# Patient Record
Sex: Female | Born: 1960 | Race: Black or African American | Hispanic: No | Marital: Single | Smoking: Former smoker
Health system: Southern US, Community
[De-identification: ages and names within clinical notes are randomized; demographics above are authoritative.]

## PROBLEM LIST (undated history)

## (undated) DIAGNOSIS — M549 Dorsalgia, unspecified: Secondary | ICD-10-CM

## (undated) DIAGNOSIS — J45909 Unspecified asthma, uncomplicated: Secondary | ICD-10-CM

## (undated) DIAGNOSIS — IMO0001 Reserved for inherently not codable concepts without codable children: Secondary | ICD-10-CM

## (undated) DIAGNOSIS — K219 Gastro-esophageal reflux disease without esophagitis: Secondary | ICD-10-CM

## (undated) DIAGNOSIS — T7840XA Allergy, unspecified, initial encounter: Secondary | ICD-10-CM

## (undated) DIAGNOSIS — I10 Essential (primary) hypertension: Secondary | ICD-10-CM

## (undated) DIAGNOSIS — E2749 Other adrenocortical insufficiency: Secondary | ICD-10-CM

## (undated) DIAGNOSIS — F419 Anxiety disorder, unspecified: Secondary | ICD-10-CM

## (undated) DIAGNOSIS — G43909 Migraine, unspecified, not intractable, without status migrainosus: Secondary | ICD-10-CM

## (undated) DIAGNOSIS — B1920 Unspecified viral hepatitis C without hepatic coma: Secondary | ICD-10-CM

## (undated) DIAGNOSIS — B20 Human immunodeficiency virus [HIV] disease: Secondary | ICD-10-CM

## (undated) DIAGNOSIS — J189 Pneumonia, unspecified organism: Secondary | ICD-10-CM

## (undated) DIAGNOSIS — J309 Allergic rhinitis, unspecified: Secondary | ICD-10-CM

## (undated) DIAGNOSIS — Z8619 Personal history of other infectious and parasitic diseases: Secondary | ICD-10-CM

## (undated) DIAGNOSIS — Z21 Asymptomatic human immunodeficiency virus [HIV] infection status: Secondary | ICD-10-CM

## (undated) DIAGNOSIS — D759 Disease of blood and blood-forming organs, unspecified: Secondary | ICD-10-CM

## (undated) DIAGNOSIS — E785 Hyperlipidemia, unspecified: Secondary | ICD-10-CM

## (undated) DIAGNOSIS — L509 Urticaria, unspecified: Secondary | ICD-10-CM

## (undated) DIAGNOSIS — E271 Primary adrenocortical insufficiency: Secondary | ICD-10-CM

## (undated) DIAGNOSIS — G8929 Other chronic pain: Secondary | ICD-10-CM

## (undated) DIAGNOSIS — M1990 Unspecified osteoarthritis, unspecified site: Secondary | ICD-10-CM

## (undated) DIAGNOSIS — R011 Cardiac murmur, unspecified: Secondary | ICD-10-CM

## (undated) DIAGNOSIS — Z531 Procedure and treatment not carried out because of patient's decision for reasons of belief and group pressure: Secondary | ICD-10-CM

## (undated) DIAGNOSIS — I509 Heart failure, unspecified: Secondary | ICD-10-CM

## (undated) DIAGNOSIS — D352 Benign neoplasm of pituitary gland: Secondary | ICD-10-CM

## (undated) DIAGNOSIS — F32A Depression, unspecified: Secondary | ICD-10-CM

## (undated) DIAGNOSIS — E119 Type 2 diabetes mellitus without complications: Secondary | ICD-10-CM

## (undated) DIAGNOSIS — R7303 Prediabetes: Secondary | ICD-10-CM

## (undated) HISTORY — DX: Cardiac murmur, unspecified: R01.1

## (undated) HISTORY — DX: Anxiety disorder, unspecified: F41.9

## (undated) HISTORY — PX: COLONOSCOPY: SHX174

## (undated) HISTORY — DX: Unspecified asthma, uncomplicated: J45.909

## (undated) HISTORY — DX: Hyperlipidemia, unspecified: E78.5

## (undated) HISTORY — DX: Gastro-esophageal reflux disease without esophagitis: K21.9

## (undated) HISTORY — DX: Asymptomatic human immunodeficiency virus (hiv) infection status: Z21

## (undated) HISTORY — DX: Depression, unspecified: F32.A

## (undated) HISTORY — DX: Essential (primary) hypertension: I10

## (undated) HISTORY — DX: Migraine, unspecified, not intractable, without status migrainosus: G43.909

## (undated) HISTORY — PX: COLECTOMY: SHX59

## (undated) HISTORY — PX: HAND SURGERY: SHX662

## (undated) HISTORY — DX: Other chronic pain: G89.29

## (undated) HISTORY — PX: COLOSTOMY TAKEDOWN: SHX5258

## (undated) HISTORY — DX: Prediabetes: R73.03

## (undated) HISTORY — DX: Type 2 diabetes mellitus without complications: E11.9

## (undated) HISTORY — DX: Personal history of other infectious and parasitic diseases: Z86.19

## (undated) HISTORY — DX: Human immunodeficiency virus (HIV) disease: B20

## (undated) HISTORY — DX: Allergy, unspecified, initial encounter: T78.40XA

## (undated) HISTORY — DX: Dorsalgia, unspecified: M54.9

## (undated) HISTORY — DX: Urticaria, unspecified: L50.9

## (undated) HISTORY — PX: TONSILLECTOMY: SUR1361

## (undated) HISTORY — PX: NASAL SINUS SURGERY: SHX719

## (undated) HISTORY — DX: Unspecified viral hepatitis C without hepatic coma: B19.20

## (undated) HISTORY — DX: Other adrenocortical insufficiency: E27.49

## (undated) HISTORY — PX: BUNIONECTOMY: SHX129

## (undated) HISTORY — DX: Benign neoplasm of pituitary gland: D35.2

## (undated) HISTORY — PX: GANGLION CYST EXCISION: SHX1691

## (undated) HISTORY — PX: SHOULDER SURGERY: SHX246

## (undated) HISTORY — DX: Allergic rhinitis, unspecified: J30.9

---

## 1992-04-25 ENCOUNTER — Encounter (INDEPENDENT_AMBULATORY_CARE_PROVIDER_SITE_OTHER): Payer: Self-pay | Admitting: *Deleted

## 1992-04-25 LAB — CONVERTED CEMR LAB: CD4 T Cell Abs: 200

## 1997-08-21 ENCOUNTER — Other Ambulatory Visit: Admission: RE | Admit: 1997-08-21 | Discharge: 1997-08-21 | Payer: Self-pay | Admitting: Obstetrics

## 1997-08-21 ENCOUNTER — Encounter: Admission: RE | Admit: 1997-08-21 | Discharge: 1997-08-21 | Payer: Self-pay | Admitting: Obstetrics

## 1997-08-27 ENCOUNTER — Encounter: Admission: RE | Admit: 1997-08-27 | Discharge: 1997-08-27 | Payer: Self-pay | Admitting: Hematology and Oncology

## 1997-09-17 ENCOUNTER — Encounter: Admission: RE | Admit: 1997-09-17 | Discharge: 1997-09-17 | Payer: Self-pay | Admitting: Hematology and Oncology

## 1997-09-30 ENCOUNTER — Encounter: Admission: RE | Admit: 1997-09-30 | Discharge: 1997-09-30 | Payer: Self-pay | Admitting: Hematology and Oncology

## 1997-10-07 ENCOUNTER — Encounter: Admission: RE | Admit: 1997-10-07 | Discharge: 1997-10-07 | Payer: Self-pay | Admitting: Hematology and Oncology

## 1997-10-08 ENCOUNTER — Encounter: Admission: RE | Admit: 1997-10-08 | Discharge: 1997-10-08 | Payer: Self-pay | Admitting: Internal Medicine

## 1997-10-15 ENCOUNTER — Encounter: Admission: RE | Admit: 1997-10-15 | Discharge: 1997-10-15 | Payer: Self-pay | Admitting: Hematology and Oncology

## 1997-11-13 ENCOUNTER — Emergency Department (HOSPITAL_COMMUNITY): Admission: EM | Admit: 1997-11-13 | Discharge: 1997-11-13 | Payer: Self-pay | Admitting: Emergency Medicine

## 1997-11-25 ENCOUNTER — Encounter: Admission: RE | Admit: 1997-11-25 | Discharge: 1997-11-25 | Payer: Self-pay | Admitting: Internal Medicine

## 1998-01-07 ENCOUNTER — Encounter: Admission: RE | Admit: 1998-01-07 | Discharge: 1998-01-07 | Payer: Self-pay | Admitting: Hematology and Oncology

## 1998-01-09 ENCOUNTER — Encounter: Admission: RE | Admit: 1998-01-09 | Discharge: 1998-01-09 | Payer: Self-pay | Admitting: Obstetrics & Gynecology

## 1998-04-01 ENCOUNTER — Encounter: Admission: RE | Admit: 1998-04-01 | Discharge: 1998-04-01 | Payer: Self-pay | Admitting: Internal Medicine

## 1998-06-19 ENCOUNTER — Encounter: Admission: RE | Admit: 1998-06-19 | Discharge: 1998-06-19 | Payer: Self-pay | Admitting: Internal Medicine

## 1998-11-10 ENCOUNTER — Encounter: Admission: RE | Admit: 1998-11-10 | Discharge: 1998-11-10 | Payer: Self-pay | Admitting: Obstetrics & Gynecology

## 1998-11-24 ENCOUNTER — Encounter: Admission: RE | Admit: 1998-11-24 | Discharge: 1998-11-24 | Payer: Self-pay | Admitting: Obstetrics & Gynecology

## 1999-03-15 ENCOUNTER — Encounter: Admission: RE | Admit: 1999-03-15 | Discharge: 1999-03-15 | Payer: Self-pay | Admitting: Hematology and Oncology

## 1999-03-15 ENCOUNTER — Ambulatory Visit (HOSPITAL_COMMUNITY): Admission: RE | Admit: 1999-03-15 | Discharge: 1999-03-15 | Payer: Self-pay | Admitting: Hematology and Oncology

## 1999-03-16 ENCOUNTER — Encounter: Admission: RE | Admit: 1999-03-16 | Discharge: 1999-03-16 | Payer: Self-pay | Admitting: Internal Medicine

## 1999-03-29 ENCOUNTER — Encounter: Admission: RE | Admit: 1999-03-29 | Discharge: 1999-03-29 | Payer: Self-pay | Admitting: Internal Medicine

## 1999-04-29 ENCOUNTER — Encounter: Admission: RE | Admit: 1999-04-29 | Discharge: 1999-04-29 | Payer: Self-pay | Admitting: Hematology and Oncology

## 1999-05-14 ENCOUNTER — Encounter: Admission: RE | Admit: 1999-05-14 | Discharge: 1999-05-14 | Payer: Self-pay | Admitting: Internal Medicine

## 1999-09-10 ENCOUNTER — Encounter: Admission: RE | Admit: 1999-09-10 | Discharge: 1999-09-10 | Payer: Self-pay | Admitting: Hematology and Oncology

## 1999-09-13 ENCOUNTER — Ambulatory Visit (HOSPITAL_COMMUNITY): Admission: RE | Admit: 1999-09-13 | Discharge: 1999-09-13 | Payer: Self-pay | Admitting: Internal Medicine

## 1999-09-13 ENCOUNTER — Encounter: Admission: RE | Admit: 1999-09-13 | Discharge: 1999-09-13 | Payer: Self-pay | Admitting: Internal Medicine

## 1999-09-28 ENCOUNTER — Encounter: Admission: RE | Admit: 1999-09-28 | Discharge: 1999-09-28 | Payer: Self-pay | Admitting: Hematology and Oncology

## 1999-11-07 ENCOUNTER — Emergency Department (HOSPITAL_COMMUNITY): Admission: EM | Admit: 1999-11-07 | Discharge: 1999-11-07 | Payer: Self-pay | Admitting: Emergency Medicine

## 1999-11-07 ENCOUNTER — Encounter: Payer: Self-pay | Admitting: Emergency Medicine

## 1999-12-31 ENCOUNTER — Encounter: Admission: RE | Admit: 1999-12-31 | Discharge: 1999-12-31 | Payer: Self-pay | Admitting: Internal Medicine

## 2000-02-24 ENCOUNTER — Ambulatory Visit (HOSPITAL_COMMUNITY): Admission: RE | Admit: 2000-02-24 | Discharge: 2000-02-24 | Payer: Self-pay | Admitting: Hematology and Oncology

## 2000-02-24 ENCOUNTER — Encounter: Admission: RE | Admit: 2000-02-24 | Discharge: 2000-02-24 | Payer: Self-pay | Admitting: Hematology and Oncology

## 2000-03-03 ENCOUNTER — Encounter: Admission: RE | Admit: 2000-03-03 | Discharge: 2000-03-03 | Payer: Self-pay

## 2000-03-28 ENCOUNTER — Ambulatory Visit (HOSPITAL_COMMUNITY): Admission: RE | Admit: 2000-03-28 | Discharge: 2000-03-28 | Payer: Self-pay | Admitting: Hematology and Oncology

## 2000-03-28 ENCOUNTER — Encounter: Admission: RE | Admit: 2000-03-28 | Discharge: 2000-03-28 | Payer: Self-pay | Admitting: Hematology and Oncology

## 2000-04-28 ENCOUNTER — Encounter: Payer: Self-pay | Admitting: Internal Medicine

## 2000-04-28 ENCOUNTER — Inpatient Hospital Stay (HOSPITAL_COMMUNITY): Admission: EM | Admit: 2000-04-28 | Discharge: 2000-05-06 | Payer: Self-pay | Admitting: Emergency Medicine

## 2000-05-01 ENCOUNTER — Encounter: Payer: Self-pay | Admitting: Internal Medicine

## 2000-05-10 ENCOUNTER — Ambulatory Visit (HOSPITAL_COMMUNITY): Admission: RE | Admit: 2000-05-10 | Discharge: 2000-05-10 | Payer: Self-pay | Admitting: Infectious Diseases

## 2000-05-10 ENCOUNTER — Encounter: Admission: RE | Admit: 2000-05-10 | Discharge: 2000-05-10 | Payer: Self-pay | Admitting: Infectious Diseases

## 2000-06-01 ENCOUNTER — Encounter: Admission: RE | Admit: 2000-06-01 | Discharge: 2000-06-01 | Payer: Self-pay | Admitting: Obstetrics

## 2000-06-07 ENCOUNTER — Ambulatory Visit (HOSPITAL_COMMUNITY): Admission: RE | Admit: 2000-06-07 | Discharge: 2000-06-07 | Payer: Self-pay | Admitting: Infectious Diseases

## 2000-06-07 ENCOUNTER — Encounter: Admission: RE | Admit: 2000-06-07 | Discharge: 2000-06-07 | Payer: Self-pay | Admitting: Infectious Diseases

## 2000-06-22 ENCOUNTER — Encounter: Admission: RE | Admit: 2000-06-22 | Discharge: 2000-06-22 | Payer: Self-pay | Admitting: Internal Medicine

## 2000-06-27 ENCOUNTER — Encounter: Admission: RE | Admit: 2000-06-27 | Discharge: 2000-06-27 | Payer: Self-pay | Admitting: Obstetrics & Gynecology

## 2000-07-05 ENCOUNTER — Encounter: Admission: RE | Admit: 2000-07-05 | Discharge: 2000-07-05 | Payer: Self-pay | Admitting: Infectious Diseases

## 2000-09-07 ENCOUNTER — Emergency Department (HOSPITAL_COMMUNITY): Admission: EM | Admit: 2000-09-07 | Discharge: 2000-09-07 | Payer: Self-pay | Admitting: Emergency Medicine

## 2000-09-28 ENCOUNTER — Encounter: Admission: RE | Admit: 2000-09-28 | Discharge: 2000-09-28 | Payer: Self-pay | Admitting: Obstetrics

## 2000-10-07 ENCOUNTER — Encounter: Payer: Self-pay | Admitting: Emergency Medicine

## 2000-10-07 ENCOUNTER — Emergency Department (HOSPITAL_COMMUNITY): Admission: EM | Admit: 2000-10-07 | Discharge: 2000-10-07 | Payer: Self-pay | Admitting: Emergency Medicine

## 2000-11-05 ENCOUNTER — Encounter: Payer: Self-pay | Admitting: Emergency Medicine

## 2000-11-05 ENCOUNTER — Emergency Department (HOSPITAL_COMMUNITY): Admission: EM | Admit: 2000-11-05 | Discharge: 2000-11-05 | Payer: Self-pay | Admitting: Emergency Medicine

## 2000-11-29 ENCOUNTER — Encounter: Admission: RE | Admit: 2000-11-29 | Discharge: 2000-11-29 | Payer: Self-pay | Admitting: Infectious Diseases

## 2000-11-29 ENCOUNTER — Ambulatory Visit (HOSPITAL_COMMUNITY): Admission: RE | Admit: 2000-11-29 | Discharge: 2000-11-29 | Payer: Self-pay | Admitting: Infectious Diseases

## 2000-12-29 ENCOUNTER — Encounter: Admission: RE | Admit: 2000-12-29 | Discharge: 2000-12-29 | Payer: Self-pay | Admitting: Obstetrics

## 2001-02-05 ENCOUNTER — Encounter: Payer: Self-pay | Admitting: Emergency Medicine

## 2001-02-05 ENCOUNTER — Inpatient Hospital Stay (HOSPITAL_COMMUNITY): Admission: EM | Admit: 2001-02-05 | Discharge: 2001-02-13 | Payer: Self-pay | Admitting: Emergency Medicine

## 2001-02-06 ENCOUNTER — Encounter: Payer: Self-pay | Admitting: Internal Medicine

## 2001-02-21 ENCOUNTER — Encounter: Payer: Self-pay | Admitting: Infectious Diseases

## 2001-02-21 ENCOUNTER — Encounter: Admission: RE | Admit: 2001-02-21 | Discharge: 2001-02-21 | Payer: Self-pay | Admitting: Infectious Diseases

## 2001-02-21 ENCOUNTER — Ambulatory Visit (HOSPITAL_COMMUNITY): Admission: RE | Admit: 2001-02-21 | Discharge: 2001-02-21 | Payer: Self-pay | Admitting: Infectious Diseases

## 2001-03-30 ENCOUNTER — Encounter: Admission: RE | Admit: 2001-03-30 | Discharge: 2001-03-30 | Payer: Self-pay | Admitting: Infectious Diseases

## 2001-04-25 HISTORY — PX: COLECTOMY: SHX59

## 2001-04-27 ENCOUNTER — Encounter: Admission: RE | Admit: 2001-04-27 | Discharge: 2001-04-27 | Payer: Self-pay | Admitting: Infectious Diseases

## 2001-06-14 ENCOUNTER — Inpatient Hospital Stay (HOSPITAL_COMMUNITY): Admission: RE | Admit: 2001-06-14 | Discharge: 2001-06-22 | Payer: Self-pay | Admitting: Surgery

## 2001-06-14 ENCOUNTER — Encounter (INDEPENDENT_AMBULATORY_CARE_PROVIDER_SITE_OTHER): Payer: Self-pay | Admitting: Specialist

## 2001-09-05 ENCOUNTER — Encounter: Admission: RE | Admit: 2001-09-05 | Discharge: 2001-09-05 | Payer: Self-pay | Admitting: Infectious Diseases

## 2001-09-05 ENCOUNTER — Ambulatory Visit (HOSPITAL_COMMUNITY): Admission: RE | Admit: 2001-09-05 | Discharge: 2001-09-05 | Payer: Self-pay | Admitting: Infectious Diseases

## 2001-12-12 ENCOUNTER — Encounter: Admission: RE | Admit: 2001-12-12 | Discharge: 2001-12-12 | Payer: Self-pay | Admitting: Infectious Diseases

## 2001-12-14 ENCOUNTER — Encounter: Admission: RE | Admit: 2001-12-14 | Discharge: 2001-12-14 | Payer: Self-pay | Admitting: *Deleted

## 2001-12-18 ENCOUNTER — Encounter: Admission: RE | Admit: 2001-12-18 | Discharge: 2001-12-18 | Payer: Self-pay | Admitting: Infectious Diseases

## 2001-12-18 ENCOUNTER — Ambulatory Visit (HOSPITAL_COMMUNITY): Admission: RE | Admit: 2001-12-18 | Discharge: 2001-12-18 | Payer: Self-pay | Admitting: Infectious Diseases

## 2001-12-20 ENCOUNTER — Ambulatory Visit (HOSPITAL_COMMUNITY): Admission: RE | Admit: 2001-12-20 | Discharge: 2001-12-20 | Payer: Self-pay | Admitting: *Deleted

## 2002-02-22 ENCOUNTER — Encounter: Admission: RE | Admit: 2002-02-22 | Discharge: 2002-02-22 | Payer: Self-pay | Admitting: Internal Medicine

## 2002-03-11 ENCOUNTER — Ambulatory Visit (HOSPITAL_COMMUNITY): Admission: RE | Admit: 2002-03-11 | Discharge: 2002-03-11 | Payer: Self-pay | Admitting: Infectious Diseases

## 2002-03-11 ENCOUNTER — Encounter: Payer: Self-pay | Admitting: Infectious Diseases

## 2002-03-11 ENCOUNTER — Encounter: Admission: RE | Admit: 2002-03-11 | Discharge: 2002-03-11 | Payer: Self-pay | Admitting: Infectious Diseases

## 2002-03-15 ENCOUNTER — Encounter: Admission: RE | Admit: 2002-03-15 | Discharge: 2002-03-15 | Payer: Self-pay | Admitting: *Deleted

## 2002-06-05 ENCOUNTER — Encounter: Admission: RE | Admit: 2002-06-05 | Discharge: 2002-06-05 | Payer: Self-pay | Admitting: Internal Medicine

## 2002-08-08 ENCOUNTER — Encounter: Admission: RE | Admit: 2002-08-08 | Discharge: 2002-08-08 | Payer: Self-pay | Admitting: Infectious Diseases

## 2002-08-08 ENCOUNTER — Encounter: Payer: Self-pay | Admitting: Infectious Diseases

## 2002-08-29 ENCOUNTER — Encounter: Admission: RE | Admit: 2002-08-29 | Discharge: 2002-08-29 | Payer: Self-pay | Admitting: Infectious Diseases

## 2002-08-29 ENCOUNTER — Encounter: Payer: Self-pay | Admitting: Infectious Diseases

## 2002-11-04 ENCOUNTER — Encounter: Payer: Self-pay | Admitting: Infectious Diseases

## 2002-11-04 ENCOUNTER — Encounter: Admission: RE | Admit: 2002-11-04 | Discharge: 2002-11-04 | Payer: Self-pay | Admitting: Infectious Diseases

## 2002-11-04 ENCOUNTER — Ambulatory Visit (HOSPITAL_COMMUNITY): Admission: RE | Admit: 2002-11-04 | Discharge: 2002-11-04 | Payer: Self-pay | Admitting: Infectious Diseases

## 2002-11-20 ENCOUNTER — Encounter: Admission: RE | Admit: 2002-11-20 | Discharge: 2002-11-20 | Payer: Self-pay | Admitting: Internal Medicine

## 2003-02-03 ENCOUNTER — Encounter: Admission: RE | Admit: 2003-02-03 | Discharge: 2003-02-03 | Payer: Self-pay | Admitting: Infectious Diseases

## 2003-04-07 ENCOUNTER — Encounter: Payer: Self-pay | Admitting: Infectious Diseases

## 2003-04-07 ENCOUNTER — Ambulatory Visit (HOSPITAL_COMMUNITY): Admission: RE | Admit: 2003-04-07 | Discharge: 2003-04-07 | Payer: Self-pay | Admitting: Infectious Diseases

## 2003-04-07 ENCOUNTER — Encounter: Admission: RE | Admit: 2003-04-07 | Discharge: 2003-04-07 | Payer: Self-pay | Admitting: Infectious Diseases

## 2003-05-05 ENCOUNTER — Encounter: Admission: RE | Admit: 2003-05-05 | Discharge: 2003-05-05 | Payer: Self-pay | Admitting: Infectious Diseases

## 2003-06-11 ENCOUNTER — Encounter: Admission: RE | Admit: 2003-06-11 | Discharge: 2003-06-11 | Payer: Self-pay | Admitting: Infectious Diseases

## 2003-06-11 ENCOUNTER — Ambulatory Visit (HOSPITAL_COMMUNITY): Admission: RE | Admit: 2003-06-11 | Discharge: 2003-06-11 | Payer: Self-pay | Admitting: Infectious Diseases

## 2003-07-07 ENCOUNTER — Encounter: Admission: RE | Admit: 2003-07-07 | Discharge: 2003-07-07 | Payer: Self-pay | Admitting: Infectious Diseases

## 2003-08-25 ENCOUNTER — Ambulatory Visit (HOSPITAL_COMMUNITY): Admission: RE | Admit: 2003-08-25 | Discharge: 2003-08-25 | Payer: Self-pay | Admitting: Infectious Diseases

## 2003-08-25 ENCOUNTER — Encounter: Admission: RE | Admit: 2003-08-25 | Discharge: 2003-08-25 | Payer: Self-pay | Admitting: Infectious Diseases

## 2003-10-10 ENCOUNTER — Encounter: Admission: RE | Admit: 2003-10-10 | Discharge: 2003-10-10 | Payer: Self-pay | Admitting: Infectious Diseases

## 2003-11-03 ENCOUNTER — Ambulatory Visit (HOSPITAL_COMMUNITY): Admission: RE | Admit: 2003-11-03 | Discharge: 2003-11-03 | Payer: Self-pay | Admitting: Infectious Diseases

## 2003-11-03 ENCOUNTER — Encounter: Admission: RE | Admit: 2003-11-03 | Discharge: 2003-11-03 | Payer: Self-pay | Admitting: Infectious Diseases

## 2003-11-05 ENCOUNTER — Ambulatory Visit (HOSPITAL_COMMUNITY): Admission: RE | Admit: 2003-11-05 | Discharge: 2003-11-05 | Payer: Self-pay | Admitting: Infectious Diseases

## 2003-12-11 ENCOUNTER — Encounter: Admission: RE | Admit: 2003-12-11 | Discharge: 2003-12-11 | Payer: Self-pay | Admitting: Infectious Diseases

## 2004-01-05 ENCOUNTER — Ambulatory Visit: Payer: Self-pay | Admitting: Infectious Diseases

## 2004-01-14 ENCOUNTER — Ambulatory Visit: Payer: Self-pay | Admitting: Infectious Diseases

## 2004-02-23 ENCOUNTER — Ambulatory Visit (HOSPITAL_COMMUNITY): Admission: RE | Admit: 2004-02-23 | Discharge: 2004-02-23 | Payer: Self-pay | Admitting: Infectious Diseases

## 2004-02-23 ENCOUNTER — Ambulatory Visit: Payer: Self-pay | Admitting: Infectious Diseases

## 2004-04-02 ENCOUNTER — Ambulatory Visit: Payer: Self-pay | Admitting: Internal Medicine

## 2004-04-05 ENCOUNTER — Ambulatory Visit: Payer: Self-pay | Admitting: Infectious Diseases

## 2004-05-03 ENCOUNTER — Ambulatory Visit: Payer: Self-pay | Admitting: Infectious Diseases

## 2004-05-03 ENCOUNTER — Ambulatory Visit (HOSPITAL_COMMUNITY): Admission: RE | Admit: 2004-05-03 | Discharge: 2004-05-03 | Payer: Self-pay | Admitting: Infectious Diseases

## 2004-06-11 ENCOUNTER — Emergency Department (HOSPITAL_COMMUNITY): Admission: EM | Admit: 2004-06-11 | Discharge: 2004-06-11 | Payer: Self-pay | Admitting: Family Medicine

## 2004-06-21 ENCOUNTER — Ambulatory Visit: Payer: Self-pay | Admitting: Infectious Diseases

## 2004-07-06 ENCOUNTER — Ambulatory Visit: Payer: Self-pay | Admitting: Internal Medicine

## 2004-07-19 ENCOUNTER — Ambulatory Visit (HOSPITAL_COMMUNITY): Admission: RE | Admit: 2004-07-19 | Discharge: 2004-07-19 | Payer: Self-pay | Admitting: Unknown Physician Specialty

## 2004-08-05 ENCOUNTER — Emergency Department (HOSPITAL_COMMUNITY): Admission: EM | Admit: 2004-08-05 | Discharge: 2004-08-05 | Payer: Self-pay | Admitting: Family Medicine

## 2004-08-09 ENCOUNTER — Ambulatory Visit: Payer: Self-pay | Admitting: Internal Medicine

## 2004-09-08 ENCOUNTER — Ambulatory Visit (HOSPITAL_COMMUNITY): Admission: RE | Admit: 2004-09-08 | Discharge: 2004-09-08 | Payer: Self-pay | Admitting: Infectious Diseases

## 2004-09-08 ENCOUNTER — Ambulatory Visit: Payer: Self-pay | Admitting: Infectious Diseases

## 2004-11-19 ENCOUNTER — Ambulatory Visit: Payer: Self-pay | Admitting: Infectious Diseases

## 2005-01-05 ENCOUNTER — Ambulatory Visit: Payer: Self-pay | Admitting: Infectious Diseases

## 2005-01-05 ENCOUNTER — Ambulatory Visit (HOSPITAL_COMMUNITY): Admission: RE | Admit: 2005-01-05 | Discharge: 2005-01-05 | Payer: Self-pay | Admitting: Infectious Diseases

## 2005-03-07 ENCOUNTER — Encounter: Payer: Self-pay | Admitting: Infectious Diseases

## 2005-03-07 ENCOUNTER — Other Ambulatory Visit: Admission: RE | Admit: 2005-03-07 | Discharge: 2005-03-07 | Payer: Self-pay | Admitting: Infectious Diseases

## 2005-03-07 ENCOUNTER — Ambulatory Visit: Payer: Self-pay | Admitting: Infectious Diseases

## 2005-04-28 ENCOUNTER — Emergency Department (HOSPITAL_COMMUNITY): Admission: EM | Admit: 2005-04-28 | Discharge: 2005-04-28 | Payer: Self-pay | Admitting: Emergency Medicine

## 2005-04-29 ENCOUNTER — Emergency Department (HOSPITAL_COMMUNITY): Admission: EM | Admit: 2005-04-29 | Discharge: 2005-04-29 | Payer: Self-pay | Admitting: Emergency Medicine

## 2005-05-02 ENCOUNTER — Ambulatory Visit: Payer: Self-pay | Admitting: Infectious Diseases

## 2005-05-10 ENCOUNTER — Encounter (INDEPENDENT_AMBULATORY_CARE_PROVIDER_SITE_OTHER): Payer: Self-pay | Admitting: *Deleted

## 2005-05-10 ENCOUNTER — Ambulatory Visit (HOSPITAL_COMMUNITY): Admission: RE | Admit: 2005-05-10 | Discharge: 2005-05-10 | Payer: Self-pay | Admitting: Infectious Diseases

## 2005-05-10 ENCOUNTER — Ambulatory Visit: Payer: Self-pay | Admitting: Infectious Diseases

## 2005-05-10 LAB — CONVERTED CEMR LAB
CD4 Count: 500 microliters
HIV 1 RNA Quant: 399 copies/mL

## 2005-07-27 ENCOUNTER — Encounter: Admission: RE | Admit: 2005-07-27 | Discharge: 2005-07-27 | Payer: Self-pay | Admitting: Infectious Diseases

## 2005-07-27 ENCOUNTER — Encounter (INDEPENDENT_AMBULATORY_CARE_PROVIDER_SITE_OTHER): Payer: Self-pay | Admitting: *Deleted

## 2005-07-27 ENCOUNTER — Ambulatory Visit: Payer: Self-pay | Admitting: Infectious Diseases

## 2005-08-03 ENCOUNTER — Ambulatory Visit (HOSPITAL_COMMUNITY): Admission: RE | Admit: 2005-08-03 | Discharge: 2005-08-03 | Payer: Self-pay | Admitting: Infectious Diseases

## 2005-08-05 ENCOUNTER — Ambulatory Visit: Payer: Self-pay

## 2005-08-31 ENCOUNTER — Ambulatory Visit: Payer: Self-pay | Admitting: Infectious Diseases

## 2005-09-12 ENCOUNTER — Ambulatory Visit: Payer: Self-pay | Admitting: Cardiovascular Disease

## 2005-10-03 ENCOUNTER — Ambulatory Visit: Payer: Self-pay | Admitting: Cardiology

## 2005-10-05 ENCOUNTER — Ambulatory Visit: Payer: Self-pay | Admitting: Infectious Diseases

## 2005-11-07 ENCOUNTER — Ambulatory Visit: Payer: Self-pay | Admitting: Gastroenterology

## 2005-11-30 ENCOUNTER — Encounter: Admission: RE | Admit: 2005-11-30 | Discharge: 2005-11-30 | Payer: Self-pay | Admitting: Infectious Diseases

## 2005-11-30 ENCOUNTER — Ambulatory Visit: Payer: Self-pay | Admitting: Infectious Diseases

## 2005-12-01 ENCOUNTER — Ambulatory Visit: Payer: Self-pay | Admitting: Gastroenterology

## 2005-12-01 ENCOUNTER — Encounter (INDEPENDENT_AMBULATORY_CARE_PROVIDER_SITE_OTHER): Payer: Self-pay | Admitting: Specialist

## 2005-12-21 ENCOUNTER — Ambulatory Visit: Payer: Self-pay | Admitting: Gastroenterology

## 2005-12-30 ENCOUNTER — Ambulatory Visit: Payer: Self-pay | Admitting: Internal Medicine

## 2006-02-09 ENCOUNTER — Ambulatory Visit: Payer: Self-pay | Admitting: Infectious Diseases

## 2006-03-15 ENCOUNTER — Encounter: Admission: RE | Admit: 2006-03-15 | Discharge: 2006-03-15 | Payer: Self-pay | Admitting: Infectious Diseases

## 2006-03-15 ENCOUNTER — Encounter (INDEPENDENT_AMBULATORY_CARE_PROVIDER_SITE_OTHER): Payer: Self-pay | Admitting: *Deleted

## 2006-03-15 ENCOUNTER — Ambulatory Visit (HOSPITAL_COMMUNITY): Admission: RE | Admit: 2006-03-15 | Discharge: 2006-03-15 | Payer: Self-pay | Admitting: Infectious Diseases

## 2006-03-15 ENCOUNTER — Ambulatory Visit: Payer: Self-pay | Admitting: Infectious Diseases

## 2006-03-24 ENCOUNTER — Ambulatory Visit (HOSPITAL_COMMUNITY): Admission: RE | Admit: 2006-03-24 | Discharge: 2006-03-24 | Payer: Self-pay | Admitting: Internal Medicine

## 2006-04-13 ENCOUNTER — Ambulatory Visit: Payer: Self-pay | Admitting: Cardiovascular Disease

## 2006-05-08 ENCOUNTER — Ambulatory Visit: Payer: Self-pay | Admitting: Infectious Diseases

## 2006-05-09 DIAGNOSIS — J309 Allergic rhinitis, unspecified: Secondary | ICD-10-CM

## 2006-05-09 DIAGNOSIS — F32A Depression, unspecified: Secondary | ICD-10-CM | POA: Insufficient documentation

## 2006-05-09 DIAGNOSIS — B1710 Acute hepatitis C without hepatic coma: Secondary | ICD-10-CM

## 2006-05-09 DIAGNOSIS — B20 Human immunodeficiency virus [HIV] disease: Secondary | ICD-10-CM

## 2006-05-09 DIAGNOSIS — K219 Gastro-esophageal reflux disease without esophagitis: Secondary | ICD-10-CM | POA: Insufficient documentation

## 2006-05-09 DIAGNOSIS — F329 Major depressive disorder, single episode, unspecified: Secondary | ICD-10-CM

## 2006-05-09 HISTORY — DX: Allergic rhinitis, unspecified: J30.9

## 2006-06-19 ENCOUNTER — Encounter (INDEPENDENT_AMBULATORY_CARE_PROVIDER_SITE_OTHER): Payer: Self-pay | Admitting: *Deleted

## 2006-06-19 LAB — CONVERTED CEMR LAB

## 2006-06-27 ENCOUNTER — Telehealth: Payer: Self-pay | Admitting: Infectious Diseases

## 2006-06-29 ENCOUNTER — Telehealth (INDEPENDENT_AMBULATORY_CARE_PROVIDER_SITE_OTHER): Payer: Self-pay | Admitting: *Deleted

## 2006-07-02 ENCOUNTER — Encounter (INDEPENDENT_AMBULATORY_CARE_PROVIDER_SITE_OTHER): Payer: Self-pay | Admitting: *Deleted

## 2006-08-07 ENCOUNTER — Encounter: Admission: RE | Admit: 2006-08-07 | Discharge: 2006-08-07 | Payer: Self-pay | Admitting: Infectious Diseases

## 2006-08-07 ENCOUNTER — Ambulatory Visit: Payer: Self-pay | Admitting: Infectious Diseases

## 2006-08-07 ENCOUNTER — Encounter: Payer: Self-pay | Admitting: Internal Medicine

## 2006-08-07 LAB — CONVERTED CEMR LAB
AST: 29 units/L (ref 0–37)
Albumin: 4.3 g/dL (ref 3.5–5.2)
Alkaline Phosphatase: 78 units/L (ref 39–117)
Beta hcg, urine, semiquantitative: NEGATIVE
CD4 Count: 410 microliters
Calcium: 9 mg/dL (ref 8.4–10.5)
Chloride: 103 meq/L (ref 96–112)
Glucose, Bld: 105 mg/dL — ABNORMAL HIGH (ref 70–99)
HIV-1 RNA Quant, Log: 2.45 — ABNORMAL HIGH (ref <1.70)
Hemoglobin: 13.3 g/dL (ref 12.0–15.0)
LDL Cholesterol: 147 mg/dL — ABNORMAL HIGH (ref 0–99)
MCHC: 30.9 g/dL (ref 30.0–36.0)
Potassium: 3.4 meq/L — ABNORMAL LOW (ref 3.5–5.3)
RBC: 5.26 M/uL — ABNORMAL HIGH (ref 3.87–5.11)
RDW: 16.4 % — ABNORMAL HIGH (ref 11.5–14.0)
Sodium: 140 meq/L (ref 135–145)
Total Protein: 7.9 g/dL (ref 6.0–8.3)

## 2006-09-29 ENCOUNTER — Telehealth: Payer: Self-pay | Admitting: Infectious Diseases

## 2006-10-23 ENCOUNTER — Telehealth: Payer: Self-pay | Admitting: Infectious Diseases

## 2006-10-25 ENCOUNTER — Telehealth: Payer: Self-pay | Admitting: Infectious Diseases

## 2006-12-28 ENCOUNTER — Telehealth: Payer: Self-pay | Admitting: Infectious Diseases

## 2007-01-03 ENCOUNTER — Telehealth: Payer: Self-pay | Admitting: Infectious Diseases

## 2007-01-05 ENCOUNTER — Encounter: Admission: RE | Admit: 2007-01-05 | Discharge: 2007-01-05 | Payer: Self-pay | Admitting: Internal Medicine

## 2007-01-05 ENCOUNTER — Ambulatory Visit: Payer: Self-pay | Admitting: Internal Medicine

## 2007-01-05 ENCOUNTER — Encounter: Payer: Self-pay | Admitting: Infectious Diseases

## 2007-01-05 LAB — CONVERTED CEMR LAB: HIV 1 RNA Quant: 66 copies/mL — ABNORMAL HIGH (ref <50)

## 2007-01-08 LAB — CONVERTED CEMR LAB
ALT: 41 units/L — ABNORMAL HIGH (ref 0–35)
Albumin: 3.9 g/dL (ref 3.5–5.2)
Basophils Absolute: 0 10*3/uL (ref 0.0–0.1)
CO2: 22 meq/L (ref 19–32)
Chloride: 106 meq/L (ref 96–112)
Eosinophils Relative: 1 % (ref 0–5)
Hemoglobin, Urine: NEGATIVE
Lymphocytes Relative: 37 % (ref 12–46)
Neutro Abs: 4.1 10*3/uL (ref 1.7–7.7)
Neutrophils Relative %: 55 % (ref 43–77)
Platelets: 188 10*3/uL (ref 150–400)
Potassium: 3.7 meq/L (ref 3.5–5.3)
Protein, ur: NEGATIVE mg/dL
RDW: 16.8 % — ABNORMAL HIGH (ref 11.5–14.0)
Sodium: 139 meq/L (ref 135–145)
Total Bilirubin: 0.3 mg/dL (ref 0.3–1.2)
Total Protein: 7.4 g/dL (ref 6.0–8.3)
Urine Glucose: NEGATIVE mg/dL
Urobilinogen, UA: 0.2 (ref 0.0–1.0)
WBC: 7.5 10*3/uL (ref 4.0–10.5)

## 2007-01-12 ENCOUNTER — Ambulatory Visit: Payer: Self-pay | Admitting: Gastroenterology

## 2007-01-25 ENCOUNTER — Ambulatory Visit: Payer: Self-pay | Admitting: Gastroenterology

## 2007-01-25 LAB — CONVERTED CEMR LAB
ALT: 44 units/L — ABNORMAL HIGH (ref 0–35)
AST: 46 units/L — ABNORMAL HIGH (ref 0–37)
Amylase: 136 units/L — ABNORMAL HIGH (ref 27–131)
Calcium: 8.2 mg/dL — ABNORMAL LOW (ref 8.4–10.5)
Chloride: 105 meq/L (ref 96–112)
Eosinophils Relative: 2.7 % (ref 0.0–5.0)
GFR calc Af Amer: 100 mL/min
HCT: 34 % — ABNORMAL LOW (ref 36.0–46.0)
Lipase: 39 units/L (ref 11.0–59.0)
MCV: 81.9 fL (ref 78.0–100.0)
Neutrophils Relative %: 48.4 % (ref 43.0–77.0)
Potassium: 3.1 meq/L — ABNORMAL LOW (ref 3.5–5.1)
RBC: 4.15 M/uL (ref 3.87–5.11)
RDW: 14.5 % (ref 11.5–14.6)
WBC: 7.2 10*3/uL (ref 4.5–10.5)

## 2007-01-26 ENCOUNTER — Encounter (INDEPENDENT_AMBULATORY_CARE_PROVIDER_SITE_OTHER): Payer: Self-pay | Admitting: *Deleted

## 2007-02-14 ENCOUNTER — Ambulatory Visit: Payer: Self-pay | Admitting: Infectious Diseases

## 2007-02-14 DIAGNOSIS — G4700 Insomnia, unspecified: Secondary | ICD-10-CM | POA: Insufficient documentation

## 2007-03-27 ENCOUNTER — Ambulatory Visit (HOSPITAL_COMMUNITY): Admission: RE | Admit: 2007-03-27 | Discharge: 2007-03-27 | Payer: Self-pay | Admitting: Infectious Diseases

## 2007-03-27 ENCOUNTER — Telehealth: Payer: Self-pay | Admitting: Infectious Diseases

## 2007-04-23 ENCOUNTER — Encounter (INDEPENDENT_AMBULATORY_CARE_PROVIDER_SITE_OTHER): Payer: Self-pay | Admitting: *Deleted

## 2007-04-24 ENCOUNTER — Encounter (INDEPENDENT_AMBULATORY_CARE_PROVIDER_SITE_OTHER): Payer: Self-pay | Admitting: *Deleted

## 2007-05-07 ENCOUNTER — Ambulatory Visit: Payer: Self-pay | Admitting: Infectious Diseases

## 2007-05-07 ENCOUNTER — Encounter: Admission: RE | Admit: 2007-05-07 | Discharge: 2007-05-07 | Payer: Self-pay | Admitting: Infectious Diseases

## 2007-05-07 LAB — CONVERTED CEMR LAB
ALT: 22 units/L (ref 0–35)
AST: 23 units/L (ref 0–37)
Calcium: 9.8 mg/dL (ref 8.4–10.5)
Chloride: 101 meq/L (ref 96–112)
Creatinine, Ser: 0.79 mg/dL (ref 0.40–1.20)
HCT: 40.6 % (ref 36.0–46.0)
HIV 1 RNA Quant: <50 copies/mL (ref <50)
LDL Cholesterol: 127 mg/dL — ABNORMAL HIGH (ref 0–99)
Platelets: 195 10*3/uL (ref 150–400)
Potassium: 3.5 meq/L (ref 3.5–5.3)
RDW: 16 % — ABNORMAL HIGH (ref 11.5–15.5)
Total CHOL/HDL Ratio: 4.6

## 2007-05-08 ENCOUNTER — Encounter: Payer: Self-pay | Admitting: Infectious Diseases

## 2007-08-16 ENCOUNTER — Telehealth: Payer: Self-pay | Admitting: Infectious Diseases

## 2007-08-20 ENCOUNTER — Encounter: Payer: Self-pay | Admitting: Infectious Diseases

## 2007-08-20 ENCOUNTER — Encounter: Admission: RE | Admit: 2007-08-20 | Discharge: 2007-08-20 | Payer: Self-pay | Admitting: Infectious Diseases

## 2007-08-20 ENCOUNTER — Ambulatory Visit: Payer: Self-pay | Admitting: Hospitalist

## 2007-08-20 ENCOUNTER — Telehealth: Payer: Self-pay | Admitting: *Deleted

## 2007-08-20 ENCOUNTER — Ambulatory Visit (HOSPITAL_COMMUNITY): Admission: RE | Admit: 2007-08-20 | Discharge: 2007-08-20 | Payer: Self-pay | Admitting: Hospitalist

## 2007-08-20 DIAGNOSIS — J069 Acute upper respiratory infection, unspecified: Secondary | ICD-10-CM | POA: Insufficient documentation

## 2007-08-20 LAB — CONVERTED CEMR LAB
ALT: 31 units/L (ref 0–35)
AST: 35 units/L (ref 0–37)
Alkaline Phosphatase: 66 units/L (ref 39–117)
Basophils Absolute: 0.1 10*3/uL (ref 0.0–0.1)
Basophils Relative: 1 % (ref 0–1)
Creatinine, Ser: 0.73 mg/dL (ref 0.40–1.20)
Eosinophils Absolute: 0.2 10*3/uL (ref 0.0–0.7)
Lymphs Abs: 2.2 10*3/uL (ref 0.7–4.0)
MCHC: 33.7 g/dL (ref 30.0–36.0)
MCV: 79.6 fL (ref 78.0–100.0)
Neutrophils Relative %: 52 % (ref 43–77)
Platelets: 157 10*3/uL (ref 150–400)
RDW: 15.6 % — ABNORMAL HIGH (ref 11.5–15.5)
Sodium: 137 meq/L (ref 135–145)
Total Bilirubin: 0.3 mg/dL (ref 0.3–1.2)
Total Protein: 8.4 g/dL — ABNORMAL HIGH (ref 6.0–8.3)
WBC: 6.4 10*3/uL (ref 4.0–10.5)

## 2007-08-22 ENCOUNTER — Telehealth (INDEPENDENT_AMBULATORY_CARE_PROVIDER_SITE_OTHER): Payer: Self-pay | Admitting: *Deleted

## 2007-08-23 ENCOUNTER — Encounter: Payer: Self-pay | Admitting: Infectious Diseases

## 2007-09-01 ENCOUNTER — Observation Stay (HOSPITAL_COMMUNITY): Admission: EM | Admit: 2007-09-01 | Discharge: 2007-09-02 | Payer: Self-pay | Admitting: Emergency Medicine

## 2007-09-01 ENCOUNTER — Ambulatory Visit: Payer: Self-pay | Admitting: *Deleted

## 2007-09-01 ENCOUNTER — Ambulatory Visit: Payer: Self-pay | Admitting: Internal Medicine

## 2007-09-01 ENCOUNTER — Encounter (INDEPENDENT_AMBULATORY_CARE_PROVIDER_SITE_OTHER): Payer: Self-pay | Admitting: Emergency Medicine

## 2007-09-03 ENCOUNTER — Telehealth: Payer: Self-pay | Admitting: Infectious Diseases

## 2007-09-05 ENCOUNTER — Ambulatory Visit: Payer: Self-pay | Admitting: Infectious Diseases

## 2007-10-10 ENCOUNTER — Ambulatory Visit: Payer: Self-pay | Admitting: Infectious Diseases

## 2007-10-10 ENCOUNTER — Telehealth: Payer: Self-pay | Admitting: Infectious Disease

## 2007-10-29 ENCOUNTER — Telehealth: Payer: Self-pay | Admitting: Infectious Diseases

## 2007-10-31 ENCOUNTER — Encounter: Payer: Self-pay | Admitting: Infectious Diseases

## 2007-11-08 ENCOUNTER — Ambulatory Visit: Payer: Self-pay | Admitting: *Deleted

## 2007-11-08 ENCOUNTER — Encounter (INDEPENDENT_AMBULATORY_CARE_PROVIDER_SITE_OTHER): Payer: Self-pay | Admitting: *Deleted

## 2007-11-08 LAB — CONVERTED CEMR LAB
Albumin: 4.3 g/dL (ref 3.5–5.2)
BUN: 8 mg/dL (ref 6–23)
CO2: 24 meq/L (ref 19–32)
Calcium: 8.8 mg/dL (ref 8.4–10.5)
Chloride: 103 meq/L (ref 96–112)
Creatinine, Ser: 0.73 mg/dL (ref 0.40–1.20)
Potassium: 3.5 meq/L (ref 3.5–5.3)

## 2007-11-14 ENCOUNTER — Ambulatory Visit: Payer: Self-pay | Admitting: Infectious Diseases

## 2007-11-14 ENCOUNTER — Encounter (INDEPENDENT_AMBULATORY_CARE_PROVIDER_SITE_OTHER): Payer: Self-pay | Admitting: *Deleted

## 2007-11-14 LAB — CONVERTED CEMR LAB
BUN: 9 mg/dL (ref 6–23)
CO2: 23 meq/L (ref 19–32)
Calcium: 8.9 mg/dL (ref 8.4–10.5)
Chloride: 101 meq/L (ref 96–112)
Creatinine, Ser: 0.62 mg/dL (ref 0.40–1.20)
Glucose, Bld: 98 mg/dL (ref 70–99)
Potassium: 3.4 meq/L — ABNORMAL LOW (ref 3.5–5.3)
Sodium: 137 meq/L (ref 135–145)

## 2007-11-28 ENCOUNTER — Ambulatory Visit: Payer: Self-pay | Admitting: Infectious Diseases

## 2007-11-28 ENCOUNTER — Encounter: Admission: RE | Admit: 2007-11-28 | Discharge: 2007-11-28 | Payer: Self-pay | Admitting: Infectious Diseases

## 2007-11-28 LAB — CONVERTED CEMR LAB
BUN: 10 mg/dL (ref 6–23)
Basophils Absolute: 0 10*3/uL (ref 0.0–0.1)
CO2: 25 meq/L (ref 19–32)
Calcium: 8.7 mg/dL (ref 8.4–10.5)
Chloride: 104 meq/L (ref 96–112)
Creatinine, Ser: 0.58 mg/dL (ref 0.40–1.20)
Eosinophils Relative: 3 % (ref 0–5)
Glucose, Bld: 100 mg/dL — ABNORMAL HIGH (ref 70–99)
HCT: 39.6 % (ref 36.0–46.0)
HIV 1 RNA Quant: <50 copies/mL (ref <50)
HIV-1 RNA Quant, Log: <1.7 (ref <1.70)
Hemoglobin: 12.7 g/dL (ref 12.0–15.0)
Lymphocytes Relative: 43 % (ref 12–46)
Monocytes Absolute: 0.6 10*3/uL (ref 0.1–1.0)
Monocytes Relative: 10 % (ref 3–12)
Neutro Abs: 2.5 10*3/uL (ref 1.7–7.7)
RBC: 4.93 M/uL (ref 3.87–5.11)
RDW: 16.4 % — ABNORMAL HIGH (ref 11.5–15.5)
Total Bilirubin: 0.4 mg/dL (ref 0.3–1.2)

## 2008-01-03 ENCOUNTER — Telehealth (INDEPENDENT_AMBULATORY_CARE_PROVIDER_SITE_OTHER): Payer: Self-pay | Admitting: *Deleted

## 2008-01-23 ENCOUNTER — Ambulatory Visit: Payer: Self-pay | Admitting: Infectious Diseases

## 2008-01-31 ENCOUNTER — Telehealth: Payer: Self-pay | Admitting: Infectious Diseases

## 2008-02-05 ENCOUNTER — Telehealth (INDEPENDENT_AMBULATORY_CARE_PROVIDER_SITE_OTHER): Payer: Self-pay | Admitting: *Deleted

## 2008-02-21 ENCOUNTER — Telehealth: Payer: Self-pay | Admitting: Infectious Diseases

## 2008-02-26 ENCOUNTER — Encounter (INDEPENDENT_AMBULATORY_CARE_PROVIDER_SITE_OTHER): Payer: Self-pay | Admitting: *Deleted

## 2008-03-12 ENCOUNTER — Encounter: Payer: Self-pay | Admitting: Infectious Diseases

## 2008-03-24 ENCOUNTER — Ambulatory Visit: Payer: Self-pay | Admitting: Infectious Diseases

## 2008-03-24 ENCOUNTER — Encounter (INDEPENDENT_AMBULATORY_CARE_PROVIDER_SITE_OTHER): Payer: Self-pay | Admitting: Licensed Clinical Social Worker

## 2008-03-24 LAB — CONVERTED CEMR LAB: HIV 1 RNA Quant: <48 copies/mL (ref <48)

## 2008-03-25 ENCOUNTER — Telehealth: Payer: Self-pay | Admitting: Infectious Diseases

## 2008-03-25 LAB — CONVERTED CEMR LAB
ALT: 22 units/L (ref 0–35)
Alkaline Phosphatase: 120 units/L — ABNORMAL HIGH (ref 39–117)
Basophils Absolute: 0 10*3/uL (ref 0.0–0.1)
Eosinophils Absolute: 0.1 10*3/uL (ref 0.0–0.7)
Eosinophils Relative: 2 % (ref 0–5)
Glucose, Bld: 101 mg/dL — ABNORMAL HIGH (ref 70–99)
Hemoglobin: 13.2 g/dL (ref 12.0–15.0)
Lymphocytes Relative: 19 % (ref 12–46)
MCHC: 33.1 g/dL (ref 30.0–36.0)
Neutro Abs: 4.2 10*3/uL (ref 1.7–7.7)
Neutrophils Relative %: 64 % (ref 43–77)
RBC: 5.03 M/uL (ref 3.87–5.11)
Sodium: 136 meq/L (ref 135–145)
Total Bilirubin: 0.5 mg/dL (ref 0.3–1.2)
Total Protein: 8.4 g/dL — ABNORMAL HIGH (ref 6.0–8.3)
WBC: 6.5 10*3/uL (ref 4.0–10.5)

## 2008-03-26 ENCOUNTER — Ambulatory Visit: Payer: Self-pay | Admitting: Internal Medicine

## 2008-03-26 ENCOUNTER — Inpatient Hospital Stay (HOSPITAL_COMMUNITY): Admission: EM | Admit: 2008-03-26 | Discharge: 2008-03-31 | Payer: Self-pay | Admitting: Emergency Medicine

## 2008-03-26 ENCOUNTER — Ambulatory Visit: Payer: Self-pay | Admitting: Infectious Diseases

## 2008-03-26 ENCOUNTER — Telehealth: Payer: Self-pay | Admitting: Infectious Diseases

## 2008-03-26 LAB — CONVERTED CEMR LAB
BUN: 8 mg/dL (ref 6–23)
CO2: 26 meq/L (ref 19–32)
Glucose, Bld: 114 mg/dL — ABNORMAL HIGH (ref 70–99)
Potassium: 2.4 meq/L — CL (ref 3.5–5.3)

## 2008-03-31 ENCOUNTER — Telehealth: Payer: Self-pay | Admitting: Infectious Diseases

## 2008-04-09 ENCOUNTER — Ambulatory Visit: Payer: Self-pay | Admitting: Infectious Diseases

## 2008-04-09 ENCOUNTER — Encounter: Admission: RE | Admit: 2008-04-09 | Discharge: 2008-04-09 | Payer: Self-pay | Admitting: Infectious Diseases

## 2008-04-09 LAB — CONVERTED CEMR LAB
BUN: 15 mg/dL (ref 6–23)
Chloride: 102 meq/L (ref 96–112)
Creatinine, Ser: 0.58 mg/dL (ref 0.40–1.20)
Glucose, Bld: 174 mg/dL — ABNORMAL HIGH (ref 70–99)

## 2008-04-10 ENCOUNTER — Telehealth: Payer: Self-pay | Admitting: Infectious Diseases

## 2008-04-10 ENCOUNTER — Telehealth: Payer: Self-pay

## 2008-04-11 ENCOUNTER — Encounter: Payer: Self-pay | Admitting: Infectious Diseases

## 2008-04-11 ENCOUNTER — Ambulatory Visit: Payer: Self-pay | Admitting: Infectious Diseases

## 2008-04-11 LAB — CONVERTED CEMR LAB
BUN: 12 mg/dL (ref 6–23)
Chloride: 105 meq/L (ref 96–112)
Glucose, Bld: 152 mg/dL — ABNORMAL HIGH (ref 70–99)
Potassium: 3.2 meq/L — ABNORMAL LOW (ref 3.5–5.3)

## 2008-04-15 ENCOUNTER — Encounter: Payer: Self-pay | Admitting: Infectious Diseases

## 2008-04-16 ENCOUNTER — Telehealth: Payer: Self-pay | Admitting: Infectious Diseases

## 2008-04-16 ENCOUNTER — Ambulatory Visit: Payer: Self-pay | Admitting: Infectious Diseases

## 2008-04-16 LAB — CONVERTED CEMR LAB
BUN: 11 mg/dL (ref 6–23)
Calcium: 8.3 mg/dL — ABNORMAL LOW (ref 8.4–10.5)
Creatinine, Ser: 0.67 mg/dL (ref 0.40–1.20)
Glucose, Bld: 85 mg/dL (ref 70–99)

## 2008-04-22 ENCOUNTER — Encounter: Payer: Self-pay | Admitting: Infectious Diseases

## 2008-04-22 ENCOUNTER — Ambulatory Visit: Payer: Self-pay | Admitting: Infectious Diseases

## 2008-04-22 ENCOUNTER — Telehealth (INDEPENDENT_AMBULATORY_CARE_PROVIDER_SITE_OTHER): Payer: Self-pay | Admitting: *Deleted

## 2008-04-22 LAB — CONVERTED CEMR LAB
Calcium: 8.9 mg/dL (ref 8.4–10.5)
Sodium: 141 meq/L (ref 135–145)

## 2008-04-23 ENCOUNTER — Encounter: Payer: Self-pay | Admitting: Infectious Diseases

## 2008-05-20 ENCOUNTER — Ambulatory Visit: Payer: Self-pay | Admitting: Infectious Disease

## 2008-05-20 ENCOUNTER — Encounter: Payer: Self-pay | Admitting: Internal Medicine

## 2008-05-20 ENCOUNTER — Encounter: Payer: Self-pay | Admitting: Infectious Diseases

## 2008-05-20 LAB — CONVERTED CEMR LAB
Albumin: 4.4 g/dL (ref 3.5–5.2)
CO2: 25 meq/L (ref 19–32)
Cholesterol: 238 mg/dL — ABNORMAL HIGH (ref 0–200)
Eosinophils Relative: 4 % (ref 0–5)
Glucose, Bld: 71 mg/dL (ref 70–99)
HCT: 40.2 % (ref 36.0–46.0)
HIV-1 RNA Quant, Log: 2.59 — ABNORMAL HIGH (ref <1.68)
Hemoglobin: 12.9 g/dL (ref 12.0–15.0)
Lymphocytes Relative: 39 % (ref 12–46)
Lymphs Abs: 2.7 10*3/uL (ref 0.7–4.0)
Platelets: 215 10*3/uL (ref 150–400)
RBC: 4.86 M/uL (ref 3.87–5.11)
Sodium: 140 meq/L (ref 135–145)
Total Bilirubin: 0.4 mg/dL (ref 0.3–1.2)
Total Protein: 7.7 g/dL (ref 6.0–8.3)
Triglycerides: 199 mg/dL — ABNORMAL HIGH (ref <150)
VLDL: 40 mg/dL (ref 0–40)
WBC: 7 10*3/uL (ref 4.0–10.5)

## 2008-05-21 ENCOUNTER — Telehealth: Payer: Self-pay | Admitting: Infectious Diseases

## 2008-05-21 ENCOUNTER — Telehealth: Payer: Self-pay | Admitting: Internal Medicine

## 2008-06-09 ENCOUNTER — Ambulatory Visit: Payer: Self-pay | Admitting: Infectious Diseases

## 2008-06-09 ENCOUNTER — Encounter: Payer: Self-pay | Admitting: Infectious Diseases

## 2008-06-13 ENCOUNTER — Telehealth (INDEPENDENT_AMBULATORY_CARE_PROVIDER_SITE_OTHER): Payer: Self-pay | Admitting: *Deleted

## 2008-06-16 ENCOUNTER — Encounter: Payer: Self-pay | Admitting: Internal Medicine

## 2008-06-16 ENCOUNTER — Ambulatory Visit: Payer: Self-pay | Admitting: Internal Medicine

## 2008-06-17 LAB — CONVERTED CEMR LAB
CO2: 27 meq/L (ref 19–32)
Chloride: 104 meq/L (ref 96–112)
Potassium: 3.2 meq/L — ABNORMAL LOW (ref 3.5–5.3)
Sodium: 140 meq/L (ref 135–145)

## 2008-07-15 ENCOUNTER — Ambulatory Visit: Payer: Self-pay | Admitting: Internal Medicine

## 2008-07-15 ENCOUNTER — Encounter (INDEPENDENT_AMBULATORY_CARE_PROVIDER_SITE_OTHER): Payer: Self-pay | Admitting: Internal Medicine

## 2008-07-16 LAB — CONVERTED CEMR LAB
BUN: 13 mg/dL (ref 6–23)
CO2: 23 meq/L (ref 19–32)
Chloride: 104 meq/L (ref 96–112)
Creatinine, Ser: 0.87 mg/dL (ref 0.40–1.20)
Glucose, Bld: 100 mg/dL — ABNORMAL HIGH (ref 70–99)

## 2008-08-04 ENCOUNTER — Ambulatory Visit: Payer: Self-pay | Admitting: Internal Medicine

## 2008-08-04 ENCOUNTER — Encounter: Payer: Self-pay | Admitting: Internal Medicine

## 2008-08-06 LAB — CONVERTED CEMR LAB
BUN: 13 mg/dL (ref 6–23)
Creatinine, Ser: 0.74 mg/dL (ref 0.40–1.20)
GFR calc Af Amer: >60 mL/min (ref >60)
GFR calc non Af Amer: >60 mL/min (ref >60)

## 2008-08-07 ENCOUNTER — Telehealth: Payer: Self-pay | Admitting: Internal Medicine

## 2008-08-11 ENCOUNTER — Telehealth: Payer: Self-pay | Admitting: *Deleted

## 2008-08-29 ENCOUNTER — Ambulatory Visit: Payer: Self-pay | Admitting: Internal Medicine

## 2008-09-02 ENCOUNTER — Ambulatory Visit: Payer: Self-pay | Admitting: *Deleted

## 2008-09-02 ENCOUNTER — Encounter: Payer: Self-pay | Admitting: Internal Medicine

## 2008-09-03 LAB — CONVERTED CEMR LAB
BUN: 10 mg/dL (ref 6–23)
Calcium: 8.9 mg/dL (ref 8.4–10.5)
Creatinine, Ser: 0.66 mg/dL (ref 0.40–1.20)
GFR calc non Af Amer: >60 mL/min (ref >60)

## 2008-09-04 ENCOUNTER — Encounter: Payer: Self-pay | Admitting: Infectious Diseases

## 2008-09-26 ENCOUNTER — Encounter: Payer: Self-pay | Admitting: Internal Medicine

## 2008-09-26 ENCOUNTER — Ambulatory Visit: Payer: Self-pay | Admitting: Internal Medicine

## 2008-09-26 LAB — CONVERTED CEMR LAB
Calcium: 9.3 mg/dL (ref 8.4–10.5)
Creatinine, Ser: 0.82 mg/dL (ref 0.40–1.20)
GFR calc Af Amer: >60 mL/min (ref >60)
Glucose, Bld: 96 mg/dL (ref 70–99)
Sodium: 140 meq/L (ref 135–145)
TSH: 0.8 microintl units/mL (ref 0.350–4.500)

## 2008-10-08 ENCOUNTER — Ambulatory Visit: Payer: Self-pay | Admitting: Infectious Diseases

## 2008-10-08 LAB — CONVERTED CEMR LAB
Alkaline Phosphatase: 96 units/L (ref 39–117)
Creatinine, Ser: 0.74 mg/dL (ref 0.40–1.20)
Eosinophils Relative: 3 % (ref 0–5)
GFR calc Af Amer: >60 mL/min (ref >60)
GFR calc non Af Amer: >60 mL/min (ref >60)
Glucose, Bld: 92 mg/dL (ref 70–99)
HCT: 41.4 % (ref 36.0–46.0)
HIV 1 RNA Quant: 95 copies/mL — ABNORMAL HIGH (ref <48)
HIV-1 RNA Quant, Log: 1.98 — ABNORMAL HIGH (ref <1.68)
Hemoglobin: 13 g/dL (ref 12.0–15.0)
Lymphocytes Relative: 35 % (ref 12–46)
Lymphs Abs: 2.1 10*3/uL (ref 0.7–4.0)
Monocytes Absolute: 0.6 10*3/uL (ref 0.1–1.0)
Monocytes Relative: 10 % (ref 3–12)
Neutro Abs: 3 10*3/uL (ref 1.7–7.7)
RBC: 4.73 M/uL (ref 3.87–5.11)
RDW: 15.6 % — ABNORMAL HIGH (ref 11.5–15.5)
Sodium: 139 meq/L (ref 135–145)
Total Bilirubin: 0.5 mg/dL (ref 0.3–1.2)
Total Protein: 7.7 g/dL (ref 6.0–8.3)
WBC: 5.8 10*3/uL (ref 4.0–10.5)

## 2008-10-21 ENCOUNTER — Ambulatory Visit: Payer: Self-pay | Admitting: Internal Medicine

## 2008-10-21 ENCOUNTER — Encounter: Payer: Self-pay | Admitting: Internal Medicine

## 2008-10-23 DIAGNOSIS — E1169 Type 2 diabetes mellitus with other specified complication: Secondary | ICD-10-CM

## 2008-10-23 DIAGNOSIS — E785 Hyperlipidemia, unspecified: Secondary | ICD-10-CM

## 2008-10-23 LAB — CONVERTED CEMR LAB
CO2: 25 meq/L (ref 19–32)
Chloride: 100 meq/L (ref 96–112)
Creatinine, Ser: 0.91 mg/dL (ref 0.40–1.20)
HDL: 59 mg/dL (ref >39)
LDL Cholesterol: 193 mg/dL — ABNORMAL HIGH (ref 0–99)
Potassium: 3.6 meq/L (ref 3.5–5.3)
Sodium: 137 meq/L (ref 135–145)
Total CHOL/HDL Ratio: 4.9
Triglycerides: 200 mg/dL — ABNORMAL HIGH (ref <150)

## 2008-11-06 ENCOUNTER — Telehealth: Payer: Self-pay | Admitting: Internal Medicine

## 2008-11-07 ENCOUNTER — Ambulatory Visit: Payer: Self-pay | Admitting: Internal Medicine

## 2008-11-10 ENCOUNTER — Telehealth (INDEPENDENT_AMBULATORY_CARE_PROVIDER_SITE_OTHER): Payer: Self-pay | Admitting: *Deleted

## 2008-11-10 LAB — CONVERTED CEMR LAB: Candida species: POSITIVE — AB

## 2008-11-26 ENCOUNTER — Telehealth (INDEPENDENT_AMBULATORY_CARE_PROVIDER_SITE_OTHER): Payer: Self-pay | Admitting: *Deleted

## 2009-01-05 ENCOUNTER — Telehealth: Payer: Self-pay

## 2009-01-27 ENCOUNTER — Ambulatory Visit: Payer: Self-pay | Admitting: Internal Medicine

## 2009-01-28 LAB — CONVERTED CEMR LAB
Alkaline Phosphatase: 92 units/L (ref 39–117)
Basophils Absolute: 0 10*3/uL (ref 0.0–0.1)
Basophils Relative: 0 % (ref 0–1)
CO2: 21 meq/L (ref 19–32)
Creatinine, Ser: 0.68 mg/dL (ref 0.40–1.20)
Eosinophils Absolute: 0.1 10*3/uL (ref 0.0–0.7)
Glucose, Bld: 86 mg/dL (ref 70–99)
MCHC: 32 g/dL (ref 30.0–36.0)
MCV: 85 fL (ref >=78.0)
Monocytes Absolute: 0.6 10*3/uL (ref 0.1–1.0)
Monocytes Relative: 9 % (ref 3–12)
Neutro Abs: 3.3 10*3/uL (ref 1.7–7.7)
Neutrophils Relative %: 48 % (ref 43–77)
RBC: 4.81 M/uL (ref 3.87–5.11)
RDW: 15.6 % — ABNORMAL HIGH (ref 11.5–15.5)
Sodium: 141 meq/L (ref 135–145)
TSH: 1.379 microintl units/mL (ref 0.350–4.5)
Total Bilirubin: 0.5 mg/dL (ref 0.3–1.2)

## 2009-02-11 ENCOUNTER — Ambulatory Visit: Payer: Self-pay | Admitting: Internal Medicine

## 2009-02-23 ENCOUNTER — Encounter: Payer: Self-pay | Admitting: Infectious Diseases

## 2009-02-23 ENCOUNTER — Ambulatory Visit: Payer: Self-pay | Admitting: Internal Medicine

## 2009-02-23 LAB — CONVERTED CEMR LAB
Eosinophils Absolute: 0.1 10*3/uL (ref 0.0–0.7)
Eosinophils Relative: 2 % (ref 0–5)
HCT: 41.3 % (ref 36.0–46.0)
HIV-1 RNA Quant, Log: <1.68 (ref <1.68)
Hemoglobin: 13.1 g/dL (ref 12.0–15.0)
Lymphocytes Relative: 32 % (ref 12–46)
Lymphs Abs: 2.1 10*3/uL (ref 0.7–4.0)
MCV: 84.8 fL (ref >=78.0)
Monocytes Absolute: 0.5 10*3/uL (ref 0.1–1.0)
Monocytes Relative: 8 % (ref 3–12)
RBC: 4.87 M/uL (ref 3.87–5.11)
WBC: 6.7 10*3/uL (ref 4.0–10.5)

## 2009-02-24 ENCOUNTER — Telehealth (INDEPENDENT_AMBULATORY_CARE_PROVIDER_SITE_OTHER): Payer: Self-pay | Admitting: *Deleted

## 2009-02-24 ENCOUNTER — Encounter (INDEPENDENT_AMBULATORY_CARE_PROVIDER_SITE_OTHER): Payer: Self-pay | Admitting: Internal Medicine

## 2009-02-24 LAB — CONVERTED CEMR LAB
BUN: 11 mg/dL (ref 6–23)
CO2: 23 meq/L (ref 19–32)
Chloride: 100 meq/L (ref 96–112)
Glucose, Bld: 102 mg/dL — ABNORMAL HIGH (ref 70–99)
LDL Cholesterol: 164 mg/dL — ABNORMAL HIGH (ref 0–99)
Magnesium: 1.9 mg/dL (ref 1.5–2.5)
Potassium: 3.5 meq/L (ref 3.5–5.3)
Triglycerides: 157 mg/dL — ABNORMAL HIGH (ref <150)
VLDL: 31 mg/dL (ref 0–40)

## 2009-03-03 ENCOUNTER — Telehealth: Payer: Self-pay | Admitting: Internal Medicine

## 2009-03-04 ENCOUNTER — Telehealth: Payer: Self-pay | Admitting: Internal Medicine

## 2009-03-05 ENCOUNTER — Telehealth: Payer: Self-pay | Admitting: Internal Medicine

## 2009-03-06 ENCOUNTER — Encounter: Payer: Self-pay | Admitting: Internal Medicine

## 2009-03-10 ENCOUNTER — Telehealth: Payer: Self-pay | Admitting: Internal Medicine

## 2009-03-11 ENCOUNTER — Ambulatory Visit: Payer: Self-pay | Admitting: Infectious Diseases

## 2009-03-13 ENCOUNTER — Telehealth: Payer: Self-pay

## 2009-03-13 ENCOUNTER — Emergency Department (HOSPITAL_COMMUNITY): Admission: EM | Admit: 2009-03-13 | Discharge: 2009-03-14 | Payer: Self-pay | Admitting: Emergency Medicine

## 2009-03-13 ENCOUNTER — Emergency Department (HOSPITAL_COMMUNITY): Admission: EM | Admit: 2009-03-13 | Discharge: 2009-03-13 | Payer: Self-pay | Admitting: Emergency Medicine

## 2009-03-17 ENCOUNTER — Telehealth: Payer: Self-pay | Admitting: Infectious Diseases

## 2009-03-23 ENCOUNTER — Telehealth: Payer: Self-pay | Admitting: Internal Medicine

## 2009-04-06 ENCOUNTER — Telehealth: Payer: Self-pay | Admitting: Internal Medicine

## 2009-04-13 ENCOUNTER — Telehealth (INDEPENDENT_AMBULATORY_CARE_PROVIDER_SITE_OTHER): Payer: Self-pay | Admitting: *Deleted

## 2009-05-05 ENCOUNTER — Telehealth: Payer: Self-pay | Admitting: Internal Medicine

## 2009-05-22 ENCOUNTER — Ambulatory Visit: Payer: Self-pay | Admitting: Internal Medicine

## 2009-05-22 DIAGNOSIS — M549 Dorsalgia, unspecified: Secondary | ICD-10-CM | POA: Insufficient documentation

## 2009-05-22 LAB — CONVERTED CEMR LAB
ALT: 26 units/L (ref 0–35)
Alkaline Phosphatase: 104 units/L (ref 39–117)
CO2: 27 meq/L (ref 19–32)
Cholesterol: 232 mg/dL — ABNORMAL HIGH (ref 0–200)
Creatinine, Ser: 0.61 mg/dL (ref 0.40–1.20)
Total Bilirubin: 0.2 mg/dL — ABNORMAL LOW (ref 0.3–1.2)
Total CHOL/HDL Ratio: 4.6
Triglycerides: 149 mg/dL (ref <150)
VLDL: 30 mg/dL (ref 0–40)

## 2009-05-26 ENCOUNTER — Telehealth: Payer: Self-pay | Admitting: Infectious Diseases

## 2009-05-29 ENCOUNTER — Telehealth: Payer: Self-pay | Admitting: Internal Medicine

## 2009-06-10 ENCOUNTER — Encounter: Admission: RE | Admit: 2009-06-10 | Discharge: 2009-06-10 | Payer: Self-pay | Admitting: Infectious Diseases

## 2009-06-15 ENCOUNTER — Telehealth: Payer: Self-pay | Admitting: Internal Medicine

## 2009-06-17 ENCOUNTER — Ambulatory Visit: Payer: Self-pay | Admitting: Internal Medicine

## 2009-07-02 ENCOUNTER — Telehealth: Payer: Self-pay | Admitting: Internal Medicine

## 2009-07-29 ENCOUNTER — Telehealth: Payer: Self-pay | Admitting: Internal Medicine

## 2009-08-03 ENCOUNTER — Telehealth: Payer: Self-pay | Admitting: *Deleted

## 2009-08-26 ENCOUNTER — Ambulatory Visit: Payer: Self-pay | Admitting: Infectious Diseases

## 2009-08-26 DIAGNOSIS — N951 Menopausal and female climacteric states: Secondary | ICD-10-CM

## 2009-08-26 LAB — CONVERTED CEMR LAB
ALT: 49 units/L — ABNORMAL HIGH (ref 0–35)
Alkaline Phosphatase: 103 units/L (ref 39–117)
Basophils Absolute: 0 10*3/uL (ref 0.0–0.1)
Basophils Relative: 0 % (ref 0–1)
HIV 1 RNA Quant: <48 copies/mL (ref <48)
HIV-1 RNA Quant, Log: <1.68 (ref <1.68)
LDL Cholesterol: 186 mg/dL — ABNORMAL HIGH (ref 0–99)
MCHC: 31 g/dL (ref 30.0–36.0)
Neutro Abs: 4 10*3/uL (ref 1.7–7.7)
Neutrophils Relative %: 55 % (ref 43–77)
RBC: 4.83 M/uL (ref 3.87–5.11)
RDW: 15.8 % — ABNORMAL HIGH (ref 11.5–15.5)
Sodium: 138 meq/L (ref 135–145)
Total Bilirubin: 0.4 mg/dL (ref 0.3–1.2)
Total Protein: 7.5 g/dL (ref 6.0–8.3)
Triglycerides: 105 mg/dL (ref <150)
VLDL: 21 mg/dL (ref 0–40)

## 2009-08-27 ENCOUNTER — Telehealth: Payer: Self-pay | Admitting: Internal Medicine

## 2009-09-14 ENCOUNTER — Encounter: Payer: Self-pay | Admitting: Internal Medicine

## 2009-09-17 ENCOUNTER — Telehealth: Payer: Self-pay | Admitting: Internal Medicine

## 2009-09-18 ENCOUNTER — Encounter: Payer: Self-pay | Admitting: Internal Medicine

## 2009-09-28 ENCOUNTER — Encounter: Payer: Self-pay | Admitting: Internal Medicine

## 2009-10-05 ENCOUNTER — Ambulatory Visit: Payer: Self-pay | Admitting: Internal Medicine

## 2009-10-05 ENCOUNTER — Encounter: Payer: Self-pay | Admitting: Internal Medicine

## 2009-10-05 LAB — CONVERTED CEMR LAB
ALT: 23 units/L (ref 0–35)
CO2: 28 meq/L (ref 19–32)
Creatinine, Ser: 0.68 mg/dL (ref 0.40–1.20)
Total Bilirubin: 0.3 mg/dL (ref 0.3–1.2)

## 2009-10-08 ENCOUNTER — Telehealth: Payer: Self-pay | Admitting: Internal Medicine

## 2009-10-13 ENCOUNTER — Encounter: Payer: Self-pay | Admitting: Infectious Diseases

## 2009-10-15 ENCOUNTER — Encounter: Payer: Self-pay | Admitting: Internal Medicine

## 2009-10-15 ENCOUNTER — Encounter: Admission: RE | Admit: 2009-10-15 | Discharge: 2010-01-13 | Payer: Self-pay | Admitting: Internal Medicine

## 2009-10-15 ENCOUNTER — Encounter: Payer: Self-pay | Admitting: Infectious Diseases

## 2009-10-23 ENCOUNTER — Encounter: Payer: Self-pay | Admitting: Internal Medicine

## 2009-10-23 ENCOUNTER — Encounter: Admission: RE | Admit: 2009-10-23 | Discharge: 2009-10-23 | Payer: Self-pay | Admitting: Pediatrics

## 2009-11-09 ENCOUNTER — Telehealth: Payer: Self-pay | Admitting: Internal Medicine

## 2009-11-11 ENCOUNTER — Encounter: Payer: Self-pay | Admitting: Infectious Diseases

## 2009-11-12 ENCOUNTER — Telehealth (INDEPENDENT_AMBULATORY_CARE_PROVIDER_SITE_OTHER): Payer: Self-pay | Admitting: *Deleted

## 2009-11-13 ENCOUNTER — Telehealth: Payer: Self-pay | Admitting: Internal Medicine

## 2009-11-17 ENCOUNTER — Telehealth: Payer: Self-pay | Admitting: *Deleted

## 2009-12-23 ENCOUNTER — Ambulatory Visit: Payer: Self-pay | Admitting: Internal Medicine

## 2009-12-23 ENCOUNTER — Ambulatory Visit (HOSPITAL_COMMUNITY): Admission: RE | Admit: 2009-12-23 | Discharge: 2009-12-23 | Payer: Self-pay | Admitting: Internal Medicine

## 2009-12-23 LAB — CONVERTED CEMR LAB
Basophils Absolute: 0 10*3/uL (ref 0.0–0.1)
Basophils Relative: 0 % (ref 0–1)
Chloride: 104 meq/L (ref 96–112)
MCHC: 33.8 g/dL (ref 30.0–36.0)
Monocytes Relative: 10 % (ref 3–12)
Neutro Abs: 5.2 10*3/uL (ref 1.7–7.7)
Neutrophils Relative %: 59 % (ref 43–77)
Potassium: 3.4 meq/L — ABNORMAL LOW (ref 3.5–5.3)
RBC: 4.8 M/uL (ref 3.87–5.11)
RDW: 14 % (ref 11.5–15.5)

## 2009-12-24 ENCOUNTER — Encounter: Payer: Self-pay | Admitting: *Deleted

## 2009-12-24 ENCOUNTER — Telehealth: Payer: Self-pay | Admitting: Internal Medicine

## 2010-01-04 ENCOUNTER — Ambulatory Visit: Payer: Self-pay | Admitting: Internal Medicine

## 2010-01-05 ENCOUNTER — Ambulatory Visit (HOSPITAL_BASED_OUTPATIENT_CLINIC_OR_DEPARTMENT_OTHER): Admission: RE | Admit: 2010-01-05 | Discharge: 2010-01-05 | Payer: Self-pay | Admitting: Otolaryngology

## 2010-01-07 ENCOUNTER — Telehealth: Payer: Self-pay | Admitting: Internal Medicine

## 2010-01-13 ENCOUNTER — Telehealth (INDEPENDENT_AMBULATORY_CARE_PROVIDER_SITE_OTHER): Payer: Self-pay | Admitting: Licensed Clinical Social Worker

## 2010-01-19 ENCOUNTER — Telehealth (INDEPENDENT_AMBULATORY_CARE_PROVIDER_SITE_OTHER): Payer: Self-pay | Admitting: *Deleted

## 2010-01-25 ENCOUNTER — Encounter: Payer: Self-pay | Admitting: Internal Medicine

## 2010-01-25 ENCOUNTER — Encounter: Payer: Self-pay | Admitting: Infectious Diseases

## 2010-01-28 ENCOUNTER — Encounter (INDEPENDENT_AMBULATORY_CARE_PROVIDER_SITE_OTHER): Payer: Self-pay | Admitting: *Deleted

## 2010-01-28 ENCOUNTER — Ambulatory Visit: Payer: Self-pay | Admitting: Obstetrics and Gynecology

## 2010-01-28 LAB — CONVERTED CEMR LAB

## 2010-01-29 ENCOUNTER — Encounter: Payer: Self-pay | Admitting: Internal Medicine

## 2010-02-02 ENCOUNTER — Telehealth: Payer: Self-pay | Admitting: Internal Medicine

## 2010-02-03 ENCOUNTER — Telehealth: Payer: Self-pay | Admitting: Internal Medicine

## 2010-02-09 ENCOUNTER — Ambulatory Visit: Payer: Self-pay | Admitting: Internal Medicine

## 2010-02-09 ENCOUNTER — Encounter: Payer: Self-pay | Admitting: Internal Medicine

## 2010-02-11 ENCOUNTER — Encounter: Payer: Self-pay | Admitting: Internal Medicine

## 2010-02-16 ENCOUNTER — Telehealth: Payer: Self-pay | Admitting: Internal Medicine

## 2010-02-17 ENCOUNTER — Telehealth: Payer: Self-pay | Admitting: *Deleted

## 2010-02-23 ENCOUNTER — Emergency Department (HOSPITAL_COMMUNITY): Admission: EM | Admit: 2010-02-23 | Discharge: 2010-02-23 | Payer: Self-pay | Admitting: Emergency Medicine

## 2010-03-02 ENCOUNTER — Emergency Department (HOSPITAL_COMMUNITY): Admission: EM | Admit: 2010-03-02 | Discharge: 2010-03-02 | Payer: Self-pay | Admitting: Emergency Medicine

## 2010-03-02 ENCOUNTER — Ambulatory Visit: Payer: Self-pay | Admitting: Infectious Diseases

## 2010-03-02 ENCOUNTER — Telehealth: Payer: Self-pay | Admitting: *Deleted

## 2010-03-03 ENCOUNTER — Telehealth (INDEPENDENT_AMBULATORY_CARE_PROVIDER_SITE_OTHER): Payer: Self-pay | Admitting: *Deleted

## 2010-03-05 ENCOUNTER — Telehealth: Payer: Self-pay | Admitting: Internal Medicine

## 2010-03-08 ENCOUNTER — Ambulatory Visit: Payer: Self-pay | Admitting: Internal Medicine

## 2010-03-08 DIAGNOSIS — R0789 Other chest pain: Secondary | ICD-10-CM | POA: Insufficient documentation

## 2010-03-09 ENCOUNTER — Encounter (INDEPENDENT_AMBULATORY_CARE_PROVIDER_SITE_OTHER): Payer: Self-pay | Admitting: *Deleted

## 2010-03-15 ENCOUNTER — Telehealth: Payer: Self-pay | Admitting: Internal Medicine

## 2010-03-16 ENCOUNTER — Ambulatory Visit: Payer: Self-pay | Admitting: Internal Medicine

## 2010-03-23 ENCOUNTER — Telehealth: Payer: Self-pay | Admitting: Internal Medicine

## 2010-03-29 ENCOUNTER — Ambulatory Visit: Payer: Self-pay | Admitting: Internal Medicine

## 2010-03-29 ENCOUNTER — Telehealth: Payer: Self-pay | Admitting: Internal Medicine

## 2010-03-29 LAB — CONVERTED CEMR LAB
AST: 28 units/L (ref 0–37)
Albumin: 4.5 g/dL (ref 3.5–5.2)
CO2: 26 meq/L (ref 19–32)
Chloride: 103 meq/L (ref 96–112)
HDL: 53 mg/dL (ref >39)
LDL Cholesterol: 128 mg/dL — ABNORMAL HIGH (ref 0–99)
Sodium: 140 meq/L (ref 135–145)
Total Bilirubin: 0.6 mg/dL (ref 0.3–1.2)
Total CHOL/HDL Ratio: 4

## 2010-03-30 ENCOUNTER — Telehealth: Payer: Self-pay | Admitting: Internal Medicine

## 2010-04-01 ENCOUNTER — Ambulatory Visit: Payer: Self-pay | Admitting: Obstetrics and Gynecology

## 2010-04-02 ENCOUNTER — Telehealth: Payer: Self-pay | Admitting: Internal Medicine

## 2010-04-05 ENCOUNTER — Telehealth: Payer: Self-pay | Admitting: Internal Medicine

## 2010-04-09 ENCOUNTER — Ambulatory Visit: Payer: Self-pay

## 2010-04-27 ENCOUNTER — Encounter (INDEPENDENT_AMBULATORY_CARE_PROVIDER_SITE_OTHER): Payer: Self-pay | Admitting: *Deleted

## 2010-04-27 ENCOUNTER — Ambulatory Visit
Admission: RE | Admit: 2010-04-27 | Discharge: 2010-04-27 | Payer: Self-pay | Source: Home / Self Care | Attending: Adult Health | Admitting: Adult Health

## 2010-05-07 ENCOUNTER — Ambulatory Visit (HOSPITAL_COMMUNITY)
Admission: RE | Admit: 2010-05-07 | Discharge: 2010-05-07 | Payer: Self-pay | Source: Home / Self Care | Attending: Internal Medicine | Admitting: Internal Medicine

## 2010-05-07 ENCOUNTER — Ambulatory Visit: Admission: RE | Admit: 2010-05-07 | Discharge: 2010-05-07 | Payer: Self-pay | Source: Home / Self Care

## 2010-05-07 LAB — CONVERTED CEMR LAB
Nitrite: NEGATIVE
Specific Gravity, Urine: >=1.03
Urobilinogen, UA: 0.2

## 2010-05-12 ENCOUNTER — Encounter: Payer: Self-pay | Admitting: Licensed Clinical Social Worker

## 2010-05-16 ENCOUNTER — Encounter: Payer: Self-pay | Admitting: Infectious Diseases

## 2010-05-20 ENCOUNTER — Encounter (INDEPENDENT_AMBULATORY_CARE_PROVIDER_SITE_OTHER): Payer: Self-pay | Admitting: *Deleted

## 2010-05-23 LAB — CONVERTED CEMR LAB
AST: 24 units/L (ref 0–37)
BUN: 12 mg/dL (ref 6–23)
CO2: 27 meq/L (ref 19–32)
Creatinine, Ser: 0.76 mg/dL (ref 0.40–1.20)
HCT: 41.6 % (ref 34.4–43.3)
HIV 1 RNA Quant: <50 copies/mL (ref <50)
HIV-1 RNA Quant, Log: <1.7 (ref <1.70)
Potassium: 3 meq/L — ABNORMAL LOW (ref 3.5–5.3)
RBC: 4.99 M/uL — ABNORMAL HIGH (ref 3.79–4.96)

## 2010-05-24 ENCOUNTER — Telehealth: Payer: Self-pay | Admitting: *Deleted

## 2010-05-25 ENCOUNTER — Encounter: Payer: Self-pay | Admitting: Adult Health

## 2010-05-25 ENCOUNTER — Encounter: Payer: Self-pay | Admitting: Infectious Diseases

## 2010-05-25 ENCOUNTER — Ambulatory Visit
Admission: RE | Admit: 2010-05-25 | Discharge: 2010-05-25 | Payer: Self-pay | Source: Home / Self Care | Attending: Infectious Diseases | Admitting: Infectious Diseases

## 2010-05-25 LAB — CONVERTED CEMR LAB
CO2: 27 meq/L (ref 19–32)
Creatinine, Ser: 0.76 mg/dL (ref 0.40–1.20)
Eosinophils Absolute: 0.1 10*3/uL (ref 0.0–0.7)
Eosinophils Relative: 1 % (ref 0–5)
Glucose, Bld: 110 mg/dL — ABNORMAL HIGH (ref 70–99)
HCT: 40.4 % (ref 36.0–46.0)
HIV-1 RNA Quant, Log: <1.3 (ref <1.30)
Lymphs Abs: 3 10*3/uL (ref 0.7–4.0)
MCV: 81.8 fL (ref 78.0–100.0)
Monocytes Relative: 7 % (ref 3–12)
Neutrophils Relative %: 59 % (ref 43–77)
RBC: 4.94 M/uL (ref 3.87–5.11)
Total Bilirubin: 0.4 mg/dL (ref 0.3–1.2)
WBC: 9.2 10*3/uL (ref 4.0–10.5)

## 2010-05-26 LAB — T-HELPER CELL (CD4) - (RCID CLINIC ONLY): CD4 T Cell Abs: 770 uL (ref 400–2700)

## 2010-05-27 NOTE — Miscellaneous (Signed)
Summary: referral to gyn

## 2010-05-27 NOTE — Progress Notes (Signed)
Summary: Refill/gh  Phone Note Refill Request Message from:  Fax from Pharmacy on November 13, 2009 11:10 AM  Refills Requested: Medication #1:  K-VESCENT 20 MEQ PACK Take 2  tablets by mouth daily. Pharmacy would like to reset this tho 6 months intervals.   Method Requested: Electronic Initial call taken by: Angelina Ok RN,  November 13, 2009 11:10 AM  Follow-up for Phone Call        Refill approved-nurse to complete    Prescriptions: K-VESCENT 20 MEQ PACK (POTASSIUM CHLORIDE) Take 2  tablets by mouth daily.  #62 x 6   Entered and Authorized by:   Vassie Loll MD   Signed by:   Vassie Loll MD on 11/13/2009   Method used:   Telephoned to ...       [facility] (mail-order)       [address]       [place], [place]  [address]       Ph: [phone]       Fax: [phone]   RxID:   [id number]

## 2010-05-27 NOTE — Assessment & Plan Note (Signed)
Summary: CHECKUP/SB,   Vital Signs:  Patient profile:   50 year old female Menstrual status:  postmenopausal Height:      61 inches (154.94 cm) Weight:      157.4 pounds (71.55 kg) BMI:     29.85 Temp:     98.2 degrees F (36.78 degrees C) oral Pulse rate:   94 / minute BP sitting:   126 / 86  (left arm) Cuff size:   regular  Vitals Entered By: Krystal Eaton Duncan Dull) (May 22, 2009 3:01 PM) CC: pt c/o swelling of ankles when on her feet,  dizziness, some shortness of breath, headaches- for about (thinks its the norvasc) Pain Assessment Patient in pain? no      Nutritional Status BMI of 25 - 29 = overweight  Have you ever been in a relationship where you felt threatened, hurt or afraid?No   Does patient need assistance? Functional Status Self care Ambulation Normal   Primary Care Provider:  Vassie Loll MD  CC:  pt c/o swelling of ankles when on her feet, dizziness, some shortness of breath, and headaches- for about (thinks its the norvasc).  History of Present Illness: 50 y/o woman with pmh as described on the EMR. Who comes to the clinic complaining of ankles swelling, dizziness, palpitation and feeling down especially after taking all her BP medications (she takes the three of them at the same time). Pt denies nausea, abdominal pain, diarrhea, syncope, or any other complaints.  Patient also reports having some back pain in her lumbar area for the last 2 weeks, after falling out of bed, she has used aleve inconsistently with partial relief of the pain. No numbnes, no weakness and no walking difficulties due to pain.  She recently has mild cold symptoms, but they went away with OTC medications. Her last  CD4 was 460, VL <48. She stop smoking and is feeling otherwise well.  Problems Prior to Update: 1)  Vaginal Discharge  (ICD-623.5) 2)  Hyperlipidemia  (ICD-272.4) 3)  Screening For Malignant Neoplasm of The Cervix  (ICD-V76.2) 4)  Candidiasis, Vaginal   (ICD-112.1) 5)  Hypokalemia  (ICD-276.8) 6)  Family History Diabetes 1st Degree Relative  (ICD-V18.0) 7)  Family History of Cad Female 1st Degree Relative <60  (ICD-V16.49) 8)  Upper Respiratory Infection  (ICD-465.9) 9)  Bronchitis  (ICD-490) 10)  Insomnia  (ICD-780.52) 11)  Tobacco Abuse  (ICD-305.1) 12)  Depression  (ICD-311) 13)  Allergic Rhinitis  (ICD-477.9) 14)  Hypertension  (ICD-401.9) 15)  Disorder, Depressive Nec  (ICD-311) 16)  Hepatitis C  (ICD-070.51) 17)  Rhinitis, Allergic Nos  (ICD-477.9) 18)  Gerd  (ICD-530.81) 19)  Hypertension, Essential Nos  (ICD-401.9) 20)  HIV Disease  (ICD-042)  Medications Prior to Update: 1)  Truvada 200-300 Mg Tabs (Emtricitabine-Tenofovir) .... One Tab By Mouth Once Daily 2)  Lexiva 700 Mg Tabs (Fosamprenavir Calcium) .... 2 Tablets By Mouth Once Daily 3)  Norvir 100 Mg Caps (Ritonavir) .... 2 Tablets By Mouth Once Daily 4)  Claritin 10 Mg  Tabs (Loratadine) .... Take 1 Tablet By Mouth Once A Day 5)  Protonix 40 Mg Tbec (Pantoprazole Sodium) .... Take 1 Tablet By Mouth Two Times A Day 6)  K-Vescent 20 Meq Pack (Potassium Chloride) .... Take 2  Tablets By Mouth Daily. 7)  Avapro 150 Mg Tabs (Irbesartan) .... Take 1 Tablet By Mouth Once A Day 8)  Hydrochlorothiazide 25 Mg Tabs (Hydrochlorothiazide) .Marland Kitchen.. 1 Tab By Mouth Daily 9)  Singulair 10 Mg  Tabs (Montelukast Sodium) .... Take 1 Tablet By Mouth Once A Day 10)  Asmanex 120 Metered Doses 220 Mcg/inh Aepb (Mometasone Furoate) .... Inhaled Daily. 11)  Norvasc 5 Mg Tabs (Amlodipine Besylate) .... Take 1 Tablet By Mouth Once A Day 12)  Hydromet 5-1.5 Mg/43ml Syrp (Hydrocodone-Homatropine) .... Take One Teaspoon Every 6 Hours As Needed For Cough  Current Medications (verified): 1)  Truvada 200-300 Mg Tabs (Emtricitabine-Tenofovir) .... One Tab By Mouth Once Daily 2)  Lexiva 700 Mg Tabs (Fosamprenavir Calcium) .... 2 Tablets By Mouth Once Daily 3)  Norvir 100 Mg Caps (Ritonavir) .... 2  Tablets By Mouth Once Daily 4)  Claritin 10 Mg  Tabs (Loratadine) .... Take 1 Tablet By Mouth Once A Day 5)  Protonix 40 Mg Tbec (Pantoprazole Sodium) .... Take 1 Tablet By Mouth Two Times A Day 6)  K-Vescent 20 Meq Pack (Potassium Chloride) .... Take 2  Tablets By Mouth Daily. 7)  Avapro 150 Mg Tabs (Irbesartan) .... Take 1 Tablet By Mouth Once A Day 8)  Hydrochlorothiazide 25 Mg Tabs (Hydrochlorothiazide) .Marland Kitchen.. 1 Tab By Mouth Daily 9)  Singulair 10 Mg Tabs (Montelukast Sodium) .... Take 1 Tablet By Mouth Once A Day 10)  Asmanex 120 Metered Doses 220 Mcg/inh Aepb (Mometasone Furoate) .... Inhaled Daily. 11)  Norvasc 5 Mg Tabs (Amlodipine Besylate) .... Take 1 Tablet By Mouth Once A Day 12)  Hydromet 5-1.5 Mg/50ml Syrp (Hydrocodone-Homatropine) .... Take One Teaspoon Every 6 Hours As Needed For Cough  Allergies: 1)  ! Tylenol 2)  ! Morphine 3)  ! * Diazide  Past History:  Past Medical History: Last updated: 05/09/2006 HIV disease (1994) Hepatitis C Hypertension GERD Allergic rhinitis Depression  Family History: Last updated: 09/05/2007 Family History of CAD Female 1st degree relative <60 Family History Diabetes 1st degree relative Family History of Stroke F 1st degree relative <60 Family History Hypertension  Social History: Last updated: 09/02/2008 Former Smoker. Alcohol use-no Drug use-no.  Risk Factors: Alcohol Use: 0 (03/11/2009) Caffeine Use: coffee occassionally (03/11/2009) Exercise: yes (03/11/2009)  Risk Factors: Smoking Status: quit > 6 months (03/11/2009) Packs/Day: 5 ciggs (03/11/2009) Passive Smoke Exposure: no (03/11/2009)  Review of Systems       As per HPI.  Physical Exam  General:  alert, well-developed, well-nourished, and well-hydrated.   Lungs:  normal breath sounds, no crackles, and no wheezes.   Heart:  normal rate, regular rhythm, no murmur, and no gallop.   Abdomen:  Bowel sounds positive,abdomen soft and non-tender without  masses, organomegaly or hernias noted. Msk:  normal ROM, no joint swelling, no joint warmth, no redness over joints, no joint deformities, and no joint instability.   Mild tenderness on her back with palpation on her lumbar area. Neurologic:  No cranial nerve deficits noted. Station and gait are normal. Sensory, motor and coordinative functions appear intact. Patient is alert & oriented X3.     Impression & Recommendations:  Problem # 1:  BACK PAIN (ICD-724.5) Pt with back pain after falling out of bed. She will use tramadol and flexeril for pain and symptoms control and if pain failed to improved will performed x-ray of her lumbar area. I have discussed use of moist heat or ice, modified activities, medications, and stretching/strengthening exercises. Back care instructions given. To be seen in 2 weeks if no improvement; sooner if worsening of symptoms.   Her updated medication list for this problem includes:    Tramadol Hcl 50 Mg Tabs (Tramadol hcl) .Marland Kitchen... Take one  tablet by mouth every 8 hours as needed for pain.    Flexeril 5 Mg Tabs (Cyclobenzaprine hcl) .Marland Kitchen... Take 1 tab by mouth at bedtime  Problem # 2:  HYPERLIPIDEMIA (ICD-272.4) Patient LDL is  now (coming down from 190's range). Will continue monitoring her lipid profile and will also advised her to followa low fat diet. if during next check she is still above goal will start low dose statins.  Orders: T-Lipid Profile [id number])  Labs Reviewed: SGOT: 26 (01/27/2009)   SGPT: 31 (01/27/2009)   HDL:56 (02/24/2009), 59 (10/21/2008)  LDL:164 (02/24/2009), 193 (10/21/2008)  Chol:251 (02/24/2009), 292 (10/21/2008)  Trig:157 (02/24/2009), 200 (10/21/2008)  Problem # 3:  HYPOKALEMIA (ICD-276.8) Will check BMET today in order to evaluate patient potassium level. She has been using supplemental potassium on daily basis, but is also using a diuretic medication to control her BP (most likely cause of hypokalemia). Depending results will  decide if she needs a higher potassium dose.  Problem # 4:  HYPERTENSION (ICD-401.9) Well controlled and better than her goal.Since she has been experiencing mild symptoms after taking her antihypertensive medications; will spread them out throughout the day and will also reduce her norvasc to just 1/2 tablet. Will follow on her symptoms and will check renal function and electrolytes today.  Her updated medication list for this problem includes:    Avapro 150 Mg Tabs (Irbesartan) .Marland Kitchen... Take 1 tablet by mouth once a day    Hydrochlorothiazide 25 Mg Tabs (Hydrochlorothiazide) .Marland Kitchen... 1 tab by mouth daily    Norvasc 5 Mg Tabs (Amlodipine besylate) .Marland Kitchen... Take 1/2 tablet by mouth once a day  Problem # 5:  GERD (ICD-530.81) Patient denies severe indigestion symptoms. She will continue using protonix as indicated and will also follow lifestyle modification changes as recommended.  Her updated medication list for this problem includes:    Protonix 40 Mg Tbec (Pantoprazole sodium) .Marland Kitchen... Take 1 tablet by mouth two times a day  Problem # 6:  HIV DISEASE (ICD-042) Wel lcontrolled and stable. Pt with excellent medication compliance. She will continue same medication regimen and also close followup with her ID doctor.   Complete Medication List: 1)  Truvada 200-300 Mg Tabs (Emtricitabine-tenofovir) .... One tab by mouth once daily 2)  Lexiva 700 Mg Tabs (Fosamprenavir calcium) .... 2 tablets by mouth once daily 3)  Norvir 100 Mg Caps (Ritonavir) .... 2 tablets by mouth once daily 4)  Claritin 10 Mg Tabs (Loratadine) .... Take 1 tablet by mouth once a day 5)  Protonix 40 Mg Tbec (Pantoprazole sodium) .... Take 1 tablet by mouth two times a day 6)  K-vescent 20 Meq Pack (Potassium chloride) .... Take 2  tablets by mouth daily. 7)  Avapro 150 Mg Tabs (Irbesartan) .... Take 1 tablet by mouth once a day 8)  Hydrochlorothiazide 25 Mg Tabs (Hydrochlorothiazide) .Marland Kitchen.. 1 tab by mouth daily 9)  Singulair 10 Mg Tabs  (Montelukast sodium) .... Take 1 tablet by mouth once a day 10)  Asmanex 120 Metered Doses 220 Mcg/inh Aepb (Mometasone furoate) .... Inhaled daily. 11)  Norvasc 5 Mg Tabs (Amlodipine besylate) .... Take 1/2 tablet by mouth once a day 12)  Hydromet 5-1.5 Mg/28ml Syrp (Hydrocodone-homatropine) .... Take one teaspoon every 6 hours as needed for cough 13)  Tramadol Hcl 50 Mg Tabs (Tramadol hcl) .... Take one tablet by mouth every 8 hours as needed for pain. 14)  Flexeril 5 Mg Tabs (Cyclobenzaprine hcl) .... Take 1 tab by mouth at bedtime 15)  Flector 1.3 % Ptch (Diclofenac epolamine) .... Apply on the affected area twice a day.  Other Orders: T-Comprehensive Metabolic Panel ([id number])  Patient Instructions: 1)  Please schedule a follow-up appointment in 3 months. 2)  Take medications as prescribed. 3)  You will be called with any abnormalities in the tests scheduled or performed today.  If you don't hear from [place] within a week from when the test was performed, you can assume that your test was normal. 4)  Most patients (90%) with low back pain will improve with time (2-6 weeks). Keep active but avoid activities that are painful. Apply moist heat and/or ice to lower back several times a day. 5)  Call the clinic for sooner appointment if symptoms failed to improved. 6)  Remember to take your HCTZ in the AM; your avapro around midday and then your Norvasc (now just half tablet) at bedtime. Prescriptions: FLECTOR 1.3 % PTCH (DICLOFENAC EPOLAMINE) Apply on the affected area twice a day.  #1 x 0   Entered and Authorized by:   Vassie Loll MD   Signed by:   Vassie Loll MD on 05/22/2009   Method used:   Print then Give to Patient   RxID:   [id number] FLEXERIL 5 MG TABS (CYCLOBENZAPRINE HCL) Take 1 tab by mouth at bedtime  #20 x 0   Entered and Authorized by:   Vassie Loll MD   Signed by:   Vassie Loll MD on 05/22/2009   Method used:   Print then Give to Patient   RxID:    [id number] TRAMADOL HCL 50 MG TABS (TRAMADOL HCL) Take one tablet by mouth every 8 hours as needed for pain.  #40 x 1   Entered and Authorized by:   Vassie Loll MD   Signed by:   Vassie Loll MD on 05/22/2009   Method used:   Print then Give to Patient   RxID:   [id number]  Process Orders Check Orders Results:     [facility]: ABN not required for this insurance Tests Sent for requisitioning (May 27, 2009 10:14 AM):     05/22/2009: [facility] -- T-Comprehensive Metabolic Panel [[id number]] (signed)     05/22/2009: [facility] -- T-Lipid Profile [phone] (signed)    Prevention & Chronic Care Immunizations   Influenza vaccine: Fluvax Non-MCR  (01/27/2009)   Influenza vaccine deferral: Deferred  (10/21/2008)   Influenza vaccine due: 12/24/2008    Tetanus booster: Not documented   Tetanus booster due: 10/22/2018    Pneumococcal vaccine: Historical  (04/05/2004)   Pneumococcal vaccine deferral: Deferred  (10/21/2008)   Pneumococcal vaccine due: 04/05/2009  Other Screening   Pap smear: NEGATIVE FOR INTRAEPITHELIAL LESIONS OR MALIGNANCY.  (06/09/2008)   Pap smear due: 06/09/2009    Mammogram: ASSESSMENT: Negative - BI-RADS 1^MM DIGITAL SCREENING  (04/09/2008)   Mammogram due: 04/09/2009   Smoking status: quit > 6 months  (03/11/2009)  Lipids   Total Cholesterol: 251  (02/24/2009)   Lipid panel action/deferral: Lipid Panel ordered   LDL: 164  (02/24/2009)   LDL Direct: Not documented   HDL: 56  (02/24/2009)   Triglycerides: 157  (02/24/2009)    SGOT (AST): 26  (01/27/2009)   SGPT (ALT): 31  (01/27/2009) CMP ordered    Alkaline phosphatase: 92  (01/27/2009)   Total bilirubin: 0.5  (01/27/2009)  Hypertension   Last Blood Pressure: 126 / 86  (05/22/2009)   Serum creatinine: 0.78  (02/24/2009)   BMP action: Ordered   Serum  potassium 3.5  (02/24/2009) CMP ordered   Self-Management Support :    Personal Goals (by the next clinic visit) :      Personal blood pressure goal: 140/90  (10/21/2008)     Personal LDL goal: 130  (02/11/2009)    Patient will work on the following items until the next clinic visit to reach self-care goals:     Medications and monitoring: take my medicines every day  (05/22/2009)     Eating: use fresh or frozen vegetables, eat foods that are low in salt  (05/22/2009)     Activity: take a 30 minute walk every day  (02/11/2009)    Hypertension self-management support: Written self-care plan  (02/11/2009)    Lipid self-management support: Written self-care plan  (02/11/2009)   Ca

## 2010-05-27 NOTE — Letter (Signed)
Summary: TRANSPORTATIN SERVICES   TRANSPORTATIN SERVICES   Imported By: Margie Billet 10/27/2009 14:16:08  _____________________________________________________________________  External Attachment:    Type:   Image     Comment:   External Document

## 2010-05-27 NOTE — Letter (Signed)
Summary: [facility]ICAL [facility]AL[facility] CM[facility]  MEDICAL MODALITIES CMN   Imported By: Shon Hough 01/29/2010 12:49:51  _____________________________________________________________________  External Attachment:    Type:   Image     Comment:   External Document

## 2010-05-27 NOTE — Letter (Signed)
Summary: [facility] ORDER TENS UNIT  [facility]ED[facility] ORDER TENS UNIT   Imported By: Shon Hough 02/17/2010 11:12:44  _____________________________________________________________________  External Attachment:    Type:   Image     Comment:   External Document

## 2010-05-27 NOTE — Progress Notes (Signed)
  Phone Note Call from Patient   Caller: Patient Call For: Dr. Johny Sax Summary of Call: Patient called stating that protonix is not covered under her medicaid and wants to know if there is something stronger than Nexium that may be covered. Currently she is taking Nexium and it doesn't work. Initial call taken by: Starleen Arms CMA,  January 13, 2010 9:29 AM  Follow-up for Phone Call        I sent a rx for protonix to [facility] to see if Medicaid will cover. If not we look for a prior authorization letter from the pharmacy to let them know she has tried several drugs and failed them Follow-up by: Starleen Arms CMA,  January 18, 2010 10:06 AM    New/Updated Medications: PROTONIX 40 MG TBEC (PANTOPRAZOLE SODIUM) take on daily Prescriptions: PROTONIX 40 MG TBEC (PANTOPRAZOLE SODIUM) take on daily  #30 x 5   Entered by:   Starleen Arms CMA   Authorized by:   Johny Sax MD   Signed by:   Starleen Arms CMA on 01/18/2010   Method used:   Faxed to ...       [facility] (mail-order)       [address]       [place], [place]  [address]       Ph: [phone]       Fax: [phone]   RxID:   [id number]

## 2010-05-27 NOTE — Progress Notes (Signed)
Summary: refill/gg  Phone Note Refill Request  on May 05, 2009 2:17 PM  refill for fluconazole 100 mg 1 tab by mouth every day  for 3 days.   Method Requested: Fax to Local Pharmacy Initial call taken by: Merrie Roof RN,  May 05, 2009 2:27 PM  Follow-up for Phone Call        Launa Grill, why are we refilling her fluconazole. Is the patient complaining of vaginal fungal infection, has she been exposed to antibiotics, or what is going on????? I have checked the E-chart system and I couldn't find any documentation regarding ER visits or recent prescribed antibiotics.     Appended Document: refill/gg Pt called and she does NOT need this med.  It was a pharmacy request.

## 2010-05-27 NOTE — Assessment & Plan Note (Signed)
Summary: BACK PAIN/SB.   Vital Signs:  Patient profile:   50 year old female Menstrual status:  postmenopausal Height:      61 inches (154.94 cm) Weight:      155.1 pounds (70.50 kg) BMI:     29.41 Temp:     97.2 degrees F (36.22 degrees C) oral Pulse rate:   89 / minute BP sitting:   119 / 82  (right arm)  Vitals Entered By: Stanton Kidney Ditzler RN (February 09, 2010 10:30 AM) CC: Depression Is Patient Diabetic? No Pain Assessment Patient in pain? no      Nutritional Status BMI of 25 - 29 = overweight Nutritional Status Detail appetite good  Have you ever been in a relationship where you felt threatened, hurt or afraid?denies   Does patient need assistance? Functional Status Self care Ambulation Normal Comments Nasal surgery 01/05/10 Dr Marvetta Gibbons . Larey Seat 04/2009 on deck - prolems with back off and on.   Primary Care Provider:  Vassie Loll MD  CC:  Depression.  History of Present Illness: 50 yr old woman with pmhx as described below comes to the clinic for follow up. Patient reports that at this time she does not have any back pain. There is episodes in the last couple of weeks as the weather is changing she feels more back pain. Vicodin helps with the pain but she doesn't like to take it because it makes her very drowsy. Denies any new weakness, numbness, tingling, saddle anesthesia, bowel or bladder incontinence. Reports that diclofenac helped with the pain.   Depression History:      The patient denies a depressed mood most of the day and a diminished interest in her usual daily activities.         Preventive Screening-Counseling & Management  Alcohol-Tobacco     Alcohol drinks/day: 0     Smoking Status: quit > 6 months     Smoking Cessation Counseling: yes     Packs/Day: 5 ciggs     Year Started: 1983     Year Quit: 2010     Passive Smoke Exposure: no  Caffeine-Diet-Exercise     Caffeine use/day: coffee occassionally     Does Patient Exercise: yes     Type of  exercise: walking     Exercise (avg: min/session): <30     Times/week: 4  Problems Prior to Update: 1)  Cellulitis, Foot, Right  (ICD-682.7) 2)  Pain in Joint, Ankle and Foot  (ICD-719.47) 3)  Symptomatic Menopausal/female Climacteric States  (ICD-627.2) 4)  Back Pain  (ICD-724.5) 5)  Vaginal Discharge  (ICD-623.5) 6)  Hyperlipidemia  (ICD-272.4) 7)  Screening For Malignant Neoplasm of The Cervix  (ICD-V76.2) 8)  Candidiasis, Vaginal  (ICD-112.1) 9)  Hypokalemia  (ICD-276.8) 10)  Family History Diabetes 1st Degree Relative  (ICD-V18.0) 11)  Family History of Cad Female 1st Degree Relative <60  (ICD-V16.49) 12)  Upper Respiratory Infection  (ICD-465.9) 13)  Bronchitis  (ICD-490) 14)  Insomnia  (ICD-780.52) 15)  Tobacco Abuse  (ICD-305.1) 16)  Depression  (ICD-311) 17)  Allergic Rhinitis  (ICD-477.9) 18)  Hypertension  (ICD-401.9) 19)  Disorder, Depressive Nec  (ICD-311) 20)  Hepatitis C  (ICD-070.51) 21)  Rhinitis, Allergic Nos  (ICD-477.9) 22)  Gerd  (ICD-530.81) 23)  Hypertension, Essential Nos  (ICD-401.9) 24)  HIV Disease  (ICD-042)  Medications Prior to Update: 1)  Truvada 200-300 Mg Tabs (Emtricitabine-Tenofovir) .... One Tab By Mouth Once Daily 2)  Lexiva 700 Mg Tabs (Fosamprenavir Calcium) .Marland KitchenMarland KitchenMarland Kitchen  2 Tablets By Mouth Once Daily 3)  Norvir 100 Mg Caps (Ritonavir) .... 2 Tablets By Mouth Once Daily 4)  Claritin 10 Mg  Tabs (Loratadine) .... Take 1 Tablet By Mouth Once A Day 5)  K-Vescent 20 Meq Pack (Potassium Chloride) .... Take 2  Tablets By Mouth Daily. 6)  Cozaar 50 Mg Tabs (Losartan Potassium) .... Take 1 Tablet By Mouth Once A Day 7)  Hydrochlorothiazide 25 Mg Tabs (Hydrochlorothiazide) .Marland Kitchen.. 1 Tab By Mouth Daily 8)  Singulair 10 Mg Tabs (Montelukast Sodium) .... Take 1 Tablet By Mouth Once A Day 9)  Asmanex 120 Metered Doses 220 Mcg/inh Aepb (Mometasone Furoate) .... Inhaled Daily. 10)  Norvasc 5 Mg Tabs (Amlodipine Besylate) .... Take 1/2 Tablet By Mouth Once A  Day 11)  Ventolin Hfa 108 (90 Base) Mcg/act Aers (Albuterol Sulfate) .... Inhale 2 Puff Every 6 Hours As Needed For Sob and Wheezing. 12)  Pravachol 40 Mg Tabs (Pravastatin Sodium) .... Take 1 Tablet By Mouth Once A Day 13)  Nexium 40 Mg Cpdr (Esomeprazole Magnesium) .... Take 1 Capsule By Mouth Once A Day 14)  Protonix 40 Mg Tbec (Pantoprazole Sodium) .... Take On Daily 15)  Vicodin 5-500 Mg Tabs (Hydrocodone-Acetaminophen) .... Take 1 Tab Every 8 Hours As Needed For Pain.  Current Medications (verified): 1)  Truvada 200-300 Mg Tabs (Emtricitabine-Tenofovir) .... One Tab By Mouth Once Daily 2)  Lexiva 700 Mg Tabs (Fosamprenavir Calcium) .... 2 Tablets By Mouth Once Daily 3)  Norvir 100 Mg Caps (Ritonavir) .... 2 Tablets By Mouth Once Daily 4)  Claritin 10 Mg  Tabs (Loratadine) .... Take 1 Tablet By Mouth Once A Day 5)  K-Vescent 20 Meq Pack (Potassium Chloride) .... Take 2  Tablets By Mouth Daily. 6)  Cozaar 50 Mg Tabs (Losartan Potassium) .... Take 1 Tablet By Mouth Once A Day 7)  Hydrochlorothiazide 25 Mg Tabs (Hydrochlorothiazide) .Marland Kitchen.. 1 Tab By Mouth Daily 8)  Singulair 10 Mg Tabs (Montelukast Sodium) .... Take 1 Tablet By Mouth Once A Day 9)  Asmanex 120 Metered Doses 220 Mcg/inh Aepb (Mometasone Furoate) .... Inhaled Daily. 10)  Norvasc 5 Mg Tabs (Amlodipine Besylate) .... Take 1/2 Tablet By Mouth Once A Day 11)  Ventolin Hfa 108 (90 Base) Mcg/act Aers (Albuterol Sulfate) .... Inhale 2 Puff Every 6 Hours As Needed For Sob and Wheezing. 12)  Pravachol 40 Mg Tabs (Pravastatin Sodium) .... Take 1 Tablet By Mouth Once A Day 13)  Protonix 20 Mg Tbec (Pantoprazole Sodium) .... Take 1 Tablet By Mouth Two Times A Day 14)  Protonix 40 Mg Tbec (Pantoprazole Sodium) .... Take On Daily 15)  Vicodin 5-500 Mg Tabs (Hydrocodone-Acetaminophen) .... Take 1 Tab Every 8 Hours As Needed For Pain. 16)  Naprosyn 500 Mg Tabs (Naproxen) .... Take 1 Tablet By Mouth Two Times A Day W/  Food As Needed For  Back Pain  Allergies: 1)  ! Tylenol 2)  ! Morphine 3)  ! * Diazide  Past History:  Past Medical History: Last updated: 05/09/2006 HIV disease (1994) Hepatitis C Hypertension GERD Allergic rhinitis Depression  Family History: Last updated: 08/26/2009 Family History of CAD Female 1st degree relative <60 Family History Diabetes 1st degree relative- 3 siblings Family History of Stroke F 1st degree relative <60 Family History Hypertension  Social History: Last updated: 09/02/2008 Former Smoker. Alcohol use-no Drug use-no.  Risk Factors: Alcohol Use: 0 (02/09/2010) Caffeine Use: coffee occassionally (02/09/2010) Exercise: yes (02/09/2010)  Risk Factors: Smoking Status: quit > 6  months (02/09/2010) Packs/Day: 5 ciggs (02/09/2010) Passive Smoke Exposure: no (02/09/2010)  Family History: Reviewed history from 08/26/2009 and no changes required. Family History of CAD Female 1st degree relative <60 Family History Diabetes 1st degree relative- 3 siblings Family History of Stroke F 1st degree relative <60 Family History Hypertension  Social History: Reviewed history from 09/02/2008 and no changes required. Former Smoker. Alcohol use-no Drug use-no.  Review of Systems  The patient denies fever, chest pain, dyspnea on exertion, peripheral edema, hemoptysis, abdominal pain, melena, hematochezia, hematuria, muscle weakness, difficulty walking, and unusual weight change.    Physical Exam  General:  NAD  Mouth:  MMM Neck:  supple.   Lungs:  normal breath sounds, no crackles, and no wheezes.   Heart:  normal rate, regular rhythm, no murmur, and no gallop.   Abdomen:  Bowel sounds positive,abdomen soft and non-tender without masses, organomegaly or hernias noted. Msk:  Negative straight leg raise, normal ROM, no joint swelling, no joint warmth, no redness over joints, and no joint instability.   Extremities:  no edema Neurologic:  alert & oriented X3, strength normal in  all extremities, sensation intact to light touch, and gait normal.     Impression & Recommendations:  Problem # 1:  BACK PAIN (ICD-724.5) Denies pain today. Patient does not have any new weakness, numbness, tingling, saddle anesthesia, bladder or bowel incontinence. Will have her take Naproxen for pain as needed with food. Will reevaluate back pain in one month. If not well controlled on current regimen or symptoms worsen will consider imaging on follow up.  Her updated medication list for this problem includes:    Vicodin 5-500 Mg Tabs (Hydrocodone-acetaminophen) .Marland Kitchen... Take 1 tab every 8 hours as needed for pain.    Naprosyn 500 Mg Tabs (Naproxen) .Marland Kitchen... Take 1 tablet by mouth two times a day w/  food as needed for back pain  Problem # 2:  HYPERTENSION (ICD-401.9) At goal. Continue current regimen.  Her updated medication list for this problem includes:    Cozaar 50 Mg Tabs (Losartan potassium) .Marland Kitchen... Take 1 tablet by mouth once a day    Hydrochlorothiazide 25 Mg Tabs (Hydrochlorothiazide) .Marland Kitchen... 1 tab by mouth daily    Norvasc 5 Mg Tabs (Amlodipine besylate) .Marland Kitchen... Take 1/2 tablet by mouth once a day  BP today: 119/82 Prior BP: 152/99 (01/04/2010)  Labs Reviewed: K+: 3.4 (12/23/2009) Creat: : 0.84 (12/23/2009)   Chol: 270 (08/26/2009)   HDL: 63 (08/26/2009)   LDL: 186 (08/26/2009)   TG: 105 (08/26/2009)  Problem # 3:  GERD (ICD-530.81) Changed nexium to protonix. Will follow up.  The following medications were removed from the medication list:    Protonix 40 Mg Tbec (Pantoprazole sodium) .Marland Kitchen... Take on daily Her updated medication list for this problem includes:    Protonix 20 Mg Tbec (Pantoprazole sodium) .Marland Kitchen... Take 1 tablet by mouth two times a day  Problem # 4:  HYPERLIPIDEMIA (ICD-272.4) Uncontrolled. Recheck FLP on follow up and consider increasing statin.  Her updated medication list for this problem includes:    Pravachol 40 Mg Tabs (Pravastatin sodium) .Marland Kitchen... Take 1 tablet  by mouth once a day  Labs Reviewed: SGOT: 18 (10/05/2009)   SGPT: 23 (10/05/2009)   HDL:63 (08/26/2009), 50 (05/22/2009)  LDL:186 (08/26/2009), 152 (05/22/2009)  Chol:270 (08/26/2009), 232 (05/22/2009)  Trig:105 (08/26/2009), 149 (05/22/2009)  Complete Medication List: 1)  Truvada 200-300 Mg Tabs (Emtricitabine-tenofovir) .... One tab by mouth once daily 2)  Lexiva 700 Mg Tabs (Fosamprenavir calcium) .Marland KitchenMarland KitchenMarland Kitchen  2 tablets by mouth once daily 3)  Norvir 100 Mg Caps (Ritonavir) .... 2 tablets by mouth once daily 4)  Claritin 10 Mg Tabs (Loratadine) .... Take 1 tablet by mouth once a day 5)  K-vescent 20 Meq Pack (Potassium chloride) .... Take 2  tablets by mouth daily. 6)  Cozaar 50 Mg Tabs (Losartan potassium) .... Take 1 tablet by mouth once a day 7)  Hydrochlorothiazide 25 Mg Tabs (Hydrochlorothiazide) .Marland Kitchen.. 1 tab by mouth daily 8)  Singulair 10 Mg Tabs (Montelukast sodium) .... Take 1 tablet by mouth once a day 9)  Asmanex 120 Metered Doses 220 Mcg/inh Aepb (Mometasone furoate) .... Inhaled daily. 10)  Norvasc 5 Mg Tabs (Amlodipine besylate) .... Take 1/2 tablet by mouth once a day 11)  Ventolin Hfa 108 (90 Base) Mcg/act Aers (Albuterol sulfate) .... Inhale 2 puff every 6 hours as needed for sob and wheezing. 12)  Pravachol 40 Mg Tabs (Pravastatin sodium) .... Take 1 tablet by mouth once a day 13)  Protonix 20 Mg Tbec (Pantoprazole sodium) .... Take 1 tablet by mouth two times a day 14)  Vicodin 5-500 Mg Tabs (Hydrocodone-acetaminophen) .... Take 1 tab every 8 hours as needed for pain. 15)  Naprosyn 500 Mg Tabs (Naproxen) .... Take 1 tablet by mouth two times a day w/  food as needed for back pain  Patient Instructions: 1)  Please schedule a follow-up appointment in 1 month. 2)  Start taking Naproxen as directed for back pain with food.  3)  Take all medicaiton as directed. Prescriptions: PROTONIX 20 MG TBEC (PANTOPRAZOLE SODIUM) Take 1 tablet by mouth two times a day  #60 x 2   Entered  and Authorized by:   Laren Everts MD   Signed by:   Laren Everts MD on 02/09/2010   Method used:   Print then Give to Patient   RxID:   [id number] NAPROSYN 500 MG TABS (NAPROXEN) Take 1 tablet by mouth two times a day w/  food as needed for back pain  #60 x 1   Entered and Authorized by:   Laren Everts MD   Signed by:   Laren Everts MD on 02/09/2010   Method used:   Print then Give to Patient   RxID:   [id number]    Orders Added: 1)  Est. Patient Level III [84696]    Prevention & Chronic Care Immunizations   Influenza vaccine: Fluvax Non-MCR  (01/27/2009)   Influenza vaccine deferral: Deferred  (10/21/2008)   Influenza vaccine due: 12/25/2010    Tetanus booster: Not documented   Td booster deferral: Deferred  (12/23/2009)   Tetanus booster due: 10/22/2018    Pneumococcal vaccine: Historical  (04/05/2004)   Pneumococcal vaccine deferral: Deferred  (12/23/2009)   Pneumococcal vaccine due: 04/05/2009  Other Screening   Pap smear: NEGATIVE FOR INTRAEPITHELIAL LESIONS OR MALIGNANCY.  (06/09/2008)   Pap smear action/deferral: Deferred  (12/23/2009)   Pap smear due: 06/09/2009    Mammogram: ASSESSMENT: Negative - BI-RADS 1^MM DIGITAL SCREENING  (06/10/2009)   Mammogram due: 06/10/2010   Smoking status: quit > 6 months  (02/09/2010)  Lipids   Total Cholesterol: 270  (08/26/2009)   Lipid panel action/deferral: Lipid Panel ordered   LDL: 186  (08/26/2009)   LDL Direct: Not documented   HDL: 63  (08/26/2009)   Triglycerides: 105  (08/26/2009)    SGOT (AST): 18  (10/05/2009)   SGPT (ALT): 23  (10/05/2009)   Alkaline phosphatase: 100  (10/05/2009)   Total bilirubin:  0.3  (10/05/2009)    Lipid flowsheet reviewed?: Yes   Progress toward LDL goal: Unchanged  Hypertension   Last Blood Pressure: 119 / 82  (02/09/2010)   Serum creatinine: 0.84  (12/23/2009)   BMP action: Ordered   Serum potassium 3.4  (12/23/2009)     Hypertension flowsheet reviewed?: Yes   Progress toward BP goal: At goal  Self-Management Support :   Personal Goals (by the next clinic visit) :      Personal blood pressure goal: 140/90  (10/21/2008)     Personal LDL goal: 130  (02/11/2009)    Patient will work on the following items until the next clinic visit to reach self-care goals:     Medications and monitoring: take my medicines every day, check my blood pressure, bring all of my medications to every visit, weigh myself weekly  (02/09/2010)     Eating: eat more vegetables, use fresh or frozen vegetables, eat baked foods instead of fried foods, eat fruit for snacks and desserts, limit or avoid alcohol  (02/09/2010)     Activity: take a 30 minute walk every day  (02/09/2010)    Hypertension self-management support: Written self-care plan, Education handout, Resources for patients handout  (02/09/2010)   Hypertension self-care plan printed.   Hypertension education handout printed    Lipid self-management support: Written self-care plan, Education handout, Resources for patients handout  (02/09/2010)   Lipid self-care plan printed.   Lipid education handout printed      Resource handout printed.

## 2010-05-27 NOTE — Progress Notes (Signed)
Summary: Request refill  Phone Note From Pharmacy   Caller: [facility] Reason for Call: Needs renewal Summary of Call: Received a prescription request for Ventolin HFA (Albuterol inhaler) Inhale 2 puffs by mouth every 4 to 6 hours as needed for cough or wheezing.  This was removed due to duplication of therapy by Dr.Ghergie.  Did you want this forwarded to [facility] or are you filling this for her? Not sure if you want patient to continue to follow up with [facility] on this medicatin. Initial call taken by: Paulo Fruit  BS,CPht II,MPH,  Sep 17, 2009 9:14 AM  Follow-up for Phone Call        Please go ahead and refill that medication for her. she has exporadic and seasonal wheezing and cough and will benefit of as needed use of albuterol.    New/Updated Medications: VENTOLIN HFA 108 (90 BASE) MCG/ACT AERS (ALBUTEROL SULFATE) Inhale 2 puff every 6 hours as needed for SOB and wheezing. Prescriptions: VENTOLIN HFA 108 (90 BASE) MCG/ACT AERS (ALBUTEROL SULFATE) Inhale 2 puff every 6 hours as needed for SOB and wheezing.  #1 x 4   Entered and Authorized by:   Vassie Loll MD   Signed by:   Vassie Loll MD on 09/17/2009   Method used:   Telephoned to ...       [facility] (mail-order)       [address]       [place], [place]  [address]       Ph: [phone]       Fax: [phone]   RxID:   [id number]   Appended Document: Request refill Ventolin Rx called to [facility]

## 2010-05-27 NOTE — Progress Notes (Signed)
Summary: Cough  Phone Note Call from Patient   Caller: Patient Call For: Vassie Loll MD Summary of Call: Call from pt says that she is feeling ok but still has the cough.  Said that the cough is non productive.  Has no fever with it.  Has been around her neice whom she thinks gave her a cold again.  Tried Robitussin that she says ran her B/P up.  Pt said that she would like to try the Hydromet cough syrup again if possible. Initial call taken by: Angelina Ok RN,  June 15, 2009 11:04 AM  Follow-up for Phone Call        [facility] to use the cough syrup.    Prescriptions: HYDROMET 5-1.5 MG/5ML SYRP (HYDROCODONE-HOMATROPINE) take one teaspoon every 6 hours as needed for cough  #60 cc x 1   Entered by:   Vassie Loll MD   Authorized by:   Johny Sax MD   Signed by:   Vassie Loll MD on 06/15/2009   Method used:   Telephoned to ...       [facility] (mail-order)       [address]       [place], [place]  [address]       Ph: [phone]       Fax: [phone]   RxID:   [id number]

## 2010-05-27 NOTE — Consult Note (Signed)
Summary: [facility]  OUTPATIENT REHAB   Imported By: Margie Billet 10/23/2009 11:11:56  _____________________________________________________________________  External Attachment:    Type:   Image     Comment:   External Document

## 2010-05-27 NOTE — Progress Notes (Signed)
Summary: [facility]  Phone Note Refill Request Message from:  Fax from Pharmacy on February 16, 2010 1:36 PM  Refills Requested: Medication #1:  PROTONIX 20 MG TBEC Take 1 tablet by mouth two times a day  Medicaid will not cover 60 tablets per month.  Or Prior Athorization needs to be completed.   Thanks   Method Requested: Fax to Local Pharmacy Initial call taken by: Chinita Pester RN,  February 16, 2010 1:36 PM  Follow-up for Phone Call        Do we know which medication would be paid for this patient and if they are unable to provide that info, let's get the documentation needed for authorization.

## 2010-05-27 NOTE — Progress Notes (Signed)
Summary: bus passes/ hla  Phone Note Call from Patient   Summary of Call: pt needs a letter sent to transportation so she can continue to get bus passes, fax # [phone] Initial call taken by: Marin Roberts RN,  March 29, 2010 3:27 PM  Follow-up for Phone Call        A letter saying what???? Please find out with patient what exactly medically she needs a letter for; if is regarding social help; referred request to Lupita Leash T.  Thanks!!!!!!

## 2010-05-27 NOTE — Letter (Signed)
Summary: [facility]   [facility]   Imported By: Florinda Marker 11/11/2009 16:13:45  _____________________________________________________________________  External Attachment:    Type:   Image     Comment:   External Document

## 2010-05-27 NOTE — Progress Notes (Signed)
Summary: Cough   Phone Note Call from Patient   Caller: Patient Summary of Call: increased tempt 100 degrees with dry hacking cough and chills X 2 days.  Headache present.   No shortness of breath or sinus congestion.  Please advise.  Initial call taken by: Tomasita Morrow RN,  May 26, 2009 2:00 PM  Follow-up for Phone Call        Per Dr Ninetta Lights, ok for Avelox take one daily X 8 days with Hyrodmet cough syrup.   Pt advised to call if symptoms worsen or do not improve within 3-4 days of Atbx..    Prescriptions: HYDROMET 5-1.5 MG/5ML SYRP (HYDROCODONE-HOMATROPINE) take one teaspoon every 6 hours as needed for cough  #60 cc x 0   Entered by:   Tomasita Morrow RN   Authorized by:   Johny Sax MD   Signed by:   Tomasita Morrow RN on 05/26/2009   Method used:   Telephoned to ...       [facility] (mail-order)       [address]       [place], [place]  [address]       Ph: [phone]       Fax: [phone]   RxID:   [id number]  Avelox take on tablet daily for 8 days . #8 with 0RF.  Phone to above pharmacy.  Doucment unsigned in the chart that would not allow me to add medicaiton .   Tomasita Morrow RN  May 26, 2009 2:27 PM

## 2010-05-27 NOTE — Assessment & Plan Note (Signed)
Summary: cough/TY   Primary Provider:  Vassie Loll MD  CC:  follow-up visit, pt complaining of dizziness, cough, and stuffy head.  History of Present Illness: Pt c/o mostly dry cough sometimes productive of yellow sputum. She has been using OTC cough medication which has increased her BP.  No chest pain or SOB.  Preventive Screening-Counseling & Management  Alcohol-Tobacco     Alcohol drinks/day: 0     Smoking Status: quit > 6 months     Smoking Cessation Counseling: yes     Packs/Day: 5 ciggs     Year Started: 1983     Year Quit: 2010     Passive Smoke Exposure: no  Caffeine-Diet-Exercise     Caffeine use/day: coffee occassionally     Does Patient Exercise: yes     Type of exercise: walking, gym membership     Exercise (avg: min/session): <30     Times/week: 4      Sexual History:  currently monogamous.        Drug Use:  current, marijuana, and up to three times a wk.     Updated Prior Medication List: TRUVADA 200-300 MG TABS (EMTRICITABINE-TENOFOVIR) one tab by mouth once daily LEXIVA 700 MG TABS (FOSAMPRENAVIR CALCIUM) 2 tablets by mouth once daily NORVIR 100 MG CAPS (RITONAVIR) 2 tablets by mouth once daily CLARITIN 10 MG  TABS (LORATADINE) Take 1 tablet by mouth once a day PROTONIX 40 MG TBEC (PANTOPRAZOLE SODIUM) Take 1 tablet by mouth two times a day K-VESCENT 20 MEQ PACK (POTASSIUM CHLORIDE) Take 2  tablets by mouth daily. AVAPRO 150 MG TABS (IRBESARTAN) Take 1 tablet by mouth once a day HYDROCHLOROTHIAZIDE 25 MG TABS (HYDROCHLOROTHIAZIDE) 1 tab by mouth daily SINGULAIR 10 MG TABS (MONTELUKAST SODIUM) Take 1 tablet by mouth once a day ASMANEX 120 METERED DOSES 220 MCG/INH AEPB (MOMETASONE FUROATE) inhaled daily. NORVASC 5 MG TABS (AMLODIPINE BESYLATE) Take 1/2 tablet by mouth once a day HYDROMET 5-1.5 MG/5ML SYRP (HYDROCODONE-HOMATROPINE) take one teaspoon every 6 hours as needed for cough TRAMADOL HCL 50 MG TABS (TRAMADOL HCL) Take one tablet by mouth  every 8 hours as needed for pain. FLEXERIL 5 MG TABS (CYCLOBENZAPRINE HCL) Take 1 tab by mouth at bedtime VOLTAREN 1 % GEL (DICLOFENAC SODIUM) Apply in your tender area twice a day ZITHROMAX Z-PAK 250 MG TABS (AZITHROMYCIN) take as directed  Current Allergies: ! TYLENOL ! MORPHINE ! * DIAZIDE Past History:  Past Medical History: Last updated: 05/09/2006 HIV disease (1994) Hepatitis C Hypertension GERD Allergic rhinitis Depression  Review of Systems  The patient denies fever, chest pain, dyspnea on exertion, and hemoptysis.    Vital Signs:  Patient profile:   50 year old female Menstrual status:  postmenopausal Height:      61 inches (154.94 cm) Weight:      154.1 pounds (70.05 kg) BMI:     29.22 Temp:     97.4 degrees F (36.33 degrees C) oral Pulse rate:   91 / minute BP sitting:   144 / 100  (left arm)  Vitals Entered By: Wendall Mola CMA Duncan Dull) (June 17, 2009 3:19 PM) CC: follow-up visit, pt complaining of dizziness, cough, stuffy head Is Patient Diabetic? No Pain Assessment Patient in pain? no      Nutritional Status BMI of 25 - 29 = overweight Nutritional Status Detail appetite "not too good"  Does patient need assistance? Functional Status Self care Ambulation Normal   Physical Exam  General:  alert, well-developed, well-nourished,  and well-hydrated.   Head:  normocephalic and atraumatic.   Mouth:  pharynx pink and moist.   Lungs:  diffuse expiratory wheezes   Impression & Recommendations:  Problem # 1:  BRONCHITIS (ICD-490) Will treat with a z-pack. Her updated medication list for this problem includes:    Singulair 10 Mg Tabs (Montelukast sodium) .Marland Kitchen... Take 1 tablet by mouth once a day    Asmanex 120 Metered Doses 220 Mcg/inh Aepb (Mometasone furoate) ..... Inhaled daily.    Hydromet 5-1.5 Mg/54ml Syrp (Hydrocodone-homatropine) .Marland Kitchen... Take one teaspoon every 6 hours as needed for cough    Zithromax Z-pak 250 Mg Tabs  (Azithromycin) .Marland Kitchen... Take as directed  Orders: Est. Patient Level III ([address])  Medications Added to Medication List This Visit: 1)  Zithromax Z-pak 250 Mg Tabs (Azithromycin) .... Take as directed Prescriptions: ZITHROMAX Z-PAK 250 MG TABS (AZITHROMYCIN) take as directed  #1 pack x 0   Entered and Authorized by:   Yisroel Ramming MD   Signed by:   Yisroel Ramming MD on 06/17/2009   Method used:   Print then Give to Patient   RxID:   [id number]

## 2010-05-27 NOTE — Progress Notes (Signed)
Summary: refill/gg  Phone Note Refill Request  on April 02, 2010 3:49 PM   Medication #2:  NAPROSYN 500 MG TABS Take 1 tablet by mouth two times a day w/  food as needed for back pain.   Last Refilled: 03/12/2010  Method Requested: Fax to Local Pharmacy Initial call taken by: Merrie Roof RN,  April 02, 2010 3:49 PM  Follow-up for Phone Call        Refill approved-nurse to complete  Additional Follow-up for Phone Call Additional follow up Details #1::        Rx faxed to pharmacy Additional Follow-up by: Merrie Roof RN,  April 02, 2010 3:56 PM    New/Updated Medications: VENTOLIN HFA 108 (90 BASE) MCG/ACT AERS (ALBUTEROL SULFATE) Inhale 1-2 puff every 6 hrs as needed as needed for shortness of breath. Prescriptions: VENTOLIN HFA 108 (90 BASE) MCG/ACT AERS (ALBUTEROL SULFATE) Inhale 1-2 puff every 6 hrs as needed as needed for shortness of breath.  #1 x 3   Entered and Authorized by:   Vassie Loll MD   Signed by:   Vassie Loll MD on 04/02/2010   Method used:   Telephoned to ...       [facility] (retail)       [address]       [place], [place]  [address]  [place]       Ph: [phone]       Fax: [phone]   RxID:   [id number] NAPROSYN 500 MG TABS (NAPROXEN) Take 1 tablet by mouth two times a day w/  food as needed for back pain  #60 x 1   Entered and Authorized by:   Vassie Loll MD   Signed by:   Vassie Loll MD on 04/02/2010   Method used:   Telephoned to ...       [facility] (retail)       [address]       [place], [place]  [address]  [place]       Ph: [phone]       Fax: [phone]   RxID:   [id number]

## 2010-05-27 NOTE — Progress Notes (Signed)
Summary: Medication change  Phone Note Refill Request   Refills Requested: Medication #1:  FLECTOR 1.3 % PTCH Apply on the affected area twice a day.. Medicaid will not pay for the patches.  Pt wants another topical pain rub that she can use.   Method Requested: Electronic Initial call taken by: Angelina Ok RN,  May 29, 2009 10:00 AM    New/Updated Medications: VOLTAREN 1 % GEL (DICLOFENAC SODIUM) Apply in your tender area twice a day Prescriptions: VOLTAREN 1 % GEL (DICLOFENAC SODIUM) Apply in your tender area twice a day  #1 x 1   Entered and Authorized by:   Vassie Loll MD   Signed by:   Vassie Loll MD on 05/29/2009   Method used:   Telephoned to ...       [facility] (mail-order)       [address]       [place], [place]  [address]       Ph: [phone]       Fax: [phone]   RxID:   [id number]

## 2010-05-27 NOTE — Medication Information (Signed)
Summary: [facility]: Prior Auth  [facility]: Prior Auth   Imported By: Florinda Marker 01/25/2010 15:50:59  _____________________________________________________________________  External Attachment:    Type:   Image     Comment:   External Document

## 2010-05-27 NOTE — Progress Notes (Signed)
Summary: Refill/gh  Phone Note Refill Request Message from:  Fax from Pharmacy on July 29, 2009 3:02 PM  Refills Requested: Medication #1:  HYDROMET 5-1.5 MG/5ML SYRP take one teaspoon every 6 hours as needed for cough  Medication #2:  K-VESCENT 20 MEQ PACK Take 2  tablets by mouth daily.  Method Requested: Electronic Initial call taken by: Angelina Ok RN,  July 29, 2009 3:04 PM  Follow-up for Phone Call        Refill for potassium given  Additional Follow-up for Phone Call Additional follow up Details #1::        If patient is still coughing she might need further evaluation, other than just more cough syrup.    Prescriptions: K-VESCENT 20 MEQ PACK (POTASSIUM CHLORIDE) Take 2  tablets by mouth daily.  #62 x 3   Entered and Authorized by:   Vassie Loll MD   Signed by:   Vassie Loll MD on 07/30/2009   Method used:   Telephoned to ...       [facility] (mail-order)       [address]       [place], [place]  [address]       Ph: [phone]       Fax: [phone]   RxID:   [id number]

## 2010-05-27 NOTE — Letter (Signed)
Summary: Generic Letter  [facility]  [address]   [place], [place] [address]   Phone: [phone]  Fax: [phone]    08/26/2009  Fay Records  The above patient was seen today in our office.         Sincerely,   Kathi Simpers [facility])

## 2010-05-27 NOTE — Progress Notes (Signed)
Summary: refill/gg  **  Phone Note Refill Request  on February 02, 2010 11:37 AM  Pt request vicodin  for low back pain.  Hx of same.  She has completed PT.  Rates pain 9/10  onset last Wednesday but getting worse.  She states you talked to her about getting MRI. Will you refill or do you want pt seen?   Method Requested: Telephone to Pharmacy Initial call taken by: Merrie Roof RN,  February 02, 2010 11:37 AM  Follow-up for Phone Call        If pain continues she definitely needs to be evaluated and if needed will required some images study to find out source/reason of her pain. I will give 30 tabs of vicodin to help with pain control while clinic arrange for acute office visit.    New/Updated Medications: VICODIN 5-500 MG TABS (HYDROCODONE-ACETAMINOPHEN) Take 1 tab every 8 hours as needed for pain. Prescriptions: VICODIN 5-500 MG TABS (HYDROCODONE-ACETAMINOPHEN) Take 1 tab every 8 hours as needed for pain.  #30 x 0   Entered and Authorized by:   Vassie Loll MD   Signed by:   Vassie Loll MD on 02/02/2010   Method used:   Telephoned to ...       [facility] (retail)       [address]       [place], [place]  [address]  [place]       Ph: [phone]       Fax: [phone]   RxID:   [id number]   Appended Document: refill/gg  ** Rx called in

## 2010-05-27 NOTE — Progress Notes (Signed)
Summary: Refill/gh  Phone Note Refill Request Message from:  Fax from Pharmacy on Aug 27, 2009 11:06 AM  Refills Requested: Medication #1:  SINGULAIR 10 MG TABS Take 1 tablet by mouth once a day  Medication #2:  HYDROCHLOROTHIAZIDE 25 MG TABS 1 tab by mouth daily  Medication #3:  NORVASC 5 MG TABS Take 1/2 tablet by mouth once a day  Method Requested: Electronic Initial call taken by: Angelina Ok RN,  Aug 27, 2009 11:07 AM  Follow-up for Phone Call        Refill approved-nurse to complete    Prescriptions: NORVASC 5 MG TABS (AMLODIPINE BESYLATE) Take 1/2 tablet by mouth once a day  #31 x 5   Entered and Authorized by:   Vassie Loll MD   Signed by:   Vassie Loll MD on 08/27/2009   Method used:   Telephoned to ...       [facility] (mail-order)       [address]       [place], [place]  [address]       Ph: [phone]       Fax: [phone]   RxID:   [id number] SINGULAIR 10 MG TABS (MONTELUKAST SODIUM) Take 1 tablet by mouth once a day  #31 x 5   Entered and Authorized by:   Vassie Loll MD   Signed by:   Vassie Loll MD on 08/27/2009   Method used:   Telephoned to ...       [facility] (mail-order)       [address]       [place], [place]  [address]       Ph: [phone]       Fax: [phone]   RxID:   [id number] HYDROCHLOROTHIAZIDE 25 MG TABS (HYDROCHLOROTHIAZIDE) 1 tab by mouth daily  #31 x 6   Entered and Authorized by:   Vassie Loll MD   Signed by:   Vassie Loll MD on 08/27/2009   Method used:   Telephoned to ...       [facility] (mail-order)       [address]       [place], [place]  [address]       Ph: [phone]       Fax: [phone]   RxID:   [id number]   Appended Document: Refill/gh Refills on HCTZ, Singulair and Norvasc call to [facility]. Angelina Ok, RN Aug 31, 2009 9:53 AM

## 2010-05-27 NOTE — Progress Notes (Signed)
Summary: prior authorization approved for protonix  Phone Note From Pharmacy   Caller: [facility] Request: Needs authorization from insurer Summary of Call: recieved a prior authorization for patient's Protonix.  After speaking with the nurse, I was told that the nexium does not work per patient..  Patient has also tried and failed omeprazole in the pastk, therefore the prior authorization should go through  with no problem because patient has met the criteria to qualify her to obtain protonix.  MD or representative is to call [phone] to intiate the claim for this medications.  Her Medicaid ID is [id number] S.  Please contact [facility] with the status of the claim so it can be processed and the medicaton can be taken to patient. Initial call taken by: Paulo Fruit  BS,CPht II,MPH,  January 19, 2010 11:14 AM  Follow-up for Phone Call        Protonix has been approved based on the criteria being met of patient trying and failing Omeprazole and Nexium.  Pharmacy has been notified. It is approved for 1 year (01/19/10 to 01/20/11) Follow-up by: Paulo Fruit  BS,CPht II,MPH,  January 19, 2010 11:36 AM

## 2010-05-27 NOTE — Progress Notes (Signed)
Summary: refill/gg  Phone Note Refill Request  on July 02, 2009 11:36 AM  Refills Requested: Medication #1:  TRAMADOL HCL 50 MG TABS Take one tablet by mouth every 8 hours as needed for pain.  Method Requested: Fax to Local Pharmacy Initial call taken by: Merrie Roof RN,  July 02, 2009 11:37 AM  Follow-up for Phone Call        Refill approved-nurse to complete    Prescriptions: TRAMADOL HCL 50 MG TABS (TRAMADOL HCL) Take one tablet by mouth every 8 hours as needed for pain.  #40 x 1   Entered and Authorized by:   Vassie Loll MD   Signed by:   Vassie Loll MD on 07/02/2009   Method used:   Telephoned to ...       [facility] (mail-order)       [address]       [place], [place]  [address]       Ph: [phone]       Fax: [phone]   RxID:   [id number]   Appended Document: refill/gg Rx called in

## 2010-05-27 NOTE — Progress Notes (Signed)
Summary: prior authorization-Pantoprazole/gp  Phone Note Other Incoming   Summary of Call: Medicaid was called for Prior Authorization on Pantoprazole 20mg  twice daiy.  It has been approved by 1 year starting today. [facility] made awared.   Initial call taken by: Chinita Pester RN,  February 17, 2010 11:36 AM

## 2010-05-27 NOTE — Assessment & Plan Note (Signed)
Summary: CHECKUP/NEW TO DR. Terrance Mass.   Vital Signs:  Patient profile:   50 year old female Menstrual status:  postmenopausal Height:      61 inches (154.94 cm) Weight:      153.9 pounds (69.95 kg) BMI:     29.18 Temp:     97.9 degrees F oral Pulse rate:   75 / minute BP sitting:   132 / 88  (right arm) Cuff size:   regular  Vitals Entered By: Chinita Pester RN (May 07, 2010 10:00 AM) CC: Check BP. Back pain. Is Patient Diabetic? No Pain Assessment Patient in pain? yes     Location: back Intensity: 6 Type: aching Onset of pain  Intermittent; took Aleve Nutritional Status BMI of 25 - 29 = overweight  Have you ever been in a relationship where you felt threatened, hurt or afraid?No   Does patient need assistance? Functional Status Self care Ambulation Normal   Primary Care Provider:  Vassie Loll MD  CC:  Check BP. Back pain.Marland Kitchen  History of Present Illness: Acute on chronic right lumbar-sacral pain with a hx of trauma in 2010.Denies consititutional symptoms. No radiculopathy, urinary or bowel incontinence, no weakness, disethesias or paresthesias. However, patient states that LBP awakes her at night; and states an increase in use of Vicodin and Naprosyn. Underwent phsyical therapy in 2010 with some relief of her Sx. Denies   Depression History:      The patient denies a depressed mood most of the day and a diminished interest in her usual daily activities.         Preventive Screening-Counseling & Management  Alcohol-Tobacco     Alcohol drinks/day: 0     Smoking Status: quit > 6 months     Smoking Cessation Counseling: yes     Year Started: 39     Year Quit: 2010     Passive Smoke Exposure: no  Caffeine-Diet-Exercise     Caffeine use/day: coffee occassionally     Does Patient Exercise: yes     Type of exercise: walking     Exercise (avg: min/session): <30     Times/week: 4  Current Problems (verified): 1)  Upper Respiratory Infection  (ICD-465.9) 2)  Chest  Pain, Atypical  (ICD-786.59) 3)  Symptomatic Menopausal/female Climacteric States  (ICD-627.2) 4)  Back Pain  (ICD-724.5) 5)  Hyperlipidemia  (ICD-272.4) 6)  Screening For Malignant Neoplasm of The Cervix  (ICD-V76.2) 7)  Hypokalemia  (ICD-276.8) 8)  Family History Diabetes 1st Degree Relative  (ICD-V18.0) 9)  Family History of Cad Female 1st Degree Relative <60  (ICD-V16.49) 10)  Insomnia  (ICD-780.52) 11)  Depression  (ICD-311) 12)  Allergic Rhinitis  (ICD-477.9) 13)  Hypertension  (ICD-401.9) 14)  Disorder, Depressive Nec  (ICD-311) 15)  Hepatitis C  (ICD-070.51) 16)  Rhinitis, Allergic Nos  (ICD-477.9) 17)  Gerd  (ICD-530.81) 18)  Hypertension, Essential Nos  (ICD-401.9) 19)  HIV Disease  (ICD-042)  Current Medications (verified): 1)  Truvada 200-300 Mg Tabs (Emtricitabine-Tenofovir) .... One Tab By Mouth Once Daily 2)  Lexiva 700 Mg Tabs (Fosamprenavir Calcium) .... 2 Tablets By Mouth Once Daily 3)  Norvir 100 Mg Caps (Ritonavir) .... 2 Tablets By Mouth Once Daily 4)  Claritin 10 Mg  Tabs (Loratadine) .... Take 1 Tablet By Mouth Once A Day 5)  K-Vescent 20 Meq Pack (Potassium Chloride) .... Take 2  Tablets By Mouth Daily. 6)  Hydrochlorothiazide 25 Mg Tabs (Hydrochlorothiazide) .Marland Kitchen.. 1 Tab By Mouth Daily 7)  Singulair 10 Mg  Tabs (Montelukast Sodium) .... Take 1 Tablet By Mouth Once A Day 8)  Norvasc 10 Mg Tabs (Amlodipine Besylate) .... Take 1 Tablet By Mouth Once A Day 9)  Pravachol 40 Mg Tabs (Pravastatin Sodium) .... Take 1 Tablet By Mouth Once A Day 10)  Protonix 20 Mg Tbec (Pantoprazole Sodium) .... Take 1 Tablet By Mouth Two Times A Day 11)  Vicodin 5-500 Mg Tabs (Hydrocodone-Acetaminophen) .... Take 1 Tab Every 8 Hours As Needed For Pain. 12)  Naprosyn 500 Mg Tabs (Naproxen) .... Take 1 Tablet By Mouth Two Times A Day W/  Food As Needed For Back Pain 13)  Ventolin Hfa 108 (90 Base) Mcg/act Aers (Albuterol Sulfate) .... Inhale 1-2 Puff Every 6 Hrs As Needed As Needed For  Shortness of Breath. 14)  Azithromycin 250 Mg Tabs (Azithromycin) .... 2 Tablets Now Then 1 Tablet By Mouth Once Daily For Remainder of 5 Days.  Allergies (verified): 1)  ! Tylenol 2)  ! Morphine 3)  ! * Diazide  Directives (verified): 1)  Full Code   Past History:  Past medical, surgical, family and social histories (including risk factors) reviewed, and no changes noted (except as noted below).  Past Medical History: Reviewed history from 05/09/2006 and no changes required. HIV disease (1994) Hepatitis C Hypertension GERD Allergic rhinitis Depression  Family History: Reviewed history from 08/26/2009 and no changes required. Family History of CAD Female 1st degree relative <60 Family History Diabetes 1st degree relative- 3 siblings Family History of Stroke F 1st degree relative <60 Family History Hypertension  Social History: Reviewed history from 03/29/2010 and no changes required. Former Smoker. Quit 2009, smoked 1/2 PPD Alcohol use-no Drug use-no. Disabled Single No children   Physical Exam  General:  alert and well-developed.   Head:  normocephalic and atraumatic.   Eyes:  vision grossly intact, pupils equal, pupils round, and pupils reactive to light.  EOMI.  sclera anicteric Ears:  no external deformities.   Mouth:  pharynx pink and moist.   Neck:  supple.   Lungs:  normal respiratory effort, normal breath sounds, no crackles, and no wheezes.   Heart:  normal rate, regular rhythm, no murmur, no gallop, and no JVD.   Abdomen:  soft, non-tender, normal bowel sounds, and no distention.   Msk:  Right L-S paraspinal TTP noted with tapping and palpation. SLR is negative. decreased ROm on estension to 45 degrees. normal ROM, no joint tenderness, no joint swelling, no joint warmth, and no redness over joints.   Pulses:  2+ Extremities:  No edema.  Neurologic:  alert & oriented X3 and gait normal.  strength normal in all extremities, sensation intact to light  touch, sensation intact to pinprick, gait normal, DTRs symmetrical and normal, heel-to-shin normal, and toes down bilaterally on Babinski.   Skin:  turgor normal, color normal, no rashes, and no suspicious lesions.   Psych:  normally interactive.  Oriented X3, good eye contact, not anxious appearing, and not depressed appearing.     Impression & Recommendations:  Problem # 1:  BACK PAIN (ICD-724.5) Assessment Deteriorated  Acute on chronic right lumbar-sacral pain with a hx of trauma in 2010. No neurological impairment, or consititutional symptoms. However, patient states that LBP awakes her at night; and states an increase in use of Vicodin and Naprosyn. Will check ESR, UA, XR of LS- spine. Her updated medication list for this problem includes:    Vicodin 5-500 Mg Tabs (Hydrocodone-acetaminophen) .Marland Kitchen... Take 1 tab every 8 hours as needed  for pain.    Naprosyn 500 Mg Tabs (Naproxen) .Marland Kitchen... Take 1 tablet by mouth two times a day w/  food as needed for back pain  Orders: T-Sed Rate (Automated) (98119-14782) Diagnostic X-Ray/Fluoroscopy (Diagnostic X-Ray/Flu) T-Urinalysis Dipstick only (95621HY) Physical Therapy Referral (PT)  Discussed use of moist heat or ice, modified activities, medications, and stretching/strengthening exercises. Back care instructions given. To be seen in 2 weeks if no improvement; sooner if worsening of symptoms.   Complete Medication List: 1)  Truvada 200-300 Mg Tabs (Emtricitabine-tenofovir) .... One tab by mouth once daily 2)  Lexiva 700 Mg Tabs (Fosamprenavir calcium) .... 2 tablets by mouth once daily 3)  Norvir 100 Mg Caps (Ritonavir) .... 2 tablets by mouth once daily 4)  Claritin 10 Mg Tabs (Loratadine) .... Take 1 tablet by mouth once a day 5)  K-vescent 20 Meq Pack (Potassium chloride) .... Take 2  tablets by mouth daily. 6)  Hydrochlorothiazide 25 Mg Tabs (Hydrochlorothiazide) .Marland Kitchen.. 1 tab by mouth daily 7)  Singulair 10 Mg Tabs (Montelukast sodium) ....  Take 1 tablet by mouth once a day 8)  Norvasc 10 Mg Tabs (Amlodipine besylate) .... Take 1 tablet by mouth once a day 9)  Pravachol 40 Mg Tabs (Pravastatin sodium) .... Take 1 tablet by mouth once a day 10)  Protonix 20 Mg Tbec (Pantoprazole sodium) .... Take 1 tablet by mouth two times a day 11)  Vicodin 5-500 Mg Tabs (Hydrocodone-acetaminophen) .... Take 1 tab every 8 hours as needed for pain. 12)  Naprosyn 500 Mg Tabs (Naproxen) .... Take 1 tablet by mouth two times a day w/  food as needed for back pain 13)  Ventolin Hfa 108 (90 Base) Mcg/act Aers (Albuterol sulfate) .... Inhale 1-2 puff every 6 hrs as needed as needed for shortness of breath. 14)  Azithromycin 250 Mg Tabs (Azithromycin) .... 2 tablets now then 1 tablet by mouth once daily for remainder of 5 days.  Other Orders: Tdap => 57yrs IM (86578) Admin 1st Vaccine (46962)  Patient Instructions: 1)  Please, follow up with an X-ray as instructed. 2)  Please, avoid heavy lfiting , pulling or pushing. 3)  Please, call with any questions and follow up in 2-4 weeks.   Orders Added: 1)  Est. Patient Level III [95284] 2)  T-Sed Rate (Automated) [[id number]] 3)  Diagnostic X-Ray/Fluoroscopy [Diagnostic X-Ray/Flu] 4)  T-Urinalysis Dipstick only [81003QW] 5)  Physical Therapy Referral [PT] 6)  Tdap => 29yrs IM [90715] 7)  Admin 1st Vaccine [25366]   Immunizations Administered:  Tetanus Vaccine:    Vaccine Type: Tdap    Site: left deltoid    Mfr: GlaxoSmithKline    Dose: 0.5 ml    Route: IM    Given by: Chinita Pester RN    Exp. Date: 02/12/2012    Lot #: [id number]    VIS given: 03/12/08 version given May 07, 2010.   Immunizations Administered:  Tetanus Vaccine:    Vaccine Type: Tdap    Site: left deltoid    Mfr: GlaxoSmithKline    Dose: 0.5 ml    Route: IM    Given by: Chinita Pester RN    Exp. Date: 02/12/2012    Lot #: [id number]    VIS given: 03/12/08 version given May 07, 2010.  Prevention & Chronic  Care Immunizations   Influenza vaccine: Fluvax MCR  (01/27/2010)   Influenza vaccine deferral: Deferred  (10/21/2008)   Influenza vaccine due: 12/25/2010    Tetanus booster: 05/07/2010: Tdap   Td  booster deferral: Deferred  (12/23/2009)   Tetanus booster due: 10/22/2018    Pneumococcal vaccine: Pneumovax  (03/02/2010)   Pneumococcal vaccine deferral: Deferred  (12/23/2009)   Pneumococcal vaccine due: 10/04/2026  Other Screening   Pap smear: No malignancy.  (01/28/2010)   Pap smear action/deferral: Deferred  (12/23/2009)   Pap smear due: 01/29/2012    Mammogram: ASSESSMENT: Negative - BI-RADS 1^MM DIGITAL SCREENING  (06/10/2009)   Mammogram due: 06/11/2011   Smoking status: quit > 6 months  (05/07/2010)  Lipids   Total Cholesterol: 214  (03/29/2010)   Lipid panel action/deferral: Lipid Panel ordered   LDL: 128  (03/29/2010)   LDL Direct: Not documented   HDL: 53  (03/29/2010)   Triglycerides: 165  (03/29/2010)   Lipid panel due: 03/30/2011    SGOT (AST): 28  (03/29/2010)   SGPT (ALT): 36  (03/29/2010)   Alkaline phosphatase: 97  (03/29/2010)   Total bilirubin: 0.6  (03/29/2010)   Liver panel due: 03/30/2011    Lipid flowsheet reviewed?: Yes   Progress toward LDL goal: Unchanged    Stage of readiness to change (lipid management): Maintenance  Hypertension   Last Blood Pressure: 132 / 88  (05/07/2010)   Serum creatinine: 0.67  (03/29/2010)   BMP action: Ordered   Serum potassium 3.6  (03/29/2010)   Basic metabolic panel due: 03/30/2011    Hypertension flowsheet reviewed?: Yes   Progress toward BP goal: Unchanged    Stage of readiness to change (hypertension management): Maintenance  Self-Management Support :   Personal Goals (by the next clinic visit) :      Personal blood pressure goal: 140/90  (10/21/2008)     Personal LDL goal: 130  (02/11/2009)    Patient will work on the following items until the next clinic visit to reach self-care goals:      Medications and monitoring: take my medicines every day, bring all of my medications to every visit  (05/07/2010)     Eating: use fresh or frozen vegetables, eat foods that are low in salt, eat baked foods instead of fried foods  (05/07/2010)     Activity: take a 30 minute walk every day  (05/07/2010)    Hypertension self-management support: Written self-care plan  (05/07/2010)   Hypertension self-care plan printed.    Lipid self-management support: Written self-care plan  (05/07/2010)   Lipid self-care plan printed.   Nursing Instructions: Give tetanus booster today   Process Orders Check Orders Results:     [facility]: ABN not required for this insurance Tests Sent for requisitioning (May 07, 2010 12:47 PM):     05/07/2010: [facility] -- T-Sed Rate (Automated) [phone] (signed)         Laboratory Results   Urine Tests  Date/Time Recieved: 05/07/10  1035Am Date/Time Reported: 04/1310  1035 AM  Routine Urinalysis   Color: yellow Glucose: negative   (Normal Range: Negative) Bilirubin: negative   (Normal Range: Negative) Ketone: negative   (Normal Range: Negative) Spec. Gravity: >=1.030   (Normal Range: 1.003-1.035) Blood: negative   (Normal Range: Negative) pH: 6.0   (Normal Range: 5.0-8.0) Protein: negative   (Normal Range: Negative) Urobilinogen: 0.2   (Normal Range: 0-1) Nitrite: negative   (Normal Range: Negative) Leukocyte Esterace: negative   (Normal Range: Negative)

## 2010-05-27 NOTE — Progress Notes (Signed)
Summary: confirm K+, PPA/ hla  Phone Note From Pharmacy   Summary of Call: called to confirm K+, DONE. Initial call taken by: Marin Roberts RN,  August 03, 2009 11:12 AM

## 2010-05-27 NOTE — Progress Notes (Signed)
Summary: Refill/gh  Phone Note Refill Request Message from:  Fax from Pharmacy on January 07, 2010 5:01 PM  Refills Requested: Medication #1:  AVAPRO 150 MG TABS Take 1 tablet by mouth once a day Avapro is notcovered by pt's insurance will you consider Cozaar or Diovan.  Last visit was 01/04/2010.  Last labs were 11/2009.   Method Requested: Electronic Initial call taken by: Angelina Ok RN,  January 07, 2010 5:02 PM  Follow-up for Phone Call        ok cozaar Follow-up by: Julaine Fusi  DO,  January 15, 2010 4:57 PM    New/Updated Medications: COZAAR 50 MG TABS (LOSARTAN POTASSIUM) Take 1 tablet by mouth once a day Prescriptions: COZAAR 50 MG TABS (LOSARTAN POTASSIUM) Take 1 tablet by mouth once a day  #30 x 3   Entered by:   Julaine Fusi  DO   Authorized by:   Vassie Loll MD   Signed by:   Julaine Fusi  DO on 01/15/2010   Method used:   Faxed to ...       [facility] (mail-order)       [address]       [place], [place]  [address]       Ph: [phone]       Fax: [phone]   RxID:   [id number]

## 2010-05-27 NOTE — Miscellaneous (Signed)
  Clinical Lists Changes  Observations: Added new observation of YEARAIDSPOS: 2005  (03/09/2010 16:07)

## 2010-05-27 NOTE — Miscellaneous (Signed)
Summary: 01/28/2010 PAP SMEAR - NEGATIVE  Clinical Lists Changes  Observations: Added new observation of PAP SMEAR: NEGATIVE (01/28/2010 15:21) Added new observation of LAST PAP DAT: 01/28/2010 (01/28/2010 15:21)

## 2010-05-27 NOTE — Letter (Signed)
Summary: Generic Letter  [facility]  [address]   [place], [place] [address]   Phone: [phone]  Fax: [phone]    12/24/2009  RE: [name]     [address]     [place], [place]  [address]  To Whom it may concern:  [name] [name] was seen in our office on 12/23/09 for medical reasons.  Per patient's request, this information is being forwarded to your office.  If you have any questions, please feel free to contact me directly at [phone].  Sincerely,    Cynda Familia (AAMA)

## 2010-05-27 NOTE — Progress Notes (Signed)
Summary: phone/gg  Phone Note Call from Patient   Caller: Patient Summary of Call: Pt called c/o vaginal yeast infection and stomach upset from the antibiotics she has taken. Will you order meds for both these problems?   She talked to you about this on last visit. [facility]  Initial call taken by: Merrie Roof RN,  October 08, 2009 2:16 PM  Follow-up for Phone Call        Stomach upset with antibiotics will go away when abx's therapy is over; she just need to take them with food and make sure she also take her PPI (protonix). For the yeast infection, we discussed she might develop one but was not active at that moment. Will treat with fluconazole 100mg  daily x 3 days.  Thanks!!!!!    New/Updated Medications: DIFLUCAN 100 MG TABS (FLUCONAZOLE) Take 1 tablet by mouth once a day Prescriptions: DIFLUCAN 100 MG TABS (FLUCONAZOLE) Take 1 tablet by mouth once a day  #3 x 0   Entered by:   Vassie Loll MD   Authorized by:   Merrie Roof RN   Signed by:   Vassie Loll MD on 10/09/2009   Method used:   Telephoned to ...       [facility] (mail-order)       [address]       [place], [place]  [address]       Ph: [phone]       Fax: [phone]   RxID:   [id number]   Appended Document: phone/gg Rx called in and pt informed

## 2010-05-27 NOTE — Progress Notes (Signed)
Summary: possible yeast infection  Phone Note Call from Patient   Caller: Patient Call For: Dr. Yisroel Ramming Summary of Call: Patient called stating that she has a yeast infection from the abx she took last week. Wants something [facility] in at [facility] Initial call taken by: Starleen Arms CMA,  March 23, 2010 3:35 PM  Follow-up for Phone Call        fluconazole 150mg  once daily for 2 days Follow-up by: Yisroel Ramming MD,  March 23, 2010 4:06 PM    New/Updated Medications: FLUCONAZOLE 150 MG TABS (FLUCONAZOLE) 1 tablet once daily for 2 days Prescriptions: FLUCONAZOLE 150 MG TABS (FLUCONAZOLE) 1 tablet once daily for 2 days  #2 x 0   Entered by:   Starleen Arms CMA   Authorized by:   Yisroel Ramming MD   Signed by:   Starleen Arms CMA on 03/24/2010   Method used:   Faxed to ...       [facility] (retail)       [address]       [place], [place]  [address]  [place]       Ph: [phone]       Fax: [phone]   RxID:   [id number]

## 2010-05-27 NOTE — Miscellaneous (Signed)
[facility]: [facility]   Imported By: Margie Billet 10/01/2009 10:48:58  _____________________________________________________________________  External Attachment:    Type:   Image     Comment:   External Document

## 2010-05-27 NOTE — Assessment & Plan Note (Signed)
Summary: MEDS PROBLEM   Primary Provider:  Vassie Loll MD  CC:  follow-up visit, dizziness x 1 week, twinging-type pains in chest sinceshe was started on new B/P rx by Dr. Alferd Patee, and Depression assessment completed.  History of Present Illness: 50 yo F with hx of Hep C and HIV. She had nasal septoplasty 01-05-10. Her last CD4 530 and VL <48 (08-26-09). Her breathing is much better since having septoplasty.  She has been having dizzyness to the point of feeling like she might pass out. Has been having chest pain but no SOB. Was given Rx for estrogen by [facility] for night sweats, she has not tried it yet.  Has occas R arm pain, no neck pain. Pain is sharp, feels like a flutter. Her dizzyness is greatest in the AM after she takes her BP medicine.   Depression History:      The patient denies a depressed mood most of the day and a diminished interest in her usual daily activities.         Preventive Screening-Counseling & Management  Alcohol-Tobacco     Alcohol drinks/day: 0     Smoking Status: quit > 6 months     Smoking Cessation Counseling: yes     Packs/Day: 5 ciggs     Year Started: 1983     Year Quit: 2010     Passive Smoke Exposure: no  Caffeine-Diet-Exercise     Caffeine use/day: coffee occassionally     Does Patient Exercise: yes     Type of exercise: walking     Exercise (avg: min/session): <30     Times/week: 4  Hep-HIV-STD-Contraception     HIV Risk: no risk noted     HIV Risk Counseling: not indicated-no HIV risk noted     Sun Exposure-Excessive: occasionally  Safety-Violence-Falls     Seat Belt Use: yes  Comments: given condoms      Sexual History:  currently monogamous.        Drug Use:  current and marijuana.     Updated Prior Medication List: TRUVADA 200-300 MG TABS (EMTRICITABINE-TENOFOVIR) one tab by mouth once daily LEXIVA 700 MG TABS (FOSAMPRENAVIR CALCIUM) 2 tablets by mouth once daily NORVIR 100 MG CAPS (RITONAVIR) 2 tablets by mouth once  daily CLARITIN 10 MG  TABS (LORATADINE) Take 1 tablet by mouth once a day K-VESCENT 20 MEQ PACK (POTASSIUM CHLORIDE) Take 2  tablets by mouth daily. COZAAR 50 MG TABS (LOSARTAN POTASSIUM) Take 1 tablet by mouth once a day HYDROCHLOROTHIAZIDE 25 MG TABS (HYDROCHLOROTHIAZIDE) 1 tab by mouth daily SINGULAIR 10 MG TABS (MONTELUKAST SODIUM) Take 1 tablet by mouth once a day ASMANEX 120 METERED DOSES 220 MCG/INH AEPB (MOMETASONE FUROATE) inhaled daily. NORVASC 5 MG TABS (AMLODIPINE BESYLATE) Take 1/2 tablet by mouth once a day VENTOLIN HFA 108 (90 BASE) MCG/ACT AERS (ALBUTEROL SULFATE) Inhale 2 puff every 6 hours as needed for SOB and wheezing. PRAVACHOL 40 MG TABS (PRAVASTATIN SODIUM) Take 1 tablet by mouth once a day PROTONIX 20 MG TBEC (PANTOPRAZOLE SODIUM) Take 1 tablet by mouth two times a day VICODIN 5-500 MG TABS (HYDROCODONE-ACETAMINOPHEN) Take 1 tab every 8 hours as needed for pain. NAPROSYN 500 MG TABS (NAPROXEN) Take 1 tablet by mouth two times a day w/  food as needed for back pain  Current Allergies (reviewed today): ! TYLENOL ! MORPHINE ! * DIAZIDE Past History:  Past medical, surgical, family and social histories (including risk factors) reviewed, and no changes noted (except as noted  below).  Past Medical History: Reviewed history from 05/09/2006 and no changes required. HIV disease (1994) Hepatitis C Hypertension GERD Allergic rhinitis Depression  Family History: Reviewed history from 08/26/2009 and no changes required. Family History of CAD Female 1st degree relative <60 Family History Diabetes 1st degree relative- 3 siblings Family History of Stroke F 1st degree relative <60 Family History Hypertension  Social History: Reviewed history from 09/02/2008 and no changes required. Former Smoker. Alcohol use-no Drug use-no. Drug Use:  current, marijuana  Review of Systems       wt steady, no headache   Vital Signs:  Patient profile:   50 year old  female Menstrual status:  postmenopausal Height:      61 inches (154.94 cm) Weight:      158. pounds (71.82 kg) BMI:     29.96 Temp:     98.3 degrees F (36.83 degrees C) oral Pulse rate:   96 / minute BP sitting:   128 / 83  (left arm) Cuff size:   regular  Vitals Entered By: Jennet Maduro RN (March 02, 2010 2:37 PM) CC: follow-up visit, dizziness x 1 week, twinging-type pains in chest sinceshe was started on new B/P rx by Dr. Alferd Patee, Depression assessment completed Is Patient Diabetic? No Pain Assessment Patient in pain? no      Nutritional Status BMI of 25 - 29 = overweight Nutritional Status Detail appetite "too much"  Have you ever been in a relationship where you felt threatened, hurt or afraid?No   Does patient need assistance? Functional Status Self care Ambulation Normal Comments no missed doses of rxes        Medication Adherence: 03/02/2010   Adherence to medications reviewed with patient. Counseling to provide adequate adherence provided   Prevention For Positives: 03/02/2010   Safe sex practices discussed with patient. Condoms offered.                             Physical Exam  General:  well-developed, well-nourished, well-hydrated, and overweight-appearing.   Eyes:  pupils equal, pupils round, and pupils reactive to light.   Mouth:  pharynx pink and moist and no exudates.   Neck:  no masses.  she has a "buffalo hump". no JVP Lungs:  normal respiratory effort and normal breath sounds.   Heart:  normal rate, regular rhythm, and no murmur.   Abdomen:  soft, non-tender, and normal bowel sounds.     Impression & Recommendations:  Problem # 1:  HIV DISEASE (ICD-042) she appears to be doing well. she is givne condoms. flu shot uptodate. will check her CD4 and VL today. return to clinic 2-3 months  Problem # 2:  HYPERTENSION, ESSENTIAL NOS (ICD-401.9) she is to be seen in the IM center today for this, probably to have her meds changed. a letter  is written for her to get a bus pass today.  Her updated medication list for this problem includes:    Cozaar 50 Mg Tabs (Losartan potassium) .Marland Kitchen... Take 1 tablet by mouth once a day    Hydrochlorothiazide 25 Mg Tabs (Hydrochlorothiazide) .Marland Kitchen... 1 tab by mouth daily    Norvasc 5 Mg Tabs (Amlodipine besylate) .Marland Kitchen... Take 1/2 tablet by mouth once a day  Other Orders: Pneumococcal Vaccine (60737) Admin 1st Vaccine (10626) Admin 1st Vaccine [facility]) [phone]) Est. Patient Level IV (27035)         Medication Adherence: 03/02/2010   Adherence to medications reviewed with  patient. Counseling to provide adequate adherence provided    Prevention For Positives: 03/02/2010   Safe sex practices discussed with patient. Condoms offered.                             Influenza Immunization History:    Influenza # 1:  Fluvax MCR (01/27/2010)  Pneumovax Vaccine    Vaccine Type: Pneumovax    Site: left deltoid    Mfr: Merck    Dose: 0.5 ml    Route: IM    Given by: Jennet Maduro RN    Exp. Date: 08/25/2011    Lot #: [id number]

## 2010-05-27 NOTE — Letter (Signed)
Summary: Generic Letter  [facility]  [address]   [place], [place] [address]   Phone: [phone]  Fax: [phone]    08/26/2009  [facility] [address] [place], [place]  [address]  Dear Ms. Bethel,           Sincerely,   Kathi Simpers [facility])

## 2010-05-27 NOTE — Progress Notes (Signed)
Summary: Refill/gh  Phone Note Refill Request Message from:  Fax from Pharmacy on November 09, 2009 9:18 AM  Refills Requested: Medication #1:  VICODIN 5-500 MG TABS Take 1 tab by mouth every 8 hrs as needed for pain  Method Requested: Electronic Initial call taken by: Angelina Ok RN,  November 09, 2009 9:19 AM  Follow-up for Phone Call        Refill approved-nurse to complete    Prescriptions: VICODIN 5-500 MG TABS (HYDROCODONE-ACETAMINOPHEN) Take 1 tab by mouth every 8 hrs as needed for pain  #60 x 0   Entered and Authorized by:   Vassie Loll MD   Signed by:   Vassie Loll MD on 11/09/2009   Method used:   Telephoned to ...       [facility] (mail-order)       [address]       [place], [place]  [address]       Ph: [phone]       Fax: [phone]   RxID:   [id number]

## 2010-05-27 NOTE — Letter (Signed)
Summary: [facility]  [facility]   Imported By: Shon Hough 11/13/2009 16:12:35  _____________________________________________________________________  External Attachment:    Type:   Image     Comment:   External Document

## 2010-05-27 NOTE — Assessment & Plan Note (Signed)
Summary: FU VISIT/DS   Vital Signs:  Patient profile:   50 year old female Menstrual status:  postmenopausal Weight:      154.5 pounds (70.23 kg) BMI:     29.30 Temp:     97.1 degrees F (36.17 degrees C) oral Pulse rate:   105 / minute BP sitting:   143 / 96  (left arm) Cuff size:   regular  Vitals Entered By: Cynda Familia Duncan Dull) (December 23, 2009 2:39 PM) CC: pt c/o pain and swelling in right foot s/p injury Is Patient Diabetic? No Pain Assessment Patient in pain? yes     Location: left foot Intensity: 10 Type: sharp Onset of pain  constant x 6 days s/p injury Nutritional Status BMI of 25 - 29 = overweight  Have you ever been in a relationship where you felt threatened, hurt or afraid?No   Does patient need assistance? Functional Status Self care Ambulation Wheelchair Comments pt in w/c, unable to apply weight to foot   Primary Care Provider:  Vassie Loll MD  CC:  pt c/o pain and swelling in right foot s/p injury.  History of Present Illness: 50 y/o female with pmh as described in the EMR; who comes to the clinic for followup of her chronic problems.  Patient experienced a traumatic injury on her right foot about a week ago; and now is still experiencing swelling, red up to her shin and really tender.  Patient BP and the rest of her chronic problems are well controlled and stable, she is compliant with all her meds and appointments.  Patient denies any fever, or any other complaints.  Problems Prior to Update: 1)  Symptomatic Menopausal/female Climacteric States  (ICD-627.2) 2)  Back Pain  (ICD-724.5) 3)  Vaginal Discharge  (ICD-623.5) 4)  Hyperlipidemia  (ICD-272.4) 5)  Screening For Malignant Neoplasm of The Cervix  (ICD-V76.2) 6)  Candidiasis, Vaginal  (ICD-112.1) 7)  Hypokalemia  (ICD-276.8) 8)  Family History Diabetes 1st Degree Relative  (ICD-V18.0) 9)  Family History of Cad Female 1st Degree Relative <60  (ICD-V16.49) 10)  Upper Respiratory  Infection  (ICD-465.9) 11)  Bronchitis  (ICD-490) 12)  Insomnia  (ICD-780.52) 13)  Tobacco Abuse  (ICD-305.1) 14)  Depression  (ICD-311) 15)  Allergic Rhinitis  (ICD-477.9) 16)  Hypertension  (ICD-401.9) 17)  Disorder, Depressive Nec  (ICD-311) 18)  Hepatitis C  (ICD-070.51) 19)  Rhinitis, Allergic Nos  (ICD-477.9) 20)  Gerd  (ICD-530.81) 21)  Hypertension, Essential Nos  (ICD-401.9) 22)  HIV Disease  (ICD-042)  Medications Prior to Update: 1)  Truvada 200-300 Mg Tabs (Emtricitabine-Tenofovir) .... One Tab By Mouth Once Daily 2)  Lexiva 700 Mg Tabs (Fosamprenavir Calcium) .... 2 Tablets By Mouth Once Daily 3)  Norvir 100 Mg Caps (Ritonavir) .... 2 Tablets By Mouth Once Daily 4)  Claritin 10 Mg  Tabs (Loratadine) .... Take 1 Tablet By Mouth Once A Day 5)  K-Vescent 20 Meq Pack (Potassium Chloride) .... Take 2  Tablets By Mouth Daily. 6)  Avapro 150 Mg Tabs (Irbesartan) .... Take 1 Tablet By Mouth Once A Day 7)  Hydrochlorothiazide 25 Mg Tabs (Hydrochlorothiazide) .Marland Kitchen.. 1 Tab By Mouth Daily 8)  Singulair 10 Mg Tabs (Montelukast Sodium) .... Take 1 Tablet By Mouth Once A Day 9)  Asmanex 120 Metered Doses 220 Mcg/inh Aepb (Mometasone Furoate) .... Inhaled Daily. 10)  Norvasc 5 Mg Tabs (Amlodipine Besylate) .... Take 1/2 Tablet By Mouth Once A Day 11)  Flexeril 5 Mg Tabs (Cyclobenzaprine Hcl) .Marland KitchenMarland KitchenMarland Kitchen  Take 1 Tab By Mouth At Bedtime 12)  Voltaren 1 % Gel (Diclofenac Sodium) .... Apply in Your Tender Area Twice A Day 13)  Ventolin Hfa 108 (90 Base) Mcg/act Aers (Albuterol Sulfate) .... Inhale 2 Puff Every 6 Hours As Needed For Sob and Wheezing. 14)  Vicodin 5-500 Mg Tabs (Hydrocodone-Acetaminophen) .... Take 1 Tab By Mouth Every 8 Hrs As Needed For Pain 15)  Pravachol 40 Mg Tabs (Pravastatin Sodium) .... Take 1 Tablet By Mouth Once A Day 16)  Nexium 40 Mg Cpdr (Esomeprazole Magnesium) .... Take 1 Capsule By Mouth Once A Day  Current Medications (verified): 1)  Truvada 200-300 Mg Tabs  (Emtricitabine-Tenofovir) .... One Tab By Mouth Once Daily 2)  Lexiva 700 Mg Tabs (Fosamprenavir Calcium) .... 2 Tablets By Mouth Once Daily 3)  Norvir 100 Mg Caps (Ritonavir) .... 2 Tablets By Mouth Once Daily 4)  Claritin 10 Mg  Tabs (Loratadine) .... Take 1 Tablet By Mouth Once A Day 5)  K-Vescent 20 Meq Pack (Potassium Chloride) .... Take 2  Tablets By Mouth Daily. 6)  Avapro 150 Mg Tabs (Irbesartan) .... Take 1 Tablet By Mouth Once A Day 7)  Hydrochlorothiazide 25 Mg Tabs (Hydrochlorothiazide) .Marland Kitchen.. 1 Tab By Mouth Daily 8)  Singulair 10 Mg Tabs (Montelukast Sodium) .... Take 1 Tablet By Mouth Once A Day 9)  Asmanex 120 Metered Doses 220 Mcg/inh Aepb (Mometasone Furoate) .... Inhaled Daily. 10)  Norvasc 5 Mg Tabs (Amlodipine Besylate) .... Take 1/2 Tablet By Mouth Once A Day 11)  Ventolin Hfa 108 (90 Base) Mcg/act Aers (Albuterol Sulfate) .... Inhale 2 Puff Every 6 Hours As Needed For Sob and Wheezing. 12)  Pravachol 40 Mg Tabs (Pravastatin Sodium) .... Take 1 Tablet By Mouth Once A Day 13)  Nexium 40 Mg Cpdr (Esomeprazole Magnesium) .... Take 1 Capsule By Mouth Once A Day  Allergies (verified): 1)  ! Tylenol 2)  ! Morphine 3)  ! * Diazide  Past History:  Past Medical History: Last updated: 05/09/2006 HIV disease (1994) Hepatitis C Hypertension GERD Allergic rhinitis Depression  Family History: Last updated: 08/26/2009 Family History of CAD Female 1st degree relative <60 Family History Diabetes 1st degree relative- 3 siblings Family History of Stroke F 1st degree relative <60 Family History Hypertension  Social History: Last updated: 09/02/2008 Former Smoker. Alcohol use-no Drug use-no.  Risk Factors: Alcohol Use: 0 (10/05/2009) Caffeine Use: coffee occassionally (10/05/2009) Exercise: yes (10/05/2009)  Risk Factors: Smoking Status: quit > 6 months (10/05/2009) Packs/Day: 5 ciggs (10/05/2009) Passive Smoke Exposure: no (10/05/2009)  Review of Systems        As per HPI.  Physical Exam  General:  alert, well-hydrated, cooperative to examination, overweight-appearing and complaining of pain on her right foot; unable to bear weight on it..   Lungs:  normal breath sounds, no crackles, and no wheezes.   Heart:  normal rate, regular rhythm, no murmur, and no gallop.   Abdomen:  Bowel sounds positive,abdomen soft and non-tender without masses, organomegaly or hernias noted. Extremities:  Right foot mildly swollen and with erythema up to her ankle. Decreased range of motion (especially lateral rotation). Wound on top of her foot is healing and currently not having any drainage or discharge. Neurologic:  No cranial nerve deficits noted. Using wheelchair today for ambulation. Patient is alert & oriented X3.     Impression & Recommendations:  Problem # 1:  PAIN IN JOINT, ANKLE AND FOOT (ICD-719.47) Secondary to trauma (after heavy object fell on top of  her foot while taking a shower). X-ray was checked today and demonstarted not fracture and just mild soft tissue swelling. Patient most likely having some muscle swelling after trauma. Will treat with 10 days of diclofenac (to treat pain and inflamation); patient was also advised to apply cold compresses in the area and received crutches for ambulation w/o bearing weight on her foot.  Orders: Diagnostic X-Ray/Fluoroscopy (Diagnostic X-Ray/Flu)  Problem # 2:  CELLULITIS, FOOT, RIGHT (ICD-682.7) Patient redeness and swelling after open wound for the last 5 days, rise concerns for cellulitis. Will check basic labs and will treat with doxycycline for 10 days. Due to hx of candidiasis after antibiotics will give prescription of fluconazole to be taken for 3 days after antibiotics tx is over.  Her updated medication list for this problem includes:    Doxycycline Hyclate 100 Mg Tabs (Doxycycline hyclate) .Marland Kitchen... Take 1 tablet by mouth two times a day  Orders: T-CBC w/Diff ([id number])  Problem # 3:  BACK  PAIN (ICD-724.5) Improving with PT; today was not having any pain. will continue PT and she will benefits of NSAID therapy for her foot to treat any associated pain in her back as well.  The following medications were removed from the medication list:    Flexeril 5 Mg Tabs (Cyclobenzaprine hcl) .Marland Kitchen... Take 1 tab by mouth at bedtime    Vicodin 5-500 Mg Tabs (Hydrocodone-acetaminophen) .Marland Kitchen... Take 1 tab by mouth every 8 hrs as needed for pain Her updated medication list for this problem includes:    Diclofenac Sodium 75 Mg Tbec (Diclofenac sodium) .Marland Kitchen... Take 1 tablet by mouth two times a day  Problem # 4:  HYPERLIPIDEMIA (ICD-272.4) LFT's within normal limits. Will continue pravachol 40mg  daily and will advise her to follow a low fat diet. LDL was 186 in May, at that time therapy with statins was started; will recheck lipid profile in October.  Her updated medication list for this problem includes:    Pravachol 40 Mg Tabs (Pravastatin sodium) .Marland Kitchen... Take 1 tablet by mouth once a day  Labs Reviewed: SGOT: 18 (10/05/2009)   SGPT: 23 (10/05/2009)   HDL:63 (08/26/2009), 50 (05/22/2009)  LDL:186 (08/26/2009), 152 (05/22/2009)  Chol:270 (08/26/2009), 232 (05/22/2009)  Trig:105 (08/26/2009), 149 (05/22/2009)  Problem # 5:  HYPOKALEMIA (ICD-276.8) Persistent secondary to the use of diuretics. Will continue maintenance dose. Potassium prescription will be refill. CMET demonstarted potassium level to be 3.4  Problem # 6:  HYPERTENSION (ICD-401.9) Well controlled; and milsdly elevated today due to pain. Will continue current therapy and will advised her to follow a low sodium diet. Renal function and electrolytes WNL.  Her updated medication list for this problem includes:    Avapro 150 Mg Tabs (Irbesartan) .Marland Kitchen... Take 1 tablet by mouth once a day    Hydrochlorothiazide 25 Mg Tabs (Hydrochlorothiazide) .Marland Kitchen... 1 tab by mouth daily    Norvasc 5 Mg Tabs (Amlodipine besylate) .Marland Kitchen... Take 1/2 tablet by mouth  once a day  Problem # 7:  HIV DISEASE (ICD-042) Stable and well controlled. Patient with excellent medication compliance. Last Cd 4 count above 500. She will continue close followup with her ID docotor.  Problem # 8:  RHINITIS, ALLERGIC NOS (ICD-477.9) Stable. But suffering multiple sinus infections. accordingly to patient, she will required sinuses surgery per ENT. Will continue claritin for now; will also recommend her to follow instructions and meds prescribed by ENT.  Her updated medication list for this problem includes:    Claritin 10 Mg Tabs (Loratadine) .Marland KitchenMarland KitchenMarland KitchenMarland Kitchen  Take 1 tablet by mouth once a day  Problem # 9:  GERD (ICD-530.81) Well controlled with nexium. Patient will continue same regimmen.  Her updated medication list for this problem includes:    Nexium 40 Mg Cpdr (Esomeprazole magnesium) .Marland Kitchen... Take 1 capsule by mouth once a day  Complete Medication List: 1)  Truvada 200-300 Mg Tabs (Emtricitabine-tenofovir) .... One tab by mouth once daily 2)  Lexiva 700 Mg Tabs (Fosamprenavir calcium) .... 2 tablets by mouth once daily 3)  Norvir 100 Mg Caps (Ritonavir) .... 2 tablets by mouth once daily 4)  Claritin 10 Mg Tabs (Loratadine) .... Take 1 tablet by mouth once a day 5)  K-vescent 20 Meq Pack (Potassium chloride) .... Take 2  tablets by mouth daily. 6)  Avapro 150 Mg Tabs (Irbesartan) .... Take 1 tablet by mouth once a day 7)  Hydrochlorothiazide 25 Mg Tabs (Hydrochlorothiazide) .Marland Kitchen.. 1 tab by mouth daily 8)  Singulair 10 Mg Tabs (Montelukast sodium) .... Take 1 tablet by mouth once a day 9)  Asmanex 120 Metered Doses 220 Mcg/inh Aepb (Mometasone furoate) .... Inhaled daily. 10)  Norvasc 5 Mg Tabs (Amlodipine besylate) .... Take 1/2 tablet by mouth once a day 11)  Ventolin Hfa 108 (90 Base) Mcg/act Aers (Albuterol sulfate) .... Inhale 2 puff every 6 hours as needed for sob and wheezing. 12)  Pravachol 40 Mg Tabs (Pravastatin sodium) .... Take 1 tablet by mouth once a day 13)   Nexium 40 Mg Cpdr (Esomeprazole magnesium) .... Take 1 capsule by mouth once a day 14)  Diclofenac Sodium 75 Mg Tbec (Diclofenac sodium) .... Take 1 tablet by mouth two times a day 15)  Fluconazole 200 Mg Tabs (Fluconazole) .... Take 1 tablet by mouth once a day 16)  Doxycycline Hyclate 100 Mg Tabs (Doxycycline hyclate) .... Take 1 tablet by mouth two times a day  Other Orders: T-Basic Metabolic Panel [id number])  Patient Instructions: 1)  Followup in 10 days. 2)  Take medications as prescribed. 3)  Remember to use your antifungal prescription once antibiotics are done, if you develop yeast infection. 4)  Take your antibiotic as prescribed until ALL of it is gone, but stop if you develop a rash or swelling and contact our office as soon as possible. 5)  Follow low sodium and low fat diet. 6)  You will be called with any abnormalities in the tests scheduled or performed today.  If you don't hear from [place] within a week from when the test was performed, you can assume that your test was normal. 7)  Keep your leg elevated as much as possible and apply cold compresses at least 2-3 times a day (leave it on for 10-15 minutes). Prescriptions: DOXYCYCLINE HYCLATE 100 MG TABS (DOXYCYCLINE HYCLATE) Take 1 tablet by mouth two times a day  #20 x 0   Entered and Authorized by:   Vassie Loll MD   Signed by:   Vassie Loll MD on 12/23/2009   Method used:   Faxed to ...       [facility] (mail-order)       [address]       [place], [place]  [address]       Ph: [phone]       Fax: [phone]   RxID:   [id number] FLUCONAZOLE 200 MG TABS (FLUCONAZOLE) Take 1 tablet by mouth once a day  #3 x 0   Entered and Authorized by:   Vassie Loll MD  Signed by:   Vassie Loll MD on 12/23/2009   Method used:   Faxed to ...       [facility] (mail-order)       [address]       [place], [place]  [address]       Ph: [phone]       Fax: [phone]   RxID:    [id number] DICLOFENAC SODIUM 75 MG TBEC (DICLOFENAC SODIUM) Take 1 tablet by mouth two times a day  #20 x 0   Entered and Authorized by:   Vassie Loll MD   Signed by:   Vassie Loll MD on 12/23/2009   Method used:   Faxed to ...       [facility] (mail-order)       [address]       [place], [place]  [address]       Ph: [phone]       Fax: [phone]   RxID:   [id number]  Process Orders Check Orders Results:     [facility]: ABN not required for this insurance Tests Sent for requisitioning (December 24, 2009 8:32 PM):     12/23/2009: [facility] -- T-Basic Metabolic Panel [id number] (signed)     12/23/2009: [facility] -- T-CBC w/Diff [[id number]] (signed)     Prevention & Chronic Care Immunizations   Influenza vaccine: Fluvax Non-MCR  (01/27/2009)   Influenza vaccine deferral: Deferred  (10/21/2008)   Influenza vaccine due: 12/25/2010    Tetanus booster: Not documented   Td booster deferral: Deferred  (12/23/2009)   Tetanus booster due: 10/22/2018    Pneumococcal vaccine: Historical  (04/05/2004)   Pneumococcal vaccine deferral: Deferred  (12/23/2009)   Pneumococcal vaccine due: 04/05/2009  Other Screening   Pap smear: NEGATIVE FOR INTRAEPITHELIAL LESIONS OR MALIGNANCY.  (06/09/2008)   Pap smear action/deferral: Deferred  (12/23/2009)   Pap smear due: 06/09/2009    Mammogram: ASSESSMENT: Negative - BI-RADS 1^MM DIGITAL SCREENING  (06/10/2009)   Mammogram due: 06/10/2010   Smoking status: quit > 6 months  (10/05/2009)    Screening comments: Patient will received Pap smear at [facility] on Sep 9th.  Lipids   Total Cholesterol: 270  (08/26/2009)   Lipid panel action/deferral: Lipid Panel ordered   LDL: 186  (08/26/2009)   LDL Direct: Not documented   HDL: 63  (08/26/2009)   Triglycerides: 105  (08/26/2009)    SGOT (AST): 18  (10/05/2009)   SGPT (ALT): 23   (10/05/2009)   Alkaline phosphatase: 100  (10/05/2009)   Total bilirubin: 0.3  (10/05/2009)    Lipid flowsheet reviewed?: Yes   Progress toward LDL goal: Unchanged  Hypertension   Last Blood Pressure: 143 / 96  (12/23/2009)   Serum creatinine: 0.68  (10/05/2009)   BMP action: Ordered   Serum potassium 4.1  (10/05/2009)    Hypertension flowsheet reviewed?: Yes   Progress toward BP goal: At goal  Self-Management Support :   Personal Goals (by the next clinic visit) :      Personal blood pressure goal: 140/90  (10/21/2008)     Personal LDL goal: 130  (02/11/2009)    Patient will work on the following items until the next clinic visit to reach self-care goals:     Medications and monitoring: take my medicines every day  (12/23/2009)     Eating: drink diet soda or water instead of juice or soda, eat more vegetables, use fresh or  frozen vegetables, eat baked foods instead of fried foods  (12/23/2009)     Activity: take a 30 minute walk every day, take the stairs instead of the elevator, park at the far end of the parking lot  (12/23/2009)    Hypertension self-management support: Written self-care plan  (12/23/2009)   Hypertension self-care plan printed.    Lipid self-management support: Written self-care plan  (12/23/2009)   Lipid self-care plan printed.   Nursing Instructions: Give Flu vaccine today

## 2010-05-27 NOTE — Letter (Signed)
Summary: [facility]  [address]   [place], [place] [address]   Phone: [phone]  Fax: [phone]    03/02/2010  [name] [name] [address] [place], [place]  [address]   TO whom it may concern,  [name] [name] is seen frequently at the [facility] and requires transportation assistance.  She has HIV as well as difficult to control blood pressure.   Thank you so much for your assistance,   Sincerely,   Johny Sax MD

## 2010-05-27 NOTE — Assessment & Plan Note (Signed)
Summary: est-ck/fu/meds/cfb   Vital Signs:  Patient profile:   50 year old female Menstrual status:  postmenopausal Height:      61 inches (154.94 cm) Weight:      158.7 pounds (72.14 kg) BMI:     30.09 Temp:     97.8 degrees F (36.56 degrees C) oral Pulse rate:   79 / minute BP sitting:   150 / 80  (left arm) Cuff size:   regular  Vitals Entered By: Theotis Barrio NT II (October 05, 2009 3:00 PM) CC: BACK PAIN #8 -ULTRAM IS NOT WORKING   Is Patient Diabetic? No Pain Assessment Patient in pain? yes     Location: back Intensity:        8 Onset of pain  FEB   2011 Nutritional Status BMI of > 30 = obese  Have you ever been in a relationship where you felt threatened, hurt or afraid?No   Does patient need assistance? Functional Status Self care Ambulation Normal Comments BACK PAIN # 8 -N ULTRAM IS NOT WORKING   Primary Care Provider:  Vassie Loll MD  CC:  BACK PAIN #8 -ULTRAM IS NOT WORKING  .  History of Present Illness: 50 y/o female with pmh as described on the EMr; who comes to the clinic for followup and also complaining of back pain that has not been responding to ultram.  Patient has also been having some problems with her allergy and follow with Dr. Ezzard Standing who found her to has a sinus infection and some paranasal inflamation. Patient finish steroids tapering and antibiotics regimen.  She is compliant with her medications and her low sodium diet. BP has been on the high side recently most likely due to steroids and pain.  Preventive Screening-Counseling & Management  Alcohol-Tobacco     Alcohol drinks/day: 0     Smoking Status: quit > 6 months     Smoking Cessation Counseling: yes     Packs/Day: 5 ciggs     Year Started: 1983     Year Quit: 2010     Passive Smoke Exposure: no  Caffeine-Diet-Exercise     Caffeine use/day: coffee occassionally     Does Patient Exercise: yes     Type of exercise: walking     Exercise (avg: min/session): <30     Times/week:  4  Problems Prior to Update: 1)  Symptomatic Menopausal/female Climacteric States  (ICD-627.2) 2)  Back Pain  (ICD-724.5) 3)  Vaginal Discharge  (ICD-623.5) 4)  Hyperlipidemia  (ICD-272.4) 5)  Screening For Malignant Neoplasm of The Cervix  (ICD-V76.2) 6)  Candidiasis, Vaginal  (ICD-112.1) 7)  Hypokalemia  (ICD-276.8) 8)  Family History Diabetes 1st Degree Relative  (ICD-V18.0) 9)  Family History of Cad Female 1st Degree Relative <60  (ICD-V16.49) 10)  Upper Respiratory Infection  (ICD-465.9) 11)  Bronchitis  (ICD-490) 12)  Insomnia  (ICD-780.52) 13)  Tobacco Abuse  (ICD-305.1) 14)  Depression  (ICD-311) 15)  Allergic Rhinitis  (ICD-477.9) 16)  Hypertension  (ICD-401.9) 17)  Disorder, Depressive Nec  (ICD-311) 18)  Hepatitis C  (ICD-070.51) 19)  Rhinitis, Allergic Nos  (ICD-477.9) 20)  Gerd  (ICD-530.81) 21)  Hypertension, Essential Nos  (ICD-401.9) 22)  HIV Disease  (ICD-042)  Current Problems (verified): 1)  Symptomatic Menopausal/female Climacteric States  (ICD-627.2) 2)  Back Pain  (ICD-724.5) 3)  Vaginal Discharge  (ICD-623.5) 4)  Hyperlipidemia  (ICD-272.4) 5)  Screening For Malignant Neoplasm of The Cervix  (ICD-V76.2) 6)  Candidiasis, Vaginal  (ICD-112.1) 7)  Hypokalemia  (ICD-276.8) 8)  Family History Diabetes 1st Degree Relative  (ICD-V18.0) 9)  Family History of Cad Female 1st Degree Relative <60  (ICD-V16.49) 10)  Upper Respiratory Infection  (ICD-465.9) 11)  Bronchitis  (ICD-490) 12)  Insomnia  (ICD-780.52) 13)  Tobacco Abuse  (ICD-305.1) 14)  Depression  (ICD-311) 15)  Allergic Rhinitis  (ICD-477.9) 16)  Hypertension  (ICD-401.9) 17)  Disorder, Depressive Nec  (ICD-311) 18)  Hepatitis C  (ICD-070.51) 19)  Rhinitis, Allergic Nos  (ICD-477.9) 20)  Gerd  (ICD-530.81) 21)  Hypertension, Essential Nos  (ICD-401.9) 22)  HIV Disease  (ICD-042)  Medications Prior to Update: 1)  Truvada 200-300 Mg Tabs (Emtricitabine-Tenofovir) .... One Tab By Mouth Once  Daily 2)  Lexiva 700 Mg Tabs (Fosamprenavir Calcium) .... 2 Tablets By Mouth Once Daily 3)  Norvir 100 Mg Caps (Ritonavir) .... 2 Tablets By Mouth Once Daily 4)  Claritin 10 Mg  Tabs (Loratadine) .... Take 1 Tablet By Mouth Once A Day 5)  Protonix 40 Mg Tbec (Pantoprazole Sodium) .... Take 1 Tablet By Mouth Two Times A Day 6)  K-Vescent 20 Meq Pack (Potassium Chloride) .... Take 2  Tablets By Mouth Daily. 7)  Avapro 150 Mg Tabs (Irbesartan) .... Take 1 Tablet By Mouth Once A Day 8)  Hydrochlorothiazide 25 Mg Tabs (Hydrochlorothiazide) .Marland Kitchen.. 1 Tab By Mouth Daily 9)  Singulair 10 Mg Tabs (Montelukast Sodium) .... Take 1 Tablet By Mouth Once A Day 10)  Asmanex 120 Metered Doses 220 Mcg/inh Aepb (Mometasone Furoate) .... Inhaled Daily. 11)  Norvasc 5 Mg Tabs (Amlodipine Besylate) .... Take 1/2 Tablet By Mouth Once A Day 12)  Flexeril 5 Mg Tabs (Cyclobenzaprine Hcl) .... Take 1 Tab By Mouth At Bedtime 13)  Voltaren 1 % Gel (Diclofenac Sodium) .... Apply in Your Tender Area Twice A Day 14)  Ventolin Hfa 108 (90 Base) Mcg/act Aers (Albuterol Sulfate) .... Inhale 2 Puff Every 6 Hours As Needed For Sob and Wheezing.  Current Medications (verified): 1)  Truvada 200-300 Mg Tabs (Emtricitabine-Tenofovir) .... One Tab By Mouth Once Daily 2)  Lexiva 700 Mg Tabs (Fosamprenavir Calcium) .... 2 Tablets By Mouth Once Daily 3)  Norvir 100 Mg Caps (Ritonavir) .... 2 Tablets By Mouth Once Daily 4)  Claritin 10 Mg  Tabs (Loratadine) .... Take 1 Tablet By Mouth Once A Day 5)  Protonix 40 Mg Tbec (Pantoprazole Sodium) .... Take 1 Tablet By Mouth Two Times A Day 6)  K-Vescent 20 Meq Pack (Potassium Chloride) .... Take 2  Tablets By Mouth Daily. 7)  Avapro 150 Mg Tabs (Irbesartan) .... Take 1 Tablet By Mouth Once A Day 8)  Hydrochlorothiazide 25 Mg Tabs (Hydrochlorothiazide) .Marland Kitchen.. 1 Tab By Mouth Daily 9)  Singulair 10 Mg Tabs (Montelukast Sodium) .... Take 1 Tablet By Mouth Once A Day 10)  Asmanex 120 Metered  Doses 220 Mcg/inh Aepb (Mometasone Furoate) .... Inhaled Daily. 11)  Norvasc 5 Mg Tabs (Amlodipine Besylate) .... Take 1/2 Tablet By Mouth Once A Day 12)  Flexeril 5 Mg Tabs (Cyclobenzaprine Hcl) .... Take 1 Tab By Mouth At Bedtime 13)  Voltaren 1 % Gel (Diclofenac Sodium) .... Apply in Your Tender Area Twice A Day 14)  Ventolin Hfa 108 (90 Base) Mcg/act Aers (Albuterol Sulfate) .... Inhale 2 Puff Every 6 Hours As Needed For Sob and Wheezing.  Allergies (verified): 1)  ! Tylenol 2)  ! Morphine 3)  ! * Diazide  Past History:  Past Medical History: Last updated: 05/09/2006 HIV disease (  1994) Hepatitis C Hypertension GERD Allergic rhinitis Depression  Family History: Last updated: 08/26/2009 Family History of CAD Female 1st degree relative <60 Family History Diabetes 1st degree relative- 3 siblings Family History of Stroke F 1st degree relative <60 Family History Hypertension  Social History: Last updated: 09/02/2008 Former Smoker. Alcohol use-no Drug use-no.  Risk Factors: Alcohol Use: 0 (10/05/2009) Caffeine Use: coffee occassionally (10/05/2009) Exercise: yes (10/05/2009)  Risk Factors: Smoking Status: quit > 6 months (10/05/2009) Packs/Day: 5 ciggs (10/05/2009) Passive Smoke Exposure: no (10/05/2009)  Review of Systems  The patient denies chest pain, dyspnea on exertion, hemoptysis, abdominal pain, melena, hematochezia, and muscle weakness.    Physical Exam  General:  alert, well-developed, well-nourished, and well-hydrated.   Lungs:  normal breath sounds, no crackles, and no wheezes.   Heart:  normal rate, regular rhythm, no murmur, and no gallop.   Abdomen:  Bowel sounds positive,abdomen soft and non-tender without masses, organomegaly or hernias noted. Msk:  pain on lower back with palpation and certain movements. No crepitation. Neurologic:  No cranial nerve deficits noted. Station and gait are normal. Sensory, motor and coordinative functions  appear intact. Patient is alert & oriented X3.     Impression & Recommendations:  Problem # 1:  BACK PAIN (ICD-724.5) Pain is becoming chronic after she fall out of bed. currently pain is not responding to the use of tramadol and flexeril and is limiting her daily function. Will provide a short course of vicodin to help with acute pain and will referred her to physical therapy for evaluation and treatment. Will follow her symptoms.  Her updated medication list for this problem includes:    Flexeril 5 Mg Tabs (Cyclobenzaprine hcl) .Marland Kitchen... Take 1 tab by mouth at bedtime    Vicodin 5-500 Mg Tabs (Hydrocodone-acetaminophen) .Marland Kitchen... Take 1 tab by mouth every 8 hrs as needed for pain  Orders: Physical Therapy Referral (PT)  Problem # 2:  HYPERLIPIDEMIA (ICD-272.4) Most recent LDL was 186. Will start pravachol 40mg  daily and will advise her to follow a low fta diet. We have checked her LFT's today and they are WNL.  Her updated medication list for this problem includes:    Pravachol 40 Mg Tabs (Pravastatin sodium) .Marland Kitchen... Take 1 tablet by mouth once a day  Problem # 3:  HYPOKALEMIA (ICD-276.8) Will check potassium level during labwork today and will continue daily potassium maintenance therapy.  Problem # 4:  HYPERTENSION (ICD-401.9) BP well controlled with this regimen; except during last couple of days when she has been taking prednisone for her allergy and sinus problem. Will recommend to follow a low sodium diet; will continue current regimen and will follow BP during next visit.   Her updated medication list for this problem includes:    Avapro 150 Mg Tabs (Irbesartan) .Marland Kitchen... Take 1 tablet by mouth once a day    Hydrochlorothiazide 25 Mg Tabs (Hydrochlorothiazide) .Marland Kitchen... 1 tab by mouth daily    Norvasc 5 Mg Tabs (Amlodipine besylate) .Marland Kitchen... Take 1/2 tablet by mouth once a day  Problem # 5:  GERD (ICD-530.81) Stable and well controlled. Will continue using protonix as previously directed.  Her  updated medication list for this problem includes:    Protonix 40 Mg Tbec (Pantoprazole sodium) .Marland Kitchen... Take 1 tablet by mouth two times a day  Problem # 6:  HIV DISEASE (ICD-042) CD4 count in the 530 range and viralload <48; she will continue current regimen and she will continue following with Dr. Ninetta Lights her ID doctor  for further evaluation and treatment.  Complete Medication List: 1)  Truvada 200-300 Mg Tabs (Emtricitabine-tenofovir) .... One tab by mouth once daily 2)  Lexiva 700 Mg Tabs (Fosamprenavir calcium) .... 2 tablets by mouth once daily 3)  Norvir 100 Mg Caps (Ritonavir) .... 2 tablets by mouth once daily 4)  Claritin 10 Mg Tabs (Loratadine) .... Take 1 tablet by mouth once a day 5)  Protonix 40 Mg Tbec (Pantoprazole sodium) .... Take 1 tablet by mouth two times a day 6)  K-vescent 20 Meq Pack (Potassium chloride) .... Take 2  tablets by mouth daily. 7)  Avapro 150 Mg Tabs (Irbesartan) .... Take 1 tablet by mouth once a day 8)  Hydrochlorothiazide 25 Mg Tabs (Hydrochlorothiazide) .Marland Kitchen.. 1 tab by mouth daily 9)  Singulair 10 Mg Tabs (Montelukast sodium) .... Take 1 tablet by mouth once a day 10)  Asmanex 120 Metered Doses 220 Mcg/inh Aepb (Mometasone furoate) .... Inhaled daily. 11)  Norvasc 5 Mg Tabs (Amlodipine besylate) .... Take 1/2 tablet by mouth once a day 12)  Flexeril 5 Mg Tabs (Cyclobenzaprine hcl) .... Take 1 tab by mouth at bedtime 13)  Voltaren 1 % Gel (Diclofenac sodium) .... Apply in your tender area twice a day 14)  Ventolin Hfa 108 (90 Base) Mcg/act Aers (Albuterol sulfate) .... Inhale 2 puff every 6 hours as needed for sob and wheezing. 15)  Vicodin 5-500 Mg Tabs (Hydrocodone-acetaminophen) .... Take 1 tab by mouth every 8 hrs as needed for pain 16)  Pravachol 40 Mg Tabs (Pravastatin sodium) .... Take 1 tablet by mouth once a day  Other Orders: T-Comprehensive Metabolic Panel ([id number])  Patient Instructions: 1)  Please schedule a follow-up appointment  in 3 months. 2)  Keep yourself active, but avoid painful activities. 3)  Follow a low sodium diet and a low fat diet. 4)  Follow physical therapy instructions and recommendation. 5)  You will be called with any abnormalities in the tests scheduled or performed today.  If you don't hear from [place] within a week from when the test was performed, you can assume that your test was normal. Prescriptions: PRAVACHOL 40 MG TABS (PRAVASTATIN SODIUM) Take 1 tablet by mouth once a day  #31 x 3   Entered and Authorized by:   Vassie Loll MD   Signed by:   Vassie Loll MD on 10/05/2009   Method used:   Print then Give to Patient   RxID:   [id number] VICODIN 5-500 MG TABS (HYDROCODONE-ACETAMINOPHEN) Take 1 tab by mouth every 8 hrs as needed for pain  #60 x 0   Entered and Authorized by:   Vassie Loll MD   Signed by:   Vassie Loll MD on 10/05/2009   Method used:   Print then Give to Patient   RxID:   [id number]  Process Orders Check Orders Results:     [facility]: ABN not required for this insurance Tests Sent for requisitioning (October 06, 2009 8:36 AM):     10/05/2009: [facility] -- T-Comprehensive Metabolic Panel [[id number]] (signed)    Prevention & Chronic Care Immunizations   Influenza vaccine: Fluvax Non-MCR  (01/27/2009)   Influenza vaccine deferral: Deferred  (10/21/2008)   Influenza vaccine due: 12/24/2008    Tetanus booster: Not documented   Tetanus booster due: 10/22/2018    Pneumococcal vaccine: Historical  (04/05/2004)   Pneumococcal vaccine deferral: Deferred  (10/21/2008)   Pneumococcal vaccine due: 04/05/2009  Other Screening   Pap smear: NEGATIVE FOR INTRAEPITHELIAL LESIONS  OR MALIGNANCY.  (06/09/2008)   Pap smear due: 06/09/2009    Mammogram: ASSESSMENT: Negative - BI-RADS 1^MM DIGITAL SCREENING  (06/10/2009)   Mammogram due: 04/09/2009   Smoking status: quit > 6 months  (10/05/2009)  Lipids   Total Cholesterol: 270   (08/26/2009)   Lipid panel action/deferral: Lipid Panel ordered   LDL: 186  (08/26/2009)   LDL Direct: Not documented   HDL: 63  (08/26/2009)   Triglycerides: 105  (08/26/2009)    SGOT (AST): 45  (08/26/2009)   SGPT (ALT): 49  (08/26/2009) CMP ordered    Alkaline phosphatase: 103  (08/26/2009)   Total bilirubin: 0.4  (08/26/2009)  Hypertension   Last Blood Pressure: 150 / 80  (10/05/2009)   Serum creatinine: 0.64  (08/26/2009)   BMP action: Ordered   Serum potassium 3.7  (08/26/2009) CMP ordered   Self-Management Support :   Personal Goals (by the next clinic visit) :      Personal blood pressure goal: 140/90  (10/21/2008)     Personal LDL goal: 130  (02/11/2009)    Patient will work on the following items until the next clinic visit to reach self-care goals:     Medications and monitoring: take my medicines every day, bring all of my medications to every visit  (10/05/2009)     Eating: drink diet soda or water instead of juice or soda, eat more vegetables, use fresh or frozen vegetables, eat foods that are low in salt, eat baked foods instead of fried foods, eat fruit for snacks and desserts, limit or avoid alcohol  (10/05/2009)     Activity: take a 30 minute walk every day  (10/05/2009)    Hypertension self-management support: Resources for patients handout  (10/05/2009)    Lipid self-management support: Resources for patients handout  (10/05/2009)        Resource handout printed.   Appended Document: est-ck/fu/meds/cfb Rx called into [facility] - they will get the hard copy for the Vicodin when they deliver meds.

## 2010-05-27 NOTE — Medication Information (Signed)
Summary: [facility]: RX  [facility]: RX   Imported By: Florinda Marker 11/17/2009 09:54:01  _____________________________________________________________________  External Attachment:    Type:   Image     Comment:   External Document

## 2010-05-27 NOTE — Assessment & Plan Note (Signed)
Summary: ACUTE/[facility]/10 F/U VISIT/CH   Vital Signs:  Patient profile:   50 year old female Menstrual status:  postmenopausal Height:      61 inches (154.94 cm) Weight:      155.06 pounds (70.48 kg) BMI:     29.40 Temp:     97.2 degrees F (36.22 degrees C) oral Pulse rate:   89 / minute BP sitting:   152 / 99  (right arm) Cuff size:   regular  Vitals Entered By: Angelina Ok RN (January 04, 2010 10:01 AM)  Is Patient Diabetic? No Pain Assessment Patient in pain? yes     Location: head Intensity: 6 Type: aching Onset of pain  Constant Nutritional Status BMI of 25 - 29 = overweight  Have you ever been in a relationship where you felt threatened, hurt or afraid?No   Does patient need assistance? Functional Status Self care Ambulation Normal Comments Surgery tommorow on Sinuses.  Recheck of foot.  Swollen last week.  Go over Xrays.   Primary Care Provider:  Vassie Loll MD   History of Present Illness: 50 y/o female with pmh as described in the EMR; who comes to the clinic for followup of her chronic problems.  Patient experienced a traumatic injury on her right foot about after which she developed erythema, swelling and tenderness of her right shin c/w cellulitis.  Pt feels symptoms are resolved after tx with NSAIDS and doxyclycline for cellulitis.   Pt denies fevers, chills, lower extremity pain, swelling, erythema, skin lesions, nausea, vomiting, diarrhea, abdominal pain, chest pain,sob, or other complaint.  Pt has sinus surgery scheduled tomorrow.    Patient BP and the rest of her chronic problems are well controlled and stable, she is compliant with all her meds and appointments.  Patient denies any fever, or any other complaints.  Depression History:      The patient denies a depressed mood most of the day and a diminished interest in her usual daily activities.         Preventive Screening-Counseling & Management  Alcohol-Tobacco     Alcohol drinks/day:  0     Smoking Status: quit > 6 months     Smoking Cessation Counseling: yes     Packs/Day: 5 ciggs     Year Started: 1983     Year Quit: 2010     Passive Smoke Exposure: no  Current Medications (verified): 1)  Truvada 200-300 Mg Tabs (Emtricitabine-Tenofovir) .... One Tab By Mouth Once Daily 2)  Lexiva 700 Mg Tabs (Fosamprenavir Calcium) .... 2 Tablets By Mouth Once Daily 3)  Norvir 100 Mg Caps (Ritonavir) .... 2 Tablets By Mouth Once Daily 4)  Claritin 10 Mg  Tabs (Loratadine) .... Take 1 Tablet By Mouth Once A Day 5)  K-Vescent 20 Meq Pack (Potassium Chloride) .... Take 2  Tablets By Mouth Daily. 6)  Avapro 150 Mg Tabs (Irbesartan) .... Take 1 Tablet By Mouth Once A Day 7)  Hydrochlorothiazide 25 Mg Tabs (Hydrochlorothiazide) .Marland Kitchen.. 1 Tab By Mouth Daily 8)  Singulair 10 Mg Tabs (Montelukast Sodium) .... Take 1 Tablet By Mouth Once A Day 9)  Asmanex 120 Metered Doses 220 Mcg/inh Aepb (Mometasone Furoate) .... Inhaled Daily. 10)  Norvasc 5 Mg Tabs (Amlodipine Besylate) .... Take 1/2 Tablet By Mouth Once A Day 11)  Ventolin Hfa 108 (90 Base) Mcg/act Aers (Albuterol Sulfate) .... Inhale 2 Puff Every 6 Hours As Needed For Sob and Wheezing. 12)  Pravachol 40 Mg Tabs (Pravastatin Sodium) .... Take  1 Tablet By Mouth Once A Day 13)  Nexium 40 Mg Cpdr (Esomeprazole Magnesium) .... Take 1 Capsule By Mouth Once A Day  Allergies (verified): 1)  ! Tylenol 2)  ! Morphine 3)  ! * Diazide  Past History:  Past medical, surgical, family and social histories (including risk factors) reviewed for relevance to current acute and chronic problems.  Past Medical History: Reviewed history from 05/09/2006 and no changes required. HIV disease (1994) Hepatitis C Hypertension GERD Allergic rhinitis Depression  Family History: Reviewed history from 08/26/2009 and no changes required. Family History of CAD Female 1st degree relative <60 Family History Diabetes 1st degree relative- 3  siblings Family History of Stroke F 1st degree relative <60 Family History Hypertension  Social History: Reviewed history from 09/02/2008 and no changes required. Former Smoker. Alcohol use-no Drug use-no.  Physical Exam  General:  alert, well-hydrated, cooperative to examination, well-developed, well-nourished, and well-hydrated.   Head:  normocephalic and atraumatic.   Eyes:  vision grossly intact, pupils equal, pupils round, and pupils reactive to light.  EOMI.  sclera anicteric Mouth:  pharynx pink and moist.   Lungs:  normal breath sounds, no crackles, and no wheezes.   Heart:  normal rate, regular rhythm, no murmur, and no gallop.   Msk:  normal ROM, no joint tenderness, no joint swelling, no joint warmth, no redness over joints, no joint deformities, no joint instability, and no crepitation.   Extremities:  No erythema, swelling,edema, warmth, or abnormal lesions present on exam. Neurologic:  Patient is alert & oriented X3.   Skin:  turgor normal, color normal, no rashes, and no suspicious lesions.   Psych:  normally interactive.  Oriented X3, good eye contact, not anxious appearing, and not depressed appearing.     Impression & Recommendations:  Problem # 1:  CELLULITIS, FOOT, RIGHT (ICD-682.7) Pts symptoms are fully resolved after a full course of doxycycline.  No indication for continued abx or further work up at this point.  Problem # 2:  ALLERGIC RHINITIS (ICD-477.9) Pt scheduled to have surgery with ENT tomorrow for tx of her chronic sinusitis.  Will obtain OR  reports and office visit records from ENT. Her updated medication list for this problem includes:    Claritin 10 Mg Tabs (Loratadine) .Marland Kitchen... Take 1 tablet by mouth once a day  Complete Medication List: 1)  Truvada 200-300 Mg Tabs (Emtricitabine-tenofovir) .... One tab by mouth once daily 2)  Lexiva 700 Mg Tabs (Fosamprenavir calcium) .... 2 tablets by mouth once daily 3)  Norvir 100 Mg Caps (Ritonavir) .... 2  tablets by mouth once daily 4)  Claritin 10 Mg Tabs (Loratadine) .... Take 1 tablet by mouth once a day 5)  K-vescent 20 Meq Pack (Potassium chloride) .... Take 2  tablets by mouth daily. 6)  Avapro 150 Mg Tabs (Irbesartan) .... Take 1 tablet by mouth once a day 7)  Hydrochlorothiazide 25 Mg Tabs (Hydrochlorothiazide) .Marland Kitchen.. 1 tab by mouth daily 8)  Singulair 10 Mg Tabs (Montelukast sodium) .... Take 1 tablet by mouth once a day 9)  Asmanex 120 Metered Doses 220 Mcg/inh Aepb (Mometasone furoate) .... Inhaled daily. 10)  Norvasc 5 Mg Tabs (Amlodipine besylate) .... Take 1/2 tablet by mouth once a day 11)  Ventolin Hfa 108 (90 Base) Mcg/act Aers (Albuterol sulfate) .... Inhale 2 puff every 6 hours as needed for sob and wheezing. 12)  Pravachol 40 Mg Tabs (Pravastatin sodium) .... Take 1 tablet by mouth once a day 13)  Nexium 40 Mg Cpdr (Esomeprazole magnesium) .... Take 1 capsule by mouth once a day  Patient Instructions: 1)  Please schedule a follow-up appointment in 1-2 months with Dr. Gwenlyn Perking. 2)  Good luck with your surgery tomorrow! 3)  [employer]; they have an online website at  [email or web address]  Prevention & Chronic Care Immunizations   Influenza vaccine: Fluvax Non-MCR  (01/27/2009)   Influenza vaccine deferral: Deferred  (10/21/2008)   Influenza vaccine due: 12/25/2010    Tetanus booster: Not documented   Td booster deferral: Deferred  (12/23/2009)   Tetanus booster due: 10/22/2018    Pneumococcal vaccine: Historical  (04/05/2004)   Pneumococcal vaccine deferral: Deferred  (12/23/2009)   Pneumococcal vaccine due: 04/05/2009  Other Screening   Pap smear: NEGATIVE FOR INTRAEPITHELIAL LESIONS OR MALIGNANCY.  (06/09/2008)   Pap smear action/deferral: Deferred  (12/23/2009)   Pap smear due: 06/09/2009    Mammogram: ASSESSMENT: Negative - BI-RADS 1^MM DIGITAL SCREENING  (06/10/2009)   Mammogram due: 06/10/2010   Smoking status: quit > 6 months   (01/04/2010)  Lipids   Total Cholesterol: 270  (08/26/2009)   Lipid panel action/deferral: Lipid Panel ordered   LDL: 186  (08/26/2009)   LDL Direct: Not documented   HDL: 63  (08/26/2009)   Triglycerides: 105  (08/26/2009)    SGOT (AST): 18  (10/05/2009)   SGPT (ALT): 23  (10/05/2009)   Alkaline phosphatase: 100  (10/05/2009)   Total bilirubin: 0.3  (10/05/2009)  Hypertension   Last Blood Pressure: 152 / 99  (01/04/2010)   Serum creatinine: 0.84  (12/23/2009)   BMP action: Ordered   Serum potassium 3.4  (12/23/2009)  Self-Management Support :   Personal Goals (by the next clinic visit) :      Personal blood pressure goal: 140/90  (10/21/2008)     Personal LDL goal: 130  (02/11/2009)    Patient will work on the following items until the next clinic visit to reach self-care goals:     Medications and monitoring: take my medicines every day, bring all of my medications to every visit  (01/04/2010)     Eating: drink diet soda or water instead of juice or soda, eat more vegetables, use fresh or frozen vegetables, eat foods that are low in salt, eat baked foods instead of fried foods, eat fruit for snacks and desserts, limit or avoid alcohol  (01/04/2010)     Activity: take a 30 minute walk every day, take the stairs instead of the elevator, park at the far end of the parking lot  (01/04/2010)    Hypertension self-management support: Written self-care plan, Education handout, Pre-printed educational material  (01/04/2010)   Hypertension self-care plan printed.   Hypertension education handout printed    Lipid self-management support: Written self-care plan, Education handout, Pre-printed educational material  (01/04/2010)   Lipid self-care plan printed.   Lipid education handout printed

## 2010-05-27 NOTE — Miscellaneous (Signed)
Summary: [facility]   Imported By: Margie Billet 10/06/2009 13:59:34  _____________________________________________________________________  External Attachment:    Type:   Image     Comment:   External Document

## 2010-05-27 NOTE — Progress Notes (Signed)
Summary: refill/gg  Phone Note Refill Request  on April 05, 2010 1:49 PM  Refills Requested: Medication #1:  PROTONIX 20 MG TBEC Take 1 tablet by mouth two times a day  Medication #2:  SINGULAIR 10 MG TABS Take 1 tablet by mouth once a day  Method Requested: Fax to Local Pharmacy Initial call taken by: Merrie Roof RN,  April 05, 2010 1:50 PM  Follow-up for Phone Call        Refill approved-nurse to complete    Prescriptions: PROTONIX 20 MG TBEC (PANTOPRAZOLE SODIUM) Take 1 tablet by mouth two times a day  #60 x 2   Entered and Authorized by:   Vassie Loll MD   Signed by:   Vassie Loll MD on 04/05/2010   Method used:   Telephoned to ...       [facility] (retail)       [address]       [place], [place]  [address]  [place]       Ph: [phone]       Fax: [phone]   RxID:   [id number] SINGULAIR 10 MG TABS (MONTELUKAST SODIUM) Take 1 tablet by mouth once a day  #31 x 5   Entered and Authorized by:   Vassie Loll MD   Signed by:   Vassie Loll MD on 04/05/2010   Method used:   Telephoned to ...       [facility] (retail)       [address]       [place], [place]  [address]  [place]       Ph: [phone]       Fax: [phone]   RxID:   [id number]

## 2010-05-27 NOTE — Progress Notes (Signed)
Summary: med refill/gp  Phone Note Refill Request Message from:  Fax from Pharmacy on February 03, 2010 3:31 PM  Refills Requested: Medication #1:  PRAVACHOL 40 MG TABS Take 1 tablet by mouth once a day Last appt. 9/12.   Lipids 5/4.   Method Requested: Telephone to Pharmacy Initial call taken by: Chinita Pester RN,  February 03, 2010 3:32 PM  Follow-up for Phone Call        Refill approved-nurse to complete    Prescriptions: PRAVACHOL 40 MG TABS (PRAVASTATIN SODIUM) Take 1 tablet by mouth once a day  #31 x 5   Entered and Authorized by:   Vassie Loll MD   Signed by:   Vassie Loll MD on 02/04/2010   Method used:   Telephoned to ...       [facility] (mail-order)       [address]       [place], [place]  [address]       Ph: [phone]       Fax: [phone]   RxID:   [id number]   Appended Document: med refill/gp Above Rx refill request form faxed to [facility]

## 2010-05-27 NOTE — Assessment & Plan Note (Signed)
Summary: fu resch from 05/02   Primary Provider:  Vassie Loll MD  CC:  follow-up visit.  History of Present Illness: 50 yo F with HIV+ CD4 460, VL <48 (02-23-09). Also hx of HTN,  smoking, and hyperlipidemia. feels great. Has been splitting up her anti-htn meds to try and make them more tolerable. still having high bp. has decreased her Na intake, has not lost any wt (trying). exercising 3-4 weeks/wk.  Cont back pain- no help from flexeril or tramadol. Cont hot flashes and night sweats. feels like she has a sinus infection- sinus d/c. no fevers, has headache all the time- typically on L.   Preventive Screening-Counseling & Management  Alcohol-Tobacco     Alcohol drinks/day: 0     Smoking Status: quit > 6 months     Smoking Cessation Counseling: yes     Packs/Day: 5 ciggs     Year Started: 1983     Year Quit: 2010     Passive Smoke Exposure: no  Caffeine-Diet-Exercise     Caffeine use/day: coffee occassionally     Does Patient Exercise: yes     Type of exercise: walking     Exercise (avg: min/session): <30     Times/week: 4  Safety-Violence-Falls     Seat Belt Use: yes   Updated Prior Medication List: TRUVADA 200-300 MG TABS (EMTRICITABINE-TENOFOVIR) one tab by mouth once daily LEXIVA 700 MG TABS (FOSAMPRENAVIR CALCIUM) 2 tablets by mouth once daily NORVIR 100 MG CAPS (RITONAVIR) 2 tablets by mouth once daily CLARITIN 10 MG  TABS (LORATADINE) Take 1 tablet by mouth once a day PROTONIX 40 MG TBEC (PANTOPRAZOLE SODIUM) Take 1 tablet by mouth two times a day K-VESCENT 20 MEQ PACK (POTASSIUM CHLORIDE) Take 2  tablets by mouth daily. AVAPRO 150 MG TABS (IRBESARTAN) Take 1 tablet by mouth once a day HYDROCHLOROTHIAZIDE 25 MG TABS (HYDROCHLOROTHIAZIDE) 1 tab by mouth daily SINGULAIR 10 MG TABS (MONTELUKAST SODIUM) Take 1 tablet by mouth once a day ASMANEX 120 METERED DOSES 220 MCG/INH AEPB (MOMETASONE FUROATE) inhaled daily. NORVASC 5 MG TABS (AMLODIPINE BESYLATE) Take 1/2  tablet by mouth once a day FLEXERIL 5 MG TABS (CYCLOBENZAPRINE HCL) Take 1 tab by mouth at bedtime VOLTAREN 1 % GEL (DICLOFENAC SODIUM) Apply in your tender area twice a day  Current Allergies (reviewed today): ! TYLENOL ! MORPHINE ! * DIAZIDE Past History:  Past medical, surgical, family and social histories (including risk factors) reviewed, and no changes noted (except as noted below).  Past Medical History: Reviewed history from 05/09/2006 and no changes required. HIV disease (1994) Hepatitis C Hypertension GERD Allergic rhinitis Depression  Family History: Reviewed history from 09/05/2007 and no changes required. Family History of CAD Female 1st degree relative <60 Family History Diabetes 1st degree relative- 3 siblings Family History of Stroke F 1st degree relative <60 Family History Hypertension  Social History: Reviewed history from 09/02/2008 and no changes required. Former Smoker. Alcohol use-no Drug use-no.  Review of Systems       apetite improved. thirsty  Vital Signs:  Patient profile:   50 year old female Menstrual status:  postmenopausal Height:      61 inches (154.94 cm) Weight:      157.0 pounds (71.36 kg) BMI:     29.77 Temp:     97.9 degrees F (36.61 degrees C) oral Pulse rate:   85 / minute BP sitting:   155 / 105  (right arm)  Vitals Entered By: Baxter Hire) (Aug 26, 2009 9:03 AM) CC: follow-up visit Pain Assessment Patient in pain? yes     Location: lower back Intensity: 6 Type: aching Onset of pain  Constant Nutritional Status BMI of 25 - 29 = overweight Nutritional Status Detail appetite is good per patient  Have you ever been in a relationship where you felt threatened, hurt or afraid?No   Does patient need assistance? Functional Status Self care Ambulation Normal   Physical Exam  General:  well-developed, well-nourished, well-hydrated, and overweight-appearing.   Eyes:  pupils equal, pupils round, and pupils  reactive to light.   Mouth:  pharynx pink and moist, no exudates, and no postnasal drip.   Neck:  no masses.   Lungs:  normal respiratory effort and normal breath sounds.   Heart:  normal rate, regular rhythm, and no murmur.   Abdomen:  soft, non-tender, and normal bowel sounds.   Extremities:  no edema        Medication Adherence: 08/26/2009   Adherence to medications reviewed with patient. Counseling to provide adequate adherence provided   Prevention For Positives: 08/26/2009   Safe sex practices discussed with patient. Condoms offered.                             Impression & Recommendations:  Problem # 1:  HIV DISEASE (ICD-042) she appears to be doing well. given condoms. will check her labs today. will have her eval by GYN for her hot flashes.   Problem # 2:  HYPERTENSION, ESSENTIAL NOS (ICD-401.9) she will f/u with Dr Gwenlyn Perking. appt made for her today.  Her updated medication list for this problem includes:    Avapro 150 Mg Tabs (Irbesartan) .Marland Kitchen... Take 1 tablet by mouth once a day    Hydrochlorothiazide 25 Mg Tabs (Hydrochlorothiazide) .Marland Kitchen... 1 tab by mouth daily    Norvasc 5 Mg Tabs (Amlodipine besylate) .Marland Kitchen... Take 1/2 tablet by mouth once a day  Problem # 3:  RHINITIS, ALLERGIC NOS (ICD-477.9) suspect she has allergies and not sinus infection. she has ENT f/u on monday (08-31-09) Her updated medication list for this problem includes:    Claritin 10 Mg Tabs (Loratadine) .Marland Kitchen... Take 1 tablet by mouth once a day  Other Orders: Est. Patient Level IV ([address]) T-CD4SP ([facility]) (CD4SP) T-HIV Viral Load [phone]) T-Comprehensive Metabolic Panel [phone]) T-Lipid Profile [id number]) T-CBC w/Diff [id number]) T-RPR (Syphilis) ([id number])  Process Orders Check Orders Results:     [facility]: ABN not required for this insurance Tests Sent for requisitioning (Aug 26, 2009 9:35 AM):     08/26/2009: [facility] -- T-HIV Viral  Load [phone] (signed)     08/26/2009: [facility] -- T-Comprehensive Metabolic Panel [[id number]] (signed)     08/26/2009: [facility] -- T-Lipid Profile [id number] (signed)     08/26/2009: [facility] -- T-CBC w/Diff [[id number]] (signed)     08/26/2009: [facility] -- T-RPR (Syphilis) [phone] (signed)

## 2010-05-27 NOTE — Assessment & Plan Note (Signed)
Summary: ACUTE/MADERA/3 WEEK CHECK PER Y[facility]   Vital Signs:  Patient profile:   50 year old female Menstrual status:  postmenopausal Height:      61 inches (154.94 cm) Weight:      154.6 pounds (70.27 kg) BMI:     29.32 Temp:     97.0 degrees F (36.11 degrees C) oral Pulse rate:   100 / minute BP sitting:   149 / 99  (right arm) Cuff size:   regular  Vitals Entered By: Theotis Barrio NT II (March 29, 2010 10:50 AM) CC: FOLLOW UP BP/ MEDICATION CHANGE /  MEDICATION REFILL /  Is Patient Diabetic? No Pain Assessment Patient in pain? no      Nutritional Status BMI of 25 - 29 = overweight  Have you ever been in a relationship where you felt threatened, hurt or afraid?No   Does patient need assistance? Functional Status Self care Ambulation Normal   Primary Care Provider:  Vassie Loll MD  CC:  FOLLOW UP BP/ MEDICATION CHANGE /  MEDICATION REFILL / .  History of Present Illness: [name] [name] is a 50 year old woman with pmh significant for HIV, OA, HLD, HTN, Depression, and insomnia, who presents to the clinic for follow up after her Cozaar was stopped. She went to the ED in early November for chest pain and dizziness. Chest pain was noncardiac in nature, attributed to anxiety and GERD. Patient was instructed to stop taking cozaar. She states that ever since she stopped taking Cozaar she has been feeling better. No chest pain, sob, dizziness, cough, nausea or vomiting. She also denies abdominal pain, changes in bowel or urinary habits.        Preventive Screening-Counseling & Management  Alcohol-Tobacco     Alcohol drinks/day: 0     Smoking Status: quit > 6 months     Smoking Cessation Counseling: yes     Packs/Day: 5 ciggs     Year Started: 1983     Year Quit: 2010     Passive Smoke Exposure: no  Caffeine-Diet-Exercise     Caffeine use/day: coffee occassionally     Does Patient Exercise: yes     Type of exercise: walking     Exercise (avg: min/session): <30   Times/week: 4  Current Medications (verified): 1)  Truvada 200-300 Mg Tabs (Emtricitabine-Tenofovir) .... One Tab By Mouth Once Daily 2)  Lexiva 700 Mg Tabs (Fosamprenavir Calcium) .... 2 Tablets By Mouth Once Daily 3)  Norvir 100 Mg Caps (Ritonavir) .... 2 Tablets By Mouth Once Daily 4)  Claritin 10 Mg  Tabs (Loratadine) .... Take 1 Tablet By Mouth Once A Day 5)  K-Vescent 20 Meq Pack (Potassium Chloride) .... Take 2  Tablets By Mouth Daily. 6)  Hydrochlorothiazide 25 Mg Tabs (Hydrochlorothiazide) .Marland Kitchen.. 1 Tab By Mouth Daily 7)  Singulair 10 Mg Tabs (Montelukast Sodium) .... Take 1 Tablet By Mouth Once A Day 8)  Norvasc 5 Mg Tabs (Amlodipine Besylate) .... Take 1 Tablet By Mouth Once A Day 9)  Pravachol 40 Mg Tabs (Pravastatin Sodium) .... Take 1 Tablet By Mouth Once A Day 10)  Protonix 20 Mg Tbec (Pantoprazole Sodium) .... Take 1 Tablet By Mouth Two Times A Day 11)  Vicodin 5-500 Mg Tabs (Hydrocodone-Acetaminophen) .... Take 1 Tab Every 8 Hours As Needed For Pain. 12)  Naprosyn 500 Mg Tabs (Naproxen) .... Take 1 Tablet By Mouth Two Times A Day W/  Food As Needed For Back Pain  Allergies (verified): 1)  !  Tylenol 2)  ! Morphine 3)  ! * Diazide  Past History:  Past Medical History: Last updated: 05/09/2006 HIV disease (1994) Hepatitis C Hypertension GERD Allergic rhinitis Depression  Family History: Last updated: 08/26/2009 Family History of CAD Female 1st degree relative <60 Family History Diabetes 1st degree relative- 3 siblings Family History of Stroke F 1st degree relative <60 Family History Hypertension  Social History: Last updated: 03/29/2010 Former Smoker. Quit 2009, smoked 1/2 PPD Alcohol use-no Drug use-no. Disabled Single No children   Risk Factors: Alcohol Use: 0 (03/29/2010) Caffeine Use: coffee occassionally (03/29/2010) Exercise: yes (03/29/2010)  Risk Factors: Smoking Status: quit > 6 months (03/29/2010) Packs/Day: 5 ciggs (03/29/2010) Passive  Smoke Exposure: no (03/29/2010)  Social History: Former Smoker. Quit 2009, smoked 1/2 PPD Alcohol use-no Drug use-no. Disabled Single No children   Review of Systems      See HPI  Physical Exam  General:  alert and well-developed.     Impression & Recommendations:  Problem # 1:  HYPERTENSION (ICD-401.9) Assessment Unchanged Cozaar was stopped in setting of dizziness and chest pain. Pt states she has been feeling better since cozaar has been stopped. BP still elevated. Plan to increase Norvasc and have pt follow up in 2 weeks.  Will check renal function and electrolytes today  Her updated medication list for this problem includes:    Hydrochlorothiazide 25 Mg Tabs (Hydrochlorothiazide) .Marland Kitchen... 1 tab by mouth daily    Norvasc 10 Mg Tabs (Amlodipine besylate) .Marland Kitchen... Take 1 tablet by mouth once a day  Problem # 2:  HYPERLIPIDEMIA (ICD-272.4) Patient was started on statin in May for elevated cholesterol. Will check lipid profile today if dose needs to be adjusted, along with LFTs.   Her updated medication list for this problem includes:    Pravachol 40 Mg Tabs (Pravastatin sodium) .Marland Kitchen... Take 1 tablet by mouth once a day  Orders: T-Lipid Profile [id number]) T-Hepatic Function [id number])  Labs Reviewed: SGOT: 18 (10/05/2009)   SGPT: 23 (10/05/2009)   HDL:63 (08/26/2009), 50 (05/22/2009)  LDL:186 (08/26/2009), 152 (05/22/2009)  Chol:270 (08/26/2009), 232 (05/22/2009)  Trig:105 (08/26/2009), 149 (05/22/2009)  Complete Medication List: 1)  Truvada 200-300 Mg Tabs (Emtricitabine-tenofovir) .... One tab by mouth once daily 2)  Lexiva 700 Mg Tabs (Fosamprenavir calcium) .... 2 tablets by mouth once daily 3)  Norvir 100 Mg Caps (Ritonavir) .... 2 tablets by mouth once daily 4)  Claritin 10 Mg Tabs (Loratadine) .... Take 1 tablet by mouth once a day 5)  K-vescent 20 Meq Pack (Potassium chloride) .... Take 2  tablets by mouth daily. 6)  Hydrochlorothiazide 25 Mg Tabs  (Hydrochlorothiazide) .Marland Kitchen.. 1 tab by mouth daily 7)  Singulair 10 Mg Tabs (Montelukast sodium) .... Take 1 tablet by mouth once a day 8)  Norvasc 10 Mg Tabs (Amlodipine besylate) .... Take 1 tablet by mouth once a day 9)  Pravachol 40 Mg Tabs (Pravastatin sodium) .... Take 1 tablet by mouth once a day 10)  Protonix 20 Mg Tbec (Pantoprazole sodium) .... Take 1 tablet by mouth two times a day 11)  Vicodin 5-500 Mg Tabs (Hydrocodone-acetaminophen) .... Take 1 tab every 8 hours as needed for pain. 12)  Naprosyn 500 Mg Tabs (Naproxen) .... Take 1 tablet by mouth two times a day w/  food as needed for back pain  Other Orders: T-Basic Metabolic Panel [id number])  Patient Instructions: 1)  Please start taking Amlodipine 10 mg by mouth once daily  2)  In the meantime, please take 2 of the  Norvasc 5 mg tablets once a day, along with the hydrochlorothiazide.  3)  Please follow up in 2 weeks for blood pressure recheck.  Prescriptions: NORVASC 10 MG TABS (AMLODIPINE BESYLATE) Take 1 tablet by mouth once a day  #30 x 0   Entered and Authorized by:   Melida Quitter MD   Signed by:   Melida Quitter MD on 03/29/2010   Method used:   Faxed to ...       [facility] (retail)       [address]       [place], [place]  [address]  [place]       Ph: [phone]       Fax: [phone]   RxID:   [id number]    Orders Added: 1)  T-Lipid Profile [id number] 2)  T-Basic Metabolic Panel [id number] 3)  T-Hepatic Function [[id number]] 4)  Est. Patient Level III [25956]   Process Orders Check Orders Results:     [facility]: ABN not required for this insurance Tests Sent for requisitioning (March 29, 2010 11:35 AM):     03/29/2010: [facility] -- T-Lipid Profile [phone] (signed)     03/29/2010: [facility] -- T-Basic Metabolic Panel [phone] (signed)     03/29/2010: [facility] -- T-Hepatic  Function [phone] (signed)     Prevention & Chronic Care Immunizations   Influenza vaccine: Fluvax MCR  (01/27/2010)   Influenza vaccine deferral: Deferred  (10/21/2008)   Influenza vaccine due: 12/25/2010    Tetanus booster: Not documented   Td booster deferral: Deferred  (12/23/2009)   Tetanus booster due: 10/22/2018    Pneumococcal vaccine: Pneumovax  (03/02/2010)   Pneumococcal vaccine deferral: Deferred  (12/23/2009)   Pneumococcal vaccine due: 04/05/2009  Other Screening   Pap smear: No malignancy.  (01/28/2010)   Pap smear action/deferral: Deferred  (12/23/2009)   Pap smear due: 06/09/2009    Mammogram: ASSESSMENT: Negative - BI-RADS 1^MM DIGITAL SCREENING  (06/10/2009)   Mammogram due: 06/10/2010   Smoking status: quit > 6 months  (03/29/2010)  Lipids   Total Cholesterol: 270  (08/26/2009)   Lipid panel action/deferral: Lipid Panel ordered   LDL: 186  (08/26/2009)   LDL Direct: Not documented   HDL: 63  (08/26/2009)   Triglycerides: 105  (08/26/2009)    SGOT (AST): 18  (10/05/2009)   SGPT (ALT): 23  (10/05/2009)   Alkaline phosphatase: 100  (10/05/2009)   Total bilirubin: 0.3  (10/05/2009)    Lipid flowsheet reviewed?: Yes   Progress toward LDL goal: Deteriorated  Hypertension   Last Blood Pressure: 149 / 99  (03/29/2010)   Serum creatinine: 0.84  (12/23/2009)   BMP action: Ordered   Serum potassium 3.4  (12/23/2009)    Hypertension flowsheet reviewed?: Yes   Progress toward BP goal: Unchanged  Self-Management Support :   Personal Goals (by the next clinic visit) :      Personal blood pressure goal: 140/90  (10/21/2008)     Personal LDL goal: 130  (02/11/2009)    Patient will work on the following items until the next clinic visit to reach self-care goals:     Medications and monitoring: take my medicines every day  (03/29/2010)     Eating: drink diet soda or water instead of juice or soda, eat more vegetables, use fresh or frozen  vegetables, eat foods that are low in salt, eat baked foods instead of fried foods, eat fruit for snacks and desserts, limit or  avoid alcohol  (03/29/2010)     Activity: take a 30 minute walk every day  (03/29/2010)    Hypertension self-management support: Resources for patients handout  (03/29/2010)    Lipid self-management support: Resources for patients handout  (03/29/2010)        Resource handout printed.   Appended Document: ACUTE/MADERA/3 WEEK CHECK PER YANG/[facility] Physical Exam  Gen: NAD, VS reviewed and BP elevated Lungs: Clear Heart: RRR, no m/g/r Abdomen: soft, NT, ND, BS+ Extremities: no edema  Neuro: nonfocal   Appended Document: ACUTE/MADERA/3 WEEK CHECK PER YANG/[facility]  Laboratory Results   Urine Tests  Date/Time Received: 12-05-011      10:00am Date/Time Reported: 12-05-011    10:06am  Routine Urinalysis   Color: yellow Appearance: Hazy Glucose: negative   (Normal Range: Negative) Bilirubin: negative   (Normal Range: Negative) Ketone: negative   (Normal Range: Negative) Spec. Gravity: 1.020   (Normal Range: 1.003-1.035) Blood: negative   (Normal Range: Negative) pH: 5.0   (Normal Range: 5.0-8.0) Protein: negative   (Normal Range: Negative) Urobilinogen: 0.2   (Normal Range: 0-1) Nitrite: negative   (Normal Range: Negative)

## 2010-05-27 NOTE — Assessment & Plan Note (Signed)
Summary: ER/FU/SB.   Vital Signs:  Patient profile:   50 year old female Menstrual status:  postmenopausal Height:      61 inches (154.94 cm) Weight:      158.02 pounds (71.83 kg) BMI:     29.97 Temp:     97.5 degrees F (36.39 degrees C) oral Pulse rate:   94 / minute BP sitting:   135 / 90  (right arm) Cuff size:   regular  Vitals Entered By: Angelina Ok RN (March 08, 2010 10:36 AM) CC: Depression Is Patient Diabetic? No Pain Assessment Patient in pain? yes     Location: chest Intensity: 7 Type: sharp/aching Onset of pain  Constant Nutritional Status BMI of 25 - 29 = overweight  Have you ever been in a relationship where you felt threatened, hurt or afraid?No  Comments ER follow up.  Got dizzy and having chest pain.  Work up and labs done.  All were negative.  Holding her Cozzaar until her appointment.  Dizziness has stopped. No shortness of breath.    Primary Care Provider:  Vassie Loll MD  CC:  Depression.  History of Present Illness: Pt is a 50 yo AAF with PMH of HIV, HTN and HLD who came here here for f/u her dizziness adn CP. This has been for one week and had worked up in ED which was [facility]. Her Cozaar was stopped then due to soft BP. Since then her symptoms has been much better, dizzines resolved and CP also better, dull, no radiation, no N/V or diaphoresis.She has chronic CP and worse with anxiety. She has no other c/o, including fever, cough, or melena. Quitted smoking about 4 years.   Depression History:      The patient denies a depressed mood most of the day and a diminished interest in her usual daily activities.         Preventive Screening-Counseling & Management  Alcohol-Tobacco     Alcohol drinks/day: 0     Smoking Status: quit > 6 months     Smoking Cessation Counseling: yes     Packs/Day: 5 ciggs     Year Started: 1983     Year Quit: 2010     Passive Smoke Exposure: no  Pap Smear  Procedure date:  01/28/2010  Findings:      No  malignancy.  Problems Prior to Update: 1)  Cellulitis, Foot, Right  (ICD-682.7) 2)  Pain in Joint, Ankle and Foot  (ICD-719.47) 3)  Symptomatic Menopausal/female Climacteric States  (ICD-627.2) 4)  Back Pain  (ICD-724.5) 5)  Vaginal Discharge  (ICD-623.5) 6)  Hyperlipidemia  (ICD-272.4) 7)  Screening For Malignant Neoplasm of The Cervix  (ICD-V76.2) 8)  Candidiasis, Vaginal  (ICD-112.1) 9)  Hypokalemia  (ICD-276.8) 10)  Family History Diabetes 1st Degree Relative  (ICD-V18.0) 11)  Family History of Cad Female 1st Degree Relative <60  (ICD-V16.49) 12)  Upper Respiratory Infection  (ICD-465.9) 13)  Bronchitis  (ICD-490) 14)  Insomnia  (ICD-780.52) 15)  Tobacco Abuse  (ICD-305.1) 16)  Depression  (ICD-311) 17)  Allergic Rhinitis  (ICD-477.9) 18)  Hypertension  (ICD-401.9) 19)  Disorder, Depressive Nec  (ICD-311) 20)  Hepatitis C  (ICD-070.51) 21)  Rhinitis, Allergic Nos  (ICD-477.9) 22)  Gerd  (ICD-530.81) 23)  Hypertension, Essential Nos  (ICD-401.9) 24)  HIV Disease  (ICD-042)  Medications Prior to Update: 1)  Truvada 200-300 Mg Tabs (Emtricitabine-Tenofovir) .... One Tab By Mouth Once Daily 2)  Lexiva 700 Mg Tabs (Fosamprenavir Calcium) .... 2  Tablets By Mouth Once Daily 3)  Norvir 100 Mg Caps (Ritonavir) .... 2 Tablets By Mouth Once Daily 4)  Claritin 10 Mg  Tabs (Loratadine) .... Take 1 Tablet By Mouth Once A Day 5)  K-Vescent 20 Meq Pack (Potassium Chloride) .... Take 2  Tablets By Mouth Daily. 6)  Cozaar 50 Mg Tabs (Losartan Potassium) .... Take 1 Tablet By Mouth Once A Day 7)  Hydrochlorothiazide 25 Mg Tabs (Hydrochlorothiazide) .Marland Kitchen.. 1 Tab By Mouth Daily 8)  Singulair 10 Mg Tabs (Montelukast Sodium) .... Take 1 Tablet By Mouth Once A Day 9)  Asmanex 120 Metered Doses 220 Mcg/inh Aepb (Mometasone Furoate) .... Inhaled Daily. 10)  Norvasc 5 Mg Tabs (Amlodipine Besylate) .... Take 1/2 Tablet By Mouth Once A Day 11)  Ventolin Hfa 108 (90 Base) Mcg/act Aers (Albuterol  Sulfate) .... Inhale 2 Puff Every 6 Hours As Needed For Sob and Wheezing. 12)  Pravachol 40 Mg Tabs (Pravastatin Sodium) .... Take 1 Tablet By Mouth Once A Day 13)  Protonix 20 Mg Tbec (Pantoprazole Sodium) .... Take 1 Tablet By Mouth Two Times A Day 14)  Vicodin 5-500 Mg Tabs (Hydrocodone-Acetaminophen) .... Take 1 Tab Every 8 Hours As Needed For Pain. 15)  Naprosyn 500 Mg Tabs (Naproxen) .... Take 1 Tablet By Mouth Two Times A Day W/  Food As Needed For Back Pain  Current Medications (verified): 1)  Truvada 200-300 Mg Tabs (Emtricitabine-Tenofovir) .... One Tab By Mouth Once Daily 2)  Lexiva 700 Mg Tabs (Fosamprenavir Calcium) .... 2 Tablets By Mouth Once Daily 3)  Norvir 100 Mg Caps (Ritonavir) .... 2 Tablets By Mouth Once Daily 4)  Claritin 10 Mg  Tabs (Loratadine) .... Take 1 Tablet By Mouth Once A Day 5)  K-Vescent 20 Meq Pack (Potassium Chloride) .... Take 2  Tablets By Mouth Daily. 6)  Hydrochlorothiazide 25 Mg Tabs (Hydrochlorothiazide) .Marland Kitchen.. 1 Tab By Mouth Daily 7)  Singulair 10 Mg Tabs (Montelukast Sodium) .... Take 1 Tablet By Mouth Once A Day 8)  Asmanex 120 Metered Doses 220 Mcg/inh Aepb (Mometasone Furoate) .... Inhaled Daily. 9)  Norvasc 5 Mg Tabs (Amlodipine Besylate) .... Take 1/2 Tablet By Mouth Once A Day 10)  Ventolin Hfa 108 (90 Base) Mcg/act Aers (Albuterol Sulfate) .... Inhale 2 Puff Every 6 Hours As Needed For Sob and Wheezing. 11)  Pravachol 40 Mg Tabs (Pravastatin Sodium) .... Take 1 Tablet By Mouth Once A Day 12)  Protonix 20 Mg Tbec (Pantoprazole Sodium) .... Take 1 Tablet By Mouth Two Times A Day 13)  Vicodin 5-500 Mg Tabs (Hydrocodone-Acetaminophen) .... Take 1 Tab Every 8 Hours As Needed For Pain. 14)  Naprosyn 500 Mg Tabs (Naproxen) .... Take 1 Tablet By Mouth Two Times A Day W/  Food As Needed For Back Pain  Allergies (verified): 1)  ! Tylenol 2)  ! Morphine 3)  ! * Diazide  Past History:  Past Medical History: Last updated: 05/09/2006 HIV  disease (1994) Hepatitis C Hypertension GERD Allergic rhinitis Depression  Family History: Last updated: 08/26/2009 Family History of CAD Female 1st degree relative <60 Family History Diabetes 1st degree relative- 3 siblings Family History of Stroke F 1st degree relative <60 Family History Hypertension  Risk Factors: Alcohol Use: 0 (03/08/2010) Caffeine Use: coffee occassionally (03/02/2010) Exercise: yes (03/02/2010)  Risk Factors: Smoking Status: quit > 6 months (03/08/2010) Packs/Day: 5 ciggs (03/08/2010) Passive Smoke Exposure: no (03/08/2010)  Family History: Reviewed history from 08/26/2009 and no changes required. Family History of CAD Female  1st degree relative <60 Family History Diabetes 1st degree relative- 3 siblings Family History of Stroke F 1st degree relative <60 Family History Hypertension  Social History: Reviewed history from 09/02/2008 and no changes required. Former Smoker. Alcohol use-no Drug use-no.  Review of Systems       The patient complains of chest pain.  The patient denies anorexia, fever, hoarseness, syncope, dyspnea on exertion, peripheral edema, prolonged cough, headaches, abdominal pain, and melena.    Physical Exam  General:  alert, well-developed, well-nourished, well-hydrated, and overweight-appearing.   Nose:  no nasal discharge.   Mouth:  pharynx pink and moist.   Neck:  supple.   Lungs:  normal respiratory effort, normal breath sounds, no crackles, and no wheezes.   Heart:  normal rate, regular rhythm, no murmur, no gallop, and no JVD.   Abdomen:  soft, non-tender, normal bowel sounds, and no distention.   Msk:  normal ROM, no joint tenderness, no joint swelling, no joint warmth, and no redness over joints.   Pulses:  2+ Extremities:  No edema.  Neurologic:  alert & oriented X3 and gait normal.     Impression & Recommendations:  Problem # 1:  CHEST PAIN, ATYPICAL (ICD-786.59) Assessment Improved Her symptom is mech  better after d/c cozzar. She has atypical CP before and related with anxiety recent unremakable work up in ED. So her CP is likely non-cardiac, may be due to anxiety, GERD or cozaar side effects. Will continue to monitor and hold her cozaar for now and recheck at next visit.   Problem # 2:  HYPERTENSION (ICD-401.9) Assessment: Deteriorated BP stable and will hold her cozaar for causing her dizziness and possible worsening CP. Will recheck BP at next visit and may increase norvasc if needed.  The following medications were removed from the medication list:    Cozaar 50 Mg Tabs (Losartan potassium) .Marland Kitchen... Take 1 tablet by mouth once a day Her updated medication list for this problem includes:    Hydrochlorothiazide 25 Mg Tabs (Hydrochlorothiazide) .Marland Kitchen... 1 tab by mouth daily    Norvasc 5 Mg Tabs (Amlodipine besylate) .Marland Kitchen... Take 1/2 tablet by mouth once a day  BP today: 135/90 Prior BP: 128/83 (03/02/2010)  Labs Reviewed: K+: 3.4 (12/23/2009) Creat: : 0.84 (12/23/2009)   Chol: 270 (08/26/2009)   HDL: 63 (08/26/2009)   LDL: 186 (08/26/2009)   TG: 105 (08/26/2009)  Problem # 3:  GERD (ICD-530.81) Assessment: Improved  Acid reflux is better and will continue PPI.  Her updated medication list for this problem includes:    Protonix 20 Mg Tbec (Pantoprazole sodium) .Marland Kitchen... Take 1 tablet by mouth two times a day  Labs Reviewed: Hgb: 13.7 (12/23/2009)   Hct: 40.5 (12/23/2009)  Problem # 4:  HIV DISEASE (ICD-042) Assessment: Unchanged Will continue her current ART regimen and f/u by Dr. Ninetta Lights.   Complete Medication List: 1)  Truvada 200-300 Mg Tabs (Emtricitabine-tenofovir) .... One tab by mouth once daily 2)  Lexiva 700 Mg Tabs (Fosamprenavir calcium) .... 2 tablets by mouth once daily 3)  Norvir 100 Mg Caps (Ritonavir) .... 2 tablets by mouth once daily 4)  Claritin 10 Mg Tabs (Loratadine) .... Take 1 tablet by mouth once a day 5)  K-vescent 20 Meq Pack (Potassium chloride) .... Take 2   tablets by mouth daily. 6)  Hydrochlorothiazide 25 Mg Tabs (Hydrochlorothiazide) .Marland Kitchen.. 1 tab by mouth daily 7)  Singulair 10 Mg Tabs (Montelukast sodium) .... Take 1 tablet by mouth once a day 8)  Asmanex  120 Metered Doses 220 Mcg/inh Aepb (Mometasone furoate) .... Inhaled daily. 9)  Norvasc 5 Mg Tabs (Amlodipine besylate) .... Take 1/2 tablet by mouth once a day 10)  Ventolin Hfa 108 (90 Base) Mcg/act Aers (Albuterol sulfate) .... Inhale 2 puff every 6 hours as needed for sob and wheezing. 11)  Pravachol 40 Mg Tabs (Pravastatin sodium) .... Take 1 tablet by mouth once a day 12)  Protonix 20 Mg Tbec (Pantoprazole sodium) .... Take 1 tablet by mouth two times a day 13)  Vicodin 5-500 Mg Tabs (Hydrocodone-acetaminophen) .... Take 1 tab every 8 hours as needed for pain. 14)  Naprosyn 500 Mg Tabs (Naproxen) .... Take 1 tablet by mouth two times a day w/  food as needed for back pain  Patient Instructions: 1)  Please schedule a follow-up appointment in 3-4 weeks. 2)  Please come to Clinic or ED if your symptoms of chest pain or dizziness get worse.  3)  It is important that you exercise regularly at least 20 minutes 5 times a week. If you develop chest pain, have severe difficulty breathing, or feel very tired , stop exercising immediately and seek medical attention. 4)  You need to lose weight. Consider a lower calorie diet and regular exercise.    Orders Added: 1)  Est. Patient Level IV [04540]    Prevention & Chronic Care Immunizations   Influenza vaccine: Fluvax MCR  (01/27/2010)   Influenza vaccine deferral: Deferred  (10/21/2008)   Influenza vaccine due: 12/25/2010    Tetanus booster: Not documented   Td booster deferral: Deferred  (12/23/2009)   Tetanus booster due: 10/22/2018    Pneumococcal vaccine: Pneumovax  (03/02/2010)   Pneumococcal vaccine deferral: Deferred  (12/23/2009)   Pneumococcal vaccine due: 04/05/2009  Other Screening   Pap smear: No malignancy.   (01/28/2010)   Pap smear action/deferral: Deferred  (12/23/2009)   Pap smear due: 06/09/2009    Mammogram: ASSESSMENT: Negative - BI-RADS 1^MM DIGITAL SCREENING  (06/10/2009)   Mammogram due: 06/10/2010   Smoking status: quit > 6 months  (03/08/2010)  Lipids   Total Cholesterol: 270  (08/26/2009)   Lipid panel action/deferral: Lipid Panel ordered   LDL: 186  (08/26/2009)   LDL Direct: Not documented   HDL: 63  (08/26/2009)   Triglycerides: 105  (08/26/2009)    SGOT (AST): 18  (10/05/2009)   SGPT (ALT): 23  (10/05/2009)   Alkaline phosphatase: 100  (10/05/2009)   Total bilirubin: 0.3  (10/05/2009)    Lipid flowsheet reviewed?: Yes   Progress toward LDL goal: Deteriorated  Hypertension   Last Blood Pressure: 135 / 90  (03/08/2010)   Serum creatinine: 0.84  (12/23/2009)   BMP action: Ordered   Serum potassium 3.4  (12/23/2009)    Hypertension flowsheet reviewed?: Yes   Progress toward BP goal: At goal  Self-Management Support :   Personal Goals (by the next clinic visit) :      Personal blood pressure goal: 140/90  (10/21/2008)     Personal LDL goal: 130  (02/11/2009)    Patient will work on the following items until the next clinic visit to reach self-care goals:     Medications and monitoring: take my medicines every day, bring all of my medications to every visit  (03/08/2010)     Eating: drink diet soda or water instead of juice or soda, eat more vegetables, use fresh or frozen vegetables, eat foods that are low in salt, eat baked foods instead of fried foods, eat  fruit for snacks and desserts, limit or avoid alcohol  (03/08/2010)     Activity: take a 30 minute walk every day  (03/08/2010)    Hypertension self-management support: Written self-care plan, Education handout, Pre-printed educational material, Resources for patients handout  (03/08/2010)   Hypertension self-care plan printed.   Hypertension education handout printed    Lipid self-management support:  Written self-care plan, Education handout, Pre-printed educational material, Resources for patients handout  (03/08/2010)   Lipid self-care plan printed.   Lipid education handout printed      Resource handout printed.

## 2010-05-27 NOTE — Progress Notes (Signed)
Summary: cough/congestion  Phone Note Call from Patient   Caller: Patient Summary of Call: Patient complains of non productive cough and congestion x 3days. No fever. Patient has tried otc meds without relief, wants a cough medicine called in to [facility] pharmacy. Initial call taken by: Starleen Arms CMA,  March 15, 2010 9:46 AM  Follow-up for Phone Call        pt. seen by Dr. Philipp Deputy for cough today and given RX Follow-up by: Wendall Mola CMA Duncan Dull),  March 16, 2010 4:23 PM  Additional Follow-up for Phone Call Additional follow up Details #1::        Thanks for the info; let's see how she feels with Dr. Danella Deis prescription.

## 2010-05-27 NOTE — Progress Notes (Signed)
Summary: Medication  Phone Note Refill Request Message from:  Fax from Pharmacy on March 30, 2010 11:12 AM  Refills Requested: Medication #1:  NORVASC 10 MG TABS Take 1 tablet by mouth once a day Call fom pt said that when she takes the Norvasc 10 mg along with her other HCTZ it makes her feel dixzzy.  Wants to know if she can split this  and only take 1/2 at a time.  Said that she feels better after sitting for a few minutes.    Method Requested: Electronic Initial call taken by: Angelina Ok RN,  March 30, 2010 11:15 AM  Follow-up for Phone Call        Yes she can do that, so take 1 and 1/2 tablets, as opposed to 2, and maybe she should split up the times, so take HCTZ in the morning and Norvasc at night. Follow-up by: Melida Quitter MD,  March 30, 2010 2:05 PM  Additional Follow-up for Phone Call Additional follow up Details #1::        RTC to pt given the option of taking 1 1/2 tablets of the Norvasc.  If that bothers her she will do the HCTZ in the am and the Norvasc in the pm.  Will call if problems continue. Additional Follow-up by: Angelina Ok RN,  April 01, 2010 3:57 PM    Additional Follow-up for Phone Call Additional follow up Details #2::    Thank you. Follow-up by: Melida Quitter MD,  April 01, 2010 3:59 PM

## 2010-05-27 NOTE — Progress Notes (Signed)
Summary: chest pain/ hla  Phone Note Call from Patient   Summary of Call: pt called from an exam room at dr hatcher's office c/o chest pain and shortness of breath, she states the nurse in dr hatcher's office told her to call int med and ask to be seen in clinic. pt is instructed that she needs to be seen in ED for chest pain, to tell dr hatcher when he comes in and then go to ED . pt appears here after lunch and is referred to ed, she is escorted to ED. Initial call taken by: Marin Roberts RN,  March 05, 2010 10:57 AM

## 2010-05-27 NOTE — Progress Notes (Signed)
Summary: phone/gg  Phone Note Call from Patient   Caller: Patient Summary of Call: Pt called and states she was seen in ED for dizziness and chest pain.  She states the ED states she was okay and stop the cozaar. She takes norvasc and hctz. Pt has a f/u with [place] on Monday. Feels better today. She was calling to be sure this was okay.   Pt told to keep appointment on Monday in clinic.  If any problems before  then to call us. Initial call taken by: Merrie Roof RN,  March 03, 2010 11:10 AM  Follow-up for Phone Call        ok will follow up monday Follow-up by: Julaine Fusi  DO,  March 03, 2010 1:00 PM  Additional Follow-up for Phone Call Additional follow up Details #1::        Pt informed Patient/caller verbalizes understanding of these instructions.  Additional Follow-up by: Merrie Roof RN,  March 03, 2010 4:44 PM

## 2010-05-27 NOTE — Consult Note (Signed)
Summary: [facility] [place]  [facility] [place]   Imported By: Florinda Marker 10/09/2009 10:25:28  _____________________________________________________________________  External Attachment:    Type:   Image     Comment:   External Document

## 2010-05-27 NOTE — Letter (Signed)
Summary: Generic Letter  [facility]  [address]   [place], [place] [address]   Phone: [phone]  Fax: [phone]       05/12/2010   RE:  Shritha Bresee    June 27, 2060   To Whom It May Concern:   [phone]  fax)  Ms. Rocchio is a patient of the [facility].  Her doctor, Dr. Denton Meek has recently ordered physical therapy which is a covered service under Medicaid.   As such, please assist Ms. Stonerock with bus passes so she can get to her physical therapy appointments at [facility].   If you have any questions, please do not hestitate to call me at [phone].   Sincerely,    Dorothe Pea, LCSW Clinical Social Worker

## 2010-05-27 NOTE — Letter (Signed)
Summary: [facility]  [address]   [place], [place] [address]   Phone: [phone]  Fax: [phone]           04/27/2010    To Whom it may Concern:   [name] [name] was seen in the clinic today by Traci Sermon, NP        Sincerely,   Wendall Mola CMA ( AAMA)

## 2010-05-27 NOTE — Progress Notes (Signed)
Summary: vicodin/ hla  Phone Note Call from Patient   Summary of Call: pt calls and ask for refill on vicodin, called [facility], last filled 10/07/2009, script refill from 7/18 given verb on ph. Initial call taken by: Marin Roberts RN,  November 17, 2009 2:30 PM

## 2010-05-27 NOTE — Assessment & Plan Note (Signed)
Summary: cough,congestion   Primary Provider:  Vassie Loll MD  CC:  pt. c/o dry cough, dizziness, headache x 1 week, and has sinus SX one month ago.  History of Present Illness: Pt complains of a dry cough for the last 4 to 5 days.  She denies fever or chills.  She does feel short of breath with exertion.  The cough is keeping her up at night.  Preventive Screening-Counseling & Management  Alcohol-Tobacco     Alcohol drinks/day: 0     Smoking Status: quit > 6 months     Smoking Cessation Counseling: yes     Packs/Day: 5 ciggs     Year Started: 1983     Year Quit: 2010     Passive Smoke Exposure: no  Caffeine-Diet-Exercise     Caffeine use/day: coffee occassionally     Does Patient Exercise: yes     Type of exercise: walking     Exercise (avg: min/session): <30     Times/week: 4  Hep-HIV-STD-Contraception     HIV Risk: no risk noted     HIV Risk Counseling: not indicated-no HIV risk noted     Sun Exposure-Excessive: occasionally  Safety-Violence-Falls     Seat Belt Use: yes      Sexual History:  currently monogamous.        Drug Use:  current and marijuana.     Updated Prior Medication List: TRUVADA 200-300 MG TABS (EMTRICITABINE-TENOFOVIR) one tab by mouth once daily LEXIVA 700 MG TABS (FOSAMPRENAVIR CALCIUM) 2 tablets by mouth once daily NORVIR 100 MG CAPS (RITONAVIR) 2 tablets by mouth once daily CLARITIN 10 MG  TABS (LORATADINE) Take 1 tablet by mouth once a day K-VESCENT 20 MEQ PACK (POTASSIUM CHLORIDE) Take 2  tablets by mouth daily. HYDROCHLOROTHIAZIDE 25 MG TABS (HYDROCHLOROTHIAZIDE) 1 tab by mouth daily SINGULAIR 10 MG TABS (MONTELUKAST SODIUM) Take 1 tablet by mouth once a day NORVASC 5 MG TABS (AMLODIPINE BESYLATE) Take 1/2 tablet by mouth once a day PRAVACHOL 40 MG TABS (PRAVASTATIN SODIUM) Take 1 tablet by mouth once a day PROTONIX 20 MG TBEC (PANTOPRAZOLE SODIUM) Take 1 tablet by mouth two times a day VICODIN 5-500 MG TABS (HYDROCODONE-ACETAMINOPHEN)  Take 1 tab every 8 hours as needed for pain. NAPROSYN 500 MG TABS (NAPROXEN) Take 1 tablet by mouth two times a day w/  food as needed for back pain ZITHROMAX Z-PAK 250 MG TABS (AZITHROMYCIN) take as directed TUSSIONEX PENNKINETIC ER 10-8 MG/5ML LQCR (HYDROCOD POLST-CHLORPHEN POLST) 5 ml by mouth two times a day  Current Allergies (reviewed today): ! TYLENOL ! MORPHINE ! * DIAZIDE Past History:  Past Medical History: Last updated: 05/09/2006 HIV disease (1994) Hepatitis C Hypertension GERD Allergic rhinitis Depression  Review of Systems  The patient denies anorexia, fever, weight loss, and hemoptysis.    Vital Signs:  Patient profile:   50 year old female Menstrual status:  postmenopausal Height:      61 inches (154.94 cm) Weight:      153.12 pounds (69.60 kg) BMI:     29.04 Temp:     98.3 degrees F (36.83 degrees C) oral Pulse rate:   120 / minute BP sitting:   156 / 99  (right arm)  Vitals Entered By: Wendall Mola CMA Duncan Dull) (March 16, 2010 2:04 PM) CC: pt. c/o dry cough, dizziness, headache x 1 week, has sinus SX one month ago Is Patient Diabetic? No Pain Assessment Patient in pain? yes     Location: headache Intensity:  10 Type: aching Onset of pain  Constant Nutritional Status BMI of 25 - 29 = overweight Nutritional Status Detail appetite "normal"  Have you ever been in a relationship where you felt threatened, hurt or afraid?No   Does patient need assistance? Functional Status Self care Ambulation Normal Comments no missed doses of HAART meds per pt.   Physical Exam  General:  alert, well-developed, well-nourished, and well-hydrated.   Head:  normocephalic and atraumatic.   Mouth:  pharynx pink and moist.   Lungs:  normal breath sounds.     Impression & Recommendations:  Problem # 1:  BRONCHITIS (ICD-490) Will treat with a z-pack and tussionex. Orders: Est. Patient Level III (78469)  Medications Added to Medication List This  Visit: 1)  Zithromax Z-pak 250 Mg Tabs (Azithromycin) .... Take as directed 2)  Tussionex Pennkinetic Er 10-8 Mg/79ml Lqcr (Hydrocod polst-chlorphen polst) .... 5 ml by mouth two times a day Prescriptions: TUSSIONEX PENNKINETIC ER 10-8 MG/5ML LQCR (HYDROCOD POLST-CHLORPHEN POLST) 5 ml by mouth two times a day  #8 oz x 0   Entered and Authorized by:   Yisroel Ramming MD   Signed by:   Yisroel Ramming MD on 03/16/2010   Method used:   Print then Mail to Patient   RxID:   [id number] ZITHROMAX Z-PAK 250 MG TABS (AZITHROMYCIN) take as directed  #1 pack x 0   Entered and Authorized by:   Yisroel Ramming MD   Signed by:   Yisroel Ramming MD on 03/16/2010   Method used:   Print then Mail to Patient   RxID:   [id number]   Appended Document: cough,congestion RX for Z-pak called to [facility]

## 2010-05-27 NOTE — Progress Notes (Signed)
Summary: refill/ hla  Phone Note Refill Request Message from:  Fax from Pharmacy on March 05, 2010 10:57 AM  Refills Requested: Medication #1:  HYDROCHLOROTHIAZIDE 25 MG TABS 1 tab by mouth daily   Dosage confirmed as above?Dosage Confirmed missed ED f/u this week for chest pain, last appt 10/18, last labs 10/7 and 8/31  Initial call taken by: Marin Roberts RN,  March 05, 2010 10:58 AM  Follow-up for Phone Call        Refill approved-nurse to complete    Prescriptions: HYDROCHLOROTHIAZIDE 25 MG TABS (HYDROCHLOROTHIAZIDE) 1 tab by mouth daily  #31 x 6   Entered and Authorized by:   Vassie Loll MD   Signed by:   Vassie Loll MD on 03/06/2010   Method used:   Telephoned to ...       [facility] (retail)       [address]       [place], [place]  [address]  [place]       Ph: [phone]       Fax: [phone]   RxID:   [id number]   Appended Document: refill/ hla This Rx was never called in and pt is out of meds. I called [facility] and called in the Rx with only 4 refills as 2 months have passed.

## 2010-05-27 NOTE — Progress Notes (Signed)
Summary: Mediation change to covered product on Medicaid  Phone Note From Pharmacy   Caller: [facility] Details for Reason: Please change to covered drug Summary of Call: Received a request from [facility] -a Medicaid Preferred Drug list Medication change for Protonix to Nexium, Omeprazole or OTC Prilosec.  Dr. Ninetta Lights changed medicaton to Nexium 40mg  once daily.  The [facility] has been verbally notified. Initial call taken by: Paulo Fruit  BS,CPht II,MPH,  November 12, 2009 10:14 AM    New/Updated Medications: NEXIUM 40 MG CPDR (ESOMEPRAZOLE MAGNESIUM) Take 1 capsule by mouth once a day Prescriptions: NEXIUM 40 MG CPDR (ESOMEPRAZOLE MAGNESIUM) Take 1 capsule by mouth once a day  #30 x 12   Entered by:   Paulo Fruit  BS,CPht II,MPH   Authorized by:   Johny Sax MD   Signed by:   Paulo Fruit  BS,CPht II,MPH on 11/12/2009   Method used:   Telephoned to ...       [facility] (mail-order)       [address]       [place], [place]  [address]       Ph: [phone]       Fax: [phone]   RxID:   [id number]  spoke to [place] who d/c Pantroprazole and started Nexium.  She read back the order. Patient notified of the change. Paulo Fruit  BS,CPht II,MPH  November 12, 2009 10:20 AM

## 2010-05-27 NOTE — Assessment & Plan Note (Signed)
Summary: chest congestion/cough /tkk   Vital Signs:  Patient profile:   50 year old female Menstrual status:  postmenopausal Height:      61 inches (154.94 cm) Weight:      154.0 pounds (70 kg) BMI:     29.20 Temp:     97.6 degrees F (36.44 degrees C) oral Pulse rate:   98 / minute BP sitting:   130 / 88  (right arm)  Vitals Entered By: Wendall Mola CMA ( AAMA) (April 27, 2010 3:13 PM) CC: pt. c/o productive cough, sneezing x 5 days, no fever Is Patient Diabetic? No Pain Assessment Patient in pain? no      Nutritional Status BMI of 25 - 29 = overweight Nutritional Status Detail appetite "not good"  Have you ever been in a relationship where you felt threatened, hurt or afraid?No   Does patient need assistance? Functional Status Self care Ambulation Normal Comments no missed doses of meds per pt.   Primary Provider:  Vassie Loll MD  CC:  pt. c/o productive cough, sneezing x 5 days, and no fever.  History of Present Illness: Cough, head congestion, sneezing for past 5 days with thick yelloish sputum.  No fever, chills or sweats. Treated in November 2011 for URI, and now with symptoms again, although "not as severe".  States has been around multiple children over the holidays, some of whom had "colds."  Preventive Screening-Counseling & Management  Alcohol-Tobacco     Alcohol drinks/day: 0     Smoking Status: quit > 6 months     Smoking Cessation Counseling: yes     Packs/Day: 5 ciggs     Year Started: 1983     Year Quit: 2010     Passive Smoke Exposure: no  Caffeine-Diet-Exercise     Caffeine use/day: coffee occassionally     Does Patient Exercise: yes     Type of exercise: walking     Exercise (avg: min/session): <30     Times/week: 4  Hep-HIV-STD-Contraception     HIV Risk: no risk noted     HIV Risk Counseling: not indicated-no HIV risk noted     Sun Exposure-Excessive: occasionally  Safety-Violence-Falls     Seat Belt Use: yes      Sexual  History:  currently monogamous.        Drug Use:  current and marijuana.    Allergies: 1)  ! Tylenol 2)  ! Morphine 3)  ! * Diazide  Physical Exam  General:  alert, well-developed, well-nourished, and well-hydrated.   Head:  normocephalic and atraumatic.   Eyes:  pupils equal, pupils round, and pupils reactive to light.   Ears:  L TM bulging but no air-fluid level noted. Nose:  mucosal edema, L frontal sinus tenderness, L maxillary sinus tenderness, R frontal sinus tenderness, and R maxillary sinus tenderness.   Mouth:  postnasal drip.   Neck:  No deformities, masses, or tenderness noted. Lungs:  Mild crackles in Right lower post base, otherwise lungs clear to auscultation. Heart:  Normal rate and regular rhythm. S1 and S2 normal without gallop, murmur, click, rub or other extra sounds. Abdomen:  soft, non-tender, and normal bowel sounds.   Extremities:  no edema Skin:  Intact without suspicious lesions or rashes Cervical Nodes:  Small soft A&P LAD.   Impression & Recommendations:  Problem # 1:  UPPER RESPIRATORY INFECTION (ICD-465.9) Azythroimycin 500mg  x 1 dose then 250mg  by mouth once daily for remainder of 5 days total therapy. Guaefennesin  DM (robitussin or mucinex) as directed as needed  Try cirtirizine 10 mg by mouth once daily for next 7-10 days then as needed thereafter. Hold Claritin while taking cirtirizine. Ibuprofen for pain and fever (hold naproxen while taking ibuprofen)  Her updated medication list for this problem includes:    Claritin 10 Mg Tabs (Loratadine) .Marland Kitchen... Take 1 tablet by mouth on ce a day    Naprosyn 500 Mg Tabs (Naproxen) .Marland Kitchen... Take 1 tablet by mouth two times a day w/  food as needed for back pain  Orders: Est. Patient Level III (04540)  Problem # 2:  HIV DISEASE (ICD-042) Obtain staging labs in 4 weeks with scheduled follow-up with Dr. Ninetta Lights in 6 weeks. Her updated medication list for this problem includes:    Azithromycin 250 Mg Tabs  (Azithromycin) .Marland Kitchen... 2 tablets now then 1 tablet by mouth once daily for remainder of 5 days.  Future Orders: T-CBC w/Diff [id number]) ... 05/25/2010 T-CD4SP ([facility]) (CD4SP) ... 05/25/2010 T-Comprehensive Metabolic Panel [phone]) ... 05/25/2010 T-HIV Viral Load [phone]) ... 05/25/2010  Medications Added to Medication List This Visit: 1)  Azithromycin 250 Mg Tabs (Azithromycin) .... 2 tablets now then 1 tablet by mouth once daily for remainder of 5 days.  Patient Instructions: 1)  Take antibiotic until gone as directed 2)  Use Robitussin DM or Mucinex DM (or their equivalents) as directed. 3)  Take Zyrtec 10 mg (or store brand equivalent) once daily for 5-7 days. 4)  Recommend increasing fluid intake for hydration for the next few days. 5)  Take 400-600mg  of Ibuprofen (Advil, Motrin) every 4-6 hours as needed for relief of pain or comfort of fever. 6)  If symptoms worsen, fever develops or shortness of breath increases, contact clinic or go to urgent care center. 7)  Return to clinic for repeat labs in 4 weeks, and schedule follow-up visit with Dr. Ninetta Lights in 6 weeks. Prescriptions: AZITHROMYCIN 250 MG TABS (AZITHROMYCIN) 2 tablets now then 1 tablet by mouth once daily for remainder of 5 days.  #6 x 0   Entered and Authorized by:   Talmadge Chad NP   Signed by:   Talmadge Chad NP on 04/27/2010   Method used:   Faxed to ...       [facility] (retail)       [address]       [place], [place]  [address]  [place]       Ph: [phone]       Fax: [phone]   RxID:   [id number]    Orders Added: 1)  Est. Patient Level III [99213] 2)  T-CBC w/Diff [[id number]] 3)  T-CD4SP [facility]) [CD4SP] 4)  T-Comprehensive Metabolic Panel [[id number]] 5)  T-HIV Viral Load [phone]

## 2010-05-27 NOTE — Progress Notes (Signed)
Summary: refill/gg  Phone Note Refill Request  on December 24, 2009 8:57 AM  Refills Requested: Medication #1:  K-VESCENT 20 MEQ PACK Take 2  tablets by mouth daily. Pt called and wants to know if she still needs to take potassium, if so needs refill   Method Requested: Electronic Initial call taken by: Merrie Roof RN,  December 24, 2009 8:57 AM  Follow-up for Phone Call        Yes she needs to continue taking her potassium as directed. Last electrolytes checked demonstarted potassium in the 3.4 range (taking potassium).    Prescriptions: K-VESCENT 20 MEQ PACK (POTASSIUM CHLORIDE) Take 2  tablets by mouth daily.  #62 x 6   Entered and Authorized by:   Vassie Loll MD   Signed by:   Vassie Loll MD on 12/24/2009   Method used:   Telephoned to ...       [facility] (mail-order)       [address]       [place], [place]  [address]       Ph: [phone]       Fax: [phone]   RxID:   [id number]   Appended Document: refill/gg Pt informed

## 2010-05-28 NOTE — Letter (Signed)
Summary: Letter  Letter   Imported By: Margie Billet 02/09/2010 14:46:12  _____________________________________________________________________  External Attachment:    Type:   Image     Comment:   External Document

## 2010-06-01 ENCOUNTER — Telehealth: Payer: Self-pay | Admitting: *Deleted

## 2010-06-01 NOTE — Telephone Encounter (Signed)
Pt calls and would like a referral to orth for her back pain. PLEASE SEND THIS TO YOUR NURSE FOR THE REFERRAL TO BE DONE  Thanks, h.

## 2010-06-02 NOTE — Progress Notes (Signed)
Summary: refill/ hla  Phone Note Refill Request Message from:  Patient on May 24, 2010 1:46 PM  Refills Requested: Medication #1:  VICODIN 5-500 MG TABS Take 1 tab every 8 hours as needed for pain.   Dosage confirmed as above?Dosage Confirmed   Last Refilled: 10/30 back pain, pt states "worse pain i have ever had" ask to go to urg care, states she doesn't know if she can get there but she will be seeing dr hatcher tomorrow, it is suggested that she see urg care tomorrow also.  Initial call taken by: Marin Roberts RN,  May 24, 2010 1:46 PM  Follow-up for Phone Call        Refill approved-nurse to complete Follow-up by: Ulyess Mort MD,  May 24, 2010 1:53 PM  Additional Follow-up for Phone Call Additional follow up Details #1::        Rx called to pharmacy Additional Follow-up by: Marin Roberts RN,  May 24, 2010 3:52 PM    Prescriptions: VICODIN 5-500 MG TABS (HYDROCODONE-ACETAMINOPHEN) Take 1 tab every 8 hours as needed for pain.  #30 x 0   Entered and Authorized by:   Ulyess Mort MD   Signed by:   Ulyess Mort MD on 05/24/2010   Method used:   Telephoned to ...       [facility] (retail)       [address]       [place], [place]  [address]  [place]       Ph: [phone]       Fax: [phone]   RxID:   [id number]

## 2010-06-04 ENCOUNTER — Other Ambulatory Visit: Payer: Self-pay | Admitting: *Deleted

## 2010-06-06 MED ORDER — AMLODIPINE BESYLATE 10 MG PO TABS: 10.0000 mg | ORAL_TABLET | Freq: Every day | ORAL | Status: DC

## 2010-06-06 MED ORDER — NAPROXEN 500 MG PO TABS: 500.0000 mg | ORAL_TABLET | Freq: Two times a day (BID) | ORAL | Status: DC | PRN

## 2010-06-07 ENCOUNTER — Encounter: Payer: Self-pay | Admitting: Infectious Diseases

## 2010-06-09 ENCOUNTER — Other Ambulatory Visit: Payer: Self-pay | Admitting: Infectious Diseases

## 2010-06-09 ENCOUNTER — Encounter: Payer: Self-pay | Admitting: Infectious Diseases

## 2010-06-09 ENCOUNTER — Ambulatory Visit (INDEPENDENT_AMBULATORY_CARE_PROVIDER_SITE_OTHER): Payer: Medicaid Other | Admitting: Infectious Diseases

## 2010-06-09 ENCOUNTER — Ambulatory Visit
Admission: RE | Admit: 2010-06-09 | Discharge: 2010-06-09 | Disposition: A | Discharge status: Home / Self Care | Payer: Medicaid Other | Source: Ambulatory Visit | Attending: Infectious Diseases | Admitting: Infectious Diseases

## 2010-06-09 ENCOUNTER — Telehealth (INDEPENDENT_AMBULATORY_CARE_PROVIDER_SITE_OTHER): Payer: Self-pay | Admitting: *Deleted

## 2010-06-09 DIAGNOSIS — I10 Essential (primary) hypertension: Secondary | ICD-10-CM

## 2010-06-09 DIAGNOSIS — Z1231 Encounter for screening mammogram for malignant neoplasm of breast: Secondary | ICD-10-CM

## 2010-06-09 DIAGNOSIS — B20 Human immunodeficiency virus [HIV] disease: Secondary | ICD-10-CM

## 2010-06-09 LAB — CONVERTED CEMR LAB
Cholesterol: 214 mg/dL — ABNORMAL HIGH (ref 0–200)
Pro B Natriuretic peptide (BNP): <30 pg/mL (ref 0.0–100.0)
Total CK: 141 units/L (ref 7–177)

## 2010-06-14 ENCOUNTER — Encounter (INDEPENDENT_AMBULATORY_CARE_PROVIDER_SITE_OTHER): Payer: Self-pay | Admitting: *Deleted

## 2010-06-16 ENCOUNTER — Ambulatory Visit (HOSPITAL_COMMUNITY)
Admission: RE | Admit: 2010-06-16 | Discharge: 2010-06-16 | Disposition: A | Discharge status: Home / Self Care | Payer: Medicaid Other | Source: Ambulatory Visit | Attending: Infectious Diseases | Admitting: Infectious Diseases

## 2010-06-16 DIAGNOSIS — E785 Hyperlipidemia, unspecified: Secondary | ICD-10-CM | POA: Insufficient documentation

## 2010-06-16 DIAGNOSIS — I10 Essential (primary) hypertension: Secondary | ICD-10-CM | POA: Insufficient documentation

## 2010-06-16 DIAGNOSIS — Z8249 Family history of ischemic heart disease and other diseases of the circulatory system: Secondary | ICD-10-CM | POA: Insufficient documentation

## 2010-06-16 DIAGNOSIS — I517 Cardiomegaly: Secondary | ICD-10-CM

## 2010-06-16 NOTE — Progress Notes (Signed)
Summary: IM appt. scheduled  Phone Note Outgoing Call   Call placed by: Annice Pih Summary of Call: Pt. scheduled appt. with Catheryn Bacon [facility] 07/01/10 at 2:15 pm pt. notified Initial call taken by: Wendall Mola CMA Duncan Dull),  June 09, 2010 4:37 PM

## 2010-06-16 NOTE — Assessment & Plan Note (Signed)
Summary: F/U NEW TO MD   Vital Signs:  Patient profile:   50 year old female Menstrual status:  postmenopausal Height:      61 inches (154.94 cm) Weight:      154.0 pounds (70 kg) BMI:     29.20 O2 Sat:      99 % on Room air Temp:     98.2 degrees F (36.78 degrees C) oral Pulse rate:   101 / minute BP sitting:   147 / 63  (right arm)  Vitals Entered By: Wendall Mola CMA ( AAMA) (June 09, 2010 2:02 PM)  O2 Flow:  Room air CC: follow-up visit, lab results, pt. c/o SHOB and left leg swollen yesterday evening, chest pain off and on Is Patient Diabetic? No Pain Assessment Patient in pain? yes     Location: chest Intensity: 6 Type: sharp Onset of pain  Intermittent Nutritional Status BMI of 25 - 29 = overweight Nutritional Status Detail appetite "lower"  Have you ever been in a relationship where you felt threatened, hurt or afraid?No   Does patient need assistance? Functional Status Self care Ambulation Normal Comments no missed doses of HAART meds per pt.   Primary Provider:  Vassie Loll MD  CC:  follow-up visit, lab results, pt. c/o SHOB and left leg swollen yesterday evening, and chest pain off and on.  History of Present Illness: 50 yo F with hx of Hep C and HIV. She had nasal septoplasty 01-05-10. Her last CD4 770 and VL <20 (05-25-10). Pap normal 05-20-10.Her breathing is much better since having septoplasty.  Today c/o retaining fluid in her L leg and increased DOE. Has increased norvasc dose which she takes at  night. Feels similar to her prev CHF exacerbation. Feels heavy in her chest and like fluid is building up. Occas cough. Going to ortho tomorrow for injections.   Preventive Screening-Counseling & Management  Alcohol-Tobacco     Alcohol drinks/day: 0     Smoking Status: quit > 6 months     Smoking Cessation Counseling: yes     Packs/Day: 5 ciggs     Year Started: 1983     Year Quit: 2010     Passive Smoke Exposure:  no  Caffeine-Diet-Exercise     Caffeine use/day: coffee occassionally     Does Patient Exercise: yes     Type of exercise: walking     Exercise (avg: min/session): <30     Times/week: 4  Hep-HIV-STD-Contraception     HIV Risk: no risk noted     HIV Risk Counseling: not indicated-no HIV risk noted     Sun Exposure-Excessive: occasionally  Safety-Violence-Falls     Seat Belt Use: yes      Sexual History:  currently monogamous.        Drug Use:  current and marijuana.    Comments:  pt. given condoms  Current Medications (verified): 1)  Truvada 200-300 Mg Tabs (Emtricitabine-Tenofovir) .... One Tab By Mouth Once Daily 2)  Lexiva 700 Mg Tabs (Fosamprenavir Calcium) .... 2 Tablets By Mouth Once Daily 3)  Norvir 100 Mg Caps (Ritonavir) .... 2 Tablets By Mouth Once Daily 4)  Claritin 10 Mg  Tabs (Loratadine) .... Take 1 Tablet By Mouth Once A Day 5)  K-Vescent 20 Meq Pack (Potassium Chloride) .... Take 2  Tablets By Mouth Daily. 6)  Hydrochlorothiazide 25 Mg Tabs (Hydrochlorothiazide) .Marland Kitchen.. 1 Tab By Mouth Daily 7)  Singulair 10 Mg Tabs (Montelukast Sodium) .... Take 1 Tablet By  Mouth Once A Day 8)  Norvasc 10 Mg Tabs (Amlodipine Besylate) .... Take 1 Tablet By Mouth Once A Day 9)  Pravachol 40 Mg Tabs (Pravastatin Sodium) .... Take 1 Tablet By Mouth Once A Day 10)  Protonix 20 Mg Tbec (Pantoprazole Sodium) .... Take 1 Tablet By Mouth Two Times A Day 11)  Vicodin 5-500 Mg Tabs (Hydrocodone-Acetaminophen) .... Take 1 Tab Every 8 Hours As Needed For Pain. 12)  Naprosyn 500 Mg Tabs (Naproxen) .... Take 1 Tablet By Mouth Two Times A Day W/  Food As Needed For Back Pain 13)  Ventolin Hfa 108 (90 Base) Mcg/act Aers (Albuterol Sulfate) .... Inhale 1-2 Puff Every 6 Hrs As Needed As Needed For Shortness of Breath.  Allergies: 1)  ! Tylenol 2)  ! Morphine 3)  ! * Diazide  Review of Systems       having twitches in her chest. very fast.  Physical Exam  General:  well-developed,  well-nourished, and overweight-appearing.   Eyes:  pupils equal, pupils round, and pupils reactive to light.   Mouth:  pharynx pink and moist, no exudates, and teeth missing.   Neck:  no masses.   Lungs:  normal respiratory effort, R base crackles, and L base crackles.   Heart:  normal rate, regular rhythm, and no murmur.   Abdomen:  soft, non-tender, and normal bowel sounds.     Impression & Recommendations:  Problem # 1:  HIV DISEASE (ICD-042)  she is doing well with her HIV. Will add RPR and lipids today. her numbers are very good. given condoms. return to clinic 3-4 months.  The following medications were removed from the medication list:    Azithromycin 250 Mg Tabs (Azithromycin) .Marland Kitchen... 2 tablets now then 1 tablet by mouth once daily for remainder of 5 days.  Orders: T-BNP  (B Natriuretic Peptide) [phone]) T-CK Total [id number]) T-CK MB Fract ([id number]) T-Troponin I ([id number]) CXR- 2view (CXR) 12 Lead EKG (12 Lead EKG) 2 D Echo (2 D Echo)  Problem # 2:  HYPERTENSION, ESSENTIAL NOS (ICD-401.9)  will check her BNP, CXR, TTE, CPK, ECG. will return to clinic as labs dictate. otherwise return to IM clinic in next 2 weeks.  Her updated medication list for this problem includes:    Hydrochlorothiazide 25 Mg Tabs (Hydrochlorothiazide) .Marland Kitchen... 1 tab by mouth daily    Norvasc 10 Mg Tabs (Amlodipine besylate) .Marland Kitchen... Take 1 tablet by mouth once a day  Orders: T-BNP  (B Natriuretic Peptide) [id number]) T-CK Total [id number]) T-CK MB Fract ([address]) T-Troponin I ([id number]) CXR- 2view (CXR) 12 Lead EKG (12 Lead EKG) 2 D Echo (2 D Echo)  Other Orders: T-Lipid Profile ([id number]) T-RPR (Syphilis) (28315-17616) Est. Patient Level IV ([address])    Orders Added: 1)  T-Lipid Profile [[id number]] 2)  T-RPR (Syphilis) [06269-48546] 3)  Est. Patient Level IV [[address]] 4)  T-BNP  (B Natriuretic Peptide) [83880-55185] 5)  T-CK Total [82550-23250] 6)  T-CK MB Fract  [[address]] 7)  T-Troponin I [[id number]] 8)  CXR- 2view [CXR] 9)  12 Lead EKG [12 Lead EKG] 10)  2 D Echo [2 D Echo]          Medication Adherence: 06/09/2010   Adherence to medications reviewed with patient. Counseling to provide adequate adherence provided   Prevention For Positives: 06/09/2010   Safe sex practices discussed with patient. Condoms offered.  Appended Document: Orders Update    Clinical Lists Changes  Orders: Added new Test order of Mammogram (Screening) (Mammo) - Signed

## 2010-06-17 ENCOUNTER — Emergency Department (HOSPITAL_COMMUNITY): Payer: Medicaid Other

## 2010-06-17 ENCOUNTER — Telehealth (INDEPENDENT_AMBULATORY_CARE_PROVIDER_SITE_OTHER): Payer: Self-pay | Admitting: Licensed Clinical Social Worker

## 2010-06-17 ENCOUNTER — Encounter: Payer: Self-pay | Admitting: Internal Medicine

## 2010-06-17 ENCOUNTER — Emergency Department (HOSPITAL_COMMUNITY)
Admission: EM | Admit: 2010-06-17 | Discharge: 2010-06-17 | Disposition: A | Discharge status: Home / Self Care | Payer: Medicaid Other | Attending: Emergency Medicine | Admitting: Emergency Medicine

## 2010-06-17 ENCOUNTER — Ambulatory Visit: Payer: Medicaid Other

## 2010-06-17 DIAGNOSIS — R0602 Shortness of breath: Secondary | ICD-10-CM | POA: Insufficient documentation

## 2010-06-17 DIAGNOSIS — Z21 Asymptomatic human immunodeficiency virus [HIV] infection status: Secondary | ICD-10-CM | POA: Insufficient documentation

## 2010-06-17 DIAGNOSIS — F329 Major depressive disorder, single episode, unspecified: Secondary | ICD-10-CM | POA: Insufficient documentation

## 2010-06-17 DIAGNOSIS — F3289 Other specified depressive episodes: Secondary | ICD-10-CM | POA: Insufficient documentation

## 2010-06-17 DIAGNOSIS — I10 Essential (primary) hypertension: Secondary | ICD-10-CM | POA: Insufficient documentation

## 2010-06-17 DIAGNOSIS — M79609 Pain in unspecified limb: Secondary | ICD-10-CM

## 2010-06-17 DIAGNOSIS — M7989 Other specified soft tissue disorders: Secondary | ICD-10-CM | POA: Insufficient documentation

## 2010-06-17 DIAGNOSIS — Z8619 Personal history of other infectious and parasitic diseases: Secondary | ICD-10-CM | POA: Insufficient documentation

## 2010-06-17 DIAGNOSIS — R0989 Other specified symptoms and signs involving the circulatory and respiratory systems: Secondary | ICD-10-CM | POA: Insufficient documentation

## 2010-06-17 DIAGNOSIS — R059 Cough, unspecified: Secondary | ICD-10-CM | POA: Insufficient documentation

## 2010-06-17 DIAGNOSIS — R05 Cough: Secondary | ICD-10-CM | POA: Insufficient documentation

## 2010-06-17 DIAGNOSIS — R079 Chest pain, unspecified: Secondary | ICD-10-CM | POA: Insufficient documentation

## 2010-06-17 DIAGNOSIS — R0609 Other forms of dyspnea: Secondary | ICD-10-CM | POA: Insufficient documentation

## 2010-06-17 DIAGNOSIS — R609 Edema, unspecified: Secondary | ICD-10-CM | POA: Insufficient documentation

## 2010-06-17 DIAGNOSIS — Z79899 Other long term (current) drug therapy: Secondary | ICD-10-CM | POA: Insufficient documentation

## 2010-06-17 LAB — CBC
Hemoglobin: 12.9 g/dL (ref 12.0–15.0)
MCHC: 33.7 g/dL (ref 30.0–36.0)
Platelets: 218 10*3/uL (ref 150–400)
RDW: 16.2 % — ABNORMAL HIGH (ref 11.5–15.5)

## 2010-06-17 LAB — POCT I-STAT, CHEM 8
BUN: 8 mg/dL (ref 6–23)
Calcium, Ion: 1.04 mmol/L — ABNORMAL LOW (ref 1.12–1.32)
Chloride: 104 mEq/L (ref 96–112)
Potassium: 3.1 mEq/L — ABNORMAL LOW (ref 3.5–5.1)

## 2010-06-17 LAB — POCT CARDIAC MARKERS
CKMB, poc: <1 ng/mL — ABNORMAL LOW (ref 1.0–8.0)
Troponin i, poc: <0.05 ng/mL (ref 0.00–0.09)

## 2010-06-17 LAB — D-DIMER, QUANTITATIVE: D-Dimer, Quant: 0.38 ug/mL-FEU (ref 0.00–0.48)

## 2010-06-22 NOTE — Miscellaneous (Signed)
  Clinical Lists Changes 

## 2010-06-22 NOTE — Progress Notes (Signed)
Summary: patient having leg pain/TY  Phone Note Call from Patient   Caller: Patient Call For: Dr. Ninetta Lights Summary of Call: Patient is complaining of bilateral leg pain and swelling she wanted to be seen today. I explained to her that we had one provider in the office, and her symtoms were urgent and should be followed at the ED or Urgent Care to rule out DVT. She agreed and she stated that she would go today. Initial call taken by: Starleen Arms CMA,  June 17, 2010 10:51 AM

## 2010-06-24 ENCOUNTER — Ambulatory Visit: Payer: Self-pay | Admitting: Infectious Diseases

## 2010-06-29 ENCOUNTER — Other Ambulatory Visit: Payer: Self-pay | Admitting: *Deleted

## 2010-06-30 MED ORDER — POTASSIUM CHLORIDE 20 MEQ PO PACK: 20.0000 meq | PACK | Freq: Two times a day (BID) | ORAL | Status: DC

## 2010-06-30 MED ORDER — PANTOPRAZOLE SODIUM 20 MG PO TBEC: 20.0000 mg | DELAYED_RELEASE_TABLET | Freq: Two times a day (BID) | ORAL | Status: DC

## 2010-07-01 ENCOUNTER — Ambulatory Visit (INDEPENDENT_AMBULATORY_CARE_PROVIDER_SITE_OTHER): Payer: Medicaid Other | Admitting: Internal Medicine

## 2010-07-01 ENCOUNTER — Encounter: Payer: Self-pay | Admitting: Internal Medicine

## 2010-07-01 VITALS — BP 136/89 | HR 100 | Temp 97.1°F | Wt 158.4 lb

## 2010-07-01 DIAGNOSIS — B20 Human immunodeficiency virus [HIV] disease: Secondary | ICD-10-CM

## 2010-07-01 DIAGNOSIS — B029 Zoster without complications: Secondary | ICD-10-CM

## 2010-07-01 DIAGNOSIS — B0229 Other postherpetic nervous system involvement: Secondary | ICD-10-CM | POA: Insufficient documentation

## 2010-07-01 DIAGNOSIS — I872 Venous insufficiency (chronic) (peripheral): Secondary | ICD-10-CM | POA: Insufficient documentation

## 2010-07-01 DIAGNOSIS — I10 Essential (primary) hypertension: Secondary | ICD-10-CM

## 2010-07-01 MED ORDER — VALACYCLOVIR HCL 1 G PO TABS: 1000.0000 mg | ORAL_TABLET | Freq: Three times a day (TID) | ORAL | Status: AC

## 2010-07-01 NOTE — Patient Instructions (Signed)
Please call the Clinic if your rash does not get better in about 2 weeks. Please try to avoid close contact with kids if possible and can cover the rash until it is crusted.  Please take medications as instructed.

## 2010-07-01 NOTE — Assessment & Plan Note (Signed)
Lab Results  Component Value Date   NA 141 06/17/2010   K 3.1* 06/17/2010   CL 104 06/17/2010   CO2 27 05/25/2010   BUN 8 06/17/2010   CREATININE 0.9 06/17/2010    BP Readings from Last 3 Encounters:  07/01/10 136/89  06/09/10 147/63  05/07/10 132/88    BP well controlled and will continue current meds and has been on KCl.

## 2010-07-01 NOTE — Assessment & Plan Note (Signed)
Recent CD4 770 and VL undetectable. Will continue her ART meds.

## 2010-07-01 NOTE — Progress Notes (Signed)
  Subjective:    [name], female    DOB: Sep 13, 1960, 50 y.o.   MRN: [id number]  HPI [name] [name] is a 50 year old woman with pmh significant for HIV, OA, HLD, HTN, Depression who presents to the clinic for the rash in her back and medial side of her left breast. This started one day ago when she took a shower, slightly pai and itching. She has no fever, chest pain, or sick contacts. 2 Days ago she had steroid injection in her back for OA. She also has felt left leg swelling.     Review of Systems  Constitutional: Negative for fever, chills and activity change.  HENT: Negative for ear pain, nosebleeds, sore throat, sneezing and neck pain.   Eyes: Negative for discharge and redness.  Respiratory: Negative for apnea, cough and wheezing.   Cardiovascular: Negative for chest pain, palpitations and leg swelling.  Gastrointestinal: Negative for nausea, vomiting, abdominal pain and constipation.  Genitourinary: Negative for dysuria and urgency.  Musculoskeletal: Positive for back pain.  Skin: Positive for rash. Negative for color change.       Objective:   Physical Exam  Constitutional: She is oriented to person, place, and time. She appears well-developed and well-nourished.  HENT:  Head: Normocephalic and atraumatic.  Eyes: Conjunctivae and EOM are normal. Pupils are equal, round, and reactive to light. Right eye exhibits no discharge. Left eye exhibits no discharge.  Neck: Normal range of motion. Neck supple. No JVD present. No thyromegaly present.  Cardiovascular: Normal rate, regular rhythm, normal heart sounds and intact distal pulses.   Pulmonary/Chest: Effort normal and breath sounds normal.  Abdominal: Soft. Bowel sounds are normal. She exhibits no distension. There is no tenderness.  Musculoskeletal: Normal range of motion. She exhibits no edema and no tenderness.  Lymphadenopathy:    She has no cervical adenopathy.  Neurological: She is alert and oriented to person,  place, and time. She has normal reflexes.  Skin: Rash noted.          There are erythematous papules in her mid back (about 6x5 cm), also scaterred similar rash in her medial side of her left breast and lower lateral side of her left breast. No crust or skin broken.           Assessment & Plan:

## 2010-07-01 NOTE — Assessment & Plan Note (Signed)
Her rash is likely shingles and will start valtrex for 7 days and will recheck in about 2 weeks if no improvement. Advised to avoid contact with kids if possible and can cover the rash until it is crusted.

## 2010-07-01 NOTE — Assessment & Plan Note (Signed)
Her left swelling happens when standing longer and recent Doppler of leg unremarkable, d-dimer wnl. This is likely due to venous insufficiency and advised to use elastic stockings and not stand too long.

## 2010-07-06 LAB — URINALYSIS, ROUTINE W REFLEX MICROSCOPIC
Bilirubin Urine: NEGATIVE
Nitrite: NEGATIVE
Specific Gravity, Urine: 1.014 (ref 1.005–1.030)
pH: 5 (ref 5.0–8.0)

## 2010-07-06 LAB — BRAIN NATRIURETIC PEPTIDE: Pro B Natriuretic peptide (BNP): <30 pg/mL (ref 0.0–100.0)

## 2010-07-06 LAB — BASIC METABOLIC PANEL
Calcium: 9.6 mg/dL (ref 8.4–10.5)
GFR calc Af Amer: >60 mL/min (ref >60)
GFR calc non Af Amer: >60 mL/min (ref >60)
Potassium: 3.6 mEq/L (ref 3.5–5.1)
Sodium: 137 mEq/L (ref 135–145)

## 2010-07-06 LAB — DIFFERENTIAL
Basophils Relative: 0 % (ref 0–1)
Lymphs Abs: 3 10*3/uL (ref 0.7–4.0)
Monocytes Relative: 6 % (ref 3–12)
Neutro Abs: 4.2 10*3/uL (ref 1.7–7.7)
Neutrophils Relative %: 54 % (ref 43–77)

## 2010-07-06 LAB — POCT CARDIAC MARKERS
CKMB, poc: <1 ng/mL — ABNORMAL LOW (ref 1.0–8.0)
Myoglobin, poc: 50.6 ng/mL (ref 12–200)
Myoglobin, poc: 51.3 ng/mL (ref 12–200)
Troponin i, poc: <0.05 ng/mL (ref 0.00–0.09)

## 2010-07-06 LAB — CBC
Hemoglobin: 13.1 g/dL (ref 12.0–15.0)
Platelets: 193 10*3/uL (ref 150–400)
RBC: 4.73 MIL/uL (ref 3.87–5.11)

## 2010-07-06 LAB — D-DIMER, QUANTITATIVE: D-Dimer, Quant: <0.22 ug/mL-FEU (ref 0.00–0.48)

## 2010-07-08 ENCOUNTER — Encounter: Payer: Self-pay | Admitting: Adult Health

## 2010-07-08 ENCOUNTER — Telehealth: Payer: Self-pay | Admitting: *Deleted

## 2010-07-08 LAB — BASIC METABOLIC PANEL
BUN: 6 mg/dL (ref 6–23)
Calcium: 9.3 mg/dL (ref 8.4–10.5)
Creatinine, Ser: 0.69 mg/dL (ref 0.4–1.2)
GFR calc non Af Amer: >60 mL/min (ref >60)
Glucose, Bld: 103 mg/dL — ABNORMAL HIGH (ref 70–99)

## 2010-07-08 MED ORDER — FLUCONAZOLE 150 MG PO TABS: 150.0000 mg | ORAL_TABLET | Freq: Once | ORAL | Status: AC

## 2010-07-08 NOTE — Telephone Encounter (Signed)
Pt informed and voices understanding 

## 2010-07-08 NOTE — Telephone Encounter (Signed)
Pt called with c/o irritation to vaginal area.  She feels it's a yeast infection and is asking for diflucan to be called into [facility]. She is having itching, denies d/c.  She is  on valtrex  at this time.

## 2010-07-08 NOTE — Telephone Encounter (Signed)
Will provide diflucan x 1 dose.  If symptoms do not improve, she will have to be seen next week.

## 2010-07-16 ENCOUNTER — Other Ambulatory Visit: Payer: Self-pay | Admitting: Internal Medicine

## 2010-07-16 MED ORDER — PRAVASTATIN SODIUM 40 MG PO TABS: 40.0000 mg | ORAL_TABLET | Freq: Every day | ORAL | Status: DC

## 2010-07-16 NOTE — Telephone Encounter (Signed)
I am not sure why she is requesting fluconazole.  Is she having symptoms?  If so, she needs an appt to be evaluated.  Thanks!

## 2010-07-16 NOTE — Telephone Encounter (Signed)
Call to ppt said that she got some a couple of weeks ago. The problem has gone away.  Does not need a refill.

## 2010-07-22 ENCOUNTER — Other Ambulatory Visit: Payer: Self-pay | Admitting: *Deleted

## 2010-07-22 DIAGNOSIS — B20 Human immunodeficiency virus [HIV] disease: Secondary | ICD-10-CM

## 2010-07-22 MED ORDER — EMTRICITABINE-TENOFOVIR DF 200-300 MG PO TABS: 1.0000 | ORAL_TABLET | Freq: Every day | ORAL | Status: DC

## 2010-07-27 ENCOUNTER — Other Ambulatory Visit: Payer: Self-pay | Admitting: *Deleted

## 2010-07-27 MED ORDER — PRAVASTATIN SODIUM 40 MG PO TABS: 40.0000 mg | ORAL_TABLET | Freq: Every day | ORAL | Status: DC

## 2010-07-27 NOTE — Telephone Encounter (Signed)
Pharmacy is requesting a 6 month supply if possible.

## 2010-07-30 ENCOUNTER — Ambulatory Visit: Payer: Medicaid Other

## 2010-08-02 LAB — T-HELPER CELL (CD4) - (RCID CLINIC ONLY)
CD4 % Helper T Cell: 23 % — ABNORMAL LOW (ref 33–55)
CD4 T Cell Abs: 490 uL (ref 400–2700)

## 2010-08-05 ENCOUNTER — Ambulatory Visit: Payer: Medicaid Other

## 2010-08-09 ENCOUNTER — Encounter: Payer: Self-pay | Admitting: Infectious Diseases

## 2010-08-09 ENCOUNTER — Ambulatory Visit (INDEPENDENT_AMBULATORY_CARE_PROVIDER_SITE_OTHER): Payer: Medicaid Other | Admitting: Infectious Diseases

## 2010-08-09 DIAGNOSIS — Z23 Encounter for immunization: Secondary | ICD-10-CM

## 2010-08-09 DIAGNOSIS — B1710 Acute hepatitis C without hepatic coma: Secondary | ICD-10-CM

## 2010-08-09 DIAGNOSIS — B20 Human immunodeficiency virus [HIV] disease: Secondary | ICD-10-CM

## 2010-08-09 DIAGNOSIS — B029 Zoster without complications: Secondary | ICD-10-CM

## 2010-08-09 LAB — T-HELPER CELL (CD4) - (RCID CLINIC ONLY): CD4 % Helper T Cell: 22 % — ABNORMAL LOW (ref 33–55)

## 2010-08-09 MED ORDER — HYDROCODONE-ACETAMINOPHEN 5-500 MG PO TABS: 1.0000 | ORAL_TABLET | Freq: Three times a day (TID) | ORAL | Status: AC | PRN

## 2010-08-09 NOTE — Progress Notes (Signed)
Addended by: Wendall Mola on: 08/09/2010 11:47 AM   Modules accepted: Orders

## 2010-08-09 NOTE — Assessment & Plan Note (Addendum)
Will give her trial of vicodin. She does not want to take "antidepressants or seizure medicines" . She is aware of addiction risk and is clear she does not want to take pain meds chronically. We discussed her risk of hepatitis flare. She states she is aware of si/sx of liver dysfunction.  She states she is unlikely to take more than 1/day.

## 2010-08-09 NOTE — Assessment & Plan Note (Signed)
Start Hep A series today

## 2010-08-09 NOTE — Progress Notes (Signed)
  Subjective:    [name], female    DOB: [birth year], 50 y.o.   MRN: [id number]  HPI 51 yo F with hx of Hep C and HIV. She had nasal septoplasty 01-05-10. Her last CD4 770 and VL <20 (05-25-10). Pap normal 05-20-10.Her breathing is much better since having septoplasty. Today complains of continued pain from her previous shingles. Has tried alleve, naproxen without improvement. She otherwise feels good, has lost some wt, BP "not that bad". Tries to walk.    Review of Systems     Objective:   Physical Exam  Constitutional: She appears well-developed and well-nourished.  Eyes: EOM are normal. Pupils are equal, round, and reactive to light.  Neck: Neck supple.  Cardiovascular: Normal rate, regular rhythm and normal heart sounds.   Pulmonary/Chest: Effort normal and breath sounds normal.  Abdominal: Soft. Bowel sounds are normal. There is no tenderness.  Skin:             Assessment & Plan:

## 2010-08-09 NOTE — Assessment & Plan Note (Signed)
She is doing very well. Offered condoms. Will cont her current meds. rtc 4-5 months

## 2010-08-17 ENCOUNTER — Ambulatory Visit
Admission: RE | Admit: 2010-08-17 | Discharge: 2010-08-17 | Disposition: A | Discharge status: Home / Self Care | Payer: Medicaid Other | Source: Ambulatory Visit | Attending: Infectious Diseases | Admitting: Infectious Diseases

## 2010-08-17 DIAGNOSIS — Z1231 Encounter for screening mammogram for malignant neoplasm of breast: Secondary | ICD-10-CM

## 2010-08-24 ENCOUNTER — Other Ambulatory Visit: Payer: Medicaid Other

## 2010-09-07 ENCOUNTER — Ambulatory Visit: Payer: Medicaid Other | Admitting: Infectious Diseases

## 2010-09-07 NOTE — Discharge Summary (Signed)
[name] [name], [name] [name]                 ACCOUNT NO.:  [id number]   MEDICAL RECORD NO.:  [id number]          PATIENT TYPE:  INP   LOCATION:  2014                         FACILITY:  [facility]   PHYSICIAN:  Alvester Morin, M.D.  DATE OF BIRTH:  [birth year]   DATE OF ADMISSION:  09/01/2007  DATE OF DISCHARGE:  09/02/2007                               DISCHARGE SUMMARY   PRIMARY CARE PHYSICIAN:  Dr. Tinnie Gens C. Hatcher.   DISCHARGE DIAGNOSES:  1. Chest pain secondary to cocaine abuse.  2. Right knee pain secondary to Baker cyst.  3. Polysubstance abuse.  4. Human immunodeficiency virus with CD4 count of 380.  5. History of hepatitis C virus.  6. Tobacco abuse.  7. Hypertension.  8. Hypokalemia.  9. History of depression.  10.History of insomnia.  11.History of diverticular disease.  12.Status post partial colonic resection for diverticular disease.   DISCHARGE MEDICATIONS:  1. Maxzide 25/37.5 one taken by mouth once daily.  2. Truvada 200-300 mg once daily.  3. Lexiva 700 mg once daily.  4. Truvada 200/300 mg once daily.  5. Norvir 100 mg once daily.  6. Albuterol MDI 2 puffs q.6 h. p.r.n.  7. Zegerid 40/1680 once daily.   Please note that the patient's atenolol and HCTZ have been discontinued  on account of cocaine abuse and hypokalemia.   DISPOSITION AND FOLLOWUP:  The patient is sent home in a stable  condition.  The patient will follow up with Dr. Ninetta Lights at the [facility].  She has got a followup  appointment set up for Sep 05, 2007.  At the followup visit, the patient  will be reviewed for resolution of her chest pain and a BMET examination  is advised to check her potassium, also her primary care needs in terms  of her HIV will be addressed at the followup visit.   STUDIES:  1. CT angio of chest Sep 01, 2007, normal examination.  2. X-ray right knee Sep 02, 2007, negative examination.  3. Doppler extremities Sep 01, 2007, examination  negative for DVT,      fluid-filled cyst in the region of right popliteal fossa.   CONSULTS:  No special consults were requested during this hospital  admission.   BRIEF ADMISSION HISTORY AND PHYSICAL:  [name] [name] is a 50 year old woman  with multiple medical problems including HIV, hepatitis C, hypertension,  tobacco abuse.  She presented to the [facility] ED with 2 complaints  namely chest pain and leg pain.   Her chest pain started 1 day prior to admission while she was watching  TV.  It was a sudden onset sharp chest pain, 10/10 in severity, located  on her right sternal border.  It radiated to her neck and jaws on the  same side.  It was associated with shortness of breath and nausea.  It  lasted about 2 minutes, after which it subsided on its own.  She has had  multiple such episodes since this episode.  She gave a history of  similar pains off and on for the past  2 years but of less severity.  There is no definite relationship of her pain to exertion or meals.   Her leg pain is completely new.  It is a constant pain that had been  going on for the past 4 days prior to admission.  It is located behind  her right knee and radiates to involve her entire knee.  She felt that  her pain is worse with each episode of chest pain, and she thought that  2 are related.  She was unable to bear weight and move her right leg  because of the pain.  She denied any fever, trauma, swelling, prolonged  immobility.   PAST MEDICAL HISTORY:  DVT, PE, cough, hemoptysis, or bowel or bladder  disturbance.   PHYSICAL EXAMINATION:  ADMISSION VITALS:  Temperature 98.5, blood  pressure 129/88, pulse 74, respiratory rate 18, O2 sat 97% on room air.  GENERAL:  NAD.  EYES:  EOMI, PERRL, no icterus, no pallor.  ENT:  Moist mucous membranes.  Oropharynx clear.  NECK:  Supple.  No JVD.  CHEST:  Good air entry bilaterally, bilaterally clear to auscultation.  CARDIOVASCULAR:  Regular rate and rhythm.  Normal  heart sounds.  No  murmur, rubs, or gallops.  ABDOMEN:  Soft, nontender, nondistended.  Positive bowel sounds.  EXTREMITIES:  No edema.  No swelling.  GENITOURINARY:  No CVA tenderness.  SKIN:  No rash.  Normal turgor.  LYMPH:  No lymphadenopathy.  MUSCULOSKELETAL:  No joint or spine tenderness or swelling.  Her knees  examinations were completely benign.  NEURO:  Alert and oriented x3.  Cranial nerves II-XII intact.  No focal  motor or sensory deficit.  PSYCH:  Appropriate.   ADMISSION LABORATORY DATA:  Sodium 138, potassium 3.1, chloride 102, BUN  8, creatinine 0.8, glucose 90.  INRs, calcium 1.08, bilirubin 0.7, alk  phos 56, ALT 28, AST 20, protein 7.9, albumin 3.1, calcium 8.3, D-dimer  0.51, hemoglobin 12.4, MCV 82.4, white cells 5.9, ANC 2.9, platelet 164.  Cardiac enzymes, troponin 0.01 and less than 0.05.  Urine drug screen,  positive for cocaine, opiate, and marijuana.  Urinalysis, specific  gravity 1.046, small leukocytes, wbc 3-6, bacteria few.   HOSPITAL COURSE:  1. Chest pain.  Given the risk factors of polysubstance abuse,      smoking, and hypertension as well as positive family history, the      patient was placed in for observation and further workup.  Her      cardiac enzymes were cycled and repeat EKGs were obtained, was      placed on aspirin, nitrate as well as p.r.n. pain meds, and      monitored her closely.  She also got a CT pulmonary angiogram as      part of initial workup in the ED as mentioned above, the results      all turned out to be negative, and she did not have any further      episodes of chest pain in the hospital.  As mentioned above, her      UDS was positive for cocaine so most likely it was related to      cocaine abuse.  2. Right knee pain.  This was most likely secondary to a Baker cyst as      seen on the Doppler ultrasound done in the ED.  The patient was      simply placed on p.r.n. meds.  She did not have any further  exacerbation of her knee pain in the hospital.  3. Hypokalemia.  The patient's potassium was repleted and her HCTZ was      changed to Maxzide.  The patient is known to have hypokalemia in      the past and is being worked up on an outpatient basis.  4. HIV.  We continued the patient's HIV medications.  Since her CD4      count was 380, she did not need any prophylaxis.  5. HCV infection.  Her recent liver function tests have been within      normal limits.  The possibility of chronic HCV infection can be      investigated on outpatient basis.  6. Tobacco abuse.  Smoking cessation counseling was placed, and the      patient was offered p.r.n. nicotine patch.  7. Hypertension.  As mentioned above, the patient's atenolol was      discontinued because of cocaine abuse and also HCTZ was changed to      Maxzide.  8. Polysubstance abuse.  The patient was counseled on polysubstance      abuse, and he is advised to stay off cocaine, marijuana as well as      opiates.   DISCHARGE LABORATORY DATA:  White cell 5.9, hemoglobin 11.4, MCV 81.8,  platelet count 177, sodium 136, potassium 3.1, chloride 102, bicarbonate  25, glucose 145, BUN 5, creatinine 0.64.   DISCHARGE VITALS:  Temperature 97.1, pulse 72, respiratory rate 18,  blood pressure 131/83, oxygen saturation 96% on room air.   The patient is sent home in stable condition.  At the time of discharge,  the patient was no longer complaining of any chest pain, and her right  knee pain had resolved.      Zara Council, MD  Electronically Signed      Alvester Morin, M.D.  Electronically Signed    AS/MEDQ  D:  09/04/2007  T:  09/05/2007  Job:  [id number]   cc:   Lacretia Leigh. Ninetta Lights, M.D.

## 2010-09-07 NOTE — Discharge Summary (Signed)
[name] [name], [name] [name]                 ACCOUNT NO.:  [id number]   MEDICAL RECORD NO.:  [id number]          PATIENT TYPE:  INP   LOCATION:  1914                         FACILITY:  [facility]   PHYSICIAN:  Alvester Morin, M.D.  DATE OF BIRTH:  1960-09-24   DATE OF ADMISSION:  03/26/2008  DATE OF DISCHARGE:  03/31/2008                               DISCHARGE SUMMARY   DISCHARGE DIAGNOSES   1. Community-acquired pneumonia, streptococcal, in the setting of HIV      with CD-4 count 190.  2. Diarrhea in the setting of HIV CD-4 count 190, likely infectious vs      HIV associated, resolved.  3. Hypokalemia with a potassium of 2.9 on admission, resolved.  4. Human immunodeficiency virus with CD-4 count of 190 (March 24, 2008).  5. Hypertension.  6. History of hepatitis C.  7. Gastroesophageal reflux disease.  8. Polysubstance abuse.  9. Depression.  10.Allergic rhinitis.  11.Tobacco abuse, ongoing.  12.History of diverticular disease, status post partial colonic      resection for diverticular disease.   DISCHARGE MEDICATIONS   1. Truvada 1 tablet once daily.  2. Ferrous sulfate 325 mg 1 pill 3 times daily.  3. Lexiva 700 mg 2 pills once daily.  4. Tussionex 5 mL 2 times daily, for cough.  5. Avelox 400 mg 1 pill once daily for 5 more days.  6. Norvir 100 mg 2 pills once daily.  7. Claritin 10 mg 1 pill once daily.  8. Albuterol 90 mcg 2 puffs every 4 hours as needed for shortness of      breath.  9. Lasix 20 mg 1 pill once daily.  10.Amlodipine 5 mg 1 pill once daily.  11.Protonix 1 pill once daily.  12.Atrovent HFA 17 mcg MDI 2 puffs every 6 hours.  13.Bactrim DS 1 pill 3 times a week.  14.Prednisone 60 mg for 1 day, 50 mg once daily for 3 days, 40 mg once      daily for 3 days, 30 mg for 3 days, 20 mg once daily for 3 days,      and 10 mg once daily for 3 more days.   The patient is advised to stop Lasix, whenever she has diarrhea.   DISPOSITION AND FOLLOWUP   The  patient is to follow up with her primary care physician Dr. Ninetta Lights  in the outpatient clinic at [facility] on April 09, 2008, at  10:30 a.m.  The patient is to get basic metabolic panel at the time of  hospital followup and her potassium needs to be monitored.  We started  her on Bactrim for diarrhea, Dr. Ninetta Lights is to assess the duration of  treatment with Bactrim.  As her CD-4 count dropped from May 2009 (380)  to November 2009 (190), Dr. Ninetta Lights will reassess her HIV medications.   PROCEDURES   1. Chest x-ray, March 26, 2008; impression, hypoventilation.  Mild      bilateral airspace disease which may be due to pneumonia.  2. Chest x-ray, March 27, 2008; impression, patchy  bilateral      airspace infiltrates consistent with pneumonia.  Interval worsening      in the superior segment of the left upper lobe, and the anterior      aspect of the right upper lobe.  3. Cardiac enzymes, point-of-care markers, CK-MB less than 1.0,      troponin I less than 0.05, myoglobin 89.6.  4. Basic metabolic panel:  Sodium 136, potassium 2.8, chloride 102,      carbon dioxide 22, glucose 91, BUN 7, creatinine 0.66, and calcium      8.4.  5. Urine pregnancy test negative.  Urinalysis is negative except for      trace leukocytes.  Urine drug screen is positive for      tetrahydrocannabinol.  LDH 173.  Urine streptococcal antigen      positive.  6. Fecal occult blood test negative.  Clostridium difficile toxin      negative.  Urine Legionella antigen negative.   CONSULTATIONS  None.   BRIEF ADMITTING HISTORY AND PHYSICAL EXAMINATION   [name] [name] is a 50 year old lady with past medical history of HIV with  CD4 count of 190 (March 24, 2008), history of hepatitis C,  hypertension, depression, gastroesophageal reflux disease, tobacco  abuse, who comes to the [facility] Emergency Department as she was  referred by Dr. Ninetta Lights as she was found to have a potassium of 2.4 in  the  clinic.  The patient has been complaining of cold, chills, mild  fevers of 99-100 degrees Fahrenheit; cough, occasionally productive with  mucoid sputum; body aches; nausea for the last 6-7 days.  She was seen  in the clinic on March 24, 2008, by Dr. Ninetta Lights and was started on  Tamiflu for 5 days.  Her labs on that day showed a potassium of 2.7 and  subsequently Dr. Ninetta Lights treated her with potassium and was asked to  repeat basic metabolic panel on March 26, 2008. On Dec 2nd, She was  found to have a potassium of 2.4 today and was referred to [facility]  ED.  Apparently, the patient's niece whom she lives with, has similar  complaints 2 days prior to her complaints.  She complains of diarrhea,  loose watery stools, not unusual for her as she gets diarrhea on and off  for nearly 5 years.  She denies any other complaints.   PHYSICAL EXAMINATION   VITAL SIGNS:  She has a temperature of 98.3, blood pressure 126/85,  pulse rate 95, respiratory rate 16, and oxygen saturation 98% on room  air.  GENERAL:  She was not in any acute distress.  EYES:  Pupils are equal, round, and reactive to light.  Extraocular  movements intact.  ENT:  She had a nasal voice, breathing through facial mask.  Moist  mucous membranes.  No thrush.  NECK:  Supple.  No lymphadenopathy.  No carotid bruits.  RESPIRATORY:  Good air movement bilaterally.  Occasional wheezes  bilaterally.  Faint rhonchi bilaterally.  No crackles.  CARDIOVASCULAR:  S1 and S2.  Regular in rate and rhythm.  No murmurs.  No rubs.  No gallops.  ABDOMEN:  Soft, nondistended, and nontender.  No guarding.  No rigidity.  Bowel sounds positive.  EXTREMITIES:  No pedal edema.  SKIN:  No lesions.  No rashes.  LYMPH:  No palpable lymphadenopathy.  MUSCULOSKELETAL:  Moves all 4 extremities.  NEUROLOGIC:  Nonfocal.  PSYCH:  Appropriate.   LABORATORY DATA  1. Basic metabolic panel:  Sodium 139, potassium 2.9,  chloride 104,      bicarb 24, BUN  7, creatinine 0.8, blood sugar 93, and magnesium      2.0.  2. Complete blood count:  White count 4.8, hemoglobin 12.7, hematocrit      38.8, and platelet count 130.   HOSPITAL COURSE BY PROBLEM   1. Community-acquired pneumonia.  Given her history of cold-like      illness, history of sick contacts, fevers, shortness of breath, and      cough, upper respiratory tract infection superimposed by community-      acquired pneumonia is more likely.  We started her on IV Avelox and      completed her full course of Tamiflu, which was started by Dr.      Ninetta Lights in the outpatient clinic on March 24, 2008.  Chest x-ray      was suggestive of pneumonia and urine streptococcal antigen is      positive.  Gradually, we changed her IV antibiotics to oral Avelox      and is to continue 10 days' course of antibiotics.  The patient      clinically got better on the second day, but had diffuse wheezing      throughout her lung fields.  Although she is not labeled a      diagnosis of COPD, we thought that she has some questionable      element of COPD and decided to start her on prednisone taper for      which she responded excellently.  The patient is to finish the      prednisone taper course as mentioned in discharge medication.   1. Diarrhea, in the setting of HIV with CD4 count 190.  As per the      patient, she has been having chronic on and off diarrhea for nearly      5 years. As her diarrhea was contributing to her hypokalemia, we      decided to treat her.  Our team discussed with Dr. Ninetta Lights and he      advised to start her on Bactrim 3 times a week.  Her diarrhea      responded with the first dose of Bactrim.  The patient is advised      to continue Bactrim 3 times a week and is to follow up with Dr.      Ninetta Lights.  Dr. Ninetta Lights is to assess the duration of treatment.   1. Hypokalemia, likely secondary to combination of diarrhea, Lasix,      albuterol inhalers.  She had a potassium of 2.9  at the time of      admission.  We held her Lasix, but continued her albuterol.  She      continued to have diarrhea and hypokalemia during the first and      second days of hospital admission and we repleted her potassium as      needed.  After starting on Bactrim for diarrhea, her diarrhea      resolved and her hypokalemia also resolved.  Her potassium on the      day of discharge is 4.6.  The patient is to continue all her      medications including Lasix and is to follow up with Dr. Ninetta Lights in      the outpatient clinic on April 09, 2008, and is to get basic      metabolic panel at the time of hospital followup.  We advised the  patient not to take Lasix whenever she gets these diarrhea      episodes.   1. Hypertension.  Her blood pressures during the entire hospital stay      were fairly well controlled and we continued all of her medications      except Lasix 20 mg.  2. HIV with CD4 count 190.  We continued all of her home medications.      The patient is to follow up with Dr. Ninetta Lights in the outpatient      clinic, who is going to reassess her HIV medications.   DISCHARGE VITALS  Temperature 98.0, blood pressure 131/88, pulse rate 88, respiratory rate  20, and oxygen saturation 97% on room air.   DISCHARGE LABS  Sodium 133, potassium 4.6, chloride 102, bicarb 25, BUN 12, creatinine  0.68, blood sugar 93, calcium 8.8.   On the day of discharge, the patient is stable, alert and oriented, and  is not in any acute distress.  The patient is to follow up with Dr.  Ninetta Lights in the outpatient clinic on April 09, 2008, at 10:30 a.m. and  is to get the BMP to monitor her potassium.      Blondell Reveal, MD  Electronically Signed      Alvester Morin, M.D.  Electronically Signed    VB/MEDQ  D:  04/01/2008  T:  04/02/2008  Job:  [id number]   cc:   [facility]  [place] C. Ninetta Lights, M.D.

## 2010-09-07 NOTE — Assessment & Plan Note (Signed)
[facility]                         GASTROENTEROLOGY OFFICE NOTE   NAME:[name] [name] FRUCHTER                        MRN:          [id number]  DATE:01/12/2007                            DOB:          1961/02/03    PROBLEM:  Abdominal pain.   REASON:  [name] [name] has returned for re-evaluation.  Over the last 2  months, she has been complaining of immediate post prandial, moderately  severe burning and sharp upper abdominal pain.  The pain may last up to  an hour or two.  It is without radiation.  She takes Protonix twice a  day.  She is on no gastric irritants including nonsteroidals.  She is  without dysphagia or odynophagia.  Endoscopy in August 2007, demonstrated inflammatory gastric polyps and a  hiatal hernia.  She is without nausea or pyrosis.  She is HIV positive.  Colonoscopy in August 2007, was unremarkable.  She is status post  partial colonic resection for diverticular disease.   MEDICATIONS:  Norvir, Lexiva, Zyrtec, Protonix b.i.d., Lomotil p.r.n.,  Truvada, Reglan, HCTZ, and atenolol.   ALLERGIES:  1. TYLENOL.  2. THIAZIDE.   PHYSICAL EXAMINATION:  VITAL SIGNS:  Pulse 64, blood pressure 116/76,  weight 138.  ABDOMEN:  She has mild mid epigastric tenderness to palpation without  guarding or rebound.  There were no abdominal masses or organomegaly.   IMPRESSION:  Persistent post prandial upper abdominal pain.  This could  be due to ulcer and non-ulcerative dyspepsia.  Medications could be  causing her symptoms.   RECOMMENDATIONS:  1. Discontinue Protonix.  2. Trial of Zegerid 40 mg daily.  3. NuLev 0.25 mg p.r.n. pain.  4. Upper endoscopy.     Barbette Hair. Arlyce Dice, MD,FACG  Electronically Signed    RDK/MedQ  DD: 01/12/2007  DT: 01/12/2007  Job #: [id number]   cc:   Noralyn Pick. Eden Emms, MD, [facility]

## 2010-09-08 ENCOUNTER — Ambulatory Visit: Payer: Medicaid Other | Admitting: Infectious Diseases

## 2010-09-10 NOTE — Discharge Summary (Signed)
[facility]  Patient:    [name] [name], [name] [name] Visit Number: [id number] MRN: [id number]          Service Type: SUR Location: 4W 0482 01 Attending Physician:  Shelly Rubenstein Dictated by:   Abigail Miyamoto, M.D. Admit Date:  06/14/2001 Discharge Date: 06/22/2001                             Discharge Summary  HISTORY OF PRESENT ILLNESS:  Ms. Cullins is a 50 year old female who was admitted for elective colostomy takedown and colon reanastomosis, and she is HIV and hepatitis-positive.  HOSPITAL COURSE:  The patient was admitted on June 14, 2001, and taken to the operating room, where she underwent a colostomy takedown and colon reanastomosis.  She was taken then in stable condition to a regular surgical floor.  Postoperatively she was followed the next several days with bowel rest.  She had an uneventful recovery from this point and was quickly started on sips, which were then advanced.  By postoperative day 5 she was mildly nauseated but was having bowel movements, so we kept her on clear liquids.  by postoperative day 7 she was tolerating a better died.  She did have a yeast infection and was given a dose of Diflucan for this.  On postoperative day 8 she was tolerating a regular diet, having normal bowel movements, and tolerating oral pain medications, and a decision was made to discharge the patient home.  DISCHARGE DIAGNOSIS: 1. Colostomy, status post colostomy takedown and colon reanastomosis. 2. Hepatitis. 3. Human immunodeficiency virus-positive.  DISCHARGE DIET:  Regular.  DISCHARGE ACTIVITY:  As tolerated.  She is to do no heavy lifting. She may shower.  DISCHARGE MEDICATIONS:  She will resume all medications.  DISCHARGE FOLLOW-UP:  She will follow up in my office in one week post discharge. Dictated by:   Abigail Miyamoto, M.D. Attending Physician:  Shelly Rubenstein DD:  07/06/01 TD:  07/09/01 Job: [id number]

## 2010-09-10 NOTE — H&P (Signed)
[facility]  Patient:    [name] [name], DESCOTEAUX Visit Number: [id number] MRN: [id number]          Service Type: SUR Location: 4W 0482 01 Attending Physician:  Shelly Rubenstein Dictated by:   Abigail Miyamoto, M.D. Admit Date:  06/14/2001 Discharge Date: 06/22/2001                           History and Physical  HISTORY:  Ms. Eble is a 49 year old female who is HIV and hepatitis positive, who presented in October of 2002 with perforated sigmoid diverticulitis and underwent an emergency operation and colostomy at that time.  She now presents for elective colon resection.  She has no complaints.  PAST MEDICAL HISTORY:  Significant for HIV for which she is on multiple medications.  PHYSICAL EXAMINATION:  GENERAL:  A thin female in no acute distress.  HEENT:  Sclerae anicteric.  Oropharynx is clear.  LUNGS:  Clear to auscultation bilaterally.  NECK:  Supple.  CV:  Regular rate and rhythm.  ABDOMEN:  Soft.  There is a well healed midline incision and a pink well perfused ostomy.  EXTREMITIES:  Warm and well perfused.  IMPRESSION:  Patient with a colostomy who now presents for colostomy take-down and colon reanastomosis.  Risks of surgery including bleeding, infection, anastomotic breakdown, etcetera were discussed.  At this point, she wishes to proceed and surgery is thus planned. Dictated by:   Abigail Miyamoto, M.D. Attending Physician:  Shelly Rubenstein DD:  07/06/01 TD:  07/07/01 Job: [id number]
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
## 2010-09-10 NOTE — Assessment & Plan Note (Signed)
[facility]                           GASTROENTEROLOGY OFFICE NOTE   NAME:Steffenhagen, QIANA LANDGREBE                        MRN:          [id number]  DATE:12/21/2005                            DOB:          03-13-1961    Meral came in just to ask me some questions about her procedures.  I told her  about the colonoscopy being very relatively normal, except for the  postoperative changes, that she had a 3-cm inguinal hernia, some mild  esophagitis and somewhat spastic pyloric channel. The polyp is really benign  by pathology.  CT scan that was done previously never did not really show  anything.  The colon biopsies were negative.  I told her to continue on her  medications that she is presently taking, which includes Norvir, Lexiva,  hydrochlorothiazide, Zofran, Zyrtec, atenolol, Protonix, __________  and  atropine and Reglan 5 mg q.8 h.  I gave her a bunch of samples of PPIs to  take and see which one she liked, told her to let us know which one she  preferred, and told us to call back if she has any further difficulty.   IMPRESSION:  1. Gastroesophageal reflux disease controlled with PPI.  2. History of HIV.  3. Status post large resection of her colon for diverticulitis.  4. Status post tonsillectomy and hand surgery.   RECOMMENDATIONS:  As above, and recommended to let us know if she has any  further difficulty.                                   Ulyess Mort, MD   SML/MedQ  DD:  12/21/2005  DT:  12/22/2005  Job #:  [id number]

## 2010-09-10 NOTE — Assessment & Plan Note (Signed)
[facility]                           GASTROENTEROLOGY OFFICE NOTE   NAME:[name] [name] ROCKFORD                        MRN:          [id number]  DATE:11/07/2005                            DOB:          [birth year]    The patient comes in, she takes Norvasc, Lexiva, hydrochlorothiazide,  Zofran, Zyrtec, Atenolol, Protonix, Lomotil, Reglan 10 mg q.8h.   Patient comes in referred by Dr. Eden [name] for chest pain and dizziness.  Says  she has been treated with omeprazole but it is not working.  Has heartburn,  indigestion, nausea, vomiting, chest pain for two months intermittently.  She indicates it is not cardiac.  She has been worked up for this.  Says is  on exertion however and this is what brings it on for the most part.  She  does have known HIV.  She is taking Protonix for that and has had success as  well as Prilosec and Nexium.  Says she has lots of reflux.  Had an EGD  previously, Dr. Magnus [name], she was operated on for diverticulitis five years  ago.  She has had a colostomy bag.  She does have some diarrhea, she says  due to her medication.   PHYSICAL EXAMINATION:  She is 5 feet 1 inch.  Weighs 148.  Blood pressure  126/71.  Pulse 76 and regular.  HEENT:  Oropharynx is negative.  NECK:  Negative.  HEART:  Revealed a regular rhythm.  There was a grade 2/6 systolic ejection  murmur.  CHEST:  Clear.  ABDOMEN:  Soft, no organomegaly.  Non-tender.  No bruits or rubs.   IMPRESSION:  1.  Chest pain on exertion, not cardiac.  Somewhat atypical.  2.  Gastroesophageal Reflux Disease.  3.  History of questionable polyps and diverticulitis.  4.  Human immunodeficiency virus.  5.  Mild obesity.   RECOMMENDATIONS:  1.  Put her on Protonix.  Gave her samples.  2.  Schedule her for an endo with colonoscopic examination sometime in the      near future.  3.  In the meantime she is to continue her other medications.                                   [name] Mort, MD   SML/MedQ  DD:  11/07/2005  DT:  11/08/2005  Job #:  [id number]

## 2010-09-10 NOTE — Consult Note (Signed)
[place]. [facility]  Patient:    [name] [name], [name] [name]                          MRN: [id number] Proc. Date: 04/28/00 Attending:  Florencia Reasons, M.D. CC:         Felton Clinton, M.D.   Consultation Report  REASON FOR CONSULTATION:  The medicine teaching service asked me to see this 50 year old African-American female because of marked elevation of liver chemistries.  HISTORY OF PRESENT ILLNESS:  The patient was admitted to the hospital earlier today because of nausea, vomiting, myalgias, dizziness, and right upper quadrant abdominal pain of approximately two days duration.  On admission, she was found to have marked elevation of liver chemistries compared to a month earlier with her AST having risen from 47 to 9000, her SGPT having risen from 37 to 4500, and her platelets falling from 151,000 to 92,000 during this period of time.  Her bilirubin has risen from 0.5 to 5.8 and her alkaline phosphatase from 59 to 185.  An ultrasound obtained today was normal.  The patient took eight Tylenol tablets over the course of a day about a week ago, but otherwise, there has been no evident Tylenol exposure.  The patient is on at least one potentially hepatotoxic medication as an outpatient (stavudine).  The patient carries a history of hepatitis B, hepatitis C, and HIV.  The patients CD4 count has been adequate when checked in the past, but her current hepatitis serologic status is unknown.  ALLERGIES:  DYAZIDE (pruritus).  OUTPATIENT MEDICATIONS:  Prilosec, Zerit (stavudine), Viracept, Epivir, albuterol, atenolol, p.r.n. Tylenol, Depo-Provera, K-Dur, and hydrochlorothiazide.  PAST MEDICAL HISTORY:  Medical illnesses:  HIV, hypertension, GERD, history of depression, and bronchitis.  SOCIAL HISTORY:  Habits:  The patient "seldom" uses alcohol.  Last used crack cocaine a couple of years ago and has a remote history of IV drug use.  The patient is single and lives  with her boyfriend.  FAMILY HISTORY:  Positive for diabetes and hypertension but negative for liver disease per the chart.  REVIEW OF SYSTEMS:  Not otherwise obtained.  The right upper quadrant pain has decreased since admission.  PHYSICAL EXAMINATION:  GENERAL:  Pertinent for the absence of any dramatic findings.  The patient really is not frankly icteric as of this time.  MENTAL STATUS:  She is not somnolent, but rather awake, alert, coherent, and appropriate.  She is able to give todays date with just minimal hesitation. She did, however, stumble on her serial 3s, making errors after the first couple of calculations.  There is no asterixis.  ABDOMEN:  Liver span by scratch test is probably slightly reduced, maybe 8 cm. There is some subjective right upper quadrant tenderness.  The liver and spleen are nonpalpable.  There is no definitive ascites with some flank tympany present to percussion.  LABORATORY DATA:  Please see above.  Note that the patients prothrombin time is markedly elevated at 50 seconds with an INR of 11.4.  This may be artifactual since her PTT was markedly elevated as well.  Nonetheless, the patients glucose was diminished on admission at 34, suggesting marked hepatocellular dysfunction.  IMPRESSION:  Severe hepatocellular dysfunction, most likely as a result of medication (stavudine being the most likely candidate), although an activation of delta hepatitis or co-infection with acute hepatitis A, superimposed on chronic hepatitis, would be other possibilities.  So far, there are not evident complications from her  liver disease, such as encephalopathy or bleeding.  The patient is not a candidate for liver transplantation.  I suspect that her abdominal pain, which is improving, is related to hepatocapsular stretching.  The patients prognosis is very guarded; the next 24 to 48 hours will probably show whether the patient is progressing to fulminant hepatic  failure from submassive or massive hepatic necrosis or is having spontaneous resolution of the hepatitis.  RECOMMENDATIONS: 1. I agree with the current management, specifically holding the stavudine,    observing the patient clinically, administering vitamin K, monitoring her    labs, and monitoring her mental status. 2. I agree with updating her hepatitis serologies, including checking for    delta hepatitis. 3. I am afraid that specific management for her liver dysfunction is not    available and that we are, in effect, reduced to simply watching and    waiting and hoping that the patient will do well.  She looks grossly quite    well at this time, so hopefully, things will settle down and she will not    progress to more severe liver dysfunction with the attendant complications    of frank liver failure, such as encephalopathy, coma, or perhaps GI    bleeding.  I appreciate the opportunity to have seen this patient in consultation with you. DD:  04/28/00 TD:  04/29/00 Job: [id number]

## 2010-09-10 NOTE — Assessment & Plan Note (Signed)
[facility]                            CARDIOLOGY OFFICE NOTE   NAME:[name] [name], [name] [name]                        MRN:          [id number]  DATE:04/13/2006                            DOB:          [birth year]    [name] returns today for followup.  She is 50 years old. She has  HIV.  She had pneumocystis more than 5 years ago, but otherwise has been  doing excellent.  I have seen her in the past for atypical chest pain  with a normal Myoview.  She has had hypertension treated with  hydrochlorothiazide and atenolol.  However, lately she has been having  some dizziness, particularly in the morning, and she attributes it to  her hydrochlorothiazide.  In looking over the patient's records, her  weight has been stable.  Her blood pressure has been very well  controlled and at least in the last 3 occasions here in the office her  systolics have only been 120 and she does not have significant lower  extremity edema.  She is on a multitude of antiretroviral therapies and  I think that this bothers her stomach from time to time.  She has not  had any recurrent chest pain.   REVIEW OF SYSTEMS:  Otherwise remarkable for improved abdominal symptoms  .  Earlier in the year she had an abdominal CT scan to rule out  diverticulitis, which she has had in the past.   MEDICATIONS:  Include:  1. Norvir 200 mg daily.  2. Lexiva 700 mg 2 tablets daily.  3. Hydrochlorothiazide 25 daily.  4. Zofran 4 daily.  5. Zyrtec.  6. Atenolol 100 daily.  7. Protonix 40 b.i.d.  8. Diphenoxyla/atropine p.r.n.  9. Truvada 200 to 300 mg daily.  10.Reglan.   EXAMINATION:  She looks well.  Blood pressure is 118/78 and drops to  about 115/78 on standing.  Pulse is 61 and regular.  Weight is 148,  which is stable.  There is no lymphadenopathy, no thyromegaly.  HEENT:  Is normal.  LUNGS:  Are clear.  There is an S1, S2.  Distant heart sounds.  ABDOMEN:  Benign.  LOWER EXTREMITIES:   Intact pulses, no edema.   IMPRESSION:  Hypertension.  For some reason, the patient does not seem  to require the current dosages of her medications.  I told her to cut  her hydrochlorothiazide in half and her atenolol as well.  At some point  I suspect we will be able to stop the hydrochlorothiazide totally.  I  will see her back in 4 weeks to reassess her blood pressure and symptoms  of dizziness.   She has done very well in regards to her diagnosis of HIV.  She has not  had a second AIDS-defining diagnosis in over 5 years, has not been in  the hospital and I suspect her CD4 counts are quite elevated.  She has  had atypical pain in the past and she does not need another Myoview  currently.   Overall, I think that her symptoms are related to over medication in  regards to her atenolol and hydrochlorothiazide and should improve.  I  will see her back in 4 weeks time.     Noralyn Pick. Eden Emms, MD, [facility]  Electronically Signed    PCN/MedQ  DD: 04/13/2006  DT: 04/13/2006  Job #: [id number]

## 2010-09-15 ENCOUNTER — Other Ambulatory Visit: Payer: Self-pay | Admitting: *Deleted

## 2010-09-15 MED ORDER — HYDROCHLOROTHIAZIDE 25 MG PO TABS: 25.0000 mg | ORAL_TABLET | Freq: Every day | ORAL | Status: DC

## 2010-09-16 ENCOUNTER — Other Ambulatory Visit: Payer: Self-pay | Admitting: *Deleted

## 2010-09-17 ENCOUNTER — Other Ambulatory Visit: Payer: Self-pay | Admitting: Infectious Diseases

## 2010-09-17 DIAGNOSIS — J302 Other seasonal allergic rhinitis: Secondary | ICD-10-CM

## 2010-09-17 DIAGNOSIS — B20 Human immunodeficiency virus [HIV] disease: Secondary | ICD-10-CM

## 2010-09-17 MED ORDER — AMLODIPINE BESYLATE 10 MG PO TABS: 10.0000 mg | ORAL_TABLET | Freq: Every day | ORAL | Status: DC

## 2010-09-21 ENCOUNTER — Other Ambulatory Visit: Payer: Self-pay | Admitting: *Deleted

## 2010-09-21 MED ORDER — POTASSIUM CHLORIDE 20 MEQ PO PACK: 20.0000 meq | PACK | Freq: Two times a day (BID) | ORAL | Status: DC

## 2010-09-21 MED ORDER — NAPROXEN 500 MG PO TABS: 500.0000 mg | ORAL_TABLET | Freq: Two times a day (BID) | ORAL | Status: DC | PRN

## 2010-09-21 MED ORDER — MONTELUKAST SODIUM 10 MG PO TABS: 10.0000 mg | ORAL_TABLET | Freq: Every day | ORAL | Status: DC

## 2010-09-27 ENCOUNTER — Ambulatory Visit (INDEPENDENT_AMBULATORY_CARE_PROVIDER_SITE_OTHER): Payer: Medicaid Other | Admitting: Adult Health

## 2010-09-27 ENCOUNTER — Encounter: Payer: Self-pay | Admitting: Adult Health

## 2010-09-27 DIAGNOSIS — J309 Allergic rhinitis, unspecified: Secondary | ICD-10-CM

## 2010-09-27 DIAGNOSIS — J329 Chronic sinusitis, unspecified: Secondary | ICD-10-CM

## 2010-09-27 MED ORDER — BECLOMETHASONE DIPROP MONOHYD 42 MCG/SPRAY NA SUSP: 2.0000 | Freq: Two times a day (BID) | NASAL | Status: DC

## 2010-09-27 MED ORDER — AZITHROMYCIN 250 MG PO TABS: 250.0000 mg | ORAL_TABLET | Freq: Every day | ORAL | Status: AC

## 2010-09-27 NOTE — Progress Notes (Signed)
  Subjective:    [name], female    DOB: [birth year], 50 y.o.   MRN: [id number]  HPI Tiari presents to clinic today with a chief complaint of a five-day history of sinus congestion, runny nose, and draining sinuses. She endorses a recent visit to an ENT specialist, who put a new tube in her left TM and 2 days following this procedure. The symptoms developed. She denies fever, chills, sweats, shortness of breath, but does relate, worse, congestion than she had previously.   Review of Systems  Constitutional: Negative for fever, chills, diaphoresis and fatigue.  HENT: Positive for congestion, rhinorrhea, sneezing, postnasal drip, sinus pressure and tinnitus. Negative for hearing loss, ear pain, nosebleeds, sore throat, facial swelling, drooling, mouth sores, neck pain, neck stiffness, dental problem and ear discharge.   Eyes: Negative.   Respiratory: Negative for cough, shortness of breath and wheezing.   Cardiovascular: Negative.   Gastrointestinal: Negative.   Genitourinary: Negative.   Musculoskeletal: Negative.   Skin: Negative.   Neurological: Negative.   Hematological: Negative.   Psychiatric/Behavioral: Negative.        Objective:   Physical Exam  Constitutional: She is oriented to person, place, and time. She appears well-developed and well-nourished.       Uncomfortable appearing  HENT:  Head: Normocephalic and atraumatic.  Right Ear: External ear normal.       Drainage tube noted in left TM. Clean, intact, well secured, patent, with no drainage noted  Copious postnasal drainage noted in the posterior pharynx.  Eyes: Conjunctivae and EOM are normal. Pupils are equal, round, and reactive to light. Right eye exhibits no discharge. Left eye exhibits no discharge.  Neck: Normal range of motion. Neck supple. No thyromegaly present.  Cardiovascular: Normal rate and regular rhythm.   Pulmonary/Chest: Effort normal and breath sounds normal.  Abdominal: Bowel sounds  are normal.  Musculoskeletal: Normal range of motion.  Neurological: She is alert and oriented to person, place, and time.  Skin: Skin is warm and dry.  Psychiatric: She has a normal mood and affect. Her behavior is normal. Judgment and thought content normal.          Assessment & Plan:  1. Sinusitis with Allergic Rhinitis. We recommended she continue her current antihistamine therapy. We'll start the Z-Pak today and add Beconase nasal spray 2 sprays each nostril twice a day. Nasal humidification with normal saline nasal spray and humidification in the home was also recommended. She should followup with her ENT specialist at the next available appointment to determine if there is any problems with her surgical implant. Otherwise, she should followup with Dr. Ninetta Lights at her next scheduled appointment or return when necessary.  She verbally acknowledged this and agreed with plan of care.

## 2010-10-05 ENCOUNTER — Ambulatory Visit (INDEPENDENT_AMBULATORY_CARE_PROVIDER_SITE_OTHER): Payer: Medicaid Other | Admitting: Internal Medicine

## 2010-10-05 ENCOUNTER — Encounter: Payer: Self-pay | Admitting: Internal Medicine

## 2010-10-05 DIAGNOSIS — E785 Hyperlipidemia, unspecified: Secondary | ICD-10-CM

## 2010-10-05 DIAGNOSIS — M545 Low back pain, unspecified: Secondary | ICD-10-CM

## 2010-10-05 DIAGNOSIS — B029 Zoster without complications: Secondary | ICD-10-CM

## 2010-10-05 DIAGNOSIS — I10 Essential (primary) hypertension: Secondary | ICD-10-CM

## 2010-10-05 DIAGNOSIS — E876 Hypokalemia: Secondary | ICD-10-CM

## 2010-10-05 DIAGNOSIS — F1120 Opioid dependence, uncomplicated: Secondary | ICD-10-CM | POA: Insufficient documentation

## 2010-10-05 MED ORDER — TRAMADOL HCL 50 MG PO TABS: ORAL_TABLET | ORAL | Status: DC

## 2010-10-05 NOTE — Patient Instructions (Addendum)
Schedule a followup appointment with Dr. Arvilla Market on a Friday afternoon during the last 2 weeks of September or sooner if needed. I will be away for most of the month in August. If you have any concerns, questions or need to leave a message for me call the clinic at [phone] and say that you need to reach me or leave a message for me.  I will call you back as soon as I can. If you need any help or have questions after 5 PM on weekdays or over the weekend call the hospital operator at [phone] and ask to speak to the internal medicine resident on call. There is a physician available 24 hours a day if he need assistance. Continue to take all of her other medications as directed. Call me if you have any problems with the tramadol.

## 2010-10-05 NOTE — Assessment & Plan Note (Signed)
And continues to experience low back pain that is unchanged from her baseline.She does not have any fevers, chills, or neurological deficits suggestive of an underlying infectious or neurological process.  Not interested in narcotic medications including Vicodin and Percocet however wishes to resume pain control is tramadol. I believe this is very reasonable and will prescribe her tramadol today. I will have her followup in 3 months to assess her pain.  He is advised to return to the clinic sooner should she develop any concerning symptoms including fevers, chills, significant pain, or neurologic deficits.

## 2010-10-05 NOTE — Assessment & Plan Note (Addendum)
Ms/ Welp's blood pressure is stable and within acceptable limits. Will continue to monitor this closely.  He is due for a fasting lipid panel. I will have her return in the morning for fasting labs to include a comprehensive metabolic panel.

## 2010-10-05 NOTE — Assessment & Plan Note (Signed)
This is resolved. Patient is without evidence of a current outbreak. She continues to experience neuropathic pain. We discussed the nature and etiology of shingles and that her pain may persist over weeks or months. She expressed understanding of this. She is advised to continue to monitor her for recurrent outbreaks.

## 2010-10-05 NOTE — Progress Notes (Signed)
  Subjective:    [name], female    DOB: 1960/05/05, 50 y.o.   MRN: [id number]  HPI  [name] [name] is a delightful 50 year old female who presents today for a routine followup visit.  He has been having some problems with allergic sinusitis and recently had an tympanostomy tube in place in her left ear. She states that she is still feeling congested.  She has a follow up appointment with ENT on Friday (Dr. Ezzard Standing).  She is feeling better after taking the Z-Pak perscribed at her last visit at the [facility]; last dose was on Saturday.  She is continuing to use abx drops in her left ear.  She denies fevers, chills, purulent drainage from her ears, productive cough, or other concerning complaint.  Reports a recent outbreak of shingles for which she was prescribed a short course of Valtrex. She states this is now resolved however she continues to experience intermittent pain at the site of her shingles outbreak.  She continues to experience back pain. She is not interested in chronic pain therapy with her current medication such as Vicodin. She had previously taken Tylenol and would like to try that again. She denies any fevers, chills, tingling numbness or weakness in her extremities. She denies bowel or bladder incontinence as well as bowel or bladder retention.  Review of Systems    she denies chest pain, shortness of breath, dyspnea on exertion, syncope, headache, fevers, chills, nausea, vomiting, or other complaints. Full 12 point review of systems is obtained. Pertinent items noted above and in the history of present illness. All other systems reviewed are negative. Objective:   Physical Exam VItal signs reviewed and stable. GEN: No apparent distress.  Alert and oriented x 3.  Pleasant, conversant, and cooperative to exam. HEENT: head is autraumatic and normocephalic.  Neck is supple without palpable masses or lymphadenopathy.  No JVD or carotid bruits.  Vision intact.  EOMI.  PERRLA.   Sclerae anicteric.  Conjunctivae without pallor or injection. Mucous membranes are moist.  Oropharynx is without erythema, exudates, or other abnormal lesions.  Mucoid drainage is present in the posterior oropharynx. The right ear canal is slightly erythematous but otherwise clear and normal. The right TM appears normal without erythema, bulging, perforation, or air-fluid levels.  The left ear canal is clear. A tympanostomy tube is noted and appears appropriately placed.  Hearing remains diminished in the left ear. RESP:  Lungs are clear to ascultation bilaterally with good air movement.  No wheezes, ronchi, or rubs. CARDIOVASCULAR: regular rate, normal rhythm.  Clear S1, S2, no murmurs, gallops, or rubs. ABDOMEN: soft, non-tender, non-distended.  Bowels sounds present in all quadrants and normoactive.  No palpable masses. EXT: warm and dry.  Peripheral pulses equal, intact, and +2 globally.  No clubbing or cyanosis.  No edema in bilateral lower extremities. SKIN: warm and dry with normal turgor.  No rashes or abnormal lesions observed. NEURO: CN II-XII grossly intact.  Muscle strength +5/5 in bilateral upper and lower extremities.  Sensation is grossly intact.  No focal deficit.        Assessment & Plan:

## 2010-10-05 NOTE — Progress Notes (Signed)
Addended by: Bufford Spikes on: 10/05/2010 04:52 PM   Modules accepted: Orders

## 2010-10-05 NOTE — Assessment & Plan Note (Signed)
Patient is without symptoms of hypokalemia. Will check a comprehensive medical panel as well as a magnesium level with her fasting lab draw. She may benefit from daily oral repletion.

## 2010-10-12 ENCOUNTER — Other Ambulatory Visit: Payer: Self-pay | Admitting: Adult Health

## 2010-10-12 ENCOUNTER — Other Ambulatory Visit: Payer: Self-pay | Admitting: Infectious Diseases

## 2010-10-12 DIAGNOSIS — J302 Other seasonal allergic rhinitis: Secondary | ICD-10-CM

## 2010-10-20 ENCOUNTER — Other Ambulatory Visit: Payer: Self-pay | Admitting: *Deleted

## 2010-10-20 MED ORDER — NAPROXEN 500 MG PO TABS: 500.0000 mg | ORAL_TABLET | Freq: Two times a day (BID) | ORAL | Status: DC | PRN

## 2010-11-19 ENCOUNTER — Other Ambulatory Visit: Payer: Self-pay | Admitting: *Deleted

## 2010-11-19 MED ORDER — POTASSIUM CHLORIDE CRYS ER 20 MEQ PO TBCR: 20.0000 meq | EXTENDED_RELEASE_TABLET | Freq: Two times a day (BID) | ORAL | Status: DC

## 2010-11-19 NOTE — Telephone Encounter (Signed)
Pt requests tablets instead of powdwer; needs new rx.  Thanks

## 2010-11-19 NOTE — Telephone Encounter (Signed)
[name] [name],   Please verify that this patient needs this KCl script.  I refilled it but I do not understand it.

## 2010-11-22 ENCOUNTER — Telehealth: Payer: Self-pay | Admitting: *Deleted

## 2010-11-22 ENCOUNTER — Other Ambulatory Visit: Payer: Self-pay | Admitting: Internal Medicine

## 2010-11-22 DIAGNOSIS — E876 Hypokalemia: Secondary | ICD-10-CM

## 2010-11-22 NOTE — Telephone Encounter (Signed)
Thank you :)

## 2010-11-22 NOTE — Telephone Encounter (Signed)
Pt was called; lab appt scheduled for 11/24/10 as requested per Dr. Arvilla Market.

## 2010-11-22 NOTE — Telephone Encounter (Signed)
Message copied by Hassan Buckler on Mon Nov 22, 2010  9:12 AM ------      Message from: Nelda Bucks T      Created: Mon Nov 22, 2010  7:45 AM       Hi!  Could you call Ms. Kukuk and have her come in for a BMET and Mag?  Her K was low in 05/2010 and she has not had any repeat testing.  I will put in future orders for these tests.   Thanks!  -Belenda Cruise

## 2010-11-22 NOTE — Telephone Encounter (Signed)
Pt was called to schedule a lab appt per Dr. Arvilla Market.

## 2010-11-22 NOTE — Telephone Encounter (Signed)
She has a hx of kypokalemia and was started on KCl by ID; her last BMET shows a K of 3.1.  I believe she was to return for a repeat BMET at her last visit; I will confirm this.   Thanks!

## 2010-11-24 ENCOUNTER — Other Ambulatory Visit: Payer: Medicaid Other

## 2010-11-24 DIAGNOSIS — E785 Hyperlipidemia, unspecified: Secondary | ICD-10-CM

## 2010-11-24 DIAGNOSIS — E876 Hypokalemia: Secondary | ICD-10-CM

## 2010-11-24 DIAGNOSIS — I10 Essential (primary) hypertension: Secondary | ICD-10-CM

## 2010-11-24 LAB — COMPREHENSIVE METABOLIC PANEL
ALT: 26 U/L (ref 0–35)
Albumin: 4.7 g/dL (ref 3.5–5.2)
CO2: 26 mEq/L (ref 19–32)
Calcium: 9.9 mg/dL (ref 8.4–10.5)
Chloride: 97 mEq/L (ref 96–112)
Sodium: 136 mEq/L (ref 135–145)
Total Protein: 8.2 g/dL (ref 6.0–8.3)

## 2010-11-24 LAB — MAGNESIUM: Magnesium: 1.7 mg/dL (ref 1.5–2.5)

## 2010-11-30 ENCOUNTER — Other Ambulatory Visit: Payer: Self-pay | Admitting: Internal Medicine

## 2010-11-30 ENCOUNTER — Other Ambulatory Visit (INDEPENDENT_AMBULATORY_CARE_PROVIDER_SITE_OTHER): Payer: Medicaid Other

## 2010-11-30 ENCOUNTER — Telehealth: Payer: Self-pay | Admitting: Infectious Diseases

## 2010-11-30 DIAGNOSIS — E876 Hypokalemia: Secondary | ICD-10-CM

## 2010-11-30 LAB — BASIC METABOLIC PANEL
CO2: 28 mEq/L (ref 19–32)
Chloride: 100 mEq/L (ref 96–112)
Glucose, Bld: 114 mg/dL — ABNORMAL HIGH (ref 70–99)
Potassium: 2.8 mEq/L — ABNORMAL LOW (ref 3.5–5.3)
Sodium: 138 mEq/L (ref 135–145)

## 2010-11-30 NOTE — Telephone Encounter (Signed)
i called pt to speak with her about her K+ 2.9.  She is going to come back tomorrow for repeat. Has been taking K+ supplementation.

## 2010-11-30 NOTE — Telephone Encounter (Signed)
[name] [name] was found to have a recent potassium of 2.9.  She was told to present to clinic for assessment.  She feels well and is w/o complaints.  She has normal renal function and takes KCl  20 mEq PO BID already.  I encouraged her to double the dose to 40 mEq PO BID for the rest of the week and we would see her in clinic on Friday afternoon to reassess and repeat the BMET.  She was comfortable with that plan.

## 2010-12-03 ENCOUNTER — Ambulatory Visit (INDEPENDENT_AMBULATORY_CARE_PROVIDER_SITE_OTHER): Payer: Medicaid Other | Admitting: Internal Medicine

## 2010-12-03 ENCOUNTER — Encounter: Payer: Self-pay | Admitting: Internal Medicine

## 2010-12-03 VITALS — BP 139/82 | HR 82 | Temp 98.4°F | Ht 61.0 in | Wt 147.6 lb

## 2010-12-03 DIAGNOSIS — E876 Hypokalemia: Secondary | ICD-10-CM

## 2010-12-03 DIAGNOSIS — I10 Essential (primary) hypertension: Secondary | ICD-10-CM

## 2010-12-03 DIAGNOSIS — R05 Cough: Secondary | ICD-10-CM

## 2010-12-03 LAB — BASIC METABOLIC PANEL WITH GFR
CO2: 25 mEq/L (ref 19–32)
Calcium: 9.8 mg/dL (ref 8.4–10.5)
Creat: 0.68 mg/dL (ref 0.50–1.10)
Glucose, Bld: 115 mg/dL — ABNORMAL HIGH (ref 70–99)
Sodium: 135 mEq/L (ref 135–145)

## 2010-12-03 MED ORDER — POTASSIUM CHLORIDE 20 MEQ PO PACK: 20.0000 meq | PACK | Freq: Three times a day (TID) | ORAL | Status: DC

## 2010-12-03 MED ORDER — CETIRIZINE HCL 10 MG PO TABS: 10.0000 mg | ORAL_TABLET | Freq: Every day | ORAL | Status: DC

## 2010-12-03 MED ORDER — BENZONATATE 100 MG PO CAPS: 100.0000 mg | ORAL_CAPSULE | Freq: Four times a day (QID) | ORAL | Status: DC | PRN

## 2010-12-03 MED ORDER — MAGNESIUM OXIDE 400 MG PO TABS: 400.0000 mg | ORAL_TABLET | Freq: Every day | ORAL | Status: DC

## 2010-12-03 NOTE — Patient Instructions (Addendum)
   Please follow-up at the clinic in 1 week for repeat of potassium level, then 1 month with your PCP, at which time we will reevaluate your potassium, blood pressure.  Decrease potassium to 3x/ day and come back in 1 week.  Please start the magnesium.  If you have been started on new medication(s), and you develop throat closing, tongue swelling, rash, please stop the medication and call the clinic at [phone] and go to the ER.  If symptoms worsen, or new symptoms arise, please call the clinic or go to the ER.  Please bring all of your medications in a bag to your next visit.

## 2010-12-03 NOTE — Progress Notes (Signed)
Subjective:    [name], female    DOB: April 11, 1961, 50 y.o.   MRN: [id number]  HPI Pt is a 50 y.o. female who  has a past medical history of HIV infection; Hepatitis C; Hypertension and presents to clinic today for the following:  1) Hypokalemia - pt is here for reevaluation of her hypokalemia was recently found to have a K of 2.8, she was asked to increase to 40 mEq BID. Is here for recheck. Confirms symptoms of calf cramping, especially at night and mild muscle weakness, denies chest pain, palpitations, tachycardia, hematuria.   2) Cough - dry cough  X 1 week with associated itchy, watery eyes. Notably has had more exposure to grass recently as her neighbor is engaging in significant gardening - she frequently has allergic response to grass. Denies fevers, chills, nasal congestion, ear pain, ear discharge. Does have a history of reflux. Confirms sick contact.  3) HTN - Patient does not check blood pressure regularly at home. Currently taking hydrochlorothiazide and amlodipine. Denies headaches, dizziness, lightheadedness, chest pain, shortness of breath.  Does not request refills today.    Review of Systems Per HPI.  Current Outpatient Medications Medication Sig  . albuterol (VENTOLIN HFA) 108 (90 BASE) MCG/ACT inhaler Inhale into the lungs every 6 (six) hours as needed. Take 1-2 puffs every 6 hrs as needed   . ALLERGY RELIEF 10 MG tablet TAKE 1 TABLET BY MOUTH DAILY  . amLODipine (NORVASC) 10 MG tablet Take 1 tablet (10 mg total) by mouth daily.  Marland Kitchen BECONASE AQ 42 MCG/SPRAY nasal spray USE 2 SPRAYS IN EACH NOSTRIL TWICE DAILY  . emtricitabine-tenofovir (TRUVADA) 200-300 MG per tablet Take 1 tablet by mouth daily.  . hydrochlorothiazide 25 MG tablet Take 1 tablet (25 mg total) by mouth daily.  Marland Kitchen LEXIVA 700 MG tablet TAKE 2 TABLETS BY MOUTH DAILY  . montelukast (SINGULAIR) 10 MG tablet Take 1 tablet (10 mg total) by mouth daily.  . naproxen (NAPROSYN) 500 MG tablet Take 1  tablet (500 mg total) by mouth 2 (two) times daily between meals as needed. For back pain  . NORVIR 100 MG TABS TAKE 2 TABLETS BY MOUTH DAILY  . pantoprazole (PROTONIX) 20 MG tablet Take 1 tablet (20 mg total) by mouth 2 (two) times daily.  . potassium chloride (KLOR-CON) 20 MEQ packet Take 20 mEq by mouth 4 (four) times daily.  . pravastatin (PRAVACHOL) 40 MG tablet Take 1 tablet (40 mg total) by mouth daily.  . traMADol (ULTRAM) 50 MG tablet Take 100mg  (2 tablets) by mouth every 4-6 hours as needed for pain.    Allergies Acetaminophen and Morphine   Past Medical History  Diagnosis Date  . HIV infection     1994  . Hepatitis C   . Hypertension   . GERD (gastroesophageal reflux disease)   . Allergy   . Depression     No past surgical history on file.     Objective:   Physical Exam   Filed Vitals:   12/03/10 1527  BP: 139/82  Pulse: 82  Temp: 98.4 F (36.9 C)      General: Vital signs reviewed and noted. Well-developed, well-nourished, in no acute distress; alert, appropriate and cooperative throughout examination.  Head: Normocephalic, atraumatic.  Neck: No deformities, masses, or tenderness noted.  Lungs:  Normal respiratory effort. Clear to auscultation BL without crackles or wheezes.  Heart: RRR. S1 and S2 normal without gallop, murmur, or rubs.  Abdomen:  BS  normoactive. Soft, Nondistended, non-tender.  No masses or organomegaly.  Extremities: No pretibial edema.          Assessment & Plan:  Case and plan of care discussed with Dr. Doneen Poisson.

## 2010-12-03 NOTE — Assessment & Plan Note (Signed)
BP Readings from Last 3 Encounters:  12/03/10 139/82  10/05/10 137/86  09/27/10 161/83    Basic Metabolic Panel:    Component Value Date/Time   NA 135 12/03/2010 1512   K 3.5 12/03/2010 1512   CL 98 12/03/2010 1512   CO2 25 12/03/2010 1512   BUN 15 12/03/2010 1512   CREATININE 0.68 12/03/2010 1512   CREATININE 0.9 06/17/2010 1541   GLUCOSE 115* 12/03/2010 1512   CALCIUM 9.8 12/03/2010 1512    Assessment: Hypertension control:   controlled  Progress toward goals:   at goal Barriers to meeting goals:  no barriers identified  Plan: Hypertension treatment:   - continue current medications

## 2010-12-03 NOTE — Assessment & Plan Note (Signed)
Assessment: Disease Control:  controlled Progress toward goals:  improved Barriers to meeting goals:  patient with low-normal magnesium levels, and likely needs to stay on her HCTZ as previously has been tried on multiple other antihypertensives without adequate blood pressure control.  Plan: Treatment:     - Decrease potassium to 60 mEq daily - Recheck K and Mg recheck in 1 week. - Add Magnesium oxide supplementation 400mg  daily

## 2010-12-03 NOTE — Assessment & Plan Note (Signed)
Likely allergic cough due to increased grass exposure, which is a known allergen for the patient.  - Change to Zyrtec - Avoid allergens as much as possible. - Continue nasal spray.

## 2010-12-09 ENCOUNTER — Ambulatory Visit (INDEPENDENT_AMBULATORY_CARE_PROVIDER_SITE_OTHER): Payer: Medicaid Other | Admitting: Internal Medicine

## 2010-12-09 DIAGNOSIS — E876 Hypokalemia: Secondary | ICD-10-CM

## 2010-12-09 LAB — POTASSIUM: Potassium: 3.4 mEq/L — ABNORMAL LOW (ref 3.5–5.3)

## 2010-12-09 LAB — MAGNESIUM: Magnesium: 2.6 mg/dL — ABNORMAL HIGH (ref 1.5–2.5)

## 2010-12-10 ENCOUNTER — Other Ambulatory Visit: Payer: Self-pay | Admitting: Internal Medicine

## 2010-12-10 DIAGNOSIS — E876 Hypokalemia: Secondary | ICD-10-CM

## 2010-12-10 NOTE — Progress Notes (Signed)
Called pt and informed.  Voices understanding of med changes. Scheduled for lab appointment next Thursday.

## 2010-12-13 ENCOUNTER — Other Ambulatory Visit: Payer: Self-pay | Admitting: Internal Medicine

## 2010-12-15 ENCOUNTER — Ambulatory Visit (INDEPENDENT_AMBULATORY_CARE_PROVIDER_SITE_OTHER): Payer: Medicaid Other | Admitting: Internal Medicine

## 2010-12-15 ENCOUNTER — Ambulatory Visit: Payer: Medicaid Other | Admitting: Internal Medicine

## 2010-12-15 ENCOUNTER — Encounter: Payer: Self-pay | Admitting: Internal Medicine

## 2010-12-15 VITALS — BP 150/93 | HR 74 | Temp 97.0°F | Resp 20 | Ht 61.0 in | Wt 145.9 lb

## 2010-12-15 DIAGNOSIS — R05 Cough: Secondary | ICD-10-CM

## 2010-12-15 DIAGNOSIS — E876 Hypokalemia: Secondary | ICD-10-CM

## 2010-12-15 DIAGNOSIS — I10 Essential (primary) hypertension: Secondary | ICD-10-CM

## 2010-12-15 LAB — BASIC METABOLIC PANEL
BUN: 12 mg/dL (ref 6–23)
Calcium: 9.7 mg/dL (ref 8.4–10.5)
Potassium: 3.6 mEq/L (ref 3.5–5.3)
Sodium: 140 mEq/L (ref 135–145)

## 2010-12-16 ENCOUNTER — Encounter: Payer: Self-pay | Admitting: Internal Medicine

## 2010-12-16 ENCOUNTER — Telehealth: Payer: Self-pay | Admitting: *Deleted

## 2010-12-16 ENCOUNTER — Other Ambulatory Visit: Payer: Self-pay | Admitting: Infectious Diseases

## 2010-12-16 NOTE — Assessment & Plan Note (Signed)
We will check electrolyte panel today and readjust medication regimen if indicated. She was advised to take potassium as recommended until test results come back.

## 2010-12-16 NOTE — Progress Notes (Signed)
  Subjective:    [name], female    DOB: November 21, 1960, 50 y.o.   MRN: [id number]  HPI  Patient is 50 year old female with past medical history outlined below who presents to clinic with main concern of persistent, productive cough of yellowish to greenish sputum. Cough started approximately 1-2 weeks ago and has not been improving. She has tried [employer] as recommended by prior physician. She reports no relief in terms of sputum amount and cough. In addition she reports persistent fevers and chills but she has not measured her temperature. She also tells me that due to her constant cough or chest hurts. She would like to discuss another medication regimen for relief. She denies sick contacts or exposures, no shortness of breath, no abdominal or urinary concerns, no headaches or sinus congestion and tenderness. She also denies rhinorrhea, postnasal drip, odynophagia or dysphasia.  Review of Systems Per HPI    Objective:   Physical Exam  Constitutional: Vital signs reviewed.  Patient is a well-developed and well-nourished in no acute distress and cooperative with exam. Alert and oriented x3.  Head: Normocephalic and atraumatic Ear: TM normal bilaterally Mouth: no erythema or exudates, MMM Eyes: PERRL, EOMI, conjunctivae normal, No scleral icterus.  Neck: Supple, Trachea midline normal ROM, No JVD, mass, thyromegaly, or carotid bruit present.  Cardiovascular: RRR, S1 normal, S2 normal, no MRG, pulses symmetric and intact bilaterally Pulmonary/Chest: CTAB, no wheezes, rales, or rhonchi, coughing throughout the examination but no sputum noted Abdominal: Soft. Non-tender, non-distended, bowel sounds are normal, no masses, organomegaly, or guarding present.  Hematology: no cervical adenopathy.  Skin: Warm, dry and intact. No rash, cyanosis, or clubbing.         Assessment & Plan:

## 2010-12-16 NOTE — Telephone Encounter (Addendum)
Message left from pt stated that she would like for her prescription for Tussinex to be sent to [facility].  Spoke with Dr. Darrick Grinder was given the prescription in hand and will need to bring the prescription back to the Clinics so that it can be sent to [facility].  Spoke with pt's daughter pt is unavailable.  Will have the patient call the Clinics when she returns home.  Pt later brought in prescription for the Tussinex.  Prescription was called to the [facility].  Pharmacist said that pt's insurance will not cover.  Will need to change prescription to Cheratussin AC.

## 2010-12-16 NOTE — Assessment & Plan Note (Signed)
Review of previous records indicate the patient has chronic cough which was determined to be allergic in etiology. Symptoms as she is describing of productive cough and subjective fevers and chills are certainly worrisome for viral pneumoniae or bacteria especially given her low CD4 count in January 2012. She tells me she has had CD4 count checked recently but I was not able to found the data. I will start patient on Zithromax for 7 days. I have advised her to come back to see me if her symptoms persist or get worse. I have advised her to come back in one to 2 weeks for reevaluation. On that visit it will be important to check CD4 count since I have not seen any recent data. It may be necessary to obtain chest x-ray if there is no improvement.

## 2010-12-16 NOTE — Assessment & Plan Note (Signed)
Blood pressure above the target goal. Patient tells me she checks her blood pressure regularly at home and her numbers are usually less than 140/90. We will make no changes to current medication regimen and I encouraged her to monitor her blood pressure regularly and call us back if her numbers are persistently elevated and higher than 140/90.

## 2010-12-17 ENCOUNTER — Other Ambulatory Visit: Payer: Medicaid Other

## 2011-01-11 ENCOUNTER — Encounter: Payer: Medicaid Other | Admitting: Internal Medicine

## 2011-01-12 LAB — T-HELPER CELL (CD4) - (RCID CLINIC ONLY): CD4 % Helper T Cell: 21 — ABNORMAL LOW

## 2011-01-13 ENCOUNTER — Other Ambulatory Visit: Payer: Self-pay | Admitting: Internal Medicine

## 2011-01-13 ENCOUNTER — Ambulatory Visit (INDEPENDENT_AMBULATORY_CARE_PROVIDER_SITE_OTHER): Payer: Medicaid Other | Admitting: Internal Medicine

## 2011-01-13 ENCOUNTER — Encounter: Payer: Self-pay | Admitting: Internal Medicine

## 2011-01-13 DIAGNOSIS — E876 Hypokalemia: Secondary | ICD-10-CM

## 2011-01-13 DIAGNOSIS — Z23 Encounter for immunization: Secondary | ICD-10-CM

## 2011-01-13 DIAGNOSIS — I10 Essential (primary) hypertension: Secondary | ICD-10-CM

## 2011-01-13 DIAGNOSIS — B379 Candidiasis, unspecified: Secondary | ICD-10-CM | POA: Insufficient documentation

## 2011-01-13 LAB — BASIC METABOLIC PANEL
CO2: 23 mEq/L (ref 19–32)
Calcium: 10.1 mg/dL (ref 8.4–10.5)
Sodium: 140 mEq/L (ref 135–145)

## 2011-01-13 MED ORDER — FLUCONAZOLE 150 MG PO TABS: 150.0000 mg | ORAL_TABLET | Freq: Once | ORAL | Status: AC

## 2011-01-13 NOTE — Patient Instructions (Signed)
Schedule follow up appointment with Dr. Arvilla Market in December. We'll call you if any of your lab work is abnormal.. You can call the clinic at [phone] with any concerns or questions. Ask to speak to  or leave a message for Dr. Arvilla Market. Diflucan prescription was sent to your pharmacy. If you do not receive this medication please call me and I will resubmit it. Keep up the good work!

## 2011-01-13 NOTE — Telephone Encounter (Signed)
rx given to pt at [facility] today

## 2011-01-13 NOTE — Assessment & Plan Note (Signed)
Pt is currently taking of KCl and is feeling better.  WIll check electrolytes today.  Mag wnl; will not repeat today.

## 2011-01-14 ENCOUNTER — Other Ambulatory Visit: Payer: Self-pay | Admitting: *Deleted

## 2011-01-17 MED ORDER — PRAVASTATIN SODIUM 40 MG PO TABS: 40.0000 mg | ORAL_TABLET | Freq: Every day | ORAL | Status: DC

## 2011-01-17 MED ORDER — TRAMADOL HCL 50 MG PO TABS: ORAL_TABLET | ORAL | Status: DC

## 2011-01-18 ENCOUNTER — Telehealth: Payer: Self-pay | Admitting: *Deleted

## 2011-01-18 ENCOUNTER — Telehealth: Payer: Self-pay | Admitting: Internal Medicine

## 2011-01-18 DIAGNOSIS — E876 Hypokalemia: Secondary | ICD-10-CM

## 2011-01-18 LAB — T-HELPER CELL (CD4) - (RCID CLINIC ONLY)
CD4 % Helper T Cell: 17 — ABNORMAL LOW
CD4 T Cell Abs: 380 — ABNORMAL LOW

## 2011-01-18 NOTE — Telephone Encounter (Signed)
Message copied by Hassan Buckler on Tue Jan 18, 2011 11:45 AM ------      Message from: Verdene Rio      Created: Tue Jan 18, 2011 10:03 AM       Hey!  Would you please contact Ms. Mierzwa to help her schedule a lab appt for repeat BMET this week or early next week?  Thanks!            -K

## 2011-01-18 NOTE — Telephone Encounter (Signed)
ERROR

## 2011-01-18 NOTE — Assessment & Plan Note (Signed)
Pt reports vaginal itching and sx of vaginal yeast infection.  She has been taking abx recently (prescribed by her dentist) for treatment of an infected tooth and has now developed a yeast infection.  She denies fever, chills, dysuria, vaginal pain, vaginal lesions, abnormal bleeding, or abnormal vaginal discharge.  Will provide rx for diflucan.  She will be on abx for the next 10 days until she is scheduled for dental work.  Will give her refills as her vaginal candidiasis may not entirely resolve and/or may recur with continued abx.  Advised pt to return if her sx do not resolve after cessation of abx and tx with diflucan or if she develops fever, chills, abnormal vaginal discharge/bleeding, or other concerning symptom.  Pt expresses understanding and agreement to plan.

## 2011-01-18 NOTE — Assessment & Plan Note (Signed)
BP stable.  Hypokalemia may be due to HCTZ.  If KCl still low, will need to consider d/c HCTZ as this could cause her low K.

## 2011-01-18 NOTE — Telephone Encounter (Signed)
Appt has been scheduled for Fri 01/21/11 @ 0930AM.

## 2011-01-18 NOTE — Telephone Encounter (Signed)
Called pt to discuss low K.  Advised her to resume 4 KCl tabs daily (total of KCl).  Will have her return for repeat BMET this week/early next week.  I anticipate she will need to continue with KCl supplementation.  She is experiencing some GI side effects 2/2 KCl tabs.  Will attempt to get liquid formulation for her to minimize adverse effects once ongoing dose established after next BMET.

## 2011-01-18 NOTE — Progress Notes (Signed)
  Subjective:    [name], female    DOB: 23-Mar-1961, 50 y.o.   MRN: [id number]  HPI Please see the A&P for the status of the pt's chronic medical problems and acute medical complaints.    Review of Systems  Constitutional: Negative for fever, chills, diaphoresis, activity change, appetite change, fatigue and unexpected weight change.  HENT: Negative for hearing loss, congestion and neck stiffness.   Eyes: Negative for photophobia, pain and visual disturbance.  Respiratory: Negative for cough, chest tightness, shortness of breath and wheezing.   Cardiovascular: Negative for chest pain and palpitations.  Gastrointestinal: Negative for nausea, vomiting, abdominal pain, diarrhea, blood in stool and anal bleeding.  Genitourinary: Negative for dysuria, urgency, frequency, hematuria, flank pain, decreased urine volume, vaginal bleeding, vaginal discharge, difficulty urinating, genital sores, vaginal pain and pelvic pain.  Musculoskeletal: Negative for joint swelling.  Neurological: Negative for dizziness, syncope, speech difficulty, weakness, numbness and headaches.   Pt admits to vaginal itching and some external redness and irritation.    Objective:   Physical Exam  VItal signs reviewed and stable. GEN: No apparent distress.  Alert and oriented x 3.  Pleasant, conversant, and cooperative to exam. HEENT: head is autraumatic and normocephalic.  Neck is supple without palpable masses or lymphadenopathy.  No JVD or carotid bruits.  Vision intact.  EOMI.  PERRLA.  Sclerae anicteric.  Conjunctivae without pallor or injection. Mucous membranes are moist.  Oropharynx is without erythema, exudates, or other abnormal lesions.  Dentition is fair.  No facial swelling or erythema. RESP:  Lungs are clear to ascultation bilaterally with good air movement.  No wheezes, ronchi, or rubs. CARDIOVASCULAR: regular rate, normal rhythm.  Clear S1, S2, no murmurs, gallops, or rubs. ABDOMEN: soft,  non-tender, non-distended.  Bowels sounds present in all quadrants and normoactive.  No palpable masses. EXT: warm and dry.  Peripheral pulses equal, intact, and +2 globally.  No clubbing or cyanosis. No edema in bilateral lower extremities. SKIN: warm and dry with normal turgor.  No rashes or abnormal lesions observed. NEURO: CN II-XII grossly intact.  Muscle strength +5/5 in bilateral upper and lower extremities.  Sensation is grossly intact.  No focal deficit.       Assessment & Plan:

## 2011-01-19 LAB — BASIC METABOLIC PANEL
CO2: 25
Calcium: 8.5
Creatinine, Ser: 0.64
GFR calc Af Amer: >60
GFR calc non Af Amer: >60
Glucose, Bld: 145 — ABNORMAL HIGH
Sodium: 136

## 2011-01-19 LAB — DIFFERENTIAL
Lymphocytes Relative: 37
Lymphs Abs: 2.2
Monocytes Absolute: 0.6
Monocytes Relative: 11
Neutro Abs: 2.9

## 2011-01-19 LAB — POCT I-STAT, CHEM 8
Calcium, Ion: 1.08 — ABNORMAL LOW
Glucose, Bld: 90
HCT: 41
Hemoglobin: 13.9
Potassium: 3.1 — ABNORMAL LOW
TCO2: 27

## 2011-01-19 LAB — LIPID PANEL
Cholesterol: 155
HDL: 24 — ABNORMAL LOW
Total CHOL/HDL Ratio: 6.5
VLDL: 29

## 2011-01-19 LAB — CBC
HCT: 37.8
Hemoglobin: 11.4 — ABNORMAL LOW
Hemoglobin: 12.4
MCHC: 33
MCV: 81.8
RBC: 4.22
RBC: 4.59
RDW: 15.2

## 2011-01-19 LAB — RAPID URINE DRUG SCREEN, HOSP PERFORMED
Amphetamines: NOT DETECTED
Barbiturates: NOT DETECTED
Benzodiazepines: NOT DETECTED
Cocaine: POSITIVE — AB

## 2011-01-19 LAB — URINALYSIS, ROUTINE W REFLEX MICROSCOPIC
Bilirubin Urine: NEGATIVE
Ketones, ur: NEGATIVE
Nitrite: NEGATIVE
Protein, ur: NEGATIVE

## 2011-01-19 LAB — COMPREHENSIVE METABOLIC PANEL
Alkaline Phosphatase: 56
BUN: 5 — ABNORMAL LOW
CO2: 26
Chloride: 100
Creatinine, Ser: 0.62
GFR calc non Af Amer: >60
Glucose, Bld: 125 — ABNORMAL HIGH
Total Bilirubin: 0.7

## 2011-01-19 LAB — LIPASE, BLOOD: Lipase: 40

## 2011-01-19 LAB — POCT CARDIAC MARKERS
CKMB, poc: 1.3
Myoglobin, poc: 47.8
Operator id: 151321

## 2011-01-19 LAB — APTT: aPTT: 32

## 2011-01-19 LAB — CARDIAC PANEL(CRET KIN+CKTOT+MB+TROPI): Total CK: 75

## 2011-01-19 LAB — CK TOTAL AND CKMB (NOT AT ARMC): CK, MB: 0.4

## 2011-01-19 LAB — TROPONIN I: Troponin I: 0.01

## 2011-01-21 ENCOUNTER — Other Ambulatory Visit: Payer: Medicaid Other

## 2011-01-21 LAB — T-HELPER CELL (CD4) - (RCID CLINIC ONLY)
CD4 % Helper T Cell: 16 — ABNORMAL LOW
CD4 T Cell Abs: 390 — ABNORMAL LOW

## 2011-01-27 LAB — CBC
HCT: 36.2 % (ref 36.0–46.0)
Hemoglobin: 10.9 g/dL — ABNORMAL LOW (ref 12.0–15.0)
Hemoglobin: 11.8 g/dL — ABNORMAL LOW (ref 12.0–15.0)
MCHC: 32.7 g/dL (ref 30.0–36.0)
MCHC: 32.7 g/dL (ref 30.0–36.0)
MCV: 82.4 fL (ref 78.0–100.0)
Platelets: 113 10*3/uL — ABNORMAL LOW (ref 150–400)
Platelets: 123 10*3/uL — ABNORMAL LOW (ref 150–400)
Platelets: 130 10*3/uL — ABNORMAL LOW (ref 150–400)
RBC: 4.01 MIL/uL (ref 3.87–5.11)
RBC: 4.71 MIL/uL (ref 3.87–5.11)
RDW: 15.8 % — ABNORMAL HIGH (ref 11.5–15.5)
RDW: 15.8 % — ABNORMAL HIGH (ref 11.5–15.5)
WBC: 4 10*3/uL (ref 4.0–10.5)
WBC: 4.4 10*3/uL (ref 4.0–10.5)

## 2011-01-27 LAB — BASIC METABOLIC PANEL
BUN: 3 mg/dL — ABNORMAL LOW (ref 6–23)
BUN: 4 mg/dL — ABNORMAL LOW (ref 6–23)
BUN: 7 mg/dL (ref 6–23)
BUN: 8 mg/dL (ref 6–23)
BUN: <1 mg/dL — ABNORMAL LOW (ref 6–23)
CO2: 22 mEq/L (ref 19–32)
CO2: 23 mEq/L (ref 19–32)
CO2: 25 mEq/L (ref 19–32)
Calcium: 7.5 mg/dL — ABNORMAL LOW (ref 8.4–10.5)
Calcium: 8 mg/dL — ABNORMAL LOW (ref 8.4–10.5)
Calcium: 8.2 mg/dL — ABNORMAL LOW (ref 8.4–10.5)
Calcium: 8.4 mg/dL (ref 8.4–10.5)
Calcium: 8.5 mg/dL (ref 8.4–10.5)
Chloride: 102 mEq/L (ref 96–112)
Chloride: 102 mEq/L (ref 96–112)
Chloride: 103 mEq/L (ref 96–112)
Chloride: 106 mEq/L (ref 96–112)
Creatinine, Ser: 0.58 mg/dL (ref 0.4–1.2)
Creatinine, Ser: 0.63 mg/dL (ref 0.4–1.2)
Creatinine, Ser: 0.64 mg/dL (ref 0.4–1.2)
Creatinine, Ser: 0.66 mg/dL (ref 0.4–1.2)
Creatinine, Ser: 0.68 mg/dL (ref 0.4–1.2)
Creatinine, Ser: 0.81 mg/dL (ref 0.4–1.2)
GFR calc Af Amer: >60 mL/min (ref >60)
GFR calc Af Amer: >60 mL/min (ref >60)
GFR calc Af Amer: >60 mL/min (ref >60)
GFR calc Af Amer: >60 mL/min (ref >60)
GFR calc non Af Amer: >60 mL/min (ref >60)
GFR calc non Af Amer: >60 mL/min (ref >60)
GFR calc non Af Amer: >60 mL/min (ref >60)
GFR calc non Af Amer: >60 mL/min (ref >60)
GFR calc non Af Amer: >60 mL/min (ref >60)
GFR calc non Af Amer: >60 mL/min (ref >60)
Glucose, Bld: 101 mg/dL — ABNORMAL HIGH (ref 70–99)
Glucose, Bld: 105 mg/dL — ABNORMAL HIGH (ref 70–99)
Glucose, Bld: 91 mg/dL (ref 70–99)
Potassium: 3.7 mEq/L (ref 3.5–5.1)
Potassium: 4 mEq/L (ref 3.5–5.1)
Potassium: 4.6 mEq/L (ref 3.5–5.1)
Sodium: 133 mEq/L — ABNORMAL LOW (ref 135–145)
Sodium: 134 mEq/L — ABNORMAL LOW (ref 135–145)
Sodium: 134 mEq/L — ABNORMAL LOW (ref 135–145)
Sodium: 136 mEq/L (ref 135–145)
Sodium: 136 mEq/L (ref 135–145)

## 2011-01-27 LAB — DIFFERENTIAL
Basophils Absolute: 0 10*3/uL (ref 0.0–0.1)
Basophils Relative: 0 % (ref 0–1)
Eosinophils Absolute: 0.1 10*3/uL (ref 0.0–0.7)
Monocytes Relative: 11 % (ref 3–12)
Neutro Abs: 2.2 10*3/uL (ref 1.7–7.7)
Neutrophils Relative %: 45 % (ref 43–77)

## 2011-01-27 LAB — URINE MICROSCOPIC-ADD ON

## 2011-01-27 LAB — POCT I-STAT, CHEM 8
Creatinine, Ser: 0.8 mg/dL (ref 0.4–1.2)
HCT: 40 % (ref 36.0–46.0)
Hemoglobin: 13.6 g/dL (ref 12.0–15.0)
Sodium: 139 mEq/L (ref 135–145)
TCO2: 24 mmol/L (ref 0–100)

## 2011-01-27 LAB — CLOSTRIDIUM DIFFICILE EIA: C difficile Toxins A+B, EIA: NEGATIVE

## 2011-01-27 LAB — URINALYSIS, ROUTINE W REFLEX MICROSCOPIC
Bilirubin Urine: NEGATIVE
Glucose, UA: NEGATIVE mg/dL
Hgb urine dipstick: NEGATIVE
Specific Gravity, Urine: 1.013 (ref 1.005–1.030)

## 2011-01-27 LAB — MAGNESIUM: Magnesium: 1.9 mg/dL (ref 1.5–2.5)

## 2011-01-27 LAB — POCT CARDIAC MARKERS: Myoglobin, poc: 89.6 ng/mL (ref 12–200)

## 2011-01-27 LAB — LEGIONELLA ANTIGEN, URINE: Legionella Antigen, Urine: NEGATIVE

## 2011-01-27 LAB — RAPID URINE DRUG SCREEN, HOSP PERFORMED
Amphetamines: NOT DETECTED
Benzodiazepines: NOT DETECTED
Cocaine: NOT DETECTED
Tetrahydrocannabinol: POSITIVE — AB

## 2011-02-04 LAB — T-HELPER CELL (CD4) - (RCID CLINIC ONLY)
CD4 % Helper T Cell: 18 — ABNORMAL LOW
CD4 T Cell Abs: 450

## 2011-02-08 ENCOUNTER — Other Ambulatory Visit: Payer: Self-pay | Admitting: *Deleted

## 2011-02-08 MED ORDER — CETIRIZINE HCL 10 MG PO TABS: 10.0000 mg | ORAL_TABLET | Freq: Every day | ORAL | Status: DC

## 2011-02-08 NOTE — Telephone Encounter (Signed)
Pharmacy would like to get additional refills.

## 2011-02-15 ENCOUNTER — Other Ambulatory Visit: Payer: Self-pay | Admitting: *Deleted

## 2011-02-15 MED ORDER — ALBUTEROL SULFATE HFA 108 (90 BASE) MCG/ACT IN AERS: 1.0000 | INHALATION_SPRAY | Freq: Four times a day (QID) | RESPIRATORY_TRACT | Status: DC | PRN

## 2011-02-17 ENCOUNTER — Other Ambulatory Visit: Payer: Self-pay | Admitting: *Deleted

## 2011-02-17 DIAGNOSIS — E876 Hypokalemia: Secondary | ICD-10-CM

## 2011-02-17 MED ORDER — POTASSIUM CHLORIDE 20 MEQ PO PACK: 20.0000 meq | PACK | Freq: Three times a day (TID) | ORAL | Status: DC

## 2011-02-17 MED ORDER — AMLODIPINE BESYLATE 10 MG PO TABS: 10.0000 mg | ORAL_TABLET | Freq: Every day | ORAL | Status: DC

## 2011-02-17 NOTE — Telephone Encounter (Signed)
Requesting tablets not packets.  Thanks

## 2011-02-18 MED ORDER — POTASSIUM CHLORIDE CRYS ER 20 MEQ PO TBCR: 20.0000 meq | EXTENDED_RELEASE_TABLET | Freq: Three times a day (TID) | ORAL | Status: DC

## 2011-02-18 NOTE — Telephone Encounter (Signed)
Dr Arvilla Market, what is the # of refills for the Potassium chloride?  Thanks

## 2011-02-18 NOTE — Telephone Encounter (Signed)
Changed rx to tablets with #3 refills. I also placed an order for a repeat BMET.  Would you please call her to schedule a lab appt in the next 1-2 weeks?  Thanks!

## 2011-02-23 NOTE — Telephone Encounter (Signed)
Lab appt has been scheduled  On 11/9.

## 2011-03-04 ENCOUNTER — Other Ambulatory Visit (INDEPENDENT_AMBULATORY_CARE_PROVIDER_SITE_OTHER): Payer: Medicaid Other

## 2011-03-04 DIAGNOSIS — E876 Hypokalemia: Secondary | ICD-10-CM

## 2011-03-04 DIAGNOSIS — I10 Essential (primary) hypertension: Secondary | ICD-10-CM

## 2011-03-04 LAB — BASIC METABOLIC PANEL
CO2: 27 mEq/L (ref 19–32)
Chloride: 101 mEq/L (ref 96–112)
Potassium: 3.3 mEq/L — ABNORMAL LOW (ref 3.5–5.3)
Sodium: 139 mEq/L (ref 135–145)

## 2011-03-04 NOTE — Telephone Encounter (Signed)
Addended by: Bufford Spikes on: 03/04/2011 03:11 PM   Modules accepted: Orders

## 2011-03-04 NOTE — Telephone Encounter (Signed)
Addended by: Bufford Spikes on: 03/04/2011 03:10 PM   Modules accepted: Orders

## 2011-03-22 ENCOUNTER — Emergency Department (HOSPITAL_COMMUNITY)
Admission: EM | Admit: 2011-03-22 | Discharge: 2011-03-22 | Disposition: A | Discharge status: Home / Self Care | Payer: Medicaid Other | Attending: Emergency Medicine | Admitting: Emergency Medicine

## 2011-03-22 ENCOUNTER — Emergency Department (HOSPITAL_COMMUNITY): Payer: Medicaid Other

## 2011-03-22 ENCOUNTER — Encounter (HOSPITAL_COMMUNITY): Payer: Self-pay | Admitting: Emergency Medicine

## 2011-03-22 DIAGNOSIS — F329 Major depressive disorder, single episode, unspecified: Secondary | ICD-10-CM | POA: Insufficient documentation

## 2011-03-22 DIAGNOSIS — Z8619 Personal history of other infectious and parasitic diseases: Secondary | ICD-10-CM | POA: Insufficient documentation

## 2011-03-22 DIAGNOSIS — R112 Nausea with vomiting, unspecified: Secondary | ICD-10-CM | POA: Insufficient documentation

## 2011-03-22 DIAGNOSIS — K219 Gastro-esophageal reflux disease without esophagitis: Secondary | ICD-10-CM | POA: Insufficient documentation

## 2011-03-22 DIAGNOSIS — F3289 Other specified depressive episodes: Secondary | ICD-10-CM | POA: Insufficient documentation

## 2011-03-22 DIAGNOSIS — Z21 Asymptomatic human immunodeficiency virus [HIV] infection status: Secondary | ICD-10-CM | POA: Insufficient documentation

## 2011-03-22 DIAGNOSIS — J069 Acute upper respiratory infection, unspecified: Secondary | ICD-10-CM | POA: Insufficient documentation

## 2011-03-22 DIAGNOSIS — Z79899 Other long term (current) drug therapy: Secondary | ICD-10-CM | POA: Insufficient documentation

## 2011-03-22 DIAGNOSIS — I10 Essential (primary) hypertension: Secondary | ICD-10-CM | POA: Insufficient documentation

## 2011-03-22 DIAGNOSIS — R079 Chest pain, unspecified: Secondary | ICD-10-CM | POA: Insufficient documentation

## 2011-03-22 DIAGNOSIS — R0602 Shortness of breath: Secondary | ICD-10-CM | POA: Insufficient documentation

## 2011-03-22 DIAGNOSIS — IMO0001 Reserved for inherently not codable concepts without codable children: Secondary | ICD-10-CM | POA: Insufficient documentation

## 2011-03-22 DIAGNOSIS — R509 Fever, unspecified: Secondary | ICD-10-CM | POA: Insufficient documentation

## 2011-03-22 HISTORY — DX: Unspecified osteoarthritis, unspecified site: M19.90

## 2011-03-22 HISTORY — DX: Heart failure, unspecified: I50.9

## 2011-03-22 LAB — DIFFERENTIAL
Basophils Absolute: 0 10*3/uL (ref 0.0–0.1)
Basophils Relative: 0 % (ref 0–1)
Lymphocytes Relative: 14 % (ref 12–46)
Monocytes Absolute: 1 10*3/uL (ref 0.1–1.0)
Neutro Abs: 5.1 10*3/uL (ref 1.7–7.7)

## 2011-03-22 LAB — CBC
HCT: 37.3 % (ref 36.0–46.0)
MCHC: 33.5 g/dL (ref 30.0–36.0)
Platelets: 155 10*3/uL (ref 150–400)
RDW: 15.4 % (ref 11.5–15.5)
WBC: 7.3 10*3/uL (ref 4.0–10.5)

## 2011-03-22 LAB — BASIC METABOLIC PANEL
BUN: 8 mg/dL (ref 6–23)
Calcium: 8.9 mg/dL (ref 8.4–10.5)
Creatinine, Ser: 0.63 mg/dL (ref 0.50–1.10)
GFR calc Af Amer: >90 mL/min (ref >90)
GFR calc non Af Amer: >90 mL/min (ref >90)

## 2011-03-22 LAB — PRO B NATRIURETIC PEPTIDE: Pro B Natriuretic peptide (BNP): 226.5 pg/mL — ABNORMAL HIGH (ref 0–125)

## 2011-03-22 LAB — POCT I-STAT TROPONIN I

## 2011-03-22 LAB — D-DIMER, QUANTITATIVE: D-Dimer, Quant: 0.73 ug/mL-FEU — ABNORMAL HIGH (ref 0.00–0.48)

## 2011-03-22 MED ORDER — ONDANSETRON HCL 4 MG PO TABS: 4.0000 mg | ORAL_TABLET | Freq: Four times a day (QID) | ORAL | Status: AC

## 2011-03-22 MED ORDER — ONDANSETRON HCL 4 MG/2ML IJ SOLN
4.0000 mg | Freq: Once | INTRAMUSCULAR | Status: AC
  Administered 2011-03-22: 4 mg via INTRAVENOUS
  Filled 2011-03-22: qty 2

## 2011-03-22 MED ORDER — IOHEXOL 300 MG/ML  SOLN: 80.0000 mL | Freq: Once | INTRAMUSCULAR | Status: DC | PRN

## 2011-03-22 MED ORDER — MORPHINE SULFATE 4 MG/ML IJ SOLN: 4.0000 mg | Freq: Once | INTRAMUSCULAR | Status: DC

## 2011-03-22 MED ORDER — HYDROMORPHONE HCL PF 1 MG/ML IJ SOLN
0.5000 mg | Freq: Once | INTRAMUSCULAR | Status: AC
  Administered 2011-03-22: 0.5 mg via INTRAVENOUS
  Filled 2011-03-22: qty 1

## 2011-03-22 MED ORDER — BENZONATATE 100 MG PO CAPS: 100.0000 mg | ORAL_CAPSULE | Freq: Three times a day (TID) | ORAL | Status: AC

## 2011-03-22 MED ORDER — AZITHROMYCIN 250 MG PO TABS: 250.0000 mg | ORAL_TABLET | Freq: Every day | ORAL | Status: AC

## 2011-03-22 NOTE — ED Provider Notes (Signed)
History     CSN: [id number] Arrival date & time: 03/22/2011 10:28 AM   First MD Initiated Contact with Patient 03/22/11 1029      Chief Complaint  Patient presents with  . Chest Pain    (Consider location/radiation/quality/duration/timing/severity/associated sxs/prior treatment) HPI  Pt developed an acute onset of shortness of breath this morning. She has also been experiencing body aches, and fevers with n/v (1 episode) last night. This morning when she woke up she was also having chest pain at the same time that does not radiate. It was originally 9/10. She took nitro and a baby aspirin and the pain is now 5/10. The patient has a cardiac history positive for CHF, no recent pneumonia, no history of a DVT or PE.  Past Medical History  Diagnosis Date  . HIV infection     1994  . Hepatitis C   . Hypertension   . GERD (gastroesophageal reflux disease)   . Allergy   . Depression     No past surgical history on file.  Family History  Problem Relation Age of Onset  . Heart disease Mother   . Diabetes Mother   . Stroke Mother   . Heart disease Father   . Stroke Father     History  Substance Use Topics  . Smoking status: Former Smoker -- 0.5 packs/day    Types: Cigarettes    Quit date: 04/26/2007  . Smokeless tobacco: Not on file  . Alcohol Use: No    OB History    Grav Para Term Preterm Abortions TAB SAB Ect Mult Living                  Review of Systems  Allergies  Acetaminophen; Dyazide; and Morphine  Home Medications   Current Outpatient Rx  Name Route Sig Dispense Refill  . ALBUTEROL SULFATE HFA 108 (90 BASE) MCG/ACT IN AERS Inhalation Inhale 1 puff into the lungs every 6 (six) hours as needed. Take 1-2 puffs every 6 hrs as needed 1 Inhaler 6  . ALLERGY RELIEF 10 MG PO TABS  TAKE 1 TABLET BY MOUTH DAILY 30 tablet PRN  . AMLODIPINE BESYLATE 10 MG PO TABS Oral Take 1 tablet (10 mg total) by mouth daily. 30 tablet 6  . BECONASE AQ 42 MCG/SPRAY NA SUSP   USE 2 SPRAYS IN EACH NOSTRIL TWICE DAILY 1 Inhaler 3  . CETIRIZINE HCL 10 MG PO TABS Oral Take 1 tablet (10 mg total) by mouth daily. 30 tablet 3  . GUAIFENESIN-CODEINE 100-10 MG/5ML PO SYRP Oral Take 5 mLs by mouth 3 (three) times daily as needed.      Marland Kitchen HYDROCHLOROTHIAZIDE 25 MG PO TABS Oral Take 1 tablet (25 mg total) by mouth daily. 90 tablet 2  . LEXIVA 700 MG PO TABS  TAKE 2 TABLETS BY MOUTH DAILY 60 tablet PRN  . MAGNESIUM OXIDE 400 MG PO TABS Oral Take 1 tablet (400 mg total) by mouth daily. 30 tablet 5  . MONTELUKAST SODIUM 10 MG PO TABS Oral Take 1 tablet (10 mg total) by mouth daily. 90 tablet 2  . NORVIR 100 MG PO TABS  TAKE 2 TABLETS BY MOUTH DAILY 60 tablet PRN  . PANTOPRAZOLE SODIUM 20 MG PO TBEC Oral Take 1 tablet (20 mg total) by mouth 2 (two) times daily. 180 tablet 4  . POTASSIUM CHLORIDE CRYS CR 20 MEQ PO TBCR Oral Take 1 tablet (20 mEq total) by mouth 3 (three) times daily. 90 tablet 3  .  PRAVASTATIN SODIUM 40 MG PO TABS Oral Take 1 tablet (40 mg total) by mouth daily. 180 tablet 0  . TRAMADOL HCL 50 MG PO TABS  Take 100mg  (2 tablets) by mouth every 4-6 hours as needed for pain. 90 tablet 3  . TRUVADA 200-300 MG PO TABS  TAKE 1 TABLET BY MOUTH EVERY DAY 30 tablet PRN    SpO2 95%  Physical Exam  ED Course  Procedures (including critical care time)  Labs Reviewed - No data to display No results found.   No diagnosis found.    MDM   Date: 03/22/2011  Rate: 98  Rhythm: normal sinus rhythm  QRS Axis: normal  Intervals: normal  ST/T Wave abnormalities: nonspecific T wave changes  Conduction Disutrbances:none  Narrative Interpretation:   Old EKG Reviewed: unchanged from Jun 17, 2010   Work-up negative. Pt is sating at 98% on room air, no more chest pain, only when she coughs. Pt primary care is dr. Ninetta Lights, she says she can be seen this week.        Dorthula Matas, PA 03/22/11 [phone]

## 2011-03-22 NOTE — ED Notes (Signed)
Pt here with c/o cough, chest pain, fever and generalized body aches and headache that started yesterday.  Pt reports chest pain is worse with cough and deep breath and pt reports cough is productive with yellow sputum.  Pt rates pain 8/10.

## 2011-03-22 NOTE — ED Provider Notes (Signed)
Medical screening examination/treatment/procedure(s) were performed by non-physician practitioner and as supervising physician I was immediately available for consultation/collaboration.   Orren Pietsch A Sadao Weyer, MD 03/22/11 1552 

## 2011-03-22 NOTE — ED Notes (Signed)
Received report from [place], [place]; introduced self; pt getting dressed to be discharged home

## 2011-03-22 NOTE — ED Notes (Signed)
Per EMS:  Pt here with c/o chest pain that started this am.  Pt also reports body aches, fever and 1 episode of n/v last night.  Pt awoke this am with left sided chest pain that pt describes as non radiating pressure and rates it 9/10 initially.  Pt recvd 1 sl nitro and 4 baby aspirin and pain is now 7/10.  Pt alert and oriented x4 and in NAD.  12 lead unremarkable and pt NSR on monitor.

## 2011-03-25 ENCOUNTER — Ambulatory Visit (INDEPENDENT_AMBULATORY_CARE_PROVIDER_SITE_OTHER): Payer: Medicaid Other | Admitting: Internal Medicine

## 2011-03-25 ENCOUNTER — Encounter: Payer: Self-pay | Admitting: Internal Medicine

## 2011-03-25 ENCOUNTER — Other Ambulatory Visit (HOSPITAL_COMMUNITY)
Admission: RE | Admit: 2011-03-25 | Discharge: 2011-03-25 | Disposition: A | Discharge status: Home / Self Care | Payer: Medicaid Other | Source: Ambulatory Visit | Attending: Internal Medicine | Admitting: Internal Medicine

## 2011-03-25 VITALS — BP 137/92 | HR 67 | Temp 97.3°F | Wt 143.0 lb

## 2011-03-25 DIAGNOSIS — Z299 Encounter for prophylactic measures, unspecified: Secondary | ICD-10-CM

## 2011-03-25 DIAGNOSIS — Z01419 Encounter for gynecological examination (general) (routine) without abnormal findings: Secondary | ICD-10-CM | POA: Insufficient documentation

## 2011-03-25 DIAGNOSIS — N898 Other specified noninflammatory disorders of vagina: Secondary | ICD-10-CM

## 2011-03-25 MED ORDER — METRONIDAZOLE 500 MG PO TABS: 500.0000 mg | ORAL_TABLET | Freq: Two times a day (BID) | ORAL | Status: AC

## 2011-03-25 NOTE — Patient Instructions (Signed)
Bacterial Vaginosis Bacterial vaginosis (BV) is a vaginal infection where the normal balance of bacteria in the vagina is disrupted. The normal balance is then replaced by an overgrowth of certain bacteria. There are several different kinds of bacteria that can cause BV. BV is the most common vaginal infection in women of childbearing age. CAUSES   The cause of BV is not fully understood. BV develops when there is an increase or imbalance of harmful bacteria.   Some activities or behaviors can upset the normal balance of bacteria in the vagina and put women at increased risk including:   Having a new sex partner or multiple sex partners.   Douching.   Using an intrauterine device (IUD) for contraception.   It is not clear what role sexual activity plays in the development of BV. However, women that have never had sexual intercourse are rarely infected with BV.  Women do not get BV from toilet seats, bedding, swimming pools or from touching objects around them.  SYMPTOMS   Grey vaginal discharge.   A fish-like odor with discharge, especially after sexual intercourse.   Itching or burning of the vagina and vulva.   Burning or pain with urination.   Some women have no signs or symptoms at all.  DIAGNOSIS  Your caregiver must examine the vagina for signs of BV. Your caregiver will perform lab tests and look at the sample of vaginal fluid through a microscope. They will look for bacteria and abnormal cells (clue cells), a pH test higher than 4.5, and a positive amine test all associated with BV.  RISKS AND COMPLICATIONS   Pelvic inflammatory disease (PID).   Infections following gynecology surgery.   Developing HIV.   Developing herpes virus.  TREATMENT  Sometimes BV will clear up without treatment. However, all women with symptoms of BV should be treated to avoid complications, especially if gynecology surgery is planned. Female partners generally do not need to be treated. However,  BV may spread between female sex partners so treatment is helpful in preventing a recurrence of BV.   BV may be treated with antibiotics. The antibiotics come in either pill or vaginal cream forms. Either can be used with nonpregnant or pregnant women, but the recommended dosages differ. These antibiotics are not harmful to the baby.   BV can recur after treatment. If this happens, a second round of antibiotics will often be prescribed.   Treatment is important for pregnant women. If not treated, BV can cause a premature delivery, especially for a pregnant woman who had a premature birth in the past. All pregnant women who have symptoms of BV should be checked and treated.   For chronic reoccurrence of BV, treatment with a type of prescribed gel vaginally twice a week is helpful.  HOME CARE INSTRUCTIONS   Finish all medication as directed by your caregiver.   Do not have sex until treatment is completed.   Tell your sexual partner that you have a vaginal infection. They should see their caregiver and be treated if they have problems, such as a mild rash or itching.   Practice safe sex. Use condoms. Only have 1 sex partner.  PREVENTION  Basic prevention steps can help reduce the risk of upsetting the natural balance of bacteria in the vagina and developing BV:  Do not have sexual intercourse (be abstinent).   Do not douche.   Use all of the medicine prescribed for treatment of BV, even if the signs and symptoms go away.     Tell your sex partner if you have BV. That way, they can be treated, if needed, to prevent reoccurrence.  SEEK MEDICAL CARE IF:   Your symptoms are not improving after 3 days of treatment.   You have increased discharge, pain, or fever.  MAKE SURE YOU:   Understand these instructions.   Will watch your condition.   Will get help right away if you are not doing well or get worse.  FOR MORE INFORMATION  Division of STD Prevention (DSTDP), Centers for Disease  Control and Prevention: www.cdc.gov/std American Social Health Association (ASHA): www.ashastd.org  Document Released: 04/11/2005 Document Revised: 12/22/2010 Document Reviewed: 10/02/2008 Exit[employer] Patient Information 2012 [employer]. 

## 2011-03-26 LAB — WET PREP BY MOLECULAR PROBE
Candida species: NEGATIVE
Trichomonas vaginosis: POSITIVE — AB

## 2011-03-31 DIAGNOSIS — N898 Other specified noninflammatory disorders of vagina: Secondary | ICD-10-CM | POA: Insufficient documentation

## 2011-03-31 NOTE — Assessment & Plan Note (Signed)
Given the typical history and exam suggestive of whitish discharge. Bacterial vaginosis is the most likely presumptive diagnosis. I will treat with flagyl for 7 days.

## 2011-03-31 NOTE — Progress Notes (Signed)
  Subjective:    [name], female    DOB: [birth year], 50 y.o.   MRN: [id number]  HPI [name] [name] is a 50 year old female with past medical history most significant for HIV, hepatitis C, depression disorder and multiple other medical problems as noted in the chart is here today for vaginal discharge. Patient's vaginal discharge started one week ago. Patient is still sexually active.  No complaints of dysuria or abdominal discomfort at this time.  Review of Systems  Constitutional: Negative for fever, activity change and appetite change.  HENT: Negative for sore throat.   Respiratory: Negative for cough and shortness of breath.   Cardiovascular: Negative for chest pain and leg swelling.  Gastrointestinal: Negative for nausea, abdominal pain, diarrhea, constipation and abdominal distention.  Genitourinary: Positive for vaginal discharge. Negative for frequency, hematuria and difficulty urinating.  Neurological: Negative for dizziness and headaches.  Psychiatric/Behavioral: Negative for suicidal ideas and behavioral problems.       Objective:   Physical Exam  Constitutional: She is oriented to person, place, and time. She appears well-developed and well-nourished.  HENT:  Head: Normocephalic and atraumatic.  Eyes: Conjunctivae and EOM are normal. Pupils are equal, round, and reactive to light. No scleral icterus.  Neck: Normal range of motion. Neck supple. No JVD present. No thyromegaly present.  Cardiovascular: Normal rate, regular rhythm, normal heart sounds and intact distal pulses.  Exam reveals no gallop and no friction rub.   No murmur heard. Pulmonary/Chest: Effort normal and breath sounds normal. No respiratory distress. She has no wheezes. She has no rales.  Abdominal: Soft. Bowel sounds are normal. She exhibits no distension and no mass. There is no tenderness. There is no rebound and no guarding.  Genitourinary:       A complete pelvic exam was done, a Pap  smear, wet prep and GC chlamydia was sent. There was thick white discharge noted with foul odor. No tenderness noted on bimanual exam.    Musculoskeletal: Normal range of motion. She exhibits no edema and no tenderness.  Lymphadenopathy:    She has no cervical adenopathy.  Neurological: She is alert and oriented to person, place, and time.  Psychiatric: She has a normal mood and affect. Her behavior is normal.          Assessment & Plan:

## 2011-04-15 ENCOUNTER — Ambulatory Visit (INDEPENDENT_AMBULATORY_CARE_PROVIDER_SITE_OTHER): Payer: Medicaid Other | Admitting: Internal Medicine

## 2011-04-15 ENCOUNTER — Encounter: Payer: Self-pay | Admitting: Internal Medicine

## 2011-04-15 VITALS — BP 134/88 | HR 95 | Temp 96.9°F | Ht 61.0 in | Wt 143.7 lb

## 2011-04-15 DIAGNOSIS — E876 Hypokalemia: Secondary | ICD-10-CM

## 2011-04-15 DIAGNOSIS — I10 Essential (primary) hypertension: Secondary | ICD-10-CM

## 2011-04-15 LAB — BASIC METABOLIC PANEL
Chloride: 101 mEq/L (ref 96–112)
Glucose, Bld: 112 mg/dL — ABNORMAL HIGH (ref 70–99)
Potassium: 3.1 mEq/L — ABNORMAL LOW (ref 3.5–5.3)
Sodium: 141 mEq/L (ref 135–145)

## 2011-04-15 NOTE — Assessment & Plan Note (Signed)
BP stable and well controlled.  Will stop HCTZ today as this may be the cause of her persistent hypokalemia.  I will have her return for f/u on Jan 4.  If her BP is elevated above goal, may need to add a second agent.

## 2011-04-15 NOTE — Assessment & Plan Note (Signed)
Will check BMET today.  Workup thus far has been unrevealing.  Hypokalemia may be the result of HCTZ.  Will d/c HCTZ today and have pt return next week for blood draw.  She is also advised to stop taking KCL supplements for now. I will call her to discuss the results of her blood draw. If she is still hypokalemic next week, will have her resume oral supplementation until her f/u OV on Apr 29 2011.

## 2011-04-15 NOTE — Patient Instructions (Signed)
Schedule a follow up visit with Dr. Arvilla Market on Friday Apr 29 2011. STOP taking HCTZ. STOP taking potassium. Please come back next week for a blood draw. I will call you to discuss the result of your blood work next week. Have a very Merry Christmas and Happy New Year! Have fun with the kids!!!

## 2011-04-15 NOTE — Progress Notes (Signed)
[name], female   DOB: [birth year], 50 y.o.   MRN: [id number]  Subjective:    HPI Please see the A&P for the status of the pt's chronic medical problems and acute medical complaints.   Review of Systems  Constitutional: Negative for fever, chills, diaphoresis, activity change, appetite change, fatigue and unexpected weight change.  HENT: Negative for hearing loss, congestion and neck stiffness.   Eyes: Negative for photophobia, pain and visual disturbance.  Respiratory: Negative for cough, chest tightness, shortness of breath and wheezing.   Cardiovascular: Negative for chest pain and palpitations.  Gastrointestinal: Negative for nausea, vomiting, abdominal pain, diarrhea, blood in stool and anal bleeding.  Genitourinary: Negative for dysuria, urgency, frequency, hematuria, flank pain, decreased urine volume, vaginal bleeding, vaginal discharge, difficulty urinating, genital sores, vaginal pain and pelvic pain.  Musculoskeletal: Negative for joint swelling.  Neurological: Negative for dizziness, syncope, speech difficulty, weakness, numbness and headaches.       Objective:   Physical Exam  VItal signs reviewed and stable. GEN: No apparent distress.  Alert and oriented x 3.  Pleasant, conversant, and cooperative to exam. HEENT: head is autraumatic and normocephalic.  Neck is supple without palpable masses or lymphadenopathy.  No JVD or carotid bruits.  Vision intact.  EOMI.  PERRLA.  Sclerae anicteric.  Conjunctivae without pallor or injection. Mucous membranes are moist.  Oropharynx is without erythema, exudates, or other abnormal lesions.  Dentition is fair.  No facial swelling or erythema. RESP:  Lungs are clear to ascultation bilaterally with good air movement.  No wheezes, ronchi, or rubs. CARDIOVASCULAR: regular rate, normal rhythm.  Clear S1, S2, no murmurs, gallops, or rubs. ABDOMEN: soft, non-tender, non-distended.  Bowels sounds present in all quadrants and  normoactive.  No palpable masses. EXT: warm and dry.  Peripheral pulses equal, intact, and +2 globally.  No clubbing or cyanosis. No edema in bilateral lower extremities. SKIN: warm and dry with normal turgor.  No rashes or abnormal lesions observed. NEURO: CN II-XII grossly intact.  Muscle strength +5/5 in bilateral upper and lower extremities.  Sensation is grossly intact.  No focal deficit.       Assessment & Plan:

## 2011-04-21 ENCOUNTER — Other Ambulatory Visit: Payer: Self-pay | Admitting: Infectious Diseases

## 2011-04-21 ENCOUNTER — Other Ambulatory Visit (INDEPENDENT_AMBULATORY_CARE_PROVIDER_SITE_OTHER): Payer: Medicaid Other

## 2011-04-21 ENCOUNTER — Other Ambulatory Visit: Payer: Self-pay | Admitting: Internal Medicine

## 2011-04-21 DIAGNOSIS — I10 Essential (primary) hypertension: Secondary | ICD-10-CM

## 2011-04-21 DIAGNOSIS — B20 Human immunodeficiency virus [HIV] disease: Secondary | ICD-10-CM

## 2011-04-21 LAB — BASIC METABOLIC PANEL
BUN: 13 mg/dL (ref 6–23)
CO2: 27 mEq/L (ref 19–32)
Glucose, Bld: 110 mg/dL — ABNORMAL HIGH (ref 70–99)
Potassium: 2.6 mEq/L — CL (ref 3.5–5.3)
Sodium: 141 mEq/L (ref 135–145)

## 2011-04-21 MED ORDER — POTASSIUM CHLORIDE CRYS ER 20 MEQ PO TBCR: EXTENDED_RELEASE_TABLET | ORAL | Status: DC

## 2011-04-21 NOTE — Progress Notes (Signed)
Called pt and she has kcl 20 meq tabs at home.   I had her talk with Dr Phillips Odor and pt voices understanding of instructions. She has been off HCTZ for 3 weeks.  Appointment scheduled for 1/4

## 2011-04-21 NOTE — Progress Notes (Signed)
Critical lab called 12/27 K is 2.6. Called in PO K to be taken today-needs F/U appointment next week-next available for repeat labs. Instruct patient to stop taking HCTZ. At next visit can discuss K sparing diuretic for BP control. BP not critically high or in need of urgent control.

## 2011-04-29 ENCOUNTER — Encounter: Payer: Self-pay | Admitting: Internal Medicine

## 2011-04-29 ENCOUNTER — Ambulatory Visit (INDEPENDENT_AMBULATORY_CARE_PROVIDER_SITE_OTHER): Payer: Medicaid Other | Admitting: Internal Medicine

## 2011-04-29 VITALS — BP 138/87 | HR 89 | Temp 97.3°F | Ht 61.0 in | Wt 143.4 lb

## 2011-04-29 DIAGNOSIS — R0789 Other chest pain: Secondary | ICD-10-CM

## 2011-04-29 DIAGNOSIS — E876 Hypokalemia: Secondary | ICD-10-CM

## 2011-04-29 LAB — BASIC METABOLIC PANEL
BUN: 13 mg/dL (ref 6–23)
CO2: 25 mEq/L (ref 19–32)
Calcium: 9.6 mg/dL (ref 8.4–10.5)
Creat: 0.78 mg/dL (ref 0.50–1.10)
Glucose, Bld: 95 mg/dL (ref 70–99)

## 2011-04-29 LAB — MAGNESIUM: Magnesium: 2.3 mg/dL (ref 1.5–2.5)

## 2011-04-29 NOTE — Assessment & Plan Note (Signed)
As outlined in history of present illness, patient was seen on 04/15/11 with a potassium of 3.1, with subsequent potassium of 2.6 on 04/20/11. She was instructed over the phone to take potassium 80 for one day, 40 the next day, and 20 per day thereafter. She has been on 20 per day up until now. We will recheck potassium and magnesium levels today in clinic.  4:00 PM Stat labs came back with a potassium of 3.4, and a normal magnesium level. Given this, however the patient take of potassium for 3 more days, and then take nothing until our followup appointment in one week's time when we will recheck again. The patient should not have potassium supplements indefinitely without a diuretic, unless we can determine the cause of her hypokalemia aside from the now discontinued HCTZ.

## 2011-04-29 NOTE — Assessment & Plan Note (Signed)
Patient complaining of a shock-like substernal chest discomfort that lasts for a few seconds at a time, that seems to be exertional in nature, and has been coming on over the past few days. The patient says it feels like she is I nerve stimulator up to her chest, and that it is turned on for just a few seconds at a time. She does have a history of atypical chest pain, and a negative cardiac workup back in 2007. A lot of her symptoms of leg fatigue, these feelings could represent manifestations of her hypokalemia, but they are very nonspecific. Given her potassium came back at 3.4 today, advised patient to pay attention to any exacerbating or alleviating factors, and we will reassess next week on Friday when we followup her potassium values.

## 2011-04-29 NOTE — Progress Notes (Signed)
Subjective:     [name], female   DOB: [birth year], 51 y.o.   MRN: [id number]  HPI Patient is a very pleasant 51 year old female with HIV, hepatitis C, hypertension, and recurrent hypokalemia which has been managed over the past couple weeks. Patient was seen on 04/15/11 and had a low potassium in the setting of HCTZ use. She was instructed to stop taking HCTZ and to stop taking potassium, and to recheck the potassium. 5 days later, on 04/20/11, her potassium was noted to be 2.6. Patient was called at that time and told to take 80 mEq, then 40, then 20 daily until her followup appointment today.  Patient has been taking potassium 20 meq daily since, and complains of some fatigue and weakness in her legs. Also complains of intermittent episodes of chest discomfort that she describes as a tingling, "shocklike" sensation that occurred near the midline for a few seconds at a time and then disappear. She was seen in the emergency room in November for chest pressure and shortness of breath in the setting of viral illness, and she says this discomfort feels different from that.  She had a reported negative treadmill stress test a few years ago, though I do not see it in the chart (I see a note from [facility] in 2007 saying she had a negative myoview previously to that date). Her symptoms are provoked by walking.  She denies palpitations or SOB.  Review of Systems As per history of present illness    Objective:   Physical Exam Filed Vitals:   04/29/11 1400  BP: 138/87  Pulse: 89  Temp: 97.3 F (36.3 C)   GEN: NAD.  Alert and oriented x 3.  Pleasant, conversant, and cooperative to exam. RESP:  CTAB, no w/r/r CARDIOVASCULAR: RRR, S1, S2, 3/6 HSM across precordium, no r/g ABDOMEN: soft, NT/ND, NABS EXT: warm and dry. No edema in b/l LE     Assessment:         Plan:

## 2011-04-29 NOTE — Assessment & Plan Note (Deleted)
As outlined in history of present illness, patient was seen on 04/15/11 with a potassium of 3.1, with subsequent potassium of 2.6 on 04/20/11. She was instructed over the phone to take potassium 80 for one day, 40 the next day, and 20 per day thereafter. She has been on 20 per day up until now. We will recheck potassium and magnesium levels today in clinic.  4:00 PM Stat labs came back with a potassium of 3.4, and a normal magnesium level. Given this, however the patient take 40meq of potassium for 3 more days, and then take nothing until our followup appointment in one week's time when we will recheck again. The patient should not have potassium supplements indefinitely without a diuretic, unless we can determine the cause of her hypokalemia aside from the now discontinued HCTZ.  

## 2011-05-02 ENCOUNTER — Other Ambulatory Visit (INDEPENDENT_AMBULATORY_CARE_PROVIDER_SITE_OTHER): Payer: Medicaid Other

## 2011-05-02 ENCOUNTER — Other Ambulatory Visit: Payer: Self-pay | Admitting: Infectious Diseases

## 2011-05-02 DIAGNOSIS — B20 Human immunodeficiency virus [HIV] disease: Secondary | ICD-10-CM

## 2011-05-03 LAB — T-HELPER CELL (CD4) - (RCID CLINIC ONLY)
CD4 % Helper T Cell: 24 % — ABNORMAL LOW (ref 33–55)
CD4 T Cell Abs: 810 uL (ref 400–2700)

## 2011-05-03 LAB — COMPREHENSIVE METABOLIC PANEL
ALT: 21 U/L (ref 0–35)
AST: 17 U/L (ref 0–37)
Alkaline Phosphatase: 105 U/L (ref 39–117)
BUN: 10 mg/dL (ref 6–23)
Calcium: 9.5 mg/dL (ref 8.4–10.5)
Creat: 0.63 mg/dL (ref 0.50–1.10)
Total Bilirubin: 0.5 mg/dL (ref 0.3–1.2)

## 2011-05-03 LAB — CBC WITH DIFFERENTIAL/PLATELET
Basophils Relative: 0 % (ref 0–1)
Eosinophils Relative: 1 % (ref 0–5)
HCT: 39.9 % (ref 36.0–46.0)
Hemoglobin: 12.9 g/dL (ref 12.0–15.0)
MCH: 26.9 pg (ref 26.0–34.0)
MCHC: 32.3 g/dL (ref 30.0–36.0)
MCV: 83.1 fL (ref 78.0–100.0)
Monocytes Absolute: 0.6 10*3/uL (ref 0.1–1.0)
Monocytes Relative: 6 % (ref 3–12)
Neutro Abs: 6.8 10*3/uL (ref 1.7–7.7)

## 2011-05-04 LAB — HIV-1 RNA QUANT-NO REFLEX-BLD: HIV-1 RNA Quant, Log: <1.3 {Log} (ref <1.30)

## 2011-05-06 ENCOUNTER — Encounter: Payer: Self-pay | Admitting: Internal Medicine

## 2011-05-06 ENCOUNTER — Ambulatory Visit (INDEPENDENT_AMBULATORY_CARE_PROVIDER_SITE_OTHER): Payer: Medicaid Other | Admitting: Internal Medicine

## 2011-05-06 VITALS — BP 138/90 | HR 81 | Temp 97.5°F | Ht 61.0 in | Wt 143.6 lb

## 2011-05-06 DIAGNOSIS — E876 Hypokalemia: Secondary | ICD-10-CM

## 2011-05-06 LAB — BASIC METABOLIC PANEL WITH GFR
BUN: 11 mg/dL (ref 6–23)
CO2: 26 mEq/L (ref 19–32)
Calcium: 9.1 mg/dL (ref 8.4–10.5)
Creat: 0.79 mg/dL (ref 0.50–1.10)

## 2011-05-06 NOTE — Assessment & Plan Note (Addendum)
As we had planned, the patient took potassium on Friday, Saturday, Sunday, and Monday. She had her labs checked for Dr. Ninetta Lights on Monday, with a K. of 3.5. She has not taken any potassium since then, as instructed. We will recheck her potassium today. Depending on the results, we will decide how to proceed.  3:51 PM Potassium has returned at 2.9. The patient has dropped from 3.5 to 2.9 over four days while not on potassium. In a patient with hypertension and hypokalemia, common causes include hyperaldosteronism, renovascular disease, and diuretic use. She reassured me she is not taking any HCTZ, and I'm concerned that there may be an underlying pathology behind her hypokalemia. First up would be to start working possible hyperaldosteronism.  I called the patient to discuss these results with her. I told her to take 40 of K. today, 40 tomorrow, and 20 daily thereafter. She will come back to [facility] on Monday morning for an aldosterone and renin activity blood draw. She has a followup appointment with her PMD Dr. Arvilla Market on February 1, which should be good timing given that these results will take some time to return. If the results indicate excess aldosterone, a confirmatory test may be indicated. On the other hand, if they are normal, a 24 hour urine collection for potassium should be considered. Up-to-date has section entitled "hypertension and hypokalemia" that can be referenced. We will also check a BMP on Monday to make sure her potassium has normalized. - Aldosterone and renin levels Monday morning - BMP Monday morning - Followup on 2/1 with Dr. Arvilla Market or sooner pending lab results - Consider further labs prior to followup appointment if indicated to expedite workup - I sent a message to Dr. Ninetta Lights, asking him if any of her HIV meds could contribute to hypokalemia

## 2011-05-06 NOTE — Progress Notes (Signed)
Subjective:     [name], female   DOB: 25-Oct-1960, 51 y.o.   MRN: [id number]  HPI Is a very pleasant 51 year old woman who is here for followup of hypokalemia. She was seen here one week ago and found to have a potassium of 3.4 after having a potassium of 2.6 two weeks prior. She presents today for potassium recheck, and reports she is feeling quite well. Last week she had some nonspecific and atypical chest pains, which have resolved. She was also having some leg weakness with "noodle feeling", which is also resolved. She has no complaints today. She got labs drawn for Dr. Ninetta Lights yesterday.  Review of Systems Denies vomiting or diarrhea    Objective:   Physical Exam Gen: NAD, cooperative HEENT: NCAT, EOMI, anicteric Neuro: ambulating w/o difficulty, communicating freely    Assessment:         Plan:

## 2011-05-09 ENCOUNTER — Other Ambulatory Visit (INDEPENDENT_AMBULATORY_CARE_PROVIDER_SITE_OTHER): Payer: Medicaid Other

## 2011-05-09 DIAGNOSIS — E876 Hypokalemia: Secondary | ICD-10-CM

## 2011-05-09 LAB — BASIC METABOLIC PANEL WITH GFR
BUN: 9 mg/dL (ref 6–23)
Calcium: 9.2 mg/dL (ref 8.4–10.5)
GFR, Est African American: >89 mL/min
Glucose, Bld: 99 mg/dL (ref 70–99)
Sodium: 139 mEq/L (ref 135–145)

## 2011-05-11 ENCOUNTER — Other Ambulatory Visit: Payer: Self-pay | Admitting: *Deleted

## 2011-05-11 NOTE — Telephone Encounter (Signed)
Pharmacy is requesting a refill on HCTZ 25 mg 1 tablet daily.  Dispense # 90.  Angelina Ok, RN 05/11/2011 2:14 PM

## 2011-05-12 MED ORDER — CETIRIZINE HCL 10 MG PO TABS: 10.0000 mg | ORAL_TABLET | Freq: Every day | ORAL | Status: DC | PRN

## 2011-05-12 MED ORDER — TRAMADOL HCL 50 MG PO TABS: ORAL_TABLET | ORAL | Status: DC

## 2011-05-13 LAB — ALDOSTERONE + RENIN ACTIVITY W/ RATIO: ALDO / PRA Ratio: 18.3 Ratio (ref 0.9–28.9)

## 2011-05-16 ENCOUNTER — Ambulatory Visit (INDEPENDENT_AMBULATORY_CARE_PROVIDER_SITE_OTHER): Payer: Medicaid Other | Admitting: Infectious Diseases

## 2011-05-16 ENCOUNTER — Ambulatory Visit: Payer: Medicaid Other | Admitting: Infectious Diseases

## 2011-05-16 ENCOUNTER — Encounter: Payer: Self-pay | Admitting: Infectious Diseases

## 2011-05-16 DIAGNOSIS — E876 Hypokalemia: Secondary | ICD-10-CM

## 2011-05-16 DIAGNOSIS — B20 Human immunodeficiency virus [HIV] disease: Secondary | ICD-10-CM

## 2011-05-16 DIAGNOSIS — E785 Hyperlipidemia, unspecified: Secondary | ICD-10-CM

## 2011-05-16 DIAGNOSIS — Z113 Encounter for screening for infections with a predominantly sexual mode of transmission: Secondary | ICD-10-CM

## 2011-05-16 DIAGNOSIS — Z79899 Other long term (current) drug therapy: Secondary | ICD-10-CM

## 2011-05-16 NOTE — Assessment & Plan Note (Signed)
Will get her IM f/u ASAP. My great appreciation to them for partnering with [place].

## 2011-05-16 NOTE — Assessment & Plan Note (Signed)
Have her seen by the lipid study coordinator.

## 2011-05-16 NOTE — Progress Notes (Signed)
  Subjective:    [name], female    DOB: 05-Apr-1961, 51 y.o.   MRN: [id number]  HPI 51 yo F with hx of Hep C and HIV+. She had nasal septoplasty 01-05-10. Has bulging disc but is getting conservative rx for this.  Has been having difficulty with hypoKalemia. Has been seen by Dr Berlinda Last and Dr Arvilla Market. Has affected her energy level, her ability to exercise, dizzy spells, has had CP with exertion (vacuuming). Muscle cramps. "My blood pressure is excellent".  CP reolves with rest, lasts only seconds. Feels like a shock.  No prob with ART.    Her last CD4 810 and VL <20 (05-09-11). Pap normal 05-20-10.   Review of Systems  Constitutional: Negative for appetite change and unexpected weight change.  Respiratory: Negative for cough and shortness of breath.   Cardiovascular: Positive for chest pain and leg swelling.       Occas LE swelling.   Gastrointestinal: Negative for diarrhea and constipation.  Genitourinary: Negative for dysuria.       Objective:   Physical Exam  Constitutional: She appears well-developed and well-nourished.  Eyes: EOM are normal. Pupils are equal, round, and reactive to light.  Neck: Neck supple.  Cardiovascular: Normal rate, regular rhythm and normal heart sounds.   Pulmonary/Chest: Effort normal and breath sounds normal.  Abdominal: Soft. Bowel sounds are normal. There is no tenderness.  Lymphadenopathy:    She has no cervical adenopathy.          Assessment & Plan:

## 2011-05-16 NOTE — Assessment & Plan Note (Signed)
She is doing well. Will cont her current rx. Wonder if TFV is contributing to K+ wasting (not typical). She is given condoms today. vax are up to date. See her back in 5-6 months.

## 2011-05-17 ENCOUNTER — Telehealth: Payer: Self-pay | Admitting: *Deleted

## 2011-05-17 NOTE — Telephone Encounter (Signed)
Called patient and notified of appointment at Internal Medicine for 05/18/11 at 2:15 pm Wendall Mola CMA

## 2011-05-18 ENCOUNTER — Ambulatory Visit (INDEPENDENT_AMBULATORY_CARE_PROVIDER_SITE_OTHER): Payer: Medicaid Other | Admitting: Internal Medicine

## 2011-05-18 ENCOUNTER — Encounter: Payer: Self-pay | Admitting: Internal Medicine

## 2011-05-18 ENCOUNTER — Ambulatory Visit (HOSPITAL_COMMUNITY)
Admission: RE | Admit: 2011-05-18 | Discharge: 2011-05-18 | Disposition: A | Discharge status: Home / Self Care | Payer: Medicaid Other | Source: Ambulatory Visit | Attending: Internal Medicine | Admitting: Internal Medicine

## 2011-05-18 ENCOUNTER — Other Ambulatory Visit: Payer: Self-pay | Admitting: Internal Medicine

## 2011-05-18 VITALS — BP 130/86 | HR 97 | Temp 97.1°F | Ht 61.0 in | Wt 144.6 lb

## 2011-05-18 DIAGNOSIS — R5383 Other fatigue: Secondary | ICD-10-CM

## 2011-05-18 DIAGNOSIS — E876 Hypokalemia: Secondary | ICD-10-CM

## 2011-05-18 DIAGNOSIS — R079 Chest pain, unspecified: Secondary | ICD-10-CM | POA: Insufficient documentation

## 2011-05-18 DIAGNOSIS — I10 Essential (primary) hypertension: Secondary | ICD-10-CM

## 2011-05-18 DIAGNOSIS — R0789 Other chest pain: Secondary | ICD-10-CM

## 2011-05-18 LAB — TSH: TSH: 1.284 u[IU]/mL (ref 0.350–4.500)

## 2011-05-18 LAB — BASIC METABOLIC PANEL
CO2: 28 mEq/L (ref 19–32)
Calcium: 8.9 mg/dL (ref 8.4–10.5)
Creat: 0.62 mg/dL (ref 0.50–1.10)
Glucose, Bld: 89 mg/dL (ref 70–99)
Sodium: 139 mEq/L (ref 135–145)

## 2011-05-18 MED ORDER — LOSARTAN POTASSIUM 50 MG PO TABS: 50.0000 mg | ORAL_TABLET | Freq: Every day | ORAL | Status: DC

## 2011-05-18 MED ORDER — LISINOPRIL 20 MG PO TABS: 20.0000 mg | ORAL_TABLET | Freq: Every day | ORAL | Status: DC

## 2011-05-18 NOTE — Progress Notes (Signed)
Subjective:   [name] female   DOB: 10-10-1960 51 y.o.   MRN: [id number]  HPI: [name] [name] is a 51 y.o.  Female with PMH significant as outlined below who presented to the clinic for a follow up of Hypokalemia. During the last office visit patient was found to have K of 2.9 and was prescribed KCL 40 meq daily for 2 days and then 20 meq then after but patient took her last dose yesterday and did not continue. She further noted that she has been very tired and having low energy. She denies any weight changes, fevers or chill, recent changes in medication, depressed mood.     Past Medical History  Diagnosis Date  . HIV infection     1994  . Hepatitis C   . Hypertension   . GERD (gastroesophageal reflux disease)   . Allergy   . Depression   . CHF (congestive heart failure)   . Diverticulitis   . Arthritis    Current Outpatient Prescriptions  Medication Sig Dispense Refill  . albuterol (VENTOLIN HFA) 108 (90 BASE) MCG/ACT inhaler Inhale 1 puff into the lungs every 6 (six) hours as needed. Take 1-2 puffs every 6 hrs as needed  1 Inhaler  6  . cetirizine (ZYRTEC) 10 MG tablet Take 1 tablet (10 mg total) by mouth daily as needed. For allergies  90 tablet  3  . emtricitabine-tenofovir (TRUVADA) 200-300 MG per tablet Take 1 tablet by mouth daily.        Marland Kitchen HYDROcodone-acetaminophen (LORTAB) 10-500 MG per tablet Take 1 tablet by mouth every 6 (six) hours as needed. For pain       . LEXIVA 700 MG tablet TAKE 2 TABLETS BY MOUTH DAILY  60 tablet  PRN  . losartan (COZAAR) 50 MG tablet Take 1 tablet (50 mg total) by mouth daily.  30 tablet  1  . montelukast (SINGULAIR) 10 MG tablet Take 1 tablet (10 mg total) by mouth daily.  90 tablet  2  . NORVIR 100 MG TABS TAKE 2 TABLETS BY MOUTH DAILY  60 tablet  PRN  . Olopatadine HCl (PATADAY) 0.2 % SOLN Apply 1 drop to eye daily as needed. For allergies       . pantoprazole (PROTONIX) 20 MG tablet Take 1 tablet (20 mg total) by mouth 2  (two) times daily.  180 tablet  4  . pravastatin (PRAVACHOL) 40 MG tablet Take 1 tablet (40 mg total) by mouth daily.  180 tablet  0  . traMADol (ULTRAM) 50 MG tablet Take 100mg  (2 tablets) by mouth every 4-6 hours as needed for pain.  90 tablet  3   Family History  Problem Relation Age of Onset  . Heart disease Mother   . Diabetes Mother   . Stroke Mother   . Heart disease Father   . Stroke Father    History   Social History  . Marital Status: Single    Spouse Name: N/A    Number of Children: 0  . Years of Education: N/A   Social History Main Topics  . Smoking status: Former Smoker    Types: Cigarettes    Quit date: 04/26/2007  . Smokeless tobacco: None   Comment: QUIT 2009  . Alcohol Use: No  . Drug Use: No  . Sexually Active: Yes -- Female partner(s)     pt. given condoms   Other Topics Concern  . None   Social History Narrative   Alternate #  [phone]   Review of Systems: Constitutional: Denies fever, chills, diaphoresis, appetite change but noted  fatigue.  Respiratory: Denies SOB, DOE, cough, chest tightness,  and wheezing.   Cardiovascular: Denies chest pain, palpitations and leg swelling.  Gastrointestinal: Denies nausea, vomiting, abdominal pain, diarrhea, constipation, blood in stool and abdominal distention.  Genitourinary: Noted significant frequency but denies dysuria, urgency,  hematuria, flank pain and difficulty urinating.  Musculoskeletal: Noted myalgias,  Skin: Denies pallor, rash and wound.  Neurological: Denies dizziness, seizures, syncope, weakness, light-headedness, numbness and headaches.  Hematological: Denies adenopathy. Easy bruising. Psychiatric/Behavioral: Denies suicidal ideation, mood changes, confusion, nervousness,  Objective:  Physical Exam: Filed Vitals:   05/18/11 1429  BP: 130/86  Pulse: 97  Temp: 97.1 F (36.2 C)  TempSrc: Oral  Height: 5\' 1"  (1.549 m)  Weight: 144 lb 9.6 oz (65.59 kg)   Constitutional: Vital signs  reviewed.  Patient is a well-developed and well-nourished  in no acute distress and cooperative with exam. Alert and oriented x3.  Mouth: no erythema or exudates, MMM Neck: Supple, No mass, thyromegaly. Cardiovascular: RRR, S1 normal, S2 normal,pulses symmetric and intact bilaterally Pulmonary/Chest: CTAB, no wheezes, rales, or rhonchi Abdominal: Soft. Non-tender, non-distended, bowel sounds are normal GU: no CVA tenderness Musculoskeletal: No joint deformities, erythema, or stiffness, ROM full and no nontender Hematology: no cervical, Neurological: A&O x3, Strenght is normal and symmetric bilaterally, cranial nerve II-XII are grossly intact, no focal motor deficit, sensory intact to light touch bilaterally.  Skin: Warm, dry and intact. No rash, cyanosis, or clubbing.  Psychiatric: Normal mood and affect. speech and behavior is normal.

## 2011-05-19 MED ORDER — POTASSIUM CHLORIDE CRYS ER 20 MEQ PO TBCR: 20.0000 meq | EXTENDED_RELEASE_TABLET | Freq: Two times a day (BID) | ORAL | Status: DC

## 2011-05-19 NOTE — Assessment & Plan Note (Signed)
K of 3.0 . Will restart KCl 20 meq daily and reevaluate during next office visit . Patient is currently on Cozaar which will help to increase her potassium too Urine k is 37

## 2011-05-19 NOTE — ED Provider Notes (Signed)
Order(s) created erroneously. Erroneous order ID: [id number] Order moved by: Durwin Nora Order move date/time: 05/19/2011  2:21 PM Source Patient:    [id number] Source Contact: 05/18/2011 Destination Patient:   [id number] Destination Contact: 02/28/2011

## 2011-05-21 NOTE — Assessment & Plan Note (Signed)
Blood pressure controlled. I will d/c Norvasc and start patient on an Cozaar since ARB can elevate potassium considering patient history of Hypokalemia . Patient experienced a cough with ACEi.   Repeat Bmet recommended since patient on KCL 20 meq to monitor potassium level.

## 2011-05-21 NOTE — Assessment & Plan Note (Addendum)
Unclear etiology.DD include thyroid disease, Medication side effect, Mood disorder although patient denies depressed state at this point and Hepatitis C ( patient is currently not any therapy). HIV is well controlled with CD 4 of 810 and Viral load <20 on 05/02/2011.  I will obtain TSH and manage accordingly.   Update: TSH within normal limits. Patient's HIV medication can cause fatigue but I am wondering if patient's history of Hepatitis C is contributing her symptoms. Most chronic carrier are asymptomatic but if they are symptomatic they complain about unspecific symptoms including fatigue, anorexia, myalgia, arthralgia, weakness. Please reevaluate patient during next office visit. LFT is within normal limits.

## 2011-05-21 NOTE — Assessment & Plan Note (Addendum)
A work up was initiated for possible Hyperaldoseronism but levels were within normal limits. Further DD include urinary wasting ( polyuria due to psychogenic or central diabetes insipidus although highly unlikely since Hypokalemia present since 2007).  Furthermore renal tubular acidosis again unlikely since no acid base changes noted. Liddle's syndrome could be considered. Bartter and Gitelman Syndrome can cause Hypokalemia but usually it also causes metabolic alkalosis. We will obtain today a random urine potassium level which should be between 10-43meq/l  with normal urine output . If elevated this would show that the patient is experiencing urinary wasting.  Furthermore I will d/c Norvasc and start patient on an ARB for BP control which can elevate  Potassium . Patient experienced a cough with ACEi.   Update: K is 3.0 . Recommended patient to take KCL 20 meq daily. Need repeat Bmet next office visit to monitor potassium. Urinary potassium level 32 which is elevated.

## 2011-05-23 ENCOUNTER — Encounter: Payer: Self-pay | Admitting: Internal Medicine

## 2011-05-24 ENCOUNTER — Telehealth: Payer: Self-pay | Admitting: *Deleted

## 2011-05-24 NOTE — Telephone Encounter (Signed)
Called [phone] - approved Losartan potassium 50 mg tab till 05/23/12. Physician pharmacy aware. Stanton Kidney Steadman Prosperi RN 05/24/11 3:15PM

## 2011-05-27 ENCOUNTER — Ambulatory Visit (INDEPENDENT_AMBULATORY_CARE_PROVIDER_SITE_OTHER): Payer: Medicaid Other | Admitting: Internal Medicine

## 2011-05-27 ENCOUNTER — Encounter: Payer: Self-pay | Admitting: Internal Medicine

## 2011-05-27 VITALS — BP 160/89 | HR 98 | Temp 97.6°F | Ht 61.0 in | Wt 144.9 lb

## 2011-05-27 DIAGNOSIS — E785 Hyperlipidemia, unspecified: Secondary | ICD-10-CM

## 2011-05-27 DIAGNOSIS — E876 Hypokalemia: Secondary | ICD-10-CM

## 2011-05-27 LAB — BASIC METABOLIC PANEL
CO2: 24 mEq/L (ref 19–32)
Calcium: 9.3 mg/dL (ref 8.4–10.5)
Chloride: 103 mEq/L (ref 96–112)
Creat: 0.66 mg/dL (ref 0.50–1.10)
Potassium: 3.7 mEq/L (ref 3.5–5.3)

## 2011-05-27 MED ORDER — ROSUVASTATIN CALCIUM 10 MG PO TABS: 10.0000 mg | ORAL_TABLET | Freq: Every day | ORAL | Status: DC

## 2011-05-27 MED ORDER — AMLODIPINE BESYLATE 10 MG PO TABS: 10.0000 mg | ORAL_TABLET | Freq: Every day | ORAL | Status: DC

## 2011-05-27 MED ORDER — FLUCONAZOLE 150 MG PO TABS: 150.0000 mg | ORAL_TABLET | Freq: Once | ORAL | Status: AC

## 2011-05-27 NOTE — Patient Instructions (Signed)
Schedule a follow up appt with Dr. Arvilla Market in March. Don't take the Cozaar. Continue to take all of your other medicine (including potassium) as directed. I will call you to discuss the results of your lab work. Please call the office at [phone] with any concerns or questions. Keep up the good work!!!

## 2011-05-28 LAB — CREATININE, URINE, RANDOM: Creatinine, Urine: 214.3 mg/dL

## 2011-05-31 ENCOUNTER — Ambulatory Visit (INDEPENDENT_AMBULATORY_CARE_PROVIDER_SITE_OTHER): Payer: Medicaid Other | Admitting: Cardiovascular Disease

## 2011-05-31 ENCOUNTER — Encounter: Payer: Self-pay | Admitting: Cardiovascular Disease

## 2011-05-31 DIAGNOSIS — B20 Human immunodeficiency virus [HIV] disease: Secondary | ICD-10-CM

## 2011-05-31 DIAGNOSIS — R079 Chest pain, unspecified: Secondary | ICD-10-CM | POA: Insufficient documentation

## 2011-05-31 DIAGNOSIS — R072 Precordial pain: Secondary | ICD-10-CM | POA: Insufficient documentation

## 2011-05-31 DIAGNOSIS — I10 Essential (primary) hypertension: Secondary | ICD-10-CM

## 2011-05-31 DIAGNOSIS — R0789 Other chest pain: Secondary | ICD-10-CM

## 2011-05-31 DIAGNOSIS — E785 Hyperlipidemia, unspecified: Secondary | ICD-10-CM

## 2011-05-31 MED ORDER — POTASSIUM CHLORIDE CRYS ER 20 MEQ PO TBCR: EXTENDED_RELEASE_TABLET | ORAL | Status: DC

## 2011-05-31 NOTE — Assessment & Plan Note (Signed)
Abnormal ECG and CRF;s  F/U stress echo

## 2011-05-31 NOTE — Assessment & Plan Note (Signed)
Stable continue current antiretrovirals  F/U Ninetta Lights

## 2011-05-31 NOTE — Patient Instructions (Signed)
Your physician recommends that you schedule a follow-up appointment in: AS NEEDED  Your physician recommends that you continue on your current medications as directed. Please refer to the Current Medication list given to you today.  Your physician has requested that you have a stress echocardiogram. For further information please visit [email or web address]. Please follow instruction sheet as given. DX CHEST PAIN  

## 2011-05-31 NOTE — Assessment & Plan Note (Signed)
Well controlled.  Continue current medications and low sodium Dash type diet.    

## 2011-05-31 NOTE — Assessment & Plan Note (Signed)
Cholesterol is at goal.  Continue current dose of statin and diet Rx.  No myalgias or side effects.  F/U  LFT's in 6 months. Lab Results  Component Value Date   LDLCALC 155* 11/24/2010

## 2011-05-31 NOTE — Progress Notes (Signed)
Patient ID: ABBIEGAIL [name], female   DOB: 04/07/1961, 51 y.o.   MRN: [id number] 51 yo HTN family hisotry and previous smoker quit n 2009.  Last seen in 2007.  Having atypical chest pain.  Shocking sensations lasting seconds.  They are usually exertional.  If she keeps going they subside.  Going on over last few months.  No associated dsypnse, palpitations or syncope.  Pain persistant but not worsening.  HIV stable with CD 4 count over 800.  Sees [facility].  No atypical pneumonias.  Compliant with meds.    ROS: Denies fever, malais, weight loss, blurry vision, decreased visual acuity, cough, sputum, SOB, hemoptysis, pleuritic pain, palpitaitons, heartburn, abdominal pain, melena, lower extremity edema, claudication, or rash.  All other systems reviewed and negative   General: Affect appropriate Healthy:  appears stated age HEENT: normal Neck supple with no adenopathy JVP normal no bruits no thyromegaly Lungs clear with no wheezing and good diaphragmatic motion Heart:  S1/S2 no murmur,rub, gallop or click PMI normal Abdomen: benighn, BS positve, no tenderness, no AAA no bruit.  No HSM or HJR Distal pulses intact with no bruits No edema Neuro non-focal Skin warm and dry No muscular weakness  Medications Current Outpatient Prescriptions  Medication Sig Dispense Refill  . albuterol (VENTOLIN HFA) 108 (90 BASE) MCG/ACT inhaler Inhale 1 puff into the lungs every 6 (six) hours as needed. Take 1-2 puffs every 6 hrs as needed  1 Inhaler  6  . amLODipine (NORVASC) 10 MG tablet Take 1 tablet (10 mg total) by mouth daily.  30 tablet  6  . benzonatate (TESSALON) 100 MG capsule Take 100 mg by mouth 3 (three) times daily as needed.      . cetirizine (ZYRTEC) 10 MG tablet Take 1 tablet (10 mg total) by mouth daily as needed. For allergies  90 tablet  3  . emtricitabine-tenofovir (TRUVADA) 200-300 MG per tablet Take 1 tablet by mouth daily.        . fluticasone (FLONASE) 50 MCG/ACT nasal spray Place 2 sprays  into the nose as needed.      . hydrochlorothiazide (HYDRODIURIL) 25 MG tablet Take 25 mg by mouth daily.      Marland Kitchen HYDROcodone-acetaminophen (LORTAB) 10-500 MG per tablet Take 1 tablet by mouth every 6 (six) hours as needed. For pain       . LEXIVA 700 MG tablet TAKE 2 TABLETS BY MOUTH DAILY  60 tablet  PRN  . montelukast (SINGULAIR) 10 MG tablet Take 1 tablet (10 mg total) by mouth daily.  90 tablet  2  . NORVIR 100 MG TABS TAKE 2 TABLETS BY MOUTH DAILY  60 tablet  PRN  . Olopatadine HCl (PATADAY) 0.2 % SOLN Apply 1 drop to eye daily as needed. For allergies       . pantoprazole (PROTONIX) 20 MG tablet Take 1 tablet (20 mg total) by mouth 2 (two) times daily.  180 tablet  4  . pravastatin (PRAVACHOL) 40 MG tablet Take 40 mg by mouth daily.      . traMADol (ULTRAM) 50 MG tablet Take 100mg  (2 tablets) by mouth every 4-6 hours as needed for pain.  90 tablet  3  . potassium chloride SA (K-DUR,KLOR-CON) 20 MEQ tablet 3 TABS DAILY        Allergies Acetaminophen; Dyazide; Lisinopril; and Morphine  Family History: Family History  Problem Relation Age of Onset  . Heart disease Mother   . Diabetes Mother   . Stroke Mother   .  Heart disease Father   . Stroke Father     Social History: History   Social History  . Marital Status: Single    Spouse Name: N/A    Number of Children: 0  . Years of Education: N/A   Occupational History  . Not on file.   Social History Main Topics  . Smoking status: Former Smoker    Types: Cigarettes    Quit date: 04/26/2007  . Smokeless tobacco: Not on file   Comment: QUIT 2009  . Alcohol Use: No  . Drug Use: No  . Sexually Active: Yes -- Female partner(s)     pt. given condoms   Other Topics Concern  . Not on file   Social History Narrative   Alternate # [phone]    Electrocardiogram:  05/27/11  SR rate 98  Nonspecific ST/T wave changes borderline LVH  Assessment and Plan

## 2011-06-09 ENCOUNTER — Other Ambulatory Visit: Payer: Self-pay | Admitting: *Deleted

## 2011-06-09 MED ORDER — PANTOPRAZOLE SODIUM 20 MG PO TBEC: 20.0000 mg | DELAYED_RELEASE_TABLET | Freq: Two times a day (BID) | ORAL | Status: DC

## 2011-06-09 MED ORDER — MONTELUKAST SODIUM 10 MG PO TABS: 10.0000 mg | ORAL_TABLET | Freq: Every day | ORAL | Status: DC

## 2011-06-10 ENCOUNTER — Ambulatory Visit (HOSPITAL_BASED_OUTPATIENT_CLINIC_OR_DEPARTMENT_OTHER): Payer: Medicaid Other | Admitting: Radiology

## 2011-06-10 ENCOUNTER — Ambulatory Visit (HOSPITAL_COMMUNITY): Payer: Medicaid Other | Attending: Cardiovascular Disease | Admitting: Radiology

## 2011-06-10 DIAGNOSIS — B1920 Unspecified viral hepatitis C without hepatic coma: Secondary | ICD-10-CM | POA: Insufficient documentation

## 2011-06-10 DIAGNOSIS — R0989 Other specified symptoms and signs involving the circulatory and respiratory systems: Secondary | ICD-10-CM

## 2011-06-10 DIAGNOSIS — R5383 Other fatigue: Secondary | ICD-10-CM | POA: Insufficient documentation

## 2011-06-10 DIAGNOSIS — F329 Major depressive disorder, single episode, unspecified: Secondary | ICD-10-CM | POA: Insufficient documentation

## 2011-06-10 DIAGNOSIS — R079 Chest pain, unspecified: Secondary | ICD-10-CM | POA: Insufficient documentation

## 2011-06-10 DIAGNOSIS — Z21 Asymptomatic human immunodeficiency virus [HIV] infection status: Secondary | ICD-10-CM | POA: Insufficient documentation

## 2011-06-10 DIAGNOSIS — I10 Essential (primary) hypertension: Secondary | ICD-10-CM | POA: Insufficient documentation

## 2011-06-10 DIAGNOSIS — Z87891 Personal history of nicotine dependence: Secondary | ICD-10-CM | POA: Insufficient documentation

## 2011-06-10 DIAGNOSIS — F3289 Other specified depressive episodes: Secondary | ICD-10-CM | POA: Insufficient documentation

## 2011-06-10 DIAGNOSIS — R5381 Other malaise: Secondary | ICD-10-CM | POA: Insufficient documentation

## 2011-06-10 DIAGNOSIS — R072 Precordial pain: Secondary | ICD-10-CM

## 2011-06-10 DIAGNOSIS — E785 Hyperlipidemia, unspecified: Secondary | ICD-10-CM | POA: Insufficient documentation

## 2011-07-08 ENCOUNTER — Encounter: Payer: Self-pay | Admitting: Internal Medicine

## 2011-07-08 ENCOUNTER — Ambulatory Visit (INDEPENDENT_AMBULATORY_CARE_PROVIDER_SITE_OTHER): Payer: Medicaid Other | Admitting: Internal Medicine

## 2011-07-08 ENCOUNTER — Telehealth: Payer: Self-pay | Admitting: Internal Medicine

## 2011-07-08 VITALS — BP 128/85 | HR 92 | Temp 97.1°F | Ht 61.0 in | Wt 143.5 lb

## 2011-07-08 DIAGNOSIS — E876 Hypokalemia: Secondary | ICD-10-CM

## 2011-07-08 DIAGNOSIS — I10 Essential (primary) hypertension: Secondary | ICD-10-CM

## 2011-07-08 DIAGNOSIS — R252 Cramp and spasm: Secondary | ICD-10-CM | POA: Insufficient documentation

## 2011-07-08 DIAGNOSIS — B0229 Other postherpetic nervous system involvement: Secondary | ICD-10-CM

## 2011-07-08 DIAGNOSIS — J309 Allergic rhinitis, unspecified: Secondary | ICD-10-CM

## 2011-07-08 LAB — BASIC METABOLIC PANEL WITH GFR
Chloride: 104 mEq/L (ref 96–112)
GFR, Est African American: 83 mL/min
GFR, Est Non African American: 72 mL/min
Potassium: 3.8 mEq/L (ref 3.5–5.3)
Sodium: 140 mEq/L (ref 135–145)

## 2011-07-08 MED ORDER — CYCLOBENZAPRINE HCL 5 MG PO TABS: 5.0000 mg | ORAL_TABLET | Freq: Two times a day (BID) | ORAL | Status: AC | PRN

## 2011-07-08 MED ORDER — AMLODIPINE BESYLATE 10 MG PO TABS: 10.0000 mg | ORAL_TABLET | Freq: Every day | ORAL | Status: DC

## 2011-07-08 MED ORDER — GABAPENTIN 300 MG PO CAPS: 300.0000 mg | ORAL_CAPSULE | Freq: Three times a day (TID) | ORAL | Status: DC

## 2011-07-08 NOTE — Assessment & Plan Note (Signed)
Will check basic metabolic panel to assess electrolyte status; though I doubt her mild hypokalemia would contribute to significant muscle cramping at night. Magnesium and calcium have been within normal limits when checked in the past.  She denies any recent increase in physical activity.  Will prescribe a trial of cyclobenzaprine and see if this alleviates her symptoms.

## 2011-07-08 NOTE — Assessment & Plan Note (Signed)
Patient continues to experience pain along the distribution of her shingles outbreak consistent with postherpetic neuralgia.  Discussed use of gabapentin for treatment of neuropathic pain; will proceed with a trial of this medication for symptomatic relief. We'll follow up on her with this at her next office visit.

## 2011-07-08 NOTE — Assessment & Plan Note (Signed)
Potassium still slightly low on last bmet.  Patient reports compliance with KCl supplementation.  Her work up to establish the cause of her hypokalemia has been unrevealing so far. I still believe it may be the result of her HCTZ, but will proceed with further testing. Will check urine potassium and urine creatinine today.  Will repeat the bmet and continue all repletion of potassium for now. Will not resume HCTZ.

## 2011-07-08 NOTE — Assessment & Plan Note (Signed)
BMET obtained at her last OV revealed a K that was within normal limits.  Will repeat BMET again today.  Will have patient stop KCL supplementation and return within 1 week for repeat BMET.  If her K remains within normal limits at that time, will assume that her hypokalemia was the result of HCTZ.

## 2011-07-08 NOTE — Assessment & Plan Note (Addendum)
BP is excellent today on Norvasc alone.  We will continue with 10 mg of Norvasc as this seems to be controlling her blood pressure well.

## 2011-07-08 NOTE — Patient Instructions (Addendum)
Schedule a followup appointment with Dr. Arvilla Market in May, sooner if needed. Stop taking the potassium pills. Keep taking all of your other medicines as directed. Please come back next week for a repeat blood test to check your potassium. Cyclobenzaprine is a medicine to help relax your muscles.  Start by taking one pill before bed at night.  You can increase to 2 pills if needed for additional relief. Gabapentin is a medicine to help with the pain from your shingles.  Take 3 times a day. Try taking over-the-counter Mucinex or other form of guaifenesin for additional relief of your congestion. Have a good time tonight!!!

## 2011-07-08 NOTE — Assessment & Plan Note (Signed)
Patient is currently taking Zyrtec, Flonase, and Singulair for management of her allergic rhinitis.  She continues to have some problems with congestion.  Denies fevers, chills, hematemesis, productive cough, or other new symptom. Advised her that she could try taking over-the-counter guaifenesin; this may provide some additional relief.  Will follow up on this with her at her next office visit.

## 2011-07-08 NOTE — Progress Notes (Signed)
[name], female   DOB: 06/10/1960, 51 y.o.   MRN: [id number]  Subjective:    HPI:  Patient is here today for routine follow up.  She states she is doing well but continues to experience muscle cramping and spasm at night.  The pain occasionally wakes her from sleep. She has not noticed any other associated symptoms.  She also notes some intermittent discomfort along the distribution where she previously had shingles; she denies any new blister, vesicle, or rash formation.  She is currently taking 40 mEq of potassium daily and reports 100% adherence to all of her other medications.   Review of Systems  Constitutional: Negative for fever, chills, diaphoresis, activity change, appetite change, fatigue and unexpected weight change.  HENT: Negative for hearing loss, congestion and neck stiffness.   Eyes: Negative for photophobia, pain and visual disturbance.  Respiratory: Negative for cough, chest tightness, shortness of breath and wheezing.   Cardiovascular: Negative for chest pain and palpitations.  Gastrointestinal: Negative for nausea, vomiting, abdominal pain, diarrhea, blood in stool and anal bleeding.  Genitourinary: Negative for dysuria, urgency, frequency, hematuria, flank pain, decreased urine volume, vaginal bleeding, vaginal discharge, difficulty urinating, genital sores, vaginal pain and pelvic pain.  Musculoskeletal: Negative for joint swelling.  Neurological: Negative for dizziness, syncope, speech difficulty, weakness, numbness and headaches.       Objective:   Physical Exam  Vital signs reviewed GEN: No apparent distress.  Alert and oriented x 3.  Pleasant, conversant, and cooperative to exam. HEENT: head is autraumatic and normocephalic.  Neck is supple without palpable masses or lymphadenopathy.  No JVD or carotid bruits.  Vision intact.  EOMI.  PERRLA.  Sclerae anicteric.  Conjunctivae without pallor or injection. Mucous membranes are moist.  Oropharynx is  without erythema, exudates, or other abnormal lesions.  Dentition is good.  No facial swelling or erythema. RESP:  Lungs are clear to ascultation bilaterally with good air movement.  No wheezes, ronchi, or rubs. CARDIOVASCULAR: regular rate, normal rhythm.  Clear S1, S2, no murmurs, gallops, or rubs. ABDOMEN: soft, non-tender, non-distended.  Bowels sounds present in all quadrants and normoactive.  No palpable masses. EXT: warm and dry.  Peripheral pulses equal, intact, and +2 globally.  No clubbing or cyanosis. No edema in bilateral lower extremities. SKIN: warm and dry with normal turgor.  No rashes or abnormal lesions observed. NEURO: CN II-XII grossly intact.  Muscle strength +5/5 in bilateral upper and lower extremities.  Sensation is grossly intact.  No focal deficit.     Assessment & Plan:

## 2011-07-08 NOTE — Progress Notes (Signed)
[name], female   DOB: 03-06-1961, 51 y.o.   MRN: [id number]  Subjective:    HPI Please see the A&P for the status of the pt's chronic medical problems.   Review of Systems  Constitutional: Negative for fever, chills, diaphoresis, activity change, appetite change, fatigue and unexpected weight change.  HENT: Negative for hearing loss, congestion and neck stiffness.   Eyes: Negative for photophobia, pain and visual disturbance.  Respiratory: Negative for cough, chest tightness, shortness of breath and wheezing.   Cardiovascular: Negative for chest pain and palpitations.  Gastrointestinal: Negative for nausea, vomiting, abdominal pain, diarrhea, blood in stool and anal bleeding.  Genitourinary: Negative for dysuria, urgency, frequency, hematuria, flank pain, decreased urine volume, vaginal bleeding, vaginal discharge, difficulty urinating, genital sores, vaginal pain and pelvic pain.  Musculoskeletal: Negative for joint swelling.  Neurological: Negative for dizziness, syncope, speech difficulty, weakness, numbness and headaches.       Objective:   Physical Exam  Vital signs reviewed GEN: No apparent distress.  Alert and oriented x 3.  Pleasant, conversant, and cooperative to exam. HEENT: head is autraumatic and normocephalic.  Neck is supple without palpable masses or lymphadenopathy.  No JVD or carotid bruits.  Vision intact.  EOMI.  PERRLA.  Sclerae anicteric.  Conjunctivae without pallor or injection. Mucous membranes are moist.  Oropharynx is without erythema, exudates, or other abnormal lesions.  Dentition is fair.  No facial swelling or erythema. RESP:  Lungs are clear to ascultation bilaterally with good air movement.  No wheezes, ronchi, or rubs. CARDIOVASCULAR: regular rate, normal rhythm.  Clear S1, S2, no murmurs, gallops, or rubs. ABDOMEN: soft, non-tender, non-distended.  Bowels sounds present in all quadrants and normoactive.  No palpable masses. EXT: warm  and dry.  Peripheral pulses equal, intact, and +2 globally.  No clubbing or cyanosis. No edema in bilateral lower extremities. SKIN: warm and dry with normal turgor.  No rashes or abnormal lesions observed. NEURO: CN II-XII grossly intact.  Muscle strength +5/5 in bilateral upper and lower extremities.  Sensation is grossly intact.  No focal deficit.     Assessment & Plan:

## 2011-07-08 NOTE — Telephone Encounter (Signed)
Called patient to inform her of blood results; particular her potassium was within normal limits.  I spoke with her niece who informed me that the patient had not yet returned home.  I left a message for pt to return my call to discuss results of blood work.

## 2011-07-08 NOTE — Assessment & Plan Note (Signed)
Patient is tolerating pravastatin well. Will check lipid profile today.

## 2011-07-15 ENCOUNTER — Telehealth: Payer: Self-pay | Admitting: Internal Medicine

## 2011-07-15 ENCOUNTER — Other Ambulatory Visit (INDEPENDENT_AMBULATORY_CARE_PROVIDER_SITE_OTHER): Payer: Medicaid Other

## 2011-07-15 DIAGNOSIS — E876 Hypokalemia: Secondary | ICD-10-CM

## 2011-07-15 DIAGNOSIS — I10 Essential (primary) hypertension: Secondary | ICD-10-CM

## 2011-07-15 LAB — BASIC METABOLIC PANEL WITH GFR
BUN: 12 mg/dL (ref 6–23)
CO2: 28 mEq/L (ref 19–32)
Calcium: 9.1 mg/dL (ref 8.4–10.5)
Chloride: 101 mEq/L (ref 96–112)
Creat: 0.84 mg/dL (ref 0.50–1.10)

## 2011-07-15 NOTE — Telephone Encounter (Signed)
Called pt to discuss her low K of 2.8.  She states she still has plenty of KCl at home.  Advised pt now and again at 10pm.  Advised her to take KCl tomorrow(Sat) morning and then again in the evening.  Advised her to resume her daily supplementation of starting on Sunday.  Will have her return next week for repeat BMET and renin/aldo.  Pt states she understands and agrees with this plan.

## 2011-07-20 ENCOUNTER — Ambulatory Visit (INDEPENDENT_AMBULATORY_CARE_PROVIDER_SITE_OTHER): Payer: Medicaid Other | Admitting: Internal Medicine

## 2011-07-20 ENCOUNTER — Encounter: Payer: Self-pay | Admitting: Internal Medicine

## 2011-07-20 ENCOUNTER — Other Ambulatory Visit: Payer: Self-pay | Admitting: Internal Medicine

## 2011-07-20 VITALS — BP 121/85 | HR 83 | Temp 97.5°F | Ht 61.0 in | Wt 141.8 lb

## 2011-07-20 DIAGNOSIS — IMO0001 Reserved for inherently not codable concepts without codable children: Secondary | ICD-10-CM

## 2011-07-20 DIAGNOSIS — E785 Hyperlipidemia, unspecified: Secondary | ICD-10-CM

## 2011-07-20 DIAGNOSIS — M791 Myalgia, unspecified site: Secondary | ICD-10-CM | POA: Insufficient documentation

## 2011-07-20 DIAGNOSIS — R109 Unspecified abdominal pain: Secondary | ICD-10-CM | POA: Insufficient documentation

## 2011-07-20 DIAGNOSIS — E876 Hypokalemia: Secondary | ICD-10-CM

## 2011-07-20 DIAGNOSIS — R110 Nausea: Secondary | ICD-10-CM

## 2011-07-20 LAB — URINALYSIS, ROUTINE W REFLEX MICROSCOPIC
Nitrite: NEGATIVE
Specific Gravity, Urine: 1.021 (ref 1.005–1.030)
Urobilinogen, UA: 1 mg/dL (ref 0.0–1.0)

## 2011-07-20 LAB — URINALYSIS, MICROSCOPIC ONLY: Casts: NONE SEEN

## 2011-07-20 LAB — LIPID PANEL
Cholesterol: 231 mg/dL — ABNORMAL HIGH (ref 0–200)
HDL: 63 mg/dL (ref >39)
Total CHOL/HDL Ratio: 3.7 Ratio
Triglycerides: 99 mg/dL (ref <150)

## 2011-07-20 LAB — BASIC METABOLIC PANEL
BUN: 11 mg/dL (ref 6–23)
Potassium: 3.4 mEq/L — ABNORMAL LOW (ref 3.5–5.3)
Sodium: 138 mEq/L (ref 135–145)

## 2011-07-20 LAB — CK: Total CK: 110 U/L (ref 7–177)

## 2011-07-20 MED ORDER — SPIRONOLACTONE 25 MG PO TABS: 25.0000 mg | ORAL_TABLET | Freq: Every day | ORAL | Status: DC

## 2011-07-20 MED ORDER — ONDANSETRON HCL 4 MG PO TABS: 4.0000 mg | ORAL_TABLET | Freq: Three times a day (TID) | ORAL | Status: AC | PRN

## 2011-07-20 NOTE — Progress Notes (Signed)
[name], female   DOB: Oct 22, 1960, 51 y.o.   MRN: [id number]  Subjective:    HPI:  Pt is here today for f/u of her potassium.  She reports feeling well today. She reports fatigue that began 4 days prior to her office visit.  She reports increased sleep, generalized weakness, and nausea.  She notes the nasuea began 3 days PTA with decreased appetite.  She has had some dry heaves but no emesis.  She admits to abdominal cramping but no diarrhea..  She admits to intermittent chills but no fever.  She admits to muscle aches.  Patient states she is not feeling depressed.   Review of Systems  Constitutional: Negative for fever, , diaphoresis, activity change,  and unexpected weight change.  HENT: Negative for hearing loss, congestion and neck stiffness.   Eyes: Negative for photophobia, pain and visual disturbance.  Respiratory: Negative for cough, chest tightness, shortness of breath and wheezing.   Cardiovascular: Negative for chest pain and palpitations.  Gastrointestinal: Negative for nausea, vomiting, abdominal pain, diarrhea, blood in stool and anal bleeding.  Genitourinary: Negative for dysuria, urgency, frequency, hematuria, flank pain, decreased urine volume, vaginal bleeding, vaginal discharge, difficulty urinating, genital sores, vaginal pain and pelvic pain.  Musculoskeletal: Negative for joint swelling.  Neurological: Negative for dizziness, syncope, speech difficulty, weakness, numbness and headaches.       Objective:   Physical Exam  Vital signs reviewed GEN: No apparent distress.  Alert and oriented x 3.  Pleasant, conversant, and cooperative to exam. HEENT: head is autraumatic and normocephalic.  Neck is supple without palpable masses or lymphadenopathy.  No JVD or carotid bruits.  Vision intact.  EOMI.  PERRLA.  Sclerae anicteric.  Conjunctivae without pallor or injection. Mucous membranes are moist.  Oropharynx is without erythema, exudates, or other abnormal  lesions.  Dentition is good.  RESP:  Lungs are clear to ascultation bilaterally with good air movement.  No wheezes, ronchi, or rubs. CARDIOVASCULAR: regular rate, normal rhythm.  Clear S1, S2, no murmurs, gallops, or rubs. ABDOMEN: soft, non-tender, non-distended.  Bowels sounds present in all quadrants and normoactive.  No palpable masses. EXT: warm and dry.  Peripheral pulses equal, intact, and +2 globally.  No clubbing or cyanosis. No edema in bilateral lower extremities. SKIN: warm and dry with normal turgor.  No rashes or abnormal lesions observed. NEURO: CN II-XII grossly intact.  Muscle strength +5/5 in bilateral upper and lower extremities.  Sensation is grossly intact.  No focal deficit.     Assessment & Plan:

## 2011-07-20 NOTE — Assessment & Plan Note (Signed)
LDL not at goal.  Will repeat lipid profile today and CK level as outlined below.  If her CK level is within normal limits and LDL is not at goal we'll escalate her statin therapy; likely transition from pravastatin to simvastatin

## 2011-07-20 NOTE — Patient Instructions (Signed)
Schedule a follow up lab visit for next week Spironolactone is a new medicine for your blood pressure and low potassium Stop taking the norvasc and start spironolactone I will call you to discuss the results of your blood work Keep taking one potassium pill daily; I will call you if you need to take more Zofran is a medicine to help with nausea.  You can use this with phenergan

## 2011-07-20 NOTE — Assessment & Plan Note (Signed)
Her hypokalemia is of unclear etiology.  Will repeat renin/aldo, urine cr, urine K, and u/a today.  Prior renin/aldo results may be confounded by use of arb. She is not having any diarrhea or emesis that would result in GI loss.  She is now off of all diuretics.  RTA (including tenofovir related fanconi's) seems unlikely with normal bicarb; will repeat bmet today as well as urine analysis to look for glucosuria/other evidence of RTA.  Will also start spironolactone today.  Pt is advised to continue with KCL supplementation.  Will have her return in 1 week for repeat bmet.

## 2011-07-20 NOTE — Assessment & Plan Note (Signed)
May be related to acute viral illness also causing GI symptoms.   She is currently taking pravastatin for management of her hyperlipidemia; will check CK level today

## 2011-07-20 NOTE — Assessment & Plan Note (Signed)
Maybe related to acute viral illness in addition to gastric irritation from potassium. Will prescribe Zofran for additional symptomatic relief.

## 2011-07-21 ENCOUNTER — Telehealth: Payer: Self-pay | Admitting: Internal Medicine

## 2011-07-25 LAB — ALDOSTERONE + RENIN ACTIVITY W/ RATIO
ALDO / PRA Ratio: 6 Ratio (ref 0.9–28.9)
Aldosterone: 20 ng/dL

## 2011-07-27 ENCOUNTER — Encounter: Payer: Self-pay | Admitting: *Deleted

## 2011-07-27 ENCOUNTER — Other Ambulatory Visit (INDEPENDENT_AMBULATORY_CARE_PROVIDER_SITE_OTHER): Payer: Medicaid Other

## 2011-07-27 DIAGNOSIS — E876 Hypokalemia: Secondary | ICD-10-CM

## 2011-07-27 LAB — BASIC METABOLIC PANEL
BUN: 9 mg/dL (ref 6–23)
Calcium: 9.9 mg/dL (ref 8.4–10.5)
Creat: 0.5 mg/dL (ref 0.50–1.10)
Glucose, Bld: 87 mg/dL (ref 70–99)

## 2011-07-27 MED ORDER — POTASSIUM CHLORIDE CRYS ER 20 MEQ PO TBCR: 40.0000 meq | EXTENDED_RELEASE_TABLET | Freq: Two times a day (BID) | ORAL | Status: DC

## 2011-07-27 NOTE — Progress Notes (Signed)
[name] [name] stated that since taking the spironolactone she has felt terrible, noting dizziness.  Orthostatics were negative.  It appears that she simply did not tolerate the spironolactone despite trying to stick it out for a week.  Na 138, K 3.5, Cl 100, HCO3 28, Glucose 87, BUN 9, Creatinine 0.5, Ca 9.9.  We will therefore stop the spironolactone, increase her KCl to 40 mEq by mouth twice daily, and restart the amlodipine back at 10 mg daily (she had stopped this when starting the spironolactone).  She is scheduled to follow-up with Dr. Arvilla Market on May 1st at 2:15 PM to assess response to these changes and recheck her potassium.

## 2011-07-27 NOTE — Progress Notes (Signed)
Please have [name] [name] get her BMP as already ordered and wait for the results.  Please obtain a lying and standing blood pressure and pulse (no sitting vitals).  Further recommendations will depend upon the BMP results.

## 2011-07-27 NOTE — Progress Notes (Signed)
Pt presents for lab appt and c/o dizziness and "fluttering of heart" since starting the spiralactone after her 3/27 visit w/ dr mills. She states she takes the med first thing in the morning and takes all her meds only as directed. Please advise

## 2011-07-27 NOTE — Progress Notes (Signed)
Lying BP: 145/89 hr 81 Standing: 132/91 hr 85

## 2011-07-29 ENCOUNTER — Other Ambulatory Visit: Payer: Self-pay | Admitting: *Deleted

## 2011-07-29 DIAGNOSIS — Z113 Encounter for screening for infections with a predominantly sexual mode of transmission: Secondary | ICD-10-CM

## 2011-08-05 ENCOUNTER — Other Ambulatory Visit: Payer: Self-pay | Admitting: *Deleted

## 2011-08-05 DIAGNOSIS — I10 Essential (primary) hypertension: Secondary | ICD-10-CM

## 2011-08-05 MED ORDER — AMLODIPINE BESYLATE 10 MG PO TABS: 10.0000 mg | ORAL_TABLET | Freq: Every day | ORAL | Status: DC

## 2011-08-05 MED ORDER — PRAVASTATIN SODIUM 40 MG PO TABS: 40.0000 mg | ORAL_TABLET | Freq: Every day | ORAL | Status: DC

## 2011-08-05 MED ORDER — NAPROXEN 500 MG PO TABS: 500.0000 mg | ORAL_TABLET | Freq: Two times a day (BID) | ORAL | Status: DC | PRN

## 2011-08-17 ENCOUNTER — Encounter: Payer: Self-pay | Admitting: Internal Medicine

## 2011-08-17 ENCOUNTER — Ambulatory Visit (INDEPENDENT_AMBULATORY_CARE_PROVIDER_SITE_OTHER): Payer: Medicaid Other | Admitting: Internal Medicine

## 2011-08-17 VITALS — BP 117/70 | HR 105 | Temp 98.0°F

## 2011-08-17 DIAGNOSIS — R197 Diarrhea, unspecified: Secondary | ICD-10-CM

## 2011-08-17 DIAGNOSIS — B20 Human immunodeficiency virus [HIV] disease: Secondary | ICD-10-CM

## 2011-08-17 DIAGNOSIS — R252 Cramp and spasm: Secondary | ICD-10-CM

## 2011-08-17 LAB — COMPREHENSIVE METABOLIC PANEL
ALT: 47 U/L — ABNORMAL HIGH (ref 0–35)
AST: 38 U/L — ABNORMAL HIGH (ref 0–37)
Alkaline Phosphatase: 104 U/L (ref 39–117)
Sodium: 135 mEq/L (ref 135–145)
Total Bilirubin: 0.3 mg/dL (ref 0.3–1.2)
Total Protein: 7.5 g/dL (ref 6.0–8.3)

## 2011-08-17 LAB — CBC
MCH: 26.7 pg (ref 26.0–34.0)
MCHC: 33.4 g/dL (ref 30.0–36.0)
MCV: 79.9 fL (ref 78.0–100.0)
Platelets: 190 10*3/uL (ref 150–400)
RBC: 5.17 MIL/uL — ABNORMAL HIGH (ref 3.87–5.11)

## 2011-08-17 MED ORDER — POTASSIUM CHLORIDE CRYS ER 20 MEQ PO TBCR: EXTENDED_RELEASE_TABLET | ORAL | Status: DC

## 2011-08-17 MED ORDER — KETOROLAC TROMETHAMINE 10 MG PO TABS: 10.0000 mg | ORAL_TABLET | Freq: Four times a day (QID) | ORAL | Status: AC | PRN

## 2011-08-17 MED ORDER — KETOROLAC TROMETHAMINE 30 MG/ML IJ SOLN
30.0000 mg | Freq: Once | INTRAMUSCULAR | Status: AC
  Administered 2011-08-17: 30 mg via INTRAMUSCULAR

## 2011-08-17 MED ORDER — MAGNESIUM OXIDE 400 MG PO TABS: 400.0000 mg | ORAL_TABLET | Freq: Every day | ORAL | Status: DC

## 2011-08-17 NOTE — Patient Instructions (Signed)
Please, increase your potassium dose to 2 tablets by mouth three times daily. Please, start taking Magnesium tablets as instructed. Please, take Pain medication ONLY as instructed. Please, follow up with [place] in 2-3 days if no improvement.

## 2011-08-17 NOTE — Progress Notes (Signed)
[name], female   DOB: Sep 03, 1960, 51 y.o.   MRN: [id number] HPI:    1. Severe leg cramps for 2 days since developing severe diarrhea. Denies any fever, rash, chills, blood or mucus in the stools (7 stools overnight reported). Patient is crying during the interview. Hx of hypokalemia with a negative work up in the past (plese, refer to Dr. Arvilla Market' notes). Patient reports compliance with all her medications including HAART and KCL. Review of Systems: Negative except per history of present illness  Physical Exam:  Nursing notes and vitals reviewed General:  alert, well-developed, and cooperative to examination.   Lungs:  normal respiratory effort, no accessory muscle use, normal breath sounds, no crackles, and no wheezes. Heart:  normal rate, regular rhythm, no murmurs, no gallop, and no rub.   Abdomen:  soft, non-tender, normal bowel sounds, no distention, no guarding, no rebound tenderness, no hepatomegaly, and no splenomegaly.   Extremities:  No cyanosis, clubbing, edema Neurologic:  alert & oriented X3, nonfocal exam  Meds:  (Not in a hospital admission)  Allergies: Acetaminophen; Dyazide; Lisinopril; and Morphine Past Medical History  Diagnosis Date  . HIV infection     1994  . Hepatitis C   . Hypertension   . GERD (gastroesophageal reflux disease)   . Allergy   . Depression   . CHF (congestive heart failure)   . Diverticulitis   . Arthritis    No past surgical history on file. Family History  Problem Relation Age of Onset  . Heart disease Mother   . Diabetes Mother   . Stroke Mother   . Heart disease Father   . Stroke Father    History   Social History  . Marital Status: Single    Spouse Name: N/A    Number of Children: 0  . Years of Education: N/A   Occupational History  . Not on file.   Social History Main Topics  . Smoking status: Former Smoker    Types: Cigarettes    Quit date: 04/26/2007  . Smokeless tobacco: Not on file   Comment:  QUIT 2009  . Alcohol Use: No  . Drug Use: No  . Sexually Active: Yes -- Female partner(s)     pt. given condoms   Other Topics Concern  . Not on file   Social History Narrative   Alternate # [phone]    A/P: 1. Diarrhea ->infecrtious in a setting of HIV-related immunosuppression vs iatrogenic ( on Lexiva) -CD4 and VL per Dr. Ninetta Lights -Stool lactoferrin -C.diff PCR -CBC, C-met, Mg ++ -hydration  2. Severe bilateral leg cramps -Hx of long-standing hypokalemia -> Tenofovir - induced Fanconi? -Consider to D/CTenofovir  and switch to NNRTI's Gerhard Perches M, et al.  Marena Chancy dis 2008 Jan1;197(1):102-8) -C-met, Mg level -last TSH WNL -Toradol 30 mg IM x1 now -increase KCL to 40 mg PO tid -add Mg Ox 400 mg PO daily -f/u in 2-3 days  3. HIV -currently on HAART - last CD4 count is <50 and VL <20 as of 05/02/2011 - Opportunistic infections PPX With a VL<20 (?)->will defer to Dr. Ninetta Lights (ID) -patient is strongly advised to follow with Dr. Ninetta Lights as instructed on Aug 29, 2011.

## 2011-08-24 ENCOUNTER — Encounter: Payer: Medicaid Other | Admitting: Internal Medicine

## 2011-08-29 ENCOUNTER — Encounter: Payer: Medicaid Other | Admitting: Internal Medicine

## 2011-08-29 NOTE — Telephone Encounter (Signed)
Called patient to discuss results of recent basic metabolic panel revealing hypokalemia. Instructed patient to resume potassium supplementation and have asked her to return for a repeat bmet in one week.  She expresses understanding and agreement with this plan.

## 2011-08-30 ENCOUNTER — Encounter: Payer: Self-pay | Admitting: Internal Medicine

## 2011-08-30 ENCOUNTER — Ambulatory Visit (INDEPENDENT_AMBULATORY_CARE_PROVIDER_SITE_OTHER): Payer: Medicaid Other | Admitting: Internal Medicine

## 2011-08-30 VITALS — BP 143/94 | HR 91 | Temp 98.4°F | Wt 143.5 lb

## 2011-08-30 DIAGNOSIS — N2589 Other disorders resulting from impaired renal tubular function: Secondary | ICD-10-CM

## 2011-08-30 DIAGNOSIS — E876 Hypokalemia: Secondary | ICD-10-CM

## 2011-08-30 DIAGNOSIS — R252 Cramp and spasm: Secondary | ICD-10-CM

## 2011-08-30 DIAGNOSIS — I10 Essential (primary) hypertension: Secondary | ICD-10-CM

## 2011-08-30 LAB — BASIC METABOLIC PANEL
Calcium: 9.7 mg/dL (ref 8.4–10.5)
Creat: 1.01 mg/dL (ref 0.50–1.10)
Sodium: 139 mEq/L (ref 135–145)

## 2011-08-30 MED ORDER — AMLODIPINE BESYLATE 10 MG PO TABS: 10.0000 mg | ORAL_TABLET | Freq: Every day | ORAL | Status: DC

## 2011-08-30 MED ORDER — DIAZEPAM 5 MG PO TABS: 5.0000 mg | ORAL_TABLET | Freq: Three times a day (TID) | ORAL | Status: DC | PRN

## 2011-08-30 NOTE — Progress Notes (Signed)
[name], female   DOB: Apr 15, 1961, 51 y.o.   MRN: [id number]  Subjective:    HPI:  Pt is here today for routine follow up.  Please see the A&P for the status of the pt's chronic medical problems.  Review of Systems  Constitutional: Negative for fever, , diaphoresis, activity change,  and unexpected weight change.  HENT: Negative for hearing loss, congestion and neck stiffness.   Eyes: Negative for photophobia, pain and visual disturbance.  Respiratory: Negative for cough, chest tightness, shortness of breath and wheezing.   Cardiovascular: Negative for chest pain and palpitations.  Gastrointestinal: Negative for nausea, vomiting, abdominal pain, diarrhea, blood in stool and anal bleeding.  Genitourinary: Negative for dysuria, urgency, frequency, hematuria, flank pain, decreased urine volume, vaginal bleeding, vaginal discharge, difficulty urinating, genital sores, vaginal pain and pelvic pain.  Musculoskeletal: Negative for joint swelling.  Neurological: Negative for dizziness, syncope, speech difficulty, weakness, numbness and headaches.       Objective:   Physical Exam  Vital signs reviewed GEN: No apparent distress.  Alert and oriented x 3.  Pleasant, conversant, and cooperative to exam. HEENT: head is autraumatic and normocephalic.  Neck is supple without palpable masses or lymphadenopathy.  No JVD or carotid bruits.  Vision intact.  EOMI.  PERRLA.  Sclerae anicteric.  Conjunctivae without pallor or injection. Mucous membranes are moist.  Oropharynx is without erythema, exudates, or other abnormal lesions.  Dentition is good.  RESP:  Lungs are clear to ascultation bilaterally with good air movement.  No wheezes, ronchi, or rubs. CARDIOVASCULAR: regular rate, normal rhythm.  Clear S1, S2, no murmurs, gallops, or rubs. ABDOMEN: soft, non-tender, non-distended.  Bowels sounds present in all quadrants and normoactive.  No palpable masses. EXT: warm and dry.  Peripheral  pulses equal, intact, and +2 globally.  No clubbing or cyanosis. No edema in bilateral lower extremities. SKIN: warm and dry with normal turgor.  No rashes or abnormal lesions observed. NEURO: CN II-XII grossly intact.  Muscle strength +5/5 in bilateral upper and lower extremities.  Sensation is grossly intact.  No focal deficit.     Assessment & Plan:

## 2011-08-30 NOTE — Patient Instructions (Signed)
Schedule a follow up appointment with Dr. Arvilla Market in 2-3 weeks. Try taking an over the counter B vitamin complex with B6 every day to help with muscle cramps. Valium is a new medicine to help with muscle cramps and spasm I will call you if your blood work is abnormal Keep taking your medications as directed. Call the clinic at [phone] with any concerns or questions.

## 2011-08-31 ENCOUNTER — Other Ambulatory Visit: Payer: Self-pay | Admitting: Infectious Disease

## 2011-08-31 LAB — CYCLIC CITRUL PEPTIDE ANTIBODY, IGG: Cyclic Citrullin Peptide Ab: <2 U/mL (ref 0.0–5.0)

## 2011-09-01 ENCOUNTER — Other Ambulatory Visit: Payer: Self-pay | Admitting: *Deleted

## 2011-09-01 MED ORDER — TRAMADOL HCL 50 MG PO TABS: ORAL_TABLET | ORAL | Status: DC

## 2011-09-01 MED ORDER — FLUTICASONE PROPIONATE 50 MCG/ACT NA SUSP: 2.0000 | NASAL | Status: DC | PRN

## 2011-09-01 MED ORDER — DIAZEPAM 5 MG PO TABS: 5.0000 mg | ORAL_TABLET | Freq: Three times a day (TID) | ORAL | Status: DC | PRN

## 2011-09-01 NOTE — Assessment & Plan Note (Signed)
Patient continues to experience intermittent muscle cramping. Her CK level when checked was within normal limits.  She notes her symptoms are improved when her potassium is within normal limits. Literature review suggests use of B6 vitamin complex will help to reduce symptoms.  Patient is interested in this and plans to pick up supplements today.  Discussed use of muscle relaxants and benzodiazepines; will start trial of valium 5-10mg  PO QHS.  WIll have her f/u in 2 weeks and will assess her response to therapy.

## 2011-09-01 NOTE — Telephone Encounter (Signed)
Please confirm need for valium rx.  I thought I sent pt home with physical rx yesterday; if this rx was not filled/picked up, then please call in new rx.  If already filled/picked up, then do not call in new rx.

## 2011-09-01 NOTE — Assessment & Plan Note (Signed)
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  Her workup has been unrevealing and has included urinary electrolyte studies, renin:aldo, cortisol, etc.  Her urinary pH is consistently above 5.5 c/w impaired urinary acidification.  Patient's with incomplete distal RTA are able to maintain normal serum bicarbonate levels as net acid excretion is maintained at a rate equal to that of generation.  Per UpToDate, " incomplete distal RTA refers to a disorder in which impaired urinary acidification and an inability to reduce the urine pH to 5.3 or below with an acid load exists, but net acid excretion is maintained at a rate equal to acid generation. This is achieved by an increase in ammonium excretion that offsets the reduction in titratable acid excretion caused by the high urine pH. Thus, patients with this disorder are able to maintain a normal serum bicarbonate concentration." There does appear to be a family history and she reports her mother had problems with low potassium is also has chronic hypokalemia.  Will check CCP titer to assess for underlying rheumatoid as rheumatoid and other rheumatological conditions may first manifest as distal RTA.  Will pursue further rheumatological workup if she develops other symptoms to suggest rheumatologic disease.  It is also possible that Tenofovir may be continuing to your condition but would not alter her antiretroviral therapy as it is very easy to supplement potassium.  Continue with oral daily potassium supplementation.  Will obtain a basic metabolic panel today and adjust KCL dose if indicated.  Anticipate that her incomplete distal RTA may progress to complete distal RTA at which time she will likely need to begin therapy with oral bicarbonate. Should this occur, will refer her to renal for additional evaluation and management.  Discussed above at length with patient today.  Will attempt to locate liquid formulation of KCL to  reduce GI side effects.

## 2011-09-01 NOTE — Telephone Encounter (Signed)
Talked with [name] [name] at [facility] - Rx on generic Valium given - may hold  - pt may have written Rx.

## 2011-09-09 ENCOUNTER — Encounter: Payer: Medicaid Other | Admitting: Internal Medicine

## 2011-09-12 ENCOUNTER — Other Ambulatory Visit: Payer: Medicaid Other

## 2011-09-12 DIAGNOSIS — Z79899 Other long term (current) drug therapy: Secondary | ICD-10-CM

## 2011-09-12 DIAGNOSIS — B20 Human immunodeficiency virus [HIV] disease: Secondary | ICD-10-CM

## 2011-09-12 DIAGNOSIS — Z113 Encounter for screening for infections with a predominantly sexual mode of transmission: Secondary | ICD-10-CM

## 2011-09-13 ENCOUNTER — Other Ambulatory Visit: Payer: Self-pay | Admitting: Infectious Diseases

## 2011-09-13 DIAGNOSIS — Z1231 Encounter for screening mammogram for malignant neoplasm of breast: Secondary | ICD-10-CM

## 2011-09-13 LAB — COMPREHENSIVE METABOLIC PANEL
AST: 26 U/L (ref 0–37)
Albumin: 4.3 g/dL (ref 3.5–5.2)
Alkaline Phosphatase: 114 U/L (ref 39–117)
BUN: 14 mg/dL (ref 6–23)
Calcium: 9.3 mg/dL (ref 8.4–10.5)
Chloride: 104 mEq/L (ref 96–112)
Glucose, Bld: 83 mg/dL (ref 70–99)
Potassium: 4.2 mEq/L (ref 3.5–5.3)
Sodium: 138 mEq/L (ref 135–145)
Total Protein: 7.3 g/dL (ref 6.0–8.3)

## 2011-09-13 LAB — CBC
HCT: 39.4 % (ref 36.0–46.0)
Hemoglobin: 12.8 g/dL (ref 12.0–15.0)
MCHC: 32.5 g/dL (ref 30.0–36.0)
WBC: 8.4 10*3/uL (ref 4.0–10.5)

## 2011-09-13 LAB — RPR

## 2011-09-13 LAB — HIV-1 RNA QUANT-NO REFLEX-BLD
HIV 1 RNA Quant: 96 copies/mL — ABNORMAL HIGH (ref <20)
HIV-1 RNA Quant, Log: 1.98 {Log} — ABNORMAL HIGH (ref <1.30)

## 2011-09-13 LAB — LIPID PANEL: LDL Cholesterol: 137 mg/dL — ABNORMAL HIGH (ref 0–99)

## 2011-09-14 LAB — T-HELPER CELL (CD4) - (RCID CLINIC ONLY)
CD4 % Helper T Cell: 25 % — ABNORMAL LOW (ref 33–55)
CD4 T Cell Abs: 630 uL (ref 400–2700)

## 2011-09-21 ENCOUNTER — Encounter: Payer: Self-pay | Admitting: Internal Medicine

## 2011-09-21 ENCOUNTER — Ambulatory Visit (INDEPENDENT_AMBULATORY_CARE_PROVIDER_SITE_OTHER): Payer: Medicaid Other | Admitting: Internal Medicine

## 2011-09-21 VITALS — BP 145/91 | HR 92 | Temp 97.2°F | Ht 61.0 in | Wt 148.7 lb

## 2011-09-21 DIAGNOSIS — G4700 Insomnia, unspecified: Secondary | ICD-10-CM

## 2011-09-21 DIAGNOSIS — N2589 Other disorders resulting from impaired renal tubular function: Secondary | ICD-10-CM

## 2011-09-21 DIAGNOSIS — R252 Cramp and spasm: Secondary | ICD-10-CM

## 2011-09-21 DIAGNOSIS — I10 Essential (primary) hypertension: Secondary | ICD-10-CM

## 2011-09-21 MED ORDER — DIAZEPAM 5 MG PO TABS: 5.0000 mg | ORAL_TABLET | Freq: Every evening | ORAL | Status: DC | PRN

## 2011-09-21 NOTE — Assessment & Plan Note (Signed)
Blood pressure is slightly above goal today.  Will not make any changes to her regimen at this time and continue to monitor for now.  Patient is planning to make some changes to her diet and lose weight; this may help to lower her blood pressure.

## 2011-09-21 NOTE — Assessment & Plan Note (Signed)
Symptoms significantly improved with 5mg  valium before bed.  Will continue this for relief of insomnia as well as for nocturnal muscle cramps.  As outlined above, patient educated about dangers of abrupt cessation of benzodiazepines including: withdrawal, acute worsening of insomnia/anxiety, seizure, etc.  Patient expresses understanding of this as well as need to taper medication under the guidance of a physician should she wish to discontinue use of Valium.

## 2011-09-21 NOTE — Assessment & Plan Note (Signed)
Patient notes this is completely resolved with 5 mg of Valium at night. She notes that time is also significantly improved her insomnia. Will continue with 5 mg Valium before bedtime. Patient is advised not to discontinue this medication abruptly and that she will need to taper should she decide she no longer wishes to take valium.  Will prescribe a refill today and adjust prescription to reflect her use of one 5mg  tab before bed.

## 2011-09-21 NOTE — Patient Instructions (Addendum)
Please schedule a follow up appointment in 3 months. Keep taking your medicine as directed. Call the clinic at [phone] with any concerns/questions. I will call you if any of your blood work is abnormal. Keep up the good work!!! Hope you have a great birthday!!!!!

## 2011-09-21 NOTE — Progress Notes (Signed)
[name], female   DOB: 10/26/1960, 50 y.o.   MRN: [id number]  Subjective:    HPI:  Pt is here today for routine follow up.  Please see the A&P for the status of the pt's chronic medical problems.  Review of Systems  Constitutional: Negative for fever, , diaphoresis, activity change,  and unexpected weight change.  HENT: Negative for hearing loss, congestion and neck stiffness.   Eyes: Negative for photophobia, pain and visual disturbance.  Respiratory: Negative for cough, chest tightness, shortness of breath and wheezing.   Cardiovascular: Negative for chest pain and palpitations.  Gastrointestinal: Negative for nausea, vomiting, abdominal pain, diarrhea, blood in stool and anal bleeding.  Genitourinary: Negative for dysuria, urgency, frequency, hematuria, flank pain, decreased urine volume, vaginal bleeding, vaginal discharge, difficulty urinating, genital sores, vaginal pain and pelvic pain.  Musculoskeletal: Negative for joint swelling.  Neurological: Negative for dizziness, syncope, speech difficulty, weakness, numbness and headaches.       Objective:   Physical Exam  Vital signs reviewed GEN: No apparent distress.  Alert and oriented x 3.  Pleasant, conversant, and cooperative to exam. HEENT: head is autraumatic and normocephalic.  Neck is supple without palpable masses or lymphadenopathy.  No JVD or carotid bruits.  Vision intact.  EOMI.  PERRLA.  Sclerae anicteric.  Conjunctivae without pallor or injection. Mucous membranes are moist.  Oropharynx is without erythema, exudates, or other abnormal lesions.  Dentition is good.  RESP:  Lungs are clear to ascultation bilaterally with good air movement.  No wheezes, ronchi, or rubs. CARDIOVASCULAR: regular rate, normal rhythm.  Clear S1, S2, no murmurs, gallops, or rubs. ABDOMEN: soft, non-tender, non-distended.  Bowels sounds present in all quadrants and normoactive.  No palpable masses. EXT: warm and dry.  Peripheral  pulses equal, intact, and +2 globally.  No clubbing or cyanosis. No edema in bilateral lower extremities. SKIN: warm and dry with normal turgor.  No rashes or abnormal lesions observed. NEURO: CN II-XII grossly intact.  Muscle strength +5/5 in bilateral upper and lower extremities.  Sensation is grossly intact.  No focal deficit.     Assessment & Plan:    

## 2011-09-21 NOTE — Assessment & Plan Note (Signed)
Patient is doing well with KCL supplementation.  The liquid formulation is not causing the adverse GI effects she experienced with the KCl tablets and power.  Her K has been at goal (~4.)_ with KCl supplementation daily; will continue with this regimen.  Will check BMET today.

## 2011-09-22 LAB — BASIC METABOLIC PANEL
BUN: 14 mg/dL (ref 6–23)
Creat: 0.69 mg/dL (ref 0.50–1.10)
Potassium: 3.9 mEq/L (ref 3.5–5.3)

## 2011-09-26 ENCOUNTER — Ambulatory Visit: Payer: Medicaid Other | Admitting: Infectious Diseases

## 2011-09-28 ENCOUNTER — Ambulatory Visit
Admission: RE | Admit: 2011-09-28 | Discharge: 2011-09-28 | Disposition: A | Discharge status: Home / Self Care | Payer: Medicaid Other | Source: Ambulatory Visit | Attending: Infectious Diseases | Admitting: Infectious Diseases

## 2011-09-28 DIAGNOSIS — Z1231 Encounter for screening mammogram for malignant neoplasm of breast: Secondary | ICD-10-CM

## 2011-09-28 NOTE — Progress Notes (Signed)
Addended by: Remus Blake on: 09/28/2011 10:55 AM   Modules accepted: Orders

## 2011-09-30 ENCOUNTER — Ambulatory Visit: Payer: Medicaid Other | Admitting: Infectious Diseases

## 2011-09-30 ENCOUNTER — Other Ambulatory Visit: Payer: Self-pay | Admitting: Infectious Disease

## 2011-10-24 ENCOUNTER — Other Ambulatory Visit: Payer: Self-pay | Admitting: *Deleted

## 2011-10-24 MED ORDER — PRAVASTATIN SODIUM 40 MG PO TABS: 40.0000 mg | ORAL_TABLET | Freq: Every day | ORAL | Status: DC

## 2011-10-24 MED ORDER — NAPROXEN 500 MG PO TABS: 500.0000 mg | ORAL_TABLET | Freq: Two times a day (BID) | ORAL | Status: DC | PRN

## 2011-10-24 NOTE — Telephone Encounter (Signed)
Pt needs appt next few months.

## 2011-10-26 NOTE — Addendum Note (Signed)
Addended by: Remus Blake on: 10/26/2011 03:21 PM   Modules accepted: Orders

## 2011-11-03 ENCOUNTER — Other Ambulatory Visit: Payer: Self-pay | Admitting: *Deleted

## 2011-11-03 DIAGNOSIS — R252 Cramp and spasm: Secondary | ICD-10-CM

## 2011-11-03 MED ORDER — POTASSIUM CHLORIDE 40 MEQ/15ML (20%) PO LIQD: 40.0000 meq | Freq: Every day | ORAL | Status: DC

## 2011-11-03 NOTE — Telephone Encounter (Signed)
Ordered the liquid and D/C the pills off the med list.

## 2011-11-03 NOTE — Telephone Encounter (Signed)
Refill request is for Kcl 20% (53meq/15ml) take 15 ml daily.  This was written in last note from Dr Arvilla Market 5/29.  Was not changed in EMR

## 2011-11-04 ENCOUNTER — Ambulatory Visit: Payer: Medicaid Other | Admitting: Infectious Diseases

## 2011-11-18 ENCOUNTER — Encounter: Payer: Self-pay | Admitting: Infectious Diseases

## 2011-11-18 ENCOUNTER — Ambulatory Visit (INDEPENDENT_AMBULATORY_CARE_PROVIDER_SITE_OTHER): Payer: Medicaid Other | Admitting: Infectious Diseases

## 2011-11-18 VITALS — BP 149/99 | HR 83 | Temp 97.7°F | Wt 148.0 lb

## 2011-11-18 DIAGNOSIS — B20 Human immunodeficiency virus [HIV] disease: Secondary | ICD-10-CM

## 2011-11-18 DIAGNOSIS — N2589 Other disorders resulting from impaired renal tubular function: Secondary | ICD-10-CM

## 2011-11-18 LAB — COMPREHENSIVE METABOLIC PANEL
AST: 29 U/L (ref 0–37)
Albumin: 4.5 g/dL (ref 3.5–5.2)
Alkaline Phosphatase: 125 U/L — ABNORMAL HIGH (ref 39–117)
BUN: 14 mg/dL (ref 6–23)
Creat: 0.77 mg/dL (ref 0.50–1.10)
Potassium: 3.5 mEq/L (ref 3.5–5.3)
Total Bilirubin: 0.3 mg/dL (ref 0.3–1.2)

## 2011-11-18 LAB — CBC WITH DIFFERENTIAL/PLATELET
Basophils Relative: 0 % (ref 0–1)
Eosinophils Absolute: 0.1 10*3/uL (ref 0.0–0.7)
Eosinophils Relative: 1 % (ref 0–5)
HCT: 41.2 % (ref 36.0–46.0)
Hemoglobin: 13.8 g/dL (ref 12.0–15.0)
Lymphs Abs: 2.8 10*3/uL (ref 0.7–4.0)
MCH: 27.3 pg (ref 26.0–34.0)
MCHC: 33.5 g/dL (ref 30.0–36.0)
MCV: 81.4 fL (ref 78.0–100.0)
Monocytes Absolute: 0.6 10*3/uL (ref 0.1–1.0)
Monocytes Relative: 7 % (ref 3–12)

## 2011-11-18 LAB — T-HELPER CELL (CD4) - (RCID CLINIC ONLY): CD4 % Helper T Cell: 23 % — ABNORMAL LOW (ref 33–55)

## 2011-11-18 NOTE — Progress Notes (Signed)
  Subjective:    [name], female    DOB: [birth year], 51 y.o.   MRN: [id number]  HPI 51 yo F with hx of Hep C and HIV+ (LXVr/TRV). She had nasal septoplasty 01-05-10. Has bulging disc but is getting conservative rx for this.  Has been having difficulty with hypoKalemia. Has been seen by Dr Berlinda Last and Dr Arvilla Market. Still having  Muscle cramps, taking valium, K+ supplements. "My blood pressure may be a litlte high, i have not taken my medication yet this AM".  No prob with ART. Does not want to change medications (due to low K, prob related to TFV).  C/o problems with torticolis.   HIV 1 RNA Quant (copies/mL)  Date Value  09/12/2011 96*  05/02/2011 <20   05/25/2010 <20 copies/mL      CD4 T Cell Abs (cmm)  Date Value  09/12/2011 630   05/02/2011 810   05/25/2010 770     .  Review of Systems     Objective:   Physical Exam  Constitutional: She appears well-developed and well-nourished.  HENT:  Mouth/Throat: No oropharyngeal exudate.  Eyes: EOM are normal. Pupils are equal, round, and reactive to light.  Neck: Neck supple.  Cardiovascular: Normal rate, regular rhythm and normal heart sounds.   Pulmonary/Chest: Effort normal and breath sounds normal.  Abdominal: Soft. Bowel sounds are normal. She exhibits no distension. There is no tenderness.  Musculoskeletal: She exhibits no edema.  Lymphadenopathy:    She has no cervical adenopathy.          Assessment & Plan:

## 2011-11-18 NOTE — Assessment & Plan Note (Signed)
Will recheck her K+ today. She will make new appt at IM clinic, she misses Dr Arvilla Market

## 2011-11-18 NOTE — Assessment & Plan Note (Signed)
She is doing very, small blip. Will recheck her labs today. Given condoms. Her vax are up to date. Will see her back in 6 months.

## 2011-11-28 ENCOUNTER — Other Ambulatory Visit: Payer: Self-pay | Admitting: *Deleted

## 2011-11-28 DIAGNOSIS — B20 Human immunodeficiency virus [HIV] disease: Secondary | ICD-10-CM

## 2011-11-28 MED ORDER — RITONAVIR 100 MG PO TABS: 200.0000 mg | ORAL_TABLET | Freq: Every day | ORAL | Status: DC

## 2011-11-28 MED ORDER — RITONAVIR 100 MG PO TABS: 100.0000 mg | ORAL_TABLET | Freq: Every day | ORAL | Status: DC

## 2011-11-28 MED ORDER — EMTRICITABINE-TENOFOVIR DF 200-300 MG PO TABS: 1.0000 | ORAL_TABLET | Freq: Every day | ORAL | Status: DC

## 2011-12-15 ENCOUNTER — Encounter: Payer: Self-pay | Admitting: Internal Medicine

## 2011-12-15 ENCOUNTER — Ambulatory Visit (INDEPENDENT_AMBULATORY_CARE_PROVIDER_SITE_OTHER): Payer: Medicaid Other | Admitting: Internal Medicine

## 2011-12-15 VITALS — BP 130/84 | HR 92 | Temp 98.5°F | Ht 61.0 in | Wt 149.1 lb

## 2011-12-15 DIAGNOSIS — B1920 Unspecified viral hepatitis C without hepatic coma: Secondary | ICD-10-CM

## 2011-12-15 DIAGNOSIS — B1710 Acute hepatitis C without hepatic coma: Secondary | ICD-10-CM

## 2011-12-15 DIAGNOSIS — K219 Gastro-esophageal reflux disease without esophagitis: Secondary | ICD-10-CM

## 2011-12-15 DIAGNOSIS — I10 Essential (primary) hypertension: Secondary | ICD-10-CM

## 2011-12-15 DIAGNOSIS — F329 Major depressive disorder, single episode, unspecified: Secondary | ICD-10-CM

## 2011-12-15 DIAGNOSIS — B0229 Other postherpetic nervous system involvement: Secondary | ICD-10-CM | POA: Insufficient documentation

## 2011-12-15 DIAGNOSIS — Z889 Allergy status to unspecified drugs, medicaments and biological substances status: Secondary | ICD-10-CM

## 2011-12-15 DIAGNOSIS — M1990 Unspecified osteoarthritis, unspecified site: Secondary | ICD-10-CM

## 2011-12-15 DIAGNOSIS — N2589 Other disorders resulting from impaired renal tubular function: Secondary | ICD-10-CM

## 2011-12-15 DIAGNOSIS — G4700 Insomnia, unspecified: Secondary | ICD-10-CM

## 2011-12-15 DIAGNOSIS — E785 Hyperlipidemia, unspecified: Secondary | ICD-10-CM

## 2011-12-15 DIAGNOSIS — J309 Allergic rhinitis, unspecified: Secondary | ICD-10-CM

## 2011-12-15 DIAGNOSIS — Z8619 Personal history of other infectious and parasitic diseases: Secondary | ICD-10-CM

## 2011-12-15 DIAGNOSIS — B20 Human immunodeficiency virus [HIV] disease: Secondary | ICD-10-CM

## 2011-12-15 DIAGNOSIS — R252 Cramp and spasm: Secondary | ICD-10-CM

## 2011-12-15 DIAGNOSIS — G8929 Other chronic pain: Secondary | ICD-10-CM

## 2011-12-15 DIAGNOSIS — M549 Dorsalgia, unspecified: Secondary | ICD-10-CM

## 2011-12-15 DIAGNOSIS — F3289 Other specified depressive episodes: Secondary | ICD-10-CM

## 2011-12-15 DIAGNOSIS — E876 Hypokalemia: Secondary | ICD-10-CM

## 2011-12-15 DIAGNOSIS — Z0000 Encounter for general adult medical examination without abnormal findings: Secondary | ICD-10-CM

## 2011-12-15 LAB — COMPLETE METABOLIC PANEL WITH GFR
ALT: 27 U/L (ref 0–35)
Albumin: 4.3 g/dL (ref 3.5–5.2)
CO2: 26 mEq/L (ref 19–32)
GFR, Est African American: 83 mL/min
GFR, Est Non African American: 72 mL/min
Glucose, Bld: 113 mg/dL — ABNORMAL HIGH (ref 70–99)
Potassium: 3.6 mEq/L (ref 3.5–5.3)
Sodium: 138 mEq/L (ref 135–145)
Total Protein: 7.7 g/dL (ref 6.0–8.3)

## 2011-12-15 MED ORDER — AMITRIPTYLINE HCL 25 MG PO TABS: 25.0000 mg | ORAL_TABLET | Freq: Every day | ORAL | Status: DC

## 2011-12-15 NOTE — Assessment & Plan Note (Signed)
Amitriptyline 25 qhs added for postherpatic neuralgia may help insomnia too

## 2011-12-15 NOTE — Assessment & Plan Note (Signed)
Lipid Panel     Component Value Date/Time   CHOL 237* 09/12/2011 1409   TRIG 156* 09/12/2011 1409   HDL 69 09/12/2011 1409   CHOLHDL 3.4 09/12/2011 1409   VLDL 31 09/12/2011 1409   LDLCALC 137* 09/12/2011 1409   HIV meds may increase lipid panel  Continue to monitor Continue pravastatin

## 2011-12-15 NOTE — Patient Instructions (Addendum)
Postherpetic Neuralgia Shingles is a painful disease. It is caused by the herpes zoster virus. This is the same virus which also causes chickenpox. It can affect the torso, limbs, or the face. For most people, shingles is a condition of rather sudden onset. Pain usually lasts about 1 month. In older patients, or patients with poor immune systems, a painful, long-standing (chronic) condition called postherpetic neuralgia can develop. This condition rarely happens before age 63. But at least 50% of people over 50 become affected following an attack of shingles. There is a natural tendency for this condition to improve over time with no treatment. Less than 5% of patients have pain that lasts for more than 1 year. DIAGNOSIS  Herpes is usually easily diagnosed on physical exam. Pain sometimes follows when the skin sores (lesions) have disappeared. It is called postherpetic neuralgia. That name simply means the pain that follows herpes. TREATMENT   Treating this condition may be difficult. Usually one of the tricyclic antidepressants, often amitriptyline, is the first line of treatment. There is evidence that the sooner these medications are given, the more likely they are to reduce pain.   Conventional analgesics, regional nerve blocks, and anticonvulsants have little benefit in most cases when used alone. Other tricyclic anti-depressants are used as a second option if the first antidepressant is unsuccessful.   Anticonvulsants, including carbamazepine, have been found to provide some added benefit when used with a tricyclic anti-depressant. This is especially for the stabbing type of pain similar to that of trigeminal neuralgia.   Chronic opioid therapy. This is a strong narcotic pain medication. It is used to treat pain that is resistant to other measures. The issues of dependency and tolerance can be reduced with closely managed care.   Some cream treatments are applied locally to the affected area.  They can help when used with other treatments. Their use may be difficult in the case of postherpetic trigeminal neuralgia. This is involved with the face. So the substances can irritate the eye and the skin around the eye. Examples of creams used include Capsaicin and lidocaine creams.   For shingles, antiviral therapies along with analgesics are recommended. Studies of the effect of anti-viral agents such as acyclovir on shingles have been done. They show improved rates of healing and decreased severity of sudden (acute) pain. Some observations suggest that nerve blocks during shingles infection will:   Reduce pain.   Shorten the acute episode.   Prevent the emergence of postherpetic neuralgia.  Viral medications used include Acyclovir (Zovirax), Valacyclovir, Famciclovir and a lysine diet. Document Released: 07/02/2002 Document Revised: 03/31/2011 Document Reviewed: 04/11/2005 [facility] Patient Information 2012 [place], [place].  Hepatitis C-We are checking your status again Hepatitis C is a liver infection. It is caused by a germ called hepatitis C virus (HCV). This germ enters the body through blood and other bodily fluids. HOME CARE  Do not drink alcohol.   Only take medicines as told by your doctor.   Keep all blood test visits as told by your doctor.   Do not have sex until your doctor says it is okay.   Avoid activities that could put other people in contact with your blood. Do not share a toothbrush, nail clippers, razors, or needles.   Follow your doctor's advice to avoid spreading the infection to others.  GET HELP RIGHT AWAY IF:  You feel very tired or weak.   You have a temperature by mouth above 102 F (38.9 C), not controlled by medicine.  You do not feel like eating (loss of appetite).   You feel sick to your stomach (nauseous) or throw up (vomit).   Your skin or the whites of your eyes turn yellow (jaundice).   You bruise or bleed easily.   You have any  severe problems after treatment.  MAKE SURE YOU:  Understand these instructions.   Will watch your condition.   Will get help right away if you are not doing well or get worse.  Document Released: 03/24/2008 Document Revised: 03/31/2011 Document Reviewed: 08/12/2010 [facility] Patient Information 2012 [place], [place].  Amitriptyline tablets What is this medicine? AMITRIPTYLINE (a mee TRIP ti leen) is used to treat depression. This medicine may be used for other purposes; ask your health care provider or pharmacist if you have questions. What should I tell my health care provider before I take this medicine? They need to know if you have any of these conditions: -an alcohol problem -asthma, difficulty breathing -bipolar disorder or schizophrenia -difficulty passing urine, prostate trouble -glaucoma -heart disease or previous heart attack -liver disease -over active thyroid -seizures -thoughts or plans of suicide, a previous suicide attempt, or family history of suicide attempt -an unusual or allergic reaction to amitriptyline, other medicines, foods, dyes, or preservatives -pregnant or trying to get pregnant -breast-feeding How should I use this medicine? Take this medicine by mouth with a drink of water. Follow the directions on the prescription label. You can take the tablets with or without food. Take your medicine at regular intervals. Do not take it more often than directed. If you have been taking this medicine regularly for some time, do not suddenly stop taking it. You must gradually reduce the dose or you may get severe side effects. Ask your doctor or health care professional for advice. Even after you stop taking this medicine it can still affect your body for several days. A special MedGuide will be given to you by the pharmacist with each prescription and refill. Be sure to read this information carefully each time. Talk to your pediatrician regarding the use of this  medicine in children. Special care may be needed. Overdosage: If you think you have taken too much of this medicine contact a poison control center or emergency room at once. NOTE: This medicine is only for you. Do not share this medicine with others. What if I miss a dose? If you miss a dose, take it as soon as you can. If it is almost time for your next dose, take only that dose. Do not take double or extra doses. What may interact with this medicine? Do not take this medicine with any of the following medications: -arsenic trioxide -certain medicines used to regulate abnormal heartbeat or to treat other heart conditions -cisapride -droperidol -halofantrine -linezolid -MAOIs like Carbex, Eldepryl, Marplan, Nardil, and Parnate -methylene blue -other medicines for mental depression -phenothiazines like perphenazine, thioridazine and chlorpromazine -pimozide -probucol -procarbazine -sparfloxacin -St. John's Wort -ziprasidone This medicine may also interact with the following medications: -atropine and related drugs like hyoscyamine, scopolamine, tolterodine and others -barbiturate medicines for inducing sleep or treating seizures, like phenobarbital -cimetidine -disulfiram -ethchlorvynol -thyroid hormones such as levothyroxine This list may not describe all possible interactions. Give your health care provider a list of all the medicines, herbs, non-prescription drugs, or dietary supplements you use. Also tell them if you smoke, drink alcohol, or use illegal drugs. Some items may interact with your medicine. What should I watch for while using this medicine? Visit your doctor  or health care professional for regular checks on your progress. It can take several days before you feel the full effect of this medicine. Patients and their families should watch out for worsening depression or thoughts of suicide. Also watch out for sudden or severe changes in feelings such as feeling anxious,  agitated, panicky, irritable, hostile, aggressive, impulsive, severely restless, overly excited and hyperactive, or not being able to sleep. If this happens, especially at the beginning of antidepressant treatment or after a change in dose, call your health care professional. Bonita Quin may get drowsy or dizzy. Do not drive, use machinery, or do anything that needs mental alertness until you know how this medicine affects you. Do not stand or sit up quickly, especially if you are an older patient. This reduces the risk of dizzy or fainting spells. Alcohol may increase dizziness and drowsiness. Avoid alcoholic drinks. Do not treat yourself for coughs, colds, or allergies without asking your doctor or health care professional for advice. Some ingredients can increase possible side effects. Your mouth may get dry. Chewing sugarless gum or sucking hard candy, and drinking plenty of water will help. This medicine may cause dry eyes and blurred vision. If you wear contact lenses you may feel some discomfort. Lubricating drops may help. See your eye doctor if the problem does not go away or is severe. This medicine can make you more sensitive to the sun. Keep out of the sun. If you cannot avoid being in the sun, wear protective clothing and use sunscreen. Do not use sun lamps or tanning beds/booths. If you are diabetic, check your blood sugar more often than usual, especially during the first few weeks of treatment with this medicine. This medicine can affect blood sugar levels. Call your doctor or health care professional for advice if you notice a change in the results of blood or urine glucose tests. What side effects may I notice from receiving this medicine? Side effects that you should report to your doctor or health care professional as soon as possible: -allergic reactions like skin rash, itching or hives, swelling of the face, lips, or tongue -abnormal production of milk in females -breast enlargement in both  males and females -breathing problems -confusion, hallucinations -fast, irregular heartbeat -fever with increased sweating -muscle stiffness, or spasms -pain or difficulty passing urine, loss of bladder control -seizures -suicidal thoughts or other mood changes -swelling of the testicles -tingling, pain, or numbness in the feet or hands -yellowing of the eyes or skin Side effects that usually do not require medical attention (report to your doctor or health care professional if they continue or are bothersome): -change in sex drive or performance -constipation or diarrhea -nausea, vomiting -weight gain or loss This list may not describe all possible side effects. Call your doctor for medical advice about side effects. You may report side effects to FDA at 1-800-FDA-1088. Where should I keep my medicine? Keep out of the reach of children. Store at room temperature between 20 and 25 degrees C (68 and 77 degrees F). Throw away any unused medicine after the expiration date. NOTE: This sheet is a summary. It may not cover all possible information. If you have questions about this medicine, talk to your doctor, pharmacist, or health care provider.  2012, Elsevier/Gold Standard. (11/19/2009 3:38:28 PM)

## 2011-12-15 NOTE — Assessment & Plan Note (Signed)
Cont protonix

## 2011-12-15 NOTE — Progress Notes (Signed)
Subjective:    [name], female    DOB: 09-05-1960, 51 y.o.   MRN: [id number]  HPI Comments: 51 y.o PMH allergies, arthritis, ? CHF, HCV, HIV, HTN, GERD, depression, divericulitis, HLD, chronic back pain, RTA type 1, muscle cramps.  Pt presents for follow up and wants her K checked.  She reports having shingles on left back to left inframammary region.  She did not pick up the Rx for Valtrex because she did not know she had a Rx available.  She reports she is allergic to grass, dust, animal hair/fur.  She has had a septoplasty for allergies which has helped and she takes Albuterol, Zyrtec, Flonase and Singular as needed.  She has arthritis in b/l hands and chronic back pain (2/2 bulging disc).  She see's Dr. August Saucer who gives her back shots and she takes Naproxen and Ultram prn.  She has a h/o (per chart of CHF). Last stress echo 2/20113 showed LV systolic function wnl. Post stress test was hyperdynamic nl EF at rest and stress was wnl.  She has HCV with quant >1 millin in 05/2006.  She has had elevated AP 125 and ALT 36 in 10/2011.  She has a history of HIV follows with Dr. Ninetta Lights. Last cd4 ct 630 with quant <20K in 10/2011.  She was diagnosed in 86 with possible early contact in 22s with boyfriend.  She report h/o GERD and takes Protonix.  When asked about h/o depression she denies active depression.  She has a h/o diverticulitis and had a colectomy with coloscopy bag (reversed now) in 2003.  She has a history of HLD 08/2011 lipid panel TC 237, TG 156, HDL 69, LDL 137.  She saids ID provider told her HIV meds can increase lipids.  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  She reports muscle cramps 2/2 low K and takes Valium 5 mg qhs prn which helps.    Overall she feels good.   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 Negative for fever.       Wt gain 8-9 lbs 2/2 eating sweet foods   HENT: Positive for postnasal drip.        Allergies   Cardiovascular: Negative for chest pain.  Gastrointestinal:       Denies ab pain  Genitourinary:       Denies vag odor or discharge   Musculoskeletal: Positive for back pain.       +muscles cramps at night       Objective:   Physical Exam  Nursing note and vitals reviewed. Constitutional: She is oriented to person, place, and time. Vital signs are normal. She appears well-developed and well-nourished. She is cooperative. No distress.       Very pleasant  HENT:  Head: Normocephalic and atraumatic.  Mouth/Throat: Oropharynx is clear and moist.  Eyes: Conjunctivae are normal. No scleral icterus.  Cardiovascular: Normal rate, regular rhythm, S1 normal, S2 normal and normal heart sounds.   No murmur heard. Pulmonary/Chest: Effort normal and breath sounds normal. She has no wheezes.      Abdominal: Soft. Normal appearance and bowel sounds are normal. There is no rebound.       Obese abdomen  Neurological:  She is alert and oriented to person, place, and time.  Skin: Skin is warm and dry. No rash noted. She is not diaphoretic.  Psychiatric: She has a normal mood and affect. Her behavior is normal.          Assessment & Plan:  F/u 6-8 weeks

## 2011-12-15 NOTE — Assessment & Plan Note (Signed)
Not active problem.  

## 2011-12-15 NOTE — Assessment & Plan Note (Addendum)
Pt was not aware of previous results Will repeat hep panel today -if not hep sAb + will need vx Refer to GI for further w/u and tx

## 2011-12-15 NOTE — Assessment & Plan Note (Signed)
BP controlled with Norvasc 10 mg qd 130/84 today

## 2011-12-15 NOTE — Assessment & Plan Note (Signed)
Cont zyrtec, flonase, singular

## 2011-12-15 NOTE — Assessment & Plan Note (Signed)
Ultram, naproxen prn

## 2011-12-15 NOTE — Assessment & Plan Note (Signed)
S/p shingles Amitryptiline 25 mg qhs  F/u 6-8 weeks

## 2011-12-15 NOTE — Assessment & Plan Note (Signed)
HIV meds can cause  Check cmp today  Continue K qd

## 2011-12-15 NOTE — Assessment & Plan Note (Signed)
cd4 ct 630 quant <20 noted 10/2011 Follows with Dr. Ninetta Lights Cont HAART

## 2011-12-15 NOTE — Assessment & Plan Note (Signed)
Referred for colonoscopy Dr. Arlyce Dice Pap 01/2010 wnl needs another pap in 2014 Mammogram 09/2011 neg f/u 1 year  Will need Tdap in the future if not up to date Pending hepatitis studies

## 2011-12-15 NOTE — Assessment & Plan Note (Signed)
Check K today Continue Valium 5 mg prn

## 2011-12-15 NOTE — Progress Notes (Signed)
INTERNAL MEDICINE TEACHING ATTENDING ADDENDUM Lonzo Cloud, MD: I reviewed with the resident Dr.McLean, the patients   medical history, physical examination, diagnosis and results of tests and treatment and I agree with the patient's care as documented.

## 2011-12-16 LAB — HEPATITIS PANEL, ACUTE
Hep B C IgM: NEGATIVE
Hepatitis B Surface Ag: NEGATIVE

## 2011-12-17 ENCOUNTER — Encounter: Payer: Self-pay | Admitting: Internal Medicine

## 2011-12-21 NOTE — Addendum Note (Signed)
Addended by: Hassan Buckler on: 12/21/2011 11:52 AM   Modules accepted: Orders

## 2011-12-22 ENCOUNTER — Telehealth: Payer: Self-pay | Admitting: *Deleted

## 2011-12-22 NOTE — Telephone Encounter (Signed)
Pt wanted to check on appt colonoscopy - note states [facility] states next colonoscopy 2017. Stanton Kidney Farin Buhman RN 12/22/11 4PM

## 2011-12-27 ENCOUNTER — Other Ambulatory Visit: Payer: Self-pay | Admitting: Internal Medicine

## 2012-01-19 ENCOUNTER — Ambulatory Visit (INDEPENDENT_AMBULATORY_CARE_PROVIDER_SITE_OTHER): Payer: Medicaid Other | Admitting: Internal Medicine

## 2012-01-19 ENCOUNTER — Encounter: Payer: Self-pay | Admitting: Internal Medicine

## 2012-01-19 VITALS — BP 126/76 | HR 80 | Temp 97.0°F | Ht 61.0 in | Wt 147.9 lb

## 2012-01-19 DIAGNOSIS — K219 Gastro-esophageal reflux disease without esophagitis: Secondary | ICD-10-CM

## 2012-01-19 DIAGNOSIS — J019 Acute sinusitis, unspecified: Secondary | ICD-10-CM | POA: Insufficient documentation

## 2012-01-19 DIAGNOSIS — B1710 Acute hepatitis C without hepatic coma: Secondary | ICD-10-CM

## 2012-01-19 DIAGNOSIS — N2589 Other disorders resulting from impaired renal tubular function: Secondary | ICD-10-CM

## 2012-01-19 DIAGNOSIS — B1920 Unspecified viral hepatitis C without hepatic coma: Secondary | ICD-10-CM

## 2012-01-19 DIAGNOSIS — Z23 Encounter for immunization: Secondary | ICD-10-CM

## 2012-01-19 DIAGNOSIS — R252 Cramp and spasm: Secondary | ICD-10-CM

## 2012-01-19 DIAGNOSIS — Z0000 Encounter for general adult medical examination without abnormal findings: Secondary | ICD-10-CM

## 2012-01-19 DIAGNOSIS — B20 Human immunodeficiency virus [HIV] disease: Secondary | ICD-10-CM

## 2012-01-19 DIAGNOSIS — J309 Allergic rhinitis, unspecified: Secondary | ICD-10-CM

## 2012-01-19 DIAGNOSIS — E785 Hyperlipidemia, unspecified: Secondary | ICD-10-CM

## 2012-01-19 DIAGNOSIS — I10 Essential (primary) hypertension: Secondary | ICD-10-CM

## 2012-01-19 DIAGNOSIS — B0229 Other postherpetic nervous system involvement: Secondary | ICD-10-CM

## 2012-01-19 MED ORDER — MUCINEX DM 30-600 MG PO TB12: 1.0000 | ORAL_TABLET | Freq: Two times a day (BID) | ORAL | Status: DC | PRN

## 2012-01-19 MED ORDER — AMOXICILLIN-POT CLAVULANATE 875-125 MG PO TABS: 1.0000 | ORAL_TABLET | Freq: Two times a day (BID) | ORAL | Status: AC

## 2012-01-19 NOTE — Assessment & Plan Note (Signed)
Lipid Panel     Component Value Date/Time   CHOL 237* 09/12/2011 1409   TRIG 156* 09/12/2011 1409   HDL 69 09/12/2011 1409   CHOLHDL 3.4 09/12/2011 1409   VLDL 31 09/12/2011 1409   LDLCALC 137* 09/12/2011 1409

## 2012-01-19 NOTE — Assessment & Plan Note (Signed)
Cont elavil

## 2012-01-19 NOTE — Assessment & Plan Note (Addendum)
Given flu vaccine today Disc Tdap in the future, pt only received Td in the past

## 2012-01-19 NOTE — Progress Notes (Signed)
Subjective:    [name], female    DOB: 1960-12-23, 51 y.o.   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  She presents after sx's have been present for 1.5 weeks.  She complains of nasal congestion, sneezing, blowing her nose (clear drainage), coughing clear sputum, "raspy" voice, postnasal drip, and frontal, ethmoid, and maxillary sinus pressure, h/a (2/2 coughing), chest pain (2/2 coughing) .  She reports previous allergy testing via Dr. Beaulah Dinning showed she was allergic to dust, grass, weeds, ragweed, pollen. She is using Flonase, Zyrtec, singular, Pataday eye drops, saline cleanses to nose and Neti Pot with distilled water.  She reports not much relief with the flonase currently.  She had a sick contact with her 83 y.o niece with similar sx's.    She reports nocturnal leg cramps right leg>left leg.  She takes Valium and potassium pills (for Type 1 RTA) which help somewhat.    Lab results relating to Hepatitis C in 2008 to 2013  06/19/2006 Hepatitis B Surface Ag: NO Hep B S Ab: NO HCV Quantitative: 1352000 (?) HCV Ab: YES  12/15/2011 15:32 Hepatitis B Surface Ag: NEGATIVE Hep B S Ab: NEG Hep B C IgM: NEG HCV Quantitative: [id number] (H) HCV Quantitative Log: 6.70 (H) HCV Ab: REACTIVE (A)   SH: pt reports she quit smoking 3-4 years ago.  She does not drive   Cough This is a new problem. The current episode started in the past 7 days. The cough is productive of sputum (clear). Associated symptoms include headaches, nasal congestion, postnasal drip and rhinorrhea. Pertinent negatives include no fever, sore throat or shortness of breath. The symptoms are aggravated by dust and pollens.      Review of Systems  Constitutional: Negative for fever.    HENT: Positive for congestion, rhinorrhea, sneezing and postnasal drip. Negative for sore throat.        Denies sore throat  Dry mouth 2/2 elavil  Respiratory: Positive for cough. Negative for shortness of breath.   Cardiovascular: Negative for leg swelling.  Neurological: Positive for headaches.       Objective:   Physical Exam  Nursing note and vitals reviewed. Constitutional: She is oriented to person, place, and time. Vital signs are normal. She appears well-developed and well-nourished. She is cooperative. No distress.       Pleasant   HENT:  Head: Normocephalic and atraumatic.  Right Ear: Tympanic membrane normal. No drainage.  Left Ear: Tympanic membrane normal. No drainage.  Ears:  Nose: No rhinorrhea. Right sinus exhibits maxillary sinus tenderness and frontal sinus tenderness. Left sinus exhibits maxillary sinus tenderness and frontal sinus tenderness.  Mouth/Throat: Oropharynx is clear and moist and mucous membranes are normal. No oropharyngeal exudate.       Frontal, ethmoid, maxillary sinus ttp and pressure Nasal voice  Swollen nasal turbinates with min. Erythema   Eyes: Conjunctivae normal are normal. Pupils are equal, round, and reactive to light. Right eye exhibits no discharge. Left eye exhibits no discharge. No scleral icterus.  Cardiovascular: Normal rate, regular rhythm, S1 normal, S2 normal, normal heart sounds and intact distal pulses.   No murmur heard. Pulmonary/Chest: Effort normal and breath sounds normal. No respiratory distress. She has no wheezes.  Abdominal: Soft. Normal appearance and bowel sounds are normal. She  exhibits no distension. There is no tenderness.  Neurological: She is alert and oriented to person, place, and time.  Skin: Skin is warm, dry and intact. No rash noted.  Psychiatric: She has a normal mood and affect. Her speech is normal and behavior is normal.          Assessment & Plan:  F/u 3 months

## 2012-01-19 NOTE — Assessment & Plan Note (Signed)
Cont Zyrtec, Saline in nares qd, Flonase qd, Singular qd, Pataday eye drops  Cont. Flonase, Considered Dymista but may interfere with protease inhibitors for HIV so will avoid

## 2012-01-19 NOTE — Assessment & Plan Note (Signed)
Cont Protonix.  

## 2012-01-19 NOTE — Assessment & Plan Note (Signed)
Most recent K 3.6  Cont K supplementation daily

## 2012-01-19 NOTE — Patient Instructions (Signed)
Start taking your two new medications, call if you are not better w/in 1 week  Follow up in 3 months, sooner if needed   Allergic Rhinitis Allergic rhinitis is when the mucous membranes in the nose respond to allergens. Allergens are particles in the air that cause your body to have an allergic reaction. This causes you to release allergic antibodies. Through a chain of events, these eventually cause you to release histamine into the blood stream (hence the use of antihistamines). Although meant to be protective to the body, it is this release that causes your discomfort, such as frequent sneezing, congestion and an itchy runny nose.  CAUSES  The pollen allergens may come from grasses, trees, and weeds. This is seasonal allergic rhinitis, or "hay fever." Other allergens cause year-round allergic rhinitis (perennial allergic rhinitis) such as house dust mite allergen, pet dander and mold spores.  SYMPTOMS   Nasal stuffiness (congestion).   Runny, itchy nose with sneezing and tearing of the eyes.   There is often an itching of the mouth, eyes and ears.  It cannot be cured, but it can be controlled with medications. DIAGNOSIS  If you are unable to determine the offending allergen, skin or blood testing may find it. TREATMENT   Avoid the allergen.   Medications and allergy shots (immunotherapy) can help.   Hay fever may often be treated with antihistamines in pill or nasal spray forms. Antihistamines block the effects of histamine. There are over-the-counter medicines that may help with nasal congestion and swelling around the eyes. Check with your caregiver before taking or giving this medicine.  If the treatment above does not work, there are many new medications your caregiver can prescribe. Stronger medications may be used if initial measures are ineffective. Desensitizing injections can be used if medications and avoidance fails. Desensitization is when a patient is given ongoing shots until  the body becomes less sensitive to the allergen. Make sure you follow up with your caregiver if problems continue. SEEK MEDICAL CARE IF:   You develop fever (more than 100.5 F (38.1 C).   You develop a cough that does not stop easily (persistent).   You have shortness of breath.   You start wheezing.   Symptoms interfere with normal daily activities.  Document Released: 01/04/2001 Document Revised: 03/31/2011 Document Reviewed: 07/16/2008 [facility] Patient Information 2012 [place], [place].Sinusitis Sinusitis an infection of the air pockets (sinuses) in your face. This can cause puffiness (swelling). It can also cause drainage from your sinuses.  HOME CARE   Only take medicine as told by your doctor.   Drink enough fluids to keep your pee (urine) clear or pale yellow.   Apply moist heat or ice packs for pain relief.   Use salt (saline) nose sprays. The spray will wet the thick fluid in the nose. This can help the sinuses drain.  GET HELP RIGHT AWAY IF:   You have a fever.   Your baby is older than 3 months with a rectal temperature of 102 F (38.9 C) or higher.   Your baby is 65 months old or younger with a rectal temperature of 100.4 F (38 C) or higher.   The pain gets worse.   You get a very bad headache.   You keep throwing up (vomiting).   Your face gets puffy.  MAKE SURE YOU:   Understand these instructions.   Will watch your condition.   Will get help right away if you are not doing well or get  worse.  Document Released: 09/28/2007 Document Revised: 03/31/2011 Document Reviewed: 09/28/2007 [facility] Patient Information 2012 [place], [place].Leg Cramps Leg cramps that occur during exercise can be caused by poor circulation or dehydration. However, muscle cramps that occur at rest or during the night are usually not due to any serious medical problem. Heat cramps may cause muscle spasms during hot weather.  CAUSES There is no clear cause for muscle cramps.  However, dehydration may be a factor for those who do not drink enough fluids and those who exercise in the heat. Imbalances in the level of sodium, potassium, calcium or magnesium in the muscle tissue may also be a factor. Some medications, such as water pills (diuretics), may cause loss of chemicals that the body needs (like sodium and potassium) and cause muscle cramps. TREATMENT   Make sure your diet has enough fluids and essential minerals for the muscle to work normally.   Avoid strenuous exercise for several days if you have been having frequent leg cramps.   Stretch and massage the cramped muscle for several minutes.   Some medicines may be helpful in some patients with night cramps. Only take over-the-counter or prescription medicines as directed by your caregiver.  SEEK IMMEDIATE MEDICAL CARE IF:   Your leg cramps become worse.   Your foot becomes cold, numb, or blue.  Document Released: 05/19/2004 Document Revised: 03/31/2011 Document Reviewed: 05/06/2008 [facility] Patient Information 2012 [place], [place].

## 2012-01-19 NOTE — Assessment & Plan Note (Signed)
Ab and Quant in 2008 noted  Repeat Ab and Quant Count + hepatitis C virus Will refer to [facility] for further w/u and management

## 2012-01-19 NOTE — Assessment & Plan Note (Signed)
Given info about nocturnal cramps Will not tx further

## 2012-01-19 NOTE — Assessment & Plan Note (Addendum)
Controlled cd4 ct 630 quant <20 Follows with ID Cont. Truvada, Norvir, Lexiva

## 2012-01-19 NOTE — Assessment & Plan Note (Addendum)
BP 147/89, repeat BP 126/76 Cont Norvasc

## 2012-01-19 NOTE — Assessment & Plan Note (Signed)
On going x 10 days Rx Augementin 875-125 mg bid x 5 days  Rx Mucinex DM  Use Naproxen for sinus pressure  Cont Allergic rhinitis tx  RV if not improving

## 2012-01-23 NOTE — Addendum Note (Signed)
Addended by: Annett Gula on: 01/23/2012 08:44 AM   Modules accepted: Orders

## 2012-01-24 NOTE — Progress Notes (Signed)
INTERNAL MEDICINE TEACHING ATTENDING ADDENDUM [name] [name] , MD: I personally saw and evaluated  [name] [name] in this clinic visit in conjunction with the resident, [name] [name] . I have discussed the patient's plan of care with [name] [name] during this visit. I have confirmed the physical exam findings and have read and agree with the clinic note including the plan.

## 2012-01-25 ENCOUNTER — Other Ambulatory Visit: Payer: Self-pay | Admitting: *Deleted

## 2012-01-25 DIAGNOSIS — N2589 Other disorders resulting from impaired renal tubular function: Secondary | ICD-10-CM

## 2012-01-25 MED ORDER — POTASSIUM CHLORIDE 40 MEQ/15ML (20%) PO LIQD: 40.0000 meq | Freq: Every morning | ORAL | Status: DC

## 2012-01-26 ENCOUNTER — Other Ambulatory Visit: Payer: Self-pay | Admitting: *Deleted

## 2012-01-26 MED ORDER — ALBUTEROL SULFATE HFA 108 (90 BASE) MCG/ACT IN AERS: 2.0000 | INHALATION_SPRAY | Freq: Four times a day (QID) | RESPIRATORY_TRACT | Status: DC | PRN

## 2012-02-02 NOTE — Addendum Note (Signed)
Addended by: Neomia Dear on: 02/02/2012 06:13 PM   Modules accepted: Orders

## 2012-02-17 ENCOUNTER — Ambulatory Visit: Payer: Medicaid Other | Admitting: Gastroenterology

## 2012-02-21 ENCOUNTER — Other Ambulatory Visit: Payer: Self-pay | Admitting: *Deleted

## 2012-02-21 DIAGNOSIS — I10 Essential (primary) hypertension: Secondary | ICD-10-CM

## 2012-02-21 MED ORDER — AMLODIPINE BESYLATE 10 MG PO TABS: 10.0000 mg | ORAL_TABLET | Freq: Every day | ORAL | Status: DC

## 2012-02-21 MED ORDER — TRAMADOL HCL 50 MG PO TABS: ORAL_TABLET | ORAL | Status: DC

## 2012-03-16 DIAGNOSIS — B20 Human immunodeficiency virus [HIV] disease: Secondary | ICD-10-CM | POA: Insufficient documentation

## 2012-03-16 DIAGNOSIS — B182 Chronic viral hepatitis C: Secondary | ICD-10-CM | POA: Insufficient documentation

## 2012-03-16 DIAGNOSIS — Z8619 Personal history of other infectious and parasitic diseases: Secondary | ICD-10-CM | POA: Insufficient documentation

## 2012-03-21 ENCOUNTER — Telehealth: Payer: Self-pay | Admitting: Internal Medicine

## 2012-03-21 ENCOUNTER — Other Ambulatory Visit: Payer: Self-pay | Admitting: *Deleted

## 2012-03-21 MED ORDER — DIAZEPAM 5 MG PO TABS: 5.0000 mg | ORAL_TABLET | Freq: Every evening | ORAL | Status: DC | PRN

## 2012-03-21 NOTE — Telephone Encounter (Signed)
Called pharmacy [phone] Rx refill Valium 5 mg qhs prn muscle spasm #30 RF x 5  Shirlee Latch MD

## 2012-03-26 NOTE — Telephone Encounter (Signed)
Rx faxed in.

## 2012-04-02 ENCOUNTER — Ambulatory Visit: Payer: Medicaid Other

## 2012-04-02 ENCOUNTER — Other Ambulatory Visit: Payer: Medicaid Other

## 2012-04-09 ENCOUNTER — Other Ambulatory Visit (HOSPITAL_COMMUNITY)
Admission: RE | Admit: 2012-04-09 | Discharge: 2012-04-09 | Disposition: A | Discharge status: Home / Self Care | Payer: Medicaid Other | Source: Ambulatory Visit | Attending: Infectious Diseases | Admitting: Infectious Diseases

## 2012-04-09 ENCOUNTER — Ambulatory Visit (INDEPENDENT_AMBULATORY_CARE_PROVIDER_SITE_OTHER): Payer: Medicaid Other | Admitting: *Deleted

## 2012-04-09 DIAGNOSIS — Z124 Encounter for screening for malignant neoplasm of cervix: Secondary | ICD-10-CM

## 2012-04-09 DIAGNOSIS — Z01419 Encounter for gynecological examination (general) (routine) without abnormal findings: Secondary | ICD-10-CM | POA: Insufficient documentation

## 2012-04-09 NOTE — Progress Notes (Signed)
  Subjective:     [name] [name] is a 51 y.o. woman who comes in today for a  pap smear only. . Previous abnormal Pap smears: no. Contraception: abstinent.  Being seen at [facility] for Hepatitis C.  Pelvic Exam:  Pap smear obtained.   Assessment:    Screening pap smear.   Plan:    Follow up in one year, or as indicated by Pap results.  Pt given educational materials re: HIV and women, patients over 83 years old, PAP smear, ACA, BSE, Hepatitis C and HIV and self-esteem.  Pt declined condoms.   Not due for Mammogram until 08/2012.

## 2012-04-09 NOTE — Patient Instructions (Addendum)
Your results will be ready in about a week.  You may look up your results in MyChart.  Thank you for coming to the Center for your care.  Happy Alene Mires, [place]

## 2012-04-10 ENCOUNTER — Other Ambulatory Visit: Payer: Self-pay | Admitting: Internal Medicine

## 2012-04-11 ENCOUNTER — Other Ambulatory Visit: Payer: Self-pay | Admitting: *Deleted

## 2012-04-11 MED ORDER — MONTELUKAST SODIUM 10 MG PO TABS: 10.0000 mg | ORAL_TABLET | Freq: Every day | ORAL | Status: DC

## 2012-04-11 MED ORDER — CETIRIZINE HCL 10 MG PO TABS: 10.0000 mg | ORAL_TABLET | Freq: Every day | ORAL | Status: DC | PRN

## 2012-04-11 MED ORDER — PANTOPRAZOLE SODIUM 20 MG PO TBEC: 20.0000 mg | DELAYED_RELEASE_TABLET | Freq: Two times a day (BID) | ORAL | Status: DC

## 2012-04-11 NOTE — Telephone Encounter (Signed)
Also needs refill on Fluconazole 150mg  #2.

## 2012-04-12 ENCOUNTER — Encounter: Payer: Self-pay | Admitting: Internal Medicine

## 2012-04-12 ENCOUNTER — Ambulatory Visit (INDEPENDENT_AMBULATORY_CARE_PROVIDER_SITE_OTHER): Payer: Medicaid Other | Admitting: Internal Medicine

## 2012-04-12 VITALS — BP 147/95 | HR 86 | Temp 97.4°F | Ht 61.0 in | Wt 147.4 lb

## 2012-04-12 DIAGNOSIS — R252 Cramp and spasm: Secondary | ICD-10-CM

## 2012-04-12 DIAGNOSIS — I10 Essential (primary) hypertension: Secondary | ICD-10-CM

## 2012-04-12 DIAGNOSIS — B1710 Acute hepatitis C without hepatic coma: Secondary | ICD-10-CM

## 2012-04-12 DIAGNOSIS — N2589 Other disorders resulting from impaired renal tubular function: Secondary | ICD-10-CM

## 2012-04-12 DIAGNOSIS — R5383 Other fatigue: Secondary | ICD-10-CM

## 2012-04-12 DIAGNOSIS — Z0000 Encounter for general adult medical examination without abnormal findings: Secondary | ICD-10-CM

## 2012-04-12 DIAGNOSIS — B1920 Unspecified viral hepatitis C without hepatic coma: Secondary | ICD-10-CM

## 2012-04-12 NOTE — Assessment & Plan Note (Signed)
Continue K supplementation K normal 4.2 on 03/16/12 at [facility]

## 2012-04-12 NOTE — Progress Notes (Signed)
  Subjective:    [name], female    DOB: 1961-01-23, 51 y.o.   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 Wyvonnia Lora).  Reviewed the records which she has had 2 visits.  US abdomen 12/9 negative for cirrhosis but showed a right renal cyst.  Hepatitis C viral count was >1.7 million.  She also had H/H which was 14.9/46.5 (normal) and on 03/16/12 she had a normal potassium of 4.2.  Alkaline Phosphatase was elevated at 130 and ALT elevated at 52  She complains of fatigue (tsh normal 04/2011), decreased sleep (having trouble falling and staying asleep sleeping 5 hours daily).  She complains of muscle cramps qhs which Valium is helping.  She has to move her legs continuously at night.  She mentions she recently saw Dr. Lonzo Cloud (?ENT) for pressure like sensation in her ears because she has tubes in her ears and she was wondering if they were still in intact and they are indeed.  She would like to speak with Rudell Cobb today about a copay of $3 and she did not think she had a copay with the [employer].       Review of Systems  Constitutional: Positive for fatigue.  HENT:       Ear pressure Sinusitis improved   Respiratory: Negative for shortness of breath.   Cardiovascular: Negative for chest pain.  Gastrointestinal:       Fullness in her abdomen on the right  Musculoskeletal:       Nocturnal leg cramps  Psychiatric/Behavioral: Positive for sleep disturbance.       Objective:   Physical Exam  Nursing note and vitals reviewed. Constitutional: She is oriented to person, place, and time. She appears well-developed and well-nourished. She is cooperative. No distress.       Pleasant   HENT:  Head: Normocephalic and atraumatic.  Mouth/Throat: Oropharynx is clear and moist and mucous membranes are normal. No  oropharyngeal exudate.  Eyes: Conjunctivae normal are normal. Pupils are equal, round, and reactive to light. Right eye exhibits no discharge. Left eye exhibits no discharge. No scleral icterus.  Cardiovascular: Normal rate, regular rhythm, S1 normal, S2 normal and normal heart sounds.   No murmur heard. Pulmonary/Chest: Effort normal and breath sounds normal. No respiratory distress. She has no wheezes.  Abdominal: Soft. Normal appearance and bowel sounds are normal. She exhibits no distension. There is no tenderness.  Neurological: She is alert and oriented to person, place, and time. No cranial nerve deficit.  Skin: Skin is warm, dry and intact. She is not diaphoretic.  Psychiatric: She has a normal mood and affect. Her speech is normal and behavior is normal. Judgment and thought content normal. Cognition and memory are normal.          Assessment & Plan:  F/u 4-6 months

## 2012-04-12 NOTE — Assessment & Plan Note (Signed)
Mammogram normal She follows with Dr. Arlyce Dice and colonoscopy not due until 2016

## 2012-04-12 NOTE — Patient Instructions (Addendum)
I reviewed the notes from Dr. Wyvonnia Lora.  The Ultrasound of your abdomen 12/9 was negative for cirrhosis or liver scarring but showed a right renal cyst.  Hepatitis C viral count was >1.7 million.  Some of your liver enzymes were elevated like Alkaline Phosphatase was elevated at 130 (mildly) and ALT elevated at 52 (mildly).  Continue to follow up with Dr. Wyvonnia Lora like you are doing.  Take care.  Happy Holidays   Hepatitis C Hepatitis C is a liver infection. It is caused by a germ called hepatitis C virus (HCV). This germ enters the body through blood and other bodily fluids. HOME CARE  Do not drink alcohol.  Only take medicines as told by your doctor.  Keep all blood test visits as told by your doctor.  Do not have sex until your doctor says it is okay.  Avoid activities that could put other people in contact with your blood. Do not share a toothbrush, nail clippers, razors, or needles.  Follow your doctor's advice to avoid spreading the infection to others. GET HELP RIGHT AWAY IF:  You feel very tired or weak.  You have a temperature by mouth above 102 F (38.9 C), not controlled by medicine.  You do not feel like eating (loss of appetite).  You feel sick to your stomach (nauseous) or throw up (vomit).  Your skin or the whites of your eyes turn yellow (jaundice).  You bruise or bleed easily.  You have any severe problems after treatment. MAKE SURE YOU:  Understand these instructions.  Will watch your condition.  Will get help right away if you are not doing well or get worse. Document Released: 03/24/2008 Document Revised: 07/04/2011 Document Reviewed: 08/12/2010 [facility] Patient Information 2013 [place], [place]. Muscle Cramps Muscle cramps are when muscles tighten by themselves. Muscle cramps usually improve or go away within minutes. HOME CARE  Massage the muscle.  Stretch the muscle.  Relax the muscle.  Only take medicine as told by your doctor.  Drink enough  fluids to keep your pee (urine) clear or pale yellow. GET HELP RIGHT AWAY IF:  Cramps are frequent and do not get better with medicine. MAKE SURE YOU:  Understand these intructions.  Will watch your condition.  Will get help right away if your are not doing well or get worse. Document Released: 03/24/2008 Document Revised: 07/04/2011 Document Reviewed: 04/02/2008 [facility] Patient Information 2013 [place], [place].   Hypertension Hypertension is another name for high blood pressure. High blood pressure may mean that your heart needs to work harder to pump blood. Blood pressure consists of two numbers, which includes a higher number over a lower number (example: 110/72). HOME CARE   Make lifestyle changes as told by your doctor. This may include weight loss and exercise.  Take your blood pressure medicine every day.  Limit how much salt you use.  Stop smoking if you smoke.  Do not use drugs.  Talk to your doctor if you are using decongestants or birth control pills. These medicines might make blood pressure higher.  Females should not drink more than 1 alcoholic drink per day. Males should not drink more than 2 alcoholic drinks per day.  See your doctor as told. GET HELP RIGHT AWAY IF:   You have a blood pressure reading with a top number of 180 or higher.  You get a very bad headache.  You get blurred or changing vision.  You feel confused.  You feel weak, numb, or faint.  You get  chest or belly (abdominal) pain.  You throw up (vomit).  You cannot breathe very well. MAKE SURE YOU:   Understand these instructions.  Will watch your condition.  Will get help right away if you are not doing well or get worse. Document Released: 09/28/2007 Document Revised: 07/04/2011 Document Reviewed: 09/28/2007 [facility] Patient Information 2013 [place], [place].    General Instructions: Please bring your medications to each visit     Treatment Goals:  Goals (1 Years of  Data) as of 04/12/2012    None  <140/90    Progress Toward Treatment Goals:  Treatment Goal 04/12/2012  Blood pressure at goal  Other  at goal    Self Care Goals & Plans:  Self Care Goal 04/12/2012  Manage my medications take my medicines as prescribed; bring my medications to every visit; refill my medications on time  Monitor my health keep track of my blood pressure  Eat healthy foods eat more vegetables; eat foods that are low in salt; drink diet soda or water instead of juice or soda  Be physically active find workout friends       Care Management & Community Referrals:  Referral 04/12/2012  Referrals made for care management support social [facility]

## 2012-04-12 NOTE — Assessment & Plan Note (Signed)
Slightly elevated today 142/92 with repeat 145/97 Will monitor and continue Norvasc 5 mg daily for now.

## 2012-04-12 NOTE — Assessment & Plan Note (Signed)
Continue with Valium prn.  It is helping Due to having restless legs at night I was considering an anemia panel but H/H was norma 04/02/12 at [facility] 14.9/46.5

## 2012-04-12 NOTE — Assessment & Plan Note (Signed)
TSH in 04/2011 was normal.  This could be secondary to sedentary lifestyle though she does exercise a little with friends

## 2012-04-12 NOTE — Assessment & Plan Note (Addendum)
Following with Dr. Wyvonnia Lora at [facility] will continue to follow no treatment as of yet  Liver biopsy planned at the end of 03/2012  Reviewed records:  She has had 2 visits.  US abdomen 12/9 negative for cirrhosis but showed a right renal cyst.  Hepatitis C viral count was >1.7 million.  She also had H/H which was 14.9/46.5 (normal) and on 03/16/12 she had a normal potassium of 4.2.  Alkaline Phosphatase was elevated at 130 and ALT elevated at 52

## 2012-04-16 ENCOUNTER — Encounter: Payer: Self-pay | Admitting: *Deleted

## 2012-04-17 ENCOUNTER — Other Ambulatory Visit: Payer: Self-pay | Admitting: Internal Medicine

## 2012-05-02 ENCOUNTER — Other Ambulatory Visit (INDEPENDENT_AMBULATORY_CARE_PROVIDER_SITE_OTHER): Payer: Medicaid Other

## 2012-05-02 DIAGNOSIS — B20 Human immunodeficiency virus [HIV] disease: Secondary | ICD-10-CM

## 2012-05-02 LAB — CBC WITH DIFFERENTIAL/PLATELET
Basophils Absolute: 0 10*3/uL (ref 0.0–0.1)
Basophils Relative: 0 % (ref 0–1)
Eosinophils Relative: 1 % (ref 0–5)
Lymphocytes Relative: 28 % (ref 12–46)
MCHC: 33.3 g/dL (ref 30.0–36.0)
Monocytes Absolute: 1 10*3/uL (ref 0.1–1.0)
Neutro Abs: 6.4 10*3/uL (ref 1.7–7.7)
Platelets: 251 10*3/uL (ref 150–400)
RDW: 16.5 % — ABNORMAL HIGH (ref 11.5–15.5)
WBC: 10.5 10*3/uL (ref 4.0–10.5)

## 2012-05-02 LAB — COMPREHENSIVE METABOLIC PANEL
ALT: 21 U/L (ref 0–35)
AST: 20 U/L (ref 0–37)
Alkaline Phosphatase: 105 U/L (ref 39–117)
BUN: 9 mg/dL (ref 6–23)
Calcium: 9.6 mg/dL (ref 8.4–10.5)
Chloride: 103 mEq/L (ref 96–112)
Creat: 0.64 mg/dL (ref 0.50–1.10)

## 2012-05-03 LAB — HIV-1 RNA QUANT-NO REFLEX-BLD: HIV-1 RNA Quant, Log: 1.32 {Log} — ABNORMAL HIGH (ref <1.30)

## 2012-05-10 ENCOUNTER — Other Ambulatory Visit: Payer: Self-pay | Admitting: Internal Medicine

## 2012-05-16 ENCOUNTER — Encounter: Payer: Self-pay | Admitting: Infectious Diseases

## 2012-05-16 ENCOUNTER — Ambulatory Visit (INDEPENDENT_AMBULATORY_CARE_PROVIDER_SITE_OTHER): Payer: Medicaid Other | Admitting: Infectious Diseases

## 2012-05-16 VITALS — BP 164/100 | HR 105 | Temp 97.9°F | Ht 61.0 in | Wt 146.0 lb

## 2012-05-16 DIAGNOSIS — B20 Human immunodeficiency virus [HIV] disease: Secondary | ICD-10-CM

## 2012-05-16 DIAGNOSIS — I10 Essential (primary) hypertension: Secondary | ICD-10-CM

## 2012-05-16 DIAGNOSIS — Z21 Asymptomatic human immunodeficiency virus [HIV] infection status: Secondary | ICD-10-CM

## 2012-05-16 DIAGNOSIS — Z79899 Other long term (current) drug therapy: Secondary | ICD-10-CM

## 2012-05-16 DIAGNOSIS — B1710 Acute hepatitis C without hepatic coma: Secondary | ICD-10-CM

## 2012-05-16 DIAGNOSIS — Z113 Encounter for screening for infections with a predominantly sexual mode of transmission: Secondary | ICD-10-CM

## 2012-05-16 MED ORDER — RITONAVIR 100 MG PO TABS: 100.0000 mg | ORAL_TABLET | Freq: Every day | ORAL | Status: DC

## 2012-05-16 MED ORDER — DARUNAVIR ETHANOLATE 800 MG PO TABS: 800.0000 mg | ORAL_TABLET | Freq: Every day | ORAL | Status: DC

## 2012-05-16 NOTE — Progress Notes (Signed)
  Subjective:    [name], female    DOB: 1960/09/21, 52 y.o.   MRN: [id number]  HPI 52 yo F with hx of Hep C and HIV+ (LXVr/TRV). She had nasal septoplasty 01-05-10. Has bulging disc but is getting conservative rx for this. Has been feeling fatigued, fell off back porch due to frost, and got multiple bruises.  Has had f/u at [facility] for Hep C. 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 She is set to start sofosbuvir and ___.  She is starting to have difficulty with "buffalo neck soreness'. BP has been well controlled, "today is as high as it's ever been".    Review of Systems  Constitutional: Positive for fatigue. Negative for appetite change and unexpected weight change.  Cardiovascular: Negative for chest pain.  Gastrointestinal: Negative for nausea and diarrhea.  Genitourinary: Negative for difficulty urinating.  Neurological: Negative for headaches.       Objective:   Physical Exam  Constitutional: She appears well-developed and well-nourished.  HENT:  Mouth/Throat: No oropharyngeal exudate.  Eyes: EOM are normal. Pupils are equal, round, and reactive to light.  Neck: Neck supple.  Cardiovascular: Normal rate, regular rhythm and normal heart sounds.   Pulmonary/Chest: Effort normal and breath sounds normal.  Abdominal: Soft. Bowel sounds are normal. There is no tenderness. There is no rebound.  Musculoskeletal: She exhibits no edema.  Lymphadenopathy:    She has no cervical adenopathy.  Skin:       Bruising on L forearm and R ankle.           Assessment & Plan:

## 2012-05-16 NOTE — Assessment & Plan Note (Signed)
She will f/u with IM. Greatly appreciate their partnering with [place]. She attributes her headaches and BP to her brother having moved in with her.

## 2012-05-16 NOTE — Assessment & Plan Note (Signed)
Greatly appreciate eval by [facility]. Will change her art to be consistent with her Hep C rx.  She has received 4 doses of the Hep A vaccine without response, will not rechallenge.

## 2012-05-16 NOTE — Assessment & Plan Note (Signed)
She is very well controlled. She had PAP in December. Her vax are up to date.  With regards to her ART, she has been positive for many years and been on LXVr for many years as well. 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 I suspect it is from being positive for many years. As well, i explained to her that this is unlikely to improve with changing her medications given its long standing nature. Will change her to DRVr due to upcoming Hep C rx. Some concern that her hx of allergic reaction to diuretic will predisopose her to allergic reaction to DRV (sulfa?).  Will see her back in 3-4 months to assess her tolerance of her new ART and its efficacy.

## 2012-06-11 ENCOUNTER — Other Ambulatory Visit: Payer: Self-pay | Admitting: Licensed Clinical Social Worker

## 2012-06-11 DIAGNOSIS — B20 Human immunodeficiency virus [HIV] disease: Secondary | ICD-10-CM

## 2012-06-11 MED ORDER — EMTRICITABINE-TENOFOVIR DF 200-300 MG PO TABS: 1.0000 | ORAL_TABLET | Freq: Every day | ORAL | Status: DC

## 2012-06-19 ENCOUNTER — Encounter: Payer: Self-pay | Admitting: Physical Medicine & Rehabilitation

## 2012-07-09 ENCOUNTER — Encounter: Payer: Self-pay | Admitting: Physical Medicine & Rehabilitation

## 2012-07-09 ENCOUNTER — Ambulatory Visit (HOSPITAL_BASED_OUTPATIENT_CLINIC_OR_DEPARTMENT_OTHER): Payer: Medicaid Other | Admitting: Physical Medicine & Rehabilitation

## 2012-07-09 ENCOUNTER — Encounter: Payer: Medicaid Other | Attending: Physical Medicine & Rehabilitation

## 2012-07-09 VITALS — Resp 14 | Ht 61.0 in | Wt 148.0 lb

## 2012-07-09 DIAGNOSIS — M6281 Muscle weakness (generalized): Secondary | ICD-10-CM | POA: Insufficient documentation

## 2012-07-09 DIAGNOSIS — M545 Low back pain, unspecified: Secondary | ICD-10-CM | POA: Insufficient documentation

## 2012-07-09 DIAGNOSIS — Z5181 Encounter for therapeutic drug level monitoring: Secondary | ICD-10-CM

## 2012-07-09 DIAGNOSIS — G8929 Other chronic pain: Secondary | ICD-10-CM | POA: Insufficient documentation

## 2012-07-09 LAB — SEDIMENTATION RATE: Sed Rate: 27 mm/hr — ABNORMAL HIGH (ref 0–22)

## 2012-07-09 LAB — CK: Total CK: 96 U/L (ref 7–177)

## 2012-07-09 NOTE — Progress Notes (Signed)
Subjective:    [name], female    DOB: 04-15-1961, 52 y.o.   MRN: [id number]  HPI Muscle aches since started Prezista Jan 2014 Has  Chronic low back pain Tried therapy for back pain last year which was helpful She is interested in trying physical therapy for her back problems as well as weakness in the shoulders and hip area Is on multiple medications for HIV and hepatitis C would like to avoid medication for back pain. Pain Inventory Average Pain 7 Pain Right Now 8 My pain is aching  In the last 24 hours, has pain interfered with the following? General activity 7 Relation with others 7 Enjoyment of life 8 What TIME of day is your pain at its worst? day and night Sleep (in general) Poor  Pain is worse with: walking, bending and some activites Pain improves with: injections Relief from Meds: 8  Mobility how many minutes can you walk? 15 ability to climb steps?  yes do you drive?  no transfers alone  Function disabled: date disabled 20 I need assistance with the following:  household duties and shopping Do you have any goals in this area?  yes  Neuro/Psych weakness trouble walking spasms  Prior Studies Any changes since last visit?  no Clinical Data: Back pain. Previous injury.  LUMBAR SPINE - COMPLETE 4+ VIEW  Comparison: None  Findings: Five lumbar-type vertebral bodies show normal alignment.  Disc space heights are normal. There is mild facet degeneration  and L4-5 and L5-S1. No pars defect or slippage. No specific post-  traumatic finding. Sacroiliac joints appear normal.  IMPRESSION:  Lower lumbar facet degeneration. Otherwise normal. The findings  could certainly relate to back pain.  Physicians involved in your care Desma Maxim, Naaman Plummer.   Family History  Problem Relation Age of Onset  . Heart disease Mother   . Diabetes Mother   . Stroke Mother   . Heart disease Father   . Stroke Father   . Diabetes Father   . Hepatitis  Sister     hcv  . Stroke Other    History   Social History  . Marital Status: Single    Spouse Name: N/A    Number of Children: 0  . Years of Education: N/A   Social History Main Topics  . Smoking status: Former Smoker    Types: Cigarettes    Quit date: 04/26/2007  . Smokeless tobacco: Never Used     Comment: QUIT 2009  . Alcohol Use: No  . Drug Use: No  . Sexually Active: Yes -- Female partner(s)     Comment: pt. given condoms   Other Topics Concern  . None   Social History Narrative   Alternate # [phone]   Past Surgical History  Procedure Laterality Date  . Colectomy      2003 for diverticulitis  . Hand surgery    . Bunionectomy      b/l  . Shoulder surgery      left  . Tonsillectomy    . Nasal sinus surgery     Past Medical History  Diagnosis Date  . HIV infection     1994  . Hepatitis C   . Hypertension   . GERD (gastroesophageal reflux disease)   . Allergy   . Depression   . CHF (congestive heart failure)   . Diverticulitis   . Arthritis   . Chronic back pain   . Hyperlipidemia   . Muscle cramps at  night   . History of shingles    Resp 14  Ht 5\' 1"  (1.549 m)  Wt 148 lb (67.132 kg)  BMI 27.98 kg/m2     Review of Systems  Constitutional: Positive for diaphoresis and appetite change.  Cardiovascular: Positive for leg swelling.  Gastrointestinal: Positive for nausea.  Musculoskeletal: Positive for back pain and gait problem.  Neurological: Positive for weakness.  All other systems reviewed and are negative.       Objective:   Physical Exam  Nursing note and vitals reviewed. Constitutional: She is oriented to person, place, and time. She appears well-developed and well-nourished.  HENT:  Head: Normocephalic and atraumatic.  Eyes: Conjunctivae and EOM are normal. Pupils are equal, round, and reactive to light.  Neck: Neck supple.  Musculoskeletal: Normal range of motion. She exhibits no edema.  Neurological: She is alert and  oriented to person, place, and time. She has normal reflexes.  Skin: Skin is warm and dry.  Psychiatric: She has a normal mood and affect. Her behavior is normal. Judgment and thought content normal.   Motor strength is 4/5 in bilateral deltoid biceps triceps 5/5 bilateral grip 4/5 bilateral hip flexors and 5/5 in bilateral knee extensors ankle dorsiflexors and plantar flexors Sensory exam is normal to pinprick and light touch in both upper and lower extremities. There is no evidence of intrinsic muscle atrophy in the hands or in the feet. Inhalation is without evidence of toe drag or knee instability General no acute distress There is no evidence of muscle fasciculation       Assessment & Plan:  1. Chronic low back pain. Exam is unremarkable. X-ray evidence of spondylosis. She would benefit from medial branch blocks to further evaluate the etiology of low back pain.Physical therapy with lumbar flexion exercises would be helpful in this situation. 2. Muscle weakness proximal with minimal physical findings. No pains in proximal muscles to indicate an inflammatory myositis. 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 Will check CK and sed rate,Will ask PT to do reconditioning program no more than moderate resistance Return to clinic 2 weeks for medial branch blocks L3 L4-L5

## 2012-07-09 NOTE — Patient Instructions (Signed)
Physical therapy for back pain as well as strengthening in the shoulders as well as hip area Injections for lumbar spine arthritis

## 2012-07-11 ENCOUNTER — Telehealth: Payer: Self-pay

## 2012-07-11 NOTE — Telephone Encounter (Signed)
Patient informed of normal bloodwork.

## 2012-07-11 NOTE — Telephone Encounter (Signed)
Message copied by Judd Gaudier on Wed Jul 11, 2012  9:03 AM ------      Message from: Su Monks      Created: Tue Jul 10, 2012  4:13 PM       Inform patient that bloodwork was normal ------

## 2012-07-13 ENCOUNTER — Telehealth: Payer: Self-pay

## 2012-07-13 NOTE — Telephone Encounter (Signed)
Message copied by Judd Gaudier on Fri Jul 13, 2012  9:19 AM ------      Message from: Su Monks      Created: Thu Jul 12, 2012  3:40 PM       This is a new patient, she should be non narcotic, or ask Dr. Doroteo Bradford whether he wants to d/c her, when he comes back. ------

## 2012-07-16 NOTE — Telephone Encounter (Signed)
I'm sending the patient to physical therapy and may require medial branch blocks when I next evaluate her

## 2012-07-25 ENCOUNTER — Ambulatory Visit: Payer: Medicaid Other | Attending: Physical Medicine & Rehabilitation | Admitting: Physical Therapy

## 2012-07-25 DIAGNOSIS — G8929 Other chronic pain: Secondary | ICD-10-CM | POA: Insufficient documentation

## 2012-07-25 DIAGNOSIS — M545 Low back pain, unspecified: Secondary | ICD-10-CM | POA: Insufficient documentation

## 2012-07-25 DIAGNOSIS — M6281 Muscle weakness (generalized): Secondary | ICD-10-CM | POA: Insufficient documentation

## 2012-07-25 DIAGNOSIS — IMO0001 Reserved for inherently not codable concepts without codable children: Secondary | ICD-10-CM | POA: Insufficient documentation

## 2012-07-26 ENCOUNTER — Encounter: Payer: Self-pay | Admitting: Physical Medicine & Rehabilitation

## 2012-07-26 ENCOUNTER — Ambulatory Visit (HOSPITAL_BASED_OUTPATIENT_CLINIC_OR_DEPARTMENT_OTHER): Payer: Medicaid Other | Admitting: Physical Medicine & Rehabilitation

## 2012-07-26 ENCOUNTER — Encounter: Payer: Medicaid Other | Attending: Physical Medicine & Rehabilitation

## 2012-07-26 VITALS — BP 153/84 | HR 92 | Resp 14 | Ht 61.0 in | Wt 144.8 lb

## 2012-07-26 DIAGNOSIS — M545 Low back pain, unspecified: Secondary | ICD-10-CM | POA: Insufficient documentation

## 2012-07-26 DIAGNOSIS — M47817 Spondylosis without myelopathy or radiculopathy, lumbosacral region: Secondary | ICD-10-CM

## 2012-07-26 NOTE — Progress Notes (Signed)
Bilateral Lumbar L3, L4  medial branch blocks and L 5 dorsal ramus injection under fluoroscopic guidance  Indication: Lumbar pain which is not relieved by medication management or other conservative care and interfering with self-care and mobility.  Informed consent was obtained after describing risks and benefits of the procedure with the patient, this includes bleeding, infection, paralysis and medication side effects.  The patient wishes to proceed and has given written consent. The patient has well-controlled HIV. Reviewed most recent lab work. The patient was placed in prone position.  The lumbar area was marked and prepped with Betadine.  One mL of 1% lidocaine was injected into each of 6 areas into the skin and subcutaneous tissue.  Then a 22-gauge 3.5 spinal needle was inserted targeting the junction of the left S1 superior articular process and sacral ala junction. Needle was advanced under fluoroscopic guidance.  Bone contact was made.  Omnipaque 180 was injected x 0.5 mL demonstrating no intravascular uptake.  Then a solution containing one mL of 4 mg per mL dexamethasone and 3 mL of 2% MPF lidocaine was injected x 0.5 mL.  Then the left L5 superior articular process in transverse process junction was targeted.  Bone contact was made.  Omnipaque 180 was injected x 0.5 mL demonstrating no intravascular uptake. Then a solution containing one mL of 4 mg per mL dexamethasone and 3 mL of 2% MPF lidocaine was injected x 0.5 mL.  Then the left L4 superior articular process in transverse process junction was targeted.  Bone contact was made.  Omnipaque 180 was injected x 0.5 mL demonstrating no intravascular uptake.  Then a solution containing one mL of 4 mg per mL dexamethasone and 3 mL if 2% MPF lidocaine was injected x 0.5 mL.  This same procedure was performed on the right side using the same needle, technique and injectate.  Patient tolerated procedure well.  Post procedure instructions were given.

## 2012-07-26 NOTE — Progress Notes (Signed)
  PROCEDURE RECORD [facility]   Name: [name] [name] DOB:05-23-1960 MRN: [id number]  Date:07/26/2012  Physician: Claudette Laws, MD    Nurse/CMA: Adron Geisel RN  Allergies:  Allergies  Allergen Reactions  . Acetaminophen Other (See Comments)    Inflamed liver, hospitalized REACTION: Had liver problems, was hospitalized  . Dyazide (Hydrochlorothiazide W-Triamterene) Hives  . Lisinopril     Cough   . Triamterene   . Morphine Rash    REACTION: rash    Consent Signed: yes  Is patient diabetic? no  CBG today?   Pregnant: no LMP: No LMP recorded. Patient is postmenopausal. (age 29-55)  Anticoagulants: no Anti-inflammatory: no Antibiotics: no  Procedure: bilateral medial branch block L 3-4-5 Position: Prone Start Time: 2:06  End Time: 2:15 Fluoro Time: 29 seconds  RN/CMA Designer, multimedia    Time 13:52 2:20    BP 153/84 144/83    Pulse 97 95    Respirations 14 14    O2 Sat 99 96    S/S 6 6    Pain Level 8 8     D/C home with Jamesetta So, [name] & O X 3, D/C instructions reviewed, and sits independently.

## 2012-07-26 NOTE — Patient Instructions (Signed)
Keep track of your pain. We will evaluate whether this injection was helpful for you and for how long. Please use orange score sheet

## 2012-08-03 ENCOUNTER — Other Ambulatory Visit: Payer: Self-pay | Admitting: *Deleted

## 2012-08-03 MED ORDER — FLUTICASONE PROPIONATE 50 MCG/ACT NA SUSP: 2.0000 | NASAL | Status: DC | PRN

## 2012-08-14 ENCOUNTER — Other Ambulatory Visit (INDEPENDENT_AMBULATORY_CARE_PROVIDER_SITE_OTHER): Payer: Medicaid Other

## 2012-08-14 ENCOUNTER — Other Ambulatory Visit: Payer: Self-pay | Admitting: Infectious Diseases

## 2012-08-14 ENCOUNTER — Encounter: Payer: Self-pay | Admitting: Internal Medicine

## 2012-08-14 DIAGNOSIS — Z113 Encounter for screening for infections with a predominantly sexual mode of transmission: Secondary | ICD-10-CM

## 2012-08-14 DIAGNOSIS — Z79899 Other long term (current) drug therapy: Secondary | ICD-10-CM

## 2012-08-14 DIAGNOSIS — B20 Human immunodeficiency virus [HIV] disease: Secondary | ICD-10-CM

## 2012-08-14 LAB — LIPID PANEL
Cholesterol: 192 mg/dL (ref 0–200)
HDL: 68 mg/dL (ref >39)
LDL Cholesterol: 112 mg/dL — ABNORMAL HIGH (ref 0–99)
Total CHOL/HDL Ratio: 2.8 Ratio
Triglycerides: 61 mg/dL (ref <150)
VLDL: 12 mg/dL (ref 0–40)

## 2012-08-14 LAB — CBC
MCV: 79.4 fL (ref 78.0–100.0)
Platelets: 253 10*3/uL (ref 150–400)
RDW: 17 % — ABNORMAL HIGH (ref 11.5–15.5)
WBC: 9.5 10*3/uL (ref 4.0–10.5)

## 2012-08-14 LAB — COMPLETE METABOLIC PANEL WITH GFR
ALT: 25 U/L (ref 0–35)
AST: 16 U/L (ref 0–37)
Alkaline Phosphatase: 123 U/L — ABNORMAL HIGH (ref 39–117)
BUN: 12 mg/dL (ref 6–23)
Creat: 0.69 mg/dL (ref 0.50–1.10)
Total Bilirubin: 0.4 mg/dL (ref 0.3–1.2)

## 2012-08-15 LAB — T-HELPER CELL (CD4) - (RCID CLINIC ONLY): CD4 % Helper T Cell: 20 % — ABNORMAL LOW (ref 33–55)

## 2012-08-17 ENCOUNTER — Ambulatory Visit: Payer: Medicaid Other | Admitting: Physical Medicine & Rehabilitation

## 2012-08-27 ENCOUNTER — Encounter: Payer: Medicaid Other | Attending: Physical Medicine & Rehabilitation

## 2012-08-27 ENCOUNTER — Encounter: Payer: Self-pay | Admitting: Physical Medicine & Rehabilitation

## 2012-08-27 ENCOUNTER — Ambulatory Visit (HOSPITAL_BASED_OUTPATIENT_CLINIC_OR_DEPARTMENT_OTHER): Payer: Medicaid Other | Admitting: Physical Medicine & Rehabilitation

## 2012-08-27 VITALS — BP 138/83 | HR 84 | Resp 14 | Ht 61.0 in | Wt 152.2 lb

## 2012-08-27 DIAGNOSIS — M545 Low back pain, unspecified: Secondary | ICD-10-CM | POA: Insufficient documentation

## 2012-08-27 DIAGNOSIS — M47817 Spondylosis without myelopathy or radiculopathy, lumbosacral region: Secondary | ICD-10-CM

## 2012-08-27 DIAGNOSIS — G8929 Other chronic pain: Secondary | ICD-10-CM | POA: Insufficient documentation

## 2012-08-27 DIAGNOSIS — M6281 Muscle weakness (generalized): Secondary | ICD-10-CM | POA: Insufficient documentation

## 2012-08-27 NOTE — Patient Instructions (Addendum)
L3-4-5 Medial Branch blocks These injections are helpful for lumbar arthritis

## 2012-08-27 NOTE — Progress Notes (Signed)
Subjective:    [name], female    DOB: 14-Nov-1960, 52 y.o.   MRN: [id number]  HPI Excellent relief after lumbar medial branch blocks On 07/26/2012 Improved activity Pain scores noted below Preinjection pain levels around 7/10  Pain Inventory Average Pain 2 Pain Right Now 0 My pain is "not bad"  In the last 24 hours, has pain interfered with the following? General activity 1 Relation with others 1 Enjoyment of life 1 What TIME of day is your pain at its worst? morning Sleep (in general) Good  Pain is worse with: inactivity Pain improves with: rest and medication Relief from Meds: 8  Mobility how many minutes can you walk? 30+ ability to climb steps?  yes do you drive?  no  Function disabled: date disabled .  Neuro/Psych weakness  Prior Studies Any changes since last visit?  no  Physicians involved in your care Any changes since last visit?  no   Family History  Problem Relation Age of Onset  . Heart disease Mother   . Diabetes Mother   . Stroke Mother   . Heart disease Father   . Stroke Father   . Diabetes Father   . Hepatitis Sister     hcv  . Stroke Other    History   Social History  . Marital Status: Single    Spouse Name: N/A    Number of Children: 0  . Years of Education: N/A   Social History Main Topics  . Smoking status: Former Smoker    Types: Cigarettes    Quit date: 04/26/2007  . Smokeless tobacco: Never Used     Comment: QUIT 2009  . Alcohol Use: No  . Drug Use: No  . Sexually Active: Yes -- Female partner(s)     Comment: pt. given condoms   Other Topics Concern  . None   Social History Narrative   Alternate # [phone]   Past Surgical History  Procedure Laterality Date  . Colectomy      2003 for diverticulitis  . Hand surgery    . Bunionectomy      b/l  . Shoulder surgery      left  . Tonsillectomy    . Nasal sinus surgery     Past Medical History  Diagnosis Date  . HIV infection     1994  .  Hepatitis C     1b; 02/2012 viral quant [id number]  . Hypertension   . GERD (gastroesophageal reflux disease)   . Allergy   . Depression   . CHF (congestive heart failure)   . Diverticulitis   . Arthritis   . Chronic back pain   . Hyperlipidemia   . Muscle cramps at night   . History of shingles    BP 138/83  Pulse 84  Resp 14  Ht 5\' 1"  (1.549 m)  Wt 152 lb 3.2 oz (69.037 kg)  BMI 28.77 kg/m2  SpO2 100%     Review of Systems  Constitutional: Positive for diaphoresis.  All other systems reviewed and are negative.       Objective:   Physical Exam  No tenderness to palpation lumbar paraspinal muscles Normal range of motion without pain      Assessment & Plan:  1. Lumbar spondylosis, pain improved after bilateral L3-L4-L5 lumbar medial branch blocks. As I discussed with the patient I cannot predict how long these injections will help her. It has been one month thus far. If her pain starts  to recur we can reinject her. If we get less than 3 months relief from this injection she may be a good candidate for radiofrequency ablation.

## 2012-08-29 ENCOUNTER — Encounter: Payer: Self-pay | Admitting: Infectious Diseases

## 2012-08-29 ENCOUNTER — Ambulatory Visit (INDEPENDENT_AMBULATORY_CARE_PROVIDER_SITE_OTHER): Payer: Medicaid Other | Admitting: Infectious Diseases

## 2012-08-29 VITALS — BP 152/79 | HR 96 | Temp 97.9°F | Ht 61.0 in | Wt 151.0 lb

## 2012-08-29 DIAGNOSIS — B1710 Acute hepatitis C without hepatic coma: Secondary | ICD-10-CM

## 2012-08-29 DIAGNOSIS — B20 Human immunodeficiency virus [HIV] disease: Secondary | ICD-10-CM

## 2012-08-29 DIAGNOSIS — Z21 Asymptomatic human immunodeficiency virus [HIV] infection status: Secondary | ICD-10-CM

## 2012-08-29 DIAGNOSIS — I10 Essential (primary) hypertension: Secondary | ICD-10-CM

## 2012-08-29 MED ORDER — RITONAVIR 100 MG PO TABS: 100.0000 mg | ORAL_TABLET | Freq: Every day | ORAL | Status: DC

## 2012-08-29 MED ORDER — EMTRICITABINE-TENOFOVIR DF 200-300 MG PO TABS: 1.0000 | ORAL_TABLET | Freq: Every day | ORAL | Status: DC

## 2012-08-29 MED ORDER — DARUNAVIR ETHANOLATE 800 MG PO TABS: 800.0000 mg | ORAL_TABLET | Freq: Every day | ORAL | Status: DC

## 2012-08-29 NOTE — Assessment & Plan Note (Signed)
Will get her back in with IM. She has LE edema, BP un-controlled.

## 2012-08-29 NOTE — Assessment & Plan Note (Signed)
She has f/u with [facility]. Greatly appreciate their partnering with [place]. Hopefully when she returns to see [place] she will be "cured"

## 2012-08-29 NOTE — Assessment & Plan Note (Signed)
She is doing well after some initial adjustment to DRV. Will see her back in 4-5 months to see how she is doing. vax up to date. She is given condoms.

## 2012-08-29 NOTE — Progress Notes (Signed)
  Subjective:    [name], female    DOB: December 08, 1960, 52 y.o.   MRN: [id number]  HPI 52 yo F with hx of Hep C and HIV+ (LXVr/TRV). She had nasal septoplasty 01-05-10. Has bulging disc but is getting conservative rx for this.  Has been feeling fatigued, fell off back porch due to frost, and got multiple bruises.  Has had f/u at [facility] for Hep C. 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 She is set to start sofosbuvir and ? June 2014.  At her previous visit was changed from LVXr--> DRVr. Felt like DRV gave her muscle/joint aches. She was seen by PMR and has been feeling better. States she has "crud" that is "probably allergy related".   HIV 1 RNA Quant (copies/mL)  Date Value  08/14/2012 <20   05/02/2012 21   11/18/2011 <20      CD4 T Cell Abs (cmm)  Date Value  08/14/2012 510   05/02/2012 710   11/18/2011 630       Review of Systems  Constitutional: Positive for fatigue. Negative for fever and chills.  Cardiovascular: Positive for leg swelling.  Gastrointestinal: Negative for diarrhea and constipation.  Genitourinary: Negative for difficulty urinating.       Objective:   Physical Exam  Constitutional: She appears well-developed and well-nourished.  HENT:  Mouth/Throat: No oropharyngeal exudate.  Eyes: EOM are normal. Pupils are equal, round, and reactive to light.  Neck: Neck supple.  Cardiovascular: Normal rate, regular rhythm and normal heart sounds.   Pulmonary/Chest: Effort normal and breath sounds normal.  Abdominal: Soft. Bowel sounds are normal. There is no tenderness. There is no rebound.  Musculoskeletal: She exhibits edema.  Lymphadenopathy:    She has no cervical adenopathy.          Assessment & Plan:

## 2012-09-03 ENCOUNTER — Encounter: Payer: Self-pay | Admitting: Internal Medicine

## 2012-09-03 ENCOUNTER — Ambulatory Visit (INDEPENDENT_AMBULATORY_CARE_PROVIDER_SITE_OTHER): Payer: Medicaid Other | Admitting: Internal Medicine

## 2012-09-03 VITALS — BP 131/82 | HR 88 | Temp 98.4°F | Ht 61.0 in | Wt 148.0 lb

## 2012-09-03 DIAGNOSIS — R600 Localized edema: Secondary | ICD-10-CM

## 2012-09-03 DIAGNOSIS — R609 Edema, unspecified: Secondary | ICD-10-CM

## 2012-09-03 DIAGNOSIS — B1710 Acute hepatitis C without hepatic coma: Secondary | ICD-10-CM

## 2012-09-03 DIAGNOSIS — I10 Essential (primary) hypertension: Secondary | ICD-10-CM

## 2012-09-03 DIAGNOSIS — B20 Human immunodeficiency virus [HIV] disease: Secondary | ICD-10-CM

## 2012-09-03 NOTE — Patient Instructions (Addendum)
General Instructions: Follow up with Shirlee Latch in 3 months  Try elevation of legs    Treatment Goals:  Goals (1 Years of Data) as of 09/03/12   None      Progress Toward Treatment Goals:  Treatment Goal 04/12/2012  Blood pressure at goal    Self Care Goals & Plans:  Self Care Goal 09/03/2012  Manage my medications take my medicines as prescribed; bring my medications to every visit; refill my medications on time  Monitor my health keep track of my blood pressure  Eat healthy foods eat more vegetables; drink diet soda or water instead of juice or soda  Be physically active take a walk every day; find an activity I enjoy  Meeting treatment goals maintain the current self-care plan       Care Management & Community Referrals:  Referral 09/03/2012  Referrals made for care management support none needed  Referrals made to community resources none        Peripheral Edema You have swelling in your legs (peripheral edema). This swelling is due to excess accumulation of salt and water in your body. Edema may be a sign of heart, kidney or liver disease, or a side effect of a medication. It may also be due to problems in the leg veins. Elevating your legs and using special support stockings may be very helpful, if the cause of the swelling is due to poor venous circulation. Avoid long periods of standing, whatever the cause. Treatment of edema depends on identifying the cause. Chips, pretzels, pickles and other salty foods should be avoided. Restricting salt in your diet is almost always needed. Water pills (diuretics) are often used to remove the excess salt and water from your body via urine. These medicines prevent the kidney from reabsorbing sodium. This increases urine flow. Diuretic treatment may also result in lowering of potassium levels in your body. Potassium supplements may be needed if you have to use diuretics daily. Daily weights can help you keep track of your progress in  clearing your edema. You should call your caregiver for follow up care as recommended. SEEK IMMEDIATE MEDICAL CARE IF:   You have increased swelling, pain, redness, or heat in your legs.  You develop shortness of breath, especially when lying down.  You develop chest or abdominal pain, weakness, or fainting.  You have a fever. Document Released: 05/19/2004 Document Revised: 07/04/2011 Document Reviewed: 04/29/2009 [facility] Patient Information 2013 [place], [place]. Hypertension As your heart beats, it forces blood through your arteries. This force is your blood pressure. If the pressure is too high, it is called hypertension (HTN) or high blood pressure. HTN is dangerous because you may have it and not know it. High blood pressure may mean that your heart has to work harder to pump blood. Your arteries may be narrow or stiff. The extra work puts you at risk for heart disease, stroke, and other problems.  Blood pressure consists of two numbers, a higher number over a lower, 110/72, for example. It is stated as "110 over 72." The ideal is below 120 for the top number (systolic) and under 80 for the bottom (diastolic). Write down your blood pressure today. You should pay close attention to your blood pressure if you have certain conditions such as:  Heart failure.  Prior heart attack.  Diabetes  Chronic kidney disease.  Prior stroke.  Multiple risk factors for heart disease. To see if you have HTN, your blood pressure should be measured while you are  seated with your arm held at the level of the heart. It should be measured at least twice. A one-time elevated blood pressure reading (especially in the Emergency Department) does not mean that you need treatment. There may be conditions in which the blood pressure is different between your right and left arms. It is important to see your caregiver soon for a recheck. Most people have essential hypertension which means that there is not a  specific cause. This type of high blood pressure may be lowered by changing lifestyle factors such as:  Stress.  Smoking.  Lack of exercise.  Excessive weight.  Drug/tobacco/alcohol use.  Eating less salt. Most people do not have symptoms from high blood pressure until it has caused damage to the body. Effective treatment can often prevent, delay or reduce that damage. TREATMENT  When a cause has been identified, treatment for high blood pressure is directed at the cause. There are a large number of medications to treat HTN. These fall into several categories, and your caregiver will help you select the medicines that are best for you. Medications may have side effects. You should review side effects with your caregiver. If your blood pressure stays high after you have made lifestyle changes or started on medicines,   Your medication(s) may need to be changed.  Other problems may need to be addressed.  Be certain you understand your prescriptions, and know how and when to take your medicine.  Be sure to follow up with your caregiver within the time frame advised (usually within two weeks) to have your blood pressure rechecked and to review your medications.  If you are taking more than one medicine to lower your blood pressure, make sure you know how and at what times they should be taken. Taking two medicines at the same time can result in blood pressure that is too low. SEEK IMMEDIATE MEDICAL CARE IF:  You develop a severe headache, blurred or changing vision, or confusion.  You have unusual weakness or numbness, or a faint feeling.  You have severe chest or abdominal pain, vomiting, or breathing problems. MAKE SURE YOU:   Understand these instructions.  Will watch your condition.  Will get help right away if you are not doing well or get worse. Document Released: 04/11/2005 Document Revised: 07/04/2011 Document Reviewed: 11/30/2007 [facility] Patient Information 2013  [place], [place].

## 2012-09-05 ENCOUNTER — Encounter: Payer: Self-pay | Admitting: Internal Medicine

## 2012-09-05 DIAGNOSIS — R600 Localized edema: Secondary | ICD-10-CM | POA: Insufficient documentation

## 2012-09-05 MED ORDER — CETIRIZINE HCL 10 MG PO TABS: 10.0000 mg | ORAL_TABLET | Freq: Every day | ORAL | Status: DC | PRN

## 2012-09-05 MED ORDER — MONTELUKAST SODIUM 10 MG PO TABS: 10.0000 mg | ORAL_TABLET | Freq: Every day | ORAL | Status: DC

## 2012-09-05 NOTE — Assessment & Plan Note (Signed)
Following at [facility] Will start treatment in 09/2012

## 2012-09-05 NOTE — Assessment & Plan Note (Signed)
1+ on exam, improved  Will try elevation and stockings

## 2012-09-05 NOTE — Progress Notes (Signed)
  Subjective:    [name], female    DOB: 17-Jan-1961, 52 y.o.   MRN: [id number]  HPI Comments: 52 y.o PMH HIV, HTN (131/82), Hepatitis C (following at [facility] stage 2 liver disease).  Pt seen and evaluated 5/12.  She was sent by Dr. Ninetta Lights due to lower extremity edema which she says was worse prior to coming to this appointment.  She tried leg elevation and has some compression stockings at home.   Her HIV medications were changed to Prezista which initially caused her to have muscle weakness, leg cramps and trouble walking but she is tolerating the medication better now.  She saw Dr. Alvester Morin for a bulging disc in her back at [facility] pain clinic and he gave her 6 lumbar shots and her back pain is improved.       Review of Systems  Respiratory: Positive for shortness of breath.        Sob with exertion (patient unsure if new or old)  Cardiovascular: Positive for leg swelling. Negative for chest pain.  Musculoskeletal: Negative for back pain.  Allergic/Immunologic: Positive for environmental allergies.       Objective:   Physical Exam  Nursing note and vitals reviewed. Constitutional: She is oriented to person, place, and time. Vital signs are normal. She appears well-developed and well-nourished. She is cooperative. No distress.  HENT:  Head: Normocephalic and atraumatic.  Mouth/Throat: Oropharynx is clear and moist and mucous membranes are normal. No oropharyngeal exudate.  Eyes: Conjunctivae are normal. Pupils are equal, round, and reactive to light. Right eye exhibits no discharge. Left eye exhibits no discharge. No scleral icterus.  Cardiovascular: Normal rate, regular rhythm, S1 normal, S2 normal and normal heart sounds.   No murmur heard. 1+ lower extremity edema   Pulmonary/Chest: Effort normal and breath sounds normal. No respiratory distress. She has no wheezes.  Abdominal: Soft. Bowel sounds are normal. There is no tenderness.  Neurological: She is alert and  oriented to person, place, and time. Gait normal.  Skin: Skin is warm, dry and intact. No rash noted. She is not diaphoretic.  Psychiatric: She has a normal mood and affect. Her speech is normal and behavior is normal. Judgment and thought content normal. Cognition and memory are normal.          Assessment & Plan:  F/u in 3 months

## 2012-09-05 NOTE — Assessment & Plan Note (Signed)
BP Readings from Last 3 Encounters:  09/03/12 131/82  08/29/12 152/79  08/27/12 138/83    Lab Results  Component Value Date   NA 139 08/14/2012   K 4.1 08/14/2012   CREATININE 0.69 08/14/2012    Assessment: Blood pressure control: controlled Progress toward BP goal:    Comments: none   Plan: Medications:  continue current medications Educational resources provided: handout Self management tools provided: home blood pressure logbook Other plans: none

## 2012-09-05 NOTE — Assessment & Plan Note (Signed)
Now on Prezista, Norvir, and Truvada tolerating CD4 510 with undetectable viral load

## 2012-09-05 NOTE — Progress Notes (Signed)
Case discussed with Dr. Shirlee Latch immediately after the resident saw the patient.  We reviewed the resident's history and exam and pertinent patient test results.  I agree with the assessment, diagnosis and plan of care documented in the resident's note.

## 2012-10-15 ENCOUNTER — Other Ambulatory Visit: Payer: Self-pay

## 2012-10-15 DIAGNOSIS — Z1231 Encounter for screening mammogram for malignant neoplasm of breast: Secondary | ICD-10-CM

## 2012-10-24 ENCOUNTER — Other Ambulatory Visit: Payer: Self-pay | Admitting: Internal Medicine

## 2012-10-25 NOTE — Telephone Encounter (Signed)
i attempted to call this into pharm but was told that gladys called it in today, i see no note to that effect

## 2012-10-29 ENCOUNTER — Ambulatory Visit
Admission: RE | Admit: 2012-10-29 | Discharge: 2012-10-29 | Disposition: A | Discharge status: Home / Self Care | Payer: Medicaid Other | Source: Ambulatory Visit

## 2012-10-29 ENCOUNTER — Ambulatory Visit: Payer: Medicaid Other

## 2012-10-29 DIAGNOSIS — Z1231 Encounter for screening mammogram for malignant neoplasm of breast: Secondary | ICD-10-CM

## 2012-10-30 ENCOUNTER — Other Ambulatory Visit: Payer: Self-pay | Admitting: Internal Medicine

## 2012-10-30 ENCOUNTER — Telehealth: Payer: Self-pay | Admitting: *Deleted

## 2012-10-30 MED ORDER — ONDANSETRON 4 MG PO TBDP: 4.0000 mg | ORAL_TABLET | Freq: Three times a day (TID) | ORAL | Status: DC | PRN

## 2012-10-30 NOTE — Telephone Encounter (Signed)
Pt called - changed HIV meds recently - having nausea past 3 weeks. Would like something for nausea by mouth. Still on Protonix. [facility] Kidney Cherice Glennie RN 10/30/12 11AM

## 2012-11-01 ENCOUNTER — Other Ambulatory Visit: Payer: Self-pay | Admitting: Internal Medicine

## 2012-11-29 ENCOUNTER — Other Ambulatory Visit: Payer: Self-pay | Admitting: Internal Medicine

## 2012-11-30 NOTE — Telephone Encounter (Signed)
Rx called in 

## 2012-12-06 ENCOUNTER — Ambulatory Visit (INDEPENDENT_AMBULATORY_CARE_PROVIDER_SITE_OTHER): Payer: Medicaid Other | Admitting: Internal Medicine

## 2012-12-06 ENCOUNTER — Encounter: Payer: Self-pay | Admitting: Internal Medicine

## 2012-12-06 VITALS — BP 140/91 | HR 87 | Temp 97.2°F | Ht 61.0 in | Wt 149.8 lb

## 2012-12-06 DIAGNOSIS — Z0000 Encounter for general adult medical examination without abnormal findings: Secondary | ICD-10-CM

## 2012-12-06 DIAGNOSIS — I10 Essential (primary) hypertension: Secondary | ICD-10-CM

## 2012-12-06 DIAGNOSIS — R609 Edema, unspecified: Secondary | ICD-10-CM

## 2012-12-06 DIAGNOSIS — B1710 Acute hepatitis C without hepatic coma: Secondary | ICD-10-CM

## 2012-12-06 DIAGNOSIS — B20 Human immunodeficiency virus [HIV] disease: Secondary | ICD-10-CM

## 2012-12-06 DIAGNOSIS — R600 Localized edema: Secondary | ICD-10-CM

## 2012-12-06 NOTE — Assessment & Plan Note (Signed)
BP Readings from Last 3 Encounters:  12/06/12 140/91  09/03/12 131/82  08/29/12 152/79    Lab Results  Component Value Date   NA 139 08/14/2012   K 4.1 08/14/2012   CREATININE 0.69 08/14/2012    Assessment: Blood pressure control: controlled Progress toward BP goal:  at goal Comments: none  Plan: Medications:  continue current medications Educational resources provided: brochure Self management tools provided: home blood pressure logbook Other plans: none f/u in 3 months

## 2012-12-06 NOTE — Assessment & Plan Note (Signed)
Follows at [facility] Dr. Wyvonnia Lora who saw the patint 12/03/12 and will tx for Hep C with Simeprevir and Sofosbuvir when HIV medications changed.

## 2012-12-06 NOTE — Assessment & Plan Note (Signed)
Called Dr. Nita Sells office she is not due to colonoscopy until 2017

## 2012-12-06 NOTE — Addendum Note (Signed)
Addended by: Annett Gula on: 12/06/2012 05:28 PM   Modules accepted: Orders

## 2012-12-06 NOTE — Assessment & Plan Note (Addendum)
Spoke with Dr. Ninetta Lights about medication change in HIV meds per hepatitis specialist at [facility] Dr. Wyvonnia Lora rec switching from Ritonavir booster Darunavir to Raltegravir and keep Truvada Dr. Ninetta Lights aware and will call the patient regarding and change medications w/in the next week  She has f/u 12/2012 with ID clinic Patient was getting intermittently nauseated from HIV meds but not nauseated today prn Zofran ODT given

## 2012-12-06 NOTE — Assessment & Plan Note (Signed)
Improved with TED hose use

## 2012-12-06 NOTE — Progress Notes (Signed)
  Subjective:    [name], female    DOB: July 14, 1960, 52 y.o.   MRN: [id number]  HPI Comments: 52 y.o with well controlled HIV (followed by Dr. 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  She presents for follow up.  She saw [facility] ID 12/03/12 for hepatitis C follow up.  They are interested in treating her hepatitis C with Simprevir and Sofosbuvir but first she needs a change in her HIV medications.  She has a note written here from Dr. Wyvonnia Lora at [facility] that says switch Ritonavir booster Darunavir to Raltegravir and keep Truvada.  Once her HIV medications are changed she can pursue HCV treatment.  She was recently given Twinrix shot as well.    She is not nauseated due to HIV meds today.  When she is ODT Zofran helps  Her lower extremity edema is better today and she is wearing compression stockings   Health maintenance: She had colonoscopy with Dr. Arlyce Dice and is not due for colonoscopy currently until 2017  ROS per HPI     Review of Systems  Respiratory: Negative for shortness of breath.   Cardiovascular: Positive for leg swelling. Negative for chest pain.       Improved lower extremity edema  Gastrointestinal: Negative for nausea.       Objective:   Physical Exam  Nursing note and vitals reviewed. Constitutional: She is oriented to person, place, and time. Vital signs are normal. She appears well-developed and well-nourished. She is cooperative. No distress.  HENT:  Head: Normocephalic and atraumatic.  Mouth/Throat: Oropharynx is clear and moist and mucous membranes are normal. No oropharyngeal exudate.  Eyes: Conjunctivae are normal. Right eye exhibits no discharge. Left eye exhibits no discharge. No scleral icterus.  Cardiovascular: Normal rate, regular  rhythm, S1 normal, S2 normal and normal heart sounds.   No murmur heard. 1+ lower extremity edema   Pulmonary/Chest: Effort normal and breath sounds normal. No respiratory distress. She has no wheezes.  Abdominal: Soft. Bowel sounds are normal. There is no tenderness.  Obese ab  Neurological: She is alert and oriented to person, place, and time.  Grossly neurologically intact  Skin: Skin is warm, dry and intact. No rash noted. She is not diaphoretic.  Psychiatric: She has a normal mood and affect. Her speech is normal and behavior is normal. Judgment and thought content normal. Cognition and memory are normal.          Assessment & Plan:  Follow up in 3-4 months, sooner if needed

## 2012-12-06 NOTE — Patient Instructions (Addendum)
Follow up in 3 months, sooner if needed  I will call Dr. Ninetta Lights about your medications   Nausea, Adult Nausea is the feeling that you have an upset stomach or have to vomit. Nausea by itself is not likely a serious concern, but it may be an early sign of more serious medical problems. As nausea gets worse, it can lead to vomiting. If vomiting develops, there is the risk of dehydration.  CAUSES   Viral infections.  Food poisoning.  Medicines.  Pregnancy.  Motion sickness.  Migraine headaches.  Emotional distress.  Severe pain from any source.  Alcohol intoxication. HOME CARE INSTRUCTIONS  Get plenty of rest.  Ask your caregiver about specific rehydration instructions.  Eat small amounts of food and sip liquids more often.  Take all medicines as told by your caregiver. SEEK MEDICAL CARE IF:  You have not improved after 2 days, or you get worse.  You have a headache. SEEK IMMEDIATE MEDICAL CARE IF:   You have a fever.  You faint.  You keep vomiting or have blood in your vomit.  You are extremely weak or dehydrated.  You have dark or bloody stools.  You have severe chest or abdominal pain. MAKE SURE YOU:  Understand these instructions.  Will watch your condition.  Will get help right away if you are not doing well or get worse. Document Released: 05/19/2004 Document Revised: 01/04/2012 Document Reviewed: 12/22/2010 [facility] Patient Information 2014 [place], [place].

## 2012-12-07 NOTE — Progress Notes (Signed)
Case discussed with Dr. McLean at the time of the visit.  We reviewed the resident's history and exam and pertinent patient test results.  I agree with the assessment, diagnosis, and plan of care documented in the resident's note.     

## 2012-12-10 ENCOUNTER — Other Ambulatory Visit: Payer: Self-pay | Admitting: Licensed Clinical Social Worker

## 2012-12-10 ENCOUNTER — Other Ambulatory Visit: Payer: Self-pay | Admitting: Infectious Diseases

## 2012-12-10 DIAGNOSIS — B20 Human immunodeficiency virus [HIV] disease: Secondary | ICD-10-CM

## 2012-12-10 MED ORDER — DOLUTEGRAVIR SODIUM 50 MG PO TABS: 50.0000 mg | ORAL_TABLET | Freq: Every day | ORAL | Status: DC

## 2012-12-17 ENCOUNTER — Telehealth: Payer: Self-pay

## 2012-12-17 NOTE — Telephone Encounter (Signed)
Patient called complaining of increased back pain.  She thinks its from a new HIV medication but she is not sure.  Last note shows she would benefit from further injections.  Follow up appointment made to see [name] [name].

## 2012-12-19 ENCOUNTER — Encounter: Payer: Medicaid Other | Admitting: Physical Medicine and Rehabilitation

## 2012-12-20 ENCOUNTER — Encounter: Payer: Medicaid Other | Attending: Physical Medicine & Rehabilitation

## 2012-12-20 ENCOUNTER — Encounter: Payer: Self-pay | Admitting: Physical Medicine & Rehabilitation

## 2012-12-20 ENCOUNTER — Ambulatory Visit (HOSPITAL_BASED_OUTPATIENT_CLINIC_OR_DEPARTMENT_OTHER): Payer: Medicaid Other | Admitting: Physical Medicine & Rehabilitation

## 2012-12-20 VITALS — BP 147/85 | HR 93 | Resp 16 | Ht 61.0 in | Wt 146.0 lb

## 2012-12-20 DIAGNOSIS — M545 Low back pain, unspecified: Secondary | ICD-10-CM | POA: Insufficient documentation

## 2012-12-20 DIAGNOSIS — M47817 Spondylosis without myelopathy or radiculopathy, lumbosacral region: Secondary | ICD-10-CM

## 2012-12-20 NOTE — Patient Instructions (Signed)

## 2012-12-20 NOTE — Progress Notes (Signed)
  PROCEDURE RECORD [facility]   Name: [name] [name] DOB:15-Jan-1961 MRN: [id number]  Date:12/20/2012  Physician: Claudette Laws, MD    Nurse/CMA: Destenee Guerry,CMA/Shumaker, RN  Allergies:  Allergies  Allergen Reactions  . Acetaminophen Other (See Comments)    Inflamed liver, hospitalized REACTION: Had liver problems, was hospitalized  . Dyazide [Hydrochlorothiazide W-Triamterene] Hives  . Lisinopril     Cough   . Triamterene   . Morphine Rash    REACTION: rash    Consent Signed: yes  Is patient diabetic? no    Pregnant: no LMP: No LMP recorded. Patient is postmenopausal. (age 48-55)  Anticoagulants: no Anti-inflammatory: no Antibiotics: no  Procedure:bilateral L 3-4-5  Medial Branch Block  Position: Prone Start Time: 12:24  End Time: 12:33  Fluoro Time: 32 seconds  RN/CMA Kyanne Rials,CMA Shumaker,RN    Time 1150 12:37    BP 147/85 154/81    Pulse 93 89    Respirations 16 16    O2 Sat 98 98    S/S 6 6    Pain Level 9/10 2/10     D/C home with sister in law-[name], [name] & O X 3, D/C instructions reviewed, and sits independently.

## 2012-12-20 NOTE — Progress Notes (Signed)
Bilateral Lumbar L3, L4  medial branch blocks and L 5 dorsal ramus injection under fluoroscopic guidance  Indication: Lumbar pain which is not relieved by medication management or other conservative care and interfering with self-care and mobility. Prior procedure performed in April 2014 lasted 3-4 months. Pain has now occurred.  Last CD 4 count 510  Informed consent was obtained after describing risks and benefits of the procedure with the patient, this includes bleeding, infection, paralysis and medication side effects.  The patient wishes to proceed and has given written consent. The patient has well-controlled HIV. Reviewed most recent lab work. The patient was placed in prone position.  The lumbar area was marked and prepped with Betadine.  One mL of 1% lidocaine was injected into each of 6 areas into the skin and subcutaneous tissue.  Then a 22-gauge 3.5 spinal needle was inserted targeting the junction of the left S1 superior articular process and sacral ala junction. Needle was advanced under fluoroscopic guidance.  Bone contact was made.  Omnipaque 180 was injected x 0.5 mL demonstrating no intravascular uptake.  Then a solution containing one mL of 4 mg per mL dexamethasone and 3 mL of 2% MPF lidocaine was injected x 0.5 mL.  Then the left L5 superior articular process in transverse process junction was targeted.  Bone contact was made.  Omnipaque 180 was injected x 0.5 mL demonstrating no intravascular uptake. Then a solution containing one mL of 4 mg per mL dexamethasone and 3 mL of 2% MPF lidocaine was injected x 0.5 mL.  Then the left L4 superior articular process in transverse process junction was targeted.  Bone contact was made.  Omnipaque 180 was injected x 0.5 mL demonstrating no intravascular uptake.  Then a solution containing one mL of 4 mg per mL dexamethasone and 3 mL if 2% MPF lidocaine was injected x 0.5 mL.  This same procedure was performed on the right side using the same needle,  technique and injectate.  Patient tolerated procedure well.  Post procedure instructions were given.

## 2012-12-26 ENCOUNTER — Other Ambulatory Visit: Payer: Medicaid Other

## 2012-12-26 DIAGNOSIS — B20 Human immunodeficiency virus [HIV] disease: Secondary | ICD-10-CM

## 2012-12-26 LAB — COMPLETE METABOLIC PANEL WITH GFR
ALT: 26 U/L (ref 0–35)
AST: 24 U/L (ref 0–37)
Albumin: 4.3 g/dL (ref 3.5–5.2)
Alkaline Phosphatase: 101 U/L (ref 39–117)
BUN: 12 mg/dL (ref 6–23)
Calcium: 9.3 mg/dL (ref 8.4–10.5)
Chloride: 104 mEq/L (ref 96–112)
Potassium: 4.1 mEq/L (ref 3.5–5.3)
Sodium: 139 mEq/L (ref 135–145)
Total Protein: 7.2 g/dL (ref 6.0–8.3)

## 2012-12-26 LAB — CBC
HCT: 42 % (ref 36.0–46.0)
MCHC: 33.1 g/dL (ref 30.0–36.0)
RDW: 17.4 % — ABNORMAL HIGH (ref 11.5–15.5)
WBC: 6.2 10*3/uL (ref 4.0–10.5)

## 2012-12-27 ENCOUNTER — Other Ambulatory Visit: Payer: Self-pay | Admitting: Internal Medicine

## 2012-12-28 LAB — HIV-1 RNA QUANT-NO REFLEX-BLD
HIV 1 RNA Quant: <20 copies/mL (ref <20)
HIV-1 RNA Quant, Log: <1.3 {Log} (ref <1.30)

## 2013-01-07 ENCOUNTER — Encounter: Payer: Self-pay | Admitting: Internal Medicine

## 2013-01-07 ENCOUNTER — Telehealth: Payer: Self-pay

## 2013-01-07 ENCOUNTER — Ambulatory Visit (INDEPENDENT_AMBULATORY_CARE_PROVIDER_SITE_OTHER): Payer: Medicaid Other | Admitting: Internal Medicine

## 2013-01-07 VITALS — BP 160/92 | HR 97 | Temp 98.0°F | Ht 61.0 in | Wt 151.0 lb

## 2013-01-07 DIAGNOSIS — Z23 Encounter for immunization: Secondary | ICD-10-CM

## 2013-01-07 NOTE — Progress Notes (Addendum)
[facility] CLIN[facility] NOTE  RFV: rash  Subjective:    [name], female    DOB: 06/18/1960, 52 y.o.   MRN: [id number]  HPI 52yo F with HIV-HCV co-infection, CD 4 count of 730/VL<20 (sep 2014), on tivicay/truvada and anticipated to start sofosbuvir/simeprevir to HCV treatment tomorrow. The patient started noticing having itching and rash, which is new for her. Rash is localized to chest , lower abdominal wall and buttocks. She feels her skin feels like sandpaper. She started taking tivicay end of august adn is concerned that her rash is due to new medication. She does report that she remembers the pruritis preceeding the rash. She started to having "itching" symptoms prior to tivicay, that she did receive hydroxyzine which improved her symptoms somewhat, since she stopped taking the medication her symptoms have become severe with rash now for roughly 5-7 days.  She reports that she was itching prior to the onset of rash initially given atarax 10mg   which helped tremendously for 10 days but now off for the past 2 wks.  Sees dr. Wyvonnia Lora at [facility] for HCV management [phone],   She states that she had lumbar injections 2-3 wks ago, that improved her back pain.   Current Outpatient Prescriptions on File Prior to Visit  Medication Sig Dispense Refill  . amitriptyline (ELAVIL) 25 MG tablet Take 1 tablet (25 mg total) by mouth at bedtime.  30 tablet  1  . amLODipine (NORVASC) 10 MG tablet Take 1 tablet (10 mg total) by mouth daily.  30 tablet  11  . cetirizine (ZYRTEC) 10 MG tablet Take 1 tablet (10 mg total) by mouth daily as needed. For allergies  90 tablet  3  . diazepam (VALIUM) 5 MG tablet TAKE 1 TABLET BY MOUTH AT BEDTIME AS NEEDED  30 tablet  1  . dolutegravir (TIVICAY) 50 MG tablet Take 1 tablet (50 mg total) by mouth daily.  90 tablet  3  . emtricitabine-tenofovir (TRUVADA) 200-300 MG per tablet Take 1 tablet by mouth daily.  30 tablet  6  . fluticasone (FLONASE) 50 MCG/ACT nasal  spray INSERT 2 SPRAYS IN EACH NOSTRIL DAILY AS NEEDED  16 g  2  . montelukast (SINGULAIR) 10 MG tablet Take 1 tablet (10 mg total) by mouth daily.  90 tablet  3  . naproxen (NAPROSYN) 500 MG tablet TAKE 1 TABLET BY MOUTH TWICE DAILY BETWEEN MEALS AS NEEDED FOR BACK PAIN  60 tablet  PRN  . Olopatadine HCl (PATADAY) 0.2 % SOLN Apply 1 drop to eye daily as needed. For allergies       . ondansetron (ZOFRAN-ODT) 4 MG disintegrating tablet TAKE 1 TABLET BY MOUTH EVERY 8 HOURS AS NEEDED FOR NAUSEA.  30 tablet  1  . pantoprazole (PROTONIX) 20 MG tablet Take 1 tablet (20 mg total) by mouth 2 (two) times daily.  180 tablet  3  . potassium chloride 40 MEQ/15ML (20%) LIQD TAKE 15 ML BY MOUTH EVERY MORNING  473 mL  2  . pravastatin (PRAVACHOL) 40 MG tablet TAKE 1 TABLET BY MOUTH EVERY DAY  30 tablet  PRN  . PROAIR HFA 108 (90 BASE) MCG/ACT inhaler INHALE 1-2 PUFFS BY MOUTH INTO THE LUNGS EVERY 6 HOURS AS NEEDED  1 Inhaler  2  . traMADol (ULTRAM) 50 MG tablet TAKE 2 TABLETS BY MOUTH EVERY 4-6 HOURS AS NEEDED FOR PAIN  90 tablet  0  . Simeprevir Sodium 150 MG CAPS 150 mg. Take 150 mg by mouth  daily for 28 days.      . Sofosbuvir 400 MG TABS 400 mg. Take 400 mg by mouth daily for 28 days.      . [DISCONTINUED] hydrochlorothiazide (HYDRODIURIL) 25 MG tablet Take 25 mg by mouth daily.       No current facility-administered medications on file prior to visit.   Active Ambulatory Problems    Diagnosis Date Noted  . HIV DISEASE 05/09/2006  . HEPATITIS C 05/09/2006  . HYPERLIPIDEMIA 10/23/2008  . Depressive disorder, not elsewhere classified 05/09/2006  . Allergic rhinitis, cause unspecified 05/09/2006  . GERD 05/09/2006  . SYMPTOMATIC MENOPAUSAL/FEMALE CLIMACTERIC STATES 08/26/2009  . BACK PAIN 05/22/2009  . INSOMNIA 02/14/2007  . CHEST PAIN, ATYPICAL 03/08/2010  . Venous insufficiency 07/01/2010  . LBP (low back pain) 10/05/2010  . HTN (hypertension) 10/05/2010  . Fatigue 05/18/2011  . Muscle cramp,  nocturnal 07/08/2011  . 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 Postherpetic neuralgia 12/15/2011  . Health care maintenance 12/15/2011  . Lower extremity edema 09/05/2012  . Lumbosacral spondylosis without myelopathy 12/20/2012   Resolved Ambulatory Problems    Diagnosis Date Noted  . UPPER RESPIRATORY INFECTION 08/20/2007  . Post herpetic neuralgia 07/01/2010  . Hypokalemia 01/13/2011  . Candidiasis 01/13/2011  . Vaginal Discharge 03/31/2011  . Chest pain 05/31/2011  . Nausea 07/20/2011  . Myalgia 07/20/2011  . Sinusitis acute 01/19/2012   Past Medical History  Diagnosis Date  . HIV infection   . Hepatitis C   . Hypertension   . GERD (gastroesophageal reflux disease)   . Allergy   . Depression   . CHF (congestive heart failure)   . Diverticulitis   . Arthritis   . Chronic back pain   . Hyperlipidemia   . Muscle cramps at night   . History of shingles       Review of Systems In adition to what is mentioned in hpi with rash and pruritis, she states she has low back pain    Objective:   Physical Exam BP 160/92  Pulse 97  Temp(Src) 98 F (36.7 C) (Oral)  Ht 5\' 1"  (1.549 m)  Wt 151 lb (68.493 kg)  BMI 28.55 kg/m2 Physical Exam  Constitutional:  oriented to person, place, and time. appears well-developed and well-nourished. No distress.  HENT:  Mouth/Throat: Oropharynx is clear and moist. No oropharyngeal exudate.  Pulmonary/Chest: Effort normal and breath sounds normal. No respiratory distress. He has no wheezes.  Abdominal: Soft. Bowel sounds are normal. He exhibits no distension. There is no tenderness.  Midline surgical scar from ostomy bag, mild erythema to bilateral lower quadrants, blanching Lymphadenopathy:  no cervical adenopathy.  Skin: Skin is warm and dry. Scattered raised papules between her breast and a few lesions on abdomen. sandpaper feel to abdomen and buttocks.        Assessment & Plan:  Pruritic rash = unclear if partly  exacerbated from excoriation. Will re-prescribe atarax, patient given atarax 25mg  take 1 tab at bedtime but can do Q12hr max. . Hydrocortisone cream 1% apply twice a day. I am not completely convinced that the rash is due to tivicay since it is not involving all areas of body thus far, only scattered regions but this could be the early part of drug reaction. Pruritic component had also started prior to the initiation of tivicay  hcv = will call dr. Wyvonnia Lora to see if can hold treatment until rash resolves. I worry that the rash will progress while  initiating new medication and that maybe considered as SE of medication.  Health maintenance = will give flu shot today   rtc on Wednesday with [facility]cher to see if intervention has helped  -------  Spoke with Dr. Wyvonnia Lora, she is in agreement to postpone hcv treatment until rash resolves, to ensure this is not a drug rash. Patient can start back on meds when we feel she has resolution of rash or can discuss initiation at next visit with Dr. Wyvonnia Lora on Oct 13th.

## 2013-01-07 NOTE — Telephone Encounter (Signed)
Pt has c/o itching. She is not sure if it is related to new medication given by Dr Ninetta Lights since she was itching prior. She thinks this new medication has magnified the itching. There is no rash present but her skin feels leathery  She does not have any shortness of breath or throat closing.   Pt is due to start Hepatitis  C medications tomorrow.   Pt advised to come for work in viist at 1:30 pm today.   She needs evaluation by physician.   Laurell Josephs, RN

## 2013-01-09 ENCOUNTER — Encounter: Payer: Self-pay | Admitting: Infectious Diseases

## 2013-01-09 ENCOUNTER — Ambulatory Visit (INDEPENDENT_AMBULATORY_CARE_PROVIDER_SITE_OTHER): Payer: Medicaid Other | Admitting: Infectious Diseases

## 2013-01-09 VITALS — BP 152/100 | HR 111 | Temp 98.0°F | Ht 61.0 in | Wt 151.0 lb

## 2013-01-09 DIAGNOSIS — B20 Human immunodeficiency virus [HIV] disease: Secondary | ICD-10-CM

## 2013-01-09 DIAGNOSIS — B1710 Acute hepatitis C without hepatic coma: Secondary | ICD-10-CM

## 2013-01-09 MED ORDER — RALTEGRAVIR POTASSIUM 400 MG PO TABS: 400.0000 mg | ORAL_TABLET | Freq: Two times a day (BID) | ORAL | Status: DC

## 2013-01-09 NOTE — Assessment & Plan Note (Signed)
Will f/u at [facility] on 02-04-13.  Will not start therapy until she has f/u.

## 2013-01-09 NOTE — Assessment & Plan Note (Addendum)
She is doing well except for ADR. Will try changing her to ISN although I am not sure this will make a difference. She will call back in 2 weeks and if still rashing will give her steroid dose pack. rtc 1 month. Offered/provided condoms. Has gotten flu shot.

## 2013-01-09 NOTE — Progress Notes (Signed)
  Subjective:    [name], female    DOB: 1960/10/21, 52 y.o.   MRN: [id number]  HPI 52 yo F with hx of HIV+ and Hep C. She has also had prev rhinoplasty.  Since starting DTGV she has had rashes. Has been started on atarax but has had minimal help. Topical steroid has given minimal relief. Has been feeling weak and sore since on DTGV.  HIV 1 RNA Quant (copies/mL)  Date Value  12/26/2012 <20   08/14/2012 <20   05/02/2012 21      CD4 T Cell Abs (/uL)  Date Value  12/26/2012 730   08/14/2012 510   05/02/2012 710     Review of Systems  Constitutional: Negative for fever, chills, appetite change and unexpected weight change.  HENT: Negative for sore throat.   Gastrointestinal: Negative for diarrhea and constipation.  Genitourinary: Negative for difficulty urinating.  Skin: Positive for rash.       Objective:   Physical Exam  Constitutional: She appears well-developed and well-nourished.  HENT:  Mouth/Throat: No oropharyngeal exudate.  Eyes: EOM are normal. Pupils are equal, round, and reactive to light.  Cardiovascular: Normal rate, regular rhythm and normal heart sounds.   Pulmonary/Chest: Effort normal and breath sounds normal.  Abdominal: Soft. Bowel sounds are normal. There is no tenderness.  Musculoskeletal: She exhibits no edema.  Lymphadenopathy:    She has no cervical adenopathy.          Assessment & Plan:

## 2013-01-24 ENCOUNTER — Other Ambulatory Visit: Payer: Self-pay | Admitting: Internal Medicine

## 2013-01-25 ENCOUNTER — Other Ambulatory Visit: Payer: Medicaid Other

## 2013-01-28 NOTE — Telephone Encounter (Signed)
Called to pharm 

## 2013-02-13 ENCOUNTER — Encounter: Payer: Self-pay | Admitting: Infectious Diseases

## 2013-02-13 ENCOUNTER — Ambulatory Visit (INDEPENDENT_AMBULATORY_CARE_PROVIDER_SITE_OTHER): Payer: Medicaid Other | Admitting: Infectious Diseases

## 2013-02-13 VITALS — Temp 97.9°F

## 2013-02-13 DIAGNOSIS — B1710 Acute hepatitis C without hepatic coma: Secondary | ICD-10-CM

## 2013-02-13 DIAGNOSIS — B20 Human immunodeficiency virus [HIV] disease: Secondary | ICD-10-CM

## 2013-02-13 DIAGNOSIS — Z79899 Other long term (current) drug therapy: Secondary | ICD-10-CM

## 2013-02-13 DIAGNOSIS — Z113 Encounter for screening for infections with a predominantly sexual mode of transmission: Secondary | ICD-10-CM

## 2013-02-13 MED ORDER — HYDROXYZINE HCL 25 MG PO TABS: 25.0000 mg | ORAL_TABLET | Freq: Three times a day (TID) | ORAL | Status: DC | PRN

## 2013-02-13 NOTE — Assessment & Plan Note (Signed)
My great appreciation to [facility].

## 2013-02-13 NOTE — Progress Notes (Signed)
  Subjective:    [name], female    DOB: 12/10/1960, 52 y.o.   MRN: [id number]  HPI 52yo F with hx of Hep C and HIV+ (LXVr/TRV). She had nasal septoplasty 01-05-10. She was previously changed to DRVr in 2013. She was then changed to TRV/DTGV in anticipation of starting Hep C treatment. She developed a rash on this and was then changed to ISN/TRV.  She has been on Hep C therapy for 9 days now and having muscle aches, fatigue, headaches, and pruritis (worse at night). Has been taking hydroxyzine which has worked. 25mg  was effective, 10mg  was not. She has had no rashes. She associates the itching with the ISN. Is planned for 12 weeks of therapy.   HIV 1 RNA Quant (copies/mL)  Date Value  12/26/2012 <20   08/14/2012 <20   05/02/2012 21      CD4 T Cell Abs (/uL)  Date Value  12/26/2012 730   08/14/2012 510   05/02/2012 710     Review of Systems  Constitutional: Positive for fatigue.  Gastrointestinal: Positive for constipation and abdominal distention. Negative for diarrhea.  Genitourinary: Negative for difficulty urinating.       Objective:   Physical Exam  Constitutional: She appears well-developed and well-nourished.  HENT:  Mouth/Throat: No oropharyngeal exudate.  Eyes: EOM are normal. Pupils are equal, round, and reactive to light.  Neck: Neck supple.  Cardiovascular: Normal rate, regular rhythm and normal heart sounds.   Pulmonary/Chest: Effort normal and breath sounds normal.  Abdominal: Soft. Bowel sounds are normal. She exhibits distension. There is no tenderness. There is no rebound.  Lymphadenopathy:    She has no cervical adenopathy.          Assessment & Plan:

## 2013-02-13 NOTE — Assessment & Plan Note (Signed)
She is doing very well. Hopefully she can tolerate integrase inhibitor for the next 75 days to complete her Hep C therapy. She has gotten flu shot. She is given condoms. Will see her back in 3-4 months.

## 2013-02-14 ENCOUNTER — Other Ambulatory Visit: Payer: Self-pay | Admitting: *Deleted

## 2013-02-14 DIAGNOSIS — B20 Human immunodeficiency virus [HIV] disease: Secondary | ICD-10-CM

## 2013-02-14 DIAGNOSIS — B1710 Acute hepatitis C without hepatic coma: Secondary | ICD-10-CM

## 2013-02-14 MED ORDER — HYDROXYZINE HCL 25 MG PO TABS: 25.0000 mg | ORAL_TABLET | Freq: Three times a day (TID) | ORAL | Status: DC | PRN

## 2013-02-20 ENCOUNTER — Other Ambulatory Visit: Payer: Self-pay | Admitting: *Deleted

## 2013-02-20 DIAGNOSIS — B20 Human immunodeficiency virus [HIV] disease: Secondary | ICD-10-CM

## 2013-02-20 MED ORDER — EMTRICITABINE-TENOFOVIR DF 200-300 MG PO TABS: 1.0000 | ORAL_TABLET | Freq: Every day | ORAL | Status: DC

## 2013-02-20 MED ORDER — RALTEGRAVIR POTASSIUM 400 MG PO TABS: 400.0000 mg | ORAL_TABLET | Freq: Two times a day (BID) | ORAL | Status: DC

## 2013-02-21 ENCOUNTER — Other Ambulatory Visit: Payer: Self-pay | Admitting: Internal Medicine

## 2013-03-11 ENCOUNTER — Ambulatory Visit: Payer: Medicaid Other | Admitting: Physical Medicine & Rehabilitation

## 2013-03-12 ENCOUNTER — Ambulatory Visit (HOSPITAL_BASED_OUTPATIENT_CLINIC_OR_DEPARTMENT_OTHER): Payer: Medicaid Other | Admitting: Physical Medicine & Rehabilitation

## 2013-03-12 ENCOUNTER — Encounter: Payer: Self-pay | Admitting: Physical Medicine & Rehabilitation

## 2013-03-12 ENCOUNTER — Encounter: Payer: Medicaid Other | Attending: Physical Medicine & Rehabilitation

## 2013-03-12 VITALS — BP 130/77 | HR 108 | Resp 14 | Ht 61.0 in | Wt 151.8 lb

## 2013-03-12 DIAGNOSIS — M47817 Spondylosis without myelopathy or radiculopathy, lumbosacral region: Secondary | ICD-10-CM

## 2013-03-12 DIAGNOSIS — M545 Low back pain, unspecified: Secondary | ICD-10-CM | POA: Insufficient documentation

## 2013-03-12 NOTE — Progress Notes (Signed)
Bilateral Lumbar L3, L4  medial branch blocks and L 5 dorsal ramus injection under fluoroscopic guidance  Indication: Lumbar pain which is not relieved by medication management or other conservative care and interfering with self-care and mobility. Prior procedure performed in April 2014 lasted 3-4 months. Pain has now occurred.  Last CD 4 count Sept 2014 was 730 with 28% T helper cells, improved vs prior result  Informed consent was obtained after describing risks and benefits of the procedure with the patient, this includes bleeding, infection, paralysis and medication side effects.  The patient wishes to proceed and has given written consent. The patient has well-controlled HIV. Reviewed most recent lab work. The patient was placed in prone position.  The lumbar area was marked and prepped with Betadine.  One mL of 1% lidocaine was injected into each of 6 areas into the skin and subcutaneous tissue.  Then a 22-gauge 3.5 spinal needle was inserted targeting the junction of the left S1 superior articular process and sacral ala junction. Needle was advanced under fluoroscopic guidance.  Bone contact was made.  Omnipaque 180 was injected x 0.5 mL demonstrating no intravascular uptake.  Then a solution containing one mL of 4 mg per mL dexamethasone and 3 mL of 2% MPF lidocaine was injected x 0.5 mL.  Then the left L5 superior articular process in transverse process junction was targeted.  Bone contact was made.  Omnipaque 180 was injected x 0.5 mL demonstrating no intravascular uptake. Then a solution containing one mL of 4 mg per mL dexamethasone and 3 mL of 2% MPF lidocaine was injected x 0.5 mL.  Then the left L4 superior articular process in transverse process junction was targeted.  Bone contact was made.  Omnipaque 180 was injected x 0.5 mL demonstrating no intravascular uptake.  Then a solution containing one mL of 4 mg per mL dexamethasone and 3 mL if 2% MPF lidocaine was injected x 0.5 mL.  This same  procedure was performed on the right side using the same needle, technique and injectate.  Patient tolerated procedure well.  Post procedure instructions were given.  Repeat in 3months, this has been average duration of benefit for this pt 

## 2013-03-12 NOTE — Patient Instructions (Signed)

## 2013-03-12 NOTE — Progress Notes (Signed)
  PROCEDURE RECORD [facility]   Name: [name] [name] DOB:14-Sep-1960 MRN: [id number]  Date:03/12/2013  Physician: Claudette Laws, MD    Nurse/CMA: Advik Weatherspoon CMA  Allergies:  Allergies  Allergen Reactions  . Acetaminophen Other (See Comments)    Inflamed liver, hospitalized REACTION: Had liver problems, was hospitalized  . Dyazide [Hydrochlorothiazide W-Triamterene] Hives  . Lisinopril     Cough   . Triamterene   . Morphine Rash    REACTION: rash    Consent Signed: yes  Is patient diabetic? no  CBG today? no  140Pregnant: no LMP: No LMP recorded. Patient is postmenopausal. (age 57-55)  Anticoagulants: no Anti-inflammatory: no Antibiotics: no  Procedure: Bilateral Medial Branch Block Position: Prone Start Time:12:34  End Time: 12:43 Fluoro Time: 32 seconds  RN/CMA Cherl Gorney CMA Nizar Cutler CMA    Time 12:18 12:48    BP 130/77 140/81    Pulse 108 88    Respirations 14 14    O2 Sat 97 87    S/S 6 6    Pain Level 6/10 5/10     D/C home with Geronimo Boot, [name] & O X 3, D/C instructions reviewed, and sits independently.

## 2013-03-14 ENCOUNTER — Encounter: Payer: Medicaid Other | Admitting: Internal Medicine

## 2013-03-20 ENCOUNTER — Other Ambulatory Visit: Payer: Self-pay | Admitting: Internal Medicine

## 2013-04-11 ENCOUNTER — Other Ambulatory Visit: Payer: Self-pay | Admitting: Internal Medicine

## 2013-04-12 NOTE — Telephone Encounter (Signed)
rx called in

## 2013-04-17 ENCOUNTER — Other Ambulatory Visit: Payer: Self-pay | Admitting: Internal Medicine

## 2013-05-02 ENCOUNTER — Encounter: Payer: Self-pay | Admitting: Internal Medicine

## 2013-05-02 ENCOUNTER — Ambulatory Visit (INDEPENDENT_AMBULATORY_CARE_PROVIDER_SITE_OTHER): Payer: Medicaid Other | Admitting: Internal Medicine

## 2013-05-02 VITALS — BP 137/88 | HR 102 | Temp 96.6°F | Ht 61.0 in | Wt 150.6 lb

## 2013-05-02 DIAGNOSIS — B20 Human immunodeficiency virus [HIV] disease: Secondary | ICD-10-CM

## 2013-05-02 DIAGNOSIS — I10 Essential (primary) hypertension: Secondary | ICD-10-CM

## 2013-05-02 DIAGNOSIS — M549 Dorsalgia, unspecified: Secondary | ICD-10-CM

## 2013-05-02 DIAGNOSIS — B1710 Acute hepatitis C without hepatic coma: Secondary | ICD-10-CM

## 2013-05-02 NOTE — Assessment & Plan Note (Signed)
S/p lumbar block feeling better Continue Ultram prn

## 2013-05-02 NOTE — Addendum Note (Signed)
Addended by: Cresenciano Genre on: 05/02/2013 05:52 PM   Modules accepted: Level of Service

## 2013-05-02 NOTE — Progress Notes (Signed)
Case discussed with Dr. McLean soon after the resident saw the patient.  We reviewed the resident's history and exam and pertinent patient test results.  I agree with the assessment, diagnosis, and plan of care documented in the resident's note. 

## 2013-05-02 NOTE — Progress Notes (Signed)
   Subjective:    [name], female    DOB: [birth year], 53 y.o.   MRN: [id number]  HPI Comments: 53 y.o with well controlled HIV cd4 730 VL <20 (followed by Dr. 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    She presents for follow up.  She saw [facility] 03/2013 for hepatitis C follow up.  They are treating her hepatitis C with Simprevir and Sofosbuvir x 12 weeks and she is receiving Twinrix vaccines.  1/5 she stopped her tx for hepatitis C is is feeling much better.  When she was taking the medication she felt like her Stamina was decreased and felt tired, nauseated, muscle weakness though she feels better after completing the medication.  She has an appt 1/15 for labs w/ ID and 1/27 with Dr. Johnnye Sima.  She will f/u with [facility] at 06/2013.  She likes the change in her HIV meds for now and feels like the [place] hump is improved   She recently was itching but it is improved after completing HCV tx.   She was stopped on Pravachol due to itching.  This may have caused DDI with other HCV/HIV meds.    Health maintenance: She had colonoscopy with Dr. Deatra Ina and is not due for colonoscopy currently until 2017, received flu shot for this year.       Review of Systems  Constitutional:       Fatigue though improving after d/cing HCV meds  Respiratory: Negative for shortness of breath.   Cardiovascular: Negative for chest pain.  Gastrointestinal: Negative for nausea and  constipation.  Musculoskeletal: Negative for back pain.  Skin:       Denies itching        Objective:   Physical Exam  Nursing note and vitals reviewed. Constitutional: She is oriented to person, place, and time. Vital signs are normal. She appears well-developed and well-nourished. She is cooperative. No distress.  HENT:  Head: Normocephalic and atraumatic.  Mouth/Throat: Oropharynx is clear and moist and mucous membranes are normal. No oropharyngeal exudate.  Eyes: Conjunctivae are normal. Right eye exhibits no discharge. Left eye exhibits no discharge. No scleral icterus.  Cardiovascular: Normal rate, regular rhythm, S1 normal, S2 normal and normal heart sounds.   No murmur heard. Trace lower ext edema   Pulmonary/Chest: Effort normal and breath sounds normal. No respiratory distress. She has no wheezes.  Abdominal: Soft. Bowel sounds are normal. There is no tenderness.  Obese ab  Neurological: She is alert and oriented to person, place, and time. Gait normal.  Skin: Skin is warm, dry and intact. No rash noted. She is not diaphoretic.  Psychiatric: She has a normal mood and affect. Her speech is normal and behavior is normal. Judgment and thought content normal. Cognition and memory are normal.          Assessment & Plan:  F/u in 4-6 months

## 2013-05-02 NOTE — Assessment & Plan Note (Addendum)
Controlled f/u 05/21/2013 with Dr. Johnnye Sima and for labs 05/09/13

## 2013-05-02 NOTE — Assessment & Plan Note (Signed)
Follows at [facility] completed 12 week tx with undetectable quant.

## 2013-05-02 NOTE — Addendum Note (Signed)
Addended by: Cresenciano Genre on: 05/02/2013 05:51 PM   Modules accepted: Level of Service

## 2013-05-02 NOTE — Patient Instructions (Addendum)
General Instructions: Follow up in 4-6 months  Take care  Read the information below  Nice seeing you today   Treatment Goals:  Goals (1 Years of Data) as of 05/02/13         As of Today 03/12/13 01/09/13 01/07/13 12/20/12     Blood Pressure    . Blood Pressure < 140/90  137/88 130/77 152/100 160/92 147/85      Progress Toward Treatment Goals:  Treatment Goal 05/02/2013  Blood pressure at goal    Self Care Goals & Plans:  Self Care Goal 05/02/2013  Manage my medications take my medicines as prescribed; bring my medications to every visit; refill my medications on time  Monitor my health keep track of my blood pressure; keep track of my weight  Eat healthy foods drink diet soda or water instead of juice or soda; eat more vegetables; eat foods that are low in salt; eat baked foods instead of fried foods; eat fruit for snacks and desserts; eat smaller portions  Be physically active find an activity I enjoy  Meeting treatment goals maintain the current self-care plan    No flowsheet data found.   Care Management & Community Referrals:  Referral 05/02/2013  Referrals made for care management support none needed  Referrals made to community resources none      Hypertension As your heart beats, it forces blood through your arteries. This force is your blood pressure. If the pressure is too high, it is called hypertension (HTN) or high blood pressure. HTN is dangerous because you may have it and not know it. High blood pressure may mean that your heart has to work harder to pump blood. Your arteries may be narrow or stiff. The extra work puts you at risk for heart disease, stroke, and other problems.  Blood pressure consists of two numbers, a higher number over a lower, 110/72, for example. It is stated as "110 over 72." The ideal is below 120 for the top number (systolic) and under 80 for the bottom (diastolic). Write down your blood pressure today. You should pay close attention to your  blood pressure if you have certain conditions such as:  Heart failure.  Prior heart attack.  Diabetes  Chronic kidney disease.  Prior stroke.  Multiple risk factors for heart disease. To see if you have HTN, your blood pressure should be measured while you are seated with your arm held at the level of the heart. It should be measured at least twice. A one-time elevated blood pressure reading (especially in the Emergency Department) does not mean that you need treatment. There may be conditions in which the blood pressure is different between your right and left arms. It is important to see your caregiver soon for a recheck. Most people have essential hypertension which means that there is not a specific cause. This type of high blood pressure may be lowered by changing lifestyle factors such as:  Stress.  Smoking.  Lack of exercise.  Excessive weight.  Drug/tobacco/alcohol use.  Eating less salt. Most people do not have symptoms from high blood pressure until it has caused damage to the body. Effective treatment can often prevent, delay or reduce that damage. TREATMENT  When a cause has been identified, treatment for high blood pressure is directed at the cause. There are a large number of medications to treat HTN. These fall into several categories, and your caregiver will help you select the medicines that are best for you. Medications may have  side effects. You should review side effects with your caregiver. If your blood pressure stays high after you have made lifestyle changes or started on medicines,   Your medication(s) may need to be changed.  Other problems may need to be addressed.  Be certain you understand your prescriptions, and know how and when to take your medicine.  Be sure to follow up with your caregiver within the time frame advised (usually within two weeks) to have your blood pressure rechecked and to review your medications.  If you are taking more than  one medicine to lower your blood pressure, make sure you know how and at what times they should be taken. Taking two medicines at the same time can result in blood pressure that is too low. SEEK IMMEDIATE MEDICAL CARE IF:  You develop a severe headache, blurred or changing vision, or confusion.  You have unusual weakness or numbness, or a faint feeling.  You have severe chest or abdominal pain, vomiting, or breathing problems. MAKE SURE YOU:   Understand these instructions.  Will watch your condition.  Will get help right away if you are not doing well or get worse. Document Released: 04/11/2005 Document Revised: 07/04/2011 Document Reviewed: 11/30/2007 [facility] Patient Information 2014 [place].   Back Pain, Adult Low back pain is very common. About 1 in 5 people have back pain.The cause of low back pain is rarely dangerous. The pain often gets better over time.About half of people with a sudden onset of back pain feel better in just 2 weeks. About 8 in 10 people feel better by 6 weeks.  CAUSES Some common causes of back pain include:  Strain of the muscles or ligaments supporting the spine.  Wear and tear (degeneration) of the spinal discs.  Arthritis.  Direct injury to the back. DIAGNOSIS Most of the time, the direct cause of low back pain is not known.However, back pain can be treated effectively even when the exact cause of the pain is unknown.Answering your caregiver's questions about your overall health and symptoms is one of the most accurate ways to make sure the cause of your pain is not dangerous. If your caregiver needs more information, he or she may order lab work or imaging tests (X-rays or MRIs).However, even if imaging tests show changes in your back, this usually does not require surgery. HOME CARE INSTRUCTIONS For many people, back pain returns.Since low back pain is rarely dangerous, it is often a condition that people can learn to [facility] their  own.   Remain active. It is stressful on the back to sit or stand in one place. Do not sit, drive, or stand in one place for more than 30 minutes at a time. Take short walks on level surfaces as soon as pain allows.Try to increase the length of time you walk each day.  Do not stay in bed.Resting more than 1 or 2 days can delay your recovery.  Do not avoid exercise or work.Your body is made to move.It is not dangerous to be active, even though your back may hurt.Your back will likely heal faster if you return to being active before your pain is gone.  Pay attention to your body when you bend and lift. Many people have less discomfortwhen lifting if they bend their knees, keep the load close to their bodies,and avoid twisting. Often, the most comfortable positions are those that put less stress on your recovering back.  Find a comfortable position to sleep. Use a firm mattress  and lie on your side with your knees slightly bent. If you lie on your back, put a pillow under your knees.  Only take over-the-counter or prescription medicines as directed by your caregiver. Over-the-counter medicines to reduce pain and inflammation are often the most helpful.Your caregiver may prescribe muscle relaxant drugs.These medicines help dull your pain so you can more quickly return to your normal activities and healthy exercise.  Put ice on the injured area.  Put ice in a plastic bag.  Place a towel between your skin and the bag.  Leave the ice on for 15-20 minutes, 03-04 times a day for the first 2 to 3 days. After that, ice and heat may be alternated to reduce pain and spasms.  Ask your caregiver about trying back exercises and gentle massage. This may be of some benefit.  Avoid feeling anxious or stressed.Stress increases muscle tension and can worsen back pain.It is important to recognize when you are anxious or stressed and learn ways to manage it.Exercise is a great option. SEEK MEDICAL  CARE IF:  You have pain that is not relieved with rest or medicine.  You have pain that does not improve in 1 week.  You have new symptoms.  You are generally not feeling well. SEEK IMMEDIATE MEDICAL CARE IF:   You have pain that radiates from your back into your legs.  You develop new bowel or bladder control problems.  You have unusual weakness or numbness in your arms or legs.  You develop nausea or vomiting.  You develop abdominal pain.  You feel faint. Document Released: 04/11/2005 Document Revised: 10/11/2011 Document Reviewed: 08/30/2010 [employer] Patient Information 2014 [place], [place].   Hepatitis C Hepatitis C is a viral infection of the liver. Infection may go undetected for months or years because symptoms may be absent or very mild. Chronic liver disease is the main danger of hepatitis C. This may lead to scarring of the liver (cirrhosis), liver failure, and liver cancer. CAUSES  Hepatitis C is caused by the hepatitis C virus (HCV). Formerly, hepatitis C infections were most commonly transmitted through blood transfusions. In the early 1990s, routine testing of donated blood for hepatitis C and exclusion of blood that tests positive for HCV began. Now, HCV is most commonly transmitted from person to person through injection drug use, sharing needles, or sex with an infected person. A caregiver may also get the infection from exposure to the blood of an infected patient by way of a cut or needle stick.  SYMPTOMS  Acute Phase Many cases of acute HCV infection are mild and cause few problems.Some people may not even realize they are sick.Symptoms in others may last a few weeks to several months and include:  Feeling very tired.  Loss of appetite.  Nausea.  Vomiting.  Abdominal pain.  Dark yellow urine.  Yellow skin and eyes (jaundice).  Itching of the skin. Chronic Phase  Between 50% to 85% of people who get HCV infection become "chronic carriers." They  often have no symptoms, but the virus stays in their body.They may spread the virus to others and can get long-term liver disease.  Many people with chronic HCV infection remain healthy for many years. However, up to 1 in 5 chronically infected people may develop severe liver diseases including scarring of the liver (cirrhosis), liver failure, or liver cancer. DIAGNOSIS  Diagnosis of hepatitis C infection is made by testing blood for the presence of hepatitis C viral particles called RNA. Other tests may  also be done to measure the status of current liver function, exclude other liver problems, or assess liver damage. TREATMENT  Treatment with many antiviral drugs is available and recommended for some patients with chronic HCV infection. Drug treatment is generally considered appropriate for patients who:  Are 7 years of age or older.  Have a positive test for HCV particles in the blood.  Have a liver tissue sample (biopsy) that shows chronic hepatitis and significant scarring (fibrosis).  Do not have signs of liver failure.  Have acceptable blood test results that confirm the wellness of other body organs.  Are willing to be treated and conform to treatment requirements.  Have no other circumstances that would prevent treatment from being recommended (contraindications). All people who are offered and choose to receive drug treatment must understand that careful medical follow up for many months and even years is crucial in order to make successful care possible. The goal of drug treatment is to eliminate any evidence of HCV in the blood on a long-term basis. This is called a "sustained virologic response" or SVR. Achieving a SVR is associated with a decrease in the chance of life-threatening liver problems, need for a liver transplant, liver cancer rates, and liver-related complications. Successful treatment currently requires taking treatment drugs for at least 24 weeks and up to 72 weeks.  An injected drug (interferon) given weekly and an oral antiviral medicine taken daily are usually prescribed. Side effects from these drugs are common and some may be very serious. Your response to treatment must be carefully monitored by both you and your caregiver throughout the entire treatment period. PREVENTION There is no vaccine for hepatitis C. The only way to prevent the disease is to reduce the risk of exposure to the virus.   Avoid sharing drug needles or personal items like toothbrushes, razors, and nail clippers with an infected person.  Healthcare workers need to avoid injuries and wear appropriate protective equipment such as gloves, gowns, and face masks when performing invasive medical or nursing procedures. HOME CARE INSTRUCTIONS  To avoid making your liver disease worse:  Strictly avoid drinking alcohol.  Carefully review all new prescriptions of medicines with your caregiver. Ask your caregiver which drugs you should avoid. The following drugs are toxic to the liver, and your caregiver may tell you to avoid them:  Isoniazid.  Methyldopa.  Acetaminophen.  Anabolic steroids (muscle-building drugs).  Erythromycin.  Oral contraceptives (birth control pills).  Check with your caregiver to make sure medicine you are currently taking will not be harmful.  Periodic blood tests may be required. Follow your caregiver's advice about when you should have blood tests.  Avoid a sexual relationship until advised otherwise by your caregiver.  Avoid activities that could expose other people to your blood. Examples include sharing a toothbrush, nail clippers, razors, and needles.  Bed rest is not necessary, but it may make you feel better. Recovery time is not related to the amount of rest you receive.  This infection is contagious. Follow your caregiver's instructions in order to avoid spread of the infection. SEEK IMMEDIATE MEDICAL CARE IF:  You have increasing fatigue or  weakness.  You have an oral temperature above 102 F (38.9 C), not controlled by medicine.  You develop loss of appetite, nausea, or vomiting.  You develop jaundice.  You develop easy bruising or bleeding.  You develop any severe problems as a result of your treatment. MAKE SURE YOU:   Understand these instructions.  Will watch  your condition.  Will get help right away if you are not doing well or get worse. Document Released: 04/08/2000 Document Revised: 07/04/2011 Document Reviewed: 08/11/2010 [facility] Patient Information 2014 [place], [place].  Simeprevir oral capsule What is this medicine? SIMEPREVIR (sim E pre vir) is an antiviral medicine. It is used with peginterferon alfa and ribavirin to treat hepatitis C. It will not work for colds, flu, or other viral infections. This medicine may be used for other purposes; ask your health care provider or pharmacist if you have questions. COMMON BRAND NAME(S): Olysio What should I tell my health care provider before I take this medicine? They need to know if you have any of these conditions: -[place] descent -man with a partner who is pregnant or trying to get pregnant -organ transplant -other liver disease -prolonged exposure to sunlight -an unusual or allergic reaction to simeprevir, ribavirin, interferons, other medicines, foods, dyes, or preservatives -pregnant or trying to get pregnant -breast-feeding How should I use this medicine? Take this medicine by mouth with a full glass of water. Follow the directions on the prescription label. Do not cut, crush or chew this medicine. Take this medicine with food. Take your medicine at regular intervals. Do not take your medicine more often than directed. Take all of your medicine as directed even if you think you are better. Do not skip doses or stop your medicine early. Talk to your pediatrician regarding the use of this medicine in children. Special care may be  needed. Overdosage: If you think you've taken too much of this medicine contact a poison control center or emergency room at once. Overdosage: If you think you have taken too much of this medicine contact a poison control center or emergency room at once. NOTE: This medicine is only for you. Do not share this medicine with others. What if I miss a dose? If you miss a dose, take it as soon as you can. If your next dose is to be taken in less than 12 hours, then do not take the missed dose. Take the next dose at your regular time. Do not take double or extra doses. What may interact with this medicine? -certain antibiotics like clarithromycin, erythromycin, telithromycin, rifabutin, rifampin, rifapentine -certain antiviral medicines for HIV or AIDS like atazanavir, cobicistat; elvitegravir; emtricitabine; tenofovir, darunavir, delavirdine, efavirenz, etravirine, fosamprenavir, indinavir, lopinavir; ritonavir, nelfinavir, nevirapine, ritonavir, saquinavir, tipranavir -certain medicines for blood pressure like amlodipine, diltiazem, felodipine, nicardipine, nifedipine, nisoldipine, verapamil -certain medicines for cholesterol like atorvastatin, lovastatin, pitavastatin, pravastatin, rosuvastatin, simvastatin -certain medicines for erectile dysfunction like sildenafil, tadalafil, vardenafil -certain medicines for fungal infections like fluconazole, itraconazole, ketoconazole, posaconazole, voriconazole -certain medicines for irregular heart beat like amiodarone, disopyramide, flecainide, mexiletine, propafenone, quinidine -certain medicines for seizures like carbamazepine, oxcarbazepine, phenobarbital, phenytoin -cispride -cyclosporine -dexamethasone -digoxin -midazolam -milk thistle -sirolimus -St. John's Wort -tacrolimus -triazolam -warfarin This list may not describe all possible interactions. Give your health care provider a list of all the medicines, herbs, non-prescription drugs, or  dietary supplements you use. Also tell them if you smoke, drink alcohol, or use illegal drugs. Some items may interact with your medicine. What should I watch for while using this medicine? See your doctor or health care professional for a follow-up visit as directed. You may need blood work while you are taking this medicine. Tell your doctor if your symptoms do not improve or if they get worse. This medicine must be given with ribavirin. Ribavirin may cause birth defects or death to an unborn  child. Women taking this medicine must avoid pregnancy while taking this medicine and for 6 months after stopping this medicine. Men who are taking this medicine must avoid getting a woman pregnant while taking this medicine and for 6 months after stopping this medicine. Use 2 forms of birth control. Women who can still have children must have a negative pregnancy test before starting treatment. Take monthly pregnancy tests while you are taking this medicine and for 6 months after this medicine is stopped. Talk to your doctor if you think that you or your partner are pregnant or are trying to become pregnant. This medicine can make you more sensitive to the sun. Keep out of the sun. If you cannot avoid being in the sun, wear protective clothing and use sunscreen. Do not use sun lamps or tanning beds/booths. What side effects may I notice from receiving this medicine? Side effects that you should report to your doctor or health care professional as soon as possible: -allergic reactions like skin rash, itching or hives, swelling of the face, lips, or tongue -sensitivity to light Side effects that usually do not require medical attention (Report these to your doctor or health care professional if they continue or are bothersome.): -breathing problems -itching -muscle pain -nausea -rash This list may not describe all possible side effects. Call your doctor for medical advice about side effects. You may report side  effects to FDA at 1-800-FDA-1088. Where should I keep my medicine? Keep out of the reach of children. Store at room temperature below 30 degrees C (86 degrees F). Throw away any unused medicine after the expiration date. NOTE: This sheet is a summary. It may not cover all possible information. If you have questions about this medicine, talk to your doctor, pharmacist, or health care provider.  2014, [employer] Standard. (2012-04-05 13:47:31)   Sofosbuvir oral tablet What is this medicine? SOFOSBUVIR (soe fos' bue vir) is an antiviral medicine. It is used with other medicines to treat hepatitis C. It will not work for colds, flu, or other viral infections. This medicine may be used for other purposes; ask your health care provider or pharmacist if you have questions. Sovaldi What should I tell my health care provider before I take this medicine? They need to know if you have any of these conditions: -HIV or AIDS -kidney disease -man with a partner who is pregnant or trying to get pregnant -organ transplant -other liver disease -an unusual or allergic reaction to sofosbuvir, ribavirin, interferons, other medicines, foods, dyes, or preservatives -pregnant or trying to get pregnant -breast-feeding How should I use this medicine? Take this medicine by mouth with a full glass of water. Follow the directions on the prescription label. You can take it with or without food. If it upsets your stomach, take it with food. Take your medicine at regular intervals. Do not take your medicine more often than directed. Take all of your medicine as directed even if you think you are better. Do not skip doses or stop your medicine early. Talk to your pediatrician regarding the use of this medicine in children. Special care may be needed. Overdosage: If you think you've taken too much of this medicine contact a poison control center or emergency room at once. Overdosage: If you think you have taken too  much of this medicine contact a poison control center or emergency room at once. NOTE: This medicine is only for you. Do not share this medicine with others. What if I miss a dose?  If you miss a dose, take it as soon as you can. If it is almost time for your next dose, take only that dose. Do not take double or extra doses. What may interact with this medicine? -certain antibiotics like rifabutin, rifampin, rifapentine -certain medicines for seizures like carbamazepine, oxcarbazepine, phenobarbital, phenytoin -St. John's Wort -tipranavir This list may not describe all possible interactions. Give your health care provider a list of all the medicines, herbs, non-prescription drugs, or dietary supplements you use. Also tell them if you smoke, drink alcohol, or use illegal drugs. Some items may interact with your medicine. What should I watch for while using this medicine? See your doctor or health care professional for a follow-up visit as directed. You may need blood work while you are taking this medicine. Tell your doctor if your symptoms do not improve or if they get worse. This medicine must be given with ribavirin. Ribavirin may cause birth defects or death to an unborn child. Women taking this medicine must avoid pregnancy while taking this medicine and for 6 months after stopping this medicine. Men who are taking this medicine must avoid getting a woman pregnant while taking this medicine and for 6 months after stopping this medicine. Use 2 forms of effective birth control. Women who can still have children must have a negative pregnancy test before starting treatment. Take monthly pregnancy tests while you are taking this medicine and for 6 months after this medicine is stopped. Talk to your doctor if you think that you or your partner are pregnant or are trying to become pregnant. What side effects may I notice from receiving this medicine? Side effects that you should report to your doctor or  health care professional as soon as possible: -allergic reactions like skin rash, itching or hives, swelling of the face, lips, or tongue -depressed mood -fever or chills, sore throat -unusually weak or tired Side effects that usually do not require medical attention (Report these to your doctor or health care professional if they continue or are bothersome.): -headache -nausea -tiredness -trouble sleeping This list may not describe all possible side effects. Call your doctor for medical advice about side effects. You may report side effects to FDA at 1-800-FDA-1088. Where should I keep my medicine? Keep out of the reach of children. Store at room temperature below 30 degrees C (86 degrees F). Throw away any unused medicine after the expiration date. NOTE: This sheet is a summary. It may not cover all possible information. If you have questions about this medicine, talk to your doctor, pharmacist, or health care provider.  2014, Elsevier/Gold Standard. (2012-04-05 13:33:04)

## 2013-05-02 NOTE — Assessment & Plan Note (Signed)
Recently dc'ed Pravastatin due to itching which could have been DDI with HIV/HCV meds  Will reassess for need in the future   Lipid Panel     Component Value Date/Time   CHOL 192 08/14/2012 0918   TRIG 61 08/14/2012 0918   HDL 68 08/14/2012 0918   CHOLHDL 2.8 08/14/2012 0918   VLDL 12 08/14/2012 0918   LDLCALC 112* 08/14/2012 4128

## 2013-05-02 NOTE — Assessment & Plan Note (Signed)
BP Readings from Last 3 Encounters:  05/02/13 137/88  03/12/13 130/77  01/09/13 152/100    Lab Results  Component Value Date   NA 139 12/26/2012   K 4.1 12/26/2012   CREATININE 0.77 12/26/2012    Assessment: Blood pressure control: controlled Progress toward BP goal:  at goal Comments: none  Plan: Medications:  continue current medications Educational resources provided: brochure;handout Self management tools provided: home blood pressure logbook;other (see comments) Other plans: none f/u in 4-6 mos

## 2013-05-07 NOTE — Progress Notes (Signed)
Case discussed with Dr. McLean soon after the resident saw the patient.  We reviewed the resident's history and exam and pertinent patient test results.  I agree with the assessment, diagnosis, and plan of care documented in the resident's note. 

## 2013-05-09 ENCOUNTER — Other Ambulatory Visit (INDEPENDENT_AMBULATORY_CARE_PROVIDER_SITE_OTHER): Payer: Medicaid Other

## 2013-05-09 DIAGNOSIS — B20 Human immunodeficiency virus [HIV] disease: Secondary | ICD-10-CM

## 2013-05-09 DIAGNOSIS — Z113 Encounter for screening for infections with a predominantly sexual mode of transmission: Secondary | ICD-10-CM

## 2013-05-09 DIAGNOSIS — Z79899 Other long term (current) drug therapy: Secondary | ICD-10-CM

## 2013-05-09 LAB — COMPLETE METABOLIC PANEL WITH GFR
ALK PHOS: 155 U/L — AB (ref 39–117)
ALT: 17 U/L (ref 0–35)
AST: 21 U/L (ref 0–37)
Albumin: 4.6 g/dL (ref 3.5–5.2)
BUN: 9 mg/dL (ref 6–23)
CO2: 24 mEq/L (ref 19–32)
Calcium: 9.6 mg/dL (ref 8.4–10.5)
Chloride: 105 mEq/L (ref 96–112)
Creat: 0.63 mg/dL (ref 0.50–1.10)
GFR, Est African American: >89 mL/min
GLUCOSE: 86 mg/dL (ref 70–99)
Potassium: 3.9 mEq/L (ref 3.5–5.3)
SODIUM: 139 meq/L (ref 135–145)
TOTAL PROTEIN: 7.9 g/dL (ref 6.0–8.3)
Total Bilirubin: 0.4 mg/dL (ref 0.3–1.2)

## 2013-05-09 LAB — LIPID PANEL
CHOLESTEROL: 168 mg/dL (ref 0–200)
HDL: 32 mg/dL — ABNORMAL LOW (ref >39)
LDL Cholesterol: 109 mg/dL — ABNORMAL HIGH (ref 0–99)
Total CHOL/HDL Ratio: 5.3 Ratio
Triglycerides: 133 mg/dL (ref <150)
VLDL: 27 mg/dL (ref 0–40)

## 2013-05-09 LAB — CBC WITH DIFFERENTIAL/PLATELET
BASOS PCT: 0 % (ref 0–1)
Basophils Absolute: 0 10*3/uL (ref 0.0–0.1)
EOS ABS: 0.2 10*3/uL (ref 0.0–0.7)
Eosinophils Relative: 2 % (ref 0–5)
HCT: 39.6 % (ref 36.0–46.0)
HEMOGLOBIN: 13.6 g/dL (ref 12.0–15.0)
Lymphocytes Relative: 36 % (ref 12–46)
Lymphs Abs: 3.4 10*3/uL (ref 0.7–4.0)
MCH: 26.2 pg (ref 26.0–34.0)
MCHC: 34.3 g/dL (ref 30.0–36.0)
MCV: 76.2 fL — ABNORMAL LOW (ref 78.0–100.0)
MONOS PCT: 6 % (ref 3–12)
Monocytes Absolute: 0.5 10*3/uL (ref 0.1–1.0)
NEUTROS PCT: 56 % (ref 43–77)
Neutro Abs: 5.3 10*3/uL (ref 1.7–7.7)
Platelets: 276 10*3/uL (ref 150–400)
RBC: 5.2 MIL/uL — ABNORMAL HIGH (ref 3.87–5.11)
RDW: 16.7 % — ABNORMAL HIGH (ref 11.5–15.5)
WBC: 9.4 10*3/uL (ref 4.0–10.5)

## 2013-05-09 LAB — RPR

## 2013-05-12 LAB — HIV-1 RNA QUANT-NO REFLEX-BLD

## 2013-05-13 LAB — T-HELPER CELL (CD4) - (RCID CLINIC ONLY)
CD4 T CELL HELPER: 26 % — AB (ref 33–55)
CD4 T Cell Abs: 800 /uL (ref 400–2700)

## 2013-05-15 ENCOUNTER — Other Ambulatory Visit: Payer: Self-pay | Admitting: Internal Medicine

## 2013-05-21 ENCOUNTER — Ambulatory Visit (INDEPENDENT_AMBULATORY_CARE_PROVIDER_SITE_OTHER): Payer: Medicaid Other | Admitting: Infectious Diseases

## 2013-05-21 ENCOUNTER — Encounter: Payer: Self-pay | Admitting: Infectious Diseases

## 2013-05-21 VITALS — BP 162/90 | HR 97 | Temp 97.0°F | Wt 149.0 lb

## 2013-05-21 DIAGNOSIS — B1710 Acute hepatitis C without hepatic coma: Secondary | ICD-10-CM

## 2013-05-21 DIAGNOSIS — Z113 Encounter for screening for infections with a predominantly sexual mode of transmission: Secondary | ICD-10-CM

## 2013-05-21 DIAGNOSIS — I10 Essential (primary) hypertension: Secondary | ICD-10-CM

## 2013-05-21 DIAGNOSIS — Z23 Encounter for immunization: Secondary | ICD-10-CM

## 2013-05-21 DIAGNOSIS — Z79899 Other long term (current) drug therapy: Secondary | ICD-10-CM

## 2013-05-21 DIAGNOSIS — B20 Human immunodeficiency virus [HIV] disease: Secondary | ICD-10-CM

## 2013-05-21 MED ORDER — HYDROXYZINE HCL 25 MG PO TABS: 25.0000 mg | ORAL_TABLET | Freq: Three times a day (TID) | ORAL | Status: DC | PRN

## 2013-05-21 NOTE — Assessment & Plan Note (Signed)
She is doing very well. Had problems with PI and with previous qday INI. Given that will leave her on bid INI (ISN). Will see her back in 6 months. Will refill her hydroxizine. She is given condoms. Restart Hep B series.

## 2013-05-21 NOTE — Assessment & Plan Note (Signed)
has f/u at [facility]. Will see if she has "cured".

## 2013-05-21 NOTE — Addendum Note (Signed)
Addended by: Reggy Eye on: 05/21/2013 03:01 PM   Modules accepted: Orders

## 2013-05-21 NOTE — Progress Notes (Signed)
   Subjective:    [name], female    DOB: 1961/02/22, 53 y.o.   MRN: [id number]  HPI 53 yo F with hx of Hep C and HIV+ (LXVr/TRV). She had nasal septoplasty 01-05-10. She was previously changed to [place] in 2013. She was then changed to TRV/DTGV in anticipation of starting Hep C treatment. She developed a rash on this and was then changed to ISN/TRV.  She has been on Hep C therapy which she completed January 2015 (at [facility]).  C/o continued pruritis. NO periods.  Having fatigue, back pain, starting to improve since off Hep C rx.   HIV 1 RNA Quant (copies/mL)  Date Value  05/09/2013 <20   12/26/2012 <20   08/14/2012 <20      CD4 T Cell Abs (/uL)  Date Value  05/09/2013 800   12/26/2012 730   08/14/2012 510     Knows she needs pap, having back pain today.  Review of Systems  Constitutional: Positive for fatigue. Negative for appetite change and unexpected weight change.  Gastrointestinal: Negative for diarrhea and constipation.  Genitourinary: Negative for difficulty urinating and menstrual problem.  Musculoskeletal: Positive for back pain.      Objective:   Physical Exam  Constitutional: She appears well-developed and well-nourished.  HENT:  Mouth/Throat: No oropharyngeal exudate.  Eyes: EOM are normal.  Neck: Normal range of motion. Neck supple.  Cardiovascular: Normal rate, regular rhythm and normal heart sounds.   Pulmonary/Chest: Effort normal and breath sounds normal.  Abdominal: Soft. Bowel sounds are normal. She exhibits no distension. There is no tenderness.  Lymphadenopathy:    She has no cervical adenopathy.  Skin: Skin is warm. No rash noted.       Assessment & Plan:

## 2013-05-21 NOTE — Assessment & Plan Note (Signed)
Greatly appreciate Dr Claris Gladden partnering with [place].

## 2013-05-23 ENCOUNTER — Ambulatory Visit: Payer: Medicaid Other | Admitting: Infectious Diseases

## 2013-05-29 ENCOUNTER — Telehealth: Payer: Self-pay

## 2013-05-29 NOTE — Telephone Encounter (Signed)
Patient called to see if she could get an earlier appointment.

## 2013-06-07 ENCOUNTER — Other Ambulatory Visit: Payer: Self-pay | Admitting: Internal Medicine

## 2013-06-07 NOTE — Telephone Encounter (Signed)
Rx called in to pharmacy. 

## 2013-06-11 ENCOUNTER — Ambulatory Visit: Payer: Medicaid Other | Admitting: Physical Medicine & Rehabilitation

## 2013-06-13 ENCOUNTER — Other Ambulatory Visit: Payer: Self-pay | Admitting: Internal Medicine

## 2013-06-25 ENCOUNTER — Ambulatory Visit: Payer: Medicaid Other | Admitting: Physical Medicine & Rehabilitation

## 2013-07-12 ENCOUNTER — Other Ambulatory Visit: Payer: Self-pay | Admitting: Internal Medicine

## 2013-07-12 NOTE — Telephone Encounter (Signed)
Called to pharm 

## 2013-07-18 ENCOUNTER — Encounter: Payer: Medicaid Other | Attending: Physical Medicine & Rehabilitation

## 2013-07-18 ENCOUNTER — Ambulatory Visit (HOSPITAL_BASED_OUTPATIENT_CLINIC_OR_DEPARTMENT_OTHER): Payer: Medicaid Other | Admitting: Physical Medicine & Rehabilitation

## 2013-07-18 ENCOUNTER — Encounter: Payer: Self-pay | Admitting: Physical Medicine & Rehabilitation

## 2013-07-18 VITALS — BP 143/72 | HR 90 | Resp 14 | Ht 61.0 in | Wt 150.0 lb

## 2013-07-18 DIAGNOSIS — M545 Low back pain, unspecified: Secondary | ICD-10-CM | POA: Insufficient documentation

## 2013-07-18 DIAGNOSIS — M47817 Spondylosis without myelopathy or radiculopathy, lumbosacral region: Secondary | ICD-10-CM

## 2013-07-18 NOTE — Progress Notes (Signed)
Bilateral Lumbar L3, L4  medial branch blocks and L 5 dorsal ramus injection under fluoroscopic guidance  Indication: Lumbar pain which is not relieved by medication management or other conservative care and interfering with self-care and mobility. Prior procedure performed in April 2014 lasted 3-4 months. Pain has now occurred.  Last CD 4 count Sept 2014 was 730 with 28% T helper cells, improved vs prior result  Informed consent was obtained after describing risks and benefits of the procedure with the patient, this includes bleeding, infection, paralysis and medication side effects.  The patient wishes to proceed and has given written consent. The patient has well-controlled HIV. Reviewed most recent lab work. The patient was placed in prone position.  The lumbar area was marked and prepped with Betadine.  One mL of 1% lidocaine was injected into each of 6 areas into the skin and subcutaneous tissue.  Then a 22-gauge 3.5 spinal needle was inserted targeting the junction of the left S1 superior articular process and sacral ala junction. Needle was advanced under fluoroscopic guidance.  Bone contact was made.  Omnipaque 180 was injected x 0.5 mL demonstrating no intravascular uptake.  Then a solution containing one mL of 4 mg per mL dexamethasone and 3 mL of 2% MPF lidocaine was injected x 0.5 mL.  Then the left L5 superior articular process in transverse process junction was targeted.  Bone contact was made.  Omnipaque 180 was injected x 0.5 mL demonstrating no intravascular uptake. Then a solution containing one mL of 4 mg per mL dexamethasone and 3 mL of 2% MPF lidocaine was injected x 0.5 mL.  Then the left L4 superior articular process in transverse process junction was targeted.  Bone contact was made.  Omnipaque 180 was injected x 0.5 mL demonstrating no intravascular uptake.  Then a solution containing one mL of 4 mg per mL dexamethasone and 3 mL if 2% MPF lidocaine was injected x 0.5 mL.  This same  procedure was performed on the right side using the same needle, technique and injectate.  Patient tolerated procedure well.  Post procedure instructions were given.  Repeat in 70months, this has been average duration of benefit for this pt

## 2013-07-18 NOTE — Patient Instructions (Signed)
Back Exercises These exercises may help you when beginning to rehabilitate your injury. Your symptoms may resolve with or without further involvement from your physician, physical therapist or athletic trainer. While completing these exercises, remember:   Restoring tissue flexibility helps normal motion to return to the joints. This allows healthier, less painful movement and activity.  An effective stretch should be held for at least 30 seconds.  A stretch should never be painful. You should only feel a gentle lengthening or release in the stretched tissue. STRETCH  Extension, Prone on Elbows   Lie on your stomach on the floor, a bed will be too soft. Place your palms about shoulder width apart and at the height of your head.  Place your elbows under your shoulders. If this is too painful, stack pillows under your chest.  Allow your body to relax so that your hips drop lower and make contact more completely with the floor.  Hold this position for __________ seconds.  Slowly return to lying flat on the floor. Repeat __________ times. Complete this exercise __________ times per day.  RANGE OF MOTION  Extension, Prone Press Ups   Lie on your stomach on the floor, a bed will be too soft. Place your palms about shoulder width apart and at the height of your head.  Keeping your back as relaxed as possible, slowly straighten your elbows while keeping your hips on the floor. You may adjust the placement of your hands to maximize your comfort. As you gain motion, your hands will come more underneath your shoulders.  Hold this position __________ seconds.  Slowly return to lying flat on the floor. Repeat __________ times. Complete this exercise __________ times per day.  RANGE OF MOTION- Quadruped, Neutral Spine   Assume a hands and knees position on a firm surface. Keep your hands under your shoulders and your knees under your hips. You may place padding under your knees for comfort.  Drop  your head and point your tail bone toward the ground below you. This will round out your low back like an angry cat. Hold this position for __________ seconds.  Slowly lift your head and release your tail bone so that your back sags into a large arch, like an old horse.  Hold this position for __________ seconds.  Repeat this until you feel limber in your low back.  Now, find your "sweet spot." This will be the most comfortable position somewhere between the two previous positions. This is your neutral spine. Once you have found this position, tense your stomach muscles to support your low back.  Hold this position for __________ seconds. Repeat __________ times. Complete this exercise __________ times per day.  STRETCH  Flexion, Single Knee to Chest   Lie on a firm bed or floor with both legs extended in front of you.  Keeping one leg in contact with the floor, bring your opposite knee to your chest. Hold your leg in place by either grabbing behind your thigh or at your knee.  Pull until you feel a gentle stretch in your low back. Hold __________ seconds.  Slowly release your grasp and repeat the exercise with the opposite side. Repeat __________ times. Complete this exercise __________ times per day.  STRETCH - Hamstrings, Standing  Stand or sit and extend your right / left leg, placing your foot on a chair or foot stool  Keeping a slight arch in your low back and your hips straight forward.  Lead with your chest and   lean forward at the waist until you feel a gentle stretch in the back of your right / left knee or thigh. (When done correctly, this exercise requires leaning only a small distance.)  Hold this position for __________ seconds. Repeat __________ times. Complete this stretch __________ times per day. STRENGTHENING  Deep Abdominals, Pelvic Tilt   Lie on a firm bed or floor. Keeping your legs in front of you, bend your knees so they are both pointed toward the ceiling and  your feet are flat on the floor.  Tense your lower abdominal muscles to press your low back into the floor. This motion will rotate your pelvis so that your tail bone is scooping upwards rather than pointing at your feet or into the floor.  With a gentle tension and even breathing, hold this position for __________ seconds. Repeat __________ times. Complete this exercise __________ times per day.  STRENGTHENING  Abdominals, Crunches   Lie on a firm bed or floor. Keeping your legs in front of you, bend your knees so they are both pointed toward the ceiling and your feet are flat on the floor. Cross your arms over your chest.  Slightly tip your chin down without bending your neck.  Tense your abdominals and slowly lift your trunk high enough to just clear your shoulder blades. Lifting higher can put excessive stress on the low back and does not further strengthen your abdominal muscles.  Control your return to the starting position. Repeat __________ times. Complete this exercise __________ times per day.  STRENGTHENING  Quadruped, Opposite UE/LE Lift   Assume a hands and knees position on a firm surface. Keep your hands under your shoulders and your knees under your hips. You may place padding under your knees for comfort.  Find your neutral spine and gently tense your abdominal muscles so that you can maintain this position. Your shoulders and hips should form a rectangle that is parallel with the floor and is not twisted.  Keeping your trunk steady, lift your right hand no higher than your shoulder and then your left leg no higher than your hip. Make sure you are not holding your breath. Hold this position __________ seconds.  Continuing to keep your abdominal muscles tense and your back steady, slowly return to your starting position. Repeat with the opposite arm and leg. Repeat __________ times. Complete this exercise __________ times per day. Document Released: 04/29/2005 Document  Revised: 07/04/2011 Document Reviewed: 07/24/2008 ExitCare Patient Information 2014 [employer].  

## 2013-07-18 NOTE — Progress Notes (Signed)
  PROCEDURE RECORD [facility]   Name: [name] [name] DOB:July 14, 1960 MRN: [id number]  Date:07/18/2013  Physician: Alysia Penna, MD    Nurse/CMA: Vonna Drafts, CMA  Allergies:  Allergies  Allergen Reactions  . Acetaminophen Other (See Comments)    Inflamed liver, hospitalized REACTION: Had liver problems, was hospitalized  . Dyazide [Hydrochlorothiazide W-Triamterene] Hives  . Lisinopril     Cough   . Triamterene   . Morphine Rash    REACTION: rash    Consent Signed: yes  Is patient diabetic? no    Pregnant: no LMP: No LMP recorded. Patient is postmenopausal. (age 41-55)  Anticoagulants: no Anti-inflammatory: no Antibiotics: no  Procedure: Bilateral Medial Branch Block  Position: Prone Start Time: 1235  End Time: 1249  Fluoro Time: 31  RN/CMA Levens,CMA Levens,CMA    Time 1228 1251    BP 143/73 157/88    Pulse 90 96    Respirations 14 14    O2 Sat 99 99    S/S 6 6    Pain Level 10/10      D/C home with Kendal Hymen Sister in law, [name] & O X 3, D/C instructions reviewed, and sits independently.

## 2013-07-25 ENCOUNTER — Encounter: Payer: Self-pay | Admitting: Internal Medicine

## 2013-08-02 ENCOUNTER — Other Ambulatory Visit (HOSPITAL_COMMUNITY)
Admission: RE | Admit: 2013-08-02 | Discharge: 2013-08-02 | Disposition: A | Discharge status: Home / Self Care | Payer: Medicaid Other | Source: Ambulatory Visit | Attending: Infectious Diseases | Admitting: Infectious Diseases

## 2013-08-02 ENCOUNTER — Ambulatory Visit (INDEPENDENT_AMBULATORY_CARE_PROVIDER_SITE_OTHER): Payer: Medicaid Other | Admitting: *Deleted

## 2013-08-02 DIAGNOSIS — Z1231 Encounter for screening mammogram for malignant neoplasm of breast: Secondary | ICD-10-CM

## 2013-08-02 DIAGNOSIS — Z124 Encounter for screening for malignant neoplasm of cervix: Secondary | ICD-10-CM

## 2013-08-02 DIAGNOSIS — Z01419 Encounter for gynecological examination (general) (routine) without abnormal findings: Secondary | ICD-10-CM | POA: Insufficient documentation

## 2013-08-02 NOTE — Progress Notes (Signed)
  Subjective:     [name] [name] is a 53 y.o. woman who comes in today for a  pap smear only.  Previous abnormal Pap smears: no. Contraception: condoms.  Completed Hepatits C treatment in January at [facility].  Objective:    There were no vitals taken for this visit. Pelvic Exam:  Pap smear obtained.   Assessment:    Screening pap smear.   Plan:    Follow up in one year, or as indicated by Pap results.  Pt given educational materials re: HIV and women, self-esteem, BSE, nutrition and diet management, PAP smears and partner safety. Pt given condoms.

## 2013-08-02 NOTE — Patient Instructions (Signed)
Your results will be ready in about a week.  I will mail them to you.  Thank you for coming to the Center for your care.  Arsh Feutz,  RN 

## 2013-08-07 ENCOUNTER — Encounter: Payer: Self-pay | Admitting: Internal Medicine

## 2013-08-07 ENCOUNTER — Encounter: Payer: Self-pay | Admitting: *Deleted

## 2013-08-08 ENCOUNTER — Other Ambulatory Visit: Payer: Self-pay | Admitting: Internal Medicine

## 2013-08-09 NOTE — Telephone Encounter (Signed)
Rx called in 

## 2013-08-23 ENCOUNTER — Telehealth: Payer: Self-pay | Admitting: *Deleted

## 2013-08-23 ENCOUNTER — Ambulatory Visit (INDEPENDENT_AMBULATORY_CARE_PROVIDER_SITE_OTHER): Payer: Medicaid Other | Admitting: Internal Medicine

## 2013-08-23 ENCOUNTER — Encounter: Payer: Self-pay | Admitting: Internal Medicine

## 2013-08-23 DIAGNOSIS — K130 Diseases of lips: Secondary | ICD-10-CM

## 2013-08-23 MED ORDER — DOXYCYCLINE HYCLATE 50 MG PO CAPS: 50.0000 mg | ORAL_CAPSULE | Freq: Two times a day (BID) | ORAL | Status: DC

## 2013-08-23 NOTE — Telephone Encounter (Signed)
Pt called with c/o upper lip on rt side swelling since yesterday.  No other swelling noted, Denies SOB Able to eat and swallow.  It feels like a knot is present.  Will see at 1:15 today Instructed to go to ED if any changes or increase swelling.

## 2013-08-23 NOTE — Patient Instructions (Signed)
Take the antibiotic as recommended. Take Naproxen twice daily as recommended for a week. Apply warm compresses 3-4 times daily. Do anti-bacterial mouth washes twice daily. Follow up with your dentist as scheduled. If your symptoms worsen, please give [place] a call or seek medical help.

## 2013-08-23 NOTE — Assessment & Plan Note (Signed)
Clinical examination consistent with right upper lip abscess. Serosanguinous fluid was collected upon applying even pressure over the lesion. Discussed with the attending Dr. Ellwood Dense regarding further management and plan.  Plans: Discharge sent for cultures. Start Doxycycline 100 mg BID for 7 days. Start Naproxen  Apply warm compresses 3-4 times daily. Tramadol as needed for pain Follow up as needed or if symptoms worsen.

## 2013-08-23 NOTE — Progress Notes (Signed)
Subjective:   [name] female   DOB: 26-May-1960 53 y.o.   MRN: [id number]  HPI: [name] is a 53 y.o. woman with PMH significant for HIV disease, Hepatitis C, HTN, HLD comes to the office with CC of right half of upper lip swelling since yesterday.  Patient reports that she woke up yesterday morning with swelling of right half of upper lip. She reports associated throbbing pain but denies any itching, fever, runny nose, sore throat, SOB, cough. She applied icing over the right lip couple of times yesterday and continued to take her zyrtec. She states that her swelling got slightly worse this morning even though the pain has subsided slightly. She states that she could feel a knot like structure underneath her right upper lip.  She denies any trauma to the lip, starting any new medications, any insect bites. She denies any other complaints during this office visit.   Past Medical History  Diagnosis Date  . HIV infection     1994  . Hepatitis C     1b; 02/2012 viral quant [id number]  . Hypertension   . GERD (gastroesophageal reflux disease)   . Allergy   . Depression   . CHF (congestive heart failure)   . Diverticulitis   . Arthritis   . Chronic back pain   . Hyperlipidemia   . Muscle cramps at night   . History of shingles    Current Outpatient Prescriptions  Medication Sig Dispense Refill  . amitriptyline (ELAVIL) 25 MG tablet Take 1 tablet (25 mg total) by mouth at bedtime.  30 tablet  1  . amLODipine (NORVASC) 10 MG tablet TAKE 1 TABLET BY MOUTH EVERY DAY  90 tablet  1  . cetirizine (ZYRTEC) 10 MG tablet TAKE 1 TABLET BY MOUTH ONCE DAILY AS NEEDED FOR ALLERGIES  90 tablet  1  . diazepam (VALIUM) 5 MG tablet TAKE 1 TABLET BY MOUTH EVERY NIGHT AT BEDTIME AS NEEDED  30 tablet  1        . emtricitabine-tenofovir (TRUVADA) 200-300 MG per tablet Take 1 tablet by mouth daily.  30 tablet  6  . fluticasone (FLONASE) 50 MCG/ACT nasal spray INSTILL 2 SPRAYS IN  EACH NOSTRIL DAILY AS NEEDED  16 g  2  . hydrOXYzine (ATARAX/VISTARIL) 25 MG tablet Take 1 tablet (25 mg total) by mouth every 8 (eight) hours as needed for itching.  60 tablet  3  . montelukast (SINGULAIR) 10 MG tablet TAKE 1 TABLET BY MOUTH EVERY DAY  90 tablet  1  . Multiple Vitamin (MULTIVITAMIN) capsule Take 1 capsule by mouth.      . naproxen (NAPROSYN) 500 MG tablet TAKE 1 TABLET BY MOUTH TWICE DAILY BETWEEN MEALS AS NEEDED FOR BACK PAIN  60 tablet  1  . Olopatadine HCl (PATADAY) 0.2 % SOLN Apply 1 drop to eye daily as needed. For allergies       . ondansetron (ZOFRAN-ODT) 4 MG disintegrating tablet TAKE 1 TABLET BY MOUTH EVERY 8 HOURS AS NEEDED FOR NAUSEA.  90 tablet  0  . pantoprazole (PROTONIX) 20 MG tablet TAKE 1 TABLET BY MOUTH 2 TIMES DAILY.  180 tablet  0  . potassium chloride 40 MEQ/15ML (20%) LIQD TAKE 15ML BY MOUTH EVERY MORNING  473 mL  1  . potassium chloride 40 MEQ/15ML (20%) LIQD TAKE 15ML BY MOUTH EVERY MORNING  473 mL  1  . pravastatin (PRAVACHOL) 20 MG tablet Take 20 mg by mouth.      Marland Kitchen  PROAIR HFA 108 (90 BASE) MCG/ACT inhaler INHALE 1-2 PUFFS BY MOUTH INTO THE LUNGS EVERY 6 HOURS AS NEEDED  1 Inhaler  2  . raltegravir (ISENTRESS) 400 MG tablet Take 1 tablet (400 mg total) by mouth 2 (two) times daily.  60 tablet  6  . traMADol (ULTRAM) 50 MG tablet TAKE 2 TABLETS BY MOUTH EVERY 4-6 HOURS AS NEEDED FOR PAIN  90 tablet  1  . [DISCONTINUED] hydrochlorothiazide (HYDRODIURIL) 25 MG tablet Take 25 mg by mouth daily.       No current facility-administered medications for this visit.   Family History  Problem Relation Age of Onset  . Heart disease Mother   . Diabetes Mother   . Stroke Mother   . Heart disease Father   . Stroke Father   . Diabetes Father   . Hepatitis Sister     hcv  . Stroke Other   . Colon polyps Brother    History   Social History  . Marital Status: Single    Spouse Name: N/A    Number of Children: 0  . Years of Education: N/A   Social  History Main Topics  . Smoking status: Former Smoker    Types: Cigarettes    Quit date: 04/26/2007  . Smokeless tobacco: Never Used     Comment: QUIT 2009  . Alcohol Use: No  . Drug Use: No  . Sexual Activity: Yes    Partners: Male     Comment: pt. given condoms   Other Topics Concern  . Not on file   Social History Narrative   Alternate # [phone]   Review of Systems: Pertinent items are noted in HPI. Objective:  Physical Exam: There were no vitals filed for this visit. BP 152/84 Temp: 98.3 Pulse: 87/min O2: 98% on RA  Constitutional: Vital signs reviewed.   Patient is a well-developed and well-nourished and is in no acute distress and cooperative with exam. Alert and oriented x3.  Head: Normocephalic and atraumatic Mouth: The right half of the upper lip is swollen, slightly erythematous. The right upper lips is tender to palpation. There is a knot like structure within the right upper lip. The inside examination of the right upper lip revealed a small punctum and when even pressure is applied on the knot like structure, serosanguinous fluid came out. The fluid is collected and sent for culture. Povidone iodine solution is applied topically over the punctum. No other signs of infection noted in the oral cavity.  Cardiovascular: RRR, S1 normal, S2 normal. Pulmonary/Chest: normal respiratory effort, CTAB. Skin: Warm, dry and intact.  Psychiatric: Normal mood and affect.    Assessment & Plan:

## 2013-08-23 NOTE — Progress Notes (Signed)
Case discussed with Dr. Boggala at the time of the visit.  We reviewed the resident's history and exam and pertinent patient test results.  I agree with the assessment, diagnosis, and plan of care documented in the resident's note. 

## 2013-08-23 NOTE — Telephone Encounter (Signed)
Agree, thanks

## 2013-08-26 LAB — WOUND CULTURE

## 2013-08-29 ENCOUNTER — Ambulatory Visit (INDEPENDENT_AMBULATORY_CARE_PROVIDER_SITE_OTHER): Payer: Medicaid Other | Admitting: Internal Medicine

## 2013-08-29 ENCOUNTER — Encounter: Payer: Self-pay | Admitting: Internal Medicine

## 2013-08-29 VITALS — BP 143/94 | HR 96 | Temp 97.7°F | Ht 61.0 in | Wt 153.4 lb

## 2013-08-29 DIAGNOSIS — B20 Human immunodeficiency virus [HIV] disease: Secondary | ICD-10-CM

## 2013-08-29 DIAGNOSIS — K130 Diseases of lips: Secondary | ICD-10-CM

## 2013-08-29 DIAGNOSIS — E785 Hyperlipidemia, unspecified: Secondary | ICD-10-CM

## 2013-08-29 DIAGNOSIS — I10 Essential (primary) hypertension: Secondary | ICD-10-CM

## 2013-08-29 DIAGNOSIS — M545 Low back pain, unspecified: Secondary | ICD-10-CM

## 2013-08-29 DIAGNOSIS — B1920 Unspecified viral hepatitis C without hepatic coma: Secondary | ICD-10-CM

## 2013-08-29 NOTE — Progress Notes (Signed)
   Subjective:    [name], female    DOB: [birth year], 53 y.o.   MRN: [id number]  HPI Comments: 53 y.o with well controlled HIV cd4 800 VL <20 (followed by Dr. Johnnye Sima f/u due in 10/2013), hepatitis C 1b (stage 2 via bipsy 03/2012 no evidence of cirrhosis, 03/2013 HCV quant was not detected Alkaline phosphase elevated to 151 according to labs-following at [facility] started on Simeprevir and Sofosbuvir 01/2013 x 12 weeks after tx HCV undetected 06/2013), HTN (BP 143/94), dyslipidemia (04/2013 109), allergic rhinitis, GERD, insomnia, chronic low back pain s/p lumbar branch block for arthritis and bulging disk (Ultram also helps), postherpetic neuralgia, type 1 RTA, chronic lower extremity edema, venous insufficiency.    She presents for  1) She had a lip abscess recently and was placed on Doxycycline x 7 days but only took 3-4 days and has been using Naproxen, warm compresses, ice and her lip has decreased in size. She has f/u with the dentist soon.  She denies being bitten by a bug or what could have caused abscess to right upper lip 2)She does not need refills on medications  3) Chronic back pain (lower) due to bulging disk she is s/p epidural 06/2013. She has f/u with Dr. Letta Pate 6/25 but is on the cancellation list for earlier appt. Back pain is daily.  She takes Tramadol with some but not total relief and states she does not want stronger narcotics.     Health maintenance: She had colonoscopy with Dr. Deatra Ina and is not due for colonoscopy currently until 2017. 07/2013 pap smear negative      Review of Systems  HENT:       +lip abscess with decreased swelling   Respiratory: Negative for shortness of breath.   Cardiovascular: Negative for chest pain and leg swelling.  Gastrointestinal: Negative for abdominal pain and constipation.       Objective:   Physical Exam  Nursing note and vitals reviewed. Constitutional: She is oriented to person, place, and time. She appears  well-developed and well-nourished. She is cooperative. No distress.  HENT:  Head: Normocephalic and atraumatic.  Mouth/Throat: Oropharynx is clear and moist. No oropharyngeal exudate.    Eyes: Conjunctivae are normal. Pupils are equal, round, and reactive to light. Right eye exhibits no discharge. Left eye exhibits no discharge. No scleral icterus.  Cardiovascular: Normal rate, regular rhythm, S1 normal, S2 normal and normal heart sounds.   No murmur heard. No lower ext edema   Pulmonary/Chest: Effort normal and breath sounds normal. No respiratory distress. She has no wheezes.  Abdominal: Soft. Bowel sounds are normal. There is no tenderness.  Musculoskeletal:  Mild to mod ttp lower back   Neurological: She is alert and oriented to person, place, and time. Gait normal.  Skin: Skin is warm, dry and intact. No rash noted. She is not diaphoretic.  Psychiatric: She has a normal mood and affect. Her speech is normal and behavior is normal. Judgment and thought content normal. Cognition and memory are normal.          Assessment & Plan:  F/u in 3-6 months

## 2013-08-29 NOTE — Assessment & Plan Note (Signed)
Repeat lipid 10/2013

## 2013-08-29 NOTE — Assessment & Plan Note (Signed)
Will follow with Dr. Letta Pate 09/2013  No refills of Ultram needed

## 2013-08-29 NOTE — Assessment & Plan Note (Signed)
BP Readings from Last 3 Encounters:  08/29/13 143/94  07/18/13 143/72  05/21/13 162/90    Lab Results  Component Value Date   NA 139 05/09/2013   K 3.9 05/09/2013   CREATININE 0.63 05/09/2013    Assessment: Blood pressure control: mildly elevated Progress toward BP goal:   (slightly above goal ) Comments: none  Plan: Medications:  continue current medications (Norvasc 10) Educational resources provided: brochure;other (see comments) Self management tools provided: home blood pressure logbook;other (see comments) Other plans: f/u in 3-6 months

## 2013-08-29 NOTE — Assessment & Plan Note (Signed)
As of 06/2013 neg viral load

## 2013-08-29 NOTE — Assessment & Plan Note (Signed)
Improving but still present  Complete Abx (Doxy) until course complete  Continue warm compresses  RTC if not resolving  She will also follow with dentist as well

## 2013-08-29 NOTE — Progress Notes (Signed)
Attending physician note: Presenting complaints, physical findings, and medications reviewed with resident physician Dr. [name] and I concur with her management. Murriel Hopper, M.D., [place]

## 2013-08-29 NOTE — Assessment & Plan Note (Signed)
Controlled follows with ID [id number]

## 2013-08-29 NOTE — Patient Instructions (Addendum)
General Instructions: Please follow up in 3-6 months, sooner if needed  Read the information bellow Keep using a warm compress to your lip as many times a day as possible and finish the antibiotic.   Take care  Treatment Goals:  Goals (1 Years of Data) as of 08/29/13         As of Today 07/18/13 05/21/13 05/02/13 03/12/13     Blood Pressure    . Blood Pressure < 140/90  143/94 143/72 162/90 137/88 130/77      Progress Toward Treatment Goals:  Treatment Goal 08/29/2013  Blood pressure (No Data)    Self Care Goals & Plans:  Self Care Goal 08/29/2013  Manage my medications take my medicines as prescribed; bring my medications to every visit; refill my medications on time  Monitor my health keep track of my blood pressure; keep track of my weight  Eat healthy foods drink diet soda or water instead of juice or soda; eat more vegetables; eat foods that are low in salt; eat baked foods instead of fried foods; eat fruit for snacks and desserts; eat smaller portions  Be physically active find an activity I enjoy  Meeting treatment goals maintain the current self-care plan    No flowsheet data found.   Care Management & Community Referrals:  Referral 08/29/2013  Referrals made for care management support none needed  Referrals made to community resources none       Exercise to Lose Weight Exercise and a healthy diet may help you lose weight. Your doctor may suggest specific exercises. EXERCISE IDEAS AND TIPS  Choose low-cost things you enjoy doing, such as walking, bicycling, or exercising to workout videos.  Take stairs instead of the elevator.  Walk during your lunch break.  Park your car further away from work or school.  Go to a gym or an exercise class.  Start with 5 to 10 minutes of exercise each day. Build up to 30 minutes of exercise 4 to 6 days a week.  Wear shoes with good support and comfortable clothes.  Stretch before and after working out.  Work out until you  breathe harder and your heart beats faster.  Drink extra water when you exercise.  Do not do so much that you hurt yourself, feel dizzy, or get very short of breath. Exercises that burn about 150 calories:  Running 1  miles in 15 minutes.  Playing volleyball for 45 to 60 minutes.  Washing and waxing a car for 45 to 60 minutes.  Playing touch football for 45 minutes.  Walking 1  miles in 35 minutes.  Pushing a stroller 1  miles in 30 minutes.  Playing basketball for 30 minutes.  Raking leaves for 30 minutes.  Bicycling 5 miles in 30 minutes.  Walking 2 miles in 30 minutes.  Dancing for 30 minutes.  Shoveling snow for 15 minutes.  Swimming laps for 20 minutes.  Walking up stairs for 15 minutes.  Bicycling 4 miles in 15 minutes.  Gardening for 30 to 45 minutes.  Jumping rope for 15 minutes.  Washing windows or floors for 45 to 60 minutes. Document Released: 05/14/2010 Document Revised: 07/04/2011 Document Reviewed: 05/14/2010 [facility] Patient Information 2014 [place], [place].  DASH Diet The DASH diet stands for "Dietary Approaches to Stop Hypertension." It is a healthy eating plan that has been shown to reduce high blood pressure (hypertension) in as little as 14 days, while also possibly providing other significant health benefits. These other health benefits include  reducing the risk of breast cancer after menopause and reducing the risk of type 2 diabetes, heart disease, colon cancer, and stroke. Health benefits also include weight loss and slowing kidney failure in patients with chronic kidney disease.  DIET GUIDELINES  Limit salt (sodium). Your diet should contain less than 1500 mg of sodium daily.  Limit refined or processed carbohydrates. Your diet should include mostly whole grains. Desserts and added sugars should be used sparingly.  Include small amounts of heart-healthy fats. These types of fats include nuts, oils, and tub margarine. Limit saturated and  trans fats. These fats have been shown to be harmful in the body. CHOOSING FOODS  The following food groups are based on a 2000 calorie diet. See your Registered Dietitian for individual calorie needs. Grains and Grain Products (6 to 8 servings daily)  Eat More Often: Whole-wheat bread, brown rice, whole-grain or wheat pasta, quinoa, popcorn without added fat or salt (air popped).  Eat Less Often: White bread, white pasta, white rice, cornbread. Vegetables (4 to 5 servings daily)  Eat More Often: Fresh, frozen, and canned vegetables. Vegetables may be raw, steamed, roasted, or grilled with a minimal amount of fat.  Eat Less Often/Avoid: Creamed or fried vegetables. Vegetables in a cheese sauce. Fruit (4 to 5 servings daily)  Eat More Often: All fresh, canned (in natural juice), or frozen fruits. Dried fruits without added sugar. One hundred percent fruit juice ( cup [237 mL] daily).  Eat Less Often: Dried fruits with added sugar. Canned fruit in light or heavy syrup. [employer], Fish, and Poultry (2 servings or less daily. One serving is 3 to 4 oz [85-114 g]).  Eat More Often: Ninety percent or leaner ground beef, tenderloin, sirloin. Round cuts of beef, chicken breast, [place] breast. All fish. Grill, bake, or broil your meat. Nothing should be fried.  Eat Less Often/Avoid: Fatty cuts of meat, [place], or chicken leg, thigh, or wing. Fried cuts of meat or fish. Dairy (2 to 3 servings)  Eat More Often: Low-fat or fat-free milk, low-fat plain or light yogurt, reduced-fat or part-skim cheese.  Eat Less Often/Avoid: Milk (whole, 2%).Whole milk yogurt. Full-fat cheeses. Nuts, Seeds, and Legumes (4 to 5 servings per week)  Eat More Often: All without added salt.  Eat Less Often/Avoid: Salted nuts and seeds, canned beans with added salt. Fats and Sweets (limited)  Eat More Often: Vegetable oils, tub margarines without trans fats, sugar-free gelatin. Mayonnaise and salad dressings.  Eat  Less Often/Avoid: Coconut oils, palm oils, butter, stick margarine, cream, half and half, cookies, candy, pie. FOR MORE INFORMATION The Dash Diet Eating Plan: www.dashdiet.org Document Released: 03/31/2011 Document Revised: 07/04/2011 Document Reviewed: 03/31/2011 [facility] Patient Information 2014 [place], [place].  Hypertension As your heart beats, it forces blood through your arteries. This force is your blood pressure. If the pressure is too high, it is called hypertension (HTN) or high blood pressure. HTN is dangerous because you may have it and not know it. High blood pressure may mean that your heart has to work harder to pump blood. Your arteries may be narrow or stiff. The extra work puts you at risk for heart disease, stroke, and other problems.  Blood pressure consists of two numbers, a higher number over a lower, 110/72, for example. It is stated as "110 over 72." The ideal is below 120 for the top number (systolic) and under 80 for the bottom (diastolic). Write down your blood pressure today. You should pay close attention to your  blood pressure if you have certain conditions such as:  Heart failure.  Prior heart attack.  Diabetes  Chronic kidney disease.  Prior stroke.  Multiple risk factors for heart disease. To see if you have HTN, your blood pressure should be measured while you are seated with your arm held at the level of the heart. It should be measured at least twice. A one-time elevated blood pressure reading (especially in the Emergency Department) does not mean that you need treatment. There may be conditions in which the blood pressure is different between your right and left arms. It is important to see your caregiver soon for a recheck. Most people have essential hypertension which means that there is not a specific cause. This type of high blood pressure may be lowered by changing lifestyle factors such as:  Stress.  Smoking.  Lack of exercise.  Excessive  weight.  Drug/tobacco/alcohol use.  Eating less salt. Most people do not have symptoms from high blood pressure until it has caused damage to the body. Effective treatment can often prevent, delay or reduce that damage. TREATMENT  When a cause has been identified, treatment for high blood pressure is directed at the cause. There are a large number of medications to treat HTN. These fall into several categories, and your caregiver will help you select the medicines that are best for you. Medications may have side effects. You should review side effects with your caregiver. If your blood pressure stays high after you have made lifestyle changes or started on medicines,   Your medication(s) may need to be changed.  Other problems may need to be addressed.  Be certain you understand your prescriptions, and know how and when to take your medicine.  Be sure to follow up with your caregiver within the time frame advised (usually within two weeks) to have your blood pressure rechecked and to review your medications.  If you are taking more than one medicine to lower your blood pressure, make sure you know how and at what times they should be taken. Taking two medicines at the same time can result in blood pressure that is too low. SEEK IMMEDIATE MEDICAL CARE IF:  You develop a severe headache, blurred or changing vision, or confusion.  You have unusual weakness or numbness, or a faint feeling.  You have severe chest or abdominal pain, vomiting, or breathing problems. MAKE SURE YOU:   Understand these instructions.  Will watch your condition.  Will get help right away if you are not doing well or get worse. Document Released: 04/11/2005 Document Revised: 07/04/2011 Document Reviewed: 11/30/2007 [facility] Patient Information 2014 [place]. Abscess An abscess is an infected area that contains a collection of pus and debris.It can occur in almost any part of the body. An abscess is  also known as a furuncle or boil. CAUSES  An abscess occurs when tissue gets infected. This can occur from blockage of oil or sweat glands, infection of hair follicles, or a minor injury to the skin. As the body tries to fight the infection, pus collects in the area and creates pressure under the skin. This pressure causes pain. People with weakened immune systems have difficulty fighting infections and get certain abscesses more often.  SYMPTOMS Usually an abscess develops on the skin and becomes a painful mass that is red, warm, and tender. If the abscess forms under the skin, you may feel a moveable soft area under the skin. Some abscesses break open (rupture) on their own, but  most will continue to get worse without care. The infection can spread deeper into the body and eventually into the bloodstream, causing you to feel ill.  DIAGNOSIS  Your caregiver will take your medical history and perform a physical exam. A sample of fluid may also be taken from the abscess to determine what is causing your infection. TREATMENT  Your caregiver may prescribe antibiotic medicines to fight the infection. However, taking antibiotics alone usually does not cure an abscess. Your caregiver may need to make a small cut (incision) in the abscess to drain the pus. In some cases, gauze is packed into the abscess to reduce pain and to continue draining the area. HOME CARE INSTRUCTIONS   Only take over-the-counter or prescription medicines for pain, discomfort, or fever as directed by your caregiver.  If you were prescribed antibiotics, take them as directed. Finish them even if you start to feel better.  If gauze is used, follow your caregiver's directions for changing the gauze.  To avoid spreading the infection:  Keep your draining abscess covered with a bandage.  Wash your hands well.  Do not share personal care items, towels, or whirlpools with others.  Avoid skin contact with others.  Keep your skin  and clothes clean around the abscess.  Keep all follow-up appointments as directed by your caregiver. SEEK MEDICAL CARE IF:   You have increased pain, swelling, redness, fluid drainage, or bleeding.  You have muscle aches, chills, or a general ill feeling.  You have a fever. MAKE SURE YOU:   Understand these instructions.  Will watch your condition.  Will get help right away if you are not doing well or get worse. Document Released: 01/19/2005 Document Revised: 10/11/2011 Document Reviewed: 06/24/2011 [facility] Patient Information 2014 [place].

## 2013-09-04 ENCOUNTER — Other Ambulatory Visit: Payer: Self-pay | Admitting: Internal Medicine

## 2013-10-03 ENCOUNTER — Other Ambulatory Visit: Payer: Self-pay | Admitting: Internal Medicine

## 2013-10-03 ENCOUNTER — Other Ambulatory Visit: Payer: Self-pay | Admitting: Infectious Diseases

## 2013-10-17 ENCOUNTER — Encounter: Payer: Medicaid Other | Attending: Physical Medicine & Rehabilitation

## 2013-10-17 ENCOUNTER — Other Ambulatory Visit: Payer: Self-pay

## 2013-10-17 ENCOUNTER — Ambulatory Visit (HOSPITAL_BASED_OUTPATIENT_CLINIC_OR_DEPARTMENT_OTHER): Payer: Medicaid Other | Admitting: Physical Medicine & Rehabilitation

## 2013-10-17 ENCOUNTER — Encounter: Payer: Self-pay | Admitting: Physical Medicine & Rehabilitation

## 2013-10-17 VITALS — BP 148/91 | HR 74 | Resp 14 | Ht 61.0 in | Wt 150.0 lb

## 2013-10-17 DIAGNOSIS — M47817 Spondylosis without myelopathy or radiculopathy, lumbosacral region: Secondary | ICD-10-CM

## 2013-10-17 DIAGNOSIS — M545 Low back pain, unspecified: Secondary | ICD-10-CM | POA: Insufficient documentation

## 2013-10-17 NOTE — Patient Instructions (Signed)

## 2013-10-17 NOTE — Progress Notes (Signed)
Bilateral Lumbar L3, L4  medial branch blocks and L 5 dorsal ramus injection under fluoroscopic guidance  Indication: Lumbar pain which is not relieved by medication management or other conservative care and interfering with self-care and mobility. Prior procedure performed in March 2015 lasted 1 months. Pain has now occurred.  Last CD 4 count Jan 2015 was 800 with 26% T helper cells, improved vs prior result  Informed consent was obtained after describing risks and benefits of the procedure with the patient, this includes bleeding, infection, paralysis and medication side effects.  The patient wishes to proceed and has given written consent. The patient has well-controlled HIV. Reviewed most recent lab work. The patient was placed in prone position.  The lumbar area was marked and prepped with Betadine.  One mL of 1% lidocaine was injected into each of 6 areas into the skin and subcutaneous tissue.  Then a 22-gauge 3.5 spinal needle was inserted targeting the junction of the left S1 superior articular process and sacral ala junction. Needle was advanced under fluoroscopic guidance.  Bone contact was made.  Omnipaque 180 was injected x 0.5 mL demonstrating no intravascular uptake.  Then a solution containing one mL of 4 mg per mL dexamethasone and 3 mL of 2% MPF lidocaine was injected x 0.5 mL.  Then the left L5 superior articular process in transverse process junction was targeted.  Bone contact was made.  Omnipaque 180 was injected x 0.5 mL demonstrating no intravascular uptake. Then a solution containing one mL of 4 mg per mL dexamethasone and 3 mL of 2% MPF lidocaine was injected x 0.5 mL.  Then the left L4 superior articular process in transverse process junction was targeted.  Bone contact was made.  Omnipaque 180 was injected x 0.5 mL demonstrating no intravascular uptake.  Then a solution containing one mL of 4 mg per mL dexamethasone and 3 mL if 2% MPF lidocaine was injected x 0.5 mL.  This same  procedure was performed on the right side using the same needle, technique and injectate.  Patient tolerated procedure well.  Post procedure instructions were given.  RTC

## 2013-10-17 NOTE — Progress Notes (Signed)
  [facility]   Name: [name] [name] DOB:04/12/1961 MRN: [id number]  Date:10/17/2013  Physician: Alysia Penna, MD    Nurse/CMA: Wafa Martes CMA  Allergies:  Allergies  Allergen Reactions  . Acetaminophen Other (See Comments)    Inflamed liver, hospitalized REACTION: Had liver problems, was hospitalized  . Dyazide [Hydrochlorothiazide W-Triamterene] Hives  . Lisinopril     Cough   . Triamterene Hives  . Morphine Rash and Hives    REACTION: rash    Consent Signed: yes  Is patient diabetic? no  CBG today?   Pregnant: no LMP: No LMP recorded. Patient is postmenopausal. (age 76-55)  Anticoagulants: no Anti-inflammatory: no Antibiotics: no  Procedure: Bilateral L3-4-5 Medial Branch Block Position: Prone Start Time: 1103 End Time: 1115 Fluoro Time: 41 seconds  RN/CMA Mahlet Jergens CMA Benicia Bergevin CMA    Time 1050 1117    BP 148/91 152/86    Pulse 74 91    Respirations 14 14    O2 Sat 97 98    S/S 6 6    Pain Level 10/10 6/10     D/C home with [name], [name] & O X 3, D/C instructions reviewed, and sits independently.

## 2013-10-28 ENCOUNTER — Telehealth: Payer: Self-pay

## 2013-10-28 NOTE — Telephone Encounter (Signed)
Patient states the back injection she was given on 6/25 did not help with the pain. Patient is wanting to know about other options at this point.

## 2013-11-04 ENCOUNTER — Other Ambulatory Visit: Payer: Medicaid Other

## 2013-11-04 NOTE — Telephone Encounter (Signed)
Patient aware of RF recommendation.  Appointment made.

## 2013-11-04 NOTE — Telephone Encounter (Signed)
Had short term relief on several occasions, Schedule for L3,4,5 RF, Right

## 2013-11-05 ENCOUNTER — Other Ambulatory Visit: Payer: Medicaid Other

## 2013-11-05 DIAGNOSIS — Z79899 Other long term (current) drug therapy: Secondary | ICD-10-CM

## 2013-11-05 DIAGNOSIS — B20 Human immunodeficiency virus [HIV] disease: Secondary | ICD-10-CM

## 2013-11-05 DIAGNOSIS — Z113 Encounter for screening for infections with a predominantly sexual mode of transmission: Secondary | ICD-10-CM

## 2013-11-05 LAB — CBC WITH DIFFERENTIAL/PLATELET
Basophils Absolute: 0 10*3/uL (ref 0.0–0.1)
Basophils Relative: 0 % (ref 0–1)
EOS ABS: 0.3 10*3/uL (ref 0.0–0.7)
Eosinophils Relative: 3 % (ref 0–5)
HEMATOCRIT: 40.8 % (ref 36.0–46.0)
HEMOGLOBIN: 14 g/dL (ref 12.0–15.0)
LYMPHS ABS: 3.9 10*3/uL (ref 0.7–4.0)
LYMPHS PCT: 40 % (ref 12–46)
MCH: 26.7 pg (ref 26.0–34.0)
MCHC: 34.3 g/dL (ref 30.0–36.0)
MCV: 77.9 fL — ABNORMAL LOW (ref 78.0–100.0)
MONO ABS: 0.6 10*3/uL (ref 0.1–1.0)
MONOS PCT: 6 % (ref 3–12)
Neutro Abs: 4.9 10*3/uL (ref 1.7–7.7)
Neutrophils Relative %: 51 % (ref 43–77)
Platelets: 266 10*3/uL (ref 150–400)
RBC: 5.24 MIL/uL — AB (ref 3.87–5.11)
RDW: 16.6 % — ABNORMAL HIGH (ref 11.5–15.5)
WBC: 9.7 10*3/uL (ref 4.0–10.5)

## 2013-11-06 LAB — T-HELPER CELL (CD4) - (RCID CLINIC ONLY)
CD4 % Helper T Cell: 22 % — ABNORMAL LOW (ref 33–55)
CD4 T CELL ABS: 900 /uL (ref 400–2700)

## 2013-11-06 LAB — COMPLETE METABOLIC PANEL WITH GFR
ALT: 16 U/L (ref 0–35)
AST: 16 U/L (ref 0–37)
Albumin: 4.6 g/dL (ref 3.5–5.2)
Alkaline Phosphatase: 147 U/L — ABNORMAL HIGH (ref 39–117)
BILIRUBIN TOTAL: 0.4 mg/dL (ref 0.2–1.2)
BUN: 11 mg/dL (ref 6–23)
CALCIUM: 9.4 mg/dL (ref 8.4–10.5)
CHLORIDE: 105 meq/L (ref 96–112)
CO2: 23 meq/L (ref 19–32)
CREATININE: 0.69 mg/dL (ref 0.50–1.10)
GLUCOSE: 85 mg/dL (ref 70–99)
Potassium: 4 mEq/L (ref 3.5–5.3)
Sodium: 139 mEq/L (ref 135–145)
Total Protein: 7.7 g/dL (ref 6.0–8.3)

## 2013-11-06 LAB — LIPID PANEL
CHOL/HDL RATIO: 4.9 ratio
Cholesterol: 204 mg/dL — ABNORMAL HIGH (ref 0–200)
HDL: 42 mg/dL (ref >39)
LDL Cholesterol: 127 mg/dL — ABNORMAL HIGH (ref 0–99)
Triglycerides: 175 mg/dL — ABNORMAL HIGH (ref <150)
VLDL: 35 mg/dL (ref 0–40)

## 2013-11-06 LAB — RPR

## 2013-11-07 LAB — HIV-1 RNA QUANT-NO REFLEX-BLD
HIV 1 RNA Quant: <20 copies/mL (ref <20)
HIV-1 RNA Quant, Log: <1.3 {Log} (ref <1.30)

## 2013-11-14 ENCOUNTER — Ambulatory Visit: Payer: Medicaid Other | Admitting: Physical Medicine & Rehabilitation

## 2013-11-18 ENCOUNTER — Encounter: Payer: Self-pay | Admitting: Infectious Diseases

## 2013-11-18 ENCOUNTER — Ambulatory Visit (INDEPENDENT_AMBULATORY_CARE_PROVIDER_SITE_OTHER): Payer: Medicaid Other | Admitting: Infectious Diseases

## 2013-11-18 ENCOUNTER — Other Ambulatory Visit: Payer: Medicaid Other

## 2013-11-18 VITALS — BP 135/90 | HR 97 | Temp 98.3°F | Wt 151.0 lb

## 2013-11-18 DIAGNOSIS — B1710 Acute hepatitis C without hepatic coma: Secondary | ICD-10-CM

## 2013-11-18 DIAGNOSIS — Z531 Procedure and treatment not carried out because of patient's decision for reasons of belief and group pressure: Secondary | ICD-10-CM | POA: Insufficient documentation

## 2013-11-18 DIAGNOSIS — B20 Human immunodeficiency virus [HIV] disease: Secondary | ICD-10-CM

## 2013-11-18 DIAGNOSIS — E785 Hyperlipidemia, unspecified: Secondary | ICD-10-CM

## 2013-11-18 DIAGNOSIS — Z23 Encounter for immunization: Secondary | ICD-10-CM

## 2013-11-18 MED ORDER — ATAZANAVIR-COBICISTAT 300-150 MG PO TABS: 1.0000 | ORAL_TABLET | Freq: Every day | ORAL | Status: DC

## 2013-11-18 NOTE — Assessment & Plan Note (Signed)
Appreciate Dr Joycelyn Schmid help.

## 2013-11-18 NOTE — Assessment & Plan Note (Signed)
Will change her to ATVc/TRV. Will see her back in 3-4 months. Will restart hep B series. Given condoms. Had PAP in April.

## 2013-11-18 NOTE — Assessment & Plan Note (Signed)
She is off statin. Encouraged her to watch diet, exercise.

## 2013-11-18 NOTE — Addendum Note (Signed)
Addended by: Landis Gandy on: 11/18/2013 12:24 PM   Modules accepted: Orders, Medications

## 2013-11-18 NOTE — Progress Notes (Signed)
   Subjective:    [name], female    DOB: November 12, 1960, 53 y.o.   MRN: [id number]  HPI 53 yo F with hx of Hep C (tx at [facility] completed 04-2013, repeat Hep C RNA -) and HIV+ (LXVr/TRV). She had nasal septoplasty 01-05-10. She was previously changed to D'Lo in 2013. She was then changed to TRV/DTGV in anticipation of starting Hep C treatment. She developed a rash on this and was then changed to ISN/TRV (which she takes with hydroxizine due to pruritis, rash).  Has been seeing PMR and has gotten lumbar injections in her back. Not improving. Now going to have nerves "deaden with heat". Is going to start water aerobics due to wt gain.   HIV 1 RNA Quant (copies/mL)  Date Value  11/05/2013 <20   05/09/2013 <20   12/26/2012 <20      CD4 T Cell Abs (/uL)  Date Value  11/05/2013 900   05/09/2013 800   12/26/2012 730    PT IS JEHOVAH'S WITNESS  Review of Systems  Constitutional: Negative for appetite change and unexpected weight change.  Gastrointestinal: Negative for diarrhea and constipation.  Genitourinary: Negative for difficulty urinating.      Objective:   Physical Exam  Constitutional: She appears well-developed and well-nourished.  HENT:  Mouth/Throat: No oropharyngeal exudate.  Eyes: EOM are normal. Pupils are equal, round, and reactive to light.  Neck: Neck supple.  Cardiovascular: Normal rate, regular rhythm and normal heart sounds.   Lymphadenopathy:    She has no cervical adenopathy.  Skin: Rash noted.  Faint rash on her L neck. Papules. Erythema.           Assessment & Plan:

## 2013-11-20 ENCOUNTER — Telehealth: Payer: Self-pay | Admitting: *Deleted

## 2013-11-20 ENCOUNTER — Other Ambulatory Visit: Payer: Self-pay | Admitting: *Deleted

## 2013-11-20 MED ORDER — PANTOPRAZOLE SODIUM 20 MG PO TBEC: 20.0000 mg | DELAYED_RELEASE_TABLET | Freq: Every day | ORAL | Status: DC

## 2013-11-20 NOTE — Telephone Encounter (Signed)
Please space by 12hours thanks

## 2013-11-20 NOTE — Telephone Encounter (Signed)
Call from patient's pharmacy, [facility] questioning an interaction between the pantoprazole 20 mg twice daily (which is prescribed by another provider) and the [place]. Please advise [name] [name]

## 2013-11-20 NOTE — Telephone Encounter (Signed)
Per Dr. Johnnye Sima pantoprazole should be changed to once daily and spaced 12 hours apart from the [facility]. Patient and pharmacist notified.

## 2013-11-22 ENCOUNTER — Ambulatory Visit: Payer: Medicaid Other

## 2013-11-26 ENCOUNTER — Other Ambulatory Visit: Payer: Self-pay | Admitting: Licensed Clinical Social Worker

## 2013-11-26 DIAGNOSIS — Z21 Asymptomatic human immunodeficiency virus [HIV] infection status: Secondary | ICD-10-CM

## 2013-11-26 DIAGNOSIS — B20 Human immunodeficiency virus [HIV] disease: Secondary | ICD-10-CM

## 2013-11-26 MED ORDER — EMTRICITABINE-TENOFOVIR DF 200-300 MG PO TABS: 1.0000 | ORAL_TABLET | Freq: Every day | ORAL | Status: DC

## 2013-11-26 MED ORDER — ATAZANAVIR-COBICISTAT 300-150 MG PO TABS: 1.0000 | ORAL_TABLET | Freq: Every day | ORAL | Status: DC

## 2013-11-28 ENCOUNTER — Encounter: Payer: Self-pay | Admitting: Physical Medicine & Rehabilitation

## 2013-11-30 ENCOUNTER — Other Ambulatory Visit: Payer: Self-pay | Admitting: Internal Medicine

## 2013-12-05 ENCOUNTER — Ambulatory Visit: Payer: Medicaid Other | Admitting: Physical Medicine & Rehabilitation

## 2013-12-12 ENCOUNTER — Encounter: Payer: Medicaid Other | Attending: Physical Medicine & Rehabilitation

## 2013-12-12 ENCOUNTER — Ambulatory Visit (HOSPITAL_BASED_OUTPATIENT_CLINIC_OR_DEPARTMENT_OTHER): Payer: Medicaid Other | Admitting: Physical Medicine & Rehabilitation

## 2013-12-12 ENCOUNTER — Encounter: Payer: Self-pay | Admitting: Physical Medicine & Rehabilitation

## 2013-12-12 ENCOUNTER — Ambulatory Visit: Payer: Medicaid Other | Admitting: Infectious Diseases

## 2013-12-12 VITALS — BP 153/83 | HR 96 | Resp 14 | Wt 153.2 lb

## 2013-12-12 DIAGNOSIS — M47817 Spondylosis without myelopathy or radiculopathy, lumbosacral region: Secondary | ICD-10-CM | POA: Insufficient documentation

## 2013-12-12 DIAGNOSIS — M545 Low back pain, unspecified: Secondary | ICD-10-CM | POA: Diagnosis present

## 2013-12-12 MED ORDER — GABAPENTIN 600 MG PO TABS: 600.0000 mg | ORAL_TABLET | Freq: Three times a day (TID) | ORAL | Status: DC

## 2013-12-12 NOTE — Progress Notes (Signed)
Left L5 dorsal ramus., left L4 and left L3 medial branch radio frequency neuropathy under fluoroscopic guidance  Indication: Low back pain due to lumbar spondylosis which has been relieved on 2 occasions by greater than 50% by lumbar medial branch blocks at corresponding levels.  Informed consent was obtained after describing risks and benefits of the procedure with the patient, this includes bleeding, bruising, infection, paralysis and medication side effects. The patient wishes to proceed and has given written consent. The patient was placed in a prone position. The lumbar and sacral area was marked and prepped with Betadine. A 25-gauge 1-1/2 inch needle was inserted into the skin and subcutaneous tissue at 3 sites in one ML of 1% lidocaine was injected into each site. Then a 20-gauge 15cm cm radio frequency needle with a 1 cm curved active tip was inserted targeting the left S1 SAP/sacral ala junction. Bone contact was made and confirmed with lateral imaging. Sensory stimulation at 50 Hz followed by motor stimulation at 2 Hz confirm proper needle location followed by injection of one ML of the solution containing one ML of 4 mg per mL dexamethasone and 3 mL of 1% MPF lidocaine. Then the left L5 SAP/transverse process junction was targeted. Bone contact was made and confirmed with lateral imaging. Sensory stimulation at 50 Hz followed by motor stimulation at 2 Hz confirm proper needle location followed by injection of one ML of the solution containing one ML of 4 mg per mL dexamethasone and 3 mL of 1% MPF lidocaine. Then the left L4 SAP/transverse process junction was targeted. Bone contact was made and confirmed with lateral imaging. Sensory stimulation at 50 Hz followed by motor stimulation at 2 Hz confirm proper needle location followed by injection of one ML of the solution containing one ML of 4 mg per mL dexamethasone and 3 mL of 1% MPF lidocaine. Radio frequency lesion being at [facility] for 90 seconds was  performed. Needles were removed. Post procedure instructions and vital signs were performed. Patient tolerated procedure well. Followup appointment was given.

## 2013-12-12 NOTE — Patient Instructions (Signed)
You had a radio frequency procedure today This was done to alleviate joint pain in your lumbar area We injected a combination of dexamethasone which is a steroid as well as lidocaine which is a local anesthetic. Dexamethasone made increased blood sugars you are diabetic You may experience soreness at the injection sites. You may also experienced some irritation of the nerves that were heated I'm recommending ice for 30 minutes every 2 hours as needed for the next 24-48 hours In addition he will be taking gabapentin 600 mg 3 times a day today only if this is not one of your usual medicines  

## 2013-12-12 NOTE — Progress Notes (Signed)
  PROCEDURE RECORD [facility]   Name: [name] [name] DOB:[birth year] MRN: [id number]  Date:12/12/2013  Physician: Alysia Penna, MD    Nurse/CMA: Katharina Jehle RN  Allergies:  Allergies  Allergen Reactions  . Acetaminophen Other (See Comments)    Inflamed liver, hospitalized REACTION: Had liver problems, was hospitalized  . Dyazide [Hydrochlorothiazide W-Triamterene] Hives  . Lisinopril     Cough   . Triamterene Hives  . Morphine Rash and Hives    REACTION: rash    Consent Signed: Yes.    Is patient diabetic? No.  CBG today?  Pregnant: No. LMP: No LMP recorded. Patient is postmenopausal. (age 63-55)  Anticoagulants: no Anti-inflammatory: no Antibiotics: no  Procedure: Left L 3-4-5 Radiofrequency Neurotomy Position: Prone Start Time:9:27 End Time: 945 Fluoro Time: 25 seconds  RN/CMA Health and safety inspector RN    Time 8:55 947    BP 153/83 146/80    Pulse 96 100    Respirations 14 14    O2 Sat 100 100    S/S 6 6    Pain Level 9/10 Unable to tell     D/C home with Kendal Hymen, [name] & O X 3, D/C instructions reviewed, and sits independently.

## 2013-12-13 ENCOUNTER — Ambulatory Visit: Payer: Medicaid Other

## 2013-12-13 ENCOUNTER — Encounter: Payer: Self-pay | Admitting: Physical Medicine & Rehabilitation

## 2013-12-19 ENCOUNTER — Ambulatory Visit (INDEPENDENT_AMBULATORY_CARE_PROVIDER_SITE_OTHER): Payer: Medicaid Other | Admitting: Licensed Clinical Social Worker

## 2013-12-19 DIAGNOSIS — Z23 Encounter for immunization: Secondary | ICD-10-CM

## 2013-12-19 DIAGNOSIS — B182 Chronic viral hepatitis C: Secondary | ICD-10-CM

## 2013-12-29 ENCOUNTER — Other Ambulatory Visit: Payer: Self-pay | Admitting: Internal Medicine

## 2014-01-01 NOTE — Telephone Encounter (Signed)
Called to pharm 

## 2014-01-02 ENCOUNTER — Telehealth: Payer: Self-pay | Admitting: *Deleted

## 2014-01-02 NOTE — Telephone Encounter (Signed)
Prescription for Tramadol 50 mg tablets #90 called to [facility].  Directions take 2 tablets by mouth every 4-6 hours as needed for pain: Must last for 30 days with 2 refills per order of Dr. Aundra Dubin.   Sander Nephew, RN 01/02/2014 9:12 AM.

## 2014-01-14 ENCOUNTER — Telehealth: Payer: Self-pay | Admitting: *Deleted

## 2014-01-14 NOTE — Telephone Encounter (Signed)
Pt started Evotaz and now has itchy rash on back of her neck similar to rash that she had when taking Isentress.  Requesting appt.  Dr. Algis Downs first available is 02/03/14.  Pt made this appt.  MD please notify if pt should stop the Evotaz and Truvada until she can be seen on 02/03/14.

## 2014-01-15 ENCOUNTER — Other Ambulatory Visit: Payer: Self-pay | Admitting: Internal Medicine

## 2014-01-15 DIAGNOSIS — H9390 Unspecified disorder of ear, unspecified ear: Secondary | ICD-10-CM

## 2014-01-15 NOTE — Telephone Encounter (Signed)
Patient notified

## 2014-01-15 NOTE — Telephone Encounter (Signed)
Please hold ART til visit 10-12

## 2014-01-21 ENCOUNTER — Encounter: Payer: Self-pay | Admitting: Physical Medicine & Rehabilitation

## 2014-01-21 ENCOUNTER — Other Ambulatory Visit: Payer: Self-pay | Admitting: Internal Medicine

## 2014-01-21 ENCOUNTER — Other Ambulatory Visit: Payer: Self-pay | Admitting: Infectious Diseases

## 2014-01-21 ENCOUNTER — Encounter: Payer: Medicaid Other | Attending: Physical Medicine & Rehabilitation

## 2014-01-21 ENCOUNTER — Other Ambulatory Visit: Payer: Self-pay | Admitting: Licensed Clinical Social Worker

## 2014-01-21 ENCOUNTER — Ambulatory Visit (HOSPITAL_BASED_OUTPATIENT_CLINIC_OR_DEPARTMENT_OTHER): Payer: Medicaid Other | Admitting: Physical Medicine & Rehabilitation

## 2014-01-21 VITALS — BP 133/85 | HR 90 | Resp 14 | Ht 61.0 in | Wt 154.0 lb

## 2014-01-21 DIAGNOSIS — M545 Low back pain, unspecified: Secondary | ICD-10-CM | POA: Diagnosis not present

## 2014-01-21 DIAGNOSIS — B20 Human immunodeficiency virus [HIV] disease: Secondary | ICD-10-CM

## 2014-01-21 DIAGNOSIS — M47817 Spondylosis without myelopathy or radiculopathy, lumbosacral region: Secondary | ICD-10-CM

## 2014-01-21 MED ORDER — ATAZANAVIR-COBICISTAT 300-150 MG PO TABS: 1.0000 | ORAL_TABLET | Freq: Every day | ORAL | Status: DC

## 2014-01-21 MED ORDER — EMTRICITABINE-TENOFOVIR DF 200-300 MG PO TABS: 1.0000 | ORAL_TABLET | Freq: Every day | ORAL | Status: DC

## 2014-01-21 NOTE — Progress Notes (Signed)
   Subjective:    [name], female    DOB: Oct 08, 1960, 53 y.o.   MRN: [id number]  HPI Pain Inventory Average Pain 4 Pain Right Now 8 My pain is intermittent and aching  In the last 24 hours, has pain interfered with the following? General activity 6 Relation with others 6 Enjoyment of life 6 What TIME of day is your pain at its worst? morning, evening, night Sleep (in general) Poor  Pain is worse with: walking, standing and some activites Pain improves with: injections Relief from Meds: 5  Mobility walk without assistance  Function disabled: date disabled .  Neuro/Psych No problems in this area  Prior Studies Any changes since last visit?  no  Physicians involved in your care Any changes since last visit?  no   Family History  Problem Relation Age of Onset  . Heart disease Mother   . Diabetes Mother   . Stroke Mother   . Heart disease Father   . Stroke Father   . Diabetes Father   . Hepatitis Sister     hcv  . Stroke Other   . Colon polyps Brother   . Cancer Sister     lung   History   Social History  . Marital Status: Single    Spouse Name: N/A    Number of Children: 0  . Years of Education: N/A   Social History Main Topics  . Smoking status: Former Smoker    Types: Cigarettes    Quit date: 04/26/2007  . Smokeless tobacco: Never Used     Comment: QUIT 2009  . Alcohol Use: No  . Drug Use: No  . Sexual Activity: Yes    Partners: Male     Comment: pt. given condoms   Other Topics Concern  . None   Social History Narrative   Alternate # [phone]   Past Surgical History  Procedure Laterality Date  . Colectomy      2003 for diverticulitis  . Hand surgery    . Bunionectomy      b/l  . Shoulder surgery      left  . Tonsillectomy    . Nasal sinus surgery     Past Medical History  Diagnosis Date  . HIV infection     1994  . Hepatitis C     1b; 02/2012 viral quant [id number]  . Hypertension   . GERD  (gastroesophageal reflux disease)   . Allergy   . Depression   . CHF (congestive heart failure)   . Diverticulitis   . Arthritis   . Chronic back pain   . Hyperlipidemia   . Muscle cramps at night   . History of shingles    BP 133/85  Pulse 90  Resp 14  Ht 5\' 1"  (1.549 m)  Wt 154 lb (69.854 kg)  BMI 29.11 kg/m2  SpO2 98%  Opioid Risk Score:   Fall Risk Score: Low Fall Risk (0-5 points)   Review of Systems     Objective:   Physical Exam        Assessment & Plan:

## 2014-01-21 NOTE — Progress Notes (Signed)
  PROCEDURE RECORD [facility]   Name: [name] [name] DOB:1960/07/25 MRN: [id number]  Date:01/21/2014  Physician: Alysia Penna, MD    Nurse/CMA: Shumaker RN  Allergies:  Allergies  Allergen Reactions  . Acetaminophen Other (See Comments)    Inflamed liver, hospitalized REACTION: Had liver problems, was hospitalized  . Dyazide [Hydrochlorothiazide W-Triamterene] Hives  . Lisinopril     Cough   . Triamterene Hives  . Morphine Rash and Hives    REACTION: rash    Consent Signed: Yes.    Is patient diabetic? No.  CBG today?   Pregnant: No. LMP: No LMP recorded. Patient is postmenopausal. (age 53-55)  Anticoagulants: no Anti-inflammatory: no Antibiotics: no  Procedure: right radiofrequency neurotomy Position: Prone Start Time: 2:05 End Time:2:21  Fluoro Time:24 seconds  RN/CMA Ginkle CMA Shumaker RN    Time 13:37 2:26    BP 133/85 137/79    Pulse 90 105    Respirations 14 14    O2 Sat 98 99    S/S 6 6    Pain Level 8/10 0/10     D/C home with [name], [name] & O X 3, D/C instructions reviewed, and sits independently.

## 2014-01-21 NOTE — Progress Notes (Signed)
RightL5 dorsal ramus., Right L4 and Right L3 medial branch radio frequency neuropathy under fluoroscopic guidance   Indication: Low back pain due to lumbar spondylosis which has been relieved on 2 occasions by greater than 50% by lumbar medial branch blocks at corresponding levels.  Informed consent was obtained after describing risks and benefits of the procedure with the patient, this includes bleeding, bruising, infection, paralysis and medication side effects. The patient wishes to proceed and has given written consent. The patient was placed in a prone position. The lumbar and sacral area was marked and prepped with Betadine. A 25-gauge 1-1/2 inch needle was inserted into the skin and subcutaneous tissue at 3 sites in one ML of 1% lidocaine was injected into each site. Then a 20-gauge 10 cm radio frequency needle with a 1 cm curved active tip was inserted targeting the Right S1 SAP/sacral ala junction. Bone contact was made and confirmed with lateral imaging. Sensory stimulation at 50 Hz followed by motor stimulation at 2 Hz confirm proper needle location followed by injection of one ML of the solution containing one ML of 4 mg per mL dexamethasone and 3 mL of 1% MPF lidocaine. Then the Right L5 SAP/transverse process junction was targeted. Bone contact was made and confirmed with lateral imaging. Sensory stimulation at 50 Hz followed by motor stimulation at 2 Hz confirm proper needle location followed by injection of one ML of the solution containing one ML of 4 mg per mL dexamethasone and 3 mL of 1% MPF lidocaine. Then the Right L4 SAP/transverse process junction was targeted. Bone contact was made and confirmed with lateral imaging. Sensory stimulation at 50 Hz followed by motor stimulation at 2 Hz confirm proper needle location followed by injection of one ML of the solution containing one ML of 4 mg per mL dexamethasone and 3 mL of 1% MPF lidocaine. Radio frequency lesion being at 80C for 90 seconds  was performed. Needles were removed. Post procedure instructions and vital signs were performed. Patient tolerated procedure well. Followup appointment was given. 

## 2014-01-21 NOTE — Patient Instructions (Signed)
You had a radio frequency procedure today This was done to alleviate joint pain in your lumbar area We injected a combination of dexamethasone which is a steroid as well as lidocaine which is a local anesthetic. Dexamethasone made increased blood sugars you are diabetic You may experience soreness at the injection sites. You may also experienced some irritation of the nerves that were heated I'm recommending ice for 30 minutes every 2 hours as needed for the next 24-48 hours In addition he will be taking gabapentin 600 mg 3 times a day today only if this is not one of your usual medicines  

## 2014-01-24 ENCOUNTER — Encounter: Payer: Self-pay | Admitting: Physical Medicine & Rehabilitation

## 2014-02-03 ENCOUNTER — Encounter: Payer: Self-pay | Admitting: Infectious Diseases

## 2014-02-03 ENCOUNTER — Ambulatory Visit (INDEPENDENT_AMBULATORY_CARE_PROVIDER_SITE_OTHER): Payer: Medicaid Other | Admitting: Infectious Diseases

## 2014-02-03 VITALS — BP 144/93 | HR 99 | Temp 98.4°F | Wt 154.0 lb

## 2014-02-03 DIAGNOSIS — T50995A Adverse effect of other drugs, medicaments and biological substances, initial encounter: Secondary | ICD-10-CM

## 2014-02-03 DIAGNOSIS — L270 Generalized skin eruption due to drugs and medicaments taken internally: Secondary | ICD-10-CM | POA: Insufficient documentation

## 2014-02-03 DIAGNOSIS — B20 Human immunodeficiency virus [HIV] disease: Secondary | ICD-10-CM

## 2014-02-03 MED ORDER — PREDNISONE (PAK) 10 MG PO TABS: ORAL_TABLET | Freq: Every day | ORAL | Status: DC

## 2014-02-03 NOTE — Assessment & Plan Note (Signed)
HIV 1 RNA Quant (copies/mL)  Date Value  11/05/2013 <20   05/09/2013 <20   12/26/2012 <20      CD4 T Cell Abs (/uL)  Date Value  11/05/2013 900   05/09/2013 800   12/26/2012 730     Will give her steroid pack to see if we stop this allergic reaction. She needs to change ART, she is taking protonix spaced 12 h from previous evotaz. Her GERD worsened with this. Will check her HLA today to see if perhaps this would be good option. I do not see genotypes in her hx (back to 2012).  LXV/TRV -->ISN/TRV- mild itching --> ATVc/TRV worsened rash.

## 2014-02-03 NOTE — Progress Notes (Signed)
   Subjective:    [name], female    DOB: [birth year], 53 y.o.   MRN: [id number]  Rash   53 yo F with hx of Hep C (tx at [facility] completed 04-2013, repeat Hep C RNA -) and HIV+ (prev LXVr/TRV). She had nasal septoplasty 01-05-10. She was previously changed to [place] in 2013. She was then changed to TRV/DTGV in anticipation of starting Hep C treatment.  She developed a rash on this and was then changed to ISN/TRV (which she takes with hydroxizine due to pruritis, rash).  She was changed to ATVc/TRV which she has been on for 6 weeks. She has since developed a rash and pruritis. Has been taking a lot of hydroxyzine without relief. She is off art for last 2 weeks. She is still taking her anti-htn rx.   Unimproved with benadryl.   "My chest feels like leather".   Has been getting lumbar injections at [facility] with some relief.   Review of Systems  Skin: Positive for rash.   Eating well, no dysphagia. Has decreased appetite. Does not feel that this is food allergy. Feels like rash is worse at night, has to take valium to get to sleep. No cough, no sob. Thought she had URI last week.     Objective:   Physical Exam  Constitutional: She appears well-developed and well-nourished.  HENT:  Mouth/Throat: No oropharyngeal exudate.  Eyes: EOM are normal. Pupils are equal, round, and reactive to light.  Neck: Neck supple.  Cardiovascular: Normal rate, regular rhythm and normal heart sounds.   Pulmonary/Chest: Effort normal and breath sounds normal.  Abdominal: Soft. Bowel sounds are normal. There is no tenderness.  Lymphadenopathy:    She has no cervical adenopathy.  Skin: Rash noted. Rash is papular.             Assessment & Plan:

## 2014-02-03 NOTE — Assessment & Plan Note (Signed)
Please see HIV note

## 2014-02-04 ENCOUNTER — Other Ambulatory Visit: Payer: Medicaid Other

## 2014-02-04 ENCOUNTER — Other Ambulatory Visit: Payer: Self-pay | Admitting: Licensed Clinical Social Worker

## 2014-02-04 DIAGNOSIS — B20 Human immunodeficiency virus [HIV] disease: Secondary | ICD-10-CM

## 2014-02-04 DIAGNOSIS — L270 Generalized skin eruption due to drugs and medicaments taken internally: Secondary | ICD-10-CM

## 2014-02-04 MED ORDER — PREDNISONE (PAK) 10 MG PO TABS: ORAL_TABLET | Freq: Every day | ORAL | Status: DC

## 2014-02-07 ENCOUNTER — Encounter: Payer: Self-pay | Admitting: Internal Medicine

## 2014-02-07 DIAGNOSIS — Z9622 Myringotomy tube(s) status: Secondary | ICD-10-CM | POA: Insufficient documentation

## 2014-02-07 LAB — HLA B*5701: HLA-B 5701 W/RFLX HLA-B HIGH: NEGATIVE

## 2014-02-18 ENCOUNTER — Ambulatory Visit (INDEPENDENT_AMBULATORY_CARE_PROVIDER_SITE_OTHER): Payer: Medicaid Other | Admitting: Infectious Diseases

## 2014-02-18 ENCOUNTER — Encounter: Payer: Self-pay | Admitting: Infectious Diseases

## 2014-02-18 ENCOUNTER — Telehealth: Payer: Self-pay | Admitting: *Deleted

## 2014-02-18 VITALS — Temp 98.9°F | Ht 61.0 in | Wt 157.0 lb

## 2014-02-18 DIAGNOSIS — L270 Generalized skin eruption due to drugs and medicaments taken internally: Secondary | ICD-10-CM

## 2014-02-18 DIAGNOSIS — T50995A Adverse effect of other drugs, medicaments and biological substances, initial encounter: Secondary | ICD-10-CM

## 2014-02-18 DIAGNOSIS — B20 Human immunodeficiency virus [HIV] disease: Secondary | ICD-10-CM

## 2014-02-18 NOTE — Progress Notes (Signed)
   Subjective:    [name], female    DOB: 10/14/1960, 53 y.o.   MRN: [id number]  HPI 53 yo F with hx of Hep C (tx at [facility] completed 04-2013, repeat Hep C RNA -) and HIV+ (prev LXVr/TRV). She had nasal septoplasty 01-05-10. She was previously changed to [place] in 2013. She was then changed to TRV/DTGV in anticipation of starting Hep C treatment.  She developed a rash on this and was then changed to ISN/TRV (which she takes with hydroxizine due to pruritis, rash).  She was changed to ATVc/TRV which she developed a rash and pruritis.  She was given a course of prednisone (gained 3#). Did not help. Atarax helps if she takes "5"! Feels nauseated all the time.  Her brother in-law passed away recently, she has felt stressed. Can't sleep without valium. Has switched all soap/detergent to fragrance free. Has changed her bed linens.   Only taking protonix, zyrtec, singulair, atarax, flonase, norvasc, valium, zofran, KCl, tramadol, prn lumbar injections.    HIV 1 RNA Quant (copies/mL)  Date Value  11/05/2013 <20   05/09/2013 <20   12/26/2012 <20      CD4 T Cell Abs (/uL)  Date Value  11/05/2013 900   05/09/2013 800   12/26/2012 730       Review of Systems     Objective:   Physical Exam  Constitutional: She appears well-developed and well-nourished.  HENT:  Mouth/Throat: No oropharyngeal exudate.  Eyes: EOM are normal. Pupils are equal, round, and reactive to light.  Neck: Neck supple.  Cardiovascular: Normal rate, regular rhythm and normal heart sounds.   Pulmonary/Chest: Effort normal and breath sounds normal.  Abdominal: Soft. Bowel sounds are normal. She exhibits no distension. There is no tenderness.  Musculoskeletal: She exhibits no edema.  Lymphadenopathy:    She has no cervical adenopathy.          Assessment & Plan:

## 2014-02-18 NOTE — Assessment & Plan Note (Signed)
Not sure what is causing this. Will continue to hold her ART (her last CD4 was 900). Will have her seen by allergy, she has seen Dr Shaune Leeks before.

## 2014-02-18 NOTE — Telephone Encounter (Signed)
Patient called and advised she tried to call the allergy doctor she used to see and was advised she will need a referral as she has not been seen in 3 years. Advised she wants to see Dr Neldon Mc at [facility] and wants Dr Johnnye Sima to send them a referral. Advised her sure it will not be a problem but have to ask the doctor and give her a call back once it is done.

## 2014-02-18 NOTE — Assessment & Plan Note (Signed)
Will continue to hold her ART. She has gotten flu shot. Will see her back in 1 month.

## 2014-02-19 ENCOUNTER — Other Ambulatory Visit: Payer: Self-pay | Admitting: Infectious Diseases

## 2014-02-19 DIAGNOSIS — J309 Allergic rhinitis, unspecified: Secondary | ICD-10-CM

## 2014-02-19 NOTE — Telephone Encounter (Signed)
Please, thanks.

## 2014-02-19 NOTE — Telephone Encounter (Addendum)
Patient called asking if she could increase to 2 tablets atarax q 8 hours - ok per Dr. Johnnye Sima. Patient has an appointment 11/17 1:30 with Dr. Neldon Mc. Patient is advised to use oatmeal baths, use gentle pat/blotting to dry her skin - no scrubbing, use cerve/eucerin salve to maintain skin integrity (pt states aveeno did not work). Pt verbalized agreement. Faxed last office note and reason for referral to Dr. Bruna Potter office at front desk's request.

## 2014-02-24 ENCOUNTER — Telehealth: Payer: Self-pay | Admitting: *Deleted

## 2014-02-24 NOTE — Telephone Encounter (Signed)
Patient called c/o intense itching and worsening rash. Appointment with Dr. Carmelina Peal is not until 03/11/14. Called allergist and requested a sooner appointment. They are able to work her in tomorrow at 2:00 pm. Patient notified

## 2014-03-05 ENCOUNTER — Encounter: Payer: Self-pay | Admitting: Internal Medicine

## 2014-03-05 ENCOUNTER — Ambulatory Visit (INDEPENDENT_AMBULATORY_CARE_PROVIDER_SITE_OTHER): Payer: Medicaid Other | Admitting: Internal Medicine

## 2014-03-05 VITALS — BP 150/86 | HR 108 | Temp 98.0°F | Ht 61.0 in | Wt 152.5 lb

## 2014-03-05 DIAGNOSIS — T50905D Adverse effect of unspecified drugs, medicaments and biological substances, subsequent encounter: Secondary | ICD-10-CM

## 2014-03-05 DIAGNOSIS — R358 Other polyuria: Secondary | ICD-10-CM

## 2014-03-05 DIAGNOSIS — R631 Polydipsia: Secondary | ICD-10-CM

## 2014-03-05 DIAGNOSIS — R3589 Other polyuria: Secondary | ICD-10-CM

## 2014-03-05 DIAGNOSIS — L270 Generalized skin eruption due to drugs and medicaments taken internally: Secondary | ICD-10-CM

## 2014-03-05 MED ORDER — DIPHENHYDRAMINE HCL 25 MG PO TABS: 25.0000 mg | ORAL_TABLET | Freq: Every evening | ORAL | Status: DC | PRN

## 2014-03-05 NOTE — Assessment & Plan Note (Signed)
Rash seems to be improving though pt still itching. Still unclear etiology  Will continue Atarax 50 mg tid prn. Add Benadryl 25 to 50 mg qhs for itching at night to see if helps. If no relief of itching at night consider Doxepin 10 mg qhs prn but check for DDI Continue Mometasone oint to rash and moisturizing.   RTC in 2-3 months Will f/u with Dr. Johnnye Sima end of this month

## 2014-03-05 NOTE — Patient Instructions (Addendum)
General Instructions: Please follow up with Dr. Johnnye Sima as scheduled  I will try to get the records from allergy and immunology office  You can take Atarax 2 pills three times a day and 25 to 50 mg of Benadryl at night  Keep using the steroid cream to your skin as needed  Take care and follow up in 2-3 months, sooner if needed  We will check your labs today to work up the cause of increased thirst and urination but try to decrease the amount of fluids your are drinking    Treatment Goals:  Goals (1 Years of Data) as of 03/05/14          As of Today 02/03/14 01/21/14 12/12/13 11/18/13     Blood Pressure   . Blood Pressure < 140/90  150/86 144/93 133/85 153/83 135/90      Progress Toward Treatment Goals:  Treatment Goal 03/05/2014  Blood pressure deteriorated    Self Care Goals & Plans:  Self Care Goal 03/05/2014  Manage my medications take my medicines as prescribed; bring my medications to every visit; refill my medications on time; follow the sick day instructions if I am sick  Monitor my health keep track of my blood pressure  Eat healthy foods drink diet soda or water instead of juice or soda; eat more vegetables; eat foods that are low in salt; eat baked foods instead of fried foods; eat fruit for snacks and desserts; eat smaller portions  Be physically active find an activity I enjoy  Meeting treatment goals maintain the current self-care plan    No flowsheet data found.   Care Management & Community Referrals:  Referral 03/05/2014  Referrals made for care management support none needed  Referrals made to community resources none     Rash A rash is a change in the color or texture of your skin. There are many different types of rashes. You may have other problems that accompany your rash. CAUSES   Infections.  Allergic reactions. This can include allergies to pets or foods.  Certain medicines.  Exposure to certain chemicals, soaps, or  cosmetics.  Heat.  Exposure to poisonous plants.  Tumors, both cancerous and noncancerous. SYMPTOMS   Redness.  Scaly skin.  Itchy skin.  Dry or cracked skin.  Bumps.  Blisters.  Pain. DIAGNOSIS  Your caregiver may do a physical exam to determine what type of rash you have. A skin sample (biopsy) may be taken and examined under a microscope. TREATMENT  Treatment depends on the type of rash you have. Your caregiver may prescribe certain medicines. For serious conditions, you may need to see a skin doctor (dermatologist). HOME CARE INSTRUCTIONS   Avoid the substance that caused your rash.  Do not scratch your rash. This can cause infection.  You may take cool baths to help stop itching.  Only take over-the-counter or prescription medicines as directed by your caregiver.  Keep all follow-up appointments as directed by your caregiver. SEEK IMMEDIATE MEDICAL CARE IF:  You have increasing pain, swelling, or redness.  You have a fever.  You have new or severe symptoms.  You have body aches, diarrhea, or vomiting.  Your rash is not better after 3 days. MAKE SURE YOU:  Understand these instructions.  Will watch your condition.  Will get help right away if you are not doing well or get worse. Document Released: 04/01/2002 Document Revised: 07/04/2011 Document Reviewed: 01/24/2011 [facility] Patient Information 2015 [place], [place]. This information is not intended to  replace advice given to you by your health care provider. Make sure you discuss any questions you have with your health care provider.

## 2014-03-05 NOTE — Assessment & Plan Note (Addendum)
Pt c/o increased thirst.  Patient could be drinking too much water asked to cut back, other ddx include DI.  She is not on a diuretic currently Will check urine and plasma os, urine Na to further w/u for other etiology  Per up to date a low serum Na, low urine os <1/2 plasma os indicates primary polydipsia and a nl Na if urine os >600 excludes DI Will reassess at f/u to see if improved after reducing water intake.  Could consider 24 hour urine as well

## 2014-03-05 NOTE — Progress Notes (Signed)
Subjective:    [name], female    DOB: [birth year], 53 y.o.   MRN: [id number]  HPI Comments: 53 y.o with well controlled HIV cd4 900 VL <20 as of 10/2013(followed by Dr. Johnnye Sima), hepatitis C 1b (stage 2 via bipsy 03/2012 no evidence of cirrhosis,treated at [facility] with Simeprevir and Sofosbuvir 01/2013 x 12 weeks after tx HCV undetected 06/2013), HTN (BP 150/86), dyslipidemia (04/2013 109), allergic rhinitis, GERD, insomnia, chronic low back pain s/p lumbar branch blocks 6/15, 8/15, 9/15) x multiple times for arthritis and bulging disk (Dr. Letta Pate), postherpetic neuralgia, type 1 RTA, chronic lower extremity edema, venous insufficiency.   She presents for f/u for  1. Rash-rash started 12/2013 when her HIV meds where changed to [place] during the time of tx for HCV.  Rash started on her neck and moved down to lower extremities (ankles), stomach, breasts, buttocks, back.  She states her skin felt like leather at the time of the rash, her skin was itching as well and broken out at the time.  Her skin looks better now.  Dr. Johnnye Sima dc'ed Isentress and put her on Evotaz but the rash was worse so Evotaz and Truvada were stopped.  She tried Cerave w/o much relief.  She was referred to Allergy and Immunology who she saw 02/25/14 and they states her labs were abnormal so she would like to review her labs today.  They also gave her mometasone ointment to use to her skin which has helped clear the rash but she is still itching esp at night.  Per Dr. Johnnye Sima okay she has increased Atarax to 50 mg tid which has not relieved much and she states she has increased anxiety and itching at night preventing her from sleeping.  She does take a valium at night to help relieve sx's.    She has also tried and Prednisone dose pack but is now complete.  Per Dr. Johnnye Sima now she is off HAART tx (at least since 01/2014) and will f/u with him later on this month.  She wants to know if there is something else she  can take for itching esp. At night.    2. Increased thirst and urinating a lot (day and night)-She states this has been going on for a while.  She will drink a 24 pack of water in 3 days.  Denies dysuria.   3. Reviewed [facility] labs from 02/25/14 when pt went to see [place] maintenance: She had colonoscopy with Dr. Deatra Ina and is not due for colonoscopy currently until 2017     Review of Systems  Eyes:       Always problems with blurry vision but no peripheral vision changes   Respiratory: Negative for shortness of breath.   Cardiovascular: Negative for chest pain and leg swelling.       Objective:   Physical Exam  Constitutional: She is oriented to person, place, and time. She appears well-developed and well-nourished. She is cooperative. No distress.  HENT:  Head: Normocephalic and atraumatic.  Mouth/Throat: Oropharynx is clear and moist and mucous membranes are normal. No oropharyngeal exudate.  Eyes: Conjunctivae are normal. Right eye exhibits no discharge. Left eye exhibits no discharge. No scleral icterus.  Cardiovascular: Normal rate, regular rhythm, S1 normal, S2 normal and normal heart sounds.   No murmur heard. No lower ext edema   Pulmonary/Chest: Effort normal and breath sounds normal. No respiratory distress. She has no wheezes.  Abdominal: Soft. Bowel sounds are  normal. There is no tenderness.  Neurological: She is alert and oriented to person, place, and time. She has normal strength. Gait normal.  Skin: Skin is warm, dry and intact. No rash noted. She is not diaphoretic.  Rash improved though healing hyperpigmented lesions to forearms and slight erythema to right breast, no rash on neck/legs  Psychiatric: She has a normal mood and affect. Her speech is normal and behavior is normal. Judgment and thought content normal. Cognition and memory are normal.  Nursing note and vitals reviewed.         Assessment & Plan:  F/u in 2-3 months, sooner if needed

## 2014-03-06 LAB — OSMOLALITY: OSMOLALITY: 291 mosm/kg (ref 275–300)

## 2014-03-06 LAB — OSMOLALITY, URINE: OSMOLALITY UR: 485 mosm/kg (ref 390–1090)

## 2014-03-06 LAB — SODIUM, URINE, RANDOM: SODIUM UR: 37 meq/L

## 2014-03-11 NOTE — Addendum Note (Signed)
Addended by: Cresenciano Genre on: 03/11/2014 02:19 AM   Modules accepted: Level of Service

## 2014-03-11 NOTE — Progress Notes (Signed)
INTERNAL MEDICINE TEACHING ATTENDING ADDENDUM - Rockney Grenz, MD: I reviewed and discussed at the time of visit with the resident Dr. McLean, the patient's medical history, physical examination, diagnosis and results of pertinent tests and treatment and I agree with the patient's care as documented.  

## 2014-03-17 ENCOUNTER — Encounter: Payer: Self-pay | Admitting: Infectious Diseases

## 2014-03-17 ENCOUNTER — Ambulatory Visit (INDEPENDENT_AMBULATORY_CARE_PROVIDER_SITE_OTHER): Payer: Medicaid Other | Admitting: Infectious Diseases

## 2014-03-17 VITALS — BP 146/91 | HR 97 | Temp 98.1°F | Wt 150.0 lb

## 2014-03-17 DIAGNOSIS — B20 Human immunodeficiency virus [HIV] disease: Secondary | ICD-10-CM

## 2014-03-17 DIAGNOSIS — T50995A Adverse effect of other drugs, medicaments and biological substances, initial encounter: Secondary | ICD-10-CM

## 2014-03-17 DIAGNOSIS — L270 Generalized skin eruption due to drugs and medicaments taken internally: Secondary | ICD-10-CM

## 2014-03-17 NOTE — Progress Notes (Signed)
   Subjective:    [name], female    DOB: [birth year], 53 y.o.   MRN: [id number]  HPI 53 yo F with hx of Hep C (tx at [facility] completed 04-2013, repeat Hep C RNA -) and HIV+ (prev LXVr/TRV). She had nasal septoplasty 01-05-10. She was previously changed to [place] in 2013. She was then changed to TRV/DTGV in anticipation of starting Hep C treatment.  She developed a rash on this and was then changed to ISN/TRV (which she takes with hydroxizine due to pruritis, rash).  She was changed to ATVc/TRV which her rash persisted on, then was taken off ART.  She has seen allergy- she got IM steroid (11-19), her rash has improved with steroid cream. Continues to have diffuse pruritis. Is worse at night. Still taking valium to help her sleep.  Also taking- benadryl 50mg  qhs, atarax 50mg  qam.  Has changed her soap, detergents.   Is exhausted. Has cut her nails shorter. Even wearing clothes is irritating her skin. Has return visit to allergy on 12-4.  Concerned she needs to restart her ART.   HIV 1 RNA QUANT (copies/mL)  Date Value  11/05/2013 <20  05/09/2013 <20  12/26/2012 <20   CD4 T CELL ABS (/uL)  Date Value  11/05/2013 900  05/09/2013 800  12/26/2012 730   Review of Systems  Constitutional: Positive for unexpected weight change. Negative for appetite change.  Gastrointestinal: Negative for diarrhea and constipation.  Genitourinary: Negative for difficulty urinating.  states that the "tube blew out in my left ear". Can't sit still long enough to have it replaced.      Objective:   Physical Exam  Constitutional: She appears well-developed and well-nourished. No distress.  HENT:  Right Ear: Tympanic membrane is scarred, perforated and retracted.  Left Ear: Tympanic membrane is scarred, perforated and retracted.  Mouth/Throat: No oropharyngeal exudate.  Eyes: EOM are normal. Pupils are equal, round, and reactive to light.  Neck: Neck supple.  Cardiovascular: Normal rate,  regular rhythm and normal heart sounds.   Pulmonary/Chest: Effort normal and breath sounds normal.  Abdominal: Soft. Bowel sounds are normal. There is no tenderness. There is no rebound.  Lymphadenopathy:    She has no cervical adenopathy.  Skin:  Her rash is much better, there is decreased erythema of her skin. There scattered excoriated areas.           Assessment & Plan:

## 2014-03-17 NOTE — Assessment & Plan Note (Signed)
She appears to be doing well off meds. Will recheck her labs today to see where her immune status is, see her back in 6 weeks.

## 2014-03-17 NOTE — Assessment & Plan Note (Signed)
She has made some progress. Will see if she can be eval in Allergy this week to improve her pruritis.

## 2014-03-18 ENCOUNTER — Other Ambulatory Visit: Payer: Self-pay | Admitting: Internal Medicine

## 2014-03-18 LAB — HIV-1 RNA QUANT-NO REFLEX-BLD
HIV 1 RNA Quant: 26300 copies/mL — ABNORMAL HIGH (ref <20)
HIV-1 RNA Quant, Log: 4.42 {Log} — ABNORMAL HIGH (ref <1.30)

## 2014-03-18 LAB — T-HELPER CELL (CD4) - (RCID CLINIC ONLY)
CD4 % Helper T Cell: 18 % — ABNORMAL LOW (ref 33–55)
CD4 T Cell Abs: 460 /uL (ref 400–2700)

## 2014-03-21 ENCOUNTER — Other Ambulatory Visit: Payer: Self-pay | Admitting: Infectious Diseases

## 2014-04-14 ENCOUNTER — Encounter: Payer: Self-pay | Admitting: Infectious Diseases

## 2014-04-14 ENCOUNTER — Ambulatory Visit (INDEPENDENT_AMBULATORY_CARE_PROVIDER_SITE_OTHER): Payer: Medicaid Other | Admitting: Infectious Diseases

## 2014-04-14 VITALS — BP 151/92 | HR 93 | Temp 97.9°F | Wt 149.0 lb

## 2014-04-14 DIAGNOSIS — H73893 Other specified disorders of tympanic membrane, bilateral: Secondary | ICD-10-CM

## 2014-04-14 DIAGNOSIS — H73823 Atrophic nonflaccid tympanic membrane, bilateral: Secondary | ICD-10-CM

## 2014-04-14 DIAGNOSIS — F411 Generalized anxiety disorder: Secondary | ICD-10-CM

## 2014-04-14 DIAGNOSIS — B20 Human immunodeficiency virus [HIV] disease: Secondary | ICD-10-CM

## 2014-04-14 DIAGNOSIS — H73899 Other specified disorders of tympanic membrane, unspecified ear: Secondary | ICD-10-CM | POA: Insufficient documentation

## 2014-04-14 DIAGNOSIS — R21 Rash and other nonspecific skin eruption: Secondary | ICD-10-CM

## 2014-04-14 NOTE — Assessment & Plan Note (Signed)
She will f/u with allergy tomorrow (I greatly appreciate their f/u). She will get her derm referral then.

## 2014-04-14 NOTE — Progress Notes (Signed)
   Subjective:    [name], female    DOB: [birth year], 53 y.o.   MRN: [id number]  HPI 53 yo F with hx of Hep C (tx at [facility] completed 04-2013, repeat Hep C RNA -) and HIV+ (prev LXVr/TRV). She had nasal septoplasty 01-05-10. She was previously changed to [place] in 2013. She was then changed to TRV/DTGV in anticipation of starting Hep C treatment.  She developed a rash on this and was then changed to ISN/TRV (which she takes with hydroxizine due to pruritis, rash).  She was changed to ATVc/TRV which her rash persisted on, then was taken off ART.  She has seen allergy- she got IM steroid (11-19), her rash has improved with steroid cream. Continues to have diffuse pruritis. Is worse at night. Still taking valium to help her sleep.  Also taking- benadryl 50mg  qhs, atarax 50mg  qam.  Has changed her soap, detergents.   Has been seen by ENT due to hearing loss. She is waiting for a procedure for this.. Her itching has improved, post steroids and multiple anti-histamines. Still taking atarax. Has pending derm appt. (Meidcaid) Still has occas rash on her back and stomach.   She also was treated for scabies.  Having a lot of anxiety.   HIV 1 RNA QUANT (copies/mL)  Date Value  03/17/2014 26300*  11/05/2013 <20  05/09/2013 <20   CD4 T CELL ABS (/uL)  Date Value  03/17/2014 460  11/05/2013 900  05/09/2013 800   Review of Systems  HENT: Positive for congestion and hearing loss.   Skin: Positive for rash.      Objective:   Physical Exam  Constitutional: She appears well-developed and well-nourished.  HENT:  Right Ear: Tympanic membrane is perforated and retracted.  Left Ear: Tympanic membrane is perforated and retracted.  Mouth/Throat: No oropharyngeal exudate.  Eyes: EOM are normal. Pupils are equal, round, and reactive to light.  Neck: Neck supple.  Cardiovascular: Normal rate, regular rhythm and normal heart sounds.   Pulmonary/Chest: Effort normal and breath sounds  normal.  Abdominal: Soft. Bowel sounds are normal. She exhibits no distension. There is no tenderness.  Lymphadenopathy:    She has no cervical adenopathy.          Assessment & Plan:

## 2014-04-14 NOTE — Assessment & Plan Note (Signed)
Spoke with ENT, they will call her tomorrow to set up her appt

## 2014-04-14 NOTE — Assessment & Plan Note (Signed)
Her CD4 count has dropped, will wait til she has cleared her rash, had derm eval, to restart her meds.  Will see her back in 6 weeks.

## 2014-04-15 ENCOUNTER — Telehealth: Payer: Self-pay | Admitting: *Deleted

## 2014-04-15 MED ORDER — DIAZEPAM 5 MG PO TABS: 5.0000 mg | ORAL_TABLET | Freq: Every evening | ORAL | Status: DC | PRN

## 2014-04-15 NOTE — Addendum Note (Signed)
Addended by: Zerick Prevette C on: 04/15/2014 11:19 AM   Modules accepted: Orders

## 2014-04-15 NOTE — Telephone Encounter (Signed)
Patient called, concerned that she had been released from Dr. Bruna Potter care.  RN spoke with Dr. Neldon Mc on her behalf.  Per Dr. Neldon Mc, patient's rash is 90% better, should be 100% resolved in 2-4 weeks.  He recommends that the patient should stay off Isentress and Evotaz, and will be available if she develops a reaction to a different protease inhibitor.  She is to return PRN, but has nothing scheduled at this time.  He does recommend that she go ahead and resume her medications per Dr. Algis Downs recommendation. Patient would like to restart medication now, would like to know if Dr. Johnnye Sima would like to see her before February.  Please advise on 1) ARV and 2) follow up. Landis Gandy, RN

## 2014-04-16 NOTE — Telephone Encounter (Signed)
She should start darunavir 800mg  daily novir 100mg  daily truvada 1 tab daily Start in January thanks

## 2014-04-17 ENCOUNTER — Other Ambulatory Visit: Payer: Self-pay | Admitting: Internal Medicine

## 2014-04-17 ENCOUNTER — Other Ambulatory Visit: Payer: Self-pay | Admitting: Infectious Diseases

## 2014-04-21 ENCOUNTER — Other Ambulatory Visit: Payer: Self-pay | Admitting: *Deleted

## 2014-04-21 DIAGNOSIS — B20 Human immunodeficiency virus [HIV] disease: Secondary | ICD-10-CM

## 2014-04-21 MED ORDER — RITONAVIR 100 MG PO TABS: 100.0000 mg | ORAL_TABLET | Freq: Every day | ORAL | Status: DC

## 2014-04-21 MED ORDER — EMTRICITABINE-TENOFOVIR DF 200-300 MG PO TABS
1.0000 | ORAL_TABLET | Freq: Every day | ORAL | Status: DC
Start: 2014-04-21 — End: 2014-08-12

## 2014-04-21 MED ORDER — DARUNAVIR ETHANOLATE 800 MG PO TABS: 800.0000 mg | ORAL_TABLET | Freq: Every day | ORAL | Status: DC

## 2014-04-25 DIAGNOSIS — E119 Type 2 diabetes mellitus without complications: Secondary | ICD-10-CM

## 2014-04-25 HISTORY — DX: Type 2 diabetes mellitus without complications: E11.9

## 2014-05-02 ENCOUNTER — Encounter: Payer: Self-pay | Admitting: *Deleted

## 2014-05-15 ENCOUNTER — Encounter: Payer: Medicaid Other | Admitting: Internal Medicine

## 2014-05-19 ENCOUNTER — Other Ambulatory Visit: Payer: Self-pay | Admitting: Infectious Diseases

## 2014-05-19 ENCOUNTER — Other Ambulatory Visit: Payer: Self-pay | Admitting: Internal Medicine

## 2014-05-21 ENCOUNTER — Ambulatory Visit: Payer: Medicaid Other

## 2014-05-23 ENCOUNTER — Ambulatory Visit (HOSPITAL_BASED_OUTPATIENT_CLINIC_OR_DEPARTMENT_OTHER): Payer: Medicaid Other | Admitting: Physical Medicine & Rehabilitation

## 2014-05-23 ENCOUNTER — Encounter: Payer: Self-pay | Admitting: Physical Medicine & Rehabilitation

## 2014-05-23 ENCOUNTER — Encounter: Payer: Medicaid Other | Attending: Physical Medicine & Rehabilitation

## 2014-05-23 VITALS — BP 160/98 | HR 102 | Resp 16

## 2014-05-23 DIAGNOSIS — M47817 Spondylosis without myelopathy or radiculopathy, lumbosacral region: Secondary | ICD-10-CM

## 2014-05-23 DIAGNOSIS — R252 Cramp and spasm: Secondary | ICD-10-CM | POA: Insufficient documentation

## 2014-05-23 MED ORDER — METHOCARBAMOL 500 MG PO TABS: 500.0000 mg | ORAL_TABLET | Freq: Three times a day (TID) | ORAL | Status: DC | PRN

## 2014-05-23 NOTE — Progress Notes (Signed)
Subjective:    [name], female    DOB: 08/26/1960, 54 y.o.   MRN: [id number]  HPI Patient reports a 50% relief of her low back pain Left-sided L3 L4 L5 radiofrequency ablation performed in August 2015 Right-sided L3 L4 L5 radiofrequency ablation performed September 2015  Had a drop in CD4 count had some problems with her HIV medications which have now been adjusted.  Patient reports being more active she is taking the tramadol about 3 times per day. Leg cramps at night. Plans to see her infectious disease doctor for this. Would like to try some muscle relaxer medications for this Pain Inventory Average Pain 9 Pain Right Now 7 My pain is intermittent, sharp, burning and aching  In the last 24 hours, has pain interfered with the following? General activity 5 Relation with others 7 Enjoyment of life 5 What TIME of day is your pain at its worst? evening and night Sleep (in general) Poor  Pain is worse with: walking, bending and some activites Pain improves with: rest and medication Relief from Meds: 0  Mobility walk without assistance ability to climb steps?  yes do you drive?  yes  Function disabled: date disabled .  Neuro/Psych weakness spasms  Prior Studies Any changes since last visit?  no  Physicians involved in your care Any changes since last visit?  no   Family History  Problem Relation Age of Onset  . Heart disease Mother   . Diabetes Mother   . Stroke Mother   . Heart disease Father   . Stroke Father   . Diabetes Father   . Hepatitis Sister     hcv  . Stroke Other   . Colon polyps Brother   . Cancer Sister     lung   History   Social History  . Marital Status: Single    Spouse Name: N/A    Number of Children: 0  . Years of Education: N/A   Social History Main Topics  . Smoking status: Former Smoker    Types: Cigarettes    Quit date: 04/26/2007  . Smokeless tobacco: Never Used     Comment: QUIT 2009  . Alcohol Use:  No  . Drug Use: No  . Sexual Activity:    Partners: Male     Comment: pt. given condoms   Other Topics Concern  . None   Social History Narrative   Alternate # [phone]   Past Surgical History  Procedure Laterality Date  . Colectomy      2003 for diverticulitis  . Hand surgery    . Bunionectomy      b/l  . Shoulder surgery      left  . Tonsillectomy    . Nasal sinus surgery     Past Medical History  Diagnosis Date  . HIV infection     1994  . Hepatitis C     1b; 02/2012 viral quant [id number]  . Hypertension   . GERD (gastroesophageal reflux disease)   . Allergy   . Depression   . CHF (congestive heart failure)   . Diverticulitis   . Arthritis   . Chronic back pain   . Hyperlipidemia   . Muscle cramps at night   . History of shingles    BP 160/98 mmHg  Pulse 102  Resp 16  SpO2 98%  Opioid Risk Score:   Fall Risk Score: Low Fall Risk (0-5 points) Review of Systems  Constitutional:  Night sweats  HENT: Negative.   Eyes: Negative.   Respiratory: Negative.   Cardiovascular: Negative.   Gastrointestinal: Positive for nausea.  Endocrine: Negative.   Genitourinary: Negative.   Musculoskeletal: Negative.   Skin: Negative.   Allergic/Immunologic: Negative.   Neurological: Positive for weakness.       Spasms  Hematological: Negative.   Psychiatric/Behavioral: Negative.        Objective:   Physical Exam  Calves are nontender to palpation no swelling noted. No numbness in the lower extremities Motor strength is 5/5 bilateral hip flexor and knee extensor ankle dorsal flexor.  Low back is limited range of motion with extension greater than with flexion. She has more pain with extension than with flexion.      Assessment & Plan:   1. Lumbar spondylosis overall doing better after radiofrequency ablation she is 5 months post left-sided ablation and 4 months post right-sided ablation We discussed that it is difficult to predict exactly how long  this will last but generally 6-12 months is the average. We can repeat it once we get to 6 months post if needed.  2. Muscle cramps do not think this is back related, we'll give her some muscle relaxers to see if this helps until she can get in with her infectious disease doctor

## 2014-05-23 NOTE — Patient Instructions (Signed)
Trial of methocarbamol which is a muscle relaxer for your Pains. I do not think your Problem is related to her back problem. We can repeat radiofrequency ablation as soon as one more month on the left side and 2 more months on the right side if needed. Hopefully this procedure will last year or year

## 2014-05-26 ENCOUNTER — Ambulatory Visit (INDEPENDENT_AMBULATORY_CARE_PROVIDER_SITE_OTHER): Payer: Medicaid Other | Admitting: Infectious Diseases

## 2014-05-26 ENCOUNTER — Other Ambulatory Visit: Payer: Self-pay | Admitting: Licensed Clinical Social Worker

## 2014-05-26 ENCOUNTER — Other Ambulatory Visit: Payer: Self-pay | Admitting: *Deleted

## 2014-05-26 ENCOUNTER — Encounter: Payer: Self-pay | Admitting: Infectious Diseases

## 2014-05-26 VITALS — BP 130/84 | HR 106 | Temp 98.0°F | Wt 154.0 lb

## 2014-05-26 DIAGNOSIS — Z113 Encounter for screening for infections with a predominantly sexual mode of transmission: Secondary | ICD-10-CM

## 2014-05-26 DIAGNOSIS — J3089 Other allergic rhinitis: Secondary | ICD-10-CM

## 2014-05-26 DIAGNOSIS — R252 Cramp and spasm: Secondary | ICD-10-CM

## 2014-05-26 DIAGNOSIS — B20 Human immunodeficiency virus [HIV] disease: Secondary | ICD-10-CM

## 2014-05-26 DIAGNOSIS — Z79899 Other long term (current) drug therapy: Secondary | ICD-10-CM

## 2014-05-26 LAB — CBC
HCT: 42.7 % (ref 36.0–46.0)
Hemoglobin: 14.2 g/dL (ref 12.0–15.0)
MCH: 25.4 pg — AB (ref 26.0–34.0)
MCHC: 33.3 g/dL (ref 30.0–36.0)
MCV: 76.4 fL — ABNORMAL LOW (ref 78.0–100.0)
MPV: 9.8 fL (ref 8.6–12.4)
Platelets: 250 10*3/uL (ref 150–400)
RBC: 5.59 MIL/uL — AB (ref 3.87–5.11)
RDW: 18.2 % — ABNORMAL HIGH (ref 11.5–15.5)
WBC: 9.2 10*3/uL (ref 4.0–10.5)

## 2014-05-26 LAB — COMPREHENSIVE METABOLIC PANEL
ALT: 27 U/L (ref 0–35)
AST: 19 U/L (ref 0–37)
Albumin: 4.1 g/dL (ref 3.5–5.2)
Alkaline Phosphatase: 119 U/L — ABNORMAL HIGH (ref 39–117)
BUN: 12 mg/dL (ref 6–23)
CHLORIDE: 105 meq/L (ref 96–112)
CO2: 24 meq/L (ref 19–32)
CREATININE: 0.64 mg/dL (ref 0.50–1.10)
Calcium: 8.8 mg/dL (ref 8.4–10.5)
Glucose, Bld: 96 mg/dL (ref 70–99)
Potassium: 4.3 mEq/L (ref 3.5–5.3)
Sodium: 137 mEq/L (ref 135–145)
Total Bilirubin: 0.4 mg/dL (ref 0.2–1.2)
Total Protein: 8 g/dL (ref 6.0–8.3)

## 2014-05-26 LAB — LIPID PANEL
CHOLESTEROL: 208 mg/dL — AB (ref 0–200)
HDL: 58 mg/dL (ref >39)
LDL Cholesterol: 119 mg/dL — ABNORMAL HIGH (ref 0–99)
Total CHOL/HDL Ratio: 3.6 Ratio
Triglycerides: 154 mg/dL — ABNORMAL HIGH (ref <150)
VLDL: 31 mg/dL (ref 0–40)

## 2014-05-26 LAB — CK: Total CK: 183 U/L — ABNORMAL HIGH (ref 7–177)

## 2014-05-26 MED ORDER — DIAZEPAM 5 MG PO TABS: 5.0000 mg | ORAL_TABLET | Freq: Every evening | ORAL | Status: DC | PRN

## 2014-05-26 MED ORDER — PANTOPRAZOLE SODIUM 20 MG PO TBEC: 20.0000 mg | DELAYED_RELEASE_TABLET | Freq: Two times a day (BID) | ORAL | Status: DC

## 2014-05-26 MED ORDER — PROMETHAZINE HCL 50 MG PO TABS: 25.0000 mg | ORAL_TABLET | Freq: Four times a day (QID) | ORAL | Status: DC | PRN

## 2014-05-26 NOTE — Progress Notes (Signed)
   Subjective:    [name], female    DOB: 05/05/1960, 54 y.o.   MRN: [id number]  HPI 54 yo F with hx of Hep C (tx at [facility] completed 04-2013, repeat Hep C RNA -) and HIV+ (prev LXVr/TRV). She had nasal septoplasty 01-05-10. She was previously changed to [employer] in 2013. She was then changed to TRV/DTGV in anticipation of starting Hep C treatment.  She developed a rash on this and was then changed to ISN/TRV (which she takes with hydroxizine due to pruritis, rash).  She was changed to ATVc/TRV which her rash persisted on, then was taken off ART. Was seen December 2015 and restarted ART due to low CD4. Now on DRVr/TRV. Having muscle aches and cramps. Pain in L calf, radiates into her foot. Having abd cramps, takes her breath away. having 5-6x/day. Was taking valium previously (helps her rest but does not alter pain), worse at night.   Has been taking zofran with her ART to help with nausea. Has been taking zofran for years.   More congested, taking multiple anti-histamines.  Was seen by PMR given rx for robaxin but has not gotten yet.   HIV 1 RNA QUANT (copies/mL)  Date Value  03/17/2014 26300*  11/05/2013 <20  05/09/2013 <20   CD4 T CELL ABS (/uL)  Date Value  03/17/2014 460  11/05/2013 900  05/09/2013 800   Review of Systems  Constitutional: Negative for fever and chills.  Musculoskeletal: Positive for myalgias.      Objective:   Physical Exam  Constitutional: She appears well-developed and well-nourished.  HENT:  Mouth/Throat: No oropharyngeal exudate.  Eyes: EOM are normal. Pupils are equal, round, and reactive to light.  Neck: Neck supple.  Cardiovascular: Normal rate, regular rhythm and normal heart sounds.   Pulmonary/Chest: Effort normal and breath sounds normal.  Abdominal: Soft. Bowel sounds are normal. She exhibits distension. There is no tenderness.  Musculoskeletal:       Legs: Lymphadenopathy:    She has no cervical adenopathy.            Assessment & Plan:

## 2014-05-26 NOTE — Assessment & Plan Note (Addendum)
Will refill her valium Check her CK Await start of robaxin.

## 2014-05-26 NOTE — Assessment & Plan Note (Signed)
Will change her ART to HS.  Will try phenergan (instead of zofran) to see if that helps.  Will see her back in 1 month.

## 2014-05-26 NOTE — Assessment & Plan Note (Signed)
Appreciate Allergy f/u.

## 2014-05-27 ENCOUNTER — Ambulatory Visit (HOSPITAL_COMMUNITY)
Admission: RE | Admit: 2014-05-27 | Discharge: 2014-05-27 | Disposition: A | Discharge status: Home / Self Care | Payer: Medicaid Other | Source: Ambulatory Visit | Attending: Infectious Diseases | Admitting: Infectious Diseases

## 2014-05-27 ENCOUNTER — Encounter (HOSPITAL_COMMUNITY): Payer: Medicaid Other

## 2014-05-27 ENCOUNTER — Telehealth: Payer: Self-pay | Admitting: *Deleted

## 2014-05-27 DIAGNOSIS — R252 Cramp and spasm: Secondary | ICD-10-CM | POA: Insufficient documentation

## 2014-05-27 LAB — RPR

## 2014-05-27 LAB — T-HELPER CELL (CD4) - (RCID CLINIC ONLY)
CD4 % Helper T Cell: 16 % — ABNORMAL LOW (ref 33–55)
CD4 T CELL ABS: 400 /uL (ref 400–2700)

## 2014-05-27 NOTE — Telephone Encounter (Signed)
Lower Extremity venous duplex, Left approved by insurance.  Authorization ID [id number], valid 05/26/14 - 06/25/14. Landis Gandy, RN

## 2014-05-27 NOTE — Telephone Encounter (Signed)
Scheduler notified and given authorization number.

## 2014-05-27 NOTE — Progress Notes (Signed)
Left lower extremity venous duplex completed.  Left:  No evidence of DVT, superficial thrombosis, or Baker's cyst.  Right:  Negative for DVT in the common femoral vein.  

## 2014-05-28 LAB — HIV-1 RNA ULTRAQUANT REFLEX TO GENTYP+
HIV 1 RNA QUANT: 110 {copies}/mL — AB (ref <20)
HIV-1 RNA QUANT, LOG: 2.04 {Log} — AB (ref <1.30)

## 2014-06-02 ENCOUNTER — Telehealth: Payer: Self-pay | Admitting: *Deleted

## 2014-06-02 DIAGNOSIS — K219 Gastro-esophageal reflux disease without esophagitis: Secondary | ICD-10-CM

## 2014-06-02 MED ORDER — PANTOPRAZOLE SODIUM 20 MG PO TBEC: 20.0000 mg | DELAYED_RELEASE_TABLET | Freq: Two times a day (BID) | ORAL | Status: DC

## 2014-06-02 NOTE — Telephone Encounter (Signed)
Checking on monthly quantity of Protonix rx.  #30 was recently sent in, pt on bid dosing of Protonix and needing #60 tablets.  Also checked current HIV rxes with [place].  PPA thought that the pt was on Evotaz rx.  Corrected pt's HIV profile with PPA as Prezista, Truvada and Norvir.  Corrected number of tablets of Protonix to # 60.

## 2014-06-09 ENCOUNTER — Other Ambulatory Visit: Payer: Self-pay | Admitting: Physical Medicine & Rehabilitation

## 2014-06-09 ENCOUNTER — Other Ambulatory Visit: Payer: Self-pay | Admitting: Internal Medicine

## 2014-06-13 ENCOUNTER — Other Ambulatory Visit: Payer: Self-pay | Admitting: Internal Medicine

## 2014-06-13 ENCOUNTER — Other Ambulatory Visit: Payer: Self-pay | Admitting: Physical Medicine & Rehabilitation

## 2014-06-16 NOTE — Telephone Encounter (Signed)
Called to PPA 

## 2014-06-23 ENCOUNTER — Ambulatory Visit (INDEPENDENT_AMBULATORY_CARE_PROVIDER_SITE_OTHER): Payer: Medicaid Other | Admitting: Infectious Diseases

## 2014-06-23 ENCOUNTER — Encounter: Payer: Self-pay | Admitting: Infectious Diseases

## 2014-06-23 DIAGNOSIS — R252 Cramp and spasm: Secondary | ICD-10-CM

## 2014-06-23 NOTE — Assessment & Plan Note (Signed)
The etiology of this is unclear. Her pain in her legs followed by bruising is unusual.  Will check ABI and her cryoglobulins.  Have her f/u in IM clinic.

## 2014-06-23 NOTE — Progress Notes (Signed)
   Subjective:    [name], female    DOB: 05-Aug-1960, 54 y.o.   MRN: [id number]  HPI 54 yo F with hx of Hep C (tx at [facility] completed 04-2013, repeat Hep C RNA -) and HIV+ (prev LXVr/TRV). She had nasal septoplasty 01-05-10. She was previously changed to [place] in 2013. She was then changed to TRV/DTGV in anticipation of starting Hep C treatment.  She developed a rash on this and was then changed to ISN/TRV (which she takes with hydroxizine due to pruritis, rash).  She was changed to ATVc/TRV which her rash persisted on, then was taken off ART. Was seen December 2015 and restarted ART due to low CD4. Now on DRVr/TRV.   Still having camping in her LLE. She also has these in her chest and abdomen. Has been getting bruises over sites where she had cramping. Took robaxin from her PCP and this knocked her out. She has also taken gabapentin which did not help and also knocked her out.  Also c/o nausea, difficulty with eating. Has been eating crackers, granola bars. If she takes phenergan she can eat and hold her food down. Does not want to take this every day.  Was seen 2-1 and had LE doppler (-).   She attributes all her sx to having completed Hep C treatment.    HIV 1 RNA QUANT (copies/mL)  Date Value  05/26/2014 110*  03/17/2014 26300*  11/05/2013 <20   CD4 T CELL ABS (/uL)  Date Value  05/26/2014 400  03/17/2014 460  11/05/2013 900   Review of Systems See above    Objective:   Physical Exam  Constitutional: She appears well-developed and well-nourished.  HENT:  Mouth/Throat: No oropharyngeal exudate.  Eyes: EOM are normal. Pupils are equal, round, and reactive to light.  Neck: Neck supple.  Cardiovascular: Normal rate, regular rhythm and normal heart sounds.   Pulmonary/Chest: Effort normal and breath sounds normal.  Abdominal: Soft. Bowel sounds are normal. She exhibits distension. There is no tenderness.  Musculoskeletal:  No L calf tenderness or cordis.    Lymphadenopathy:    She has no cervical adenopathy.          Assessment & Plan:

## 2014-06-24 LAB — ANCA SCREEN W REFLEX TITER
Atypical p-ANCA Screen: NEGATIVE
c-ANCA Screen: NEGATIVE
p-ANCA Screen: NEGATIVE

## 2014-06-24 LAB — ANA: Anti Nuclear Antibody(ANA): NEGATIVE

## 2014-06-26 ENCOUNTER — Ambulatory Visit (HOSPITAL_COMMUNITY)
Admission: RE | Admit: 2014-06-26 | Discharge: 2014-06-26 | Disposition: A | Discharge status: Home / Self Care | Payer: Medicaid Other | Source: Ambulatory Visit | Attending: Internal Medicine | Admitting: Internal Medicine

## 2014-06-26 ENCOUNTER — Ambulatory Visit (INDEPENDENT_AMBULATORY_CARE_PROVIDER_SITE_OTHER): Payer: Medicaid Other | Admitting: Internal Medicine

## 2014-06-26 ENCOUNTER — Encounter: Payer: Self-pay | Admitting: Internal Medicine

## 2014-06-26 VITALS — BP 140/80 | HR 88 | Temp 98.2°F | Ht 61.0 in | Wt 156.0 lb

## 2014-06-26 DIAGNOSIS — E2740 Unspecified adrenocortical insufficiency: Secondary | ICD-10-CM

## 2014-06-26 DIAGNOSIS — R252 Cramp and spasm: Secondary | ICD-10-CM | POA: Insufficient documentation

## 2014-06-26 DIAGNOSIS — E559 Vitamin D deficiency, unspecified: Secondary | ICD-10-CM

## 2014-06-26 LAB — TSH: TSH: 0.695 u[IU]/mL (ref 0.350–4.500)

## 2014-06-26 LAB — BASIC METABOLIC PANEL WITH GFR
BUN: 10 mg/dL (ref 6–23)
CO2: 24 mEq/L (ref 19–32)
CREATININE: 0.59 mg/dL (ref 0.50–1.10)
Calcium: 9 mg/dL (ref 8.4–10.5)
Chloride: 104 mEq/L (ref 96–112)
GFR, Est African American: >89 mL/min
GFR, Est Non African American: >89 mL/min
Glucose, Bld: 109 mg/dL — ABNORMAL HIGH (ref 70–99)
Potassium: 3.9 mEq/L (ref 3.5–5.3)
Sodium: 139 mEq/L (ref 135–145)

## 2014-06-26 LAB — CK TOTAL AND CKMB (NOT AT ARMC)
CK, MB: 1.1 ng/mL (ref 0.3–4.0)
Relative Index: 1 (ref 0.0–2.5)
Total CK: 115 U/L (ref 7–177)

## 2014-06-26 LAB — C-REACTIVE PROTEIN: CRP: <0.5 mg/dL (ref <0.60)

## 2014-06-26 NOTE — Patient Instructions (Addendum)
General Instructions: Please continue to take Robaxin or Benadryl at night for relief  Follow up in 2-4 weeks, sooner if needed  Please come in the morning around 8:30 to get the cortisol level  Take care    Treatment Goals:  Goals (1 Years of Data) as of 06/26/14          As of Today 06/23/14 05/26/14 05/23/14 04/14/14     Blood Pressure   . Blood Pressure < 140/90  151/88 147/90 130/84 160/98 151/92      Progress Toward Treatment Goals:  Treatment Goal 06/26/2014  Blood pressure deteriorated    Self Care Goals & Plans:  Self Care Goal 06/26/2014  Manage my medications take my medicines as prescribed; refill my medications on time; bring my medications to every visit; follow the sick day instructions if I am sick  Monitor my health keep track of my blood pressure  Eat healthy foods -  Be physically active -  Meeting treatment goals maintain the current self-care plan    No flowsheet data found.   Care Management & Community Referrals:  Referral 06/26/2014  Referrals made for care management support none needed  Referrals made to community resources none        Muscle Cramps and Spasms Muscle cramps and spasms occur when a muscle or muscles tighten and you have no control over this tightening (involuntary muscle contraction). They are a common problem and can develop in any muscle. The most common place is in the calf muscles of the leg. Both muscle cramps and muscle spasms are involuntary muscle contractions, but they also have differences:   Muscle cramps are sporadic and painful. They may last a few seconds to a quarter of an hour. Muscle cramps are often more forceful and last longer than muscle spasms.  Muscle spasms may or may not be painful. They may also last just a few seconds or much longer. CAUSES  It is uncommon for cramps or spasms to be due to a serious underlying problem. In many cases, the cause of cramps or spasms is unknown. Some common causes are:    Overexertion.   Overuse from repetitive motions (doing the same thing over and over).   Remaining in a certain position for a long period of time.   Improper preparation, form, or technique while performing a sport or activity.   Dehydration.   Injury.   Side effects of some medicines.   Abnormally low levels of the salts and ions in your blood (electrolytes), especially potassium and calcium. This could happen if you are taking water pills (diuretics) or you are pregnant.  Some underlying medical problems can make it more likely to develop cramps or spasms. These include, but are not limited to:   Diabetes.   Parkinson disease.   Hormone disorders, such as thyroid problems.   Alcohol abuse.   Diseases specific to muscles, joints, and bones.   Blood vessel disease where not enough blood is getting to the muscles.  HOME CARE INSTRUCTIONS   Stay well hydrated. Drink enough water and fluids to keep your urine clear or pale yellow.  It may be helpful to massage, stretch, and relax the affected muscle.  For tight or tense muscles, use a warm towel, heating pad, or hot shower water directed to the affected area.  If you are sore or have pain after a cramp or spasm, applying ice to the affected area may relieve discomfort.  Put ice in a plastic bag.  Place a towel between your skin and the bag.  Leave the ice on for 15-20 minutes, 03-04 times a day.  Medicines used to treat a known cause of cramps or spasms may help reduce their frequency or severity. Only take over-the-counter or prescription medicines as directed by your caregiver. SEEK MEDICAL CARE IF:  Your cramps or spasms get more severe, more frequent, or do not improve over time.  MAKE SURE YOU:   Understand these instructions.  Will watch your condition.  Will get help right away if you are not doing well or get worse. Document Released: 10/01/2001 Document Revised: 08/06/2012 Document  Reviewed: 03/28/2012 [facility] Patient Information 2015 [place], [place]. This information is not intended to replace advice given to you by your health care provider. Make sure you discuss any questions you have with your health care provider.

## 2014-06-27 ENCOUNTER — Other Ambulatory Visit (INDEPENDENT_AMBULATORY_CARE_PROVIDER_SITE_OTHER): Payer: Medicaid Other

## 2014-06-27 ENCOUNTER — Other Ambulatory Visit: Payer: Self-pay | Admitting: Internal Medicine

## 2014-06-27 DIAGNOSIS — R252 Cramp and spasm: Secondary | ICD-10-CM

## 2014-06-27 LAB — VITAMIN D 25 HYDROXY (VIT D DEFICIENCY, FRACTURES): VIT D 25 HYDROXY: 9 ng/mL — AB (ref 30–100)

## 2014-06-27 LAB — SEDIMENTATION RATE: Sed Rate: 27 mm/hr (ref 0–30)

## 2014-06-27 MED ORDER — VITAMIN D3 1.25 MG (50000 UT) PO TABS: 1.0000 | ORAL_TABLET | ORAL | Status: DC

## 2014-06-28 ENCOUNTER — Encounter: Payer: Self-pay | Admitting: Internal Medicine

## 2014-06-28 LAB — CORTISOL-AM, BLOOD: CORTISOL - AM: 1 ug/dL — AB (ref 4.3–22.4)

## 2014-06-28 LAB — CRYOGLOBULIN

## 2014-06-28 NOTE — Progress Notes (Signed)
Subjective:    [name], female    DOB: 12-Feb-1961, 54 y.o.   MRN: [id number]  HPI Comments: 54 y.o with well controlled HIV cd4 400 VL <110 as of 05/2014(followed by [name] [name] she was well controlled until ID took her off her HAART tx due to allergic reaction in 2015), hepatitis C 1b (stage 2 via bipsy 03/2012 no evidence of cirrhosis,treated at [facility] with Simeprevir and Sofosbuvir 01/2013 x 12 weeks after tx HCV undetected 06/2013), HTN , dyslipidemia (05/2014 119), allergic rhinitis, GERD, insomnia, chronic low back pain=lumbar spondylosis w/o myelopathy s/p lumbar branch blocks 6/15, 8/15, 9/15 and s/p radiofrequency ablation left and right side) x multiple times for arthritis and bulging disk (Dr. Letta Pate), postherpetic neuralgia, type 1 RTA, chronic lower extremity edema, venous insufficiency.   She presents today for f/u last seen 02/2014  1. Main complaint is significant nocturnal leg cramps starting in her legs. Worse x 1 month.  Cramps radiate from her feet to her calves to her thighs, stomach and chest.   she even noticed calf bruising and swelling. Last Sunday the patient reports left foot swelling which is not resolved and she shows a photo of this difference.  She is not overall feeling herself due to not getting sleep at night due to the cramps she didn't go to church the past 2 Sundays.  Last muscle cramp was last night.  At times she states the cramps 'take her breath away" and she is not sure if due to anxiety from pain.  She also states she has experienced chest pain (of note cardiac w/u 05/2011 negative previously saw Dr. Johnsie Cancel) from cramps in her chest which last a few min..  She tries leg elevation, compression stockings.  She cant tell if Robaxin helps b/c it puts her to sleep.  She also is trying Valium for cramps as well which she takes intermittently for anxiety and does not notice it helps with her cramps.  She has also tried iced which helps a little but  muscles still remain sore but heat no relief. She states the cramps make her body sore upon waking.  Walking makes the cramps worse.  Cramps hurt so bad they bring tears to her eyes.  [name] [name] saw her not too long ago for this complaint and he ordered lower ext dopplers 05/27/14 which were negative except lymph node in left inguinal crease, ABI still pending, ANA, ANCA, pANA, atypical ANCA all negative, and cryoglobulins still pending.  Recent bloodwork also shows RPR negative. CPK also check by [name] [name] was sl elevated 183 with upper limit normal 177.  Other labs 2/1 CMET wnl (including K), and CBC wnl.    ROS: she denies rigors and states pain due to muscle cramps is bothering the most. Denies tingling in extremities, +myalgias, +fatigue, +muscle weakness/pain in upper arms and thighs, polyuria noted last visit resolved with reduced water intake close to bedtime.  Denies rash currently but skin is itchy intermittently due to dry skin.  +decreased sleep, +slightly depressed mood due to muscle cramps and no energy to do activities.    2. HIV numbers are improving as pt has resumed Prezista, Norvir and Truvada x approx 2 months.  She is tolerating except for intermittent nausea/vomiting which she takes phenerghan.    3. H/o HTN BP 151/88 then 140/80 on Norvasc 10     Review of Systems  Constitutional: Positive for fatigue. Negative for fever and unexpected weight change.  H/o hot flashes  Gastrointestinal: Positive for nausea and vomiting. Negative for diarrhea and constipation.  Musculoskeletal: Positive for back pain. Negative for joint swelling and arthralgias.  Neurological: Negative for numbness.       Objective:   Physical Exam  Constitutional: She is oriented to person, place, and time. Vital signs are normal. She appears well-developed and well-nourished. She is cooperative. No distress.  HENT:  Head: Normocephalic and atraumatic.  Mouth/Throat: Oropharynx is clear and moist  and mucous membranes are normal. No oropharyngeal exudate.  Eyes: Conjunctivae are normal. Pupils are equal, round, and reactive to light. Right eye exhibits no discharge. Left eye exhibits no discharge. No scleral icterus.  Neck:    Cardiovascular: Normal rate, regular rhythm, S1 normal, S2 normal and normal heart sounds.   No murmur heard. No lower ext edema   Pulmonary/Chest: Effort normal and breath sounds normal. No respiratory distress. She has no wheezes.  Abdominal: Soft. Bowel sounds are normal. There is no tenderness.  Musculoskeletal:       Right shoulder: She exhibits normal strength.       Left shoulder: She exhibits normal strength.       Legs: Neurological: She is alert and oriented to person, place, and time. Gait normal.  4/5 weakness in LLE and RLE   Skin: Skin is warm, dry and intact. No rash noted. She is not diaphoretic.  Psychiatric: Her speech is normal and behavior is normal. Judgment and thought content normal. Cognition and memory are normal.  Mood sl down from prior visits   Nursing note and vitals reviewed.         Assessment & Plan:  F/u in 2-4 weeks

## 2014-06-28 NOTE — Assessment & Plan Note (Signed)
Pap smear due 07/2014  Colonoscopy 2017 Dr. Deatra Ina

## 2014-06-29 DIAGNOSIS — E2749 Other adrenocortical insufficiency: Secondary | ICD-10-CM

## 2014-06-29 DIAGNOSIS — E559 Vitamin D deficiency, unspecified: Secondary | ICD-10-CM | POA: Insufficient documentation

## 2014-06-29 HISTORY — DX: Other adrenocortical insufficiency: E27.49

## 2014-06-29 MED ORDER — DEXAMETHASONE 0.5 MG PO TABS: 0.5000 mg | ORAL_TABLET | Freq: Every day | ORAL | Status: DC

## 2014-06-29 NOTE — Assessment & Plan Note (Signed)
Per Dr. Johnnye Sima on Prezista, Norvir, Truvada and since resumed meds cd4 ct improving and VL decreasing.  Pt is compliant with meds

## 2014-06-29 NOTE — Assessment & Plan Note (Signed)
Replaced with 50K IU x 2 months  Will repeat vit d in 2 months

## 2014-06-29 NOTE — Addendum Note (Signed)
Addended by: Cresenciano Genre on: 06/29/2014 10:01 AM   Modules accepted: Level of Service

## 2014-06-29 NOTE — Assessment & Plan Note (Signed)
BP Readings from Last 3 Encounters:  06/26/14 140/80  06/23/14 147/90  05/26/14 130/84    Lab Results  Component Value Date   NA 139 06/26/2014   K 3.9 06/26/2014   CREATININE 0.59 06/26/2014    Assessment: Blood pressure control: mildly elevated Progress toward BP goal:  deteriorated Comments: none at goal initially 151/88 then repeat 140/80  Plan: Medications:  continue current medications (Norvasc 10mg ) Other plans: f/u in 2 weeks

## 2014-06-29 NOTE — Assessment & Plan Note (Addendum)
Unclear if primary or secondary or tertiary. Updated the patient  Will need further w/u if I cant not discern from further w/u will refer to endocrinology Will tx with Dexamethasone 0.5 mg qd until further w/u obtained as it does not effect further w/u testing.  [facility] to [facility] so pt can get immed. And 27 tabs to mail order pharmacy In further w/u for AI (repeat cortisol with ACTH testing or add on ACTH testing to previous blood draw if possible or ACTH stim testing; if pt cortisol low and ACTH high then primary AI and need to check Aldosterone and renin levels; if both cortisol and ACTH low secondary AI (pituitary etiology) or tertiary AI (hypothalamic)) Other considerations to w/u include ab CT to look at adrenals, antiphospholipid antibodies can be helpful when hemmorhage is present in a patient not receiving anticoagulants, checking antibodies against 21 hydoxylase (to w/u for autoimmune AI) Pt will f/u in 2 weeks

## 2014-06-29 NOTE — Assessment & Plan Note (Addendum)
Per up to date etiology of myalgias, w/u for muscle cramps includes the following w/u.  Patient is not on statin, will check vitamin D (low 9), repeat CPK (wnl trended down from few weeks ago), check TSH (wnl), ESR and CRP (normal), liver enzymes normal on 05/26/14, also etiology could be adrenal insufficiency (will check am cortisol=1 severely low am cortisol levels should be higher than any time of day and early morning ranges from 10-20 mcg/dL), will review meds to see if meds could be causing nocturnal cramps, fatigue and muscle weakness  Also will check aldolase to r/o myositis   Vitamin D level came back at 9-replaced with 50K IU weekly 9:39 am cortisol was 1=adrenal insufficiency unclear if primary or secondary. Per up to date sxs of fatigue, generalized weakness, myalgias and muscle contractures. etc can happen due to adrenal insufficiency. It may be the patient is having muscle contractures and not muscle cramps since she experiences significant pain with the "muscle cramps".  Will replace with Dexamethasone 0.5 mg qd until further w/u and see pt back in 2 weeks.   Consider endocrinology referral in further if w/u unclear if primary of secondary adrenal insufficiency

## 2014-06-30 LAB — ALDOLASE: Aldolase: 6 U/L (ref <=8.1)

## 2014-06-30 NOTE — Progress Notes (Signed)
Internal Medicine Clinic Attending  Case discussed with Dr. Aundra Dubin at the time of the visit.  We reviewed the resident's history and exam and pertinent patient test results.  I agree with the assessment, diagnosis, and plan of care documented in the resident's note.  Patient will likely need a repeat AM cortisol to confirm low as well.

## 2014-07-04 ENCOUNTER — Telehealth: Payer: Self-pay | Admitting: *Deleted

## 2014-07-04 NOTE — Telephone Encounter (Signed)
Pt called left leg swollen. Pt is keeping leg up as much as possible. Left leg ? Sl red last PM - denies now. Pt is unable to come to [facility] today - appt made 07/07/14 9:45AM Dr Ethelene Hal. If any change suggest to go to ER. Hilda Blades Rage Beever RN 07/04/14 11:15AM

## 2014-07-07 ENCOUNTER — Encounter: Payer: Self-pay | Admitting: Internal Medicine

## 2014-07-07 ENCOUNTER — Ambulatory Visit (INDEPENDENT_AMBULATORY_CARE_PROVIDER_SITE_OTHER): Payer: Medicaid Other | Admitting: Internal Medicine

## 2014-07-07 ENCOUNTER — Telehealth: Payer: Self-pay | Admitting: *Deleted

## 2014-07-07 VITALS — BP 155/87 | HR 101 | Temp 98.0°F | Ht 61.0 in | Wt 160.3 lb

## 2014-07-07 DIAGNOSIS — E2740 Unspecified adrenocortical insufficiency: Secondary | ICD-10-CM

## 2014-07-07 MED ORDER — VITAMIN D3 1.25 MG (50000 UT) PO TABS: 1.0000 | ORAL_TABLET | ORAL | Status: DC

## 2014-07-07 NOTE — Progress Notes (Signed)
   Subjective:    [name], female    DOB: [birth year], 54 y.o.   MRN: [id number]  HPI  Ms Buitron is a 54 year old woman with HIV, HTN, RTA type 1, venous insufficiency who presents with L leg swelling. She has been seen in the past for nocturnal leg cramps (last seen 06/26/14). She had LLE Doppler without DVT but noted enlarged lymph node in the L inguinal crease. ABI was ordered as well but not yet performed. Her cortisol level was low and she was started on dexamethasone 0.5 mg daily on 3/6.   Since she was seen on 3/3, she thinks that it has improved some but she still has some cramps. She says she has less cramps than usual. She now has swelling in her legs though that was not there before. The LE swelling is worse in the L than the R. She says every day it slowly swells throughout the day. It goes down when she elevates it with sleep. She has compression stockings at home but says she has not tried them.  Review of Systems  Constitutional: Negative for fever, chills and diaphoresis.  Respiratory: Negative for shortness of breath.   Cardiovascular: Positive for leg swelling. Negative for chest pain.  Gastrointestinal: Negative for nausea, vomiting, abdominal pain, diarrhea and constipation.  Musculoskeletal: Positive for myalgias.  Neurological: Negative for dizziness, weakness, numbness and headaches.       Objective:   Physical Exam  Constitutional: She is oriented to person, place, and time. She appears well-developed and well-nourished. No distress.  HENT:  Head: Normocephalic and atraumatic.  Mouth/Throat: Oropharynx is clear and moist.  Eyes: EOM are normal. Pupils are equal, round, and reactive to light.  Cardiovascular: Normal rate, regular rhythm, normal heart sounds and intact distal pulses.  Exam reveals no gallop and no friction rub.   No murmur heard. Pulmonary/Chest: Effort normal and breath sounds normal. No respiratory distress. She has no wheezes.    Abdominal: Soft. Bowel sounds are normal. She exhibits no distension. There is no tenderness.  Musculoskeletal: She exhibits no edema.  No edema in either leg, 2+ pulses, strength and sensation intact.   Neurological: She is alert and oriented to person, place, and time.  Skin: She is not diaphoretic.  Vitals reviewed.         Assessment & Plan:

## 2014-07-07 NOTE — Telephone Encounter (Signed)
Call from [name] with Dr. Claris Gladden office; patient was suppose to be scheduled for an ankle brachial test at last office visit with Dr. Johnnye Sima. Order sent to [facility], for prior auth. Once it is approved by patient's insurance; the test will be scheduled and patient will be notified. [name] [name]

## 2014-07-07 NOTE — Patient Instructions (Addendum)
It was a pleasure to see you today. We have given you a referral to endocrinology for your adrenal insufficiency. Continue the dexamethasone daily and the vitamin D weekly in the meantime. Please return to clinic or seek medical attention if you have any new or worsening leg swelling, leg pain, or other worrisome medical condition. We look forward to seeing you again as needed.  Lottie Mussel, MD  General Instructions:   Please try to bring all your medicines next time. This will help [place] keep you safe from mistakes.   Progress Toward Treatment Goals:  Treatment Goal 07/07/2014  Blood pressure deteriorated    Self Care Goals & Plans:  Self Care Goal 07/07/2014  Manage my medications take my medicines as prescribed; bring my medications to every visit; refill my medications on time  Monitor my health keep track of my blood pressure  Eat healthy foods -  Be physically active -  Meeting treatment goals -    No flowsheet data found.   Care Management & Community Referrals:  Referral 06/26/2014  Referrals made for care management support none needed  Referrals made to community resources none

## 2014-07-07 NOTE — Assessment & Plan Note (Addendum)
[name] [name]'s LE swelling is likely secondary to her new exogenous steroid. She had a recent work-up for her leg cramping which revealed low am cortisol of 1 suggesting adrenal insufficiency. She was started on dexamethasone 0.5 mg daily on 3/6 and shortly after developed the leg swelling. She also had low vitamin d and started on weekly supplementation. She says that her cramping has improved but the swelling is troublesome. While her legs looked normal on exam, she showed a picture on her phone which demonstrated impressive swelling of her foot and ankle. She has compression stockings at home but has not tried them yet. Previous LLE Doppler negative for DVT and ABIs already ordered. While her lab suggests adrenal insufficiency, she does have hypertension. -referral to endocrinology re adrenal insufficiency -continue dexamethasone 0.5 mg daily pending endo visit -compression stockings as needed -ABI as previously ordered

## 2014-07-07 NOTE — Progress Notes (Signed)
Case discussed with Dr. Rothman at the time of the visit. We reviewed the resident's history and exam and pertinent patient test results. I agree with the assessment, diagnosis, and plan of care documented in the resident's note. 

## 2014-07-09 ENCOUNTER — Other Ambulatory Visit: Payer: Self-pay | Admitting: Internal Medicine

## 2014-07-11 ENCOUNTER — Other Ambulatory Visit: Payer: Self-pay | Admitting: Infectious Diseases

## 2014-07-11 ENCOUNTER — Other Ambulatory Visit: Payer: Self-pay | Admitting: Internal Medicine

## 2014-07-11 DIAGNOSIS — M545 Low back pain: Principal | ICD-10-CM

## 2014-07-11 DIAGNOSIS — G8929 Other chronic pain: Secondary | ICD-10-CM

## 2014-07-15 ENCOUNTER — Telehealth: Payer: Self-pay | Admitting: *Deleted

## 2014-07-15 NOTE — Telephone Encounter (Signed)
Called patient and notified her of appt at [facility] for 07/25/14 at 9:45 AM. This has been prior authorized through her insurance; [id number]. She is having an ABI (limited bilateral noninvasive physiologic study of bilateral lower arteries. Myrtis Hopping

## 2014-07-23 ENCOUNTER — Encounter: Payer: Medicaid Other | Admitting: Internal Medicine

## 2014-07-25 ENCOUNTER — Ambulatory Visit (HOSPITAL_COMMUNITY)
Admission: RE | Admit: 2014-07-25 | Discharge: 2014-07-25 | Disposition: A | Discharge status: Home / Self Care | Payer: Medicaid Other | Source: Ambulatory Visit | Attending: Infectious Diseases | Admitting: Infectious Diseases

## 2014-07-25 DIAGNOSIS — M79606 Pain in leg, unspecified: Secondary | ICD-10-CM | POA: Diagnosis present

## 2014-07-25 DIAGNOSIS — R252 Cramp and spasm: Secondary | ICD-10-CM | POA: Diagnosis not present

## 2014-07-25 NOTE — Progress Notes (Signed)
VASCULAR LAB PRELIMINARY  ARTERIAL  ABI completed:    RIGHT    LEFT    PRESSURE WAVEFORM  PRESSURE WAVEFORM  BRACHIAL 162 triphasic BRACHIAL 148 triphasic  DP 163 triphasic DP 156 triphasic  AT 168 triphasic AT 155 triphasic  PT 158 triphasic PT 171 triphasic     PER    GREAT TOE 165 monophasic GREAT TOE 135 monophasic    RIGHT LEFT  ABI 1.01 1.06     August Albino, RVT 07/25/2014, 10:36 AM

## 2014-07-28 ENCOUNTER — Encounter: Payer: Self-pay | Admitting: Internal Medicine

## 2014-07-28 ENCOUNTER — Ambulatory Visit (INDEPENDENT_AMBULATORY_CARE_PROVIDER_SITE_OTHER): Payer: Medicaid Other | Admitting: Internal Medicine

## 2014-07-28 VITALS — BP 153/87 | HR 98 | Temp 98.2°F | Ht 61.0 in | Wt 164.4 lb

## 2014-07-28 DIAGNOSIS — E559 Vitamin D deficiency, unspecified: Secondary | ICD-10-CM

## 2014-07-28 DIAGNOSIS — E2740 Unspecified adrenocortical insufficiency: Secondary | ICD-10-CM

## 2014-07-28 DIAGNOSIS — R252 Cramp and spasm: Secondary | ICD-10-CM

## 2014-07-28 MED ORDER — HYDROCORTISONE 5 MG PO TABS: 5.0000 mg | ORAL_TABLET | Freq: Two times a day (BID) | ORAL | Status: DC

## 2014-07-28 NOTE — Assessment & Plan Note (Signed)
She has been presumptively diagnosed with adrenal insufficiency which was found as part of a workup for muscle cramping based on an low AM cortisol level.  She does have a posterior fat pad but does not seem to be hypoglycemic or hypotensive.  She is actually hypertensive.  Furthermore this does not seem to be effective in treating her muscle cramping. -Will change dexamethasone to hydrocortisone, will reduce dose to 5mg  BID and have her continue this for 2 weeks with plan for follow up at that time.  I have instructed her to reduce that further to 5mg  QAM at that time.

## 2014-07-28 NOTE — Progress Notes (Signed)
[facility] Subjective:   Patient ID: KALEISHA BHARGAVA female   DOB: 02-16-1961 54 y.o.   MRN: [id number]  HPI: Ms.Jennetta L Gumbs is a 54 y.o. female with a PMH detailed below who presents for follow up for her recent issues of nocturnal leg cramping and swelling of her legs. She recently was seen for nocturnal cramping of her legs, a workup for this apparently revealed possible adrenal insufficiency (9:40am cortisol of 1), she was started on dexamethasone 0.5mg  daily and has since continued to have muscle cramping as well as a new symptom of some leg swelling.   Today she reports that she has continued to have some leg swelling although it has improved from her previous visit.  She was told this was due to the steroids and has improved some with leg elevation.    Past Medical History  Diagnosis Date  . HIV infection     1994  . Hepatitis C     1b; 02/2012 viral quant [id number]  . Hypertension   . GERD (gastroesophageal reflux disease)   . Allergy   . Depression   . CHF (congestive heart failure)   . Diverticulitis   . Arthritis   . Chronic back pain   . Hyperlipidemia   . Muscle cramps at night   . History of shingles    Current Outpatient Prescriptions  Medication Sig Dispense Refill  . amLODipine (NORVASC) 10 MG tablet TAKE 1 TABLET BY MOUTH EVERY DAY 90 tablet 2  . cetirizine (ZYRTEC) 10 MG tablet TAKE 1 TABLET BY MOUTH ONCE DAILY AS NEEDED FOR ALLERGIES (Patient not taking: Reported on 06/28/2014) 90 tablet 1  . Cholecalciferol (VITAMIN D3) 50000 UNITS TABS Take 1 tablet by mouth once a week. 9 tablet 0  . diazepam (VALIUM) 5 MG tablet Take 1 tablet (5 mg total) by mouth at bedtime as needed. 30 tablet 2  . DIPHENHIST 25 MG tablet TAKE 1-2 TABLETS BY MOUTH EVERY NIGHT AT BEDTIME AS NEEDED FOR ITCHING (Patient not taking: Reported on 05/26/2014) 20 tablet 0  . emtricitabine-tenofovir (TRUVADA) 200-300 MG per tablet Take 1 tablet by mouth daily. 30 tablet 3  .  fluticasone (FLONASE) 50 MCG/ACT nasal spray INSTILL 2 SPRAYS IN EACH NOSTRIL DAILY AS NEEDED 16 g 2  . gabapentin (NEURONTIN) 600 MG tablet Take 1 tablet (600 mg total) by mouth 3 (three) times daily. (Patient not taking: Reported on 06/23/2014) 3 tablet 1  . hydrocortisone (CORTEF) 5 MG tablet Take 1 tablet (5 mg total) by mouth 2 (two) times daily. 45 tablet 0  . hydrOXYzine (ATARAX/VISTARIL) 25 MG tablet TAKE 1 TABLET BY MOUTH EVERY 8 HOURS AS NEEDED FOR ITCHING (Patient not taking: Reported on 05/26/2014) 60 tablet 2  . methocarbamol (ROBAXIN) 500 MG tablet TAKE 1 TABLET BY MOUTH EVERY 8 HOURS AS NEEDED FOR MUSCLE SPASMS 90 tablet 3  . mometasone (ELOCON) 0.1 % ointment Apply topically daily.    . montelukast (SINGULAIR) 10 MG tablet TAKE 1 TABLET BY MOUTH EVERY DAY 90 tablet 1  . naproxen (NAPROSYN) 500 MG tablet Take 1 tablet (500 mg total) by mouth 2 (two) times daily as needed for moderate pain (Take with food). 60 tablet 11  . NORVIR 100 MG TABS tablet TAKE 1 TABLET BY MOUTH EVERY DAY WITH BREAKFAST 30 tablet 2  . Olopatadine HCl (PATADAY) 0.2 % SOLN Apply 1 drop to eye daily as needed. For allergies     . pantoprazole (PROTONIX) 20 MG tablet  Take 1 tablet (20 mg total) by mouth 2 (two) times daily. 60 tablet 5  . Potassium Chloride 40 MEQ/15ML (20%) SOLN TAKE 15 ML BY MOUTH EVERY MORNING 473 mL 1  . PREZISTA 800 MG tablet TAKE 1 TABLET BY MOUTH EVERY DAY WITH BREAKFAST 30 tablet 2  . PROAIR HFA 108 (90 BASE) MCG/ACT inhaler INHALE 1-2 PUFFS BY MOUTH INTO THE LUNGS EVERY 6 HOURS AS NEEDED 8.5 g 2  . promethazine (PHENERGAN) 50 MG tablet TAKE 1/2 TABLET BY MOUTH EVERY 6 HOURS AS NEEDED FOR NAUSEA OR VOMITING 30 tablet 2  . traMADol (ULTRAM) 50 MG tablet TAKE 2 TABLETS BY MOUTH EVERY 4-6 HOURS AS NEEDED FOR PAIN ; MUST LAST 30 DAYS 90 tablet 1  . [DISCONTINUED] hydrochlorothiazide (HYDRODIURIL) 25 MG tablet Take 25 mg by mouth daily.     No current facility-administered medications for this  visit.   Family History  Problem Relation Age of Onset  . Heart disease Mother   . Diabetes Mother   . Stroke Mother   . Heart disease Father   . Stroke Father   . Diabetes Father   . Hepatitis Sister     hcv  . Stroke Other   . Colon polyps Brother   . Cancer Sister     lung   History   Social History  . Marital Status: Single    Spouse Name: N/A  . Number of Children: 0  . Years of Education: N/A   Social History Main Topics  . Smoking status: Former Smoker    Types: Cigarettes    Quit date: 04/26/2007  . Smokeless tobacco: Never Used     Comment: QUIT 2009  . Alcohol Use: No  . Drug Use: No  . Sexual Activity:    Partners: Male     Comment: pt. given condoms   Other Topics Concern  . None   Social History Narrative   Alternate # [phone]   Review of Systems: Review of Systems  Constitutional: Negative for fever, chills and malaise/fatigue.  Eyes: Negative for blurred vision.  Respiratory: Negative for shortness of breath.   Cardiovascular: Positive for leg swelling. Negative for chest pain.  Gastrointestinal: Negative for heartburn and abdominal pain.  Neurological: Negative for tingling and headaches.  Psychiatric/Behavioral: Negative for depression.    Objective:  Physical Exam: Filed Vitals:   07/28/14 1407  BP: 153/87  Pulse: 98  Temp: 98.2 F (36.8 C)  TempSrc: Oral  Height: 5\' 1"  (1.549 m)  Weight: 164 lb 6.4 oz (74.571 kg)  SpO2: 100%  Physical Exam  Constitutional: She is oriented to person, place, and time and well-developed, well-nourished, and in no distress.  HENT:  Head: Normocephalic and atraumatic.  Cardiovascular: Normal rate and regular rhythm.   Pulmonary/Chest: Effort normal and breath sounds normal.  Abdominal: Soft. Bowel sounds are normal.  Musculoskeletal: She exhibits edema (trace bilateral).  Neurological: She is alert and oriented to person, place, and time. She displays normal reflexes.  Psychiatric: Affect  normal.  Nursing note and vitals reviewed.   Assessment & Plan:  Case discussed with Dr. Eppie Gibson  Adrenal insufficiency She has been presumptively diagnosed with adrenal insufficiency which was found as part of a workup for muscle cramping based on an low AM cortisol level.  She does have a posterior fat pad but does not seem to be hypoglycemic or hypotensive.  She is actually hypertensive.  Furthermore this does not seem to be effective in treating her muscle cramping. -  Will change dexamethasone to hydrocortisone, will reduce dose to 5mg  BID and have her continue this for 2 weeks with plan for follow up at that time.  I have instructed her to reduce that further to 5mg  QAM at that time.    Muscle cramp, nocturnal - She does have vitamin D deficiency which is currently being repleated. Other workup so far has been negative other than for possible adrenal insufficiency. - Will check a magnesium level.     Medications Ordered Meds ordered this encounter  Medications  . hydrocortisone (CORTEF) 5 MG tablet    Sig: Take 1 tablet (5 mg total) by mouth 2 (two) times daily.    Dispense:  45 tablet    Refill:  0    Twice a day for two weeks then once a day   Other Orders Orders Placed This Encounter  Procedures  . Magnesium

## 2014-07-28 NOTE — Patient Instructions (Signed)
General Instructions: I want you to take Hydrocortisone 5mg  twice a day for 2 weeks then you can drop to once a day but you will need to be seen in clinic first.   Please bring your medicines with you each time you come to clinic.  Medicines may include prescription medications, over-the-counter medications, herbal remedies, eye drops, vitamins, or other pills.   Progress Toward Treatment Goals:  Treatment Goal 07/07/2014  Blood pressure deteriorated    Self Care Goals & Plans:  Self Care Goal 07/28/2014  Manage my medications bring my medications to every visit; refill my medications on time; take my medicines as prescribed  Monitor my health keep track of my blood pressure  Eat healthy foods -  Be physically active -  Meeting treatment goals -    No flowsheet data found.   Care Management & Community Referrals:  Referral 06/26/2014  Referrals made for care management support none needed  Referrals made to community resources none

## 2014-07-28 NOTE — Assessment & Plan Note (Signed)
-   She does have vitamin D deficiency which is currently being repleated. Other workup so far has been negative other than for possible adrenal insufficiency. - Will check a magnesium level.

## 2014-07-29 ENCOUNTER — Encounter: Payer: Self-pay | Admitting: Internal Medicine

## 2014-07-29 LAB — MAGNESIUM: Magnesium: 2 mg/dL (ref 1.5–2.5)

## 2014-07-29 NOTE — Progress Notes (Signed)
Case discussed with Dr. Hoffman at time of visit. We reviewed the resident's history and exam and pertinent patient test results. I agree with the assessment, diagnosis, and plan of care documented in the resident's note. 

## 2014-08-04 ENCOUNTER — Inpatient Hospital Stay (HOSPITAL_COMMUNITY)
Admission: RE | Admit: 2014-08-04 | Discharge: 2014-08-11 | DRG: 643 | Disposition: A | Discharge status: Home / Self Care | Payer: Medicaid Other | Source: Ambulatory Visit | Attending: Oncology | Admitting: Oncology

## 2014-08-04 ENCOUNTER — Encounter (HOSPITAL_COMMUNITY): Payer: Self-pay

## 2014-08-04 ENCOUNTER — Encounter: Payer: Self-pay | Admitting: Internal Medicine

## 2014-08-04 ENCOUNTER — Ambulatory Visit (INDEPENDENT_AMBULATORY_CARE_PROVIDER_SITE_OTHER): Payer: Medicaid Other | Admitting: Internal Medicine

## 2014-08-04 ENCOUNTER — Telehealth: Payer: Self-pay | Admitting: *Deleted

## 2014-08-04 VITALS — BP 148/82 | HR 105 | Temp 98.2°F | Ht 61.0 in | Wt 160.7 lb

## 2014-08-04 DIAGNOSIS — Z87891 Personal history of nicotine dependence: Secondary | ICD-10-CM

## 2014-08-04 DIAGNOSIS — R109 Unspecified abdominal pain: Secondary | ICD-10-CM | POA: Diagnosis present

## 2014-08-04 DIAGNOSIS — E559 Vitamin D deficiency, unspecified: Secondary | ICD-10-CM | POA: Diagnosis present

## 2014-08-04 DIAGNOSIS — F419 Anxiety disorder, unspecified: Secondary | ICD-10-CM | POA: Diagnosis present

## 2014-08-04 DIAGNOSIS — R110 Nausea: Secondary | ICD-10-CM | POA: Diagnosis not present

## 2014-08-04 DIAGNOSIS — Z0000 Encounter for general adult medical examination without abnormal findings: Secondary | ICD-10-CM

## 2014-08-04 DIAGNOSIS — G8929 Other chronic pain: Secondary | ICD-10-CM | POA: Diagnosis present

## 2014-08-04 DIAGNOSIS — I10 Essential (primary) hypertension: Secondary | ICD-10-CM | POA: Diagnosis present

## 2014-08-04 DIAGNOSIS — Z809 Family history of malignant neoplasm, unspecified: Secondary | ICD-10-CM

## 2014-08-04 DIAGNOSIS — E2740 Unspecified adrenocortical insufficiency: Secondary | ICD-10-CM

## 2014-08-04 DIAGNOSIS — J309 Allergic rhinitis, unspecified: Secondary | ICD-10-CM | POA: Diagnosis present

## 2014-08-04 DIAGNOSIS — R252 Cramp and spasm: Secondary | ICD-10-CM | POA: Diagnosis not present

## 2014-08-04 DIAGNOSIS — R1031 Right lower quadrant pain: Secondary | ICD-10-CM | POA: Diagnosis not present

## 2014-08-04 DIAGNOSIS — K5730 Diverticulosis of large intestine without perforation or abscess without bleeding: Secondary | ICD-10-CM | POA: Diagnosis present

## 2014-08-04 DIAGNOSIS — N2589 Other disorders resulting from impaired renal tubular function: Secondary | ICD-10-CM | POA: Diagnosis present

## 2014-08-04 DIAGNOSIS — M545 Low back pain: Secondary | ICD-10-CM | POA: Diagnosis present

## 2014-08-04 DIAGNOSIS — I509 Heart failure, unspecified: Secondary | ICD-10-CM | POA: Diagnosis present

## 2014-08-04 DIAGNOSIS — Z885 Allergy status to narcotic agent status: Secondary | ICD-10-CM

## 2014-08-04 DIAGNOSIS — R739 Hyperglycemia, unspecified: Secondary | ICD-10-CM | POA: Diagnosis present

## 2014-08-04 DIAGNOSIS — E2749 Other adrenocortical insufficiency: Principal | ICD-10-CM | POA: Diagnosis present

## 2014-08-04 DIAGNOSIS — R1011 Right upper quadrant pain: Secondary | ICD-10-CM

## 2014-08-04 DIAGNOSIS — Z888 Allergy status to other drugs, medicaments and biological substances status: Secondary | ICD-10-CM

## 2014-08-04 DIAGNOSIS — Z8249 Family history of ischemic heart disease and other diseases of the circulatory system: Secondary | ICD-10-CM

## 2014-08-04 DIAGNOSIS — D352 Benign neoplasm of pituitary gland: Secondary | ICD-10-CM | POA: Diagnosis present

## 2014-08-04 DIAGNOSIS — F1120 Opioid dependence, uncomplicated: Secondary | ICD-10-CM | POA: Diagnosis present

## 2014-08-04 DIAGNOSIS — D497 Neoplasm of unspecified behavior of endocrine glands and other parts of nervous system: Secondary | ICD-10-CM | POA: Diagnosis present

## 2014-08-04 DIAGNOSIS — E876 Hypokalemia: Secondary | ICD-10-CM | POA: Diagnosis not present

## 2014-08-04 DIAGNOSIS — M62838 Other muscle spasm: Secondary | ICD-10-CM | POA: Diagnosis present

## 2014-08-04 DIAGNOSIS — E099 Drug or chemical induced diabetes mellitus without complications: Secondary | ICD-10-CM | POA: Diagnosis present

## 2014-08-04 DIAGNOSIS — R5383 Other fatigue: Secondary | ICD-10-CM | POA: Diagnosis present

## 2014-08-04 DIAGNOSIS — B20 Human immunodeficiency virus [HIV] disease: Secondary | ICD-10-CM | POA: Diagnosis present

## 2014-08-04 DIAGNOSIS — B370 Candidal stomatitis: Secondary | ICD-10-CM | POA: Diagnosis present

## 2014-08-04 DIAGNOSIS — K219 Gastro-esophageal reflux disease without esophagitis: Secondary | ICD-10-CM | POA: Diagnosis present

## 2014-08-04 DIAGNOSIS — B1920 Unspecified viral hepatitis C without hepatic coma: Secondary | ICD-10-CM | POA: Diagnosis present

## 2014-08-04 DIAGNOSIS — T380X5A Adverse effect of glucocorticoids and synthetic analogues, initial encounter: Secondary | ICD-10-CM | POA: Diagnosis present

## 2014-08-04 DIAGNOSIS — M47817 Spondylosis without myelopathy or radiculopathy, lumbosacral region: Secondary | ICD-10-CM | POA: Diagnosis present

## 2014-08-04 DIAGNOSIS — Z79899 Other long term (current) drug therapy: Secondary | ICD-10-CM

## 2014-08-04 DIAGNOSIS — Z886 Allergy status to analgesic agent status: Secondary | ICD-10-CM

## 2014-08-04 HISTORY — DX: Reserved for inherently not codable concepts without codable children: IMO0001

## 2014-08-04 HISTORY — DX: Procedure and treatment not carried out because of patient's decision for reasons of belief and group pressure: Z53.1

## 2014-08-04 LAB — POCT URINALYSIS DIPSTICK
Blood, UA: NEGATIVE
Glucose, UA: NEGATIVE
LEUKOCYTES UA: NEGATIVE
Nitrite, UA: NEGATIVE
PROTEIN UA: 100
Spec Grav, UA: 1.025
UROBILINOGEN UA: 1
pH, UA: 6

## 2014-08-04 LAB — URINALYSIS, ROUTINE W REFLEX MICROSCOPIC
BILIRUBIN URINE: NEGATIVE
Glucose, UA: NEGATIVE mg/dL
Hgb urine dipstick: NEGATIVE
Ketones, ur: NEGATIVE mg/dL
Leukocytes, UA: NEGATIVE
NITRITE: NEGATIVE
PH: 6.5 (ref 5.0–8.0)
Protein, ur: NEGATIVE mg/dL
Specific Gravity, Urine: <1.005 — ABNORMAL LOW (ref 1.005–1.030)
UROBILINOGEN UA: 0.2 mg/dL (ref 0.0–1.0)

## 2014-08-04 LAB — CBC WITH DIFFERENTIAL/PLATELET
BASOS ABS: 0 10*3/uL (ref 0.0–0.1)
Basophils Relative: 0 % (ref 0–1)
EOS ABS: 0.1 10*3/uL (ref 0.0–0.7)
Eosinophils Relative: 1 % (ref 0–5)
HCT: 43.1 % (ref 36.0–46.0)
Hemoglobin: 14.1 g/dL (ref 12.0–15.0)
Lymphocytes Relative: 22 % (ref 12–46)
Lymphs Abs: 2.2 10*3/uL (ref 0.7–4.0)
MCH: 26.2 pg (ref 26.0–34.0)
MCHC: 32.7 g/dL (ref 30.0–36.0)
MCV: 80.1 fL (ref 78.0–100.0)
Monocytes Absolute: 0.7 10*3/uL (ref 0.1–1.0)
Monocytes Relative: 7 % (ref 3–12)
NEUTROS PCT: 70 % (ref 43–77)
Neutro Abs: 7.2 10*3/uL (ref 1.7–7.7)
PLATELETS: 223 10*3/uL (ref 150–400)
RBC: 5.38 MIL/uL — AB (ref 3.87–5.11)
RDW: 17.4 % — AB (ref 11.5–15.5)
WBC: 10.1 10*3/uL (ref 4.0–10.5)

## 2014-08-04 LAB — RAPID URINE DRUG SCREEN, HOSP PERFORMED
AMPHETAMINES: NOT DETECTED
Barbiturates: NOT DETECTED
Benzodiazepines: NOT DETECTED
Cocaine: NOT DETECTED
Opiates: NOT DETECTED
TETRAHYDROCANNABINOL: POSITIVE — AB

## 2014-08-04 LAB — COMPREHENSIVE METABOLIC PANEL
ALBUMIN: 3.5 g/dL (ref 3.5–5.2)
ALT: 35 U/L (ref 0–35)
AST: 19 U/L (ref 0–37)
Alkaline Phosphatase: 96 U/L (ref 39–117)
Anion gap: 11 (ref 5–15)
BUN: 12 mg/dL (ref 6–23)
CALCIUM: 8.8 mg/dL (ref 8.4–10.5)
CO2: 27 mmol/L (ref 19–32)
CREATININE: 0.8 mg/dL (ref 0.50–1.10)
Chloride: 98 mmol/L (ref 96–112)
GFR calc Af Amer: >90 mL/min (ref >90)
GFR calc non Af Amer: 83 mL/min — ABNORMAL LOW (ref >90)
Glucose, Bld: 110 mg/dL — ABNORMAL HIGH (ref 70–99)
Potassium: 3.2 mmol/L — ABNORMAL LOW (ref 3.5–5.1)
Sodium: 136 mmol/L (ref 135–145)
Total Bilirubin: 0.6 mg/dL (ref 0.3–1.2)
Total Protein: 7.7 g/dL (ref 6.0–8.3)

## 2014-08-04 LAB — LACTIC ACID, PLASMA: LACTIC ACID, VENOUS: 0.8 mmol/L (ref 0.5–2.0)

## 2014-08-04 LAB — TROPONIN I

## 2014-08-04 LAB — LIPASE, BLOOD: Lipase: 74 U/L — ABNORMAL HIGH (ref 11–59)

## 2014-08-04 MED ORDER — PANTOPRAZOLE SODIUM 40 MG IV SOLR: 40.0000 mg | INTRAVENOUS | Status: DC

## 2014-08-04 MED ORDER — TRAMADOL HCL 50 MG PO TABS
50.0000 mg | ORAL_TABLET | Freq: Four times a day (QID) | ORAL | Status: DC | PRN
Start: 2014-08-04 — End: 2014-08-11
  Administered 2014-08-05 – 2014-08-11 (×12): 50 mg via ORAL
  Filled 2014-08-04 (×13): qty 1

## 2014-08-04 MED ORDER — FLUTICASONE PROPIONATE 50 MCG/ACT NA SUSP
2.0000 | Freq: Every day | NASAL | Status: DC
  Filled 2014-08-04: qty 16

## 2014-08-04 MED ORDER — HYDROCORTISONE 5 MG PO TABS
5.0000 mg | ORAL_TABLET | Freq: Two times a day (BID) | ORAL | Status: DC
  Filled 2014-08-04: qty 1

## 2014-08-04 MED ORDER — EMTRICITABINE-TENOFOVIR DF 200-300 MG PO TABS: 1.0000 | ORAL_TABLET | Freq: Every day | ORAL | Status: DC

## 2014-08-04 MED ORDER — ALBUTEROL SULFATE HFA 108 (90 BASE) MCG/ACT IN AERS: 1.0000 | INHALATION_SPRAY | Freq: Four times a day (QID) | RESPIRATORY_TRACT | Status: DC | PRN

## 2014-08-04 MED ORDER — HYDROCORTISONE 5 MG PO TABS
5.0000 mg | ORAL_TABLET | Freq: Two times a day (BID) | ORAL | Status: DC
  Administered 2014-08-05: 5 mg via ORAL
  Filled 2014-08-04 (×2): qty 1

## 2014-08-04 MED ORDER — NAPROXEN 250 MG PO TABS
500.0000 mg | ORAL_TABLET | Freq: Two times a day (BID) | ORAL | Status: DC | PRN
  Administered 2014-08-05 – 2014-08-07 (×2): 500 mg via ORAL
  Filled 2014-08-04 (×3): qty 2

## 2014-08-04 MED ORDER — DARUNAVIR ETHANOLATE 800 MG PO TABS
800.0000 mg | ORAL_TABLET | Freq: Every day | ORAL | Status: DC
  Administered 2014-08-05 – 2014-08-10 (×6): 800 mg via ORAL
  Filled 2014-08-04 (×7): qty 1

## 2014-08-04 MED ORDER — POTASSIUM CHLORIDE 40 MEQ/15ML (20%) PO SOLN: 15.0000 mL | Freq: Every morning | ORAL | Status: DC

## 2014-08-04 MED ORDER — POTASSIUM CHLORIDE 20 MEQ/15ML (10%) PO SOLN
40.0000 meq | Freq: Once | ORAL | Status: AC
  Administered 2014-08-04: 40 meq via ORAL
  Filled 2014-08-04: qty 30

## 2014-08-04 MED ORDER — POTASSIUM CHLORIDE 20 MEQ/15ML (10%) PO SOLN
40.0000 meq | Freq: Every day | ORAL | Status: DC
  Administered 2014-08-05 – 2014-08-11 (×7): 40 meq via ORAL
  Filled 2014-08-04 (×8): qty 30

## 2014-08-04 MED ORDER — IOHEXOL 300 MG/ML  SOLN
100.0000 mL | Freq: Once | INTRAMUSCULAR | Status: AC | PRN
  Administered 2014-08-04: 100 mL via INTRAVENOUS

## 2014-08-04 MED ORDER — ENOXAPARIN SODIUM 40 MG/0.4ML ~~LOC~~ SOLN
40.0000 mg | SUBCUTANEOUS | Status: DC
  Administered 2014-08-04 – 2014-08-10 (×7): 40 mg via SUBCUTANEOUS
  Filled 2014-08-04 (×7): qty 0.4

## 2014-08-04 MED ORDER — SODIUM CHLORIDE 0.9 % IJ SOLN
3.0000 mL | Freq: Two times a day (BID) | INTRAMUSCULAR | Status: DC
  Administered 2014-08-05 – 2014-08-08 (×7): 3 mL via INTRAVENOUS

## 2014-08-04 MED ORDER — PROMETHAZINE HCL 25 MG/ML IJ SOLN
12.5000 mg | INTRAMUSCULAR | Status: DC | PRN
  Administered 2014-08-05: 25 mg via INTRAVENOUS
  Administered 2014-08-05: 12.5 mg via INTRAVENOUS
  Administered 2014-08-05 – 2014-08-11 (×24): 25 mg via INTRAVENOUS
  Filled 2014-08-04 (×25): qty 1

## 2014-08-04 MED ORDER — MONTELUKAST SODIUM 10 MG PO TABS
10.0000 mg | ORAL_TABLET | Freq: Every day | ORAL | Status: DC
  Administered 2014-08-05 – 2014-08-10 (×6): 10 mg via ORAL
  Filled 2014-08-04 (×6): qty 1

## 2014-08-04 MED ORDER — SODIUM CHLORIDE 0.9 % IV SOLN
INTRAVENOUS | Status: DC
  Administered 2014-08-04: 21:00:00 via INTRAVENOUS

## 2014-08-04 MED ORDER — AMLODIPINE BESYLATE 10 MG PO TABS
10.0000 mg | ORAL_TABLET | Freq: Every day | ORAL | Status: DC
  Administered 2014-08-05 – 2014-08-09 (×5): 10 mg via ORAL
  Filled 2014-08-04 (×5): qty 1

## 2014-08-04 MED ORDER — RITONAVIR 100 MG PO TABS: 100.0000 mg | ORAL_TABLET | Freq: Every day | ORAL | Status: DC

## 2014-08-04 MED ORDER — POTASSIUM CHLORIDE 10 MEQ/100ML IV SOLN
10.0000 meq | INTRAVENOUS | Status: DC
  Administered 2014-08-04: 10 meq via INTRAVENOUS
  Filled 2014-08-04: qty 100

## 2014-08-04 MED ORDER — PANTOPRAZOLE SODIUM 20 MG PO TBEC
20.0000 mg | DELAYED_RELEASE_TABLET | Freq: Two times a day (BID) | ORAL | Status: DC
Start: 2014-08-04 — End: 2014-08-11
  Administered 2014-08-05 – 2014-08-11 (×11): 20 mg via ORAL
  Filled 2014-08-04 (×17): qty 1

## 2014-08-04 MED ORDER — FLUTICASONE PROPIONATE 50 MCG/ACT NA SUSP
2.0000 | Freq: Every day | NASAL | Status: DC
  Administered 2014-08-04 – 2014-08-05 (×2): 2 via NASAL
  Filled 2014-08-04: qty 16

## 2014-08-04 MED ORDER — DIAZEPAM 5 MG PO TABS
5.0000 mg | ORAL_TABLET | Freq: Every evening | ORAL | Status: DC | PRN
  Administered 2014-08-04: 5 mg via ORAL
  Filled 2014-08-04 (×2): qty 1

## 2014-08-04 MED ORDER — LORAZEPAM 2 MG/ML IJ SOLN: 1.0000 mg | Freq: Three times a day (TID) | INTRAMUSCULAR | Status: DC | PRN

## 2014-08-04 MED ORDER — ALBUTEROL SULFATE (2.5 MG/3ML) 0.083% IN NEBU: 2.5000 mg | INHALATION_SOLUTION | Freq: Four times a day (QID) | RESPIRATORY_TRACT | Status: DC | PRN

## 2014-08-04 NOTE — ED Notes (Signed)
Called [facility].  They stated pt did have a bed as a direct admit from [facility], but pt was brought to ED by CT and lost her bed.  Internal Medicine paged at [phone].

## 2014-08-04 NOTE — Assessment & Plan Note (Addendum)
Pt reports having worsening nausea without vomiting since last Friday.  She denies any vomiting, diarrhea, or weight loss.  She reports having her last BM this AM which was normal, non-bloody.  She also complains of abdominal cramping that has also been worsening over the same time period.  She has decreased po intake.  Endorses chills but no fevers.  She has a history of diverticulitis and pancreatitis.  Denies any sick contacts.  Denies any urinary symptoms.  States that the pain comes and goes and "takes her breath away."  On exam, she has decreased BS, diffuse tenderness concentrated in the RU>RL quadrant without guarding or rebound.  She is mildly distended but abdomen is soft.  She is slightly tachycardic at 105, but otherwise VSS.   -will check stat lipase, cbc/diff, CMP, UA, CT abdomen/pelvis  -will admit for observation for pain control and IVF

## 2014-08-04 NOTE — Progress Notes (Signed)
[name], female   DOB: [birth year], 54 y.o.   MRN: [id number]     Subjective:   [name] female    DOB: 10-29-1960 54 y.o.    MRN: [id number] Health Maintenance Due: Health Maintenance Due  Topic Date Due  . PAP SMEAR  08/03/2014    _________________________________________________  HPI: [name] [name] is a 54 y.o. female here for an acute visit.  Pt has a PMH outlined below.  Please see problem-based charting assessment and plan note for further details of medical issues addressed at today's visit.  PMH: Past Medical History  Diagnosis Date  . HIV infection     1994  . Hepatitis C     1b; 02/2012 viral quant [id number]  . Hypertension   . GERD (gastroesophageal reflux disease)   . Allergy   . Depression   . CHF (congestive heart failure)   . Diverticulitis   . Arthritis   . Chronic back pain   . Hyperlipidemia   . Muscle cramps at night   . History of shingles     Medications: Current Outpatient Prescriptions on File Prior to Visit  Medication Sig Dispense Refill  . amLODipine (NORVASC) 10 MG tablet TAKE 1 TABLET BY MOUTH EVERY DAY 90 tablet 2  . cetirizine (ZYRTEC) 10 MG tablet TAKE 1 TABLET BY MOUTH ONCE DAILY AS NEEDED FOR ALLERGIES (Patient not taking: Reported on 06/28/2014) 90 tablet 1  . Cholecalciferol (VITAMIN D3) 50000 UNITS TABS Take 1 tablet by mouth once a week. 9 tablet 0  . diazepam (VALIUM) 5 MG tablet Take 1 tablet (5 mg total) by mouth at bedtime as needed. 30 tablet 2  . DIPHENHIST 25 MG tablet TAKE 1-2 TABLETS BY MOUTH EVERY NIGHT AT BEDTIME AS NEEDED FOR ITCHING (Patient not taking: Reported on 05/26/2014) 20 tablet 0  . emtricitabine-tenofovir (TRUVADA) 200-300 MG per tablet Take 1 tablet by mouth daily. 30 tablet 3  . fluticasone (FLONASE) 50 MCG/ACT nasal spray INSTILL 2 SPRAYS IN EACH NOSTRIL DAILY AS NEEDED 16 g 2  . gabapentin (NEURONTIN) 600 MG tablet Take 1 tablet (600 mg total) by mouth 3 (three) times daily.  (Patient not taking: Reported on 06/23/2014) 3 tablet 1  . hydrocortisone (CORTEF) 5 MG tablet Take 1 tablet (5 mg total) by mouth 2 (two) times daily. 45 tablet 0  . hydrOXYzine (ATARAX/VISTARIL) 25 MG tablet TAKE 1 TABLET BY MOUTH EVERY 8 HOURS AS NEEDED FOR ITCHING (Patient not taking: Reported on 05/26/2014) 60 tablet 2  . methocarbamol (ROBAXIN) 500 MG tablet TAKE 1 TABLET BY MOUTH EVERY 8 HOURS AS NEEDED FOR MUSCLE SPASMS 90 tablet 3  . mometasone (ELOCON) 0.1 % ointment Apply topically daily.    . montelukast (SINGULAIR) 10 MG tablet TAKE 1 TABLET BY MOUTH EVERY DAY 90 tablet 1  . naproxen (NAPROSYN) 500 MG tablet Take 1 tablet (500 mg total) by mouth 2 (two) times daily as needed for moderate pain (Take with food). 60 tablet 11  . NORVIR 100 MG TABS tablet TAKE 1 TABLET BY MOUTH EVERY DAY WITH BREAKFAST 30 tablet 2  . Olopatadine HCl (PATADAY) 0.2 % SOLN Apply 1 drop to eye daily as needed. For allergies     . pantoprazole (PROTONIX) 20 MG tablet Take 1 tablet (20 mg total) by mouth 2 (two) times daily. 60 tablet 5  . Potassium Chloride 40 MEQ/15ML (20%) SOLN TAKE 15 ML BY MOUTH EVERY MORNING 473 mL 1  . PREZISTA  800 MG tablet TAKE 1 TABLET BY MOUTH EVERY DAY WITH BREAKFAST 30 tablet 2  . PROAIR HFA 108 (90 BASE) MCG/ACT inhaler INHALE 1-2 PUFFS BY MOUTH INTO THE LUNGS EVERY 6 HOURS AS NEEDED 8.5 g 2  . promethazine (PHENERGAN) 50 MG tablet TAKE 1/2 TABLET BY MOUTH EVERY 6 HOURS AS NEEDED FOR NAUSEA OR VOMITING 30 tablet 2  . traMADol (ULTRAM) 50 MG tablet TAKE 2 TABLETS BY MOUTH EVERY 4-6 HOURS AS NEEDED FOR PAIN ; MUST LAST 30 DAYS 90 tablet 1  . [DISCONTINUED] hydrochlorothiazide (HYDRODIURIL) 25 MG tablet Take 25 mg by mouth daily.     No current facility-administered medications on file prior to visit.    Allergies: Allergies  Allergen Reactions  . Acetaminophen Other (See Comments)    Inflamed liver, hospitalized REACTION: Had liver problems, was hospitalized  . Dyazide  [Hydrochlorothiazide W-Triamterene] Hives  . Lisinopril     Cough   . Triamterene Hives  . Morphine Rash and Hives    REACTION: rash    FH: Family History  Problem Relation Age of Onset  . Heart disease Mother   . Diabetes Mother   . Stroke Mother   . Heart disease Father   . Stroke Father   . Diabetes Father   . Hepatitis Sister     hcv  . Stroke Other   . Colon polyps Brother   . Cancer Sister     lung    SH: History   Social History  . Marital Status: Single    Spouse Name: N/A  . Number of Children: 0  . Years of Education: N/A   Social History Main Topics  . Smoking status: Former Smoker    Types: Cigarettes    Quit date: 04/26/2007  . Smokeless tobacco: Never Used     Comment: QUIT 2009  . Alcohol Use: No  . Drug Use: No  . Sexual Activity:    Partners: Male     Comment: pt. given condoms   Other Topics Concern  . None   Social History Narrative   Alternate # [phone]    Review of Systems: Constitutional: Negative for fever, +chills and weight loss.  Eyes: Negative for blurred vision.  Respiratory: Negative for cough and +shortness of breath.  Cardiovascular: Negative for chest pain, palpitations and leg swelling.  Gastrointestinal: +nausea, -vomiting, +abdominal pain, -diarrhea, -constipation and -blood in stool.  Genitourinary: Negative for dysuria, urgency and frequency.  Musculoskeletal: +myalgias and back pain.  Neurological: Negative for dizziness, weakness and headaches.     Objective:   Vital Signs: Filed Vitals:   08/04/14 1415  BP: 148/82  Pulse: 105  Temp: 98.2 F (36.8 C)  TempSrc: Oral  Height: 5\' 1"  (1.549 m)  Weight: 160 lb 11.2 oz (72.893 kg)  SpO2: 100%     BP Readings from Last 3 Encounters:  08/04/14 148/82  07/28/14 153/87  07/07/14 155/87    Physical Exam: Constitutional: Vital signs reviewed.  Patient is doubled over in pain at times and appears in moderate distress.   Head: Normocephalic and  atraumatic. Eyes: EOMI, conjunctivae nl Cardiovascular: tachycardic, regular rhythm, no MRG. Pulmonary/Chest: normal effort, CTAB, no wheezes, rales, or rhonchi. Abdominal: Soft, diminished BS, diffuse tenderness in RUQ>RLQ, without rebound or guarding.  Neg murphy's sign.  No CVA tenderness.   Neurological: A&O x3, cranial nerves II-XII are grossly intact, moving all extremities. Extremities: 2+DP b/l; no pitting edema. Skin: Warm, dry and intact.    Assessment & Plan:  Assessment and plan was discussed and formulated with my attending.  Will admit for observation for abdominal pain, nausea, and decreased intake.

## 2014-08-04 NOTE — Assessment & Plan Note (Addendum)
Unclear etiology. -checking CMP, cbc/diff  -may also try a teaspoon of mustard

## 2014-08-04 NOTE — Telephone Encounter (Signed)
Pt called since over weekend having upper abd cramps ( under breast area ), nausea and leg cramps. Has phenergan at home that helps.  Denies vomiting or diarrhea. At time of call no open appt for the week. Suggest for pt to go to ER or Urgent Care. Name was placed on waiting list - 15 minutes from hospital. Judi Jaffe RN 08/04/14 10:40AM

## 2014-08-04 NOTE — Assessment & Plan Note (Signed)
BP today 148/82.  HR 105.   -cont current meds

## 2014-08-04 NOTE — H&P (Signed)
Date: 08/04/2014               Patient Name:  [name] [name] MRN: [id number]  DOB: 1960-08-10 Age / Sex: 54 y.o., female   PCP: Cresenciano Genre, MD         Medical Service: Internal Medicine Teaching Service         Attending Physician: Dr. Bertha Stakes, MD    First Contact: Dr. Albin Felling Pager: [phone]  Second Contact: Dr. Juluis Mire Pager: [phone]       After Hours (After 5p/  First Contact Pager: [phone]  weekends / holidays): Second Contact Pager: [phone]   Chief Complaint: lower extremity and abdominal cramping for the past 3 days  History of Present Illness: [name] [name] is a 54 yo woman with a history of adrenal insufficiency, HIV, hepatitis C and hypertension who presented from clinic with lower extremity and epigastric cramping. The lower extremity cramping has been ongoing for about two months. However, over the past 3 days, she also had epigastric cramping that worsened in intensity and became more frequent. The pain is worst at night and when she is at rest. She has tried robaxin, tramadol, naproxen, ice and valium; nothing really helps the pain, but the valium allows her to fall asleep. Her symptoms are associated with some nausea but no vomiting. She has had no changes in her bowel movements. Her appetite is decreased, but she does admit to eating cookies and peanut butter crackers today.  Of note, she was found to have adrenal insufficiency on a recent clinic visit with a low am cortisol (1). A determination of whether her adrenal insufficiency is primary or secondary is ongoing, and the patient has an endocrine appointment in early May. Her dexamethasone was recently switched to hydrocortisone due to lower extremity edema; she last took hydrocortisone this morning.  Meds: Prescriptions prior to admission  Medication Sig Dispense Refill Last Dose  . amLODipine (NORVASC) 10 MG tablet TAKE 1 TABLET BY MOUTH EVERY DAY 90 tablet 2 Taking  . cetirizine (ZYRTEC) 10 MG  tablet TAKE 1 TABLET BY MOUTH ONCE DAILY AS NEEDED FOR ALLERGIES (Patient not taking: Reported on 06/28/2014) 90 tablet 1 Not Taking  . Cholecalciferol (VITAMIN D3) 50000 UNITS TABS Take 1 tablet by mouth once a week. 9 tablet 0   . diazepam (VALIUM) 5 MG tablet Take 1 tablet (5 mg total) by mouth at bedtime as needed. 30 tablet 2 Taking  . DIPHENHIST 25 MG tablet TAKE 1-2 TABLETS BY MOUTH EVERY NIGHT AT BEDTIME AS NEEDED FOR ITCHING (Patient not taking: Reported on 05/26/2014) 20 tablet 0 Not Taking  . emtricitabine-tenofovir (TRUVADA) 200-300 MG per tablet Take 1 tablet by mouth daily. 30 tablet 3 Taking  . fluticasone (FLONASE) 50 MCG/ACT nasal spray INSTILL 2 SPRAYS IN EACH NOSTRIL DAILY AS NEEDED 16 g 2 Taking  . gabapentin (NEURONTIN) 600 MG tablet Take 1 tablet (600 mg total) by mouth 3 (three) times daily. (Patient not taking: Reported on 06/23/2014) 3 tablet 1 Not Taking  . hydrocortisone (CORTEF) 5 MG tablet Take 1 tablet (5 mg total) by mouth 2 (two) times daily. 45 tablet 0   . hydrOXYzine (ATARAX/VISTARIL) 25 MG tablet TAKE 1 TABLET BY MOUTH EVERY 8 HOURS AS NEEDED FOR ITCHING (Patient not taking: Reported on 05/26/2014) 60 tablet 2 Not Taking  . methocarbamol (ROBAXIN) 500 MG tablet TAKE 1 TABLET BY MOUTH EVERY 8 HOURS AS NEEDED FOR MUSCLE SPASMS 90 tablet 3 Taking  .  mometasone (ELOCON) 0.1 % ointment Apply topically daily.   Not Taking  . montelukast (SINGULAIR) 10 MG tablet TAKE 1 TABLET BY MOUTH EVERY DAY 90 tablet 1 Taking  . naproxen (NAPROSYN) 500 MG tablet Take 1 tablet (500 mg total) by mouth 2 (two) times daily as needed for moderate pain (Take with food). 60 tablet 11   . NORVIR 100 MG TABS tablet TAKE 1 TABLET BY MOUTH EVERY DAY WITH BREAKFAST 30 tablet 2   . Olopatadine HCl (PATADAY) 0.2 % SOLN Apply 1 drop to eye daily as needed. For allergies    Taking  . pantoprazole (PROTONIX) 20 MG tablet Take 1 tablet (20 mg total) by mouth 2 (two) times daily. 60 tablet 5 Taking  .  Potassium Chloride 40 MEQ/15ML (20%) SOLN TAKE 15 ML BY MOUTH EVERY MORNING 473 mL 1 Taking  . PREZISTA 800 MG tablet TAKE 1 TABLET BY MOUTH EVERY DAY WITH BREAKFAST 30 tablet 2   . PROAIR HFA 108 (90 BASE) MCG/ACT inhaler INHALE 1-2 PUFFS BY MOUTH INTO THE LUNGS EVERY 6 HOURS AS NEEDED 8.5 g 2 Taking  . promethazine (PHENERGAN) 50 MG tablet TAKE 1/2 TABLET BY MOUTH EVERY 6 HOURS AS NEEDED FOR NAUSEA OR VOMITING 30 tablet 2 Taking  . traMADol (ULTRAM) 50 MG tablet TAKE 2 TABLETS BY MOUTH EVERY 4-6 HOURS AS NEEDED FOR PAIN ; MUST LAST 30 DAYS 90 tablet 1 Taking     Allergies: Allergies as of 08/04/2014 - Review Complete 08/04/2014  Allergen Reaction Noted  . Acetaminophen Other (See Comments)   . Dyazide [hydrochlorothiazide w-triamterene] Hives 12/15/2010  . Lisinopril  05/18/2011  . Triamterene Hives 05/16/2012  . Morphine Rash and Hives 09/05/2007   Past Medical History  Diagnosis Date  . HIV infection     1994  . Hepatitis C     1b; 02/2012 viral quant [id number]  . Hypertension   . GERD (gastroesophageal reflux disease)   . Allergy   . Depression   . CHF (congestive heart failure)   . Diverticulitis   . Arthritis   . Chronic back pain   . Hyperlipidemia   . Muscle cramps at night   . History of shingles    Past Surgical History  Procedure Laterality Date  . Colectomy      2003 for diverticulitis  . Hand surgery    . Bunionectomy      b/l  . Shoulder surgery      left  . Tonsillectomy    . Nasal sinus surgery     Family History  Problem Relation Age of Onset  . Heart disease Mother   . Diabetes Mother   . Stroke Mother   . Heart disease Father   . Stroke Father   . Diabetes Father   . Hepatitis Sister     hcv  . Stroke Other   . Colon polyps Brother   . Cancer Sister     lung   History   Social History  . Marital Status: Single    Spouse Name: N/A  . Number of Children: 0  . Years of Education: N/A   Occupational History  . Not on file.    Social History Main Topics  . Smoking status: Former Smoker    Types: Cigarettes    Quit date: 04/26/2007  . Smokeless tobacco: Never Used     Comment: QUIT 2009  . Alcohol Use: No  . Drug Use: No  . Sexual Activity:  Partners: Male     Comment: pt. given condoms   Other Topics Concern  . Not on file   Social History Narrative   Alternate # [phone]    Review of Systems: See HPI  Physical Exam: Pulse 109, temperature 98 F (36.7 C), temperature source Oral, resp. rate 17, height 5\' 1"  (1.549 m), weight 160 lb (72.576 kg), SpO2 99 %. Appearance: in NAD, watching TV in dark room HEENT: AT/, PERRL, EOMi Heart: tachycardic, regular rhythm, no murmurs Lungs: CTAB, no wheezes, normal work of breathing Abdomen: bowel sounds present, soft, tender to palpation diffusely, no Murphy's sign, no rebound tenderness, two well-healed scars - one periumbilical, one LLQ Musculoskeletal: normal range of motion Extremities: trace BLE Neurologic: A&Ox3, grossly intact Skin: small hematomas on lower extremities   Lab results: Basic Metabolic Panel:  Recent Labs  08/04/14 1450  NA 136  K 3.2*  CL 98  CO2 27  GLUCOSE 110*  BUN 12  CREATININE 0.80  CALCIUM 8.8   Liver Function Tests:  Recent Labs  08/04/14 1450  AST 19  ALT 35  ALKPHOS 96  BILITOT 0.6  PROT 7.7  ALBUMIN 3.5    Recent Labs  08/04/14 1450  LIPASE 74*   CBC:  Recent Labs  08/04/14 1450  WBC 10.1  NEUTROABS 7.2  HGB 14.1  HCT 43.1  MCV 80.1  PLT 223   Urine Drug Screen: Drugs of Abuse     Component Value Date/Time   LABOPIA NONE DETECTED 03/26/2008 1619   COCAINSCRNUR NONE DETECTED 03/26/2008 1619   LABBENZ NONE DETECTED 03/26/2008 1619   AMPHETMU NONE DETECTED 03/26/2008 1619   THCU POSITIVE* 03/26/2008 1619   LABBARB  03/26/2008 1619    NONE DETECTED        DRUG SCREEN FOR MEDICAL PURPOSES ONLY.  IF CONFIRMATION IS NEEDED FOR ANY PURPOSE, NOTIFY LAB WITHIN 5 DAYS.     Urinalysis:  Recent Labs  08/04/14 1510 08/04/14 2054  COLORURINE  --  YELLOW  LABSPEC  --  <1.005*  PHURINE  --  6.5  GLUCOSEU  --  NEGATIVE  HGBUR  --  NEGATIVE  BILIRUBINUR Small NEGATIVE  KETONESUR  --  NEGATIVE  PROTEINUR 100 NEGATIVE  UROBILINOGEN 1.0 0.2  NITRITE Negative NEGATIVE  LEUKOCYTESUR Negative NEGATIVE    Imaging results:  Ct Abdomen Pelvis W Contrast  08/04/2014   CLINICAL DATA:  Right upper quadrant and right lower quadrant cramping abdominal pain. Symptoms over the last few days, intensifying. Nausea. History of diverticulitis in pancreatitis.  EXAM: CT ABDOMEN AND PELVIS WITH CONTRAST  TECHNIQUE: Multidetector CT imaging of the abdomen and pelvis was performed using the standard protocol following bolus administration of intravenous contrast.  CONTRAST:  146mL OMNIPAQUE IOHEXOL 300 MG/ML  SOLN  COMPARISON:  Remote CT 04/28/2005  FINDINGS: The included lung bases are clear.  There is a 9 mm cyst in the left lobe of the liver. No enhancing hepatic lesion. Gallbladder is physiologically distended, no calcified gallstones. No biliary dilatation. The spleen, adrenal glands, and pancreas are normal. No pancreatic ductal dilatation or surrounding inflammatory change. The previous small hypodense lesion in the spleen is no longer present. There is an 8 mm hypodensity in the anterior right kidney, unchanged from prior exam, too small to characterize. Tiny cortical hypodensity in the mid left kidney, unchanged. There is symmetric renal enhancement and excretion.  The stomach is physiologically distended. There are no dilated or thickened bowel loops. The appendix is normal. Small volume of  stool throughout the colon, no colonic wall thickening. There is a prominent sigmoid colonic diverticulum without associated inflammatory change to suggest diverticulitis, this is unchanged from prior exam. No free air, free fluid, or intra-abdominal fluid collection.  Abdominal aorta is normal in  caliber with mild moderate atherosclerosis. No retroperitoneal adenopathy.  Within the pelvis the urinary bladder is physiologically distended. The uterus is normal in size. There is no adnexal mass. No pelvic free fluid. No pelvic adenopathy.  There are no acute or suspicious osseous abnormalities.  IMPRESSION: 1. No acute abnormality in the abdomen/pelvis. 2. Colonic diverticula without diverticulitis. 3. Unchanged small subcentimeter hypodensities in both kidneys, too small to characterize but likely cysts. Small hepatic cyst.   Electronically Signed   By: Jeb Levering M.D.   On: 08/04/2014 18:54   Assessment & Plan by Problem: Principal Problem:   Abdominal pain Active Problems:   HIV disease   Allergic rhinitis   GERD   Chronic low back pain   HTN (hypertension)   Fatigue   Muscle cramp, nocturnal   Right sided abdominal pain associated with nausea    Type I renal tubular acidosis   Lumbosacral spondylosis without myelopathy   Vitamin D deficiency   Adrenal insufficiency   Nausea  Abdominal Cramping: Also cramping in legs. Patient is currently being worked up for her adrenal insufficiency (based on a low AM cortisol level, 1); still determining whether it is primary or secondary. Her dexamethasone was recently switched to hydrocortisone; she last took hydrocortisone this morning at home dose 5 mg BID PO. She also has a history of GERD. Her abdominal pain could be related to adrenal insufficiency (can cause nausea, vomiting, dyspepsia, gastroenteritis), pancreatitis (visualized on CT 2002, but not on CT today) with a slightly elevated lipase, or isolated gastroenteritis. UA negative, troponin negative x1.  - Has appointment with Dr. Buddy Duty, outpatient endocrinology, on 5/13; consider calling him to discuss tomorrow - Amylase pending - CK total and CKMB tomorrow am - ACTH, plasma renin/aldosterone, cortisol, ACTH stimulation test tomorrow am given that she will have been off of her  hydrocortisone for 24 hours at that point - Pain control with home tramadol 50 mg q6 PRN, naproxen 500 mg BID PRN - Valium 5 mg at bedtime PRN for anxiety and cramping - Phenergan PRN - Protonix 40 mg daily - Solucortef dose 5 mg BID PO; can escalate to 25-50 mg IV if symptoms worsen (to then be tapered to home dose over 3 days) - UDS pending  - IVF NS 75 mL/hr  Hypokalemia: 3.2 on admission. Initially was written for IV and received part of a bag; requested switch to oral. - Potassium chloride 40 meq oral daily  Tachycardia: Likely secondary to pain.  - Continue to follow  HIV: Had been well-controlled on HAART, then she was taken off of HAART due to an allergic reaction (rash) in 2015 while she was also getting hepatitis C therapy. Resumed HARRT therapy in 04/2014 (Prezista, Norvir, Truvada), now with CD4 400, VL <110 on 05/2014 (followed by Dr. Johnnye Sima). Has intermittent nausea/vomiting on her HAART regimen. - Continue phenergan PRN for nausea/vomiting - Continue home Prezista, Norvir and Truvada  Hepatitis C 1b: Had a biopsy 03/2012, with no evidence of cirrhosis. Treated at [facility] with Simeprevir and Sofosbuvir 01/2013, then undetectable 06/2013.  Hypertension: On norvasc 10 mg daily at home. Currently 142/82. - Continue norvasc  Chronic Low Back Pain: Has lumbar spondylosis without myelopathy s/p lumbar branch blocks and s/p  bilateral radiofrequency ablation for arthritis and bulging disk.  - Pain regimen as above  Allergic Rhinitis: On home albuterol and flonase. - Continue home medications - Singulair 10 mg daily at bedtime  Diet: HH  DVT Ppx: Lovenox [place] and scds  Dispo: Disposition is deferred at this time, awaiting improvement of current medical problems. Anticipated discharge in approximately 1-2 day(s).   The patient does have a current PCP Cresenciano Genre, MD) and does need an [facility] hospital follow-up appointment after discharge.  The patient does not have  transportation limitations that hinder transportation to clinic appointments.  Signed: Karlene Einstein, MD 08/04/2014, 8:29 PM

## 2014-08-04 NOTE — Assessment & Plan Note (Signed)
UTD

## 2014-08-04 NOTE — Progress Notes (Signed)
54 year old HIV positive lady, on cortef for adrenal insufficiency, complaining of cramping RUQ + RLQ abdominal pain, which intensified over the weekend, accompanied with nausea, no vomiting. Normal BM this morning. Appetite ok but not able to eat well because of nausea.. Right now patient says its 5-6/10. Abdominal Exam - normal overlying skin, mildly tender to palpation RUQ area, no murphy's sign. Stat CMP, CBC, UA, Lipase, and CT Abd Pelvis. Reassess after results.   Update - No CT results so far. Patients reports worsening pain. Given immunocompromised host, mild lipase elevation, inability to eat, I think we can obs admit for work up and pain control. D/W Dr Gordy Levan.

## 2014-08-05 ENCOUNTER — Encounter (HOSPITAL_COMMUNITY): Payer: Self-pay

## 2014-08-05 DIAGNOSIS — R110 Nausea: Secondary | ICD-10-CM | POA: Diagnosis present

## 2014-08-05 DIAGNOSIS — E2740 Unspecified adrenocortical insufficiency: Secondary | ICD-10-CM

## 2014-08-05 DIAGNOSIS — E876 Hypokalemia: Secondary | ICD-10-CM | POA: Diagnosis present

## 2014-08-05 DIAGNOSIS — E2749 Other adrenocortical insufficiency: Secondary | ICD-10-CM | POA: Diagnosis present

## 2014-08-05 DIAGNOSIS — Z886 Allergy status to analgesic agent status: Secondary | ICD-10-CM | POA: Diagnosis not present

## 2014-08-05 DIAGNOSIS — D497 Neoplasm of unspecified behavior of endocrine glands and other parts of nervous system: Secondary | ICD-10-CM | POA: Diagnosis present

## 2014-08-05 DIAGNOSIS — E559 Vitamin D deficiency, unspecified: Secondary | ICD-10-CM | POA: Diagnosis present

## 2014-08-05 DIAGNOSIS — M62838 Other muscle spasm: Secondary | ICD-10-CM | POA: Diagnosis present

## 2014-08-05 DIAGNOSIS — R000 Tachycardia, unspecified: Secondary | ICD-10-CM

## 2014-08-05 DIAGNOSIS — B370 Candidal stomatitis: Secondary | ICD-10-CM | POA: Diagnosis present

## 2014-08-05 DIAGNOSIS — B1920 Unspecified viral hepatitis C without hepatic coma: Secondary | ICD-10-CM | POA: Diagnosis present

## 2014-08-05 DIAGNOSIS — Z21 Asymptomatic human immunodeficiency virus [HIV] infection status: Secondary | ICD-10-CM | POA: Diagnosis not present

## 2014-08-05 DIAGNOSIS — T380X5A Adverse effect of glucocorticoids and synthetic analogues, initial encounter: Secondary | ICD-10-CM

## 2014-08-05 DIAGNOSIS — J309 Allergic rhinitis, unspecified: Secondary | ICD-10-CM

## 2014-08-05 DIAGNOSIS — G8929 Other chronic pain: Secondary | ICD-10-CM | POA: Diagnosis present

## 2014-08-05 DIAGNOSIS — R109 Unspecified abdominal pain: Secondary | ICD-10-CM | POA: Diagnosis not present

## 2014-08-05 DIAGNOSIS — M47817 Spondylosis without myelopathy or radiculopathy, lumbosacral region: Secondary | ICD-10-CM | POA: Diagnosis present

## 2014-08-05 DIAGNOSIS — N2589 Other disorders resulting from impaired renal tubular function: Secondary | ICD-10-CM | POA: Diagnosis present

## 2014-08-05 DIAGNOSIS — K219 Gastro-esophageal reflux disease without esophagitis: Secondary | ICD-10-CM | POA: Diagnosis present

## 2014-08-05 DIAGNOSIS — I10 Essential (primary) hypertension: Secondary | ICD-10-CM

## 2014-08-05 DIAGNOSIS — Z885 Allergy status to narcotic agent status: Secondary | ICD-10-CM | POA: Diagnosis not present

## 2014-08-05 DIAGNOSIS — K5730 Diverticulosis of large intestine without perforation or abscess without bleeding: Secondary | ICD-10-CM | POA: Diagnosis present

## 2014-08-05 DIAGNOSIS — Z8249 Family history of ischemic heart disease and other diseases of the circulatory system: Secondary | ICD-10-CM | POA: Diagnosis not present

## 2014-08-05 DIAGNOSIS — N182 Chronic kidney disease, stage 2 (mild): Secondary | ICD-10-CM

## 2014-08-05 DIAGNOSIS — E273 Drug-induced adrenocortical insufficiency: Secondary | ICD-10-CM | POA: Diagnosis not present

## 2014-08-05 DIAGNOSIS — M47896 Other spondylosis, lumbar region: Secondary | ICD-10-CM

## 2014-08-05 DIAGNOSIS — E099 Drug or chemical induced diabetes mellitus without complications: Secondary | ICD-10-CM | POA: Diagnosis present

## 2014-08-05 DIAGNOSIS — Z809 Family history of malignant neoplasm, unspecified: Secondary | ICD-10-CM | POA: Diagnosis not present

## 2014-08-05 DIAGNOSIS — I509 Heart failure, unspecified: Secondary | ICD-10-CM | POA: Diagnosis present

## 2014-08-05 DIAGNOSIS — R739 Hyperglycemia, unspecified: Secondary | ICD-10-CM | POA: Diagnosis present

## 2014-08-05 DIAGNOSIS — Z79899 Other long term (current) drug therapy: Secondary | ICD-10-CM | POA: Diagnosis not present

## 2014-08-05 DIAGNOSIS — M545 Low back pain: Secondary | ICD-10-CM | POA: Diagnosis present

## 2014-08-05 DIAGNOSIS — R252 Cramp and spasm: Secondary | ICD-10-CM

## 2014-08-05 DIAGNOSIS — Z888 Allergy status to other drugs, medicaments and biological substances status: Secondary | ICD-10-CM | POA: Diagnosis not present

## 2014-08-05 DIAGNOSIS — B20 Human immunodeficiency virus [HIV] disease: Secondary | ICD-10-CM | POA: Diagnosis present

## 2014-08-05 DIAGNOSIS — B182 Chronic viral hepatitis C: Secondary | ICD-10-CM | POA: Diagnosis not present

## 2014-08-05 DIAGNOSIS — F419 Anxiety disorder, unspecified: Secondary | ICD-10-CM | POA: Diagnosis present

## 2014-08-05 DIAGNOSIS — Z87891 Personal history of nicotine dependence: Secondary | ICD-10-CM | POA: Diagnosis not present

## 2014-08-05 LAB — MAGNESIUM: Magnesium: 2 mg/dL (ref 1.5–2.5)

## 2014-08-05 LAB — BASIC METABOLIC PANEL
Anion gap: 5 (ref 5–15)
BUN: 10 mg/dL (ref 6–23)
CO2: 28 mmol/L (ref 19–32)
Calcium: 8.7 mg/dL (ref 8.4–10.5)
Chloride: 102 mmol/L (ref 96–112)
Creatinine, Ser: 0.7 mg/dL (ref 0.50–1.10)
GFR calc Af Amer: >90 mL/min (ref >90)
GFR calc non Af Amer: >90 mL/min (ref >90)
GLUCOSE: 172 mg/dL — AB (ref 70–99)
POTASSIUM: 3.9 mmol/L (ref 3.5–5.1)
SODIUM: 135 mmol/L (ref 135–145)

## 2014-08-05 LAB — CBC WITH DIFFERENTIAL/PLATELET
BASOS ABS: 0 10*3/uL (ref 0.0–0.1)
Basophils Relative: 0 % (ref 0–1)
Eosinophils Absolute: 0.1 10*3/uL (ref 0.0–0.7)
Eosinophils Relative: 1 % (ref 0–5)
HCT: 38.7 % (ref 36.0–46.0)
Hemoglobin: 12.5 g/dL (ref 12.0–15.0)
LYMPHS ABS: 2.1 10*3/uL (ref 0.7–4.0)
Lymphocytes Relative: 23 % (ref 12–46)
MCH: 25.7 pg — AB (ref 26.0–34.0)
MCHC: 32.3 g/dL (ref 30.0–36.0)
MCV: 79.5 fL (ref 78.0–100.0)
Monocytes Absolute: 0.7 10*3/uL (ref 0.1–1.0)
Monocytes Relative: 8 % (ref 3–12)
NEUTROS PCT: 68 % (ref 43–77)
Neutro Abs: 6.4 10*3/uL (ref 1.7–7.7)
PLATELETS: 216 10*3/uL (ref 150–400)
RBC: 4.87 MIL/uL (ref 3.87–5.11)
RDW: 17.1 % — AB (ref 11.5–15.5)
WBC: 9.2 10*3/uL (ref 4.0–10.5)

## 2014-08-05 LAB — ACTH

## 2014-08-05 LAB — URINALYSIS, MICROSCOPIC ONLY
Bacteria, UA: NONE SEEN
Casts: NONE SEEN
Crystals: NONE SEEN

## 2014-08-05 LAB — CORTISOL: Cortisol, Plasma: 2.2 ug/dL

## 2014-08-05 LAB — CK TOTAL AND CKMB (NOT AT ARMC)
CK, MB: 1.6 ng/mL (ref 0.3–4.0)
RELATIVE INDEX: INVALID (ref 0.0–2.5)
Total CK: 75 U/L (ref 7–177)

## 2014-08-05 LAB — URINALYSIS, ROUTINE W REFLEX MICROSCOPIC
Glucose, UA: NEGATIVE mg/dL
HGB URINE DIPSTICK: NEGATIVE
Leukocytes, UA: NEGATIVE
NITRITE: NEGATIVE
PROTEIN: 30 mg/dL — AB
Specific Gravity, Urine: 1.015 (ref 1.005–1.030)
Urobilinogen, UA: 1 mg/dL (ref 0.0–1.0)
pH: 6 (ref 5.0–8.0)

## 2014-08-05 LAB — AMYLASE: Amylase: 145 U/L — ABNORMAL HIGH (ref 0–105)

## 2014-08-05 LAB — PHOSPHORUS: PHOSPHORUS: 3.6 mg/dL (ref 2.3–4.6)

## 2014-08-05 MED ORDER — HYDROMORPHONE HCL 1 MG/ML IJ SOLN
0.5000 mg | Freq: Once | INTRAMUSCULAR | Status: AC
  Administered 2014-08-05: 0.5 mg via INTRAVENOUS
  Filled 2014-08-05: qty 1

## 2014-08-05 MED ORDER — DIAZEPAM 5 MG/ML IJ SOLN
INTRAMUSCULAR | Status: AC
  Filled 2014-08-05: qty 2

## 2014-08-05 MED ORDER — DIAZEPAM 5 MG PO TABS: 5.0000 mg | ORAL_TABLET | Freq: Once | ORAL | Status: DC

## 2014-08-05 MED ORDER — RITONAVIR 100 MG PO TABS
100.0000 mg | ORAL_TABLET | Freq: Every day | ORAL | Status: DC
  Administered 2014-08-05 – 2014-08-10 (×6): 100 mg via ORAL
  Filled 2014-08-05 (×6): qty 1

## 2014-08-05 MED ORDER — DIAZEPAM 5 MG/ML IJ SOLN
5.0000 mg | Freq: Three times a day (TID) | INTRAMUSCULAR | Status: DC | PRN
  Administered 2014-08-06 – 2014-08-08 (×4): 5 mg via INTRAVENOUS
  Filled 2014-08-05 (×4): qty 2

## 2014-08-05 MED ORDER — LORATADINE 10 MG PO TABS
10.0000 mg | ORAL_TABLET | Freq: Every day | ORAL | Status: DC
  Administered 2014-08-05 – 2014-08-11 (×7): 10 mg via ORAL
  Filled 2014-08-05 (×7): qty 1

## 2014-08-05 MED ORDER — SALINE SPRAY 0.65 % NA SOLN
1.0000 | NASAL | Status: DC | PRN
  Administered 2014-08-11: 1 via NASAL
  Filled 2014-08-05: qty 44

## 2014-08-05 MED ORDER — COSYNTROPIN 0.25 MG IJ SOLR
0.2500 mg | Freq: Once | INTRAMUSCULAR | Status: AC
  Administered 2014-08-06: 0.25 mg via INTRAVENOUS
  Filled 2014-08-05: qty 0.25

## 2014-08-05 MED ORDER — RITONAVIR 100 MG PO TABS
100.0000 mg | ORAL_TABLET | Freq: Every day | ORAL | Status: DC
  Filled 2014-08-05: qty 1

## 2014-08-05 MED ORDER — EMTRICITABINE-TENOFOVIR DF 200-300 MG PO TABS
1.0000 | ORAL_TABLET | Freq: Every day | ORAL | Status: DC
  Administered 2014-08-05 – 2014-08-10 (×6): 1 via ORAL
  Filled 2014-08-05 (×6): qty 1

## 2014-08-05 MED ORDER — DIAZEPAM 5 MG/ML IJ SOLN
5.0000 mg | Freq: Once | INTRAMUSCULAR | Status: AC
  Administered 2014-08-05: 5 mg via INTRAVENOUS

## 2014-08-05 NOTE — Progress Notes (Signed)
Pt. Reports that she takes her HIV medications at night due to the medications where causing her to be have increased nausea.  I have sent pharmacy a message and requested that HIV medications be given at night per pt. Request.

## 2014-08-05 NOTE — Progress Notes (Signed)
0245  Pt. Called nurse to the room.  She informs me that she is having a unbearable cramp in her right upper quadrant abdominal area.  Pt. Is hollering out, crying and balled up in a fetal position.  Dr. Aundra Dubin requested to be called when pt. Has cramps/spasms.  I have called and informed Dr. Aundra Dubin.  She has come to bedside to see pt.    I have given pt. IV Valium and heating packs placed on pt's abdomen.  I have also ordered K-pad to help with pain.  Pt. Was given Ultram 50 mg and Naproxen 500 mg for pain.  Pt. Reports no nausea at this time.    Pt. Reported that when her cramping/spasms begin she reported that she was having chest pain and could not breath. 02 at 3-4 per [place] applied.  Pt. Instructed on how to slow breathing down. MD at bedside and new orders given.    Will continue to follow progress of pt.

## 2014-08-05 NOTE — Progress Notes (Signed)
UR completed 

## 2014-08-05 NOTE — Progress Notes (Signed)
Subjective: Patient continues to report severe cramping in her abdomen. She states her abdomen is larger and more tense than normal. She states her leg cramping is not as severe as her abdominal cramping. She reports associated nausea but no vomiting.   Objective: Vital signs in last 24 hours: Filed Vitals:   08/04/14 1959 08/05/14 0508  BP:  152/76  Pulse: 109 102  Temp: 98 F (36.7 C) 97.7 F (36.5 C)  TempSrc: Oral Oral  Resp: 17 17  Height: _0  (1.549 m)   Weight: 160 lb (72.576 kg) 159 lb 9.7 oz (72.396 kg)  SpO2: 99% 100%   Weight change:  No intake or output data in the 24 hours ending 08/05/14 1115  Physical Exam General: sitting up in bed, appears anxious  HEENT: [place]/AT, EOMI, mucus membranes moist CV: tachycardic in 100s, no m/g/r Pulm: CTA bilaterally, breaths non-labored  Abd: BS+, diffusely tender to palpation, distended primarily in upper abdomen Ext: warm, no edema Neuro: alert and oriented x 3, no focal deficits   Lab Results: Basic Metabolic Panel:  Recent Labs Lab 08/04/14 1450 08/05/14 0117  NA 136 135  K 3.2* 3.9  CL 98 102  CO2 27 28  GLUCOSE 110* 172*  BUN 12 10  CREATININE 0.80 0.70  CALCIUM 8.8 8.7  MG  --  2.0  PHOS  --  3.6   Liver Function Tests:  Recent Labs Lab 08/04/14 1450  AST 19  ALT 35  ALKPHOS 96  BILITOT 0.6  PROT 7.7  ALBUMIN 3.5    Recent Labs Lab 08/04/14 1450 08/05/14 0117  LIPASE 74*  --   AMYLASE  --  145*   CBC:  Recent Labs Lab 08/04/14 1450 08/05/14 0117  WBC 10.1 9.2  NEUTROABS 7.2 6.4  HGB 14.1 12.5  HCT 43.1 38.7  MCV 80.1 79.5  PLT 223 216   Cardiac Enzymes:  Recent Labs Lab 08/04/14 2056 08/05/14 0117  CKTOTAL  --  75  CKMB  --  1.6  TROPONINI <0.03  --    Urinalysis:  Recent Labs Lab 08/04/14 1510 08/04/14 2054  COLORURINE YELLOW YELLOW  LABSPEC 1.015 <1.005*  PHURINE 6.0 6.5  GLUCOSEU NEG NEGATIVE  HGBUR NEG NEGATIVE  BILIRUBINUR Small  SMALL* NEGATIVE    KETONESUR TRACE* NEGATIVE  PROTEINUR 100  30* NEGATIVE  UROBILINOGEN 1  1.0 0.2  NITRITE Negative  NEG NEGATIVE  LEUKOCYTESUR NEG  Negative NEGATIVE   Studies/Results: Ct Abdomen Pelvis W Contrast  08/04/2014   CLINICAL DATA:  Right upper quadrant and right lower quadrant cramping abdominal pain. Symptoms over the last few days, intensifying. Nausea. History of diverticulitis in pancreatitis.  EXAM: CT ABDOMEN AND PELVIS WITH CONTRAST  TECHNIQUE: Multidetector CT imaging of the abdomen and pelvis was performed using the standard protocol following bolus administration of intravenous contrast.  CONTRAST:  172m OMNIPAQUE IOHEXOL 300 MG/ML  SOLN  COMPARISON:  Remote CT 04/28/2005  FINDINGS: The included lung bases are clear.  There is a 9 mm cyst in the left lobe of the liver. No enhancing hepatic lesion. Gallbladder is physiologically distended, no calcified gallstones. No biliary dilatation. The spleen, adrenal glands, and pancreas are normal. No pancreatic ductal dilatation or surrounding inflammatory change. The previous small hypodense lesion in the spleen is no longer present. There is an 8 mm hypodensity in the anterior right kidney, unchanged from prior exam, too small to characterize. Tiny cortical hypodensity in the mid left kidney, unchanged. There is symmetric renal  enhancement and excretion.  The stomach is physiologically distended. There are no dilated or thickened bowel loops. The appendix is normal. Small volume of stool throughout the colon, no colonic wall thickening. There is a prominent sigmoid colonic diverticulum without associated inflammatory change to suggest diverticulitis, this is unchanged from prior exam. No free air, free fluid, or intra-abdominal fluid collection.  Abdominal aorta is normal in caliber with mild moderate atherosclerosis. No retroperitoneal adenopathy.  Within the pelvis the urinary bladder is physiologically distended. The uterus is normal in size. There is  no adnexal mass. No pelvic free fluid. No pelvic adenopathy.  There are no acute or suspicious osseous abnormalities.  IMPRESSION: 1. No acute abnormality in the abdomen/pelvis. 2. Colonic diverticula without diverticulitis. 3. Unchanged small subcentimeter hypodensities in both kidneys, too small to characterize but likely cysts. Small hepatic cyst.   Electronically Signed   By: Jeb Levering M.D.   On: 08/04/2014 18:54   Medications: I have reviewed the patient's current medications.  Assessment/Plan: Principal Problem:   Abdominal pain Active Problems:   HIV disease   Allergic rhinitis   GERD   Chronic low back pain   HTN (hypertension)   Fatigue   Muscle cramp, nocturnal   Right sided abdominal pain associated with nausea    Type I renal tubular acidosis   Lumbosacral spondylosis without myelopathy   Vitamin D deficiency   Adrenal insufficiency   Nausea   Steroid-induced hyperglycemia   Muscle spasm  Abdominal Cramping: Patient continues to have severe abdominal cramping. She states she had 4 separate episodes yesterday. She reports the K pads and valium help the cramps. She does appear to be distended on exam and has diffuse tenderness. However, CT Abd yesterday (4/11) did not show any acute process. She has had extensive work up as an outpatient with her PCP, including checking vitamin D (low 9), CPK (normal), TSH (normal), ESR and CRP (normal), liver enzymes (normal), aldolase (normal), and AM cortisol (low at 1). However this level was measured at 9:30 AM which could affect results. She is getting further work up for AI, see below. AI could explain her muscle cramps. Other etiologies to consider are rheumatoid or autoimmune diseases, including RA, SLE, polymyalgia rheumatica, vasculitis. However, ANA and ANCA tests on 06/23/14 were negative. Also ESR and CRP noted to be negative as above.  - Continue K pads - Continue Valium 5 mg IV Q8H PRN - Work up for AI as below   Adrenal  Insufficiency: Patient has an AM cortisol of 1 on 06/27/14 which is suggestive of adrenal insufficiency. I spoke with Dr. Buddy Duty from endocrinology and we will need to perform cosyntropin stimulation test to confirm diagnosis of adrenal insufficiency and determine if it is primary or secondary.  - Endocrinology consulted, appreciate recommendations and help with this case - NPO at 5 PM - Hold afternoon hydrocortisone dose - Tomorrow at 5 AM check ACTH, cortisol, and renin-aldosterone levels. Give cosyntropin 0.25 mg IV. Check cortisol 60 mins after cosyntropin administration.  - Will give next dose of hydrocortisone after labs are drawn for work up. Will follow up recommended dose by endocrine.   Hypokalemia: K 3.9 this AM.  - Continue to monitor   Tachycardia: Likely secondary to pain from muscle spasms. HR has remained in 100s.  - Continue to monitor   HIV: Had been well-controlled on HAART, then she was taken off of HAART due to an allergic reaction (rash) in 2015 while she was also getting hepatitis  C therapy. Resumed HARRT therapy in 04/2014 (Prezista, Norvir, Truvada), now with CD4 400, VL <110 on 05/2014. She is followed by Dr. Johnnye Sima. 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 Treated at [facility] with Simeprevir and Sofosbuvir 01/2013, then undetectable 06/2013.  HTN: BP currently 152/76. Likely related to pain due to muscle cramps.  - Continue home Norvasc 10 mg daily   Chronic Low Back Pain: Has lumbar spondylosis without myelopathy s/p lumbar branch blocks and s/p bilateral radiofrequency ablation for arthritis and bulging disk.  - Continue Tramadol 50 mg Q6H PRN - Continue Naproxen 500 mg BID PRN  Allergic Rhinitis: On albuterol and flonase at home.  - Continue home medications - Continue Singulair 10 mg daily at bedtime    Diet: Heart healthy, NPO at 5 PM VTE  PPx: Lovenox SQ Dispo: Disposition is deferred at this time, awaiting improvement of current medical problems.  Anticipated discharge in approximately 2-3 day(s).   The patient does have a current PCP Cresenciano Genre, MD) and does need an [facility] hospital follow-up appointment after discharge.  The patient does not have transportation limitations that hinder transportation to clinic appointments.  .Services Needed at time of discharge: Y = Yes, Blank = No PT:   OT:   RN:   Equipment:   Other:       Juliet Rude, MD 08/05/2014, 11:15 AM

## 2014-08-05 NOTE — Consult Note (Signed)
Reason for Consult: evaluate adrenal insufficiency  Referring Physician: Dr. Albin Felling, Internal Medicine Teaching Service  [name] [name] is an 54 y.o. female.   History of present illness: [name] [name] has suffered from leg cramps for the past 7 or 8 months.  Cramps progress from her feet to her calves. The cramps are worse at night, but also occur during the day. She also has cramps in her abdomen and chest wall. Many of her episodes last 5-10 minutes. The problems were particularly bothersome on Saturday and Sunday before her admission to the hospital. Her cramps initially began during a medication holiday from her HIV medications.  She resumed her HIV medicines as instructed, about 5 months ago--after the onset of the cramps.  Valium was prescribed for the cramps.   Her primary care physician found that her cortisol was "really low".  After the low cortisol level, dexamethasone was started.  Dexamethasone therapy led to edema, weight gain to 164 pounds (from baseline of 142-152 pounds), and bruising. Her dexamethasone was discontinued and hydrocortisone started which improved the edema.  Neither dexamethasone nor hydrocortisone improved the cramps.   [name] [name] recalls only two short courses of corticosteroids for pneumonia "a long time ago". No recent corticosteroid therapy until starting dexamethasone then hydrocortisone.  Past Medical History  Diagnosis Date  . HIV infection     1994  . Hepatitis C     1b; 02/2012 viral quant [id number]  . Hypertension   . GERD (gastroesophageal reflux disease)   . Allergy   . Depression   . CHF (congestive heart failure)   . Diverticulitis   . Arthritis   . Chronic back pain   . Hyperlipidemia   . Muscle cramps at night   . History of shingles   . Pancreatitis     mild to mod pancreatitis ct 2002   . Bulging lumbar disc   . Refusal of blood transfusions as patient is Jehovah\'s Witness     Past Surgical History  Procedure Laterality Date  .  Colectomy      20 03 for diverticulitis  . Hand surgery    . Bunionectomy      b/l  . Shoulder surgery      left  . Tonsillectomy    . Nasal sinus surgery      Family History  Problem Relation Age of Onset  . Heart disease Mother   . Diabetes Mother   . Stroke Mother   . Heart disease Father   . Stroke Father   . Diabetes Father   . Hepatitis Sister     hcv  . Stroke Other   . Colon polyps Brother   . Cancer Sister     lung    Social History:  reports that she quit smoking about 7 years ago. Her smoking use included Cigarettes. She has never used smokeless tobacco. She reports that she does not drink alcohol or use illicit drugs.  Allergies:  Allergies  Allergen Reactions  . Acetaminophen Other (See Comments)    Inflamed liver, hospitalized REACTION: Had liver problems, was hospitalized  . Dyazide [Hydrochlorothiazide W-Triamterene] Hives  . Lisinopril     Cough   . Triamterene Hives  . Morphine Rash and Hives    REACTION: rash    Medications: I have reviewed the patient's current medications.  ROS  General: No weight loss. Endocrine:  No cold intolerance, no heat intolerance. Gastrointestinal:  Nausea persists (with or without dexamethasone or hydrocortisone), no vomiting, no  diarrhea. Skin: No rash, no hyperpigmentation. Cardiovascular:  Hypertension, "never" experienced hypotension. Hematologic/immunologic:  Chills, but uncertain if any fever.  Blood pressure 150/72, pulse 111, temperature 97.8 F (36.6 C), temperature source Oral, resp. rate 17, height 5\' 1"  (1.549 m), weight 72.396 kg (159 lb 9.7 oz), SpO2 99 %. Physical Exam General:  apparent mild distress from leg cramps and abdominal discomfort. Eyes: Extraocular eye movements intact, anicteric. Neck: Supple, trachea midline. Thyroid: No appreciable enlargement, but partially substernal which limits the exam. Cardiovascular: Regular rhythm and rate, normal radial pulses. Respiratory: Normal  respiratory effort, clear to auscultation. Gastrointestinal: Normal pitch active bowel sounds, tender abdomen (mild diffuse tenderness but moderate tenderness in right upper quadrant over liver edge). Neurologic: Cranial nerves normal as tested, patellar deep tendon reflexes 2+ and brisk. Musculoskeletal: Normal muscle tone, no appreciable muscle atrophy. Skin: Appropriate warmth, no pathologic hyperpigmentation. Mental status: Alert, conversant, speech clear, thought logical, mildly resected affect congruent with mood, no hallucinations or delusions evident. Hematologic/lymphatic: No cervical adenopathy, no visible ecchymoses or petechia.  Lab Results  Component Value Date   LABCORT 1.0* 06/27/2014   TSH 0.695 06/26/2014   WBC 9.2 08/05/2014   HGB 12.5 08/05/2014   EOSABS 0.1 08/05/2014    Ct Abdomen Pelvis W Contrast  08/04/2014   CLINICAL DATA:  Right upper quadrant and right lower quadrant cramping abdominal pain. Symptoms over the last few days, intensifying. Nausea. History of diverticulitis in pancreatitis.  EXAM: CT ABDOMEN AND PELVIS WITH CONTRAST  TECHNIQUE: Multidetector CT imaging of the abdomen and pelvis was performed using the standard protocol following bolus administration of intravenous contrast.  CONTRAST:  126mL OMNIPAQUE IOHEXOL 300 MG/ML  SOLN  COMPARISON:  Remote CT 04/28/2005  FINDINGS: The included lung bases are clear.  There is a 9 mm cyst in the left lobe of the liver. No enhancing hepatic lesion. Gallbladder is physiologically distended, no calcified gallstones. No biliary dilatation. The spleen, adrenal glands, and pancreas are normal. No pancreatic ductal dilatation or surrounding inflammatory change. The previous small hypodense lesion in the spleen is no longer present. There is an 8 mm hypodensity in the anterior right kidney, unchanged from prior exam, too small to characterize. Tiny cortical hypodensity in the mid left kidney, unchanged. There is symmetric renal  enhancement and excretion.  The stomach is physiologically distended. There are no dilated or thickened bowel loops. The appendix is normal. Small volume of stool throughout the colon, no colonic wall thickening. There is a prominent sigmoid colonic diverticulum without associated inflammatory change to suggest diverticulitis, this is unchanged from prior exam. No free air, free fluid, or intra-abdominal fluid collection.  Abdominal aorta is normal in caliber with mild moderate atherosclerosis. No retroperitoneal adenopathy.  Within the pelvis the urinary bladder is physiologically distended. The uterus is normal in size. There is no adnexal mass. No pelvic free fluid. No pelvic adenopathy.  There are no acute or suspicious osseous abnormalities.  IMPRESSION: 1. No acute abnormality in the abdomen/pelvis. 2. Colonic diverticula without diverticulitis. 3. Unchanged small subcentimeter hypodensities in both kidneys, too small to characterize but likely cysts. Small hepatic cyst.   Electronically Signed   By: Jeb Levering M.D.   On: 08/04/2014 18:54   Assessment/Plan: 1.  Low blood level of cortisol.   2.  Muscle cramps, particularly in legs and worse at night.    [name] [name] had a low, unstimulated blood level of cortisol.  She did not have weight loss, hyperpigmentation, eosinophilia, hyponatremia, hypoglycemia, hypotension, anemia,  diarrhea, or some of the other manifestations possible with adrenal insufficiency.  Her symptoms have not appreciably improved despite dexamethasone or hydrocortisone.    Recommendations: Hold hydrocortisone this evening and postpone dose tomorrow morning for the following:  ACTH level before Cosyntropin.  Early morning renin and aldosterone levels.  Cosyntropin stimulation test (250 micrograms of intervenous or intramuscular Cosyntropin   followed by cortisol level 60 minutes later).  In most healthy people, cortisol level rises above 20 within sixty minutes after  Cosyntropin stimulation.    If her laboratory tests are consistent with adrenal insufficiency, she will likely require at least 15 mg of hydrocortisone per day as adrenal replacement therapy.  Consider addition of Florinef if lab tests confirm primary adrenal insufficiency.    If her laboratory tests are not consistent with adrenal insufficiency, then consider stopping hydrocortisone and consider other causes for her muscle cramps.  Luanne Krzyzanowski 08/05/2014, 7:19 PM

## 2014-08-06 DIAGNOSIS — B182 Chronic viral hepatitis C: Secondary | ICD-10-CM

## 2014-08-06 LAB — CORTISOL-AM, BLOOD: Cortisol - AM: 1.1 ug/dL — ABNORMAL LOW (ref 4.3–22.4)

## 2014-08-06 MED ORDER — CYCLOBENZAPRINE HCL 5 MG PO TABS
5.0000 mg | ORAL_TABLET | Freq: Three times a day (TID) | ORAL | Status: DC
Start: 2014-08-06 — End: 2014-08-07
  Administered 2014-08-06 – 2014-08-07 (×4): 5 mg via ORAL
  Filled 2014-08-06 (×4): qty 1

## 2014-08-06 NOTE — Progress Notes (Signed)
Subjective: Patient reports she is not feeling well today. She reports significant pain in her abdomen due to muscle cramping. She notes associated nausea. Her tachycardia has resolved.   Objective: Vital signs in last 24 hours: Filed Vitals:   08/05/14 1400 08/05/14 2208 08/06/14 0539 08/06/14 1416  BP: 150/72 136/81 134/84 133/81  Pulse: 111 96 95 71  Temp: 97.8 F (36.6 C) 98.4 F (36.9 C) 98.6 F (37 C) 98.6 F (37 C)  TempSrc: Oral Oral  Oral  Resp: '17 16 16 16  ' Height:      Weight:      SpO2: 99% 93% 92% 100%   Weight change:   Intake/Output Summary (Last 24 hours) at 08/06/14 1438 Last data filed at 08/06/14 0906  Gross per 24 hour  Intake      0 ml  Output    800 ml  Net   -800 ml   Physical Exam General: sitting up in bed, appears uncomfortable  HEENT: [place]/AT, EOMI, mucus membranes moist CV: RRR, no m/g/r Pulm: CTA bilaterally, breaths non-labored  Abd: BS+, diffusely tender to palpation, distended primarily in upper abdomen Ext: warm, no edema Neuro: alert and oriented x 3, no focal deficits   Lab Results: Basic Metabolic Panel:  Recent Labs Lab 08/04/14 1450 08/05/14 0117  NA 136 135  K 3.2* 3.9  CL 98 102  CO2 27 28  GLUCOSE 110* 172*  BUN 12 10  CREATININE 0.80 0.70  CALCIUM 8.8 8.7  MG  --  2.0  PHOS  --  3.6   Liver Function Tests:  Recent Labs Lab 08/04/14 1450  AST 19  ALT 35  ALKPHOS 96  BILITOT 0.6  PROT 7.7  ALBUMIN 3.5    Recent Labs Lab 08/04/14 1450 08/05/14 0117  LIPASE 74*  --   AMYLASE  --  145*   CBC:  Recent Labs Lab 08/04/14 1450 08/05/14 0117  WBC 10.1 9.2  NEUTROABS 7.2 6.4  HGB 14.1 12.5  HCT 43.1 38.7  MCV 80.1 79.5  PLT 223 216   Cardiac Enzymes:  Recent Labs Lab 08/04/14 2056 08/05/14 0117  CKTOTAL  --  75  CKMB  --  1.6  TROPONINI <0.03  --    Urinalysis:  Recent Labs Lab 08/04/14 1510 08/04/14 2054  COLORURINE YELLOW YELLOW  LABSPEC 1.015 <1.005*  PHURINE 6.0 6.5    GLUCOSEU NEG NEGATIVE  HGBUR NEG NEGATIVE  BILIRUBINUR Small  SMALL* NEGATIVE  KETONESUR TRACE* NEGATIVE  PROTEINUR 100  30* NEGATIVE  UROBILINOGEN 1  1.0 0.2  NITRITE Negative  NEG NEGATIVE  LEUKOCYTESUR NEG  Negative NEGATIVE    Ct Abdomen Pelvis W Contrast  08/04/2014   CLINICAL DATA:  Right upper quadrant and right lower quadrant cramping abdominal pain. Symptoms over the last few days, intensifying. Nausea. History of diverticulitis in pancreatitis.  EXAM: CT ABDOMEN AND PELVIS WITH CONTRAST  TECHNIQUE: Multidetector CT imaging of the abdomen and pelvis was performed using the standard protocol following bolus administration of intravenous contrast.  CONTRAST:  1105m OMNIPAQUE IOHEXOL 300 MG/ML  SOLN  COMPARISON:  Remote CT 04/28/2005  FINDINGS: The included lung bases are clear.  There is a 9 mm cyst in the left lobe of the liver. No enhancing hepatic lesion. Gallbladder is physiologically distended, no calcified gallstones. No biliary dilatation. The spleen, adrenal glands, and pancreas are normal. No pancreatic ductal dilatation or surrounding inflammatory change. The previous small hypodense lesion in the spleen is no longer present. There  is an 8 mm hypodensity in the anterior right kidney, unchanged from prior exam, too small to characterize. Tiny cortical hypodensity in the mid left kidney, unchanged. There is symmetric renal enhancement and excretion.  The stomach is physiologically distended. There are no dilated or thickened bowel loops. The appendix is normal. Small volume of stool throughout the colon, no colonic wall thickening. There is a prominent sigmoid colonic diverticulum without associated inflammatory change to suggest diverticulitis, this is unchanged from prior exam. No free air, free fluid, or intra-abdominal fluid collection.  Abdominal aorta is normal in caliber with mild moderate atherosclerosis. No retroperitoneal adenopathy.  Within the pelvis the urinary bladder  is physiologically distended. The uterus is normal in size. There is no adnexal mass. No pelvic free fluid. No pelvic adenopathy.  There are no acute or suspicious osseous abnormalities.  IMPRESSION: 1. No acute abnormality in the abdomen/pelvis. 2. Colonic diverticula without diverticulitis. 3. Unchanged small subcentimeter hypodensities in both kidneys, too small to characterize but likely cysts. Small hepatic cyst.   Electronically Signed   By: Jeb Levering M.D.   On: 08/04/2014 18:54   Medications: I have reviewed the patient's current medications.  Assessment/Plan:  Abdominal Cramping: Patient continues to have severe abdominal cramping. She reports the K pads and valium help the cramps. She does appear to be distended on exam and has diffuse tenderness, but CT Abd on 4/11 did not show any acute process. She has tried Robaxin in the past with minimal relief. She is getting further work up for AI, see below. AI could explain her muscle cramps. Other etiologies to consider are rheumatoid or autoimmune diseases, including RA, SLE, polymyalgia rheumatica, vasculitis. However, ANA and ANCA tests on 06/23/14 were negative. Also ESR and CRP noted to be negative.  - Start Flexeril 5 mg TID. Can increase as tolerated.  - Continue K pads - Continue Valium 5 mg IV Q8H PRN - Work up for AI as below   Adrenal Insufficiency: Patient has an AM cortisol of 1 on 06/27/14 which is suggestive of adrenal insufficiency. Patient had labs drawn this AM for cosyntropin test. However, labs were not drawn until 830 AM. This may affect the results of the test. Will await lab results before restarting hydrocortisone to confirm diagnosis of adrenal insufficiency and hopefully determine if primary vs secondary.  - Endocrinology consulted, appreciate recommendations and help with this case - f/u ACTH, cortisol, and renin aldosterone levels  - Will start hydrocortisone once levels back and confirm diagnosis   Tachycardia-  Resolved: HR now in 70-80s. Likely was related to her pain from her muscle spasms. - Continue to monitor   HIV: Had been well-controlled on HAART, then she was taken off of HAART due to an allergic reaction (rash) in 2015 while she was also getting hepatitis C therapy. Resumed HARRT therapy in 04/2014 (Prezista, Norvir, Truvada), now with CD4 400, VL <110 on 05/2014. She is followed by Dr. Johnnye Sima. 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 Treated at [facility] with Simeprevir and Sofosbuvir 01/2013, then undetectable 06/2013.  HTN: BP currently 133/81.  - Continue home Norvasc 10 mg daily   Chronic Low Back Pain: Has lumbar spondylosis without myelopathy s/p lumbar branch blocks and s/p bilateral radiofrequency ablation for arthritis and bulging disk.  - Continue Tramadol 50 mg Q6H PRN - Continue Naproxen 500 mg BID PRN  Allergic Rhinitis:  On albuterol and flonase at home.  - Continue home medications - Continue Singulair 10 mg daily at bedtime   Diet: Heart healthy VTE PPx: Lovenox SQ Dispo: Disposition is deferred at this time, awaiting improvement of current medical problems.  Anticipated discharge in approximately 2-3 day(s).   The patient does have a current PCP Cresenciano Genre, MD) and does need an [facility] hospital follow-up appointment after discharge.  The patient does not have transportation limitations that hinder transportation to clinic appointments.  .Services Needed at time of discharge: Y = Yes, Blank = No PT:   OT:   RN:   Equipment:   Other:     LOS: 1 day   Juliet Rude, MD 08/06/2014, 2:38 PM

## 2014-08-07 LAB — BASIC METABOLIC PANEL
Anion gap: 11 (ref 5–15)
BUN: 10 mg/dL (ref 6–23)
CALCIUM: 8.7 mg/dL (ref 8.4–10.5)
CHLORIDE: 102 mmol/L (ref 96–112)
CO2: 25 mmol/L (ref 19–32)
Creatinine, Ser: 0.58 mg/dL (ref 0.50–1.10)
GFR calc Af Amer: >90 mL/min (ref >90)
GFR calc non Af Amer: >90 mL/min (ref >90)
GLUCOSE: 120 mg/dL — AB (ref 70–99)
Potassium: 3.5 mmol/L (ref 3.5–5.1)
Sodium: 138 mmol/L (ref 135–145)

## 2014-08-07 LAB — CORTISOL: Cortisol, Plasma: 0.9 ug/dL

## 2014-08-07 LAB — ALDOSTERONE + RENIN ACTIVITY W/ RATIO
ALDO / PRA Ratio: 3 (ref 0.0–30.0)
Aldosterone: 7.2 ng/dL (ref 0.0–30.0)
PRA LC/MS/MS: 2.42 ng/mL/hr

## 2014-08-07 LAB — ACTH: C206 ACTH: <1.1 pg/mL — ABNORMAL LOW (ref 7.2–63.3)

## 2014-08-07 LAB — ACTH STIMULATION, 3 TIME POINTS
Cortisol, 30 Min: 8.3 ug/dL — ABNORMAL LOW (ref >20.0)
Cortisol, 60 Min: 12 ug/dL — ABNORMAL LOW (ref >20)
Cortisol, Base: 1 ug/dL

## 2014-08-07 MED ORDER — HYDROCORTISONE 20 MG PO TABS
30.0000 mg | ORAL_TABLET | Freq: Every day | ORAL | Status: AC
  Administered 2014-08-07 – 2014-08-09 (×3): 30 mg via ORAL
  Filled 2014-08-07 (×4): qty 1

## 2014-08-07 MED ORDER — BACLOFEN 10 MG PO TABS
10.0000 mg | ORAL_TABLET | Freq: Three times a day (TID) | ORAL | Status: DC
  Administered 2014-08-07 – 2014-08-11 (×13): 10 mg via ORAL
  Filled 2014-08-07 (×13): qty 1

## 2014-08-07 MED ORDER — HYDROCORTISONE 10 MG PO TABS
10.0000 mg | ORAL_TABLET | Freq: Every day | ORAL | Status: AC
  Administered 2014-08-07 – 2014-08-09 (×3): 10 mg via ORAL
  Filled 2014-08-07 (×3): qty 1

## 2014-08-07 MED ORDER — PANTOPRAZOLE SODIUM 40 MG PO TBEC
DELAYED_RELEASE_TABLET | ORAL | Status: AC
  Filled 2014-08-07: qty 1

## 2014-08-07 NOTE — Progress Notes (Signed)
Subjective: Patient reports she is still having significant muscle cramps. Flexeril did not seem to help. Her cosyntropin results are consistent with adrenal insufficiency. I discussed the remaining work up with the patient and her diagnosis of adrenal insufficiency. Hydrocortisone restarted today.   Objective: Vital signs in last 24 hours: Filed Vitals:   08/06/14 1416 08/06/14 2117 08/07/14 0500 08/07/14 1002  BP: 133/81 131/78 121/77 137/83  Pulse: 71 100 77 96  Temp: 98.6 F (37 C) 98.6 F (37 C) 97.5 F (36.4 C) 98.2 F (36.8 C)  TempSrc: Oral Oral Oral Oral  Resp: 16 19 18 18   Height:      Weight:   159 lb 7 oz (72.32 kg)   SpO2: 100% 95% 98% 96%   Weight change:   Intake/Output Summary (Last 24 hours) at 08/07/14 1144 Last data filed at 08/06/14 1849  Gross per 24 hour  Intake    600 ml  Output      0 ml  Net    600 ml   Physical Exam General: sitting up in bed, pleasant, NAD HEENT: [place]/AT, EOMI, mucus membranes moist CV: RRR, no m/g/r Pulm: CTA bilaterally, breaths non-labored Abd: BS+, distended, moderate diffuse tenderness Ext: warm, no edema Neuro: alert and oriented x 3, no focal deficits  Lab Results: Basic Metabolic Panel:  Recent Labs Lab 08/05/14 0117 08/07/14 0448  NA 135 138  K 3.9 3.5  CL 102 102  CO2 28 25  GLUCOSE 172* 120*  BUN 10 10  CREATININE 0.70 0.58  CALCIUM 8.7 8.7  MG 2.0  --   PHOS 3.6  --    Liver Function Tests:  Recent Labs Lab 08/04/14 1450  AST 19  ALT 35  ALKPHOS 96  BILITOT 0.6  PROT 7.7  ALBUMIN 3.5    Recent Labs Lab 08/04/14 1450 08/05/14 0117  LIPASE 74*  --   AMYLASE  --  145*   CBC:  Recent Labs Lab 08/04/14 1450 08/05/14 0117  WBC 10.1 9.2  NEUTROABS 7.2 6.4  HGB 14.1 12.5  HCT 43.1 38.7  MCV 80.1 79.5  PLT 223 216   Cardiac Enzymes:  Recent Labs Lab 08/04/14 2056 08/05/14 0117  CKTOTAL  --  75  CKMB  --  1.6  TROPONINI <0.03  --    Urinalysis:  Recent Labs Lab  08/04/14 1510 08/04/14 2054  COLORURINE YELLOW YELLOW  LABSPEC 1.015 <1.005*  PHURINE 6.0 6.5  GLUCOSEU NEG NEGATIVE  HGBUR NEG NEGATIVE  BILIRUBINUR Small  SMALL* NEGATIVE  KETONESUR TRACE* NEGATIVE  PROTEINUR 100  30* NEGATIVE  UROBILINOGEN 1  1.0 0.2  NITRITE Negative  NEG NEGATIVE  LEUKOCYTESUR NEG  Negative NEGATIVE   Medications: I have reviewed the patient's current medications.  Assessment/Plan:  Abdominal Cramping: Patient continues to have severe abdominal cramping. She reports the K pads and valium help the cramps. She does appear to be distended on exam and has diffuse tenderness, but CT Abd on 4/11 did not show any acute process. She has tried Robaxin in the past with minimal relief. Flexeril did not help either. Her muscle cramps are most likely related to her AI (see below). She was started on treatment for AI (hydrocortisone) so hopefully she will start to feel some relief. In the meantime we will try another muscle relaxer. - Discontinue Flexeril 5 mg TID - Start Baclofen 10 mg TID - Continue K pads - Continue Valium 5 mg IV Q8H PRN - Work up for AI as  below   Adrenal Insufficiency: Cosyntropin test consistent with diagnosis of AI. Her AM cortisol was low at 1.0 and then after cosyntropin challenge her cortisol remained below 20 at 8.3 after 30 mins and 12.0 after 60 mins. Awaiting ACTH level to determine if AI is primary or secondary. As per Dr. Cindra Eves recommendations, if ACTH low or normal will get MRI Head with and without contrast to assess pituitary gland. We will start hydrocortisone with 2-3 x the maintenance dose for 3 days and then transition to a maintenance dose. She will have follow up with Dr. Buddy Duty on 09/05/14 to adjust her hydrocortisone as necessary.  - Endocrinology consulted, appreciate recommendations and help with this case - f/u ACTH level - Hydrocortisone 30 mg at 8 AM and 10 mg at 4 PM for next 3 days - Transition to Hydrocortisone 10 mg in AM  and 5 mg in PM (start on 08/10/14)  HIV: Had been well-controlled on HAART, then she was taken off of HAART due to an allergic reaction (rash) in 2015 while she was also getting hepatitis C therapy. Resumed HARRT therapy in 04/2014 (Prezista, Norvir, Truvada), now with CD4 400, VL <110 on 05/2014. She is followed by Dr. Johnnye Sima. 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 Treated at [facility] with Simeprevir and Sofosbuvir 01/2013, then undetectable 06/2013.  HTN: Currently normotensive.  - Continue home Norvasc 10 mg daily   Chronic Low Back Pain: Has lumbar spondylosis without myelopathy s/p lumbar branch blocks and s/p bilateral radiofrequency ablation for arthritis and bulging disk.  - Continue Tramadol 50 mg Q6H PRN - Continue Naproxen 500 mg BID PRN  Allergic Rhinitis: On albuterol and flonase at home.  - Continue home medications - Continue Singulair 10 mg daily at bedtime   Diet: Regular  VTE PPx: Lovenox SQ Dispo: Disposition is deferred at this time, awaiting improvement of current medical problems.  Anticipated discharge in approximately 1-2 day(s).   The patient does have a current PCP Cresenciano Genre, MD) and does need an [facility] hospital follow-up appointment after discharge.  The patient does not have transportation limitations that hinder transportation to clinic appointments.  .Services Needed at time of discharge: Y = Yes, Blank = No PT:   OT:   RN:   Equipment:   Other:     LOS: 2 days   Juliet Rude, MD 08/07/2014, 11:44 AM

## 2014-08-07 NOTE — Progress Notes (Signed)
Subjective: [name] had abdominal cramps again within the past 24 hours, but this has resolved.  No appreciable improvement or worsening since the interruption in her hydrocortisone.  Review of systems: "a lot of nausea", no diarrhea, no hypotension, no vomiting.  Objective: Vital signs in last 24 hours: Temp:  [97.5 F (36.4 C)-98.6 F (37 C)] 97.5 F (36.4 C) (04/14 0500) Pulse Rate:  [71-100] 77 (04/14 0500) Resp:  [16-19] 18 (04/14 0500) BP: (121-133)/(77-81) 121/77 mmHg (04/14 0500) SpO2:  [95 %-100 %] 98 % (04/14 0500) Weight:  [72.32 kg (159 lb 7 oz)] 72.32 kg (159 lb 7 oz) (04/14 0500) Weight change:  Last BM Date: 08/06/14  Intake/Output from previous day: 04/13 0701 - 04/14 0700 In: 600 [P.O.:600] Out: -  Intake/Output this shift:   General: No apparent distress. Eyes: Extraocular eye movements intact, lateral visual fields intact on confrontation testing. Neck: Supple, trachea midline. Thyroid: No appreciable enlargement, but thyroid gland is partially substernal which limits the exam; mobile without fixation, no tenderness. Cardiovascular: Regular rhythm and rate, no edema, normal radial pulses. Respiratory: Normal respiratory effort, clear to auscultation. Gastrointestinal: Normal pitch active bowel sounds, abdomen without appreciable hepatomegaly. Neurologic: Cranial nerves normal as tested. Musculoskeletal: Normal muscle tone, no muscle atrophy. Skin: Appropriate warmth, no pathologic hyperpigmentation. Mental status: Alert, conversant, speech clear, thought logical, mildly restricted affect, no hallucinations or delusions evident. Hematologic/lymphatic: No cervical adenopathy, no visible ecchymoses or petechia.   Lab Results:  Recent Labs  08/04/14 1450 08/05/14 0117  WBC 10.1 9.2  HGB 14.1 12.5  HCT 43.1 38.7  PLT 223 216   BMET  Recent Labs  08/05/14 0117 08/07/14 0448  NA 135 138  K 3.9 3.5  CL 102 102  CO2 28 25  GLUCOSE 172* 120*   BUN 10 10  CREATININE 0.70 0.58  CALCIUM 8.7 8.7   Cortisol (08/06/2014): Baseline: 1 60 minutes after Cosyntropin: 12  Medications: I have reviewed the patient's current medications.  Assessment/Plan: 1.  Adrenal insufficiency.  The patient agreed to obtain medical alert jewelry stating "adrenal insufficiency".  She also agreed to keep her scheduled clinic visit with me on 09/05/2014 arriving at noon.  Education given including a patient handout from the [place] on adrenal insufficiency.  For replacement therapy when not ill or injured, she will likely require 15-20 mg of hydrocortisone divided into 2 or 3 doses per day.  For at least the next 3 days, I recommend two or three times the anticipated maintenance dose.  After those first 3 days, if clinically stable, then may reduce to 10 mg by mouth each morning around 6AM to 8AM and 5 mg by mouth each afternoon around 2PM to 4PM.  As an outpatient, we can adjust the doses and the timing of the afternoon dose--as clinically indicated.    If her ACTH level is high, then this suggests primary adrenal insufficiency.  If her ACTH level is low or "normal" (within the reference range), then this suggests secondary adrenal insufficiency.  Secondary adrenal insufficiency is common after repeated or prolonged exposure to exogenous corticosteroid therapy.  If secondary adrenal insufficiency, then I recommend an MRI of head without and with contrast with attention to pituitary gland.    I discussed the lab findings, diagnosis, and recommendations in detail this morning with Dr. Albin Felling and the patient's nurse.  Dr. Arcelia Jew agreed to order the hydrocortisone.    If there are any questions or concerns, please do not hesitate to  call me.     LOS: 2 days   Carlyann Placide 08/07/2014, 7:51 AM

## 2014-08-08 ENCOUNTER — Inpatient Hospital Stay (HOSPITAL_COMMUNITY): Payer: Medicaid Other

## 2014-08-08 DIAGNOSIS — Z7951 Long term (current) use of inhaled steroids: Secondary | ICD-10-CM

## 2014-08-08 DIAGNOSIS — D352 Benign neoplasm of pituitary gland: Secondary | ICD-10-CM | POA: Diagnosis present

## 2014-08-08 DIAGNOSIS — Z79899 Other long term (current) drug therapy: Secondary | ICD-10-CM

## 2014-08-08 DIAGNOSIS — E876 Hypokalemia: Secondary | ICD-10-CM | POA: Diagnosis present

## 2014-08-08 DIAGNOSIS — E2749 Other adrenocortical insufficiency: Secondary | ICD-10-CM

## 2014-08-08 HISTORY — DX: Benign neoplasm of pituitary gland: D35.2

## 2014-08-08 LAB — CBC
HEMATOCRIT: 38.9 % (ref 36.0–46.0)
HEMOGLOBIN: 12.6 g/dL (ref 12.0–15.0)
MCH: 26.4 pg (ref 26.0–34.0)
MCHC: 32.4 g/dL (ref 30.0–36.0)
MCV: 81.4 fL (ref 78.0–100.0)
Platelets: 232 10*3/uL (ref 150–400)
RBC: 4.78 MIL/uL (ref 3.87–5.11)
RDW: 17.5 % — AB (ref 11.5–15.5)
WBC: 10.9 10*3/uL — ABNORMAL HIGH (ref 4.0–10.5)

## 2014-08-08 LAB — BASIC METABOLIC PANEL
Anion gap: 12 (ref 5–15)
BUN: 6 mg/dL (ref 6–23)
CALCIUM: 8.6 mg/dL (ref 8.4–10.5)
CO2: 24 mmol/L (ref 19–32)
Chloride: 100 mmol/L (ref 96–112)
Creatinine, Ser: 0.6 mg/dL (ref 0.50–1.10)
GFR calc Af Amer: >90 mL/min (ref >90)
GFR calc non Af Amer: >90 mL/min (ref >90)
Glucose, Bld: 145 mg/dL — ABNORMAL HIGH (ref 70–99)
POTASSIUM: 3.2 mmol/L — AB (ref 3.5–5.1)
SODIUM: 136 mmol/L (ref 135–145)

## 2014-08-08 LAB — ALDOSTERONE + RENIN ACTIVITY W/ RATIO
ALDO / PRA Ratio: 2.7 (ref 0.0–30.0)
Aldosterone: 9.5 ng/dL (ref 0.0–30.0)
PRA LC/MS/MS: 3.55 ng/mL/h

## 2014-08-08 MED ORDER — MOMETASONE FUROATE 50 MCG/ACT NA SUSP
2.0000 | Freq: Every day | NASAL | Status: DC
  Administered 2014-08-09 – 2014-08-10 (×2): 2 via NASAL
  Filled 2014-08-08: qty 17

## 2014-08-08 MED ORDER — DIAZEPAM 5 MG PO TABS: 5.0000 mg | ORAL_TABLET | Freq: Three times a day (TID) | ORAL | Status: DC | PRN

## 2014-08-08 MED ORDER — POTASSIUM CHLORIDE CRYS ER 20 MEQ PO TBCR
40.0000 meq | EXTENDED_RELEASE_TABLET | Freq: Once | ORAL | Status: DC
  Filled 2014-08-08: qty 2

## 2014-08-08 MED ORDER — BECLOMETHASONE DIPROP MONOHYD 42 MCG/SPRAY NA SUSP
1.0000 | Freq: Two times a day (BID) | NASAL | Status: DC | PRN
  Filled 2014-08-08: qty 25

## 2014-08-08 MED ORDER — FLUTICASONE PROPIONATE 50 MCG/ACT NA SUSP
1.0000 | Freq: Two times a day (BID) | NASAL | Status: DC
  Filled 2014-08-08: qty 16

## 2014-08-08 MED ORDER — GADOBENATE DIMEGLUMINE 529 MG/ML IV SOLN
10.0000 mL | Freq: Once | INTRAVENOUS | Status: AC
  Administered 2014-08-08: 10 mL via INTRAVENOUS

## 2014-08-08 MED ORDER — MOMETASONE FUROATE 50 MCG/ACT NA SUSP
1.0000 | Freq: Every day | NASAL | Status: DC
Start: 2014-08-09 — End: 2014-08-08
  Filled 2014-08-08: qty 17

## 2014-08-08 MED ORDER — SODIUM CHLORIDE 0.9 % IV SOLN
INTRAVENOUS | Status: DC
  Administered 2014-08-08 – 2014-08-10 (×4): via INTRAVENOUS

## 2014-08-08 MED ORDER — MOMETASONE FUROATE 50 MCG/ACT NA SUSP
2.0000 | Freq: Every day | NASAL | Status: DC
  Filled 2014-08-08: qty 17

## 2014-08-08 MED ORDER — HYDROCORTISONE NA SUCCINATE PF 100 MG IJ SOLR
50.0000 mg | Freq: Once | INTRAMUSCULAR | Status: AC
  Administered 2014-08-08: 50 mg via INTRAVENOUS
  Filled 2014-08-08: qty 2

## 2014-08-08 MED ORDER — POTASSIUM CHLORIDE 20 MEQ/15ML (10%) PO SOLN
40.0000 meq | Freq: Once | ORAL | Status: AC
  Administered 2014-08-08: 40 meq via ORAL
  Filled 2014-08-08: qty 30

## 2014-08-08 MED ORDER — MOMETASONE FUROATE 50 MCG/ACT NA SUSP
2.0000 | Freq: Once | NASAL | Status: AC
  Administered 2014-08-08: 2 via NASAL
  Filled 2014-08-08: qty 17

## 2014-08-08 MED ORDER — BECLOMETHASONE DIPROP MONOHYD 42 MCG/SPRAY NA SUSP
1.0000 | Freq: Two times a day (BID) | NASAL | Status: DC
  Filled 2014-08-08: qty 25

## 2014-08-08 NOTE — Plan of Care (Signed)
Problem: Phase I Progression Outcomes Goal: OOB as tolerated unless otherwise ordered Outcome: Progressing Up to [facility]

## 2014-08-08 NOTE — Progress Notes (Signed)
Subjective: [name] [name] has less nausea and muscle cramps now, but still some nocturnal leg cramps.  "I just don't feel as bad as I used to."  "Not as dizzy as I was"    Review of systems: Recent increase in shoe size, her ring seems tight rather than loose.  No galactorrhea or breast tenderness.  Objective: Vital signs in last 24 hours: Temp:  [97.6 F (36.4 C)-98.2 F (36.8 C)] 98.2 F (36.8 C) (04/15 1300) Pulse Rate:  [77-115] 110 (04/15 1300) Resp:  [17-19] 17 (04/15 1300) BP: (136-150)/(71-95) 140/95 mmHg (04/15 1300) SpO2:  [98 %-99 %] 99 % (04/15 1300) Weight:  [75.978 kg (167 lb 8 oz)] 75.978 kg (167 lb 8 oz) (04/15 0521) Weight change: 3.657 kg (8 lb 1 oz) Last BM Date: 08/07/14  Intake/Output from previous day: 04/14 0701 - 04/15 0700 In: 920 [P.O.:920] Out: -  Intake/Output this shift:   General: No apparent distress. Eyes: Extraocular eye movements intact, lateral visual fields intact on confrontation testing. Neck: Supple, trachea midline. Thyroid: No appreciable enlargement, but thyroid gland is partially substernal which limits the exam; mobile without fixation, no tenderness. Cardiovascular: Regular rhythm and rate, normal radial pulses. Respiratory: Normal respiratory effort, clear to auscultation. Gastrointestinal: Normal pitch active bowel sounds, abdomen without appreciable hepatomegaly, but mild tenderness near liver edge. Neurologic: Cranial nerves normal as tested. Musculoskeletal: Normal muscle tone, no muscle atrophy. Skin: Appropriate warmth, no pathologic hyperpigmentation. Mental status: Alert, conversant, speech clear, thought logical, mildly restricted affect, no hallucinations or delusions evident. Hematologic/lymphatic: No cervical adenopathy, no visible ecchymoses or petechia.   Lab Results:  Recent Labs  08/08/14 1120  WBC 10.9*  HGB 12.6  HCT 38.9  PLT 232   BMET  Recent Labs  08/07/14 0448 08/08/14 0504  NA 138 136  K 3.5  3.2*  CL 102 100  CO2 25 24  GLUCOSE 120* 145*  BUN 10 6  CREATININE 0.58 0.60  CALCIUM 8.7 8.6   Medications: I have reviewed the patient's current medications.  Her ACTH level was inappropriately low, but renal and aldosterone were normal.  Assessment/Plan: 1.  Central (secondary or tertiary) adrenal insufficiency. 2.  Pituitary microadenoma.  She again agreed to keep her scheduled clinic visit with me on 09/05/2014 arriving at noon.  Education given including a patient handout from the [place] on pituitary tumors.  Today I reviewed laboratory reports, MRI report, and MRI images.  I gave [name] [name] a printed copy of her MRI radiology report and two key pituitary MRI images.    A pituitary tumor greater than 1 cm may increase pressure on the normal pituitary tissue and suppress normal pituitary function.  However, this would not be expected with a 4 mm pituitary tumor.    [name] [name] primary care physician, Dr. Aundra Dubin, discovered that the patient's physical medicine rehabilitation physician provided multiple corticosteroid doses within the past year.  Available records show administration of 4 mg of dexamethasone on 10/17/2013, 12/12/2013, and 01/21/2014.  Note that 4 mg of dexamethasone is comparable to 107 mg of hydrocortisone.  Likely the patient's central adrenal insufficiency is from corticosteroid therapy.    She seems to be improving with oral stress dose hydrocortisone.  If clinically stable, may taper down to 10 mg of hydrocortisone each morning and 5 mg of hydrocortisone each afternoon after another day or two of oral stress dose hydrocortisone.  At the discretion of the internal medicine team, may consider 50 mg or 100 mg of  intravenous hydrocortisone as a single dose now--in case this might expedite her recovery.  However, will also need to supplement potassium since her hypokalemia may worsen with hydrocortisone therapy.  I spoke with Dr. Randell Patient about the  findings, interpretation, and care plan in detail.  I will leave the hydrocortisone orders, potassium replacement, and discharge planning to the discretion of the Internal Medicine Teaching Service.   If there are any questions or concerns, please do not hesitate to call me.     LOS: 3 days   Hendrik Donath 08/08/2014, 1:17 PM

## 2014-08-08 NOTE — Progress Notes (Signed)
Subjective: She reports she has nausea which is chronic. Reports her pain is improved but the cramping is still there. Last BM yesterday AM. Denies leg cramps, dysuria, dizziness. Tolerating PO intake.  Objective: Vital signs in last 24 hours: Filed Vitals:   08/07/14 1002 08/07/14 1400 08/07/14 2100 08/08/14 0521  BP: 137/83 140/71 150/92 136/84  Pulse: 96 77 115 111  Temp: 98.2 F (36.8 C) 98.1 F (36.7 C) 98.1 F (36.7 C) 97.6 F (36.4 C)  TempSrc: Oral Oral Oral Oral  Resp: 18 18 18 19   Height:      Weight:    167 lb 8 oz (75.978 kg)  SpO2: 96% 98% 98% 99%   Weight change: 8 lb 1 oz (3.657 kg)  Intake/Output Summary (Last 24 hours) at 08/08/14 0728 Last data filed at 08/08/14 0523  Gross per 24 hour  Intake    920 ml  Output      0 ml  Net    920 ml   Physical Exam General: sitting up in bed, pleasant, NAD HEENT: [place]/AT, EOMI, mucus membranes moist CV: RRR, no m/g/r Pulm: CTA bilaterally, breaths non-labored Abd: BS+, distended, mild diffuse tenderness Ext: warm, no edema Neuro: alert and oriented x 3, no focal deficits  Lab Results: Basic Metabolic Panel:  Recent Labs Lab 08/05/14 0117 08/07/14 0448 08/08/14 0504  NA 135 138 136  K 3.9 3.5 3.2*  CL 102 102 100  CO2 28 25 24   GLUCOSE 172* 120* 145*  BUN 10 10 6   CREATININE 0.70 0.58 0.60  CALCIUM 8.7 8.7 8.6  MG 2.0  --   --   PHOS 3.6  --   --    Liver Function Tests:  Recent Labs Lab 08/04/14 1450  AST 19  ALT 35  ALKPHOS 96  BILITOT 0.6  PROT 7.7  ALBUMIN 3.5    Recent Labs Lab 08/04/14 1450 08/05/14 0117  LIPASE 74*  --   AMYLASE  --  145*   CBC:  Recent Labs Lab 08/04/14 1450 08/05/14 0117  WBC 10.1 9.2  NEUTROABS 7.2 6.4  HGB 14.1 12.5  HCT 43.1 38.7  MCV 80.1 79.5  PLT 223 216   Cardiac Enzymes:  Recent Labs Lab 08/04/14 2056 08/05/14 0117  CKTOTAL  --  75  CKMB  --  1.6  TROPONINI <0.03  --    Urinalysis:  Recent Labs Lab 08/04/14 1510  08/04/14 2054  COLORURINE YELLOW YELLOW  LABSPEC 1.015 <1.005*  PHURINE 6.0 6.5  GLUCOSEU NEG NEGATIVE  HGBUR NEG NEGATIVE  BILIRUBINUR Small  SMALL* NEGATIVE  KETONESUR TRACE* NEGATIVE  PROTEINUR 100  30* NEGATIVE  UROBILINOGEN 1  1.0 0.2  NITRITE Negative  NEG NEGATIVE  LEUKOCYTESUR NEG  Negative NEGATIVE   Medications: I have reviewed the patient's current medications.  Assessment/Plan:  Tachycardia: HR 110s. BP wnl. -Recheck Hbg -Check orthostatics  Abdominal Cramping: She reports the K pads and valium help the cramps. CT Abd on 4/11 did not show any acute process. Her muscle cramps are most likely related to her AI (see below). She was started on treatment for AI (hydrocortisone). Improving. 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 Her AM cortisol was low at 1.0 and then after cosyntropin challenge her cortisol remained below 20 at 8.3 after 30 mins and 12.0 after 60  mins. ACTH low. 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 MRI Head with and without contrast with 3.70mm delayed enhancement concerning for microadenoma, but unlikely to be cause per Dr. Buddy Duty. She will have follow up with Dr. Buddy Duty on 09/05/14 to adjust her hydrocortisone as necessary.  - Endocrinology consulted, appreciate recommendations - Hydrocortisone 30 mg at 8 AM and 10 mg at 4 PM for next 2 days - Transition to Hydrocortisone 10 mg in AM and 5 mg in PM (start on 08/10/14)  HIV: Had been well-controlled on HAART, then she was taken off of HAART due to an allergic reaction (rash) in 2015 while she was also getting hepatitis C therapy. Resumed HARRT therapy in 04/2014 (Prezista, Norvir, Truvada), now with CD4 400, VL <110 on 05/2014. She is followed by Dr. Johnnye Sima. - Continue home Prezista, Norvir, and Truvada - Continue phenergan PRN nausea/vomiting   Hepatitis C 1b: Had a biopsy  03/2012, with no evidence of cirrhosis. Treated at [facility] with Simeprevir and Sofosbuvir 01/2013, then undetectable 06/2013.  HTN: Currently normotensive.  - Continue home Norvasc 10 mg daily   Chronic Low Back Pain: Has lumbar spondylosis without myelopathy s/p lumbar branch blocks and s/p bilateral radiofrequency ablation for arthritis and bulging disk.  - Continue Tramadol 50 mg Q6H PRN - Continue Naproxen 500 mg BID PRN  Allergic Rhinitis: On albuterol and flonase at home. Stopped flonase. - Continue home medications - Continue Singulair 10 mg daily at bedtime  - Start beclomethasone 1 spray BID  Diet: Regular  VTE PPx: Lovenox SQ Dispo: Disposition is deferred at this time, awaiting improvement of current medical problems.  Anticipated discharge in approximately 1-2 day(s).   The patient does have a current PCP Cresenciano Genre, MD) and does need an [facility] hospital follow-up appointment after discharge.  The patient does not have transportation limitations that hinder transportation to clinic appointments.  .Services Needed at time of discharge: Y = Yes, Blank = No PT:   OT:   RN:   Equipment:   Other:     LOS: 3 days   Milagros Loll, MD 08/08/2014, 7:28 AM

## 2014-08-09 LAB — CBC
HEMATOCRIT: 38.7 % (ref 36.0–46.0)
Hemoglobin: 12.4 g/dL (ref 12.0–15.0)
MCH: 25.9 pg — ABNORMAL LOW (ref 26.0–34.0)
MCHC: 32 g/dL (ref 30.0–36.0)
MCV: 80.8 fL (ref 78.0–100.0)
PLATELETS: 234 10*3/uL (ref 150–400)
RBC: 4.79 MIL/uL (ref 3.87–5.11)
RDW: 17.3 % — ABNORMAL HIGH (ref 11.5–15.5)
WBC: 9.9 10*3/uL (ref 4.0–10.5)

## 2014-08-09 LAB — BASIC METABOLIC PANEL
Anion gap: 15 (ref 5–15)
BUN: 7 mg/dL (ref 6–23)
CO2: 19 mmol/L (ref 19–32)
CREATININE: 0.59 mg/dL (ref 0.50–1.10)
Calcium: 8.5 mg/dL (ref 8.4–10.5)
Chloride: 103 mmol/L (ref 96–112)
GFR calc non Af Amer: >90 mL/min (ref >90)
Glucose, Bld: 123 mg/dL — ABNORMAL HIGH (ref 70–99)
POTASSIUM: 3.4 mmol/L — AB (ref 3.5–5.1)
Sodium: 137 mmol/L (ref 135–145)

## 2014-08-09 MED ORDER — METOPROLOL TARTRATE 12.5 MG HALF TABLET
12.5000 mg | ORAL_TABLET | Freq: Two times a day (BID) | ORAL | Status: DC
  Administered 2014-08-09 – 2014-08-11 (×5): 12.5 mg via ORAL
  Filled 2014-08-09 (×5): qty 1

## 2014-08-09 MED ORDER — METOPROLOL TARTRATE 25 MG PO TABS: 12.5000 mg | ORAL_TABLET | Freq: Two times a day (BID) | ORAL | Status: DC

## 2014-08-09 MED ORDER — BACLOFEN 10 MG PO TABS: 10.0000 mg | ORAL_TABLET | Freq: Three times a day (TID) | ORAL | Status: DC

## 2014-08-09 MED ORDER — AMLODIPINE BESYLATE 5 MG PO TABS: 5.0000 mg | ORAL_TABLET | Freq: Every day | ORAL | Status: DC

## 2014-08-09 MED ORDER — BECLOMETHASONE DIPROP MONOHYD 42 MCG/SPRAY NA SUSP
2.0000 | Freq: Two times a day (BID) | NASAL | Status: DC
Start: 2014-08-09 — End: 2014-08-11

## 2014-08-09 MED ORDER — AMLODIPINE BESYLATE 5 MG PO TABS
5.0000 mg | ORAL_TABLET | Freq: Every day | ORAL | Status: DC
  Administered 2014-08-10 – 2014-08-11 (×2): 5 mg via ORAL
  Filled 2014-08-09 (×2): qty 1

## 2014-08-09 MED ORDER — HYDROCORTISONE 10 MG PO TABS: ORAL_TABLET | ORAL | Status: DC

## 2014-08-09 MED ORDER — HYDROCORTISONE 5 MG PO TABS: ORAL_TABLET | ORAL | Status: DC

## 2014-08-09 NOTE — Discharge Instructions (Signed)
Addison disease is a glandular (endocrine) or hormonal disorder. It is also called adrenal insufficiency. It affects about 1 in 100,000 people. It can affect men and women in all age groups. This disease occurs when the adrenal glands do not make enough of the hormone cortisol. In some cases, the adrenal glands also do not make the hormonealdosterone. Without the right levels of these hormones, your body cannot maintain critical life functions.  The adrenal glands are located just above the kidneys. Cortisol is a steroid hormone. Its most important job is to help the body respond to stress.  Cortisol also helps the body to:  Maintain blood pressure and heart (cardiovascular) function.  Slow the immune system's response to inflammation.  Control the use of proteins, carbohydrates (sugars), and fats.  Maintain a sense of well-being. CAUSES  A lack of cortisol can happen for different reasons.   Secondary adrenal insufficiency. The pituitary gland may not make enough of a hormone called ACTH (adrenocorticotropin). This hormone causes the adrenal glands to produce cortisol. Usually, there is enough aldosterone.  Secondary Adrenal Insufficiency This form is much more common than the primary form. It can be traced to a lack of ACTH. Without ACTH, the adrenal glands cannot make cortisol. Causes include:  Diseases of the pituitary gland. This is a small gland in the brain that controls many important body functions. The pituitary gland produces ACTH, among other hormones. Pituitary gland tumors, injury, or surgery can cause inadequate ACTH production, which causes inadequate cortisol production.  Medications:  Megestrol (used for cancer treatment and to stimulate appetite) and some pain medications can impair production of cortisol.  Use of cortisol medication (steroids) causes your adrenal glands to not produce cortisol. When you stop this medication, it can take time for the adrenal glands to  start producing cortisol again. This can be a dangerous situation, requiring slowly reducing your cortisol medicine. SYMPTOMS  Symptoms of adrenal insufficiency normally begin slowly. Problems seen with the disease are:  Severe tiredness (fatigue).  Muscle weakness.  Loss of appetite.  Weight loss.   About 50% of the time, the following symptoms occur:   Nausea, vomiting and diarrhea.  Drops in blood pressure.  Dizziness or fainting.  Darkening of the skin (with primary disease only).  Being easily angered (irritable).  Depression.  Salt craving.  Low blood sugar (hypoglycemia).  Irregular or no menstrual periods.   The symptoms slowly get worse. They are often ignored until a stressful event like an illness or an accident occurs. This is called an Addisonian crisis, or acute adrenal insufficiency. Without treatment, an Addisonian crisis can cause death.  Symptoms of an Addisonian crisis include:  Sudden, severe pain in the lower back, abdomen, or legs.  Severe vomiting and diarrhea.  Dehydration.  Low blood pressure.  Loss of consciousness.  TREATMENT  With proper treatment, you can live a normal life with Addison disease or adrenal insufficiency.  Missing hormones need to be replaced in order to treat Addison disease. Cortisol is replaced with hydrocortisone tablets taken by mouth. Other forms of cortisol may also be used. Since cortisone levels normally are higher in the morning and lower in the evening, you may need different doses at different times of the day. Be sure to follow your instructions carefully.  If your body cannot maintain the right levels of salt (sodium) and fluids because of too little aldosterone, you will also be given a medication. This drug replaces aldosterone. Important points about treatment:  Any sudden (  acute) illness can increase your body's need for cortisol. Surgery, or other stress on the body, can do the same. If you have  Addison disease and you are ill or having surgery, you will need an increase in hormone medication to prevent an Addisonian crisis. Untreated, an Addisonian crisis can cause death.  If you are too ill to take your medication or you cannot keep it down, you must take medicine through a shot (injection). You or someone who lives with you will need to learn how to give you this injection. The shot will take the place of hydrocortisone. If you find it necessary to give yourself injectable medication, call your caregiver right away, or go to the nearest hospital emergency room. HOME CARE INSTRUCTIONS   Always carry an identification card stating your condition in case of an emergency. The card should:  Alert emergency personnel about the need to inject 100 mg of cortisol if you are severely hurt or cannot respond.  Include your caregiver's name and phone number.  Include the name and number of your closest relative to contact.  When traveling, carry a needle, syringe, and an injectable form of cortisol for emergencies.  Know how to increase medication during periods of stress or mild colds.  Wear a warning bracelet or neck chain to alert emergency personnel. Many companies sell medical ID products.  Take your medications as prescribed. Do not stop your medications without medical supervision.  Learn about your condition. Ask your caregiver for further resources. This is a potentially dangerous, but easily managed illness. SEEK MEDICAL CARE IF:   You have Addison disease and you are in need of surgery.  You have Addison disease and you have an acute illness.  You have weakness, weight loss, or other unexplained symptoms. SEEK IMMEDIATE MEDICAL CARE IF:   You have symptoms of crisis:  Sudden, severe pain in the lower back, abdomen, or legs.  Severe vomiting and diarrhea.  Dehydration.  Low blood pressure.  Loss of consciousness.  You are experiencing severe:  Infections or other  illness.  Vomiting.  Diarrhea. Document Released: 04/11/2005 Document Revised: 08/26/2013 Document Reviewed: 09/02/2008 [facility] Patient Information 2015 [place], [place]. This information is not intended to replace advice given to you by your health care provider. Make sure you discuss any questions you have with your health care provider.

## 2014-08-09 NOTE — Plan of Care (Signed)
Problem: Phase I Progression Outcomes Goal: Pain controlled with appropriate interventions Outcome: Progressing Ultram seem to work well for patient's pain complaints

## 2014-08-09 NOTE — Progress Notes (Signed)
Subjective: She reports she is doing well. Her cramping is less frequent. She is tolerating PO. Denies CP or SOB.  Objective: Vital signs in last 24 hours: Filed Vitals:   08/08/14 0521 08/08/14 1300 08/08/14 2140 08/09/14 0557  BP: 136/84 140/95 150/84 156/86  Pulse: 111 110 118 124  Temp: 97.6 F (36.4 C) 98.2 F (36.8 C) 97.7 F (36.5 C) 97.8 F (36.6 C)  TempSrc: Oral Oral Oral Oral  Resp: 19 17 18 20   Height:      Weight: 167 lb 8 oz (75.978 kg)   169 lb 9.6 oz (76.93 kg)  SpO2: 99% 99% 96% 98%   Weight change: 2 lb 1.6 oz (0.953 kg)  Intake/Output Summary (Last 24 hours) at 08/09/14 1112 Last data filed at 08/09/14 0939  Gross per 24 hour  Intake   1048 ml  Output      0 ml  Net   1048 ml   Physical Exam General: sitting up in bed, pleasant, NAD HEENT: [place]/AT, EOMI, mucus membranes moist CV: Tachycardic, no m/g/r Pulm: CTA bilaterally, breaths non-labored Abd: BS+, distended, mild diffuse tenderness Ext: warm, no edema Neuro: alert and oriented x 3, no focal deficits  Lab Results: Basic Metabolic Panel:  Recent Labs Lab 08/05/14 0117  08/08/14 0504 08/09/14 0444  NA 135  < > 136 137  K 3.9  < > 3.2* 3.4*  CL 102  < > 100 103  CO2 28  < > 24 19  GLUCOSE 172*  < > 145* 123*  BUN 10  < > 6 7  CREATININE 0.70  < > 0.60 0.59  CALCIUM 8.7  < > 8.6 8.5  MG 2.0  --   --   --   PHOS 3.6  --   --   --   < > = values in this interval not displayed. Liver Function Tests:  Recent Labs Lab 08/04/14 1450  AST 19  ALT 35  ALKPHOS 96  BILITOT 0.6  PROT 7.7  ALBUMIN 3.5    Recent Labs Lab 08/04/14 1450 08/05/14 0117  LIPASE 74*  --   AMYLASE  --  145*   CBC:  Recent Labs Lab 08/04/14 1450 08/05/14 0117 08/08/14 1120 08/09/14 0444  WBC 10.1 9.2 10.9* 9.9  NEUTROABS 7.2 6.4  --   --   HGB 14.1 12.5 12.6 12.4  HCT 43.1 38.7 38.9 38.7  MCV 80.1 79.5 81.4 80.8  PLT 223 216 232 234   Cardiac Enzymes:  Recent Labs Lab 08/04/14 2056  08/05/14 0117  CKTOTAL  --  75  CKMB  --  1.6  TROPONINI <0.03  --    Urinalysis:  Recent Labs Lab 08/04/14 1510 08/04/14 2054  COLORURINE YELLOW YELLOW  LABSPEC 1.015 <1.005*  PHURINE 6.0 6.5  GLUCOSEU NEG NEGATIVE  HGBUR NEG NEGATIVE  BILIRUBINUR Small  SMALL* NEGATIVE  KETONESUR TRACE* NEGATIVE  PROTEINUR 100  30* NEGATIVE  UROBILINOGEN 1  1.0 0.2  NITRITE Negative  NEG NEGATIVE  LEUKOCYTESUR NEG  Negative NEGATIVE   Medications: I have reviewed the patient's current medications.  Assessment/Plan:  Tachycardia: EKG with sinus tachycardia. She has had tachycardia at past clinic visits. Orthostatics negative. Hgb stable. - Decrease amlodipine from 10mg  to 5mg  daily as amlodipine may cause tachycardia - Add metoprolol 12.5mg  BID - Telemetry monitoring  Abdominal Cramping: She reports the K pads and valium help the cramps. CT Abd on 4/11 did not show any acute process. Her muscle cramps are  most likely related to her AI (see below). She was started on treatment for AI (hydrocortisone). Improving. 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 Her AM cortisol was low at 1.0 and then after cosyntropin challenge her cortisol remained below 20 at 8.3 after 30 mins and 12.0 after 60 mins. ACTH low. 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 MRI Head with and without contrast with 3.20mm delayed enhancement concerning for microadenoma, but unlikely to be cause per Dr. Buddy Duty. She will have follow up with Dr. Buddy Duty on 09/05/14 to adjust her hydrocortisone as necessary.  - Endocrinology consulted, appreciate recommendations - Hydrocortisone 30 mg at 8 AM and 10 mg at 4 PM  - Transition to Hydrocortisone 10 mg in AM and 5 mg in PM (start on 08/10/14)  HIV: Had been well-controlled on HAART, then she was taken off of HAART due to an allergic reaction (rash) in 2015 while  she was also getting hepatitis C therapy. Resumed HARRT therapy in 04/2014 (Prezista, Norvir, Truvada), now with CD4 400, VL <110 on 05/2014. She is followed by Dr. Johnnye Sima. 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 Treated at [facility] with Simeprevir and Sofosbuvir 01/2013, then undetectable 06/2013.  Chronic Low Back Pain: Has lumbar spondylosis without myelopathy s/p lumbar branch blocks and s/p bilateral radiofrequency ablation for arthritis and bulging disk.  - Continue Tramadol 50 mg Q6H PRN - Continue Naproxen 500 mg BID PRN  Allergic Rhinitis: On albuterol and flonase at home. Stopped flonase. - Continue home medications - Continue Singulair 10 mg daily at bedtime  - Discharge with beclomethasone 1 spray BID  Diet: Regular  VTE PPx: Lovenox SQ  Dispo: Disposition is deferred at this time, awaiting improvement of current medical problems.  Anticipated discharge in approximately 0-1 day(s).   The patient does have a current PCP Cresenciano Genre, MD) and does need an [facility] hospital follow-up appointment after discharge.  The patient does not have transportation limitations that hinder transportation to clinic appointments.  .Services Needed at time of discharge: Y = Yes, Blank = No PT:   OT:   RN:   Equipment:   Other:     LOS: 4 days   Milagros Loll, MD 08/09/2014, 11:12 AM

## 2014-08-10 DIAGNOSIS — E273 Drug-induced adrenocortical insufficiency: Secondary | ICD-10-CM

## 2014-08-10 DIAGNOSIS — T380X5A Adverse effect of glucocorticoids and synthetic analogues, initial encounter: Secondary | ICD-10-CM

## 2014-08-10 LAB — BASIC METABOLIC PANEL
ANION GAP: 14 (ref 5–15)
BUN: 7 mg/dL (ref 6–23)
CALCIUM: 8.5 mg/dL (ref 8.4–10.5)
CHLORIDE: 104 mmol/L (ref 96–112)
CO2: 21 mmol/L (ref 19–32)
Creatinine, Ser: 0.58 mg/dL (ref 0.50–1.10)
GFR calc non Af Amer: >90 mL/min (ref >90)
Glucose, Bld: 106 mg/dL — ABNORMAL HIGH (ref 70–99)
Potassium: 3.9 mmol/L (ref 3.5–5.1)
Sodium: 139 mmol/L (ref 135–145)

## 2014-08-10 MED ORDER — HYDROCORTISONE 5 MG PO TABS
5.0000 mg | ORAL_TABLET | Freq: Every evening | ORAL | Status: DC
  Administered 2014-08-10: 5 mg via ORAL
  Filled 2014-08-10 (×2): qty 1

## 2014-08-10 MED ORDER — HYDROCORTISONE 10 MG PO TABS
10.0000 mg | ORAL_TABLET | Freq: Every day | ORAL | Status: DC
  Administered 2014-08-11: 10 mg via ORAL
  Filled 2014-08-10: qty 1

## 2014-08-10 MED ORDER — HYDROCORTISONE 10 MG PO TABS
10.0000 mg | ORAL_TABLET | Freq: Once | ORAL | Status: AC
  Administered 2014-08-10: 10 mg via ORAL
  Filled 2014-08-10: qty 1

## 2014-08-10 NOTE — Progress Notes (Signed)
Subjective:  Pt seen and examined in AM. No acute events overnight. She reports her abdominal cramping has improved. She has occasional lightheadedness with standing. She denies chest pain or palpitations. Her tachycardia has resolved with lopressor. She reports that brother has her house keys and is out of town until tomorrow morning.     Objective: Vital signs in last 24 hours: Filed Vitals:   08/09/14 0557 08/09/14 1342 08/09/14 2109 08/10/14 0522  BP: 156/86 148/82 136/81 130/83  Pulse: 124 114 95 98  Temp: 97.8 F (36.6 C) 98.1 F (36.7 C) 98 F (36.7 C) 97.7 F (36.5 C)  TempSrc: Oral Oral Oral Oral  Resp: 20 18 19 20   Height:      Weight: 169 lb 9.6 oz (76.93 kg)   174 lb (78.926 kg)  SpO2: 98% 97% 95% 97%   Weight change: 4 lb 6.4 oz (1.996 kg) No intake or output data in the 24 hours ending 08/10/14 1350 Physical Exam General: Sitting up in bed, pleasant, NAD HEENT: [place]/AT, EOMI, mucus membranes moist CV: Normal rate and rhythm, no m/g/r Pulm: CTA bilaterally, breaths non-labored Abd: BS+, distended, mild diffuse tenderness Ext: Warm, no edema Neuro: Alert and oriented x 3, no focal deficits  Lab Results: Basic Metabolic Panel:  Recent Labs Lab 08/05/14 0117  08/09/14 0444 08/10/14 0353  NA 135  < > 137 139  K 3.9  < > 3.4* 3.9  CL 102  < > 103 104  CO2 28  < > 19 21  GLUCOSE 172*  < > 123* 106*  BUN 10  < > 7 7  CREATININE 0.70  < > 0.59 0.58  CALCIUM 8.7  < > 8.5 8.5  MG 2.0  --   --   --   PHOS 3.6  --   --   --   < > = values in this interval not displayed. Liver Function Tests:  Recent Labs Lab 08/04/14 1450  AST 19  ALT 35  ALKPHOS 96  BILITOT 0.6  PROT 7.7  ALBUMIN 3.5    Recent Labs Lab 08/04/14 1450 08/05/14 0117  LIPASE 74*  --   AMYLASE  --  145*   CBC:  Recent Labs Lab 08/04/14 1450 08/05/14 0117 08/08/14 1120 08/09/14 0444  WBC 10.1 9.2 10.9* 9.9  NEUTROABS 7.2 6.4  --   --   HGB 14.1 12.5 12.6 12.4  HCT 43.1  38.7 38.9 38.7  MCV 80.1 79.5 81.4 80.8  PLT 223 216 232 234   Cardiac Enzymes:  Recent Labs Lab 08/04/14 2056 08/05/14 0117  CKTOTAL  --  75  CKMB  --  1.6  TROPONINI <0.03  --    Urinalysis:  Recent Labs Lab 08/04/14 1510 08/04/14 2054  COLORURINE YELLOW YELLOW  LABSPEC 1.015 <1.005*  PHURINE 6.0 6.5  GLUCOSEU NEG NEGATIVE  HGBUR NEG NEGATIVE  BILIRUBINUR Small  SMALL* NEGATIVE  KETONESUR TRACE* NEGATIVE  PROTEINUR 100  30* NEGATIVE  UROBILINOGEN 1  1.0 0.2  NITRITE Negative  NEG NEGATIVE  LEUKOCYTESUR NEG  Negative NEGATIVE   Medications: I have reviewed the patient's current medications.  Assessment/Plan:  Sinus Tachycardia:  Resolved. HR 98. She has had history of tachycardia at past clinic visits.  - Continue metoprolol 12.5mg  BID - Telemetry monitoring  Hypertension - Currently normotensive -Continue amlodipine 5 mg daily and metoprolol 12.5 mg BID  Abdominal Cramping:  Improved. Etiology most likely due to secondary AI (see below). 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 Her AM cortisol was low at 1.0 and then after cosyntropin challenge her cortisol remained below 20 at 8.3 after 30 mins and 12.0 after 60 mins. ACTH low. Likely secondary AI due to exogenous corticosteroid use. MRI Head with and without contrast with 3.53mm delayed enhancement concerning for microadenoma, but unlikely to be cause per Dr. Buddy Duty. She will have follow up with Dr. Buddy Duty on 09/05/14 to adjust her hydrocortisone as necessary.  -Endocrinology consulted, appreciate recommendations -Transition today to hydrocortisone 10 mg in AM and 5 mg in PM  -Pt has follow-up with Dr. Buddy Duty on 09/05/14  HIV: Had been well-controlled on HAART, then she was taken off of HAART due to an allergic reaction  (rash) in 2015 while she was also getting hepatitis C therapy. Resumed HARRT therapy in 04/2014 (Prezista, Norvir, Truvada), now with CD4 400, VL <110 on 05/2014. She is followed by Dr. Johnnye Sima. 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 Treated at [facility] with Simeprevir and Sofosbuvir 01/2013, then undetectable 06/2013.  Chronic Low Back Pain: Has lumbar spondylosis without myelopathy s/p lumbar branch blocks and s/p bilateral radiofrequency ablation for arthritis and bulging disk.  - Continue Tramadol 50 mg Q6H PRN - Continue Naproxen 500 mg BID PRN  Allergic Rhinitis: On albuterol and flonase at home. Stopped flonase. - Continue home medications - Continue Singulair 10 mg daily at bedtime  - Discharge with beclomethasone 1 spray BID  Diet: Regular  VTE PPx: Lovenox SQ  Dispo:  1 day (pt does not have house keys until tomm AM)  The patient does have a current PCP Cresenciano Genre, MD) and does need an [facility] hospital follow-up appointment after discharge.  The patient does not have transportation limitations that hinder transportation to clinic appointments.  .Services Needed at time of discharge: Y = Yes, Blank = No PT:   OT:   RN:   Equipment:   Other:     LOS: 5 days   Juluis Mire, MD 08/10/2014, 1:50 PM

## 2014-08-10 NOTE — Plan of Care (Signed)
Problem: Phase I Progression Outcomes Goal: Pain controlled with appropriate interventions Outcome: Completed/Met Date Met:  08/10/14 Does not have frequent pain.

## 2014-08-10 NOTE — Plan of Care (Signed)
Problem: Phase I Progression Outcomes Goal: OOB as tolerated unless otherwise ordered Outcome: Progressing Sat in recliner for about 1 1/2 hours today.

## 2014-08-11 ENCOUNTER — Telehealth: Payer: Self-pay | Admitting: Internal Medicine

## 2014-08-11 DIAGNOSIS — B370 Candidal stomatitis: Secondary | ICD-10-CM | POA: Diagnosis present

## 2014-08-11 DIAGNOSIS — E2749 Other adrenocortical insufficiency: Principal | ICD-10-CM

## 2014-08-11 MED ORDER — FLUCONAZOLE 100 MG PO TABS: 100.0000 mg | ORAL_TABLET | Freq: Every day | ORAL | Status: DC

## 2014-08-11 MED ORDER — FLUCONAZOLE 100 MG PO TABS: 100.0000 mg | ORAL_TABLET | Freq: Every day | ORAL | Status: AC

## 2014-08-11 MED ORDER — FLUCONAZOLE 100 MG PO TABS
200.0000 mg | ORAL_TABLET | Freq: Once | ORAL | Status: AC
  Administered 2014-08-11: 200 mg via ORAL
  Filled 2014-08-11: qty 2

## 2014-08-11 MED ORDER — HYDROCORTISONE 5 MG PO TABS: ORAL_TABLET | ORAL | Status: DC

## 2014-08-11 NOTE — Telephone Encounter (Signed)
Call to patient to confirm appointment for 08/12/14 at 2:15. [email or web address]

## 2014-08-11 NOTE — Progress Notes (Signed)
Subjective: No acute events overnight. She feels better overall. Her abdominal cramping continues to improve She denies chest pain.      Objective: Vital signs in last 24 hours: Filed Vitals:   08/10/14 0522 08/10/14 1439 08/10/14 2128 08/11/14 0524  BP: 130/83 131/75 147/90 142/89  Pulse: 98 96 97 97  Temp: 97.7 F (36.5 C) 97.6 F (36.4 C) 98.4 F (36.9 C) 98 F (36.7 C)  TempSrc: Oral Oral Oral Oral  Resp: 20 18 18 18   Height:      Weight: 174 lb (78.926 kg)   176 lb 1.6 oz (79.878 kg)  SpO2: 97% 95% 97% 95%   Weight change: 2 lb 1.6 oz (0.953 kg)  Intake/Output Summary (Last 24 hours) at 08/11/14 0920 Last data filed at 08/11/14 0600  Gross per 24 hour  Intake   1106 ml  Output      0 ml  Net   1106 ml   Physical Exam General: Sitting up in bed, pleasant, NAD HEENT: [place]/AT, EOMI, mucus membranes moist. Some white spots and erythema in throat. CV: Normal rate and rhythm, no m/g/r Pulm: CTA bilaterally, breaths non-labored Abd: BS+, mildly distended, nontender Ext: Warm, no edema Neuro: Alert and oriented x 3, no focal deficits  Lab Results: Basic Metabolic Panel:  Recent Labs Lab 08/05/14 0117  08/09/14 0444 08/10/14 0353  NA 135  < > 137 139  K 3.9  < > 3.4* 3.9  CL 102  < > 103 104  CO2 28  < > 19 21  GLUCOSE 172*  < > 123* 106*  BUN 10  < > 7 7  CREATININE 0.70  < > 0.59 0.58  CALCIUM 8.7  < > 8.5 8.5  MG 2.0  --   --   --   PHOS 3.6  --   --   --   < > = values in this interval not displayed. Liver Function Tests:  Recent Labs Lab 08/04/14 1450  AST 19  ALT 35  ALKPHOS 96  BILITOT 0.6  PROT 7.7  ALBUMIN 3.5    Recent Labs Lab 08/04/14 1450 08/05/14 0117  LIPASE 74*  --   AMYLASE  --  145*   CBC:  Recent Labs Lab 08/04/14 1450 08/05/14 0117 08/08/14 1120 08/09/14 0444  WBC 10.1 9.2 10.9* 9.9  NEUTROABS 7.2 6.4  --   --   HGB 14.1 12.5 12.6 12.4  HCT 43.1 38.7 38.9 38.7  MCV 80.1 79.5 81.4 80.8  PLT 223 216 232 234    Cardiac Enzymes:  Recent Labs Lab 08/04/14 2056 08/05/14 0117  CKTOTAL  --  75  CKMB  --  1.6  TROPONINI <0.03  --    Urinalysis:  Recent Labs Lab 08/04/14 1510 08/04/14 2054  COLORURINE YELLOW YELLOW  LABSPEC 1.015 <1.005*  PHURINE 6.0 6.5  GLUCOSEU NEG NEGATIVE  HGBUR NEG NEGATIVE  BILIRUBINUR Small  SMALL* NEGATIVE  KETONESUR TRACE* NEGATIVE  PROTEINUR 100  30* NEGATIVE  UROBILINOGEN 1  1.0 0.2  NITRITE Negative  NEG NEGATIVE  LEUKOCYTESUR NEG  Negative NEGATIVE   Medications: I have reviewed the patient's current medications.  Assessment/Plan:  Sinus Tachycardia:  Resolved. She has had history of tachycardia at past clinic visits.  - Continue metoprolol 12.5mg  BID - Telemetry monitoring  Hypertension: Currently normotensive -Continue amlodipine 5 mg daily and metoprolol 12.5 mg BID  Abdominal Cramping:  Improved. Etiology most likely due to secondary AI (see below). 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 Her AM cortisol was low at 1.0 and then after cosyntropin challenge her cortisol remained below 20 at 8.3 after 30 mins and 12.0 after 60 mins. ACTH low. Likely secondary AI due to exogenous corticosteroid use. MRI Head with and without contrast with 3.72mm delayed enhancement concerning for microadenoma, but unlikely to be cause per Dr. Buddy Duty. She will have follow up with Dr. Buddy Duty on 09/05/14 to adjust her hydrocortisone as necessary.  -Endocrinology consulted, appreciate recommendations -Continue hydrocortisone 10 mg in AM and 5 mg in PM  -Pt has follow-up with Dr. Buddy Duty on 09/05/14 -Fluconazole 200mg  x1 today for oropharyngeal candidiasis. Will treat with 100mg  daily for 14 day course.  HIV: Had been well-controlled on HAART, then she was taken off of HAART due  to an allergic reaction (rash) in 2015 while she was also getting hepatitis C therapy. Resumed HARRT therapy in 04/2014 (Prezista, Norvir, Truvada), now with CD4 400, VL <110 on 05/2014. She is followed by Dr. Johnnye Sima. 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 Treated at [facility] with Simeprevir and Sofosbuvir 01/2013, then undetectable 06/2013.  Chronic Low Back Pain: Has lumbar spondylosis without myelopathy s/p lumbar branch blocks and s/p bilateral radiofrequency ablation for arthritis and bulging disk.  - Continue Tramadol 50 mg Q6H PRN - Continue Naproxen 500 mg BID PRN  Allergic Rhinitis: On albuterol and flonase at home. Stopped flonase. - Continue home medications - Continue Singulair 10 mg daily at bedtime   Diet: Regular  VTE PPx: Lovenox SQ  Dispo:  Likely today.  The patient does have a current PCP Cresenciano Genre, MD) and does need an [facility] hospital follow-up appointment after discharge.  The patient does not have transportation limitations that hinder transportation to clinic appointments.  .Services Needed at time of discharge: Y = Yes, Blank = No PT:   OT:   RN:   Equipment:   Other:     LOS: 6 days   Milagros Loll, MD 08/11/2014, 9:20 AM

## 2014-08-12 ENCOUNTER — Ambulatory Visit: Payer: Medicaid Other | Admitting: Internal Medicine

## 2014-08-12 ENCOUNTER — Other Ambulatory Visit: Payer: Self-pay | Admitting: Infectious Diseases

## 2014-08-12 ENCOUNTER — Other Ambulatory Visit: Payer: Self-pay | Admitting: Internal Medicine

## 2014-08-12 ENCOUNTER — Other Ambulatory Visit: Payer: Self-pay | Admitting: Pulmonary Disease

## 2014-08-12 NOTE — Progress Notes (Signed)
[name], female   DOB: Jan 12, 1961, 54 y.o.   MRN: [id number] Medicine attending discharge note: I personally interviewed and examined this patient on the day of discharge and I attest to the accuracy of the evaluation and plan as recorded by resident physician Dr. Audria Nine.  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 On initial exam by Dr. Drucilla Schmidt, patient was in no distress. Vital signs were normal. Blood pressure if anything was on the high side at 153/87. She was tachycardic with pulse rate 98-109. Afebrile 98.2. Respirations 17 with oxygen saturation 100% on room air.. Regular rhythm. No murmurs. Clear lungs. Abdomen was soft, diffusely tender to palpation but no rebound tenderness, no Murphy sign, bowel sounds present. Musculoskeletal exam normal.  She was mildly hypokalemic with potassium 3.2. Serum lipase mildly elevated at 74. CK enzyme mildly elevated at 183 back in February was normal at time of this admission at 26.  Hospital course: Her symptoms and physical findings were not specific. She was treated with conservative treatment with hydration. A CT scan of the abdomen and pelvis showed no acute abnormalities. Colonic diverticulosis without diverticulitis. One might postulate that her GI complaints were related to mild pancreatitis which in turn might be related to her HIV medication. However symptoms resolved spontaneously. She does have a history of GERD.  With respect to the leg cramping, arterial Doppler studies were done and showed normal ABI ratios with no evidence for claudication. As noted above, CK level was normal.  Her voice sounded hoarse and she  admitted this was an acute finding for her. Exam on April 18 remarkable for a Candida type exudate in the posterior pharynx. Oral Diflucan was initiated.  Her presenting symptoms resolved spontaneously. She was discharged in stable condition. There were no complications.  I find her diagnosis of secondary adrenal insufficiency intriguing. She was taking chronic nasal steroids for her allergy. She had a number of doses of injectable long-acting steroids given last year for arthritis problems. One of her HIV medications can potentiate steroid levels in the blood but in my opinion, the only hypothesis I could generate to explain why she would develop secondary adrenal insufficiency would be that the HIV drug artificially elevated her steroid level and then she stopped the nasal steroids abruptly. I would be very interested in her endocrinologist's opinion.

## 2014-08-12 NOTE — Discharge Summary (Signed)
Name: [name] [name] MRN: [id number] DOB: May 02, 1960 54 y.o. PCP: Cresenciano Genre, MD  Date of Admission: 08/04/2014  7:33 PM Date of Discharge: 08/11/2014 Attending Physician: Murriel Hopper, MD  Discharge Diagnosis: Principal Problem:   Secondary adrenal insufficiency Active Problems:   Abdominal pain   Sinus Tachycardia   Thrush   HTN (hypertension)   HIV disease   Chronic low back pain   Allergic rhinitis  Discharge Medications:   Medication List    TAKE these medications        amLODipine 5 MG tablet  Commonly known as:  NORVASC  Take 1 tablet (5 mg total) by mouth daily.     baclofen 10 MG tablet  Commonly known as:  LIORESAL  Take 1 tablet (10 mg total) by mouth 3 (three) times daily.     cetirizine 10 MG tablet  Commonly known as:  ZYRTEC  TAKE 1 TABLET BY MOUTH ONCE DAILY AS NEEDED FOR ALLERGIES     diazepam 5 MG tablet  Commonly known as:  VALIUM  Take 1 tablet (5 mg total) by mouth at bedtime as needed.     DIPHENHIST 25 MG tablet  Generic drug:  diphenhydrAMINE  TAKE 1-2 TABLETS BY MOUTH EVERY NIGHT AT BEDTIME AS NEEDED FOR ITCHING     emtricitabine-tenofovir 200-300 MG per tablet  Commonly known as:  TRUVADA  Take 1 tablet by mouth daily.     fluconazole 100 MG tablet  Commonly known as:  DIFLUCAN  Take 1 tablet (100 mg total) by mouth daily. Start on 08/12/2014     gabapentin 600 MG tablet  Commonly known as:  NEURONTIN  Take 1 tablet (600 mg total) by mouth 3 (three) times daily.     hydrocortisone 10 MG tablet  Commonly known as:  CORTEF  Take 1 tablet every morning at 8AM     hydrocortisone 5 MG tablet  Commonly known as:  CORTEF  Take 1 tablet every afternoon at 4PM.     hydrOXYzine 25 MG tablet  Commonly known as:  ATARAX/VISTARIL  TAKE 1 TABLET BY MOUTH EVERY 8 HOURS AS NEEDED FOR ITCHING     methocarbamol 500 MG tablet  Commonly known as:  ROBAXIN  TAKE 1 TABLET BY MOUTH EVERY 8 HOURS AS NEEDED FOR MUSCLE SPASMS     metoprolol tartrate 25 MG tablet  Commonly known as:  LOPRESSOR  Take 0.5 tablets (12.5 mg total) by mouth 2 (two) times daily.     montelukast 10 MG tablet  Commonly known as:  SINGULAIR  TAKE 1 TABLET BY MOUTH EVERY DAY     naproxen 500 MG tablet  Commonly known as:  NAPROSYN  Take 1 tablet (500 mg total) by mouth 2 (two) times daily as needed for moderate pain (Take with food).     NORVIR 100 MG Tabs tablet  Generic drug:  ritonavir  TAKE 1 TABLET BY MOUTH EVERY DAY WITH BREAKFAST     pantoprazole 20 MG tablet  Commonly known as:  PROTONIX  Take 1 tablet (20 mg total) by mouth 2 (two) times daily.     PATADAY 0.2 % Soln  Generic drug:  Olopatadine HCl  Apply 1 drop to eye daily as needed. For allergies     Potassium Chloride 40 MEQ/15ML (20%) Soln  TAKE 15 ML BY MOUTH EVERY MORNING     PREZISTA 800 MG tablet  Generic drug:  Darunavir Ethanolate  TAKE 1 TABLET BY MOUTH EVERY DAY WITH BREAKFAST  PROAIR HFA 108 (90 BASE) MCG/ACT inhaler  Generic drug:  albuterol  INHALE 1-2 PUFFS BY MOUTH INTO THE LUNGS EVERY 6 HOURS AS NEEDED     promethazine 50 MG tablet  Commonly known as:  PHENERGAN  TAKE 1/2 TABLET BY MOUTH EVERY 6 HOURS AS NEEDED FOR NAUSEA OR VOMITING     traMADol 50 MG tablet  Commonly known as:  ULTRAM  TAKE 2 TABLETS BY MOUTH EVERY 4-6 HOURS AS NEEDED FOR PAIN ; MUST LAST 30 DAYS     Vitamin D3 50000 UNITS Tabs  Take 1 tablet by mouth once a week.        Disposition and follow-up:   [name] was discharged from [facility] in Stable condition.  At the hospital follow up visit please address:  1. Secondary adrenal insufficiency: She is on hydrocortisone 10 mg in AM and 5 mg in PM (started on 08/10/14). She will follow up with Dr. Buddy Duty on 09/05/2014. She will need her potassium rechecked. Sinus Tachycardia and Hypertension: She was decreased on her Norvasc to 19m daily and started on metoprolol 12.579mBID. May need to titrate her  metoprolol for better BP control. Thrush: She is to complete a 14 day course with fluconazole 10024maily. Allergic rhinitis: Her Flonase was discontinued as it can lead to secondary adrenal insufficiency in the setting of protease inhibitor use.  2.  Labs / imaging needed at time of follow-up: BMP  3.  Pending labs/ test needing follow-up: None  Follow-up Appointments:     Follow-up Information    Follow up with McLCresenciano GenreD On 08/18/2014.   Specialty:  Internal Medicine   Why:  2:45PM for follow up   Contact information:   [address] [phone]    Follow up with KERR,JEFFREY, MD On 09/05/2014.   Specialty:  Endocrinology   Why:  12:00PM for follow up   Contact information:   [address] [place] [phone]    Discharge Instructions: Discharge Instructions    Call MD for:  difficulty breathing, headache or visual disturbances    Complete by:  As directed      Call MD for:  persistant dizziness or light-headedness    Complete by:  As directed      Call MD for:  persistant nausea and vomiting    Complete by:  As directed      Call MD for:  severe uncontrolled pain    Complete by:  As directed      Call MD for:  temperature >100.4    Complete by:  As directed      Diet - low sodium heart healthy    Complete by:  As directed      Increase activity slowly    Complete by:  As directed            Consultations: Treatment Team:  JefDelrae RendD  Procedures Performed:  Mr BraJeri Cos Contrast  08/08/2014   CLINICAL DATA:  Adrenal insufficiency.  EXAM: MRI HEAD WITHOUT AND WITH CONTRAST  TECHNIQUE: Multiplanar, multiecho pulse sequences of the brain and surrounding structures were obtained without and with intravenous contrast.  CONTRAST:  25m6mLTIHANCE GADOBENATE DIMEGLUMINE 529 MG/ML IV SOLN  COMPARISON:  None available.  FINDINGS: Moderate periventricular and scattered subcortical T2  hyperintensities are evident bilaterally. Ventricles are of normal size.  No acute infarct, hemorrhage,  or mass lesion is present.  The ventricles are of normal size.  No significant extraaxial fluid collection is present.  Flow is present in the major intracranial arteries. The globes and orbits are intact. Moderate mucosal thickening is present in the left maxillary sinus. A T2 hyperintense lesion within the right-sided nasal cavity likely represents a polyp. There is a polyp or mucous retention cyst in the right maxillary sinus is well. Scattered mucosal thickening is present in the anterior ethmoid air cells and inferior frontal sinuses. The sphenoid sinuses are clear. Mastoid air cells are clear. Prominent adenoid tissue is evident.  Dedicated imaging of the pituitary gland demonstrates a grossly normal overall size morphology of the gland.  The pituitary stalk is midline an area of hypoattenuation is seen posteriorly and on the left measuring 3.5 x 3.5 mm. There is no invasion of the cavernous sinus. The optic chiasm is within normal limits.  Postcontrast imaging through the remainder the brain is unremarkable.  IMPRESSION: 1. 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 2. Age advanced periventricular and subcortical T2 hyperintensities bilaterally. The finding is nonspecific but can be seen in the setting of chronic microvascular ischemia, a demyelinating process such as multiple sclerosis, vasculitis, complicated migraine headaches, or as the sequelae of a prior infectious or inflammatory process. 3. No acute intracranial abnormality.   Electronically Signed   By: San Morelle M.D.   On: 08/08/2014 07:25   Ct Abdomen Pelvis W Contrast  08/04/2014   CLINICAL DATA:  Right upper quadrant and right lower quadrant cramping abdominal pain. Symptoms over the last few days, intensifying. Nausea. History of diverticulitis  in pancreatitis.  EXAM: CT ABDOMEN AND PELVIS WITH CONTRAST  TECHNIQUE: Multidetector CT imaging of the abdomen and pelvis was performed using the standard protocol following bolus administration of intravenous contrast.  CONTRAST:  156m OMNIPAQUE IOHEXOL 300 MG/ML  SOLN  COMPARISON:  Remote CT 04/28/2005  FINDINGS: The included lung bases are clear.  There is a 9 mm cyst in the left lobe of the liver. No enhancing hepatic lesion. Gallbladder is physiologically distended, no calcified gallstones. No biliary dilatation. The spleen, adrenal glands, and pancreas are normal. No pancreatic ductal dilatation or surrounding inflammatory change. The previous small hypodense lesion in the spleen is no longer present. There is an 8 mm hypodensity in the anterior right kidney, unchanged from prior exam, too small to characterize. Tiny cortical hypodensity in the mid left kidney, unchanged. There is symmetric renal enhancement and excretion.  The stomach is physiologically distended. There are no dilated or thickened bowel loops. The appendix is normal. Small volume of stool throughout the colon, no colonic wall thickening. There is a prominent sigmoid colonic diverticulum without associated inflammatory change to suggest diverticulitis, this is unchanged from prior exam. No free air, free fluid, or intra-abdominal fluid collection.  Abdominal aorta is normal in caliber with mild moderate atherosclerosis. No retroperitoneal adenopathy.  Within the pelvis the urinary bladder is physiologically distended. The uterus is normal in size. There is no adnexal mass. No pelvic free fluid. No pelvic adenopathy.  There are no acute or suspicious osseous abnormalities.  IMPRESSION: 1. No acute abnormality in the abdomen/pelvis. 2. Colonic diverticula without diverticulitis. 3. Unchanged small subcentimeter hypodensities in both kidneys, too small to characterize but likely cysts. Small hepatic cyst.   Electronically Signed   By: MJeb LeveringM.D.   On: 08/04/2014 18:54  Admission HPI: Ms. Saloma Cadena is a 54 yo woman with a history of adrenal insufficiency, HIV, hepatitis C and hypertension who presented from clinic with lower extremity and epigastric cramping. The lower extremity cramping has been ongoing for about two months. However, over the past 3 days, she also had epigastric cramping that worsened in intensity and became more frequent. The pain is worst at night and when she is at rest. She has tried robaxin, tramadol, naproxen, ice and valium; nothing really helps the pain, but the valium allows her to fall asleep. Her symptoms are associated with some nausea but no vomiting. She has had no changes in her bowel movements. Her appetite is decreased, but she does admit to eating cookies and peanut butter crackers today.  Of note, she was found to have adrenal insufficiency on a recent clinic visit with a low am cortisol (1). A determination of whether her adrenal insufficiency is primary or secondary is ongoing, and the patient has an endocrine appointment in early May. Her dexamethasone was recently switched to hydrocortisone due to lower extremity edema; she last took hydrocortisone this morning.  Hospital Course by problem list: 1. Secondary adrenal insufficiency: She did not have weight loss, hyperpigmentation, eosinophilia, hyponatremia, hypoglycemia, hypotension, anemia, diarrhea, or some of the other manifestations possible with adrenal insufficiency. She was admitted for further workup. She was seen in consultation by endocrinology, Dr. Buddy Duty. On 4/13 at 5 AM, ACTH, cortisol, and renin-aldosterone levels were checked. She was given cosyntropin 0.25 mg IV. And her cortisol 60 mins after cosyntropin administration was checked. 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 Her AM cortisol was low at 1.0 and then after cosyntropin challenge her cortisol remained below 20 at 8.3 after 30 mins and 12.0 after 60 mins. Her  ACTH level was low making secondary adrenal insufficiency likely. MRI Head with and without contrast with 3.32m delayed enhancement concerning for microadenoma, but unlikely to be cause per Dr. KBuddy Duty She was placed on Hydrocortisone 30 mg at 8 AM and 10 mg at 4 PM for 3 days and transitioned to Hydrocortisone 10 mg in AM and 5 mg in PM (started on 08/10/14). She was also given a dose of 59mIV hydrocortisone as a single dose on 08/08/2014. Per Dr. KeBuddy Duty"Ms. Fetterman's primary care physician, Dr. McAundra Dubindiscovered that the patient's physical medicine rehabilitation physician provided multiple corticosteroid doses within the past year. Available records show administration of 4 mg of dexamethasone on 10/17/2013, 12/12/2013, and 01/21/2014. Note that 4 mg of dexamethasone is comparable to 107 mg of hydrocortisone. Likely the patient's central adrenal insufficiency is from corticosteroid therapy." She will follow up with Dr. KeBuddy Dutyn 09/05/2014.  2. Abdominal pain: It was determined that her abdominal pain is likely related to adrenal insufficiency which can cause nausea, vomiting, dyspepsia, gastroenteritis. Her UA was negative and troponin was negative. She reported that K pads and valium help the cramps. CT Abdomen 4/11 did not show any acute abnormalities. She has had extensive work up as an outpatient with her PCP, including checking vitamin D (low 9), CPK (normal), TSH (normal), ESR and CRP (normal), liver enzymes (normal), aldolase (normal), and AM cortisol (low at 1). Her adrenal insufficiency was worked up as described in previous problem. She was started on Flexeril 13m100mID on 4/13. This was ineffective so she was switched to Baclofen 5m22mD. She was placed on Hydrocortisone 30 mg at 8 AM and 10 mg at 4 PM for 3 days and transitioned to Hydrocortisone  10 mg in AM and 5 mg in PM (started on 08/10/14) for her adrenal insufficiency. She reported that her symptoms improved with this regimen.  3. Sinus  Tachycardia: her heart rate was in the 100s to 120s during the hospitalization. Her heart rate increased with standing so she was started on normal saline. This failed to improve her sinus tachycardia. Upon review of EMR, she was found to be tachycardic in past encounters. Her EKG demonstrated sinus tachycardia. She was decreased on her Norvasc to 42m daily and started on metoprolol 12.515mBID.  4. Thrush: She was found to have white lesions in her throat following initiation of her hydrocortisone regimen. She also noted she was hoarse. She was given Fluconazole 20050m1 08/11/2014 for oropharyngeal candidiasis. And she will complete a 14 day course with fluconazole 100m21mily.  5. HTN (hypertension): On Norvasc 10 mg daily at home. On admission she had a BP of 142/82. She was continued on her home Norvasc 10mg45mly. Her medication was adjusted as per problem 3.  6. HIV disease: She had been well-controlled on HAART, then she was taken off of HAART due to an allergic reaction (rash) in 2015 while she was also getting hepatitis C therapy. Resumed HARRT therapy in 04/2014 (Prezista, Norvir, Truvada), now with CD4 400, VL <110 on 05/2014 (followed by Dr. HatchJohnnye Simas intermittent nausea/vomiting on her HAART regimen. She was continued on phenergan prn nausea and continued on her home regimen of Prezista, Norvir, Truvada.  7. 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 She was continued on Tramadol 50 mg Q6H PRN and Naproxen 500 mg BID PRN  8. Allergic rhinitis: She is on albuterol and Flonase at home. She was continued on her home albuterol. Her Flonase was discontinued as it can lead to secondary adrenal insufficiency in the setting of protease inhibitor use. She was also continued on Singulair 10mg 1my at bedtime. May consider beclomethasone 1 spray BID at outpatient follow up per  pharmacy.  9. Hypokalemia: Potassium was 3.2 on admission. She was started on potassium chloride 40meq 60maily. Her potassium on discharge was 3.9.    Discharge Vitals:   BP 142/89 mmHg  Pulse 97  Temp(Src) 98 F (36.7 C) (Oral)  Resp 18  Ht _0  (1.549 m)  Wt 176 lb 1.6 oz (79.878 kg)  BMI 33.29 kg/m2  SpO2 95%  Discharge Labs:  No results found for this or any previous visit (from the past 24 hour(s)).  Signed: JennifeMilagros Loll19/2016, 1:23 PM    Services Ordered on Discharge: None Equipment Ordered on Discharge: None

## 2014-08-16 LAB — HM DIABETES EYE EXAM

## 2014-08-18 ENCOUNTER — Encounter: Payer: Self-pay | Admitting: Internal Medicine

## 2014-08-18 ENCOUNTER — Ambulatory Visit (INDEPENDENT_AMBULATORY_CARE_PROVIDER_SITE_OTHER): Payer: Medicaid Other | Admitting: Internal Medicine

## 2014-08-18 ENCOUNTER — Ambulatory Visit (HOSPITAL_COMMUNITY)
Admission: RE | Admit: 2014-08-18 | Discharge: 2014-08-18 | Disposition: A | Discharge status: Home / Self Care | Payer: Medicaid Other | Source: Ambulatory Visit | Attending: Internal Medicine | Admitting: Internal Medicine

## 2014-08-18 VITALS — BP 140/90 | HR 97 | Temp 98.3°F | Ht 61.0 in | Wt 166.0 lb

## 2014-08-18 DIAGNOSIS — D352 Benign neoplasm of pituitary gland: Secondary | ICD-10-CM

## 2014-08-18 DIAGNOSIS — H538 Other visual disturbances: Secondary | ICD-10-CM

## 2014-08-18 DIAGNOSIS — E2749 Other adrenocortical insufficiency: Secondary | ICD-10-CM

## 2014-08-18 DIAGNOSIS — R0789 Other chest pain: Secondary | ICD-10-CM

## 2014-08-18 DIAGNOSIS — I10 Essential (primary) hypertension: Secondary | ICD-10-CM | POA: Insufficient documentation

## 2014-08-18 DIAGNOSIS — Z87891 Personal history of nicotine dependence: Secondary | ICD-10-CM | POA: Insufficient documentation

## 2014-08-18 DIAGNOSIS — E559 Vitamin D deficiency, unspecified: Secondary | ICD-10-CM

## 2014-08-18 DIAGNOSIS — B379 Candidiasis, unspecified: Secondary | ICD-10-CM

## 2014-08-18 DIAGNOSIS — R252 Cramp and spasm: Secondary | ICD-10-CM

## 2014-08-18 LAB — BASIC METABOLIC PANEL
ANION GAP: 10 (ref 5–15)
BUN: 11 mg/dL (ref 6–23)
CHLORIDE: 105 mmol/L (ref 96–112)
CO2: 25 mmol/L (ref 19–32)
Calcium: 9.5 mg/dL (ref 8.4–10.5)
Creatinine, Ser: 0.76 mg/dL (ref 0.50–1.10)
GFR calc non Af Amer: >90 mL/min (ref >90)
Glucose, Bld: 132 mg/dL — ABNORMAL HIGH (ref 70–99)
Potassium: 3.8 mmol/L (ref 3.5–5.1)
Sodium: 140 mmol/L (ref 135–145)

## 2014-08-18 LAB — TROPONIN I: Troponin I: <0.03 ng/mL (ref <0.031)

## 2014-08-18 NOTE — Progress Notes (Signed)
Subjective:    [name], female    DOB: 10/29/1960, 54 y.o.   MRN: [id number]  HPI Comments: 54 y.o with well controlled HIV cd4 400 VL <110 as of 05/2014(followed by Dr. 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 Letta Pate), postherpetic neuralgia, type 1 RTA, chronic lower extremity edema, venous insufficiency.  She presents for HFU 1. She was admitted for secondary (2/2 steroids in back injections from PM&R) adrenal insufficiency which she was having severe muscle cramps from lower legs to abdomen.  She was getting cramps every 15-20 minutes. Since she has been on HC 10 mg qam and 5 mg qpm she is doing better and only getting cramps once every 2 days and not as severe. She is tolerating HC better than dexamethasone with less leg swelling.  When she does get cramps they happen at night at rest. She did not get much relief with Valium but did get relief with Baclofen. 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 Levada Dy on 09/05/14.    2.  She c/o CP with exertion since she is walking more. CP is mid chest feels like a twitch or "arrhythmia" then stops. She denies assoc sob, nausea/vomiting.  CP is 6/10 at onset worse with exertion and better with rest   3. She c/o blurred vision since starting Metoprolol 12.5 mg bid that lasts a good part of the day.  She uses her progressive glasses and readers w/o relief.  She has seen Dr. Herbert Deaner in the past and  stated they may have seen something on her eyes. She denies any eye pain currently   4. HTN-BP 148/91 will repeat  5. Vit D def noted 06/26/14 to be 9 still taking 50K units qweek        Review of Systems  Eyes: Positive for visual disturbance.  Respiratory: Negative for shortness of breath.   Cardiovascular: Positive for chest pain and palpitations. Negative for leg swelling.  Gastrointestinal: Negative for nausea and vomiting.       Objective:   Physical Exam  Constitutional: She is oriented to person, place, and time. She appears well-developed and well-nourished. She is cooperative. No distress.  HENT:  Head: Normocephalic and atraumatic.  Eyes: Conjunctivae are normal. Pupils are equal, round, and reactive to light. Right eye exhibits no discharge. Left eye exhibits no discharge. No scleral icterus.  Cardiovascular: Normal rate, regular rhythm, S1 normal, S2 normal and normal heart sounds.   No murmur heard. 1+ to 2+ leg edema b/l  Pulmonary/Chest: Effort normal and breath sounds normal. No respiratory distress. She has no wheezes.  Abdominal: Soft. Bowel sounds are normal. There is no tenderness.  Neurological: She is alert and oriented to person, place, and time. Gait normal.  Skin: Skin is warm, dry and intact. No rash noted. She is not diaphoretic.  Psychiatric: She has a normal mood and affect. Her speech is normal and behavior is normal. Judgment and thought content normal. Cognition  and memory are normal.  Nursing note and vitals reviewed.         Assessment & Plan:  F/u in 3 months, check vit D 09/17/14

## 2014-08-18 NOTE — Assessment & Plan Note (Addendum)
Will repeat level 09/17/14 to decide dose  Continue 50 K units qweek for now

## 2014-08-18 NOTE — Assessment & Plan Note (Signed)
Resolved

## 2014-08-18 NOTE — Assessment & Plan Note (Signed)
BP Readings from Last 3 Encounters:  08/18/14 140/90  08/11/14 142/89  08/04/14 148/82    Lab Results  Component Value Date   NA 139 08/10/2014   K 3.9 08/10/2014   CREATININE 0.58 08/10/2014    Assessment: Blood pressure control: mildly elevated Progress toward BP goal:  deteriorated Comments: repeat BP controlled   Plan: Medications:  continue current medications Norvasc 5, Lopressor 12.5 mg qd Other plans: check BMET today, f/u in 3 months

## 2014-08-18 NOTE — Assessment & Plan Note (Signed)
Maybe side effect of Lopressor but will refer back to Dr. Herbert Deaner for further w/u.  Also pt with underlying pit. Microadenoma Referred to eye MD  If neg. Consider d/c Lopressor if unable to tolerate as one side effect is blurred vision

## 2014-08-18 NOTE — Assessment & Plan Note (Addendum)
Typical vs atypical CP.  Sounds typical b/c happens with exertion and is not reproducible on exam  EKG today (normal sinus, TWI AVR), trop stat Will refer to Dr. Johnsie Cancel for f/u  Also Dr. Johnsie Cancel please comment on need for 30 day holter monitor as patient maybe having palpitations.  Consider stress test

## 2014-08-18 NOTE — Assessment & Plan Note (Signed)
Muscle spasms/cramps were 2/2 AI now improving

## 2014-08-18 NOTE — Assessment & Plan Note (Signed)
Due to steroids from back inj. For chronic back pain  Sx's improved with HC 10 qam and 5 qpm  F/u with Dr. 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 Buddy Duty will address. Pt now with blurry vision

## 2014-08-18 NOTE — Patient Instructions (Addendum)
General Instructions: Please follow up in 3 months but come in 1 month to repeat a vitamin D level on 09/17/14 Take care  Its been a pleasure taking care of you We will send you to Dr. Johnsie Cancel and Dr. Herbert Deaner  If Dr. Herbert Deaner does not see anything wrong with your eyes we may have to stop the Metoprolol    Treatment Goals:  Goals (1 Years of Data) as of 08/18/14          As of Today 08/11/14 08/10/14 08/10/14 08/10/14     Blood Pressure   . Blood Pressure < 140/90  148/91 142/89 147/90 131/75 130/83      Progress Toward Treatment Goals:  Treatment Goal 08/18/2014  Blood pressure deteriorated    Self Care Goals & Plans:  Self Care Goal 08/18/2014  Manage my medications take my medicines as prescribed; bring my medications to every visit; refill my medications on time; follow the sick day instructions if I am sick  Monitor my health -  Eat healthy foods drink diet soda or water instead of juice or soda; eat more vegetables; eat foods that are low in salt; eat baked foods instead of fried foods; eat fruit for snacks and desserts; eat smaller portions  Be physically active find an activity I enjoy  Meeting treatment goals maintain the current self-care plan    No flowsheet data found.   Care Management & Community Referrals:  Referral 08/18/2014  Referrals made for care management support none needed  Referrals made to community resources none       Addison Disease Addison disease is a glandular (endocrine) or hormonal disorder. It is also called adrenal insufficiency. It affects about 1 in 100,000 people. It can affect men and women in all age groups. This disease occurs when the adrenal glands do not make enough of the hormone cortisol. In some cases, the adrenal glands also do not make the hormonealdosterone. Without the right levels of these hormones, your body cannot maintain critical life functions. The adrenal glands are located just above the kidneys. Cortisol is a steroid  hormone. Its most important job is to help the body respond to stress. Cortisol also helps the body to:  Maintain blood pressure and heart (cardiovascular) function.  Slow the immune system's response to inflammation.  Control the use of proteins, carbohydrates (sugars), and fats.  Maintain a sense of well-being. CAUSES  A lack of cortisol can happen for different reasons.   Primary adrenal insufficiency is Addison disease. The adrenal glands do not produce enough, or any, cortisol. Usually, aldosterone is also not produced.  Secondary adrenal insufficiency. The pituitary gland may not make enough of a hormone called ACTH (adrenocorticotropin). This hormone causes the adrenal glands to produce cortisol. Usually, there is enough aldosterone. Primary Adrenal Insufficiency can be caused by:  Autoimmune disease. Your body can produce antibodies that attack its own organs (in this case, your adrenal glands). The reasons why this happens are not well understood at this time. Sometimes, other organs are also affected (the polyglandular autoimmune syndromes).  An infection of the adrenal glands. Possible causes include tuberculosis, viruses (including HIV), and fungal infections. Secondary Adrenal Insufficiency This form is much more common than the primary form. It can be traced to a lack of ACTH. Without ACTH, the adrenal glands cannot make cortisol. Causes include:  Diseases of the pituitary gland. This is a small gland in the brain that controls many important body functions. The pituitary gland produces ACTH, among  other hormones. Pituitary gland tumors, injury, or surgery can cause inadequate ACTH production, which causes inadequate cortisol production.  Medications:  Megestrol (used for cancer treatment and to stimulate appetite) and some pain medications can impair production of cortisol.  Use of cortisol medication (steroids) causes your adrenal glands to not produce cortisol. When you  stop this medication, it can take time for the adrenal glands to start producing cortisol again. This can be a dangerous situation, requiring slowly reducing your cortisol medicine. SYMPTOMS  Symptoms of adrenal insufficiency normally begin slowly. Problems seen with the disease are:  Severe tiredness (fatigue).  Muscle weakness.  Loss of appetite.  Weight loss. About 50% of the time, the following symptoms occur:   Nausea, vomiting and diarrhea.  Drops in blood pressure.  Dizziness or fainting.  Darkening of the skin (with primary disease only).  Being easily angered (irritable).  Depression.  Salt craving.  Low blood sugar (hypoglycemia).  Irregular or no menstrual periods. The symptoms slowly get worse. They are often ignored until a stressful event like an illness or an accident occurs. This is called an Addisonian crisis, or acute adrenal insufficiency. Without treatment, an Addisonian crisis can cause death. Symptoms of an Addisonian crisis include:  Sudden, severe pain in the lower back, abdomen, or legs.  Severe vomiting and diarrhea.  Dehydration.  Low blood pressure.  Loss of consciousness. DIAGNOSIS  In its early stages, adrenal insufficiency can be hard to diagnose. Your caregiver will need to:  Review your medical history.  Review your symptoms, such as dark tanning of the skin.  Perform lab tests. Results will show if levels of cortisol are too low, and will help identify the cause. CT scan or MRI scan of the adrenal and pituitary glands may also be necessary. TREATMENT  With proper treatment, you can live a normal life with Addison disease or adrenal insufficiency.  Missing hormones need to be replaced in order to treat Addison disease. Cortisol is replaced with hydrocortisone tablets taken by mouth. Other forms of cortisol may also be used. Since cortisone levels normally are higher in the morning and lower in the evening, you may need different  doses at different times of the day. Be sure to follow your instructions carefully.  If your body cannot maintain the right levels of salt (sodium) and fluids because of too little aldosterone, you will also be given a medication. This drug replaces aldosterone. Important points about treatment:  Any sudden (acute) illness can increase your body's need for cortisol. Surgery, or other stress on the body, can do the same. If you have Addison disease and you are ill or having surgery, you will need an increase in hormone medication to prevent an Addisonian crisis. Untreated, an Addisonian crisis can cause death.  If you are too ill to take your medication or you cannot keep it down, you must take medicine through a shot (injection). You or someone who lives with you will need to learn how to give you this injection. The shot will take the place of hydrocortisone. If you find it necessary to give yourself injectable medication, call your caregiver right away, or go to the nearest hospital emergency room. HOME CARE INSTRUCTIONS   Always carry an identification card stating your condition in case of an emergency. The card should:  Alert emergency personnel about the need to inject 100 mg of cortisol if you are severely hurt or cannot respond.  Include your caregiver's name and phone number.  Include the  name and number of your closest relative to contact.  When traveling, carry a needle, syringe, and an injectable form of cortisol for emergencies.  Know how to increase medication during periods of stress or mild colds.  Wear a warning bracelet or neck chain to alert emergency personnel. Many companies sell medical ID products.  Take your medications as prescribed. Do not stop your medications without medical supervision.  Learn about your condition. Ask your caregiver for further resources. This is a potentially dangerous, but easily managed illness. SEEK MEDICAL CARE IF:   You have Addison  disease and you are in need of surgery.  You have Addison disease and you have an acute illness.  You have weakness, weight loss, or other unexplained symptoms. SEEK IMMEDIATE MEDICAL CARE IF:   You have symptoms of crisis:  Sudden, severe pain in the lower back, abdomen, or legs.  Severe vomiting and diarrhea.  Dehydration.  Low blood pressure.  Loss of consciousness.  You are experiencing severe:  Infections or other illness.  Vomiting.  Diarrhea. Document Released: 04/11/2005 Document Revised: 08/26/2013 Document Reviewed: 09/02/2008 [facility] Patient Information 2015 [place], [place]. This information is not intended to replace advice given to you by your health care provider. Make sure you discuss any questions you have with your health care provider.  Chest Pain (Nonspecific) It is often hard to give a specific diagnosis for the cause of chest pain. There is always a chance that your pain could be related to something serious, such as a heart attack or a blood clot in the lungs. You need to follow up with your health care provider for further evaluation. CAUSES   Heartburn.  Pneumonia or bronchitis.  Anxiety or stress.  Inflammation around your heart (pericarditis) or lung (pleuritis or pleurisy).  A blood clot in the lung.  A collapsed lung (pneumothorax). It can develop suddenly on its own (spontaneous pneumothorax) or from trauma to the chest.  Shingles infection (herpes zoster virus). The chest wall is composed of bones, muscles, and cartilage. Any of these can be the source of the pain.  The bones can be bruised by injury.  The muscles or cartilage can be strained by coughing or overwork.  The cartilage can be affected by inflammation and become sore (costochondritis). DIAGNOSIS  Lab tests or other studies may be needed to find the cause of your pain. Your health care provider may have you take a test called an ambulatory electrocardiogram (ECG). An ECG  records your heartbeat patterns over a 24-hour period. You may also have other tests, such as:  Transthoracic echocardiogram (TTE). During echocardiography, sound waves are used to evaluate how blood flows through your heart.  Transesophageal echocardiogram (TEE).  Cardiac monitoring. This allows your health care provider to monitor your heart rate and rhythm in real time.  Holter monitor. This is a portable device that records your heartbeat and can help diagnose heart arrhythmias. It allows your health care provider to track your heart activity for several days, if needed.  Stress tests by exercise or by giving medicine that makes the heart beat faster. TREATMENT   Treatment depends on what may be causing your chest pain. Treatment may include:  Acid blockers for heartburn.  Anti-inflammatory medicine.  Pain medicine for inflammatory conditions.  Antibiotics if an infection is present.  You may be advised to change lifestyle habits. This includes stopping smoking and avoiding alcohol, caffeine, and chocolate.  You may be advised to keep your head raised (elevated) when sleeping.  This reduces the chance of acid going backward from your stomach into your esophagus. Most of the time, nonspecific chest pain will improve within 2-3 days with rest and mild pain medicine.  HOME CARE INSTRUCTIONS   If antibiotics were prescribed, take them as directed. Finish them even if you start to feel better.  For the next few days, avoid physical activities that bring on chest pain. Continue physical activities as directed.  Do not use any tobacco products, including cigarettes, chewing tobacco, or electronic cigarettes.  Avoid drinking alcohol.  Only take medicine as directed by your health care provider.  Follow your health care provider's suggestions for further testing if your chest pain does not go away.  Keep any follow-up appointments you made. If you do not go to an appointment, you  could develop lasting (chronic) problems with pain. If there is any problem keeping an appointment, call to reschedule. SEEK MEDICAL CARE IF:   Your chest pain does not go away, even after treatment.  You have a rash with blisters on your chest.  You have a fever. SEEK IMMEDIATE MEDICAL CARE IF:   You have increased chest pain or pain that spreads to your arm, neck, jaw, back, or abdomen.  You have shortness of breath.  You have an increasing cough, or you cough up blood.  You have severe back or abdominal pain.  You feel nauseous or vomit.  You have severe weakness.  You faint.  You have chills. This is an emergency. Do not wait to see if the pain will go away. Get medical help at once. Call your local emergency services (911 in [place] Do not drive yourself to the hospital. MAKE SURE YOU:   Understand these instructions.  Will watch your condition.  Will get help right away if you are not doing well or get worse. Document Released: 01/19/2005 Document Revised: 04/16/2013 Document Reviewed: 11/15/2007 [facility] Patient Information 2015 [place], [place]. This information is not intended to replace advice given to you by your health care provider. Make sure you discuss any questions you have with your health care provider.

## 2014-08-18 NOTE — Assessment & Plan Note (Addendum)
Will f/u with Dr. Buddy Duty 5/13

## 2014-08-19 ENCOUNTER — Encounter: Payer: Self-pay | Admitting: Internal Medicine

## 2014-08-19 NOTE — Progress Notes (Signed)
INTERNAL MEDICINE TEACHING ATTENDING ADDENDUM - Jayanna Kroeger, MD: I reviewed and discussed at the time of visit with the resident Dr. McLean, the patient's medical history, physical examination, diagnosis and results of pertinent tests and treatment and I agree with the patient's care as documented.  

## 2014-09-03 ENCOUNTER — Other Ambulatory Visit: Payer: Self-pay | Admitting: Internal Medicine

## 2014-09-09 ENCOUNTER — Other Ambulatory Visit: Payer: Self-pay | Admitting: Internal Medicine

## 2014-09-09 ENCOUNTER — Other Ambulatory Visit: Payer: Self-pay | Admitting: Pulmonary Disease

## 2014-09-10 ENCOUNTER — Encounter: Payer: Self-pay | Admitting: *Deleted

## 2014-09-17 ENCOUNTER — Other Ambulatory Visit: Payer: Medicaid Other

## 2014-09-17 ENCOUNTER — Encounter: Payer: Self-pay | Admitting: Internal Medicine

## 2014-09-18 ENCOUNTER — Other Ambulatory Visit (INDEPENDENT_AMBULATORY_CARE_PROVIDER_SITE_OTHER): Payer: Medicaid Other

## 2014-09-18 DIAGNOSIS — E559 Vitamin D deficiency, unspecified: Secondary | ICD-10-CM | POA: Diagnosis present

## 2014-09-18 LAB — HEMOGLOBIN A1C: Hemoglobin A1C: 5.8

## 2014-09-19 ENCOUNTER — Other Ambulatory Visit: Payer: Self-pay | Admitting: Internal Medicine

## 2014-09-19 LAB — VITAMIN D 25 HYDROXY (VIT D DEFICIENCY, FRACTURES): Vit D, 25-Hydroxy: 34 ng/mL (ref 30–100)

## 2014-09-19 MED ORDER — VITAMIN D (CHOLECALCIFEROL) 25 MCG (1000 UT) PO CAPS: 1000.0000 mg | ORAL_CAPSULE | Freq: Every day | ORAL | Status: DC

## 2014-09-21 NOTE — Progress Notes (Signed)
Cardiology Office Note   Date:  09/24/2014   ID:  [name] [name], DOB 1960/08/22, MRN [id number]  PCP:  Cresenciano Genre, MD  Cardiologist:   Jenkins Rouge, MD   No chief complaint on file.     History of Present Illness: [name] [name] is a 54 y.o. female who presents for evaluation of atypical chest pain.  History of adrenal insufficiency, HIV, Hepatitis C , pituitary microadenoma and pancreatitis.  She is a Acupuncturist witness and will not use blood products Pain since diagnosed with addisons.  Exertional usually.  Not positional sometimes pleuritic  No rest pain  Pain in chest started with all the cramping pain she had during diagnosis of Addison's that was worse in abdomen and legs.  Noted some LE edema since started on steroids.  On norvasc for years   CRF: HTN and elevated lipids  Studies Reviewed  Had normal stress echo in 2013  Normal ABI's done for leg cramps 07/25/14   Normal LLE Venous Duplex 05/27/14  Past Medical History  Diagnosis Date  . HIV infection     1994  . Hepatitis C     1b; 02/2012 viral quant [id number]  . Hypertension   . GERD (gastroesophageal reflux disease)   . Allergy   . Depression   . CHF (congestive heart failure)   . Diverticulitis   . Arthritis   . Chronic back pain   . Hyperlipidemia   . Muscle cramps at night   . History of shingles   . Pancreatitis     mild to mod pancreatitis ct 2002   . Bulging lumbar disc   . Refusal of blood transfusions as patient is Jehovah's Witness   . Prediabetes     HA1C 5.8 09/05/14 Dr. Buddy Duty results     Past Surgical History  Procedure Laterality Date  . Colectomy      2003 for diverticulitis  . Hand surgery    . Bunionectomy      b/l  . Shoulder surgery      left  . Tonsillectomy    . Nasal sinus surgery       Current Outpatient Prescriptions  Medication Sig Dispense Refill  . amLODipine (NORVASC) 5 MG tablet TAKE 1 TABLET BY MOUTH ONCE DAILY 90 tablet 1  . baclofen (LIORESAL) 10 MG tablet TAKE 1  TABLET BY MOUTH 3 TIMES A DAY 90 tablet 1  . cetirizine (ZYRTEC) 10 MG tablet TAKE 1 TABLET BY MOUTH ONCE DAILY AS NEEDED FOR ALLERGIES 90 tablet 0  . diazepam (VALIUM) 5 MG tablet Take 1 tablet (5 mg total) by mouth at bedtime as needed. 30 tablet 2  . diphenhydrAMINE (BENADRYL) 25 MG tablet TAKE 1-2 TABLETS BY MOUTH EVERY NIGHT AT BEDTIME AS NEEDED FOR ITCHING 20 tablet 1  . hydrocortisone (CORTEF) 10 MG tablet TAKE 1 TABLET BY MOUTH EVERY MORNING AT 8AM 45 tablet 1  . hydrOXYzine (ATARAX/VISTARIL) 25 MG tablet TAKE 1 TABLET BY MOUTH EVERY 8 HOURS AS NEEDED FOR ITCHING 60 tablet 1  . methocarbamol (ROBAXIN) 500 MG tablet TAKE 1 TABLET BY MOUTH EVERY 8 HOURS AS NEEDED FOR MUSCLE SPASMS 90 tablet 3  . metoprolol tartrate (LOPRESSOR) 25 MG tablet TAKE 1/2 TABLET BY MOUTH TWICE A DAY 30 tablet 1  . montelukast (SINGULAIR) 10 MG tablet TAKE 1 TABLET BY MOUTH EVERY DAY 90 tablet 1  . naproxen (NAPROSYN) 500 MG tablet Take 1 tablet (500 mg total) by mouth 2 (two) times  daily as needed for moderate pain (Take with food). 60 tablet 11  . NORVIR 100 MG TABS tablet TAKE 1 TABLET BY MOUTH EVERY DAY WITH BREAKFAST 30 tablet 2  . Olopatadine HCl (PATADAY) 0.2 % SOLN Apply 1 drop to eye daily as needed. For allergies     . pantoprazole (PROTONIX) 20 MG tablet Take 1 tablet (20 mg total) by mouth 2 (two) times daily. 60 tablet 5  . Potassium Chloride 40 MEQ/15ML (20%) SOLN TAKE 15ML BY MOUTH EVERY MORNING 473 mL 1  . PROAIR HFA 108 (90 BASE) MCG/ACT inhaler INHALE 1-2 PUFFS BY MOUTH INTO THE LUNGS EVERY 6 HOURS AS NEEDED 8.5 g 1  . promethazine (PHENERGAN) 50 MG tablet TAKE 1/2 TABLET BY MOUTH EVERY 6 HOURS AS NEEDED FOR NAUSEA OR VOMITING 1 tablet 1  . traMADol (ULTRAM) 50 MG tablet TAKE 2 TABLETS BY MOUTH EVERY 4-6 HOURS AS NEEDED FOR PAIN ; MUST LAST 30 DAYS 90 tablet 1  . TRUVADA 200-300 MG per tablet TAKE 1 TABLET BY MOUTH EVERY DAY 30 tablet 10  . Vitamin D, Cholecalciferol, 1000 UNITS CAPS Take 1,000 mg  by mouth daily. 90 capsule 1  . PREZISTA 800 MG tablet TAKE 1 TABLET BY MOUTH EVERY DAY WITH BREAKFAST 30 tablet 2  . [DISCONTINUED] hydrochlorothiazide (HYDRODIURIL) 25 MG tablet Take 25 mg by mouth daily.     No current facility-administered medications for this visit.    Allergies:   Acetaminophen; Dyazide; Lisinopril; Triamterene; and Morphine    Social History:  The patient  reports that she quit smoking about 7 years ago. Her smoking use included Cigarettes. She has never used smokeless tobacco. She reports that she does not drink alcohol or use illicit drugs.   Family History:  The patient's family history includes Cancer in her sister; Colon polyps in her brother; Diabetes in her father and mother; Heart disease in her father and mother; Hepatitis in her sister; Stroke in her father, mother, and other.    ROS:  Please see the history of present illness.   Otherwise, review of systems are positive for none.   All other systems are reviewed and negative.    PHYSICAL EXAM: VS:  BP 112/68 mmHg  Pulse 82  Ht 5\' 1"  (1.549 m)  Wt 75.805 kg (167 lb 1.9 oz)  BMI 31.59 kg/m2  SpO2 98% , BMI Body mass index is 31.59 kg/(m^2). Affect appropriate Overweight black female  HEENT: normal Neck supple with no adenopathy JVP normal no bruits no thyromegaly Lungs clear with no wheezing and good diaphragmatic motion Heart:  S1/S2 no murmur, no rub, gallop or click PMI normal Abdomen: benighn, BS positve, no tenderness, no AAA no bruit.  No HSM or HJR Distal pulses intact with no bruits Trace LLE edema Neuro non-focal Skin warm and dry No muscular weakness    EKG:  08/18/14  SR rate 95  Normal ECG    Recent Labs: 06/26/2014: TSH 0.695 08/04/2014: ALT 35 08/05/2014: Magnesium 2.0 08/09/2014: Hemoglobin 12.4; Platelets 234 08/18/2014: BUN 11; Creatinine 0.76; Potassium 3.8; Sodium 140    Lipid Panel    Component Value Date/Time   CHOL 208* 05/26/2014 1542   TRIG 154* 05/26/2014  1542   HDL 58 05/26/2014 1542   CHOLHDL 3.6 05/26/2014 1542   VLDL 31 05/26/2014 1542   LDLCALC 119* 05/26/2014 1542      Wt Readings from Last 3 Encounters:  09/24/14 75.805 kg (167 lb 1.9 oz)  08/18/14 75.297 kg (166  lb)  08/11/14 79.878 kg (176 lb 1.6 oz)      Other studies Reviewed: Additional studies/ records that were reviewed today include: Hospital records in [place] .    ASSESSMENT AND PLAN:  1.  Chest Pain:  Somewhat concerning given exertional nature  Will call nitro in continue asa and beta blocker f/u stress echo 2. Addisons  Discussed stress does steroids  F/u endocrine 3. HTN;  Well controlled.  Continue current medications and low sodium Dash type diet.      Current medicines are reviewed at length with the patient today.  The patient does not have concerns regarding medicines.  The following changes have been made:  SL nitro called in   Labs/ tests ordered today include:  Stress echo   No orders of the defined types were placed in this encounter.     Disposition:   FU with 6 months if stress echo ok     Signed, Jenkins Rouge, MD  09/24/2014 10:21 AM    [facility] [address], [place], [place]  [address] Phone: [phone]; Fax: [phone]

## 2014-09-24 ENCOUNTER — Ambulatory Visit (INDEPENDENT_AMBULATORY_CARE_PROVIDER_SITE_OTHER): Payer: Medicaid Other | Admitting: Cardiovascular Disease

## 2014-09-24 ENCOUNTER — Encounter: Payer: Self-pay | Admitting: Cardiovascular Disease

## 2014-09-24 VITALS — BP 112/68 | HR 82 | Ht 61.0 in | Wt 167.1 lb

## 2014-09-24 DIAGNOSIS — R079 Chest pain, unspecified: Secondary | ICD-10-CM | POA: Diagnosis not present

## 2014-09-24 MED ORDER — NITROGLYCERIN 0.4 MG SL SUBL: 0.4000 mg | SUBLINGUAL_TABLET | SUBLINGUAL | Status: DC | PRN

## 2014-09-24 NOTE — Patient Instructions (Signed)
Medication Instructions:  NO CHANGES  Labwork: NONE  Testing/Procedures: Your physician has requested that you have a stress echocardiogram. For further information please visit [email or web address]. Please follow instruction sheet as given.   Follow-Up: Your physician wants you to follow-up in: [place] will receive a reminder letter in the mail two months in advance. If you don't receive a letter, please call our office to schedule the follow-up appointment.  Any Other Special Instructions Will Be Listed Below (If Applicable).  Nitroglycerin sublingual tablets What is this medicine? NITROGLYCERIN (nye troe GLI ser in) is a type of vasodilator. It relaxes blood vessels, increasing the blood and oxygen supply to your heart. This medicine is used to relieve chest pain caused by angina. It is also used to prevent chest pain before activities like climbing stairs, going outdoors in cold weather, or sexual activity. This medicine may be used for other purposes; ask your health care provider or pharmacist if you have questions. COMMON BRAND NAME(S): Nitroquick, Nitrostat, Nitrotab What should I tell my health care provider before I take this medicine? They need to know if you have any of these conditions: -anemia -head injury, recent stroke, or bleeding in the brain -liver disease -previous heart attack -an unusual or allergic reaction to nitroglycerin, other medicines, foods, dyes, or preservatives -pregnant or trying to get pregnant -breast-feeding How should I use this medicine? Take this medicine by mouth as needed. At the first sign of an angina attack (chest pain or tightness) place one tablet under your tongue. You can also take this medicine 5 to 10 minutes before an event likely to produce chest pain. Follow the directions on the prescription label. Let the tablet dissolve under the tongue. Do not swallow whole. Replace the dose if you accidentally swallow it.  It will help if your mouth is not dry. Saliva around the tablet will help it to dissolve more quickly. Do not eat or drink, smoke or chew tobacco while a tablet is dissolving. If you are not better within 5 minutes after taking ONE dose of nitroglycerin, call 9-1-1 immediately to seek emergency medical care. Do not take more than 3 nitroglycerin tablets over 15 minutes. If you take this medicine often to relieve symptoms of angina, your doctor or health care professional may provide you with different instructions to manage your symptoms. If symptoms do not go away after following these instructions, it is important to call 9-1-1 immediately. Do not take more than 3 nitroglycerin tablets over 15 minutes. Talk to your pediatrician regarding the use of this medicine in children. Special care may be needed. Overdosage: If you think you have taken too much of this medicine contact a poison control center or emergency room at once. NOTE: This medicine is only for you. Do not share this medicine with others. What if I miss a dose? This does not apply. This medicine is only used as needed. What may interact with this medicine? Do not take this medicine with any of the following medications: -certain migraine medicines like ergotamine and dihydroergotamine (DHE) -medicines used to treat erectile dysfunction like sildenafil, tadalafil, and vardenafil -riociguat This medicine may also interact with the following medications: -alteplase -aspirin -heparin -medicines for high blood pressure -medicines for mental depression -other medicines used to treat angina -phenothiazines like chlorpromazine, mesoridazine, prochlorperazine, thioridazine This list may not describe all possible interactions. Give your health care provider a list of all the medicines, herbs, non-prescription drugs, or dietary supplements  you use. Also tell them if you smoke, drink alcohol, or use illegal drugs. Some items may interact with  your medicine. What should I watch for while using this medicine? Tell your doctor or health care professional if you feel your medicine is no longer working. Keep this medicine with you at all times. Sit or lie down when you take your medicine to prevent falling if you feel dizzy or faint after using it. Try to remain calm. This will help you to feel better faster. If you feel dizzy, take several deep breaths and lie down with your feet propped up, or bend forward with your head resting between your knees. You may get drowsy or dizzy. Do not drive, use machinery, or do anything that needs mental alertness until you know how this drug affects you. Do not stand or sit up quickly, especially if you are an older patient. This reduces the risk of dizzy or fainting spells. Alcohol can make you more drowsy and dizzy. Avoid alcoholic drinks. Do not treat yourself for coughs, colds, or pain while you are taking this medicine without asking your doctor or health care professional for advice. Some ingredients may increase your blood pressure. What side effects may I notice from receiving this medicine? Side effects that you should report to your doctor or health care professional as soon as possible: -blurred vision -dry mouth -skin rash -sweating -the feeling of extreme pressure in the head -unusually weak or tired Side effects that usually do not require medical attention (report to your doctor or health care professional if they continue or are bothersome): -flushing of the face or neck -headache -irregular heartbeat, palpitations -nausea, vomiting This list may not describe all possible side effects. Call your doctor for medical advice about side effects. You may report side effects to FDA at 1-800-FDA-1088. Where should I keep my medicine? Keep out of the reach of children. Store at room temperature between 20 and 25 degrees C (68 and 77 degrees F). Store in [facility]. Protect from light  and moisture. Keep tightly closed. Throw away any unused medicine after the expiration date. NOTE: This sheet is a summary. It may not cover all possible information. If you have questions about this medicine, talk to your doctor, pharmacist, or health care provider.  2015, Elsevier/Gold Standard. (2013-02-07 17:57:36)

## 2014-09-25 ENCOUNTER — Telehealth: Payer: Self-pay | Admitting: *Deleted

## 2014-09-25 NOTE — Telephone Encounter (Signed)
Pt states she never got a call about her vit d level and whether to continue taking her vit d, please advise

## 2014-10-01 ENCOUNTER — Encounter: Payer: Self-pay | Admitting: Cardiology

## 2014-10-01 ENCOUNTER — Encounter: Payer: Self-pay | Admitting: Cardiovascular Disease

## 2014-10-07 ENCOUNTER — Other Ambulatory Visit: Payer: Self-pay | Admitting: Internal Medicine

## 2014-10-07 ENCOUNTER — Other Ambulatory Visit: Payer: Self-pay | Admitting: Infectious Diseases

## 2014-10-07 DIAGNOSIS — K219 Gastro-esophageal reflux disease without esophagitis: Secondary | ICD-10-CM

## 2014-10-07 DIAGNOSIS — B20 Human immunodeficiency virus [HIV] disease: Secondary | ICD-10-CM

## 2014-10-08 NOTE — Telephone Encounter (Signed)
Called to ppa

## 2014-10-10 ENCOUNTER — Other Ambulatory Visit (HOSPITAL_COMMUNITY): Payer: Medicaid Other

## 2014-10-16 ENCOUNTER — Telehealth (HOSPITAL_COMMUNITY): Payer: Self-pay | Admitting: *Deleted

## 2014-10-16 LAB — HM DIABETES EYE EXAM

## 2014-10-16 NOTE — Telephone Encounter (Signed)
Patient given detailed instructions per Stress Echo information sheet for test on 10/21/14 at 7:30. Patient Notified to arrive 15 minutes early, and that it is imperative to arrive on time for appointment to keep from having the test rescheduled. Patient verbalized understanding. Hubbard Robinson, RN

## 2014-10-21 ENCOUNTER — Ambulatory Visit (HOSPITAL_COMMUNITY): Payer: Medicaid Other | Attending: Internal Medicine

## 2014-10-21 DIAGNOSIS — I10 Essential (primary) hypertension: Secondary | ICD-10-CM | POA: Diagnosis not present

## 2014-10-21 DIAGNOSIS — E2740 Unspecified adrenocortical insufficiency: Secondary | ICD-10-CM | POA: Diagnosis not present

## 2014-10-21 DIAGNOSIS — I509 Heart failure, unspecified: Secondary | ICD-10-CM | POA: Diagnosis not present

## 2014-10-21 DIAGNOSIS — E785 Hyperlipidemia, unspecified: Secondary | ICD-10-CM | POA: Insufficient documentation

## 2014-10-21 DIAGNOSIS — B20 Human immunodeficiency virus [HIV] disease: Secondary | ICD-10-CM | POA: Insufficient documentation

## 2014-10-21 DIAGNOSIS — B1920 Unspecified viral hepatitis C without hepatic coma: Secondary | ICD-10-CM | POA: Diagnosis not present

## 2014-10-21 DIAGNOSIS — R079 Chest pain, unspecified: Secondary | ICD-10-CM | POA: Insufficient documentation

## 2014-10-22 ENCOUNTER — Encounter: Payer: Self-pay | Admitting: *Deleted

## 2014-10-23 ENCOUNTER — Encounter: Payer: Self-pay | Admitting: Infectious Diseases

## 2014-10-23 ENCOUNTER — Ambulatory Visit (INDEPENDENT_AMBULATORY_CARE_PROVIDER_SITE_OTHER): Payer: Medicaid Other | Admitting: Infectious Diseases

## 2014-10-23 VITALS — BP 153/92 | HR 82 | Temp 98.4°F | Wt 166.0 lb

## 2014-10-23 DIAGNOSIS — B20 Human immunodeficiency virus [HIV] disease: Secondary | ICD-10-CM | POA: Diagnosis not present

## 2014-10-23 DIAGNOSIS — E271 Primary adrenocortical insufficiency: Secondary | ICD-10-CM

## 2014-10-23 MED ORDER — DARUNAVIR-COBICISTAT 800-150 MG PO TABS: 1.0000 | ORAL_TABLET | Freq: Every day | ORAL | Status: DC

## 2014-10-23 NOTE — Progress Notes (Signed)
   Subjective:    [name], female    DOB: [birth year], 54 y.o.   MRN: [id number]  HPI 54 yo F with hx of Hep C (tx at [facility] completed 04-2013, repeat Hep C RNA -) and HIV+ (prev LXVr/TRV). She had nasal septoplasty 01-05-10. She was previously changed to [place] in 2013. She was then changed to TRV/DTGV in anticipation of starting Hep C treatment.  She developed a rash on this and was then changed to ISN/TRV (which she takes with hydroxizine due to pruritis, rash).  She was changed to ATVc/TRV which her rash persisted on, then was taken off ART. Was seen December 2015 and restarted ART due to low CD4. Now on DRVr/TRV.  Was hospitalized in April and found to have pituitary adenoma (being watched), addison's disease, borderline DM. Had cardiac stress echo 10-21-14 normal.  Has been taking NTG, having CP. This has helped, has "never had to take 2".  Her LE cramps have improved.  Today complains of fatigue, wants handicap sticker.  On prednisone, lopressor. Has endo f/u in August.   HIV 1 RNA QUANT (copies/mL)  Date Value  05/26/2014 110*  03/17/2014 26300*  11/05/2013 <20   CD4 T CELL ABS (/uL)  Date Value  05/26/2014 400  03/17/2014 460  11/05/2013 900   Review of Systems See above.      Objective:   Physical Exam  Constitutional: She appears well-developed and well-nourished.  HENT:  Mouth/Throat: No oropharyngeal exudate.  Eyes: EOM are normal. Pupils are equal, round, and reactive to light.  Neck: Neck supple.  Cardiovascular: Normal rate, regular rhythm and normal heart sounds.   Pulmonary/Chest: Effort normal and breath sounds normal.  Abdominal: Soft. Bowel sounds are normal. She exhibits no distension. There is no tenderness.  Lymphadenopathy:    She has no cervical adenopathy.       Assessment & Plan:

## 2014-10-23 NOTE — Assessment & Plan Note (Signed)
Hopefully decreasing her prednisone will decrease her wt gain. Wt gain will hard to improve as she cannot exercise

## 2014-10-23 NOTE — Addendum Note (Signed)
Addended by: Romana Juniper D on: 10/23/2014 02:46 PM   Modules accepted: Orders

## 2014-10-23 NOTE — Assessment & Plan Note (Signed)
She would like her labs rechecked today.  Will change her DRVr to [facility] to see if it helps her addisons and if it can decrease her pill # Will see her back in 3 months.

## 2014-10-24 LAB — T-HELPER CELL (CD4) - (RCID CLINIC ONLY)
CD4 % Helper T Cell: 20 % — ABNORMAL LOW (ref 33–55)
CD4 T Cell Abs: 520 /uL (ref 400–2700)

## 2014-10-24 LAB — HIV-1 RNA QUANT-NO REFLEX-BLD
HIV 1 RNA Quant: 21 copies/mL (ref <20)
HIV-1 RNA Quant, Log: 1.32 {Log} — ABNORMAL HIGH (ref <1.30)

## 2014-11-04 ENCOUNTER — Other Ambulatory Visit: Payer: Self-pay | Admitting: Infectious Diseases

## 2014-11-04 ENCOUNTER — Other Ambulatory Visit: Payer: Self-pay | Admitting: Internal Medicine

## 2014-11-05 NOTE — Telephone Encounter (Signed)
Do you prescribe valium for patient?

## 2014-11-28 NOTE — Telephone Encounter (Signed)
Has appt sch with new PCP  Has seen Dr Buddy Duty since [facility] med rec. Would you pls request his notes so I can ensure proper dosing of the solu-cortef?

## 2014-12-02 ENCOUNTER — Other Ambulatory Visit: Payer: Self-pay | Admitting: Infectious Diseases

## 2014-12-03 ENCOUNTER — Other Ambulatory Visit: Payer: Self-pay | Admitting: *Deleted

## 2014-12-04 ENCOUNTER — Encounter: Payer: Self-pay | Admitting: Pulmonary Disease

## 2014-12-04 ENCOUNTER — Ambulatory Visit (INDEPENDENT_AMBULATORY_CARE_PROVIDER_SITE_OTHER): Payer: Medicaid Other | Admitting: Pulmonary Disease

## 2014-12-04 VITALS — BP 160/80 | HR 82 | Temp 98.0°F | Wt 166.1 lb

## 2014-12-04 DIAGNOSIS — R7309 Other abnormal glucose: Secondary | ICD-10-CM | POA: Diagnosis not present

## 2014-12-04 DIAGNOSIS — K7400 Hepatic fibrosis, unspecified: Secondary | ICD-10-CM | POA: Insufficient documentation

## 2014-12-04 DIAGNOSIS — F32A Depression, unspecified: Secondary | ICD-10-CM

## 2014-12-04 DIAGNOSIS — M545 Low back pain: Secondary | ICD-10-CM

## 2014-12-04 DIAGNOSIS — Z1239 Encounter for other screening for malignant neoplasm of breast: Secondary | ICD-10-CM | POA: Diagnosis not present

## 2014-12-04 DIAGNOSIS — E2749 Other adrenocortical insufficiency: Secondary | ICD-10-CM | POA: Diagnosis not present

## 2014-12-04 DIAGNOSIS — R7303 Prediabetes: Secondary | ICD-10-CM

## 2014-12-04 DIAGNOSIS — F329 Major depressive disorder, single episode, unspecified: Secondary | ICD-10-CM

## 2014-12-04 DIAGNOSIS — N2589 Other disorders resulting from impaired renal tubular function: Secondary | ICD-10-CM | POA: Diagnosis not present

## 2014-12-04 DIAGNOSIS — I10 Essential (primary) hypertension: Secondary | ICD-10-CM

## 2014-12-04 DIAGNOSIS — G8929 Other chronic pain: Secondary | ICD-10-CM

## 2014-12-04 MED ORDER — VITAMIN D (CHOLECALCIFEROL) 25 MCG (1000 UT) PO CAPS: 1000.0000 mg | ORAL_CAPSULE | Freq: Every day | ORAL | Status: DC

## 2014-12-04 MED ORDER — METOPROLOL TARTRATE 25 MG PO TABS: 25.0000 mg | ORAL_TABLET | Freq: Two times a day (BID) | ORAL | Status: DC

## 2014-12-04 MED ORDER — CITALOPRAM HYDROBROMIDE 20 MG PO TABS: 20.0000 mg | ORAL_TABLET | Freq: Every day | ORAL | Status: DC

## 2014-12-04 MED ORDER — TRAMADOL HCL 50 MG PO TABS: ORAL_TABLET | ORAL | Status: DC

## 2014-12-04 MED ORDER — DIAZEPAM 5 MG PO TABS: 5.0000 mg | ORAL_TABLET | Freq: Every evening | ORAL | Status: DC | PRN

## 2014-12-04 MED ORDER — HYDROCORTISONE 10 MG PO TABS: ORAL_TABLET | ORAL | Status: DC

## 2014-12-04 NOTE — Assessment & Plan Note (Signed)
Per note 07/22/2013 by Dr. Lolita Lenz: Patient had chronic hepatitis C, genotype 1b, stage 2 of fibrosis in liver biopsy performed in December 2013. Underwent 12 week course of simeprevir and sofosbuvir between October 2014 and January 2015. Now resolved after achieving sustained virological response with HCV Rx.  Regarding liver follow-up, given that she had stage 2 of fibrosis in liver biopsy and there was one year gap between the biopsy and treatment initiation, recommend liver monitoring with annual liver ultrasound, to be done by her primary providers.  Plan: -Will discuss liver ultrasound at follow up visit.

## 2014-12-04 NOTE — Patient Instructions (Addendum)
Monitor use of new medication citalopram. If you have a seizure or any new medical problems, please call the clinic or go to the ED.  General Instructions:   Thank you for bringing your medicines today. This helps [place] keep you safe from mistakes.   Progress Toward Treatment Goals:  Treatment Goal 12/04/2014  Blood pressure deteriorated    Self Care Goals & Plans:  Self Care Goal 08/18/2014  Manage my medications take my medicines as prescribed; bring my medications to every visit; refill my medications on time; follow the sick day instructions if I am sick  Monitor my health -  Eat healthy foods drink diet soda or water instead of juice or soda; eat more vegetables; eat foods that are low in salt; eat baked foods instead of fried foods; eat fruit for snacks and desserts; eat smaller portions  Be physically active find an activity I enjoy  Meeting treatment goals maintain the current self-care plan

## 2014-12-04 NOTE — Assessment & Plan Note (Addendum)
Likely steroid induced. Hgb A1c checked by endocrinology. 5.8% on 09/05/2014.  Plan: Continue to monitor

## 2014-12-04 NOTE — Assessment & Plan Note (Signed)
Refilled tramadol. She takes 100mg  once to twice daily as needed. Encouraged her to follow up with PM&R.

## 2014-12-04 NOTE — Assessment & Plan Note (Signed)
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  Plan: -Hold potassium supplementation -Recheck BMP in 1 month

## 2014-12-04 NOTE — Assessment & Plan Note (Addendum)
Noted visit from Dr. Cindra Eves office in May. IGF-1 and prolactin normal. Now taking hydrocortisone 10mg  at 8AM, 5mg  at noon, and 5mg  at 4PM.   Plan: -Encouraged patient to continue to follow up with endocrinology

## 2014-12-04 NOTE — Progress Notes (Signed)
Subjective:   [name], female    DOB: [birth year], 54 y.o.   MRN: [id number]  HPI [name] [name] is a 54 year old woman with history of HIV, hepatitis C, HTN, pre-diabetes, adrenal insufficiency, pituitary microadenoma, GERD, allergic rhinitis, arthritis, depression presenting for follow up. She reports fatigue and insomnia.  Symptoms started with high dose of steroids before coming into hospital. Relying on tramadol and naproxen for back pain. Has not been back at pain clinic since discharge from hospital. Valium helps her sleep. Exercise limited by back pain. Denies feeling down, hopeless or depressed. Has been having little interest or pleasure in doing things. She reports her thyroid function was normal as checked by endocrine.  Last saw Dr. Buddy Duty, endocrine, May 13. Was told to continue steroids. Was having pre-syncope last week, so her steroids were increased to 10, 5, and 5. Dizzy spells are somewhat persistent. Has appointment 12/18/2014. Has poor appetite.  HIV: Seen by Dr. Johnnye Sima, ID on 10/23/2014. CD4 520, HIV VL 21. Changed HIV regimen from Prezista and Norvir to Prezcobix. Instructed to follow up in 3 months.  pituitary adenoma (being watched), addison's disease, borderline DM - follow up with endo?  Chest pain: Seen by Dr. Johnsie Cancel, cardiology on 09/21/2014. Rx for nitro and continue ASA and beta blocker. Stress echo 10/21/2014 normal. Instructed to follow up in 6 months.  Last seen in clinic 08/18/2014 Blurred vision: Seen by Dr. Herbert Deaner, opthalmology on 10/16/2014. 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 Recommend consideration cataract eval with Dr. Herbert Deaner and return for visual field testing.  Muscle cramps: 2/2 adrenal insufficiency. Have improved. Some occasional cramps still.   Vitamin D deficiency: Repeat level 09/18/2014 34.  Review of Systems Constitutional: no fevers, +occasional chills Eyes: +chronic blurred vision Ears, nose, mouth,  throat, and face: +chronic cough/sinus problems Respiratory: +exertional shortness of breath Cardiovascular: +occasional chest pain Gastrointestinal: +nausea, no vomiting, no abdominal pain, no constipation, +diarrhea x 2 days - improving Genitourinary: no dysuria, no hematuria Integument: no rash Hematologic/lymphatic: no bleeding/bruising, no edema Musculoskeletal: +chronic arthralgias, +intermittent myalgias Neurological: no paresthesias, +generalized weakness  Past Medical History  Diagnosis Date  . HIV infection     1994  . Hepatitis C     1b; 02/2012 viral quant [id number]  . Hypertension   . GERD (gastroesophageal reflux disease)   . Allergy   . Depression   . CHF (congestive heart failure)   . Diverticulitis   . Arthritis   . Chronic back pain   . Hyperlipidemia   . Muscle cramps at night   . History of shingles   . Pancreatitis     mild to mod pancreatitis ct 2002   . Bulging lumbar disc   . Refusal of blood transfusions as patient is Jehovah's Witness   . Prediabetes     HA1C 5.8 09/05/14 Dr. Buddy Duty results     Current Outpatient Prescriptions on File Prior to Visit  Medication Sig Dispense Refill  . amLODipine (NORVASC) 5 MG tablet TAKE 1 TABLET BY MOUTH ONCE DAILY 90 tablet 1  . baclofen (LIORESAL) 10 MG tablet TAKE 1 TABLET BY MOUTH 3 TIMES A DAY 90 tablet 1  . cetirizine (ZYRTEC) 10 MG tablet TAKE 1 TABLET BY MOUTH ONCE DAILY AS NEEDED FOR ALLERGIES 90 tablet 3  . darunavir-cobicistat (PREZCOBIX) 800-150 MG per tablet Take 1 tablet by mouth daily. Swallow whole. Do NOT crush, break or chew tablets. Take with food. 90 tablet  3  . diazepam (VALIUM) 5 MG tablet TAKE 1 TABLET BY MOUTH EVERY NIGHT AT BEDTIME AS NEEDED 30 tablet 1  . diphenhydrAMINE (BENADRYL) 25 MG tablet TAKE 1-2 TABLETS BY MOUTH EVERY NIGHT AT BEDTIME AS NEEDED FOR ITCHING 20 tablet 1  . hydrocortisone (CORTEF) 10 MG tablet TAKE 1 TABLET BY MOUTH EVERY MORNING AT 8AM 45 tablet 1  . hydrOXYzine  (ATARAX/VISTARIL) 25 MG tablet TAKE 1 TABLET BY MOUTH EVERY 8 HOURS AS NEEDED FOR ITCHING 60 tablet 1  . methocarbamol (ROBAXIN) 500 MG tablet TAKE 1 TABLET BY MOUTH EVERY 8 HOURS AS NEEDED FOR MUSCLE SPASMS 90 tablet 3  . metoprolol tartrate (LOPRESSOR) 25 MG tablet TAKE 1/2 TABLET BY MOUTH TWICE A DAY 30 tablet 3  . montelukast (SINGULAIR) 10 MG tablet TAKE 1 TABLET BY MOUTH EVERY DAY 90 tablet 1  . naproxen (NAPROSYN) 500 MG tablet Take 1 tablet (500 mg total) by mouth 2 (two) times daily as needed for moderate pain (Take with food). 60 tablet 11  . nitroGLYCERIN (NITROSTAT) 0.4 MG SL tablet Place 1 tablet (0.4 mg total) under the tongue every 5 (five) minutes as needed for chest pain. 25 tablet 3  . Olopatadine HCl (PATADAY) 0.2 % SOLN Apply 1 drop to eye daily as needed. For allergies     . pantoprazole (PROTONIX) 20 MG tablet TAKE 1 TABLET BY MOUTH TWICE DAILY 60 tablet 1  . Potassium Chloride 40 MEQ/15ML (20%) SOLN TAKE 15ML BY MOUTH EVERY MORNING 473 mL 2  . PROAIR HFA 108 (90 BASE) MCG/ACT inhaler INHALE 1-2 PUFFS BY MOUTH INTO THE LUNGS EVERY 6 HOURS AS NEEDED 8.5 g 1  . promethazine (PHENERGAN) 50 MG tablet TAKE 1/2 TABLET BY MOUTH EVERY 6 HOURS AS NEEDED FOR NAUSEA OR VOMITING 1 tablet 1  . traMADol (ULTRAM) 50 MG tablet TAKE 2 TABLETS BY MOUTH EVERY 4-6 HOURS AS NEEDED FOR PAIN ; MUST LAST 30 DAYS 90 tablet 0  . TRUVADA 200-300 MG per tablet TAKE 1 TABLET BY MOUTH EVERY DAY 30 tablet 10  . Vitamin D, Cholecalciferol, 1000 UNITS CAPS Take 1,000 mg by mouth daily. 90 capsule 1  . [DISCONTINUED] hydrochlorothiazide (HYDRODIURIL) 25 MG tablet Take 25 mg by mouth daily.     No current facility-administered medications on file prior to visit.    Today's Vitals   12/04/14 1454 12/04/14 1455  BP: 155/80   Pulse: 90   Temp: 98 F (36.7 C)   TempSrc: Oral   Weight: 166 lb 1.6 oz (75.342 kg)   SpO2: 100%   PainSc:  10-Worst pain ever    Objective:  Physical Exam  Constitutional:  She is oriented to person, place, and time. She appears well-developed and well-nourished. No distress.  HENT:  Head: Normocephalic and atraumatic.  Eyes: EOM are normal.  Cardiovascular: Normal rate, regular rhythm, normal heart sounds and intact distal pulses.   Pulmonary/Chest: Effort normal and breath sounds normal. No respiratory distress. She has no wheezes.  Abdominal: Soft. Bowel sounds are normal. She exhibits no distension. There is no tenderness.  Musculoskeletal: She exhibits no edema.  Neurological: She is alert and oriented to person, place, and time.    Assessment & Plan:  Please refer to problem based charting.

## 2014-12-04 NOTE — Assessment & Plan Note (Addendum)
Fatigue and insomnia may be manifestations of depression. PHQ9 performed.   Little interest or pleasure in doing things, feeling down, trouble concentrating several days Trouble with sleep, feeling tired, poor appetite nearly every day Moving or speaking slowly or fidgety/restless more than half the days  Total score 14. These problems are somewhat difficult.  Plan:  -Start citalopram 20mg  daily -Social work consult to talk about behavioral health options for psychotherapy -Continue Valium 5mg  QHS prn for now and will try to wean -Follow up in 1 month

## 2014-12-04 NOTE — Assessment & Plan Note (Signed)
BP Readings from Last 3 Encounters:  12/04/14 160/80  10/23/14 153/92  09/24/14 112/68    Lab Results  Component Value Date   NA 140 08/18/2014   K 3.8 08/18/2014   CREATININE 0.76 08/18/2014    Assessment: Blood pressure control: mildly elevated Progress toward BP goal:  deteriorated  Plan: Medications:  Increase metoprolol to 25mg  BID. Continue amlodipine 5mg  daily Other plans: Follow up in 1 month for BP recheck.

## 2014-12-05 ENCOUNTER — Telehealth: Payer: Self-pay | Admitting: Pulmonary Disease

## 2014-12-05 NOTE — Telephone Encounter (Signed)
Will from [place] called requesting the nurse to call back to clarify Rx. Please call Will back @ [phone].

## 2014-12-05 NOTE — Telephone Encounter (Signed)
Pharmacy called to verify script sent during appt 8/11 with dr Randell Patient, metoprolol 1 twice daily but script was for #30, verbal given to change to #60, please change the med list if you approve, thanks

## 2014-12-05 NOTE — Progress Notes (Signed)
Internal Medicine Clinic Attending  Case discussed with Dr. Krall soon after the resident saw the patient.  We reviewed the resident's history and exam and pertinent patient test results.  I agree with the assessment, diagnosis, and plan of care documented in the resident's note. 

## 2014-12-05 NOTE — Telephone Encounter (Signed)
Med has been refilled.

## 2014-12-08 ENCOUNTER — Telehealth: Payer: Self-pay | Admitting: Licensed Clinical Social Worker

## 2014-12-08 NOTE — Telephone Encounter (Signed)
[name] [name] was referred to CSW for behavioral health resources for depression.  CSW placed call to [name] [name].  Pt states "What?!  I know I have medical problems but I didn't know I was depressed."  "I know what her note said."  CSW discussed options available through pt's insurance, stating behavioral health is a covered service and their are multiple providers available.  [name] [name] thanked [place] as she states she was unaware of services available.  CSW will mail pt information on [place].

## 2014-12-09 MED ORDER — METOPROLOL TARTRATE 25 MG PO TABS: 25.0000 mg | ORAL_TABLET | Freq: Two times a day (BID) | ORAL | Status: DC

## 2014-12-09 NOTE — Telephone Encounter (Signed)
Updated order. Thanks! Jacques Earthly, MD

## 2014-12-10 ENCOUNTER — Encounter (HOSPITAL_COMMUNITY): Payer: Self-pay | Admitting: *Deleted

## 2014-12-10 ENCOUNTER — Emergency Department (HOSPITAL_COMMUNITY)
Admission: EM | Admit: 2014-12-10 | Discharge: 2014-12-10 | Disposition: A | Discharge status: Home / Self Care | Payer: Medicaid Other | Attending: Emergency Medicine | Admitting: Emergency Medicine

## 2014-12-10 DIAGNOSIS — Z87891 Personal history of nicotine dependence: Secondary | ICD-10-CM | POA: Insufficient documentation

## 2014-12-10 DIAGNOSIS — Z8719 Personal history of other diseases of the digestive system: Secondary | ICD-10-CM | POA: Diagnosis not present

## 2014-12-10 DIAGNOSIS — Z8619 Personal history of other infectious and parasitic diseases: Secondary | ICD-10-CM | POA: Diagnosis not present

## 2014-12-10 DIAGNOSIS — G8929 Other chronic pain: Secondary | ICD-10-CM | POA: Insufficient documentation

## 2014-12-10 DIAGNOSIS — Z79899 Other long term (current) drug therapy: Secondary | ICD-10-CM | POA: Diagnosis not present

## 2014-12-10 DIAGNOSIS — Z86018 Personal history of other benign neoplasm: Secondary | ICD-10-CM | POA: Insufficient documentation

## 2014-12-10 DIAGNOSIS — R51 Headache: Secondary | ICD-10-CM | POA: Diagnosis not present

## 2014-12-10 DIAGNOSIS — Z21 Asymptomatic human immunodeficiency virus [HIV] infection status: Secondary | ICD-10-CM | POA: Insufficient documentation

## 2014-12-10 DIAGNOSIS — Z8639 Personal history of other endocrine, nutritional and metabolic disease: Secondary | ICD-10-CM | POA: Insufficient documentation

## 2014-12-10 DIAGNOSIS — F329 Major depressive disorder, single episode, unspecified: Secondary | ICD-10-CM | POA: Diagnosis not present

## 2014-12-10 DIAGNOSIS — I10 Essential (primary) hypertension: Secondary | ICD-10-CM | POA: Insufficient documentation

## 2014-12-10 DIAGNOSIS — R519 Headache, unspecified: Secondary | ICD-10-CM

## 2014-12-10 LAB — CBC WITH DIFFERENTIAL/PLATELET
BASOS ABS: 0 10*3/uL (ref 0.0–0.1)
Basophils Relative: 0 % (ref 0–1)
EOS PCT: 1 % (ref 0–5)
Eosinophils Absolute: 0.1 10*3/uL (ref 0.0–0.7)
HEMATOCRIT: 40.9 % (ref 36.0–46.0)
Hemoglobin: 13.3 g/dL (ref 12.0–15.0)
LYMPHS ABS: 2.6 10*3/uL (ref 0.7–4.0)
LYMPHS PCT: 26 % (ref 12–46)
MCH: 26.3 pg (ref 26.0–34.0)
MCHC: 32.5 g/dL (ref 30.0–36.0)
MCV: 80.8 fL (ref 78.0–100.0)
MONO ABS: 0.6 10*3/uL (ref 0.1–1.0)
MONOS PCT: 6 % (ref 3–12)
NEUTROS ABS: 6.8 10*3/uL (ref 1.7–7.7)
Neutrophils Relative %: 67 % (ref 43–77)
PLATELETS: 283 10*3/uL (ref 150–400)
RBC: 5.06 MIL/uL (ref 3.87–5.11)
RDW: 15.6 % — AB (ref 11.5–15.5)
WBC: 10.1 10*3/uL (ref 4.0–10.5)

## 2014-12-10 LAB — COMPREHENSIVE METABOLIC PANEL
ALBUMIN: 4.3 g/dL (ref 3.5–5.0)
ALT: 17 U/L (ref 14–54)
ANION GAP: 10 (ref 5–15)
AST: 19 U/L (ref 15–41)
Alkaline Phosphatase: 85 U/L (ref 38–126)
BILIRUBIN TOTAL: 0.6 mg/dL (ref 0.3–1.2)
BUN: 10 mg/dL (ref 6–20)
CO2: 25 mmol/L (ref 22–32)
Calcium: 9.4 mg/dL (ref 8.9–10.3)
Chloride: 104 mmol/L (ref 101–111)
Creatinine, Ser: 0.71 mg/dL (ref 0.44–1.00)
GFR calc Af Amer: >60 mL/min (ref >60)
GFR calc non Af Amer: >60 mL/min (ref >60)
GLUCOSE: 102 mg/dL — AB (ref 65–99)
POTASSIUM: 3.5 mmol/L (ref 3.5–5.1)
SODIUM: 139 mmol/L (ref 135–145)
TOTAL PROTEIN: 8.7 g/dL — AB (ref 6.5–8.1)

## 2014-12-10 MED ORDER — DIPHENHYDRAMINE HCL 50 MG/ML IJ SOLN
25.0000 mg | Freq: Once | INTRAMUSCULAR | Status: AC
  Administered 2014-12-10: 25 mg via INTRAVENOUS
  Filled 2014-12-10: qty 1

## 2014-12-10 MED ORDER — SODIUM CHLORIDE 0.9 % IV BOLUS (SEPSIS)
1000.0000 mL | Freq: Once | INTRAVENOUS | Status: AC
  Administered 2014-12-10: 1000 mL via INTRAVENOUS

## 2014-12-10 MED ORDER — HYDROCORTISONE 5 MG PO TABS
5.0000 mg | ORAL_TABLET | Freq: Every day | ORAL | Status: DC
  Administered 2014-12-10: 5 mg via ORAL
  Filled 2014-12-10: qty 1

## 2014-12-10 MED ORDER — METOCLOPRAMIDE HCL 5 MG/ML IJ SOLN
10.0000 mg | Freq: Once | INTRAMUSCULAR | Status: AC
  Administered 2014-12-10: 10 mg via INTRAVENOUS
  Filled 2014-12-10: qty 2

## 2014-12-10 NOTE — ED Notes (Signed)
Patient no longer in the ED

## 2014-12-10 NOTE — ED Provider Notes (Signed)
CSN: [id number]     Arrival date & time 12/10/14  1337 History   First MD Initiated Contact with Patient 12/10/14 1554     Chief Complaint  Patient presents with  . Headache  . Medication Reaction     (Consider location/radiation/quality/duration/timing/severity/associated sxs/prior Treatment) HPI Comments: 54yo F w/ PMH including HIV, Hep C, HTN, HLD, Addison's disease who presents with headache  and anxiety. The patient states that 2 days ago, her PCP started her on Celexa for her difficulty sleeping. She reports that approximately 1-2 hours after she took the first dose, she had a gradual onset of bitemporal headache that eventually became severe. She had associated lightheadedness, jitteriness/anxiety, a feeling of heart racing sensation, and a feeling of fall or "medicine head." She thought that the symptoms would improve as she began to tolerate the medication and so she took a second dose yesterday. The same symptoms reappeared after she took the medication again and the symptoms have persisted throughout today despite not taking the medication today. She states she feels generally fatigued and complains of a severe headache. No sudden onset of headache or thunderclap headache. No neck stiffness. No associated fevers, cough/cold symptoms, vomiting, or diarrhea. She does endorse some nausea and lightheadedness with standing. No extremity numbness/weakness.  Patient is a 54 y.o. female presenting with headaches. The history is provided by the patient.  Headache   Past Medical History  Diagnosis Date  . HIV infection     1994  . Hepatitis C     genotype 1b, 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 s/p 12 week course of simeprevir and sofosbuvir between October 2014 and January 2015 with resolution.  . Hypertension   . GERD (gastroesophageal reflux disease)   . Depression   . Chronic back pain   . Hyperlipidemia   . History of shingles   . Refusal of blood transfusions as  patient is Jehovah's Witness   . Prediabetes   . Allergic rhinitis 05/09/2006    Centricity Description: RHINITIS, ALLERGIC NOS Qualifier: Diagnosis of  By: Johnnye Sima MD, Dellis Filbert   Centricity Description: ALLERGIC RHINITIS Qualifier: Diagnosis of  By: Johnnye Sima MD, Dellis Filbert    . Pituitary microadenoma 08/08/2014  . Secondary adrenal insufficiency 06/29/2014   Past Surgical History  Procedure Laterality Date  . Colectomy      2003 for diverticulitis  . Hand surgery    . Bunionectomy      b/l  . Shoulder surgery      left  . Tonsillectomy    . Nasal sinus surgery     Family History  Problem Relation Age of Onset  . Heart disease Mother   . Diabetes Mother   . Stroke Mother   . Heart disease Father   . Stroke Father   . Diabetes Father   . Hepatitis Sister     hcv  . Stroke Other   . Colon polyps Brother   . Cancer Sister     lung   Social History  Substance Use Topics  . Smoking status: Former Smoker    Types: Cigarettes    Quit date: 04/26/2007  . Smokeless tobacco: Never Used     Comment: QUIT 2009  . Alcohol Use: No   OB History    No data available     Review of Systems  Neurological: Positive for headaches.    10 Systems reviewed and are negative for acute change except as noted in the HPI.   Allergies  Acetaminophen;  Dyazide; Lisinopril; Triamterene; and Morphine  Home Medications   Prior to Admission medications   Medication Sig Start Date End Date Taking? Authorizing Provider  amLODipine (NORVASC) 5 MG tablet TAKE 1 TABLET BY MOUTH ONCE DAILY Patient taking differently: TAKE 5 MG BY MOUTH DAILY. 09/03/14  Yes Nino Glow McLean-Scocozza, MD  cetirizine (ZYRTEC) 10 MG tablet TAKE 1 TABLET BY MOUTH ONCE DAILY AS NEEDED FOR ALLERGIES Patient taking differently: TAKE 10 MG BY MOUTH DAILY AS NEEDED FOR ALLERGIES 11/28/14  Yes Bartholomew Crews, MD  darunavir-cobicistat (PREZCOBIX) 800-150 MG per tablet Take 1 tablet by mouth daily. Swallow whole. Do NOT crush,  break or chew tablets. Take with food. 10/23/14  Yes Campbell Riches, MD  diazepam (VALIUM) 5 MG tablet Take 1 tablet (5 mg total) by mouth at bedtime as needed. 12/04/14  Yes Milagros Loll, MD  hydrocortisone (CORTEF) 10 MG tablet Take 10mg  at 8AM, 5mg  at 12PM. May take 5mg  at [facility] as needed Patient taking differently: Take 10 mg by mouth 3 (three) times daily. Take 10mg  at 8AM, 5mg  at 12PM. May take 5mg  at [place] as needed 12/04/14  Yes Milagros Loll, MD  metoprolol tartrate (LOPRESSOR) 25 MG tablet Take 1 tablet (25 mg total) by mouth 2 (two) times daily. 12/09/14  Yes Milagros Loll, MD  montelukast (SINGULAIR) 10 MG tablet TAKE 1 TABLET BY MOUTH EVERY DAY Patient taking differently: TAKE 10 MG BY MOUTH DAILY 01/21/14  Yes Nino Glow McLean-Scocozza, MD  naproxen (NAPROSYN) 500 MG tablet Take 1 tablet (500 mg total) by mouth 2 (two) times daily as needed for moderate pain (Take with food). 07/14/14  Yes Oval Linsey, MD  nitroGLYCERIN (NITROSTAT) 0.4 MG SL tablet Place 1 tablet (0.4 mg total) under the tongue every 5 (five) minutes as needed for chest pain. 09/24/14  Yes Josue Hector, MD  Olopatadine HCl (PATADAY) 0.2 % SOLN Apply 1 drop to eye daily as needed. For allergies    Yes Historical Provider, MD  pantoprazole (PROTONIX) 20 MG tablet TAKE 1 TABLET BY MOUTH TWICE DAILY Patient taking differently: TAKE 20 MG BY MOUTH TWICE DAILY 10/08/14  Yes Campbell Riches, MD  PROAIR HFA 108 (90 BASE) MCG/ACT inhaler INHALE 1-2 PUFFS BY MOUTH INTO THE LUNGS EVERY 6 HOURS AS NEEDED Patient taking differently: INHALE 1-2 PUFFS BY MOUTH INTO THE LUNGS EVERY 6 HOURS AS NEEDED FOR SHORTNESS OF BREATH 09/03/14  Yes Nino Glow McLean-Scocozza, MD  promethazine (PHENERGAN) 50 MG tablet TAKE 1/2 TABLET BY MOUTH EVERY 6 HOURS AS NEEDED FOR NAUSEA OR VOMITING Patient taking differently: TAKE 25 MG BY MOUTH EVERY 6 HOURS AS NEEDED FOR NAUSEA OR VOMITING. 08/13/14  Yes Campbell Riches, MD  traMADol (ULTRAM) 50 MG  tablet TAKE 2 TABLETS BY MOUTH EVERY 12 HOURS AS NEEDED FOR PAIN ; MUST LAST 30 DAYS Patient taking differently: Take 100 mg by mouth every 12 (twelve) hours as needed for moderate pain or severe pain.  12/04/14  Yes Milagros Loll, MD  TRUVADA 200-300 MG per tablet TAKE 1 TABLET BY MOUTH EVERY DAY 08/13/14  Yes Campbell Riches, MD  Vitamin D, Cholecalciferol, 1000 UNITS CAPS Take 1,000 mg by mouth daily. 12/04/14  Yes Milagros Loll, MD  citalopram (CELEXA) 20 MG tablet Take 1 tablet (20 mg total) by mouth daily. Patient not taking: Reported on 12/10/2014 12/04/14   Milagros Loll, MD  diphenhydrAMINE (BENADRYL) 25 MG tablet TAKE 1-2 TABLETS BY MOUTH EVERY NIGHT  AT BEDTIME AS NEEDED FOR ITCHING Patient not taking: Reported on 12/10/2014 09/10/14   Nino Glow McLean-Scocozza, MD  methocarbamol (ROBAXIN) 500 MG tablet TAKE 1 TABLET BY MOUTH EVERY 8 HOURS AS NEEDED FOR MUSCLE SPASMS Patient not taking: Reported on 12/10/2014 06/16/14   Charlett Blake, MD   BP 140/81 mmHg  Pulse 70  Temp(Src) 98 F (36.7 C) (Oral)  Resp 18  SpO2 99% Physical Exam  Constitutional: She is oriented to person, place, and time. She appears well-developed and well-nourished. No distress.  HENT:  Head: Normocephalic and atraumatic.  Moist mucous membranes  Eyes: Conjunctivae and EOM are normal. Pupils are equal, round, and reactive to light.  Neck: Neck supple.  Cardiovascular: Normal rate, regular rhythm and normal heart sounds.   No murmur heard. Pulmonary/Chest: Effort normal and breath sounds normal.  Abdominal: Soft. Bowel sounds are normal. She exhibits no distension. There is no tenderness.  Musculoskeletal: She exhibits no edema.  Neurological: She is alert and oriented to person, place, and time. She has normal reflexes. She displays normal reflexes. No cranial nerve deficit.  Fluent speech, no clonus  Skin: Skin is warm and dry.  Psychiatric: Judgment normal.  Anxious, tearful during conversation   Nursing note and vitals reviewed.   ED Course  Procedures (including critical care time) Labs Review Labs Reviewed  COMPREHENSIVE METABOLIC PANEL - Abnormal; Notable for the following:    Glucose, Bld 102 (*)    Total Protein 8.7 (*)    All other components within normal limits  CBC WITH DIFFERENTIAL/PLATELET - Abnormal; Notable for the following:    RDW 15.6 (*)    All other components within normal limits  URINALYSIS, ROUTINE W REFLEX MICROSCOPIC (NOT AT [facility])    Imaging Review No results found. I have personally reviewed and evaluated these lab results as part of my medical decision-making.   EKG Interpretation None      Medications  hydrocortisone (CORTEF) tablet 5 mg (5 mg Oral Given 12/10/14 1701)  sodium chloride 0.9 % bolus 1,000 mL (1,000 mLs Intravenous New Bag/Given 12/10/14 1652)  diphenhydrAMINE (BENADRYL) injection 25 mg (25 mg Intravenous Given 12/10/14 1652)  metoCLOPramide (REGLAN) injection 10 mg (10 mg Intravenous Given 12/10/14 1652)    MDM   Final diagnoses:  Nonintractable headache, unspecified chronicity pattern, unspecified headache type   54 year old female with past medical history above who presents with feelings of anxiety, fatigue, jitteriness, heart racing, and headache that have persisted for the last 2 days since she started Celexa for the first time. At presentation, the patient was awake, alert, anxious and mildly tearful but in no acute distress. Vital signs notable only for mild hypertension. No neurologic deficits on exam. Send blood lab work to evaluate for electrolyte derangement given the patient's underlying Addison's disease and nonspecific symptoms. Gave the patient an IV fluid bolus, Benadryl, and Reglan for her headache. Also gave her her home dose of hydrocortisone that she had missed while in ED.  Labs unremarkable.  On reexamination after receiving medications, the patient voiced that she felt much improved. The patient denies  any neurologic symptoms such as diplopia/vision loss, focal numbness/weakness, balance problems, confusion, or speech difficulty to suggest a life-threatening intracranial process such as intracranial hemorrhage or mass. No sudden onset of headache, thunderclap HA, or association with neck stiffness to suggest SAH. The patient has no clotting risk factors thus venous sinus thrombosis is unlikely. CD4 is >500 and no fevers or other infectious symptoms to suggest meningitis/encephalitis. I  feel that the patient is safe for discharge home without any head imaging at this time. I have reviewed return precautions including development of neurologic symptoms, confusion, lethargy, or difficulty speaking and patient has voiced understanding. Instructed patient follow up with PCP later this week. Patient discharged in satisfactory condition.    Sharlett Iles, MD 12/10/14 [phone]

## 2014-12-10 NOTE — ED Notes (Signed)
Per EMS, pt complains of severe headache for 2 days since she started taking Celexa. Pt states the headache is making her feel nauseas and dizzy. Pt was diagnosed with Addison's in April.

## 2014-12-31 ENCOUNTER — Other Ambulatory Visit: Payer: Self-pay | Admitting: Pulmonary Disease

## 2014-12-31 ENCOUNTER — Other Ambulatory Visit: Payer: Self-pay | Admitting: Infectious Diseases

## 2015-01-05 NOTE — Telephone Encounter (Signed)
Rx called in 

## 2015-01-08 ENCOUNTER — Encounter: Payer: Self-pay | Admitting: Pulmonary Disease

## 2015-01-08 ENCOUNTER — Ambulatory Visit (INDEPENDENT_AMBULATORY_CARE_PROVIDER_SITE_OTHER): Payer: Medicaid Other | Admitting: Pulmonary Disease

## 2015-01-08 VITALS — BP 123/74 | HR 80 | Temp 97.8°F | Ht 61.0 in | Wt 166.1 lb

## 2015-01-08 DIAGNOSIS — B182 Chronic viral hepatitis C: Secondary | ICD-10-CM

## 2015-01-08 DIAGNOSIS — F329 Major depressive disorder, single episode, unspecified: Secondary | ICD-10-CM | POA: Diagnosis not present

## 2015-01-08 DIAGNOSIS — Z23 Encounter for immunization: Secondary | ICD-10-CM

## 2015-01-08 DIAGNOSIS — N2589 Other disorders resulting from impaired renal tubular function: Secondary | ICD-10-CM | POA: Diagnosis not present

## 2015-01-08 DIAGNOSIS — N179 Acute kidney failure, unspecified: Secondary | ICD-10-CM

## 2015-01-08 DIAGNOSIS — K7400 Hepatic fibrosis, unspecified: Secondary | ICD-10-CM

## 2015-01-08 DIAGNOSIS — I10 Essential (primary) hypertension: Secondary | ICD-10-CM | POA: Diagnosis present

## 2015-01-08 DIAGNOSIS — F32A Depression, unspecified: Secondary | ICD-10-CM

## 2015-01-08 NOTE — Progress Notes (Signed)
Internal Medicine Clinic Attending  Case discussed with Dr. Krall soon after the resident saw the patient.  We reviewed the resident's history and exam and pertinent patient test results.  I agree with the assessment, diagnosis, and plan of care documented in the resident's note. 

## 2015-01-08 NOTE — Patient Instructions (Signed)
Things you can do for your fatigue:  Treatment may include:  Improving sleep with a regular bedtime routine.  Avoiding caffeine, alcohol, and tobacco.  Doing light exercise and stretching during the day.  Learning and practicing relaxation techniques.  Using memory aids or doing brain teasers to improve memory and concentration.  Seeing a mental health professional to evaluate and treat depression, if necessary.  Trying massage therapy, acupuncture, and movement exercises, like yoga or tai chi.

## 2015-01-08 NOTE — Progress Notes (Signed)
Subjective:   [name], female    DOB: [birth year], 54 y.o.   MRN: [id number]  HPI [name] is a 54 year old woman with history of HIV, treated hepatitis C, liver fibrosis, hypertension, GERD, adrenal insufficiency, prediabetes, chronic back pain presenting for follow-up.  She was last seen in clinic 12/04/2014. Her blood pressure was elevated at the time and her metoprolol was increased to 25 mg twice a day. She reports not tolerating citalopram that was started for her depression and has since discontinued this medication.  Review of Systems Constitutional: no fevers/chills Eyes: no vision changes Ears, nose, mouth, throat, and face: no cough Respiratory: no shortness of breath Cardiovascular: no chest pain Gastrointestinal: no nausea/vomiting, no abdominal pain, no constipation, no diarrhea Genitourinary: no dysuria, no hematuria Integument: no rash Hematologic/lymphatic: no bleeding/bruising, no edema Musculoskeletal: +chronic back pain Neurological: no paresthesias, no weakness  Past Medical History  Diagnosis Date  . HIV infection     1994  . Hepatitis C     genotype 1b, 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 s/p 12 week course of simeprevir and sofosbuvir between October 2014 and January 2015 with resolution.  . Hypertension   . GERD (gastroesophageal reflux disease)   . Depression   . Chronic back pain   . Hyperlipidemia   . History of shingles   . Refusal of blood transfusions as patient is Jehovah's Witness   . Prediabetes   . Allergic rhinitis 05/09/2006    Centricity Description: RHINITIS, ALLERGIC NOS Qualifier: Diagnosis of  By: Johnnye Sima MD, Dellis Filbert   Centricity Description: ALLERGIC RHINITIS Qualifier: Diagnosis of  By: Johnnye Sima MD, Dellis Filbert    . Pituitary microadenoma 08/08/2014  . Secondary adrenal insufficiency 06/29/2014    Current Outpatient Prescriptions on File Prior to Visit  Medication Sig Dispense Refill  . amLODipine  (NORVASC) 5 MG tablet TAKE 1 TABLET BY MOUTH ONCE DAILY (Patient taking differently: TAKE 5 MG BY MOUTH DAILY.) 90 tablet 1  . cetirizine (ZYRTEC) 10 MG tablet TAKE 1 TABLET BY MOUTH ONCE DAILY AS NEEDED FOR ALLERGIES (Patient taking differently: TAKE 10 MG BY MOUTH DAILY AS NEEDED FOR ALLERGIES) 90 tablet 3  . citalopram (CELEXA) 20 MG tablet Take 1 tablet (20 mg total) by mouth daily. (Patient not taking: Reported on 12/10/2014) 30 tablet 0  . darunavir-cobicistat (PREZCOBIX) 800-150 MG per tablet Take 1 tablet by mouth daily. Swallow whole. Do NOT crush, break or chew tablets. Take with food. 90 tablet 3  . diazepam (VALIUM) 5 MG tablet TAKE 1 TABLET BY MOUTH AT BEDTIME AS NEEDED 20 tablet 0  . diphenhydrAMINE (BENADRYL) 25 MG tablet TAKE 1-2 TABLETS BY MOUTH EVERY NIGHT AT BEDTIME AS NEEDED FOR ITCHING (Patient not taking: Reported on 12/10/2014) 20 tablet 1  . hydrocortisone (CORTEF) 10 MG tablet Take 10mg  at 8AM, 5mg  at 12PM. May take 5mg  at 4PM as needed (Patient taking differently: Take 10 mg by mouth 3 (three) times daily. Take 10mg  at 8AM, 5mg  at 12PM. May take 5mg  at [facility] as needed) 45 tablet 1  . methocarbamol (ROBAXIN) 500 MG tablet TAKE 1 TABLET BY MOUTH EVERY 8 HOURS AS NEEDED FOR MUSCLE SPASMS (Patient not taking: Reported on 12/10/2014) 90 tablet 3  . metoprolol tartrate (LOPRESSOR) 25 MG tablet Take 1 tablet (25 mg total) by mouth 2 (two) times daily. 60 tablet 3  . montelukast (SINGULAIR) 10 MG tablet TAKE 1 TABLET BY MOUTH EVERY DAY (Patient taking differently: TAKE 10  MG BY MOUTH DAILY) 90 tablet 1  . naproxen (NAPROSYN) 500 MG tablet Take 1 tablet (500 mg total) by mouth 2 (two) times daily as needed for moderate pain (Take with food). 60 tablet 11  . nitroGLYCERIN (NITROSTAT) 0.4 MG SL tablet Place 1 tablet (0.4 mg total) under the tongue every 5 (five) minutes as needed for chest pain. 25 tablet 3  . Olopatadine HCl (PATADAY) 0.2 % SOLN Apply 1 drop to eye daily as needed. For  allergies     . pantoprazole (PROTONIX) 20 MG tablet TAKE 1 TABLET BY MOUTH TWICE DAILY 60 tablet 0  . PROAIR HFA 108 (90 BASE) MCG/ACT inhaler INHALE 1-2 PUFFS BY MOUTH INTO THE LUNGS EVERY 6 HOURS AS NEEDED (Patient taking differently: INHALE 1-2 PUFFS BY MOUTH INTO THE LUNGS EVERY 6 HOURS AS NEEDED FOR SHORTNESS OF BREATH) 8.5 g 1  . promethazine (PHENERGAN) 50 MG tablet TAKE 1/2 TABLET BY MOUTH EVERY 6 HOURS AS NEEDED FOR NAUSEA OR VOMITING (Patient taking differently: TAKE 25 MG BY MOUTH EVERY 6 HOURS AS NEEDED FOR NAUSEA OR VOMITING.) 1 tablet 1  . traMADol (ULTRAM) 50 MG tablet TAKE 2 TABLETS BY MOUTH EVERY 12 HOURS AS NEEDED FOR PAIN ; MUST LAST 30 DAYS (Patient taking differently: Take 100 mg by mouth every 12 (twelve) hours as needed for moderate pain or severe pain. ) 60 tablet 0  . TRUVADA 200-300 MG per tablet TAKE 1 TABLET BY MOUTH EVERY DAY 30 tablet 10  . Vitamin D, Cholecalciferol, 1000 UNITS CAPS Take 1,000 mg by mouth daily. 90 capsule 1  . [DISCONTINUED] hydrochlorothiazide (HYDRODIURIL) 25 MG tablet Take 25 mg by mouth daily.     No current facility-administered medications on file prior to visit.   Today's Vitals   01/08/15 1507  BP: 123/74  Pulse: 80  Temp: 97.8 F (36.6 C)  TempSrc: Oral  Height: 5\' 1"  (1.549 m)  Weight: 166 lb 1.6 oz (75.342 kg)  SpO2: 99%  PainSc: 8   PainLoc: Shoulder    Objective:  Physical Exam  Constitutional: She is oriented to person, place, and time. She appears well-developed and well-nourished. No distress.  HENT:  Head: Normocephalic and atraumatic.  Eyes: EOM are normal.  Cardiovascular: Normal rate, regular rhythm, normal heart sounds and intact distal pulses.   Pulmonary/Chest: Effort normal and breath sounds normal. No respiratory distress. She has no wheezes.  Abdominal: Soft. Bowel sounds are normal. She exhibits no distension. There is no tenderness.  Musculoskeletal: She exhibits no edema.  Neurological: She is alert and  oriented to person, place, and time.   Assessment & Plan:  Please refer to problem based charting.

## 2015-01-09 ENCOUNTER — Telehealth: Payer: Self-pay | Admitting: Pulmonary Disease

## 2015-01-09 DIAGNOSIS — N179 Acute kidney failure, unspecified: Secondary | ICD-10-CM | POA: Insufficient documentation

## 2015-01-09 LAB — BMP8+ANION GAP
ANION GAP: 18 mmol/L (ref 10.0–18.0)
BUN / CREAT RATIO: 10 (ref 9–23)
BUN: 11 mg/dL (ref 6–24)
CO2: 24 mmol/L (ref 18–29)
CREATININE: 1.14 mg/dL — AB (ref 0.57–1.00)
Calcium: 9.5 mg/dL (ref 8.7–10.2)
Chloride: 99 mmol/L (ref 97–108)
GFR, EST AFRICAN AMERICAN: 63 mL/min/{1.73_m2} (ref >59)
GFR, EST NON AFRICAN AMERICAN: 55 mL/min/{1.73_m2} — AB (ref >59)
Glucose: 135 mg/dL — ABNORMAL HIGH (ref 65–99)
POTASSIUM: 3.6 mmol/L (ref 3.5–5.2)
SODIUM: 141 mmol/L (ref 134–144)

## 2015-01-09 NOTE — Assessment & Plan Note (Signed)
BMP Latest Ref Rng 01/08/2015 12/10/2014 08/18/2014  Glucose 65 - 99 mg/dL 135(H) 102(H) 132(H)  BUN 6 - 24 mg/dL 11 10 11   Creatinine 0.57 - 1.00 mg/dL 1.14(H) 0.71 0.76  BUN/Creat Ratio 9 - 23 10 - -  Sodium 134 - 144 mmol/L 141 139 140  Potassium 3.5 - 5.2 mmol/L 3.6 3.5 3.8  Chloride 97 - 108 mmol/L 99 104 105  CO2 18 - 29 mmol/L 24 25 25   Calcium 8.7 - 10.2 mg/dL 9.5 9.4 9.5   No metabolic acidosis. Potassium remains within normal limits off of supplementation.

## 2015-01-09 NOTE — Telephone Encounter (Signed)
Noted creatinine increased from baseline on labs drawn 01/08/2015.  BMP Latest Ref Rng 01/08/2015 12/10/2014 08/18/2014  Glucose 65 - 99 mg/dL 135(H) 102(H) 132(H)  BUN 6 - 24 mg/dL 11 10 11   Creatinine 0.57 - 1.00 mg/dL 1.14(H) 0.71 0.76  BUN/Creat Ratio 9 - 23 10 - -  Sodium 134 - 144 mmol/L 141 139 140  Potassium 3.5 - 5.2 mmol/L 3.6 3.5 3.8  Chloride 97 - 108 mmol/L 99 104 105  CO2 18 - 29 mmol/L 24 25 25   Calcium 8.7 - 10.2 mg/dL 9.5 9.4 9.5   Spoke to patient on phone who says she has been eating/drinking less because she was worried about gaining weight on the steroids.   Acute renal failure likely from volume depletion from decreased PO intake.  Plan: -I asked her to increase her fluid intake and hold naproxen. -Will have her be seen in follow up early next week to have her labs rechecked.

## 2015-01-09 NOTE — Assessment & Plan Note (Signed)
Did not tolerate citalopram as it made her anxious. She was told that this level of energy may be her new baseline given her chronic medical comorbidities and was accepting of this. She is still not interested in behavioral health as she does not think that her fatigue and insomnia are related to depression.  Plan:  -Discontinue citalopram. -Encouraged patient to increase physical activity.

## 2015-01-09 NOTE — Assessment & Plan Note (Signed)
Chronic hepatitis C, genotype 1b, stage 2 of fibrosis in liver biopsy performed in December 2013 s/p 12 week course of simeprevir and sofosbuvir between October 2014 and January 2015 with resolution of hepatitis C. Recommended by Dr. Lolita Lenz for liver monitoring with annual liver ultrasound.  Plan: -Liver U/S ordered.

## 2015-01-09 NOTE — Assessment & Plan Note (Signed)
See telephone note. Creatinine increased to 1.14 from baseline 0.7. Likely prerenal from volume depletion in setting of decreased PO intake.   BMET    Component Value Date/Time   NA 141 01/08/2015 1547   NA 139 12/10/2014 1640   K 3.6 01/08/2015 1547   CL 99 01/08/2015 1547   CO2 24 01/08/2015 1547   GLUCOSE 135* 01/08/2015 1547   GLUCOSE 102* 12/10/2014 1640   BUN 11 01/08/2015 1547   BUN 10 12/10/2014 1640   CREATININE 1.14* 01/08/2015 1547   CREATININE 0.59 06/26/2014 1615   CALCIUM 9.5 01/08/2015 1547   GFRNONAA 55* 01/08/2015 1547   GFRNONAA >89 06/26/2014 1615   GFRAA 63 01/08/2015 1547   GFRAA >89 06/26/2014 1615   Plan: -Increase PO fluids -Hold naproxen -Recheck BMP early next week.

## 2015-01-09 NOTE — Assessment & Plan Note (Signed)
BP Readings from Last 3 Encounters:  01/08/15 123/74  12/10/14 159/91  12/04/14 160/80    Lab Results  Component Value Date   NA 141 01/08/2015   K 3.6 01/08/2015   CREATININE 1.14* 01/08/2015    Assessment: Blood pressure control: controlled Progress toward BP goal:  at goal  Plan: Medications:  Continue metoprolol 25 mg twice a day and amlodipine 5 mg daily.

## 2015-01-12 ENCOUNTER — Other Ambulatory Visit: Payer: Medicaid Other

## 2015-01-12 DIAGNOSIS — B20 Human immunodeficiency virus [HIV] disease: Secondary | ICD-10-CM

## 2015-01-13 LAB — T-HELPER CELL (CD4) - (RCID CLINIC ONLY)
CD4 T CELL HELPER: 23 % — AB (ref 33–55)
CD4 T Cell Abs: 770 /uL (ref 400–2700)

## 2015-01-13 LAB — HIV-1 RNA QUANT-NO REFLEX-BLD
HIV 1 RNA Quant: <20 copies/mL (ref <20)
HIV-1 RNA Quant, Log: <1.3 {Log} (ref <1.30)

## 2015-01-14 ENCOUNTER — Ambulatory Visit: Payer: Medicaid Other | Admitting: Internal Medicine

## 2015-01-15 ENCOUNTER — Encounter: Payer: Self-pay | Admitting: Internal Medicine

## 2015-01-15 ENCOUNTER — Ambulatory Visit (INDEPENDENT_AMBULATORY_CARE_PROVIDER_SITE_OTHER): Payer: Medicaid Other | Admitting: Internal Medicine

## 2015-01-15 VITALS — BP 144/87 | HR 79 | Temp 98.0°F | Ht 61.0 in | Wt 167.3 lb

## 2015-01-15 DIAGNOSIS — N179 Acute kidney failure, unspecified: Secondary | ICD-10-CM

## 2015-01-15 NOTE — Assessment & Plan Note (Addendum)
BMET last week revealed acute renal failure with creatinine increase of 0.7 to 1.14. This was attributed to pre-renal causes due to decreased po intake as patient was watching her food and fluid intake given concern of weight gain while on steroids for secondary adrenal insufficiency. Plans were made to have patient follow up and have BMET rechecked. Patient states she has increased her intake of fluids, mostly water.  Plan: -BMET -FeNa

## 2015-01-15 NOTE — Patient Instructions (Signed)
Continue to stay active and remain well hydrated.

## 2015-01-15 NOTE — Progress Notes (Signed)
Subjective:    [name], female    DOB: 04/13/1961, 54 y.o.   MRN: [id number]  HPI [name] [name] is a 55 y.o. female with PMHx of HIV, Hepatitis C, HTN, GERD, and secondary adrenal insufficiency who presents to the clinic for follow up for ARF. Please see A&P for the status of the patient's chronic medical problems.   Past Medical History  Diagnosis Date  . HIV infection     1994  . Hepatitis C     genotype 1b, 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 s/p 12 week course of simeprevir and sofosbuvir between October 2014 and January 2015 with resolution.  . Hypertension   . GERD (gastroesophageal reflux disease)   . Depression   . Chronic back pain   . Hyperlipidemia   . History of shingles   . Refusal of blood transfusions as patient is Jehovah's Witness   . Prediabetes   . Allergic rhinitis 05/09/2006    Centricity Description: RHINITIS, ALLERGIC NOS Qualifier: Diagnosis of  By: Johnnye Sima MD, Dellis Filbert   Centricity Description: ALLERGIC RHINITIS Qualifier: Diagnosis of  By: Johnnye Sima MD, Dellis Filbert    . Pituitary microadenoma 08/08/2014  . Secondary adrenal insufficiency 06/29/2014    Outpatient Encounter Prescriptions as of 01/15/2015  Medication Sig Note  . amLODipine (NORVASC) 5 MG tablet TAKE 1 TABLET BY MOUTH ONCE DAILY (Patient taking differently: TAKE 5 MG BY MOUTH DAILY.)   . cetirizine (ZYRTEC) 10 MG tablet TAKE 1 TABLET BY MOUTH ONCE DAILY AS NEEDED FOR ALLERGIES (Patient taking differently: TAKE 10 MG BY MOUTH DAILY AS NEEDED FOR ALLERGIES)   . darunavir-cobicistat (PREZCOBIX) 800-150 MG per tablet Take 1 tablet by mouth daily. Swallow whole. Do NOT crush, break or chew tablets. Take with food.   . diazepam (VALIUM) 5 MG tablet TAKE 1 TABLET BY MOUTH AT BEDTIME AS NEEDED   . diphenhydrAMINE (BENADRYL) 25 MG tablet TAKE 1-2 TABLETS BY MOUTH EVERY NIGHT AT BEDTIME AS NEEDED FOR ITCHING   . hydrocortisone (CORTEF) 10 MG tablet Take 10mg  at 8AM, 5mg  at 12PM. May  take 5mg  at [facility] as needed (Patient taking differently: Take 10 mg by mouth 3 (three) times daily. Take 10mg  at 8AM, 5mg  at 12PM. May take 5mg  at [facility] as needed)   . metoprolol tartrate (LOPRESSOR) 25 MG tablet Take 1 tablet (25 mg total) by mouth 2 (two) times daily.   . montelukast (SINGULAIR) 10 MG tablet TAKE 1 TABLET BY MOUTH EVERY DAY (Patient taking differently: TAKE 10 MG BY MOUTH DAILY)   . naproxen (NAPROSYN) 500 MG tablet Take 1 tablet (500 mg total) by mouth 2 (two) times daily as needed for moderate pain (Take with food).   . nitroGLYCERIN (NITROSTAT) 0.4 MG SL tablet Place 1 tablet (0.4 mg total) under the tongue every 5 (five) minutes as needed for chest pain. 12/10/2014: Pt has not had to use recently   . Olopatadine HCl (PATADAY) 0.2 % SOLN Apply 1 drop to eye daily as needed. For allergies    . pantoprazole (PROTONIX) 20 MG tablet TAKE 1 TABLET BY MOUTH TWICE DAILY   . PROAIR HFA 108 (90 BASE) MCG/ACT inhaler INHALE 1-2 PUFFS BY MOUTH INTO THE LUNGS EVERY 6 HOURS AS NEEDED (Patient taking differently: INHALE 1-2 PUFFS BY MOUTH INTO THE LUNGS EVERY 6 HOURS AS NEEDED FOR SHORTNESS OF BREATH)   . promethazine (PHENERGAN) 50 MG tablet TAKE 1/2 TABLET BY MOUTH EVERY 6 HOURS AS NEEDED FOR NAUSEA  OR VOMITING (Patient taking differently: TAKE 25 MG BY MOUTH EVERY 6 HOURS AS NEEDED FOR NAUSEA OR VOMITING.)   . traMADol (ULTRAM) 50 MG tablet TAKE 2 TABLETS BY MOUTH EVERY 12 HOURS AS NEEDED FOR PAIN ; MUST LAST 30 DAYS (Patient taking differently: Take 100 mg by mouth every 12 (twelve) hours as needed for moderate pain or severe pain. )   . TRUVADA 200-300 MG per tablet TAKE 1 TABLET BY MOUTH EVERY DAY   . Vitamin D, Cholecalciferol, 1000 UNITS CAPS Take 1,000 mg by mouth daily.    No facility-administered encounter medications on file as of 01/15/2015.    Family History  Problem Relation Age of Onset  . Heart disease Mother   . Diabetes Mother   . Stroke Mother   . Heart disease Father   .  Stroke Father   . Diabetes Father   . Hepatitis Sister     hcv  . Stroke Other   . Colon polyps Brother   . Cancer Sister     lung    Social History   Social History  . Marital Status: Single    Spouse Name: N/A  . Number of Children: 0  . Years of Education: N/A   Occupational History  . Not on file.   Social History Main Topics  . Smoking status: Former Smoker    Types: Cigarettes    Quit date: 04/26/2007  . Smokeless tobacco: Never Used     Comment: QUIT 2009  . Alcohol Use: No  . Drug Use: No  . Sexual Activity:    Partners: Male     Comment: pt. given condoms   Other Topics Concern  . Not on file   Social History Narrative   Alternate # [phone]    Review of Systems General: Admits to fatigue and weight gain. Denies fever, chills, and diaphoresis.  Respiratory: Denies SOB, cough Cardiovascular: Denies chest pain and palpitations.  Gastrointestinal: Denies nausea, vomiting, abdominal pain Skin: Denies pallor, rash and wounds.  Neurological: Denies dizziness, headaches, weakness, lightheadedness     Objective:   Physical Exam Filed Vitals:   01/15/15 1324  BP: 144/87  Pulse: 79  Temp: 98 F (36.7 C)  TempSrc: Oral  Height: 5\' 1"  (1.549 m)  Weight: 167 lb 4.8 oz (75.887 kg)  SpO2: 99%   General: Vital signs reviewed.  Patient is well-developed and well-nourished, in no acute distress and cooperative with exam.  Neck: +buffalo hump. Supple, trachea midline, normal ROM, no JVD, masses, thyromegaly, or carotid bruit present.  Cardiovascular: RRR, S1 normal, S2 normal, no murmurs, gallops, or rubs. Pulmonary/Chest: Clear to auscultation bilaterally, no wheezes, rales, or rhonchi. Abdominal: Soft, non-tender, non-distended, BS + Extremities: No lower extremity edema bilaterally, pulses symmetric and intact bilaterally. Skin: Warm, dry and intact. No rashes or erythema. Psychiatric: Normal mood and affect. speech and behavior is normal. Cognition  and memory are normal.      Assessment & Plan:   Please see problem oriented assessment and plan for more information.

## 2015-01-16 ENCOUNTER — Telehealth: Payer: Self-pay | Admitting: Internal Medicine

## 2015-01-16 LAB — BMP8+ANION GAP
Anion Gap: 19 mmol/L — ABNORMAL HIGH (ref 10.0–18.0)
BUN / CREAT RATIO: 15 (ref 9–23)
BUN: 11 mg/dL (ref 6–24)
CHLORIDE: 99 mmol/L (ref 97–108)
CO2: 22 mmol/L (ref 18–29)
Calcium: 9.8 mg/dL (ref 8.7–10.2)
Creatinine, Ser: 0.72 mg/dL (ref 0.57–1.00)
GFR calc Af Amer: 111 mL/min/{1.73_m2} (ref >59)
GFR calc non Af Amer: 96 mL/min/{1.73_m2} (ref >59)
GLUCOSE: 90 mg/dL (ref 65–99)
Potassium: 4.1 mmol/L (ref 3.5–5.2)
SODIUM: 140 mmol/L (ref 134–144)

## 2015-01-16 LAB — CREATININE, URINE, RANDOM: Creatinine, Urine: 91.6 mg/dL

## 2015-01-16 LAB — SODIUM, URINE, RANDOM: SODIUM UR: 77 mmol/L

## 2015-01-16 NOTE — Telephone Encounter (Signed)
Left message with report of normal lab results. Patient can call back if she would like to.

## 2015-01-16 NOTE — Progress Notes (Signed)
Internal Medicine Clinic Attending  Case discussed with Dr. Richardson at the time of the visit.  We reviewed the resident's history and exam and pertinent patient test results.  I agree with the assessment, diagnosis, and plan of care documented in the resident's note. 

## 2015-01-19 ENCOUNTER — Ambulatory Visit (HOSPITAL_COMMUNITY)
Admission: RE | Admit: 2015-01-19 | Discharge: 2015-01-19 | Disposition: A | Discharge status: Home / Self Care | Payer: Medicaid Other | Source: Ambulatory Visit | Attending: Student in an Organized Health Care Education/Training Program | Admitting: Student in an Organized Health Care Education/Training Program

## 2015-01-19 DIAGNOSIS — K7400 Hepatic fibrosis, unspecified: Secondary | ICD-10-CM

## 2015-01-19 DIAGNOSIS — B20 Human immunodeficiency virus [HIV] disease: Secondary | ICD-10-CM | POA: Insufficient documentation

## 2015-01-19 DIAGNOSIS — K7689 Other specified diseases of liver: Secondary | ICD-10-CM | POA: Diagnosis not present

## 2015-01-19 DIAGNOSIS — B1920 Unspecified viral hepatitis C without hepatic coma: Secondary | ICD-10-CM | POA: Diagnosis not present

## 2015-01-21 ENCOUNTER — Other Ambulatory Visit: Payer: Self-pay | Admitting: Infectious Diseases

## 2015-01-26 ENCOUNTER — Encounter: Payer: Self-pay | Admitting: Infectious Diseases

## 2015-01-26 ENCOUNTER — Ambulatory Visit (INDEPENDENT_AMBULATORY_CARE_PROVIDER_SITE_OTHER): Payer: Medicaid Other | Admitting: Infectious Diseases

## 2015-01-26 VITALS — BP 133/84 | HR 84 | Temp 97.9°F | Wt 169.0 lb

## 2015-01-26 DIAGNOSIS — M545 Low back pain: Secondary | ICD-10-CM

## 2015-01-26 DIAGNOSIS — M958 Other specified acquired deformities of musculoskeletal system: Secondary | ICD-10-CM | POA: Diagnosis not present

## 2015-01-26 DIAGNOSIS — B20 Human immunodeficiency virus [HIV] disease: Secondary | ICD-10-CM | POA: Diagnosis not present

## 2015-01-26 DIAGNOSIS — G8929 Other chronic pain: Secondary | ICD-10-CM

## 2015-01-26 DIAGNOSIS — R5382 Chronic fatigue, unspecified: Secondary | ICD-10-CM

## 2015-01-26 DIAGNOSIS — IMO0002 Reserved for concepts with insufficient information to code with codable children: Secondary | ICD-10-CM

## 2015-01-26 NOTE — Addendum Note (Signed)
Addended by: Asaiah Hunnicutt C on: 01/26/2015 02:43 PM   Modules accepted: Orders

## 2015-01-26 NOTE — Assessment & Plan Note (Signed)
Appreciate PMR f/u.

## 2015-01-26 NOTE — Assessment & Plan Note (Signed)
She's doing very well.  Offered/refused condoms.  Will continue her current meds, hopeful that cobi is not causing her addison's (in an online search it does not) Has gotten flu shot Will see her back in 6 months.

## 2015-01-26 NOTE — Progress Notes (Signed)
   Subjective:    [name], female    DOB: 1960-05-24, 54 y.o.   MRN: [id number]  HPI 54 yo F with hx of Hep C (tx at [facility] completed 04-2013, repeat Hep C RNA -) and HIV+ (prev LXVr/TRV). She had nasal septoplasty 01-05-10. She was previously changed to [place] in 2013. She was then changed to TRV/DTGV in anticipation of starting Hep C treatment.  She developed a rash on this and was then changed to ISN/TRV (which she takes with hydroxizine due to pruritis, rash).  She was changed to ATVc/TRV which her rash persisted on, then was taken off ART. Was seen December 2015 and restarted ART due to low CD4. Now on DRVc-TRV.  Was hospitalized in April and found to have pituitary adenoma (being watched), addison's disease, borderline DM.  Feels fatigued all the time. Expects that this is from DM, HIV, addison's dx.  Is going to PMR for back pain re-eval to exclude steroids.  Has been changed to whole pill of lopressor (which can also make her fatigued). Is able to do ADLs.  Has gained wt- ~19 #.  Has been walking some. Exacerbates her fatigue.  She had repeat liver u/s that was unchanged.  Knows that she needs to get mammo/pap.   HIV 1 RNA QUANT (copies/mL)  Date Value  01/12/2015 <20  10/23/2014 21  05/26/2014 110*   CD4 T CELL ABS (/uL)  Date Value  01/12/2015 770  10/23/2014 520  05/26/2014 400    Review of Systems  Constitutional: Positive for fatigue and unexpected weight change. Negative for appetite change.  Respiratory: Negative for shortness of breath.   Cardiovascular: Positive for chest pain.  Gastrointestinal: Negative for diarrhea and constipation.  Genitourinary: Positive for frequency. Negative for dysuria and hematuria.  exertional CP, has not taken NGT recently.      Objective:   Physical Exam  Constitutional: She appears well-developed and well-nourished.  HENT:  Mouth/Throat: No oropharyngeal exudate.  Eyes: EOM are normal. Pupils are equal, round,  and reactive to light.  Neck: Neck supple.  Cardiovascular: Normal rate, regular rhythm and normal heart sounds.   Pulmonary/Chest: Effort normal and breath sounds normal.  Abdominal: Soft. Bowel sounds are normal. There is no tenderness. There is no rebound.    Lymphadenopathy:    She has no cervical adenopathy.      Assessment & Plan:

## 2015-01-26 NOTE — Assessment & Plan Note (Signed)
She has multiple possible causes of this- beta-blocker, steroids, elevated Glc, HIV.  appreciate PCP f/u.

## 2015-01-27 ENCOUNTER — Telehealth: Payer: Self-pay | Admitting: *Deleted

## 2015-01-27 ENCOUNTER — Other Ambulatory Visit: Payer: Self-pay | Admitting: Pulmonary Disease

## 2015-01-27 ENCOUNTER — Inpatient Hospital Stay (HOSPITAL_COMMUNITY)
Admission: AD | Admit: 2015-01-27 | Discharge: 2015-01-29 | DRG: 152 | Disposition: A | Discharge status: Home / Self Care | Payer: Medicaid Other | Source: Ambulatory Visit | Attending: Internal Medicine | Admitting: Internal Medicine

## 2015-01-27 ENCOUNTER — Encounter: Payer: Self-pay | Admitting: Internal Medicine

## 2015-01-27 ENCOUNTER — Ambulatory Visit (INDEPENDENT_AMBULATORY_CARE_PROVIDER_SITE_OTHER): Payer: Medicaid Other | Admitting: Internal Medicine

## 2015-01-27 ENCOUNTER — Observation Stay (HOSPITAL_COMMUNITY): Payer: Medicaid Other

## 2015-01-27 VITALS — Temp 98.8°F | Ht 61.0 in | Wt 160.5 lb

## 2015-01-27 DIAGNOSIS — J069 Acute upper respiratory infection, unspecified: Secondary | ICD-10-CM

## 2015-01-27 DIAGNOSIS — M549 Dorsalgia, unspecified: Secondary | ICD-10-CM | POA: Diagnosis present

## 2015-01-27 DIAGNOSIS — J029 Acute pharyngitis, unspecified: Principal | ICD-10-CM | POA: Diagnosis present

## 2015-01-27 DIAGNOSIS — Z7952 Long term (current) use of systemic steroids: Secondary | ICD-10-CM

## 2015-01-27 DIAGNOSIS — R42 Dizziness and giddiness: Secondary | ICD-10-CM

## 2015-01-27 DIAGNOSIS — E2749 Other adrenocortical insufficiency: Secondary | ICD-10-CM | POA: Diagnosis present

## 2015-01-27 DIAGNOSIS — I951 Orthostatic hypotension: Secondary | ICD-10-CM | POA: Diagnosis present

## 2015-01-27 DIAGNOSIS — F329 Major depressive disorder, single episode, unspecified: Secondary | ICD-10-CM | POA: Diagnosis present

## 2015-01-27 DIAGNOSIS — K219 Gastro-esophageal reflux disease without esophagitis: Secondary | ICD-10-CM | POA: Diagnosis present

## 2015-01-27 DIAGNOSIS — B1920 Unspecified viral hepatitis C without hepatic coma: Secondary | ICD-10-CM | POA: Diagnosis present

## 2015-01-27 DIAGNOSIS — R058 Other specified cough: Secondary | ICD-10-CM | POA: Insufficient documentation

## 2015-01-27 DIAGNOSIS — R05 Cough: Secondary | ICD-10-CM | POA: Insufficient documentation

## 2015-01-27 DIAGNOSIS — B20 Human immunodeficiency virus [HIV] disease: Secondary | ICD-10-CM | POA: Diagnosis present

## 2015-01-27 DIAGNOSIS — E872 Acidosis: Secondary | ICD-10-CM | POA: Diagnosis present

## 2015-01-27 DIAGNOSIS — Z87891 Personal history of nicotine dependence: Secondary | ICD-10-CM

## 2015-01-27 DIAGNOSIS — I10 Essential (primary) hypertension: Secondary | ICD-10-CM | POA: Diagnosis not present

## 2015-01-27 DIAGNOSIS — Z79899 Other long term (current) drug therapy: Secondary | ICD-10-CM

## 2015-01-27 DIAGNOSIS — E876 Hypokalemia: Secondary | ICD-10-CM | POA: Diagnosis not present

## 2015-01-27 DIAGNOSIS — E785 Hyperlipidemia, unspecified: Secondary | ICD-10-CM | POA: Diagnosis present

## 2015-01-27 LAB — COMPREHENSIVE METABOLIC PANEL
ALBUMIN: 3.5 g/dL (ref 3.5–5.0)
ALT: 12 U/L — AB (ref 14–54)
AST: 17 U/L (ref 15–41)
Alkaline Phosphatase: 92 U/L (ref 38–126)
Anion gap: 10 (ref 5–15)
BILIRUBIN TOTAL: 0.7 mg/dL (ref 0.3–1.2)
BUN: 7 mg/dL (ref 6–20)
CHLORIDE: 100 mmol/L — AB (ref 101–111)
CO2: 25 mmol/L (ref 22–32)
CREATININE: 0.82 mg/dL (ref 0.44–1.00)
Calcium: 9 mg/dL (ref 8.9–10.3)
GFR calc Af Amer: >60 mL/min (ref >60)
GLUCOSE: 108 mg/dL — AB (ref 65–99)
Potassium: 3.2 mmol/L — ABNORMAL LOW (ref 3.5–5.1)
Sodium: 135 mmol/L (ref 135–145)
TOTAL PROTEIN: 7.8 g/dL (ref 6.5–8.1)

## 2015-01-27 LAB — CBC WITH DIFFERENTIAL/PLATELET
BASOS ABS: 0 10*3/uL (ref 0.0–0.1)
Basophils Relative: 0 %
Eosinophils Absolute: 0.1 10*3/uL (ref 0.0–0.7)
Eosinophils Relative: 0 %
HEMATOCRIT: 38.4 % (ref 36.0–46.0)
HEMOGLOBIN: 12.7 g/dL (ref 12.0–15.0)
LYMPHS PCT: 18 %
Lymphs Abs: 2.6 10*3/uL (ref 0.7–4.0)
MCH: 26.3 pg (ref 26.0–34.0)
MCHC: 33.1 g/dL (ref 30.0–36.0)
MCV: 79.7 fL (ref 78.0–100.0)
Monocytes Absolute: 0.9 10*3/uL (ref 0.1–1.0)
Monocytes Relative: 6 %
NEUTROS ABS: 11 10*3/uL — AB (ref 1.7–7.7)
NEUTROS PCT: 76 %
Platelets: 236 10*3/uL (ref 150–400)
RBC: 4.82 MIL/uL (ref 3.87–5.11)
RDW: 15.7 % — ABNORMAL HIGH (ref 11.5–15.5)
WBC: 14.5 10*3/uL — AB (ref 4.0–10.5)

## 2015-01-27 LAB — LACTIC ACID, PLASMA
LACTIC ACID, VENOUS: 2.6 mmol/L — AB (ref 0.5–2.0)
LACTIC ACID, VENOUS: 2.3 mmol/L — AB (ref 0.5–2.0)

## 2015-01-27 MED ORDER — PANTOPRAZOLE SODIUM 20 MG PO TBEC
20.0000 mg | DELAYED_RELEASE_TABLET | Freq: Two times a day (BID) | ORAL | Status: DC
  Administered 2015-01-27 – 2015-01-29 (×4): 20 mg via ORAL
  Filled 2015-01-27 (×4): qty 1

## 2015-01-27 MED ORDER — NAPROXEN 250 MG PO TABS: 500.0000 mg | ORAL_TABLET | Freq: Two times a day (BID) | ORAL | Status: DC | PRN

## 2015-01-27 MED ORDER — HYDROCORTISONE 10 MG PO TABS
10.0000 mg | ORAL_TABLET | Freq: Two times a day (BID) | ORAL | Status: DC
  Administered 2015-01-27 – 2015-01-29 (×4): 10 mg via ORAL
  Filled 2015-01-27 (×5): qty 1

## 2015-01-27 MED ORDER — GUAIFENESIN-DM 100-10 MG/5ML PO SYRP
5.0000 mL | ORAL_SOLUTION | ORAL | Status: DC | PRN
  Administered 2015-01-28 – 2015-01-29 (×2): 5 mL via ORAL
  Filled 2015-01-27 (×2): qty 5

## 2015-01-27 MED ORDER — TRAMADOL HCL 50 MG PO TABS
50.0000 mg | ORAL_TABLET | Freq: Four times a day (QID) | ORAL | Status: DC | PRN
  Administered 2015-01-27 – 2015-01-29 (×5): 50 mg via ORAL
  Filled 2015-01-27 (×5): qty 1

## 2015-01-27 MED ORDER — POTASSIUM CHLORIDE IN NACL 40-0.9 MEQ/L-% IV SOLN
INTRAVENOUS | Status: DC
  Administered 2015-01-27: 125 mL/h via INTRAVENOUS
  Administered 2015-01-28 (×2): 150 mL/h via INTRAVENOUS
  Filled 2015-01-27 (×7): qty 1000

## 2015-01-27 MED ORDER — NITROGLYCERIN 0.4 MG SL SUBL: 0.4000 mg | SUBLINGUAL_TABLET | SUBLINGUAL | Status: DC | PRN

## 2015-01-27 MED ORDER — ENOXAPARIN SODIUM 40 MG/0.4ML ~~LOC~~ SOLN
40.0000 mg | SUBCUTANEOUS | Status: DC
  Administered 2015-01-27 – 2015-01-28 (×2): 40 mg via SUBCUTANEOUS
  Filled 2015-01-27 (×2): qty 0.4

## 2015-01-27 MED ORDER — OSELTAMIVIR PHOSPHATE 75 MG PO CAPS
75.0000 mg | ORAL_CAPSULE | Freq: Two times a day (BID) | ORAL | Status: DC
Start: 2015-01-27 — End: 2015-01-28
  Administered 2015-01-27: 75 mg via ORAL
  Filled 2015-01-27 (×3): qty 1

## 2015-01-27 MED ORDER — EMTRICITABINE-TENOFOVIR AF 200-25 MG PO TABS
1.0000 | ORAL_TABLET | Freq: Every day | ORAL | Status: DC
  Administered 2015-01-27 – 2015-01-28 (×2): 1 via ORAL
  Filled 2015-01-27 (×4): qty 1

## 2015-01-27 MED ORDER — LORATADINE 10 MG PO TABS
10.0000 mg | ORAL_TABLET | Freq: Every day | ORAL | Status: DC
  Administered 2015-01-28 – 2015-01-29 (×2): 10 mg via ORAL
  Filled 2015-01-27 (×2): qty 1

## 2015-01-27 MED ORDER — HYDROCORTISONE 20 MG PO TABS
20.0000 mg | ORAL_TABLET | Freq: Every morning | ORAL | Status: DC
  Administered 2015-01-28 – 2015-01-29 (×2): 20 mg via ORAL
  Filled 2015-01-27 (×3): qty 1

## 2015-01-27 MED ORDER — DIAZEPAM 5 MG PO TABS
5.0000 mg | ORAL_TABLET | Freq: Every evening | ORAL | Status: DC | PRN
  Administered 2015-01-29: 5 mg via ORAL
  Filled 2015-01-27: qty 1

## 2015-01-27 MED ORDER — PROMETHAZINE HCL 25 MG PO TABS
25.0000 mg | ORAL_TABLET | Freq: Four times a day (QID) | ORAL | Status: DC | PRN
  Administered 2015-01-27 – 2015-01-29 (×4): 25 mg via ORAL
  Filled 2015-01-27 (×6): qty 1

## 2015-01-27 MED ORDER — DARUNAVIR-COBICISTAT 800-150 MG PO TABS
1.0000 | ORAL_TABLET | Freq: Every day | ORAL | Status: DC
  Administered 2015-01-27 – 2015-01-28 (×3): 1 via ORAL
  Filled 2015-01-27 (×5): qty 1

## 2015-01-27 NOTE — Progress Notes (Signed)
[name], female   DOB: 09-02-1960, 54 y.o.   MRN: [id number]   Subjective:   [name] female   DOB: 09/30/1960 54 y.o.   MRN: [id number]  HPI: [name] is a 54 y.o. with PMH listed below. Presented today with complaints of feeling ill- this started after her ID clinic visit yesterday, at about 4pm yesterday. She says she started with feeling dizzy and nausea, did not fall. At about 10pm dizziness got worse, with cold and chills, and achiness, with blurred vision- yesterday evening, she checked her temp yesterday - 100.2, she took advil last night for the headache, she is allergic to tylenol.. She also says he has been having a headache since this started. She says her whole body is sore and achy, she has a sore throat. No sick contact. She had the flu shot about 2 weeks. She is also having runny nose, and is SOB, with productive cough last night with slightly bloody sputum,also extremely fatigued and tired.  She feels she gets short winded when she tries to do some activity. No wheezing. She threw up last night, once, was yellow, but she thinks it had a little blood. No loose stools, has nausea. LAst meal was last night- some soup, but all day today she has been eating just crackers. She has poor appetite. And tummy pain all over. Last bowel movement was normal yesterday.  She called her endocrinologist - was told to double up on her steriods- when she is feeling ill, so she took 10 this afternoon.   Past Medical History  Diagnosis Date  . HIV infection ([place])     1994  . Hepatitis C     genotype 1b, 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 s/p 12 week course of simeprevir and sofosbuvir between October 2014 and January 2015 with resolution.  . Hypertension   . GERD (gastroesophageal reflux disease)   . Depression   . Chronic back pain   . Hyperlipidemia   . History of shingles   . Refusal of blood transfusions as patient is Jehovah's Witness   .  Prediabetes   . Allergic rhinitis 05/09/2006    Centricity Description: RHINITIS, ALLERGIC NOS Qualifier: Diagnosis of  By: Johnnye Sima MD, Dellis Filbert   Centricity Description: ALLERGIC RHINITIS Qualifier: Diagnosis of  By: Johnnye Sima MD, Dellis Filbert    . Pituitary microadenoma ([place]) 08/08/2014  . Secondary adrenal insufficiency ([place]) 06/29/2014   Current Outpatient Prescriptions  Medication Sig Dispense Refill  . amLODipine (NORVASC) 5 MG tablet TAKE 1 TABLET BY MOUTH ONCE DAILY (Patient taking differently: TAKE 5 MG BY MOUTH DAILY.) 90 tablet 1  . cetirizine (ZYRTEC) 10 MG tablet TAKE 1 TABLET BY MOUTH ONCE DAILY AS NEEDED FOR ALLERGIES (Patient taking differently: TAKE 10 MG BY MOUTH DAILY AS NEEDED FOR ALLERGIES) 90 tablet 3  . darunavir-cobicistat (PREZCOBIX) 800-150 MG per tablet Take 1 tablet by mouth daily. Swallow whole. Do NOT crush, break or chew tablets. Take with food. 90 tablet 3  . diazepam (VALIUM) 5 MG tablet TAKE 1 TABLET BY MOUTH AT BEDTIME AS NEEDED 20 tablet 0  . diphenhydrAMINE (BENADRYL) 25 MG tablet TAKE 1-2 TABLETS BY MOUTH EVERY NIGHT AT BEDTIME AS NEEDED FOR ITCHING 20 tablet 1  . hydrocortisone (CORTEF) 10 MG tablet Take 10mg  at 8AM, 5mg  at 12PM. May take 5mg  at 4PM as needed (Patient taking differently: Take 10 mg by mouth 3 (three) times daily. Take 10mg  at 8AM, 5mg   at 12PM. May take 5mg  at [facility] as needed) 45 tablet 1  . metoprolol tartrate (LOPRESSOR) 25 MG tablet Take 1 tablet (25 mg total) by mouth 2 (two) times daily. 60 tablet 3  . montelukast (SINGULAIR) 10 MG tablet TAKE 1 TABLET BY MOUTH EVERY DAY (Patient taking differently: TAKE 10 MG BY MOUTH DAILY) 90 tablet 1  . naproxen (NAPROSYN) 500 MG tablet Take 1 tablet (500 mg total) by mouth 2 (two) times daily as needed for moderate pain (Take with food). 60 tablet 11  . nitroGLYCERIN (NITROSTAT) 0.4 MG SL tablet Place 1 tablet (0.4 mg total) under the tongue every 5 (five) minutes as needed for chest pain. 25 tablet 3  .  Olopatadine HCl (PATADAY) 0.2 % SOLN Apply 1 drop to eye daily as needed. For allergies     . pantoprazole (PROTONIX) 20 MG tablet TAKE 1 TABLET BY MOUTH TWICE DAILY. 60 tablet 3  . PROAIR HFA 108 (90 BASE) MCG/ACT inhaler INHALE 1-2 PUFFS BY MOUTH INTO THE LUNGS EVERY 6 HOURS AS NEEDED (Patient taking differently: INHALE 1-2 PUFFS BY MOUTH INTO THE LUNGS EVERY 6 HOURS AS NEEDED FOR SHORTNESS OF BREATH) 8.5 g 1  . promethazine (PHENERGAN) 50 MG tablet TAKE 1/2 TABLET BY MOUTH EVERY 6 HOURS AS NEEDED FOR NAUSEA OR VOMITING (Patient taking differently: TAKE 25 MG BY MOUTH EVERY 6 HOURS AS NEEDED FOR NAUSEA OR VOMITING.) 1 tablet 1  . traMADol (ULTRAM) 50 MG tablet TAKE 2 TABLETS BY MOUTH EVERY 12 HOURS AS NEEDED FOR PAIN ; MUST LAST 30 DAYS (Patient taking differently: Take 100 mg by mouth every 12 (twelve) hours as needed for moderate pain or severe pain. ) 60 tablet 0  . TRUVADA 200-300 MG per tablet TAKE 1 TABLET BY MOUTH EVERY DAY 30 tablet 10  . Vitamin D, Cholecalciferol, 1000 UNITS CAPS Take 1,000 mg by mouth daily. 90 capsule 1  . [DISCONTINUED] hydrochlorothiazide (HYDRODIURIL) 25 MG tablet Take 25 mg by mouth daily.     No current facility-administered medications for this visit.   Family History  Problem Relation Age of Onset  . Heart disease Mother   . Diabetes Mother   . Stroke Mother   . Heart disease Father   . Stroke Father   . Diabetes Father   . Hepatitis Sister     hcv  . Stroke Other   . Colon polyps Brother   . Cancer Sister     lung   Social History   Social History  . Marital Status: Single    Spouse Name: N/A  . Number of Children: 0  . Years of Education: N/A   Social History Main Topics  . Smoking status: Former Smoker    Types: Cigarettes    Quit date: 04/26/2007  . Smokeless tobacco: Never Used     Comment: QUIT 2009  . Alcohol Use: No  . Drug Use: No  . Sexual Activity:    Partners: Male     Comment: pt. given condoms   Other Topics Concern  .  None   Social History Narrative   Alternate # [phone]   Review of Systems: CONSTITUTIONAL- No Fever, or change in appetite. SKIN- No Rash, colour changes or itching. HEAD- No Headache or dizziness. Mouth/throat- No Sorethroat, or bleeding gums. RESPIRATORY- No Cough or SOB. CARDIAC- No Palpitations, or chest pain. URINARY- No Frequency, or dysuria. NEUROLOGIC- No Numbness, syncope, seizures or burning. [facility]- Denies depression or anxiety.  Objective:  Physical Exam: Danley Danker  Vitals:   01/27/15 1503 01/27/15 1504  Temp:  98.8 F (37.1 C)  TempSrc:  Oral  Height: 5\' 1"  (1.549 m)   Weight: 160 lb 8 oz (72.802 kg)    GENERAL- alert, co-operative, appears as stated age, not in any distress, feels warm to touch. HEENT- Atraumatic, normocephalic, PERRL,  oral mucosa appears slightly dry, wearing dentures, no cervical LN enlargement, thyroid does not appear enlarged, neck supple. CARDIAC- RRR, no murmurs, rubs or gallops. RESP- Moving equal volumes of air, and clear to auscultation bilaterally, no wheezes or crackles. ABDOMEN- Soft, nontender, no guarding or rebound, no palpable masses or organomegaly, bowel sounds present, but still complains of generalized achiness. BACK- Normal curvature of the spine, no CVA tenderness, but has generalized body achiness. NEURO- No obvious Cr N abnormality, weak to stand. EXTREMITIES- pulse 2+, symmetric, no pedal edema. SKIN- Warm, dry, No rash or lesion. PSYCH- Normal mood and affect, appropriate thought content and speech, tearful.  Assessment & Plan:   The patient's case and plan of care was discussed with attending physician, Dr. Evette Doffing.  Dizziness- Pt dizzy, weak, with poor appetite, with cough, chills, sorethroat, SOB, and generalized body pains and headaches also with orthostatic hypotension. Likely viral etiology.  - Orthostatic vitals- Lying 156/ 85, standing- 135/84.  - Stat CBC- With elevated WBC - Stat  CMET- Low K  Plan- Check  CBC differential, lymphocytic will point more toward viral - Consider getting Chest xray-as she had fever, chills and SOB - Influenza PCR - Admit to IMTS- Observation.

## 2015-01-27 NOTE — Progress Notes (Signed)
CRITICAL VALUE ALERT  Critical value received:  Lactic acid 2.6  Date of notification:  01/27/15  Time of notification:  2119  Critical value read back:Yes.    Nurse who received alert:  Amaryllis Dyke  MD notified (1st page):  Internal med  Time of first page:  2120  MD notified (2nd page):  Time of second page:  Responding MD:  Melburn Hake, MD  Time MD responded:  2122

## 2015-01-27 NOTE — Telephone Encounter (Signed)
Pt called - since 01/26/15 afternoon - c/o of nausea, chills and ? Temp. Unable to check temperature. Appt made 01/27/15 2:15PM Dr Denton Brick. If any change before appt - needs to go to ER. [name] RN 01/27/15 9:45AM

## 2015-01-27 NOTE — H&P (Signed)
Date: 01/27/2015               Patient Name:  [name] [name] MRN: [id number]  DOB: December 25, 1960 Age / Sex: 54 y.o., female   PCP: Milagros Loll, MD         Medical Service: Internal Medicine Teaching Service         Attending Physician: Dr. Sid Falcon, MD    First Contact: Dr. Charlynn Grimes Pager: [phone]  Second Contact: Dr. Marvel Plan Pager: [phone]       After Hours (After 5p/  First Contact Pager: [phone]  weekends / holidays): Second Contact Pager: [phone]   Chief Complaint: Feeling ill  History of Present Illness: [name] [name] is a 54 year old female with a PMH of well controlled HIV (CD4 count on 9/19 770 and VL <20), Hepatitis C s/p treatment, HTN, secondary adrenal insufficiency presented [facility] this afternoon with complaints of feeling ill. She reports that she felt fine yesterday afternoon and went to her ID clinic appointment for routine follow up.  After leaving she began to feel dizzy and nauseated. Continued to get worse over the evening and developed chills, generalized body aches, blurred vision and headaches. She checked her temperature and was 100.2. She reports that she is SOB with pleuritic chest pain and productive cough with yellow blood streaked sputum. She had one episode of emesis with yellow and streaks of blood. Denies any diarrhea or runny nose. Decreased appetite. Reports sore throat.   She called her endocrinologist this morning who told her to double her dose of steroids for 3 days. She takes 10 mg in the morning, 5 mg afternoon and evening. She took her normal 10 mg dose this morning increased to 10 mg afternoon dose after speaking with her endocrinologist.  Orthostatics in [facility] positive for orthostatic hypotension.   Meds: Current Facility-Administered Medications  Medication Dose Route Frequency Provider Last Rate Last Dose  . 0.9 % NaCl with KCl 40 mEq / L  infusion   Intravenous Continuous Alexa Sherral Hammers, MD      .  darunavir-cobicistat (PREZCOBIX) 800-150 MG per tablet 1 tablet  1 tablet Oral Daily Alexa Sherral Hammers, MD      . diazepam (VALIUM) tablet 5 mg  5 mg Oral QHS PRN Alexa Sherral Hammers, MD      . emtricitabine-tenofovir AF (DESCOVY) 200-25 MG per tablet 1 tablet  1 tablet Oral Daily Alexa Sherral Hammers, MD      . enoxaparin (LOVENOX) injection 40 mg  40 mg Subcutaneous Q24H Alexa Sherral Hammers, MD      . guaiFENesin-dextromethorphan (ROBITUSSIN DM) 100-10 MG/5ML syrup 5 mL  5 mL Oral Q4H PRN Alexa Sherral Hammers, MD      . hydrocortisone (CORTEF) tablet 10 mg  10 mg Oral BID Alexa Sherral Hammers, MD      . Derrill Memo ON 01/28/2015] hydrocortisone (CORTEF) tablet 20 mg  20 mg Oral q morning - 10a Alexa Sherral Hammers, MD      . Derrill Memo ON 01/28/2015] loratadine (CLARITIN) tablet 10 mg  10 mg Oral Daily Alexa Sherral Hammers, MD      . naproxen (NAPROSYN) tablet 500 mg  500 mg Oral BID PRN Alexa Sherral Hammers, MD      . nitroGLYCERIN (NITROSTAT) SL tablet 0.4 mg  0.4 mg Sublingual Q5 min PRN Alexa Sherral Hammers, MD      . oseltamivir (TAMIFLU) capsule 75 mg  75 mg Oral BID Alexa Sherral Hammers, MD      .  pantoprazole (PROTONIX) EC tablet 20 mg  20 mg Oral BID Alexa Sherral Hammers, MD      . promethazine (PHENERGAN) tablet 25 mg  25 mg Oral Q6H PRN Alexa Sherral Hammers, MD      . traMADol Veatrice Bourbon) tablet 50 mg  50 mg Oral Q6H PRN Alexa Sherral Hammers, MD        Allergies: Allergies as of 01/27/2015 - Review Complete 01/27/2015  Allergen Reaction Noted  . Acetaminophen Other (See Comments)   . Dyazide [hydrochlorothiazide w-triamterene] Hives 12/15/2010  . Lisinopril  05/18/2011  . Triamterene Hives 05/16/2012  . Morphine Rash and Hives 09/05/2007   Past Medical History  Diagnosis Date  . HIV infection ([place])     1994  . Hepatitis C     genotype 1b, 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 s/p 12 week course of simeprevir and sofosbuvir between October 2014 and January 2015 with resolution.  . Hypertension   .  GERD (gastroesophageal reflux disease)   . Depression   . Chronic back pain   . Hyperlipidemia   . History of shingles   . Refusal of blood transfusions as patient is Jehovah's Witness   . Prediabetes   . Allergic rhinitis 05/09/2006    Centricity Description: RHINITIS, ALLERGIC NOS Qualifier: Diagnosis of  By: Johnnye Sima MD, Dellis Filbert   Centricity Description: ALLERGIC RHINITIS Qualifier: Diagnosis of  By: Johnnye Sima MD, Dellis Filbert    . 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 Secondary adrenal insufficiency ([place]) 06/29/2014   Past Surgical History  Procedure Laterality Date  . Colectomy      2003 for diverticulitis  . Hand surgery    . Bunionectomy      b/l  . Shoulder surgery      left  . Tonsillectomy    . Nasal sinus surgery     Family History  Problem Relation Age of Onset  . Heart disease Mother   . Diabetes Mother   . Stroke Mother   . Heart disease Father   . Stroke Father   . Diabetes Father   . Hepatitis Sister     hcv  . Stroke Other   . Colon polyps Brother   . Cancer Sister     lung   Social History   Social History  . Marital Status: Single    Spouse Name: N/A  . Number of Children: 0  . Years of Education: N/A   Occupational History  . Not on file.   Social History Main Topics  . Smoking status: Former Smoker    Types: Cigarettes    Quit date: 04/26/2007  . Smokeless tobacco: Never Used     Comment: QUIT 2009  . Alcohol Use: No  . Drug Use: No  . Sexual Activity:    Partners: Male     Comment: pt. given condoms   Other Topics Concern  . Not on file   Social History Narrative   Alternate # [phone]    Review of Systems: Review of Systems  Constitutional: Positive for fever, chills and malaise/fatigue.  Eyes: Negative for blurred vision and double vision.  Respiratory: Positive for cough, sputum production and shortness of breath. Negative for wheezing.   Cardiovascular: Positive for chest pain.  Gastrointestinal: Positive for nausea,  vomiting and abdominal pain. Negative for diarrhea, constipation, blood in stool and melena.  Genitourinary: Negative for dysuria.  Musculoskeletal: Positive for myalgias.  Skin: Negative for rash.  Neurological: Positive for dizziness and headaches.   Physical  Exam: Blood pressure 140/77, pulse 104, temperature 100.5 F (38.1 C), temperature source Oral, resp. rate 16, SpO2 100 %.  GENERAL- alert, co-operative, not in any distress. HEENT- Atraumatic, normocephalic, PERRL, EOMI, oral mucosa appears dry, no cervical LN enlargement,, neck supple. CARDIAC- RRR, no murmurs, rubs or gallops. RESP- Moving equal volumes of air, and clear to auscultation bilaterally, no wheezes or crackles. ABDOMEN- Soft, nontender, no guarding or rebound, bowel sounds present. BACK- no CVA tenderness NEURO- AAOx3, no obvious Cr N abnormality, unable to stand due to weakness.  EXTREMITIES- pulse 2+, symmetric, no pedal edema. SKIN- Warm, dry, No rash or lesion. PSYCH- Normal mood and affect, appropriate thought content and speech.  Lab results: Basic Metabolic Panel:  Recent Labs  01/27/15 1555  NA 135  K 3.2*  CL 100*  CO2 25  GLUCOSE 108*  BUN 7  CREATININE 0.82  CALCIUM 9.0   Liver Function Tests:  Recent Labs  01/27/15 1555  AST 17  ALT 12*  ALKPHOS 92  BILITOT 0.7  PROT 7.8  ALBUMIN 3.5   CBC:  Recent Labs  01/27/15 1555  WBC 14.5*  NEUTROABS 11.0*  HGB 12.7  HCT 38.4  MCV 79.7  PLT 236   Urine Drug Screen: Drugs of Abuse     Component Value Date/Time   LABOPIA NONE DETECTED 08/04/2014 2054   COCAINSCRNUR NONE DETECTED 08/04/2014 2054   LABBENZ NONE DETECTED 08/04/2014 2054   AMPHETMU NONE DETECTED 08/04/2014 2054   THCU POSITIVE* 08/04/2014 2054   LABBARB NONE DETECTED 08/04/2014 2054    Imaging results:  No results found.  Assessment & Plan by Problem:  Ms. Tye Maryland is a 54 year old woman with well controlled HIV and adrenal insufficieny presenting with acute  onset fever, chills, generalized body aches likely 2/2 viral URI vs PNA.     SIRS with immune suppression likely 2/2 viral URI: Patient with 1 day history of fevers, chills, dizziness, weakness, poor appetite, N/V, SOB with productive cough and generalized body aches. Febrile 100.5, tachycardic, WBC 14.5 with predominantly neutrophils. BP stable. Satting 100% on RA. Lungs are clear to auscultation on exam. She does not appear toxic. Symptoms and onset more consistent with viral URI though productive cough and pleuritic chest pain cannot r/o PNA. Mildly orthostatic in clinic, likely 2/2 volume depletion.  - Influenza PCR - CBC and BMET in am - Blood cultures - Lactate - CXR - Maintain O2 sat >92 - Tamiflu 75 mg bid  - hydrocortisone 20 mg qam, 10 mg bid - NS with KCl at 125 mL/hr - Phenergan 25 mg PO q6hr prn - Robitussin q4hr prn - Droplet precautions  Hypokalemia: K 3.2 on admission. Replace.   HIV disease ([place]): CD4 count on 9/19 770 and VL <20. On prezcobix 800-150 mg daily and Descovy 200-25 mg daily.  - Continue home medications.   HTN (hypertension): BP 140/77. Takes amlodipine 5 mg at home.  - Hold BP medications  Secondary adrenal insufficiency ([place]): She takes chronic prednisone 10 mg qam and 5 mg bid.  - Will increase her steroids to 20 mg qam and 10 mg bid.   FEN: Clear liquid   DVT PPx: Lovneox 40 mg SQ daily  CODE: DNR/DNI  Dispo: Disposition is deferred at this time, awaiting improvement of current medical problems. Anticipated discharge in approximately 1-2 day(s).   The patient does have a current PCP Milagros Loll, MD) and does need an [facility] hospital follow-up appointment after discharge.  The patient does  not have transportation limitations that hinder transportation to clinic appointments.  Signed: Maryellen Pile, MD 01/27/2015, 7:14 PM

## 2015-01-27 NOTE — Progress Notes (Signed)
CRITICAL VALUE ALERT  Critical value received:  Lactic acid 2.3  Date of notification:  01/27/15  Time of notification:  2355  Critical value read back:Yes.    Nurse who received alert:  Amaryllis Dyke   MD notified (1st page):  Internal MED  Time of first page:  2356  MD notified (2nd page):  Time of second page:  Responding MD:  Melburn Hake, MD  Time MD responded:  [phone]

## 2015-01-27 NOTE — Telephone Encounter (Addendum)
Due to patient's insurance complete ultrasound needs prior approval. All notes and ultrasound results from 01/19/15 given to [facility] for PA. Pending insurance decision before scheduling this exam. Also left patient's mammogram appt information on her voice mail for 02/04/15 at 1:00 pm. Left the # [phone] [facility] if she needs to reschedule. Myrtis Hopping

## 2015-01-28 ENCOUNTER — Encounter (HOSPITAL_COMMUNITY): Payer: Self-pay

## 2015-01-28 DIAGNOSIS — J069 Acute upper respiratory infection, unspecified: Secondary | ICD-10-CM | POA: Diagnosis not present

## 2015-01-28 DIAGNOSIS — K219 Gastro-esophageal reflux disease without esophagitis: Secondary | ICD-10-CM | POA: Diagnosis present

## 2015-01-28 DIAGNOSIS — I951 Orthostatic hypotension: Secondary | ICD-10-CM | POA: Diagnosis present

## 2015-01-28 DIAGNOSIS — Z7952 Long term (current) use of systemic steroids: Secondary | ICD-10-CM | POA: Diagnosis not present

## 2015-01-28 DIAGNOSIS — I10 Essential (primary) hypertension: Secondary | ICD-10-CM | POA: Diagnosis present

## 2015-01-28 DIAGNOSIS — Z87891 Personal history of nicotine dependence: Secondary | ICD-10-CM | POA: Diagnosis not present

## 2015-01-28 DIAGNOSIS — M549 Dorsalgia, unspecified: Secondary | ICD-10-CM | POA: Diagnosis present

## 2015-01-28 DIAGNOSIS — E2749 Other adrenocortical insufficiency: Secondary | ICD-10-CM | POA: Diagnosis present

## 2015-01-28 DIAGNOSIS — E872 Acidosis: Secondary | ICD-10-CM | POA: Diagnosis present

## 2015-01-28 DIAGNOSIS — B1920 Unspecified viral hepatitis C without hepatic coma: Secondary | ICD-10-CM | POA: Diagnosis present

## 2015-01-28 DIAGNOSIS — B20 Human immunodeficiency virus [HIV] disease: Secondary | ICD-10-CM | POA: Diagnosis present

## 2015-01-28 DIAGNOSIS — Z79899 Other long term (current) drug therapy: Secondary | ICD-10-CM | POA: Diagnosis not present

## 2015-01-28 DIAGNOSIS — E785 Hyperlipidemia, unspecified: Secondary | ICD-10-CM | POA: Diagnosis present

## 2015-01-28 DIAGNOSIS — J029 Acute pharyngitis, unspecified: Secondary | ICD-10-CM | POA: Diagnosis present

## 2015-01-28 DIAGNOSIS — F329 Major depressive disorder, single episode, unspecified: Secondary | ICD-10-CM | POA: Diagnosis present

## 2015-01-28 DIAGNOSIS — E876 Hypokalemia: Secondary | ICD-10-CM | POA: Diagnosis present

## 2015-01-28 LAB — INFLUENZA PANEL BY PCR (TYPE A & B)
H1N1 flu by pcr: NOT DETECTED
INFLBPCR: NEGATIVE
Influenza A By PCR: NEGATIVE

## 2015-01-28 LAB — CBC
HEMATOCRIT: 37.8 % (ref 36.0–46.0)
Hemoglobin: 12.2 g/dL (ref 12.0–15.0)
MCH: 26 pg (ref 26.0–34.0)
MCHC: 32.3 g/dL (ref 30.0–36.0)
MCV: 80.4 fL (ref 78.0–100.0)
Platelets: 212 10*3/uL (ref 150–400)
RBC: 4.7 MIL/uL (ref 3.87–5.11)
RDW: 15.9 % — AB (ref 11.5–15.5)
WBC: 16 10*3/uL — ABNORMAL HIGH (ref 4.0–10.5)

## 2015-01-28 LAB — BASIC METABOLIC PANEL
Anion gap: 9 (ref 5–15)
CO2: 24 mmol/L (ref 22–32)
CREATININE: 0.63 mg/dL (ref 0.44–1.00)
Calcium: 8.4 mg/dL — ABNORMAL LOW (ref 8.9–10.3)
Chloride: 101 mmol/L (ref 101–111)
GFR calc Af Amer: >60 mL/min (ref >60)
GLUCOSE: 142 mg/dL — AB (ref 65–99)
POTASSIUM: 3.7 mmol/L (ref 3.5–5.1)
Sodium: 134 mmol/L — ABNORMAL LOW (ref 135–145)

## 2015-01-28 LAB — LACTIC ACID, PLASMA
LACTIC ACID, VENOUS: 1.9 mmol/L (ref 0.5–2.0)
Lactic Acid, Venous: 0.9 mmol/L (ref 0.5–2.0)
Lactic Acid, Venous: 3.5 mmol/L (ref 0.5–2.0)
Lactic Acid, Venous: 2.2 mmol/L (ref 0.5–2.0)

## 2015-01-28 MED ORDER — KETOROLAC TROMETHAMINE 15 MG/ML IJ SOLN
15.0000 mg | Freq: Once | INTRAMUSCULAR | Status: AC
  Administered 2015-01-28: 15 mg via INTRAVENOUS
  Filled 2015-01-28: qty 1

## 2015-01-28 MED ORDER — SODIUM CHLORIDE 0.9 % IV SOLN
INTRAVENOUS | Status: DC
  Administered 2015-01-28 – 2015-01-29 (×3): via INTRAVENOUS

## 2015-01-28 NOTE — Progress Notes (Signed)
Subjective: Patient seen and examined this morning. She reports some improvement from yesterday but still reports body aches, headache and some SOB though improved. She is complaining of throat pain today that is 10/10 and notes she thought she had some thrush overnight but oropharynx was clear yesterday and today.   Objective: Vital signs in last 24 hours: Filed Vitals:   01/28/15 0538 01/28/15 1056 01/28/15 1057 01/28/15 1058  BP: 127/69 137/81 135/79 139/83  Pulse: 106 107 111 112  Temp: 98.9 F (37.2 C) 97.8 F (36.6 C)    TempSrc: Oral Oral    Resp: 20 20 20 22   SpO2: 96% 98% 98% 97%   Weight change:   Intake/Output Summary (Last 24 hours) at 01/28/15 1153 Last data filed at 01/28/15 0939  Gross per 24 hour  Intake    222 ml  Output    300 ml  Net    -78 ml    PHYSICAL EXAM GENERAL- alert, co-operative, not in any distress. HEENT- Atraumatic, normocephalic, oral mucosa appears mosit, no cervical LN enlargement, neck supple. CARDIAC- RRR, no murmurs, rubs or gallops. RESP- Moving equal volumes of air, diffuse wheezes, no crackles. ABDOMEN- Soft, nontender, no guarding or rebound, bowel sounds present. BACK- no CVA tenderness NEURO- AAOx3  EXTREMITIES- pulse 2+, symmetric, no pedal edema. SKIN- Warm, dry, No rash or lesion. PSYCH- Normal mood and affect, appropriate thought content and speech.  Medications: I have reviewed the patient's current medications. Scheduled Meds: . darunavir-cobicistat  1 tablet Oral Daily  . emtricitabine-tenofovir AF  1 tablet Oral Daily  . enoxaparin (LOVENOX) injection  40 mg Subcutaneous Q24H  . hydrocortisone  10 mg Oral BID  . hydrocortisone  20 mg Oral q morning - 10a  . ketorolac  15 mg Intravenous Once  . loratadine  10 mg Oral Daily  . pantoprazole  20 mg Oral BID   Continuous Infusions: . 0.9 % NaCl with KCl 40 mEq / L 150 mL/hr (01/28/15 0430)   PRN Meds:.diazepam, guaiFENesin-dextromethorphan, nitroGLYCERIN,  promethazine, traMADol Assessment/Plan: Active Problems:   HIV disease ([place])   HTN (hypertension)   Secondary adrenal insufficiency ([facility])   Viral URI   Hypokalemia   Productive cough  Viral URI: Patient reports minimal improvement in symptoms today. Patient has been afebrile overnight. WBC increased 14.5 to 16. Remains tachycardic. She is satting well on room air. CXR clear. PCR negative for influenza. Blood cultures NGTD. Lactic acid was elevated overnight, resolved but was elevated again on repeat. Unclear why it is elevated again after resolving initially with fluids. Will continue to trend. Believe that all of her symptoms are 2/2 viral infection and will continue supportive care. Will give Toradol injection today for pain. - CBC and BMET in am - Continue to follow up blood cultures, NGTD - Trend Lactate - Toradol injection - Maintain O2 sat >92 - D/C Tamiflu 75 mg bid  - hydrocortisone 20 mg qam, 10 mg bid - NS with KCl at 125 mL/hr - Phenergan 25 mg PO q6hr prn - Robitussin q4hr prn  Hypokalemia: Resolved   HIV disease ([place]): CD4 count on 9/19 770 and VL <20. On prezcobix 800-150 mg daily and Descovy 200-25 mg daily.  - Continue home medications.   HTN (hypertension): BP stable. Takes amlodipine 5 mg at home.  - Hold BP medications  Secondary adrenal insufficiency ([place]): She takes chronic prednisone 10 mg qam and 5 mg bid.  - Will increase her steroids to 20 mg qam and 10  mg bid.   FEN: Clear liquid   DVT PPx: Lovneox 40 mg SQ daily  Dispo: Possible discharge later today or tomorrow  The patient does have a current PCP Milagros Loll, MD) and does need an [facility] hospital follow-up appointment after discharge.  The patient does not have transportation limitations that hinder transportation to clinic appointments.  .Services Needed at time of discharge: Y = Yes, Blank = No PT:   OT:   RN:   Equipment:   Other:       Maryellen Pile, MD [facility]  PGY-1 [phone] 01/28/2015, 11:53 AM

## 2015-01-28 NOTE — Care Management Note (Addendum)
Case Management Note  Patient Details  Name: [name] [name] MRN: [id number] Date of Birth: 15-Jul-1960  Subjective/Objective:             Date-01-28-15 Initial Assessment Spoke with patient at the bedside.  Introduced self as Tourist information centre manager and explained role in discharge planning and how to be reached.  Verified patient lives at [place] [phone].  Verified patient anticipates to go home with 1 brother at time of discharge and will have part-time supervision by family at this time to best of their knowledge.  Patient has no DME. Expressed potential need for no other DME.  Patient denied needing help with their medication.  Patient is driven by family or friends to MD appointments.  Verified patient has PCP Dr Johnnye Sima at [facility]. Patient states they currently receive [facility] services through no one.    Plan: CM will continue to follow for discharge planning and [facility] resources.   Carles Collet RN BSN CM [phone]        Action/Plan:  01-29-15 DC to home, independent, No CM needs at this time.   Expected Discharge Date:                  Expected Discharge Plan:  Home/Self Care  In-House Referral:     Discharge planning Services  CM Consult  Post Acute Care Choice:    Choice offered to:     DME Arranged:    DME Agency:     HH Arranged:    HH Agency:     Status of Service:  In process, will continue to follow  Medicare Important Message Given:    Date Medicare IM Given:    Medicare IM give by:    Date Additional Medicare IM Given:    Additional Medicare Important Message give by:     If discussed at [place] of Stay Meetings, dates discussed:    Additional Comments:  Carles Collet, RN 01/28/2015, 11:30 AM

## 2015-01-28 NOTE — Progress Notes (Signed)
Internal Medicine Clinic Attending  I saw and evaluated the patient.  I personally confirmed the key portions of the history and exam documented by Dr. Denton Brick and I reviewed pertinent patient test results.  The assessment, diagnosis, and plan were formulated together and I agree with the documentation in the resident's note.  Patient with excellent control of HIV presented with signs of severe viral illness causing clinical dehydration. Orthostatic changes noted in clinic. Patient reports inability to take oral fluids to rehydrate, so we will initiate IV hydration and observe on the inpatient service.

## 2015-01-29 LAB — BASIC METABOLIC PANEL
Anion gap: 8 (ref 5–15)
BUN: <5 mg/dL — ABNORMAL LOW (ref 6–20)
CALCIUM: 8.3 mg/dL — AB (ref 8.9–10.3)
CO2: 25 mmol/L (ref 22–32)
CREATININE: 0.6 mg/dL (ref 0.44–1.00)
Chloride: 104 mmol/L (ref 101–111)
GFR calc Af Amer: >60 mL/min (ref >60)
GFR calc non Af Amer: >60 mL/min (ref >60)
GLUCOSE: 131 mg/dL — AB (ref 65–99)
Potassium: 3.3 mmol/L — ABNORMAL LOW (ref 3.5–5.1)
Sodium: 137 mmol/L (ref 135–145)

## 2015-01-29 LAB — CBC
HEMATOCRIT: 36.8 % (ref 36.0–46.0)
Hemoglobin: 11.7 g/dL — ABNORMAL LOW (ref 12.0–15.0)
MCH: 25.7 pg — ABNORMAL LOW (ref 26.0–34.0)
MCHC: 31.8 g/dL (ref 30.0–36.0)
MCV: 80.7 fL (ref 78.0–100.0)
Platelets: 227 10*3/uL (ref 150–400)
RBC: 4.56 MIL/uL (ref 3.87–5.11)
RDW: 15.8 % — AB (ref 11.5–15.5)
WBC: 12.5 10*3/uL — ABNORMAL HIGH (ref 4.0–10.5)

## 2015-01-29 MED ORDER — LIDOCAINE VISCOUS 2 % MT SOLN: 15.0000 mL | OROMUCOSAL | Status: DC | PRN

## 2015-01-29 MED ORDER — ALBUTEROL SULFATE (2.5 MG/3ML) 0.083% IN NEBU: 2.5000 mg | INHALATION_SOLUTION | RESPIRATORY_TRACT | Status: DC | PRN

## 2015-01-29 MED ORDER — POTASSIUM CHLORIDE CRYS ER 20 MEQ PO TBCR
40.0000 meq | EXTENDED_RELEASE_TABLET | Freq: Once | ORAL | Status: AC
  Administered 2015-01-29: 40 meq via ORAL
  Filled 2015-01-29: qty 2

## 2015-01-29 MED ORDER — IBUPROFEN 600 MG PO TABS: 600.0000 mg | ORAL_TABLET | Freq: Four times a day (QID) | ORAL | Status: DC | PRN

## 2015-01-29 MED ORDER — LIDOCAINE VISCOUS 2 % MT SOLN
15.0000 mL | OROMUCOSAL | Status: DC | PRN
  Administered 2015-01-29: 15 mL via OROMUCOSAL
  Filled 2015-01-29 (×4): qty 15

## 2015-01-29 MED ORDER — METOPROLOL TARTRATE 25 MG PO TABS
25.0000 mg | ORAL_TABLET | Freq: Two times a day (BID) | ORAL | Status: DC
  Administered 2015-01-29: 25 mg via ORAL
  Filled 2015-01-29: qty 1

## 2015-01-29 MED ORDER — AMLODIPINE BESYLATE 5 MG PO TABS
5.0000 mg | ORAL_TABLET | Freq: Every day | ORAL | Status: DC
  Administered 2015-01-29: 5 mg via ORAL
  Filled 2015-01-29: qty 1

## 2015-01-29 MED ORDER — KETOROLAC TROMETHAMINE 15 MG/ML IJ SOLN
15.0000 mg | Freq: Once | INTRAMUSCULAR | Status: AC
  Administered 2015-01-29: 15 mg via INTRAVENOUS
  Filled 2015-01-29: qty 1

## 2015-01-29 MED ORDER — GUAIFENESIN-DM 100-10 MG/5ML PO SYRP: 5.0000 mL | ORAL_SOLUTION | ORAL | Status: DC | PRN

## 2015-01-29 NOTE — Discharge Instructions (Signed)
[name], you have a viral upper respiratory infection. It will take some time to completely get over the infection. We will give you some medications to help manage the symptoms. Take Ibuprofen 600 mg every 6 hours as needed for headache and pain, Lidocaine mouthwash for sore throat, Robitussin for cough, phenergan for nausea. Try to take in as much fluids as you can to stay hydrated.   You have a follow up appointment with Dr. Juleen China on Thursday October 13th at 8:30 am.   Viral Infections A viral infection can be caused by different types of viruses.Most viral infections are not serious and resolve on their own. However, some infections may cause severe symptoms and may lead to further complications. SYMPTOMS Viruses can frequently cause:  Minor sore throat.  Aches and pains.  Headaches.  Runny nose.  Different types of rashes.  Watery eyes.  Tiredness.  Cough.  Loss of appetite.  Gastrointestinal infections, resulting in nausea, vomiting, and diarrhea. These symptoms do not respond to antibiotics because the infection is not caused by bacteria. However, you might catch a bacterial infection following the viral infection. This is sometimes called a "superinfection." Symptoms of such a bacterial infection may include:  Worsening sore throat with pus and difficulty swallowing.  Swollen neck glands.  Chills and a high or persistent fever.  Severe headache.  Tenderness over the sinuses.  Persistent overall ill feeling (malaise), muscle aches, and tiredness (fatigue).  Persistent cough.  Yellow, green, or brown mucus production with coughing. HOME CARE INSTRUCTIONS   Only take over-the-counter or prescription medicines for pain, discomfort, diarrhea, or fever as directed by your caregiver.  Drink enough water and fluids to keep your urine clear or pale yellow. Sports drinks can provide valuable electrolytes, sugars, and hydration.  Get plenty of rest and maintain  proper nutrition. Soups and broths with crackers or rice are fine. SEEK IMMEDIATE MEDICAL CARE IF:   You have severe headaches, shortness of breath, chest pain, neck pain, or an unusual rash.  You have uncontrolled vomiting, diarrhea, or you are unable to keep down fluids.  You or your child has an oral temperature above 102 F (38.9 C), not controlled by medicine.  Your baby is older than 3 months with a rectal temperature of 102 F (38.9 C) or higher.  Your baby is 69 months old or younger with a rectal temperature of 100.4 F (38 C) or higher. MAKE SURE YOU:   Understand these instructions.  Will watch your condition.  Will get help right away if you are not doing well or get worse.   This information is not intended to replace advice given to you by your health care provider. Make sure you discuss any questions you have with your health care provider.   Document Released: 01/19/2005 Document Revised: 07/04/2011 Document Reviewed: 09/17/2014 Elsevier Interactive Patient Education [employer].

## 2015-01-29 NOTE — Discharge Summary (Signed)
Name: [name] [name] MRN: [id number] DOB: 1960-06-03 54 y.o. PCP: Milagros Loll, MD  Date of Admission: 01/27/2015  6:28 PM Date of Discharge: 01/29/2015 Attending Physician: Sid Falcon, MD  Discharge Diagnosis: Active Problems:   HIV disease ([place])   HTN (hypertension)   Secondary adrenal insufficiency ([place])   Viral URI   Productive cough  Discharge Medications:   Medication List    STOP taking these medications        naproxen 500 MG tablet  Commonly known as:  NAPROSYN      TAKE these medications        amLODipine 5 MG tablet  Commonly known as:  NORVASC  TAKE 1 TABLET BY MOUTH ONCE DAILY     cetirizine 10 MG tablet  Commonly known as:  ZYRTEC  TAKE 1 TABLET BY MOUTH ONCE DAILY AS NEEDED FOR ALLERGIES     darunavir-cobicistat 800-150 MG tablet  Commonly known as:  PREZCOBIX  Take 1 tablet by mouth daily. Swallow whole. Do NOT crush, break or chew tablets. Take with food.     diazepam 5 MG tablet  Commonly known as:  VALIUM  TAKE 1 TABLET BY MOUTH AT BEDTIME AS NEEDED     diphenhydrAMINE 25 MG tablet  Commonly known as:  BENADRYL  TAKE 1-2 TABLETS BY MOUTH EVERY NIGHT AT BEDTIME AS NEEDED FOR ITCHING     guaiFENesin-dextromethorphan 100-10 MG/5ML syrup  Commonly known as:  ROBITUSSIN DM  Take 5 mLs by mouth every 4 (four) hours as needed for cough.     hydrocortisone 10 MG tablet  Commonly known as:  CORTEF  Take 10mg  at 8AM, 5mg  at 12PM. May take 5mg  at [facility] as needed     ibuprofen 600 MG tablet  Commonly known as:  ADVIL,MOTRIN  Take 1 tablet (600 mg total) by mouth every 6 (six) hours as needed for headache or mild pain.     lidocaine 2 % solution  Commonly known as:  XYLOCAINE  Use as directed 15 mLs in the mouth or throat every 4 (four) hours as needed for mouth pain.     metoprolol tartrate 25 MG tablet  Commonly known as:  LOPRESSOR  Take 1 tablet (25 mg total) by mouth 2 (two) times daily.     montelukast 10 MG tablet  Commonly known  as:  SINGULAIR  TAKE 1 TABLET BY MOUTH EVERY DAY     nitroGLYCERIN 0.4 MG SL tablet  Commonly known as:  NITROSTAT  Place 1 tablet (0.4 mg total) under the tongue every 5 (five) minutes as needed for chest pain.     pantoprazole 20 MG tablet  Commonly known as:  PROTONIX  TAKE 1 TABLET BY MOUTH TWICE DAILY.     PATADAY 0.2 % Soln  Generic drug:  Olopatadine HCl  Apply 1 drop to eye daily as needed. For allergies     PROAIR HFA 108 (90 BASE) MCG/ACT inhaler  Generic drug:  albuterol  INHALE 1-2 PUFFS BY MOUTH INTO THE LUNGS EVERY 6 HOURS AS NEEDED     promethazine 50 MG tablet  Commonly known as:  PHENERGAN  TAKE 1/2 TABLET BY MOUTH EVERY 6 HOURS AS NEEDED FOR NAUSEA OR VOMITING     traMADol 50 MG tablet  Commonly known as:  ULTRAM  TAKE 2 TABLETS BY MOUTH EVERY 12 HOURS AS NEEDED FOR PAIN ; MUST LAST 30 DAYS     TRUVADA 200-300 MG tablet  Generic drug:  emtricitabine-tenofovir  TAKE 1 TABLET BY MOUTH EVERY DAY     Vitamin D (Cholecalciferol) 1000 UNITS Caps  Take 1,000 mg by mouth daily.        Disposition and follow-up:   [name] [name] was discharged from [facility] in Stable condition.  At the hospital follow up visit please address:  1. [name] [name] was admitted for viral URI. Flu negative. Please assess for resolution of symptoms (main complaints were sore throat, headaches, cough, body aches). She has addison's disease and is on chronic steroids. We increased her steroid dose, she was to follow up with her endocrinologist about her dosing after discharge.  2.  Labs / imaging needed at time of follow-up: None  3.  Pending labs/ test needing follow-up: Blood cultures (NG at 2 days at discharge)  Follow-up Appointments: Follow-up Information    Follow up with Jule Ser, DO On 02/05/2015.   Specialty:  Internal Medicine   Why:  at 8:30 AM   Contact information:   [place] [id number] [phone]       Discharge  Instructions: Discharge Instructions    Call MD for:  difficulty breathing, headache or visual disturbances    Complete by:  As directed      Call MD for:  persistant nausea and vomiting    Complete by:  As directed      Call MD for:  severe uncontrolled pain    Complete by:  As directed      Call MD for:  temperature >100.4    Complete by:  As directed      Diet - low sodium heart healthy    Complete by:  As directed      Increase activity slowly    Complete by:  As directed            Consultations:    Procedures Performed:  X-ray Chest Pa And Lateral  01/27/2015   CLINICAL DATA:  Productive cough.  EXAM: CHEST  2 VIEW  COMPARISON:  March 22, 2011.  FINDINGS: The heart size and mediastinal contours are within normal limits. Both lungs are clear. No pneumothorax or pleural effusion is noted. The visualized skeletal structures are unremarkable.  IMPRESSION: No active cardiopulmonary disease.   Electronically Signed   By: Marijo Conception, M.D.   On: 01/27/2015 21:53   US Abdomen Limited Ruq  01/19/2015   CLINICAL DATA:  History of hepatitis-C, hepatic fibrosis, HIV  EXAM: US ABDOMEN LIMITED - RIGHT UPPER QUADRANT  COMPARISON:  Abdominal pelvic CT scan of August 04, 2014  FINDINGS: Gallbladder:  No gallstones or wall thickening visualized. No sonographic Murphy sign noted.  Common bile duct:  Diameter: 4.9 mm  Liver:  The hepatic echotexture is normal. In the left hepatic lobe a cyst is noted measuring up to 9 mm in diameter. This was visible on the previous study.  IMPRESSION: No acute abnormality of the liver, common bile duct, or gallbladder. Stable sub cm left hepatic lobe cyst.   Electronically Signed   By: David  Martinique M.D.   On: 01/19/2015 10:06   Admission HPI: [name] [name] is a 54 year old female with a PMH of well controlled HIV (CD4 count on 9/19 770 and VL <20), Hepatitis C s/p treatment, HTN, secondary adrenal insufficiency presented [facility] this afternoon with  complaints of feeling ill. She reports that she felt fine yesterday afternoon and went to her ID clinic appointment for routine follow up.  After leaving she began to feel dizzy and nauseated. Continued to get worse over the evening and developed chills, generalized body aches, blurred vision and headaches. She checked her temperature and was 100.2. She reports that she is SOB with pleuritic chest pain and productive cough with yellow blood streaked sputum. She had one episode of emesis with yellow and streaks of blood. Denies any diarrhea or runny nose. Decreased appetite. Reports sore throat.   She called her endocrinologist this morning who told her to double her dose of steroids for 3 days. She takes 10 mg in the morning, 5 mg afternoon and evening. She took her normal 10 mg dose this morning increased to 10 mg afternoon dose after speaking with her endocrinologist.  Orthostatics in [facility] positive for orthostatic hypotension.   Hospital Course by problem list:   Viral URI: [name] [name] presented with 1 day history of fevers, chills, dizziness, weakness, poor appetite, N/V, SOB with productive cough and generalized body aches. On admission, she was febrile to 100.5, tachycardic, WBC 14.5 with predominantly neutrophils, normotensive and satting 100% on RA. Lungs were clear to auscultation on exam. Lactic acidosis resolved with fluids. Suspected viral URI/influenza based on symptoms. CXR was clear, blood cultures NGTD. Initially started Tamiflu but Influenza PCR was negative. We gave her stress dose steroids and supportive care. Symptoms improved with Toradol injection and vicous lidocaine mouth solution. Discussed with her the natural course of a viral URI and what to expect. Discharged with symptomatic control. Instructed patient to discuss her steroid dose with her endocrinologist at discharge.   Hypokalemia: K 3.2 on admission. Resolved with KCl.   HIV disease ([place]): CD4 count on 9/19 770 and VL  <20. On prezcobix 800-150 mg daily and Descovy 200-25 mg daily. Continued home medications.   HTN (hypertension): Normotensive during hospitalization. Takes amlodipine 5 mg and metoprolol 25 mg bid. Held during hospital course and restarted at discharge.   Secondary adrenal insufficiency ([place]): She takes chronic prednisone 10 mg qam and 5 mg bid. Increase her steroids to 20 mg qam and 10 mg bid. Follow up with endocrinologist for steroid dosing.   Discharge Vitals:   BP 139/74 mmHg  Pulse 103  Temp(Src) 98.7 F (37.1 C) (Oral)  Resp 16  Ht 5\' 1"  (1.549 m)  Wt 159 lb 9.8 oz (72.4 kg)  BMI 30.17 kg/m2  SpO2 93%  Discharge Labs:  Results for orders placed or performed during the hospital encounter of 01/27/15 (from the past 24 hour(s))  Lactic acid, plasma     Status: Abnormal   Collection Time: 01/28/15  2:31 PM  Result Value Ref Range   Lactic Acid, Venous 2.2 (HH) 0.5 - 2.0 mmol/L  Lactic acid, plasma     Status: None   Collection Time: 01/28/15  5:27 PM  Result Value Ref Range   Lactic Acid, Venous 1.9 0.5 - 2.0 mmol/L  CBC     Status: Abnormal   Collection Time: 01/29/15  6:22 AM  Result Value Ref Range   WBC 12.5 (H) 4.0 - 10.5 K/uL   RBC 4.56 3.87 - 5.11 MIL/uL   Hemoglobin 11.7 (L) 12.0 - 15.0 g/dL   HCT 36.8 36.0 - 46.0 %   MCV 80.7 78.0 - 100.0 fL   MCH 25.7 (L) 26.0 - 34.0 pg   MCHC 31.8 30.0 - 36.0 g/dL   RDW 15.8 (H) 11.5 - 15.5 %   Platelets 227 150 - 400 K/uL  Basic metabolic panel  Status: Abnormal   Collection Time: 01/29/15  6:22 AM  Result Value Ref Range   Sodium 137 135 - 145 mmol/L   Potassium 3.3 (L) 3.5 - 5.1 mmol/L   Chloride 104 101 - 111 mmol/L   CO2 25 22 - 32 mmol/L   Glucose, Bld 131 (H) 65 - 99 mg/dL   BUN <5 (L) 6 - 20 mg/dL   Creatinine, Ser 0.60 0.44 - 1.00 mg/dL   Calcium 8.3 (L) 8.9 - 10.3 mg/dL   GFR calc non Af Amer >60 >60 mL/min   GFR calc Af Amer >60 >60 mL/min   Anion gap 8 5 - 15    Signed: Maryellen Pile,  MD 01/29/2015, 2:11 PM

## 2015-01-29 NOTE — Progress Notes (Signed)
Subjective: Patient seen and examined this morning. She reports she felt better yesterday but started feeling worse overnight and is much worse this morning. Mainly complaining of headache and sore throat. Still having some dyspnea and productive cough.   Objective: Vital signs in last 24 hours: Filed Vitals:   01/28/15 1058 01/28/15 2042 01/28/15 2335 01/29/15 0440  BP: 139/83 160/91  138/77  Pulse: 112 103  112  Temp:  98 F (36.7 C)  98.7 F (37.1 C)  TempSrc:  Oral  Oral  Resp: 22 18  16   Height:   5\' 1"  (1.549 m)   Weight:   159 lb 9.8 oz (72.4 kg)   SpO2: 97% 98%  93%   Weight change:   Intake/Output Summary (Last 24 hours) at 01/29/15 0732 Last data filed at 01/29/15 0659  Gross per 24 hour  Intake 2379.5 ml  Output   1000 ml  Net 1379.5 ml    PHYSICAL EXAM GENERAL- alert, co-operative, not in any distress. HEENT- Atraumatic, normocephalic, oral mucosa appears mosit, no cervical LN enlargement, neck supple. Oropharynx clear.  CARDIAC- RRR, no murmurs, rubs or gallops. RESP- Moving equal volumes of air, diffuse wheezes, no crackles. ABDOMEN- Soft, nontender, no guarding or rebound, bowel sounds present. NEURO- AAOx3  EXTREMITIES- pulse 2+, symmetric, no pedal edema. SKIN- Warm, dry, No rash or lesion.  Medications: I have reviewed the patient's current medications. Scheduled Meds: . darunavir-cobicistat  1 tablet Oral Daily  . emtricitabine-tenofovir AF  1 tablet Oral Daily  . enoxaparin (LOVENOX) injection  40 mg Subcutaneous Q24H  . hydrocortisone  10 mg Oral BID  . hydrocortisone  20 mg Oral q morning - 10a  . loratadine  10 mg Oral Daily  . pantoprazole  20 mg Oral BID   Continuous Infusions: . sodium chloride 150 mL/hr at 01/29/15 0621   PRN Meds:.albuterol, diazepam, guaiFENesin-dextromethorphan, nitroGLYCERIN, promethazine, traMADol Assessment/Plan: Active Problems:   HIV disease ([place])   HTN (hypertension)   Secondary adrenal insufficiency  ([facility])   Viral URI   Productive cough  Viral URI: Patient has been afebrile overnight. WBC improtved from 16 to 12.5, on stress dose steroids. Remains tachycardic (off her BB). Will restart home metoprolol. She is satting well on room air. Blood cultures NGTD. Lactic acidosis resolved. Believe that all of her symptoms are 2/2 viral infection and will continue supportive care. Good PO intake. Discharge  today.  - Continue to follow up blood cultures, NGTD - Toradol injection this morning - Ibuprofen 600 mg q6prn  - Vicous lidocaine mouth solution - Maintain O2 sat >92 - hydrocortisone 20 mg qam, 10 mg bid - Phenergan 25 mg PO q6hr prn - Robitussin q4hr prn - D/C IVF  Hypokalemia: K 3.3 this morning. KCl 40 mEq.   HIV disease ([place]): CD4 count on 9/19 770 and VL <20. On prezcobix 800-150 mg daily and Descovy 200-25 mg daily.  - Continue home medications.   HTN (hypertension): BP stable. Takes amlodipine 5 mg and metoprolol 25 mg bid at home.  - Restart home meds  Secondary adrenal insufficiency [facility]): She takes chronic prednisone 10 mg qam and 5 mg bid.  - Will increase her steroids to 20 mg qam and 10 mg bid.   FEN: As tolerated   DVT PPx: Lovneox 40 mg SQ daily  Dispo: Discharge today  The patient does have a current PCP Milagros Loll, MD) and does need an [facility] hospital follow-up appointment after discharge.  The patient does not  have transportation limitations that hinder transportation to clinic appointments.  .Services Needed at time of discharge: Y = Yes, Blank = No PT:   OT:   RN:   Equipment:   Other:     LOS: 1 day   Maryellen Pile, MD [facility] PGY-1 [phone] 01/29/2015, 7:32 AM

## 2015-01-29 NOTE — Progress Notes (Signed)
[name] [name] discharged Home per MD order.  Discharge instructions reviewed and discussed with the patient, all questions and concerns answered. Copy of instructions and care note for new medications & diagnosis given to patient.    Medication List    STOP taking these medications        naproxen 500 MG tablet  Commonly known as:  NAPROSYN      TAKE these medications        amLODipine 5 MG tablet  Commonly known as:  NORVASC  TAKE 1 TABLET BY MOUTH ONCE DAILY     cetirizine 10 MG tablet  Commonly known as:  ZYRTEC  TAKE 1 TABLET BY MOUTH ONCE DAILY AS NEEDED FOR ALLERGIES     darunavir-cobicistat 800-150 MG tablet  Commonly known as:  PREZCOBIX  Take 1 tablet by mouth daily. Swallow whole. Do NOT crush, break or chew tablets. Take with food.     diazepam 5 MG tablet  Commonly known as:  VALIUM  TAKE 1 TABLET BY MOUTH AT BEDTIME AS NEEDED     diphenhydrAMINE 25 MG tablet  Commonly known as:  BENADRYL  TAKE 1-2 TABLETS BY MOUTH EVERY NIGHT AT BEDTIME AS NEEDED FOR ITCHING     guaiFENesin-dextromethorphan 100-10 MG/5ML syrup  Commonly known as:  ROBITUSSIN DM  Take 5 mLs by mouth every 4 (four) hours as needed for cough.     hydrocortisone 10 MG tablet  Commonly known as:  CORTEF  Take 10mg  at 8AM, 5mg  at 12PM. May take 5mg  at 4PM as needed     ibuprofen 600 MG tablet  Commonly known as:  ADVIL,MOTRIN  Take 1 tablet (600 mg total) by mouth every 6 (six) hours as needed for headache or mild pain.     lidocaine 2 % solution  Commonly known as:  XYLOCAINE  Use as directed 15 mLs in the mouth or throat every 4 (four) hours as needed for mouth pain.     metoprolol tartrate 25 MG tablet  Commonly known as:  LOPRESSOR  Take 1 tablet (25 mg total) by mouth 2 (two) times daily.     montelukast 10 MG tablet  Commonly known as:  SINGULAIR  TAKE 1 TABLET BY MOUTH EVERY DAY     nitroGLYCERIN 0.4 MG SL tablet  Commonly known as:  NITROSTAT  Place 1 tablet (0.4 mg total)  under the tongue every 5 (five) minutes as needed for chest pain.     pantoprazole 20 MG tablet  Commonly known as:  PROTONIX  TAKE 1 TABLET BY MOUTH TWICE DAILY.     PATADAY 0.2 % Soln  Generic drug:  Olopatadine HCl  Apply 1 drop to eye daily as needed. For allergies     PROAIR HFA 108 (90 BASE) MCG/ACT inhaler  Generic drug:  albuterol  INHALE 1-2 PUFFS BY MOUTH INTO THE LUNGS EVERY 6 HOURS AS NEEDED     promethazine 50 MG tablet  Commonly known as:  PHENERGAN  TAKE 1/2 TABLET BY MOUTH EVERY 6 HOURS AS NEEDED FOR NAUSEA OR VOMITING     traMADol 50 MG tablet  Commonly known as:  ULTRAM  TAKE 2 TABLETS BY MOUTH EVERY 12 HOURS AS NEEDED FOR PAIN ; MUST LAST 30 DAYS     TRUVADA 200-300 MG tablet  Generic drug:  emtricitabine-tenofovir  TAKE 1 TABLET BY MOUTH EVERY DAY     Vitamin D (Cholecalciferol) 1000 UNITS Caps  Take 1,000 mg by mouth daily.  Patients skin is clean, dry and intact, no evidence of skin break down. IV site discontinued and catheter remains intact. Site without signs and symptoms of complications. Dressing and pressure applied.  Patient escorted to car by NT in a wheelchair,  no distress noted upon discharge.  Jaxyn Rout C 01/29/2015 2:32 PM

## 2015-02-01 LAB — CULTURE, BLOOD (ROUTINE X 2)
CULTURE: NO GROWTH
CULTURE: NO GROWTH

## 2015-02-04 ENCOUNTER — Ambulatory Visit: Payer: Medicaid Other

## 2015-02-04 NOTE — Telephone Encounter (Signed)
Called to pharm 

## 2015-02-05 ENCOUNTER — Encounter: Payer: Self-pay | Admitting: Internal Medicine

## 2015-02-05 ENCOUNTER — Ambulatory Visit (INDEPENDENT_AMBULATORY_CARE_PROVIDER_SITE_OTHER): Payer: Medicaid Other | Admitting: Internal Medicine

## 2015-02-05 ENCOUNTER — Telehealth: Payer: Self-pay | Admitting: *Deleted

## 2015-02-05 VITALS — BP 138/81 | HR 88 | Temp 98.5°F | Wt 164.6 lb

## 2015-02-05 DIAGNOSIS — E2749 Other adrenocortical insufficiency: Secondary | ICD-10-CM | POA: Diagnosis not present

## 2015-02-05 DIAGNOSIS — J069 Acute upper respiratory infection, unspecified: Secondary | ICD-10-CM | POA: Diagnosis present

## 2015-02-05 MED ORDER — HYDROCODONE-HOMATROPINE 5-1.5 MG/5ML PO SYRP: 5.0000 mL | ORAL_SOLUTION | Freq: Four times a day (QID) | ORAL | Status: DC | PRN

## 2015-02-05 MED ORDER — HYDROCOD POLST-CPM POLST ER 10-8 MG/5ML PO SUER: 5.0000 mL | Freq: Two times a day (BID) | ORAL | Status: DC

## 2015-02-05 NOTE — Assessment & Plan Note (Addendum)
Assessment: Patient reports feeling improved since hospital discharge and is without fever or chills.  Still complaining of generalized muscle aches that she is symptomatically treating with ibuprofen as needed.  She is still coughing a lot and reports cough is mildly productive of a light, yellow mucus.  She has been trying to suppress her cough with Robitussin DM but has gotten only mild relief.  States her sore throat is well controlled with Lidocaine 2% solution.  No nausea or vomiting.  She states she is not wheezing like she had been prior to hospitalization.  Denies any SOB, CP.  Physical exam reveals normal respiratory effort, normal breath sounds.  Throat without signs of postnasal drip, tonsillar exudate or erythema.  Afebrile.  Final blood culture report from admission NG.  Plan: -Stop Robitussin DM -Start Hycodan 29mL q6h prn --> patient informed [facility] that insurance will not cover this medication.  Will try Tussionex BID instead --> patient called back and stated she spoke to case manager re: Hycodan and they will help cover this medication.  Will continue with original plan to tx with Hycodan -Continue Lidocaine 2% prn for throat irritation -Encourage supportive care - plenty of fluids, salt water gargle -RTC in 1 week if cough is not improved to check repeat CXR, adjust plan of care accordingly

## 2015-02-05 NOTE — Addendum Note (Signed)
Addended by: Mignon Pine on: 02/05/2015 03:32 PM   Modules accepted: Orders, Medications

## 2015-02-05 NOTE — Assessment & Plan Note (Signed)
Assessment: Patient has been on high dose steroids since admission and discharge per her endocrinologist.  She has appointment scheduled today for follow up.  Plan: -Encouraged patient to keep appointment today with endocrinology

## 2015-02-05 NOTE — Addendum Note (Signed)
Addended by: Mignon Pine on: 02/05/2015 11:27 AM   Modules accepted: Orders, Medications

## 2015-02-05 NOTE — Progress Notes (Signed)
[name], female   DOB: 08/02/1960, 54 y.o.   MRN: [id number]   Subjective:   [name] female   DOB: 1961-01-09 54 y.o.   MRN: [id number]  HPI: [name] is a 54 y.o. female with past medical hx of well controlled HIV (CD4 770, VL <20 in September 2016), hepatitis C treated, HTN, adrenal insufficiency who presents today for hospital follow up of a viral URI.  Patient reports feeling improved since hospital discharge and is without fever or chills.  Still complaining of generalized muscle aches that she is symptomatically treating with ibuprofen as needed.  She is still coughing a lot and reports cough is mildly productive of a light, yellow mucus.  She has been trying to suppress her cough with Robitussin DM but has gotten only mild relief.  States her sore throat is well controlled with Lidocaine 2% solution.  No nausea or vomiting.  She states she is not wheezing like she had been prior to hospitalization.  Denies any SOB, CP.    Patient has been on high dose steroids since admission and discharge per her endocrinologist.  She has appointment scheduled today for follow up.     Past Medical History  Diagnosis Date  . HIV infection ([place])     1994  . Hepatitis C     genotype 1b, 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 s/p 12 week course of simeprevir and sofosbuvir between October 2014 and January 2015 with resolution.  . Hypertension   . GERD (gastroesophageal reflux disease)   . Depression   . Chronic back pain   . Hyperlipidemia   . History of shingles   . Refusal of blood transfusions as patient is Jehovah's Witness   . Prediabetes   . Allergic rhinitis 05/09/2006    Centricity Description: RHINITIS, ALLERGIC NOS Qualifier: Diagnosis of  By: Johnnye Sima MD, Dellis Filbert   Centricity Description: ALLERGIC RHINITIS Qualifier: Diagnosis of  By: Johnnye Sima MD, Dellis Filbert    . 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 Secondary adrenal insufficiency ([place])  06/29/2014   Current Outpatient Prescriptions  Medication Sig Dispense Refill  . amLODipine (NORVASC) 5 MG tablet TAKE 1 TABLET BY MOUTH ONCE DAILY (Patient taking differently: TAKE 5 MG BY MOUTH DAILY.) 90 tablet 1  . cetirizine (ZYRTEC) 10 MG tablet TAKE 1 TABLET BY MOUTH ONCE DAILY AS NEEDED FOR ALLERGIES (Patient taking differently: TAKE 10 MG BY MOUTH DAILY AS NEEDED FOR ALLERGIES) 90 tablet 3  . darunavir-cobicistat (PREZCOBIX) 800-150 MG per tablet Take 1 tablet by mouth daily. Swallow whole. Do NOT crush, break or chew tablets. Take with food. 90 tablet 3  . diazepam (VALIUM) 5 MG tablet TAKE 1 TABLET BY MOUTH EVERY NIGHT AT BEDTIME AS NEEDED 20 tablet 0  . diphenhydrAMINE (BENADRYL) 25 MG tablet TAKE 1-2 TABLETS BY MOUTH EVERY NIGHT AT BEDTIME AS NEEDED FOR ITCHING 20 tablet 1  . HYDROcodone-homatropine (HYCODAN) 5-1.5 MG/5ML syrup Take 5 mLs by mouth every 6 (six) hours as needed for cough. 120 mL 0  . hydrocortisone (CORTEF) 10 MG tablet Take 10mg  at 8AM, 5mg  at 12PM. May take 5mg  at 4PM as needed (Patient taking differently: Take 10 mg by mouth 3 (three) times daily. Take 10mg  at 8AM, 5mg  at 12PM. May take 5mg  at [facility] as needed) 45 tablet 1  . ibuprofen (ADVIL,MOTRIN) 600 MG tablet Take 1 tablet (600 mg total) by mouth every 6 (six) hours as needed for headache or mild  pain. 30 tablet 0  . lidocaine (XYLOCAINE) 2 % solution Use as directed 15 mLs in the mouth or throat every 4 (four) hours as needed for mouth pain. 100 mL 0  . metoprolol tartrate (LOPRESSOR) 25 MG tablet Take 1 tablet (25 mg total) by mouth 2 (two) times daily. 60 tablet 3  . montelukast (SINGULAIR) 10 MG tablet TAKE 1 TABLET BY MOUTH EVERY DAY (Patient taking differently: TAKE 10 MG BY MOUTH DAILY) 90 tablet 1  . nitroGLYCERIN (NITROSTAT) 0.4 MG SL tablet Place 1 tablet (0.4 mg total) under the tongue every 5 (five) minutes as needed for chest pain. 25 tablet 3  . Olopatadine HCl (PATADAY) 0.2 % SOLN Apply 1 drop to eye  daily as needed. For allergies     . pantoprazole (PROTONIX) 20 MG tablet TAKE 1 TABLET BY MOUTH TWICE DAILY. 60 tablet 3  . PROAIR HFA 108 (90 BASE) MCG/ACT inhaler INHALE 1-2 PUFFS BY MOUTH INTO THE LUNGS EVERY 6 HOURS AS NEEDED (Patient taking differently: INHALE 1-2 PUFFS BY MOUTH INTO THE LUNGS EVERY 6 HOURS AS NEEDED FOR SHORTNESS OF BREATH) 8.5 g 1  . promethazine (PHENERGAN) 50 MG tablet TAKE 1/2 TABLET BY MOUTH EVERY 6 HOURS AS NEEDED FOR NAUSEA OR VOMITING (Patient taking differently: TAKE 25 MG BY MOUTH EVERY 6 HOURS AS NEEDED FOR NAUSEA OR VOMITING.) 1 tablet 1  . traMADol (ULTRAM) 50 MG tablet TAKE 2 TABLETS BY MOUTH EVERY 12 HOURS AS NEEDED FOR PAIN ; MUST LAST 30 DAYS (Patient taking differently: Take 100 mg by mouth every 12 (twelve) hours as needed for moderate pain or severe pain. ) 60 tablet 0  . TRUVADA 200-300 MG per tablet TAKE 1 TABLET BY MOUTH EVERY DAY 30 tablet 10  . Vitamin D, Cholecalciferol, 1000 UNITS CAPS Take 1,000 mg by mouth daily. 90 capsule 1  . [DISCONTINUED] hydrochlorothiazide (HYDRODIURIL) 25 MG tablet Take 25 mg by mouth daily.     No current facility-administered medications for this visit.   Family History  Problem Relation Age of Onset  . Heart disease Mother   . Diabetes Mother   . Stroke Mother   . Heart disease Father   . Stroke Father   . Diabetes Father   . Hepatitis Sister     hcv  . Stroke Other   . Colon polyps Brother   . Cancer Sister     lung   Social History   Social History  . Marital Status: Single    Spouse Name: N/A  . Number of Children: 0  . Years of Education: N/A   Social History Main Topics  . Smoking status: Former Smoker    Types: Cigarettes    Quit date: 04/26/2007  . Smokeless tobacco: Never Used     Comment: QUIT 2009  . Alcohol Use: No  . Drug Use: No  . Sexual Activity: Not Asked     Comment: pt. given condoms   Other Topics Concern  . None   Social History Narrative   Alternate # [phone]    Review of Systems: Review of Systems  Constitutional: Negative for fever and chills.  HENT: Positive for congestion and sore throat. Negative for postnasal drip, rhinorrhea and sinus pressure.   Eyes: Negative for pain.  Respiratory: Positive for cough. Negative for shortness of breath and wheezing.   Cardiovascular: Negative for chest pain.  Gastrointestinal: Negative for nausea, vomiting and abdominal pain.  Genitourinary: Negative for difficulty urinating.  Musculoskeletal: Negative for back  pain and neck pain.  Skin: Negative for color change.  Neurological: Negative for dizziness and light-headedness.  Psychiatric/Behavioral: Negative for confusion.   Objective:  Physical Exam: Filed Vitals:   02/05/15 0836  BP: 138/81  Pulse: 88  Temp: 98.5 F (36.9 C)  TempSrc: Oral  Weight: 164 lb 9.6 oz (74.662 kg)  SpO2: 99%   Physical Exam  Constitutional: She is oriented to person, place, and time. She appears well-developed and well-nourished. She is cooperative.  Non-toxic appearance.  HENT:  Head: Normocephalic and atraumatic.  Right Ear: External ear normal.  Left Ear: External ear normal.  Nose: Nose normal.  Mouth/Throat: Uvula is midline, oropharynx is clear and moist and mucous membranes are normal. No oropharyngeal exudate or posterior oropharyngeal erythema.  Eyes: Conjunctivae and EOM are normal.  Neck: Normal range of motion.  Cardiovascular: Normal rate, regular rhythm, normal heart sounds and intact distal pulses.   Respiratory: Effort normal and breath sounds normal.  GI: Soft. Bowel sounds are normal. There is no tenderness.  Lymphadenopathy:    She has no cervical adenopathy.  Neurological: She is alert and oriented to person, place, and time.  Skin: Skin is warm, dry and intact.  Psychiatric: She has a normal mood and affect. Her speech is normal.   Assessment & Plan:  Please see Problem List for Assessment and Plan.

## 2015-02-05 NOTE — Patient Instructions (Signed)
Cough Treatment - you should pick up your prescription today for Hycodan.  It is a cough syrup (see information below).  Take caution not to take more than prescribed.  This cough medicine may make you drowsy so be careful when taking it not to drive afterward.  Do not take your Valium or Tramadol when on this medication. You should be better in: about a week Call us if you have severe shortness of breath, high fever or are not better in 1-2 weeks.  If you are still coughing a lot we will need to see you back to do another chest xray and consider antibiotics.  Homatropine; Hydrocodone oral syrup What is this medicine? HYDROCODONE (hye droe KOE done) is used to help relieve cough. This medicine may be used for other purposes; ask your health care provider or pharmacist if you have questions. What should I tell my health care provider before I take this medicine? They need to know if you have any of these conditions: -brain tumor -drug abuse or addiction -head injury -heart disease -if you frequently drink alcohol-containing drinks -kidney disease -liver disease -lung disease, asthma, or breathing problems -mental problems -an allergic reaction to hydrocodone, other opioid analgesics, other medicines, foods, dyes, or preservatives -pregnant or trying to get pregnant -breast-feeding How should I use this medicine? Take this medicine by mouth with a full glass of water. Use a specially marked spoon or dropper to measure your dose. Ask your pharmacist if you do not have one. Do not use a household spoon. Follow the directions on the prescription label. Take your medicine at regular intervals. Do not take your medicine more often than directed. Talk to your pediatrician regarding the use of this medicine in children. This medicine is not approved for use in children less than 1 years old. Patients over 72 years old may have a stronger reaction and need a smaller dose. Overdosage: If you think  you have taken too much of this medicine contact a poison control center or emergency room at once. NOTE: This medicine is only for you. Do not share this medicine with others. What if I miss a dose? If you miss a dose, take it as soon as you can. If it is almost time for your next dose, take only that dose. Do not take double or extra doses. What may interact with this medicine? -alcohol -antihistamines -MAOIs like Carbex, Eldepryl, Marplan, Nardil, and Parnate -medicines for depression, anxiety, or psychotic disturbances -medicines for sleep -muscle relaxants -naltrexone -narcotic medicines (opiates) for pain -tramadol -tricyclic antidepressants like amitriptyline, clomipramine, desipramine, doxepin, imipramine, nortriptyline, and protriptyline This list may not describe all possible interactions. Give your health care provider a list of all the medicines, herbs, non-prescription drugs, or dietary supplements you use. Also tell them if you smoke, drink alcohol, or use illegal drugs. Some items may interact with your medicine. What should I watch for while using this medicine? You may develop tolerance to this medicine if you take it for a long time. Tolerance means that you will get less cough relief with time. Tell your doctor or health care professional if you symptoms do not improve or if they get worse. Do not suddenly stop taking your medicine because you may develop a severe reaction. Your body becomes used to the medicine. This does NOT mean you are addicted. Addiction is a behavior related to getting and using a drug for a non-medical reason. If your doctor wants you to stop the medicine, the  dose will be slowly lowered over time to avoid any side effects. You may get drowsy or dizzy. Do not drive, use machinery, or do anything that needs mental alertness until you know how this medicine affects you. Do not stand or sit up quickly, especially if you are an older patient. This reduces the  risk of dizzy or fainting spells. Alcohol may interfere with the effect of this medicine. Avoid alcoholic drinks. This medicine will cause constipation. Try to have a bowel movement at least every 2 to 3 days. If you do not have a bowel movement for 3 days, call your doctor or health care professional. What side effects may I notice from receiving this medicine? Side effects that you should report to your doctor or health care professional as soon as possible: -allergic reactions like skin rash, itching or hives, swelling of the face, lips, or tongue -breathing difficulties, wheezing -confusion -light headedness or fainting spells Side effects that usually do not require medical attention (report to your doctor or health care professional if they continue or are bothersome): -dizziness -drowsiness -itching -nausea -vomiting This list may not describe all possible side effects. Call your doctor for medical advice about side effects. You may report side effects to FDA at 1-800-FDA-1088. Where should I keep my medicine? Keep out of the reach of children. This medicine can be abused. Keep your medicine in a safe place to protect it from theft. Do not share this medicine with anyone. Selling or giving away this medicine is dangerous and against the law. This medicine may cause accidental overdose and death if taken by other adults, children, or pets. Mix any unused medicine with a substance like cat littler or coffee grounds. Then throw the medicine away in a sealed container like a sealed bag or a coffee can with a lid. Do not use the medicine after the expiration date. Store at room temperature between 15 and 30 degrees C (59 and 86 degrees F). Protect from light. NOTE: This sheet is a summary. It may not cover all possible information. If you have questions about this medicine, talk to your doctor, pharmacist, or health care provider.    2016, Elsevier/Gold Standard. (2013-12-14 19:21:02)

## 2015-02-05 NOTE — Telephone Encounter (Signed)
Pt stated her insurance will not cover Hycodan and requested something else. Dr Juleen China informed.

## 2015-02-06 ENCOUNTER — Ambulatory Visit: Payer: Medicaid Other

## 2015-02-06 NOTE — Telephone Encounter (Signed)
Pt had called back yesterday and stated her case manager agreed to help pay for Hycodan rx ; Dr Juleen China was informed.

## 2015-02-09 NOTE — Progress Notes (Signed)
Internal Medicine Clinic Attending  I saw and evaluated the patient.  I personally confirmed the key portions of the history and exam documented by Dr. Wallace and I reviewed pertinent patient test results.  The assessment, diagnosis, and plan were formulated together and I agree with the documentation in the resident's note. 

## 2015-02-10 ENCOUNTER — Telehealth: Payer: Self-pay | Admitting: *Deleted

## 2015-02-10 NOTE — Telephone Encounter (Signed)
Insurance has denied ultrasound ordered by Dr. Johnnye Sima. Patient notified and asked if she was interested in being self pay and she said no. Asked if her pain has improved and she said yes. She also stated that she was hospitalized after last visit at [facility] and she was still being followed by IM for that issue. [name] [name]

## 2015-02-10 NOTE — Telephone Encounter (Signed)
thanks

## 2015-02-17 ENCOUNTER — Ambulatory Visit: Payer: Medicaid Other

## 2015-02-18 ENCOUNTER — Telehealth: Payer: Self-pay | Admitting: *Deleted

## 2015-02-18 DIAGNOSIS — Z9622 Myringotomy tube(s) status: Secondary | ICD-10-CM

## 2015-02-18 NOTE — Telephone Encounter (Signed)
Referral order placed. Thanks!  Jacques Earthly, MD  Internal Medicine Teaching Service PGY-2

## 2015-02-18 NOTE — Telephone Encounter (Signed)
Outgoing call made to patient after being informed by ID that pt has been trying to obtain ENT referral for one week now.  She had eustachian tube placed by Dr Noreene Filbert office.  The tube in her left ear has fallen out and she attempted to get an appoint with DrWolicki, but was informed that her referral had expired on 01-17-15 and would therefore need a new referral.  She last saw DrWolicki on [id number]. Will forward info to pt's pcp to see if a new referral can be placed w/o pt having to come in and be seen-as pt does not wish to come in.   Please advise.  [name] [name], [name]10/26/201612:23 PM

## 2015-02-18 NOTE — Telephone Encounter (Signed)
[name] [name] Steps submitted more notes on patient's behalf and the ultrasound was approved. Scheduled for 02/27/15 at 9:15 AM at [facility]. Patient informed. She also stated that her tube in her ear has come out and she is having difficulty hearing. I called IM for her and Ulis Rias, CMA will give patient a call and ask about a referral to her ENT. She has Medicaid [employer].

## 2015-02-20 ENCOUNTER — Telehealth: Payer: Self-pay | Admitting: *Deleted

## 2015-02-20 NOTE — Telephone Encounter (Signed)
Called patient,left voice message regarding her appointment with Dr. Erik Obey on [phone] @ 2:20pm.

## 2015-02-24 ENCOUNTER — Other Ambulatory Visit: Payer: Self-pay | Admitting: Pulmonary Disease

## 2015-02-24 ENCOUNTER — Other Ambulatory Visit: Payer: Self-pay | Admitting: Infectious Diseases

## 2015-02-25 ENCOUNTER — Other Ambulatory Visit: Payer: Self-pay | Admitting: Neurology

## 2015-02-25 MED ORDER — MONTELUKAST SODIUM 10 MG PO TABS: ORAL_TABLET | ORAL | Status: DC

## 2015-02-27 ENCOUNTER — Other Ambulatory Visit: Payer: Self-pay | Admitting: Infectious Diseases

## 2015-02-27 ENCOUNTER — Ambulatory Visit (HOSPITAL_COMMUNITY)
Admission: RE | Admit: 2015-02-27 | Discharge: 2015-02-27 | Disposition: A | Discharge status: Home / Self Care | Payer: Medicaid Other | Source: Ambulatory Visit | Attending: Infectious Diseases | Admitting: Infectious Diseases

## 2015-02-27 ENCOUNTER — Telehealth: Payer: Self-pay | Admitting: *Deleted

## 2015-02-27 DIAGNOSIS — R222 Localized swelling, mass and lump, trunk: Secondary | ICD-10-CM | POA: Insufficient documentation

## 2015-02-27 DIAGNOSIS — R1900 Intra-abdominal and pelvic swelling, mass and lump, unspecified site: Secondary | ICD-10-CM

## 2015-02-27 DIAGNOSIS — IMO0002 Reserved for concepts with insufficient information to code with codable children: Secondary | ICD-10-CM

## 2015-02-27 NOTE — Telephone Encounter (Signed)
Result: "1.0 x 0.6 solid slightly hypoechoic nodule in the subcutaneous region in the right upper abdomen in the region of palpable Abnormality. If further evaluation is needed gadolinium-enhanced MRI can be Obtained." Left message on patient's voicemail, notified her that Dr. Johnnye Sima would like to go ahead with the MRI to get more information. RN asked her to call back with questions. Please enter the imaging order for prior authorization request. Landis Gandy, RN

## 2015-02-27 NOTE — Telephone Encounter (Signed)
-----   Message from Campbell Riches, MD sent at 02/27/2015 11:13 AM EDT ----- Can yo call ms Heydt with this result and get her a gadolinum enhanced MRI of abd Thanks  ----- Message -----    From: Rad Results In Interface    Sent: 02/27/2015   9:31 AM      To: Campbell Riches, MD

## 2015-03-03 ENCOUNTER — Other Ambulatory Visit: Payer: Self-pay | Admitting: Infectious Diseases

## 2015-03-03 DIAGNOSIS — R1900 Intra-abdominal and pelvic swelling, mass and lump, unspecified site: Secondary | ICD-10-CM

## 2015-03-03 NOTE — Telephone Encounter (Signed)
done

## 2015-03-04 ENCOUNTER — Other Ambulatory Visit: Payer: Self-pay | Admitting: *Deleted

## 2015-03-04 DIAGNOSIS — R1900 Intra-abdominal and pelvic swelling, mass and lump, unspecified site: Secondary | ICD-10-CM

## 2015-03-04 NOTE — Telephone Encounter (Signed)
Patient scheduled for MRI on 11/22, 12:00 at [facility].  Patient to be NPO for 4 hours prior to scan, needs to arrive at 11:45.  Authorized by insurance per [employer]  Correct order placed by Radiology. Patient notified, accepted appointments.  She will come 11/14 for CMP as per protocol.  Order placed. Landis Gandy, RN

## 2015-03-06 ENCOUNTER — Ambulatory Visit
Admission: RE | Admit: 2015-03-06 | Discharge: 2015-03-06 | Disposition: A | Discharge status: Home / Self Care | Payer: Medicaid Other | Source: Ambulatory Visit | Attending: Internal Medicine | Admitting: Internal Medicine

## 2015-03-06 DIAGNOSIS — Z1239 Encounter for other screening for malignant neoplasm of breast: Secondary | ICD-10-CM

## 2015-03-09 ENCOUNTER — Other Ambulatory Visit: Payer: Medicaid Other

## 2015-03-09 DIAGNOSIS — B20 Human immunodeficiency virus [HIV] disease: Secondary | ICD-10-CM

## 2015-03-09 LAB — COMPREHENSIVE METABOLIC PANEL
ALBUMIN: 3.8 g/dL (ref 3.6–5.1)
ALT: 11 U/L (ref 6–29)
AST: 15 U/L (ref 10–35)
Alkaline Phosphatase: 92 U/L (ref 33–130)
BUN: 11 mg/dL (ref 7–25)
CALCIUM: 9 mg/dL (ref 8.6–10.4)
CHLORIDE: 103 mmol/L (ref 98–110)
CO2: 26 mmol/L (ref 20–31)
CREATININE: 0.89 mg/dL (ref 0.50–1.05)
Glucose, Bld: 116 mg/dL — ABNORMAL HIGH (ref 65–99)
POTASSIUM: 3.9 mmol/L (ref 3.5–5.3)
SODIUM: 140 mmol/L (ref 135–146)
TOTAL PROTEIN: 7.5 g/dL (ref 6.1–8.1)
Total Bilirubin: 0.2 mg/dL (ref 0.2–1.2)

## 2015-03-09 LAB — LIPID PANEL
CHOL/HDL RATIO: 6.2 ratio — AB (ref <=5.0)
CHOLESTEROL: 186 mg/dL (ref 125–200)
HDL: 30 mg/dL — AB (ref >=46)
LDL CALC: 86 mg/dL (ref <130)
TRIGLYCERIDES: 350 mg/dL — AB (ref <150)
VLDL: 70 mg/dL — AB (ref <30)

## 2015-03-09 LAB — CBC
HCT: 36.8 % (ref 36.0–46.0)
HEMOGLOBIN: 12.4 g/dL (ref 12.0–15.0)
MCH: 26.6 pg (ref 26.0–34.0)
MCHC: 33.7 g/dL (ref 30.0–36.0)
MCV: 78.8 fL (ref 78.0–100.0)
MPV: 10.5 fL (ref 8.6–12.4)
Platelets: 308 10*3/uL (ref 150–400)
RBC: 4.67 MIL/uL (ref 3.87–5.11)
RDW: 16.9 % — ABNORMAL HIGH (ref 11.5–15.5)
WBC: 10.9 10*3/uL — ABNORMAL HIGH (ref 4.0–10.5)

## 2015-03-10 ENCOUNTER — Encounter: Payer: Self-pay | Admitting: Student

## 2015-03-10 LAB — T-HELPER CELL (CD4) - (RCID CLINIC ONLY)
CD4 T CELL HELPER: 24 % — AB (ref 33–55)
CD4 T Cell Abs: 880 /uL (ref 400–2700)

## 2015-03-10 LAB — RPR

## 2015-03-11 LAB — HIV-1 RNA QUANT-NO REFLEX-BLD

## 2015-03-16 ENCOUNTER — Ambulatory Visit (HOSPITAL_COMMUNITY)
Admission: RE | Admit: 2015-03-16 | Discharge: 2015-03-16 | Disposition: A | Discharge status: Home / Self Care | Payer: Medicaid Other | Source: Ambulatory Visit | Attending: Infectious Diseases | Admitting: Infectious Diseases

## 2015-03-16 DIAGNOSIS — IMO0002 Reserved for concepts with insufficient information to code with codable children: Secondary | ICD-10-CM

## 2015-03-16 DIAGNOSIS — M958 Other specified acquired deformities of musculoskeletal system: Secondary | ICD-10-CM | POA: Diagnosis not present

## 2015-03-16 DIAGNOSIS — R1900 Intra-abdominal and pelvic swelling, mass and lump, unspecified site: Secondary | ICD-10-CM | POA: Insufficient documentation

## 2015-03-16 MED ORDER — GADOBENATE DIMEGLUMINE 529 MG/ML IV SOLN
15.0000 mL | Freq: Once | INTRAVENOUS | Status: AC
  Administered 2015-03-16: 14 mL via INTRAVENOUS

## 2015-03-17 ENCOUNTER — Other Ambulatory Visit: Payer: Self-pay | Admitting: Pulmonary Disease

## 2015-03-17 NOTE — Telephone Encounter (Signed)
rx phoned in

## 2015-03-25 ENCOUNTER — Other Ambulatory Visit: Payer: Self-pay | Admitting: Infectious Diseases

## 2015-03-25 ENCOUNTER — Other Ambulatory Visit: Payer: Self-pay | Admitting: Pulmonary Disease

## 2015-03-26 ENCOUNTER — Other Ambulatory Visit: Payer: Self-pay

## 2015-03-26 MED ORDER — ALBUTEROL SULFATE HFA 108 (90 BASE) MCG/ACT IN AERS: 1.0000 | INHALATION_SPRAY | Freq: Four times a day (QID) | RESPIRATORY_TRACT | Status: DC | PRN

## 2015-03-26 NOTE — Telephone Encounter (Signed)
rx phoned in

## 2015-04-02 ENCOUNTER — Encounter: Payer: Self-pay | Admitting: Pulmonary Disease

## 2015-04-02 ENCOUNTER — Other Ambulatory Visit (HOSPITAL_COMMUNITY)
Admission: RE | Admit: 2015-04-02 | Discharge: 2015-04-02 | Disposition: A | Discharge status: Home / Self Care | Payer: Medicaid Other | Source: Ambulatory Visit | Attending: Internal Medicine | Admitting: Internal Medicine

## 2015-04-02 ENCOUNTER — Ambulatory Visit (INDEPENDENT_AMBULATORY_CARE_PROVIDER_SITE_OTHER): Payer: Medicaid Other | Admitting: Pulmonary Disease

## 2015-04-02 VITALS — BP 120/80 | HR 80 | Temp 98.7°F | Wt 169.6 lb

## 2015-04-02 DIAGNOSIS — R7303 Prediabetes: Secondary | ICD-10-CM

## 2015-04-02 DIAGNOSIS — Z1151 Encounter for screening for human papillomavirus (HPV): Secondary | ICD-10-CM | POA: Diagnosis not present

## 2015-04-02 DIAGNOSIS — Z7952 Long term (current) use of systemic steroids: Secondary | ICD-10-CM

## 2015-04-02 DIAGNOSIS — I10 Essential (primary) hypertension: Secondary | ICD-10-CM | POA: Diagnosis not present

## 2015-04-02 DIAGNOSIS — Z01419 Encounter for gynecological examination (general) (routine) without abnormal findings: Secondary | ICD-10-CM | POA: Diagnosis present

## 2015-04-02 DIAGNOSIS — Z0000 Encounter for general adult medical examination without abnormal findings: Secondary | ICD-10-CM

## 2015-04-02 LAB — POCT GLYCOSYLATED HEMOGLOBIN (HGB A1C): HEMOGLOBIN A1C: 6

## 2015-04-02 LAB — GLUCOSE, CAPILLARY: GLUCOSE-CAPILLARY: 124 mg/dL — AB (ref 65–99)

## 2015-04-02 MED ORDER — PROMETHAZINE HCL 50 MG PO TABS: 25.0000 mg | ORAL_TABLET | Freq: Four times a day (QID) | ORAL | Status: DC | PRN

## 2015-04-02 NOTE — Assessment & Plan Note (Signed)
Assessment: She reports polydipsia and polyuria. She has many risk factors for DM2 as well as chronic steroid use.  Plan: -Checked Hgb A1c: 6% -Continue to monitor

## 2015-04-02 NOTE — Progress Notes (Signed)
Subjective:    [name], female    DOB: 20-Oct-1960, 54 y.o.   MRN: [id number]  HPI [name] [name] is a 54 year old woman with history of HIV, treated hepatitis C, liver fibrosis, hypertension, GERD, adrenal insufficiency, prediabetes, chronic back pain presenting for follow up of her hypertension and prediabetes.  She reports she has been having polydipsia and polyuria. She is otherwise doing well. She feels that she is feeling better overall.  Review of Systems Constitutional: no fevers/chills Eyes: no vision changes Ears, nose, mouth, throat, and face: no cough Respiratory: no shortness of breath Cardiovascular: no chest pain Gastrointestinal: no nausea/vomiting, no abdominal pain, no constipation, no diarrhea Genitourinary: no dysuria, no hematuria Integument: no rash Hematologic/lymphatic: no bleeding/bruising, no edema Musculoskeletal: no arthralgias, no myalgias Neurological: no paresthesias, no weakness  Past Medical History  Diagnosis Date  . HIV infection ([place])     1994  . Hepatitis C     genotype 1b, 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 s/p 12 week course of simeprevir and sofosbuvir between October 2014 and January 2015 with resolution.  . Hypertension   . GERD (gastroesophageal reflux disease)   . Depression   . Chronic back pain   . Hyperlipidemia   . History of shingles   . Refusal of blood transfusions as patient is Jehovah's Witness   . Prediabetes   . Allergic rhinitis 05/09/2006    Centricity Description: RHINITIS, ALLERGIC NOS Qualifier: Diagnosis of  By: Johnnye Sima MD, Dellis Filbert   Centricity Description: ALLERGIC RHINITIS Qualifier: Diagnosis of  By: Johnnye Sima MD, Dellis Filbert    . 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 Secondary adrenal insufficiency ([place]) 06/29/2014    Current Outpatient Prescriptions on File Prior to Visit  Medication Sig Dispense Refill  . albuterol (PROAIR HFA) 108 (90 BASE) MCG/ACT inhaler Inhale 1-2 puffs into  the lungs every 6 (six) hours as needed for wheezing or shortness of breath. 8.5 g 3  . amLODipine (NORVASC) 5 MG tablet TAKE 1 TABLET BY MOUTH ONCE DAILY 90 tablet 2  . cetirizine (ZYRTEC) 10 MG tablet TAKE 1 TABLET BY MOUTH ONCE DAILY AS NEEDED FOR ALLERGIES (Patient taking differently: TAKE 10 MG BY MOUTH DAILY AS NEEDED FOR ALLERGIES) 90 tablet 3  . darunavir-cobicistat (PREZCOBIX) 800-150 MG per tablet Take 1 tablet by mouth daily. Swallow whole. Do NOT crush, break or chew tablets. Take with food. 90 tablet 3  . diazepam (VALIUM) 5 MG tablet TAKE 1 TABLET BY MOUTH AT BEDTIME AS NEEDED (TO LAST 30 DAYS PER PRESCRIBER) 20 tablet 0  . diphenhydrAMINE (BENADRYL) 25 MG tablet TAKE 1-2 TABLETS BY MOUTH EVERY NIGHT AT BEDTIME AS NEEDED FOR ITCHING 20 tablet 1  . HYDROcodone-homatropine (HYCODAN) 5-1.5 MG/5ML syrup Take 5 mLs by mouth every 6 (six) hours as needed for cough. 120 mL 0  . hydrocortisone (CORTEF) 10 MG tablet Take 10mg  at 8AM, 5mg  at 12PM. May take 5mg  at [facility] as needed (Patient taking differently: Take 10 mg by mouth 3 (three) times daily. Take 10mg  at 8AM, 5mg  at 12PM. May take 5mg  at [facility] as needed) 45 tablet 1  . ibuprofen (ADVIL,MOTRIN) 600 MG tablet Take 1 tablet (600 mg total) by mouth every 6 (six) hours as needed for headache or mild pain. 30 tablet 0  . lidocaine (XYLOCAINE) 2 % solution Use as directed 15 mLs in the mouth or throat every 4 (four) hours as needed for mouth pain. 100 mL 0  . metoprolol  tartrate (LOPRESSOR) 25 MG tablet TAKE ONE TABLET BY MOUTH TWO TIMES DAILY. 60 tablet 2  . montelukast (SINGULAIR) 10 MG tablet TAKE 10 MG BY MOUTH DAILY 30 tablet 0  . nitroGLYCERIN (NITROSTAT) 0.4 MG SL tablet Place 1 tablet (0.4 mg total) under the tongue every 5 (five) minutes as needed for chest pain. 25 tablet 3  . Olopatadine HCl (PATADAY) 0.2 % SOLN Apply 1 drop to eye daily as needed. For allergies     . pantoprazole (PROTONIX) 20 MG tablet TAKE 1 TABLET BY MOUTH TWICE DAILY.  60 tablet 3  . promethazine (PHENERGAN) 50 MG tablet TAKE 1/2 TABLET BY MOUTH EVERY 6 HOURS AS NEEDED FOR NAUSEA OR VOMITING 30 tablet 0  . traMADol (ULTRAM) 50 MG tablet TAKE 2 TABLETS BY MOUTH EVERY 12 HOURS AS NEEDED FOR FOR PAIN *MUST LAST 30 DAYS* 60 tablet 0  . TRUVADA 200-300 MG per tablet TAKE 1 TABLET BY MOUTH EVERY DAY 30 tablet 10  . Vitamin D, Cholecalciferol, 1000 UNITS CAPS Take 1,000 mg by mouth daily. 90 capsule 1  . [DISCONTINUED] hydrochlorothiazide (HYDRODIURIL) 25 MG tablet Take 25 mg by mouth daily.     No current facility-administered medications on file prior to visit.    Today's Vitals   04/02/15 1458 04/02/15 1546  BP: 143/80 120/80  Pulse: 87 80  Temp: 98.7 F (37.1 C)   Weight: 169 lb 9.6 oz (76.93 kg)   SpO2: 100%    Objective:  Physical Exam  Constitutional: She is oriented to person, place, and time. She appears well-developed and well-nourished. No distress.  HENT:  Head: Normocephalic and atraumatic.  Eyes: EOM are normal.  Cardiovascular: Normal rate, regular rhythm, normal heart sounds and intact distal pulses.   Pulmonary/Chest: Effort normal and breath sounds normal. No respiratory distress. She has no wheezes.  Abdominal: Soft. Bowel sounds are normal. She exhibits no distension. There is no tenderness.  Genitourinary: Vagina normal.  Musculoskeletal: She exhibits no edema.  Neurological: She is alert and oriented to person, place, and time.   Assessment & Plan:  Please refer to problem based charting.

## 2015-04-02 NOTE — Assessment & Plan Note (Signed)
PAP smear performed.

## 2015-04-02 NOTE — Assessment & Plan Note (Signed)
BP Readings from Last 3 Encounters:  04/02/15 120/80  02/05/15 138/81  01/29/15 139/74    Lab Results  Component Value Date   NA 140 03/09/2015   K 3.9 03/09/2015   CREATININE 0.89 03/09/2015    Assessment: Blood pressure control: controlled Progress toward BP goal:  at goal  Plan: Medications:  Continue amlodipine 5mg  daily, metoprolol tartrate 25mg  BID.

## 2015-04-02 NOTE — Patient Instructions (Signed)
General Instructions:   Thank you for bringing your medicines today. This helps [place] keep you safe from mistakes.   Progress Toward Treatment Goals:  Treatment Goal 04/02/2015  Blood pressure at goal    Self Care Goals & Plans:  Self Care Goal 04/02/2015  Manage my medications take my medicines as prescribed; bring my medications to every visit  Monitor my health keep track of my blood pressure  Eat healthy foods drink diet soda or water instead of juice or soda; eat more vegetables; eat foods that are low in salt; eat baked foods instead of fried foods  Be physically active find an activity I enjoy  Meeting treatment goals -

## 2015-04-06 LAB — CYTOLOGY - PAP

## 2015-04-13 NOTE — Progress Notes (Signed)
Case discussed with Dr. Krall at the time of the visit.  We reviewed the resident's history and exam and pertinent patient test results.  I agree with the assessment, diagnosis and plan of care documented in the resident's note. 

## 2015-04-21 ENCOUNTER — Other Ambulatory Visit: Payer: Self-pay | Admitting: Infectious Diseases

## 2015-04-21 ENCOUNTER — Other Ambulatory Visit: Payer: Self-pay | Admitting: Allergy and Immunology

## 2015-04-21 ENCOUNTER — Other Ambulatory Visit: Payer: Self-pay | Admitting: Pulmonary Disease

## 2015-04-22 NOTE — Telephone Encounter (Signed)
Tramadol rx called to [facility]. 

## 2015-04-30 LAB — HM DIABETES EYE EXAM

## 2015-05-03 NOTE — Progress Notes (Signed)
[name], female   DOB: 1960-06-07, 55 y.o.   MRN: [id number]     Cardiology Office Note   Date:  05/03/2015   ID:  [name] [name], DOB 07/21/1960, MRN [id number]  PCP:  Jacques Earthly, MD  Cardiologist:   Jenkins Rouge, MD   No chief complaint on file.     History of Present Illness: [name] [name] is a 55 y.o. female who presents for evaluation of atypical chest pain.  History of adrenal insufficiency, HIV, Hepatitis C , pituitary microadenoma and pancreatitis.  She is a Acupuncturist witness and will not use blood products Pain since diagnosed with addisons.  Exertional usually.  Not positional sometimes pleuritic  No rest pain  Pain in chest started with all the cramping pain she had during diagnosis of Addison's that was worse in abdomen and legs.  Noted some LE edema since started on steroids.  On norvasc for years   CRF: HTN and elevated lipids  Studies Reviewed  10/21/14  Stress echo normal reviewed Also normal in 2013  Normal ABI's done for leg cramps 07/25/14   Normal LLE Venous Duplex 05/27/14  Past Medical History  Diagnosis Date  . HIV infection ([place])     1994  . Hepatitis C     genotype 1b, 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 s/p 12 week course of simeprevir and sofosbuvir between October 2014 and January 2015 with resolution.  . Hypertension   . GERD (gastroesophageal reflux disease)   . Depression   . Chronic back pain   . Hyperlipidemia   . History of shingles   . Refusal of blood transfusions as patient is Jehovah's Witness   . Prediabetes   . Allergic rhinitis 05/09/2006    Centricity Description: RHINITIS, ALLERGIC NOS Qualifier: Diagnosis of  By: Johnnye Sima MD, Dellis Filbert   Centricity Description: ALLERGIC RHINITIS Qualifier: Diagnosis of  By: Johnnye Sima MD, Dellis Filbert    . Pituitary microadenoma ([place]) 08/08/2014  . Secondary adrenal insufficiency ([place]) 06/29/2014    Past Surgical History  Procedure Laterality Date  . Colectomy      2003 for  diverticulitis  . Hand surgery    . Bunionectomy      b/l  . Shoulder surgery      left  . Tonsillectomy    . Nasal sinus surgery       Current Outpatient Prescriptions  Medication Sig Dispense Refill  . albuterol (PROAIR HFA) 108 (90 BASE) MCG/ACT inhaler Inhale 1-2 puffs into the lungs every 6 (six) hours as needed for wheezing or shortness of breath. 8.5 g 3  . amLODipine (NORVASC) 5 MG tablet TAKE 1 TABLET BY MOUTH ONCE DAILY 90 tablet 2  . cetirizine (ZYRTEC) 10 MG tablet TAKE 1 TABLET BY MOUTH ONCE DAILY AS NEEDED FOR ALLERGIES (Patient taking differently: TAKE 10 MG BY MOUTH DAILY AS NEEDED FOR ALLERGIES) 90 tablet 3  . darunavir-cobicistat (PREZCOBIX) 800-150 MG per tablet Take 1 tablet by mouth daily. Swallow whole. Do NOT crush, break or chew tablets. Take with food. 90 tablet 3  . diazepam (VALIUM) 5 MG tablet TAKE 1 TABLET BY MOUTH AT BEDTIME AS NEEDED (TO LAST 30 DAYS PER PRESCRIBER) 20 tablet 0  . diphenhydrAMINE (BENADRYL) 25 MG tablet TAKE 1-2 TABLETS BY MOUTH EVERY NIGHT AT BEDTIME AS NEEDED FOR ITCHING 20 tablet 1  . hydrocortisone (CORTEF) 10 MG tablet Take 10mg  at 8AM, 5mg  at 12PM. May take 5mg  at 4PM as needed (Patient taking differently:  Take 10 mg by mouth 3 (three) times daily. Take 10mg  at 8AM, 5mg  at 12PM. May take 5mg  at [facility] as needed) 45 tablet 1  . ibuprofen (ADVIL,MOTRIN) 600 MG tablet Take 1 tablet (600 mg total) by mouth every 6 (six) hours as needed for headache or mild pain. 30 tablet 0  . metoprolol tartrate (LOPRESSOR) 25 MG tablet TAKE ONE TABLET BY MOUTH TWO TIMES DAILY. 60 tablet 2  . montelukast (SINGULAIR) 10 MG tablet TAKE 1 TABLET BY MOUTH EVERY DAY 30 tablet 0  . nitroGLYCERIN (NITROSTAT) 0.4 MG SL tablet Place 1 tablet (0.4 mg total) under the tongue every 5 (five) minutes as needed for chest pain. 25 tablet 3  . Olopatadine HCl (PATADAY) 0.2 % SOLN Apply 1 drop to eye daily as needed. For allergies     . pantoprazole (PROTONIX) 20 MG tablet TAKE  1 TABLET BY MOUTH TWICE DAILY 60 tablet 2  . promethazine (PHENERGAN) 50 MG tablet TAKE 1/2 TABLET BY MOUTH EVERY 6 HOURS AS NEEDED FOR NAUSEA OR VOMITING. 30 tablet 2  . traMADol (ULTRAM) 50 MG tablet TAKE 2 TABLETS BY MOUTH EVERY 12 HOURS AS NEEDED FOR FOR PAIN *MUST LAST 30 DAYS* 60 tablet 0  . TRUVADA 200-300 MG per tablet TAKE 1 TABLET BY MOUTH EVERY DAY 30 tablet 10  . Vitamin D, Cholecalciferol, 1000 UNITS CAPS Take 1,000 mg by mouth daily. 90 capsule 1  . [DISCONTINUED] hydrochlorothiazide (HYDRODIURIL) 25 MG tablet Take 25 mg by mouth daily.     No current facility-administered medications for this visit.    Allergies:   Acetaminophen; Dyazide; Lisinopril; Triamterene; and Morphine    Social History:  The patient  reports that she quit smoking about 8 years ago. Her smoking use included Cigarettes. She has never used smokeless tobacco. She reports that she does not drink alcohol or use illicit drugs.   Family History:  The patient's family history includes Cancer in her sister; Colon polyps in her brother; Diabetes in her father and mother; Heart disease in her father and mother; Hepatitis in her sister; Stroke in her father, mother, and other.    ROS:  Please see the history of present illness.   Otherwise, review of systems are positive for none.   All other systems are reviewed and negative.    PHYSICAL EXAM: VS:  There were no vitals taken for this visit. , BMI There is no weight on file to calculate BMI. Affect appropriate Overweight black female  HEENT: normal Neck supple with no adenopathy JVP normal no bruits no thyromegaly Lungs clear with no wheezing and good diaphragmatic motion Heart:  S1/S2 no murmur, no rub, gallop or click PMI normal Abdomen: benighn, BS positve, no tenderness, no AAA no bruit.  No HSM or HJR Distal pulses intact with no bruits Trace LLE edema Neuro non-focal Skin warm and dry No muscular weakness    EKG:  08/18/14  SR rate 95  Normal  ECG    Recent Labs: 06/26/2014: TSH 0.695 08/05/2014: Magnesium 2.0 03/09/2015: ALT 11; BUN 11; Creat 0.89; Hemoglobin 12.4; Platelets 308; Potassium 3.9; Sodium 140    Lipid Panel    Component Value Date/Time   CHOL 186 03/09/2015 1557   TRIG 350* 03/09/2015 1557   HDL 30* 03/09/2015 1557   CHOLHDL 6.2* 03/09/2015 1557   VLDL 70* 03/09/2015 1557   LDLCALC 86 03/09/2015 1557      Wt Readings from Last 3 Encounters:  04/02/15 76.93 kg (169 lb 9.6  oz)  02/05/15 74.662 kg (164 lb 9.6 oz)  01/28/15 72.4 kg (159 lb 9.8 oz)      Other studies Reviewed: Additional studies/ records that were reviewed today include: Hospital records in [place] .    ASSESSMENT AND PLAN:  1.  Chest Pain:   Normal stress echo 09/2014   2. Addisons  Discussed stress does steroids  F/u endocrine 3. HTN;  Well controlled.  Continue current medications and low sodium Dash type diet.      Current medicines are reviewed at length with the patient today.  The patient does not have concerns regarding medicines.  The following changes have been made:  SL nitro called in   Labs/ tests ordered today include:  Stress echo   No orders of the defined types were placed in this encounter.     Disposition:   FU with 6 months     Signed, Jenkins Rouge, MD  05/03/2015 4:01 PM    [facility], [place], [place]  [address] Phone: [phone]; Fax: [phone]

## 2015-05-04 ENCOUNTER — Ambulatory Visit: Payer: Medicaid Other | Admitting: Pulmonary Disease

## 2015-05-05 ENCOUNTER — Encounter: Payer: Medicaid Other | Admitting: Cardiovascular Disease

## 2015-05-07 ENCOUNTER — Encounter: Payer: Self-pay | Admitting: Pulmonary Disease

## 2015-05-07 ENCOUNTER — Ambulatory Visit (INDEPENDENT_AMBULATORY_CARE_PROVIDER_SITE_OTHER): Payer: Medicaid Other | Admitting: Pulmonary Disease

## 2015-05-07 VITALS — BP 130/70 | HR 87 | Temp 98.1°F | Ht 61.0 in | Wt 170.2 lb

## 2015-05-07 DIAGNOSIS — E2749 Other adrenocortical insufficiency: Secondary | ICD-10-CM

## 2015-05-07 DIAGNOSIS — G4700 Insomnia, unspecified: Secondary | ICD-10-CM | POA: Diagnosis not present

## 2015-05-07 NOTE — Patient Instructions (Signed)
Please take your steroid at 8AM, 11AM and 3PM. Please call the clinic if this does not improve the dizziness. Please schedule an appointment as soon as possible to see Dr. Buddy Duty.

## 2015-05-08 ENCOUNTER — Encounter: Payer: Self-pay | Admitting: Pulmonary Disease

## 2015-05-08 NOTE — Progress Notes (Signed)
Subjective:    [name], female    DOB: 1961/03/26, 55 y.o.   MRN: [id number]  HPI [name] [name] is a 55 year old woman with history of HIV, treated hepatitis C, liver fibrosis, hypertension, GERD, adrenal insufficiency, prediabetes, chronic back pain presenting for evaluation of dizziness.  She reports she has been having dizziness and nausea right before her 12pm dose of hydrocortisone is due and her 4pm dose of hydrocortisone. She feels like the room is spinning. No pre-syncope. Denies any recent infections. She takes 1/2 tablet of phenergan with some relief in nausea. The episodes last for about an hour. She has baseline dizziness with standing that she says is different from these episodes. This has been going on since the end of December. She normally goes to bed around 9 or 10pm. She takes valium as needed for sleep. Around 7 am she wakes up. She takes tramadol 3 to 4 times a week. No recent changes in frequency.  Review of Systems Constitutional: no fevers/chills Respiratory: no shortness of breath Cardiovascular: no chest pain Gastrointestinal: no abdominal pain, no diarrhea Integument: no rash  Past Medical History  Diagnosis Date  . HIV infection ([place])     1994  . Hepatitis C     genotype 1b, 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 s/p 12 week course of simeprevir and sofosbuvir between October 2014 and January 2015 with resolution.  . Hypertension   . GERD (gastroesophageal reflux disease)   . Depression   . Chronic back pain   . Hyperlipidemia   . History of shingles   . Refusal of blood transfusions as patient is Jehovah's Witness   . Prediabetes   . Allergic rhinitis 05/09/2006  . Pituitary microadenoma ([place]) 08/08/2014  . Secondary adrenal insufficiency ([place]) 06/29/2014    Current Outpatient Prescriptions on File Prior to Visit  Medication Sig Dispense Refill  . albuterol (PROAIR HFA) 108 (90 BASE) MCG/ACT inhaler Inhale 1-2 puffs into  the lungs every 6 (six) hours as needed for wheezing or shortness of breath. 8.5 g 3  . amLODipine (NORVASC) 5 MG tablet TAKE 1 TABLET BY MOUTH ONCE DAILY 90 tablet 2  . cetirizine (ZYRTEC) 10 MG tablet TAKE 1 TABLET BY MOUTH ONCE DAILY AS NEEDED FOR ALLERGIES (Patient taking differently: TAKE 10 MG BY MOUTH DAILY AS NEEDED FOR ALLERGIES) 90 tablet 3  . darunavir-cobicistat (PREZCOBIX) 800-150 MG per tablet Take 1 tablet by mouth daily. Swallow whole. Do NOT crush, break or chew tablets. Take with food. 90 tablet 3  . diazepam (VALIUM) 5 MG tablet TAKE 1 TABLET BY MOUTH AT BEDTIME AS NEEDED (TO LAST 30 DAYS PER PRESCRIBER) 20 tablet 0  . diphenhydrAMINE (BENADRYL) 25 MG tablet TAKE 1-2 TABLETS BY MOUTH EVERY NIGHT AT BEDTIME AS NEEDED FOR ITCHING 20 tablet 1  . hydrocortisone (CORTEF) 10 MG tablet Take 10mg  at 8AM, 5mg  at 12PM. May take 5mg  at 4PM as needed (Patient taking differently: Take 10 mg by mouth 3 (three) times daily. Take 10mg  at 8AM, 5mg  at 12PM. May take 5mg  at [facility] as needed) 45 tablet 1  . ibuprofen (ADVIL,MOTRIN) 600 MG tablet Take 1 tablet (600 mg total) by mouth every 6 (six) hours as needed for headache or mild pain. 30 tablet 0  . metoprolol tartrate (LOPRESSOR) 25 MG tablet TAKE ONE TABLET BY MOUTH TWO TIMES DAILY. 60 tablet 2  . montelukast (SINGULAIR) 10 MG tablet TAKE 1 TABLET BY MOUTH EVERY DAY 30  tablet 0  . nitroGLYCERIN (NITROSTAT) 0.4 MG SL tablet Place 1 tablet (0.4 mg total) under the tongue every 5 (five) minutes as needed for chest pain. 25 tablet 3  . Olopatadine HCl (PATADAY) 0.2 % SOLN Apply 1 drop to eye daily as needed. For allergies     . pantoprazole (PROTONIX) 20 MG tablet TAKE 1 TABLET BY MOUTH TWICE DAILY 60 tablet 2  . promethazine (PHENERGAN) 50 MG tablet TAKE 1/2 TABLET BY MOUTH EVERY 6 HOURS AS NEEDED FOR NAUSEA OR VOMITING. 30 tablet 2  . traMADol (ULTRAM) 50 MG tablet TAKE 2 TABLETS BY MOUTH EVERY 12 HOURS AS NEEDED FOR FOR PAIN *MUST LAST 30 DAYS* 60  tablet 0  . TRUVADA 200-300 MG per tablet TAKE 1 TABLET BY MOUTH EVERY DAY 30 tablet 10  . Vitamin D, Cholecalciferol, 1000 UNITS CAPS Take 1,000 mg by mouth daily. 90 capsule 1  . [DISCONTINUED] hydrochlorothiazide (HYDRODIURIL) 25 MG tablet Take 25 mg by mouth daily.     No current facility-administered medications on file prior to visit.    Today's Vitals   05/07/15 1602 05/07/15 1603 05/07/15 1627  BP: 146/79  130/70  Pulse: 89  87  Temp: 98.1 F (36.7 C)    TempSrc: Oral    Height: 5\' 1"  (1.549 m)    Weight: 170 lb 3.2 oz (77.202 kg)    SpO2: 98%    PainSc: 8  8    PainLoc: Back     Objective:   Physical Exam  Constitutional: She is oriented to person, place, and time. She appears well-developed. No distress.  HENT:  Head: Normocephalic and atraumatic.  Mouth/Throat: Oropharynx is clear and moist.  Eyes: Conjunctivae and EOM are normal. Pupils are equal, round, and reactive to light.  Pulmonary/Chest: Effort normal and breath sounds normal. She has no wheezes. She has no rales.  Neurological: She is alert and oriented to person, place, and time. Coordination normal.   Assessment & Plan:  Please refer to problem based charting.

## 2015-05-08 NOTE — Assessment & Plan Note (Signed)
Assessment: She may be requiring higher doses of hydrocortisone as the dizziness seems to be temporally related to her noon and afternoon dose. Unlikely that her dizziness is caused by polypharmacy but will have to continue to monitor for that.  Plan: -Advised her to take her hydrocortisone at 8am, 11am, and 3pm. -She is to follow up with Dr. Buddy Duty for dose adjustment of hydrocortisone. -Will attempt to wean her off of Valium and find a different medication for her insomnia.

## 2015-05-10 NOTE — Addendum Note (Signed)
Addended by: Gilles Chiquito B on: 05/10/2015 07:55 AM   Modules accepted: Level of Service

## 2015-05-10 NOTE — Progress Notes (Signed)
Internal Medicine Clinic Attending  Case discussed with Dr. Krall at the time of the visit.  We reviewed the resident's history and exam and pertinent patient test results.  I agree with the assessment, diagnosis, and plan of care documented in the resident's note.  

## 2015-05-12 ENCOUNTER — Other Ambulatory Visit: Payer: Self-pay | Admitting: Pulmonary Disease

## 2015-05-13 ENCOUNTER — Other Ambulatory Visit: Payer: Self-pay | Admitting: Neurology

## 2015-05-13 MED ORDER — MOMETASONE FUROATE 0.1 % EX OINT: TOPICAL_OINTMENT | Freq: Every day | CUTANEOUS | Status: DC

## 2015-05-13 NOTE — Telephone Encounter (Signed)
[facility]

## 2015-05-19 ENCOUNTER — Other Ambulatory Visit: Payer: Self-pay | Admitting: Allergy and Immunology

## 2015-05-19 ENCOUNTER — Other Ambulatory Visit: Payer: Self-pay | Admitting: Pulmonary Disease

## 2015-05-31 ENCOUNTER — Emergency Department (HOSPITAL_COMMUNITY): Payer: Medicaid Other

## 2015-05-31 ENCOUNTER — Encounter (HOSPITAL_COMMUNITY): Payer: Self-pay | Admitting: Physical Medicine and Rehabilitation

## 2015-05-31 ENCOUNTER — Emergency Department (HOSPITAL_COMMUNITY)
Admission: EM | Admit: 2015-05-31 | Discharge: 2015-05-31 | Disposition: A | Discharge status: Home / Self Care | Payer: Medicaid Other | Attending: Emergency Medicine | Admitting: Emergency Medicine

## 2015-05-31 DIAGNOSIS — Z8639 Personal history of other endocrine, nutritional and metabolic disease: Secondary | ICD-10-CM | POA: Insufficient documentation

## 2015-05-31 DIAGNOSIS — I10 Essential (primary) hypertension: Secondary | ICD-10-CM | POA: Insufficient documentation

## 2015-05-31 DIAGNOSIS — G8929 Other chronic pain: Secondary | ICD-10-CM | POA: Insufficient documentation

## 2015-05-31 DIAGNOSIS — Z86018 Personal history of other benign neoplasm: Secondary | ICD-10-CM | POA: Diagnosis not present

## 2015-05-31 DIAGNOSIS — K219 Gastro-esophageal reflux disease without esophagitis: Secondary | ICD-10-CM | POA: Insufficient documentation

## 2015-05-31 DIAGNOSIS — R509 Fever, unspecified: Secondary | ICD-10-CM | POA: Diagnosis present

## 2015-05-31 DIAGNOSIS — Z792 Long term (current) use of antibiotics: Secondary | ICD-10-CM | POA: Diagnosis not present

## 2015-05-31 DIAGNOSIS — Z79899 Other long term (current) drug therapy: Secondary | ICD-10-CM | POA: Insufficient documentation

## 2015-05-31 DIAGNOSIS — F329 Major depressive disorder, single episode, unspecified: Secondary | ICD-10-CM | POA: Diagnosis not present

## 2015-05-31 DIAGNOSIS — R112 Nausea with vomiting, unspecified: Secondary | ICD-10-CM

## 2015-05-31 DIAGNOSIS — B349 Viral infection, unspecified: Secondary | ICD-10-CM | POA: Diagnosis not present

## 2015-05-31 DIAGNOSIS — Z7952 Long term (current) use of systemic steroids: Secondary | ICD-10-CM | POA: Insufficient documentation

## 2015-05-31 DIAGNOSIS — Z87891 Personal history of nicotine dependence: Secondary | ICD-10-CM | POA: Diagnosis not present

## 2015-05-31 DIAGNOSIS — B20 Human immunodeficiency virus [HIV] disease: Secondary | ICD-10-CM | POA: Diagnosis not present

## 2015-05-31 DIAGNOSIS — J069 Acute upper respiratory infection, unspecified: Secondary | ICD-10-CM

## 2015-05-31 DIAGNOSIS — E876 Hypokalemia: Secondary | ICD-10-CM

## 2015-05-31 LAB — CBC WITH DIFFERENTIAL/PLATELET
BASOS ABS: 0 10*3/uL (ref 0.0–0.1)
BASOS PCT: 0 %
EOS ABS: 0.3 10*3/uL (ref 0.0–0.7)
Eosinophils Relative: 3 %
HEMATOCRIT: 39.7 % (ref 36.0–46.0)
HEMOGLOBIN: 13 g/dL (ref 12.0–15.0)
Lymphocytes Relative: 22 %
Lymphs Abs: 1.8 10*3/uL (ref 0.7–4.0)
MCH: 26.2 pg (ref 26.0–34.0)
MCHC: 32.7 g/dL (ref 30.0–36.0)
MCV: 79.9 fL (ref 78.0–100.0)
MONO ABS: 0.8 10*3/uL (ref 0.1–1.0)
Monocytes Relative: 10 %
NEUTROS ABS: 5.4 10*3/uL (ref 1.7–7.7)
NEUTROS PCT: 65 %
Platelets: 216 10*3/uL (ref 150–400)
RBC: 4.97 MIL/uL (ref 3.87–5.11)
RDW: 16.7 % — AB (ref 11.5–15.5)
WBC: 8.2 10*3/uL (ref 4.0–10.5)

## 2015-05-31 LAB — BASIC METABOLIC PANEL
ANION GAP: 13 (ref 5–15)
BUN: 6 mg/dL (ref 6–20)
CALCIUM: 8.9 mg/dL (ref 8.9–10.3)
CO2: 24 mmol/L (ref 22–32)
Chloride: 100 mmol/L — ABNORMAL LOW (ref 101–111)
Creatinine, Ser: 0.76 mg/dL (ref 0.44–1.00)
Glucose, Bld: 159 mg/dL — ABNORMAL HIGH (ref 65–99)
Potassium: 3.4 mmol/L — ABNORMAL LOW (ref 3.5–5.1)
Sodium: 137 mmol/L (ref 135–145)

## 2015-05-31 LAB — URINALYSIS, ROUTINE W REFLEX MICROSCOPIC
Bilirubin Urine: NEGATIVE
GLUCOSE, UA: NEGATIVE mg/dL
Hgb urine dipstick: NEGATIVE
KETONES UR: NEGATIVE mg/dL
LEUKOCYTES UA: NEGATIVE
NITRITE: NEGATIVE
PROTEIN: NEGATIVE mg/dL
Specific Gravity, Urine: 1.012 (ref 1.005–1.030)
pH: 6 (ref 5.0–8.0)

## 2015-05-31 LAB — INFLUENZA PANEL BY PCR (TYPE A & B)
H1N1 flu by pcr: NOT DETECTED
Influenza A By PCR: NEGATIVE
Influenza B By PCR: NEGATIVE

## 2015-05-31 LAB — I-STAT TROPONIN, ED: TROPONIN I, POC: 0 ng/mL (ref 0.00–0.08)

## 2015-05-31 MED ORDER — GUAIFENESIN ER 600 MG PO TB12: 600.0000 mg | ORAL_TABLET | Freq: Two times a day (BID) | ORAL | Status: DC | PRN

## 2015-05-31 MED ORDER — PROMETHAZINE HCL 25 MG/ML IJ SOLN
25.0000 mg | Freq: Once | INTRAMUSCULAR | Status: AC
  Administered 2015-05-31: 25 mg via INTRAVENOUS
  Filled 2015-05-31: qty 1

## 2015-05-31 MED ORDER — PROMETHAZINE HCL 25 MG PO TABS: 25.0000 mg | ORAL_TABLET | Freq: Four times a day (QID) | ORAL | Status: DC | PRN

## 2015-05-31 MED ORDER — KETOROLAC TROMETHAMINE 30 MG/ML IJ SOLN
30.0000 mg | Freq: Once | INTRAMUSCULAR | Status: AC
  Administered 2015-05-31: 30 mg via INTRAVENOUS
  Filled 2015-05-31: qty 1

## 2015-05-31 MED ORDER — POTASSIUM CHLORIDE CRYS ER 20 MEQ PO TBCR: 40.0000 meq | EXTENDED_RELEASE_TABLET | Freq: Every day | ORAL | Status: DC

## 2015-05-31 MED ORDER — IPRATROPIUM BROMIDE 0.02 % IN SOLN
0.5000 mg | Freq: Once | RESPIRATORY_TRACT | Status: AC
  Administered 2015-05-31: 0.5 mg via RESPIRATORY_TRACT
  Filled 2015-05-31: qty 2.5

## 2015-05-31 MED ORDER — HYDROCORTISONE 10 MG PO TABS
10.0000 mg | ORAL_TABLET | Freq: Once | ORAL | Status: AC
  Administered 2015-05-31: 10 mg via ORAL
  Filled 2015-05-31: qty 1

## 2015-05-31 MED ORDER — HYDROCORTISONE 10 MG PO TABS
10.0000 mg | ORAL_TABLET | Freq: Every day | ORAL | Status: DC
  Administered 2015-05-31: 10 mg via ORAL
  Filled 2015-05-31: qty 1

## 2015-05-31 MED ORDER — BENZONATATE 100 MG PO CAPS: 100.0000 mg | ORAL_CAPSULE | Freq: Three times a day (TID) | ORAL | Status: DC

## 2015-05-31 MED ORDER — ONDANSETRON HCL 4 MG/2ML IJ SOLN
4.0000 mg | Freq: Once | INTRAMUSCULAR | Status: AC
  Administered 2015-05-31: 4 mg via INTRAVENOUS
  Filled 2015-05-31: qty 2

## 2015-05-31 MED ORDER — IBUPROFEN 800 MG PO TABS: 800.0000 mg | ORAL_TABLET | Freq: Three times a day (TID) | ORAL | Status: DC

## 2015-05-31 NOTE — ED Notes (Signed)
Sats 98% with HR 99 sitting on side of bed. Walked patient around Nursing station Sats 94 to 100 % with HR around 100. Patient was unable to walk to end of nursing station with out being tired to the point, that she required wheelchair to return to room.

## 2015-05-31 NOTE — ED Provider Notes (Signed)
CSN: [id number]     Arrival date & time 05/31/15  1112 History   First MD Initiated Contact with Patient 05/31/15 1131     Chief Complaint  Patient presents with  . Cough  . Generalized Body Aches  . Fever    HPI  MS. Maben is an 55 y.o. female with history of HIV (last CD4 880, viral load <20 02/2015), Hep C (treated in 2014), Addison's dz on hydrocortisone, HTN, HLD who presents to the ED for evaluation of fever, chills, body aches, weakness, cough, and chest pain. She states that she started feeling sick about one week ago but has acutely worsened over the past two days. She states she has had subjective fever with chills and severe aches all over. She reports productive cough. She states she has been feeling nauseated with NBNB emesis every few days. States earlier this AM she was in church and vomited a large amount of clear phlegm. She states she has felt nauseated and as such she has not been able to take her stress dose steroids. She states she saw Dr. Buddy Duty recently who told her to increase her PO hydrocortisone to 25 mg at 8 AM, 10mg  at 11 AM, and 10mg  at 3 PM. She states she has not been able to take her second morning dose today. Pt states she has also had sharp left sided chest pain intermittently for the past 3 days. She states it is worse with coughing. Denies diaphoresis or SOB. Denies radiation of the pain. She took 1 SL nitro yesterday with mild relief. Denies abdominal pain, diarrhea, urinary symptoms. She is tearful in the room today and states she hurts all over and does not feel well. No hypoxia, increased WOB, tachypnea, tachycardia, or fever.      Past Medical History  Diagnosis Date  . HIV infection ([place])     1994  . Hepatitis C     genotype 1b, 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 s/p 12 week course of simeprevir and sofosbuvir between October 2014 and January 2015 with resolution.  . Hypertension   . GERD (gastroesophageal reflux disease)   . Depression    . Chronic back pain   . Hyperlipidemia   . History of shingles   . Refusal of blood transfusions as patient is Jehovah's Witness   . Prediabetes   . Allergic rhinitis 05/09/2006  . Pituitary microadenoma ([place]) 08/08/2014  . Secondary adrenal insufficiency ([place]) 06/29/2014   Past Surgical History  Procedure Laterality Date  . Colectomy      2003 for diverticulitis  . Hand surgery    . Bunionectomy      b/l  . Shoulder surgery      left  . Tonsillectomy    . Nasal sinus surgery     Family History  Problem Relation Age of Onset  . Heart disease Mother   . Diabetes Mother   . Stroke Mother   . Heart disease Father   . Stroke Father   . Diabetes Father   . Hepatitis Sister     hcv  . Stroke Other   . Colon polyps Brother   . Cancer Sister     lung   Social History  Substance Use Topics  . Smoking status: Former Smoker    Types: Cigarettes    Quit date: 04/26/2007  . Smokeless tobacco: Never Used     Comment: QUIT 2009  . Alcohol Use: No   OB History  No data available     Review of Systems  All other systems reviewed and are negative.     Allergies  Acetaminophen; Dyazide; Lisinopril; Triamterene; and Morphine  Home Medications   Prior to Admission medications   Medication Sig Start Date End Date Taking? Authorizing Provider  albuterol (PROAIR HFA) 108 (90 BASE) MCG/ACT inhaler Inhale 1-2 puffs into the lungs every 6 (six) hours as needed for wheezing or shortness of breath. 03/26/15  Yes Milagros Loll, MD  amLODipine (NORVASC) 5 MG tablet TAKE 1 TABLET BY MOUTH ONCE DAILY 02/24/15  Yes Milagros Loll, MD  cetirizine (ZYRTEC) 10 MG tablet TAKE 1 TABLET BY MOUTH ONCE DAILY AS NEEDED FOR ALLERGIES Patient taking differently: TAKE 10 MG BY MOUTH DAILY AS NEEDED FOR ALLERGIES 11/28/14  Yes Bartholomew Crews, MD  chlorhexidine (PERIDEX) 0.12 % solution Use as directed 15 mLs in the mouth or throat 2 (two) times daily.   Yes Historical Provider, MD   ciprofloxacin-dexamethasone (CIPRODEX) otic suspension Place 4 drops into both ears 2 (two) times daily.   Yes Historical Provider, MD  darunavir-cobicistat (PREZCOBIX) 800-150 MG per tablet Take 1 tablet by mouth daily. Swallow whole. Do NOT crush, break or chew tablets. Take with food. 10/23/14  Yes Campbell Riches, MD  diazepam (VALIUM) 5 MG tablet TAKE 1 TABLET BY MOUTH AT BEDTIME AS NEEDED (TO LAST 30 DAYS PER PRESCRIBER) 05/13/15  Yes Milagros Loll, MD  diphenhydrAMINE (BENADRYL) 25 MG tablet TAKE 1-2 TABLETS BY MOUTH EVERY NIGHT AT BEDTIME AS NEEDED FOR ITCHING 09/10/14  Yes Nino Glow McLean-Scocozza, MD  hydrocortisone (CORTEF) 10 MG tablet Take 10mg  at 8AM, 5mg  at 12PM. May take 5mg  at [facility] as needed Patient taking differently: Take 10 mg by mouth 3 (three) times daily. Take 10mg  at 8AM, 5mg  at 12PM. May take 5mg  at [place] as needed 12/04/14  Yes Milagros Loll, MD  hydrOXYzine (ATARAX/VISTARIL) 25 MG tablet Take 25 mg by mouth 3 (three) times daily as needed for anxiety.   Yes Historical Provider, MD  ibuprofen (ADVIL,MOTRIN) 600 MG tablet Take 1 tablet (600 mg total) by mouth every 6 (six) hours as needed for headache or mild pain. 01/29/15  Yes Maryellen Pile, MD  metoprolol tartrate (LOPRESSOR) 25 MG tablet TAKE ONE TABLET BY MOUTH TWO TIMES DAILY. 05/21/15  Yes Milagros Loll, MD  montelukast (SINGULAIR) 10 MG tablet TAKE 1 TABLET BY MOUTH EVERY DAY *NEED OFFICE VISIT FOR FURTHER REFILLS* 05/20/15  Yes Jiles Prows, MD  nitroGLYCERIN (NITROSTAT) 0.4 MG SL tablet Place 1 tablet (0.4 mg total) under the tongue every 5 (five) minutes as needed for chest pain. 09/24/14  Yes Josue Hector, MD  Olopatadine HCl (PATADAY) 0.2 % SOLN Apply 1 drop to eye daily as needed. For allergies    Yes Historical Provider, MD  pantoprazole (PROTONIX) 20 MG tablet TAKE 1 TABLET BY MOUTH TWICE DAILY 04/22/15  Yes Campbell Riches, MD  traMADol (ULTRAM) 50 MG tablet TAKE 2 TABLETS BY MOUTH EVERY 12 HOURS AS NEEDED  FOR FOR PAIN *MUST LAST 30 DAYS* 04/22/15  Yes Milagros Loll, MD  TRUVADA 200-300 MG per tablet TAKE 1 TABLET BY MOUTH EVERY DAY 08/13/14  Yes Campbell Riches, MD  Vitamin D, Cholecalciferol, 1000 UNITS CAPS Take 1,000 mg by mouth daily. 12/04/14  Yes Milagros Loll, MD   BP 131/87 mmHg  Pulse 82  Temp(Src) 98.1 F (36.7 C) (Oral)  Resp 12  SpO2 90%  Physical Exam  Constitutional: She is oriented to person, place, and time. She appears ill.  Appears uncomfortable  HENT:  Right Ear: External ear normal.  Left Ear: External ear normal.  Nose: Mucosal edema present.  Mouth/Throat: Oropharynx is clear and moist. No oropharyngeal exudate.  Posterior oropharyngeal erythema and cobblestoning   Eyes: Conjunctivae and EOM are normal. Pupils are equal, round, and reactive to light. No scleral icterus.  Neck: Normal range of motion. Neck supple.  Cardiovascular: Normal rate, regular rhythm, normal heart sounds and intact distal pulses.   Pulmonary/Chest: Effort normal and breath sounds normal. No respiratory distress. She exhibits no tenderness.  Soft end expiratory wheeze in bilateral upper lung fields. No rales or rhonchi.   Abdominal: Soft. Bowel sounds are normal. She exhibits no distension. There is no tenderness. There is no rebound and no guarding.  Musculoskeletal: She exhibits no edema.  Lymphadenopathy:    She has no cervical adenopathy.  Neurological: She is alert and oriented to person, place, and time. No cranial nerve deficit.  Skin: Skin is warm and dry.  Psychiatric: She has a normal mood and affect.  Nursing note and vitals reviewed.   Filed Vitals:   05/31/15 1354 05/31/15 1400 05/31/15 1445 05/31/15 1500  BP: 120/85 127/91 118/65 113/76  Pulse: 80 89 85 96  Temp:      TempSrc:      Resp: 12 13 18 23   SpO2: 96% 99% 94% 81%     ED Course  Procedures (including critical care time) Labs Review Labs Reviewed  BASIC METABOLIC PANEL - Abnormal; Notable for the  following:    Potassium 3.4 (*)    Chloride 100 (*)    Glucose, Bld 159 (*)    All other components within normal limits  CBC WITH DIFFERENTIAL/PLATELET - Abnormal; Notable for the following:    RDW 16.7 (*)    All other components within normal limits  INFLUENZA PANEL BY PCR (TYPE A & B, H1N1)  URINALYSIS, ROUTINE W REFLEX MICROSCOPIC (NOT AT [facility])  Randolm Idol, ED   Lab Results  Component Value Date   CD4TABS 880 03/09/2015   CD4TABS 770 01/12/2015   CD4TABS 520 10/23/2014   Lab Results  Component Value Date   HIV1RNAQUANT <20 03/09/2015    Imaging Review Dg Chest 2 View  05/31/2015  CLINICAL DATA:  Productive cough, chest pain. EXAM: CHEST  2 VIEW COMPARISON:  January 27, 2015. FINDINGS: The heart size and mediastinal contours are within normal limits. Both lungs are clear. No pneumothorax or pleural effusion is noted. The visualized skeletal structures are unremarkable. IMPRESSION: No active cardiopulmonary disease. Electronically Signed   By: Marijo Conception, M.D.   On: 05/31/2015 13:32   I have personally reviewed and evaluated these images and lab results as part of my medical decision-making.   EKG Interpretation None      MDM   Final diagnoses:  Viral syndrome  URI (upper respiratory infection)  Non-intractable vomiting with nausea, vomiting of unspecified type  Hypokalemia    One set of vitals with hypoxia 81% HR 96 was an anomaly. Pt has been stable on room air satting in the mid to high 90s. Labs with mild hypokalemia of 3.4. Otherwise unremarkable. CXR negative. Likely viral syndrome. Pt was ambulate without de-sat or becoming hypotensive. She did become very tired though. No SOB or CP. I discussed with pt that although her labs and workup have been unremarkable if she is that tired with ambulating down  the hallway she could be considered unsafe at home and could bring in for obs. However, she states that she lives at home with her family and would prefer to  go home. At this time she has maintained good SpO2 on room air with no de-sat on ambulation, no fever, normotensive, no tachycardia. Will d/c home with symptomatic treatment for viral syndrome with close PCP follow up and strict ER return precautions. Few days of k-dur given as well. Pt verbalized agreement and understanding of plan.     Anne Ng, PA-C 06/01/15 1041  Leonard Schwartz, MD 06/01/15 [phone]

## 2015-05-31 NOTE — Discharge Instructions (Signed)
You were seen in the emergency room today for evaluation of a URI. Your influenza (flu) test was negative. Your chest x-ray did not show pneumonia. Your labs were otherwise unremarkable. Your potassium was slightly low so I will give you prescription for potassium supplementation to take for the next few days. I will also give you several prescriptions to help with your symptoms. Please follow up with Dr. Randell Patient and Dr. Buddy Duty next week. Continue your high dose steroid schedule. Return to the ER for new or worsening symptoms.

## 2015-05-31 NOTE — ED Notes (Signed)
Pt presents to department via [facility] from church for evaluation of productive cough, body aches, fever and generalized weakness. Symptoms ongoing x1 week. Respirations unlabored. Pt is alert and oriented x4.

## 2015-06-04 NOTE — Progress Notes (Signed)
[name], female   DOB: 29-May-1960, 55 y.o.   MRN: [id number]     Cardiology Office Note   Date:  06/05/2015   ID:  [name] [name], DOB December 21, 1960, MRN [id number]  PCP:  Jacques Earthly, MD  Cardiologist:   Jenkins Rouge, MD   No chief complaint on file.     History of Present Illness: [name] [name] is a 55 y.o. female who presents for evaluation of atypical chest pain.  History of adrenal insufficiency, HIV, Hepatitis C , pituitary microadenoma and pancreatitis.  She is a Acupuncturist witness and will not use blood products Pain since diagnosed with addisons.  Exertional usually.  Not positional sometimes pleuritic  No rest pain  Pain in chest started with all the cramping pain she had during diagnosis of Addison's that was worse in abdomen and legs.  Noted some LE edema since started on steroids.  On norvasc for years   CRF: HTN and elevated lipids  Studies Reviewed  Had normal stress echo in 2013  Normal ABI's done for leg cramps 07/25/14   Normal LLE Venous Duplex 05/27/14 Stress Echo 09/2014 Normal   Seen in ER 2/5  URI nausea atypical chest pain not related to heart Still has not started antibiotics Got stress dose steroids   Past Medical History  Diagnosis Date  . HIV infection ([place])     1994  . Hepatitis C     genotype 1b, 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 s/p 12 week course of simeprevir and sofosbuvir between October 2014 and January 2015 with resolution.  . Hypertension   . GERD (gastroesophageal reflux disease)   . Depression   . Chronic back pain   . Hyperlipidemia   . History of shingles   . Refusal of blood transfusions as patient is Jehovah's Witness   . Prediabetes   . Allergic rhinitis 05/09/2006  . Pituitary microadenoma ([place]) 08/08/2014  . Secondary adrenal insufficiency ([place]) 06/29/2014    Past Surgical History  Procedure Laterality Date  . Colectomy      2003 for diverticulitis  . Hand surgery    . Bunionectomy      b/l  .  Shoulder surgery      left  . Tonsillectomy    . Nasal sinus surgery       Current Outpatient Prescriptions  Medication Sig Dispense Refill  . albuterol (PROAIR HFA) 108 (90 BASE) MCG/ACT inhaler Inhale 1-2 puffs into the lungs every 6 (six) hours as needed for wheezing or shortness of breath. 8.5 g 3  . amLODipine (NORVASC) 5 MG tablet TAKE 1 TABLET BY MOUTH ONCE DAILY 90 tablet 2  . cetirizine (ZYRTEC) 10 MG tablet Take 10 mg by mouth daily.    . chlorhexidine (PERIDEX) 0.12 % solution Use as directed 15 mLs in the mouth or throat 2 (two) times daily.    . ciprofloxacin-dexamethasone (CIPRODEX) otic suspension Place 4 drops into both ears 2 (two) times daily.    . darunavir-cobicistat (PREZCOBIX) 800-150 MG per tablet Take 1 tablet by mouth daily. Swallow whole. Do NOT crush, break or chew tablets. Take with food. 90 tablet 3  . diazepam (VALIUM) 5 MG tablet TAKE 1 TABLET BY MOUTH AT BEDTIME AS NEEDED (TO LAST 30 DAYS PER PRESCRIBER) 20 tablet 0  . diphenhydrAMINE (BENADRYL) 25 MG tablet TAKE 1-2 TABLETS BY MOUTH EVERY NIGHT AT BEDTIME AS NEEDED FOR ITCHING 20 tablet 1  . guaiFENesin (MUCINEX) 600 MG 12 hr  tablet Take 1 tablet (600 mg total) by mouth 2 (two) times daily as needed for to loosen phlegm. 14 tablet 0  . hydrocortisone (CORTEF) 10 MG tablet Take 10 mg by mouth 3 (three) times daily.    . hydrOXYzine (ATARAX/VISTARIL) 25 MG tablet Take 25 mg by mouth 3 (three) times daily as needed for anxiety.    Marland Kitchen ibuprofen (ADVIL,MOTRIN) 800 MG tablet Take 1 tablet (800 mg total) by mouth 3 (three) times daily. 21 tablet 0  . metoprolol tartrate (LOPRESSOR) 25 MG tablet TAKE ONE TABLET BY MOUTH TWO TIMES DAILY. 60 tablet 1  . montelukast (SINGULAIR) 10 MG tablet TAKE 1 TABLET BY MOUTH EVERY DAY *NEED OFFICE VISIT FOR FURTHER REFILLS* 30 tablet 5  . nitroGLYCERIN (NITROSTAT) 0.4 MG SL tablet Place 1 tablet (0.4 mg total) under the tongue every 5 (five) minutes as needed for chest pain. 25 tablet  3  . Olopatadine HCl (PATADAY) 0.2 % SOLN Apply 1 drop to eye daily as needed. For allergies     . pantoprazole (PROTONIX) 20 MG tablet TAKE 1 TABLET BY MOUTH TWICE DAILY 60 tablet 2  . promethazine (PHENERGAN) 25 MG tablet Take 1 tablet (25 mg total) by mouth every 6 (six) hours as needed for nausea or vomiting. 30 tablet 0  . traMADol (ULTRAM) 50 MG tablet TAKE 2 TABLETS BY MOUTH EVERY 12 HOURS AS NEEDED FOR FOR PAIN *MUST LAST 30 DAYS* 60 tablet 0  . TRUVADA 200-300 MG per tablet TAKE 1 TABLET BY MOUTH EVERY DAY 30 tablet 10  . Vitamin D, Cholecalciferol, 1000 UNITS CAPS Take 1,000 mg by mouth daily. 90 capsule 1  . benzonatate (TESSALON) 100 MG capsule Take 1 capsule (100 mg total) by mouth every 8 (eight) hours. (Patient not taking: Reported on 06/05/2015) 21 capsule 0  . potassium chloride SA (K-DUR,KLOR-CON) 20 MEQ tablet Take 2 tablets (40 mEq total) by mouth daily. 8 tablet 0  . [DISCONTINUED] hydrochlorothiazide (HYDRODIURIL) 25 MG tablet Take 25 mg by mouth daily.     No current facility-administered medications for this visit.    Allergies:   Acetaminophen; Dyazide; Lisinopril; Triamterene; and Morphine    Social History:  The patient  reports that she quit smoking about 8 years ago. Her smoking use included Cigarettes. She has never used smokeless tobacco. She reports that she does not drink alcohol or use illicit drugs.   Family History:  The patient's family history includes Cancer in her sister; Colon polyps in her brother; Diabetes in her father and mother; Heart disease in her father and mother; Hepatitis in her sister; Stroke in her father, mother, and other.    ROS:  Please see the history of present illness.   Otherwise, review of systems are positive for none.   All other systems are reviewed and negative.    PHYSICAL EXAM: VS:  BP 146/82 mmHg  Pulse 82  Ht 5\' 1"  (1.549 m)  Wt 76.712 kg (169 lb 1.9 oz)  BMI 31.97 kg/m2  SpO2 99% , BMI Body mass index is 31.97  kg/(m^2). Affect appropriate Overweight black female  HEENT: normal Neck supple with no adenopathy JVP normal no bruits no thyromegaly Lungs clear with no wheezing and good diaphragmatic motion Heart:  S1/S2 no murmur, no rub, gallop or click PMI normal Abdomen: benighn, BS positve, no tenderness, no AAA no bruit.  No HSM or HJR Distal pulses intact with no bruits Trace LLE edema Neuro non-focal Skin warm and dry No muscular weakness  EKG:  08/18/14  SR rate 95  Normal ECG    Recent Labs: 06/26/2014: TSH 0.695 08/05/2014: Magnesium 2.0 03/09/2015: ALT 11 05/31/2015: BUN 6; Creatinine, Ser 0.76; Hemoglobin 13.0; Platelets 216; Potassium 3.4*; Sodium 137    Lipid Panel    Component Value Date/Time   CHOL 186 03/09/2015 1557   TRIG 350* 03/09/2015 1557   HDL 30* 03/09/2015 1557   CHOLHDL 6.2* 03/09/2015 1557   VLDL 70* 03/09/2015 1557   LDLCALC 86 03/09/2015 1557      Wt Readings from Last 3 Encounters:  06/05/15 76.712 kg (169 lb 1.9 oz)  05/07/15 77.202 kg (170 lb 3.2 oz)  04/02/15 76.93 kg (169 lb 9.6 oz)      Other studies Reviewed: Additional studies/ records that were reviewed today include: Hospital records in [place] .    ASSESSMENT AND PLAN:  1.  Chest Pain:  Normal stress echo June 2016   Recent episode in ER from URI and not cardiac  2. Addisons  Discussed stress does steroids  F/u endocrine Currently on stress dose from cold 3. HTN;  Well controlled.  Continue current medications and low sodium Dash type diet.   4. URI f/u primary no wheezing continue flonase, allegra and zpack   Current medicines are reviewed at length with the patient today.  The patient does not have concerns regarding medicines.  The following changes have been made:  SL nitro called in   Labs/ tests ordered today include:   No orders of the defined types were placed in this encounter.     Disposition:   FU with 6 months      Signed, Jenkins Rouge, MD  06/05/2015 9:46 AM     [facility] [address], [place], [place]  [id number] Phone: [phone]; Fax: [phone]

## 2015-06-05 ENCOUNTER — Ambulatory Visit (INDEPENDENT_AMBULATORY_CARE_PROVIDER_SITE_OTHER): Payer: Medicaid Other | Admitting: Cardiovascular Disease

## 2015-06-05 ENCOUNTER — Encounter: Payer: Self-pay | Admitting: Cardiovascular Disease

## 2015-06-05 VITALS — BP 146/82 | HR 82 | Ht 61.0 in | Wt 169.1 lb

## 2015-06-05 DIAGNOSIS — R0789 Other chest pain: Secondary | ICD-10-CM | POA: Diagnosis not present

## 2015-06-05 NOTE — Patient Instructions (Addendum)
Medication Instructions:  Your physician recommends that you continue on your current medications as directed. Please refer to the Current Medication list given to you today.  Labwork: NONE  Testing/Procedures: NONE  Follow-Up: Your physician wants you to follow-up in: 3 months with Dr. Nishan.   If you need a refill on your cardiac medications before your next appointment, please call your pharmacy.    

## 2015-06-08 ENCOUNTER — Encounter: Payer: Self-pay | Admitting: Internal Medicine

## 2015-06-08 ENCOUNTER — Ambulatory Visit (INDEPENDENT_AMBULATORY_CARE_PROVIDER_SITE_OTHER): Payer: Medicaid Other | Admitting: Internal Medicine

## 2015-06-08 VITALS — BP 136/88 | HR 86 | Temp 98.2°F | Ht 61.0 in | Wt 170.9 lb

## 2015-06-08 DIAGNOSIS — J069 Acute upper respiratory infection, unspecified: Secondary | ICD-10-CM | POA: Diagnosis not present

## 2015-06-08 MED ORDER — GUAIFENESIN-CODEINE 100-10 MG/5ML PO SOLN: 10.0000 mL | Freq: Four times a day (QID) | ORAL | Status: DC | PRN

## 2015-06-08 NOTE — Assessment & Plan Note (Signed)
Patient seems to have very classic viral infection. Has dry cough, congestion, malaise, fatigue, myalgias, nausea, vomiting. CXR performed in the ED on 05/31/15 was negative. Flu negative. No fever. Lungs clear on exam, do not suspect pneumonia. Is higher risk for bacterial infection given her chronic steroid use, however, given her clinical appearance, do not suspect that at this time.  -Continue Phenergan prn for nausea -Guaifenesin codeine cough syrup prn -Hydration, rest -RTC in 1 week for follow up. If not improved, repeat labs, CXR to rule out pneumonia/infiltrate

## 2015-06-08 NOTE — Progress Notes (Signed)
Subjective:   [name] female   DOB: 11/02/1960 55 y.o.   MRN: [id number]  HPI: [name] is a 55 y.o. female w/ PMHx of HIV, Hep C s/p treatment/resolution, GERD, depression, chronic back pain, HLD, presents to the clinic today for a follow-up visit after she was recently seen in the ED on 05/31/15 for viral syndrome. Today, she states she is not feeling much better. Still with cough, worse at night, mostly non-productive, congestion, malaise, fatigue, nausea, and vomiting. Flu negative in the ED. No fevers. Was given an Rx for cough medication and phenergan, but has not received this as they sent it to a mail in pharmacy. On exam, does not look terribly ill. Does not have any SOB or chest pain. Is taking steroids for adrenal insufficiency, also has HIV, most recent CD4 was 880.   Past Medical History  Diagnosis Date  . HIV infection ([name] [name])     1994  . Hepatitis C     genotype 1b, 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 s/p 12 week course of simeprevir and sofosbuvir between October 2014 and January 2015 with resolution.  . Hypertension   . GERD (gastroesophageal reflux disease)   . Depression   . Chronic back pain   . Hyperlipidemia   . History of shingles   . Refusal of blood transfusions as patient is Jehovah's Witness   . Prediabetes   . Allergic rhinitis 05/09/2006  . Pituitary microadenoma ([place]) 08/08/2014  . Secondary adrenal insufficiency ([place]) 06/29/2014   Current Outpatient Prescriptions  Medication Sig Dispense Refill  . albuterol (PROAIR HFA) 108 (90 BASE) MCG/ACT inhaler Inhale 1-2 puffs into the lungs every 6 (six) hours as needed for wheezing or shortness of breath. 8.5 g 3  . amLODipine (NORVASC) 5 MG tablet TAKE 1 TABLET BY MOUTH ONCE DAILY 90 tablet 2  . benzonatate (TESSALON) 100 MG capsule Take 1 capsule (100 mg total) by mouth every 8 (eight) hours. (Patient not taking: Reported on 06/05/2015) 21 capsule 0  . cetirizine (ZYRTEC) 10 MG  tablet Take 10 mg by mouth daily.    . chlorhexidine (PERIDEX) 0.12 % solution Use as directed 15 mLs in the mouth or throat 2 (two) times daily.    . ciprofloxacin-dexamethasone (CIPRODEX) otic suspension Place 4 drops into both ears 2 (two) times daily.    . darunavir-cobicistat (PREZCOBIX) 800-150 MG per tablet Take 1 tablet by mouth daily. Swallow whole. Do NOT crush, break or chew tablets. Take with food. 90 tablet 3  . diazepam (VALIUM) 5 MG tablet TAKE 1 TABLET BY MOUTH AT BEDTIME AS NEEDED (TO LAST 30 DAYS PER PRESCRIBER) 20 tablet 0  . diphenhydrAMINE (BENADRYL) 25 MG tablet TAKE 1-2 TABLETS BY MOUTH EVERY NIGHT AT BEDTIME AS NEEDED FOR ITCHING 20 tablet 1  . guaiFENesin (MUCINEX) 600 MG 12 hr tablet Take 1 tablet (600 mg total) by mouth 2 (two) times daily as needed for to loosen phlegm. 14 tablet 0  . hydrocortisone (CORTEF) 10 MG tablet Take 10 mg by mouth 3 (three) times daily.    . hydrOXYzine (ATARAX/VISTARIL) 25 MG tablet Take 25 mg by mouth 3 (three) times daily as needed for anxiety.    Marland Kitchen ibuprofen (ADVIL,MOTRIN) 800 MG tablet Take 1 tablet (800 mg total) by mouth 3 (three) times daily. 21 tablet 0  . metoprolol tartrate (LOPRESSOR) 25 MG tablet TAKE ONE TABLET BY MOUTH TWO TIMES DAILY. 60 tablet 1  . montelukast (  SINGULAIR) 10 MG tablet TAKE 1 TABLET BY MOUTH EVERY DAY *NEED OFFICE VISIT FOR FURTHER REFILLS* 30 tablet 5  . nitroGLYCERIN (NITROSTAT) 0.4 MG SL tablet Place 1 tablet (0.4 mg total) under the tongue every 5 (five) minutes as needed for chest pain. 25 tablet 3  . Olopatadine HCl (PATADAY) 0.2 % SOLN Apply 1 drop to eye daily as needed. For allergies     . pantoprazole (PROTONIX) 20 MG tablet TAKE 1 TABLET BY MOUTH TWICE DAILY 60 tablet 2  . potassium chloride SA (K-DUR,KLOR-CON) 20 MEQ tablet Take 2 tablets (40 mEq total) by mouth daily. 8 tablet 0  . promethazine (PHENERGAN) 25 MG tablet Take 1 tablet (25 mg total) by mouth every 6 (six) hours as needed for nausea or  vomiting. 30 tablet 0  . traMADol (ULTRAM) 50 MG tablet TAKE 2 TABLETS BY MOUTH EVERY 12 HOURS AS NEEDED FOR FOR PAIN *MUST LAST 30 DAYS* 60 tablet 0  . TRUVADA 200-300 MG per tablet TAKE 1 TABLET BY MOUTH EVERY DAY 30 tablet 10  . Vitamin D, Cholecalciferol, 1000 UNITS CAPS Take 1,000 mg by mouth daily. 90 capsule 1  . [DISCONTINUED] hydrochlorothiazide (HYDRODIURIL) 25 MG tablet Take 25 mg by mouth daily.     No current facility-administered medications for this visit.    Review of Systems: General: Positive for fatigue and decreased appetite. Denies fever, chills, diaphoresis.  Respiratory: Positive for cough. Denies SOB, DOE, and wheezing.   Cardiovascular: Denies chest pain and palpitations.  Gastrointestinal: Positive for nausea and vomiting. Denies abdominal pain, and diarrhea.  Genitourinary: Denies dysuria, increased frequency, and flank pain. Endocrine: Denies hot or cold intolerance, polyuria, and polydipsia. Musculoskeletal: Positive for myalgias. Denies back pain, joint swelling, arthralgias and gait problem.  Skin: Denies pallor, rash and wounds.  Neurological: Denies dizziness, seizures, syncope, weakness, lightheadedness, numbness and headaches.  Psychiatric/Behavioral: Denies mood changes, and sleep disturbances.  Objective:   Physical Exam: Filed Vitals:   06/08/15 1001  BP: 136/88  Pulse: 86  Temp: 98.2 F (36.8 C)  TempSrc: Oral  Height: 5\' 1"  (1.549 m)  Weight: 170 lb 14.4 oz (77.52 kg)  SpO2: 100%    General: AA female, alert, cooperative, NAD. HEENT: PERRL, EOMI. Moist mucus membranes Neck: Full range of motion without pain, supple, no lymphadenopathy or carotid bruits Lungs: Clear to ascultation bilaterally, normal work of respiration, no wheezes, rales, rhonchi Heart: RRR, no murmurs, gallops, or rubs Abdomen: Soft, non-tender, non-distended, BS + Extremities: No cyanosis, clubbing, or edema Neurologic: Alert & oriented x3, cranial nerves II-XII  intact, strength grossly intact, sensation intact to light touch   Assessment & Plan:   Please see problem based assessment and plan.

## 2015-06-08 NOTE — Patient Instructions (Signed)
1. Please make a follow up for 1 week.   2. Please take all medications as previously prescribed with the following changes:  Use Robitussin AC every 6 hours and at night AS NEEDED for cough. This may make you feel slightly drowsy, best to take at night.   Take Phenergan as directed for nausea.   Stay hydrated. Lots of soups, water, tea. REST.   3. If you have worsening of your symptoms or new symptoms arise, please call the clinic [phone] or go to the ER immediately if symptoms are severe.  Upper Respiratory Infection, Adult Most upper respiratory infections (URIs) are a viral infection of the air passages leading to the lungs. A URI affects the nose, throat, and upper air passages. The most common type of URI is nasopharyngitis and is typically referred to as "the common cold." URIs run their course and usually go away on their own. Most of the time, a URI does not require medical attention, but sometimes a bacterial infection in the upper airways can follow a viral infection. This is called a secondary infection. Sinus and middle ear infections are common types of secondary upper respiratory infections. Bacterial pneumonia can also complicate a URI. A URI can worsen asthma and chronic obstructive pulmonary disease (COPD). Sometimes, these complications can require emergency medical care and may be life threatening.  CAUSES Almost all URIs are caused by viruses. A virus is a type of germ and can spread from one person to another.  RISKS FACTORS You may be at risk for a URI if:   You smoke.   You have chronic heart or lung disease.  You have a weakened defense (immune) system.   You are very young or very old.   You have nasal allergies or asthma.  You work in crowded or poorly ventilated areas.  You work in health care facilities or schools. SIGNS AND SYMPTOMS  Symptoms typically develop 2-3 days after you come in contact with a cold virus. Most viral URIs last 7-10 days.  However, viral URIs from the influenza virus (flu virus) can last 14-18 days and are typically more severe. Symptoms may include:   Runny or stuffy (congested) nose.   Sneezing.   Cough.   Sore throat.   Headache.   Fatigue.   Fever.   Loss of appetite.   Pain in your forehead, behind your eyes, and over your cheekbones (sinus pain).  Muscle aches.  DIAGNOSIS  Your health care provider may diagnose a URI by:  Physical exam.  Tests to check that your symptoms are not due to another condition such as:  Strep throat.  Sinusitis.  Pneumonia.  Asthma. TREATMENT  A URI goes away on its own with time. It cannot be cured with medicines, but medicines may be prescribed or recommended to relieve symptoms. Medicines may help:  Reduce your fever.  Reduce your cough.  Relieve nasal congestion. HOME CARE INSTRUCTIONS   Take medicines only as directed by your health care provider.   Gargle warm saltwater or take cough drops to comfort your throat as directed by your health care provider.  Use a warm mist humidifier or inhale steam from a shower to increase air moisture. This may make it easier to breathe.  Drink enough fluid to keep your urine clear or pale yellow.   Eat soups and other clear broths and maintain good nutrition.   Rest as needed.   Return to work when your temperature has returned to normal or as  your health care provider advises. You may need to stay home longer to avoid infecting others. You can also use a face mask and careful hand washing to prevent spread of the virus.  Increase the usage of your inhaler if you have asthma.   Do not use any tobacco products, including cigarettes, chewing tobacco, or electronic cigarettes. If you need help quitting, ask your health care provider. PREVENTION  The best way to protect yourself from getting a cold is to practice good hygiene.   Avoid oral or hand contact with people with cold symptoms.    Wash your hands often if contact occurs.  There is no clear evidence that vitamin C, vitamin E, echinacea, or exercise reduces the chance of developing a cold. However, it is always recommended to get plenty of rest, exercise, and practice good nutrition.  SEEK MEDICAL CARE IF:   You are getting worse rather than better.   Your symptoms are not controlled by medicine.   You have chills.  You have worsening shortness of breath.  You have brown or red mucus.  You have yellow or brown nasal discharge.  You have pain in your face, especially when you bend forward.  You have a fever.  You have swollen neck glands.  You have pain while swallowing.  You have white areas in the back of your throat. SEEK IMMEDIATE MEDICAL CARE IF:   You have severe or persistent:  Headache.  Ear pain.  Sinus pain.  Chest pain.  You have chronic lung disease and any of the following:  Wheezing.  Prolonged cough.  Coughing up blood.  A change in your usual mucus.  You have a stiff neck.  You have changes in your:  Vision.  Hearing.  Thinking.  Mood. MAKE SURE YOU:   Understand these instructions.  Will watch your condition.  Will get help right away if you are not doing well or get worse.   This information is not intended to replace advice given to you by your health care provider. Make sure you discuss any questions you have with your health care provider.   Document Released: 10/05/2000 Document Revised: 08/26/2014 Document Reviewed: 07/17/2013 Elsevier Interactive Patient Education [employer].

## 2015-06-09 ENCOUNTER — Encounter: Payer: Self-pay | Admitting: *Deleted

## 2015-06-13 NOTE — Progress Notes (Signed)
Internal Medicine Clinic Attending  Case discussed with Dr. Jones soon after the resident saw the patient.  We reviewed the resident's history and exam and pertinent patient test results.  I agree with the assessment, diagnosis, and plan of care documented in the resident's note. 

## 2015-06-15 ENCOUNTER — Ambulatory Visit: Payer: Medicaid Other | Admitting: Internal Medicine

## 2015-06-16 ENCOUNTER — Other Ambulatory Visit: Payer: Self-pay | Admitting: Pulmonary Disease

## 2015-06-24 NOTE — Telephone Encounter (Signed)
Phoned in rx. 

## 2015-07-09 ENCOUNTER — Encounter: Payer: Self-pay | Admitting: Pulmonary Disease

## 2015-07-09 ENCOUNTER — Ambulatory Visit (INDEPENDENT_AMBULATORY_CARE_PROVIDER_SITE_OTHER): Payer: Medicaid Other | Admitting: Pulmonary Disease

## 2015-07-09 VITALS — BP 124/71 | HR 78 | Temp 98.0°F | Ht 61.0 in | Wt 175.5 lb

## 2015-07-09 DIAGNOSIS — I10 Essential (primary) hypertension: Secondary | ICD-10-CM

## 2015-07-09 DIAGNOSIS — J069 Acute upper respiratory infection, unspecified: Secondary | ICD-10-CM

## 2015-07-09 DIAGNOSIS — Z8709 Personal history of other diseases of the respiratory system: Secondary | ICD-10-CM | POA: Diagnosis not present

## 2015-07-09 NOTE — Patient Instructions (Signed)
Please continue to take your medications as prescribed Follow up with Dr. Buddy Duty If you have any questions or concerns, please call our clinic

## 2015-07-09 NOTE — Progress Notes (Signed)
Subjective:    [name], female    DOB: 1961/04/21, 55 y.o.   MRN: [id number]  HPI [name] [name] is a 55 year old woman with history of HIV, treated hepatitis C, liver fibrosis, hypertension, GERD, adrenal insufficiency, prediabetes, chronic back pain presenting follow up of hypertension.  She was seen in clinic on 06/08/2015 for URI. She reports resolution of her symptoms.   No new complaints today. Doing well on her medications. Has appointment with Dr. Buddy Duty on April 11.  Review of Systems Constitutional: no fevers/chills Respiratory: no shortness of breath Cardiovascular: no chest pain Gastrointestinal: chronic nausea, no vomiting  Past Medical History  Diagnosis Date  . HIV infection ([place])     1994  . Hepatitis C     genotype 1b, 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 s/p 12 week course of simeprevir and sofosbuvir between October 2014 and January 2015 with resolution.  . Hypertension   . GERD (gastroesophageal reflux disease)   . Depression   . Chronic back pain   . Hyperlipidemia   . History of shingles   . Refusal of blood transfusions as patient is Jehovah's Witness   . Prediabetes   . Allergic rhinitis 05/09/2006  . Pituitary microadenoma ([place]) 08/08/2014  . Secondary adrenal insufficiency ([place]) 06/29/2014    Current Outpatient Prescriptions on File Prior to Visit  Medication Sig Dispense Refill  . albuterol (PROAIR HFA) 108 (90 BASE) MCG/ACT inhaler Inhale 1-2 puffs into the lungs every 6 (six) hours as needed for wheezing or shortness of breath. 8.5 g 3  . amLODipine (NORVASC) 5 MG tablet TAKE 1 TABLET BY MOUTH ONCE DAILY 90 tablet 2  . benzonatate (TESSALON) 100 MG capsule Take 1 capsule (100 mg total) by mouth every 8 (eight) hours. (Patient not taking: Reported on 06/05/2015) 21 capsule 0  . cetirizine (ZYRTEC) 10 MG tablet Take 10 mg by mouth daily.    . chlorhexidine (PERIDEX) 0.12 % solution Use as directed 15 mLs in the mouth or  throat 2 (two) times daily.    . ciprofloxacin-dexamethasone (CIPRODEX) otic suspension Place 4 drops into both ears 2 (two) times daily.    . darunavir-cobicistat (PREZCOBIX) 800-150 MG per tablet Take 1 tablet by mouth daily. Swallow whole. Do NOT crush, break or chew tablets. Take with food. 90 tablet 3  . diazepam (VALIUM) 5 MG tablet TAKE 1 TABLET BY MOUTH EVERY NIGHT AT BEDTIME AS NEEDED *MUST LAST 30 DAYS* 20 tablet 0  . diphenhydrAMINE (BENADRYL) 25 MG tablet TAKE 1-2 TABLETS BY MOUTH EVERY NIGHT AT BEDTIME AS NEEDED FOR ITCHING 20 tablet 1  . guaiFENesin (MUCINEX) 600 MG 12 hr tablet Take 1 tablet (600 mg total) by mouth 2 (two) times daily as needed for to loosen phlegm. 14 tablet 0  . guaiFENesin-codeine 100-10 MG/5ML syrup Take 10 mLs by mouth every 6 (six) hours as needed for cough. 120 mL 0  . hydrocortisone (CORTEF) 10 MG tablet Take 10 mg by mouth 3 (three) times daily.    . hydrOXYzine (ATARAX/VISTARIL) 25 MG tablet Take 25 mg by mouth 3 (three) times daily as needed for anxiety.    Marland Kitchen ibuprofen (ADVIL,MOTRIN) 800 MG tablet Take 1 tablet (800 mg total) by mouth 3 (three) times daily. 21 tablet 0  . metoprolol tartrate (LOPRESSOR) 25 MG tablet TAKE ONE TABLET BY MOUTH TWO TIMES DAILY. 60 tablet 1  . montelukast (SINGULAIR) 10 MG tablet TAKE 1 TABLET BY MOUTH EVERY DAY *NEED  OFFICE VISIT FOR FURTHER REFILLS* 30 tablet 5  . nitroGLYCERIN (NITROSTAT) 0.4 MG SL tablet Place 1 tablet (0.4 mg total) under the tongue every 5 (five) minutes as needed for chest pain. 25 tablet 3  . Olopatadine HCl (PATADAY) 0.2 % SOLN Apply 1 drop to eye daily as needed. For allergies     . pantoprazole (PROTONIX) 20 MG tablet TAKE 1 TABLET BY MOUTH TWICE DAILY 60 tablet 2  . potassium chloride SA (K-DUR,KLOR-CON) 20 MEQ tablet Take 2 tablets (40 mEq total) by mouth daily. 8 tablet 0  . promethazine (PHENERGAN) 25 MG tablet Take 1 tablet (25 mg total) by mouth every 6 (six) hours as needed for nausea or  vomiting. 30 tablet 0  . traMADol (ULTRAM) 50 MG tablet TAKE 2 TABLETS BY MOUTH EVERY 12 HOURS AS NEEDED FOR FOR PAIN *MUST LAST 30 DAYS* 60 tablet 0  . TRUVADA 200-300 MG per tablet TAKE 1 TABLET BY MOUTH EVERY DAY 30 tablet 10  . Vitamin D, Cholecalciferol, 1000 UNITS CAPS Take 1,000 mg by mouth daily. 90 capsule 1  . [DISCONTINUED] hydrochlorothiazide (HYDRODIURIL) 25 MG tablet Take 25 mg by mouth daily.     No current facility-administered medications on file prior to visit.   Objective:   Physical Exam Blood pressure 124/71, pulse 78, temperature 98 F (36.7 C), temperature source Oral, height 5\' 1"  (1.549 m), weight 175 lb 8 oz (79.606 kg), SpO2 100 %. General Apperance: NAD HEENT: Normocephalic, atraumatic, anicteric sclera Neck: Supple, trachea midline Lungs: Clear to auscultation bilaterally. No wheezes, rhonchi or rales. Breathing comfortably Heart: Regular rate and rhythm, no murmur/rub/gallop Abdomen: Soft, nontender, nondistended, no rebound/guarding Extremities: Warm and well perfused, no edema Skin: No rashes or lesions Neurologic: Alert and interactive. No gross deficits.    Assessment & Plan:  HTN (hypertension) BP Readings from Last 3 Encounters:  07/09/15 124/71  06/08/15 136/88  06/05/15 146/82    Lab Results  Component Value Date   NA 137 05/31/2015   K 3.4* 05/31/2015   CREATININE 0.76 05/31/2015    Assessment: Blood pressure control: Well controlled Progress toward BP goal:  At goal  Plan: Medications:  Continue amlodipine 5mg  daily and metoprolol tartrate 25mg  BID  Acute upper respiratory infection Acute viral URI resolved.   Jacques Earthly, MD  Internal Medicine Teaching Service PGY-2 07/11/2015 5:13 PM

## 2015-07-11 NOTE — Assessment & Plan Note (Signed)
BP Readings from Last 3 Encounters:  07/09/15 124/71  06/08/15 136/88  06/05/15 146/82    Lab Results  Component Value Date   NA 137 05/31/2015   K 3.4* 05/31/2015   CREATININE 0.76 05/31/2015    Assessment: Blood pressure control: Well controlled Progress toward BP goal:  At goal  Plan: Medications:  Continue amlodipine 5mg  daily and metoprolol tartrate 25mg  BID

## 2015-07-11 NOTE — Assessment & Plan Note (Signed)
Acute viral URI resolved.

## 2015-07-13 NOTE — Progress Notes (Signed)
Internal Medicine Clinic Attending  Case discussed with Dr. Krall soon after the resident saw the patient.  We reviewed the resident's history and exam and pertinent patient test results.  I agree with the assessment, diagnosis, and plan of care documented in the resident's note. 

## 2015-07-14 ENCOUNTER — Other Ambulatory Visit: Payer: Self-pay | Admitting: Infectious Diseases

## 2015-07-14 ENCOUNTER — Other Ambulatory Visit: Payer: Self-pay | Admitting: Pulmonary Disease

## 2015-07-14 DIAGNOSIS — K219 Gastro-esophageal reflux disease without esophagitis: Secondary | ICD-10-CM

## 2015-07-14 DIAGNOSIS — B20 Human immunodeficiency virus [HIV] disease: Secondary | ICD-10-CM

## 2015-07-15 MED ORDER — DARUNAVIR-COBICISTAT 800-150 MG PO TABS: 1.0000 | ORAL_TABLET | Freq: Every day | ORAL | Status: DC

## 2015-07-15 NOTE — Telephone Encounter (Signed)
Last seen 3/16.  No f/u appt scheduled.

## 2015-07-16 NOTE — Telephone Encounter (Signed)
Diazepam rx called to [facility].

## 2015-07-21 ENCOUNTER — Ambulatory Visit (INDEPENDENT_AMBULATORY_CARE_PROVIDER_SITE_OTHER): Payer: Medicaid Other | Admitting: Internal Medicine

## 2015-07-21 ENCOUNTER — Ambulatory Visit (HOSPITAL_COMMUNITY)
Admission: RE | Admit: 2015-07-21 | Discharge: 2015-07-21 | Disposition: A | Discharge status: Home / Self Care | Payer: Medicaid Other | Source: Ambulatory Visit | Attending: Internal Medicine | Admitting: Internal Medicine

## 2015-07-21 ENCOUNTER — Encounter: Payer: Self-pay | Admitting: Internal Medicine

## 2015-07-21 VITALS — BP 143/76 | HR 89 | Temp 98.4°F | Ht 61.0 in | Wt 174.5 lb

## 2015-07-21 DIAGNOSIS — R0602 Shortness of breath: Secondary | ICD-10-CM | POA: Insufficient documentation

## 2015-07-21 DIAGNOSIS — R062 Wheezing: Secondary | ICD-10-CM | POA: Insufficient documentation

## 2015-07-21 DIAGNOSIS — Z21 Asymptomatic human immunodeficiency virus [HIV] infection status: Secondary | ICD-10-CM | POA: Diagnosis not present

## 2015-07-21 DIAGNOSIS — Z7952 Long term (current) use of systemic steroids: Secondary | ICD-10-CM | POA: Diagnosis not present

## 2015-07-21 DIAGNOSIS — J988 Other specified respiratory disorders: Secondary | ICD-10-CM

## 2015-07-21 LAB — BASIC METABOLIC PANEL
Anion gap: 9 (ref 5–15)
BUN: 8 mg/dL (ref 6–20)
CALCIUM: 9.3 mg/dL (ref 8.9–10.3)
CO2: 27 mmol/L (ref 22–32)
CREATININE: 0.92 mg/dL (ref 0.44–1.00)
Chloride: 102 mmol/L (ref 101–111)
GFR calc Af Amer: >60 mL/min (ref >60)
Glucose, Bld: 114 mg/dL — ABNORMAL HIGH (ref 65–99)
POTASSIUM: 3.7 mmol/L (ref 3.5–5.1)
SODIUM: 138 mmol/L (ref 135–145)

## 2015-07-21 LAB — CBC
HEMATOCRIT: 40.3 % (ref 36.0–46.0)
Hemoglobin: 12.7 g/dL (ref 12.0–15.0)
MCH: 25.2 pg — ABNORMAL LOW (ref 26.0–34.0)
MCHC: 31.5 g/dL (ref 30.0–36.0)
MCV: 80 fL (ref 78.0–100.0)
Platelets: 279 10*3/uL (ref 150–400)
RBC: 5.04 MIL/uL (ref 3.87–5.11)
RDW: 16.3 % — AB (ref 11.5–15.5)
WBC: 10.4 10*3/uL (ref 4.0–10.5)

## 2015-07-21 MED ORDER — GUAIFENESIN-CODEINE 100-10 MG/5ML PO SOLN
10.0000 mL | Freq: Four times a day (QID) | ORAL | Status: DC | PRN
Start: 2015-07-21 — End: 2015-10-02

## 2015-07-21 MED ORDER — AZITHROMYCIN 250 MG PO TABS
ORAL_TABLET | ORAL | Status: AC
Start: 2015-07-21 — End: 2015-07-26

## 2015-07-21 NOTE — Patient Instructions (Signed)
1. Please return for a follow up appointment on Friday.   2. Please take all medications as previously prescribed with the following changes:  Take Azithromycin 500 mg once today, then 250 mg daily until gone.   Take double dose of steroids for the next 2-3 days.   IF you get worse, please call us and come back, it may be necessary to admit you to the hospital.   3. If you have worsening of your symptoms or new symptoms arise, please call the clinic [phone] or go to the ER immediately if symptoms are severe.

## 2015-07-22 ENCOUNTER — Ambulatory Visit: Payer: Medicaid Other | Admitting: Internal Medicine

## 2015-07-22 NOTE — Assessment & Plan Note (Signed)
Patient with recurrent upper respiratory symptoms. Says her symptoms resolved from her previous infection in 05/2015. Over the past 2-3 days has had productive cough, congestion, hoarseness, chest discomfort, mild wheezes, and intermittent SOB. Some concern for pneumonia given chronic steroids and HIV (although well controlled). CXR performed, only showed bronchitic changes, no obvious infiltrate. No fever on exam, vital stable. No leukocytosis.  -Give Rx for Azithro; 500 mg once today, then 250 mg daily for 4 more days.  -Stress dosed steroids for 2 days (starting tomorrow) given chronic steroid use -Robitussin AC for cough -RTC in 3-4 days for re-evaluation

## 2015-07-22 NOTE — Progress Notes (Signed)
Subjective:   [name] female   DOB: Jul 11, 1960 54 y.o.   MRN: [id number]  HPI: [name] is a 55 y.o. female w/ PMHx of HIV, Hep C s/p treatment/resolution, GERD, depression, chronic back pain, HLD, presents to the clinic today for an acute visit for recurrent upper respiratory tract infection symptoms. Had this back in February but her symptoms completely resolved at that time. She states taht over the past 2 days, she has felt a "tickle" in her throat, nasal congestion, cough productive of a greenish sputum, hoarseness, and mild intermittent SOB, mostly associated with exertion. Of note, patient does take chronic steroids for chronic adrenal insufficiency, and also has well controlled HIV (most recent CD4 880). Patient denies myalgias, arthralgias, nausea, diarrhea, or abdominal discomfort.   Past Medical History  Diagnosis Date  . HIV infection ([place])     1994  . Hepatitis C     genotype 1b, 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 s/p 12 week course of simeprevir and sofosbuvir between October 2014 and January 2015 with resolution.  . Hypertension   . GERD (gastroesophageal reflux disease)   . Depression   . Chronic back pain   . Hyperlipidemia   . History of shingles   . Refusal of blood transfusions as patient is Jehovah's Witness   . Prediabetes   . Allergic rhinitis 05/09/2006  . Pituitary microadenoma ([place]) 08/08/2014  . Secondary adrenal insufficiency ([place]) 06/29/2014   Current Outpatient Prescriptions  Medication Sig Dispense Refill  . albuterol (PROAIR HFA) 108 (90 BASE) MCG/ACT inhaler Inhale 1-2 puffs into the lungs every 6 (six) hours as needed for wheezing or shortness of breath. 8.5 g 3  . amLODipine (NORVASC) 5 MG tablet TAKE 1 TABLET BY MOUTH ONCE DAILY 90 tablet 2  . azithromycin (ZITHROMAX Z-PAK) 250 MG tablet Take 2 tablets (500 mg) on  Day 1,  followed by 1 tablet (250 mg) once daily on Days 2 through 5. 6 each 0  . cetirizine  (ZYRTEC) 10 MG tablet Take 10 mg by mouth daily.    . chlorhexidine (PERIDEX) 0.12 % solution Use as directed 15 mLs in the mouth or throat 2 (two) times daily.    . ciprofloxacin-dexamethasone (CIPRODEX) otic suspension Place 4 drops into both ears 2 (two) times daily.    . darunavir-cobicistat (PREZCOBIX) 800-150 MG tablet Take 1 tablet by mouth daily. Swallow whole. Do NOT crush, break or chew tablets. Take with food. 90 tablet 3  . diazepam (VALIUM) 5 MG tablet TAKE 1 TABLET BY MOUTH EVERY NIGHT AT BEDTIME AS NEEDED *MUST LAST 30 DAYS* May fill after July 21, 2015 20 tablet 0  . diphenhydrAMINE (BENADRYL) 25 MG tablet TAKE 1-2 TABLETS BY MOUTH EVERY NIGHT AT BEDTIME AS NEEDED FOR ITCHING 20 tablet 1  . guaiFENesin-codeine 100-10 MG/5ML syrup Take 10 mLs by mouth every 6 (six) hours as needed for cough. 120 mL 0  . hydrocortisone (CORTEF) 10 MG tablet Take 10 mg by mouth. 1 tablet in the morning, 0.5 tablet at 11AM and 0.5 tablet at 3PM    . hydrOXYzine (ATARAX/VISTARIL) 25 MG tablet Take 25 mg by mouth 3 (three) times daily as needed for anxiety.    Marland Kitchen ibuprofen (ADVIL,MOTRIN) 800 MG tablet Take 1 tablet (800 mg total) by mouth 3 (three) times daily. 21 tablet 0  . metoprolol tartrate (LOPRESSOR) 25 MG tablet TAKE ONE TABLET BY MOUTH TWO TIMES DAILY. 60 tablet 1  .  montelukast (SINGULAIR) 10 MG tablet TAKE 1 TABLET BY MOUTH EVERY DAY *NEED OFFICE VISIT FOR FURTHER REFILLS* 30 tablet 5  . nitroGLYCERIN (NITROSTAT) 0.4 MG SL tablet Place 1 tablet (0.4 mg total) under the tongue every 5 (five) minutes as needed for chest pain. 25 tablet 3  . Olopatadine HCl (PATADAY) 0.2 % SOLN Apply 1 drop to eye daily as needed. For allergies     . pantoprazole (PROTONIX) 20 MG tablet TAKE 1 TABLET BY MOUTH TWICE DAILY 60 tablet 2  . potassium chloride SA (K-DUR,KLOR-CON) 20 MEQ tablet Take 2 tablets (40 mEq total) by mouth daily. 8 tablet 0  . promethazine (PHENERGAN) 25 MG tablet Take 1 tablet (25 mg total) by  mouth every 6 (six) hours as needed for nausea or vomiting. 30 tablet 0  . traMADol (ULTRAM) 50 MG tablet TAKE 2 TABLETS BY MOUTH EVERY 12 HOURS AS NEEDED FOR FOR PAIN *MUST LAST 30 DAYS* 60 tablet 0  . TRUVADA 200-300 MG tablet TAKE 1 TABLET BY MOUTH EVERY DAY 90 tablet 3  . Vitamin D, Cholecalciferol, 1000 UNITS CAPS Take 1,000 mg by mouth daily. 90 capsule 1  . [DISCONTINUED] hydrochlorothiazide (HYDRODIURIL) 25 MG tablet Take 25 mg by mouth daily.     No current facility-administered medications for this visit.    Review of Systems: General: Positive for fatigue and decreased appetite. Denies fever, chills, diaphoresis.  Respiratory: Positive for cough, congestion, SOB. Denies wheezing.   Cardiovascular: Denies chest pain and palpitations.  Gastrointestinal: Denies abdominal pain, nausea, vomiting, and diarrhea.  Genitourinary: Denies dysuria, increased frequency, and flank pain. Endocrine: Denies hot or cold intolerance, polyuria, and polydipsia. Musculoskeletal: Denies back pain, joint swelling, arthralgias and gait problem.  Skin: Denies pallor, rash and wounds.  Neurological: Denies dizziness, seizures, syncope, weakness, lightheadedness, numbness and headaches.  Psychiatric/Behavioral: Denies mood changes, and sleep disturbances.  Objective:   Physical Exam: Filed Vitals:   07/21/15 1428  BP: 143/76  Pulse: 89  Temp: 98.4 F (36.9 C)  TempSrc: Oral  Height: 5\' 1"  (1.549 m)  Weight: 174 lb 8 oz (79.153 kg)  SpO2: 100%    General: AA female, alert, cooperative, NAD. HEENT: PERRL, EOMI. Moist mucus membranes Neck: Full range of motion without pain, supple, no lymphadenopathy or carotid bruits Lungs: Air entry equal bilaterally. Scattered rhonchi and wheezes. No rales. No decreased breath sounds. No fremitus. No dullness to percussion.  Heart: RRR, no murmurs, gallops, or rubs Abdomen: Soft, non-tender, non-distended, BS + Extremities: No cyanosis, clubbing, or  edema Neurologic: Alert & oriented x3, cranial nerves II-XII intact, strength grossly intact, sensation intact to light touch   Assessment & Plan:   Please see problem based assessment and plan.

## 2015-07-24 ENCOUNTER — Ambulatory Visit (INDEPENDENT_AMBULATORY_CARE_PROVIDER_SITE_OTHER): Payer: Medicaid Other | Admitting: Internal Medicine

## 2015-07-24 ENCOUNTER — Encounter: Payer: Self-pay | Admitting: Internal Medicine

## 2015-07-24 VITALS — BP 137/79 | HR 109 | Temp 98.1°F | Ht 61.0 in | Wt 173.1 lb

## 2015-07-24 DIAGNOSIS — J988 Other specified respiratory disorders: Secondary | ICD-10-CM

## 2015-07-24 MED ORDER — AMOXICILLIN-POT CLAVULANATE 875-125 MG PO TABS: 1.0000 | ORAL_TABLET | Freq: Two times a day (BID) | ORAL | Status: DC

## 2015-07-24 MED ORDER — ALBUTEROL SULFATE (2.5 MG/3ML) 0.083% IN NEBU: 2.5000 mg | INHALATION_SOLUTION | Freq: Once | RESPIRATORY_TRACT | Status: DC

## 2015-07-24 MED ORDER — FLUTICASONE PROPIONATE 50 MCG/ACT NA SUSP: 2.0000 | Freq: Every day | NASAL | Status: DC

## 2015-07-24 NOTE — Patient Instructions (Signed)
1. Please make a follow up appointment for 1 week.   2. Please take all medications as previously prescribed with the following changes:  Start using Flonase daily JUST [place].   I have given you a prescription for Augmentin. Start this antibiotic ONLY IF your symptoms do not improve in 4-5 days.   Use inhaler as needed at home.   Continue use of Guaifenesin-Codeine for cough and congestion.   Use Neti pot only if comfortable to do so.   3. If you have worsening of your symptoms or new symptoms arise, please call the clinic [phone] or go to the ER immediately if symptoms are severe.  You have done a great job in taking all your medications. Please continue to do this.

## 2015-07-24 NOTE — Progress Notes (Signed)
Subjective:   [name] female   DOB: 12/01/1960 56 y.o.   MRN: [id number]  HPI: [name] [name] is a 55 y.o. female w/ PMHx of HIV, Hep C s/p treatment/resolution, GERD, depression, chronic back pain, HLD, presents to the clinic today for a follow up visit for recurrent upper respiratory tract infection symptoms. Patient was seen last week with continued cough and congestion, now mostly sinus congestion. Given Rx for azithro at her last visit for upper respiratory symptoms given her HIV and chronic steroid use, also instructed to take stress dose of his steroids. Today, she feels like she hasn't improved much, today it seems that most of her symptoms are involving her sinuses. Says she is having a difficult time breathing through her nose, has abundant rhinorrhea, sinus headache, tooth pain. Says she has always had sinus troubles in the past and also ear trouble, currently has bilateral tympanostomy tubes. She claims she tried to use a neti pot a day or so ago and had water come out of her tympanostomy tube. Still having chills, intermittent SOB, and continued cough productive of a greenish sputum.   Past Medical History  Diagnosis Date  . HIV infection ([place])     1994  . Hepatitis C     genotype 1b, 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 s/p 12 week course of simeprevir and sofosbuvir between October 2014 and January 2015 with resolution.  . Hypertension   . GERD (gastroesophageal reflux disease)   . Depression   . Chronic back pain   . Hyperlipidemia   . History of shingles   . Refusal of blood transfusions as patient is Jehovah's Witness   . Prediabetes   . Allergic rhinitis 05/09/2006  . Pituitary microadenoma ([place]) 08/08/2014  . Secondary adrenal insufficiency ([place]) 06/29/2014   Current Outpatient Prescriptions  Medication Sig Dispense Refill  . albuterol (PROAIR HFA) 108 (90 BASE) MCG/ACT inhaler Inhale 1-2 puffs into the lungs every 6 (six) hours as needed  for wheezing or shortness of breath. 8.5 g 3  . amLODipine (NORVASC) 5 MG tablet TAKE 1 TABLET BY MOUTH ONCE DAILY 90 tablet 2  . amoxicillin-clavulanate (AUGMENTIN) 875-125 MG tablet Take 1 tablet by mouth 2 (two) times daily. 10 tablet 0  . cetirizine (ZYRTEC) 10 MG tablet Take 10 mg by mouth daily.    . chlorhexidine (PERIDEX) 0.12 % solution Use as directed 15 mLs in the mouth or throat 2 (two) times daily.    . ciprofloxacin-dexamethasone (CIPRODEX) otic suspension Place 4 drops into both ears 2 (two) times daily.    . darunavir-cobicistat (PREZCOBIX) 800-150 MG tablet Take 1 tablet by mouth daily. Swallow whole. Do NOT crush, break or chew tablets. Take with food. 90 tablet 3  . diazepam (VALIUM) 5 MG tablet TAKE 1 TABLET BY MOUTH EVERY NIGHT AT BEDTIME AS NEEDED *MUST LAST 30 DAYS* May fill after July 21, 2015 20 tablet 0  . diphenhydrAMINE (BENADRYL) 25 MG tablet TAKE 1-2 TABLETS BY MOUTH EVERY NIGHT AT BEDTIME AS NEEDED FOR ITCHING 20 tablet 1  . guaiFENesin-codeine 100-10 MG/5ML syrup Take 10 mLs by mouth every 6 (six) hours as needed for cough. 120 mL 0  . hydrocortisone (CORTEF) 10 MG tablet Take 10 mg by mouth. 1 tablet in the morning, 0.5 tablet at 11AM and 0.5 tablet at 3PM    . hydrOXYzine (ATARAX/VISTARIL) 25 MG tablet Take 25 mg by mouth 3 (three) times daily as needed for anxiety.    Marland Kitchen  ibuprofen (ADVIL,MOTRIN) 800 MG tablet Take 1 tablet (800 mg total) by mouth 3 (three) times daily. 21 tablet 0  . metoprolol tartrate (LOPRESSOR) 25 MG tablet TAKE ONE TABLET BY MOUTH TWO TIMES DAILY. 60 tablet 1  . montelukast (SINGULAIR) 10 MG tablet TAKE 1 TABLET BY MOUTH EVERY DAY *NEED OFFICE VISIT FOR FURTHER REFILLS* 30 tablet 5  . nitroGLYCERIN (NITROSTAT) 0.4 MG SL tablet Place 1 tablet (0.4 mg total) under the tongue every 5 (five) minutes as needed for chest pain. 25 tablet 3  . Olopatadine HCl (PATADAY) 0.2 % SOLN Apply 1 drop to eye daily as needed. For allergies     . pantoprazole  (PROTONIX) 20 MG tablet TAKE 1 TABLET BY MOUTH TWICE DAILY 60 tablet 2  . potassium chloride SA (K-DUR,KLOR-CON) 20 MEQ tablet Take 2 tablets (40 mEq total) by mouth daily. 8 tablet 0  . promethazine (PHENERGAN) 25 MG tablet Take 1 tablet (25 mg total) by mouth every 6 (six) hours as needed for nausea or vomiting. 30 tablet 0  . traMADol (ULTRAM) 50 MG tablet TAKE 2 TABLETS BY MOUTH EVERY 12 HOURS AS NEEDED FOR FOR PAIN *MUST LAST 30 DAYS* 60 tablet 0  . TRUVADA 200-300 MG tablet TAKE 1 TABLET BY MOUTH EVERY DAY 90 tablet 3  . Vitamin D, Cholecalciferol, 1000 UNITS CAPS Take 1,000 mg by mouth daily. 90 capsule 1  . [DISCONTINUED] hydrochlorothiazide (HYDRODIURIL) 25 MG tablet Take 25 mg by mouth daily.     Current Facility-Administered Medications  Medication Dose Route Frequency Provider Last Rate Last Dose  . albuterol (PROVENTIL) (2.5 MG/3ML) 0.083% nebulizer solution 2.5 mg  2.5 mg Nebulization Once Corky Sox, MD      . fluticasone [facility]) 50 MCG/ACT nasal spray 2 spray  2 spray Each Nare Daily Corky Sox, MD        Review of Systems: General: Positive for fatigue and decreased appetite. Denies fever, chills, diaphoresis.  Respiratory: Positive for cough, sinus congestion, SOB. Denies wheezing.   Cardiovascular: Denies chest pain and palpitations.  Gastrointestinal: Denies abdominal pain, nausea, vomiting, and diarrhea.  Genitourinary: Denies dysuria, increased frequency, and flank pain. Endocrine: Denies hot or cold intolerance, polyuria, and polydipsia. Musculoskeletal: Denies back pain, joint swelling, arthralgias and gait problem.  Skin: Denies pallor, rash and wounds.  Neurological: Denies dizziness, seizures, syncope, weakness, lightheadedness, numbness and headaches.  Psychiatric/Behavioral: Denies mood changes, and sleep disturbances.  Objective:   Physical Exam: Filed Vitals:   07/24/15 1353  BP: 137/79  Pulse: 109  Temp: 98.1 F (36.7 C)  TempSrc: Oral    Height: 5\' 1"  (1.549 m)  Weight: 173 lb 1.6 oz (78.518 kg)  SpO2: 100%    General: AA female, alert, cooperative, NAD. Wearing mask. Very congested.  HEENT: PERRL, EOMI. Moist mucus membranes. Pale nasal mucosa with abundant discharge. Very mild pharyngeal erythema. No exudates. Right ear with tympanostomy tube, left not visualized.  Neck: Full range of motion without pain, supple, no lymphadenopathy or carotid bruits Lungs: Air entry equal bilaterally. Scattered rhonchi and wheezes. No rales. No decreased breath sounds. No fremitus. No dullness to percussion.  Heart: RRR, no murmurs, gallops, or rubs Abdomen: Soft, non-tender, non-distended, BS + Extremities: No cyanosis, clubbing, or edema Neurologic: Alert & oriented x3, cranial nerves II-XII intact, strength grossly intact, sensation intact to light touch   Assessment & Plan:   Please see problem based assessment and plan.

## 2015-07-27 ENCOUNTER — Ambulatory Visit: Payer: Medicaid Other | Admitting: Internal Medicine

## 2015-07-27 NOTE — Assessment & Plan Note (Addendum)
Has been going on for some time now, started with productive cough, congestion, and SOB. On exam last week, lungs with scattered rhonchi and wheezes. XR at that time only showed bronchitic changes. No obvious infiltrate. However, given her ongoing symptoms and chronic steroid use, given Z-pak and had her take stress dose of her steroids. Today, she says she is not feeling much better. Now mostly with sinus congestion. Patient has scattered rhonchi and wheezes still on exam, says she still has some SOB. Chills. Given albuterol neb in the clinic, says it helped her breathing. Still suspect this is a viral infection and will take time to resolve, now especially given lack of improvement with azithro.  -Continue conservative measures for now; decongestant, neti pot if able, rest, hydration -Will finish course of azithro -Given spacer for inhaler to use if wheezing and SOB -Also given Rx for Augmentin  For sinusitis ONLY to use if her symptoms do not resolve after 5 more days. Still think this is viral, however, given her immunosuppression, will give her this option if she does not improve. She understands this.  -Given Rx for Flonase ONLY to use temporarily while her sinus symptoms resolve. Should not use chronically given her use of Prescobix and chronic steroids.  -RTC in 1 week

## 2015-07-27 NOTE — Progress Notes (Signed)
Internal Medicine Clinic Attending  Case discussed with Dr. Jones at the time of the visit.  We reviewed the resident's history and exam and pertinent patient test results.  I agree with the assessment, diagnosis, and plan of care documented in the resident's note.  

## 2015-07-28 NOTE — Progress Notes (Signed)
Internal Medicine Clinic Attending  Case discussed with Dr. Jones at the time of the visit.  We reviewed the resident's history and exam and pertinent patient test results.  I agree with the assessment, diagnosis, and plan of care documented in the resident's note.  

## 2015-07-31 ENCOUNTER — Other Ambulatory Visit: Payer: Self-pay | Admitting: Pulmonary Disease

## 2015-07-31 ENCOUNTER — Encounter: Payer: Self-pay | Admitting: Internal Medicine

## 2015-07-31 ENCOUNTER — Ambulatory Visit (INDEPENDENT_AMBULATORY_CARE_PROVIDER_SITE_OTHER): Payer: Medicaid Other | Admitting: Internal Medicine

## 2015-07-31 VITALS — BP 136/77 | HR 76 | Temp 97.8°F | Ht 61.0 in | Wt 171.8 lb

## 2015-07-31 DIAGNOSIS — E876 Hypokalemia: Secondary | ICD-10-CM | POA: Diagnosis not present

## 2015-07-31 DIAGNOSIS — J988 Other specified respiratory disorders: Secondary | ICD-10-CM | POA: Diagnosis present

## 2015-07-31 NOTE — Assessment & Plan Note (Addendum)
Pt presents for f/u of URI symptoms completing azithromycin and then augmentin course.  She is on her last day of augmentin.  Today, she is feeling much better but still with lingering cough.  VS normal.  She still endorses a lingering cough.   -cont conservative measures (neti-pot etc.) -return to see Dr. Randell Patient in May or sooner if symptoms worsen

## 2015-07-31 NOTE — Progress Notes (Signed)
[name], female   DOB: 05-12-1960, 55 y.o.   MRN: [id number]     Subjective:   [name] female    DOB: 1960/11/05 55 y.o.    MRN: [id number] Health Maintenance Due: There are no preventive care reminders to display for this patient.  _________________________________________________  HPI: [name] is a 55 y.o. female here for a 1 week f/u visit for URI symptoms.  Pt has a PMH outlined below.  Please see problem-based charting assessment and plan for further status of patient's chronic medical problems addressed at today's visit.  PMH: Past Medical History  Diagnosis Date  . HIV infection ([place])     1994  . Hepatitis C     genotype 1b, 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 s/p 12 week course of simeprevir and sofosbuvir between October 2014 and January 2015 with resolution.  . Hypertension   . GERD (gastroesophageal reflux disease)   . Depression   . Chronic back pain   . Hyperlipidemia   . History of shingles   . Refusal of blood transfusions as patient is Jehovah's Witness   . Prediabetes   . Allergic rhinitis 05/09/2006  . Pituitary microadenoma ([place]) 08/08/2014  . Secondary adrenal insufficiency ([place]) 06/29/2014    Medications: Current Outpatient Prescriptions on File Prior to Visit  Medication Sig Dispense Refill  . albuterol (PROAIR HFA) 108 (90 BASE) MCG/ACT inhaler Inhale 1-2 puffs into the lungs every 6 (six) hours as needed for wheezing or shortness of breath. 8.5 g 3  . amLODipine (NORVASC) 5 MG tablet TAKE 1 TABLET BY MOUTH ONCE DAILY 90 tablet 2  . amoxicillin-clavulanate (AUGMENTIN) 875-125 MG tablet Take 1 tablet by mouth 2 (two) times daily. 10 tablet 0  . cetirizine (ZYRTEC) 10 MG tablet Take 10 mg by mouth daily.    . chlorhexidine (PERIDEX) 0.12 % solution Use as directed 15 mLs in the mouth or throat 2 (two) times daily.    . ciprofloxacin-dexamethasone (CIPRODEX) otic suspension Place 4 drops into both ears  2 (two) times daily.    . darunavir-cobicistat (PREZCOBIX) 800-150 MG tablet Take 1 tablet by mouth daily. Swallow whole. Do NOT crush, break or chew tablets. Take with food. 90 tablet 3  . diazepam (VALIUM) 5 MG tablet TAKE 1 TABLET BY MOUTH EVERY NIGHT AT BEDTIME AS NEEDED *MUST LAST 30 DAYS* May fill after July 21, 2015 20 tablet 0  . diphenhydrAMINE (BENADRYL) 25 MG tablet TAKE 1-2 TABLETS BY MOUTH EVERY NIGHT AT BEDTIME AS NEEDED FOR ITCHING 20 tablet 1  . guaiFENesin-codeine 100-10 MG/5ML syrup Take 10 mLs by mouth every 6 (six) hours as needed for cough. 120 mL 0  . hydrocortisone (CORTEF) 10 MG tablet Take 10 mg by mouth. 1 tablet in the morning, 0.5 tablet at 11AM and 0.5 tablet at 3PM    . hydrOXYzine (ATARAX/VISTARIL) 25 MG tablet Take 25 mg by mouth 3 (three) times daily as needed for anxiety.    Marland Kitchen ibuprofen (ADVIL,MOTRIN) 800 MG tablet Take 1 tablet (800 mg total) by mouth 3 (three) times daily. 21 tablet 0  . metoprolol tartrate (LOPRESSOR) 25 MG tablet TAKE ONE TABLET BY MOUTH TWO TIMES DAILY. 60 tablet 1  . montelukast (SINGULAIR) 10 MG tablet TAKE 1 TABLET BY MOUTH EVERY DAY *NEED OFFICE VISIT FOR FURTHER REFILLS* 30 tablet 5  . nitroGLYCERIN (NITROSTAT) 0.4 MG SL tablet Place 1 tablet (0.4 mg total) under the tongue every 5 (five)  minutes as needed for chest pain. 25 tablet 3  . Olopatadine HCl (PATADAY) 0.2 % SOLN Apply 1 drop to eye daily as needed. For allergies     . pantoprazole (PROTONIX) 20 MG tablet TAKE 1 TABLET BY MOUTH TWICE DAILY 60 tablet 2  . potassium chloride SA (K-DUR,KLOR-CON) 20 MEQ tablet Take 2 tablets (40 mEq total) by mouth daily. 8 tablet 0  . promethazine (PHENERGAN) 25 MG tablet Take 1 tablet (25 mg total) by mouth every 6 (six) hours as needed for nausea or vomiting. 30 tablet 0  . traMADol (ULTRAM) 50 MG tablet TAKE 2 TABLETS BY MOUTH EVERY 12 HOURS AS NEEDED FOR FOR PAIN *MUST LAST 30 DAYS* 60 tablet 0  . TRUVADA 200-300 MG tablet TAKE 1 TABLET BY MOUTH  EVERY DAY 90 tablet 3  . Vitamin D, Cholecalciferol, 1000 UNITS CAPS Take 1,000 mg by mouth daily. 90 capsule 1  . [DISCONTINUED] hydrochlorothiazide (HYDRODIURIL) 25 MG tablet Take 25 mg by mouth daily.     No current facility-administered medications on file prior to visit.    Allergies: Allergies  Allergen Reactions  . Acetaminophen Other (See Comments)    Inflamed liver, hospitalized REACTION: Had liver problems, was hospitalized  . Dyazide [Hydrochlorothiazide W-Triamterene] Hives  . Lisinopril     Cough   . Triamterene Hives  . Morphine Rash and Hives    REACTION: rash    FH: Family History  Problem Relation Age of Onset  . Heart disease Mother   . Diabetes Mother   . Stroke Mother   . Heart disease Father   . Stroke Father   . Diabetes Father   . Hepatitis Sister     hcv  . Stroke Other   . Colon polyps Brother   . Cancer Sister     lung    SH: Social History   Social History  . Marital Status: Single    Spouse Name: N/A  . Number of Children: 0  . Years of Education: N/A   Social History Main Topics  . Smoking status: Former Smoker    Types: Cigarettes    Quit date: 04/26/2007  . Smokeless tobacco: Never Used     Comment: QUIT 2009  . Alcohol Use: No  . Drug Use: No  . Sexual Activity: Not Asked     Comment: pt. given condoms   Other Topics Concern  . None   Social History Narrative   Alternate # [phone]   Review of Systems: Constitutional: Negative for fever, chills.  Respiratory: +cough and neg shortness of breath.  Cardiovascular: Negative for chest pain.   Objective:   Vital Signs: Filed Vitals:   07/31/15 1328  BP: 136/77  Pulse: 76  Temp: 97.8 F (36.6 C)  TempSrc: Oral  Height: 5\' 1"  (1.549 m)  Weight: 171 lb 12.8 oz (77.928 kg)  SpO2: 97%      BP Readings from Last 3 Encounters:  07/31/15 136/77  07/24/15 137/79  07/21/15 143/76    Physical Exam: Constitutional: Vital signs reviewed.  Patient is in NAD  and cooperative with exam.  Head: Normocephalic and atraumatic. Eyes: EOMI, conjunctivae nl, no scleral icterus.  Cardiovascular: RRR, no MRG. Pulmonary/Chest: normal effort, CTAB, no wheezes, rales, or rhonchi. Neurological: A&O x3, cranial nerves II-XII are grossly intact, moving all extremities. Skin: Warm, dry and intact.    Assessment & Plan:   Assessment and plan was discussed and formulated with my attending.

## 2015-07-31 NOTE — Assessment & Plan Note (Signed)
Pt is taking 73meq of potassium daily and is questioning whether she should continue.    BMP Latest Ref Rng 07/21/2015 05/31/2015 03/09/2015  Glucose 65 - 99 mg/dL 114(H) 159(H) 116(H)  BUN 6 - 20 mg/dL 8 6 11   Creatinine 0.44 - 1.00 mg/dL 0.92 0.76 0.89  Sodium 135 - 145 mmol/L 138 137 140  Potassium 3.5 - 5.1 mmol/L 3.7 3.4(L) 3.9  Chloride 101 - 111 mmol/L 102 100(L) 103  CO2 22 - 32 mmol/L 27 24 26   Calcium 8.9 - 10.3 mg/dL 9.3 8.9 9.0   -advised to cont K+ for now and discuss with Dr. Randell Patient

## 2015-07-31 NOTE — Patient Instructions (Signed)
Thank you for your visit today.   Please return to the internal medicine clinic in about 1 month or sooner if needed to see Dr. Randell Patient.    I have made the following additions/changes to your medications:  Continue using the neti-pot. Continue potassium supplementation for now and discuss with Dr. Randell Patient.   Please be sure to bring all of your medications with you to every visit; this includes herbal supplements, vitamins, eye drops, and any over-the-counter medications.   Should you have any questions regarding your medications and/or any new or worsening symptoms, please be sure to call the clinic at [phone].   If you believe that you are suffering from a life threatening condition or one that may result in the loss of limb or function, then you should call 911 and proceed to the nearest Emergency Department.   A healthy lifestyle and preventative care can promote health and wellness.   Maintain regular health, dental, and eye exams.  Eat a healthy diet. Foods like vegetables, fruits, whole grains, low-fat dairy products, and lean protein foods contain the nutrients you need without too many calories. Decrease your intake of foods high in solid fats, added sugars, and salt. Get information about a proper diet from your caregiver, if necessary.  Regular physical exercise is one of the most important things you can do for your health. Most adults should get at least 150 minutes of moderate-intensity exercise (any activity that increases your heart rate and causes you to sweat) each week. In addition, most adults need muscle-strengthening exercises on 2 or more days a week.   Maintain a healthy weight. The body mass index (BMI) is a screening tool to identify possible weight problems. It provides an estimate of body fat based on height and weight. Your caregiver can help determine your BMI, and can help you achieve or maintain a healthy weight. For adults 20 years and older:  A BMI below  18.5 is considered underweight.  A BMI of 18.5 to 24.9 is normal.  A BMI of 25 to 29.9 is considered overweight.  A BMI of 30 and above is considered obese.

## 2015-08-04 NOTE — Progress Notes (Signed)
Case discussed with Dr. Gill soon after the resident saw the patient.  We reviewed the resident's history and exam and pertinent patient test results.  I agree with the assessment, diagnosis, and plan of care documented in the resident's note. 

## 2015-08-12 ENCOUNTER — Other Ambulatory Visit: Payer: Self-pay | Admitting: Pulmonary Disease

## 2015-08-13 ENCOUNTER — Other Ambulatory Visit: Payer: Self-pay

## 2015-08-14 MED ORDER — DIPHENHYDRAMINE HCL 25 MG PO TABS: ORAL_TABLET | ORAL | Status: DC

## 2015-08-27 ENCOUNTER — Ambulatory Visit: Payer: Medicaid Other | Admitting: Cardiovascular Disease

## 2015-09-01 NOTE — Progress Notes (Signed)
[name], female   DOB: 1960-12-31, 55 y.o.   MRN: [id number]     Cardiology Office Note   Date:  09/02/2015   ID:  [name] [name], DOB 12/30/1960, MRN [id number]  PCP:  Jacques Earthly, MD  Cardiologist:   Jenkins Rouge, MD   Chief Complaint  Patient presents with  . Hypertension    SOB      History of Present Illness: [name] [name] is a 54 y.o. female who presents for evaluation of atypical chest pain.  History of adrenal insufficiency, HIV, Hepatitis C , pituitary microadenoma and pancreatitis.  She is a Acupuncturist witness and will not use blood products Pain since diagnosed with addisons.  Exertional usually.  Not positional sometimes pleuritic  No rest pain  Pain in chest started with all the cramping pain she had during diagnosis of Addison's that was worse in abdomen and legs.  Noted some LE edema since started on steroids.  On norvasc for years   CRF: HTN and elevated lipids  Studies Reviewed  Had normal stress echo in 2013  Normal ABI's done for leg cramps 07/25/14   Normal LLE Venous Duplex 05/27/14 Stress Echo 09/2014 Normal   Seen in ER 2/5  URI nausea atypical chest pain not related to heart URI again March seen by primary and had course of zithromycin and augmentin   Past Medical History  Diagnosis Date  . HIV infection ([place])     1994  . Hepatitis C     genotype 1b, 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 s/p 12 week course of simeprevir and sofosbuvir between October 2014 and January 2015 with resolution.  . Hypertension   . GERD (gastroesophageal reflux disease)   . Depression   . Chronic back pain   . Hyperlipidemia   . History of shingles   . Refusal of blood transfusions as patient is Jehovah's Witness   . Prediabetes   . Allergic rhinitis 05/09/2006  . Pituitary microadenoma ([place]) 08/08/2014  . Secondary adrenal insufficiency ([place]) 06/29/2014    Past Surgical History  Procedure Laterality Date  . Colectomy      2003 for  diverticulitis  . Hand surgery    . Bunionectomy      b/l  . Shoulder surgery      left  . Tonsillectomy    . Nasal sinus surgery       Current Outpatient Prescriptions  Medication Sig Dispense Refill  . albuterol (PROAIR HFA) 108 (90 BASE) MCG/ACT inhaler Inhale 1-2 puffs into the lungs every 6 (six) hours as needed for wheezing or shortness of breath. 8.5 g 3  . amLODipine (NORVASC) 5 MG tablet TAKE 1 TABLET BY MOUTH ONCE DAILY 90 tablet 2  . cetirizine (ZYRTEC) 10 MG tablet Take 10 mg by mouth daily.    . chlorhexidine (PERIDEX) 0.12 % solution Use as directed 15 mLs in the mouth or throat 2 (two) times daily.    . ciprofloxacin-dexamethasone (CIPRODEX) otic suspension Place 4 drops into both ears 2 (two) times daily.    . darunavir-cobicistat (PREZCOBIX) 800-150 MG tablet Take 1 tablet by mouth daily. Swallow whole. Do NOT crush, break or chew tablets. Take with food. 90 tablet 3  . diazepam (VALIUM) 5 MG tablet Take 1 tablet (5 mg total) by mouth at bedtime as needed for anxiety. 20 tablet 0  . diphenhydrAMINE (BENADRYL) 25 MG tablet TAKE 1-2 TABLETS BY MOUTH EVERY NIGHT AT BEDTIME AS NEEDED FOR ITCHING  20 tablet 1  . guaiFENesin-codeine 100-10 MG/5ML syrup Take 10 mLs by mouth every 6 (six) hours as needed for cough. 120 mL 0  . hydrocortisone (CORTEF) 10 MG tablet TAKE 1 TABLET BY MOUTH IN THE AM, 1/2 TAB BY MOUTH @ 11 AM & 1/2 TAB BY MOUTH @ 3 PM    . hydrOXYzine (ATARAX/VISTARIL) 25 MG tablet Take 25 mg by mouth 3 (three) times daily as needed for anxiety.    Marland Kitchen ibuprofen (ADVIL,MOTRIN) 800 MG tablet Take 1 tablet (800 mg total) by mouth 3 (three) times daily. 21 tablet 0  . metoprolol tartrate (LOPRESSOR) 25 MG tablet TAKE ONE TABLET BY MOUTH TWO TIMES DAILY. 60 tablet 1  . montelukast (SINGULAIR) 10 MG tablet TAKE 1 TABLET BY MOUTH EVERY DAY *NEED OFFICE VISIT FOR FURTHER REFILLS* 30 tablet 5  . nitroGLYCERIN (NITROSTAT) 0.4 MG SL tablet Place 1 tablet (0.4 mg total) under the  tongue every 5 (five) minutes as needed for chest pain. 25 tablet 3  . Olopatadine HCl (PATADAY) 0.2 % SOLN Apply 1 drop to eye daily as needed. For allergies     . pantoprazole (PROTONIX) 20 MG tablet TAKE 1 TABLET BY MOUTH TWICE DAILY 60 tablet 3  . potassium chloride SA (K-DUR,KLOR-CON) 20 MEQ tablet Take 2 tablets (40 mEq total) by mouth daily. 8 tablet 0  . promethazine (PHENERGAN) 25 MG tablet Take 1 tablet (25 mg total) by mouth every 6 (six) hours as needed for nausea or vomiting. 30 tablet 0  . traMADol (ULTRAM) 50 MG tablet TAKE 2 TABLETS BY MOUTH EVERY 12 HOURS AS NEEDED FOR FOR PAIN *MUST LAST 30 DAYS* 60 tablet 0  . TRUVADA 200-300 MG tablet TAKE 1 TABLET BY MOUTH EVERY DAY 90 tablet 3  . Vitamin D, Cholecalciferol, 1000 UNITS CAPS Take 1,000 mg by mouth daily. 90 capsule 1  . [DISCONTINUED] hydrochlorothiazide (HYDRODIURIL) 25 MG tablet Take 25 mg by mouth daily.     No current facility-administered medications for this visit.    Allergies:   Acetaminophen; Dyazide; Lisinopril; Triamterene; and Morphine    Social History:  The patient  reports that she quit smoking about 8 years ago. Her smoking use included Cigarettes. She has never used smokeless tobacco. She reports that she does not drink alcohol or use illicit drugs.   Family History:  The patient's family history includes Cancer in her sister; Colon polyps in her brother; Diabetes in her father and mother; Heart disease in her father and mother; Hepatitis in her sister; Stroke in her father, mother, and other.    ROS:  Please see the history of present illness.   Otherwise, review of systems are positive for none.   All other systems are reviewed and negative.    PHYSICAL EXAM: VS:  BP 122/80 mmHg  Pulse 81  Ht 5\' 1"  (1.549 m)  Wt 79.561 kg (175 lb 6.4 oz)  BMI 33.16 kg/m2  SpO2 98% , BMI Body mass index is 33.16 kg/(m^2). Affect appropriate Overweight black female  HEENT: normal Neck supple with no  adenopathy JVP normal no bruits no thyromegaly Lungs clear with no wheezing and good diaphragmatic motion Heart:  S1/S2 no murmur, no rub, gallop or click PMI normal Abdomen: benighn, BS positve, no tenderness, no AAA no bruit.  No HSM or HJR Distal pulses intact with no bruits Trace LLE edema Neuro non-focal Skin warm and dry No muscular weakness    EKG:  08/18/14  SR rate 95  Normal  ECG  05/31/15  SR rate 93 nonspecific ST changes    Recent Labs: 03/09/2015: ALT 11 07/21/2015: BUN 8; Creatinine, Ser 0.92; Hemoglobin 12.7; Platelets 279; Potassium 3.7; Sodium 138    Lipid Panel    Component Value Date/Time   CHOL 186 03/09/2015 1557   TRIG 350* 03/09/2015 1557   HDL 30* 03/09/2015 1557   CHOLHDL 6.2* 03/09/2015 1557   VLDL 70* 03/09/2015 1557   LDLCALC 86 03/09/2015 1557      Wt Readings from Last 3 Encounters:  09/02/15 79.561 kg (175 lb 6.4 oz)  07/31/15 77.928 kg (171 lb 12.8 oz)  07/24/15 78.518 kg (173 lb 1.6 oz)      Other studies Reviewed: Additional studies/ records that were reviewed today include: Hospital records in [place] .    ASSESSMENT AND PLAN:  1.  Chest Pain:  Normal stress echo June 2016   Recent episode in ER from URI and not cardiac  2. Addisons  Discussed stress does steroids  F/u endocrine Currently on stress dose from cold 3. HTN;  Well controlled.  Continue current medications and low sodium Dash type diet.   4. URI f/u primary no wheezing continue flonase, allegra and zpack   Current medicines are reviewed at length with the patient today.  The patient does not have concerns regarding medicines.  The following changes have been made:  SL nitro called in   Labs/ tests ordered today include:   No orders of the defined types were placed in this encounter.     Disposition:   FU with me in a year     Signed, Jenkins Rouge, MD  09/02/2015 11:02 AM    [address], [place], [place]  [address] Phone:  [phone]; Fax: [phone]

## 2015-09-02 ENCOUNTER — Ambulatory Visit (INDEPENDENT_AMBULATORY_CARE_PROVIDER_SITE_OTHER): Payer: Medicaid Other | Admitting: Cardiovascular Disease

## 2015-09-02 ENCOUNTER — Encounter: Payer: Self-pay | Admitting: Cardiovascular Disease

## 2015-09-02 VITALS — BP 122/80 | HR 81 | Ht 61.0 in | Wt 175.4 lb

## 2015-09-02 DIAGNOSIS — R0789 Other chest pain: Secondary | ICD-10-CM

## 2015-09-02 NOTE — Patient Instructions (Addendum)
Medication Instructions:  Your physician recommends that you continue on your current medications as directed. Please refer to the Current Medication list given to you today.   Labwork: None  Testing/Procedures: None  Follow-Up: Your physician wants you to follow-up in: 12 months with Dr. Blima Singer will receive a reminder letter in the mail two months in advance. If you don't receive a letter, please call our office to schedule the follow-up appointment.   Any Other Special Instructions Will Be Listed Below (If Applicable).  NONE   If you need a refill on your cardiac medications before your next appointment, please call your pharmacy.

## 2015-09-03 ENCOUNTER — Ambulatory Visit: Payer: Medicaid Other | Admitting: Cardiovascular Disease

## 2015-09-04 ENCOUNTER — Other Ambulatory Visit: Payer: Self-pay | Admitting: Pulmonary Disease

## 2015-09-07 NOTE — Telephone Encounter (Signed)
Called into [facility]

## 2015-09-09 ENCOUNTER — Encounter (HOSPITAL_COMMUNITY): Payer: Self-pay

## 2015-09-09 ENCOUNTER — Emergency Department (HOSPITAL_COMMUNITY): Payer: Medicaid Other

## 2015-09-09 ENCOUNTER — Emergency Department (HOSPITAL_COMMUNITY)
Admission: EM | Admit: 2015-09-09 | Discharge: 2015-09-09 | Disposition: A | Discharge status: Home / Self Care | Payer: Medicaid Other | Attending: Emergency Medicine | Admitting: Emergency Medicine

## 2015-09-09 DIAGNOSIS — Z87891 Personal history of nicotine dependence: Secondary | ICD-10-CM | POA: Diagnosis not present

## 2015-09-09 DIAGNOSIS — R110 Nausea: Secondary | ICD-10-CM | POA: Insufficient documentation

## 2015-09-09 DIAGNOSIS — B20 Human immunodeficiency virus [HIV] disease: Secondary | ICD-10-CM | POA: Insufficient documentation

## 2015-09-09 DIAGNOSIS — R0981 Nasal congestion: Secondary | ICD-10-CM | POA: Insufficient documentation

## 2015-09-09 DIAGNOSIS — Z8639 Personal history of other endocrine, nutritional and metabolic disease: Secondary | ICD-10-CM | POA: Diagnosis not present

## 2015-09-09 DIAGNOSIS — R05 Cough: Secondary | ICD-10-CM | POA: Insufficient documentation

## 2015-09-09 DIAGNOSIS — Z86018 Personal history of other benign neoplasm: Secondary | ICD-10-CM | POA: Diagnosis not present

## 2015-09-09 DIAGNOSIS — R5383 Other fatigue: Secondary | ICD-10-CM | POA: Insufficient documentation

## 2015-09-09 DIAGNOSIS — I10 Essential (primary) hypertension: Secondary | ICD-10-CM | POA: Diagnosis not present

## 2015-09-09 DIAGNOSIS — Z79899 Other long term (current) drug therapy: Secondary | ICD-10-CM | POA: Insufficient documentation

## 2015-09-09 DIAGNOSIS — G8929 Other chronic pain: Secondary | ICD-10-CM | POA: Insufficient documentation

## 2015-09-09 DIAGNOSIS — R531 Weakness: Secondary | ICD-10-CM | POA: Diagnosis not present

## 2015-09-09 DIAGNOSIS — R42 Dizziness and giddiness: Secondary | ICD-10-CM | POA: Insufficient documentation

## 2015-09-09 DIAGNOSIS — K219 Gastro-esophageal reflux disease without esophagitis: Secondary | ICD-10-CM | POA: Diagnosis not present

## 2015-09-09 LAB — URINALYSIS, ROUTINE W REFLEX MICROSCOPIC
BILIRUBIN URINE: NEGATIVE
Glucose, UA: 250 mg/dL — AB
HGB URINE DIPSTICK: NEGATIVE
KETONES UR: NEGATIVE mg/dL
Leukocytes, UA: NEGATIVE
NITRITE: NEGATIVE
PH: 7 (ref 5.0–8.0)
Protein, ur: NEGATIVE mg/dL
SPECIFIC GRAVITY, URINE: 1.012 (ref 1.005–1.030)

## 2015-09-09 LAB — CBC WITH DIFFERENTIAL/PLATELET
BASOS ABS: 0 10*3/uL (ref 0.0–0.1)
BASOS PCT: 0 %
EOS PCT: 2 %
Eosinophils Absolute: 0.1 10*3/uL (ref 0.0–0.7)
HEMATOCRIT: 42.2 % (ref 36.0–46.0)
Hemoglobin: 13.6 g/dL (ref 12.0–15.0)
LYMPHS PCT: 32 %
Lymphs Abs: 2.6 10*3/uL (ref 0.7–4.0)
MCH: 26.1 pg (ref 26.0–34.0)
MCHC: 32.2 g/dL (ref 30.0–36.0)
MCV: 81 fL (ref 78.0–100.0)
MONO ABS: 0.5 10*3/uL (ref 0.1–1.0)
MONOS PCT: 6 %
NEUTROS ABS: 4.9 10*3/uL (ref 1.7–7.7)
Neutrophils Relative %: 60 %
PLATELETS: 269 10*3/uL (ref 150–400)
RBC: 5.21 MIL/uL — ABNORMAL HIGH (ref 3.87–5.11)
RDW: 17.2 % — AB (ref 11.5–15.5)
WBC: 8.2 10*3/uL (ref 4.0–10.5)

## 2015-09-09 LAB — COMPREHENSIVE METABOLIC PANEL
ALBUMIN: 3.7 g/dL (ref 3.5–5.0)
ALT: 17 U/L (ref 14–54)
ANION GAP: 16 — AB (ref 5–15)
AST: 38 U/L (ref 15–41)
Alkaline Phosphatase: 96 U/L (ref 38–126)
BUN: 8 mg/dL (ref 6–20)
CO2: 21 mmol/L — AB (ref 22–32)
Calcium: 9.1 mg/dL (ref 8.9–10.3)
Chloride: 100 mmol/L — ABNORMAL LOW (ref 101–111)
Creatinine, Ser: 0.8 mg/dL (ref 0.44–1.00)
GFR calc Af Amer: >60 mL/min (ref >60)
GFR calc non Af Amer: >60 mL/min (ref >60)
GLUCOSE: 236 mg/dL — AB (ref 65–99)
POTASSIUM: 4 mmol/L (ref 3.5–5.1)
SODIUM: 137 mmol/L (ref 135–145)
TOTAL PROTEIN: 7.3 g/dL (ref 6.5–8.1)
Total Bilirubin: 1 mg/dL (ref 0.3–1.2)

## 2015-09-09 LAB — LIPASE, BLOOD: Lipase: 65 U/L — ABNORMAL HIGH (ref 11–51)

## 2015-09-09 LAB — I-STAT TROPONIN, ED: TROPONIN I, POC: 0 ng/mL (ref 0.00–0.08)

## 2015-09-09 MED ORDER — SODIUM CHLORIDE 0.9 % IV BOLUS (SEPSIS)
1000.0000 mL | Freq: Once | INTRAVENOUS | Status: AC
  Administered 2015-09-09: 1000 mL via INTRAVENOUS

## 2015-09-09 MED ORDER — ONDANSETRON HCL 4 MG/2ML IJ SOLN
4.0000 mg | Freq: Once | INTRAMUSCULAR | Status: AC
  Administered 2015-09-09: 4 mg via INTRAVENOUS
  Filled 2015-09-09: qty 2

## 2015-09-09 NOTE — ED Notes (Signed)
Per EMS - hx addison's and HIV. Pt reports generalized weakness x last few days. Normally has some blurred vision but increased over last few days. Pt states she does not feel like herself. Reports feeling this way in past when steroids aren't right.

## 2015-09-09 NOTE — Discharge Instructions (Signed)
Fatigue  Fatigue is feeling tired all of the time, a lack of energy, or a lack of motivation. Occasional or mild fatigue is often a normal response to activity or life in general. However, long-lasting (chronic) or extreme fatigue may indicate an underlying medical condition.  HOME CARE INSTRUCTIONS   Watch your fatigue for any changes. The following actions may help to lessen any discomfort you are feeling:  · Talk to your health care provider about how much sleep you need each night. Try to get the required amount every night.  · Take medicines only as directed by your health care provider.  · Eat a healthy and nutritious diet. Ask your health care provider if you need help changing your diet.  · Drink enough fluid to keep your urine clear or pale yellow.  · Practice ways of relaxing, such as yoga, meditation, massage therapy, or acupuncture.  · Exercise regularly.    · Change situations that cause you stress. Try to keep your work and personal routine reasonable.  · Do not abuse illegal drugs.  · Limit alcohol intake to no more than 1 drink per day for nonpregnant women and 2 drinks per day for men. One drink equals 12 ounces of beer, 5 ounces of wine, or 1½ ounces of hard liquor.  · Take a multivitamin, if directed by your health care provider.  SEEK MEDICAL CARE IF:   · Your fatigue does not get better.  · You have a fever.    · You have unintentional weight loss or gain.  · You have headaches.    · You have difficulty:      Falling asleep.    Sleeping throughout the night.  · You feel angry, guilty, anxious, or sad.     · You are unable to have a bowel movement (constipation).    · You skin is dry.     · Your legs or another part of your body is swollen.    SEEK IMMEDIATE MEDICAL CARE IF:   · You feel confused.    · Your vision is blurry.  · You feel faint or pass out.    · You have a severe headache.    · You have severe abdominal, pelvic, or back pain.    · You have chest pain, shortness of breath, or an  irregular or fast heartbeat.    · You are unable to urinate or you urinate less than normal.    · You develop abnormal bleeding, such as bleeding from the rectum, vagina, nose, lungs, or nipples.  · You vomit blood.     · You have thoughts about harming yourself or committing suicide.    · You are worried that you might harm someone else.       This information is not intended to replace advice given to you by your health care provider. Make sure you discuss any questions you have with your health care provider.     Document Released: 02/06/2007 Document Revised: 05/02/2014 Document Reviewed: 08/13/2013  Elsevier Interactive Patient Education ©2016 [employer]

## 2015-09-09 NOTE — ED Notes (Signed)
Pt given sprite for fluid challenge °

## 2015-09-09 NOTE — ED Provider Notes (Signed)
CSN: [id number]     Arrival date & time 09/09/15  [id number] History   First MD Initiated Contact with Patient 09/09/15 [phone]     Chief Complaint  Patient presents with  . Fatigue  . HIV Positive/AIDS  . Dizziness     (Consider location/radiation/quality/duration/timing/severity/associated sxs/prior Treatment) Patient is a 55 y.o. female presenting with HIV/AIDS, dizziness, and general illness. The history is provided by the patient.  HIV Positive/AIDS Pertinent negatives include no chest pain, no headaches and no shortness of breath.  Dizziness Associated symptoms: weakness   Associated symptoms: no chest pain, no headaches, no nausea, no palpitations, no shortness of breath and no vomiting   Illness Severity:  Moderate Onset quality:  Gradual Duration:  2 days Timing:  Constant Progression:  Worsening Chronicity:  Recurrent Associated symptoms: congestion, cough and fatigue   Associated symptoms: no chest pain, no fever, no headaches, no myalgias, no nausea, no rhinorrhea, no shortness of breath, no vomiting and no wheezing    55 yo F With a chief complaints of syncopal event. Patient has been feeling poor over the past couple days. Feels like she's been out in and drink normally. Having some nausea that is slightly worse than her chronic nausea. Denies chest pain or shortness of breath. Has had some cough and congestion that she attributes to allergies. Denies sick contacts. Last CD4 count was unmeasurable low. Has been taking all of her medications. With her syncopal event today she was sitting in a chair and felt that she was worsengly dizzy she called her relative and then she woke up in the relative was in her home. She feels that she likely passed out.  Past Medical History  Diagnosis Date  . HIV infection ([place])     1994  . Hepatitis C     genotype 1b, 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 s/p 12 week course of simeprevir and sofosbuvir between October 2014 and January  2015 with resolution.  . Hypertension   . GERD (gastroesophageal reflux disease)   . Depression   . Chronic back pain   . Hyperlipidemia   . History of shingles   . Refusal of blood transfusions as patient is Jehovah's Witness   . Prediabetes   . Allergic rhinitis 05/09/2006  . Pituitary microadenoma ([place]) 08/08/2014  . Secondary adrenal insufficiency ([place]) 06/29/2014   Past Surgical History  Procedure Laterality Date  . Colectomy      2003 for diverticulitis  . Hand surgery    . Bunionectomy      b/l  . Shoulder surgery      left  . Tonsillectomy    . Nasal sinus surgery     Family History  Problem Relation Age of Onset  . Heart disease Mother   . Diabetes Mother   . Stroke Mother   . Heart disease Father   . Stroke Father   . Diabetes Father   . Hepatitis Sister     hcv  . Stroke Other   . Colon polyps Brother   . Cancer Sister     lung   Social History  Substance Use Topics  . Smoking status: Former Smoker    Types: Cigarettes    Quit date: 04/26/2007  . Smokeless tobacco: Never Used     Comment: QUIT 2009  . Alcohol Use: No   OB History    No data available     Review of Systems  Constitutional: Positive for fatigue. Negative for fever and  chills.  HENT: Positive for congestion. Negative for rhinorrhea.   Eyes: Negative for redness and visual disturbance.  Respiratory: Positive for cough. Negative for shortness of breath and wheezing.   Cardiovascular: Negative for chest pain and palpitations.  Gastrointestinal: Negative for nausea and vomiting.  Genitourinary: Negative for dysuria and urgency.  Musculoskeletal: Negative for myalgias and arthralgias.  Skin: Negative for pallor and wound.  Neurological: Positive for weakness. Negative for dizziness and headaches.      Allergies  Acetaminophen; Dyazide; Lisinopril; Triamterene; and Morphine  Home Medications   Prior to Admission medications   Medication Sig Start Date End Date Taking?  Authorizing Provider  albuterol (PROAIR HFA) 108 (90 BASE) MCG/ACT inhaler Inhale 1-2 puffs into the lungs every 6 (six) hours as needed for wheezing or shortness of breath. 03/26/15  Yes Milagros Loll, MD  amLODipine (NORVASC) 5 MG tablet TAKE 1 TABLET BY MOUTH ONCE DAILY 02/24/15  Yes Milagros Loll, MD  cetirizine (ZYRTEC) 10 MG tablet Take 10 mg by mouth daily.   Yes Historical Provider, MD  chlorhexidine (PERIDEX) 0.12 % solution Use as directed 15 mLs in the mouth or throat 2 (two) times daily.   Yes Historical Provider, MD  darunavir-cobicistat (PREZCOBIX) 800-150 MG tablet Take 1 tablet by mouth daily. Swallow whole. Do NOT crush, break or chew tablets. Take with food. 07/15/15  Yes Campbell Riches, MD  diazepam (VALIUM) 5 MG tablet Take 1 tablet (5 mg total) by mouth at bedtime as needed for anxiety. May fill after 09/15/2015 09/04/15  Yes Milagros Loll, MD  diphenhydrAMINE (BENADRYL) 25 MG tablet TAKE 1-2 TABLETS BY MOUTH EVERY NIGHT AT BEDTIME AS NEEDED FOR ITCHING 08/14/15  Yes Milagros Loll, MD  hydrocortisone (CORTEF) 10 MG tablet TAKE 1 TABLET BY MOUTH IN THE AM, 1/2 TAB BY MOUTH @ 11 AM & 1/2 TAB BY MOUTH @ 3 PM   Yes Historical Provider, MD  hydrOXYzine (ATARAX/VISTARIL) 25 MG tablet Take 25 mg by mouth 3 (three) times daily as needed for anxiety.   Yes Historical Provider, MD  metoprolol tartrate (LOPRESSOR) 25 MG tablet TAKE ONE TABLET BY MOUTH TWO TIMES DAILY. 08/13/15  Yes Milagros Loll, MD  montelukast (SINGULAIR) 10 MG tablet TAKE 1 TABLET BY MOUTH EVERY DAY *NEED OFFICE VISIT FOR FURTHER REFILLS* 05/20/15  Yes Jiles Prows, MD  nitroGLYCERIN (NITROSTAT) 0.4 MG SL tablet Place 1 tablet (0.4 mg total) under the tongue every 5 (five) minutes as needed for chest pain. 09/24/14  Yes Josue Hector, MD  Olopatadine HCl (PATADAY) 0.2 % SOLN Apply 1 drop to eye daily as needed. For allergies    Yes Historical Provider, MD  pantoprazole (PROTONIX) 20 MG tablet TAKE 1 TABLET BY  MOUTH TWICE DAILY 08/03/15  Yes Milagros Loll, MD  promethazine (PHENERGAN) 25 MG tablet Take 1 tablet (25 mg total) by mouth every 6 (six) hours as needed for nausea or vomiting. 05/31/15  Yes Olivia Canter Sam, PA-C  traMADol (ULTRAM) 50 MG tablet TAKE 2 TABLETS BY MOUTH EVERY 12 HOURS AS NEEDED FOR FOR PAIN *MUST LAST 30 DAYS* 04/22/15  Yes Milagros Loll, MD  TRUVADA 200-300 MG tablet TAKE 1 TABLET BY MOUTH EVERY DAY 07/15/15  Yes Campbell Riches, MD  Vitamin D, Cholecalciferol, 1000 UNITS CAPS Take 1,000 mg by mouth daily. 12/04/14  Yes Milagros Loll, MD  guaiFENesin-codeine 100-10 MG/5ML syrup Take 10 mLs by mouth every 6 (six) hours as needed for cough.  Patient not taking: Reported on 09/09/2015 07/21/15   Corky Sox, MD  ibuprofen (ADVIL,MOTRIN) 800 MG tablet Take 1 tablet (800 mg total) by mouth 3 (three) times daily. Patient not taking: Reported on 09/09/2015 05/31/15   Olivia Canter Sam, PA-C  potassium chloride SA (K-DUR,KLOR-CON) 20 MEQ tablet Take 2 tablets (40 mEq total) by mouth daily. Patient not taking: Reported on 09/09/2015 05/31/15   Olivia Canter Sam, PA-C   BP 121/69 mmHg  Pulse 73  Temp(Src) 98.6 F (37 C)  Resp 17  SpO2 100% Physical Exam  Constitutional: She is oriented to person, place, and time. She appears well-developed and well-nourished. No distress.  HENT:  Head: Normocephalic and atraumatic.  Swollen turbinates, rhinorrhea  Eyes: EOM are normal. Pupils are equal, round, and reactive to light.  Neck: Normal range of motion. Neck supple.  Cardiovascular: Normal rate and regular rhythm.  Exam reveals no gallop and no friction rub.   No murmur heard. Pulmonary/Chest: Effort normal. She has no wheezes. She has no rales.  Abdominal: Soft. She exhibits distension (chronic per patient). There is no tenderness. There is no rebound and no guarding.  Musculoskeletal: She exhibits no edema or tenderness.  Neurological: She is alert and oriented to person, place, and time.  Skin:  Skin is warm and dry. She is not diaphoretic.  Psychiatric: She has a normal mood and affect. Her behavior is normal.  Nursing note and vitals reviewed.   ED Course  Procedures (including critical care time) Labs Review Labs Reviewed  CBC WITH DIFFERENTIAL/PLATELET - Abnormal; Notable for the following:    RBC 5.21 (*)    RDW 17.2 (*)    All other components within normal limits  COMPREHENSIVE METABOLIC PANEL - Abnormal; Notable for the following:    Chloride 100 (*)    CO2 21 (*)    Glucose, Bld 236 (*)    Anion gap 16 (*)    All other components within normal limits  LIPASE, BLOOD - Abnormal; Notable for the following:    Lipase 65 (*)    All other components within normal limits  URINALYSIS, ROUTINE W REFLEX MICROSCOPIC (NOT AT [facility] - Abnormal; Notable for the following:    Glucose, UA 250 (*)    All other components within normal limits  CULTURE, BLOOD (ROUTINE X 2)  CULTURE, BLOOD (ROUTINE X 2)  I-STAT TROPOININ, ED    Imaging Review Dg Chest 2 View  09/09/2015  CLINICAL DATA:  Weakness for 1 week.  Episodes of dizziness. EXAM: CHEST  2 VIEW COMPARISON:  07/21/2015 FINDINGS: The cardiomediastinal silhouette is within normal limits. The lungs are well inflated without evidence of airspace consolidation, edema, pleural effusion, or pneumothorax. No acute osseous abnormality is identified. IMPRESSION: No active cardiopulmonary disease. Electronically Signed   By: Logan Bores M.D.   On: 09/09/2015 10:26   I have personally reviewed and evaluated these images and lab results as part of my medical decision-making.   EKG Interpretation   Date/Time:  Wednesday Sep 09 2015 09:52:13 EDT Ventricular Rate:  81 PR Interval:  182 QRS Duration: 99 QT Interval:  428 QTC Calculation: 497 R Axis:   65 Text Interpretation:  Sinus rhythm Borderline prolonged QT interval  Baseline wander in lead(s) V2 No significant change since last tracing  Confirmed by Lavell Supple MD, DANIEL [id number]) on  09/09/2015 10:22:34 AM      MDM   Final diagnoses:  Other fatigue    55 yo F with a chief complaint  of a syncopal event. Will obtain labs give IV fluids reassess.  Patient feeling mildly better. Laboratory evaluation reassuring. UA without infection. X-ray negative. Blood cultures pending. PCP follow-up in the next day or 2.  3:05 PM:  I have discussed the diagnosis/risks/treatment options with the patient and family and believe the pt to be eligible for discharge home to follow-up with PCP. We also discussed returning to the ED immediately if new or worsening sx occur. We discussed the sx which are most concerning (e.g., fever, sob, chest pain, abdominal pain, uncontrolled vomiting) that necessitate immediate return. Medications administered to the patient during their visit and any new prescriptions provided to the patient are listed below.  Medications given during this visit Medications  sodium chloride 0.9 % bolus 1,000 mL (0 mLs Intravenous Stopped 09/09/15 1200)  ondansetron (ZOFRAN) injection 4 mg (4 mg Intravenous Given 09/09/15 1154)    New Prescriptions   No medications on file    The patient appears reasonably screen and/or stabilized for discharge and I doubt any other medical condition or other [facility] requiring further screening, evaluation, or treatment in the ED at this time prior to discharge.      Deno Etienne, DO 09/09/15 1505

## 2015-09-09 NOTE — ED Notes (Signed)
Pt verbalized understanding of d/c instructionsand follow-up care. No further questions/concerns, VSS, assisted to lobby in wheelchair. 

## 2015-09-10 ENCOUNTER — Other Ambulatory Visit: Payer: Self-pay | Admitting: Pulmonary Disease

## 2015-09-11 ENCOUNTER — Other Ambulatory Visit: Payer: Self-pay | Admitting: Cardiovascular Disease

## 2015-09-11 MED ORDER — NITROGLYCERIN 0.4 MG SL SUBL: 0.4000 mg | SUBLINGUAL_TABLET | SUBLINGUAL | Status: DC | PRN

## 2015-09-11 NOTE — Telephone Encounter (Signed)
Last appt 4/7 with Dr Gordy Levan; no f/u appt was scheduled.

## 2015-09-14 LAB — CULTURE, BLOOD (ROUTINE X 2)
CULTURE: NO GROWTH
Culture: NO GROWTH

## 2015-09-14 NOTE — Telephone Encounter (Signed)
Tramadol rx called to [facility] on Friday 5/19.

## 2015-10-01 ENCOUNTER — Ambulatory Visit (INDEPENDENT_AMBULATORY_CARE_PROVIDER_SITE_OTHER): Payer: Medicaid Other | Admitting: Pulmonary Disease

## 2015-10-01 ENCOUNTER — Encounter: Payer: Self-pay | Admitting: Pulmonary Disease

## 2015-10-01 VITALS — BP 141/71 | HR 82 | Temp 98.1°F | Resp 18 | Ht 61.0 in | Wt 177.0 lb

## 2015-10-01 DIAGNOSIS — R7303 Prediabetes: Secondary | ICD-10-CM

## 2015-10-01 DIAGNOSIS — H8110 Benign paroxysmal vertigo, unspecified ear: Secondary | ICD-10-CM

## 2015-10-01 DIAGNOSIS — R739 Hyperglycemia, unspecified: Secondary | ICD-10-CM

## 2015-10-01 LAB — GLUCOSE, CAPILLARY: GLUCOSE-CAPILLARY: 159 mg/dL — AB (ref 65–99)

## 2015-10-01 LAB — POCT GLYCOSYLATED HEMOGLOBIN (HGB A1C): HEMOGLOBIN A1C: 6.2

## 2015-10-01 NOTE — Progress Notes (Signed)
Subjective:    [name], female    DOB: [birth year], 55 y.o.   MRN: [id number]  Dizziness   [name] [name] is a 54 year old woman with history of HIV, treated hepatitis C, liver fibrosis, hypertension, GERD, adrenal insufficiency, prediabetes, chronic back pain presenting follow up of dizziness.  She was seen in the ED 09/09/2015 for syncope. She received IV fluids which improved her symptoms. Saw Dr. Buddy Duty shortly after. He felt that it was maybe it was the high dose of steroids. Takes 10mg  in the morning and 5mg  at 11AM.   Has dizzy spells. No association with activity. Can happen at rest. Gets them intermittently about 2 times a day. Takes 5 or 10 minutes to pass. Feels like she is spinning sometimes. No syncope since May. Does not feel better with the dose change. Feels that maybe she was feeling better with the three times a day dosing. Having polyuria and polydipsia.   Is going to see Dr. Buddy Duty again in July.  Is going to try holding Valium for a day or two. Last took Benadryl a long time ago. Discontinued hydroxyzine. Takes phenergan 1/2 tablet with her steroid. Last took tramadol a week.   Review of Systems  Neurological: Positive for dizziness.  Constitutional: no fevers/chills Respiratory: no shortness of breath Cardiovascular: no chest pain Gastrointestinal: chronic nausea, no vomiting  Past Medical History  Diagnosis Date  . HIV infection ([place])     1994  . Hepatitis C     genotype 1b, 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 s/p 12 week course of simeprevir and sofosbuvir between October 2014 and January 2015 with resolution.  . Hypertension   . GERD (gastroesophageal reflux disease)   . Depression   . Chronic back pain   . Hyperlipidemia   . History of shingles   . Refusal of blood transfusions as patient is Jehovah's Witness   . Prediabetes   . Allergic rhinitis 05/09/2006  . Pituitary microadenoma ([place]) 08/08/2014  . Secondary adrenal  insufficiency ([place]) 06/29/2014    Current Outpatient Prescriptions on File Prior to Visit  Medication Sig Dispense Refill  . amLODipine (NORVASC) 5 MG tablet TAKE 1 TABLET BY MOUTH ONCE DAILY 90 tablet 2  . cetirizine (ZYRTEC) 10 MG tablet Take 10 mg by mouth daily.    . chlorhexidine (PERIDEX) 0.12 % solution Use as directed 15 mLs in the mouth or throat 2 (two) times daily.    . darunavir-cobicistat (PREZCOBIX) 800-150 MG tablet Take 1 tablet by mouth daily. Swallow whole. Do NOT crush, break or chew tablets. Take with food. 90 tablet 3  . diazepam (VALIUM) 5 MG tablet Take 1 tablet (5 mg total) by mouth at bedtime as needed for anxiety. May fill after 09/15/2015 20 tablet 0  . diphenhydrAMINE (BENADRYL) 25 MG tablet TAKE 1-2 TABLETS BY MOUTH EVERY NIGHT AT BEDTIME AS NEEDED FOR ITCHING 20 tablet 1  . guaiFENesin-codeine 100-10 MG/5ML syrup Take 10 mLs by mouth every 6 (six) hours as needed for cough. (Patient not taking: Reported on 09/09/2015) 120 mL 0  . hydrocortisone (CORTEF) 10 MG tablet TAKE 1 TABLET BY MOUTH IN THE AM, 1/2 TAB BY MOUTH @ 11 AM & 1/2 TAB BY MOUTH @ 3 PM    . hydrOXYzine (ATARAX/VISTARIL) 25 MG tablet Take 25 mg by mouth 3 (three) times daily as needed for anxiety.    Marland Kitchen ibuprofen (ADVIL,MOTRIN) 800 MG tablet Take 1 tablet (800 mg total) by  mouth 3 (three) times daily. (Patient not taking: Reported on 09/09/2015) 21 tablet 0  . metoprolol tartrate (LOPRESSOR) 25 MG tablet TAKE ONE TABLET BY MOUTH TWO TIMES DAILY. 60 tablet 1  . montelukast (SINGULAIR) 10 MG tablet TAKE 1 TABLET BY MOUTH EVERY DAY *NEED OFFICE VISIT FOR FURTHER REFILLS* 30 tablet 5  . nitroGLYCERIN (NITROSTAT) 0.4 MG SL tablet Place 1 tablet (0.4 mg total) under the tongue every 5 (five) minutes as needed for chest pain. 25 tablet 3  . Olopatadine HCl (PATADAY) 0.2 % SOLN Apply 1 drop to eye daily as needed. For allergies     . pantoprazole (PROTONIX) 20 MG tablet TAKE 1 TABLET BY MOUTH TWICE DAILY 60 tablet 3    . potassium chloride SA (K-DUR,KLOR-CON) 20 MEQ tablet Take 2 tablets (40 mEq total) by mouth daily. (Patient not taking: Reported on 09/09/2015) 8 tablet 0  . PROAIR HFA 108 (90 Base) MCG/ACT inhaler INHALE 1-2 PUFFS BY MOUTH INTO THE LUNGS EVERY 6 HOURS AS NEEDED FOR WHEEEZING AND SHORTNESS OF BREATH 8.5 each 2  . promethazine (PHENERGAN) 25 MG tablet Take 1 tablet (25 mg total) by mouth every 6 (six) hours as needed for nausea or vomiting. 30 tablet 0  . traMADol (ULTRAM) 50 MG tablet TAKE 2 TABLETS BY MOUTH EVERY 12 HOURS AS NEEDED FOR FOR PAIN *MUST LAST 30 DAYS* 60 tablet 0  . TRUVADA 200-300 MG tablet TAKE 1 TABLET BY MOUTH EVERY DAY 90 tablet 3  . Vitamin D, Cholecalciferol, 1000 UNITS CAPS Take 1,000 mg by mouth daily. 90 capsule 1  . [DISCONTINUED] hydrochlorothiazide (HYDRODIURIL) 25 MG tablet Take 25 mg by mouth daily.     No current facility-administered medications on file prior to visit.   Objective:   Physical Exam Blood pressure 141/71, pulse 82, temperature 98.1 F (36.7 C), temperature source Oral, resp. rate 18, height 5\' 1"  (1.549 m), weight 177 lb (80.287 kg), SpO2 98 %. General Apperance: NAD HEENT: Normocephalic, atraumatic, anicteric sclera Neck: Supple, trachea midline Lungs: Clear to auscultation bilaterally. No wheezes, rhonchi or rales. Breathing comfortably Heart: Regular rate and rhythm, no murmur/rub/gallop Abdomen: Soft, nontender, nondistended, no rebound/guarding Extremities: Warm and well perfused, no edema Skin: No rashes or lesions Neurologic: Alert and interactive. No gross deficits.    Assessment & Plan:  Please refer to problem based charting.

## 2015-10-01 NOTE — Patient Instructions (Signed)
Epley Maneuver Self-Care  WHAT IS THE EPLEY MANEUVER?  The Epley maneuver is an exercise you can do to relieve symptoms of benign paroxysmal positional vertigo (BPPV). This condition is often just referred to as vertigo. BPPV is caused by the movement of tiny crystals (canaliths) inside your inner ear. The accumulation and movement of canaliths in your inner ear causes a sudden spinning sensation (vertigo) when you move your head to certain positions. Vertigo usually lasts about 30 seconds. BPPV usually occurs in just one ear. If you get vertigo when you lie on your left side, you probably have BPPV in your left ear. Your health care provider can tell you which ear is involved.   BPPV may be caused by a head injury. Many people older than 50 get BPPV for unknown reasons. If you have been diagnosed with BPPV, your health care provider may teach you how to do this maneuver. BPPV is not life threatening (benign) and usually goes away in time.   WHEN SHOULD I PERFORM THE EPLEY MANEUVER?  You can do this maneuver at home whenever you have symptoms of vertigo. You may do the Epley maneuver up to 3 times a day until your symptoms of vertigo go away.  HOW SHOULD I DO THE EPLEY MANEUVER?  1. Sit on the edge of a bed or table with your back straight. Your legs should be extended or hanging over the edge of the bed or table.    2. Turn your head halfway toward the affected ear.    3. Lie backward quickly with your head turned until you are lying flat on your back. You may want to position a pillow under your shoulders.    4. Hold this position for 30 seconds. You may experience an attack of vertigo. This is normal. Hold this position until the vertigo stops.  5. Then turn your head to the opposite direction until your unaffected ear is facing the floor.    6. Hold this position for 30 seconds. You may experience an attack of vertigo. This is normal. Hold this position until the vertigo stops.  7. Now turn your whole body to  the same side as your head. Hold for another 30 seconds.    8. You can then sit back up.  ARE THERE RISKS TO THIS MANEUVER?  In some cases, you may have other symptoms (such as changes in your vision, weakness, or numbness). If you have these symptoms, stop doing the maneuver and call your health care provider. Even if doing these maneuvers relieves your vertigo, you may still have dizziness. Dizziness is the sensation of light-headedness but without the sensation of movement. Even though the Epley maneuver may relieve your vertigo, it is possible that your symptoms will return within 5 years.  WHAT SHOULD I DO AFTER THIS MANEUVER?  After doing the Epley maneuver, you can return to your normal activities. Ask your doctor if there is anything you should do at home to prevent vertigo. This may include:  · Sleeping with two or more pillows to keep your head elevated.  · Not sleeping on the side of your affected ear.  · Getting up slowly from bed.  · Avoiding sudden movements during the day.  · Avoiding extreme head movement, like looking up or bending over.  · Wearing a cervical collar to prevent sudden head movements.  WHAT SHOULD I DO IF MY SYMPTOMS GET WORSE?  Call your health care provider if your vertigo gets worse. Call your provider right way if   you have other symptoms, including:   · Nausea.  · Vomiting.  · Headache.  · Weakness.  · Numbness.  · Vision changes.     This information is not intended to replace advice given to you by your health care provider. Make sure you discuss any questions you have with your health care provider.     Document Released: 04/16/2013 Document Reviewed: 04/16/2013  Elsevier Interactive Patient Education ©2016 [employer]

## 2015-10-02 DIAGNOSIS — H8110 Benign paroxysmal vertigo, unspecified ear: Secondary | ICD-10-CM | POA: Insufficient documentation

## 2015-10-02 NOTE — Assessment & Plan Note (Signed)
Assessment: Vertigo episodes. Vertigo elicited on Dix Hallpike maneuver. Will also try holding Valium for a few days as well to see if this might help with her symptoms.  Plan: Educated patient on Epley maneuver.

## 2015-10-02 NOTE — Progress Notes (Signed)
Internal Medicine Clinic Attending  I saw and evaluated the patient.  I personally confirmed the key portions of the history and exam documented by Dr. Krall and I reviewed pertinent patient test results.  The assessment, diagnosis, and plan were formulated together and I agree with the documentation in the resident's note.  

## 2015-10-02 NOTE — Assessment & Plan Note (Signed)
A1c rechecked and she is 6.2%.

## 2015-10-08 ENCOUNTER — Encounter: Payer: Self-pay | Admitting: Physical Medicine & Rehabilitation

## 2015-10-08 ENCOUNTER — Ambulatory Visit (HOSPITAL_BASED_OUTPATIENT_CLINIC_OR_DEPARTMENT_OTHER): Payer: Medicaid Other | Admitting: Physical Medicine & Rehabilitation

## 2015-10-08 ENCOUNTER — Encounter: Payer: Medicaid Other | Attending: Physical Medicine & Rehabilitation

## 2015-10-08 ENCOUNTER — Encounter: Payer: Medicaid Other | Admitting: Registered Nurse

## 2015-10-08 VITALS — BP 138/91 | HR 75 | Resp 14

## 2015-10-08 DIAGNOSIS — E785 Hyperlipidemia, unspecified: Secondary | ICD-10-CM | POA: Diagnosis not present

## 2015-10-08 DIAGNOSIS — M47816 Spondylosis without myelopathy or radiculopathy, lumbar region: Secondary | ICD-10-CM | POA: Diagnosis not present

## 2015-10-08 DIAGNOSIS — I10 Essential (primary) hypertension: Secondary | ICD-10-CM | POA: Insufficient documentation

## 2015-10-08 DIAGNOSIS — B20 Human immunodeficiency virus [HIV] disease: Secondary | ICD-10-CM | POA: Diagnosis not present

## 2015-10-08 DIAGNOSIS — G8929 Other chronic pain: Secondary | ICD-10-CM | POA: Diagnosis not present

## 2015-10-08 DIAGNOSIS — Z87891 Personal history of nicotine dependence: Secondary | ICD-10-CM | POA: Diagnosis not present

## 2015-10-08 DIAGNOSIS — B1920 Unspecified viral hepatitis C without hepatic coma: Secondary | ICD-10-CM | POA: Diagnosis not present

## 2015-10-08 DIAGNOSIS — D352 Benign neoplasm of pituitary gland: Secondary | ICD-10-CM | POA: Diagnosis not present

## 2015-10-08 DIAGNOSIS — K219 Gastro-esophageal reflux disease without esophagitis: Secondary | ICD-10-CM | POA: Insufficient documentation

## 2015-10-08 DIAGNOSIS — M545 Low back pain: Secondary | ICD-10-CM | POA: Insufficient documentation

## 2015-10-08 NOTE — Progress Notes (Signed)
Subjective:    [name], female    DOB: 03-05-1961, 55 y.o.   MRN: [id number]  HPI  55 year old female with history of HIV and chronic low back pain. She has had good results with L3 L4 L5 radiofrequency neurotomy last performed in 2015. She started having increasing low back pain Diagnosed with Addison's disease, Reviewed internal medicine residents notes. [name] [name] indicated multiple lumbar injections with steroid as cause for Addison's. Patient had following injections  07/26/2012 medial branch blocks 15 mg Depo-Medrol equivalent 12/20/2012 medial branch blocks 15 mg Depo-Medrol equivalent 07/18/2013 medial branch blocks 15 mg Depo-Medrol equivalent 10/17/2013 medial branch blocks 15 mg Depo-Medrol equivalent 12/12/2013 radiofrequency ablation 15 mg Depo-Medrol equivalent 01/21/2014 radiofrequency ablation 15 mg Depo-Medrol equivalent  A total of 90 mg Depo-Medrol equivalent over 18 month timeframe.This is quite low given the typical epidural injection uses 80 mg of Depo-Medrol in a single injection  Last CD4 count reported as 720 Pain Inventory Average Pain 5 Pain Right Now 10 My pain is sharp, dull and aching  In the last 24 hours, has pain interfered with the following? General activity 2 Relation with others 5 Enjoyment of life 2 What TIME of day is your pain at its worst? ALL Sleep (in general) NA  Pain is worse with: walking, bending, sitting and standing Pain improves with: injections Relief from Meds: NA  Mobility walk with assistance ability to climb steps?  no do you drive?  no  Function disabled: date disabled NA I need assistance with the following:  shopping  Neuro/Psych weakness trouble walking dizziness anxiety  Prior Studies Any changes since last visit?  yes  Physicians involved in your care Primary care [place] History  Problem Relation Age of Onset  . Heart disease Mother   . Diabetes Mother   . Stroke  Mother   . Heart disease Father   . Stroke Father   . Diabetes Father   . Hepatitis Sister     hcv  . Stroke Other   . Colon polyps Brother   . Cancer Sister     lung   Social History   Social History  . Marital Status: Single    Spouse Name: N/A  . Number of Children: 0  . Years of Education: N/A   Social History Main Topics  . Smoking status: Former Smoker    Types: Cigarettes    Quit date: 04/26/2007  . Smokeless tobacco: Never Used     Comment: QUIT 2009  . Alcohol Use: No  . Drug Use: No  . Sexual Activity: Not Asked     Comment: pt. given condoms   Other Topics Concern  . None   Social History Narrative   Alternate # [phone]   Past Surgical History  Procedure Laterality Date  . Colectomy      2003 for diverticulitis  . Hand surgery    . Bunionectomy      b/l  . Shoulder surgery      left  . Tonsillectomy    . Nasal sinus surgery     Past Medical History  Diagnosis Date  . HIV infection ([place])     1994  . Hepatitis C     genotype 1b, 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 s/p 12 week course of simeprevir and sofosbuvir between October 2014 and January 2015 with resolution.  . Hypertension   . GERD (gastroesophageal reflux disease)   . Depression   .  Chronic back pain   . Hyperlipidemia   . History of shingles   . Refusal of blood transfusions as patient is Jehovah's Witness   . Prediabetes   . Allergic rhinitis 05/09/2006  . Pituitary microadenoma ([place]) 08/08/2014  . Secondary adrenal insufficiency ([facility]) 06/29/2014   BP 138/91 mmHg  Pulse 75  Resp 14  SpO2 97%  Opioid Risk Score:   Fall Risk Score:  `1  Depression screen PHQ 2/9  Depression screen [facility] 2/9 10/08/2015 10/01/2015 07/31/2015 07/21/2015 07/09/2015 06/08/2015 05/07/2015  Decreased Interest 3 0 0 0 0 0 0  Down, Depressed, Hopeless 0 1 0 0 0 0 0  PHQ - 2 Score 3 1 0 0 0 0 0  Altered sleeping 3 - - - - - -  Tired, decreased energy 3 - - - - - -  Change in appetite 2 - - -  - - -  Feeling bad or failure about yourself  0 - - - - - -  Trouble concentrating 0 - - - - - -  Moving slowly or fidgety/restless 0 - - - - - -  Suicidal thoughts 0 - - - - - -  PHQ-9 Score 11 - - - - - -    5    Review of Systems  Constitutional: Positive for diaphoresis and unexpected weight change.  Respiratory: Positive for shortness of breath.        Respiratory infection   Cardiovascular:       Limb swelling   Gastrointestinal: Positive for nausea.  All other systems reviewed and are negative.      Objective:   Physical Exam  Constitutional: She is oriented to person, place, and time. She appears well-developed and well-nourished.  Truncal obesity Moon facies  HENT:  Head: Normocephalic and atraumatic.  Eyes: Conjunctivae and EOM are normal. Pupils are equal, round, and reactive to light.  Musculoskeletal:       Lumbar back: She exhibits decreased range of motion and tenderness. She exhibits no deformity.  Pain with lumbar extension greater than with flexion Lumbar flexion is 50% extension is 25% lateral bending is 25%. There is no evidence of scoliosis. Her pain is at the lumbosacral junction.  Negative straight leg raising  Neurological: She is alert and oriented to person, place, and time. She has normal strength. No sensory deficit.  Motor strength is 5/5 bilateral hip flexor knee extensor and ankle dorsal flexor Sensation intact to pinprick bilateral L2-L3 L4 L5 S1 dermatomal distribution  Psychiatric: She has a normal mood and affect.  Nursing note and vitals reviewed.         Assessment & Plan:  1. Lumbar spondylosis without myelopathy. She's had good and persistent relief with lumbar medial branch radiofrequency ablation. She has an interval medical history positive for Addison's disease. I reviewed injection notes as well as dosages. As above it is low dose steroid exposure intermittent. Total of 90 mg over 18 months. This would be less than a  typical Medrol Dosepak and approximately equivalent to 1 epidural injection.  Follow-up imaging MRI showed pituitary microadenoma. Currently seeing endocrinology. Notes not available  May repeat radiofrequency ablation using only local anesthetic. Would start with right L3 L4 L5 and one month later perform left side.

## 2015-10-08 NOTE — Patient Instructions (Signed)
I believe your increased low back pain is a combination of weight gain plus the radiofrequency ablation wearing off.

## 2015-10-14 ENCOUNTER — Other Ambulatory Visit: Payer: Self-pay | Admitting: Internal Medicine

## 2015-10-14 ENCOUNTER — Other Ambulatory Visit: Payer: Self-pay | Admitting: Nurse Practitioner

## 2015-10-22 LAB — HM DIABETES EYE EXAM

## 2015-11-05 ENCOUNTER — Other Ambulatory Visit: Payer: Self-pay | Admitting: Nurse Practitioner

## 2015-11-06 ENCOUNTER — Encounter: Payer: Self-pay | Admitting: Physical Medicine & Rehabilitation

## 2015-11-06 ENCOUNTER — Encounter: Payer: Medicaid Other | Attending: Physical Medicine & Rehabilitation

## 2015-11-06 ENCOUNTER — Ambulatory Visit (HOSPITAL_BASED_OUTPATIENT_CLINIC_OR_DEPARTMENT_OTHER): Payer: Medicaid Other | Admitting: Physical Medicine & Rehabilitation

## 2015-11-06 VITALS — BP 123/84 | HR 76 | Resp 14

## 2015-11-06 DIAGNOSIS — K219 Gastro-esophageal reflux disease without esophagitis: Secondary | ICD-10-CM | POA: Insufficient documentation

## 2015-11-06 DIAGNOSIS — M545 Low back pain: Secondary | ICD-10-CM | POA: Insufficient documentation

## 2015-11-06 DIAGNOSIS — B20 Human immunodeficiency virus [HIV] disease: Secondary | ICD-10-CM | POA: Diagnosis not present

## 2015-11-06 DIAGNOSIS — E785 Hyperlipidemia, unspecified: Secondary | ICD-10-CM | POA: Insufficient documentation

## 2015-11-06 DIAGNOSIS — Z87891 Personal history of nicotine dependence: Secondary | ICD-10-CM | POA: Insufficient documentation

## 2015-11-06 DIAGNOSIS — B1920 Unspecified viral hepatitis C without hepatic coma: Secondary | ICD-10-CM | POA: Insufficient documentation

## 2015-11-06 DIAGNOSIS — I10 Essential (primary) hypertension: Secondary | ICD-10-CM | POA: Insufficient documentation

## 2015-11-06 DIAGNOSIS — M47816 Spondylosis without myelopathy or radiculopathy, lumbar region: Secondary | ICD-10-CM | POA: Insufficient documentation

## 2015-11-06 DIAGNOSIS — D352 Benign neoplasm of pituitary gland: Secondary | ICD-10-CM | POA: Diagnosis not present

## 2015-11-06 DIAGNOSIS — G8929 Other chronic pain: Secondary | ICD-10-CM | POA: Diagnosis not present

## 2015-11-06 MED ORDER — GABAPENTIN 600 MG PO TABS: 600.0000 mg | ORAL_TABLET | Freq: Three times a day (TID) | ORAL | Status: DC

## 2015-11-06 NOTE — Progress Notes (Signed)
  [facility]   Name: [name] [name] DOB:07/24/1960 MRN: [id number]  Date:11/06/2015  Physician: Alysia Penna, MD    Nurse/CMA: Masoud Nyce, CMA  Allergies:  Allergies  Allergen Reactions  . Acetaminophen Other (See Comments)    Inflamed liver, hospitalized REACTION: Had liver problems, was hospitalized  . Dyazide [Hydrochlorothiazide W-Triamterene] Hives  . Lisinopril     Cough   . Triamterene Hives  . Morphine Hives and Rash    REACTION: rash    Consent Signed: Yes.    Is patient diabetic? Yes.    CBG today? Pre-diabetic  Pregnant: No. LMP: No LMP recorded. Patient is postmenopausal. (age 69-55)  Anticoagulants: no Anti-inflammatory: no Antibiotics: no  Procedure: right L3,4,5 radiofrequency neurotomy  Position: Prone Start Time: 10:39am  End Time: 10:53am  Fluoro Time: 28  RN/CMA Ran Tullis, CMA Destynee Stringfellow, CMA    Time 10:15am 11:00am    BP 123/84 130/84    Pulse 76 79    Respirations 14 14    O2 Sat 94 97    S/S 6 6    Pain Level 9/10 9/10     D/C home with Sister in law [name] [name], [name] & O X 3, D/C instructions reviewed, and sits independently.

## 2015-11-06 NOTE — Patient Instructions (Signed)
You had a radio frequency procedure today This was done to alleviate joint pain in your lumbar area We injected a combination of dexamethasone which is a steroid as well as lidocaine which is a local anesthetic. Dexamethasone made increased blood sugars you are diabetic You may experience soreness at the injection sites. You may also experienced some irritation of the nerves that were heated I'm recommending ice for 30 minutes every 2 hours as needed for the next 24-48 hours In addition he will be taking gabapentin 600 mg 3 times a day today only if this is not one of your usual medicines  

## 2015-11-06 NOTE — Progress Notes (Signed)
RightL5 dorsal ramus., Right L4 and Right L3 medial branch radio frequency neurotomy under fluoroscopic guidance   Indication: Low back pain due to lumbar spondylosis which has been relieved on 2 occasions by greater than 50% by lumbar medial branch blocks at corresponding levels.  Informed consent was obtained after describing risks and benefits of the procedure with the patient, this includes bleeding, bruising, infection, paralysis and medication side effects. The patient wishes to proceed and has given written consent. The patient was placed in a prone position. The lumbar and sacral area was marked and prepped with Betadine. A 25-gauge 1-1/2 inch needle was inserted into the skin and subcutaneous tissue at 3 sites in one ML of 1% lidocaine was injected into each site. Then a 20-gauge 10 cm radio frequency needle with a 1 cm curved active tip was inserted targeting the Right S1 SAP/sacral ala junction. Bone contact was made and confirmed with lateral imaging. Sensory stimulation at 50 Hz followed by motor stimulation at 2 Hz confirm proper needle location followed by injection of one ML of the solution containing one ML of 4 mg per mL dexamethasone and 3 mL of 1% MPF lidocaine. Then the Right L5 SAP/transverse process junction was targeted. Bone contact was made and confirmed with lateral imaging. Sensory stimulation at 50 Hz followed by motor stimulation at 2 Hz confirm proper needle location followed by injection of one ML of the solution containing one ML of 4 mg per mL dexamethasone and 3 mL of 1% MPF lidocaine. Then the Right L4 SAP/transverse process junction was targeted. Bone contact was made and confirmed with lateral imaging. Sensory stimulation at 50 Hz followed by motor stimulation at 2 Hz confirm proper needle location followed by injection of one ML of the solution containing one ML of 4 mg per mL dexamethasone and 3 mL of 1% MPF lidocaine. Radio frequency lesion being at [facility] for 90 seconds  was performed. Needles were removed. Post procedure instructions and vital signs were performed. Patient tolerated procedure well. Followup appointment was given.

## 2015-11-11 ENCOUNTER — Other Ambulatory Visit: Payer: Self-pay | Admitting: Nurse Practitioner

## 2015-11-11 ENCOUNTER — Encounter: Payer: Self-pay | Admitting: Gastroenterology

## 2015-11-11 ENCOUNTER — Encounter: Payer: Self-pay | Admitting: *Deleted

## 2015-11-12 ENCOUNTER — Other Ambulatory Visit: Payer: Self-pay | Admitting: Pulmonary Disease

## 2015-11-13 ENCOUNTER — Other Ambulatory Visit: Payer: Self-pay | Admitting: *Deleted

## 2015-11-13 ENCOUNTER — Other Ambulatory Visit: Payer: Self-pay | Admitting: Nurse Practitioner

## 2015-11-13 ENCOUNTER — Telehealth: Payer: Self-pay

## 2015-11-13 MED ORDER — DIAZEPAM 5 MG PO TABS
5.0000 mg | ORAL_TABLET | Freq: Every evening | ORAL | Status: DC | PRN
Start: 2015-11-13 — End: 2016-03-02

## 2015-11-13 MED ORDER — TRAMADOL HCL 50 MG PO TABS: 100.0000 mg | ORAL_TABLET | Freq: Two times a day (BID) | ORAL | Status: DC | PRN

## 2015-11-13 NOTE — Telephone Encounter (Signed)
Requesting amlodipine to be filled @ [facility].

## 2015-11-13 NOTE — Telephone Encounter (Signed)
Have sent request already this am

## 2015-11-16 ENCOUNTER — Other Ambulatory Visit: Payer: Self-pay | Admitting: *Deleted

## 2015-11-16 MED ORDER — AMLODIPINE BESYLATE 5 MG PO TABS: 5.0000 mg | ORAL_TABLET | Freq: Every day | ORAL | 3 refills | Status: DC

## 2015-11-17 ENCOUNTER — Encounter: Payer: Self-pay | Admitting: Gastroenterology

## 2015-11-17 ENCOUNTER — Telehealth: Payer: Self-pay | Admitting: Physical Medicine & Rehabilitation

## 2015-11-17 NOTE — Telephone Encounter (Signed)
Called to pharm 

## 2015-11-17 NOTE — Telephone Encounter (Signed)
[facility]oke with [facility] They wanted clarification on the one day supply of the Gabapentin. I informed them that it was correct, it was only to be taken the day of her procedure.

## 2015-11-17 NOTE — Telephone Encounter (Signed)
[name] with [facility] needs a call back about patients medication Gabapentin, qty given was 3.  This would only be a one day supply.  Her number is [phone].

## 2015-11-19 ENCOUNTER — Encounter: Payer: Medicaid Other | Admitting: Pulmonary Disease

## 2015-11-20 ENCOUNTER — Ambulatory Visit (INDEPENDENT_AMBULATORY_CARE_PROVIDER_SITE_OTHER): Payer: Medicaid Other | Admitting: Internal Medicine

## 2015-11-20 ENCOUNTER — Encounter: Payer: Self-pay | Admitting: Internal Medicine

## 2015-11-20 DIAGNOSIS — Z21 Asymptomatic human immunodeficiency virus [HIV] infection status: Secondary | ICD-10-CM | POA: Diagnosis not present

## 2015-11-20 DIAGNOSIS — R21 Rash and other nonspecific skin eruption: Secondary | ICD-10-CM

## 2015-11-20 MED ORDER — CLOTRIMAZOLE 1 % EX CREA: 1.0000 "application " | TOPICAL_CREAM | Freq: Two times a day (BID) | CUTANEOUS | 0 refills | Status: DC

## 2015-11-20 NOTE — Assessment & Plan Note (Signed)
Pt is here for rash on her face for about 2 weeks. She says she does not recall what started it. She says it is present bilaterally on both cheeks, but mainly around the marionette lines. She says if she is outside, it can get very red with some pruritus. She denies any new lotion, makeup, or insect bites. No travel. No one else is sick in house. She has tried using benadryl.  She says she has had history of lot of allergic reactions in the past.  She has Addisons, and sees Dr Buddy Duty and his hydrocortisone regimen was adjusted last week to 10 mg in AM, 5 mg at 11 AM, and 5 mg at 3 PM. However, the rash on her face preceded the change in med. She denies any joint pain or myalgias.  Her HIV is well controlled and last Cd4 count was > 500 with low VL.  On exam, there is just some scaling of skin present at the marionette lines. No erythema or facial flushing to suggest lupus. Oropharynx is clear.  She says it could be ringworm as she had it in past, but not on the face. She is willing to try clotrimazole for 10 days to see if it improves.  -Plan -clotrimazole 1% cream BID for 10 days at the affected area -follow up as needed -also advised to follow up with Dr Johnnye Sima

## 2015-11-20 NOTE — Patient Instructions (Signed)
Thank you for your visit today Please use the clotrimazole cream for 10 days. Please let us know if your symptoms do not improve  Please also use unscented lotion- like Aveeno eczema for your dry skin, which has colloidal oatmeal to soothe the skin

## 2015-11-20 NOTE — Progress Notes (Signed)
Internal Medicine Clinic Attending  I saw and evaluated the patient.  I personally confirmed the key portions of the history and exam documented by Dr. Saraiya and I reviewed pertinent patient test results.  The assessment, diagnosis, and plan were formulated together and I agree with the documentation in the resident's note.  

## 2015-11-20 NOTE — Progress Notes (Signed)
    CC: rash HPI: [name] [name] is a 55 y.o. woman with PMH noted below here for rash of 2 weeks on her face.  Please see Problem List/A&P for the status of the patient's chronic medical problems   Past Medical History:  Diagnosis Date  . Allergic rhinitis 05/09/2006  . Chronic back pain   . Depression   . GERD (gastroesophageal reflux disease)   . 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 s/p 12 week course of simeprevir and sofosbuvir between October 2014 and January 2015 with resolution.  Marland Kitchen History of shingles   . HIV infection ([place])    1994  . Hyperlipidemia   . Hypertension   . Pituitary microadenoma ([place]) 08/08/2014  . Prediabetes   . Refusal of blood transfusions as patient is Jehovah's Witness   . Secondary adrenal insufficiency ([place]) 06/29/2014    Review of Systems:  Constitutional: Negative for fever, chills,  HEENT: No headaches,  Rash on the face. No other rash on body Gastrointestinal:  Has chronic nausea due to steroid use Musculoskeletal: Negative for myalgias, joint pain, Skin: face rash  Physical Exam: Vitals:   11/20/15 1425  BP: (!) 144/77  Pulse: 78  Temp: 98.3 F (36.8 C)  TempSrc: Oral  SpO2: 100%  Weight: 174 lb 4.8 oz (79.1 kg)    General: A&O, in NAD CV: RRR, normal s1, s2, no m/r/g Resp: equal and symmetric breath sounds, no wheezing or crackles  Abdomen: soft, nontender, nondistended, +BS Skin:  Has some scaling around the marionette lines b/l- but no malar erythema or flushing. Some dry skin   Assessment & Plan:   See encounters tab for problem based medical decision making. Patient seen with Dr. Lynnae January

## 2015-11-23 ENCOUNTER — Other Ambulatory Visit: Payer: Self-pay | Admitting: *Deleted

## 2015-11-23 DIAGNOSIS — B20 Human immunodeficiency virus [HIV] disease: Secondary | ICD-10-CM

## 2015-11-24 ENCOUNTER — Other Ambulatory Visit: Payer: Medicaid Other

## 2015-11-24 DIAGNOSIS — Z79899 Other long term (current) drug therapy: Secondary | ICD-10-CM

## 2015-11-24 DIAGNOSIS — B20 Human immunodeficiency virus [HIV] disease: Secondary | ICD-10-CM

## 2015-11-24 LAB — CBC WITH DIFFERENTIAL/PLATELET
BASOS PCT: 0 %
Basophils Absolute: 0 cells/uL (ref 0–200)
EOS ABS: 168 {cells}/uL (ref 15–500)
Eosinophils Relative: 2 %
HEMATOCRIT: 40.4 % (ref 35.0–45.0)
HEMOGLOBIN: 12.9 g/dL (ref 11.7–15.5)
LYMPHS ABS: 2520 {cells}/uL (ref 850–3900)
Lymphocytes Relative: 30 %
MCH: 25.8 pg — ABNORMAL LOW (ref 27.0–33.0)
MCHC: 31.9 g/dL — ABNORMAL LOW (ref 32.0–36.0)
MCV: 80.8 fL (ref 80.0–100.0)
MONO ABS: 588 {cells}/uL (ref 200–950)
MPV: 11 fL (ref 7.5–12.5)
Monocytes Relative: 7 %
Neutro Abs: 5124 cells/uL (ref 1500–7800)
Neutrophils Relative %: 61 %
Platelets: 280 10*3/uL (ref 140–400)
RBC: 5 MIL/uL (ref 3.80–5.10)
RDW: 16.9 % — ABNORMAL HIGH (ref 11.0–15.0)
WBC: 8.4 10*3/uL (ref 3.8–10.8)

## 2015-11-25 LAB — COMPLETE METABOLIC PANEL WITH GFR
ALT: 11 U/L (ref 6–29)
AST: 15 U/L (ref 10–35)
Albumin: 4.1 g/dL (ref 3.6–5.1)
Alkaline Phosphatase: 97 U/L (ref 33–130)
BUN: 10 mg/dL (ref 7–25)
CALCIUM: 9.4 mg/dL (ref 8.6–10.4)
CHLORIDE: 105 mmol/L (ref 98–110)
CO2: 26 mmol/L (ref 20–31)
Creat: 0.76 mg/dL (ref 0.50–1.05)
GFR, Est Non African American: 89 mL/min (ref >=60)
Glucose, Bld: 107 mg/dL — ABNORMAL HIGH (ref 65–99)
POTASSIUM: 3.9 mmol/L (ref 3.5–5.3)
Sodium: 141 mmol/L (ref 135–146)
Total Bilirubin: 0.3 mg/dL (ref 0.2–1.2)
Total Protein: 7.6 g/dL (ref 6.1–8.1)

## 2015-11-25 LAB — LIPID PANEL
CHOL/HDL RATIO: 4.7 ratio (ref <=5.0)
CHOLESTEROL: 173 mg/dL (ref 125–200)
HDL: 37 mg/dL — AB (ref >=46)
LDL Cholesterol: 88 mg/dL (ref <130)
TRIGLYCERIDES: 239 mg/dL — AB (ref <150)
VLDL: 48 mg/dL — ABNORMAL HIGH (ref <30)

## 2015-11-25 LAB — T-HELPER CELL (CD4) - (RCID CLINIC ONLY)
CD4 T CELL ABS: 570 /uL (ref 400–2700)
CD4 T CELL HELPER: 22 % — AB (ref 33–55)

## 2015-11-26 LAB — HIV-1 RNA QUANT-NO REFLEX-BLD: HIV 1 RNA Quant: <20 copies/mL (ref <20)

## 2015-11-30 ENCOUNTER — Ambulatory Visit (AMBULATORY_SURGERY_CENTER): Payer: Self-pay | Admitting: *Deleted

## 2015-11-30 VITALS — Ht 61.0 in | Wt 174.8 lb

## 2015-11-30 DIAGNOSIS — Z1211 Encounter for screening for malignant neoplasm of colon: Secondary | ICD-10-CM

## 2015-11-30 MED ORDER — NA SULFATE-K SULFATE-MG SULF 17.5-3.13-1.6 GM/177ML PO SOLN: ORAL | 0 refills | Status: DC

## 2015-11-30 NOTE — Progress Notes (Signed)
No egg or soy allergy  No home oxygen  No diet medications  2 day prep instructions to pt  No anesthesia or intubation problems

## 2015-12-02 ENCOUNTER — Other Ambulatory Visit: Payer: Self-pay | Admitting: *Deleted

## 2015-12-02 ENCOUNTER — Encounter: Payer: Self-pay | Admitting: Infectious Diseases

## 2015-12-02 ENCOUNTER — Ambulatory Visit (INDEPENDENT_AMBULATORY_CARE_PROVIDER_SITE_OTHER): Payer: Medicaid Other | Admitting: Infectious Diseases

## 2015-12-02 ENCOUNTER — Telehealth: Payer: Self-pay | Admitting: *Deleted

## 2015-12-02 VITALS — BP 131/90 | HR 80 | Temp 98.1°F | Ht 61.0 in | Wt 175.5 lb

## 2015-12-02 DIAGNOSIS — M545 Low back pain, unspecified: Secondary | ICD-10-CM

## 2015-12-02 DIAGNOSIS — R21 Rash and other nonspecific skin eruption: Secondary | ICD-10-CM

## 2015-12-02 DIAGNOSIS — B20 Human immunodeficiency virus [HIV] disease: Secondary | ICD-10-CM

## 2015-12-02 DIAGNOSIS — Z113 Encounter for screening for infections with a predominantly sexual mode of transmission: Secondary | ICD-10-CM

## 2015-12-02 DIAGNOSIS — R7303 Prediabetes: Secondary | ICD-10-CM | POA: Diagnosis not present

## 2015-12-02 DIAGNOSIS — G8929 Other chronic pain: Secondary | ICD-10-CM

## 2015-12-02 DIAGNOSIS — Z79899 Other long term (current) drug therapy: Secondary | ICD-10-CM

## 2015-12-02 MED ORDER — EMTRICITABINE-TENOFOVIR AF 200-25 MG PO TABS: 1.0000 | ORAL_TABLET | Freq: Every day | ORAL | 3 refills | Status: DC

## 2015-12-02 NOTE — Assessment & Plan Note (Signed)
She is doing very well.  Will change her truvada to descovy.  Will get pap via PCP Flu shots available next week.  rtc in 6 months.

## 2015-12-02 NOTE — Assessment & Plan Note (Signed)
Appreciate PMR f/u

## 2015-12-02 NOTE — Addendum Note (Signed)
Addended by: HATCHER, JEFFREY C on: 12/02/2015 11:07 AM   Modules accepted: Orders

## 2015-12-02 NOTE — Telephone Encounter (Signed)
Informed the patient that [facility] PCP will be setting up Dermatology appointment.  Also, let the pt know that the appointment will be at [facility].  She was OK with this arrangement.

## 2015-12-02 NOTE — Progress Notes (Signed)
   Subjective:    [name], female    DOB: 1960-04-30, 55 y.o.   MRN: [id number]  HPI  55 yo F with hx of Hep C (tx at [facility] completed 04-2013, repeat Hep C RNA -) and HIV+ (prev LXVr/TRV). She had nasal septoplasty 01-05-10. She was previously changed to [place] in 2013. She was then changed to TRV/DTGV in anticipation of starting Hep C treatment.  She developed a rash on this and was then changed to ISN/TRV (which she takes with hydroxizine due to pruritis, rash).  She was changed to ATVc/TRV which her rash persisted on, then was taken off ART. Was seen December 2015 and restarted ART due to low CD4. Now on DRVc-TRV (prezcobix/truvada).  Was hospitalized in April 2016 and found to have pituitary adenoma (being watched), addison's disease, borderline DM. She has recently gained wt due to steroid burst (July). 40#. Now "prediabetic" A1C 6.2%.  Ha f/u with Dr Buddy Duty on 8-25.  States she fainted recently, was told her steroids were too low.  Seeing PMR for back pain injections. 8-22 Had facial rash, was given topical clotrimazole. Dry, itchy, burning. Unchanged, perhaps worse.  Colonoscopy 8-16   HIV 1 RNA Quant (copies/mL)  Date Value  11/24/2015 <20  03/09/2015 <20  01/12/2015 <20   CD4 T Cell Abs (/uL)  Date Value  11/24/2015 570  03/09/2015 880  01/12/2015 770    Review of Systems  Constitutional: Positive for unexpected weight change. Negative for appetite change.  Gastrointestinal: Negative for constipation and diarrhea.  Genitourinary: Negative for difficulty urinating.       Objective:   Physical Exam  Constitutional: She appears well-developed.  HENT:  Mouth/Throat: No oropharyngeal exudate.  Eyes: EOM are normal. Pupils are equal, round, and reactive to light.  Neck: Neck supple.  Cardiovascular: Normal rate, regular rhythm and normal heart sounds.   Pulmonary/Chest: Effort normal and breath sounds normal.  Abdominal: Soft. Bowel sounds are normal.  There is no tenderness. There is no rebound.  Musculoskeletal: She exhibits no edema.  Lymphadenopathy:    She has no cervical adenopathy.      Assessment & Plan:

## 2015-12-02 NOTE — Assessment & Plan Note (Signed)
Encouraged her to lose weight, watch diet.  She is unable to exercise.

## 2015-12-02 NOTE — Assessment & Plan Note (Signed)
Due to this rash being on her face, not responsive to oral steroids, will have her seen by derm.  Appreciate IM eval.

## 2015-12-03 ENCOUNTER — Other Ambulatory Visit: Payer: Self-pay | Admitting: Pulmonary Disease

## 2015-12-03 NOTE — Addendum Note (Signed)
Addended by: Burgess Estelle A on: 12/03/2015 10:01 AM   Modules accepted: Orders

## 2015-12-07 ENCOUNTER — Ambulatory Visit: Payer: Medicaid Other | Admitting: Physical Medicine & Rehabilitation

## 2015-12-09 ENCOUNTER — Ambulatory Visit (AMBULATORY_SURGERY_CENTER): Payer: Medicaid Other | Admitting: Gastroenterology

## 2015-12-09 ENCOUNTER — Other Ambulatory Visit: Payer: Self-pay | Admitting: Allergy and Immunology

## 2015-12-09 ENCOUNTER — Other Ambulatory Visit: Payer: Self-pay | Admitting: Pulmonary Disease

## 2015-12-09 ENCOUNTER — Other Ambulatory Visit: Payer: Self-pay | Admitting: Internal Medicine

## 2015-12-09 ENCOUNTER — Encounter: Payer: Self-pay | Admitting: Gastroenterology

## 2015-12-09 VITALS — BP 147/90 | HR 73 | Temp 97.5°F | Resp 19 | Ht 61.0 in | Wt 174.0 lb

## 2015-12-09 DIAGNOSIS — Z1211 Encounter for screening for malignant neoplasm of colon: Secondary | ICD-10-CM | POA: Diagnosis not present

## 2015-12-09 MED ORDER — SODIUM CHLORIDE 0.9 % IV SOLN: 500.0000 mL | INTRAVENOUS | Status: DC

## 2015-12-09 NOTE — Progress Notes (Signed)
Transferred to recovery awake alert VSS report to RN

## 2015-12-09 NOTE — Op Note (Signed)
[name]: [name] [name] Procedure Date: 12/09/2015 9:23 AM MRN: [id number] Endoscopist: Mauri Pole , MD Age: 55 Referring MD:  Date of Birth: 16-Nov-1960 Gender: Female Account #: [id number] Procedure:                Colonoscopy Indications:              Screening for colorectal malignant neoplasm, Last                            colonoscopy 10 years ago Medicines:                Monitored Anesthesia Care Procedure:                Pre-Anesthesia Assessment:                           - Prior to the procedure, a History and Physical                            was performed, and patient medications and                            allergies were reviewed. The patient's tolerance of                            previous anesthesia was also reviewed. The risks                            and benefits of the procedure and the sedation                            options and risks were discussed with the patient.                            All questions were answered, and informed consent                            was obtained. Prior Anticoagulants: The patient has                            taken no previous anticoagulant or antiplatelet                            agents. ASA Grade Assessment: II - A patient with                            mild systemic disease. After reviewing the risks                            and benefits, the patient was deemed in                            satisfactory condition to undergo the procedure.  After obtaining informed consent, the colonoscope                            was passed under direct vision. Throughout the                            procedure, the patient's blood pressure, pulse, and                            oxygen saturations were monitored continuously. The                            Model CF-HQ190L [id number]) scope was introduced                            through the anus and advanced to  the the terminal                            ileum, with identification of the appendiceal                            orifice and IC valve. The colonoscopy was performed                            without difficulty. The patient tolerated the                            procedure well. The quality of the bowel                            preparation was good. The terminal ileum, ileocecal                            valve, appendiceal orifice, and rectum were                            photographed. Scope In: 9:37:43 AM Scope Out: 9:47:24 AM Scope Withdrawal Time: 0 hours 6 minutes 53 seconds  Total Procedure Duration: 0 hours 9 minutes 41 seconds  Findings:                 The perianal and digital rectal examinations were                            normal.                           Scattered small-mouthed diverticula were found in                            the sigmoid colon.                           Non-bleeding internal hemorrhoids were found during  retroflexion. The hemorrhoids were small.                           The exam was otherwise without abnormality. Complications:            No immediate complications. Estimated Blood Loss:     Estimated blood loss: none. Impression:               - Diverticulosis in the sigmoid colon.                           - Non-bleeding internal hemorrhoids.                           - The examination was otherwise normal.                           - No specimens collected. Recommendation:           - Patient has a contact number available for                            emergencies. The signs and symptoms of potential                            delayed complications were discussed with the                            patient. Return to normal activities tomorrow.                            Written discharge instructions were provided to the                            patient.                           - Resume previous diet.                            - Continue present medications.                           - Repeat colonoscopy in 10 years for screening                            purposes.                           - Return to GI clinic PRN. Mauri Pole, MD 12/09/2015 9:54:14 AM This report has been signed electronically.

## 2015-12-09 NOTE — Patient Instructions (Signed)
YOU HAD AN ENDOSCOPIC PROCEDURE TODAY AT [facility]:   Refer to the procedure report that was given to you for any specific questions about what was found during the examination.  If the procedure report does not answer your questions, please call your gastroenterologist to clarify.  If you requested that your care partner not be given the details of your procedure findings, then the procedure report has been included in a sealed envelope for you to review at your convenience later.  YOU SHOULD EXPECT: Some feelings of bloating in the abdomen. Passage of more gas than usual.  Walking can help get rid of the air that was put into your GI tract during the procedure and reduce the bloating. If you had a lower endoscopy (such as a colonoscopy or flexible sigmoidoscopy) you may notice spotting of blood in your stool or on the toilet paper. If you underwent a bowel prep for your procedure, you may not have a normal bowel movement for a few days.  Please Note:  You might notice some irritation and congestion in your nose or some drainage.  This is from the oxygen used during your procedure.  There is no need for concern and it should clear up in a day or so.  SYMPTOMS TO REPORT IMMEDIATELY:   Following lower endoscopy (colonoscopy or flexible sigmoidoscopy):  Excessive amounts of blood in the stool  Significant tenderness or worsening of abdominal pains  Swelling of the abdomen that is new, acute  Fever of 100F or higher   Following upper endoscopy (EGD)  Vomiting of blood or coffee ground material  New chest pain or pain under the shoulder blades  Painful or persistently difficult swallowing  New shortness of breath  Fever of 100F or higher  Black, tarry-looking stools  For urgent or emergent issues, a gastroenterologist can be reached at any hour by calling [phone].   DIET:  We do recommend a small meal at first, but then you may proceed to your regular diet.  Drink  plenty of fluids but you should avoid alcoholic beverages for 24 hours.  ACTIVITY:  You should plan to take it easy for the rest of today and you should NOT DRIVE or use heavy machinery until tomorrow (because of the sedation medicines used during the test).    FOLLOW UP: Our staff will call the number listed on your records the next business day following your procedure to check on you and address any questions or concerns that you may have regarding the information given to you following your procedure. If we do not reach you, we will leave a message.  However, if you are feeling well and you are not experiencing any problems, there is no need to return our call.  We will assume that you have returned to your regular daily activities without incident.  If any biopsies were taken you will be contacted by phone or by letter within the next 1-3 weeks.  Please call us at [phone] if you have not heard about the biopsies in 3 weeks.    SIGNATURES/CONFIDENTIALITY: You and/or your care partner have signed paperwork which will be entered into your electronic medical record.  These signatures attest to the fact that that the information above on your After Visit Summary has been reviewed and is understood.  Full responsibility of the confidentiality of this discharge information lies with you and/or your care-partner.    Handouts were given to your care partner on hemorrhoids,  diverticulosis, and a high fiber diet with liberal fluid intake. You may resume your current medications today. Repeat screening colonoscopy in 10 years. Please call if any questions or concerns.

## 2015-12-09 NOTE — Progress Notes (Signed)
No problems noted in the recovery room. maw 

## 2015-12-10 ENCOUNTER — Telehealth: Payer: Self-pay | Admitting: *Deleted

## 2015-12-10 NOTE — Telephone Encounter (Signed)
  Follow up Call-  Call back number 12/09/2015  Post procedure Call Back phone  # [phone]  Permission to leave phone message Yes  Some recent data might be hidden     Patient questions:  Do you have a fever, pain , or abdominal swelling? No. Pain Score  0 *  Have you tolerated food without any problems? Yes.    Have you been able to return to your normal activities? Yes.    Do you have any questions about your discharge instructions: Diet   No. Medications  No. Follow up visit  No.  Do you have questions or concerns about your Care? No.  Actions: * If pain score is 4 or above: No action needed, pain <4.pt did admit she was having nasal congestion ,almost like a head cold but no fever .

## 2015-12-11 ENCOUNTER — Ambulatory Visit: Payer: Medicaid Other

## 2015-12-14 ENCOUNTER — Encounter: Payer: Medicaid Other | Admitting: Gastroenterology

## 2015-12-15 ENCOUNTER — Encounter: Payer: Self-pay | Admitting: Physical Medicine & Rehabilitation

## 2015-12-15 ENCOUNTER — Ambulatory Visit (HOSPITAL_BASED_OUTPATIENT_CLINIC_OR_DEPARTMENT_OTHER): Payer: Medicaid Other | Admitting: Physical Medicine & Rehabilitation

## 2015-12-15 ENCOUNTER — Encounter: Payer: Medicaid Other | Attending: Physical Medicine & Rehabilitation

## 2015-12-15 VITALS — BP 141/88 | HR 76 | Resp 17

## 2015-12-15 DIAGNOSIS — I10 Essential (primary) hypertension: Secondary | ICD-10-CM | POA: Diagnosis not present

## 2015-12-15 DIAGNOSIS — K219 Gastro-esophageal reflux disease without esophagitis: Secondary | ICD-10-CM | POA: Insufficient documentation

## 2015-12-15 DIAGNOSIS — M545 Low back pain, unspecified: Secondary | ICD-10-CM

## 2015-12-15 DIAGNOSIS — D352 Benign neoplasm of pituitary gland: Secondary | ICD-10-CM | POA: Insufficient documentation

## 2015-12-15 DIAGNOSIS — B1920 Unspecified viral hepatitis C without hepatic coma: Secondary | ICD-10-CM | POA: Insufficient documentation

## 2015-12-15 DIAGNOSIS — B20 Human immunodeficiency virus [HIV] disease: Secondary | ICD-10-CM | POA: Insufficient documentation

## 2015-12-15 DIAGNOSIS — Z87891 Personal history of nicotine dependence: Secondary | ICD-10-CM | POA: Diagnosis not present

## 2015-12-15 DIAGNOSIS — M47816 Spondylosis without myelopathy or radiculopathy, lumbar region: Secondary | ICD-10-CM | POA: Insufficient documentation

## 2015-12-15 DIAGNOSIS — G8929 Other chronic pain: Secondary | ICD-10-CM | POA: Insufficient documentation

## 2015-12-15 DIAGNOSIS — E785 Hyperlipidemia, unspecified: Secondary | ICD-10-CM | POA: Diagnosis not present

## 2015-12-15 NOTE — Patient Instructions (Signed)
We will do blocks of the same nerves that we burned to see if you get at least some temporary relief. That would tell me that we are still dealing with arthritis. Weight loss would be helpful. Usually 1 pound a week is the best at preventing going backwards on that.

## 2015-12-15 NOTE — Progress Notes (Signed)
Subjective:    [name], female    DOB: 1961-04-05, 55 y.o.   MRN: [id number]  HPI  On 11/06/2015 patient underwent right L3 and right L 4 medial branch radiofrequency neurotomy as well as right L5 ramus radiofrequency neurotomy. Patient does not note any significant improvement in right-sided low back pain. Her prior radiofrequency neurotomies were in 2015 and resulted in a good, 50% reduction in back pain. Patient feels like she has some pain that radiates down the posterior thighs bilaterally. This is started over the last 6 months. This is not a daily occurrence. This usually happens when her back pain becomes more intense  Other interval medical history she's had a facial rash and has a follow ointment with dermatology. She is also seeing her endocrinologist and will be seen her infectious disease specialist, has been started on a new HIV medication.  Still takes Tramadol twice a day as needed Does not tolerate gabapentin, makes her feel funny  Patient notes significant weight gain Pain Inventory Average Pain 6 Pain Right Now 9 My pain is sharp, dull and aching  In the last 24 hours, has pain interfered with the following? General activity 8 Relation with others 5 Enjoyment of life 10 What TIME of day is your pain at its worst? morning, daytime, evening, night Sleep (in general) NA  Pain is worse with: walking, bending, sitting, inactivity and standing Pain improves with: rest, heat/ice and injections Relief from Meds: NA  Mobility walk without assistance how many minutes can you walk? 10 ability to climb steps?  yes do you drive?  no needs help with transfers Do you have any goals in this area?  yes  Function disabled: date disabled NA I need assistance with the following:  household duties and shopping Do you have any goals in this area?  yes  Neuro/Psych weakness trouble walking dizziness anxiety  Prior Studies Any changes since last visit?   yes  Physicians involved in your care Any changes since last visit?  yes Primary care .   Family History  Problem Relation Age of Onset  . Heart disease Mother   . Diabetes Mother   . Stroke Mother   . Heart disease Father   . Stroke Father   . Diabetes Father   . Hepatitis Sister     hcv  . Stroke Other   . Colon polyps Brother   . Cancer Sister     lung  . Colon cancer Neg Hx   . Esophageal cancer Neg Hx   . Stomach cancer Neg Hx   . Rectal cancer Neg Hx    Social History   Social History  . Marital status: Single    Spouse name: N/A  . Number of children: 0  . Years of education: N/A   Social History Main Topics  . Smoking status: Former Smoker    Years: 20.00    Types: Cigarettes    Quit date: 04/26/2007  . Smokeless tobacco: Never Used     Comment: QUIT 2009  . Alcohol use No  . Drug use: No  . Sexual activity: Not Asked     Comment: pt. given condoms   Other Topics Concern  . None   Social History Narrative   Alternate # [phone]   Past Surgical History:  Procedure Laterality Date  . BUNIONECTOMY     b/l  . COLECTOMY     2003 for diverticulitis, had colostomy bag and then reversed  .  COLONOSCOPY    . HAND SURGERY    . NASAL SINUS SURGERY    . SHOULDER SURGERY     left  . TONSILLECTOMY     Past Medical History:  Diagnosis Date  . Allergic rhinitis 05/09/2006  . Allergy   . Anxiety   . Arthritis   . Asthma   . CHF (congestive heart failure) ([place])   . Chronic back pain   . GERD (gastroesophageal reflux disease)   . Heart murmur    as a child  . 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 s/p 12 week course of simeprevir and sofosbuvir between October 2014 and January 2015 with resolution.  Marland Kitchen History of shingles   . HIV infection ([place])    1994  . Hyperlipidemia    no meds taken now  . Hypertension   . Pituitary microadenoma ([place]) 08/08/2014  . Prediabetes   . Refusal of blood transfusions as  patient is Jehovah's Witness   . Secondary adrenal insufficiency ([place]) 06/29/2014   There were no vitals taken for this visit.  Opioid Risk Score:   Fall Risk Score:  `1  Depression screen PHQ 2/9  Depression screen [facility] 2/9 12/02/2015 11/20/2015 10/08/2015 10/01/2015 07/31/2015 07/21/2015 07/09/2015  Decreased Interest 0 0 3 0 0 0 0  Down, Depressed, Hopeless 0 0 0 1 0 0 0  PHQ - 2 Score 0 0 3 1 0 0 0  Altered sleeping - - 3 - - - -  Tired, decreased energy - - 3 - - - -  Change in appetite - - 2 - - - -  Feeling bad or failure about yourself  - - 0 - - - -  Trouble concentrating - - 0 - - - -  Moving slowly or fidgety/restless - - 0 - - - -  Suicidal thoughts - - 0 - - - -  PHQ-9 Score - - 11 - - - -  Some recent data might be hidden    Review of Systems  Constitutional: Positive for appetite change, diaphoresis and unexpected weight change.  Gastrointestinal: Positive for diarrhea and nausea.  Genitourinary: Positive for difficulty urinating.  Musculoskeletal: Positive for gait problem.  Skin: Positive for rash.  Neurological: Positive for dizziness and weakness.  Psychiatric/Behavioral: The patient is nervous/anxious.   All other systems reviewed and are negative.      Objective:   Physical Exam  Constitutional: She is oriented to person, place, and time. She appears well-developed and well-nourished.  HENT:  Head: Normocephalic and atraumatic.  Eyes: Conjunctivae and EOM are normal. Pupils are equal, round, and reactive to light.  Neck: Normal range of motion.  Neurological: She is alert and oriented to person, place, and time.  Psychiatric: She has a normal mood and affect.  Nursing note and vitals reviewed. Tenderness to palpation in the lumbar area, right side greater than the left side. She has pain with lumbar flexion, extension, lateral bending and rotation. Lumbar range of motion is approximately 50%. She has no tenderness over the trochanteric area. No pain with hip  range of motion.       Assessment & Plan:  1. Chronic lumbar pain. She has had good results with L3, L4 medial branch RFA as well as L5 dorsal ramus RFA. The procedures in 2015, gave her prolonged relief, greater than 1 year. Repeat procedure on the right side performed last month, gave her only short-term relief, 2 weeks.  We discussed at length the possibilities. We discussed how radiofrequency neurotomy requires placement of the needle in very close proximity to the nerve There is a medial branch block performed at the same time as the radiofrequency ablation, which may have given her the short-term relief. We discussed to resolve the issue. We would repeat medial branch blocks on the right side, if she has positive short-term response, I would indicate that the radiofrequency ablation did not adequately ablate the medial branches. If she gets no relief with repeat medial branch blocks. She may have other pain generators involved at the current time, such as discs and would car additional imaging studies. Over half of the 25 min visit was spent counseling and coordinating care.

## 2015-12-22 ENCOUNTER — Encounter: Payer: Self-pay | Admitting: *Deleted

## 2015-12-31 ENCOUNTER — Encounter: Payer: Medicaid Other | Admitting: Pulmonary Disease

## 2016-01-01 ENCOUNTER — Other Ambulatory Visit: Payer: Self-pay | Admitting: Allergy and Immunology

## 2016-01-05 ENCOUNTER — Encounter: Payer: Medicaid Other | Attending: Physical Medicine & Rehabilitation

## 2016-01-05 ENCOUNTER — Encounter: Payer: Self-pay | Admitting: Physical Medicine & Rehabilitation

## 2016-01-05 ENCOUNTER — Ambulatory Visit (HOSPITAL_BASED_OUTPATIENT_CLINIC_OR_DEPARTMENT_OTHER): Payer: Medicaid Other | Admitting: Physical Medicine & Rehabilitation

## 2016-01-05 ENCOUNTER — Other Ambulatory Visit: Payer: Self-pay | Admitting: Pulmonary Disease

## 2016-01-05 ENCOUNTER — Other Ambulatory Visit: Payer: Self-pay | Admitting: Internal Medicine

## 2016-01-05 VITALS — BP 133/84 | HR 79 | Resp 17

## 2016-01-05 DIAGNOSIS — E785 Hyperlipidemia, unspecified: Secondary | ICD-10-CM | POA: Insufficient documentation

## 2016-01-05 DIAGNOSIS — Z87891 Personal history of nicotine dependence: Secondary | ICD-10-CM | POA: Insufficient documentation

## 2016-01-05 DIAGNOSIS — M545 Low back pain: Secondary | ICD-10-CM | POA: Insufficient documentation

## 2016-01-05 DIAGNOSIS — K219 Gastro-esophageal reflux disease without esophagitis: Secondary | ICD-10-CM | POA: Diagnosis not present

## 2016-01-05 DIAGNOSIS — M47816 Spondylosis without myelopathy or radiculopathy, lumbar region: Secondary | ICD-10-CM | POA: Insufficient documentation

## 2016-01-05 DIAGNOSIS — G8929 Other chronic pain: Secondary | ICD-10-CM | POA: Insufficient documentation

## 2016-01-05 DIAGNOSIS — I10 Essential (primary) hypertension: Secondary | ICD-10-CM | POA: Diagnosis not present

## 2016-01-05 DIAGNOSIS — B20 Human immunodeficiency virus [HIV] disease: Secondary | ICD-10-CM | POA: Diagnosis not present

## 2016-01-05 DIAGNOSIS — D352 Benign neoplasm of pituitary gland: Secondary | ICD-10-CM | POA: Diagnosis not present

## 2016-01-05 DIAGNOSIS — B1920 Unspecified viral hepatitis C without hepatic coma: Secondary | ICD-10-CM | POA: Insufficient documentation

## 2016-01-05 NOTE — Progress Notes (Signed)
  PROCEDURE RECORD [facility]   Name: [name] [name] DOB:[birth year] MRN: [id number]  Date:01/05/2016  Physician: Alysia Penna, MD    Nurse/CMA: Gara Kroner, CMA  Allergies:  Allergies  Allergen Reactions  . Acetaminophen Other (See Comments)    Inflamed liver, hospitalized REACTION: Had liver problems, was hospitalized  . Dyazide [Hydrochlorothiazide W-Triamterene] Hives  . Lisinopril     Cough   . Triamterene Hives  . Morphine Hives and Rash    REACTION: rash    Consent Signed: Yes.    Is patient diabetic? No.  CBG today? NA  Pregnant: No. LMP: No LMP recorded. Patient is postmenopausal. (age 39-55)  Anticoagulants: no Anti-inflammatory: no Antibiotics: no  Procedure: Medial Branch Block  Position: Prone Start Time: 1429 End Time: 1436 Fluoro Time: 22  RN/CMA Wood, CMA Wood, CMA    Time 1419 1441    BP 133/84 123/83    Pulse 85 77    Respirations 17 17    O2 Sat 95 95    S/S 6 6    Pain Level 10/10 8/10     D/C home with sister, [name] & O X 3, D/C instructions reviewed, and sits independently.

## 2016-01-05 NOTE — Patient Instructions (Signed)
Lumbar medial branch blocks were performed. This is to help diagnose the cause of the low back pain. It is important that you keep track of your pain for the first day or 2 after injection. This injection can give you temporary relief that lasts for hours or up to several months. There is no way to predict duration of pain relief.  Please try to compare your pain after injection to for the injection.   

## 2016-01-07 ENCOUNTER — Encounter: Payer: Self-pay | Admitting: Pulmonary Disease

## 2016-01-07 ENCOUNTER — Ambulatory Visit (INDEPENDENT_AMBULATORY_CARE_PROVIDER_SITE_OTHER): Payer: Medicaid Other | Admitting: Pulmonary Disease

## 2016-01-07 VITALS — BP 137/74 | HR 75 | Temp 98.7°F | Resp 20 | Wt 179.4 lb

## 2016-01-07 DIAGNOSIS — R21 Rash and other nonspecific skin eruption: Secondary | ICD-10-CM

## 2016-01-07 DIAGNOSIS — Z0000 Encounter for general adult medical examination without abnormal findings: Secondary | ICD-10-CM

## 2016-01-07 DIAGNOSIS — G8929 Other chronic pain: Secondary | ICD-10-CM | POA: Diagnosis not present

## 2016-01-07 DIAGNOSIS — M545 Low back pain, unspecified: Secondary | ICD-10-CM

## 2016-01-07 DIAGNOSIS — Z9622 Myringotomy tube(s) status: Secondary | ICD-10-CM | POA: Diagnosis not present

## 2016-01-07 DIAGNOSIS — Z21 Asymptomatic human immunodeficiency virus [HIV] infection status: Secondary | ICD-10-CM

## 2016-01-07 DIAGNOSIS — Z23 Encounter for immunization: Secondary | ICD-10-CM | POA: Diagnosis not present

## 2016-01-07 NOTE — Progress Notes (Signed)
   CC: rash follow up  HPI:  [name] is a 55 year old woman with history of HIV, treated hepatitis C, liver fibrosis, hypertension, GERD, adrenal insufficiency, prediabetes, chronic back pain presenting follow up of rash.  Her rash improved greatly when her HIV medications were changed. She reports a few dry patches still.   She did not have much improvement after injection recently. She is working with Dr. Letta Pate.   Vertigo has improved. She still has some vertigo.   She has a lot of popping sensation in her ear. She has not seen Dr. Erik Obey in a while. She plans on making an appointment to see him.   She uses her albuterol inhaler about once a day. She has an Horticulturist, commercial.  Past Medical History:  Diagnosis Date  . Allergic rhinitis 05/09/2006  . Allergy   . Anxiety   . Arthritis   . Asthma   . CHF (congestive heart failure) ([place])   . Chronic back pain   . GERD (gastroesophageal reflux disease)   . Heart murmur    as a child  . 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 s/p 12 week course of simeprevir and sofosbuvir between October 2014 and January 2015 with resolution.  Marland Kitchen History of shingles   . HIV infection ([place])    1994  . Hyperlipidemia    no meds taken now  . Hypertension   . Pituitary microadenoma ([place]) 08/08/2014  . Prediabetes   . Refusal of blood transfusions as patient is Jehovah's Witness   . Secondary adrenal insufficiency ([facility]) 06/29/2014    Review of Systems:   Constitutional: no fevers/chills Respiratory: no shortness of breath Gastrointestinal: no vomiting  Physical Exam:  Vitals:   01/07/16 1402  BP: 137/74  Pulse: 75  Resp: 20  Temp: 98.7 F (37.1 C)  TempSrc: Oral  SpO2: 98%  Weight: 81.4 kg (179 lb 6.4 oz)   General Apperance: NAD HEENT: Normocephalic, atraumatic, anicteric sclera Neck: Supple, trachea midline Lungs: Clear to auscultation bilaterally. No wheezes, rhonchi or rales. Breathing  comfortably Heart: Regular rate and rhythm, no murmur/rub/gallop Abdomen: Soft, nontender, nondistended, no rebound/guarding Extremities: Warm and well perfused, no edema Skin: No rashes or lesions Neurologic: Alert and interactive. No gross deficits.   Assessment & Plan:   See Encounters Tab for problem based charting.  Patient discussed with Dr. Dareen Piano

## 2016-01-07 NOTE — Patient Instructions (Signed)
Please keep taking your medications as prescribed. Follow up with your ENT doctor. Follow up here in 4-6 months, or sooner as needed.

## 2016-01-09 NOTE — Assessment & Plan Note (Signed)
Encouraged patient to follow up with Dr. Erik Obey.

## 2016-01-09 NOTE — Assessment & Plan Note (Signed)
Flu vaccine administered

## 2016-01-09 NOTE — Assessment & Plan Note (Addendum)
Assessment: Followed by PMR. She takes 1-2 tablets of tramadol a day. At most she takes 3 tablets. Her goal is to be able to exercise more. Her average pain is 6 and presently is at a 6. Her pain does not interfere with her general activity, relation with others. She has mild impairment of enjoyment of life as she does not go out as much as she would like. Her pain is worst in the morning. She has difficulty with sleep. She does not use any devices for mobility. She requires assistance from others for shopping, driving/going to appointments. She had injections recently and notes little to no improvement in her symptoms.   Plan: Continue follow up with PMR as scheduled. Continue tramadol 1-2 tablets q12hr prn

## 2016-01-09 NOTE — Assessment & Plan Note (Signed)
Resolving after her HIV med was changed. Continue to monitor for full resolution.

## 2016-01-11 NOTE — Progress Notes (Signed)
Internal Medicine Clinic Attending  Case discussed with Dr. Krall at the time of the visit.  We reviewed the resident's history and exam and pertinent patient test results.  I agree with the assessment, diagnosis, and plan of care documented in the resident's note.  

## 2016-01-18 NOTE — Addendum Note (Signed)
Addended by: Jacques Earthly T on: 01/18/2016 01:15 PM   Modules accepted: Orders

## 2016-02-02 ENCOUNTER — Encounter: Payer: Self-pay | Admitting: Physical Medicine & Rehabilitation

## 2016-02-02 ENCOUNTER — Encounter: Payer: Medicaid Other | Attending: Physical Medicine & Rehabilitation

## 2016-02-02 ENCOUNTER — Ambulatory Visit (HOSPITAL_BASED_OUTPATIENT_CLINIC_OR_DEPARTMENT_OTHER): Payer: Medicaid Other | Admitting: Physical Medicine & Rehabilitation

## 2016-02-02 VITALS — BP 125/82 | HR 82 | Resp 14

## 2016-02-02 DIAGNOSIS — B20 Human immunodeficiency virus [HIV] disease: Secondary | ICD-10-CM | POA: Insufficient documentation

## 2016-02-02 DIAGNOSIS — Z87891 Personal history of nicotine dependence: Secondary | ICD-10-CM | POA: Insufficient documentation

## 2016-02-02 DIAGNOSIS — B1920 Unspecified viral hepatitis C without hepatic coma: Secondary | ICD-10-CM | POA: Insufficient documentation

## 2016-02-02 DIAGNOSIS — D352 Benign neoplasm of pituitary gland: Secondary | ICD-10-CM | POA: Diagnosis not present

## 2016-02-02 DIAGNOSIS — K219 Gastro-esophageal reflux disease without esophagitis: Secondary | ICD-10-CM | POA: Insufficient documentation

## 2016-02-02 DIAGNOSIS — G8929 Other chronic pain: Secondary | ICD-10-CM | POA: Insufficient documentation

## 2016-02-02 DIAGNOSIS — M545 Low back pain: Secondary | ICD-10-CM | POA: Insufficient documentation

## 2016-02-02 DIAGNOSIS — M47816 Spondylosis without myelopathy or radiculopathy, lumbar region: Secondary | ICD-10-CM | POA: Insufficient documentation

## 2016-02-02 DIAGNOSIS — E785 Hyperlipidemia, unspecified: Secondary | ICD-10-CM | POA: Diagnosis not present

## 2016-02-02 DIAGNOSIS — I10 Essential (primary) hypertension: Secondary | ICD-10-CM | POA: Diagnosis not present

## 2016-02-02 MED ORDER — TRAMADOL HCL 50 MG PO TABS: 100.0000 mg | ORAL_TABLET | Freq: Two times a day (BID) | ORAL | 2 refills | Status: DC | PRN

## 2016-02-02 NOTE — Patient Instructions (Signed)
Cont weight loss efforts Repeat RFA in January

## 2016-02-02 NOTE — Progress Notes (Signed)
Subjective:    [name], female    DOB: 1960-04-27, 55 y.o.   MRN: [id number]  HPI 55 year old female with history of HIV with nondetectable viral loads and chronic low back pain. She had bilateral L3, L4, L5 radiofrequency neurotomy performed in 2015. She had approximately 18 month improvement after the last set performed on 01/21/2014. She had repeat procedure performed 11/06/2015, which unfortunately did not provide much relief. A L3, L4 medial branch, L5 dorsal ramus injection performed 01/05/2016 resulted in greater than 50% improvement. 4. 2 weeks. Pain Inventory Average Pain 6 Pain Right Now 8 My pain is sharp, dull, stabbing and aching  In the last 24 hours, has pain interfered with the following? General activity 8 Relation with others 5 Enjoyment of life 7 What TIME of day is your pain at its worst? Morning, night Sleep (in general) Poor  Pain is worse with: walking, bending, sitting and standing Pain improves with: heat/ice and injections Relief from Meds: 7  Mobility walk without assistance how many minutes can you walk? 15-20 minutes ability to climb steps?  no do you drive?  no Do you have any goals in this area?  no  Function disabled: date disabled 49 I need assistance with the following:  household duties  Neuro/Psych No problems in this area weakness numbness trouble walking spasms dizziness anxiety  Prior Studies Any changes since last visit?  no  Physicians involved in your care Any changes since last visit?  no   Family History  Problem Relation Age of Onset  . Heart disease Mother   . Diabetes Mother   . Stroke Mother   . Heart disease Father   . Stroke Father   . Diabetes Father   . Hepatitis Sister     hcv  . Stroke Other   . Colon polyps Brother   . Cancer Sister     lung  . Colon cancer Neg Hx   . Esophageal cancer Neg Hx   . Stomach cancer Neg Hx   . Rectal cancer Neg Hx    Social History   Social  History  . Marital status: Single    Spouse name: N/A  . Number of children: 0  . Years of education: N/A   Social History Main Topics  . Smoking status: Former Smoker    Years: 20.00    Types: Cigarettes    Quit date: 04/26/2007  . Smokeless tobacco: Never Used     Comment: QUIT 2009  . Alcohol use No  . Drug use: No  . Sexual activity: Not Asked     Comment: pt. given condoms   Other Topics Concern  . None   Social History Narrative   Alternate # [id number]   Past Surgical History:  Procedure Laterality Date  . BUNIONECTOMY     b/l  . COLECTOMY     2003 for diverticulitis, had colostomy bag and then reversed  . COLONOSCOPY    . HAND SURGERY    . NASAL SINUS SURGERY    . SHOULDER SURGERY     left  . TONSILLECTOMY     Past Medical History:  Diagnosis Date  . Allergic rhinitis 05/09/2006  . Allergy   . Anxiety   . Arthritis   . Asthma   . CHF (congestive heart failure) ([place])   . Chronic back pain   . GERD (gastroesophageal reflux disease)   . Heart murmur    as a child  . Hepatitis  C    genotype 1b, 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 s/p 12 week course of simeprevir and sofosbuvir between October 2014 and January 2015 with resolution.  Marland Kitchen History of shingles   . HIV infection ([place])    1994  . Hyperlipidemia    no meds taken now  . Hypertension   . Pituitary microadenoma ([place]) 08/08/2014  . Prediabetes   . Refusal of blood transfusions as patient is Jehovah's Witness   . Secondary adrenal insufficiency ([facility]) 06/29/2014   BP 125/82   Pulse 82   Resp 14   SpO2 97%   Opioid Risk Score:   Fall Risk Score:  `1  Depression screen PHQ 2/9  Depression screen [facility] 2/9 01/07/2016 12/02/2015 11/20/2015 10/08/2015 10/01/2015 07/31/2015 07/21/2015  Decreased Interest 0 0 0 3 0 0 0  Down, Depressed, Hopeless 0 0 0 0 1 0 0  PHQ - 2 Score 0 0 0 3 1 0 0  Altered sleeping - - - 3 - - -  Tired, decreased energy - - - 3 - - -  Change in appetite - - - 2 - - -    Feeling bad or failure about yourself  - - - 0 - - -  Trouble concentrating - - - 0 - - -  Moving slowly or fidgety/restless - - - 0 - - -  Suicidal thoughts - - - 0 - - -  PHQ-9 Score - - - 11 - - -  Some recent data might be hidden    Review of Systems  Constitutional: Positive for chills and unexpected weight change.       Night sweats  Respiratory: Positive for shortness of breath.   Gastrointestinal: Positive for nausea.  Musculoskeletal: Positive for joint swelling.       Limb swelling       Objective:   Physical Exam  Constitutional: She is oriented to person, place, and time. She appears well-developed and well-nourished.  HENT:  Head: Normocephalic and atraumatic.  Eyes: Conjunctivae and EOM are normal. Pupils are equal, round, and reactive to light.  Neck: Normal range of motion.  Neurological: She is alert and oriented to person, place, and time.  Skin: Skin is warm and dry.  Psychiatric: She has a normal mood and affect.  Nursing note and vitals reviewed.  Patient has full lumbar flexion. Her percent, lumbar extension 50%, accompanied by end range pain. She has some pain with right lateral bending greater than left lateral bending. She has mild tenderness to palpation around the elbow for L5 paraspinal areas on the right side only. Negative straight leg raising       Assessment & Plan:  1. Lumbar spondylosis without myelopathy. Has had good results with lumbar medial branch blocks, but last set of review frequency neurotomy was not helpful. We discussed that still appears to be facet mediated pain and neck step would be to repeat radiofrequency neurotomy in January, which would be 6 months after the last procedure. Would recommend using an 18-gauge rather than 20-gauge needle to allow a larger lesion at each level. Discussed with patient agrees with plan. We'll continue tramadol until that time

## 2016-02-04 ENCOUNTER — Other Ambulatory Visit: Payer: Self-pay | Admitting: Allergy and Immunology

## 2016-02-05 ENCOUNTER — Other Ambulatory Visit: Payer: Self-pay | Admitting: *Deleted

## 2016-02-06 MED ORDER — VITAMIN D (CHOLECALCIFEROL) 25 MCG (1000 UT) PO CAPS: 1000.0000 mg | ORAL_CAPSULE | Freq: Every day | ORAL | 1 refills | Status: DC

## 2016-02-25 ENCOUNTER — Encounter: Payer: Self-pay | Admitting: Internal Medicine

## 2016-02-25 ENCOUNTER — Ambulatory Visit (INDEPENDENT_AMBULATORY_CARE_PROVIDER_SITE_OTHER): Payer: Medicaid Other | Admitting: Internal Medicine

## 2016-02-25 DIAGNOSIS — Z87891 Personal history of nicotine dependence: Secondary | ICD-10-CM | POA: Diagnosis not present

## 2016-02-25 DIAGNOSIS — J069 Acute upper respiratory infection, unspecified: Secondary | ICD-10-CM | POA: Diagnosis not present

## 2016-02-25 MED ORDER — SALINE SPRAY 0.65 % NA SOLN: 1.0000 | NASAL | 0 refills | Status: DC | PRN

## 2016-02-25 MED ORDER — PSEUDOEPHEDRINE HCL 30 MG PO TABS: 30.0000 mg | ORAL_TABLET | Freq: Four times a day (QID) | ORAL | 0 refills | Status: DC | PRN

## 2016-02-25 MED ORDER — GUAIFENESIN-CODEINE 100-10 MG/5ML PO SOLN: 10.0000 mL | Freq: Three times a day (TID) | ORAL | 0 refills | Status: DC | PRN

## 2016-02-25 NOTE — Patient Instructions (Addendum)
General Instructions: - You most likely have a viral infection so we will work on treating your symptoms - Try using a saline nasal spray as needed for congestion - Also use Sudafed 30 mg every 6 hours as needed for congestion - Use Robitussin up to 3 times daily to help with your cough - Use ibuprofen as needed for body aches - Take hot showers and get plenty of sleep - Make sure to stay hydrated and eat soup/bland foods that you can tolerate (crackers, bananas, toast) - If you do not feel better in 1 week please return to clinic  Please bring your medicines with you each time you come to clinic.  Medicines may include prescription medications, over-the-counter medications, herbal remedies, eye drops, vitamins, or other pills.   Progress Toward Treatment Goals:  Treatment Goal 04/02/2015  Blood pressure at goal    Self Care Goals & Plans:  Self Care Goal 01/07/2016  Manage my medications take my medicines as prescribed; bring my medications to every visit; refill my medications on time  Monitor my health keep track of my blood pressure; keep track of my weight  Eat healthy foods eat foods that are low in salt; eat baked foods instead of fried foods; drink diet soda or water instead of juice or soda  Be physically active find an activity I enjoy; take a walk every day  Meeting treatment goals -    No flowsheet data found.   Care Management & Community Referrals:  Referral 08/18/2014  Referrals made for care management support none needed  Referrals made to community resources none

## 2016-02-25 NOTE — Assessment & Plan Note (Signed)
Patient presents with a 3-4 day hx of productive cough, nasal congestion, chills, and body aches most consistent with a viral upper respiratory infection. She has not spiked a fever and is afebrile here. Will treat her symptoms conservatively with Sudafed, saline nasal spray, robitussin, and ibuprofen as needed. She has already discussed her steroid needs with her endocrinologist, Dr. Buddy Duty, and he does not believe she needs stress steroids for her adrenal insufficiency at this time. I also recommended for her to take hot showers, get plenty of rest, and focus on staying hydrated. I advised for her to follow up in 1 week if her symptoms persist or worsen. She is agreeable to this plan.

## 2016-02-25 NOTE — Progress Notes (Signed)
Internal Medicine Clinic Attending  Case discussed with Dr. Rivet at the time of the visit.  We reviewed the resident's history and exam and pertinent patient test results.  I agree with the assessment, diagnosis, and plan of care documented in the resident's note.  

## 2016-02-25 NOTE — Progress Notes (Signed)
   CC: "Not feeling well"  HPI:  [name] [name] is a 55 y.o. woman with PMHx as noted below who presents today for evaluation of cold symptoms.  She reports 3-4 days ago she started to have chills, sweats, body aches, and a cough. She describes feeling that she has a fever but the highest recorded temperature she had at home was 99 degrees. She reports having a dry hacking cough but occasionally will have yellow sputum. She feels her "head is stuffed up." She has tried OTC Advil cold/sinus and Mucinex with minimal relief. She also notes a sore throat and loose stools, about 3-4 times per day for the last few days. She denies any abdominal pain or vomiting. She reports nausea but states this is not unusual with her since she is on steroids for her adrenal insufficiency. She states she called Dr. Buddy Duty when she was not feeling well and was told she did not need to double her steroid dose. She has been able to tolerate liquids and soup.   Past Medical History:  Diagnosis Date  . Allergic rhinitis 05/09/2006  . Allergy   . Anxiety   . Arthritis   . Asthma   . CHF (congestive heart failure) ([place])   . Chronic back pain   . GERD (gastroesophageal reflux disease)   . Heart murmur    as a child  . 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 s/p 12 week course of simeprevir and sofosbuvir between October 2014 and January 2015 with resolution.  Marland Kitchen History of shingles   . HIV infection ([place])    1994  . Hyperlipidemia    no meds taken now  . Hypertension   . Pituitary microadenoma ([place]) 08/08/2014  . Prediabetes   . Refusal of blood transfusions as patient is Jehovah's Witness   . Secondary adrenal insufficiency ([place]) 06/29/2014    Review of Systems:  All negative except per HPI  Physical Exam:  Vitals:   02/25/16 1032  BP: (!) 151/101  Pulse: 81  Temp: 97.9 F (36.6 C)  TempSrc: Oral  SpO2: 100%  Weight: 175 lb 14.4 oz (79.8 kg)   General:  well-nourished woman who appears ill but in NAD, pleasant HEENT: [place]/AT, EOMI, pharynx non-erythematous, mild tenderness to palpation of maxillary sinuses, mucus membranes moist CV: RRR, no m/g/r Pulm: CTA bilaterally, breaths non-labored Abd: BS+, soft, non-tender, non-distended Ext: warm, no peripheral edema Neuro: alert and oriented x 3  Assessment & Plan:   See Encounters Tab for problem based charting.  Patient discussed with Dr. Dareen Piano

## 2016-02-26 ENCOUNTER — Other Ambulatory Visit: Payer: Self-pay | Admitting: *Deleted

## 2016-02-29 MED ORDER — PANTOPRAZOLE SODIUM 20 MG PO TBEC: 20.0000 mg | DELAYED_RELEASE_TABLET | Freq: Two times a day (BID) | ORAL | 2 refills | Status: DC

## 2016-03-02 ENCOUNTER — Other Ambulatory Visit: Payer: Self-pay | Admitting: Allergy and Immunology

## 2016-03-02 ENCOUNTER — Other Ambulatory Visit: Payer: Self-pay | Admitting: Pulmonary Disease

## 2016-03-08 NOTE — Telephone Encounter (Signed)
rx phoned I.[place], Darlene Cassady11/14/201710:42 AM

## 2016-03-09 DIAGNOSIS — H6992 Unspecified Eustachian tube disorder, left ear: Secondary | ICD-10-CM | POA: Insufficient documentation

## 2016-03-14 ENCOUNTER — Telehealth: Payer: Self-pay

## 2016-03-14 NOTE — Telephone Encounter (Signed)
Returned patient's call she reports she has been having chest pain off and on for the past 3 days nitro helps pain but she stop taking it because it gives her headache. Encouraged patient to go to ER immediately she refused but scheduled appointment to be seen here 03/21/2016 10:15 am. Disccused S/Sh of heart attack encouraged to go to ER if pain returns or she has other symptoms of heart attack. She stated that she just was not going to go the ER because she didn't think it was that serious.

## 2016-03-15 ENCOUNTER — Telehealth: Payer: Self-pay

## 2016-03-15 NOTE — Telephone Encounter (Signed)
Returned call to patient to check on her and encourage her to go to the ER. She reports she is still having chest pain but she refuses to go ER

## 2016-03-15 NOTE — Telephone Encounter (Signed)
Agree that if symptoms persist, she should go to the ED.  If she calls back, would strongly encourage ED evaluation.

## 2016-03-15 NOTE — Telephone Encounter (Signed)
Agree with plan.  Hopefully she will go to the ED if things worsen over the weekend.

## 2016-03-21 ENCOUNTER — Ambulatory Visit (HOSPITAL_COMMUNITY)
Admission: RE | Admit: 2016-03-21 | Discharge: 2016-03-21 | Disposition: A | Discharge status: Home / Self Care | Payer: Medicaid Other | Source: Ambulatory Visit | Attending: Family Medicine | Admitting: Family Medicine

## 2016-03-21 ENCOUNTER — Ambulatory Visit (INDEPENDENT_AMBULATORY_CARE_PROVIDER_SITE_OTHER): Payer: Medicaid Other | Admitting: Internal Medicine

## 2016-03-21 VITALS — BP 137/79 | HR 83 | Temp 98.0°F | Ht 61.0 in | Wt 178.0 lb

## 2016-03-21 DIAGNOSIS — R0789 Other chest pain: Secondary | ICD-10-CM

## 2016-03-21 DIAGNOSIS — R079 Chest pain, unspecified: Secondary | ICD-10-CM | POA: Diagnosis present

## 2016-03-21 MED ORDER — PANTOPRAZOLE SODIUM 40 MG PO TBEC: 40.0000 mg | DELAYED_RELEASE_TABLET | Freq: Two times a day (BID) | ORAL | 2 refills | Status: DC

## 2016-03-21 NOTE — Progress Notes (Signed)
   CC: Chest pain  HPI:  [name] is a 55 y.o. woman with PMHx as noted below who presents today for evaluation of chest pain.  She states the pain started about 1.5 weeks ago. Describes pain as sharp, stabbing, intermittent, lasts only a few seconds, and occasionally will have an "achy, heartburn sensation." Pain has been occurring 3-4 times daily. Pain worse when vacuuming or doing household work. She reports she does not exercise and does not do much activity. Pain gets better with nitroglycerin and on its own. She does not like taking the nitroglycerin due to associated headache. She notes associated shortness of breath, nausea, dizziness, and diaphoresis. However, she is on chronic steroids for her adrenal insufficiency and states nausea and dizziness are not unusual for her. She also notes congestion with an associated cough for the past few days. She notes worsening pain when she coughs. She is currently taking Protonix 20 mg BID for her acid reflux. She denies any pressure sensation.    Past Medical History:  Diagnosis Date  . Allergic rhinitis 05/09/2006  . Allergy   . Anxiety   . Arthritis   . Asthma   . CHF (congestive heart failure) ([place])   . Chronic back pain   . GERD (gastroesophageal reflux disease)   . Heart murmur    as a child  . 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 s/p 12 week course of simeprevir and sofosbuvir between October 2014 and January 2015 with resolution.  Marland Kitchen History of shingles   . HIV infection ([place])    1994  . Hyperlipidemia    no meds taken now  . Hypertension   . Pituitary microadenoma ([place]) 08/08/2014  . Prediabetes   . Refusal of blood transfusions as patient is Jehovah's Witness   . Secondary adrenal insufficiency ([place]) 06/29/2014    Review of Systems:  All negative except per HPI  Physical Exam:  Vitals:   03/21/16 1025  BP: 137/79  Pulse: 83  Temp: 98 F (36.7 C)  TempSrc: Oral  SpO2: 99%    Weight: 178 lb (80.7 kg)  Height: 5\' 1"  (1.549 m)   General: alert, cooperative, NAD HEENT: /AT, EOMI, sclera anicteric, pharynx non-erythematous, maxillary sinuses with mild tenderness CV: RRR, no m/g/r Pulm: CTA bilaterally, breaths non-labored Abd: BS+, soft, obese, non-tender Ext: warm, no peripheral edema Neuro: alert and oriented x 3  Assessment & Plan:   See Encounters Tab for problem based charting.  Patient discussed with Dr. Angelia Mould

## 2016-03-21 NOTE — Patient Instructions (Addendum)
General Instructions: - Try taking Sudafed for your congestion - Avoid foods that can cause indigestion such as chocolate, peppermint, citrus, coffee, alcohol - Can increase Protonix to 40 mg twice daily to see if this improves your symptoms - Will have you follow up in 1 month - If chest pain worsens or becomes prolonged please return to clinic or go to the emergency room     Progress Toward Treatment Goals:  Treatment Goal 04/02/2015  Blood pressure at goal    Self Care Goals & Plans:  Self Care Goal 01/07/2016  Manage my medications take my medicines as prescribed; bring my medications to every visit; refill my medications on time  Monitor my health keep track of my blood pressure; keep track of my weight  Eat healthy foods eat foods that are low in salt; eat baked foods instead of fried foods; drink diet soda or water instead of juice or soda  Be physically active find an activity I enjoy; take a walk every day  Meeting treatment goals -    No flowsheet data found.   Care Management & Community Referrals:  Referral 08/18/2014  Referrals made for care management support none needed  Referrals made to community resources none

## 2016-03-21 NOTE — Assessment & Plan Note (Signed)
Her chest pain sounds very atypical given its quality and duration. It is concerning that the pain gets better with nitroglycerin and worse when she is doing household work. She does have risk factors for CAD including HTN, hyperlipidemia, and HIV. She was evaluated by Cardiology (Dr. Johnsie Cancel) in May this year for atypical chest pain which was attributed to an URI. She was given a prescription for nitroglycerin. Her stress test in 2016 was normal. I performed and reviewed an in office EKG which showed normal sinus rhythm and no evidence of ST or T wave changes to suggest ischemia. Since she has very mild URI symptoms again and describes the pain as being similar to heartburn will treat for both of these conditions and see if her pain improves. We discussed foods to avoid that cause indigestion and to increase her Protonix to 40 mg BID. Also advised Sudafed for her congestion. Recommended that if the pain worsens to return to clinic or go to the emergency room.

## 2016-03-22 ENCOUNTER — Other Ambulatory Visit: Payer: Self-pay | Admitting: Pulmonary Disease

## 2016-03-23 NOTE — Progress Notes (Signed)
Internal Medicine Clinic Attending  Case discussed with Dr. Rivet at the time of the visit.  We reviewed the resident's history and exam and pertinent patient test results.  I agree with the assessment, diagnosis, and plan of care documented in the resident's note.  

## 2016-04-15 ENCOUNTER — Ambulatory Visit: Payer: Medicaid Other

## 2016-04-28 ENCOUNTER — Other Ambulatory Visit: Payer: Self-pay | Admitting: Allergy and Immunology

## 2016-04-28 ENCOUNTER — Other Ambulatory Visit: Payer: Self-pay | Admitting: Pulmonary Disease

## 2016-05-03 ENCOUNTER — Encounter: Payer: Self-pay | Admitting: Internal Medicine

## 2016-05-05 ENCOUNTER — Ambulatory Visit (HOSPITAL_BASED_OUTPATIENT_CLINIC_OR_DEPARTMENT_OTHER): Payer: Medicaid Other | Admitting: Physical Medicine & Rehabilitation

## 2016-05-05 ENCOUNTER — Encounter: Payer: Medicaid Other | Attending: Physical Medicine & Rehabilitation

## 2016-05-05 ENCOUNTER — Encounter: Payer: Self-pay | Admitting: Physical Medicine & Rehabilitation

## 2016-05-05 VITALS — BP 146/88 | HR 78 | Resp 14

## 2016-05-05 DIAGNOSIS — M545 Low back pain: Secondary | ICD-10-CM | POA: Diagnosis present

## 2016-05-05 DIAGNOSIS — I10 Essential (primary) hypertension: Secondary | ICD-10-CM | POA: Diagnosis not present

## 2016-05-05 DIAGNOSIS — G8929 Other chronic pain: Secondary | ICD-10-CM | POA: Diagnosis present

## 2016-05-05 DIAGNOSIS — Z87891 Personal history of nicotine dependence: Secondary | ICD-10-CM | POA: Diagnosis not present

## 2016-05-05 DIAGNOSIS — M47816 Spondylosis without myelopathy or radiculopathy, lumbar region: Secondary | ICD-10-CM | POA: Diagnosis not present

## 2016-05-05 DIAGNOSIS — B20 Human immunodeficiency virus [HIV] disease: Secondary | ICD-10-CM | POA: Insufficient documentation

## 2016-05-05 DIAGNOSIS — D352 Benign neoplasm of pituitary gland: Secondary | ICD-10-CM | POA: Diagnosis not present

## 2016-05-05 DIAGNOSIS — K219 Gastro-esophageal reflux disease without esophagitis: Secondary | ICD-10-CM | POA: Insufficient documentation

## 2016-05-05 DIAGNOSIS — E785 Hyperlipidemia, unspecified: Secondary | ICD-10-CM | POA: Diagnosis not present

## 2016-05-05 DIAGNOSIS — B1920 Unspecified viral hepatitis C without hepatic coma: Secondary | ICD-10-CM | POA: Insufficient documentation

## 2016-05-05 NOTE — Patient Instructions (Signed)
Lumbar medial branch blocks were performed. This is to help diagnose the cause of the low back pain. It is important that you keep track of your pain for the first day or 2 after injection. This injection can give you temporary relief that lasts for hours or up to several months. There is no way to predict duration of pain relief. 3.5 Please try to compare your pain after injection to for the injection.  If this injection gives you  temporary relief there may be another longer-lasting procedure that may be beneficial call radiofrequency ablation

## 2016-05-05 NOTE — Progress Notes (Signed)
Right lumbar L3, L4 medial branch blocks and L5 dorsal ramus injection under fluoroscopic guidance  Indication: Right Lumbar pain which is not relieved by medication management or other conservative care and interfering with self-care and mobility.  Informed consent was obtained after describing risks and benefits of the procedure with the patient, this includes bleeding, bruising, infection, paralysis and medication side effects. The patient wishes to proceed and has given written consent. The patient was placed in a prone position. The lumbar area was marked and prepped with Betadine. One ML of 1% lidocaine was injected into each of 3 areas into the skin and subcutaneous tissue. Then a 22-gauge 3.5in spinal needle was inserted targeting the junction of the Right S1 superior articular process and sacral ala junction. Needle was advanced under fluoroscopic guidance. Bone contact was made. Omnipaque 180 was injected x0.5 mL demonstrating no intravascular uptake. Then a solution containing one ML of 4 mg per mL dexamethasone and 3 mL of 2% MPF lidocaine was injected x0.5 mL. Then the Right L5 superior articular process in transverse process junction was targeted. Bone contact was made. Omnipaque 180 was injected x0.5 mL demonstrating no intravascular uptake. Then a solution containing one ML of 4 mg per mL dexamethasone and 3 mL of 2% MPF lidocaine was injected x0.5 mL. Then the Right L4 superior articular process in transverse process junction was targeted. Bone contact was made. Omnipaque 180 was injected x0.5 mL demonstrating no intravascular uptake. Then a solution containing one ML of 4 mg per mL dexamethasone and 3 mL of 2% MPF lidocaine was injected x0.5 mL Patient tolerated procedure well. Post procedure instructions were given. Please refer to post procedure form. 

## 2016-05-05 NOTE — Progress Notes (Signed)
  PROCEDURE RECORD [facility]   Name: [name] [name] DOB:15-Nov-1960 MRN: [id number]  Date:05/05/2016  Physician: Alysia Penna, MD    Nurse/CMA: Wessling, CMA  Allergies:  Allergies  Allergen Reactions  . Acetaminophen Other (See Comments)    Inflamed liver, hospitalized REACTION: Had liver problems, was hospitalized  . Dyazide [Hydrochlorothiazide W-Triamterene] Hives  . Lisinopril     Cough   . Triamterene Hives  . Morphine Hives and Rash    REACTION: rash    Consent Signed: Yes.    Is patient diabetic? No.  CBG today?   Pregnant: No. LMP: No LMP recorded. Patient is postmenopausal. (age 55-55)  Anticoagulants: no Anti-inflammatory: no Antibiotics: no  Procedure: Medial Branch Block                                      Position: Prone Start Time:   12:09pm     End Time: 12:15pm             Fluoro Time: 23  RN/CMA Yolanda Bonine, CMA Wessling, CMA    Time 11:08 12:19pm    BP 146/88 131/81    Pulse 78 80    Respirations 14 14    O2 Sat 97 96    S/S 6 6    Pain Level 10/10 7/10     D/C home with Juliann Pulse, [name] & O X 3, D/C instructions reviewed, and sits independently.

## 2016-05-06 ENCOUNTER — Ambulatory Visit: Payer: Medicaid Other | Admitting: Physical Medicine & Rehabilitation

## 2016-05-17 ENCOUNTER — Other Ambulatory Visit: Payer: Self-pay | Admitting: Pulmonary Disease

## 2016-05-17 DIAGNOSIS — Z1231 Encounter for screening mammogram for malignant neoplasm of breast: Secondary | ICD-10-CM

## 2016-05-24 ENCOUNTER — Ambulatory Visit (INDEPENDENT_AMBULATORY_CARE_PROVIDER_SITE_OTHER): Payer: Medicaid Other | Admitting: Internal Medicine

## 2016-05-24 ENCOUNTER — Encounter: Payer: Self-pay | Admitting: Internal Medicine

## 2016-05-24 VITALS — BP 119/78 | HR 81 | Temp 98.2°F | Wt 174.4 lb

## 2016-05-24 DIAGNOSIS — Z800 Family history of malignant neoplasm of digestive organs: Secondary | ICD-10-CM

## 2016-05-24 DIAGNOSIS — Z833 Family history of diabetes mellitus: Secondary | ICD-10-CM | POA: Diagnosis not present

## 2016-05-24 DIAGNOSIS — E2749 Other adrenocortical insufficiency: Secondary | ICD-10-CM | POA: Diagnosis not present

## 2016-05-24 DIAGNOSIS — J449 Chronic obstructive pulmonary disease, unspecified: Secondary | ICD-10-CM

## 2016-05-24 DIAGNOSIS — Z8371 Family history of colonic polyps: Secondary | ICD-10-CM

## 2016-05-24 DIAGNOSIS — Z801 Family history of malignant neoplasm of trachea, bronchus and lung: Secondary | ICD-10-CM

## 2016-05-24 DIAGNOSIS — Z7952 Long term (current) use of systemic steroids: Secondary | ICD-10-CM

## 2016-05-24 DIAGNOSIS — Z87891 Personal history of nicotine dependence: Secondary | ICD-10-CM

## 2016-05-24 DIAGNOSIS — Z8249 Family history of ischemic heart disease and other diseases of the circulatory system: Secondary | ICD-10-CM | POA: Diagnosis not present

## 2016-05-24 DIAGNOSIS — E860 Dehydration: Secondary | ICD-10-CM

## 2016-05-24 DIAGNOSIS — Z79899 Other long term (current) drug therapy: Secondary | ICD-10-CM | POA: Diagnosis not present

## 2016-05-24 DIAGNOSIS — Z831 Family history of other infectious and parasitic diseases: Secondary | ICD-10-CM

## 2016-05-24 DIAGNOSIS — Z823 Family history of stroke: Secondary | ICD-10-CM | POA: Diagnosis not present

## 2016-05-24 DIAGNOSIS — J069 Acute upper respiratory infection, unspecified: Secondary | ICD-10-CM | POA: Diagnosis not present

## 2016-05-24 MED ORDER — SODIUM CHLORIDE 0.9 % IV BOLUS (SEPSIS)
1000.0000 mL | Freq: Once | INTRAVENOUS | Status: AC
  Administered 2016-05-24: 1000 mL via INTRAVENOUS

## 2016-05-24 MED ORDER — OSELTAMIVIR PHOSPHATE 75 MG PO CAPS: 75.0000 mg | ORAL_CAPSULE | Freq: Two times a day (BID) | ORAL | 0 refills | Status: AC

## 2016-05-24 MED ORDER — PSEUDOEPHEDRINE HCL 30 MG PO TABS
30.0000 mg | ORAL_TABLET | Freq: Four times a day (QID) | ORAL | 0 refills | Status: DC | PRN
Start: 2016-05-24 — End: 2017-01-03

## 2016-05-24 MED ORDER — GUAIFENESIN-CODEINE 100-10 MG/5ML PO SOLN: 10.0000 mL | Freq: Three times a day (TID) | ORAL | 0 refills | Status: DC | PRN

## 2016-05-24 NOTE — Progress Notes (Signed)
Case discussed with Dr. Strelow at the time of the visit. We reviewed the resident's history and exam and pertinent patient test results. I agree with the assessment, diagnosis, and plan of care documented in the resident's note. 

## 2016-05-24 NOTE — Patient Instructions (Addendum)
You have an acute viral illness which is causing you to feel so poorly. I will recommend several treatments in order to help manage her symptoms. We have given use of fluid in your veins today in order to help with your dizziness and dehydration. Please remember to drink plenty of fluids at home. If your symptoms of dizziness worsen or you're unable to drink fluid and keep it down please call our clinic for further instructions.  1) For your sinus congestion, please take Sudafed every 6 hours as needed. 2) For your cough, please take the cough syrup 3 times a day as needed. 3) For your muscle aches, please take Advil or Motrin 3 times a day as needed. 4) Given concern for the flu, please take Tamiflu twice a day for 5 days. 5) We will increase your hydrocortisone for the next 3 days, please take 3 times your normal daily dose. Take 30 mg in the morning, 15 mg in the afternoon, and 15 mg in the evening.  If you're not feeling like you're getting better after for 5 days please call our clinic back for further instructions.

## 2016-05-24 NOTE — Progress Notes (Signed)
CC: flu like illness HPI: [name] [name] is a 55 y.o. female with a h/o of secondary adrenal insufficiency, COPD, HTN, HIV, chronic Eustachian tube dysfunction, liver fibrosis who presents for URI and flu like symptoms.  Please see Problem-based charting for HPI and the status of patient's chronic medical conditions.  Past Medical History:  Diagnosis Date  . Allergic rhinitis 05/09/2006  . Allergy   . Anxiety   . Arthritis   . Asthma   . CHF (congestive heart failure) ([place])   . Chronic back pain   . GERD (gastroesophageal reflux disease)   . Heart murmur    as a child  . 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 s/p 12 week course of simeprevir and sofosbuvir between October 2014 and January 2015 with resolution.  Marland Kitchen History of shingles   . HIV infection ([place])    1994  . Hyperlipidemia    no meds taken now  . Hypertension   . Pituitary microadenoma ([place]) 08/08/2014  . Prediabetes   . Refusal of blood transfusions as patient is Jehovah's Witness   . Secondary adrenal insufficiency ([place]) 06/29/2014   Social History  Substance Use Topics  . Smoking status: Former Smoker    Years: 20.00    Types: Cigarettes    Quit date: 04/26/2007  . Smokeless tobacco: Never Used     Comment: QUIT 2009  . Alcohol use No   Family History  Problem Relation Age of Onset  . Heart disease Mother   . Diabetes Mother   . Stroke Mother   . Heart disease Father   . Stroke Father   . Diabetes Father   . Hepatitis Sister     hcv  . Stroke Other   . Colon polyps Brother   . Cancer Sister     lung  . Colon cancer Neg Hx   . Esophageal cancer Neg Hx   . Stomach cancer Neg Hx   . Rectal cancer Neg Hx    Review of Systems: ROS in HPI. Otherwise: ROS Physical Exam: Vitals:   05/24/16 1103  BP: 119/78  Pulse: 81  Temp: 98.2 F (36.8 C)  TempSrc: Oral  SpO2: 98%  Weight: 174 lb 6.4 oz (79.1 kg)   Physical Exam  Assessment & Plan:  See encounters  tab for problem based medical decision making. Patient discussed with Dr. Eppie Gibson  Acute URI Patient presented with a three-day history of upper respiratory symptoms. She notes significant cough which is productive of white sputum, significant sinus pressure and pain over the maxillary or frontal sinuses as well as ear pressure and pain. She also endorses diffuse myalgias in her extremities, chills, and fevers at home up to 101.0. Patient denies any shortness of breath or chest pain other than that associated with coughing. She has a history of COPD and has been using her albuterol inhaler more frequently for increased wheezing that has only been requiring this several times a day. Patient has attempted to use an old prescription for Tussionex but only had a few doses available which had limited benefit. She has not tried any other over-the-counter remedies. Patient did receive her flu vaccine last fall. She denies any sick contacts.  Patient contact her endocrinologist when her symptoms began and they recommended doubling her hydrocortisone from 10-5-5 to 20-5-5 for 3 days. She is on day 2 of this regimen but has not noted significant improvement. Patient complains of significant  dizziness, is that she has been able to keep her fluids down only reporting mild nausea. She has had several episodes of diarrhea.  Patient's blood pressure is low today at 119/78 from her baseline of borderline hypertension which is treated with multiple medications. On orthostatic vital sign testing she has a significant drop in her systolic blood blood pressure of 29 points from lying to standing.  Assessment: Patient presents with a viral flulike illness and apparent dehydration in the setting of secondary adrenal insufficiency on chronic hydrocortisone. Patient does not have significant shortness of breath or concern for pneumonia at this time and has no signs of significant exacerbation of her COPD.  Plan: 1 L normal  saline IV fluid bolus in the clinic today, Initiated 5 day course of 75 mg Tamiflu two times a day. Symptomatic treatment of her URI complaints, we'll prescribe pseudoephedrine 30 mg every 6 hours when necessary (patient was instructed to monitor blood pressure and discontinue this medication if her systolic blood pressure rises above 99991111 or diastolic blood pressure rises above 100). We'll prescribe guaifenesin-codeine cough syrup to be used 3 times a day as needed for cough.  I would also recommend increasing her hydrocortisone to 3 times her normal daily dose. She was instructed to take 30-15-15 milligrams hydrocortisone daily for the next 3 days.  If she is not feeling better in the next 4-5 days I recommend that she return to clinic for further evaluation. Additionally if she were to notice that her dizziness gets worse or that she is unable to tolerate oral hydration she should return to clinic for concern of worsening dehydration.   Signed: Holley Raring, MD 05/24/2016, 11:55 AM  Pager: [phone]

## 2016-05-24 NOTE — Assessment & Plan Note (Signed)
Patient presented with a three-day history of upper respiratory symptoms. She notes significant cough which is productive of white sputum, significant sinus pressure and pain over the maxillary or frontal sinuses as well as ear pressure and pain. She also endorses diffuse myalgias in her extremities, chills, and fevers at home up to 101.0. Patient denies any shortness of breath or chest pain other than that associated with coughing. She has a history of COPD and has been using her albuterol inhaler more frequently for increased wheezing that has only been requiring this several times a day. Patient has attempted to use an old prescription for Tussionex but only had a few doses available which had limited benefit. She has not tried any other over-the-counter remedies. Patient did receive her flu vaccine last fall. She denies any sick contacts.  Patient contact her endocrinologist when her symptoms began and they recommended doubling her hydrocortisone from 10-5-5 to 20-5-5 for 3 days. She is on day 2 of this regimen but has not noted significant improvement. Patient complains of significant dizziness, is that she has been able to keep her fluids down only reporting mild nausea. She has had several episodes of diarrhea.  Patient's blood pressure is low today at 119/78 from her baseline of borderline hypertension which is treated with multiple medications. On orthostatic vital sign testing she has a significant drop in her systolic blood blood pressure of 29 points from lying to standing.  Assessment: Patient presents with a viral flulike illness and apparent dehydration in the setting of secondary adrenal insufficiency on chronic hydrocortisone. Patient does not have significant shortness of breath or concern for pneumonia at this time and has no signs of significant exacerbation of her COPD.  Plan: 1 L normal saline IV fluid bolus in the clinic today, Initiated 5 day course of 75 mg Tamiflu two times a  day. Symptomatic treatment of her URI complaints, we'll prescribe pseudoephedrine 30 mg every 6 hours when necessary (patient was instructed to monitor blood pressure and discontinue this medication if her systolic blood pressure rises above 99991111 or diastolic blood pressure rises above 100). We'll prescribe guaifenesin-codeine cough syrup to be used 3 times a day as needed for cough.  I would also recommend increasing her hydrocortisone to 3 times her normal daily dose. She was instructed to take 30-15-15 milligrams hydrocortisone daily for the next 3 days.  If she is not feeling better in the next 4-5 days I recommend that she return to clinic for further evaluation. Additionally if she were to notice that her dizziness gets worse or that she is unable to tolerate oral hydration she should return to clinic for concern of worsening dehydration.

## 2016-05-26 ENCOUNTER — Other Ambulatory Visit: Payer: Self-pay | Admitting: Internal Medicine

## 2016-05-26 ENCOUNTER — Other Ambulatory Visit: Payer: Self-pay | Admitting: Allergy and Immunology

## 2016-06-03 ENCOUNTER — Ambulatory Visit
Admission: RE | Admit: 2016-06-03 | Discharge: 2016-06-03 | Disposition: A | Discharge status: Home / Self Care | Payer: Medicaid Other | Source: Ambulatory Visit | Attending: Pulmonary Disease | Admitting: Pulmonary Disease

## 2016-06-03 DIAGNOSIS — Z1231 Encounter for screening mammogram for malignant neoplasm of breast: Secondary | ICD-10-CM | POA: Diagnosis not present

## 2016-06-13 ENCOUNTER — Other Ambulatory Visit: Payer: Self-pay | Admitting: Internal Medicine

## 2016-06-13 DIAGNOSIS — H538 Other visual disturbances: Secondary | ICD-10-CM

## 2016-06-13 DIAGNOSIS — D352 Benign neoplasm of pituitary gland: Secondary | ICD-10-CM

## 2016-06-13 DIAGNOSIS — R51 Headache: Principal | ICD-10-CM

## 2016-06-13 DIAGNOSIS — R519 Headache, unspecified: Secondary | ICD-10-CM

## 2016-06-15 DIAGNOSIS — H6983 Other specified disorders of Eustachian tube, bilateral: Secondary | ICD-10-CM | POA: Insufficient documentation

## 2016-06-15 DIAGNOSIS — H9201 Otalgia, right ear: Secondary | ICD-10-CM | POA: Insufficient documentation

## 2016-06-15 DIAGNOSIS — H6993 Unspecified Eustachian tube disorder, bilateral: Secondary | ICD-10-CM | POA: Insufficient documentation

## 2016-06-22 ENCOUNTER — Other Ambulatory Visit: Payer: Self-pay | Admitting: Allergy and Immunology

## 2016-06-22 ENCOUNTER — Other Ambulatory Visit: Payer: Self-pay | Admitting: Pulmonary Disease

## 2016-06-22 ENCOUNTER — Other Ambulatory Visit: Payer: Medicaid Other

## 2016-06-23 ENCOUNTER — Other Ambulatory Visit: Payer: Self-pay | Admitting: *Deleted

## 2016-06-23 DIAGNOSIS — B20 Human immunodeficiency virus [HIV] disease: Secondary | ICD-10-CM

## 2016-06-23 MED ORDER — DARUNAVIR-COBICISTAT 800-150 MG PO TABS: 1.0000 | ORAL_TABLET | Freq: Every day | ORAL | 3 refills | Status: DC

## 2016-06-24 ENCOUNTER — Ambulatory Visit
Admission: RE | Admit: 2016-06-24 | Discharge: 2016-06-24 | Disposition: A | Discharge status: Home / Self Care | Payer: Medicaid Other | Source: Ambulatory Visit | Attending: Internal Medicine | Admitting: Internal Medicine

## 2016-06-24 DIAGNOSIS — R519 Headache, unspecified: Secondary | ICD-10-CM

## 2016-06-24 DIAGNOSIS — R51 Headache: Principal | ICD-10-CM

## 2016-06-24 DIAGNOSIS — D352 Benign neoplasm of pituitary gland: Secondary | ICD-10-CM

## 2016-06-24 DIAGNOSIS — H538 Other visual disturbances: Secondary | ICD-10-CM

## 2016-06-24 MED ORDER — GADOBENATE DIMEGLUMINE 529 MG/ML IV SOLN
8.0000 mL | Freq: Once | INTRAVENOUS | Status: AC | PRN
  Administered 2016-06-24: 8 mL via INTRAVENOUS

## 2016-06-24 MED ORDER — GADOBENATE DIMEGLUMINE 529 MG/ML IV SOLN: 15.0000 mL | Freq: Once | INTRAVENOUS | Status: DC | PRN

## 2016-07-07 ENCOUNTER — Ambulatory Visit (INDEPENDENT_AMBULATORY_CARE_PROVIDER_SITE_OTHER): Payer: Medicaid Other | Admitting: Pulmonary Disease

## 2016-07-07 ENCOUNTER — Other Ambulatory Visit (HOSPITAL_COMMUNITY)
Admission: RE | Admit: 2016-07-07 | Discharge: 2016-07-07 | Disposition: A | Discharge status: Home / Self Care | Payer: Medicaid Other | Source: Ambulatory Visit | Attending: Internal Medicine | Admitting: Internal Medicine

## 2016-07-07 ENCOUNTER — Encounter: Payer: Self-pay | Admitting: Pulmonary Disease

## 2016-07-07 VITALS — BP 134/84 | HR 80 | Temp 98.3°F | Ht 61.0 in | Wt 177.6 lb

## 2016-07-07 DIAGNOSIS — R7303 Prediabetes: Secondary | ICD-10-CM | POA: Diagnosis not present

## 2016-07-07 DIAGNOSIS — Z1151 Encounter for screening for human papillomavirus (HPV): Secondary | ICD-10-CM | POA: Diagnosis present

## 2016-07-07 DIAGNOSIS — K219 Gastro-esophageal reflux disease without esophagitis: Secondary | ICD-10-CM

## 2016-07-07 DIAGNOSIS — Z79899 Other long term (current) drug therapy: Secondary | ICD-10-CM | POA: Diagnosis not present

## 2016-07-07 DIAGNOSIS — E559 Vitamin D deficiency, unspecified: Secondary | ICD-10-CM

## 2016-07-07 DIAGNOSIS — Z124 Encounter for screening for malignant neoplasm of cervix: Secondary | ICD-10-CM

## 2016-07-07 DIAGNOSIS — I10 Essential (primary) hypertension: Secondary | ICD-10-CM

## 2016-07-07 DIAGNOSIS — Z0000 Encounter for general adult medical examination without abnormal findings: Secondary | ICD-10-CM

## 2016-07-07 DIAGNOSIS — Z87891 Personal history of nicotine dependence: Secondary | ICD-10-CM

## 2016-07-07 NOTE — Progress Notes (Signed)
   CC: GERD  HPI:  [name] [name] is a 56 y.o. woman with HIV, treated hepatitis C, liver fibrosis, hypertension, GERD, adrenal insufficiency, prediabetes, chronic back pain presenting follow up of GERD.  Feeling better overall.   Sometimes GERD worse now but now back to once a day dosing of her PPI. Trying to eat earlier. Sleeps in reclined position. Avoiding spicy foods.    Past Medical History:  Diagnosis Date  . Allergic rhinitis 05/09/2006  . Allergy   . Anxiety   . Arthritis   . Asthma   . CHF (congestive heart failure) ([place])   . Chronic back pain   . GERD (gastroesophageal reflux disease)   . Heart murmur    as a child  . 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 s/p 12 week course of simeprevir and sofosbuvir between October 2014 and January 2015 with resolution.  Marland Kitchen History of shingles   . HIV infection ([place])    1994  . Hyperlipidemia    no meds taken now  . Hypertension   . Pituitary microadenoma ([place]) 08/08/2014  . Prediabetes   . Refusal of blood transfusions as patient is Jehovah's Witness   . Secondary adrenal insufficiency ([facility]) 06/29/2014    Review of Systems:   +Chronic nausea +Polydipsia  Physical Exam:  Vitals:   07/07/16 1339  BP: 134/84  Pulse: 80  Temp: 98.3 F (36.8 C)  TempSrc: Oral  SpO2: 98%  Weight: 177 lb 9.6 oz (80.6 kg)  Height: 5\' 1"  (1.549 m)   General Apperance: NAD, central obesity HEENT: Normocephalic, atraumatic, anicteric sclera Neck: Supple, trachea midline Lungs: Clear to auscultation bilaterally. No wheezes, rhonchi or rales. Breathing comfortably Heart: Regular rate and rhythm Abdomen: Soft, nontender, nondistended, no rebound/guarding GU: Cervical os visualized. No obvious lesions. Minimal white discharge at cervix Extremities: Warm and well perfused, no edema Skin: No rashes or lesions Neurologic: Alert and interactive. No gross deficits.  Assessment & Plan:   See Encounters  Tab for problem based charting.  Patient discussed with Dr. Eppie Gibson

## 2016-07-07 NOTE — Patient Instructions (Signed)
Is there anything I can do on my own to improve my symptoms? - Yes. You might feel better if you: ?Lose weight (if you are overweight) ?Raise the head of your bed by 6 to 8 inches (for example, by putting blocks of wood or rubber under 2 legs of the bed or a Styrofoam wedge under the mattress) ?Avoid foods that make your symptoms worse (examples include coffee, chocolate, alcohol, peppermint, and fatty foods) ?Cut down on the amount of alcohol you drink ?Stop smoking, if you smoke ?Avoid lying down for 3 hours after a meal  Follow up in 3-4 months

## 2016-07-08 LAB — VITAMIN D 25 HYDROXY (VIT D DEFICIENCY, FRACTURES): VIT D 25 HYDROXY: 26.9 ng/mL — AB (ref 30.0–100.0)

## 2016-07-08 NOTE — Assessment & Plan Note (Signed)
Per patient her A1c has increased to around 7%. May be due to recent viral illnessess with increased steroid dosing. Her endocrinologist is monitoring for now.

## 2016-07-08 NOTE — Assessment & Plan Note (Signed)
Pap smear done 

## 2016-07-08 NOTE — Assessment & Plan Note (Signed)
Assessment: BP at goal today, 134/84  Plan: Continue amlodipine 5mg  daily, metoprolol tartrate 25mg  BID

## 2016-07-08 NOTE — Assessment & Plan Note (Signed)
Vitamin D lvl 26.9. Can continue current dosing of 1000u daily.

## 2016-07-08 NOTE — Assessment & Plan Note (Signed)
Assessment: She was briefly on BID dosing of her PPI which improved her symptoms. Now back on daily dosing. No weight loss. Occasionally has trouble swallowing certain foods but no dysphagia. No odynophagia. No melena or hematemesis.   Plan: Educated patient on lifestyle modifications Work on weight loss  Continue PPI daily

## 2016-07-09 NOTE — Progress Notes (Signed)
Case discussed with Dr. Krall soon after the resident saw the patient.  We reviewed the resident's history and exam and pertinent patient test results.  I agree with the assessment, diagnosis, and plan of care documented in the resident's note. 

## 2016-07-11 ENCOUNTER — Telehealth: Payer: Self-pay | Admitting: *Deleted

## 2016-07-11 LAB — CYTOLOGY - PAP
Adequacy: ABSENT
Diagnosis: NEGATIVE
HPV (WINDOPATH): NOT DETECTED

## 2016-07-11 NOTE — Telephone Encounter (Signed)
Pt called / informed " Vitamin D lvl was ok. Can continue current vitamin D 1000u daily." per Dr Randell Patient. Voiced understanding.

## 2016-07-12 ENCOUNTER — Other Ambulatory Visit: Payer: Medicaid Other

## 2016-07-12 DIAGNOSIS — Z79899 Other long term (current) drug therapy: Secondary | ICD-10-CM

## 2016-07-12 DIAGNOSIS — Z113 Encounter for screening for infections with a predominantly sexual mode of transmission: Secondary | ICD-10-CM

## 2016-07-12 DIAGNOSIS — B20 Human immunodeficiency virus [HIV] disease: Secondary | ICD-10-CM

## 2016-07-12 LAB — CBC
HCT: 40.8 % (ref 35.0–45.0)
Hemoglobin: 12.9 g/dL (ref 11.7–15.5)
MCH: 26.1 pg — ABNORMAL LOW (ref 27.0–33.0)
MCHC: 31.6 g/dL — AB (ref 32.0–36.0)
MCV: 82.4 fL (ref 80.0–100.0)
MPV: 10.9 fL (ref 7.5–12.5)
PLATELETS: 265 10*3/uL (ref 140–400)
RBC: 4.95 MIL/uL (ref 3.80–5.10)
RDW: 16.9 % — AB (ref 11.0–15.0)
WBC: 9 10*3/uL (ref 3.8–10.8)

## 2016-07-12 LAB — LIPID PANEL
CHOL/HDL RATIO: 3.8 ratio (ref <5.0)
Cholesterol: 155 mg/dL (ref <200)
HDL: 41 mg/dL — AB (ref >50)
LDL CALC: 70 mg/dL (ref <100)
TRIGLYCERIDES: 219 mg/dL — AB (ref <150)
VLDL: 44 mg/dL — ABNORMAL HIGH (ref <30)

## 2016-07-12 LAB — COMPREHENSIVE METABOLIC PANEL
ALK PHOS: 87 U/L (ref 33–130)
ALT: 14 U/L (ref 6–29)
AST: 14 U/L (ref 10–35)
Albumin: 4.1 g/dL (ref 3.6–5.1)
BUN: 8 mg/dL (ref 7–25)
CO2: 24 mmol/L (ref 20–31)
Calcium: 8.9 mg/dL (ref 8.6–10.4)
Chloride: 103 mmol/L (ref 98–110)
Creat: 0.86 mg/dL (ref 0.50–1.05)
GLUCOSE: 201 mg/dL — AB (ref 65–99)
POTASSIUM: 3.8 mmol/L (ref 3.5–5.3)
Sodium: 139 mmol/L (ref 135–146)
Total Bilirubin: 0.4 mg/dL (ref 0.2–1.2)
Total Protein: 7.1 g/dL (ref 6.1–8.1)

## 2016-07-13 ENCOUNTER — Other Ambulatory Visit: Payer: Medicaid Other

## 2016-07-13 LAB — T-HELPER CELL (CD4) - (RCID CLINIC ONLY)
CD4 T CELL HELPER: 23 % — AB (ref 33–55)
CD4 T Cell Abs: 640 /uL (ref 400–2700)

## 2016-07-13 LAB — RPR

## 2016-07-14 ENCOUNTER — Other Ambulatory Visit: Payer: Self-pay | Admitting: Pulmonary Disease

## 2016-07-14 ENCOUNTER — Other Ambulatory Visit: Payer: Self-pay | Admitting: Allergy and Immunology

## 2016-07-14 LAB — HIV-1 RNA QUANT-NO REFLEX-BLD
HIV 1 RNA QUANT: DETECTED {copies}/mL — AB
HIV-1 RNA Quant, Log: <1.3 Log copies/mL — AB

## 2016-07-14 NOTE — Telephone Encounter (Signed)
I refilled this 3 weeks ago with 1 refill.

## 2016-07-18 NOTE — Telephone Encounter (Signed)
Previous refill on 06/23/2016 was never received by pharmacy as the order class was set to "print".  Rx phoned into pharmacy today with one additional refill.Regenia Skeeter, Laurice Iglesia Cassady3/26/20189:29 AM

## 2016-07-19 ENCOUNTER — Other Ambulatory Visit: Payer: Self-pay | Admitting: Pulmonary Disease

## 2016-07-19 ENCOUNTER — Other Ambulatory Visit: Payer: Self-pay | Admitting: Physical Medicine & Rehabilitation

## 2016-07-20 NOTE — Telephone Encounter (Signed)
Recieved electronic refill request for tramadol, no mention in previous notes as to continue this medication, please advise

## 2016-07-20 NOTE — Telephone Encounter (Signed)
Tramadol 50mg   2 po BID #120 1 refill

## 2016-07-20 NOTE — Telephone Encounter (Signed)
Hey Dr. Read Drivers, did you mean to right only for a 15 day supply of medication for this patient for the tramadol?   TAKE 2 TABLETS BY MOUTH EVERY 12 HOURS AS NEEDED Disp: 60 tablet Refills: 1   Class: Print Start: 07/20/2016  Approved by: Charlett Blake, MD  Please advise.

## 2016-07-21 MED ORDER — TRAMADOL HCL 50 MG PO TABS: ORAL_TABLET | ORAL | 1 refills | Status: DC

## 2016-07-21 NOTE — Addendum Note (Signed)
Addended by: Marland Mcalpine B on: 07/21/2016 07:59 AM   Modules accepted: Orders

## 2016-07-25 ENCOUNTER — Other Ambulatory Visit: Payer: Self-pay

## 2016-07-26 ENCOUNTER — Encounter: Payer: Self-pay | Admitting: Dietician

## 2016-07-26 ENCOUNTER — Encounter: Payer: Medicaid Other | Attending: Internal Medicine | Admitting: Dietician

## 2016-07-26 DIAGNOSIS — E119 Type 2 diabetes mellitus without complications: Secondary | ICD-10-CM | POA: Diagnosis not present

## 2016-07-26 DIAGNOSIS — Z713 Dietary counseling and surveillance: Secondary | ICD-10-CM | POA: Diagnosis not present

## 2016-07-26 NOTE — Patient Instructions (Addendum)
Rethink what you drink.  Aim for beverages without carbohydrate. Rethink your snacks.  Are you hungry or eating for another reason? Bake, broil or grill rather than fry. Be a spreader not a glopper. Increasing your non- starchy vegetable intake. Eat slowly.  Stop when you are satisfied. Your decisions start in the grocery store.    Aim for 2-3 Carb Choices per meal (30-45 grams) +/- 1 either way  Aim for 0-1 Carbs per snack if hungry  Include protein in moderation with your meals and snacks Consider reading food labels for Total Carbohydrate and Fat Grams of foods Consider  increasing your activity level by walking or swimming or armchair exercise for 15-30 minutes daily as tolerated

## 2016-07-26 NOTE — Progress Notes (Signed)
Diabetes Self-Management Education  Visit Type: First/Initial  Appt. Start Time: 1530 Appt. End Time: 4174  07/26/2016  [name] [name], identified by name and date of birth, is a 56 y.o. female with a diagnosis of Diabetes: Type 2. Other hx includes hyperlipidemia, HTN, CHF, HIV, GERD.  She also has Addison's disease and is on chronic steroids and has had recent steroid injection in her back.  She also has a vitamin D deficiency with a Vitamin D of 26.9 (07/07/16).  Patient shares a home with her brother and does the shopping and cooking.  She is on disability.  ASSESSMENT  Height 5\' 1"  (1.549 m), weight 175 lb (79.4 kg). Body mass index is 33.07 kg/m.      Diabetes Self-Management Education - 07/26/16 1541      Visit Information   Visit Type First/Initial     Initial Visit   Diabetes Type Type 2   Are you currently following a meal plan? Yes   What type of meal plan do you follow? healthy   Are you taking your medications as prescribed? Yes   Date Diagnosed 06/13/16     Health Coping   How would you rate your overall health? Fair     Psychosocial Assessment   Patient Belief/Attitude about Diabetes Motivated to manage diabetes   Self-care barriers None   Self-management support Doctor's office;Family   Other persons present Patient;Family Member  neice   Patient Concerns Nutrition/Meal planning;Healthy Lifestyle;Other (comment)  exercise that would work with medical issues   Special Needs None   Preferred Learning Style No preference indicated   Learning Readiness Ready   How often do you need to have someone help you when you read instructions, pamphlets, or other written materials from your doctor or pharmacy? 1 - Never   What is the last grade level you completed in school? 12th grade     Pre-Education Assessment   Patient understands the diabetes disease and treatment process. Needs Review   Patient understands incorporating nutritional management into lifestyle.  Needs Review   Patient undertands incorporating physical activity into lifestyle. Needs Review   Patient understands using medications safely. Needs Review   Patient understands monitoring blood glucose, interpreting and using results Needs Review   Patient understands prevention, detection, and treatment of acute complications. Needs Review   Patient understands prevention, detection, and treatment of chronic complications. Needs Review   Patient understands how to develop strategies to address psychosocial issues. Needs Review   Patient understands how to develop strategies to promote health/change behavior. Needs Review     Complications   Last HgB A1C per patient/outside source 7.1 %  2/18   How often do you check your blood sugar? 0 times/day (not testing)   Have you had a dilated eye exam in the past 12 months? Yes   Have you had a dental exam in the past 12 months? Yes   Are you checking your feet? Yes   How many days per week are you checking your feet? 7     Dietary Intake   Breakfast Honey nut cheerios or other cereal and 2% milk  8:00 am   Snack (morning) none   Lunch sandwich OR salad (sometimes fast food), chips, cookies or cake or other sweet or fruit  12:30-1   Snack (afternoon) occasionally yogurt, chips, granola bar, nuts   Dinner chicken or steak or fish or pork (sometimes fried although decreased recently), pasta or potatoes, vegetables   Snack (evening) occasional  yogurt, chips, granola bar, nuts, Little Debbie Cookie   Beverage(s) water, 2 cans regular soda per day, hot tea with "a lot of honey", occasional juice          Exercise   Exercise Type ADL's   How many days per week to you exercise? 0   How many minutes per day do you exercise? 0   Total minutes per week of exercise 0     Patient Education   Previous Diabetes Education Yes (please comment)  only with mother years ago   Disease state  Definition of diabetes, type 1 and 2, and the diagnosis of  diabetes;Factors that contribute to the development of diabetes   Nutrition management  Role of diet in the treatment of diabetes and the relationship between the three main macronutrients and blood glucose level   Physical activity and exercise  Role of exercise on diabetes management, blood pressure control and cardiac health.;Helped patient identify appropriate exercises in relation to his/her diabetes, diabetes complications and other health issue.   Monitoring Identified appropriate SMBG and/or A1C goals.;Purpose and frequency of SMBG.;Yearly dilated eye exam;Daily foot exams   Acute complications Taught treatment of hypoglycemia - the 15 rule.   Chronic complications Relationship between chronic complications and blood glucose control;Retinopathy and reason for yearly dilated eye exams   Psychosocial adjustment Worked with patient to identify barriers to care and solutions;Role of stress on diabetes;Identified and addressed patients feelings and concerns about diabetes   Personal strategies to promote health Lifestyle issues that need to be addressed for better diabetes care     Individualized Goals (developed by patient)   Nutrition General guidelines for healthy choices and portions discussed   Physical Activity Exercise 3-5 times per week;30 minutes per day   Medications Not Applicable   Monitoring  Not Applicable   Reducing Risk Other (comment)  decrease sweets and portion sizes of fat   Health Coping discuss diabetes with (comment)  family, MD/RD     Post-Education Assessment   Patient understands the diabetes disease and treatment process. Demonstrates understanding / competency   Patient understands incorporating nutritional management into lifestyle. Demonstrates understanding / competency   Patient undertands incorporating physical activity into lifestyle. Demonstrates understanding / competency   Patient understands using medications safely. Demonstrates understanding /  competency   Patient understands monitoring blood glucose, interpreting and using results Demonstrates understanding / competency   Patient understands prevention, detection, and treatment of acute complications. Demonstrates understanding / competency   Patient understands prevention, detection, and treatment of chronic complications. Demonstrates understanding / competency   Patient understands how to develop strategies to address psychosocial issues. Demonstrates understanding / competency   Patient understands how to develop strategies to promote health/change behavior. Demonstrates understanding / competency     Outcomes   Expected Outcomes Demonstrated interest in learning. Expect positive outcomes   Future DMSE PRN   Program Status Completed      Individualized Plan for Diabetes Self-Management Training:   Learning Objective:  Patient will have a greater understanding of diabetes self-management. Patient education plan is to attend individual and/or group sessions per assessed needs and concerns.   Plan:   Patient Instructions  Rethink what you drink.  Aim for beverages without carbohydrate. Rethink your snacks.  Are you hungry or eating for another reason? Bake, broil or grill rather than fry. Be a spreader not a glopper. Increasing your non- starchy vegetable intake. Eat slowly.  Stop when you are satisfied. Your decisions start in  the grocery store.    Aim for 2-3 Carb Choices per meal (30-45 grams) +/- 1 either way  Aim for 0-1 Carbs per snack if hungry  Include protein in moderation with your meals and snacks Consider reading food labels for Total Carbohydrate and Fat Grams of foods Consider  increasing your activity level by walking or swimming or armchair exercise for 15-30 minutes daily as tolerated      Expected Outcomes:  Demonstrated interest in learning. Expect positive outcomes  Education material provided: Living Well with Diabetes, Food label handouts,  A1C conversion sheet, Meal plan card, My Plate and Snack sheet, Arm Chair Exercises  If problems or questions, patient to contact team via:  Phone and Email  Future DSME appointment: PRN

## 2016-07-27 ENCOUNTER — Encounter: Payer: Self-pay | Admitting: Infectious Diseases

## 2016-07-27 ENCOUNTER — Ambulatory Visit (INDEPENDENT_AMBULATORY_CARE_PROVIDER_SITE_OTHER): Payer: Medicaid Other | Admitting: Infectious Diseases

## 2016-07-27 VITALS — BP 138/88 | HR 90 | Temp 97.4°F | Wt 172.0 lb

## 2016-07-27 DIAGNOSIS — B182 Chronic viral hepatitis C: Secondary | ICD-10-CM | POA: Diagnosis not present

## 2016-07-27 DIAGNOSIS — M47816 Spondylosis without myelopathy or radiculopathy, lumbar region: Secondary | ICD-10-CM | POA: Diagnosis not present

## 2016-07-27 DIAGNOSIS — Z113 Encounter for screening for infections with a predominantly sexual mode of transmission: Secondary | ICD-10-CM

## 2016-07-27 DIAGNOSIS — K7400 Hepatic fibrosis, unspecified: Secondary | ICD-10-CM

## 2016-07-27 DIAGNOSIS — Z79899 Other long term (current) drug therapy: Secondary | ICD-10-CM

## 2016-07-27 DIAGNOSIS — B20 Human immunodeficiency virus [HIV] disease: Secondary | ICD-10-CM | POA: Diagnosis present

## 2016-07-27 DIAGNOSIS — R7303 Prediabetes: Secondary | ICD-10-CM

## 2016-07-27 DIAGNOSIS — E785 Hyperlipidemia, unspecified: Secondary | ICD-10-CM | POA: Diagnosis not present

## 2016-07-27 DIAGNOSIS — E1169 Type 2 diabetes mellitus with other specified complication: Secondary | ICD-10-CM

## 2016-07-27 NOTE — Assessment & Plan Note (Signed)
Will d/I pharm to see if we can change her to biktarvy to hopefully improve her lipodystrohy, DM.  She is otherwise doing very well- has had vax, pap, mammo.  Will d/c pharm in next 24h and make change to her art.

## 2016-07-27 NOTE — Assessment & Plan Note (Addendum)
My great appreciation to [facility] for assistance with her pain mgmt. Marland Kitchen

## 2016-07-27 NOTE — Progress Notes (Signed)
   Subjective:    [name], female    DOB: Sep 19, 1960, 56 y.o.   MRN: [id number]  HPI 56 yo F with hx of Hep C (tx at [facility] completed 04-2013, repeat Hep C RNA -) and HIV+ (prev LXVr/TRV). She had nasal septoplasty 01-05-10. She was previously changed to [place] in 2013. She was then changed to TRV/DTGV in anticipation of starting Hep C treatment.  She developed a rash on this and was then changed to ISN/TRV (which she takes with hydroxizine due to pruritis, rash).  She was changed to ATVc/TRV which her rash persisted on, then was taken off ART. Was seen December 2015 and restarted ART due to low CD4. Now on DRVc-TRV (prezcobix/truvada).  Was hospitalized in April 2016 and found to have pituitary adenoma (being watched), addison's disease, borderline DM. She is somewhat down today- recently told she has DM2 now. Has seen nutrition and has not started on meds yet. Has changed her diet.  She has not been able to get spinal injections due to the machine being down at [facility]. Has not been able to exercise.  She was started on lipitor by endo recently. She has been having muscle aches since.    HIV 1 RNA Quant (copies/mL)  Date Value  07/12/2016 <20 DETECTED (A)  11/24/2015 <20  03/09/2015 <20   CD4 T Cell Abs (/uL)  Date Value  07/12/2016 640  11/24/2015 570  03/09/2015 880    Review of Systems  Constitutional: Negative for appetite change and unexpected weight change.  Respiratory: Negative for shortness of breath.   Cardiovascular: Positive for chest pain.  Gastrointestinal: Negative for constipation and diarrhea.  Genitourinary: Negative for difficulty urinating.  Musculoskeletal: Positive for myalgias.  Neurological: Positive for numbness. Negative for headaches.  LE numbness after prolonged sitting.  CP with exertion. Has prn ntg. Has f/u appt with Dr Johnsie Cancel on 5-23/30.  Mammo (-) 05-2016 PAP normal 06-2016    Objective:   Physical Exam  Constitutional: She appears  well-developed and well-nourished.  HENT:  Mouth/Throat: No oropharyngeal exudate.  Eyes: EOM are normal. Pupils are equal, round, and reactive to light.  Neck: Neck supple.  Cardiovascular: Normal rate, regular rhythm and normal heart sounds.   Pulmonary/Chest: Effort normal and breath sounds normal.  Abdominal: Soft. Bowel sounds are normal. There is no tenderness. There is no rebound.  Musculoskeletal: Normal range of motion.  Lymphadenopathy:    She has no cervical adenopathy.      Assessment & Plan:

## 2016-07-27 NOTE — Assessment & Plan Note (Signed)
States she has been treated.  Will rehcek her VL.  Again, now that she has completed tx, will work to get her off PI.

## 2016-07-27 NOTE — Assessment & Plan Note (Signed)
My appreciation to Dr Buddy Duty I am hopeful that in getting her off protease inhibitor we may be able to get her off statin?

## 2016-07-27 NOTE — Assessment & Plan Note (Signed)
Will hold on repeat u/s (F2)

## 2016-07-27 NOTE — Assessment & Plan Note (Signed)
My appreciation to Dr Buddy Duty for his close f/u.

## 2016-07-28 ENCOUNTER — Other Ambulatory Visit: Payer: Self-pay | Admitting: Pharmacist Clinician (PhC)/ Clinical Pharmacy Specialist

## 2016-07-28 MED ORDER — BICTEGRAVIR-EMTRICITAB-TENOFOV 50-200-25 MG PO TABS: 1.0000 | ORAL_TABLET | Freq: Every day | ORAL | 11 refills | Status: DC

## 2016-07-28 NOTE — Progress Notes (Signed)
Dr. Johnnye Sima asked if we could change her ART to [facility]. [facility] can fill but she would like to stay with [facility] in [place]. Rx has been forwarded up there. Counseled her on the new med and to stop her current regimen when she gets the [place].

## 2016-08-02 ENCOUNTER — Ambulatory Visit: Payer: Medicaid Other | Admitting: Physical Medicine & Rehabilitation

## 2016-08-24 ENCOUNTER — Other Ambulatory Visit: Payer: Self-pay | Admitting: *Deleted

## 2016-08-24 ENCOUNTER — Telehealth: Payer: Self-pay

## 2016-08-24 MED ORDER — BICTEGRAVIR-EMTRICITAB-TENOFOV 50-200-25 MG PO TABS: 1.0000 | ORAL_TABLET | Freq: Every day | ORAL | 5 refills | Status: DC

## 2016-08-24 NOTE — Telephone Encounter (Signed)
Needs to speak with a nurse about meds.  

## 2016-08-25 ENCOUNTER — Ambulatory Visit (HOSPITAL_BASED_OUTPATIENT_CLINIC_OR_DEPARTMENT_OTHER): Payer: Medicaid Other | Admitting: Physical Medicine & Rehabilitation

## 2016-08-25 ENCOUNTER — Encounter: Payer: Medicaid Other | Attending: Physical Medicine & Rehabilitation

## 2016-08-25 ENCOUNTER — Encounter: Payer: Self-pay | Admitting: Physical Medicine & Rehabilitation

## 2016-08-25 ENCOUNTER — Other Ambulatory Visit: Payer: Self-pay | Admitting: *Deleted

## 2016-08-25 VITALS — BP 131/85 | HR 81

## 2016-08-25 DIAGNOSIS — M47816 Spondylosis without myelopathy or radiculopathy, lumbar region: Secondary | ICD-10-CM | POA: Diagnosis present

## 2016-08-25 MED ORDER — ATORVASTATIN CALCIUM 10 MG PO TABS: 10.0000 mg | ORAL_TABLET | Freq: Every day | ORAL | 3 refills | Status: DC

## 2016-08-25 MED ORDER — MONTELUKAST SODIUM 10 MG PO TABS: 10.0000 mg | ORAL_TABLET | Freq: Every day | ORAL | 6 refills | Status: DC

## 2016-08-25 MED ORDER — PANTOPRAZOLE SODIUM 40 MG PO TBEC: 40.0000 mg | DELAYED_RELEASE_TABLET | Freq: Every day | ORAL | 6 refills | Status: DC

## 2016-08-25 MED ORDER — AMLODIPINE BESYLATE 5 MG PO TABS: 5.0000 mg | ORAL_TABLET | Freq: Every day | ORAL | 3 refills | Status: DC

## 2016-08-25 MED ORDER — CETIRIZINE HCL 10 MG PO TABS: ORAL_TABLET | ORAL | 3 refills | Status: DC

## 2016-08-25 MED ORDER — METOPROLOL TARTRATE 25 MG PO TABS: 25.0000 mg | ORAL_TABLET | Freq: Two times a day (BID) | ORAL | 5 refills | Status: DC

## 2016-08-25 MED ORDER — NITROGLYCERIN 0.4 MG SL SUBL: 0.4000 mg | SUBLINGUAL_TABLET | SUBLINGUAL | 1 refills | Status: DC | PRN

## 2016-08-25 MED ORDER — ALBUTEROL SULFATE HFA 108 (90 BASE) MCG/ACT IN AERS: INHALATION_SPRAY | RESPIRATORY_TRACT | 6 refills | Status: DC

## 2016-08-25 MED ORDER — VITAMIN D (CHOLECALCIFEROL) 25 MCG (1000 UT) PO CAPS: 1000.0000 mg | ORAL_CAPSULE | Freq: Every day | ORAL | 3 refills | Status: DC

## 2016-08-25 NOTE — Progress Notes (Signed)
  [facility]   Name: [name] [name] DOB:12-08-1960 MRN: [id number]  Date:08/25/2016  Physician: Alysia Penna, MD    Nurse/CMA: Adylynn Hertenstein CMA  Allergies:  Allergies  Allergen Reactions  . Acetaminophen Other (See Comments)    Inflamed liver, hospitalized REACTION: Had liver problems, was hospitalized  . Dyazide [Hydrochlorothiazide W-Triamterene] Hives  . Lisinopril     Cough   . Triamterene Hives  . Morphine Hives and Rash    REACTION: rash    Consent Signed: Yes.    Is patient diabetic? Yes.    CBG today? na  Pregnant: No. LMP: No LMP recorded. Patient is postmenopausal. (age 52-55)  Anticoagulants: no Anti-inflammatory: no Antibiotics: no  Procedure: R L3-5 MBB Position: Prone Start Time: 130pm   End Time: 135pm Fluoro Time:30s RN/CMA Jowel Waltner CMA Keyanah Kozicki CMA    Time 105pm 140pm    BP 131/85 155/98    Pulse 81 80    Respirations 16 16    O2 Sat 97 96    S/S 6 6    Pain Level 10/10 8/10     D/C home with brother, [name] & O X 3, D/C instructions reviewed, and sits independently.

## 2016-08-25 NOTE — Progress Notes (Signed)
Right lumbar L3, L4 medial branch blocks and L5 dorsal ramus injection under fluoroscopic guidance  Indication: Right Lumbar pain which is not relieved by medication management or other conservative care and interfering with self-care and mobility.  Informed consent was obtained after describing risks and benefits of the procedure with the patient, this includes bleeding, bruising, infection, paralysis and medication side effects. The patient wishes to proceed and has given written consent. The patient was placed in a prone position. The lumbar area was marked and prepped with Betadine. One ML of 1% lidocaine was injected into each of 3 areas into the skin and subcutaneous tissue. Then a 22-gauge 5in  spinal needle was inserted targeting the junction of the Right S1 superior articular process and sacral ala junction. Needle was advanced under fluoroscopic guidance. Bone contact was made.Isovue 200 was injected x0.5 mL demonstrating no intravascular uptake. Then a solution containing 2% MPF lidocaine was injected x0.5 mL. Then the Right L5 superior articular process in transverse process junction was targeted. Bone contact was made.Isovue 200 was injected x0.5 mL demonstrating no intravascular uptake. Then a solution containing 2% MPF lidocaine was injected x0.5 mL. Then the Right L4 superior articular process in transverse process junction was targeted. Bone contact was made. Isovue 200 was injected x0.5 mL demonstrating no intravascular uptake. Then a solution containing2% MPF lidocaine was injected x0.5 mL Patient tolerated procedure well. Post procedure instructions were given. Please refer to post procedure form. 
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
## 2016-08-25 NOTE — Patient Instructions (Signed)
Lumbar medial branch blocks were performed. This is to help diagnose the cause of the low back pain. It is important that you keep track of your pain for the first day or 2 after injection. This injection can give you temporary relief that lasts for hours or up to several months. There is no way to predict duration of pain relief.  Please try to compare your pain after injection to for the injection.  If this injection gives you  temporary relief there may be another longer-lasting procedure that may be beneficial called radiofrequency ablation 

## 2016-08-29 ENCOUNTER — Encounter: Payer: Self-pay | Admitting: Pulmonary Disease
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
## 2016-08-29 NOTE — Progress Notes (Signed)
Pap smear normal. Results sent to patient.

## 2016-08-31 ENCOUNTER — Encounter: Payer: Self-pay | Admitting: Cardiovascular Disease

## 2016-09-20 NOTE — Progress Notes (Signed)
[name], female   DOB: 06-Aug-1960, 56 y.o.   MRN: [id number]     Cardiology Office Note   Date:  09/22/2016   ID:  [name] [name], DOB [birth year], MRN [id number]  PCP:  Milagros Loll, MD  Cardiologist:   Jenkins Rouge, MD   Chief Complaint  Patient presents with  . some chest pain, none today, per pt      History of Present Illness: [name] [name] is a 56 y.o. female who presents for fu  of atypical chest pain.  History of adrenal insufficiency, HIV, Hepatitis C , pituitary microadenoma and pancreatitis.  She is a Acupuncturist witness and will not use blood products Pain since diagnosed with addisons.  Exertional usually.  Not positional sometimes pleuritic  No rest pain  Pain in chest started with all the cramping pain she had during diagnosis of Addison's that was worse in abdomen and legs.  Noted some LE edema since started on steroids.  On norvasc for years   CRF: HTN and elevated lipids  Studies Reviewed  Had normal stress echo in 2013  Normal ABI's done for leg cramps 07/25/14   Normal LLE Venous Duplex 05/27/14 Stress Echo 09/2014 Normal   Seen in ER 2/5  URI nausea atypical chest pain not related to heart URI again March seen by primary and had course of zithromycin and augmentin   Right lumbar pain had L3-5 nerve blocks 08/25/16  Still with some sharp SSCP. Sometimes exertional sometimes not Not positional or pleuritic can get weekly nothing makes it better    Past Medical History:  Diagnosis Date  . Allergic rhinitis 05/09/2006  . Allergy   . Anxiety   . Arthritis   . Asthma   . CHF (congestive heart failure) ([facility])   . Chronic back pain   . Diabetes mellitus without complication ([facility])   . GERD (gastroesophageal reflux disease)   . Heart murmur    as a child  . 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 s/p 12 week course of simeprevir and sofosbuvir between October 2014 and January 2015 with resolution.  Marland Kitchen History of  shingles   . HIV infection ([place])    1994  . Hyperlipidemia    no meds taken now  . Hypertension   . Pituitary microadenoma ([place]) 08/08/2014  . Prediabetes   . Refusal of blood transfusions as patient is Jehovah's Witness   . Secondary adrenal insufficiency ([facility]) 06/29/2014    Past Surgical History:  Procedure Laterality Date  . BUNIONECTOMY     b/l  . COLECTOMY     2003 for diverticulitis, had colostomy bag and then reversed  . COLONOSCOPY    . HAND SURGERY    . NASAL SINUS SURGERY    . SHOULDER SURGERY     left  . TONSILLECTOMY       Current Outpatient Prescriptions  Medication Sig Dispense Refill  . albuterol (PROAIR HFA) 108 (90 Base) MCG/ACT inhaler INHALE 1-2 PUFFS BY MOUTH INTO THE LUNGS EVERY 6 HOURS AS NEEDED FOR WHEEEZING AND SHORTNESS OF BREATH 8.5 each 6  . amLODipine (NORVASC) 5 MG tablet Take 1 tablet (5 mg total) by mouth daily. 90 tablet 3  . atorvastatin (LIPITOR) 10 MG tablet Take 1 tablet (10 mg total) by mouth daily. 90 tablet 3  . bictegravir-emtricitabine-tenofovir AF (BIKTARVY) 50-200-25 MG TABS tablet Take 1 tablet by mouth daily. 30 tablet 5  .  cetirizine (ZYRTEC) 10 MG tablet TAKE 1 TABLET BY MOUTH EVERY DAY AS NEEDED FOR ALLERGIES 90 tablet 3  . chlorhexidine (PERIDEX) 0.12 % solution Use as directed 15 mLs in the mouth or throat 2 (two) times daily.    . clotrimazole (LOTRIMIN) 1 % cream Apply 1 application topically 2 (two) times daily. 30 g 0  . diazepam (VALIUM) 5 MG tablet TAKE 1 TABLET BY MOUTH EVERY NIGHT AT BEDTIME AS NEEDED FOR ANXIETY (MUST LAST 30 DAYS) 20 tablet 1  . DIPHENHIST 25 MG tablet TAKE 1-2 TABLETS BY MOUTH EVERY NIGHT AT BEDTIME AS NEEDED FOR ITCHING. 20 tablet 3  . guaiFENesin-codeine 100-10 MG/5ML syrup Take 10 mLs by mouth 3 (three) times daily as needed for cough. 120 mL 0  . hydrocortisone (CORTEF) 10 MG tablet TAKE 1 TABLET BY MOUTH IN THE AM, 1/2 TAB BY MOUTH @ 11 AM & 1/2 TAB BY MOUTH @ 3 PM     . metoprolol tartrate  (LOPRESSOR) 25 MG tablet Take 1 tablet (25 mg total) by mouth 2 (two) times daily. 60 tablet 5  . montelukast (SINGULAIR) 10 MG tablet Take 1 tablet (10 mg total) by mouth daily. 30 tablet 6  . nitroGLYCERIN (NITROSTAT) 0.4 MG SL tablet Place 1 tablet (0.4 mg total) under the tongue every 5 (five) minutes as needed for chest pain. 25 tablet 1  . Olopatadine HCl (PATADAY) 0.2 % SOLN Apply 1 drop to eye daily as needed. For allergies     . pantoprazole (PROTONIX) 40 MG tablet Take 1 tablet (40 mg total) by mouth daily. 30 tablet 6  . promethazine (PHENERGAN) 25 MG tablet Take 1 tablet (25 mg total) by mouth every 6 (six) hours as needed for nausea or vomiting. 30 tablet 0  . pseudoephedrine (SUDAFED) 30 MG tablet Take 1 tablet (30 mg total) by mouth every 6 (six) hours as needed for congestion. 24 tablet 0  . sodium chloride (OCEAN) 0.65 % SOLN nasal spray Place 1 spray into both nostrils as needed for congestion. 1 Bottle 0  . Vitamin D, Cholecalciferol, 1000 units CAPS Take 1,000 mg by mouth daily. 90 capsule 3   No current facility-administered medications for this visit.     Allergies:   Acetaminophen; Dyazide [hydrochlorothiazide w-triamterene]; Lisinopril; Triamterene; and Morphine    Social History:  The patient  reports that she quit smoking about 9 years ago. Her smoking use included Cigarettes. She quit after 20.00 years of use. She has never used smokeless tobacco. She reports that she does not drink alcohol or use drugs.   Family History:  The patient's family history includes Cancer in her maternal aunt, maternal aunt, and sister; Colon polyps in her brother; Diabetes in her father and mother; Heart disease in her father and mother; Hepatitis in her sister; Stroke in her father, mother, and other.    ROS:  Please see the history of present illness.   Otherwise, review of systems are positive for none.   All other systems are reviewed and negative.    PHYSICAL EXAM: VS:  BP 140/60    Pulse 88   Ht 5\' 1"  (1.549 m)   Wt 176 lb (79.8 kg)   SpO2 97%   BMI 33.25 kg/m  , BMI Body mass index is 33.25 kg/m. Affect appropriate Obese black female   Kyphosis  HEENT: normal Neck supple with no adenopathy JVP normal no bruits no thyromegaly Lungs clear with no wheezing and good diaphragmatic motion Heart:  S1/S2  no murmur, no rub, gallop or click PMI normal Abdomen: benighn, BS positve, no tenderness, no AAA no bruit.  No HSM or HJR Distal pulses intact with no bruits No edema Neuro non-focal Skin warm and dry No muscular weakness     EKG:  08/18/14  SR rate 95  Normal ECG  05/31/15  SR rate 93 nonspecific ST changes    Recent Labs: 07/12/2016: ALT 14; BUN 8; Creat 0.86; Hemoglobin 12.9; Platelets 265; Potassium 3.8; Sodium 139    Lipid Panel    Component Value Date/Time   CHOL 155 07/12/2016 1137   TRIG 219 (H) 07/12/2016 1137   HDL 41 (L) 07/12/2016 1137   CHOLHDL 3.8 07/12/2016 1137   VLDL 44 (H) 07/12/2016 1137   LDLCALC 70 07/12/2016 1137      Wt Readings from Last 3 Encounters:  09/22/16 176 lb (79.8 kg)  07/27/16 172 lb (78 kg)  07/26/16 175 lb (79.4 kg)      Other studies Reviewed: Additional studies/ records that were reviewed today include: Hospital records in [place] .    ASSESSMENT AND PLAN:  1.  Chest Pain:  Normal stress echo June 2016   Recent episode in ER ? Related to URI Unable to walk on treadmill due to back pain f/u myovue with lexiscan  2. Addisons  Discussed stress does steroids  F/u endocrine Currently on stress dose from cold 3. HTN;  Well controlled.  Continue current medications and low sodium Dash type diet.   4. DM:  Discussed low carb diet.  Target hemoglobin A1c is 6.5 or less.  Continue current medications. 5. Lumbar Pain:  Post injection with some improvement f/u rehab 6. HIV:  F/u Dr Johnnye Sima biktarvy started HIV 1 RNA quant ordered by ID  7. Hepatitis C:  Post RX titers ordered by ID   Current medicines are  reviewed at length with the patient today.  The patient does not have concerns regarding medicines.  The following changes have been made:  SL nitro called in   Labs/ tests ordered today include:    Orders Placed This Encounter  Procedures  . Myocardial Perfusion Imaging     Disposition:   FU with me in a year     Signed, Jenkins Rouge, MD  09/22/2016 2:27 PM    [facility] [place], [place], [place]  [address] Phone: [phone]; Fax: [phone]

## 2016-09-22 ENCOUNTER — Encounter: Payer: Self-pay | Admitting: Cardiovascular Disease

## 2016-09-22 ENCOUNTER — Ambulatory Visit (INDEPENDENT_AMBULATORY_CARE_PROVIDER_SITE_OTHER): Payer: Medicaid Other | Admitting: Cardiovascular Disease

## 2016-09-22 VITALS — BP 140/60 | HR 88 | Ht 61.0 in | Wt 176.0 lb

## 2016-09-22 DIAGNOSIS — R0789 Other chest pain: Secondary | ICD-10-CM | POA: Diagnosis not present

## 2016-09-22 NOTE — Patient Instructions (Signed)
Medication Instructions:  Your physician recommends that you continue on your current medications as directed. Please refer to the Current Medication list given to you today.  Labwork: NONE  Testing/Procedures: Your physician has requested that you have a lexiscan myoview. For further information please visit [email or web address]. Please follow instruction sheet, as given.  Follow-Up: Your physician wants you to follow-up in: 12 months with Dr. Nishan. You will receive a reminder letter in the mail two months in advance. If you don't receive a letter, please call our office to schedule the follow-up appointment.   If you need a refill on your cardiac medications before your next appointment, please call your pharmacy.    

## 2016-09-23 ENCOUNTER — Encounter: Payer: Self-pay | Admitting: Physical Medicine & Rehabilitation

## 2016-09-23 ENCOUNTER — Ambulatory Visit (HOSPITAL_BASED_OUTPATIENT_CLINIC_OR_DEPARTMENT_OTHER): Payer: Medicaid Other | Admitting: Physical Medicine & Rehabilitation

## 2016-09-23 ENCOUNTER — Encounter: Payer: Medicaid Other | Attending: Physical Medicine & Rehabilitation

## 2016-09-23 VITALS — BP 130/82 | HR 84

## 2016-09-23 DIAGNOSIS — M47816 Spondylosis without myelopathy or radiculopathy, lumbar region: Secondary | ICD-10-CM

## 2016-09-23 NOTE — Progress Notes (Signed)
  [facility]   Name: [name] [name] DOB:Aug 06, 1960 MRN: [id number]  Date:09/23/2016  Physician: Alysia Penna, MD    Nurse/CMA: Wessling CMA  Allergies:  Allergies  Allergen Reactions  . Acetaminophen Other (See Comments)    Inflamed liver, hospitalized REACTION: Had liver problems, was hospitalized  . Dyazide [Hydrochlorothiazide W-Triamterene] Hives  . Lisinopril     Cough   . Triamterene Hives  . Morphine Hives and Rash    REACTION: rash    Consent Signed: Yes.    Is patient diabetic? Yes.    CBG today? Does not check, hgA1c is lower now  Pregnant: No. LMP: No LMP recorded. Patient is postmenopausal. (age 55-55)  Anticoagulants: no Anti-inflammatory: no Antibiotics: no  Procedure: right lumbar 3-4-5 radiofrequency neurotomy Position: Prone Start Time: 12:55pm    End Time:  1:18pm       Fluoro Time: 50  RN/CMA Sports administrator CMA    Time 12:15 1:24pm    BP 130/82 169/84    Pulse 84 84    Respirations 14 14    O2 Sat 93 95    S/S 6 6    Pain Level 9/10 8/10     D/C home with sister in law [place], [name] & O X 3, D/C instructions reviewed, and sits independently.

## 2016-09-23 NOTE — Patient Instructions (Signed)
You had a radio frequency procedure today This was done to alleviate joint pain in your lumbar area We injected a combination of dexamethasone which is a steroid as well as lidocaine which is a local anesthetic. Dexamethasone made increased blood sugars you are diabetic You may experience soreness at the injection sites. You may also experienced some irritation of the nerves that were heated I'm recommending ice for 30 minutes every 2 hours as needed for the next 24-48 hours In addition he will be taking gabapentin 600 mg 3 times a day today only if this is not one of your usual medicines  

## 2016-09-23 NOTE — Progress Notes (Signed)
RightL5 dorsal ramus., Right L4 and Right L3 medial branch radio frequency neurotomy under fluoroscopic guidance   Indication: Low back pain due to lumbar spondylosis which has been relieved on 2 occasions by greater than 50% by lumbar medial branch blocks at corresponding levels.  Informed consent was obtained after describing risks and benefits of the procedure with the patient, this includes bleeding, bruising, infection, paralysis and medication side effects. The patient wishes to proceed and has given written consent. The patient was placed in a prone position. The lumbar and sacral area was marked and prepped with Betadine. A 25-gauge 1-1/2 inch needle was inserted into the skin and subcutaneous tissue at 3 sites in one ML of 1% lidocaine was injected into each site. Then a 18-gauge 15 cm radio frequency needle with a 1 cm curved active tip was inserted targeting the Right S1 SAP/sacral ala junction. Bone contact was made and confirmed with lateral imaging.  motor stimulation at 2 Hz confirm proper needle location followed by injection of 46m 2% MPF lidocaine. Then the Right L5 SAP/transverse process junction was targeted. Bone contact was made and confirmed with lateral imaging.  motor stimulation at 2 Hz confirm proper needle location followed by injection of 145m2% MPF lidocaine. Then the Right L4 SAP/transverse process junction was targeted. Bone contact was made and confirmed with lateral imaging. motor stimulation at 2 Hz confirm proper needle location followed by injection of 63m9m% MPF lidocaine. Radio frequency lesion being [facility] 90 seconds was performed. Needles were removed. Post procedure instructions and vital signs were performed. Patient tolerated procedure well. Followup appointment was given.

## 2016-09-29 ENCOUNTER — Telehealth (HOSPITAL_COMMUNITY): Payer: Self-pay | Admitting: *Deleted

## 2016-09-29 NOTE — Telephone Encounter (Signed)
Left message on voicemail per DPR in reference to upcoming appointment scheduled on 10/05/16 at 10:00 with detailed instructions given per Myocardial Perfusion Study Information Sheet for the test. LM to arrive 15 minutes early, and that it is imperative to arrive on time for appointment to keep from having the test rescheduled. If you need to cancel or reschedule your appointment, please call the office within 24 hours of your appointment. Failure to do so may result in a cancellation of your appointment, and a $50 no show fee. Phone number given for call back for any questions. Glade Lloyd

## 2016-10-05 ENCOUNTER — Ambulatory Visit (HOSPITAL_COMMUNITY): Payer: Medicaid Other | Attending: Cardiology

## 2016-10-05 DIAGNOSIS — R0789 Other chest pain: Secondary | ICD-10-CM | POA: Diagnosis not present

## 2016-10-05 MED ORDER — REGADENOSON 0.4 MG/5ML IV SOLN
0.4000 mg | Freq: Once | INTRAVENOUS | Status: AC
  Administered 2016-10-05: 0.4 mg via INTRAVENOUS

## 2016-10-05 MED ORDER — TECHNETIUM TC 99M TETROFOSMIN IV KIT
33.0000 | PACK | Freq: Once | INTRAVENOUS | Status: AC | PRN
  Administered 2016-10-05: 33 via INTRAVENOUS
  Filled 2016-10-05: qty 33

## 2016-10-06 ENCOUNTER — Encounter: Payer: Self-pay | Admitting: Pulmonary Disease

## 2016-10-06 ENCOUNTER — Encounter: Payer: Self-pay | Admitting: *Deleted

## 2016-10-06 ENCOUNTER — Ambulatory Visit (INDEPENDENT_AMBULATORY_CARE_PROVIDER_SITE_OTHER): Payer: Medicaid Other | Admitting: Pulmonary Disease

## 2016-10-06 VITALS — BP 159/77 | HR 78 | Temp 98.6°F | Ht 61.0 in | Wt 178.3 lb

## 2016-10-06 DIAGNOSIS — Z87891 Personal history of nicotine dependence: Secondary | ICD-10-CM

## 2016-10-06 DIAGNOSIS — Z8619 Personal history of other infectious and parasitic diseases: Secondary | ICD-10-CM

## 2016-10-06 DIAGNOSIS — M549 Dorsalgia, unspecified: Secondary | ICD-10-CM

## 2016-10-06 DIAGNOSIS — M67442 Ganglion, left hand: Secondary | ICD-10-CM | POA: Diagnosis not present

## 2016-10-06 DIAGNOSIS — E2740 Unspecified adrenocortical insufficiency: Secondary | ICD-10-CM | POA: Diagnosis not present

## 2016-10-06 DIAGNOSIS — R7303 Prediabetes: Secondary | ICD-10-CM

## 2016-10-06 DIAGNOSIS — G8929 Other chronic pain: Secondary | ICD-10-CM | POA: Diagnosis not present

## 2016-10-06 DIAGNOSIS — Z79899 Other long term (current) drug therapy: Secondary | ICD-10-CM | POA: Diagnosis not present

## 2016-10-06 DIAGNOSIS — K740 Hepatic fibrosis: Secondary | ICD-10-CM

## 2016-10-06 DIAGNOSIS — M6740 Ganglion, unspecified site: Secondary | ICD-10-CM

## 2016-10-06 DIAGNOSIS — I10 Essential (primary) hypertension: Secondary | ICD-10-CM | POA: Diagnosis not present

## 2016-10-06 DIAGNOSIS — K219 Gastro-esophageal reflux disease without esophagitis: Secondary | ICD-10-CM

## 2016-10-06 DIAGNOSIS — Z21 Asymptomatic human immunodeficiency virus [HIV] infection status: Secondary | ICD-10-CM

## 2016-10-06 MED ORDER — AMLODIPINE BESYLATE 10 MG PO TABS: 10.0000 mg | ORAL_TABLET | Freq: Every day | ORAL | Status: DC

## 2016-10-06 MED ORDER — PANTOPRAZOLE SODIUM 40 MG PO TBEC: 40.0000 mg | DELAYED_RELEASE_TABLET | Freq: Two times a day (BID) | ORAL | 0 refills | Status: DC

## 2016-10-06 MED ORDER — AMLODIPINE BESYLATE 10 MG PO TABS: 10.0000 mg | ORAL_TABLET | Freq: Every day | ORAL | 0 refills | Status: DC

## 2016-10-06 NOTE — Patient Instructions (Signed)
We will refer you to a hand surgeon for your cyst Start taking two tablets (10mg ) of your amlodipine daily until you run out. Follow up in 4-6 weeks

## 2016-10-06 NOTE — Assessment & Plan Note (Addendum)
GERD uncontrolled while back on 40mg  daily of pantoprazole. Will increase her to BID for the next month and encouraged her to keep a food diary.

## 2016-10-06 NOTE — Assessment & Plan Note (Signed)
BP uncontrolled 159/77. Will increase her amlodipine from 5mg  to 10mg  daily. Continue metoprolol 25mg  BID.

## 2016-10-06 NOTE — Assessment & Plan Note (Signed)
1cm round firm lesion overlying tendon of left palmar surface. History and exam concerning for ganglion cyst. It is bothering her and she would like to have it removed. Will refer her to hand surgery for evaluation.

## 2016-10-06 NOTE — Progress Notes (Signed)
Amlodipine and Protonix rxs called to [facility].

## 2016-10-06 NOTE — Progress Notes (Signed)
   CC: left hand "knot"  HPI:  [name] is a 56 year old woman with HIV, treated hepatitis C, liver fibrosis, hypertension, GERD, adrenal insufficiency, prediabetes, chronic back pain presenting for evaluation of left hand "knot"  She first noticed it on the palmar surface of her left hand about a month ago. It started out as pea sized and has grown. It causes intermittent pain. She is right handed. She has some subjective decreased hand strength. No overlying skin changes.  Past Medical History:  Diagnosis Date  . Allergic rhinitis 05/09/2006  . Allergy   . Anxiety   . Arthritis   . Asthma   . CHF (congestive heart failure) ([place])   . Chronic back pain   . Diabetes mellitus without complication ([place] [facility])   . GERD (gastroesophageal reflux disease)   . Heart murmur    as a child  . 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 s/p 12 week course of simeprevir and sofosbuvir between October 2014 and January 2015 with resolution.  Marland Kitchen History of shingles   . HIV infection ([place])    1994  . Hyperlipidemia    no meds taken now  . Hypertension   . Pituitary microadenoma ([facility]) 08/08/2014  . Prediabetes   . Refusal of blood transfusions as patient is Jehovah's Witness   . Secondary adrenal insufficiency ([place]) 06/29/2014    Review of Systems:   +DOE No chest pain at rest No fevers or chills  Physical Exam:  Vitals:   10/06/16 1420 10/06/16 1447  BP: (!) 157/81 (!) 159/77  Pulse: 78   Temp: 98.6 F (37 C)   TempSrc: Oral   SpO2: 98%   Weight: 178 lb 4.8 oz (80.9 kg)   Height: 5\' 1"  (1.549 m)    General Apperance: NAD HEENT: Normocephalic, atraumatic, anicteric sclera Neck: Supple, trachea midline Lungs: Clear to auscultation bilaterally. No wheezes, rhonchi or rales. Breathing comfortably Heart: Regular rate and rhythm, no murmur/rub/gallop Abdomen: Soft, nontender, nondistended, no rebound/guarding Extremities: Warm and well perfused, no  edema Skin: left palmar surface with 1cm round firm lesion without overlying skin changes, appears to be overlying a tendon.  Neurologic: Alert and interactive. No gross deficits.  Assessment & Plan:   See Encounters Tab for problem based charting.  Patient discussed with Dr. Beryle Beams

## 2016-10-07 NOTE — Progress Notes (Signed)
Medicine attending: Medical history, presenting problems, physical findings, and medications, reviewed with resident physician Dr Jennifer Krall on the day of the patient visit and I concur with her evaluation and management plan. 

## 2016-10-10 ENCOUNTER — Ambulatory Visit (HOSPITAL_COMMUNITY): Payer: Medicaid Other | Attending: Cardiology

## 2016-10-10 LAB — MYOCARDIAL PERFUSION IMAGING
CHL CUP NUCLEAR SDS: 0
CHL CUP NUCLEAR SRS: 0
CHL CUP NUCLEAR SSS: 0
LV dias vol: 81 mL (ref 46–106)
LVSYSVOL: 30 mL
Peak HR: 101 {beats}/min
RATE: 0.27
Rest HR: 75 {beats}/min
TID: 0.85

## 2016-10-10 MED ORDER — TECHNETIUM TC 99M TETROFOSMIN IV KIT
30.5000 | PACK | Freq: Once | INTRAVENOUS | Status: AC | PRN
  Administered 2016-10-10: 30.5 via INTRAVENOUS
  Filled 2016-10-10: qty 31

## 2016-10-19 ENCOUNTER — Ambulatory Visit (INDEPENDENT_AMBULATORY_CARE_PROVIDER_SITE_OTHER): Payer: Self-pay | Admitting: Orthopedic Surgery

## 2016-10-24 ENCOUNTER — Encounter: Payer: Self-pay | Admitting: *Deleted

## 2016-10-24 LAB — HM DIABETES EYE EXAM

## 2016-10-27 ENCOUNTER — Encounter (INDEPENDENT_AMBULATORY_CARE_PROVIDER_SITE_OTHER): Payer: Self-pay | Admitting: Orthopedic Surgery

## 2016-10-27 ENCOUNTER — Ambulatory Visit (INDEPENDENT_AMBULATORY_CARE_PROVIDER_SITE_OTHER): Payer: Medicaid Other

## 2016-10-27 ENCOUNTER — Ambulatory Visit (INDEPENDENT_AMBULATORY_CARE_PROVIDER_SITE_OTHER): Payer: Medicaid Other | Admitting: Orthopedic Surgery

## 2016-10-27 DIAGNOSIS — M79642 Pain in left hand: Secondary | ICD-10-CM | POA: Insufficient documentation

## 2016-10-27 NOTE — Progress Notes (Signed)
Office Visit Note   Patient: [name] [name]           Date of Birth: [birth year]           MRN: [id number] Visit Date: 10/27/2016 Requested by: Milagros Loll, MD No address on file PCP: Colbert Ewing, MD  Subjective: Chief Complaint  Patient presents with  . Left Hand - Pain    HPI: [name] [name] is a 56 year old patient with a mass in the palm of her left hand.  Started as a small pea-sized area 6 months ago which she states is getting larger.  It is painful when she grips items.  Denies any discrete numbness in the ring finger.  Years ago she had left hand surgery but that was affecting primarily digits one 2 and 3.  Did not have any palmar incisions at that time.  She takes Advil for symptoms.              ROS: All systems reviewed are negative as they relate to the chief complaint within the history of present illness.  Patient denies  fevers or chills.   Assessment & Plan: Visit Diagnoses:  1. Left hand pain     Plan: Impression is cystic structure likely tendon sheath cyst in the palm of the left hand.  This is examined under ultrasound does have a cystic quality.  It is painful for the patient.  This does not look like a Dupuytren's contracture.  Plan is excision of the cyst.  Could be a ganglion cyst of the tendon sheath or it could be an epidermal inclusion cyst.  Risks and benefits are discussed including not limited to infection nerve and vessel damage as well as possible recurrence.  Patient understands the risks and benefits.  All questions answered.  Follow-Up Instructions: No Follow-up on file.   Orders:  Orders Placed This Encounter  Procedures  . XR Hand Complete Left   No orders of the defined types were placed in this encounter.     Procedures: No procedures performed   Clinical Data: No additional findings.  Objective: Vital Signs: There were no vitals taken for this visit.  Physical Exam:   Constitutional: Patient appears well-developed HEENT:    Head: Normocephalic Eyes:EOM are normal Neck: Normal range of motion Cardiovascular: Normal rate Pulmonary/chest: Effort normal Neurologic: Patient is alert Skin: Skin is warm Psychiatric: Patient has normal mood and affect    Ortho Exam: Orthopedic examination of the left hand demonstrates good grip EPL FPL interosseous wrist flexion and wrist extension strength.  Radial pulses intact.  She does have a mass about 9 x 9 mm which is between the proximal and distal flexion creases of the palm.  This is in line with the fourth finger it is examined under ultrasound and has cystic quality.  Does not move with the tendon.  It is palmar to the neurovascular bundle.  Negative Tinel's in this area.  Specialty Comments:  No specialty comments available.  Imaging: Xr Hand Complete Left  Result Date: 10/27/2016 AP lateral oblique left hand reviewed.  No fracture noted.  No calcification of the soft tissues in the palm.  Mild degenerative changes in the radiocarpal and intercarpal region.    PMFS History: Patient Active Problem List   Diagnosis Date Noted  . Left hand pain 10/27/2016  . Ganglion cyst 10/06/2016  . 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 BPPV (benign paroxysmal positional vertigo) 10/02/2015  . Liver fibrosis ([place])  12/04/2014  . Pituitary microadenoma ([place]) 08/08/2014  . Prediabetes 08/05/2014  . Vitamin D deficiency 06/29/2014  . Secondary adrenal insufficiency ([place]) 06/29/2014  . History of tympanostomy tube placement 02/07/2014  . Refusal of blood transfusions as patient is Jehovah's Witness 11/18/2013  . Chronic hepatitis C without hepatic coma ([place]) 03/16/2012  . Postherpetic neuralgia 12/15/2011  . Health care maintenance 12/15/2011  . Chest pain 05/31/2011  . Fatigue 05/18/2011  . Opioid dependence ([place]) 10/05/2010  . HTN (hypertension) 10/05/2010  . Hyperlipidemia associated with type 2 diabetes mellitus ([place]) 10/23/2008   . INSOMNIA 02/14/2007  . HIV disease ([place]) 05/09/2006  . Depression 05/09/2006  . Allergic rhinitis 05/09/2006  . GERD 05/09/2006   Past Medical History:  Diagnosis Date  . Allergic rhinitis 05/09/2006  . Allergy   . Anxiety   . Arthritis   . Asthma   . CHF (congestive heart failure) ([facility])   . Chronic back pain   . Diabetes mellitus without complication ([place])   . GERD (gastroesophageal reflux disease)   . Heart murmur    as a child  . 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 s/p 12 week course of simeprevir and sofosbuvir between October 2014 and January 2015 with resolution.  Marland Kitchen History of shingles   . HIV infection ([place])    1994  . Hyperlipidemia    no meds taken now  . Hypertension   . Pituitary microadenoma ([place]) 08/08/2014  . Prediabetes   . Refusal of blood transfusions as patient is Jehovah's Witness   . Secondary adrenal insufficiency ([place]) 06/29/2014    Family History  Problem Relation Age of Onset  . Heart disease Mother   . Diabetes Mother   . Stroke Mother   . Heart disease Father   . Stroke Father   . Diabetes Father   . Hepatitis Sister        hcv  . Stroke Other   . Colon polyps Brother   . Cancer Sister        lung  . Cancer Maternal Aunt   . Cancer Maternal Aunt   . Colon cancer Neg Hx   . Esophageal cancer Neg Hx   . Stomach cancer Neg Hx   . Rectal cancer Neg Hx     Past Surgical History:  Procedure Laterality Date  . BUNIONECTOMY     b/l  . COLECTOMY     2003 for diverticulitis, had colostomy bag and then reversed  . COLONOSCOPY    . HAND SURGERY    . NASAL SINUS SURGERY    . SHOULDER SURGERY     left  . TONSILLECTOMY     Social History   Occupational History  . Not on file.   Social History Main Topics  . Smoking status: Former Smoker    Years: 20.00    Types: Cigarettes    Quit date: 04/26/2007  . Smokeless tobacco: Never Used     Comment: QUIT 2009  . Alcohol use No  . Drug use: No  .  Sexual activity: Not on file     Comment: pt. given condoms

## 2016-11-07 DIAGNOSIS — M71342 Other bursal cyst, left hand: Secondary | ICD-10-CM | POA: Diagnosis not present

## 2016-11-16 ENCOUNTER — Ambulatory Visit (INDEPENDENT_AMBULATORY_CARE_PROVIDER_SITE_OTHER): Payer: Medicaid Other | Admitting: Orthopedic Surgery

## 2016-11-16 DIAGNOSIS — M79642 Pain in left hand: Secondary | ICD-10-CM

## 2016-11-18 NOTE — Progress Notes (Signed)
Post-Op Visit Note   Patient: [name] [name]           Date of Birth: 1960-12-26           MRN: [id number] Visit Date: 11/16/2016 PCP: Colbert Ewing, MD   Assessment & Plan:  Chief Complaint:  Chief Complaint  Patient presents with  . Left Hand - Routine Post Op   Visit Diagnoses:  1. Left hand pain     Plan: [name] [name] is a 56 year old patient with left hand pain following cyst removal a week ago.  On exam incision is intact.  Pathology on the cyst was negative.  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  Plan at this time is to remove the sutures.  Come back in 4 weeks for clinical recheck.  I wouldn't do any stretching or resistive work with that hand for at least 2 weeks.  Follow-Up Instructions: Return in about 4 weeks (around 12/14/2016).   Orders:  No orders of the defined types were placed in this encounter.  No orders of the defined types were placed in this encounter.   Imaging: No results found.  PMFS History: Patient Active Problem List   Diagnosis Date Noted  . Left hand pain 10/27/2016  . Ganglion cyst 10/06/2016  . 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 BPPV (benign paroxysmal positional vertigo) 10/02/2015  . Liver fibrosis ([place]) 12/04/2014  . Pituitary microadenoma ([place]) 08/08/2014  . Prediabetes 08/05/2014  . Vitamin D deficiency 06/29/2014  . Secondary adrenal insufficiency ([place]) 06/29/2014  . History of tympanostomy tube placement 02/07/2014  . Refusal of blood transfusions as patient is Jehovah's Witness 11/18/2013  . Chronic hepatitis C without hepatic coma ([place]) 03/16/2012  . Postherpetic neuralgia 12/15/2011  . Health care maintenance 12/15/2011  . Chest pain 05/31/2011  . Fatigue 05/18/2011  . Opioid dependence ([place]) 10/05/2010  . HTN (hypertension) 10/05/2010  . Hyperlipidemia associated with type 2 diabetes mellitus ([place]) 10/23/2008  . INSOMNIA 02/14/2007  . HIV disease ([place]) 05/09/2006  . Depression  05/09/2006  . Allergic rhinitis 05/09/2006  . GERD 05/09/2006   Past Medical History:  Diagnosis Date  . Allergic rhinitis 05/09/2006  . Allergy   . Anxiety   . Arthritis   . Asthma   . CHF (congestive heart failure) ([place])   . Chronic back pain   . Diabetes mellitus without complication ([place])   . GERD (gastroesophageal reflux disease)   . Heart murmur    as a child  . 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 s/p 12 week course of simeprevir and sofosbuvir between October 2014 and January 2015 with resolution.  Marland Kitchen History of shingles   . HIV infection ([place])    1994  . Hyperlipidemia    no meds taken now  . Hypertension   . Pituitary microadenoma ([place]) 08/08/2014  . Prediabetes   . Refusal of blood transfusions as patient is Jehovah's Witness   . Secondary adrenal insufficiency ([place]) 06/29/2014    Family History  Problem Relation Age of Onset  . Heart disease Mother   . Diabetes Mother   . Stroke Mother   . Heart disease Father   . Stroke Father   . Diabetes Father   . Hepatitis Sister        hcv  . Stroke Other   . Colon polyps Brother   . Cancer Sister        lung  . Cancer  Maternal Aunt   . Cancer Maternal Aunt   . Colon cancer Neg Hx   . Esophageal cancer Neg Hx   . Stomach cancer Neg Hx   . Rectal cancer Neg Hx     Past Surgical History:  Procedure Laterality Date  . BUNIONECTOMY     b/l  . COLECTOMY     2003 for diverticulitis, had colostomy bag and then reversed  . COLONOSCOPY    . HAND SURGERY    . NASAL SINUS SURGERY    . SHOULDER SURGERY     left  . TONSILLECTOMY     Social History   Occupational History  . Not on file.   Social History Main Topics  . Smoking status: Former Smoker    Years: 20.00    Types: Cigarettes    Quit date: 04/26/2007  . Smokeless tobacco: Never Used     Comment: QUIT 2009  . Alcohol use No  . Drug use: No  . Sexual activity: Not on file     Comment: pt. given condoms

## 2016-11-29 ENCOUNTER — Other Ambulatory Visit (INDEPENDENT_AMBULATORY_CARE_PROVIDER_SITE_OTHER): Payer: Self-pay | Admitting: Orthopedic Surgery

## 2016-11-29 NOTE — Telephone Encounter (Signed)
y

## 2016-11-29 NOTE — Telephone Encounter (Signed)
Ok to rf? 

## 2016-12-07 ENCOUNTER — Other Ambulatory Visit: Payer: Self-pay

## 2016-12-07 MED ORDER — AMLODIPINE BESYLATE 10 MG PO TABS: 10.0000 mg | ORAL_TABLET | Freq: Every day | ORAL | 2 refills | Status: DC

## 2016-12-07 NOTE — Telephone Encounter (Signed)
Huang no appts until Nov. Med was increased June for uncontrolled HTN. Not checked since. pls sch [facility] appt aug or sept HTN F/U

## 2016-12-07 NOTE — Telephone Encounter (Signed)
amLODipine (NORVASC) 10 MG tablet, refill request.

## 2016-12-08 ENCOUNTER — Other Ambulatory Visit: Payer: Self-pay | Admitting: Internal Medicine

## 2016-12-08 MED ORDER — PANTOPRAZOLE SODIUM 40 MG PO TBEC: 40.0000 mg | DELAYED_RELEASE_TABLET | Freq: Every day | ORAL | 0 refills | Status: DC

## 2016-12-08 NOTE — Telephone Encounter (Signed)
NEEDS REFILL ON PROTONIX 40MG , [facility] [phone]

## 2016-12-09 DIAGNOSIS — H7202 Central perforation of tympanic membrane, left ear: Secondary | ICD-10-CM | POA: Insufficient documentation

## 2016-12-16 ENCOUNTER — Ambulatory Visit (INDEPENDENT_AMBULATORY_CARE_PROVIDER_SITE_OTHER): Payer: Medicaid Other | Admitting: Orthopedic Surgery

## 2016-12-16 ENCOUNTER — Encounter (INDEPENDENT_AMBULATORY_CARE_PROVIDER_SITE_OTHER): Payer: Self-pay | Admitting: Orthopedic Surgery

## 2016-12-16 DIAGNOSIS — M79642 Pain in left hand: Secondary | ICD-10-CM

## 2016-12-16 NOTE — Progress Notes (Signed)
Post-Op Visit Note   Patient: [name] [name]           Date of Birth: 17-Jan-1961           MRN: [id number] Visit Date: 12/16/2016 PCP: Colbert Ewing, MD   Assessment & Plan:  Chief Complaint:  Chief Complaint  Patient presents with  . Left Hand - Routine Post Op   Visit Diagnoses: No diagnosis found.  Plan: Patient's doing well from left hand cyst removal.  There some mounding of the scar but I think that will improve over time.  Her pain is improved.  I'm going to see her back as needed.  Follow-Up Instructions: Return if symptoms worsen or fail to improve.   Orders:  No orders of the defined types were placed in this encounter.  No orders of the defined types were placed in this encounter.   Imaging: No results found.  PMFS History: Patient Active Problem List   Diagnosis Date Noted  . Left hand pain 10/27/2016  . Ganglion cyst 10/06/2016  . 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 BPPV (benign paroxysmal positional vertigo) 10/02/2015  . Liver fibrosis ([place]) 12/04/2014  . Pituitary microadenoma ([place]) 08/08/2014  . Prediabetes 08/05/2014  . Vitamin D deficiency 06/29/2014  . Secondary adrenal insufficiency ([place]) 06/29/2014  . History of tympanostomy tube placement 02/07/2014  . Refusal of blood transfusions as patient is Jehovah's Witness 11/18/2013  . Chronic hepatitis C without hepatic coma ([place]) 03/16/2012  . Postherpetic neuralgia 12/15/2011  . Health care maintenance 12/15/2011  . Chest pain 05/31/2011  . Fatigue 05/18/2011  . Opioid dependence ([place]) 10/05/2010  . HTN (hypertension) 10/05/2010  . Hyperlipidemia associated with type 2 diabetes mellitus ([place]) 10/23/2008  . INSOMNIA 02/14/2007  . HIV disease ([place]) 05/09/2006  . Depression 05/09/2006  . Allergic rhinitis 05/09/2006  . GERD 05/09/2006   Past Medical History:  Diagnosis Date  . Allergic rhinitis 05/09/2006  . Allergy   . Anxiety   . Arthritis     . Asthma   . CHF (congestive heart failure) ([place])   . Chronic back pain   . Diabetes mellitus without complication ([place])   . GERD (gastroesophageal reflux disease)   . Heart murmur    as a child  . 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 s/p 12 week course of simeprevir and sofosbuvir between October 2014 and January 2015 with resolution.  Marland Kitchen History of shingles   . HIV infection ([place])    1994  . Hyperlipidemia    no meds taken now  . Hypertension   . Pituitary microadenoma ([place]) 08/08/2014  . Prediabetes   . Refusal of blood transfusions as patient is Jehovah's Witness   . Secondary adrenal insufficiency ([place]) 06/29/2014    Family History  Problem Relation Age of Onset  . Heart disease Mother   . Diabetes Mother   . Stroke Mother   . Heart disease Father   . Stroke Father   . Diabetes Father   . Hepatitis Sister        hcv  . Stroke Other   . Colon polyps Brother   . Cancer Sister        lung  . Cancer Maternal Aunt   . Cancer Maternal Aunt   . Colon cancer Neg Hx   . Esophageal cancer Neg Hx   . Stomach cancer Neg Hx   . Rectal cancer Neg Hx  Past Surgical History:  Procedure Laterality Date  . BUNIONECTOMY     b/l  . COLECTOMY     2003 for diverticulitis, had colostomy bag and then reversed  . COLONOSCOPY    . HAND SURGERY    . NASAL SINUS SURGERY    . SHOULDER SURGERY     left  . TONSILLECTOMY     Social History   Occupational History  . Not on file.   Social History Main Topics  . Smoking status: Former Smoker    Years: 20.00    Types: Cigarettes    Quit date: 04/26/2007  . Smokeless tobacco: Never Used     Comment: QUIT 2009  . Alcohol use No  . Drug use: No  . Sexual activity: Not on file     Comment: pt. given condoms

## 2016-12-22 ENCOUNTER — Encounter: Payer: Self-pay | Admitting: Internal Medicine

## 2016-12-22 ENCOUNTER — Ambulatory Visit (INDEPENDENT_AMBULATORY_CARE_PROVIDER_SITE_OTHER): Payer: Medicaid Other | Admitting: Internal Medicine

## 2016-12-22 VITALS — BP 146/75 | HR 85 | Temp 98.2°F | Ht 61.0 in | Wt 180.2 lb

## 2016-12-22 DIAGNOSIS — H8110 Benign paroxysmal vertigo, unspecified ear: Secondary | ICD-10-CM

## 2016-12-22 DIAGNOSIS — M47816 Spondylosis without myelopathy or radiculopathy, lumbar region: Secondary | ICD-10-CM | POA: Diagnosis not present

## 2016-12-22 DIAGNOSIS — I10 Essential (primary) hypertension: Secondary | ICD-10-CM

## 2016-12-22 DIAGNOSIS — Z79899 Other long term (current) drug therapy: Secondary | ICD-10-CM

## 2016-12-22 DIAGNOSIS — E2749 Other adrenocortical insufficiency: Secondary | ICD-10-CM

## 2016-12-22 DIAGNOSIS — Z87891 Personal history of nicotine dependence: Secondary | ICD-10-CM | POA: Diagnosis not present

## 2016-12-22 MED ORDER — LOSARTAN POTASSIUM 50 MG PO TABS: 50.0000 mg | ORAL_TABLET | Freq: Every day | ORAL | 1 refills | Status: DC

## 2016-12-22 NOTE — Progress Notes (Signed)
   CC: Patient is here to discuss hypertension and dizziness.  HPI:  [name] is a 56 y.o. female with a past medical history of conditions listed below presenting to the clinic to discuss hypertension and dizziness. Please see problem based charting for the status of the patient's current and chronic medical conditions.   Past Medical History:  Diagnosis Date  . Allergic rhinitis 05/09/2006  . Allergy   . Anxiety   . Arthritis   . Asthma   . CHF (congestive heart failure) ([place])   . Chronic back pain   . Diabetes mellitus without complication ([place])   . GERD (gastroesophageal reflux disease)   . Heart murmur    as a child  . 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 s/p 12 week course of simeprevir and sofosbuvir between October 2014 and January 2015 with resolution.  Marland Kitchen History of shingles   . HIV infection ([place])    1994  . Hyperlipidemia    no meds taken now  . Hypertension   . Pituitary microadenoma ([place]) 08/08/2014  . Prediabetes   . Refusal of blood transfusions as patient is Jehovah's Witness   . Secondary adrenal insufficiency ([place]) 06/29/2014   Review of Systems: Pertinent positives mentioned in HPI. Remainder of all ROS negative.   Physical Exam:  Vitals:   12/22/16 1023  BP: (!) 146/75  Pulse: 85  Temp: 98.2 F (36.8 C)  TempSrc: Oral  SpO2: 98%  Weight: 180 lb 3.2 oz (81.7 kg)  Height: 5\' 1"  (1.549 m)   Physical Exam  Constitutional: She is oriented to person, place, and time. She appears well-developed and well-nourished. No distress.  HENT:  Head: Normocephalic and atraumatic.  Eyes: Right eye exhibits no discharge. Left eye exhibits no discharge.  Cardiovascular: Normal rate, regular rhythm and intact distal pulses.   Pulmonary/Chest: Effort normal and breath sounds normal. No respiratory distress. She has no wheezes. She has no rales.  Abdominal: Soft. Bowel sounds are normal. She exhibits no distension. There  is no tenderness.  Musculoskeletal: She exhibits no edema.  Neurological: She is alert and oriented to person, place, and time.  Reported vertigo with passive head movements.  Skin: Skin is warm and dry.    Assessment & Plan:   See Encounters Tab for problem based charting.  Patient discussed with Dr. Dareen Piano

## 2016-12-22 NOTE — Assessment & Plan Note (Addendum)
Patient is here for a follow-up of her uncontrolled hypertension. Blood pressure 146/75. Currently taking amlodipine 10 mg daily and metoprolol 25 mg twice daily. The dose of amlodipine was increased from 5 mg to 10 mg during her prior visit. Patient reports having intermittent lightheadedness when going from sitting to standing position since she started taking the higher dose of amlodipine. Also reports having intermittent lower extremity edema; no edema at present.  Plan -Discontinue amlodipine -Start losartan 50 mg daily. She has a history of ACE inhibitor induced cough. -Continue metoprolol

## 2016-12-22 NOTE — Assessment & Plan Note (Signed)
Temporary three-month parking sticker signed at this visit.

## 2016-12-22 NOTE — Assessment & Plan Note (Signed)
Patient is being followed by Dr. Buddy Duty (endocrinology). States her next appointment is on 03/24/2017. Reports having intermittent dizziness/lightheadedness which is likely in the setting of amlodipine use, valium use, and BPPV. She is currently on hydrocortisone 10 mg in the morning, 5 mg at noon, and 5 mg in the late afternoon.  Plan -Continue current management -Amlodipine has been discontinued -PT referral for vestibular rehabilitation -Discussed tapering Valium but patient declined stating she needs this medication for sleep. She is taking Valium 5 mg one half tablet 2-3 times a week. -Encouraged her to follow-up with Dr. Buddy Duty

## 2016-12-22 NOTE — Assessment & Plan Note (Signed)
Patient reports having intermittent room spinning sensation. States the most recent episode was yesterday when she was sitting down. On exam, she reported vertigo with passive head movements.  Plan -PT referral for vestibular rehabilitation

## 2016-12-22 NOTE — Progress Notes (Signed)
Internal Medicine Clinic Attending  Case discussed with Dr. Rathoreat the time of the visit. We reviewed the resident's history and exam and pertinent patient test results. I agree with the assessment, diagnosis, and plan of care documented in the resident's note.  

## 2016-12-22 NOTE — Patient Instructions (Signed)
Ms. Ruane it was nice meeting you today.  -STOP taking amlodipine  -Start taking losartan as instructed  -Continue taking metoprolol  -I have referred you to physical therapy for vestibular rehabilitation  -Return to the clinic for a follow-up in one month.

## 2016-12-27 ENCOUNTER — Ambulatory Visit (HOSPITAL_BASED_OUTPATIENT_CLINIC_OR_DEPARTMENT_OTHER): Payer: Medicaid Other | Admitting: Physical Medicine & Rehabilitation

## 2016-12-27 ENCOUNTER — Encounter: Payer: Self-pay | Admitting: Physical Medicine & Rehabilitation

## 2016-12-27 ENCOUNTER — Encounter: Payer: Medicaid Other | Attending: Physical Medicine & Rehabilitation

## 2016-12-27 VITALS — BP 157/90 | HR 76

## 2016-12-27 DIAGNOSIS — M545 Low back pain: Secondary | ICD-10-CM | POA: Diagnosis not present

## 2016-12-27 DIAGNOSIS — M47816 Spondylosis without myelopathy or radiculopathy, lumbar region: Secondary | ICD-10-CM

## 2016-12-27 DIAGNOSIS — G8929 Other chronic pain: Secondary | ICD-10-CM | POA: Diagnosis not present

## 2016-12-27 NOTE — Patient Instructions (Signed)
Take Valium 5 mg prior to next back procedure

## 2016-12-27 NOTE — Progress Notes (Signed)
Subjective:    [name], female    DOB: 03/17/1961, 56 y.o.   MRN: [id number]  HPI 6/1 Right L3,L4 Medial branch and L5 dorsal ramus RFA with good relief for ~2.5 months, gets relief from tramadol Pain increased with activity Right sided Low back  Minimal on left Has had weight gain on steroids for Addison's  Pain Inventory Average Pain 5 Pain Right Now 8 My pain is sharp  In the last 24 hours, has pain interfered with the following? General activity 5 Relation with others 5 Enjoyment of life 8 What TIME of day is your pain at its worst? daytime Sleep (in general) Poor  Pain is worse with: walking and bending Pain improves with: injections Relief from Meds: 7  Mobility walk without assistance use a cane ability to climb steps?  no do you drive?  no  Function disabled: date disabled .  Neuro/Psych weakness trouble walking dizziness anxiety  Prior Studies Any changes since last visit?  no  Physicians involved in your care Any changes since last visit?  no   Family History  Problem Relation Age of Onset  . Heart disease Mother   . Diabetes Mother   . Stroke Mother   . Heart disease Father   . Stroke Father   . Diabetes Father   . Hepatitis Sister        hcv  . Stroke Other   . Colon polyps Brother   . Cancer Sister        lung  . Cancer Maternal Aunt   . Cancer Maternal Aunt   . Colon cancer Neg Hx   . Esophageal cancer Neg Hx   . Stomach cancer Neg Hx   . Rectal cancer Neg Hx    Social History   Social History  . Marital status: Single    Spouse name: N/A  . Number of children: 0  . Years of education: N/A   Social History Main Topics  . Smoking status: Former Smoker    Years: 20.00    Types: Cigarettes    Quit date: 04/26/2007  . Smokeless tobacco: Never Used     Comment: QUIT 2009  . Alcohol use No  . Drug use: No  . Sexual activity: Not on file     Comment: pt. given condoms   Other Topics Concern  . Not on  file   Social History Narrative   Alternate # [phone]   Past Surgical History:  Procedure Laterality Date  . BUNIONECTOMY     b/l  . COLECTOMY     2003 for diverticulitis, had colostomy bag and then reversed  . COLONOSCOPY    . HAND SURGERY    . NASAL SINUS SURGERY    . SHOULDER SURGERY     left  . TONSILLECTOMY     Past Medical History:  Diagnosis Date  . Allergic rhinitis 05/09/2006  . Allergy   . Anxiety   . Arthritis   . Asthma   . CHF (congestive heart failure) ([place])   . Chronic back pain   . Diabetes mellitus without complication ([place])   . GERD (gastroesophageal reflux disease)   . Heart murmur    as a child  . 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 s/p 12 week course of simeprevir and sofosbuvir between October 2014 and January 2015 with resolution.  Marland Kitchen History of shingles   . HIV infection ([place])  1994  . Hyperlipidemia    no meds taken now  . Hypertension   . Pituitary microadenoma ([place]) 08/08/2014  . Prediabetes   . Refusal of blood transfusions as patient is Jehovah's Witness   . Secondary adrenal insufficiency ([place]) 06/29/2014   There were no vitals taken for this visit.  Opioid Risk Score:   Fall Risk Score:  `1  Depression screen PHQ 2/9  Depression screen [facility] 2/9 12/22/2016 10/06/2016 09/23/2016 07/27/2016 07/26/2016 07/07/2016 03/21/2016  Decreased Interest 0 0 0 0 0 0 0  Down, Depressed, Hopeless 0 0 0 0 0 0 0  PHQ - 2 Score 0 0 0 0 0 0 0  Altered sleeping - - - - - - -  Tired, decreased energy - - - - - - -  Change in appetite - - - - - - -  Feeling bad or failure about yourself  - - - - - - -  Trouble concentrating - - - - - - -  Moving slowly or fidgety/restless - - - - - - -  Suicidal thoughts - - - - - - -  PHQ-9 Score - - - - - - -  Some recent data might be hidden     Review of Systems  Constitutional: Positive for appetite change, diaphoresis and unexpected weight change.  HENT: Negative.     Eyes: Negative.   Respiratory: Negative.   Cardiovascular: Negative.   Gastrointestinal: Positive for nausea.  Endocrine: Negative.   Genitourinary: Negative.   Musculoskeletal: Positive for joint swelling.  Skin: Negative.   Allergic/Immunologic: Negative.   Neurological: Negative.   Hematological: Negative.   Psychiatric/Behavioral: Negative.   All other systems reviewed and are negative.      Objective:   Physical Exam  Constitutional: She is oriented to person, place, and time. She appears well-developed and well-nourished. No distress.  HENT:  Head: Normocephalic and atraumatic.  Eyes: Pupils are equal, round, and reactive to light. Conjunctivae and EOM are normal.  Neck: Normal range of motion.  Musculoskeletal:       Lumbar back: She exhibits decreased range of motion. She exhibits no deformity.  Mild tenderness to palpation right lumbosacral junction paraspinal area.  Negative straight leg raise  Neurological: She is alert and oriented to person, place, and time. No sensory deficit.  Motor strength is 5/5 bilateral hip flexor, knee extensor, ankle dorsal flexion.  Skin: She is not diaphoretic.  Psychiatric: She has a normal mood and affect.  Nursing note and vitals reviewed.         Assessment & Plan:  1. Lumbar spondylosis without myelopathy. Patient has had good relief with radiofrequency neurotomy, but short-lived duration. May repeat in 3 months She is not using any pain medication on a regular basis. She has received some tramadol from Dr. Marlou Sa

## 2017-01-02 ENCOUNTER — Telehealth: Payer: Self-pay | Admitting: Internal Medicine

## 2017-01-02 NOTE — Telephone Encounter (Signed)
Pls requesting callback, medicine may be allergic reaction

## 2017-01-02 NOTE — Telephone Encounter (Signed)
Patient was seen in the [facility] on 8/30 by Dr. Marlowe Sax, will defer to her as I have not seen the patient.

## 2017-01-02 NOTE — Telephone Encounter (Signed)
rtc to pt, she thinks its the cozaar causing her to feel extremely tired, nausea and dizziness, appt 9/11, advised to got to ED if worse, she is agreeable

## 2017-01-03 ENCOUNTER — Encounter: Payer: Self-pay | Admitting: Internal Medicine

## 2017-01-03 ENCOUNTER — Ambulatory Visit (INDEPENDENT_AMBULATORY_CARE_PROVIDER_SITE_OTHER): Payer: Medicaid Other | Admitting: Internal Medicine

## 2017-01-03 VITALS — BP 165/96 | HR 75 | Temp 98.3°F | Ht 61.0 in | Wt 179.6 lb

## 2017-01-03 DIAGNOSIS — Z79899 Other long term (current) drug therapy: Secondary | ICD-10-CM

## 2017-01-03 DIAGNOSIS — Z8249 Family history of ischemic heart disease and other diseases of the circulatory system: Secondary | ICD-10-CM

## 2017-01-03 DIAGNOSIS — R21 Rash and other nonspecific skin eruption: Secondary | ICD-10-CM

## 2017-01-03 DIAGNOSIS — I10 Essential (primary) hypertension: Secondary | ICD-10-CM | POA: Diagnosis present

## 2017-01-03 DIAGNOSIS — Z87891 Personal history of nicotine dependence: Secondary | ICD-10-CM

## 2017-01-03 DIAGNOSIS — R42 Dizziness and giddiness: Secondary | ICD-10-CM

## 2017-01-03 DIAGNOSIS — E2749 Other adrenocortical insufficiency: Secondary | ICD-10-CM | POA: Diagnosis not present

## 2017-01-03 MED ORDER — HYDROCHLOROTHIAZIDE 25 MG PO TABS: 25.0000 mg | ORAL_TABLET | Freq: Every day | ORAL | 0 refills | Status: DC

## 2017-01-03 NOTE — Progress Notes (Signed)
    CC: HTN HPI: [name] is a 56 y.o. woman with PMH noted below here after experiencing some worsening dizziness, rash, and nausea after starting the losartan and is here to discuss the hypertension regimen.  Please see Problem List/A&P for the status of the patient's chronic medical problems   Past Medical History:  Diagnosis Date  . Allergic rhinitis 05/09/2006  . Allergy   . Anxiety   . Arthritis   . Asthma   . CHF (congestive heart failure) ([place])   . Chronic back pain   . Diabetes mellitus without complication ([place])   . GERD (gastroesophageal reflux disease)   . Heart murmur    as a child  . 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 s/p 12 week course of simeprevir and sofosbuvir between October 2014 and January 2015 with resolution.  Marland Kitchen History of shingles   . HIV infection ([place])    1994  . Hyperlipidemia    no meds taken now  . Hypertension   . Pituitary microadenoma ([place]) 08/08/2014  . Prediabetes   . Refusal of blood transfusions as patient is Jehovah's Witness   . Secondary adrenal insufficiency ([place]) 06/29/2014    Review of Systems: Denies fevers, chills, fatigue. Says dizziness has improved after stopping the losartan yesterday. Says rash is gone.  Denies headaches, cough, dyspnea Denies n/v/abd pain/d/c  Physical Exam: Vitals:   01/03/17 1403  BP: (!) 165/96  Pulse: 75  Temp: 98.3 F (36.8 C)  TempSrc: Oral  SpO2: 97%  Weight: 179 lb 9.6 oz (81.5 kg)  Height: 5\' 1"  (1.549 m)    General: A&O, in NAD HEENT: EOMI, sclera white, conjunctiva pink  Neck: supple, midline trachea,   CV: RRR, normal s1, s2, no m/r/g,  Resp: equal and symmetric breath sounds, no wheezing or crackles  Skin: no signs of facial rash   Assessment & Plan:   See encounters tab for problem based medical decision making. Patient discussed with Dr Dareen Piano

## 2017-01-03 NOTE — Telephone Encounter (Signed)
Patient was seen by Dr. Tiburcio Pea in clinic today.

## 2017-01-03 NOTE — Assessment & Plan Note (Addendum)
BP Readings from Last 3 Encounters:  01/03/17 (!) 165/96  12/27/16 (!) 157/90  12/22/16 (!) 146/75   Patient is here because on 8/30, her amlodipine was changed to losartan and since starting the losartan, she noticed her chronic dizziness worsened, she developed facial rash, and also experienced some nausea. All of her symptoms subsided after stopping the losartan yesterday.  However as a result of stopping the losartan, patient was hypertensive today. Prior notes indicated that she was able to tolerate the hydrochlorothiazide, but it was stopped in 2012, due to mild hypokalemia.  Patient is willing to try hctz again and follow up at the next appt in 2-3 weeks for BMET check and re-assessment.  Plan -continue metoprolol -start hctz 25mg  daily -BMET at the time of the follow up -I have added the losartan to the allergy list

## 2017-01-03 NOTE — Assessment & Plan Note (Signed)
Patient follows with endocrine. She is currently on hydrocortisone 10/5/5 daily and she is compliant with it. She says her dizziness, and rash has improved after stopping losartan. She wears the bracelet indicating that she takes steroids.  Plan -follow up with endocrinology

## 2017-01-03 NOTE — Patient Instructions (Addendum)
Thank you for your visit today  We have started you on hydrochlorothiazide which is for your blood pressure  Please follow up on your scheduled appt on Sept 27th to re-assess your blood pressure.  Please follow up with your endocrinologist, vestibular rehab therapy

## 2017-01-05 NOTE — Progress Notes (Signed)
Internal Medicine Clinic Attending  Case discussed with Dr. Saraiya at the time of the visit.  We reviewed the resident's history and exam and pertinent patient test results.  I agree with the assessment, diagnosis, and plan of care documented in the resident's note.  

## 2017-01-19 ENCOUNTER — Ambulatory Visit: Payer: Self-pay

## 2017-01-19 ENCOUNTER — Encounter: Payer: Medicaid Other | Admitting: Internal Medicine

## 2017-01-23 ENCOUNTER — Encounter: Payer: Self-pay | Admitting: Internal Medicine

## 2017-01-23 ENCOUNTER — Ambulatory Visit (INDEPENDENT_AMBULATORY_CARE_PROVIDER_SITE_OTHER): Payer: Medicaid Other | Admitting: Internal Medicine

## 2017-01-23 ENCOUNTER — Ambulatory Visit (HOSPITAL_COMMUNITY)
Admission: RE | Admit: 2017-01-23 | Discharge: 2017-01-23 | Disposition: A | Discharge status: Home / Self Care | Payer: Medicaid Other | Source: Ambulatory Visit | Attending: Internal Medicine | Admitting: Internal Medicine

## 2017-01-23 VITALS — BP 188/100 | HR 86 | Temp 98.2°F | Ht 61.0 in | Wt 179.9 lb

## 2017-01-23 DIAGNOSIS — I7389 Other specified peripheral vascular diseases: Secondary | ICD-10-CM | POA: Insufficient documentation

## 2017-01-23 DIAGNOSIS — H814 Vertigo of central origin: Secondary | ICD-10-CM

## 2017-01-23 DIAGNOSIS — I10 Essential (primary) hypertension: Secondary | ICD-10-CM

## 2017-01-23 DIAGNOSIS — H8149 Vertigo of central origin, unspecified ear: Secondary | ICD-10-CM | POA: Insufficient documentation

## 2017-01-23 DIAGNOSIS — E119 Type 2 diabetes mellitus without complications: Secondary | ICD-10-CM | POA: Diagnosis not present

## 2017-01-23 DIAGNOSIS — I509 Heart failure, unspecified: Secondary | ICD-10-CM | POA: Diagnosis not present

## 2017-01-23 DIAGNOSIS — Z23 Encounter for immunization: Secondary | ICD-10-CM | POA: Insufficient documentation

## 2017-01-23 DIAGNOSIS — B20 Human immunodeficiency virus [HIV] disease: Secondary | ICD-10-CM | POA: Diagnosis not present

## 2017-01-23 MED ORDER — AMLODIPINE BESYLATE 10 MG PO TABS: 10.0000 mg | ORAL_TABLET | Freq: Every day | ORAL | 2 refills | Status: DC

## 2017-01-23 NOTE — Assessment & Plan Note (Signed)
Flu shot given today

## 2017-01-23 NOTE — Assessment & Plan Note (Addendum)
Patient with worsened and change in her symptoms of dizziness. Episodes are lasting longer, occurring without positional change, and impacting her quality of life. Would be most concerned about posterior circulation stroke of vertebrobasilar insufficiency. Will evaluate with MRI of brain, MRA head/neck. If this is negative, would suspect possible medication related effect. Will then resend referral to vestibular PT and have her change her HCTZ back to Amlodipine. - STAT MRI Brain, MRA Head/Neck - If evidence of stroke seen, will call patient to have her admitted; she is agreeable to this plan - If negative will resend referral to Vestibular PT  ADDENDUM: MRI/MRA Brain/neck is negative for acute or subacute stroke. I spoke to the patient informing her of the results. Will resend referral for Vestibular PT. She understands and is agreeable.

## 2017-01-23 NOTE — Patient Instructions (Signed)
It was a pleasure to meet you [name] [name].  We will obtain an MRI of your head to evaluate for the cause of your persistent dizziness. We will call you with the results.  If there is no emergent results from your imaging, we will resend a referral for Vestibular Physical Therapy.  We will switch your blood pressure medications back to Amlodipine 10 mg daily. Stop the HCTZ. Continue your Metoprolol.

## 2017-01-23 NOTE — Progress Notes (Signed)
CC: Dizziness  HPI:  [name] [name] is a 56 y.o. female with PMH as listed below who presents with complaint of persistent dizziness and HTN.  Persistent Dizziness: Patient reports dizziness which has been ongoing for about 1.5-2 months now. Initially her symptoms were vertiginous in nature with noticeable triggers of positional change. She was diagnosed with BPPV and referred to Vestibular PT which she has not been able to schedule as she has not been contacted. Her BP medications were considered to be contributing and her Amlodipine was changed to Losartan. Her symptoms persisted and her BP was uncontrolled so her Losartan was changed to HCTZ. She returns with worsening dizziness which now occurs at rest and lasting up to 10 minutes at a time. She had an episode in church yesterday where she "nearly passed out" with significant room spinning sensation and diaphoresis. She did not lose consciousness of fall or injure herself. She reports chronic nausea without emesis and headache from her pituitary microadenoma and chronic steroid use. She has significant interruption in her ability to perform her ADLs. She does not drive. She reports significant family history of stroke in her mother. She has not had new focal deficits. She has seen ENT for eustachian tube dysfunction and has a tympanostomy tube in her left ear. She reports decreased hearing and blurry vision.  HTN: Patient with uncontrolled hypertension with recent medication changes. She has previously been taking Metoprolol 25 mg BID and Amlodipine 10 mg daily. As above, her Amlodipine was changed to Losartan. Her symptoms persisted and her BP was uncontrolled so her Losartan was changed to HCTZ 25 mg daily to which she reports adherence. She checks her BP at home but forgot her log. She says recent readings have been 146/100, 165/99, 155/100. She feels her BP was much better controlled when she was on Amlodipine and that her dizziness has  worsened on HCTZ.   Influenza Immunization: Patient requesting flu shot today.  Past Medical History:  Diagnosis Date  . Allergic rhinitis 05/09/2006  . Allergy   . Anxiety   . Arthritis   . Asthma   . CHF (congestive heart failure) ([place])   . Chronic back pain   . Diabetes mellitus without complication ([place])   . GERD (gastroesophageal reflux disease)   . Heart murmur    as a child  . 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 s/p 12 week course of simeprevir and sofosbuvir between October 2014 and January 2015 with resolution.  Marland Kitchen History of shingles   . HIV infection ([place])    1994  . Hyperlipidemia    no meds taken now  . Hypertension   . Pituitary microadenoma ([place]) 08/08/2014  . Prediabetes   . Refusal of blood transfusions as patient is Jehovah's Witness   . Secondary adrenal insufficiency ([place]) 06/29/2014   Review of Systems:   Review of Systems  Constitutional: Negative for chills and fever.  HENT:       Decreased hearing  Eyes: Positive for blurred vision.  Respiratory: Negative for shortness of breath.   Cardiovascular: Negative for chest pain and palpitations.  Musculoskeletal: Positive for back pain. Negative for falls.  Neurological: Positive for dizziness. Negative for sensory change, focal weakness, seizures, loss of consciousness and weakness.     Physical Exam:  Vitals:   01/23/17 0945  BP: (!) 188/100  Pulse: 86  Temp: 98.2 F (36.8 C)  TempSrc: Oral  SpO2: 99%  Weight: 179 lb 14.4 oz (81.6 kg)  Height: 5\' 1"  (1.549 m)   Physical Exam  Constitutional: She is oriented to person, place, and time. She appears well-developed and well-nourished.  HENT:  Head: Normocephalic and atraumatic.  Tympanostomy tube right ear with crusty cerumen, left ear TM difficult to visualize  Eyes:  Rightward nystagmus, PERRLA  Cardiovascular: Normal rate and regular rhythm.   Pulmonary/Chest: Effort normal. No respiratory distress.  She has no wheezes. She has no rales.  Musculoskeletal:  Strength 5/5 all extremities.  Neurological: She is alert and oriented to person, place, and time.  Finger to nose intact  Skin: She is not diaphoretic.     Assessment & Plan:   See Encounters Tab for problem based charting.  Patient discussed with Dr. Angelia Mould  Vertigo of central origin Patient with worsened and change in her symptoms of dizziness. Episodes are lasting longer, occurring without positional change, and impacting her quality of life. Would be most concerned about posterior circulation stroke of vertebrobasilar insufficiency. Will evaluate with MRI of brain, MRA head/neck. If this is negative, would suspect possible medication related effect. Will then resend referral to vestibular PT and have her change her HCTZ back to Amlodipine. - STAT MRI Brain, MRA Head/Neck - If evidence of stroke seen, will call patient to have her admitted; she is agreeable to this plan - If negative will resend referral to Vestibular PT  HTN (hypertension) BP Readings from Last 3 Encounters:  01/23/17 (!) 188/100  01/03/17 (!) 165/96  12/27/16 (!) 157/90   BP significantly elevated. She reports adherence to Metoprolol 25 mg BID and HCTZ 25 mg daily. She reports worsened control and symptoms of dizziness on HCTZ and felt she did much better when on Amlodipine. - Obtaining MRI imaging to assess for stroke - Change HCTZ back to Amlodipine 10 mg daily; have room to go up on Metoprolol if needed  Need for immunization against influenza Flu shot given today.

## 2017-01-23 NOTE — Progress Notes (Signed)
Internal Medicine Clinic Attending  Case discussed with Dr. Patel at the time of the visit.  We reviewed the resident's history and exam and pertinent patient test results.  I agree with the assessment, diagnosis, and plan of care documented in the resident's note.  

## 2017-01-23 NOTE — Assessment & Plan Note (Signed)
BP Readings from Last 3 Encounters:  01/23/17 (!) 188/100  01/03/17 (!) 165/96  12/27/16 (!) 157/90   BP significantly elevated. She reports adherence to Metoprolol 25 mg BID and HCTZ 25 mg daily. She reports worsened control and symptoms of dizziness on HCTZ and felt she did much better when on Amlodipine. - Obtaining MRI imaging to assess for stroke - Change HCTZ back to Amlodipine 10 mg daily; have room to go up on Metoprolol if needed

## 2017-01-24 NOTE — Addendum Note (Signed)
Addended by: Lenore Cordia on: 01/24/2017 08:38 AM   Modules accepted: Orders

## 2017-01-26 ENCOUNTER — Encounter: Payer: Medicaid Other | Admitting: Internal Medicine

## 2017-02-06 ENCOUNTER — Other Ambulatory Visit: Payer: Self-pay | Admitting: Infectious Diseases

## 2017-02-06 DIAGNOSIS — B20 Human immunodeficiency virus [HIV] disease: Secondary | ICD-10-CM

## 2017-02-15 NOTE — Progress Notes (Signed)
   CC: Follow-up on essential hypertension  HPI:  [name] is a 56 y.o. female with history noted below that presents to the internal medicine clinic for follow-up on essential hypertension. Please see problem based charting for status of patient's chronic medical conditions.  Past Medical History:  Diagnosis Date  . Allergic rhinitis 05/09/2006  . Allergy   . Anxiety   . Arthritis   . Asthma   . CHF (congestive heart failure) ([place])   . Chronic back pain   . Diabetes mellitus without complication ([place])   . GERD (gastroesophageal reflux disease)   . Heart murmur    as a child  . 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 s/p 12 week course of simeprevir and sofosbuvir between October 2014 and January 2015 with resolution.  Marland Kitchen History of shingles   . HIV infection ([place])    1994  . Hyperlipidemia    no meds taken now  . Hypertension   . Pituitary microadenoma ([place]) 08/08/2014  . Prediabetes   . Refusal of blood transfusions as patient is Jehovah's Witness   . Secondary adrenal insufficiency ([place]) 06/29/2014    Review of Systems:  Review of Systems  Eyes: Negative for blurred vision.  Respiratory: Negative for shortness of breath.   Cardiovascular: Negative for chest pain.  Gastrointestinal: Positive for nausea. Negative for vomiting.  Musculoskeletal: Negative for falls.  Neurological: Positive for dizziness. Negative for speech change and focal weakness.     Physical Exam:  Vitals:   02/16/17 1515  BP: (!) 150/84  Pulse: 83  Temp: 97.7 F (36.5 C)  TempSrc: Oral  SpO2: 98%  Weight: 181 lb 4.8 oz (82.2 kg)  Height: 5\' 1"  (1.549 m)   Physical Exam  Constitutional: She is well-developed, well-nourished, and in no distress.  Cardiovascular: Normal rate, regular rhythm and normal heart sounds.  Exam reveals no gallop and no friction rub.   No murmur heard. Pulmonary/Chest: Effort normal and breath sounds normal. No respiratory  distress. She has no wheezes. She has no rales.    Assessment & Plan:   See encounters tab for problem based medical decision making.    Patient discussed with Dr. Dareen Piano

## 2017-02-16 ENCOUNTER — Encounter: Payer: Self-pay | Admitting: Internal Medicine

## 2017-02-16 ENCOUNTER — Other Ambulatory Visit: Payer: Self-pay | Admitting: Internal Medicine

## 2017-02-16 ENCOUNTER — Ambulatory Visit (INDEPENDENT_AMBULATORY_CARE_PROVIDER_SITE_OTHER): Payer: Medicaid Other | Admitting: Internal Medicine

## 2017-02-16 VITALS — BP 150/84 | HR 83 | Temp 97.7°F | Ht 61.0 in | Wt 181.3 lb

## 2017-02-16 DIAGNOSIS — Z79899 Other long term (current) drug therapy: Secondary | ICD-10-CM

## 2017-02-16 DIAGNOSIS — H8110 Benign paroxysmal vertigo, unspecified ear: Secondary | ICD-10-CM | POA: Diagnosis not present

## 2017-02-16 DIAGNOSIS — Z87891 Personal history of nicotine dependence: Secondary | ICD-10-CM | POA: Diagnosis not present

## 2017-02-16 DIAGNOSIS — E099 Drug or chemical induced diabetes mellitus without complications: Secondary | ICD-10-CM | POA: Diagnosis not present

## 2017-02-16 DIAGNOSIS — E2740 Unspecified adrenocortical insufficiency: Secondary | ICD-10-CM

## 2017-02-16 DIAGNOSIS — I10 Essential (primary) hypertension: Secondary | ICD-10-CM

## 2017-02-16 DIAGNOSIS — Z888 Allergy status to other drugs, medicaments and biological substances status: Secondary | ICD-10-CM | POA: Diagnosis not present

## 2017-02-16 DIAGNOSIS — Z7952 Long term (current) use of systemic steroids: Secondary | ICD-10-CM

## 2017-02-16 DIAGNOSIS — T380X5A Adverse effect of glucocorticoids and synthetic analogues, initial encounter: Secondary | ICD-10-CM

## 2017-02-16 LAB — POCT GLYCOSYLATED HEMOGLOBIN (HGB A1C): HEMOGLOBIN A1C: 6.9

## 2017-02-16 LAB — GLUCOSE, CAPILLARY: GLUCOSE-CAPILLARY: 123 mg/dL — AB (ref 65–99)

## 2017-02-16 MED ORDER — VITAMIN D (CHOLECALCIFEROL) 25 MCG (1000 UT) PO CAPS: 1000.0000 mg | ORAL_CAPSULE | Freq: Every day | ORAL | 3 refills | Status: DC

## 2017-02-16 MED ORDER — PANTOPRAZOLE SODIUM 40 MG PO TBEC: 40.0000 mg | DELAYED_RELEASE_TABLET | Freq: Every day | ORAL | 4 refills | Status: DC

## 2017-02-16 MED ORDER — MONTELUKAST SODIUM 10 MG PO TABS: 10.0000 mg | ORAL_TABLET | Freq: Every day | ORAL | 6 refills | Status: DC

## 2017-02-16 MED ORDER — CETIRIZINE HCL 10 MG PO TABS: ORAL_TABLET | ORAL | 3 refills | Status: DC

## 2017-02-16 MED ORDER — DIAZEPAM 5 MG PO TABS: ORAL_TABLET | ORAL | 0 refills | Status: DC

## 2017-02-16 MED ORDER — METOPROLOL TARTRATE 25 MG PO TABS: 25.0000 mg | ORAL_TABLET | Freq: Two times a day (BID) | ORAL | 5 refills | Status: DC

## 2017-02-16 MED ORDER — METFORMIN HCL ER 500 MG PO TB24: 500.0000 mg | ORAL_TABLET | Freq: Every day | ORAL | 11 refills | Status: DC

## 2017-02-16 MED ORDER — DIPHENHYDRAMINE HCL 25 MG PO TABS: ORAL_TABLET | ORAL | 3 refills | Status: DC

## 2017-02-16 MED ORDER — ATORVASTATIN CALCIUM 10 MG PO TABS: 10.0000 mg | ORAL_TABLET | Freq: Every day | ORAL | 3 refills | Status: DC

## 2017-02-16 MED ORDER — AMLODIPINE BESYLATE 10 MG PO TABS: 10.0000 mg | ORAL_TABLET | Freq: Every day | ORAL | 5 refills | Status: DC

## 2017-02-16 MED ORDER — PROMETHAZINE HCL 25 MG PO TABS: 25.0000 mg | ORAL_TABLET | Freq: Four times a day (QID) | ORAL | 3 refills | Status: DC | PRN

## 2017-02-16 NOTE — Patient Instructions (Signed)
Ms. Duarte,  It was a pleasure meeting you today. Please start taking metformin 1 pill a day for one week then increase it to 2 pills a day. Please follow up in 3 months.

## 2017-02-19 NOTE — Assessment & Plan Note (Addendum)
Assessment: Steroid-induced diabetes mellitus Patient takes Cortef for adrenal insufficiency. Today hemoglobin A1c was 6.9. Will start patient on metformin and titrate to 500 mg twice a day. Will reassess Hgb A1C in 3 months.  Plan - Metformin 500mg  for one week then 500mg  BID  - reassess hgbA1C in 3 months

## 2017-02-19 NOTE — Assessment & Plan Note (Signed)
Assessment:  Essential hypertension Patient currently takes metoprolol 25mg  BID and amlodipine 10mg  daily.  She reports allergy of facial rash with losartan and dizzines on HCTZ.  Today/pressure is 150/84. On previous clinic visit 10/1 her blood pressure was 188/100 and at that time her blood pressure medications were switched to her current regimen.  Will not make any further changes at this time. And will reassess BP at next visit.  Plan -Continue metoprolol 25mg  BID and amlodipine 10mg  daily -Reassess blood pressure at next visit

## 2017-02-19 NOTE — Assessment & Plan Note (Signed)
Assessment: Vertigo Patient has been evaluated for dizziness in the past 3 clinic visits. She has been referred to vestibular rehab however has not received a call to make an appointment.  In previous clinic visits there have been changes in her blood pressure medications to see if this would help her symptoms.  Unfortunately the changes made her symptoms worse and she is back on her original blood pressure medications. Patient had an MRI brain, MRA head/neck concern for posterior circulation stroke and images were negative for any acute or subacute process.  Patient describes classic BPPV symptoms. She states that the dizziness can come on suddenly especially with sudden head movements and is worse in the morning when she gets out of bed. Will put in another referral for vestibular rehabilitation as I think this may be the best solution for her symptoms at this time.   Plan -PT referral for vestibular rehab

## 2017-02-21 ENCOUNTER — Other Ambulatory Visit: Payer: Self-pay | Admitting: Internal Medicine

## 2017-02-21 ENCOUNTER — Other Ambulatory Visit: Payer: Medicaid Other

## 2017-02-21 DIAGNOSIS — Z79899 Other long term (current) drug therapy: Secondary | ICD-10-CM

## 2017-02-21 DIAGNOSIS — E1169 Type 2 diabetes mellitus with other specified complication: Secondary | ICD-10-CM

## 2017-02-21 DIAGNOSIS — B182 Chronic viral hepatitis C: Secondary | ICD-10-CM

## 2017-02-21 DIAGNOSIS — E785 Hyperlipidemia, unspecified: Secondary | ICD-10-CM

## 2017-02-21 DIAGNOSIS — B20 Human immunodeficiency virus [HIV] disease: Secondary | ICD-10-CM

## 2017-02-22 LAB — COMPREHENSIVE METABOLIC PANEL WITH GFR
AG Ratio: 1.4 (calc) (ref 1.0–2.5)
ALT: 20 U/L (ref 6–29)
AST: 15 U/L (ref 10–35)
Albumin: 4.1 g/dL (ref 3.6–5.1)
Alkaline phosphatase (APISO): 79 U/L (ref 33–130)
BUN: 12 mg/dL (ref 7–25)
CO2: 28 mmol/L (ref 20–32)
Calcium: 9.5 mg/dL (ref 8.6–10.4)
Chloride: 103 mmol/L (ref 98–110)
Creat: 0.79 mg/dL (ref 0.50–1.05)
Globulin: 3 g/dL (ref 1.9–3.7)
Glucose, Bld: 119 mg/dL — ABNORMAL HIGH (ref 65–99)
Potassium: 3.9 mmol/L (ref 3.5–5.3)
Sodium: 139 mmol/L (ref 135–146)
Total Bilirubin: 0.3 mg/dL (ref 0.2–1.2)
Total Protein: 7.1 g/dL (ref 6.1–8.1)

## 2017-02-22 LAB — CBC
HCT: 41.8 % (ref 35.0–45.0)
Hemoglobin: 13.8 g/dL (ref 11.7–15.5)
MCH: 26.6 pg — ABNORMAL LOW (ref 27.0–33.0)
MCHC: 33 g/dL (ref 32.0–36.0)
MCV: 80.7 fL (ref 80.0–100.0)
MPV: 11 fL (ref 7.5–12.5)
PLATELETS: 248 10*3/uL (ref 140–400)
RBC: 5.18 10*6/uL — AB (ref 3.80–5.10)
RDW: 15.1 % — ABNORMAL HIGH (ref 11.0–15.0)
WBC: 8.8 10*3/uL (ref 3.8–10.8)

## 2017-02-22 LAB — T-HELPER CELL (CD4) - (RCID CLINIC ONLY)
CD4 % Helper T Cell: 26 % — ABNORMAL LOW (ref 33–55)
CD4 T Cell Abs: 810 /uL (ref 400–2700)

## 2017-02-22 LAB — LIPID PANEL
CHOL/HDL RATIO: 4.3 (calc) (ref <5.0)
Cholesterol: 160 mg/dL (ref <200)
HDL: 37 mg/dL — AB (ref >50)
LDL CHOLESTEROL (CALC): 86 mg/dL
NON-HDL CHOLESTEROL (CALC): 123 mg/dL (ref <130)
TRIGLYCERIDES: 306 mg/dL — AB (ref <150)

## 2017-02-23 LAB — HIV-1 RNA QUANT-NO REFLEX-BLD
HIV 1 RNA QUANT: 31 {copies}/mL — AB
HIV-1 RNA QUANT, LOG: 1.49 {Log_copies}/mL — AB

## 2017-02-23 LAB — HEPATITIS C RNA QUANTITATIVE
HCV QUANT LOG: NOT DETECTED {Log_IU}/mL
HCV RNA, PCR, QN: <15 IU/mL

## 2017-02-26 NOTE — Progress Notes (Signed)
Internal Medicine Clinic Attending  Case discussed with Dr. Hoffman at the time of the visit.  We reviewed the resident's history and exam and pertinent patient test results.  I agree with the assessment, diagnosis, and plan of care documented in the resident's note.  

## 2017-03-06 ENCOUNTER — Ambulatory Visit (INDEPENDENT_AMBULATORY_CARE_PROVIDER_SITE_OTHER): Payer: Medicaid Other | Admitting: Infectious Diseases

## 2017-03-06 ENCOUNTER — Encounter: Payer: Self-pay | Admitting: Infectious Diseases

## 2017-03-06 VITALS — BP 165/95 | HR 82 | Temp 98.0°F | Ht 61.0 in | Wt 184.0 lb

## 2017-03-06 DIAGNOSIS — K7400 Hepatic fibrosis, unspecified: Secondary | ICD-10-CM

## 2017-03-06 DIAGNOSIS — Z79899 Other long term (current) drug therapy: Secondary | ICD-10-CM

## 2017-03-06 DIAGNOSIS — B182 Chronic viral hepatitis C: Secondary | ICD-10-CM

## 2017-03-06 DIAGNOSIS — Z23 Encounter for immunization: Secondary | ICD-10-CM

## 2017-03-06 DIAGNOSIS — E785 Hyperlipidemia, unspecified: Secondary | ICD-10-CM

## 2017-03-06 DIAGNOSIS — H814 Vertigo of central origin: Secondary | ICD-10-CM

## 2017-03-06 DIAGNOSIS — B20 Human immunodeficiency virus [HIV] disease: Secondary | ICD-10-CM

## 2017-03-06 DIAGNOSIS — Z113 Encounter for screening for infections with a predominantly sexual mode of transmission: Secondary | ICD-10-CM

## 2017-03-06 DIAGNOSIS — I10 Essential (primary) hypertension: Secondary | ICD-10-CM

## 2017-03-06 DIAGNOSIS — E1169 Type 2 diabetes mellitus with other specified complication: Secondary | ICD-10-CM

## 2017-03-06 DIAGNOSIS — H8110 Benign paroxysmal vertigo, unspecified ear: Secondary | ICD-10-CM

## 2017-03-06 DIAGNOSIS — H8149 Vertigo of central origin, unspecified ear: Secondary | ICD-10-CM

## 2017-03-06 DIAGNOSIS — D352 Benign neoplasm of pituitary gland: Secondary | ICD-10-CM

## 2017-03-06 NOTE — Assessment & Plan Note (Signed)
Appreciate endo f/u.

## 2017-03-06 NOTE — Assessment & Plan Note (Signed)
Awaiting further eval.

## 2017-03-06 NOTE — Addendum Note (Signed)
Addended by: Pola Corn on: 03/06/2017 05:10 PM   Modules accepted: Orders

## 2017-03-06 NOTE — Progress Notes (Signed)
   Subjective:    [name], female    DOB: Jul 16, 1960, 56 y.o.   MRN: [id number]  HPI 56 yo F with hx of Hep C (tx at [facility] completed 04-2013, repeat Hep C RNA -) and HIV+ (prev Lexiva-norvir/Truvada). She had nasal septoplasty 01-05-10. She was previously changed to [name] in 2013. She was then changed to truvada-tivicay in anticipation of starting Hep C treatment.  She developed a rash on this and was then changed to issentress/truvada (which she takes with hydroxizine due to pruritis, rash).  She was changed to evotaz/truvada which her rash persisted on, then was taken off ART. Was seen December 2015 and restarted ART due to low CD4 (prezcobix/truvada).  Was hospitalized in April 2016 and found to have pituitary adenoma (being watched), addison's disease. DM2  Attributes her BP to arguing with brother (he is refusing to on HD). Has been controlled after back on norvasc for 2-3 weeks. Has been having dizziness, was to see vestibular therapist.  Wt up. Has not started metformin as her A1C was < 7%. (6.9% on 02-16-17)  HIV 1 RNA Quant (copies/mL)  Date Value  02/21/2017 31 (H)  07/12/2016 <20 DETECTED (A)  11/24/2015 <20   CD4 T Cell Abs (/uL)  Date Value  02/21/2017 810  07/12/2016 640  11/24/2015 570    Normal Mammo 05-2016 PAP 06-2016 Had optho this year.   Review of Systems  Constitutional: Positive for unexpected weight change. Negative for appetite change.  Respiratory: Positive for cough and shortness of breath.   Cardiovascular: Positive for chest pain.  Gastrointestinal: Negative for constipation and diarrhea.  Genitourinary: Negative for difficulty urinating.  Neurological: Positive for dizziness.  takes prn ntg, relieved with 1 ntg. Takes 2-3 x/month. Please see HPI. All other systems reviewed and negative.     Objective:   Physical Exam  Constitutional: She appears well-developed and well-nourished.  HENT:  Mouth/Throat: No  oropharyngeal exudate.  Eyes: EOM are normal. Pupils are equal, round, and reactive to light.  Neck: Neck supple.  Cardiovascular: Normal rate, regular rhythm and normal heart sounds.  Pulmonary/Chest: Effort normal and breath sounds normal.  Abdominal: Soft. Bowel sounds are normal. She exhibits distension. There is no tenderness. There is no rebound.  Musculoskeletal: She exhibits no edema.  Lymphadenopathy:    She has no cervical adenopathy.  Psychiatric: She has a normal mood and affect.       Assessment & Plan:

## 2017-03-06 NOTE — Assessment & Plan Note (Signed)
Will repeat her u/s since not done since 12-2014

## 2017-03-06 NOTE — Assessment & Plan Note (Signed)
Will cont to monitor appreicate pcp f/u Related to steroids?

## 2017-03-06 NOTE — Assessment & Plan Note (Signed)
She is doing well Has small blip No change in rx- biktarvy Has gotten flu vax Mening vax today Shingles vax at [place] maintenance otherwise up to date.  rtc in 9 months

## 2017-03-06 NOTE — Assessment & Plan Note (Signed)
Treated

## 2017-03-20 ENCOUNTER — Ambulatory Visit (HOSPITAL_COMMUNITY)
Admission: RE | Admit: 2017-03-20 | Discharge: 2017-03-20 | Disposition: A | Discharge status: Home / Self Care | Payer: Medicaid Other | Source: Ambulatory Visit | Attending: Infectious Diseases | Admitting: Infectious Diseases

## 2017-03-20 DIAGNOSIS — K838 Other specified diseases of biliary tract: Secondary | ICD-10-CM | POA: Diagnosis not present

## 2017-03-20 DIAGNOSIS — K769 Liver disease, unspecified: Secondary | ICD-10-CM | POA: Insufficient documentation

## 2017-03-20 DIAGNOSIS — B182 Chronic viral hepatitis C: Secondary | ICD-10-CM | POA: Diagnosis present

## 2017-03-28 ENCOUNTER — Ambulatory Visit: Payer: Medicaid Other | Admitting: Physical Medicine & Rehabilitation

## 2017-03-28 ENCOUNTER — Encounter: Payer: Medicaid Other | Attending: Physical Medicine & Rehabilitation

## 2017-03-28 ENCOUNTER — Encounter: Payer: Self-pay | Admitting: Physical Medicine & Rehabilitation

## 2017-03-28 VITALS — BP 146/88 | HR 75

## 2017-03-28 DIAGNOSIS — M47816 Spondylosis without myelopathy or radiculopathy, lumbar region: Secondary | ICD-10-CM | POA: Diagnosis not present

## 2017-03-28 MED ORDER — TRAMADOL HCL 50 MG PO TABS: 50.0000 mg | ORAL_TABLET | Freq: Two times a day (BID) | ORAL | 0 refills | Status: DC | PRN

## 2017-03-28 NOTE — Progress Notes (Signed)
RightL5 dorsal ramus., Right L4 and Right L3 medial branch radio frequency neurotomy under fluoroscopic guidance   Indication: Low back pain due to lumbar spondylosis which has been relieved on 2 occasions by greater than 50% by lumbar medial branch blocks at corresponding levels.  Informed consent was obtained after describing risks and benefits of the procedure with the patient, this includes bleeding, bruising, infection, paralysis and medication side effects. The patient wishes to proceed and has given written consent. The patient was placed in a prone position. The lumbar and sacral area was marked and prepped with Betadine. A 25-gauge 1-1/2 inch needle was inserted into the skin and subcutaneous tissue at 3 sites in one ML of 1% lidocaine was injected into each site. Then a 18-gauge 15 cm radio frequency needle with a 1 cm curved active tip was inserted targeting the Right S1 SAP/sacral ala junction. Bone contact was made and confirmed with lateral imaging.  motor stimulation at 2 Hz confirm proper needle location followed by injection of 46m 2% MPF lidocaine. Then the Right L5 SAP/transverse process junction was targeted. Bone contact was made and confirmed with lateral imaging.  motor stimulation at 2 Hz confirm proper needle location followed by injection of 145m2% MPF lidocaine. Then the Right L4 SAP/transverse process junction was targeted. Bone contact was made and confirmed with lateral imaging. motor stimulation at 2 Hz confirm proper needle location followed by injection of 63m9m% MPF lidocaine. Radio frequency lesion being [facility] 90 seconds was performed. Needles were removed. Post procedure instructions and vital signs were performed. Patient tolerated procedure well. Followup appointment was given.

## 2017-03-28 NOTE — Progress Notes (Signed)
  [facility]   Name: [name] [name] DOB:April 03, 1961 MRN: [id number]  Date:03/28/2017  Physician: Alysia Penna, MD    Nurse/CMA: Bright CMA  Allergies:  Allergies  Allergen Reactions  . Acetaminophen Other (See Comments)    Inflamed liver, hospitalized REACTION: Had liver problems, was hospitalized  . Dyazide [Hydrochlorothiazide W-Triamterene] Hives  . Lisinopril     Cough   . Triamterene Hives  . Losartan Rash    Pt had rash, worsening dizziness, and nausea after starting losartan, which improved after stopping losartan  . Morphine Hives and Rash    REACTION: rash    Consent Signed: Yes.    Is patient diabetic? No.  CBG today? NA  Pregnant: No. LMP: No LMP recorded. Patient is postmenopausal. (age 30-55)  Anticoagulants: no Anti-inflammatory: no Antibiotics: no  Procedure: right L3-5 Mbb Position: Prone   Start Time: 1040am  End Time: 1112am  Fluoro Time: 46s  RN/CMA Bright CMA Bright CMA    Time 1035am 1120am    BP 146/88 138/83    Pulse 75 76    Respirations 16 16    O2 Sat 98% 98%    S/S 6 6    Pain Level 10/10 8/10     D/C home with phyllis Sister-in-law, [name] & O X 3, D/C instructions reviewed, and sits independently.

## 2017-03-28 NOTE — Patient Instructions (Signed)
You had a radio frequency procedure today This was done to alleviate joint pain in your lumbar area We injected lidocaine which is a local anesthetic.  You may experience soreness at the injection sites. You may also experienced some irritation of the nerves that were heated I'm recommending ice for 30 minutes every 2 hours as needed for the next 24-48 hours Rx for Tramadol given for post procedure pain

## 2017-04-03 ENCOUNTER — Ambulatory Visit: Payer: Medicaid Other | Admitting: Physical Therapy

## 2017-04-24 ENCOUNTER — Encounter: Payer: Self-pay | Admitting: Physical Therapy

## 2017-04-24 ENCOUNTER — Ambulatory Visit: Payer: Medicaid Other | Attending: Internal Medicine | Admitting: Physical Therapy

## 2017-04-24 DIAGNOSIS — R42 Dizziness and giddiness: Secondary | ICD-10-CM | POA: Insufficient documentation

## 2017-04-24 NOTE — Therapy (Signed)
[facility] [address] [place], [place], [address] Phone: [phone]   Fax:  [phone]  Physical Therapy Evaluation  Patient Details  Name: [name] [name] MRN: [id number] Date of Birth: 11/08/1960 Referring Provider: Kalman Shan, DO   Encounter Date: 04/24/2017  PT End of Session - 04/24/17 1418    Visit Number  1    Number of Visits  4    Date for PT Re-Evaluation  05/25/17    Authorization Type  medicaid    PT Start Time  1020    PT Stop Time  1102    PT Time Calculation (min)  42 min       Past Medical History:  Diagnosis Date  . Allergic rhinitis 05/09/2006  . Allergy   . Anxiety   . Arthritis   . Asthma   . CHF (congestive heart failure) ([place])   . Chronic back pain   . Diabetes mellitus without complication ([place])   . GERD (gastroesophageal reflux disease)   . Heart murmur    as a child  . 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 s/p 12 week course of simeprevir and sofosbuvir between October 2014 and January 2015 with resolution.  Marland Kitchen History of shingles   . HIV infection ([place])    1994  . Hyperlipidemia    no meds taken now  . Hypertension   . Pituitary microadenoma ([place]) 08/08/2014  . Prediabetes   . Refusal of blood transfusions as patient is Jehovah's Witness   . Secondary adrenal insufficiency ([place]) 06/29/2014    Past Surgical History:  Procedure Laterality Date  . BUNIONECTOMY     b/l  . COLECTOMY     2003 for diverticulitis, had colostomy bag and then reversed  . COLONOSCOPY    . HAND SURGERY    . NASAL SINUS SURGERY    . SHOULDER SURGERY     left  . TONSILLECTOMY      There were no vitals filed for this visit.   Subjective Assessment - 04/24/17 1411    Subjective  Pt states the vertigo has progressively worsened within past 5 months; pt reports intermittent tinnitus.  Pt states she got dizzy this am when she leaned over to tie her shoes -  states she feels that she may pass out at times. Pt reports she had episode last Wed. in shower - had to get out and sit on the bed while she got herself together.     Pertinent History  Addison's disease diagnosed in 2015; HIV (+):  hepatitis C    Patient Stated Goals  Resolve the vertigo         [facility] PT Assessment - 04/24/17 1035      Assessment   Medical Diagnosis  BPPV    Referring Provider  Kalman Shan, DO    Onset Date/Surgical Date  -- June 2018      Balance Screen   Has the patient fallen in the past 6 months  No    Has the patient had a decrease in activity level because of a fear of falling?   Yes    Is the patient reluctant to leave their home because of a fear of falling?   No      Prior Function   Level of Independence  Independent      Ambulation/Gait   Ambulation/Gait  Yes    Assistive device  None  Gait Pattern  Within Functional Limits         Vestibular Assessment - 04/24/17 1048      Vestibular Assessment   General Observation  Pt is a 56 yr old lady with c/o vertigo that has progressively worsened within past 5-6 months;       Symptom Behavior   Type of Dizziness  Spinning    Frequency of Dizziness  daily    Duration of Dizziness  varies - not over 5" - sometimes secs, sometimes couple of minutes      Occulomotor Exam   Occulomotor Alignment  Normal    Spontaneous  Absent      Positional Testing   Dix-Hallpike  Dix-Hallpike Right;Dix-Hallpike Left    Sidelying Test  Sidelying Left;Sidelying Right      Dix-Hallpike Right   Dix-Hallpike Right Duration  10 secs    Dix-Hallpike Right Symptoms  No nystagmus      Dix-Hallpike Left   Dix-Hallpike Left Duration  approx. 4 secs    Dix-Hallpike Left Symptoms  No nystagmus      Sidelying Right   Sidelying Right Duration  dizziness - 10/10 intensity     Sidelying Right Symptoms  No nystagmus c/o vertigo      Sidelying Left   Sidelying Left Duration  -- up from sidelying 6-7/10    Sidelying  Left Symptoms  No nystagmus         Objective measurements completed on examination: See above findings.              PT Education - 04/24/17 1417    Education provided  Yes    Education Details  sit to sidelying    Person(s) Educated  Patient    Methods  Explanation;Demonstration;Handout    Comprehension  Verbalized understanding;Returned demonstration          PT Long Term Goals - 04/24/17 1427      PT LONG TERM GOAL #1   Title  Pt will be independent in HEP for vestibular exercises.    Time  4    Status  New    Target Date  05/25/17             Plan - 04/24/17 1418    Clinical Impression Statement  Pt is a 56 yr old female with c/o vertigo that has progressively worsened within past 5-6 months.  Pt reports vertigo can spontaneously occur and also can occur with motion.  No signs or symptoms consistent with BPPV noted with positional testing.  Pt did report vertigo in Rt sidelying position and also reported vertigo with sidelying to sitting from both right and left sides.  Pt's etiology of vertigo is unknown at this time; pt states she is in need of new tubes to be placed in her ears and also states that the dizziness may be side effect of medication she is taking.  Pt may benefit from habituation exercises to decrease motion sensitivity.              History and Personal Factors relevant to plan of care:  hepatitis C:  HIV(+); chronic LBP    Clinical Presentation  Evolving    Clinical Presentation due to:  vertigo of unknown etiology    Clinical Decision Making  Moderate    Rehab Potential  Good    PT Frequency  1x / week    PT Duration  3 weeks    PT Treatment/Interventions  ADLs/Self Care Home Management;Canalith Repostioning;Therapeutic exercise;Therapeutic  activities;Functional mobility training;Balance training;Neuromuscular re-education;Patient/family education;Vestibular    PT Next Visit Plan  re-assess vertigo    PT Home Exercise Plan  sit to  sidelying    Recommended Other Services  pt report she is making appt with her ENT regarding tubes for her ears    Consulted and Agree with Plan of Care  Patient;Family member/caregiver       Patient will benefit from skilled therapeutic intervention in order to improve the following deficits and impairments:  Dizziness  Visit Diagnosis: Dizziness and giddiness - Plan: PT plan of care cert/re-cert     Problem List Patient Active Problem List   Diagnosis Date Noted  . Vertigo of central origin 01/23/2017  . Need for immunization against influenza 01/23/2017  . Left hand pain 10/27/2016  . Ganglion cyst 10/06/2016  . 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 BPPV (benign paroxysmal positional vertigo) 10/02/2015  . Liver fibrosis ([place]) 12/04/2014  . Pituitary microadenoma ([place]) 08/08/2014  . 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 Vitamin D deficiency 06/29/2014  . Secondary adrenal insufficiency ([place]) 06/29/2014  . History of tympanostomy tube placement 02/07/2014  . Refusal of blood transfusions as patient is Jehovah's Witness 11/18/2013  . Chronic hepatitis C without hepatic coma ([place]) 03/16/2012  . Postherpetic neuralgia 12/15/2011  . Health care maintenance 12/15/2011  . Chest pain 05/31/2011  . Fatigue 05/18/2011  . Opioid dependence ([place]) 10/05/2010  . HTN (hypertension) 10/05/2010  . Hyperlipidemia associated with type 2 diabetes mellitus ([place]) 10/23/2008  . INSOMNIA 02/14/2007  . HIV disease ([place]) 05/09/2006  . Depression 05/09/2006  . Allergic rhinitis 05/09/2006  . GERD 05/09/2006    DildayJenness Corner, PT 04/24/2017, 2:32 PM  [facility] [address] [place], [place], [address] Phone: [phone]   Fax:  [phone]  Name: KJIRSTEN BLOODGOOD MRN: [id number] Date of Birth: 05-28-1960

## 2017-04-24 NOTE — Patient Instructions (Signed)
Sit to Side-Lying    Sit on edge of bed. 1. Turn head 45 to right. 2. Maintain head position and lie down slowly on left side. Hold until symptoms subside. 3. Sit up slowly. Hold until symptoms subside. 4. Turn head 45 to left. 5. Maintain head position and lie down slowly on right side. Hold until symptoms subside. 6. Sit up slowly. Repeat sequence __5__ times per session. Do _3___ sessions per day.  Copyright  VHI. All rights reserved.  Tip Card  1.The goal of habituation training is to assist in decreasing symptoms of vertigo, dizziness, or nausea provoked by specific head and body motions. 2.These exercises may initially increase symptoms; however, be persistent and work through symptoms. With repetition and time, the exercises will assist in reducing or eliminating symptoms. 3.Exercises should be stopped and discussed with the therapist if you experience any of the following: - Sudden change or fluctuation in hearing - New onset of ringing in the ears, or increase in current intensity - Any fluid discharge from the ear - Severe pain in neck or back - Extreme nausea  Copyright  VHI. All rights reserved.

## 2017-05-08 ENCOUNTER — Ambulatory Visit: Payer: Medicaid Other | Attending: Internal Medicine | Admitting: Physical Therapy

## 2017-05-08 DIAGNOSIS — R42 Dizziness and giddiness: Secondary | ICD-10-CM | POA: Diagnosis not present

## 2017-05-09 ENCOUNTER — Encounter: Payer: Self-pay | Admitting: Physical Therapy

## 2017-05-09 NOTE — Therapy (Signed)
[name] [name] [address] [place], [place], [address] Phone: [phone]   Fax:  [phone]  Physical Therapy Treatment  Patient Details  Name: [name] [name] MRN: [id number] Date of Birth: May 17, 1960 Referring Provider: Kalman Shan, DO   Encounter Date: 05/08/2017  PT End of Session - 05/09/17 2008    Visit Number  2    Number of Visits  4    Date for PT Re-Evaluation  05/25/17    Authorization Type  medicaid    PT Start Time  1146    PT Stop Time  1212 session ended early due to D/C    PT Time Calculation (min)  26 min       Past Medical History:  Diagnosis Date  . Allergic rhinitis 05/09/2006  . Allergy   . Anxiety   . Arthritis   . Asthma   . CHF (congestive heart failure) ([place])   . Chronic back pain   . Diabetes mellitus without complication ([place])   . GERD (gastroesophageal reflux disease)   . Heart murmur    as a child  . 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 s/p 12 week course of simeprevir and sofosbuvir between October 2014 and January 2015 with resolution.  Marland Kitchen History of shingles   . HIV infection ([place])    1994  . Hyperlipidemia    no meds taken now  . Hypertension   . Pituitary microadenoma ([place]) 08/08/2014  . Prediabetes   . Refusal of blood transfusions as patient is Jehovah's Witness   . Secondary adrenal insufficiency ([place]) 06/29/2014    Past Surgical History:  Procedure Laterality Date  . BUNIONECTOMY     b/l  . COLECTOMY     2003 for diverticulitis, had colostomy bag and then reversed  . COLONOSCOPY    . HAND SURGERY    . NASAL SINUS SURGERY    . SHOULDER SURGERY     left  . TONSILLECTOMY      There were no vitals filed for this visit.  Subjective Assessment - 05/09/17 2004    Subjective  Pt states she has episodes everyday - lasts for just few seconds; states the exercises are keeping the dizziness under control; states she thinks if  she could have the tubes in her ear placed back in correctly the dizziness would be improved     Pertinent History  Addison's disease diagnosed in 2015; HIV (+):  hepatitis C    Patient Stated Goals  Resolve the vertigo    Currently in Pain?  Yes    Pain Score  7     Pain Location  Back    Pain Orientation  Lower    Pain Descriptors / Indicators  Aching    Pain Type  Chronic pain             Vestibular Assessment - 05/09/17 0001      Sidelying Right   Sidelying Right Duration  none    Sidelying Right Symptoms  No nystagmus      Sidelying Left   Sidelying Left Duration  none    Sidelying Left Symptoms  No nystagmus                           PT Long Term Goals - 05/09/17 2009      PT LONG TERM GOAL #1   Title  Pt will  be independent in HEP for vestibular exercises.    Status  Achieved            Plan - 05/09/17 2009    Clinical Impression Statement  Pt has met LTG #1: no nystagmus noted with any positional testing.  Pt reports she continues to have mild vertigo with quick movements but states the exercise helps to keep it under control.  No nystagmus noted with any positional testing with no signs of BPPV noted at this time.  Pt attributes vertigo to possibly be due to problem with her ears with need for new tubes and/or possibly due to side effect of some of her medications.      Rehab Potential  Good    PT Frequency  1x / week    PT Duration  3 weeks    PT Treatment/Interventions  ADLs/Self Care Home Management;Canalith Repostioning;Therapeutic exercise;Therapeutic activities;Functional mobility training;Balance training;Neuromuscular re-education;Patient/family education;Vestibular    PT Next Visit Plan  D/C on 05-08-17    PT Home Exercise Plan  sit to sidelying    Consulted and Agree with Plan of Care  Patient       Patient will benefit from skilled therapeutic intervention in order to improve the following deficits and impairments:   Dizziness  Visit Diagnosis: Dizziness and giddiness     Problem List Patient Active Problem List   Diagnosis Date Noted  . Vertigo of central origin 01/23/2017  . Need for immunization against influenza 01/23/2017  . Left hand pain 10/27/2016  . Ganglion cyst 10/06/2016  . 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 BPPV (benign paroxysmal positional vertigo) 10/02/2015  . Liver fibrosis ([place]) 12/04/2014  . Pituitary microadenoma ([place]) 08/08/2014  . 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 Vitamin D deficiency 06/29/2014  . Secondary adrenal insufficiency ([place]) 06/29/2014  . History of tympanostomy tube placement 02/07/2014  . Refusal of blood transfusions as patient is Jehovah's Witness 11/18/2013  . Chronic hepatitis C without hepatic coma ([place]) 03/16/2012  . Postherpetic neuralgia 12/15/2011  . Health care maintenance 12/15/2011  . Chest pain 05/31/2011  . Fatigue 05/18/2011  . Opioid dependence ([place]) 10/05/2010  . HTN (hypertension) 10/05/2010  . Hyperlipidemia associated with type 2 diabetes mellitus ([place]) 10/23/2008  . INSOMNIA 02/14/2007  . HIV disease ([place]) 05/09/2006  . Depression 05/09/2006  . Allergic rhinitis 05/09/2006  . GERD 05/09/2006     PHYSICAL THERAPY DISCHARGE SUMMARY  Visits from Start of Care:  2  Current functional level related to goals / functional outcomes: Pt has met LTG #1  NO SIGNS OF BPPV NOTED AT THIS TIME (NOR AT TIME OF EVAL- ETIOLOGY OF VERTIGO is UNKNOWN)  Remaining deficits:   Pt continues to c/o dizziness with quick turns or head movements - states it occurs daily but lasts only seconds to couple of minutes    Education / Equipment: Pt has been instructed in habituation exercise of sit to/from sidelying; Plan: Patient agrees to discharge.  Patient goals were met. Patient is being discharged due to meeting the stated rehab goals.  ?????       Pt attributes dizziness to  need for new tubes in her ears and/or possibly due to side effect of some of her medications.   [name] [name], PT 05/09/2017, 8:16 PM  [facility] [address] [place], [place], [address] Phone: [phone]   Fax:  [phone]  Name: [name] [name] MRN: [id number] Date of Birth:  11/04/1960   

## 2017-05-16 ENCOUNTER — Encounter: Payer: Medicaid Other | Admitting: Physical Therapy

## 2017-05-18 ENCOUNTER — Encounter: Payer: Self-pay | Admitting: Pharmacist

## 2017-05-18 ENCOUNTER — Other Ambulatory Visit: Payer: Self-pay

## 2017-05-18 ENCOUNTER — Ambulatory Visit: Payer: Medicaid Other | Admitting: Internal Medicine

## 2017-05-18 ENCOUNTER — Encounter: Payer: Self-pay | Admitting: Internal Medicine

## 2017-05-18 VITALS — BP 139/76 | HR 81 | Temp 98.0°F | Ht 61.0 in | Wt 180.5 lb

## 2017-05-18 DIAGNOSIS — Z87891 Personal history of nicotine dependence: Secondary | ICD-10-CM | POA: Diagnosis not present

## 2017-05-18 DIAGNOSIS — E2740 Unspecified adrenocortical insufficiency: Secondary | ICD-10-CM | POA: Diagnosis not present

## 2017-05-18 DIAGNOSIS — H6982 Other specified disorders of Eustachian tube, left ear: Secondary | ICD-10-CM | POA: Diagnosis not present

## 2017-05-18 DIAGNOSIS — T380X5A Adverse effect of glucocorticoids and synthetic analogues, initial encounter: Principal | ICD-10-CM

## 2017-05-18 DIAGNOSIS — Z79899 Other long term (current) drug therapy: Secondary | ICD-10-CM | POA: Diagnosis not present

## 2017-05-18 DIAGNOSIS — Z888 Allergy status to other drugs, medicaments and biological substances status: Secondary | ICD-10-CM | POA: Diagnosis not present

## 2017-05-18 DIAGNOSIS — E099 Drug or chemical induced diabetes mellitus without complications: Secondary | ICD-10-CM | POA: Diagnosis not present

## 2017-05-18 DIAGNOSIS — I10 Essential (primary) hypertension: Secondary | ICD-10-CM

## 2017-05-18 DIAGNOSIS — H6992 Unspecified Eustachian tube disorder, left ear: Secondary | ICD-10-CM

## 2017-05-18 LAB — GLUCOSE, CAPILLARY: GLUCOSE-CAPILLARY: 97 mg/dL (ref 65–99)

## 2017-05-18 LAB — POCT GLYCOSYLATED HEMOGLOBIN (HGB A1C): HEMOGLOBIN A1C: 6.9

## 2017-05-18 MED ORDER — DIAZEPAM 5 MG PO TABS: ORAL_TABLET | ORAL | 0 refills | Status: DC

## 2017-05-18 NOTE — Progress Notes (Signed)
   CC: follow up for essential hypertension   HPI:  [name] is a 57 y.o. female with history noted below that presents to the internal medicine clinic for follow-up on essential hypertension. Please see problem based charting for the status of patient's chronic medical conditions.  Past Medical History:  Diagnosis Date  . Allergic rhinitis 05/09/2006  . Allergy   . Anxiety   . Arthritis   . Asthma   . CHF (congestive heart failure) ([place])   . Chronic back pain   . Diabetes mellitus without complication ([place])   . GERD (gastroesophageal reflux disease)   . Heart murmur    as a child  . 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 s/p 12 week course of simeprevir and sofosbuvir between October 2014 and January 2015 with resolution.  Marland Kitchen History of shingles   . HIV infection ([place])    1994  . Hyperlipidemia    no meds taken now  . Hypertension   . Pituitary microadenoma ([place]) 08/08/2014  . Prediabetes   . Refusal of blood transfusions as patient is Jehovah's Witness   . Secondary adrenal insufficiency ([facility]) 06/29/2014    Review of Systems:  Review of Systems  Constitutional: Negative for malaise/fatigue.  HENT: Positive for hearing loss. Negative for ear discharge, ear pain and tinnitus.   Respiratory: Negative for shortness of breath.   Cardiovascular: Negative for chest pain.  Gastrointestinal: Negative for nausea and vomiting.  Neurological: Positive for dizziness.     Physical Exam:  Vitals:   05/18/17 1558 05/18/17 1634  BP: (!) 155/80 139/76  Pulse: 95 81  Temp: 98 F (36.7 C)   TempSrc: Oral   SpO2: 98%   Weight: 180 lb 8 oz (81.9 kg)   Height: 5\' 1"  (1.549 m)    Physical Exam  Constitutional: She is well-developed, well-nourished, and in no distress.  HENT:  Tube noted in right ear.  No tube in left ear  Cardiovascular: Normal rate, regular rhythm and normal heart sounds. Exam reveals no gallop and no friction rub.  No  murmur heard. Pulmonary/Chest: Effort normal and breath sounds normal. No respiratory distress. She has no wheezes. She has no rales.    Assessment & Plan:   See encounters tab for problem based medical decision making.    Patient discussed with Dr. Beryle Beams

## 2017-05-18 NOTE — Patient Instructions (Addendum)
[name] [name],  Is a pleasure seeing you today. A referral to ENT has been made. Please follow up in 6 months or sooner if needed.

## 2017-05-19 DIAGNOSIS — H6992 Unspecified Eustachian tube disorder, left ear: Secondary | ICD-10-CM | POA: Insufficient documentation

## 2017-05-19 NOTE — Assessment & Plan Note (Signed)
Assessment:  Eustacian tube dysfunction  States tube has fallen out of left ear and would like a referral back to ENT as patient's hearing is muffled.   Plan -referral to ENT

## 2017-05-19 NOTE — Assessment & Plan Note (Signed)
Assessment: Steroid-induced diabetes mellitus Patient takes Cortef for adrenal insufficiency. Last hemoglobin A1c was 6.9 on 01/2017.  Today it is 6.9 Patient was started on metformin and titrated to 500 mg twice a day at that time.  Patient states she has not taken metformin per recommendation of her endocrinologist.   She states she was told to start medication if Hgb A1C was above 7.1.  Will need to get records from office   Plan -repeat hemoglobin A1C in 3 month

## 2017-05-19 NOTE — Assessment & Plan Note (Signed)
Assessment:  Essential hypertension Patient currently takes metoprolol 25mg  BID and amlodipine 10mg  daily.  She reports allergy of facial rash with losartan, dizzines on HCTZ and cough with lisinopril.  Patient states she has tried spironalactone and states she had a side effect but can not recall what happened.  Today/pressure is 155/80 on repeat it is 139/76 stable . She stated she did not want to start any blood pressure medications today.  Plan -Continue metoprolol 25mg  BID and amlodipine 10mg  daily

## 2017-05-22 NOTE — Progress Notes (Signed)
Medicine attending: Medical history, presenting problems, physical findings, and medications, reviewed with resident physician Dr Jessica Hoffman on the day of the patient visit and I concur with her evaluation and management plan. 

## 2017-05-25 ENCOUNTER — Encounter: Payer: Medicaid Other | Admitting: Physical Therapy

## 2017-06-26 ENCOUNTER — Encounter: Payer: Medicaid Other | Attending: Physical Medicine & Rehabilitation

## 2017-06-26 ENCOUNTER — Ambulatory Visit: Payer: Medicaid Other | Admitting: Physical Medicine & Rehabilitation

## 2017-06-26 ENCOUNTER — Encounter: Payer: Self-pay | Admitting: Physical Medicine & Rehabilitation

## 2017-06-26 VITALS — BP 140/90 | HR 75 | Resp 14

## 2017-06-26 DIAGNOSIS — M47816 Spondylosis without myelopathy or radiculopathy, lumbar region: Secondary | ICD-10-CM | POA: Diagnosis not present

## 2017-06-26 MED ORDER — TRAMADOL HCL 50 MG PO TABS: 50.0000 mg | ORAL_TABLET | Freq: Two times a day (BID) | ORAL | 0 refills | Status: DC | PRN

## 2017-06-26 NOTE — Patient Instructions (Signed)
I suspect the left sided pain related to arthritis will need to do medial branch blocks in 2 wks

## 2017-06-26 NOTE — Progress Notes (Signed)
Subjective:    [name], female    DOB: [birth year], 57 y.o.   MRN: [id number]  HPI 57 year old female with history of primarily right-sided chronic low back pain.  She has had good results with right L5 dorsal ramus right L3-L4 medial branch radiofrequency neurotomy.  Was last performed 03/28/2017, and prior to that 09/23/2016.  She has obtained 6-12 months relief from radiofrequency neurotomies on the right side.  She is not experiencing any right-sided low back pain.  Chief complaint is left-sided low back pain.  Feels similar in location to the right side. She has had no falls or trauma to the area.  She has had no pain radiating to the lower extremity.  No weakness or numbness in the left lower extremity.  No bowel or bladder dysfunction.    Pain Inventory Average Pain 5 Pain Right Now 9 My pain is sharp, dull, stabbing and aching  In the last 24 hours, has pain interfered with the following? General activity 10 Relation with others 8 Enjoyment of life 10 What TIME of day is your pain at its worst? daytime , evening, night Sleep (in general) Poor  Pain is worse with: walking, bending and standing Pain improves with: heat/ice, medication and injections Relief from Meds: 1  Mobility walk without assistance walk with assistance use a cane how many minutes can you walk? 15 ability to climb steps?  yes do you drive?  no transfers alone Do you have any goals in this area?  yes  Function disabled: date disabled . I need assistance with the following:  household duties and shopping Do you have any goals in this area?  yes  Neuro/Psych weakness trouble walking dizziness anxiety  Prior Studies Any changes since last visit?  no  Physicians involved in your care Any changes since last visit?  no   Family History  Problem Relation Age of Onset  . Heart disease Mother   . Diabetes Mother   . Stroke Mother   . Heart disease Father   . Stroke Father     . Diabetes Father   . Hepatitis Sister        hcv  . Stroke Other   . Colon polyps Brother   . Cancer Sister        lung  . Cancer Maternal Aunt   . Cancer Maternal Aunt   . Colon cancer Neg Hx   . Esophageal cancer Neg Hx   . Stomach cancer Neg Hx   . Rectal cancer Neg Hx    Social History   Socioeconomic History  . Marital status: Single    Spouse name: None  . Number of children: 0  . Years of education: None  . Highest education level: None  Social Needs  . Financial resource strain: None  . Food insecurity - worry: None  . Food insecurity - inability: None  . Transportation needs - medical: None  . Transportation needs - non-medical: None  Occupational History  . None  Tobacco Use  . Smoking status: Former Smoker    Years: 20.00    Types: Cigarettes    Last attempt to quit: 04/26/2007    Years since quitting: 10.1  . Smokeless tobacco: Never Used  . Tobacco comment: QUIT 2009  Substance and Sexual Activity  . Alcohol use: No    Alcohol/week: 0.0 oz  . Drug use: No  . Sexual activity: None    Comment: pt. given condoms  Other Topics  Concern  . None  Social History Narrative   Alternate # [id number]   Past Surgical History:  Procedure Laterality Date  . BUNIONECTOMY     b/l  . COLECTOMY     2003 for diverticulitis, had colostomy bag and then reversed  . COLONOSCOPY    . HAND SURGERY    . NASAL SINUS SURGERY    . SHOULDER SURGERY     left  . TONSILLECTOMY     Past Medical History:  Diagnosis Date  . Allergic rhinitis 05/09/2006  . Allergy   . Anxiety   . Arthritis   . Asthma   . CHF (congestive heart failure) ([place])   . Chronic back pain   . Diabetes mellitus without complication ([place])   . GERD (gastroesophageal reflux disease)   . Heart murmur    as a child  . 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 s/p 12 week course of simeprevir and sofosbuvir between October 2014 and January 2015 with resolution.   Marland Kitchen History of shingles   . HIV infection ([place])    1994  . Hyperlipidemia    no meds taken now  . Hypertension   . Pituitary microadenoma ([place]) 08/08/2014  . Prediabetes   . Refusal of blood transfusions as patient is Jehovah's Witness   . Secondary adrenal insufficiency ([place]) 06/29/2014   There were no vitals taken for this visit.  Opioid Risk Score:   Fall Risk Score:  `1  Depression screen PHQ 2/9  Depression screen [facility] 2/9 05/18/2017 03/06/2017 02/16/2017 01/23/2017 01/03/2017 12/22/2016 10/06/2016  Decreased Interest 0 0 0 0 0 0 0  Down, Depressed, Hopeless 0 0 0 0 0 0 0  PHQ - 2 Score 0 0 0 0 0 0 0  Altered sleeping - - - - - - -  Tired, decreased energy - - - - - - -  Change in appetite - - - - - - -  Feeling bad or failure about yourself  - - - - - - -  Trouble concentrating - - - - - - -  Moving slowly or fidgety/restless - - - - - - -  Suicidal thoughts - - - - - - -  PHQ-9 Score - - - - - - -  Some recent data might be hidden    Review of Systems  Constitutional: Positive for appetite change, diaphoresis and unexpected weight change.  Eyes: Negative.   Respiratory: Positive for shortness of breath.   Gastrointestinal: Positive for nausea.  Endocrine: Negative.   Genitourinary: Negative.   Musculoskeletal: Positive for arthralgias, back pain and gait problem.  Skin: Negative.   Allergic/Immunologic: Negative.   Neurological: Positive for dizziness and weakness.  Psychiatric/Behavioral: The patient is nervous/anxious.        Objective:   Physical Exam  Constitutional: She is oriented to person, place, and time. She appears well-developed and well-nourished. No distress.  HENT:  Head: Normocephalic and atraumatic.  Eyes: Conjunctivae and EOM are normal. Pupils are equal, round, and reactive to light.  Neck: Normal range of motion.  Neurological: She is alert and oriented to person, place, and time.  Skin: She is not diaphoretic.  Psychiatric: She has a normal  mood and affect.  Nursing note and vitals reviewed.   General no acute distress Mood and affect are appropriate Negative straight leg raising bilaterally She has tenderness palpation left L4-L5 S1 paraspinal area.  No tenderness on the right  side. She has mild tenderness over the greater trochanter of the hips bilaterally Motor strength is 5/5 bilateral hip flexor knee extensor ankle dorsiflexor Gait is forward flexed no evidence of toe drag or knee instability.       Assessment & Plan:  1.  Lumbar spondylosis without myelopathy.  She has been primarily symptomatic on the right side however that pain has been relieved byRepeat L3-L4 medial branch and L5 dorsal ramus radiofrequency neurotomy It is likely that her left-sided symptoms are from the left L4-L5 S1 lumbar facet joint complexes.  Therefore will recommend L3-L4 medial branch and L5 dorsal ramus injection under fluoroscopic guidance. Continue as needed tramadol she can take 1-2 tablets/day as needed she does not take them every day rather every other day and 40 tablets last her more than 1 month.

## 2017-07-13 ENCOUNTER — Ambulatory Visit: Payer: Medicaid Other | Admitting: Physical Medicine & Rehabilitation

## 2017-07-13 ENCOUNTER — Encounter: Payer: Self-pay | Admitting: Physical Medicine & Rehabilitation

## 2017-07-13 VITALS — BP 134/86 | HR 76 | Resp 14

## 2017-07-13 DIAGNOSIS — M47816 Spondylosis without myelopathy or radiculopathy, lumbar region: Secondary | ICD-10-CM | POA: Diagnosis not present

## 2017-07-13 NOTE — Progress Notes (Signed)
Left Lumbar L3, L4  medial branch blocks and L 5 dorsal ramus injection under fluoroscopic guidance   Indication: Left Lumbar pain which is not relieved by medication management or other conservative care and interfering with self-care and mobility.  Informed consent was obtained after describing risks and benefits of the procedure with the patient, this includes bleeding, bruising, infection, paralysis and medication side effects.  The patient wishes to proceed and has given written consent.  The patient was placed in a prone position.  The lumbar area was marked and prepped with Betadine.  One mL of 1% lidocaine was injected into each of 3 areas into the skin and subcutaneous tissue.  Then a 22-gauge 5in spinal needle was inserted targeting the junction of the left S1 superior articular process and sacral ala junction.  Needle was advanced under fluoroscopic guidance.  Bone contact was made. Isovue 200 was injected x 0.5 mL demonstrating no intravascular uptake.  Then a solution containing  2% MPF lidocaine was injected x 0.5 mL.  Then the left L5 superior articular process in transverse process junction was targeted.  Bone contact was made. Isovue 200 was injected x 0.5 mL demonstrating no intravascular uptake.  Then a solution containing 2% MPF lidocaine was injected x 0.5 mL.  Then the left L4 superior articular process in transverse process junction was targeted.  Bone contact was made.  Isovue 200 was injected x 0.5 mL demonstrating no intravascular uptake.  Then a solution containing  2% MPF lidocaine was injected x 0.5 mL.  Patient tolerated procedure well.  Post procedure instructions were given.  

## 2017-07-13 NOTE — Patient Instructions (Signed)

## 2017-07-13 NOTE — Progress Notes (Signed)
  PROCEDURE RECORD [facility]   Name: CHEVON FOMBY DOB:[birth year] MRN: [id number]  Date:07/13/2017  Physician: Alysia Penna, MD    Nurse/CMA: Jaizon Deroos, CMA   Allergies:  Allergies  Allergen Reactions  . Acetaminophen Other (See Comments)    Inflamed liver, hospitalized REACTION: Had liver problems, was hospitalized  . Dyazide [Hydrochlorothiazide W-Triamterene] Hives  . Lisinopril     Cough   . Triamterene Hives  . Losartan Rash    Pt had rash, worsening dizziness, and nausea after starting losartan, which improved after stopping losartan  . Morphine Hives and Rash    REACTION: rash    Consent Signed: Yes.    Is patient diabetic? No.  CBG today?   Pregnant: No. LMP: No LMP recorded. Patient is postmenopausal. (age 57-55)  Anticoagulants: no Anti-inflammatory: no Antibiotics: no  Procedure: left L3,4,5 medial branch block  Position: Prone Start Time: 1:22 pm  End Time:  1:29pm       Fluoro Time: 30  RN/CMA Domenick Quebedeaux, CMA Lynae Pederson, CMA    Time 1:10pm 1:33pm    BP 134/86 120/73    Pulse 76 87    Respirations 14 14    O2 Sat 96 97    S/S 6 6    Pain Level 8/10 7/10     D/C home with Kendal Hymen , [name] & O X 3, D/C instructions reviewed, and sits independently.

## 2017-07-18 ENCOUNTER — Other Ambulatory Visit: Payer: Self-pay | Admitting: Internal Medicine

## 2017-07-18 DIAGNOSIS — Z1231 Encounter for screening mammogram for malignant neoplasm of breast: Secondary | ICD-10-CM

## 2017-07-21 ENCOUNTER — Ambulatory Visit: Payer: Medicaid Other | Admitting: Internal Medicine

## 2017-07-21 ENCOUNTER — Other Ambulatory Visit: Payer: Self-pay

## 2017-07-21 VITALS — BP 132/79 | HR 80 | Temp 98.5°F | Ht 61.0 in | Wt 178.4 lb

## 2017-07-21 DIAGNOSIS — Z7982 Long term (current) use of aspirin: Secondary | ICD-10-CM

## 2017-07-21 DIAGNOSIS — Z21 Asymptomatic human immunodeficiency virus [HIV] infection status: Secondary | ICD-10-CM | POA: Diagnosis not present

## 2017-07-21 DIAGNOSIS — Z79899 Other long term (current) drug therapy: Secondary | ICD-10-CM

## 2017-07-21 DIAGNOSIS — Z9622 Myringotomy tube(s) status: Secondary | ICD-10-CM | POA: Diagnosis not present

## 2017-07-21 DIAGNOSIS — Z8619 Personal history of other infectious and parasitic diseases: Secondary | ICD-10-CM

## 2017-07-21 DIAGNOSIS — E2740 Unspecified adrenocortical insufficiency: Secondary | ICD-10-CM | POA: Diagnosis not present

## 2017-07-21 DIAGNOSIS — J302 Other seasonal allergic rhinitis: Secondary | ICD-10-CM

## 2017-07-21 MED ORDER — SALINE SPRAY 0.65 % NA SOLN: 1.0000 | NASAL | 1 refills | Status: DC | PRN

## 2017-07-21 MED ORDER — MONTELUKAST SODIUM 10 MG PO TABS: 10.0000 mg | ORAL_TABLET | Freq: Every day | ORAL | 6 refills | Status: DC

## 2017-07-21 MED ORDER — CETIRIZINE HCL 10 MG PO TABS: ORAL_TABLET | ORAL | 3 refills | Status: DC

## 2017-07-21 NOTE — Progress Notes (Addendum)
   CC: Allergies follow up  HPI:  [name] [name] is a 57 y.o. with PMH of HIV on ART, asthma, seasonal allergies, treated hepatitis C, and adrenal insufficiency on chronic steroids who is presenting for evaluation of 1 month history of sneezing, dry coughing, itchy, watery eyes, and congestion. Patient states that she has experienced this constellation of symptoms in the past with springtime weather, but they have been well managed on regimen of nasal saline, Zyrtec, and Singulair. This spring, however, she finds the symptoms to be overwhelming and worse than she remembered in the past on above medication regiemn. Her symptoms occur whenever she goes outside and she notices that use of the air conditioner while inside causes her symptoms as well. Over the past few days she has noticed a frontal headache which occurs whenever she goes outside for long periods of time and don't go away until she gets back inside. She states that she has been told she is allergic to pollen in the past.   Past Medical History: Past Medical History:  Diagnosis Date  . Allergic rhinitis 05/09/2006  . Allergy   . Anxiety   . Arthritis   . Asthma   . CHF (congestive heart failure) ([place])   . Chronic back pain   . Diabetes mellitus without complication ([place])   . GERD (gastroesophageal reflux disease)   . Heart murmur    as a child  . 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 s/p 12 week course of simeprevir and sofosbuvir between October 2014 and January 2015 with resolution.  Marland Kitchen History of shingles   . HIV infection ([place])    1994  . Hyperlipidemia    no meds taken now  . Hypertension   . Pituitary microadenoma ([place]) 08/08/2014  . Prediabetes   . Refusal of blood transfusions as patient is Jehovah's Witness   . Secondary adrenal insufficiency ([place]) 06/29/2014   Review of Systems:  Patient endorses congestion, cough, sneezing, and dry,itchy eyes, as per HPI Patient denies chest  pain, shortness of breath, abdominal pain, diaphoresis, nausea/vomiting, lower extremity swelling, and change in bowel/bladder habits.  Physical Exam:  Vitals:   07/21/17 1359  BP: 132/79  Pulse: 80  Temp: 98.5 F (36.9 C)  TempSrc: Oral  SpO2: 98%  Weight: 178 lb 6.4 oz (80.9 kg)  Height: 5\' 1"  (1.549 m)   Physical Exam  Constitutional: She is well-developed, well-nourished, and in no distress.  HENT:  Mouth/Throat: Oropharynx is clear and moist. No oropharyngeal exudate.  Tube in place in R ear. Clear L tympanic membrane with tear in membrane and scars throughout intact membrane. NO erythema or effusions noted.  Cardiovascular: Normal rate and regular rhythm. Exam reveals no friction rub.  No murmur heard. Pulmonary/Chest: Effort normal. No respiratory distress. She has no wheezes. She has no rales.  Abdominal: Soft. She exhibits no distension. There is no tenderness. There is no rebound.  Musculoskeletal: She exhibits no edema (of bilateral lower extremities) or tenderness (of bilateral lower extremities).  Lymphadenopathy:    She has no cervical adenopathy.  Psychiatric: Affect and judgment normal.   Assessment & Plan:   See Encounters Tab for problem based charting.  Patient discussed with Dr. Beryle Beams   Medicine attending: Medical history, presenting problems, physical findings, and medications, reviewed with resident physician Dr Thomasene Ripple on the day of the patient visit and I concur with her evaluation and management plan.

## 2017-07-21 NOTE — Assessment & Plan Note (Signed)
Patient does not have signs/symptoms of infection on physical exam. Her clinical history and presentation is consistent with seasonal allergies as a cause of her symptoms. Patient already on Zyrtec and Singulair, along with nasal saline to help with congestion. Patient was told by allergy office that she cannot see her previous allergist without a referral. Plan to continue current allergy medications and make referral today.  Plan: -Continue Singulair, Zyrtec, and nasal saline for symptomatic relief of seasonal allergies. -Face mask provided to help with reduction of allergy exposure while outside -Referral to Allergy

## 2017-07-21 NOTE — Patient Instructions (Signed)
Thank you for seeing [place] in the clinic today!  You were evaluated for worsening of your seasonal allergies. We have placed a referral for the allergist given your worsening symptoms. Please continue taking Singulair and Zyrtec while you are waiting to see these physicians.   Please return to the clinic in 3 months for follow up of your chronic medical conditions.   If you have any questions or concerns, please call our clinic at [phone] between the hours of 9am-5pm. If you have a problem after these hours, please call [phone] and ask for the internal medicine resident on call. If you feel you are having a medical emergency please call 911.   Thanks, Dr. Larena Glassman Aleksei Goodlin

## 2017-07-24 ENCOUNTER — Other Ambulatory Visit: Payer: Self-pay | Admitting: *Deleted

## 2017-07-24 MED ORDER — AMLODIPINE BESYLATE 10 MG PO TABS: 10.0000 mg | ORAL_TABLET | Freq: Every day | ORAL | 3 refills | Status: DC

## 2017-07-28 ENCOUNTER — Telehealth: Payer: Self-pay | Admitting: *Deleted

## 2017-07-28 NOTE — Telephone Encounter (Signed)
Prior authorization submitted and approved for tramadol 50 mg tabs

## 2017-08-05 ENCOUNTER — Other Ambulatory Visit: Payer: Self-pay | Admitting: Infectious Diseases

## 2017-08-05 DIAGNOSIS — B20 Human immunodeficiency virus [HIV] disease: Secondary | ICD-10-CM

## 2017-08-06 IMAGING — MG DIGITAL SCREENING BILATERAL MAMMOGRAM WITH CAD
4 series · 4 of 4 positions shown · non-contrast
Comparison: Previous exam(s).

CLINICAL DATA: Screening.

EXAM:
DIGITAL SCREENING BILATERAL MAMMOGRAM WITH CAD

[R CC]
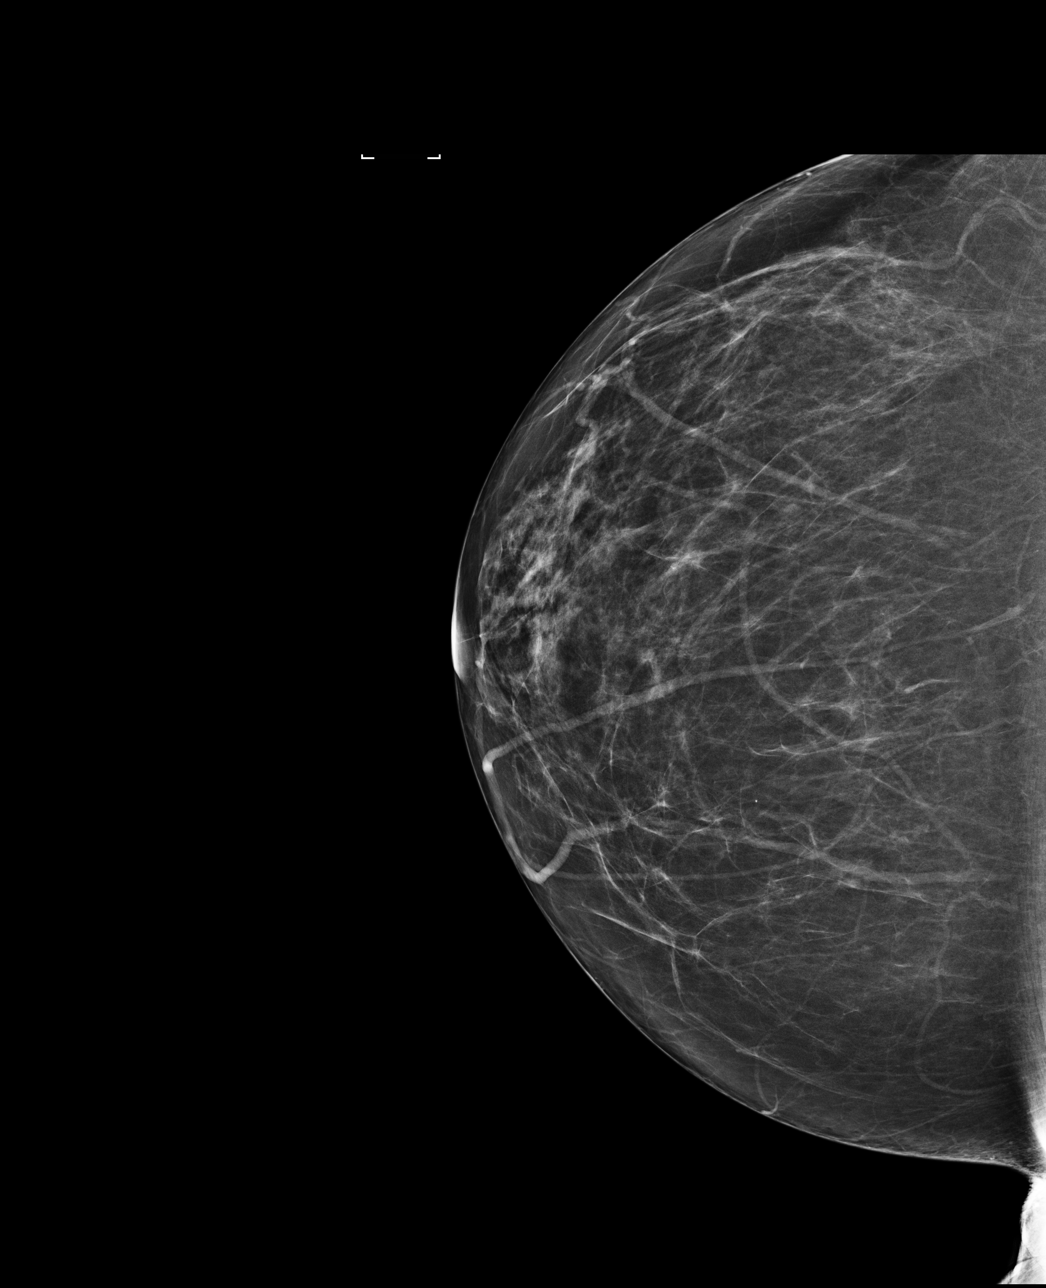

[L CC]
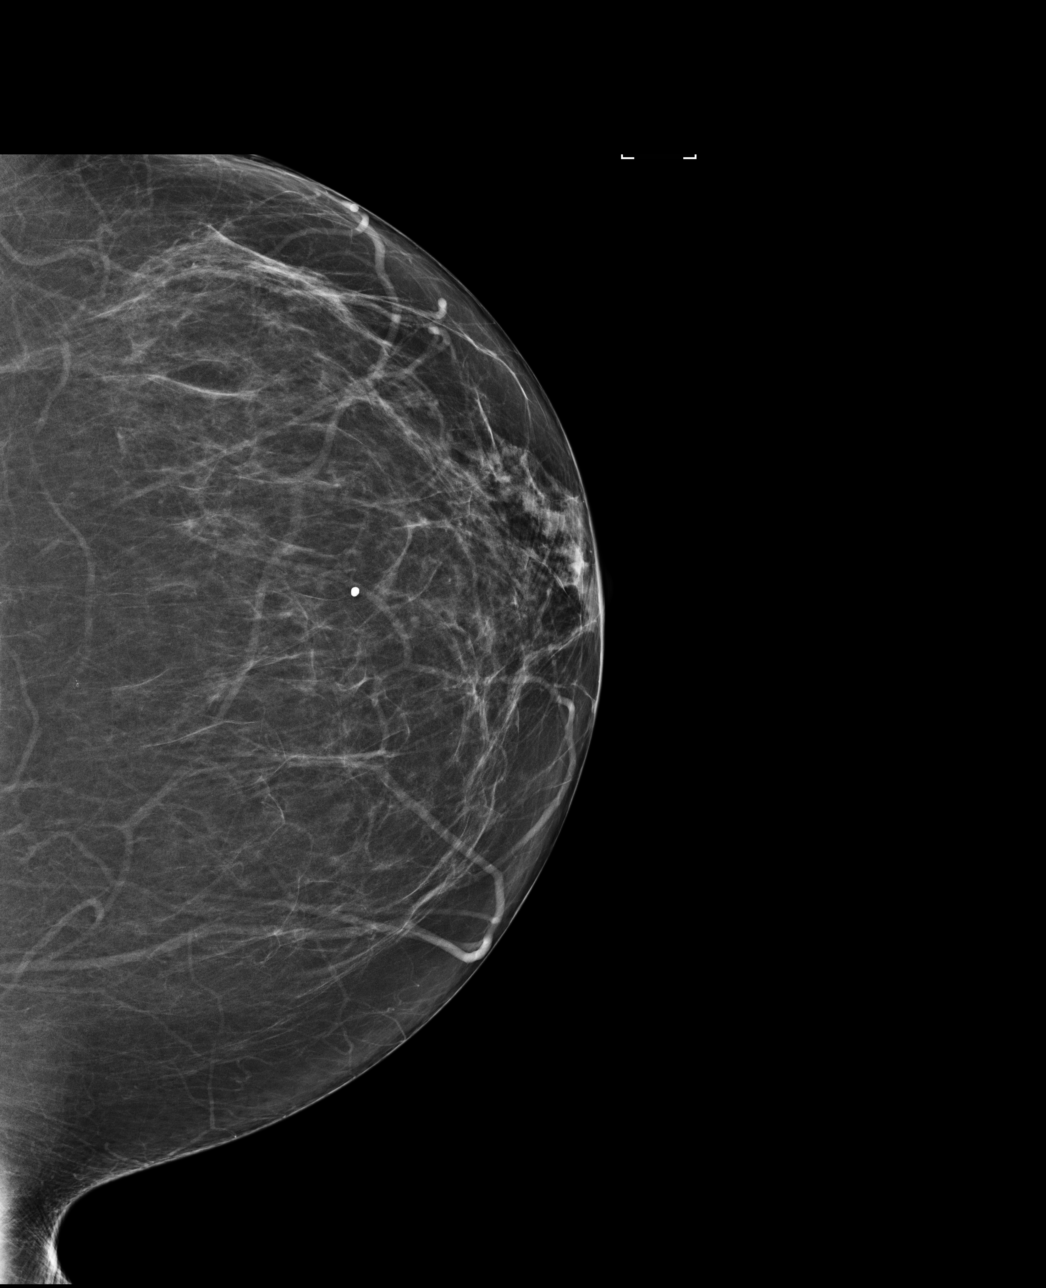

[R MLO]
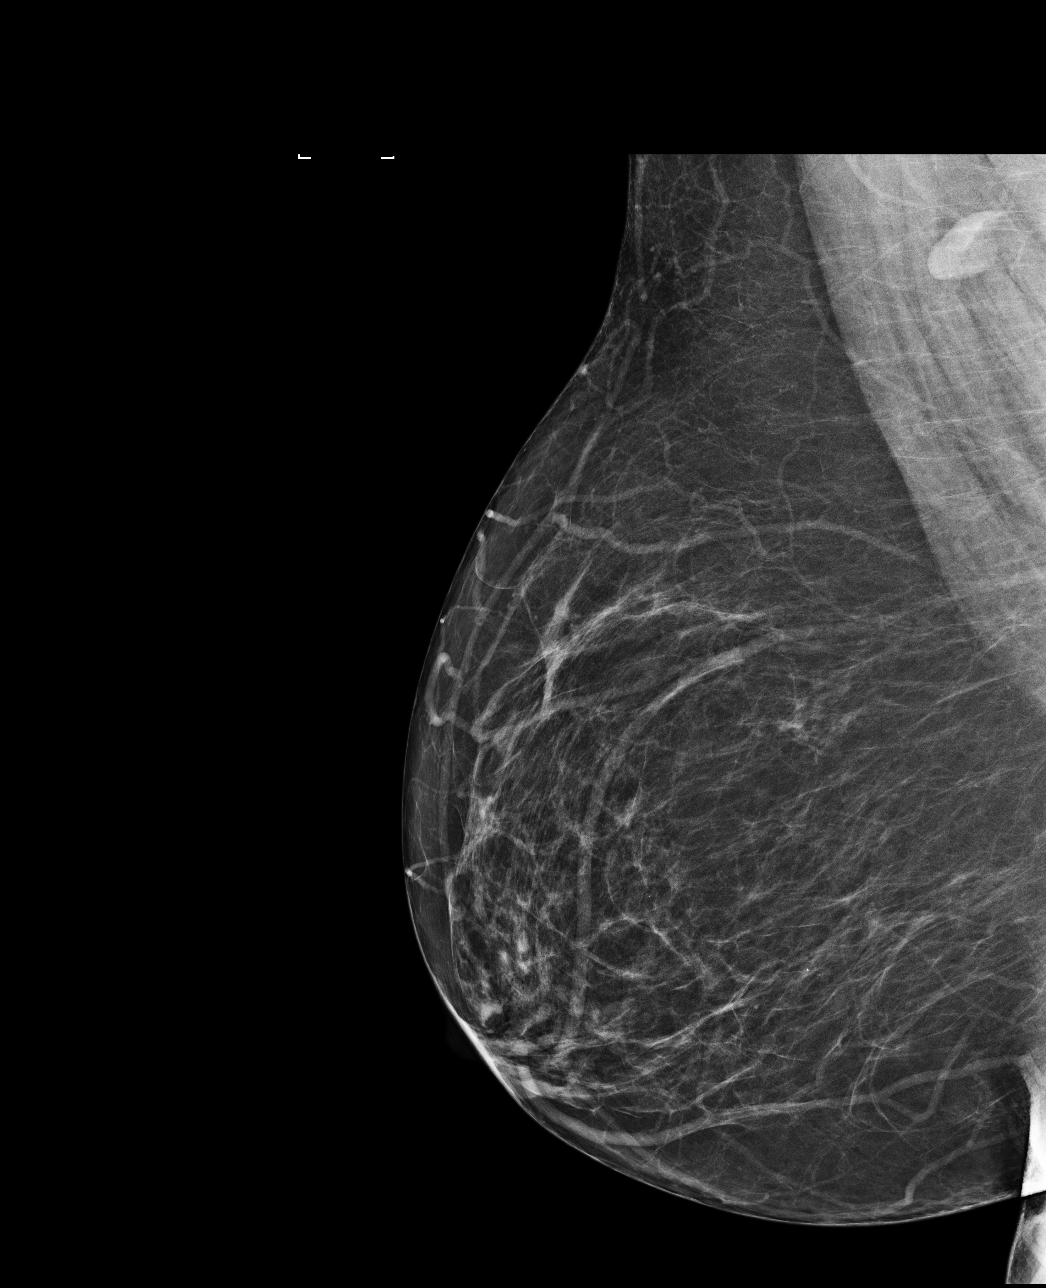

[L MLO]
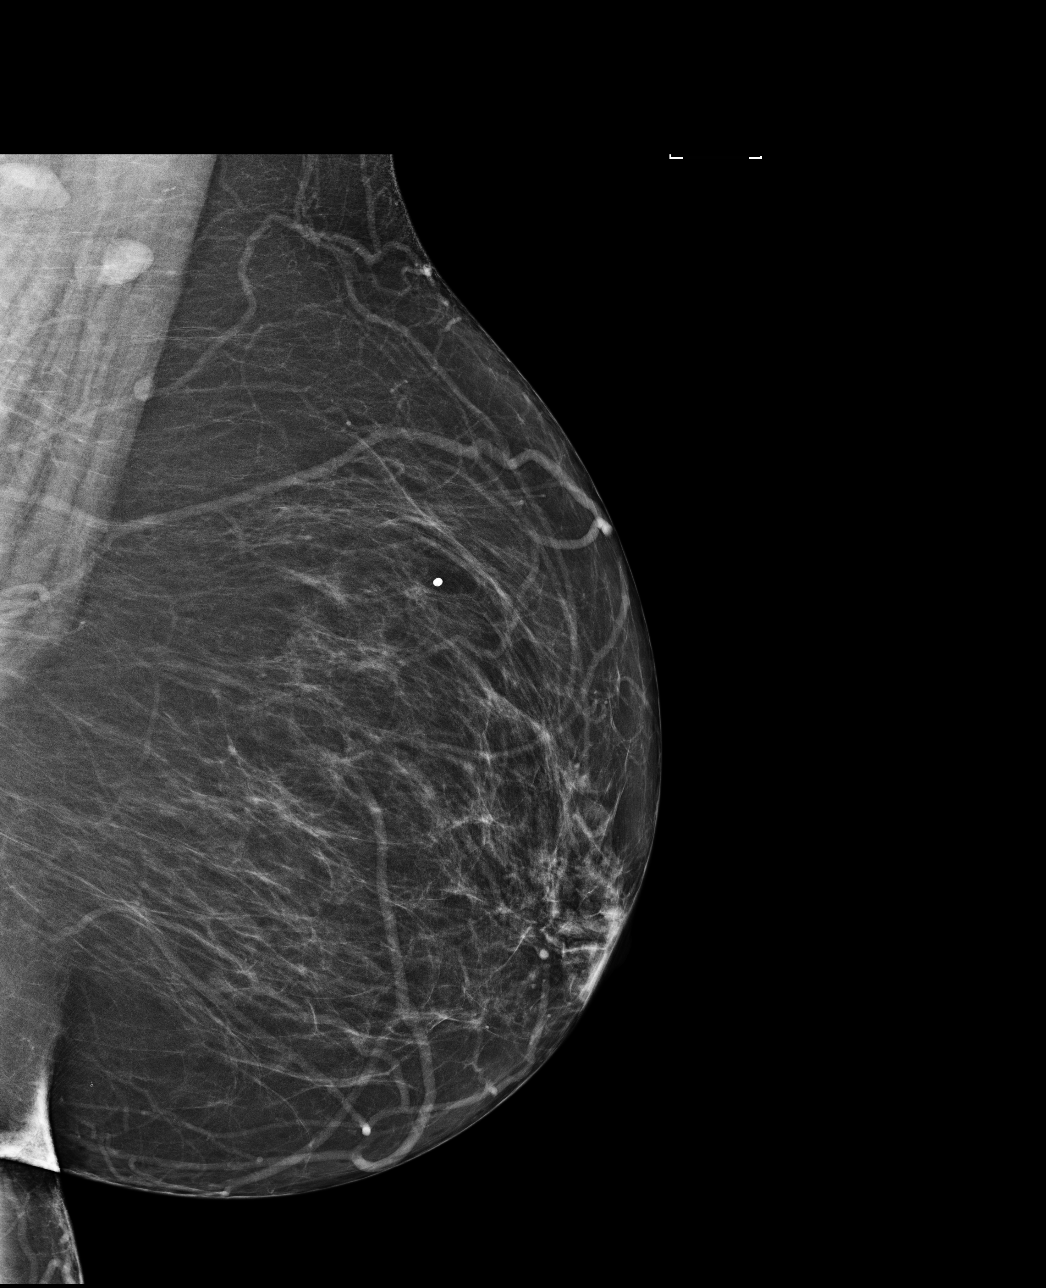

[4 of 4 positions shown; findings below may reference images not displayed]

ACR Breast Density Category b: There are scattered areas of
fibroglandular density.
FINDINGS: There are no findings suspicious for malignancy. Images were
processed with CAD.
IMPRESSION: No mammographic evidence of malignancy. A result letter of this
screening mammogram will be mailed directly to the patient.

RECOMMENDATION:
Screening mammogram in one year. (Code:AS-G-LCT)

BI-RADS CATEGORY  1: Negative.

## 2017-08-08 ENCOUNTER — Encounter: Payer: Medicaid Other | Attending: Physical Medicine & Rehabilitation

## 2017-08-08 ENCOUNTER — Ambulatory Visit: Payer: Medicaid Other | Admitting: Physical Medicine & Rehabilitation

## 2017-08-08 ENCOUNTER — Encounter: Payer: Self-pay | Admitting: Physical Medicine & Rehabilitation

## 2017-08-08 VITALS — BP 135/81 | HR 73 | Resp 14 | Ht 61.0 in | Wt 178.0 lb

## 2017-08-08 DIAGNOSIS — M47816 Spondylosis without myelopathy or radiculopathy, lumbar region: Secondary | ICD-10-CM | POA: Diagnosis present

## 2017-08-08 DIAGNOSIS — M533 Sacrococcygeal disorders, not elsewhere classified: Secondary | ICD-10-CM | POA: Diagnosis not present

## 2017-08-08 NOTE — Patient Instructions (Signed)
Sacroiliac injection was performed today. A combination of a naming medicine plus a cortisone medicine was injected. The injection was done under x-ray guidance. This procedure has been performed to help reduce low back and buttocks pain as well as potentially hip pain. The duration of this injection is variable lasting from hours to  Months. It may repeated if needed. 

## 2017-08-08 NOTE — Progress Notes (Signed)
  [facility]   Name: [name] [name] DOB:1960-12-13 MRN: [id number]  Date:08/08/2017  Physician: Alysia Penna, MD    Nurse/CMA: Roiza Wiedel, CMA   Allergies:  Allergies  Allergen Reactions  . Acetaminophen Other (See Comments)    Inflamed liver, hospitalized REACTION: Had liver problems, was hospitalized  . Dyazide [Hydrochlorothiazide W-Triamterene] Hives  . Lisinopril     Cough   . Triamterene Hives  . Losartan Rash    Pt had rash, worsening dizziness, and nausea after starting losartan, which improved after stopping losartan  . Morphine Hives and Rash    REACTION: rash    Consent Signed: Yes.    Is patient diabetic? Yes.    CBG today? Patient has steroid induced diabetes  Pregnant: No. LMP: No LMP recorded. Patient is postmenopausal. (age 32-55)  Anticoagulants: no Anti-inflammatory: no Antibiotics: no  Procedure: left sacroiliac steroid injection  Position: Prone Start Time: 1:34 pm  End Time: 1:38pm  Fluoro Time: 11s  RN/CMA Advit Trethewey, CMA Naiah Donahoe, CMA    Time 1:10pm 1:43pm    BP 135/81 137/88    Pulse 75 80    Respirations 14 14    O2 Sat 96 93    S/S 6 6    Pain Level 9/10 6/10     D/C home with [name], [name] & O X 3, D/C instructions reviewed, and sits independently.

## 2017-08-08 NOTE — Progress Notes (Addendum)
Right sacroiliac injection under fluoroscopic guidance  Indication: Right Low back and buttocks pain not relieved by medication management and other conservative care.  Informed consent was obtained after describing risks and benefits of the procedure with the patient, this includes bleeding, bruising, infection, paralysis and medication side effects. The patient wishes to proceed and has given written consent. The patient was placed in a prone position. The lumbar and sacral area was marked and prepped with Betadine. A 25-gauge 1-1/2 inch needle was inserted into the skin and subcutaneous tissue and 1 mL of 1% lidocaine was injected. Then a 25-gauge 3 inch spinal needle was inserted under fluoroscopic guidance into the Right sacroiliac joint. AP and lateral images were utilized. Omnipaque 180x0.5 mL under live fluoroscopy demonstrated no intravascular uptake. Then a solution containing one ML of 6 mgper ml betamethasone and 2 ML of 1% lidocaine MPF was injected x1.5 mL. Patient tolerated the procedure well. Post procedure instructions were given. Please see post procedure form. Opioid risk is 11, Discussed that would only prescribe tramadol on a infrequent basis for flareups that are acute not for chronic ongoing pain management.  Patient understands.

## 2017-08-10 ENCOUNTER — Other Ambulatory Visit: Payer: Self-pay | Admitting: Physical Medicine & Rehabilitation

## 2017-08-11 ENCOUNTER — Ambulatory Visit: Payer: Medicaid Other

## 2017-08-15 ENCOUNTER — Ambulatory Visit: Payer: Medicaid Other | Admitting: Allergy and Immunology

## 2017-08-15 ENCOUNTER — Encounter: Payer: Self-pay | Admitting: Allergy and Immunology

## 2017-08-15 ENCOUNTER — Telehealth: Payer: Self-pay | Admitting: Cardiovascular Disease

## 2017-08-15 VITALS — BP 150/90 | Temp 98.2°F | Ht 59.5 in | Wt 176.4 lb

## 2017-08-15 DIAGNOSIS — J4530 Mild persistent asthma, uncomplicated: Secondary | ICD-10-CM

## 2017-08-15 DIAGNOSIS — J3089 Other allergic rhinitis: Secondary | ICD-10-CM | POA: Diagnosis not present

## 2017-08-15 DIAGNOSIS — K219 Gastro-esophageal reflux disease without esophagitis: Secondary | ICD-10-CM | POA: Diagnosis not present

## 2017-08-15 DIAGNOSIS — L299 Pruritus, unspecified: Secondary | ICD-10-CM

## 2017-08-15 MED ORDER — RANITIDINE HCL 300 MG PO TABS: 300.0000 mg | ORAL_TABLET | Freq: Every day | ORAL | 5 refills | Status: DC

## 2017-08-15 MED ORDER — BUDESONIDE 180 MCG/ACT IN AEPB: 2.0000 | INHALATION_SPRAY | Freq: Every day | RESPIRATORY_TRACT | 5 refills | Status: DC

## 2017-08-15 NOTE — Patient Instructions (Addendum)
  1.  Allergen avoidance measures  2.  Treat and prevent inflammation:   A.  Montelukast 10 mg - 1 tablet 1 time per day  B.  Pulmicort 180 -2 inhalations 1 time per day (P.A.)  C.  OTC Nasacort -1 spray each nostril 1 time per day  3.  Treat and prevent reflux:   A.  Continue Protonix 40 mg in a.m.  B.  Start ranitidine 300 mg in p.m.  4.  Can use cetirizine 10 mg -1-2 tablets 1-2 times a day (maximum 40mg )  5.  If needed:   A.  OTC Benadryl  B.  OTC nasal saline  C.  Pro Air HFA 2 puffs every 4-6 hours  6.  Return to clinic in 4 weeks or earlier if problem

## 2017-08-15 NOTE — Telephone Encounter (Signed)
New message   Pt c/o of Chest Pain: STAT if CP now or developed within 24 hours  1. Are you having CP right now? NO 2. Are you experiencing any other symptoms (ex. SOB, nausea, vomiting, sweating)? NO  3. How long have you been experiencing CP? 2 WEEKS, feels like a burning sensation, or sharp pain  4. Is your CP continuous or coming and going? COMING AND GOING   5. Have you taken Nitroglycerin? NO ?

## 2017-08-15 NOTE — Telephone Encounter (Signed)
Call returned to Pt.  Pt getting ready to be seen by new allergist for allergies.   Per Pt she has noticed over the last couple weeks some burning, heavy pressure in her chest.  Per Pt she has indigestion for which she take protonix.  Pt states burning happens in the morning, goes away after the protonix over the afternoon.  Pt states she doesn't take nitro because it causes her a headache.  Pt worried because her mother had a lot of cardiac issues before she passed. Pt had appt in June, asked Pt if she would like an earlier appt?  Notified appt days available in May.  Pt agrees, would like appt Sep 04, 2017 at 11:45 am with APP Phylliss Bob. Pt thanked nurse.  No further action at this time.

## 2017-08-15 NOTE — Progress Notes (Signed)
Dear Dr. Berneice Gandy,  Thank you for referring [name] [name] to the [facility] on 08/15/2017.   Below is a summation of this patient's evaluation and recommendations.  Thank you for your referral. I will keep you informed about this patient's response to treatment.   If you have any questions please do not hesitate to contact me.   Sincerely,  Jiles Prows, MD Allergy / Immunology [facility]   ______________________________________________________________________    NEW PATIENT NOTE  Referring Provider: Thomasene Ripple, MD Primary Provider: [name], DO Date of office visit: 08/15/2017    Subjective:   Chief Complaint:  [name] [name] (DOB: [birth year]) is a 57 y.o. female who presents to the clinic on 08/15/2017 with a chief complaint of Cough; Headache; Pruritis; Wheezing; and sneezing .     HPI: [name] [name] presents to this clinic in evaluation of allergies and asthma and pruritus and reflux.  Apparently I had seen her in this clinic approximately 5 years ago for similar type of issues.  She has a long history of allergic disease manifested as a allergic rhinitis and asthma.  On a perennial basis with springtime and fall time exacerbation she will develop nasal congestion and sneezing and nose blowing as well as recurrent coughing and wheezing.  Provoking factors for her symptoms and expect include exposure to the outdoors and pollen and exposure to dust.  Most recently she has been using her bronchodilator twice a day for issues with wheezing and coughing.  If she exerts herself to any extent she developed wheezing and coughing.  Outdoor exposure during the spring has really been difficult for her asthma.  Her treatment to date includes the use of montelukast but no other controller agents for either her upper or lower airway.  In addition to her airway issues she has been  having pruritus over the course of the past month.  It is much worse at nighttime.  It is global.  There is not an obvious dermatitis associated with this issue.  She takes does take Benadryl which helps somewhat.  She has not noted any obvious provoking factor giving rise to this issue nor any associated systemic or constitutional symptoms.  She has not using a new over-the-counter medication or new prescription medication in 2019.  She has also been having burning chest pain and heartburn in her sternal region even while using Protonix once a day.  If she uses Protonix twice a day this does appear to help somewhat.  She does not consume any caffeine and she does not eat any chocolate and she does not drink any alcohol.  She has Addison's disease and is on daily steroid use.  She is also HIV positive with undetectable RNA on her current therapy.  Past Medical History:  Diagnosis Date  . Allergic rhinitis 05/09/2006  . Allergy   . Anxiety   . Arthritis   . Asthma   . CHF (congestive heart failure) ([place])   . Chronic back pain   . Diabetes mellitus without complication ([place])   . GERD (gastroesophageal reflux disease)   . Heart murmur    as a child  . 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 s/p 12 week course of simeprevir and sofosbuvir between October 2014 and January 2015 with resolution.  Marland Kitchen History of shingles   . HIV infection ([place])  1994  . Hyperlipidemia    no meds taken now  . Hypertension   . Pituitary microadenoma ([place]) 08/08/2014  . Prediabetes   . Refusal of blood transfusions as patient is Jehovah's Witness   . Secondary adrenal insufficiency ([place]) 06/29/2014  . Urticaria     Past Surgical History:  Procedure Laterality Date  . BUNIONECTOMY     b/l  . COLECTOMY     2003 for diverticulitis, had colostomy bag and then reversed  . COLONOSCOPY    . HAND SURGERY    . NASAL SINUS SURGERY    . SHOULDER SURGERY     left  . TONSILLECTOMY       Allergies as of 08/15/2017      Reactions   Acetaminophen Other (See Comments)   Inflamed liver, hospitalized REACTION: Had liver problems, was hospitalized   Dyazide [hydrochlorothiazide W-triamterene] Hives   Lisinopril    Cough    Triamterene Hives   Losartan Rash   Pt had rash, worsening dizziness, and nausea after starting losartan, which improved after stopping losartan   Morphine Hives, Rash   REACTION: rash      Medication List      albuterol 108 (90 Base) MCG/ACT inhaler Commonly known as:  PROAIR HFA INHALE 1-2 PUFFS BY MOUTH INTO THE LUNGS EVERY 6 HOURS AS NEEDED FOR WHEEEZING AND SHORTNESS OF BREATH   amLODipine 10 MG tablet Commonly known as:  NORVASC Take 1 tablet (10 mg total) by mouth daily.   atorvastatin 10 MG tablet Commonly known as:  LIPITOR Take 1 tablet (10 mg total) by mouth daily.   BIKTARVY 50-200-25 MG Tabs tablet Generic drug:  bictegravir-emtricitabine-tenofovir AF TAKE 1 TABLET BY MOUTH DAILY.   cetirizine 10 MG tablet Commonly known as:  ZYRTEC TAKE 1 TABLET BY MOUTH EVERY DAY AS NEEDED FOR ALLERGIES   diazepam 5 MG tablet Commonly known as:  VALIUM TAKE 1 TABLET BY MOUTH EVERY NIGHT AT BEDTIME AS NEEDED FOR ANXIETY   diphenhydrAMINE 25 MG tablet Commonly known as:  DIPHENHIST TAKE 1 tablet AS NEEDED FOR ITCHING.   metFORMIN 500 MG 24 hr tablet Commonly known as:  GLUCOPHAGE-XR Take 1 tablet (500 mg total) by mouth daily with breakfast.   metoprolol tartrate 25 MG tablet Commonly known as:  LOPRESSOR Take 1 tablet (25 mg total) by mouth 2 (two) times daily.   montelukast 10 MG tablet Commonly known as:  SINGULAIR Take 1 tablet (10 mg total) by mouth daily.   nitroGLYCERIN 0.4 MG SL tablet Commonly known as:  NITROSTAT Place 1 tablet (0.4 mg total) under the tongue every 5 (five) minutes as needed for chest pain.   pantoprazole 40 MG tablet Commonly known as:  PROTONIX Take 1 tablet (40 mg total) by mouth daily.     PATADAY 0.2 % Soln Generic drug:  Olopatadine HCl Apply 1 drop to eye daily as needed. For allergies   promethazine 25 MG tablet Commonly known as:  PHENERGAN Take 1 tablet (25 mg total) by mouth every 6 (six) hours as needed for nausea or vomiting.   sodium chloride 0.65 % Soln nasal spray Commonly known as:  OCEAN Place 1 spray into both nostrils as needed for congestion.   traMADol 50 MG tablet Commonly known as:  ULTRAM Take 1 tablet (50 mg total) by mouth every 12 (twelve) hours as needed.   Vitamin D (Cholecalciferol) 1000 units Caps Take 1,000 mg by mouth daily.       Review of systems  negative except as noted in HPI / PMHx or noted below:  Review of Systems  Constitutional: Negative.   HENT: Negative.   Eyes: Negative.   Respiratory: Negative.   Cardiovascular: Negative.   Gastrointestinal: Negative.   Genitourinary: Negative.   Musculoskeletal: Negative.   Skin: Negative.   Neurological: Negative.   Endo/Heme/Allergies: Negative.   Psychiatric/Behavioral: Negative.     Family History  Problem Relation Age of Onset  . Heart disease Mother   . Diabetes Mother   . Stroke Mother   . Heart disease Father   . Stroke Father   . Diabetes Father   . Hepatitis Sister        hcv  . Asthma Sister   . Allergic rhinitis Sister   . Stroke Other   . Colon polyps Brother   . Cancer Sister        lung  . Asthma Sister   . Allergic rhinitis Sister   . Cancer Maternal Aunt   . Cancer Maternal Aunt   . Colon cancer Neg Hx   . Esophageal cancer Neg Hx   . Stomach cancer Neg Hx   . Rectal cancer Neg Hx   . Angioedema Neg Hx   . Eczema Neg Hx   . Urticaria Neg Hx     Social History   Socioeconomic History  . Marital status: Single    Spouse name: Not on file  . Number of children: 0  . Years of education: Not on file  . Highest education level: Not on file  Occupational History  . Not on file  Social Needs  . Financial resource strain: Not on file  .  Food insecurity:    Worry: Not on file    Inability: Not on file  . Transportation needs:    Medical: Not on file    Non-medical: Not on file  Tobacco Use  . Smoking status: Former Smoker    Years: 20.00    Types: Cigarettes    Last attempt to quit: 04/26/2007    Years since quitting: 10.3  . Smokeless tobacco: Never Used  . Tobacco comment: QUIT 2009  Substance and Sexual Activity  . Alcohol use: No    Alcohol/week: 0.0 oz  . Drug use: No  . Sexual activity: Not on file    Comment: pt. given condoms  Lifestyle  . Physical activity:    Days per week: Not on file    Minutes per session: Not on file  . Stress: Not on file  Relationships  . Social connections:    Talks on phone: Not on file    Gets together: Not on file    Attends religious service: Not on file    Active member of club or organization: Not on file    Attends meetings of clubs or organizations: Not on file    Relationship status: Not on file  . Intimate partner violence:    Fear of current or ex partner: Not on file    Emotionally abused: Not on file    Physically abused: Not on file    Forced sexual activity: Not on file  Other Topics Concern  . Not on file  Social History Narrative   Alternate # [phone]    Environmental and Social history  Lives in a house with a dry environment, no animals located inside the household, carpet in the bedroom, no plastic on the bed, no plastic on the pillow, no smokers located inside the household.  Objective:  Vitals:   08/15/17 1535  BP: (!) 150/90  Temp: 98.2 F (36.8 C)   Height: 4' 11.5" (151.1 cm) Weight: 176 lb 6.4 oz (80 kg)  Physical Exam  HENT:  Head: Normocephalic. Head is without right periorbital erythema and without left periorbital erythema.  Right Ear: Tympanic membrane and external ear normal. A foreign body (Tube) is present.  Left Ear: External ear and ear canal normal. Tympanic membrane is perforated.  Nose: Mucosal edema present.  No rhinorrhea.  Mouth/Throat: Oropharynx is clear and moist and mucous membranes are normal. No oropharyngeal exudate.  Eyes: Pupils are equal, round, and reactive to light. Conjunctivae and lids are normal.  Neck: Trachea normal. No tracheal deviation present. No thyromegaly present.  Cardiovascular: Normal rate, regular rhythm, S1 normal, S2 normal and normal heart sounds.  No murmur heard. Pulmonary/Chest: Effort normal. No stridor. No respiratory distress. She has no wheezes. She has no rales. She exhibits no tenderness.  Abdominal: Soft. She exhibits no distension and no mass. There is no hepatosplenomegaly. There is no tenderness. There is no rebound and no guarding.  Musculoskeletal: She exhibits no edema or tenderness.  Lymphadenopathy:       Head (right side): No tonsillar adenopathy present.       Head (left side): No tonsillar adenopathy present.    She has no cervical adenopathy.    She has no axillary adenopathy.  Neurological: She is alert.  Skin: No rash noted. She is not diaphoretic. No erythema. No pallor. Nails show no clubbing.    Diagnostics: Allergy skin tests were performed.  She demonstrated hypersensitivity to house dust mite.  Spirometry was performed and demonstrated an FEV1 of 1.49 @ 81 % of predicted. FEV1/FVC = 0.85.  Following the administration of nebulized albuterol her FEV1 did not change significantly.  Results of a chest x-ray obtained 09 Sep 2015 identified the following:  The cardiomediastinal silhouette is within normal limits. The lungs are well inflated without evidence of airspace consolidation, edema, pleural effusion, or pneumothorax. No acute osseous abnormality is Identified.  Results of a brain MRI obtained one October 2018 identified the following:  Sinuses/Orbits: No fluid levels or advanced mucosal thickening. No mastoid or middle ear effusion. Normal orbits.  Results of blood tests obtained 21 February 2017 identified normal hepatic  and renal function, WBC 8.8, hemoglobin 13.8, platelet 248,  Assessment and Plan:    1. Not well controlled mild persistent asthma   2. Other allergic rhinitis   3. Gastroesophageal reflux disease, esophagitis presence not specified   4. Pruritic disorder     1.  Allergen avoidance measures  2.  Treat and prevent inflammation:   A.  Montelukast 10 mg - 1 tablet 1 time per day  B.  Pulmicort 180 -2 inhalations 1 time per day (P.A.)  C.  OTC Nasacort -1 spray each nostril 1 time per day  3.  Treat and prevent reflux:   A.  Continue Protonix 40 mg in a.m.  B.  Start ranitidine 300 mg in p.m.  4.  Can use cetirizine 10 mg -1-2 tablets 1-2 times a day (maximum 40mg )  5.  If needed:   A.  OTC Benadryl  B.  OTC nasal saline  C.  Pro Air HFA 2 puffs every 4-6 hours  6.  Return to clinic in 4 weeks or earlier if problem  Yaslene appears to have 2 major insults to her respiratory tract.  She has a combination of atopic respiratory disease and reflux  induced respiratory disease and we will treat her with a combination of allergen avoidance measures, anti-inflammatory agents for her respiratory tract, and treatment directed against reflux as noted above.  In addition she has a pruritic disorder without an obvious etiologic factor and may be tied up with her global atopic disease.  We will have her use cetirizine up to 40 mg a day and consider further evaluation and treatment based upon her response to this approach.  I will regroup with her in 4 weeks.  Jiles Prows, MD Allergy / Immunology [facility]

## 2017-08-16 ENCOUNTER — Other Ambulatory Visit: Payer: Self-pay | Admitting: *Deleted

## 2017-08-16 ENCOUNTER — Encounter: Payer: Self-pay | Admitting: Allergy and Immunology

## 2017-08-16 ENCOUNTER — Telehealth: Payer: Self-pay | Admitting: Allergy and Immunology

## 2017-08-16 ENCOUNTER — Ambulatory Visit: Payer: Medicaid Other | Admitting: Allergy and Immunology

## 2017-08-16 MED ORDER — ALBUTEROL SULFATE HFA 108 (90 BASE) MCG/ACT IN AERS: INHALATION_SPRAY | RESPIRATORY_TRACT | 1 refills | Status: DC

## 2017-08-16 NOTE — Telephone Encounter (Signed)
Approval for Pulmicort faxed.

## 2017-08-16 NOTE — Telephone Encounter (Signed)
Patient was seen yesterday and said she didn't realize she was out of refills for her [employer], so she needs that sent in. Pulmicort was sent to the pharmacy, but the pharmacy told her she needs a prior authorization before she can pick it up. [employer].

## 2017-08-16 NOTE — Telephone Encounter (Signed)
PA received for Pulmicort, I will attempt to get it approved. I will send Proair.

## 2017-08-17 ENCOUNTER — Encounter: Payer: Self-pay | Admitting: Cardiology

## 2017-08-21 ENCOUNTER — Other Ambulatory Visit: Payer: Self-pay | Admitting: Physical Medicine & Rehabilitation

## 2017-08-31 ENCOUNTER — Ambulatory Visit
Admission: RE | Admit: 2017-08-31 | Discharge: 2017-08-31 | Disposition: A | Discharge status: Home / Self Care | Payer: Medicaid Other | Source: Ambulatory Visit | Attending: *Deleted | Admitting: *Deleted

## 2017-08-31 DIAGNOSIS — Z1231 Encounter for screening mammogram for malignant neoplasm of breast: Secondary | ICD-10-CM | POA: Diagnosis not present

## 2017-09-04 ENCOUNTER — Ambulatory Visit: Payer: Medicaid Other | Admitting: Cardiology

## 2017-09-04 ENCOUNTER — Encounter: Payer: Self-pay | Admitting: Cardiology

## 2017-09-04 VITALS — BP 138/82 | HR 81 | Ht 59.5 in | Wt 177.5 lb

## 2017-09-04 DIAGNOSIS — R0789 Other chest pain: Secondary | ICD-10-CM

## 2017-09-04 DIAGNOSIS — E2749 Other adrenocortical insufficiency: Secondary | ICD-10-CM | POA: Diagnosis not present

## 2017-09-04 DIAGNOSIS — K219 Gastro-esophageal reflux disease without esophagitis: Secondary | ICD-10-CM

## 2017-09-04 DIAGNOSIS — I10 Essential (primary) hypertension: Secondary | ICD-10-CM | POA: Diagnosis not present

## 2017-09-04 DIAGNOSIS — B20 Human immunodeficiency virus [HIV] disease: Secondary | ICD-10-CM

## 2017-09-04 DIAGNOSIS — E099 Drug or chemical induced diabetes mellitus without complications: Secondary | ICD-10-CM

## 2017-09-04 DIAGNOSIS — T380X5A Adverse effect of glucocorticoids and synthetic analogues, initial encounter: Secondary | ICD-10-CM | POA: Diagnosis not present

## 2017-09-04 NOTE — Progress Notes (Signed)
Cardiology Office Note:    Date:  09/04/2017   ID:  [name] [name], DOB 1961/02/22, MRN [id number]  PCP:  [name], DO  Cardiologist:  Jenkins Rouge, MD  Referring MD: Kalman Shan Ratlif*   Chief Complaint  Patient presents with  . Chest Pain    History of Present Illness:    [name] [name] is a 57 y.o. female with a past medical history significant for adrenal insufficiency, HIV, hepatitis C, pituitary micro adenoma, CKD and pancreatitis.  She is a Sales promotion account executive Witness and will not use blood products.  She was last seen in the office by Dr. Johnsie Cancel on 09/22/2016 for evaluation of atypical chest pain that was thought to be related to Addison's disease.  She is here today alone for evaluation of chest discomfort. She has been feeling a heaviness like a weight on her chest, usually occurs with activity like doing laundry or walking for about the last 2 weeks. She also feels a "cramp" in her neck at the same time and mild shortness of breath. She also has some right arm weakness and numbness that occurs sometimes with the chest pressure, but then at other times as well. She is a smoker and feels like she has trouble getting her breath, like when she is smoking. Her discomfort lasts 3-4 minutes and improves when she stops activity. She also occasionally feels lightheaded with these episodes but dizziness is common for her. Her symptoms are similar to those that she saw Dr. Johnsie Cancel for last year when she had a normal stress test.  She feels that her symptoms most often occur in the late morning after she has taken all of her morning medication and may be GI related. She feels that her arm weakness started after she started Jardience about 1 week ago. She says that she feels that she is more short of breath since starting the Jardience. She has had trouble with allergies and has seen her allergist and is being treated and feels that all of this may be related to allergies.    She does not  rest well, has insomnia that she takes valium occasionally for. She has never been able to lay flat, mostly due to acid reflux. She does not feel smothering when laying flat. She doesn't usually have edema, only occasional ankle edema. She feels that she has gained wt related to the steroids for Addison's .   Had normal stress echo in 2013  Normal ABI's done for leg cramps 07/25/14   Normal LLE Venous Duplex 05/27/14 Stress Echo 09/2014 Normal  Normal Lexiscan Myoview 09/2016   Past Medical History:  Diagnosis Date  . Allergic rhinitis 05/09/2006  . Allergy   . Anxiety   . Arthritis   . Asthma   . CHF (congestive heart failure) ([place])   . Chronic back pain   . Diabetes mellitus without complication ([place])   . GERD (gastroesophageal reflux disease)   . Heart murmur    as a child  . 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 s/p 12 week course of simeprevir and sofosbuvir between October 2014 and January 2015 with resolution.  Marland Kitchen History of shingles   . HIV infection ([place])    1994  . Hyperlipidemia    no meds taken now  . Hypertension   . Pituitary microadenoma ([place]) 08/08/2014  . Prediabetes   . Refusal of blood transfusions as patient is Jehovah's Witness   .  Secondary adrenal insufficiency ([place]) 06/29/2014  . Urticaria     Past Surgical History:  Procedure Laterality Date  . BUNIONECTOMY     b/l  . COLECTOMY     2003 for diverticulitis, had colostomy bag and then reversed  . COLONOSCOPY    . HAND SURGERY    . NASAL SINUS SURGERY    . SHOULDER SURGERY     left  . TONSILLECTOMY      Current Medications: Current Meds  Medication Sig  . albuterol (PROAIR HFA) 108 (90 Base) MCG/ACT inhaler INHALE 1-2 PUFFS BY MOUTH INTO THE LUNGS EVERY 6 HOURS AS NEEDED FOR WHEEEZING AND SHORTNESS OF BREATH  . amLODipine (NORVASC) 10 MG tablet Take 1 tablet (10 mg total) by mouth daily.  Marland Kitchen atorvastatin (LIPITOR) 10 MG tablet Take 1 tablet (10 mg total) by mouth  daily.  Marland Kitchen BIKTARVY 50-200-25 MG TABS tablet TAKE 1 TABLET BY MOUTH DAILY.  . budesonide (PULMICORT FLEXHALER) 180 MCG/ACT inhaler Inhale 2 puffs into the lungs daily.  . cetirizine (ZYRTEC) 10 MG tablet TAKE 1 TABLET BY MOUTH EVERY DAY AS NEEDED FOR ALLERGIES  . diazepam (VALIUM) 5 MG tablet TAKE 1 TABLET BY MOUTH EVERY NIGHT AT BEDTIME AS NEEDED FOR ANXIETY  . diphenhydrAMINE (DIPHENHIST) 25 MG tablet TAKE 1 tablet AS NEEDED FOR ITCHING.  . empagliflozin (JARDIANCE) 10 MG TABS tablet Take 10 mg by mouth daily.  . hydrocortisone (CORTEF) 10 MG tablet Take 10 mg by mouth as directed. Pt takes 10 mg in the am and 5 mg at 11 am and 3 pm  . metoprolol tartrate (LOPRESSOR) 25 MG tablet Take 1 tablet (25 mg total) by mouth 2 (two) times daily.  . montelukast (SINGULAIR) 10 MG tablet Take 1 tablet (10 mg total) by mouth daily.  . nitroGLYCERIN (NITROSTAT) 0.4 MG SL tablet Place 1 tablet (0.4 mg total) under the tongue every 5 (five) minutes as needed for chest pain.  Marland Kitchen Olopatadine HCl (PATADAY) 0.2 % SOLN Apply 1 drop to eye daily as needed. For allergies   . pantoprazole (PROTONIX) 40 MG tablet Take 1 tablet (40 mg total) by mouth daily.  . promethazine (PHENERGAN) 25 MG tablet Take 1 tablet (25 mg total) by mouth every 6 (six) hours as needed for nausea or vomiting.  . ranitidine (ZANTAC) 300 MG tablet Take 1 tablet (300 mg total) by mouth at bedtime.  . sodium chloride (OCEAN) 0.65 % SOLN nasal spray Place 1 spray into both nostrils as needed for congestion.  . traMADol (ULTRAM) 50 MG tablet TAKE ONE TABLET BY MOUTH EVERY 12 HOURS AS NEEDED  . Vitamin D, Cholecalciferol, 1000 units CAPS Take 1,000 mg by mouth daily.     Allergies:   Acetaminophen; Dyazide [hydrochlorothiazide w-triamterene]; Lisinopril; Triamterene; Losartan; and Morphine   Social History   Socioeconomic History  . Marital status: Single    Spouse name: Not on file  . Number of children: 0  . Years of education: Not on file    . Highest education level: Not on file  Occupational History  . Not on file  Social Needs  . Financial resource strain: Not on file  . Food insecurity:    Worry: Not on file    Inability: Not on file  . Transportation needs:    Medical: Not on file    Non-medical: Not on file  Tobacco Use  . Smoking status: Former Smoker    Years: 20.00    Types: Cigarettes    Last  attempt to quit: 04/26/2007    Years since quitting: 10.3  . Smokeless tobacco: Never Used  . Tobacco comment: QUIT 2009  Substance and Sexual Activity  . Alcohol use: No    Alcohol/week: 0.0 oz  . Drug use: No  . Sexual activity: Not on file    Comment: pt. given condoms  Lifestyle  . Physical activity:    Days per week: Not on file    Minutes per session: Not on file  . Stress: Not on file  Relationships  . Social connections:    Talks on phone: Not on file    Gets together: Not on file    Attends religious service: Not on file    Active member of club or organization: Not on file    Attends meetings of clubs or organizations: Not on file    Relationship status: Not on file  Other Topics Concern  . Not on file  Social History Narrative   Alternate # [phone]     Family History: The patient's family history includes Allergic rhinitis in her sister and sister; Asthma in her sister and sister; Cancer in her maternal aunt, maternal aunt, and sister; Colon polyps in her brother; Diabetes in her father and mother; Heart disease in her father and mother; Hepatitis in her sister; Stroke in her father, mother, and other. There is no history of Colon cancer, Esophageal cancer, Stomach cancer, Rectal cancer, Angioedema, Eczema, or Urticaria. ROS:   Please see the history of present illness.     All other systems reviewed and are negative.  EKGs/Labs/Other Studies Reviewed:    The following studies were reviewed today:  Lexiscan Myoview 10/10/2016  Nuclear stress EF: 63%.  There was no ST segment  deviation noted during stress.  The study is normal.  The left ventricular ejection fraction is normal (55-65%).   1. EF 63%, normal wall motion.  2. No evidence for ischemia or infarction on perfusion images.   Normal study.   Stress Echo 10/21/14 Study Conclusions - Stress ECG conclusions: The stress ECG was normal. - Baseline: LV global systolic function was normal. The estimated   LV ejection fraction was 60%. Normal wall motion; no LV regional   wall motion abnormalities. - Peak stress: LV global systolic function was vigorous. Normal   wall motion; no LV regional wall motion abnormalities. - Impressions: Normal increase in thickening and contractility in   all segments. This is a normal stress echo study.  Impressions: - Normal increase in thickening and contractility in all segments.   This is a normal stress echo study.    EKG:  EKG is ordered today.  The ekg ordered today demonstrates NSR with non-specific T wave abnormalities, similar to previous tracings. 83 bpm.   Recent Labs: 02/21/2017: ALT 20; BUN 12; Creat 0.79; Hemoglobin 13.8; Platelets 248; Potassium 3.9; Sodium 139   Recent Lipid Panel    Component Value Date/Time   CHOL 160 02/21/2017 1446   TRIG 306 (H) 02/21/2017 1446   HDL 37 (L) 02/21/2017 1446   CHOLHDL 4.3 02/21/2017 1446   VLDL 44 (H) 07/12/2016 1137   LDLCALC 86 02/21/2017 1446    Physical Exam:    VS:  BP 138/82   Pulse 81   Ht 4' 11.5" (1.511 m)   Wt 177 lb 8 oz (80.5 kg)   SpO2 98%   BMI 35.25 kg/m     Wt Readings from Last 3 Encounters:  09/04/17 177 lb 8 oz (80.5 kg)  08/15/17 176 lb 6.4 oz (80 kg)  08/08/17 178 lb (80.7 kg)     Physical Exam  Constitutional: She is oriented to person, place, and time. She appears well-developed and well-nourished. No distress.  HENT:  Head: Normocephalic and atraumatic.  Neck: Normal range of motion. Neck supple. No JVD present.  Cardiovascular: Normal rate, regular rhythm, normal  heart sounds and intact distal pulses. Exam reveals no gallop and no friction rub.  No murmur heard. Pulmonary/Chest: Effort normal and breath sounds normal. No respiratory distress. She has no wheezes. She has no rales.  Abdominal: Soft. Bowel sounds are normal.  Central obesity  Musculoskeletal: Normal range of motion. She exhibits no edema.  Presence of buffalo hump  Neurological: She is alert and oriented to person, place, and time.  Skin: Skin is warm and dry.  Psychiatric: She has a normal mood and affect. Her behavior is normal. Thought content normal.  Vitals reviewed.    ASSESSMENT:    1. Chest pressure   2. Secondary adrenal insufficiency ([place])   3. Essential hypertension   4. Gastroesophageal reflux disease, esophagitis presence not specified   5. 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 HIV disease ([place])    PLAN:    In order of problems listed above:  Chest discomfort: Somewhat typical with tightness and mild shortness of breath with activity, but is similar to prior symptoms in the past with normal stress echo in 09/2014 and normal lexiscan myoview in 09/2016. Pt feels that may be GI related or allergy related. Discussed the reassuring stress test not even a year ago and the patient feels that her symptoms are not different from that time and are very mild. I offered her another test if it would help to confirm symptoms for her, but she wishes to hold off. She will monitor symptoms and call if they worsen or become bothersome to her. We discussed proper use of nitroglycerine and when to call 911. She says that she feels very comfortable with this plan.   Addison's disease: Pt is on maintenance long term steroids. Followed by endocrinology  Hypertension: Well controlled.   GERD: takes pantoprazole in the morning and ranitidine at night. This could be part of her symptomatology considering her symptoms most often occur after taking her morning meds. Advised to be sure  and take her steroids with food.   Diabetes, steroid induced: She has recently been started on Jardiance. She feels that some of her symptoms coincided with the initiation of this med like right arm numbness and shortness of breath. I am not aware of such side effects with Jardiance.    HIV:  Followed by Dr Johnnye Sima, on biktarvy started HIV 1 RNA quant ordered by ID   Hepatitis C:  treated   Medication Adjustments/Labs and Tests Ordered: Current medicines are reviewed at length with the patient today.  Concerns regarding medicines are outlined above. Labs and tests ordered and medication changes are outlined in the patient instructions below:  Patient Instructions  Medication Instructions: Your physician recommends that you continue on your current medications as directed. Please refer to the Current Medication list given to you today.   Labwork: .None Ordered  Procedures/Testing: None Ordered   Follow-Up: Your physician recommends that you schedule a follow-up appointment in: 6 months with Dr.Nishan    Any Additional Special Instructions Will Be Listed Below (If Applicable).     If you need a refill on your cardiac medications before your next appointment, please call your  pharmacy. '     Signed, Daune Perch, NP  09/04/2017 4:02 PM    [facility]

## 2017-09-04 NOTE — Patient Instructions (Signed)
Medication Instructions: Your physician recommends that you continue on your current medications as directed. Please refer to the Current Medication list given to you today.   Labwork: .None Ordered  Procedures/Testing: None Ordered   Follow-Up: Your physician recommends that you schedule a follow-up appointment in: 6 months with Dr.Nishan    Any Additional Special Instructions Will Be Listed Below (If Applicable).     If you need a refill on your cardiac medications before your next appointment, please call your pharmacy. '

## 2017-09-06 LAB — HEMOGLOBIN A1C: HEMOGLOBIN A1C: 7.2

## 2017-09-12 ENCOUNTER — Other Ambulatory Visit: Payer: Self-pay | Admitting: Allergy and Immunology

## 2017-09-12 ENCOUNTER — Encounter: Payer: Self-pay | Admitting: Allergy and Immunology

## 2017-09-12 ENCOUNTER — Other Ambulatory Visit: Payer: Self-pay | Admitting: Internal Medicine

## 2017-09-12 ENCOUNTER — Ambulatory Visit: Payer: Medicaid Other | Admitting: Allergy and Immunology

## 2017-09-12 VITALS — BP 128/80 | HR 68 | Resp 16

## 2017-09-12 DIAGNOSIS — L299 Pruritus, unspecified: Secondary | ICD-10-CM

## 2017-09-12 DIAGNOSIS — J3089 Other allergic rhinitis: Secondary | ICD-10-CM

## 2017-09-12 DIAGNOSIS — K219 Gastro-esophageal reflux disease without esophagitis: Secondary | ICD-10-CM | POA: Diagnosis not present

## 2017-09-12 DIAGNOSIS — J4530 Mild persistent asthma, uncomplicated: Secondary | ICD-10-CM | POA: Diagnosis not present

## 2017-09-12 MED ORDER — METOPROLOL TARTRATE 25 MG PO TABS: 25.0000 mg | ORAL_TABLET | Freq: Two times a day (BID) | ORAL | 11 refills | Status: DC

## 2017-09-12 MED ORDER — PROAIR HFA 108 (90 BASE) MCG/ACT IN AERS: INHALATION_SPRAY | RESPIRATORY_TRACT | 1 refills | Status: DC

## 2017-09-12 NOTE — Addendum Note (Signed)
Addended by: Velora Heckler on: 09/12/2017 01:11 PM   Modules accepted: Orders

## 2017-09-12 NOTE — Progress Notes (Signed)
Follow-up Note  Referring Provider: Thomasene Ripple, MD Primary Provider: [name], DO Date of Office Visit: 09/12/2017  Subjective:   [name] [name] (DOB: [birth year]) is a 57 y.o. female who returns to the [facility] [place] on 09/12/2017 in re-evaluation of the following:  HPI: [name] [name] presents to this clinic in evaluation of asthma and allergic rhinitis and reflux and pruritus evaluated during her initial evaluation of 15 August 2017 at which point in time we have made an attempt to address each issue.  Her asthma is significantly improved.  She does not need to use a short acting bronchodilator at this point.  She has very little issues with wheezing or coughing.  She has been consistently using her Pulmicort.  Her nose is doing wonderful at this point.  She really has very little congestion.  All of her headaches have resolved.  She has been consistently using her Nasacort.  Her reflux is under excellent control and she no longer has any heartburn. She continues on a PPI and H2RB.  She still has pruritus but this is much improved.  Mostly she has pruritus on nighttime but only intermittently at this point.  She is only using cetirizine 10 mg daily and occasionally Benadryl.   Allergies as of 09/12/2017      Reactions   Acetaminophen Other (See Comments)   Inflamed liver, hospitalized REACTION: Had liver problems, was hospitalized   Dyazide [hydrochlorothiazide W-triamterene] Hives   Lisinopril    Cough    Triamterene Hives   Losartan Rash   Pt had rash, worsening dizziness, and nausea after starting losartan, which improved after stopping losartan   Morphine Hives, Rash   REACTION: rash      Medication List      albuterol 108 (90 Base) MCG/ACT inhaler Commonly known as:  PROAIR HFA INHALE 1-2 PUFFS BY MOUTH INTO THE LUNGS EVERY 6 HOURS AS NEEDED FOR WHEEEZING AND SHORTNESS OF BREATH   amLODipine 10 MG tablet Commonly known as:  NORVASC Take 1  tablet (10 mg total) by mouth daily.   atorvastatin 10 MG tablet Commonly known as:  LIPITOR Take 1 tablet (10 mg total) by mouth daily.   BIKTARVY 50-200-25 MG Tabs tablet Generic drug:  bictegravir-emtricitabine-tenofovir AF TAKE 1 TABLET BY MOUTH DAILY.   budesonide 180 MCG/ACT inhaler Commonly known as:  PULMICORT FLEXHALER Inhale 2 puffs into the lungs daily.   cetirizine 10 MG tablet Commonly known as:  ZYRTEC TAKE 1 TABLET BY MOUTH EVERY DAY AS NEEDED FOR ALLERGIES   diazepam 5 MG tablet Commonly known as:  VALIUM TAKE 1 TABLET BY MOUTH EVERY NIGHT AT BEDTIME AS NEEDED FOR ANXIETY   diphenhydrAMINE 25 MG tablet Commonly known as:  DIPHENHIST TAKE 1 tablet AS NEEDED FOR ITCHING.   hydrocortisone 10 MG tablet Commonly known as:  CORTEF Take 10 mg by mouth as directed. Pt takes 10 mg in the am and 5 mg at 11 am and 3 pm   JARDIANCE 10 MG Tabs tablet Generic drug:  empagliflozin Take 10 mg by mouth daily.   metoprolol tartrate 25 MG tablet Commonly known as:  LOPRESSOR Take 1 tablet (25 mg total) by mouth 2 (two) times daily.   montelukast 10 MG tablet Commonly known as:  SINGULAIR Take 1 tablet (10 mg total) by mouth daily.   neomycin-polymyxin-hydrocortisone 3.5-10000-1 OTIC suspension Commonly known as:  CORTISPORIN PLACE THREE DROPS INTO THE LEFT  EAR USE 4 TIMES DAILY FOR 10 DAYS.  nitroGLYCERIN 0.4 MG SL tablet Commonly known as:  NITROSTAT Place 1 tablet (0.4 mg total) under the tongue every 5 (five) minutes as needed for chest pain.   pantoprazole 40 MG tablet Commonly known as:  PROTONIX Take 1 tablet (40 mg total) by mouth daily.   PATADAY 0.2 % Soln Generic drug:  Olopatadine HCl Apply 1 drop to eye daily as needed. For allergies   promethazine 25 MG tablet Commonly known as:  PHENERGAN Take 1 tablet (25 mg total) by mouth every 6 (six) hours as needed for nausea or vomiting.   ranitidine 300 MG tablet Commonly known as:  ZANTAC Take 1  tablet (300 mg total) by mouth at bedtime.   sodium chloride 0.65 % Soln nasal spray Commonly known as:  OCEAN Place 1 spray into both nostrils as needed for congestion.   traMADol 50 MG tablet Commonly known as:  ULTRAM TAKE ONE TABLET BY MOUTH EVERY 12 HOURS AS NEEDED   Vitamin D (Cholecalciferol) 1000 units Caps Take 1,000 mg by mouth daily.       Past Medical History:  Diagnosis Date  . Allergic rhinitis 05/09/2006  . Allergy   . Anxiety   . Arthritis   . Asthma   . CHF (congestive heart failure) ([place])   . Chronic back pain   . Diabetes mellitus without complication ([place])   . GERD (gastroesophageal reflux disease)   . Heart murmur    as a child  . 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 s/p 12 week course of simeprevir and sofosbuvir between October 2014 and January 2015 with resolution.  Marland Kitchen History of shingles   . HIV infection ([place])    1994  . Hyperlipidemia    no meds taken now  . Hypertension   . Pituitary microadenoma ([place]) 08/08/2014  . Prediabetes   . Refusal of blood transfusions as patient is Jehovah's Witness   . Secondary adrenal insufficiency ([place]) 06/29/2014  . Urticaria     Past Surgical History:  Procedure Laterality Date  . BUNIONECTOMY     b/l  . COLECTOMY     2003 for diverticulitis, had colostomy bag and then reversed  . COLONOSCOPY    . HAND SURGERY    . NASAL SINUS SURGERY    . SHOULDER SURGERY     left  . TONSILLECTOMY      Review of systems negative except as noted in HPI / PMHx or noted below:  Review of Systems  Constitutional: Negative.   HENT: Negative.   Eyes: Negative.   Respiratory: Negative.   Cardiovascular: Negative.   Gastrointestinal: Negative.   Genitourinary: Negative.   Musculoskeletal: Negative.   Skin: Negative.   Neurological: Negative.   Endo/Heme/Allergies: Negative.   Psychiatric/Behavioral: Negative.      Objective:   Vitals:   09/12/17 1521  BP: 128/80    Pulse: 68  Resp: 16          Physical Exam  HENT:  Head: Normocephalic.  Right Ear: Tympanic membrane and external ear normal. A foreign body (Tube) is present.  Left Ear: External ear and ear canal normal. Tympanic membrane is perforated.  Nose: Nose normal. No mucosal edema or rhinorrhea.  Mouth/Throat: Uvula is midline, oropharynx is clear and moist and mucous membranes are normal. No oropharyngeal exudate.  Eyes: Conjunctivae are normal.  Neck: Trachea normal. No tracheal tenderness present. No tracheal deviation present. No thyromegaly present.  Cardiovascular: Normal rate, regular  rhythm, S1 normal, S2 normal and normal heart sounds.  No murmur heard. Pulmonary/Chest: Breath sounds normal. No stridor. No respiratory distress. She has no wheezes. She has no rales.  Musculoskeletal: She exhibits no edema.  Lymphadenopathy:       Head (right side): No tonsillar adenopathy present.       Head (left side): No tonsillar adenopathy present.    She has no cervical adenopathy.  Neurological: She is alert.  Skin: No rash noted. She is not diaphoretic. No erythema. Nails show no clubbing.    Diagnostics:    Spirometry was performed and demonstrated an FEV1 of 1.57 at 85 % of predicted.  The patient had an Asthma Control Test with the following results: ACT Total Score: 17.    Assessment and Plan:   1. Asthma, well controlled, mild persistent   2. Other allergic rhinitis   3. Gastroesophageal reflux disease, esophagitis presence not specified   4. Pruritic disorder     1.  Continue to perform allergen avoidance measures  2.  Continue to Treat and prevent inflammation:   A.  Montelukast 10 mg - 1 tablet 1 time per day  B.  Pulmicort 180 -2 inhalations 1 time per day (P.A.)  C.  OTC Nasacort -1 spray each nostril 1 time per day  3.  Continue to Treat and prevent reflux:   A.  Protonix 40 mg in a.m.  B.  ranitidine 300 mg in p.m.  4.  Can use cetirizine 10 mg -1-2  tablets 1-2 times a day (maximum 40mg )  5.  If needed:   A.  OTC Benadryl  B.  OTC nasal saline  C.  Pro Air HFA 2 puffs every 4-6 hours  6.  Return to clinic in 12 weeks or earlier if problem  Mischele is much better on her current plan and I would like to continue to have her use anti-inflammatory agents for her airway and therapy directed against reflux and if required antihistamines for her pruritus.  I will see her back in this clinic in 12 weeks or earlier if there is a problem. There may be an opportunity to to consolidate her medical therapy with that visit.  Allena Katz, MD Allergy / Immunology [place]

## 2017-09-12 NOTE — Patient Instructions (Addendum)
  1.  Continue to perform allergen avoidance measures  2.  Continue to Treat and prevent inflammation:   A.  Montelukast 10 mg - 1 tablet 1 time per day  B.  Pulmicort 180 -2 inhalations 1 time per day (P.A.)  C.  OTC Nasacort -1 spray each nostril 1 time per day  3.  Continue to Treat and prevent reflux:   A.  Protonix 40 mg in a.m.  B.  ranitidine 300 mg in p.m.  4.  Can use cetirizine 10 mg -1-2 tablets 1-2 times a day (maximum 40mg )  5.  If needed:   A.  OTC Benadryl  B.  OTC nasal saline  C.  Pro Air HFA 2 puffs every 4-6 hours  6.  Return to clinic in 12 weeks or earlier if problem

## 2017-09-13 ENCOUNTER — Encounter: Payer: Self-pay | Admitting: Allergy and Immunology

## 2017-09-15 ENCOUNTER — Encounter: Payer: Self-pay | Admitting: *Deleted

## 2017-09-19 ENCOUNTER — Encounter: Payer: Self-pay | Admitting: Physical Medicine & Rehabilitation

## 2017-09-19 ENCOUNTER — Encounter: Payer: Medicaid Other | Attending: Physical Medicine & Rehabilitation

## 2017-09-19 ENCOUNTER — Ambulatory Visit: Payer: Medicaid Other | Admitting: Physical Medicine & Rehabilitation

## 2017-09-19 ENCOUNTER — Other Ambulatory Visit: Payer: Self-pay

## 2017-09-19 VITALS — BP 155/91 | HR 79 | Wt 177.4 lb

## 2017-09-19 DIAGNOSIS — M47816 Spondylosis without myelopathy or radiculopathy, lumbar region: Secondary | ICD-10-CM

## 2017-09-19 DIAGNOSIS — G894 Chronic pain syndrome: Secondary | ICD-10-CM | POA: Diagnosis not present

## 2017-09-19 DIAGNOSIS — Z79891 Long term (current) use of opiate analgesic: Secondary | ICD-10-CM | POA: Diagnosis not present

## 2017-09-19 DIAGNOSIS — G8929 Other chronic pain: Secondary | ICD-10-CM | POA: Diagnosis not present

## 2017-09-19 DIAGNOSIS — Z5181 Encounter for therapeutic drug level monitoring: Secondary | ICD-10-CM | POA: Diagnosis not present

## 2017-09-19 DIAGNOSIS — M533 Sacrococcygeal disorders, not elsewhere classified: Secondary | ICD-10-CM

## 2017-09-19 NOTE — Progress Notes (Signed)
Subjective:    [name], female    DOB: February 14, 1961, 57 y.o.   MRN: [id number]  HPI  57 year old female with history of lumbar spondylosis at L4-5 L5-S1.  She has had successful radiofrequency ablations on several occasions.  Her last L3-L4 medial branch and L5 dorsal ramus radiofrequency neurotomy on the left side was performed on 07/13/2017.  Her last L3-L4 medial branch and L5 dorsal ramus radiofrequency neurotomy on the right side was performed on 03/28/2017. Patient was complaining of right-sided buttocks pain which responded to sacroiliac injection performed on 08/08/2017.  She had very good relief for 2 weeks and has had continued good relief.  Overall pain benefit was approximately 50% Takes tramadol one tab a month, doesn't feel much benefit    Taking Jardiance for newly diagnosed, sees Endocrine. On steroids as well Weight control a challenge with steroids on board  Trying to work out, we discussed pacing and finding out activity tolerance limits.  Patient is HIV positive last CD4 was 810.  Scheduled for repeat blood work next month.  Pain Inventory Average Pain 5 Pain Right Now 9 My pain is sharp, dull, stabbing and aching  In the last 24 hours, has pain interfered with the following? General activity 7 Relation with others 5 Enjoyment of life 5 What TIME of day is your pain at its worst? morning daytime Sleep (in general) Poor  Pain is worse with: walking, bending, standing and some activites Pain improves with: rest, heat/ice and injections Relief from Meds: 3  Mobility walk with assistance use a cane how many minutes can you walk? 10 ability to climb steps?  yes transfers alone  Function Do you have any goals in this area?  no  Neuro/Psych weakness dizziness anxiety  Prior Studies Any changes since last visit?  no  Physicians involved in your care Any changes since last visit?  no   Family History  Problem Relation Age of Onset  .  Heart disease Mother   . Diabetes Mother   . Stroke Mother   . Heart disease Father   . Stroke Father   . Diabetes Father   . Hepatitis Sister        hcv  . Asthma Sister   . Allergic rhinitis Sister   . Stroke Other   . Colon polyps Brother   . Cancer Sister        lung  . Asthma Sister   . Allergic rhinitis Sister   . Cancer Maternal Aunt   . Cancer Maternal Aunt   . Colon cancer Neg Hx   . Esophageal cancer Neg Hx   . Stomach cancer Neg Hx   . Rectal cancer Neg Hx   . Angioedema Neg Hx   . Eczema Neg Hx   . Urticaria Neg Hx    Social History   Socioeconomic History  . Marital status: Single    Spouse name: Not on file  . Number of children: 0  . Years of education: Not on file  . Highest education level: Not on file  Occupational History  . Not on file  Social Needs  . Financial resource strain: Not on file  . Food insecurity:    Worry: Not on file    Inability: Not on file  . Transportation needs:    Medical: Not on file    Non-medical: Not on file  Tobacco Use  . Smoking status: Former Smoker    Years: 20.00  Types: Cigarettes    Last attempt to quit: 04/26/2007    Years since quitting: 10.4  . Smokeless tobacco: Never Used  . Tobacco comment: QUIT 2009  Substance and Sexual Activity  . Alcohol use: No    Alcohol/week: 0.0 oz  . Drug use: No  . Sexual activity: Not on file    Comment: pt. given condoms  Lifestyle  . Physical activity:    Days per week: Not on file    Minutes per session: Not on file  . Stress: Not on file  Relationships  . Social connections:    Talks on phone: Not on file    Gets together: Not on file    Attends religious service: Not on file    Active member of club or organization: Not on file    Attends meetings of clubs or organizations: Not on file    Relationship status: Not on file  Other Topics Concern  . Not on file  Social History Narrative   Alternate # [phone]   Past Surgical History:  Procedure  Laterality Date  . BUNIONECTOMY     b/l  . COLECTOMY     2003 for diverticulitis, had colostomy bag and then reversed  . COLONOSCOPY    . HAND SURGERY    . NASAL SINUS SURGERY    . SHOULDER SURGERY     left  . TONSILLECTOMY     Past Medical History:  Diagnosis Date  . Allergic rhinitis 05/09/2006  . Allergy   . Anxiety   . Arthritis   . Asthma   . CHF (congestive heart failure) ([place])   . Chronic back pain   . Diabetes mellitus without complication ([place])   . GERD (gastroesophageal reflux disease)   . Heart murmur    as a child  . 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 s/p 12 week course of simeprevir and sofosbuvir between October 2014 and January 2015 with resolution.  Marland Kitchen History of shingles   . HIV infection ([place])    1994  . Hyperlipidemia    no meds taken now  . Hypertension   . Pituitary microadenoma ([place]) 08/08/2014  . Prediabetes   . Refusal of blood transfusions as patient is Jehovah's Witness   . Secondary adrenal insufficiency ([place]) 06/29/2014  . Urticaria    BP (!) 155/91   Pulse 79   Wt 177 lb 6.4 oz (80.5 kg)   SpO2 95%   BMI 35.23 kg/m   Opioid Risk Score:   Fall Risk Score:  `1  Depression screen PHQ 2/9  Depression screen [facility] 2/9 09/19/2017 07/21/2017 05/18/2017 03/06/2017 02/16/2017 01/23/2017 01/03/2017  Decreased Interest 0 0 0 0 0 0 0  Down, Depressed, Hopeless 0 0 0 0 0 0 0  PHQ - 2 Score 0 0 0 0 0 0 0  Altered sleeping - - - - - - -  Tired, decreased energy - - - - - - -  Change in appetite - - - - - - -  Feeling bad or failure about yourself  - - - - - - -  Trouble concentrating - - - - - - -  Moving slowly or fidgety/restless - - - - - - -  Suicidal thoughts - - - - - - -  PHQ-9 Score - - - - - - -  Some recent data might be hidden   Review of Systems  Constitutional: Negative for unexpected  weight change.       Night sweats  HENT: Negative.   Eyes: Negative.   Respiratory: Negative.     Gastrointestinal: Positive for abdominal pain, diarrhea and nausea.  Endocrine: Negative.   Genitourinary: Negative.   Musculoskeletal: Negative.   Skin: Negative.   Allergic/Immunologic: Negative.   Neurological: Negative.   Hematological: Negative.   Psychiatric/Behavioral: Negative.   All other systems reviewed and are negative.      Objective:   Physical Exam  Constitutional: She is oriented to person, place, and time. She appears well-developed and well-nourished.  HENT:  Head: Normocephalic and atraumatic.  Eyes: Pupils are equal, round, and reactive to light. EOM are normal.  Neck: Normal range of motion.  Neurological: She is alert and oriented to person, place, and time.  Skin: Skin is warm and dry.  Psychiatric: She has a normal mood and affect.  Nursing note and vitals reviewed. Patient has tenderness palpation right PSIS she has no tenderness along the lumbar paraspinal muscles.  She has no pain with lumbar flexion extension lateral bending or rotation. Negative straight leg raising Gait is without evidence of toe drag or knee instability.        Assessment & Plan:  #1.  Lumbar spondylosis without myelopathy.  She has continuing relief from her lumbar radiofrequency neurotomies at L3-L4 medial branches and L5 dorsal ramus. Right side can be repeated anytime given that it is greater than 6 months once symptoms start to recur. 2.  Sacroiliac disorder has had good relief with sacroiliac injection.  Has been prescribed tramadol but really is not using this very often.  She states that maybe she will take 1 a month and still has quite a few tablets left.  At this point we will not prescribe any more of these and therefore patient will not need to sign a controlled substance agreement or undergo urine drug screen testing.  If in the future she is on regular prescriptions of this medication she will need to comply with the monitoring program.

## 2017-10-02 ENCOUNTER — Ambulatory Visit: Payer: Medicaid Other | Admitting: Cardiology

## 2017-10-02 ENCOUNTER — Other Ambulatory Visit: Payer: Self-pay | Admitting: Internal Medicine

## 2017-11-02 ENCOUNTER — Encounter: Payer: Medicaid Other | Admitting: Internal Medicine

## 2017-11-03 ENCOUNTER — Other Ambulatory Visit: Payer: Self-pay | Admitting: Physical Medicine & Rehabilitation

## 2017-11-09 ENCOUNTER — Other Ambulatory Visit: Payer: Self-pay

## 2017-11-09 MED ORDER — PROAIR HFA 108 (90 BASE) MCG/ACT IN AERS: INHALATION_SPRAY | RESPIRATORY_TRACT | 1 refills | Status: DC

## 2017-11-16 ENCOUNTER — Other Ambulatory Visit: Payer: Medicaid Other

## 2017-11-16 ENCOUNTER — Other Ambulatory Visit (HOSPITAL_COMMUNITY)
Admission: RE | Admit: 2017-11-16 | Discharge: 2017-11-16 | Disposition: A | Discharge status: Home / Self Care | Payer: Medicaid Other | Source: Ambulatory Visit | Attending: Infectious Diseases | Admitting: Infectious Diseases

## 2017-11-16 DIAGNOSIS — Z113 Encounter for screening for infections with a predominantly sexual mode of transmission: Secondary | ICD-10-CM | POA: Diagnosis not present

## 2017-11-16 DIAGNOSIS — Z79899 Other long term (current) drug therapy: Secondary | ICD-10-CM

## 2017-11-16 DIAGNOSIS — B20 Human immunodeficiency virus [HIV] disease: Secondary | ICD-10-CM

## 2017-11-17 LAB — LIPID PANEL
CHOLESTEROL: 149 mg/dL (ref <200)
HDL: 36 mg/dL — AB (ref >50)
LDL Cholesterol (Calc): 76 mg/dL (calc)
Non-HDL Cholesterol (Calc): 113 mg/dL (calc) (ref <130)
TRIGLYCERIDES: 308 mg/dL — AB (ref <150)
Total CHOL/HDL Ratio: 4.1 (calc) (ref <5.0)

## 2017-11-17 LAB — COMPREHENSIVE METABOLIC PANEL
AG Ratio: 1.4 (calc) (ref 1.0–2.5)
ALBUMIN MSPROF: 4.5 g/dL (ref 3.6–5.1)
ALKALINE PHOSPHATASE (APISO): 74 U/L (ref 33–130)
ALT: 19 U/L (ref 6–29)
AST: 16 U/L (ref 10–35)
BILIRUBIN TOTAL: 0.5 mg/dL (ref 0.2–1.2)
BUN: 8 mg/dL (ref 7–25)
CHLORIDE: 100 mmol/L (ref 98–110)
CO2: 26 mmol/L (ref 20–32)
Calcium: 9.8 mg/dL (ref 8.6–10.4)
Creat: 0.76 mg/dL (ref 0.50–1.05)
GLOBULIN: 3.3 g/dL (ref 1.9–3.7)
Glucose, Bld: 204 mg/dL — ABNORMAL HIGH (ref 65–99)
POTASSIUM: 3.5 mmol/L (ref 3.5–5.3)
Sodium: 137 mmol/L (ref 135–146)
Total Protein: 7.8 g/dL (ref 6.1–8.1)

## 2017-11-17 LAB — CBC
HEMATOCRIT: 42.4 % (ref 35.0–45.0)
Hemoglobin: 14.1 g/dL (ref 11.7–15.5)
MCH: 26.7 pg — AB (ref 27.0–33.0)
MCHC: 33.3 g/dL (ref 32.0–36.0)
MCV: 80.2 fL (ref 80.0–100.0)
MPV: 11.2 fL (ref 7.5–12.5)
Platelets: 258 10*3/uL (ref 140–400)
RBC: 5.29 10*6/uL — ABNORMAL HIGH (ref 3.80–5.10)
RDW: 15.2 % — AB (ref 11.0–15.0)
WBC: 9.6 10*3/uL (ref 3.8–10.8)

## 2017-11-17 LAB — RPR: RPR Ser Ql: NONREACTIVE

## 2017-11-17 LAB — T-HELPER CELL (CD4) - (RCID CLINIC ONLY)
CD4 % Helper T Cell: 22 % — ABNORMAL LOW (ref 33–55)
CD4 T Cell Abs: 610 /uL (ref 400–2700)

## 2017-11-17 LAB — URINE CYTOLOGY ANCILLARY ONLY
Chlamydia: NEGATIVE
Neisseria Gonorrhea: NEGATIVE

## 2017-11-20 ENCOUNTER — Other Ambulatory Visit: Payer: Medicaid Other

## 2017-11-20 ENCOUNTER — Ambulatory Visit: Payer: Medicaid Other | Admitting: Physical Medicine & Rehabilitation

## 2017-11-20 LAB — HIV-1 RNA QUANT-NO REFLEX-BLD
HIV 1 RNA Quant: <20 copies/mL
HIV-1 RNA Quant, Log: <1.3 Log copies/mL

## 2017-11-22 NOTE — Progress Notes (Signed)
   CC: Follow up on essential hypertension   HPI:  [name] is a 57 y.o. female with history noted below that presents to the [facility] for follow up on essential hypertension.  Please see problem based charting for the status of patient's chronic medical conditions.    Past Medical History:  Diagnosis Date  . Allergic rhinitis 05/09/2006  . Allergy   . Anxiety   . Arthritis   . Asthma   . CHF (congestive heart failure) ([place])   . Chronic back pain   . Diabetes mellitus without complication ([place])   . GERD (gastroesophageal reflux disease)   . Heart murmur    as a child  . 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 s/p 12 week course of simeprevir and sofosbuvir between October 2014 and January 2015 with resolution.  Marland Kitchen History of shingles   . HIV infection ([place])    1994  . Hyperlipidemia    no meds taken now  . Hypertension   . Pituitary microadenoma ([place]) 08/08/2014  . Prediabetes   . Refusal of blood transfusions as patient is Jehovah's Witness   . Secondary adrenal insufficiency ([place]) 06/29/2014  . Urticaria     Review of Systems:  Review of Systems  Constitutional: Negative for malaise/fatigue.  Cardiovascular: Negative for leg swelling.     Physical Exam:  Vitals:   11/28/17 1326  BP: (!) 152/84  Pulse: 85  Temp: 98.2 F (36.8 C)  TempSrc: Oral  SpO2: 98%  Weight: 178 lb 12.8 oz (81.1 kg)   Physical Exam  Constitutional: She is well-developed, well-nourished, and in no distress.  Cardiovascular: Normal rate, regular rhythm and normal heart sounds. Exam reveals no gallop and no friction rub.  No murmur heard. Pulmonary/Chest: Effort normal and breath sounds normal. No respiratory distress. She has no wheezes. She has no rales.     Assessment & Plan:   See encounters tab for problem based medical decision making.    Patient discussed with Dr. Dareen Piano

## 2017-11-28 ENCOUNTER — Encounter: Payer: Medicaid Other | Admitting: Internal Medicine

## 2017-11-28 ENCOUNTER — Encounter: Payer: Self-pay | Admitting: Internal Medicine

## 2017-11-28 ENCOUNTER — Ambulatory Visit: Payer: Medicaid Other | Admitting: Internal Medicine

## 2017-11-28 VITALS — BP 158/84 | HR 82 | Temp 98.2°F | Wt 178.8 lb

## 2017-11-28 DIAGNOSIS — Z79899 Other long term (current) drug therapy: Secondary | ICD-10-CM | POA: Diagnosis not present

## 2017-11-28 DIAGNOSIS — T380X5A Adverse effect of glucocorticoids and synthetic analogues, initial encounter: Secondary | ICD-10-CM | POA: Diagnosis not present

## 2017-11-28 DIAGNOSIS — T380X5S Adverse effect of glucocorticoids and synthetic analogues, sequela: Secondary | ICD-10-CM | POA: Diagnosis not present

## 2017-11-28 DIAGNOSIS — I10 Essential (primary) hypertension: Secondary | ICD-10-CM

## 2017-11-28 DIAGNOSIS — E099 Drug or chemical induced diabetes mellitus without complications: Secondary | ICD-10-CM

## 2017-11-28 LAB — POCT GLYCOSYLATED HEMOGLOBIN (HGB A1C): HEMOGLOBIN A1C: 6.8 % — AB (ref 4.0–5.6)

## 2017-11-28 LAB — GLUCOSE, CAPILLARY: GLUCOSE-CAPILLARY: 117 mg/dL — AB (ref 70–99)

## 2017-11-28 NOTE — Patient Instructions (Signed)
Ms. Sandin,  It was a pleasure seeing you today. Please follow up in 6 months or call if you need to be seen sooner.

## 2017-11-30 ENCOUNTER — Encounter: Payer: Medicaid Other | Admitting: Internal Medicine

## 2017-11-30 NOTE — Assessment & Plan Note (Signed)
Assessment: Essential hypertension Patient currently takes metoprolol 25mg  BID and amlodipine 10mg  daily. Today/pressure is 152/84 and similar on repeat.  At this time discussed adding an additional blood pressure medication.  Patient has had intolerance to lisinopril, losartan, HCTZ and spironolactone in the past.  She states she would not like to make medication changes at this time but is open if blood pressure continues to be high.  Per chart review patient has had blood pressures at goal on several clinic visits this year.  Most recently in may 2019.  I think it is reasonable not to make changes right now.  We discussed possibly increasing Metoprolol in the future if blood pressure is not at goal of 140/80.  Plan -Continue metoprolol25mg  BIDand amlodipine10mg  daily -consider increasing metoprolol if bp still uncontrolled at future visits

## 2017-11-30 NOTE — Assessment & Plan Note (Signed)
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 Has tried metformin and jardiance and stopped because was not able to tolerate. Hemoglobin A1C 6.8 today.  Patient has follow up with endo at the end of the month.  Plan -follow along per chart review

## 2017-12-01 ENCOUNTER — Other Ambulatory Visit: Payer: Self-pay | Admitting: Internal Medicine

## 2017-12-01 NOTE — Addendum Note (Signed)
Addended by: Aldine Contes on: 12/01/2017 10:23 AM   Modules accepted: Level of Service

## 2017-12-01 NOTE — Progress Notes (Signed)
Ok thank you 

## 2017-12-01 NOTE — Progress Notes (Signed)
Internal Medicine Clinic Attending  Case discussed with Dr. Hoffman at the time of the visit.  We reviewed the resident's history and exam and pertinent patient test results.  I agree with the assessment, diagnosis, and plan of care documented in the resident's note.  

## 2017-12-04 ENCOUNTER — Encounter: Payer: Self-pay | Admitting: Physical Medicine & Rehabilitation

## 2017-12-04 ENCOUNTER — Ambulatory Visit: Payer: Medicaid Other | Admitting: Physical Medicine & Rehabilitation

## 2017-12-04 ENCOUNTER — Ambulatory Visit: Payer: Medicaid Other | Admitting: Infectious Diseases

## 2017-12-04 ENCOUNTER — Other Ambulatory Visit: Payer: Self-pay

## 2017-12-04 ENCOUNTER — Encounter: Payer: Medicaid Other | Attending: Physical Medicine & Rehabilitation

## 2017-12-04 VITALS — BP 132/82 | HR 73 | Ht 61.0 in | Wt 176.8 lb

## 2017-12-04 DIAGNOSIS — M47816 Spondylosis without myelopathy or radiculopathy, lumbar region: Secondary | ICD-10-CM | POA: Diagnosis not present

## 2017-12-04 DIAGNOSIS — M533 Sacrococcygeal disorders, not elsewhere classified: Secondary | ICD-10-CM | POA: Diagnosis not present

## 2017-12-04 DIAGNOSIS — G8929 Other chronic pain: Secondary | ICD-10-CM | POA: Diagnosis not present

## 2017-12-04 NOTE — Progress Notes (Signed)
Subjective:    [name], female    DOB: 1960/12/25, 57 y.o.   MRN: [id number]  HPI Chief complaint is right-sided buttock and low back pain 57 year old female with prior history of HIV which has now been controlled with nondetectable viral load and CD4 count of 600. Patient has had right L3-L4 medial branch radiofrequency neurotomy as well as right L5 dorsal ramus radiofrequency neurotomy was performed 03/28/2017. The left L3-L4 medial branch and left L5 dorsal ramus radiofrequency neurotomy was performed 07/13/2017 Patient has had no new trauma to that area.  She has no lower extremity numbness or weakness.  No new bowel or bladder dysfunction  Pain Inventory Average Pain 7 Pain Right Now 10 My pain is sharp, dull, stabbing, aching and worse  In the last 24 hours, has pain interfered with the following? General activity 6 Relation with others 4 Enjoyment of life 9 What TIME of day is your pain at its worst? morning evening and night Sleep (in general) NA  Pain is worse with: walking, bending, standing and some activites Pain improves with: injections Relief from Meds: 0  Mobility walk without assistance use a cane how many minutes can you walk? 10-15 ability to climb steps?  yes do you drive?  no  Function I need assistance with the following:  household duties and shopping  Neuro/Psych weakness numbness trouble walking dizziness anxiety  Prior Studies Any changes since last visit?  no  Physicians involved in your care Any changes since last visit?  no   Family History  Problem Relation Age of Onset  . Heart disease Mother   . Diabetes Mother   . Stroke Mother   . Heart disease Father   . Stroke Father   . Diabetes Father   . Hepatitis Sister        hcv  . Asthma Sister   . Allergic rhinitis Sister   . Stroke Other   . Colon polyps Brother   . Cancer Sister        lung  . Asthma Sister   . Allergic rhinitis Sister   . Cancer  Maternal Aunt   . Cancer Maternal Aunt   . Colon cancer Neg Hx   . Esophageal cancer Neg Hx   . Stomach cancer Neg Hx   . Rectal cancer Neg Hx   . Angioedema Neg Hx   . Eczema Neg Hx   . Urticaria Neg Hx    Social History   Socioeconomic History  . Marital status: Single    Spouse name: Not on file  . Number of children: 0  . Years of education: Not on file  . Highest education level: Not on file  Occupational History  . Not on file  Social Needs  . Financial resource strain: Not on file  . Food insecurity:    Worry: Not on file    Inability: Not on file  . Transportation needs:    Medical: Not on file    Non-medical: Not on file  Tobacco Use  . Smoking status: Former Smoker    Years: 20.00    Types: Cigarettes    Last attempt to quit: 04/26/2007    Years since quitting: 10.6  . Smokeless tobacco: Never Used  . Tobacco comment: QUIT 2009  Substance and Sexual Activity  . Alcohol use: No    Alcohol/week: 0.0 standard drinks  . Drug use: No  . Sexual activity: Not on file    Comment: pt.  given condoms  Lifestyle  . Physical activity:    Days per week: Not on file    Minutes per session: Not on file  . Stress: Not on file  Relationships  . Social connections:    Talks on phone: Not on file    Gets together: Not on file    Attends religious service: Not on file    Active member of club or organization: Not on file    Attends meetings of clubs or organizations: Not on file    Relationship status: Not on file  Other Topics Concern  . Not on file  Social History Narrative   Alternate # [phone]   Past Surgical History:  Procedure Laterality Date  . BUNIONECTOMY     b/l  . COLECTOMY     2003 for diverticulitis, had colostomy bag and then reversed  . COLONOSCOPY    . HAND SURGERY    . NASAL SINUS SURGERY    . SHOULDER SURGERY     left  . TONSILLECTOMY     Past Medical History:  Diagnosis Date  . Allergic rhinitis 05/09/2006  . Allergy   . Anxiety     . Arthritis   . Asthma   . CHF (congestive heart failure) ([place])   . Chronic back pain   . Diabetes mellitus without complication ([place])   . GERD (gastroesophageal reflux disease)   . Heart murmur    as a child  . 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 s/p 12 week course of simeprevir and sofosbuvir between October 2014 and January 2015 with resolution.  Marland Kitchen History of shingles   . HIV infection ([place])    1994  . Hyperlipidemia    no meds taken now  . Hypertension   . Pituitary microadenoma ([place]) 08/08/2014  . Prediabetes   . Refusal of blood transfusions as patient is Jehovah's Witness   . Secondary adrenal insufficiency ([place]) 06/29/2014  . Urticaria    BP 132/82   Pulse 73   Ht 5\' 1"  (1.549 m) Comment: reported  Wt 176 lb 12.8 oz (80.2 kg)   SpO2 93%   BMI 33.41 kg/m   Opioid Risk Score:   Fall Risk Score:  `1  Depression screen PHQ 2/9  Depression screen [facility] 2/9 12/04/2017 11/28/2017 09/19/2017 07/21/2017 05/18/2017 03/06/2017 02/16/2017  Decreased Interest 0 0 0 0 0 0 0  Down, Depressed, Hopeless 0 0 0 0 0 0 0  PHQ - 2 Score 0 0 0 0 0 0 0  Altered sleeping - - - - - - -  Tired, decreased energy - - - - - - -  Change in appetite - - - - - - -  Feeling bad or failure about yourself  - - - - - - -  Trouble concentrating - - - - - - -  Moving slowly or fidgety/restless - - - - - - -  Suicidal thoughts - - - - - - -  PHQ-9 Score - - - - - - -  Some recent data might be hidden   Review of Systems  Constitutional: Positive for appetite change, diaphoresis and unexpected weight change.  HENT: Negative.   Eyes: Negative.   Respiratory: Positive for shortness of breath.   Gastrointestinal: Positive for diarrhea and nausea.  Endocrine: Negative.   Genitourinary: Negative.   Musculoskeletal: Positive for gait problem.  Skin: Positive for rash.  Allergic/Immunologic: Negative.   Neurological:  Positive for dizziness, weakness and numbness.   Hematological: Negative.   Psychiatric/Behavioral: The patient is nervous/anxious.   All other systems reviewed and are negative.      Objective:   Physical Exam  Constitutional: She is oriented to person, place, and time. She appears well-developed and well-nourished. No distress.  HENT:  Head: Normocephalic and atraumatic.  Eyes: Pupils are equal, round, and reactive to light. EOM are normal.  Neck: Normal range of motion.  Musculoskeletal:  There is no pain above L5 there is pain at L5 paraspinal as well as PSIS area on the right side only.  No tenderness over the greater trochanter of the hip  Neurological: She is alert and oriented to person, place, and time. No sensory deficit. Coordination and gait normal.  Negative straight leg raising Motor strength is 5/5 bilateral hip flexor knee extensor ankle dorsiflexor Gait is without evidence of toe drag or knee instability  Skin: Skin is warm and dry. She is not diaphoretic.  Psychiatric: She has a normal mood and affect.  Nursing note and vitals reviewed.         Assessment & Plan:  1.  Chronic right sacroiliac pain given that her pain does not go cephalad from L5, this is most likely the etiology. Had good relief for a number of months with right sacroiliac injection last performed in April 2019.  Schedule for repeat.  Patient is no longer taking narcotic analgesics so that we do not need to perform any further urine drug screens or scan the PMP aware website.

## 2017-12-05 ENCOUNTER — Ambulatory Visit: Payer: Medicaid Other | Admitting: Infectious Diseases

## 2017-12-05 ENCOUNTER — Ambulatory Visit: Payer: Medicaid Other | Admitting: Allergy and Immunology

## 2017-12-06 DIAGNOSIS — H6983 Other specified disorders of Eustachian tube, bilateral: Secondary | ICD-10-CM | POA: Diagnosis not present

## 2017-12-08 ENCOUNTER — Telehealth: Payer: Self-pay | Admitting: Internal Medicine

## 2017-12-08 NOTE — Telephone Encounter (Signed)
[name] [name].  Patient last seen on 09/29/2017.  Records rec'd and given to [facility].

## 2017-12-12 ENCOUNTER — Ambulatory Visit: Payer: Medicaid Other | Admitting: Allergy and Immunology

## 2017-12-12 ENCOUNTER — Encounter: Payer: Self-pay | Admitting: Allergy and Immunology

## 2017-12-12 VITALS — BP 126/80 | HR 72 | Resp 16

## 2017-12-12 DIAGNOSIS — J4530 Mild persistent asthma, uncomplicated: Secondary | ICD-10-CM

## 2017-12-12 DIAGNOSIS — L299 Pruritus, unspecified: Secondary | ICD-10-CM | POA: Diagnosis not present

## 2017-12-12 DIAGNOSIS — J3089 Other allergic rhinitis: Secondary | ICD-10-CM | POA: Diagnosis not present

## 2017-12-12 DIAGNOSIS — K219 Gastro-esophageal reflux disease without esophagitis: Secondary | ICD-10-CM | POA: Diagnosis not present

## 2017-12-12 NOTE — Patient Instructions (Addendum)
  1.  Continue to perform allergen avoidance measures  2.  Continue to Treat and prevent inflammation:   A.  Montelukast 10 mg - 1 tablet 1 time per day  B.  Pulmicort 180 -2 inhalations 1 time per day (P.A.)  C.  OTC Nasacort -1 spray each nostril 1 time per day  3.  Continue to Treat and prevent reflux:   A.  Protonix 40 mg in a.m.  B.  ranitidine 300 mg in p.m.  4.  Can use cetirizine 10 mg -1-2 tablets 1-2 times a day (maximum 40mg )  5.  If needed:   A.  OTC Benadryl  B.  OTC nasal saline  C.  Pro Air HFA 2 puffs every 4-6 hours  6.  Return to clinic in January 2020 or earlier if problem  7.  Obtain fall flu vaccine

## 2017-12-12 NOTE — Progress Notes (Signed)
Follow-up Note  Referring Provider: Kalman Shan Ratlif* Primary Provider: [name], DO Date of Office Visit: 12/12/2017  Subjective:   [name] [name] (DOB: [birth year]) is a 57 y.o. female who returns to the [facility] on 12/12/2017 in re-evaluation of the following:  HPI: [name] [name] returns to this clinic in reevaluation of asthma and allergic rhinitis and pruritus and headache and reflux.  I last saw her in this clinic on 12 Sep 2017.  She has continued to do very well regarding her airway.  Rarely does she use a short acting bronchodilator except when she is outdoors and exerting herself in the heat.  She believes that she has had excellent control of her asthma during this interval.  She has not needed a steroid to treat an exacerbation of asthma.  She continues to use Pulmicort and a leukotriene modifier consistently.  She has had very little problems with her nose.  She has not required an antibiotic to treat an episode of sinusitis.  She has not been having any headaches.  She has been consistently using Nasacort and a leukotriene modifier.  Reflux and her throat have been under excellent control on her current plan for treating reflux which includes a proton pump inhibitor and H2 receptor blocker.  She has had very little issues with pruritus recently.  She uses cetirizine 10 mg daily.  She did visit with Dr. Erik Obey yesterday and had a tube removed from her right ear.  Allergies as of 12/12/2017      Reactions   Acetaminophen Other (See Comments)   Inflamed liver, hospitalized REACTION: Had liver problems, was hospitalized Inflamed liver, hospitalized   Morphine Hives, Rash   REACTION: rash   Triamterene Hives   Dyazide [hydrochlorothiazide W-triamterene] Hives   Lisinopril    Cough    Losartan Rash   Pt had rash, worsening dizziness, and nausea after starting losartan, which improved after stopping losartan      Medication List        amLODipine 10 MG tablet Commonly known as:  NORVASC Take 1 tablet (10 mg total) by mouth daily.   atorvastatin 10 MG tablet Commonly known as:  LIPITOR Take 1 tablet (10 mg total) by mouth daily.   BIKTARVY 50-200-25 MG Tabs tablet Generic drug:  bictegravir-emtricitabine-tenofovir AF TAKE 1 TABLET BY MOUTH DAILY.   budesonide 180 MCG/ACT inhaler Commonly known as:  PULMICORT Inhale 2 puffs into the lungs daily.   cetirizine 10 MG tablet Commonly known as:  ZYRTEC TAKE 1 TABLET BY MOUTH EVERY DAY AS NEEDED FOR ALLERGIES   diazepam 5 MG tablet Commonly known as:  VALIUM TAKE ONE TABLET BY MOUTH AT BEDTIME AS NEEDED FOR ANXIETY   diphenhydrAMINE 25 MG tablet Commonly known as:  BENADRYL TAKE 1 tablet AS NEEDED FOR ITCHING.   hydrocortisone 10 MG tablet Commonly known as:  CORTEF Take 10 mg by mouth as directed. Pt takes 10 mg in the am and 5 mg at 11 am and 3 pm   JARDIANCE 10 MG Tabs tablet Generic drug:  empagliflozin Take 10 mg by mouth daily.   metoprolol tartrate 25 MG tablet Commonly known as:  LOPRESSOR Take 1 tablet (25 mg total) by mouth 2 (two) times daily.   montelukast 10 MG tablet Commonly known as:  SINGULAIR Take 1 tablet (10 mg total) by mouth daily.   neomycin-polymyxin-hydrocortisone 3.5-10000-1 OTIC suspension Commonly known as:  CORTISPORIN PLACE THREE DROPS INTO THE LEFT  EAR USE  4 TIMES DAILY FOR 10 DAYS.   nitroGLYCERIN 0.4 MG SL tablet Commonly known as:  NITROSTAT Place 1 tablet (0.4 mg total) under the tongue every 5 (five) minutes as needed for chest pain.   pantoprazole 40 MG tablet Commonly known as:  PROTONIX Take 1 tablet (40 mg total) by mouth daily.   PATADAY 0.2 % Soln Generic drug:  Olopatadine HCl Apply 1 drop to eye daily as needed. For allergies   PROAIR HFA 108 (90 Base) MCG/ACT inhaler Generic drug:  albuterol Inhale two puffs every four to six hours as needed for cough or wheeze.   promethazine 25 MG  tablet Commonly known as:  PHENERGAN Take 1 tablet (25 mg total) by mouth every 6 (six) hours as needed for nausea or vomiting.   ranitidine 300 MG tablet Commonly known as:  ZANTAC Take 1 tablet (300 mg total) by mouth at bedtime.   sodium chloride 0.65 % Soln nasal spray Commonly known as:  OCEAN Place 1 spray into both nostrils as needed for congestion.   Vitamin D (Cholecalciferol) 1000 units Caps Take 1,000 mg by mouth daily.       Past Medical History:  Diagnosis Date  . Allergic rhinitis 05/09/2006  . Allergy   . Anxiety   . Arthritis   . Asthma   . CHF (congestive heart failure) ([place])   . Chronic back pain   . Diabetes mellitus without complication ([place])   . GERD (gastroesophageal reflux disease)   . Heart murmur    as a child  . 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 s/p 12 week course of simeprevir and sofosbuvir between October 2014 and January 2015 with resolution.  Marland Kitchen History of shingles   . HIV infection ([place])    1994  . Hyperlipidemia    no meds taken now  . Hypertension   . Pituitary microadenoma ([place]) 08/08/2014  . Prediabetes   . Refusal of blood transfusions as patient is Jehovah's Witness   . Secondary adrenal insufficiency ([place]) 06/29/2014  . Urticaria     Past Surgical History:  Procedure Laterality Date  . BUNIONECTOMY     b/l  . COLECTOMY     2003 for diverticulitis, had colostomy bag and then reversed  . COLONOSCOPY    . HAND SURGERY    . NASAL SINUS SURGERY    . SHOULDER SURGERY     left  . TONSILLECTOMY      Review of systems negative except as noted in HPI / PMHx or noted below:  Review of Systems  Constitutional: Negative.   HENT: Negative.   Eyes: Negative.   Respiratory: Negative.   Cardiovascular: Negative.   Gastrointestinal: Negative.   Genitourinary: Negative.   Musculoskeletal: Negative.   Skin: Negative.   Neurological: Negative.   Endo/Heme/Allergies: Negative.    Psychiatric/Behavioral: Negative.      Objective:   Vitals:   12/12/17 1458  BP: 126/80  Pulse: 72  Resp: 16          Physical Exam  HENT:  Head: Normocephalic.  Right Ear: External ear and ear canal normal. Tympanic membrane is perforated.  Left Ear: External ear and ear canal normal. Tympanic membrane is perforated.  Nose: Nose normal. No mucosal edema or rhinorrhea.  Mouth/Throat: Uvula is midline, oropharynx is clear and moist and mucous membranes are normal. No oropharyngeal exudate.  Eyes: Conjunctivae are normal.  Neck: Trachea normal. No tracheal tenderness present. No  tracheal deviation present. No thyromegaly present.  Cardiovascular: Normal rate, regular rhythm, S1 normal, S2 normal and normal heart sounds.  No murmur heard. Pulmonary/Chest: Breath sounds normal. No stridor. No respiratory distress. She has no wheezes. She has no rales.  Musculoskeletal: She exhibits no edema.  Lymphadenopathy:       Head (right side): No tonsillar adenopathy present.       Head (left side): No tonsillar adenopathy present.    She has no cervical adenopathy.  Neurological: She is alert.  Skin: No rash noted. She is not diaphoretic. No erythema. Nails show no clubbing.    Diagnostics:    Spirometry was performed and demonstrated an FEV1 of 1.58 at 86 % of predicted.  Assessment and Plan:   1. Asthma, well controlled, mild persistent   2. Other allergic rhinitis   3. Gastroesophageal reflux disease, esophagitis presence not specified   4. Pruritic disorder     1.  Continue to perform allergen avoidance measures  2.  Continue to Treat and prevent inflammation:   A.  Montelukast 10 mg - 1 tablet 1 time per day  B.  Pulmicort 180 -2 inhalations 1 time per day (P.A.)  C.  OTC Nasacort -1 spray each nostril 1 time per day  3.  Continue to Treat and prevent reflux:   A.  Protonix 40 mg in a.m.  B.  ranitidine 300 mg in p.m.  4.  Can use cetirizine 10 mg -1-2 tablets  1-2 times a day (maximum 40mg )  5.  If needed:   A.  OTC Benadryl  B.  OTC nasal saline  C.  Pro Air HFA 2 puffs every 4-6 hours  6.  Return to clinic in January 2020 or earlier if problem  7.  Obtain fall flu vaccine  Overall [name] [name] is really doing quite well on her current plan which includes low-dose anti-inflammatory agents for her respiratory tract and therapy directed against reflux and an antihistamine.  Assuming she continues to do well with this plan I will see her back in this clinic in January 2020 or earlier if there is a problem.  Allena Katz, MD Allergy / Immunology [place]

## 2017-12-13 ENCOUNTER — Encounter: Payer: Self-pay | Admitting: Infectious Diseases

## 2017-12-13 ENCOUNTER — Encounter: Payer: Self-pay | Admitting: Allergy and Immunology

## 2017-12-13 ENCOUNTER — Ambulatory Visit (INDEPENDENT_AMBULATORY_CARE_PROVIDER_SITE_OTHER): Payer: Medicaid Other | Admitting: Infectious Diseases

## 2017-12-13 VITALS — BP 152/88 | HR 83 | Temp 98.1°F | Wt 178.0 lb

## 2017-12-13 DIAGNOSIS — B20 Human immunodeficiency virus [HIV] disease: Secondary | ICD-10-CM

## 2017-12-13 DIAGNOSIS — I10 Essential (primary) hypertension: Secondary | ICD-10-CM

## 2017-12-13 DIAGNOSIS — Z79899 Other long term (current) drug therapy: Secondary | ICD-10-CM

## 2017-12-13 DIAGNOSIS — Z113 Encounter for screening for infections with a predominantly sexual mode of transmission: Secondary | ICD-10-CM | POA: Diagnosis not present

## 2017-12-13 DIAGNOSIS — B182 Chronic viral hepatitis C: Secondary | ICD-10-CM

## 2017-12-13 DIAGNOSIS — Z23 Encounter for immunization: Secondary | ICD-10-CM | POA: Diagnosis not present

## 2017-12-13 DIAGNOSIS — J309 Allergic rhinitis, unspecified: Secondary | ICD-10-CM | POA: Diagnosis not present

## 2017-12-13 NOTE — Assessment & Plan Note (Signed)
Appreciate f/u with Dr Neldon Mc.  She is doing well.

## 2017-12-13 NOTE — Assessment & Plan Note (Signed)
She was cured of this but on her u/s in 2016 she had a lesion. Will repeat.

## 2017-12-13 NOTE — Progress Notes (Signed)
   Subjective:    [name], female    DOB: 09/23/1960, 57 y.o.   MRN: [id number]  HPI 57yo F with hx of Hep C (tx at [facility] completed 04-2013, repeat Hep C RNA -) and HIV+ (prev Lexiva-norvir/Truvada). She had nasal septoplasty 01-05-10. She was previously changed to [place] in 2013. She was then changed to truvada-tivicay in anticipation of starting Hep C treatment.  She developed a rash on this and was then changed to issentress/truvada (which she takes with hydroxizine due to pruritis, rash).  She was changed to evotaz/truvada which her rash persisted on, then was taken off ART. Was seen December 2015 and restarted ART due to low CD4 (prezcobix/truvada).  Was hospitalized in April 2016 and found to have pituitary adenoma (being watched), addison's disease. DM2  Her dizziness has improved- she is doing exercises with vestibular therapist.   Having rashes. Having bone aches. She still gets lumbar injections.  Restless, sleeps poorly (takes valium prn for anxiety).  Was started on po rx for DM2 but is now off due to sfx. FSG have been 177 (AM), never > 180.  Hard to exercise due to her her back pain, has changed her diet.   HIV 1 RNA Quant (copies/mL)  Date Value  11/16/2017 <20 NOT DETECTED  02/21/2017 31 (H)  07/12/2016 <20 DETECTED (A)   CD4 T Cell Abs (/uL)  Date Value  11/16/2017 610  02/21/2017 810  07/12/2016 640     Review of Systems  Constitutional: Positive for fatigue. Negative for appetite change and unexpected weight change.  Musculoskeletal: Positive for arthralgias and back pain.  Skin: Positive for rash.  Neurological: Positive for dizziness.  Please see HPI. All other systems reviewed and negative.      Objective:   Physical Exam  Constitutional: She is oriented to person, place, and time. She appears well-developed and well-nourished.  HENT:  Mouth/Throat: No oropharyngeal exudate.  Eyes: Pupils are equal, round, and reactive to  light. EOM are normal.  Neck: Normal range of motion. Neck supple.  Cardiovascular: Normal rate, regular rhythm and normal heart sounds.  Pulmonary/Chest: Effort normal and breath sounds normal.  Abdominal: Soft. Bowel sounds are normal. She exhibits distension. There is no tenderness.  Musculoskeletal: She exhibits no edema.  Neurological: She is alert and oriented to person, place, and time.  Skin: Skin is warm and dry.  Psychiatric: She has a normal mood and affect.      Assessment & Plan:

## 2017-12-13 NOTE — Assessment & Plan Note (Signed)
She is dong well, multiple comorbidities from long term HIV.  Will give her PCV 13 today Will arrange PAP Will repeat her liver u/s Will see her back in 9 months.

## 2017-12-13 NOTE — Assessment & Plan Note (Signed)
Greatly appreciate IM f/u.  

## 2017-12-13 NOTE — Addendum Note (Signed)
Addended by: Aundria Rud on: 12/13/2017 11:52 AM   Modules accepted: Orders

## 2017-12-15 NOTE — Telephone Encounter (Addendum)
Verified with [facility] pharmacist that this Rx had not been phoned in. Phoned in today. 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 Hubbard Hartshorn, RN, BSN

## 2017-12-21 DIAGNOSIS — E2749 Other adrenocortical insufficiency: Secondary | ICD-10-CM | POA: Diagnosis not present

## 2017-12-21 DIAGNOSIS — E119 Type 2 diabetes mellitus without complications: Secondary | ICD-10-CM | POA: Diagnosis not present

## 2017-12-21 DIAGNOSIS — D352 Benign neoplasm of pituitary gland: Secondary | ICD-10-CM | POA: Diagnosis not present

## 2017-12-21 DIAGNOSIS — Z79899 Other long term (current) drug therapy: Secondary | ICD-10-CM | POA: Diagnosis not present

## 2017-12-21 DIAGNOSIS — Z833 Family history of diabetes mellitus: Secondary | ICD-10-CM | POA: Diagnosis not present

## 2017-12-22 ENCOUNTER — Ambulatory Visit
Admission: RE | Admit: 2017-12-22 | Discharge: 2017-12-22 | Disposition: A | Discharge status: Home / Self Care | Payer: Medicaid Other | Source: Ambulatory Visit | Attending: Infectious Diseases | Admitting: Infectious Diseases

## 2017-12-22 DIAGNOSIS — B182 Chronic viral hepatitis C: Secondary | ICD-10-CM | POA: Diagnosis not present

## 2018-01-03 ENCOUNTER — Other Ambulatory Visit: Payer: Self-pay | Admitting: Infectious Diseases

## 2018-01-03 DIAGNOSIS — B20 Human immunodeficiency virus [HIV] disease: Secondary | ICD-10-CM

## 2018-01-04 ENCOUNTER — Encounter: Payer: Medicaid Other | Attending: Physical Medicine & Rehabilitation

## 2018-01-04 ENCOUNTER — Ambulatory Visit: Payer: Medicaid Other | Admitting: Physical Medicine & Rehabilitation

## 2018-01-04 DIAGNOSIS — M47816 Spondylosis without myelopathy or radiculopathy, lumbar region: Secondary | ICD-10-CM | POA: Insufficient documentation

## 2018-01-05 ENCOUNTER — Encounter: Payer: Self-pay | Admitting: Physical Medicine & Rehabilitation

## 2018-01-05 ENCOUNTER — Ambulatory Visit: Payer: Medicaid Other | Admitting: Physical Medicine & Rehabilitation

## 2018-01-05 ENCOUNTER — Other Ambulatory Visit: Payer: Self-pay

## 2018-01-05 VITALS — BP 150/75 | HR 78 | Resp 14 | Ht 61.0 in | Wt 177.6 lb

## 2018-01-05 DIAGNOSIS — M533 Sacrococcygeal disorders, not elsewhere classified: Secondary | ICD-10-CM

## 2018-01-05 DIAGNOSIS — G8929 Other chronic pain: Secondary | ICD-10-CM | POA: Diagnosis not present

## 2018-01-05 DIAGNOSIS — M47816 Spondylosis without myelopathy or radiculopathy, lumbar region: Secondary | ICD-10-CM | POA: Diagnosis not present

## 2018-01-05 NOTE — Progress Notes (Signed)
  [facility]   Name: [name] [name] DOB:[birth year] MRN: [id number]  Date:01/05/2018  Physician: Alysia Penna, MD    Nurse/CMA: Wessling CMA  Allergies:  Allergies  Allergen Reactions  . Acetaminophen Other (See Comments)    Inflamed liver, hospitalized REACTION: Had liver problems, was hospitalized Inflamed liver, hospitalized  . Morphine Hives and Rash    REACTION: rash  . Triamterene Hives  . Dyazide [Hydrochlorothiazide W-Triamterene] Hives  . Lisinopril     Cough   . Losartan Rash    Pt had rash, worsening dizziness, and nausea after starting losartan, which improved after stopping losartan    Consent Signed: No.  Is patient diabetic? No.  CBG today?   Pregnant: No. LMP: No LMP recorded. Patient is postmenopausal. (age 85-55)  Anticoagulants: no Anti-inflammatory: no Antibiotics: no  Procedure: Sacroiliac steroid injection Position: Prone Start Time: 11:55am End Time: 11:58am Fluoro Time: 12s   RN/CMA ShumakerRN Wessling CMA    Time 11:04 12:02pm    BP 150/75 138/91    Pulse 78 82    Respirations 14 14    O2 Sat 94 94    S/S 6 6    Pain Level 10 8     D/C home with [name], [name] & O X 3, D/C instructions reviewed, and sits independently.

## 2018-01-05 NOTE — Patient Instructions (Signed)
Sacroiliac injection was performed today. A combination of a naming medicine plus a cortisone medicine was injected. The injection was done under x-ray guidance. This procedure has been performed to help reduce low back and buttocks pain as well as potentially hip pain. The duration of this injection is variable lasting from hours to  Months. It may repeated if needed. 

## 2018-01-05 NOTE — Progress Notes (Signed)

## 2018-01-09 ENCOUNTER — Other Ambulatory Visit: Payer: Self-pay

## 2018-01-09 MED ORDER — RANITIDINE HCL 300 MG PO TABS: 300.0000 mg | ORAL_TABLET | Freq: Every day | ORAL | 5 refills | Status: DC

## 2018-01-09 MED ORDER — BUDESONIDE 180 MCG/ACT IN AEPB: 2.0000 | INHALATION_SPRAY | Freq: Every day | RESPIRATORY_TRACT | 5 refills | Status: DC

## 2018-01-11 ENCOUNTER — Other Ambulatory Visit (HOSPITAL_COMMUNITY)
Admission: RE | Admit: 2018-01-11 | Discharge: 2018-01-11 | Disposition: A | Discharge status: Home / Self Care | Payer: Medicaid Other | Source: Ambulatory Visit | Attending: Infectious Diseases | Admitting: Infectious Diseases

## 2018-01-11 ENCOUNTER — Ambulatory Visit (INDEPENDENT_AMBULATORY_CARE_PROVIDER_SITE_OTHER): Payer: Medicaid Other | Admitting: Infectious Diseases

## 2018-01-11 ENCOUNTER — Encounter: Payer: Self-pay | Admitting: Infectious Diseases

## 2018-01-11 VITALS — BP 135/89 | HR 75 | Temp 98.1°F | Ht 61.0 in | Wt 176.0 lb

## 2018-01-11 DIAGNOSIS — Z124 Encounter for screening for malignant neoplasm of cervix: Secondary | ICD-10-CM | POA: Diagnosis not present

## 2018-01-11 DIAGNOSIS — Z01419 Encounter for gynecological examination (general) (routine) without abnormal findings: Secondary | ICD-10-CM

## 2018-01-11 DIAGNOSIS — Z23 Encounter for immunization: Secondary | ICD-10-CM | POA: Diagnosis not present

## 2018-01-11 DIAGNOSIS — Z1151 Encounter for screening for human papillomavirus (HPV): Secondary | ICD-10-CM | POA: Diagnosis not present

## 2018-01-11 NOTE — Progress Notes (Signed)
      Subjective:    [name] [name] is a 57 y.o. female here for an annual pelvic exam and pap smear.   Review of Systems:  Current GYN complaints or concerns: none. Used to get yeast infections but not since stopping Jar[place].  Patient denies any abdominal/pelvic pain, problems with bowel movements, urination, vaginal discharge or intercourse.   Past Medical History:  Diagnosis Date  . Allergic rhinitis 05/09/2006  . Allergy   . Anxiety   . Arthritis   . Asthma   . CHF (congestive heart failure) ([place])   . Chronic back pain   . Diabetes mellitus without complication ([place])   . GERD (gastroesophageal reflux disease)   . Heart murmur    as a child  . 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 s/p 12 week course of simeprevir and sofosbuvir between October 2014 and January 2015 with resolution.  Marland Kitchen History of shingles   . HIV infection ([place])    1994  . Hyperlipidemia    no meds taken now  . Hypertension   . Pituitary microadenoma ([place]) 08/08/2014  . Prediabetes   . Refusal of blood transfusions as patient is Jehovah's Witness   . Secondary adrenal insufficiency ([place]) 06/29/2014  . Urticaria     Gynecologic History: No obstetric history on file.  No LMP recorded. Patient is postmenopausal. Contraception: post menopausal status Last Pap: 06/27/16. Results were: normal Anal Intercourse: no Last Mammogram: 08/31/17. Results were: normal  Objective:  Physical Exam  Constitutional: Well developed, well nourished, no acute distress. She is alert and oriented x3.  Pelvic: External genitalia is normal in appearance. The vagina is normal in appearance. The cervix is bulbous and easily visualized. No CMT, normal expected cervical mucus present. Bimanual exam reveals uterus that is felt to be normal size, shape, and contour. No adnexal masses or tenderness noted. Psych: She has a normal mood and affect.    Assessment:  Normal pelvic and bimanual exam.  Thin prep pap was obtained and sent for cytology with reflex HPV and GC/C today.   Plan:  Health Maintenance =   She has had 3 serial negative cytology/HPVs on last pap smears. Discussed recommendations to space out to q3years but to continue screening beyond the age of 57yo.   She has been counseled and instructed how to perform monthly self breast exams.  Screening mammogram up to date - next due 08/2018  Contraception / Family Planning =   Post-menopausal  HIV =   She will continue her Biktarvy and F/U as scheduled with Dr. Johnnye [name] for ongoing HIV care.   [name] Madeira, MSN, NP-C [facility] Office: [phone] Pager: [phone]  01/11/18 3:04 PM

## 2018-01-15 LAB — CYTOLOGY - PAP
Adequacy: ABSENT
Chlamydia: NEGATIVE
Diagnosis: NEGATIVE
HPV: NOT DETECTED
Neisseria Gonorrhea: NEGATIVE

## 2018-01-15 NOTE — Progress Notes (Signed)
Normal pap cytology. Repeat in 3 year. MyChart result sent.

## 2018-01-26 DIAGNOSIS — E119 Type 2 diabetes mellitus without complications: Secondary | ICD-10-CM | POA: Diagnosis not present

## 2018-01-26 DIAGNOSIS — H2513 Age-related nuclear cataract, bilateral: Secondary | ICD-10-CM | POA: Diagnosis not present

## 2018-01-26 DIAGNOSIS — H40023 Open angle with borderline findings, high risk, bilateral: Secondary | ICD-10-CM | POA: Diagnosis not present

## 2018-01-26 DIAGNOSIS — H04123 Dry eye syndrome of bilateral lacrimal glands: Secondary | ICD-10-CM | POA: Diagnosis not present

## 2018-01-26 LAB — HM DIABETES EYE EXAM

## 2018-01-30 ENCOUNTER — Encounter: Payer: Self-pay | Admitting: *Deleted

## 2018-01-30 DIAGNOSIS — E119 Type 2 diabetes mellitus without complications: Secondary | ICD-10-CM | POA: Diagnosis not present

## 2018-01-30 DIAGNOSIS — E2749 Other adrenocortical insufficiency: Secondary | ICD-10-CM | POA: Diagnosis not present

## 2018-01-30 DIAGNOSIS — Z5181 Encounter for therapeutic drug level monitoring: Secondary | ICD-10-CM | POA: Diagnosis not present

## 2018-01-30 DIAGNOSIS — D352 Benign neoplasm of pituitary gland: Secondary | ICD-10-CM | POA: Diagnosis not present

## 2018-01-30 DIAGNOSIS — Z833 Family history of diabetes mellitus: Secondary | ICD-10-CM | POA: Diagnosis not present

## 2018-03-12 ENCOUNTER — Encounter: Payer: Self-pay | Admitting: Physical Medicine & Rehabilitation

## 2018-03-12 ENCOUNTER — Ambulatory Visit: Payer: Medicaid Other | Admitting: Physical Medicine & Rehabilitation

## 2018-03-12 ENCOUNTER — Encounter: Payer: Medicaid Other | Attending: Physical Medicine & Rehabilitation

## 2018-03-12 VITALS — BP 135/83 | HR 86 | Ht 61.0 in | Wt 177.0 lb

## 2018-03-12 DIAGNOSIS — M533 Sacrococcygeal disorders, not elsewhere classified: Secondary | ICD-10-CM | POA: Diagnosis not present

## 2018-03-12 DIAGNOSIS — M47816 Spondylosis without myelopathy or radiculopathy, lumbar region: Secondary | ICD-10-CM | POA: Insufficient documentation

## 2018-03-12 NOTE — Patient Instructions (Signed)
Remember 3 sets of 10 repetitions for knee extension exercise and leg curl exercise on machine- you may refer to Youtube for further instruction

## 2018-03-12 NOTE — Progress Notes (Signed)
Subjective:    [name], female    DOB: 1961-01-03, 57 y.o.   MRN: [id number]  HPI 01/05/18  Right sacroiliac injection helped with pain  Some aching left side of back radiating into Left buttock and back of thigh  Patient is not complaining of lumbar pain at the current time  Feels week in legs, the patient has not been exercising.  She feels this weakness when she is going up and down steps.  Patient has had no falls no progressive numbness. Pain Inventory Average Pain 6 Pain Right Now 9 My pain is sharp and aching  In the last 24 hours, has pain interfered with the following? General activity 8 Relation with others 2 Enjoyment of life 7 What TIME of day is your pain at its worst? evening Sleep (in general) Fair  Pain is worse with: walking, bending and standing Pain improves with: heat/ice and injections Relief from Meds: 0  Mobility use a cane ability to climb steps?  yes do you drive?  no  Function Do you have any goals in this area?  no  Neuro/Psych weakness numbness trouble walking dizziness anxiety  Prior Studies Any changes since last visit?  no  Physicians involved in your care Any changes since last visit?  no   Family History  Problem Relation Age of Onset  . Heart disease Mother   . Diabetes Mother   . Stroke Mother   . Heart disease Father   . Stroke Father   . Diabetes Father   . Hepatitis Sister        hcv  . Asthma Sister   . Allergic rhinitis Sister   . Stroke Other   . Colon polyps Brother   . Renal Disease Brother   . Cancer Sister        lung  . Asthma Sister   . Allergic rhinitis Sister   . Cancer Maternal Aunt   . Cancer Maternal Aunt   . Colon cancer Neg Hx   . Esophageal cancer Neg Hx   . Stomach cancer Neg Hx   . Rectal cancer Neg Hx   . Angioedema Neg Hx   . Eczema Neg Hx   . Urticaria Neg Hx    Social History   Socioeconomic History  . Marital status: Single    Spouse name: Not on file  .  Number of children: 0  . Years of education: Not on file  . Highest education level: Not on file  Occupational History  . Not on file  Social Needs  . Financial resource strain: Not on file  . Food insecurity:    Worry: Not on file    Inability: Not on file  . Transportation needs:    Medical: Not on file    Non-medical: Not on file  Tobacco Use  . Smoking status: Former Smoker    Years: 20.00    Types: Cigarettes    Last attempt to quit: 04/26/2007    Years since quitting: 10.8  . Smokeless tobacco: Never Used  . Tobacco comment: QUIT 2009  Substance and Sexual Activity  . Alcohol use: No    Alcohol/week: 0.0 standard drinks  . Drug use: No  . Sexual activity: Not Currently    Partners: Male    Birth control/protection: Condom    Comment: pt. given condoms  Lifestyle  . Physical activity:    Days per week: Not on file    Minutes per session: Not on  file  . Stress: Not on file  Relationships  . Social connections:    Talks on phone: Not on file    Gets together: Not on file    Attends religious service: Not on file    Active member of club or organization: Not on file    Attends meetings of clubs or organizations: Not on file    Relationship status: Not on file  Other Topics Concern  . Not on file  Social History Narrative   Alternate # [phone]   Past Surgical History:  Procedure Laterality Date  . BUNIONECTOMY     b/l  . COLECTOMY     2003 for diverticulitis, had colostomy bag and then reversed  . COLONOSCOPY    . HAND SURGERY    . NASAL SINUS SURGERY    . SHOULDER SURGERY     left  . TONSILLECTOMY     Past Medical History:  Diagnosis Date  . Allergic rhinitis 05/09/2006  . Allergy   . Anxiety   . Arthritis   . Asthma   . CHF (congestive heart failure) ([place])   . Chronic back pain   . Diabetes mellitus without complication ([place])   . GERD (gastroesophageal reflux disease)   . Heart murmur    as a child  . Hepatitis C    genotype 1b, stage 2  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 s/p 12 week course of simeprevir and sofosbuvir between October 2014 and January 2015 with resolution.  Marland Kitchen History of shingles   . HIV infection ([place])    1994  . Hyperlipidemia    no meds taken now  . Hypertension   . Pituitary microadenoma ([place]) 08/08/2014  . Prediabetes   . Refusal of blood transfusions as patient is Jehovah's Witness   . Secondary adrenal insufficiency ([place]) 06/29/2014  . Urticaria    BP 135/83   Pulse 86   Ht 5\' 1"  (1.549 m)   Wt 177 lb (80.3 kg)   SpO2 95%   BMI 33.44 kg/m    Opioid Risk Score:   Fall Risk Score:  `1  Depression screen PHQ 2/9  Depression screen [facility] 2/9 01/11/2018 01/05/2018 12/04/2017 11/28/2017 09/19/2017 07/21/2017 05/18/2017  Decreased Interest 0 0 0 0 0 0 0  Down, Depressed, Hopeless 0 0 0 0 0 0 0  PHQ - 2 Score 0 0 0 0 0 0 0  Altered sleeping - - - - - - -  Tired, decreased energy - - - - - - -  Change in appetite - - - - - - -  Feeling bad or failure about yourself  - - - - - - -  Trouble concentrating - - - - - - -  Moving slowly or fidgety/restless - - - - - - -  Suicidal thoughts - - - - - - -  PHQ-9 Score - - - - - - -  Some recent data might be hidden     Review of Systems  Constitutional: Positive for appetite change and diaphoresis.  HENT: Negative.   Eyes: Negative.   Respiratory: Positive for cough.   Cardiovascular: Negative.   Gastrointestinal: Positive for constipation, nausea and vomiting.  Endocrine: Negative.   Genitourinary: Negative.   Musculoskeletal: Positive for arthralgias, back pain, gait problem and myalgias.  Skin: Negative.   Allergic/Immunologic: Negative.   Neurological: Positive for dizziness, weakness and numbness.  Hematological: Negative.   Psychiatric/Behavioral: The patient is nervous/anxious.   All other systems reviewed  and are negative.      Objective:   Physical Exam Alert and oriented x3 Mood and affect are appropriate General no acute  distress Nursing notes reviewed Tenderness palpation left PSIS area no tenderness over the right PSIS no tenderness in the lumbar paraspinal area Her lumbar range of motion reduced to 50% flexion extension lateral bending and rotation. Gait is without evidence of toe drag or knee instability Lower extremity strength is 5/5 bilateral hip flexor knee extensor ankle dorsiflex.       Assessment & Plan:  1.  History of lumbar spondylosis with excellent improvements after lumbar radiofrequency neurotomy last performed in December 2018 no signs of recurrent lumbar pain. 2.  Right sacroiliac dysfunction she has had good results that are ongoing 2 months after her sacroiliac injection performed in September 2019 3.  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

## 2018-03-13 ENCOUNTER — Other Ambulatory Visit: Payer: Self-pay | Admitting: Internal Medicine

## 2018-03-13 NOTE — Telephone Encounter (Signed)
Next appt scheduled 06/28/18 with PCP. 

## 2018-03-13 NOTE — Telephone Encounter (Signed)
Needs refill on Vitamin D, Cholecalciferol, 1000 units CAPS pantoprazole (PROTONIX) 40 MG tablet at [facility]   ; pt contact [phone]

## 2018-03-14 MED ORDER — PANTOPRAZOLE SODIUM 40 MG PO TBEC: 40.0000 mg | DELAYED_RELEASE_TABLET | Freq: Every day | ORAL | 2 refills | Status: DC

## 2018-03-14 MED ORDER — VITAMIN D (CHOLECALCIFEROL) 25 MCG (1000 UT) PO CAPS: 1000.0000 mg | ORAL_CAPSULE | Freq: Every day | ORAL | 2 refills | Status: DC

## 2018-03-21 ENCOUNTER — Other Ambulatory Visit: Payer: Self-pay | Admitting: *Deleted

## 2018-03-21 MED ORDER — PROAIR HFA 108 (90 BASE) MCG/ACT IN AERS: INHALATION_SPRAY | RESPIRATORY_TRACT | 1 refills | Status: DC

## 2018-04-02 ENCOUNTER — Ambulatory Visit: Payer: Medicaid Other | Admitting: Physical Medicine & Rehabilitation

## 2018-04-02 ENCOUNTER — Encounter: Payer: Self-pay | Admitting: Physical Medicine & Rehabilitation

## 2018-04-02 ENCOUNTER — Encounter: Payer: Medicaid Other | Attending: Physical Medicine & Rehabilitation

## 2018-04-02 VITALS — BP 123/78 | HR 86 | Ht 61.0 in | Wt 176.0 lb

## 2018-04-02 DIAGNOSIS — M47816 Spondylosis without myelopathy or radiculopathy, lumbar region: Secondary | ICD-10-CM | POA: Insufficient documentation

## 2018-04-02 DIAGNOSIS — M533 Sacrococcygeal disorders, not elsewhere classified: Secondary | ICD-10-CM | POA: Diagnosis not present

## 2018-04-02 NOTE — Patient Instructions (Signed)
Sacroiliac injection was performed today. A combination of a naming medicine plus a cortisone medicine was injected. The injection was done under x-ray guidance. This procedure has been performed to help reduce low back and buttocks pain as well as potentially hip pain. The duration of this injection is variable lasting from hours to  Months. It may repeated if needed. 

## 2018-04-02 NOTE — Progress Notes (Signed)
  PROCEDURE RECORD [facility]   Name: [name] [name] DOB:[birth year] MRN: [id number]  Date:04/02/2018  Physician: Alysia Penna, MD    Nurse/CMA: Tatem Holsonback CMA  Allergies:  Allergies  Allergen Reactions  . Acetaminophen Other (See Comments)    Inflamed liver, hospitalized REACTION: Had liver problems, was hospitalized Inflamed liver, hospitalized  . Morphine Hives and Rash    REACTION: rash  . Triamterene Hives  . Dyazide [Hydrochlorothiazide W-Triamterene] Hives  . Lisinopril     Cough   . Losartan Rash    Pt had rash, worsening dizziness, and nausea after starting losartan, which improved after stopping losartan    Consent Signed: Yes.    Is patient diabetic? Yes.    CBG today? 119  Pregnant: No. LMP: No LMP recorded. Patient is postmenopausal. (age 35-55)  Anticoagulants: no Anti-inflammatory: no Antibiotics: no  Procedure: left Sacroiliac injection Position: Prone   Start Time: 137pm End Time: 140pm Fluoro Time: 12s  RN/CMA Lashaun Krapf CMA Ginamarie Banfield CMA    Time 106pm 145pm    BP 123/78 147/90    Pulse 86 81    Respirations 16 16    O2 Sat 94 95    S/S 6 6    Pain Level 10/10 7/10     D/C home with Brother, [name] & O X 3, D/C instructions reviewed, and sits independently.

## 2018-04-02 NOTE — Progress Notes (Signed)
Left sacroiliac injection under fluoroscopic guidance  Indication: Left Low back and buttocks pain not relieved by medication management and other conservative care.  Informed consent was obtained after describing risks and benefits of the procedure with the patient, this includes bleeding, bruising, infection, paralysis and medication side effects. The patient wishes to proceed and has given written consent. The patient was placed in a prone position. The lumbar and sacral area was marked and prepped with Betadine. A 25-gauge 1-1/2 inch needle was inserted into the skin and subcutaneous tissue and 1 mL of 1% lidocaine was injected. Then a 25-gauge 3 inch spinal needle was inserted under fluoroscopic guidance into the left sacroiliac joint. AP and lateral images were utilized. Isovue 200x0.5 mL under live fluoroscopy demonstrated no intravascular uptake. Then a solution containing one ML of 6 mg per mLbetamethasone and 2 ML of 2% lidocaine MPF was injected x1.5 mL. Patient tolerated the procedure well. Post procedure instructions were given. Please see post procedure form. 

## 2018-04-12 ENCOUNTER — Other Ambulatory Visit: Payer: Self-pay | Admitting: Internal Medicine

## 2018-05-08 ENCOUNTER — Telehealth: Payer: Self-pay

## 2018-05-08 ENCOUNTER — Encounter: Payer: Self-pay | Admitting: Allergy and Immunology

## 2018-05-08 ENCOUNTER — Ambulatory Visit: Payer: Medicaid Other | Admitting: Allergy and Immunology

## 2018-05-08 VITALS — BP 130/70 | HR 86 | Resp 18 | Ht 61.0 in | Wt 174.0 lb

## 2018-05-08 DIAGNOSIS — J3089 Other allergic rhinitis: Secondary | ICD-10-CM | POA: Diagnosis not present

## 2018-05-08 DIAGNOSIS — G43909 Migraine, unspecified, not intractable, without status migrainosus: Secondary | ICD-10-CM | POA: Diagnosis not present

## 2018-05-08 DIAGNOSIS — K219 Gastro-esophageal reflux disease without esophagitis: Secondary | ICD-10-CM | POA: Diagnosis not present

## 2018-05-08 DIAGNOSIS — J4530 Mild persistent asthma, uncomplicated: Secondary | ICD-10-CM | POA: Diagnosis not present

## 2018-05-08 DIAGNOSIS — L299 Pruritus, unspecified: Secondary | ICD-10-CM | POA: Diagnosis not present

## 2018-05-08 NOTE — Telephone Encounter (Signed)
Dr. Neldon Mc would like for patient to be seen by headache center or neurologist.

## 2018-05-08 NOTE — Progress Notes (Signed)
Cardiology Office Note:    Date:  05/11/2018   ID:  [name] [name], DOB 1960/10/01, MRN [id number]  PCP:  [name], DO  Cardiologist:  Jenkins Rouge, MD  Referring MD: Kalman Shan Ratlif*   Chest Pain Dyspnea and Palpitations   History of Present Illness:    58 y.o. obese cushingoid black female. History of Rx Hepatitis C, HIV followed by ID Dr Johnnye Sima. 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 History of GERD, CKD and pancreatitis.  Jehovah's witness no blood products. History of atypical chest pain with normal stress echo in 2016 and normal Lexiscan Myovue in 2018 GERD with difficulty laying flat due to reflux.  She continues to smoke   Pain is chest atypical Also with some exertional dyspnea and palpitations    Past Medical History:  Diagnosis Date  . Allergic rhinitis 05/09/2006  . Allergy   . Anxiety   . Arthritis   . Asthma   . CHF (congestive heart failure) ([place])   . Chronic back pain   . Diabetes mellitus without complication ([place])   . GERD (gastroesophageal reflux disease)   . Heart murmur    as a child  . 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 s/p 12 week course of simeprevir and sofosbuvir between October 2014 and January 2015 with resolution.  Marland Kitchen History of shingles   . HIV infection ([place])    1994  . Hyperlipidemia    no meds taken now  . Hypertension   . Pituitary microadenoma ([place]) 08/08/2014  . Prediabetes   . Refusal of blood transfusions as patient is Jehovah's Witness   . Secondary adrenal insufficiency ([place]) 06/29/2014  . Urticaria     Past Surgical History:  Procedure Laterality Date  . BUNIONECTOMY     b/l  . COLECTOMY     2003 for diverticulitis, had colostomy bag and then reversed  . COLONOSCOPY    . HAND SURGERY    . NASAL SINUS SURGERY    . SHOULDER SURGERY     left  . TONSILLECTOMY      Current Medications: Current Meds  Medication Sig  . amLODipine (NORVASC) 10 MG tablet  Take 1 tablet (10 mg total) by mouth daily.  Marland Kitchen atorvastatin (LIPITOR) 10 MG tablet Take 1 tablet (10 mg total) by mouth daily. (Patient taking differently: Take 5 mg by mouth daily. )  . BIKTARVY 50-200-25 MG TABS tablet TAKE 1 TABLET BY MOUTH DAILY.  . budesonide (PULMICORT FLEXHALER) 180 MCG/ACT inhaler Inhale 2 puffs into the lungs daily. Rinse, gargle, and spit after use.  . cetirizine (ZYRTEC) 10 MG tablet TAKE 1 TABLET BY MOUTH EVERY DAY AS NEEDED FOR ALLERGIES  . diazepam (VALIUM) 5 MG tablet TAKE ONE TABLET BY MOUTH AT BEDTIME AS NEEDED FOR ANXIETY  . diphenhydrAMINE (DIPHENHIST) 25 MG tablet TAKE 1 tablet AS NEEDED FOR ITCHING.  . hydrocortisone (CORTEF) 10 MG tablet Take 10 mg by mouth as directed. Pt takes 10 mg in the am and 5 mg at 11 am and 3 pm  . liraglutide (VICTOZA) 18 MG/3ML SOPN Inject into the skin.  . metoprolol tartrate (LOPRESSOR) 25 MG tablet Take 1 tablet (25 mg total) by mouth 2 (two) times daily.  Marland Kitchen neomycin-polymyxin-hydrocortisone (CORTISPORIN) 3.5-10000-1 OTIC suspension PLACE THREE DROPS INTO THE LEFT  EAR USE 4 TIMES DAILY FOR 10 DAYS.  Marland Kitchen nitroGLYCERIN (NITROSTAT) 0.4 MG SL tablet Place 1 tablet (0.4 mg total) under the tongue  every 5 (five) minutes as needed for chest pain.  . pantoprazole (PROTONIX) 40 MG tablet Take 1 tablet (40 mg total) by mouth daily.  . promethazine (PHENERGAN) 25 MG tablet Take 1 tablet (25 mg total) by mouth every 6 (six) hours as needed for nausea or vomiting.  . ranitidine (ZANTAC) 300 MG tablet Take 1 tablet (300 mg total) by mouth at bedtime.  . Vitamin D, Cholecalciferol, 25 MCG (1000 UT) CAPS Take 1,000 mg by mouth daily.  . [DISCONTINUED] montelukast (SINGULAIR) 10 MG tablet Take 1 tablet (10 mg total) by mouth daily.  . [DISCONTINUED] PROAIR HFA 108 (90 Base) MCG/ACT inhaler Inhale two puffs every four to six hours as needed for cough or wheeze.  . [DISCONTINUED] sodium chloride (OCEAN) 0.65 % SOLN nasal spray Place 1 spray into  both nostrils as needed for congestion.     Allergies:   Acetaminophen; Morphine; Triamterene; Dyazide [hydrochlorothiazide w-triamterene]; Lisinopril; and Losartan   Social History   Socioeconomic History  . Marital status: Single    Spouse name: Not on file  . Number of children: 0  . Years of education: Not on file  . Highest education level: Not on file  Occupational History  . Not on file  Social Needs  . Financial resource strain: Not on file  . Food insecurity:    Worry: Not on file    Inability: Not on file  . Transportation needs:    Medical: Not on file    Non-medical: Not on file  Tobacco Use  . Smoking status: Former Smoker    Years: 20.00    Types: Cigarettes    Last attempt to quit: 04/26/2007    Years since quitting: 11.0  . Smokeless tobacco: Never Used  . Tobacco comment: QUIT 2009  Substance and Sexual Activity  . Alcohol use: No    Alcohol/week: 0.0 standard drinks  . Drug use: No  . Sexual activity: Not Currently    Partners: Male    Birth control/protection: Condom    Comment: pt. given condoms  Lifestyle  . Physical activity:    Days per week: Not on file    Minutes per session: Not on file  . Stress: Not on file  Relationships  . Social connections:    Talks on phone: Not on file    Gets together: Not on file    Attends religious service: Not on file    Active member of club or organization: Not on file    Attends meetings of clubs or organizations: Not on file    Relationship status: Not on file  Other Topics Concern  . Not on file  Social History Narrative   Alternate # [phone]     Family History: The patient's family history includes Allergic rhinitis in her sister and sister; Asthma in her sister and sister; Cancer in her maternal aunt, maternal aunt, and sister; Colon polyps in her brother; Diabetes in her father and mother; Heart disease in her father and mother; Hepatitis in her sister; Renal Disease in her brother; Stroke in  her father, mother, and another family member. There is no history of Colon cancer, Esophageal cancer, Stomach cancer, Rectal cancer, Angioedema, Eczema, or Urticaria. ROS:   Please see the history of present illness.     All other systems reviewed and are negative.  EKGs/Labs/Other Studies Reviewed:    The following studies were reviewed today:  Lexiscan Myoview 10/10/2016  Nuclear stress EF: 63%.  There was no ST segment  deviation noted during stress.  The study is normal.  The left ventricular ejection fraction is normal (55-65%).   1. EF 63%, normal wall motion.  2. No evidence for ischemia or infarction on perfusion images.   Normal study.   Stress Echo 10/21/14 Study Conclusions - Stress ECG conclusions: The stress ECG was normal. - Baseline: LV global systolic function was normal. The estimated   LV ejection fraction was 60%. Normal wall motion; no LV regional   wall motion abnormalities. - Peak stress: LV global systolic function was vigorous. Normal   wall motion; no LV regional wall motion abnormalities. - Impressions: Normal increase in thickening and contractility in   all segments. This is a normal stress echo study.  Impressions: - Normal increase in thickening and contractility in all segments.   This is a normal stress echo study.    EKG:  09/04/17  NSR with non-specific T wave abnormalities, similar to previous tracings. 83 bpm.   Recent Labs: 11/16/2017: ALT 19; BUN 8; Creat 0.76; Hemoglobin 14.1; Platelets 258; Potassium 3.5; Sodium 137   Recent Lipid Panel    Component Value Date/Time   CHOL 149 11/16/2017 1437   TRIG 308 (H) 11/16/2017 1437   HDL 36 (L) 11/16/2017 1437   CHOLHDL 4.1 11/16/2017 1437   VLDL 44 (H) 07/12/2016 1137   LDLCALC 76 11/16/2017 1437    Physical Exam:    VS:  BP 130/80   Pulse 85   Ht 5\' 1"  (1.549 m)   Wt 175 lb (79.4 kg)   SpO2 94%   BMI 33.07 kg/m     Wt Readings from Last 3 Encounters:  05/10/18 175 lb  (79.4 kg)  05/08/18 174 lb (78.9 kg)  04/02/18 176 lb (79.8 kg)    Affect appropriate Obese Cushingoid Black female  HEENT: normal Neck supple with no adenopathy JVP normal no bruits no thyromegaly Lungs clear with no wheezing and good diaphragmatic motion Heart:  S1/S2 no murmur, no rub, gallop or click PMI normal Abdomen: benighn, BS positve, no tenderness, no AAA no bruit.  No HSM or HJR Distal pulses intact with no bruits No edema Neuro non-focal Skin warm and dry No muscular weakness    ASSESSMENT:    1. Essential hypertension   2. Chest pressure   3. Chest pain, unspecified type    PLAN:    In order of problems listed above:  Chest discomfort: Atypical likely GI. Normal stress echo June 2016 and normal Leane Call June 2018  Patient has SL nitro if needed Given DM will repeat myovue   Addison's disease: continue steroids f/u cortisol levels with endocrinology   Hypertension: Well controlled.  Continue current medications and low sodium Dash type diet.    GERD: Continue pantoprazole in the morning and ranitidine at night. Take steroids with meal   Diabetes, steroid induced: Discussed low carb diet.  Target hemoglobin A1c is 6.5 or less.  Continue current medications.  HIV:  Followed by Dr Johnnye Sima, on biktarvy started HIV 1 RNA quant ordered by ID   Hepatitis C:  Rx   If myovue is normal will observe If dyspnea and palpitations persist despite normal myouve will Consider TTE and event monitor   Jenkins Rouge

## 2018-05-08 NOTE — Patient Instructions (Addendum)
  1.  Continue to perform allergen avoidance measures - house dust mite  2.  Continue to Treat and prevent inflammation:   A.  Montelukast 10 mg - 1 tablet 1 time per day  B.  Pulmicort 180 -2 inhalations 1 time per day    C.  OTC Nasacort -1 spray each nostril 1 time per day  3.  Continue to Treat and prevent reflux:   A.  Protonix 40 mg in a.m.  B.  ranitidine 300 mg in p.m.  4.  Can use cetirizine 10 mg -1-2 tablets 1-2 times a day (maximum 40mg )  5.  If needed:   A.  OTC Benadryl  B.  OTC nasal saline  C.  Pro Air HFA 2 puffs every 4-6 hours  6.  Obtain evaluation at headache center / neurologist  7. Revisit with Dr. Erik Obey about hearing loss  8. Hydration of skin with bath / shower followed by Vaseline while wet.  9. Return to clinic in 12 weeks or earlier if problem

## 2018-05-08 NOTE — Progress Notes (Signed)
Follow-up Note  Referring Provider: Kalman Shan Ratlif* Primary Provider: [name], DO Date of Office Visit: 05/08/2018  Subjective:   [name] [name] (DOB: 04/16/1961) is a 58 y.o. female who returns to the [facility] on 05/08/2018 in re-evaluation of the following:  HPI: Kaneshia returns to this clinic in reevaluation of her asthma and allergic rhinitis and pruritus and headache and reflux.  Her last visit to this clinic was 12 December 2017 at which point in time she was doing well regarding each 1 of these issues.  Her breathing still is doing relatively well.  She only uses a bronchodilator around the time of exertion which averages out about 3 times per week.  She does not have any nocturnal bronchospastic symptoms.  It does not sound as though she has required a systemic steroid to treat an exacerbation of her asthma.  She continues to use Pulmicort consistently.  She has not had that much problems with her nose.  It sometimes feels "dry".  She does continue to use a nasal steroid about 3 times per week.  She occasionally uses nasal saline as well.  She does state that when she wakes up in the morning she does have an issue with feeling as though there is a coating in her throat.  She has to hack up a thick layer of mucus.  She thinks that her reflux has been under pretty good control at this point in time while using a combination of a proton pump inhibitor and an H2 receptor blocker.  She has had return of her pruritus recently even while using cetirizine 10 mg daily without any associated systemic or constitutional symptoms.  Her headaches have returned as well.  When I last saw her in this clinic she was not having headaches but they are now occurring on a daily basis for at least the last 2 to 3 months without any other associated neurological symptoms.  Her hearing has diminished dramatically recently.  When I last saw her in this clinic she did  have a tube removed from her right ear by Dr. Erik Obey.  She does not have any associated vertigo or tinnitus.  Allergies as of 05/08/2018      Reactions   Acetaminophen Other (See Comments)   Inflamed liver, hospitalized REACTION: Had liver problems, was hospitalized Inflamed liver, hospitalized   Morphine Hives, Rash   REACTION: rash   Triamterene Hives   Dyazide [hydrochlorothiazide W-triamterene] Hives   Lisinopril    Cough    Losartan Rash   Pt had rash, worsening dizziness, and nausea after starting losartan, which improved after stopping losartan      Medication List      amLODipine 10 MG tablet Commonly known as:  NORVASC Take 1 tablet (10 mg total) by mouth daily.   atorvastatin 10 MG tablet Commonly known as:  LIPITOR Take 1 tablet (10 mg total) by mouth daily.   BIKTARVY 50-200-25 MG Tabs tablet Generic drug:  bictegravir-emtricitabine-tenofovir AF TAKE 1 TABLET BY MOUTH DAILY.   budesonide 180 MCG/ACT inhaler Commonly known as:  PULMICORT FLEXHALER Inhale 2 puffs into the lungs daily. Rinse, gargle, and spit after use.   cetirizine 10 MG tablet Commonly known as:  ZYRTEC TAKE 1 TABLET BY MOUTH EVERY DAY AS NEEDED FOR ALLERGIES   diazepam 5 MG tablet Commonly known as:  VALIUM TAKE ONE TABLET BY MOUTH AT BEDTIME AS NEEDED FOR ANXIETY   diphenhydrAMINE 25 MG tablet Commonly  known as:  DIPHENHIST TAKE 1 tablet AS NEEDED FOR ITCHING.   hydrocortisone 10 MG tablet Commonly known as:  CORTEF Take 10 mg by mouth as directed. Pt takes 10 mg in the am and 5 mg at 11 am and 3 pm   liraglutide 18 MG/3ML Sopn Commonly known as:  VICTOZA Inject into the skin.   metoprolol tartrate 25 MG tablet Commonly known as:  LOPRESSOR Take 1 tablet (25 mg total) by mouth 2 (two) times daily.   montelukast 10 MG tablet Commonly known as:  SINGULAIR Take 1 tablet (10 mg total) by mouth daily.   neomycin-polymyxin-hydrocortisone 3.5-10000-1 OTIC suspension Commonly  known as:  CORTISPORIN PLACE THREE DROPS INTO THE LEFT  EAR USE 4 TIMES DAILY FOR 10 DAYS.   nitroGLYCERIN 0.4 MG SL tablet Commonly known as:  NITROSTAT Place 1 tablet (0.4 mg total) under the tongue every 5 (five) minutes as needed for chest pain.   pantoprazole 40 MG tablet Commonly known as:  PROTONIX Take 1 tablet (40 mg total) by mouth daily.   PROAIR HFA 108 (90 Base) MCG/ACT inhaler Generic drug:  albuterol Inhale two puffs every four to six hours as needed for cough or wheeze.   promethazine 25 MG tablet Commonly known as:  PHENERGAN Take 1 tablet (25 mg total) by mouth every 6 (six) hours as needed for nausea or vomiting.   ranitidine 300 MG tablet Commonly known as:  ZANTAC Take 1 tablet (300 mg total) by mouth at bedtime.   sodium chloride 0.65 % Soln nasal spray Commonly known as:  OCEAN Place 1 spray into both nostrils as needed for congestion.   Vitamin D (Cholecalciferol) 25 MCG (1000 UT) Caps Take 1,000 mg by mouth daily.       Past Medical History:  Diagnosis Date  . Allergic rhinitis 05/09/2006  . Allergy   . Anxiety   . Arthritis   . Asthma   . CHF (congestive heart failure) ([place])   . Chronic back pain   . Diabetes mellitus without complication ([place])   . GERD (gastroesophageal reflux disease)   . Heart murmur    as a child  . 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 s/p 12 week course of simeprevir and sofosbuvir between October 2014 and January 2015 with resolution.  Marland Kitchen History of shingles   . HIV infection ([place])    1994  . Hyperlipidemia    no meds taken now  . Hypertension   . Pituitary microadenoma ([place]) 08/08/2014  . Prediabetes   . Refusal of blood transfusions as patient is Jehovah's Witness   . Secondary adrenal insufficiency ([place]) 06/29/2014  . Urticaria     Past Surgical History:  Procedure Laterality Date  . BUNIONECTOMY     b/l  . COLECTOMY     2003 for diverticulitis, had colostomy bag and  then reversed  . COLONOSCOPY    . HAND SURGERY    . NASAL SINUS SURGERY    . SHOULDER SURGERY     left  . TONSILLECTOMY      Review of systems negative except as noted in HPI / PMHx or noted below:  Review of Systems  Constitutional: Negative.   HENT: Negative.   Eyes: Negative.   Respiratory: Negative.   Cardiovascular: Negative.   Gastrointestinal: Negative.   Genitourinary: Negative.   Musculoskeletal: Negative.   Skin: Negative.   Neurological: Negative.   Endo/Heme/Allergies: Negative.   Psychiatric/Behavioral: Negative.  Objective:   Vitals:   05/08/18 1509  BP: 130/70  Pulse: 86  Resp: 18  SpO2: 95%   Height: 5\' 1"  (154.9 cm)  Weight: 174 lb (78.9 kg)   Physical Exam Constitutional:      Appearance: She is not diaphoretic.  HENT:     Head: Normocephalic.     Right Ear: Ear canal and external ear normal. Tympanic membrane is perforated.     Left Ear: Ear canal and external ear normal. Tympanic membrane is perforated.     Nose: Nose normal. No mucosal edema or rhinorrhea.     Mouth/Throat:     Pharynx: Uvula midline. No oropharyngeal exudate.  Eyes:     Conjunctiva/sclera: Conjunctivae normal.  Neck:     Thyroid: No thyromegaly.     Trachea: Trachea normal. No tracheal tenderness or tracheal deviation.  Cardiovascular:     Rate and Rhythm: Normal rate and regular rhythm.     Heart sounds: Normal heart sounds, S1 normal and S2 normal. No murmur.  Pulmonary:     Effort: No respiratory distress.     Breath sounds: Normal breath sounds. No stridor. No wheezing or rales.  Lymphadenopathy:     Head:     Right side of head: No tonsillar adenopathy.     Left side of head: No tonsillar adenopathy.     Cervical: No cervical adenopathy.  Skin:    Findings: No erythema or rash.     Nails: There is no clubbing.   Neurological:     Mental Status: She is alert.     Diagnostics:    Spirometry was performed and demonstrated an FEV1 of 1.51 at 75 %  of predicted.  The patient had an Asthma Control Test with the following results: ACT Total Score: 16.    Assessment and Plan:   1. Asthma, well controlled, mild persistent   2. Other allergic rhinitis   3. Gastroesophageal reflux disease, esophagitis presence not specified   4. Pruritic disorder   5. Migraine syndrome     1.  Continue to perform allergen avoidance measures - house dust mite  2.  Continue to Treat and prevent inflammation:   A.  Montelukast 10 mg - 1 tablet 1 time per day  B.  Pulmicort 180 -2 inhalations 1 time per day    C.  OTC Nasacort -1 spray each nostril 1 time per day  3.  Continue to Treat and prevent reflux:   A.  Protonix 40 mg in a.m.  B.  ranitidine 300 mg in p.m.  4.  Can use cetirizine 10 mg -1-2 tablets 1-2 times a day (maximum 40mg )  5.  If needed:   A.  OTC Benadryl  B.  OTC nasal saline  C.  Pro Air HFA 2 puffs every 4-6 hours  6.  Obtain evaluation at headache center / neurologist  7. Revisit with Dr. Erik Obey about hearing loss  8. Hydration of skin with bath / shower followed by Vaseline while wet.  9. Return to clinic in 12 weeks or earlier if problem  [place] will continue on anti-inflammatory agents for her airway but I have asked her to be a little more consistent about using these agents on a daily basis.  We will get her to hydrate her skin with a bath or shower followed by Vaseline in an attempt to lock-in this moisture as I suspect that some of her pruritus is probably related to drying of her skin.  She obviously  has headaches that have not responded well to medical treatment and she should be evaluated at the headache center at this point.  She needs to visit with Dr. Erik Obey about her hearing loss and her bilateral perforated tympanic membranes.  We will see how things go over the course of the next 12 weeks utilizing this approach.  She will contact me with her interval should there be a problem.  Allena Katz, MD Allergy /  Immunology [place]

## 2018-05-09 ENCOUNTER — Encounter: Payer: Self-pay | Admitting: Allergy and Immunology

## 2018-05-10 ENCOUNTER — Ambulatory Visit: Payer: Medicaid Other | Admitting: Cardiovascular Disease

## 2018-05-10 ENCOUNTER — Other Ambulatory Visit: Payer: Self-pay | Admitting: Allergy and Immunology

## 2018-05-10 ENCOUNTER — Other Ambulatory Visit: Payer: Self-pay | Admitting: Internal Medicine

## 2018-05-10 ENCOUNTER — Encounter: Payer: Self-pay | Admitting: Cardiovascular Disease

## 2018-05-10 VITALS — BP 130/80 | HR 85 | Ht 61.0 in | Wt 175.0 lb

## 2018-05-10 DIAGNOSIS — R079 Chest pain, unspecified: Secondary | ICD-10-CM | POA: Diagnosis not present

## 2018-05-10 DIAGNOSIS — R0789 Other chest pain: Secondary | ICD-10-CM

## 2018-05-10 DIAGNOSIS — I10 Essential (primary) hypertension: Secondary | ICD-10-CM

## 2018-05-10 NOTE — Patient Instructions (Addendum)
Medication Instructions:   If you need a refill on your cardiac medications before your next appointment, please call your pharmacy.   Lab work:  If you have labs (blood work) drawn today and your tests are completely normal, you will receive your results only by: . MyChart Message (if you have MyChart) OR . A paper copy in the mail If you have any lab test that is abnormal or we need to change your treatment, we will call you to review the results.  Testing/Procedures: Your physician has requested that you have a lexiscan myoview. For further information please visit [email or web address]. Please follow instruction sheet, as given.  Follow-Up: At [facility], you and your health needs are our priority.  As part of our continuing mission to provide you with exceptional heart care, we have created designated Provider Care Teams.  These Care Teams include your primary Cardiologist (physician) and Advanced Practice Providers (APPs -  Physician Assistants and Nurse Practitioners) who all work together to provide you with the care you need, when you need it. You will need a follow up appointment in 6 months.  Please call our office 2 months in advance to schedule this appointment.  You may see Peter Nishan, MD or one of the following Advanced Practice Providers on your designated Care Team:   Lori Gerhardt, NP Laura Ingold, NP . Jill McDaniel, NP     

## 2018-05-10 NOTE — Telephone Encounter (Signed)
Referral placed to [facility]rology.

## 2018-05-14 DIAGNOSIS — E119 Type 2 diabetes mellitus without complications: Secondary | ICD-10-CM | POA: Diagnosis not present

## 2018-05-14 DIAGNOSIS — D352 Benign neoplasm of pituitary gland: Secondary | ICD-10-CM | POA: Diagnosis not present

## 2018-05-14 DIAGNOSIS — Z5181 Encounter for therapeutic drug level monitoring: Secondary | ICD-10-CM | POA: Diagnosis not present

## 2018-05-14 DIAGNOSIS — E2749 Other adrenocortical insufficiency: Secondary | ICD-10-CM | POA: Diagnosis not present

## 2018-05-14 DIAGNOSIS — Z833 Family history of diabetes mellitus: Secondary | ICD-10-CM | POA: Diagnosis not present

## 2018-05-16 ENCOUNTER — Telehealth (HOSPITAL_COMMUNITY): Payer: Self-pay | Admitting: *Deleted

## 2018-05-16 NOTE — Telephone Encounter (Signed)
Patient given detailed instructions per Myocardial Perfusion Study Information Sheet for the test on 05/21/18. Patient notified to arrive 15 minutes early and that it is imperative to arrive on time for appointment to keep from having the test rescheduled.  If you need to cancel or reschedule your appointment, please call the office within 24 hours of your appointment. . Patient verbalized understanding. [name] [name], Terilyn Jacqueline    

## 2018-05-21 ENCOUNTER — Ambulatory Visit (HOSPITAL_COMMUNITY): Payer: Medicaid Other | Attending: Cardiovascular Disease

## 2018-05-21 VITALS — Ht 61.0 in | Wt 175.0 lb

## 2018-05-21 DIAGNOSIS — I10 Essential (primary) hypertension: Secondary | ICD-10-CM | POA: Insufficient documentation

## 2018-05-21 DIAGNOSIS — R0789 Other chest pain: Secondary | ICD-10-CM | POA: Diagnosis not present

## 2018-05-21 DIAGNOSIS — R079 Chest pain, unspecified: Secondary | ICD-10-CM | POA: Insufficient documentation

## 2018-05-21 DIAGNOSIS — R110 Nausea: Secondary | ICD-10-CM | POA: Diagnosis present

## 2018-05-21 MED ORDER — AMINOPHYLLINE 25 MG/ML IV SOLN
150.0000 mg | Freq: Once | INTRAVENOUS | Status: AC
  Administered 2018-05-21: 150 mg via INTRAVENOUS

## 2018-05-21 MED ORDER — REGADENOSON 0.4 MG/5ML IV SOLN
0.4000 mg | Freq: Once | INTRAVENOUS | Status: AC
  Administered 2018-05-21: 0.4 mg via INTRAVENOUS

## 2018-05-21 MED ORDER — TECHNETIUM TC 99M TETROFOSMIN IV KIT
32.8000 | PACK | Freq: Once | INTRAVENOUS | Status: AC | PRN
  Administered 2018-05-21: 32.8 via INTRAVENOUS
  Filled 2018-05-21: qty 33

## 2018-05-22 ENCOUNTER — Ambulatory Visit (HOSPITAL_COMMUNITY): Payer: Medicaid Other | Attending: Cardiovascular Disease

## 2018-05-22 LAB — MYOCARDIAL PERFUSION IMAGING
LV dias vol: 60 mL (ref 46–106)
LV sys vol: 26 mL
Peak HR: 115 {beats}/min
Rest HR: 96 {beats}/min
SDS: 4
SRS: 0
SSS: 4
TID: 0.94

## 2018-05-22 MED ORDER — TECHNETIUM TC 99M TETROFOSMIN IV KIT
30.6000 | PACK | Freq: Once | INTRAVENOUS | Status: AC | PRN
  Administered 2018-05-22: 30.6 via INTRAVENOUS
  Filled 2018-05-22: qty 31

## 2018-05-31 ENCOUNTER — Ambulatory Visit: Payer: Medicaid Other | Admitting: Physical Medicine & Rehabilitation

## 2018-06-07 ENCOUNTER — Encounter: Payer: Medicaid Other | Attending: Physical Medicine & Rehabilitation

## 2018-06-07 ENCOUNTER — Encounter: Payer: Self-pay | Admitting: Physical Medicine & Rehabilitation

## 2018-06-07 ENCOUNTER — Ambulatory Visit: Payer: Medicaid Other | Admitting: Physical Medicine & Rehabilitation

## 2018-06-07 VITALS — BP 125/80 | HR 79 | Ht 61.0 in | Wt 178.0 lb

## 2018-06-07 DIAGNOSIS — M461 Sacroiliitis, not elsewhere classified: Secondary | ICD-10-CM | POA: Diagnosis not present

## 2018-06-07 DIAGNOSIS — M47816 Spondylosis without myelopathy or radiculopathy, lumbar region: Secondary | ICD-10-CM | POA: Insufficient documentation

## 2018-06-07 NOTE — Patient Instructions (Signed)
Please start doing leg strengthening exercise

## 2018-06-07 NOTE — Progress Notes (Signed)
Subjective:    [name], female    DOB: Dec 03, 1960, 58 y.o.   MRN: [id number]  HPI 58 year old female with history of chronic low back and buttock pain as well as history of HIV.  The patient has noted some recurrence of right-sided buttock pain as well as sacral pain.  Her left-sided sacral pain has improved after the left sacroiliac injection performed 04/02/2018.  Previous right sacroiliac injection under fluoroscopic guidance was performed on 01/05/2018 and resulted in good relief of her right-sided sacral pain. The patient has had no falls or other new injuries.  She has not lost any weight but wishes to do so.  She is trying to do more exercise but states her pain has been limiting her from exercise.  Pain Inventory Average Pain 6 Pain Right Now 10 My pain is sharp, dull, stabbing and aching  In the last 24 hours, has pain interfered with the following? General activity 10 Relation with others 7 Enjoyment of life 10 What TIME of day is your pain at its worst? all Sleep (in general) Poor  Pain is worse with: walking, bending, sitting, inactivity and standing Pain improves with: injections Relief from Meds: 0  Mobility walk with assistance use a cane how many minutes can you walk? 15 Do you have any goals in this area?  yes  Function disabled: date disabled .  Neuro/Psych weakness dizziness anxiety  Prior Studies Any changes since last visit?  no  Physicians involved in your care Any changes since last visit?  no   Family History  Problem Relation Age of Onset  . Heart disease Mother   . Diabetes Mother   . Stroke Mother   . Heart disease Father   . Stroke Father   . Diabetes Father   . Hepatitis Sister        hcv  . Asthma Sister   . Allergic rhinitis Sister   . Stroke Other   . Colon polyps Brother   . Renal Disease Brother   . Cancer Sister        lung  . Asthma Sister   . Allergic rhinitis Sister   . Cancer Maternal Aunt   .  Cancer Maternal Aunt   . Colon cancer Neg Hx   . Esophageal cancer Neg Hx   . Stomach cancer Neg Hx   . Rectal cancer Neg Hx   . Angioedema Neg Hx   . Eczema Neg Hx   . Urticaria Neg Hx    Social History   Socioeconomic History  . Marital status: Single    Spouse name: Not on file  . Number of children: 0  . Years of education: Not on file  . Highest education level: Not on file  Occupational History  . Not on file  Social Needs  . Financial resource strain: Not on file  . Food insecurity:    Worry: Not on file    Inability: Not on file  . Transportation needs:    Medical: Not on file    Non-medical: Not on file  Tobacco Use  . Smoking status: Former Smoker    Years: 20.00    Types: Cigarettes    Last attempt to quit: 04/26/2007    Years since quitting: 11.1  . Smokeless tobacco: Never Used  . Tobacco comment: QUIT 2009  Substance and Sexual Activity  . Alcohol use: No    Alcohol/week: 0.0 standard drinks  . Drug use: No  . Sexual  activity: Not Currently    Partners: Male    Birth control/protection: Condom    Comment: pt. given condoms  Lifestyle  . Physical activity:    Days per week: Not on file    Minutes per session: Not on file  . Stress: Not on file  Relationships  . Social connections:    Talks on phone: Not on file    Gets together: Not on file    Attends religious service: Not on file    Active member of club or organization: Not on file    Attends meetings of clubs or organizations: Not on file    Relationship status: Not on file  Other Topics Concern  . Not on file  Social History Narrative   Alternate # [phone]   Past Surgical History:  Procedure Laterality Date  . BUNIONECTOMY     b/l  . COLECTOMY     2003 for diverticulitis, had colostomy bag and then reversed  . COLONOSCOPY    . HAND SURGERY    . NASAL SINUS SURGERY    . SHOULDER SURGERY     left  . TONSILLECTOMY     Past Medical History:  Diagnosis Date  . Allergic  rhinitis 05/09/2006  . Allergy   . Anxiety   . Arthritis   . Asthma   . CHF (congestive heart failure) ([place])   . Chronic back pain   . Diabetes mellitus without complication ([place])   . GERD (gastroesophageal reflux disease)   . Heart murmur    as a child  . 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 s/p 12 week course of simeprevir and sofosbuvir between October 2014 and January 2015 with resolution.  Marland Kitchen History of shingles   . HIV infection ([place])    1994  . Hyperlipidemia    no meds taken now  . Hypertension   . Pituitary microadenoma ([place]) 08/08/2014  . Prediabetes   . Refusal of blood transfusions as patient is Jehovah's Witness   . Secondary adrenal insufficiency ([place]) 06/29/2014  . Urticaria    BP 125/80   Pulse 79   Ht 5\' 1"  (1.549 m)   Wt 178 lb (80.7 kg)   SpO2 92%   BMI 33.63 kg/m   Opioid Risk Score:   Fall Risk Score:  `1  Depression screen PHQ 2/9  Depression screen [facility] 2/9 01/11/2018 01/05/2018 12/04/2017 11/28/2017 09/19/2017 07/21/2017 05/18/2017  Decreased Interest 0 0 0 0 0 0 0  Down, Depressed, Hopeless 0 0 0 0 0 0 0  PHQ - 2 Score 0 0 0 0 0 0 0  Altered sleeping - - - - - - -  Tired, decreased energy - - - - - - -  Change in appetite - - - - - - -  Feeling bad or failure about yourself  - - - - - - -  Trouble concentrating - - - - - - -  Moving slowly or fidgety/restless - - - - - - -  Suicidal thoughts - - - - - - -  PHQ-9 Score - - - - - - -  Some recent data might be hidden    Review of Systems  Constitutional: Positive for appetite change, diaphoresis and unexpected weight change.  HENT: Negative.   Eyes: Negative.   Respiratory: Negative.   Cardiovascular: Negative.   Gastrointestinal: Positive for nausea.  Endocrine: Negative.   Genitourinary: Negative.   Musculoskeletal: Positive  for back pain and gait problem.  Skin: Negative.   Allergic/Immunologic: Negative.   Neurological: Positive for dizziness.   Hematological: Negative.   Psychiatric/Behavioral: The patient is nervous/anxious.   All other systems reviewed and are negative.      Objective:   Physical Exam Vitals signs and nursing note reviewed.  Constitutional:      Appearance: Normal appearance. She is obese.  HENT:     Head: Normocephalic and atraumatic.     Mouth/Throat:     Mouth: Mucous membranes are moist.  Eyes:     Conjunctiva/sclera: Conjunctivae normal.  Musculoskeletal:     Lumbar back: She exhibits decreased range of motion and tenderness. She exhibits no deformity.     Comments: Patient has tenderness over the right PSIS but not over the left side.  Neurological:     General: No focal deficit present.     Mental Status: She is alert and oriented to person, place, and time.     Motor: No weakness or tremor.     Coordination: Coordination normal.     Comments: Motor strength is 5/5 bilateral hip flexor knee extensor ankle dorsiflexor Negative straight leg raise  Psychiatric:        Mood and Affect: Mood normal.        Behavior: Behavior normal.           Assessment & Plan:  1.  Bilateral sacroiliac pain.  She has had good relief with both left-sided and right-sided sacroiliac injections under fluoroscopic guidance.  She has pain that limits her activity tolerance which is hindering her efforts at exercise. Would recommend doing bilateral sacroiliac injections once she is at the 50-month mark after the most recent left sacroiliac injection performed on 04/02/2018.  This was discussed with the patient and she agrees with the plan.  May need every 63-month injections to keep her pain under good control and allow her to increase her activity level. We discussed exercise to improve quadricep as well as hip extensor strength.  She does have access to a gym.

## 2018-06-14 ENCOUNTER — Other Ambulatory Visit: Payer: Self-pay | Admitting: Internal Medicine

## 2018-06-18 DIAGNOSIS — E876 Hypokalemia: Secondary | ICD-10-CM | POA: Diagnosis not present

## 2018-06-19 LAB — HEMOGLOBIN A1C: A1c: 6.1

## 2018-06-25 ENCOUNTER — Telehealth: Payer: Self-pay | Admitting: Neurology

## 2018-06-25 ENCOUNTER — Encounter: Payer: Self-pay | Admitting: Neurology

## 2018-06-25 ENCOUNTER — Ambulatory Visit: Payer: Medicaid Other | Admitting: Neurology

## 2018-06-25 VITALS — BP 155/91 | HR 90 | Ht 61.0 in | Wt 178.0 lb

## 2018-06-25 DIAGNOSIS — D352 Benign neoplasm of pituitary gland: Secondary | ICD-10-CM

## 2018-06-25 DIAGNOSIS — E099 Drug or chemical induced diabetes mellitus without complications: Secondary | ICD-10-CM | POA: Diagnosis not present

## 2018-06-25 DIAGNOSIS — T380X5A Adverse effect of glucocorticoids and synthetic analogues, initial encounter: Secondary | ICD-10-CM | POA: Diagnosis not present

## 2018-06-25 DIAGNOSIS — G8929 Other chronic pain: Secondary | ICD-10-CM

## 2018-06-25 DIAGNOSIS — R51 Headache: Secondary | ICD-10-CM | POA: Diagnosis not present

## 2018-06-25 MED ORDER — DIAZEPAM 5 MG PO TABS: ORAL_TABLET | ORAL | 0 refills | Status: DC

## 2018-06-25 MED ORDER — SUMATRIPTAN SUCCINATE 25 MG PO TABS: 25.0000 mg | ORAL_TABLET | ORAL | 6 refills | Status: DC | PRN

## 2018-06-25 MED ORDER — TOPIRAMATE 50 MG PO TABS: 50.0000 mg | ORAL_TABLET | Freq: Two times a day (BID) | ORAL | 11 refills | Status: DC

## 2018-06-25 NOTE — Telephone Encounter (Signed)
medicaid order sent to [facility]. They will obtain the auth and reach out to the pt to schedule.  °

## 2018-06-25 NOTE — Progress Notes (Signed)
PATIENT: [name] [name] DOB: [birth year]  Chief Complaint  Patient presents with  . Migraine    She has mild headaches daily with 2-3 days of severe pain each week.  She does not medicate her migraines.  She lays down to rest in a quiet room.  Marland Kitchen PCP    Heber Guthrie, Elza Rafter, DO     HISTORICAL  [name] [name] is a 58 year old female, seen in request by her primary care physician Dr. Heber Oliver, Elza Rafter, for evaluation of migraine headache, initial evaluation was on June 25, 2018.  I have reviewed and summarized the referring note from the referring physician.  She had a past medical history of hyperlipidemia, allergy.  She has a history of pituitary adenoma, under close supervision of endocrinologist Dr. 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  She also have a history of HIV, hepatitis C that was treated, Addison's disease, on high dose of hydrocortisone treatment, steroid induced diabetes,  She reported history of migraine headache when she was young, her typical migraine are bilateral temporal, retro-orbital area severe pressure headache with light noise sensitivity, nauseous, prefer to lie down in dark quiet room.  In October 2019, she began to have different kind of headaches, this started with dizziness, few things were spinning around, worsening with sudden positional movement, no hearing loss, no tinnitus, later she developed this constant bilateral temporal frontal area pressure headaches, difficulty focusing, once a week, it would exacerbated to a more severe headache with light noise sensitivity, nauseous, eating better lying down sleep.  REVIEW OF SYSTEMS: Full 14 system review of systems performed and notable only for headache.  All other  review of systems were negative.  ALLERGIES: Allergies  Allergen Reactions  . Acetaminophen Other (See Comments)    Inflamed liver, hospitalized REACTION: Had liver problems, was hospitalized Inflamed liver, hospitalized  . Morphine Hives and Rash    REACTION: rash  . Triamterene Hives  . Dyazide [Hydrochlorothiazide W-Triamterene] Hives  . Lisinopril     Cough   . Losartan Rash    Pt had rash, worsening dizziness, and nausea after starting losartan, which improved after stopping losartan    HOME MEDICATIONS: Current Outpatient Medications  Medication Sig Dispense Refill  . amLODipine (NORVASC) 10 MG tablet Take 1 tablet (10 mg total) by mouth daily. 90 tablet 3  . atorvastatin (LIPITOR) 10 MG tablet Take 1 tablet (10 mg total) by mouth daily. (Patient taking differently: Take 5 mg by mouth daily. ) 90 tablet 3  . BIKTARVY 50-200-25 MG TABS tablet TAKE 1 TABLET BY MOUTH DAILY. 30 tablet 5  . budesonide (PULMICORT FLEXHALER) 180 MCG/ACT inhaler Inhale 2 puffs into the lungs daily. Rinse, gargle, and spit after use. 1 Inhaler 5  . cetirizine (ZYRTEC) 10 MG tablet TAKE 1 TABLET BY MOUTH EVERY DAY AS NEEDED FOR ALLERGIES 90 tablet 3  . diazepam (VALIUM) 5 MG tablet TAKE ONE TABLET BY MOUTH AT BEDTIME AS NEEDED FOR ANXIETY 30 tablet 0  . diphenhydrAMINE (DIPHENHIST) 25 MG tablet TAKE 1 tablet AS NEEDED FOR ITCHING. 30 tablet 3  . hydrocortisone (CORTEF) 10 MG tablet Take 10 mg by mouth as directed. Pt takes 10 mg in the am and 5 mg at 11 am and 3 pm    .  liraglutide (VICTOZA) 18 MG/3ML SOPN Inject into the skin.    . metoprolol tartrate (LOPRESSOR) 25 MG tablet Take 1 tablet (25 mg total) by mouth 2 (two) times daily. 60 tablet 11  . montelukast (SINGULAIR) 10 MG tablet TAKE 1 TABLET (10 MG TOTAL) BY MOUTH DAILY. 90 tablet 0  . neomycin-polymyxin-hydrocortisone (CORTISPORIN) 3.5-10000-1 OTIC suspension PLACE THREE DROPS INTO THE LEFT  EAR USE 4 TIMES DAILY FOR 10 DAYS.    Marland Kitchen nitroGLYCERIN  (NITROSTAT) 0.4 MG SL tablet Place 1 tablet (0.4 mg total) under the tongue every 5 (five) minutes as needed for chest pain. 25 tablet 1  . pantoprazole (PROTONIX) 40 MG tablet Take 1 tablet (40 mg total) by mouth daily. 90 tablet 2  . PROAIR HFA 108 (90 Base) MCG/ACT inhaler INHALE TWO PUFFS EVERY FOUR TO SIX HOURS AS NEEDED FOR COUGH OR WHEEZE 8.5 g 1  . promethazine (PHENERGAN) 25 MG tablet Take 1 tablet (25 mg total) by mouth every 6 (six) hours as needed for nausea or vomiting. 30 tablet 3  . ranitidine (ZANTAC) 300 MG tablet Take 1 tablet (300 mg total) by mouth at bedtime. 30 tablet 5  . sodium chloride (OCEAN) 0.65 % SOLN nasal spray PLACE 1 SPRAY INTO BOTH NOSTRILS AS NEEDED FOR CONGESTION. 3 Bottle 0  . Vitamin D, Cholecalciferol, 25 MCG (1000 UT) CAPS Take 1,000 mg by mouth daily. 90 capsule 2   No current facility-administered medications for this visit.     PAST MEDICAL HISTORY: Past Medical History:  Diagnosis Date  . Allergic rhinitis 05/09/2006  . Allergy   . Anxiety   . Arthritis   . Asthma   . CHF (congestive heart failure) ([place])   . Chronic back pain   . Diabetes mellitus without complication ([place])   . GERD (gastroesophageal reflux disease)   . Heart murmur    as a child  . 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 s/p 12 week course of simeprevir and sofosbuvir between October 2014 and January 2015 with resolution.  Marland Kitchen History of shingles   . HIV infection ([place])    1994  . Hyperlipidemia    no meds taken now  . Hypertension   . Migraine   . Pituitary microadenoma ([place]) 08/08/2014  . Prediabetes   . Refusal of blood transfusions as patient is Jehovah's Witness   . Secondary adrenal insufficiency ([place]) 06/29/2014  . Urticaria     PAST SURGICAL HISTORY: Past Surgical History:  Procedure Laterality Date  . BUNIONECTOMY     b/l  . COLECTOMY     2003 for diverticulitis, had colostomy bag and then reversed  . COLONOSCOPY    .  HAND SURGERY    . NASAL SINUS SURGERY    . SHOULDER SURGERY     left  . TONSILLECTOMY      FAMILY HISTORY: Family History  Problem Relation Age of Onset  . Heart disease Mother   . Diabetes Mother   . Stroke Mother   . Heart disease Father   . Stroke Father   . Diabetes Father   . Hepatitis Sister        hcv  . Asthma Sister   . Allergic rhinitis Sister   . Stroke Other   . Colon polyps Brother   . Renal Disease Brother   . Cancer Sister        lung  . Asthma Sister   . Allergic rhinitis Sister   .  Cancer Maternal Aunt   . Cancer Maternal Aunt   . Colon cancer Neg Hx   . Esophageal cancer Neg Hx   . Stomach cancer Neg Hx   . Rectal cancer Neg Hx   . Angioedema Neg Hx   . Eczema Neg Hx   . Urticaria Neg Hx     SOCIAL HISTORY: Social History   Socioeconomic History  . Marital status: Single    Spouse name: Not on file  . Number of children: 0  . Years of education: 66  . Highest education level: High school graduate  Occupational History  . Occupation: Disabled  Social Needs  . Financial resource strain: Not on file  . Food insecurity:    Worry: Not on file    Inability: Not on file  . Transportation needs:    Medical: Not on file    Non-medical: Not on file  Tobacco Use  . Smoking status: Former Smoker    Years: 20.00    Types: Cigarettes    Last attempt to quit: 04/26/2007    Years since quitting: 11.1  . Smokeless tobacco: Never Used  . Tobacco comment: QUIT 2009  Substance and Sexual Activity  . Alcohol use: No    Alcohol/week: 0.0 standard drinks  . Drug use: No  . Sexual activity: Not Currently    Partners: Male    Birth control/protection: Condom    Comment: pt. given condoms  Lifestyle  . Physical activity:    Days per week: Not on file    Minutes per session: Not on file  . Stress: Not on file  Relationships  . Social connections:    Talks on phone: Not on file    Gets together: Not on file    Attends religious service: Not on  file    Active member of club or organization: Not on file    Attends meetings of clubs or organizations: Not on file    Relationship status: Not on file  . Intimate partner violence:    Fear of current or ex partner: Not on file    Emotionally abused: Not on file    Physically abused: Not on file    Forced sexual activity: Not on file  Other Topics Concern  . Not on file  Social History Narrative   Lives at home alone.   Right-handed.   Occasional tea or soda.   Te     PHYSICAL EXAM   Vitals:   06/25/18 0830  BP: (!) 155/91  Pulse: 90  Weight: 178 lb (80.7 kg)  Height: _0  (1.549 m)    Not recorded      Body mass index is 33.63 kg/m.  PHYSICAL EXAMNIATION:  Gen: NAD, conversant, well nourised, obese, well groomed                     Cardiovascular: Regular rate rhythm, no peripheral edema, warm, nontender. Eyes: Conjunctivae clear without exudates or hemorrhage Neck: Supple, no carotid bruits. Pulmonary: Clear to auscultation bilaterally   NEUROLOGICAL EXAM:  MENTAL STATUS: Speech:    Speech is normal; fluent and spontaneous with normal comprehension.  Cognition:     Orientation to time, place and person     Normal recent and remote memory     Normal Attention span and concentration     Normal Language, naming, repeating,spontaneous speech     Fund of knowledge   CRANIAL NERVES: CN II: Visual fields are full to confrontation. Pupils are  round equal and briskly reactive to light. CN III, IV, VI: extraocular movement are normal. No ptosis. CN V: Facial sensation is intact to pinprick in all 3 divisions bilaterally. Corneal responses are intact.  CN VII: Face is symmetric with normal eye closure and smile. CN VIII: Hearing is normal to rubbing fingers CN IX, X: Palate elevates symmetrically. Phonation is normal. CN XI: Head turning and shoulder shrug are intact CN XII: Tongue is midline with normal movements and no atrophy.  MOTOR: There is no  pronator drift of out-stretched arms. Muscle bulk and tone are normal. Muscle strength is normal.  REFLEXES: Reflexes are 2+ and symmetric at the biceps, triceps, knees, and ankles. Plantar responses are flexor.  SENSORY: Intact to light touch, pinprick, positional sensation and vibratory sensation are intact in fingers and toes.  COORDINATION: Rapid alternating movements and fine finger movements are intact. There is no dysmetria on finger-to-nose and heel-knee-shin.    GAIT/STANCE: Needs to push up to get up from seated position, mildly antalgic, Romberg is absent.   DIAGNOSTIC DATA (LABS, IMAGING, TESTING) - I reviewed patient records, labs, notes, testing and imaging myself where available.   ASSESSMENT AND PLAN  [name] [name] is a 58 y.o. female   Chronic daily headaches  Likely due to exacerbation of chronic migraine  Imitrex 25 mg as needed  ESR C-reactive protein to rule out inflammatory process  Pituitary microadenoma  MRI of the brain with without contrast      Marcial Pacas, M.D. Ph.D.  [facility] [address] [place], [place] [address] Ph: [phone] Fax: [phone]  CC:[name], DO

## 2018-06-26 LAB — C-REACTIVE PROTEIN: CRP: 2 mg/L (ref 0–10)

## 2018-06-26 LAB — SEDIMENTATION RATE: Sed Rate: 26 mm/hr (ref 0–40)

## 2018-06-28 ENCOUNTER — Ambulatory Visit: Payer: Medicaid Other | Admitting: Internal Medicine

## 2018-06-28 DIAGNOSIS — Z79899 Other long term (current) drug therapy: Secondary | ICD-10-CM

## 2018-06-28 DIAGNOSIS — I10 Essential (primary) hypertension: Secondary | ICD-10-CM

## 2018-06-28 NOTE — Progress Notes (Signed)
   CC: Follow up on essential hypertension   HPI:  [name] is a 58 y.o.  female with history noted below that presents to the [facility] for follow up on essential hypertension.  Please see problem based charting for the status of patient's chronic medical conditions.  Past Medical History:  Diagnosis Date  . Allergic rhinitis 05/09/2006  . Allergy   . Anxiety   . Arthritis   . Asthma   . CHF (congestive heart failure) ([place])   . Chronic back pain   . Diabetes mellitus without complication ([place])   . GERD (gastroesophageal reflux disease)   . Heart murmur    as a child  . 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 s/p 12 week course of simeprevir and sofosbuvir between October 2014 and January 2015 with resolution.  Marland Kitchen History of shingles   . HIV infection ([place])    1994  . Hyperlipidemia    no meds taken now  . Hypertension   . Migraine   . Pituitary microadenoma ([place]) 08/08/2014  . Prediabetes   . Refusal of blood transfusions as patient is Jehovah's Witness   . Secondary adrenal insufficiency ([place]) 06/29/2014  . Urticaria     Review of Systems:  Review of Systems  Constitutional: Negative for chills and fever.  Respiratory: Negative for shortness of breath.   Cardiovascular: Negative for chest pain.  Musculoskeletal: Negative for falls.    Physical Exam:  There were no vitals filed for this visit.  Physical Exam  Constitutional: She is well-developed, well-nourished, and in no distress.  Cardiovascular: Normal rate, regular rhythm and normal heart sounds. Exam reveals no gallop and no friction rub.  No murmur heard. Pulmonary/Chest: Effort normal and breath sounds normal. No respiratory distress. She has no wheezes. She has no rales.     Assessment & Plan:   See encounters tab for problem based medical decision making.   Patient discussed with Dr. Dareen Piano

## 2018-06-28 NOTE — Patient Instructions (Addendum)
[name] [name],  It was a pleasure seeing you today.  Please follow up in 6 months.

## 2018-07-02 ENCOUNTER — Other Ambulatory Visit: Payer: Self-pay | Admitting: Allergy and Immunology

## 2018-07-02 ENCOUNTER — Other Ambulatory Visit: Payer: Self-pay | Admitting: Internal Medicine

## 2018-07-02 ENCOUNTER — Other Ambulatory Visit: Payer: Self-pay | Admitting: Infectious Diseases

## 2018-07-02 DIAGNOSIS — B20 Human immunodeficiency virus [HIV] disease: Secondary | ICD-10-CM

## 2018-07-03 ENCOUNTER — Ambulatory Visit
Admission: RE | Admit: 2018-07-03 | Discharge: 2018-07-03 | Disposition: A | Discharge status: Home / Self Care | Payer: Medicaid Other | Source: Ambulatory Visit | Attending: Neurology | Admitting: Neurology

## 2018-07-03 DIAGNOSIS — T380X5A Adverse effect of glucocorticoids and synthetic analogues, initial encounter: Secondary | ICD-10-CM

## 2018-07-03 DIAGNOSIS — R51 Headache: Secondary | ICD-10-CM

## 2018-07-03 DIAGNOSIS — E099 Drug or chemical induced diabetes mellitus without complications: Secondary | ICD-10-CM

## 2018-07-03 DIAGNOSIS — D352 Benign neoplasm of pituitary gland: Secondary | ICD-10-CM

## 2018-07-03 DIAGNOSIS — R519 Headache, unspecified: Secondary | ICD-10-CM

## 2018-07-03 MED ORDER — GADOBENATE DIMEGLUMINE 529 MG/ML IV SOLN
10.0000 mL | Freq: Once | INTRAVENOUS | Status: AC | PRN
  Administered 2018-07-03: 10 mL via INTRAVENOUS

## 2018-07-03 NOTE — Assessment & Plan Note (Addendum)
Assessment: Essential hypertension Patient currently takes metoprolol 25mg  BID and amlodipine 10mg  daily.Goal <140/80.  Today/pressure is 142/86 . Patient would not like to make changes at this time to her blood pressure regimen.  Plan -Continue metoprolol25mg  BIDand amlodipine10mg  daily -consider increasing metoprolol if bp still uncontrolled at future visits

## 2018-07-04 NOTE — Progress Notes (Signed)
Internal Medicine Clinic Attending  Case discussed with Dr. Hoffman at the time of the visit.  We reviewed the resident's history and exam and pertinent patient test results.  I agree with the assessment, diagnosis, and plan of care documented in the resident's note.  

## 2018-07-06 ENCOUNTER — Telehealth: Payer: Self-pay | Admitting: *Deleted

## 2018-07-06 ENCOUNTER — Other Ambulatory Visit: Payer: Self-pay | Admitting: Internal Medicine

## 2018-07-06 NOTE — Telephone Encounter (Signed)
Spoke with pt. and reviewed below MRI results.  She verbalized understanding of same/fim 

## 2018-07-06 NOTE — Telephone Encounter (Signed)
-----   Message from Penni Bombard, MD sent at 07/06/2018 10:58 AM EDT ----- Stable MRI. No change from 2018. Please call patient. Continue current plan. Follow up with Dr. Desiree Lucy

## 2018-07-09 ENCOUNTER — Ambulatory Visit: Payer: Medicaid Other | Admitting: Physical Medicine & Rehabilitation

## 2018-07-13 ENCOUNTER — Ambulatory Visit: Payer: Medicaid Other | Admitting: Physical Medicine & Rehabilitation

## 2018-07-31 ENCOUNTER — Other Ambulatory Visit: Payer: Self-pay

## 2018-07-31 ENCOUNTER — Encounter: Payer: Self-pay | Admitting: Allergy and Immunology

## 2018-07-31 ENCOUNTER — Ambulatory Visit: Payer: Medicaid Other | Admitting: Allergy and Immunology

## 2018-07-31 VITALS — BP 130/88 | HR 90 | Temp 98.1°F | Resp 18

## 2018-07-31 DIAGNOSIS — L299 Pruritus, unspecified: Secondary | ICD-10-CM | POA: Diagnosis not present

## 2018-07-31 DIAGNOSIS — J4530 Mild persistent asthma, uncomplicated: Secondary | ICD-10-CM

## 2018-07-31 DIAGNOSIS — J3089 Other allergic rhinitis: Secondary | ICD-10-CM

## 2018-07-31 DIAGNOSIS — K219 Gastro-esophageal reflux disease without esophagitis: Secondary | ICD-10-CM

## 2018-07-31 MED ORDER — FAMOTIDINE 40 MG PO TABS: 40.0000 mg | ORAL_TABLET | Freq: Every day | ORAL | 5 refills | Status: DC

## 2018-07-31 NOTE — Progress Notes (Signed)
[place]   Follow-up Note  Referring Provider: Kalman Shan Ratlif* Primary Provider: [name], DO Date of Office Visit: 07/31/2018  Subjective:   [name] [name] (DOB: 01/01/1961) is a 58 y.o. female who returns to the [facility] on 07/31/2018 in re-evaluation of the following:  HPI: [name] [name] returns to this clinic in reevaluation of asthma and allergic rhinoconjunctivitis and reflux induced respiratory disease and a history of pruritus and headache.  Her last visit to this clinic was 08 May 2018.  [name] [name] has really done very well since her last visit without a requirement for systemic steroid or an antibiotic to treat any type of respiratory tract issue and rarely uses a short acting bronchodilator.  However, for the past 3 weeks she has had more itchy eyes and some sneezing and some nasal congestion and some itchy throat and some slight throat clearing like cough without any chest tightness or wheezing or sputum production or chest pain or significant anosmia or headaches or ugly nasal discharge.  She continues to consistently use anti-inflammatory agents for both her upper and lower airway.  She has performed house dust avoidance measures inside the household.  Her reflux is under good control at this point in time as long as she utilizes 2 agents to treat reflux.  However, because of an FDA decision we will need to replace her ranitidine.  She did visit with a neurologist regarding her headaches and is now using Topamax with good effect.  She has not really been having much problems with itchiness while using cetirizine a few times a day.  [name] [name] states that she has an appointment to have her HIV RNA titer checked this May.  Her last check was in 2018.  She remains on antiviral therapy with bictegravir-emtricitabine-tenofovir   Allergies as of 07/31/2018      Reactions   Acetaminophen Other (See  Comments)   Inflamed liver, hospitalized REACTION: Had liver problems, was hospitalized Inflamed liver, hospitalized   Morphine Hives, Rash   REACTION: rash   Triamterene Hives   Dyazide [hydrochlorothiazide W-triamterene] Hives   Lisinopril    Cough    Losartan Rash   Pt had rash, worsening dizziness, and nausea after starting losartan, which improved after stopping losartan      Medication List      amLODipine 10 MG tablet Commonly known as:  NORVASC TAKE 1 TABLET (10 MG TOTAL) BY MOUTH DAILY.   atorvastatin 10 MG tablet Commonly known as:  LIPITOR Take 1 tablet (10 mg total) by mouth daily.   Biktarvy 50-200-25 MG Tabs tablet Generic drug:  bictegravir-emtricitabine-tenofovir AF TAKE ONE TABLET BY MOUTH DAILY   budesonide 180 MCG/ACT inhaler Commonly known as:  Pulmicort Flexhaler Inhale 2 puffs into the lungs daily. Rinse, gargle, and spit after use.   cetirizine 10 MG tablet Commonly known as:  ZYRTEC TAKE 1 TABLET BY MOUTH EVERY DAY AS NEEDED FOR ALLERGIES   diazepam 5 MG tablet Commonly known as:  VALIUM TAKE ONE TABLET BY MOUTH AT BEDTIME AS NEEDED FOR ANXIETY   diazepam 5 MG tablet Commonly known as:  Valium Take 1-2 tablets 30 minutes prior to MRI, may repeat once as needed. Must have driver.   diphenhydrAMINE 25 MG tablet Commonly known as:  Diphenhist TAKE 1 tablet AS NEEDED FOR ITCHING.   hydrocortisone 10 MG tablet Commonly known as:  CORTEF Take 10 mg by mouth as directed. Pt takes 10  mg in the am and 5 mg at 11 am and 3 pm   liraglutide 18 MG/3ML Sopn Commonly known as:  VICTOZA Inject into the skin.   metoprolol tartrate 25 MG tablet Commonly known as:  LOPRESSOR Take 1 tablet (25 mg total) by mouth 2 (two) times daily.   montelukast 10 MG tablet Commonly known as:  SINGULAIR TAKE 1 TABLET (10 MG TOTAL) BY MOUTH DAILY.   nitroGLYCERIN 0.4 MG SL tablet Commonly known as:  NITROSTAT Place 1 tablet (0.4 mg total) under the tongue every  5 (five) minutes as needed for chest pain.   pantoprazole 40 MG tablet Commonly known as:  PROTONIX TAKE ONE TABLET BY MOUTH DAILY   ProAir HFA 108 (90 Base) MCG/ACT inhaler Generic drug:  albuterol INHALE TWO PUFFS BY MOUTH EVERY FOUR TO SIX HOURS AS NEEDED FOR COUGH OR WHEEZE   promethazine 25 MG tablet Commonly known as:  PHENERGAN Take 1 tablet (25 mg total) by mouth every 6 (six) hours as needed for nausea or vomiting.   ranitidine 300 MG tablet Commonly known as:  ZANTAC TAKE ONE TABLET BY MOUTH AT BEDTIME   sodium chloride 0.65 % Soln nasal spray Commonly known as:  OCEAN PLACE 1 SPRAY INTO BOTH NOSTRILS AS NEEDED FOR CONGESTION.   SUMAtriptan 25 MG tablet Commonly known as:  Imitrex Take 1 tablet (25 mg total) by mouth every 2 (two) hours as needed for migraine. May repeat in 2 hours if headache persists or recurs.   Vitamin D (Cholecalciferol) 25 MCG (1000 UT) Caps Take 1,000 mg by mouth daily.       Past Medical History:  Diagnosis Date  . Allergic rhinitis 05/09/2006  . Allergy   . Anxiety   . Arthritis   . Asthma   . CHF (congestive heart failure) ([place])   . Chronic back pain   . Diabetes mellitus without complication ([place])   . GERD (gastroesophageal reflux disease)   . Heart murmur    as a child  . 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 s/p 12 week course of simeprevir and sofosbuvir between October 2014 and January 2015 with resolution.  Marland Kitchen History of shingles   . HIV infection ([place])    1994  . Hyperlipidemia    no meds taken now  . Hypertension   . Migraine   . Pituitary microadenoma ([place]) 08/08/2014  . Prediabetes   . Refusal of blood transfusions as patient is Jehovah's Witness   . Secondary adrenal insufficiency ([place]) 06/29/2014  . Urticaria     Past Surgical History:  Procedure Laterality Date  . BUNIONECTOMY     b/l  . COLECTOMY     2003 for diverticulitis, had colostomy bag and then reversed  .  COLONOSCOPY    . HAND SURGERY    . NASAL SINUS SURGERY    . SHOULDER SURGERY     left  . TONSILLECTOMY      Review of systems negative except as noted in HPI / PMHx or noted below:  Review of Systems  Constitutional: Negative.   HENT: Negative.   Eyes: Negative.   Respiratory: Negative.   Cardiovascular: Negative.   Gastrointestinal: Negative.   Genitourinary: Negative.   Musculoskeletal: Negative.   Skin: Negative.   Neurological: Negative.   Endo/Heme/Allergies: Negative.   Psychiatric/Behavioral: Negative.      Objective:   Vitals:   07/31/18 1541  BP: 130/88  Pulse: 90  Resp:  18  Temp: 98.1 F (36.7 C)  SpO2: 98%          Physical Exam Constitutional:      Appearance: She is not diaphoretic.  HENT:     Head: Normocephalic.     Right Ear: Ear canal and external ear normal. Tympanic membrane is perforated.     Left Ear: Ear canal and external ear normal. Tympanic membrane is perforated.     Nose: Nose normal. No mucosal edema or rhinorrhea.     Mouth/Throat:     Pharynx: Uvula midline. No oropharyngeal exudate.  Eyes:     Conjunctiva/sclera: Conjunctivae normal.  Neck:     Thyroid: No thyromegaly.     Trachea: Trachea normal. No tracheal tenderness or tracheal deviation.  Cardiovascular:     Rate and Rhythm: Normal rate and regular rhythm.     Heart sounds: Normal heart sounds, S1 normal and S2 normal. No murmur.  Pulmonary:     Effort: No respiratory distress.     Breath sounds: Normal breath sounds. No stridor. No wheezing or rales.  Lymphadenopathy:     Head:     Right side of head: No tonsillar adenopathy.     Left side of head: No tonsillar adenopathy.     Cervical: No cervical adenopathy.  Skin:    Findings: No erythema or rash.     Nails: There is no clubbing.   Neurological:     Mental Status: She is alert.     Diagnostics:    Spirometry was performed and demonstrated an FEV1 of 1.44 at 75 % of predicted.    Assessment and Plan:    1. Asthma, well controlled, mild persistent   2. Other allergic rhinitis   3. Pruritic disorder   4. Gastroesophageal reflux disease, esophagitis presence not specified     1.  Continue to Treat and prevent inflammation:   A.  Montelukast 10 mg - 1 tablet 1 time per day  B.  Pulmicort 180 -2 inhalations 1 time per day    C.  OTC Nasacort -1 spray each nostril 1 time per day  2.  Continue to Treat and prevent reflux:   A.  Protonix 40 mg in a.m.  B.  FAMOTIDINE 40 mg in p.m. (replaces ranitidine)  3.  Can use cetirizine 10 mg -1-2 tablets 1-2 times a day (maximum 40mg )  4.  If needed:   A.  OTC Benadryl  B.  OTC nasal saline  C.  Pro Air HFA 2 puffs every 4-6 hours  D. Nasal saline  5. Hydration of skin with bath / shower followed by Vaseline while wet.  6. Return to clinic in 12 weeks or earlier if problem  [name] [name] appears to be doing quite well except for the past 3 weeks where she has had a little bit more problems with her respiratory tract.  Fortunately, there is no febrile component to this issue and her symptoms are relatively mild.  We will get her to perform some nasal saline and antihistamine in addition to the anti-inflammatory agents directed against respiratory tract inflammation.  She will remain on treatment for reflux as this has been working quite well but we need to change her ranitidine to famotidine based upon the FDA recall of ranitidine.  Assuming she does okay I will see her back in this clinic in the summer 2020 or earlier if there is a problem.  Allena Katz, MD Allergy / Immunology [place]

## 2018-07-31 NOTE — Patient Instructions (Addendum)
  1.  Continue to Treat and prevent inflammation:   A.  Montelukast 10 mg - 1 tablet 1 time per day  B.  Pulmicort 180 -2 inhalations 1 time per day    C.  OTC Nasacort -1 spray each nostril 1 time per day  2.  Continue to Treat and prevent reflux:   A.  Protonix 40 mg in a.m.  B.  FAMOTIDINE 40 mg in p.m. (replaces ranitidine)  3.  Can use cetirizine 10 mg -1-2 tablets 1-2 times a day (maximum 40mg )  4.  If needed:   A.  OTC Benadryl  B.  OTC nasal saline  C.  Pro Air HFA 2 puffs every 4-6 hours  D. Nasal saline  5. Hydration of skin with bath / shower followed by Vaseline while wet.  6. Return to clinic in 12 weeks or earlier if problem

## 2018-08-01 ENCOUNTER — Encounter: Payer: Self-pay | Admitting: Allergy and Immunology

## 2018-08-10 ENCOUNTER — Ambulatory Visit: Payer: Medicaid Other

## 2018-08-10 ENCOUNTER — Ambulatory Visit: Payer: Medicaid Other | Admitting: Physical Medicine & Rehabilitation

## 2018-08-21 ENCOUNTER — Telehealth: Payer: Self-pay

## 2018-08-21 NOTE — Telephone Encounter (Signed)
Spoke with the patient and she stated that she doesn't have access to intenet to do a webex visit. She gave verbal consent to do a telephone visit and to file her insurance.

## 2018-08-27 ENCOUNTER — Other Ambulatory Visit: Payer: Self-pay

## 2018-08-27 ENCOUNTER — Encounter: Payer: Self-pay | Admitting: Neurology

## 2018-08-27 ENCOUNTER — Ambulatory Visit (INDEPENDENT_AMBULATORY_CARE_PROVIDER_SITE_OTHER): Payer: Medicaid Other | Admitting: Neurology

## 2018-08-27 DIAGNOSIS — G43709 Chronic migraine without aura, not intractable, without status migrainosus: Secondary | ICD-10-CM | POA: Diagnosis not present

## 2018-08-27 DIAGNOSIS — D352 Benign neoplasm of pituitary gland: Secondary | ICD-10-CM | POA: Diagnosis not present

## 2018-08-27 DIAGNOSIS — IMO0002 Reserved for concepts with insufficient information to code with codable children: Secondary | ICD-10-CM

## 2018-08-27 NOTE — Progress Notes (Signed)
Virtual Visit via Telephone Note  I connected with [name] [name] on 08/27/18 at  9:15 AM EDT by telephone and verified that I am speaking with the correct person using two identifiers.   I discussed the limitations, risks, security and privacy concerns of performing an evaluation and management service by telephone and the availability of in person appointments. I also discussed with the patient that there may be a patient responsible charge related to this service. The patient expressed understanding and agreed to proceed.   History of Present Illness: [name] [name] is a 58 year old female, seen in request by her primary care physician Dr. Heber [place], Elza Rafter, for evaluation of migraine headache, initial evaluation was on June 25, 2018.  I have reviewed and summarized the referring note from the referring physician.  She had a past medical history of hyperlipidemia, allergy.  She has a history of pituitary adenoma, under close supervision of endocrinologist Dr. 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  She also have a history of HIV, hepatitis C that was treated, Addison's disease, on high dose of hydrocortisone treatment, steroid induced diabetes,  She reported history of migraine headache when she was young, her typical migraine are bilateral temporal, retro-orbital area severe pressure headache with light noise sensitivity, nauseous, prefer to lie down in dark quiet room.  In October 2019, she began to have different kind of headaches, this started with dizziness, few things were spinning around, worsening with sudden positional movement, no hearing loss, no tinnitus, later she developed this constant bilateral temporal frontal area pressure headaches,  difficulty focusing, once a week, it would exacerbated to a more severe headache with light noise sensitivity, nauseous, eating better lying down sleep.  Update Aug 27, 2018 SS: CMP, sed rate in March 2020 was unremarkable, MRI of the brain in March 2020, no change from 2018  She says that her last visit with Dr. Krista Blue, she was started on Topamax 50 mg twice a day.  She reports the Topamax has been very beneficial for her headaches.  Since starting the Topamax, she is having about 2 headaches a week, but are not bad enough to take any medication for relief.  She has not taken the Imitrex.  She reports a cough, allergies.  She reports if she did not have allergies or cough, she thinks she would not have a headache.  She also takes Valium for chronic insomnia.  She reports overall she is doing much better with the addition of Topamax.  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  She denies any new problems or concerns.    Observations/Objective: Telephone call, alert, oriented, answers questions appropriately, knowledgeable health condition   MRI of the brain 07/03/2018 IMPRESSION:   MRI brain and pituitary (with and without) demonstrating: - Subtle hypoenhancing lesion noted in the left pituitary gland, measuring 3 mm. - Few periventricular and subcortical foci of non-specific T2 hyperintensities. - No acute findings. No change from MRI on 06/24/16.   Assessment and Plan: 1.  Chronic daily headaches 2.  Pituitary microadenoma  Her headaches are much improved with Topamax 50 mg twice a day.  She will continue taking the medication.  She has not had to take Imitrex.  She reports her headaches  are now not bad enough to require medication intervention.  She had MRI of the brain in March 2020, to evaluate pituitary microadenoma, there is no change from 2018.  Follow Up Instructions: 4 to 6 months for an office revisit   I discussed the assessment and treatment plan with the  patient. The patient was provided an opportunity to ask questions and all were answered. The patient agreed with the plan and demonstrated an understanding of the instructions.   The patient was advised to call back or seek an in-person evaluation if the symptoms worsen or if the condition fails to improve as anticipated.  I provided 23 minutes of non-face-to-face time during this encounter.  Evangeline Dakin, DNP  [facility] [address], [place], [place] [address] [phone]

## 2018-08-28 NOTE — Progress Notes (Signed)
I have reviewed and agreed above plan. 

## 2018-08-30 ENCOUNTER — Other Ambulatory Visit: Payer: Self-pay

## 2018-08-30 ENCOUNTER — Other Ambulatory Visit: Payer: Medicaid Other

## 2018-08-30 ENCOUNTER — Other Ambulatory Visit (HOSPITAL_COMMUNITY)
Admission: RE | Admit: 2018-08-30 | Discharge: 2018-08-30 | Disposition: A | Discharge status: Home / Self Care | Payer: Medicaid Other | Source: Ambulatory Visit | Attending: Infectious Diseases | Admitting: Infectious Diseases

## 2018-08-30 DIAGNOSIS — Z113 Encounter for screening for infections with a predominantly sexual mode of transmission: Secondary | ICD-10-CM | POA: Diagnosis not present

## 2018-08-30 DIAGNOSIS — B20 Human immunodeficiency virus [HIV] disease: Secondary | ICD-10-CM

## 2018-08-30 DIAGNOSIS — Z79899 Other long term (current) drug therapy: Secondary | ICD-10-CM | POA: Diagnosis not present

## 2018-08-31 ENCOUNTER — Other Ambulatory Visit: Payer: Self-pay | Admitting: Internal Medicine

## 2018-08-31 LAB — T-HELPER CELL (CD4) - (RCID CLINIC ONLY)
CD4 % Helper T Cell: 28 % — ABNORMAL LOW (ref 33–65)
CD4 T Cell Abs: 559 /uL (ref 400–1790)

## 2018-08-31 LAB — URINE CYTOLOGY ANCILLARY ONLY
Chlamydia: NEGATIVE
Neisseria Gonorrhea: NEGATIVE

## 2018-08-31 MED ORDER — ATORVASTATIN CALCIUM 10 MG PO TABS: 10.0000 mg | ORAL_TABLET | Freq: Every day | ORAL | 1 refills | Status: DC

## 2018-08-31 NOTE — Telephone Encounter (Signed)
REFILL REQUEST atorvastatin (LIPITOR) 10 MG tablet AT  [facility], [address] [phone] (Phone) [phone] (Fax)

## 2018-09-03 ENCOUNTER — Telehealth: Payer: Self-pay | Admitting: *Deleted

## 2018-09-03 NOTE — Telephone Encounter (Signed)
This is r/t 5/12 visit to [facility] for a cough NURSING TRIAGE NOTE FOR RESPIRATORY SYMPTOMS  Do you have a fever? " no, I havent checked it but this is my usual every year this time, allergies, I dont think I have the covid"  Do you have a cough? "yes, my usual, nothing else"  Do you have shortness of breath more than normal? "no, except from wearing these old masks but Ill do as they tell me"  Do you have chest pain?"i have angina anyway but its my normal"  Are you able to eat and drink normally? "lord, yes, laughing"  Have you seen a physician for these symptoms? "no"  Action keep hands clean Wear mask Drink lots of fluid Keep 26ft distance from others dont be near anyone you know is positive Keep hands and surfaces clean Gave entering hospital for clinic visit directions  I informed patient to expect a phone call from a physician soon and I sent request to front desk to schedule a virtual office appt for patient and arrive the patient.

## 2018-09-03 NOTE — Telephone Encounter (Signed)
I agree

## 2018-09-04 ENCOUNTER — Other Ambulatory Visit: Payer: Self-pay

## 2018-09-04 ENCOUNTER — Ambulatory Visit (INDEPENDENT_AMBULATORY_CARE_PROVIDER_SITE_OTHER): Payer: Medicaid Other | Admitting: Internal Medicine

## 2018-09-04 VITALS — BP 154/83 | HR 86 | Temp 98.1°F

## 2018-09-04 DIAGNOSIS — J309 Allergic rhinitis, unspecified: Secondary | ICD-10-CM

## 2018-09-04 DIAGNOSIS — I10 Essential (primary) hypertension: Secondary | ICD-10-CM | POA: Diagnosis not present

## 2018-09-04 DIAGNOSIS — E271 Primary adrenocortical insufficiency: Secondary | ICD-10-CM | POA: Diagnosis not present

## 2018-09-04 DIAGNOSIS — J302 Other seasonal allergic rhinitis: Secondary | ICD-10-CM

## 2018-09-04 DIAGNOSIS — Z21 Asymptomatic human immunodeficiency virus [HIV] infection status: Secondary | ICD-10-CM | POA: Diagnosis not present

## 2018-09-04 DIAGNOSIS — G43909 Migraine, unspecified, not intractable, without status migrainosus: Secondary | ICD-10-CM | POA: Diagnosis not present

## 2018-09-04 DIAGNOSIS — Z79899 Other long term (current) drug therapy: Secondary | ICD-10-CM

## 2018-09-04 MED ORDER — BENZONATATE 100 MG PO CAPS: 100.0000 mg | ORAL_CAPSULE | Freq: Three times a day (TID) | ORAL | 1 refills | Status: DC | PRN

## 2018-09-04 NOTE — Assessment & Plan Note (Signed)
Patient reports for the past 2 weeks she has been having a cough that has now become productive with some yellowish colored sputum (she reports that the pollen is yellow), and shortness of breath and some mild chest pain when she has having the cough, she also endorses a runny nose, teary eyes, and sinus headaches.  She reports that she gets allergies around this time every year but is concerned because this appears to be worse than normal.  She saw her allergist, Dr. Neldon Mc, earlier this month however she was not having symptoms at that time.  She reports that she recently had a tree cut down in her backyard and states that her symptoms always seem to worsen whenever she goes outside.  She is using an inhaler, Singulair, Zyrtec, and Nasacort which she reports helped a little bit but she still having the cough.  She denies any fevers, chills, wheezing, any recent travel or sick contacts.  She has been staying at home and practicing a social distancing since the [place] pandemic started.  She was found to be afebrile, oxygenating well, and on exam her lungs are clear to auscultation, had a normal work of breathing, had intermittent coughing, oropharyngeal exam was unremarkable, she had no lymphadenopathy.  Patient does have a significant past medical history including HIV, Addison's disease, hypertension, and migraines.  However given her presentation and exam findings this does not appear to be consistent with a COVID infection, this appears to be more consistent with seasonal allergies, possibly made worse by the increase in pollen and recent yardwork that she had done.  -We will send in [place] for cough -Advised patient to try switching from Zyrtec to Allegra over-the-counter for a few days to see if that may help, and to discuss this with Dr. Neldon Mc if she continues this -Curator -Continue following with Dr. Neldon Mc

## 2018-09-04 NOTE — Patient Instructions (Signed)
Ms. AMELIE HOLLARS,  It was a pleasure to see you today. Thank you for coming in.   Today we discussed your coughing.  It appears that this is most likely related to some allergies given your history of seasonal allergies.  I will send in a prescription for Tessalon Perles to your pharmacy.  You may consider switching over from Zyrtec to Allegra over-the-counter for a few days to see if that helps, I would discuss this with your allergist Dr. Tina Griffiths.   With your presentation and exam findings this does not appear to be a viral cause such as COVID, however we cannot be sure so please continue to practice social distancing and self isolating.  And if you start to develop a fever or have worsening shortness of breath please do not hesitate to contact us.   Please return to clinic in 3 months or sooner if needed.   Thank you again for coming in.   Asencion Noble.D.

## 2018-09-04 NOTE — Progress Notes (Signed)
   CC: Cough  HPI:  [name] is a 58 y.o.   Past Medical History:  Diagnosis Date  . Allergic rhinitis 05/09/2006  . Allergy   . Anxiety   . Arthritis   . Asthma   . CHF (congestive heart failure) ([place])   . Chronic back pain   . Diabetes mellitus without complication ([place])   . GERD (gastroesophageal reflux disease)   . Heart murmur    as a child  . 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 s/p 12 week course of simeprevir and sofosbuvir between October 2014 and January 2015 with resolution.  Marland Kitchen History of shingles   . HIV infection ([place])    1994  . Hyperlipidemia    no meds taken now  . Hypertension   . Migraine   . Pituitary microadenoma ([place]) 08/08/2014  . Prediabetes   . Refusal of blood transfusions as patient is Jehovah's Witness   . Secondary adrenal insufficiency ([place]) 06/29/2014  . Urticaria    Review of Systems: Ports productive cough, shortness of breath, chest pain, headaches, rhinorrhea, dysuria.  Denies fevers, chills, lightheadedness, dizziness, wheezing, or abdominal pain.  Physical Exam:  Vitals:   09/04/18 1341  BP: (!) 154/83  Pulse: 86  Temp: 98.1 F (36.7 C)  TempSrc: Oral  SpO2: 100%   Physical Exam  Constitutional: She is oriented to person, place, and time.  Well-developed, intermittent coughing, no acute distress  HENT:  Head: Normocephalic and atraumatic.  Mouth/Throat: Oropharynx is clear and moist. No oropharyngeal exudate.  Eyes: Pupils are equal, round, and reactive to light. Conjunctivae and EOM are normal.  Cardiovascular: Normal rate and regular rhythm.  Pulmonary/Chest: Effort normal and breath sounds normal. No respiratory distress. She has no wheezes.  Intermittent coughing, lungs CTA  Neurological: She is alert and oriented to person, place, and time.  Skin: Skin is warm and dry.     Assessment & Plan:   See Encounters Tab for problem based charting.  Patient discussed with Dr.  Daryll Drown

## 2018-09-04 NOTE — Assessment & Plan Note (Signed)
Patient is currently on metoprolol 25 mg twice daily and amlodipine 10 mg daily.  Goal blood pressure is less than 140/80, blood pressure is elevated today to 154/83.  However patient reports that this is due to her coughing and her stress.  She states that she normally checks blood pressures at home and it ranges around 130/80.   She denies any issues taking her medications. We will recheck her blood pressure when this acute issue has resolved.   When: -Continue metoprolol 25 mg twice daily and amlodipine 10 mg daily -Consider increasing metoprolol if blood pressure still elevated on next visit

## 2018-09-05 ENCOUNTER — Other Ambulatory Visit: Payer: Self-pay | Admitting: Internal Medicine

## 2018-09-05 ENCOUNTER — Telehealth: Payer: Self-pay | Admitting: *Deleted

## 2018-09-05 NOTE — Telephone Encounter (Signed)
Call [facility] to check on patient's prescription for Tessalon Pearls.  Patient had called earlier that prescription had not been sent in for  Pharmacy has prescription ready for patient and will deliver on tomorrow.  Patient was called and notified that the pharmacy will deliver the [facility] on tomorrow.  Patient voiced understanding of .  Sander Nephew, RN 09/05/2018 4:12 PM.

## 2018-09-05 NOTE — Telephone Encounter (Signed)
Pt seen on 09/04/2018 and wants to know why she can't pick up her benzonatate (TESSALON PERLES) 100 MG capsule medication.  Pt  Can't stop coughing and can't rest.  Pt would lieka call back as soon as possible.  Pt uses the [facility], [place]

## 2018-09-05 NOTE — Addendum Note (Signed)
Addended by: Gilles Chiquito B on: 09/05/2018 08:55 AM   Modules accepted: Level of Service

## 2018-09-05 NOTE — Progress Notes (Signed)
Internal Medicine Clinic Attending  Case discussed with Dr. Krienke at the time of the visit.  We reviewed the resident's history and exam and pertinent patient test results.  I agree with the assessment, diagnosis, and plan of care documented in the resident's note.    

## 2018-09-05 NOTE — Telephone Encounter (Signed)
Insurance refused payment, the pharmacist did an override and it will be $10.00, pt informed

## 2018-09-07 ENCOUNTER — Encounter: Payer: Medicaid Other | Admitting: Physical Medicine & Rehabilitation

## 2018-09-10 LAB — COMPREHENSIVE METABOLIC PANEL
AG Ratio: 1.4 (calc) (ref 1.0–2.5)
ALT: 20 U/L (ref 6–29)
AST: 17 U/L (ref 10–35)
Albumin: 4.3 g/dL (ref 3.6–5.1)
Alkaline phosphatase (APISO): 74 U/L (ref 37–153)
BUN: 12 mg/dL (ref 7–25)
CO2: 24 mmol/L (ref 20–32)
Calcium: 9 mg/dL (ref 8.6–10.4)
Chloride: 107 mmol/L (ref 98–110)
Creat: 1 mg/dL (ref 0.50–1.05)
Globulin: 3.1 g/dL (calc) (ref 1.9–3.7)
Glucose, Bld: 131 mg/dL — ABNORMAL HIGH (ref 65–99)
Potassium: 3.8 mmol/L (ref 3.5–5.3)
Sodium: 140 mmol/L (ref 135–146)
Total Bilirubin: 0.4 mg/dL (ref 0.2–1.2)
Total Protein: 7.4 g/dL (ref 6.1–8.1)

## 2018-09-10 LAB — CBC
HCT: 44.6 % (ref 35.0–45.0)
Hemoglobin: 14.7 g/dL (ref 11.7–15.5)
MCH: 27 pg (ref 27.0–33.0)
MCHC: 33 g/dL (ref 32.0–36.0)
MCV: 82 fL (ref 80.0–100.0)
MPV: 11.2 fL (ref 7.5–12.5)
Platelets: 244 10*3/uL (ref 140–400)
RBC: 5.44 10*6/uL — ABNORMAL HIGH (ref 3.80–5.10)
RDW: 15.3 % — ABNORMAL HIGH (ref 11.0–15.0)
WBC: 7.6 10*3/uL (ref 3.8–10.8)

## 2018-09-10 LAB — LIPID PANEL
Cholesterol: 141 mg/dL (ref <200)
HDL: 32 mg/dL — ABNORMAL LOW (ref >=50)
LDL Cholesterol (Calc): 79 mg/dL (calc)
Non-HDL Cholesterol (Calc): 109 mg/dL (calc) (ref <130)
Total CHOL/HDL Ratio: 4.4 (calc) (ref <5.0)
Triglycerides: 208 mg/dL — ABNORMAL HIGH (ref <150)

## 2018-09-10 LAB — RPR: RPR Ser Ql: NONREACTIVE

## 2018-09-10 LAB — HIV-1 RNA QUANT-NO REFLEX-BLD
HIV 1 RNA Quant: <20 copies/mL — AB
HIV-1 RNA Quant, Log: <1.3 Log copies/mL — AB

## 2018-09-11 ENCOUNTER — Other Ambulatory Visit: Payer: Self-pay | Admitting: Internal Medicine

## 2018-09-11 NOTE — Telephone Encounter (Signed)
metoprolol tartrate (LOPRESSOR) 25 MG tablet, refill request @ [facility], [address] [phone] (Phone) [phone] (Fax)

## 2018-09-12 ENCOUNTER — Other Ambulatory Visit: Payer: Self-pay | Admitting: *Deleted

## 2018-09-12 NOTE — Telephone Encounter (Signed)
Call from pt asking about metoprolol and zyrtec refills. Informed Zyrtec has been refilled ( I will call the pharmacy) and i will re-send  Metoprolol request to her doctor.

## 2018-09-13 ENCOUNTER — Encounter: Payer: Medicaid Other | Admitting: Infectious Diseases

## 2018-09-14 DIAGNOSIS — H40033 Anatomical narrow angle, bilateral: Secondary | ICD-10-CM | POA: Diagnosis not present

## 2018-09-14 DIAGNOSIS — H40023 Open angle with borderline findings, high risk, bilateral: Secondary | ICD-10-CM | POA: Diagnosis not present

## 2018-09-14 DIAGNOSIS — D443 Neoplasm of uncertain behavior of pituitary gland: Secondary | ICD-10-CM | POA: Diagnosis not present

## 2018-09-19 ENCOUNTER — Telehealth: Payer: Self-pay | Admitting: Infectious Diseases

## 2018-09-19 ENCOUNTER — Telehealth: Payer: Self-pay | Admitting: Neurology

## 2018-09-19 NOTE — Telephone Encounter (Signed)
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 Kathlen Mody think that she has side effect from topamax.  IOP 13/17, no glaucoma change OU

## 2018-09-19 NOTE — Telephone Encounter (Signed)
Pt has called to inform that Dr Kathlen Mody @ [facility] sent a request to Dr Krista Blue to have pt taken off of Topomax because of vision changes.  Pt states she does not want to continue on this medication either because of concerns for her vision.  Phone rep reached out to RN for Dr Krista Blue and was told about an opening for 05-28 for pt to have a virtual visit.  Pt gave consent to file insurance for the [email or web address] for 05-28 but phone rep was unable to find an open time slot at 9am for 05-28 as mentioned by RN.  Please call pt re: available appointment for 05-28 at 9am.  Pt did confirm her email address to be [email or web address] for the link to be sent to

## 2018-09-19 NOTE — Telephone Encounter (Signed)
COVID-19 Pre-Screening Questions: ° °Do you currently have a fever (>100 °F), chills or unexplained body aches? No  ° °Are you currently experiencing new cough, shortness of breath, sore throat, runny nose? No   °•  °Have you recently travelled outside the state of [place] in the last 14 days? No   °•  °1. Have you been in contact with someone that is currently pending confirmation of Covid19 testing or has been confirmed to have the Covid19 virus?  no ° °

## 2018-09-20 ENCOUNTER — Encounter: Payer: Self-pay | Admitting: Infectious Diseases

## 2018-09-20 ENCOUNTER — Ambulatory Visit (INDEPENDENT_AMBULATORY_CARE_PROVIDER_SITE_OTHER): Payer: Medicaid Other | Admitting: Infectious Diseases

## 2018-09-20 ENCOUNTER — Ambulatory Visit
Admission: RE | Admit: 2018-09-20 | Discharge: 2018-09-20 | Disposition: A | Discharge status: Home / Self Care | Payer: Medicaid Other | Source: Ambulatory Visit | Attending: Infectious Diseases | Admitting: Infectious Diseases

## 2018-09-20 ENCOUNTER — Other Ambulatory Visit: Payer: Self-pay

## 2018-09-20 VITALS — BP 146/87 | HR 81 | Temp 98.5°F | Wt 172.0 lb

## 2018-09-20 DIAGNOSIS — R059 Cough, unspecified: Secondary | ICD-10-CM

## 2018-09-20 DIAGNOSIS — R05 Cough: Secondary | ICD-10-CM | POA: Diagnosis not present

## 2018-09-20 DIAGNOSIS — B20 Human immunodeficiency virus [HIV] disease: Secondary | ICD-10-CM | POA: Diagnosis not present

## 2018-09-20 DIAGNOSIS — Z8619 Personal history of other infectious and parasitic diseases: Secondary | ICD-10-CM

## 2018-09-20 DIAGNOSIS — R0602 Shortness of breath: Secondary | ICD-10-CM | POA: Diagnosis not present

## 2018-09-20 MED ORDER — PROMETHAZINE-DM 6.25-15 MG/5ML PO SYRP: 5.0000 mL | ORAL_SOLUTION | Freq: Four times a day (QID) | ORAL | 1 refills | Status: DC | PRN

## 2018-09-20 NOTE — Patient Instructions (Addendum)
I am sorry to see that you are having trouble with your cough.  I do think this is slowly getting better and anticipate this is likely due to a viral infection you may have encountered or alternatively we see chronic cough in the setting of allergy exposure or gastric reflux.  Please continue your allergy medication as you are taking.  It may be worth a try to increase your reflux medicine to twice a day to see if this helps.  I do not suspect you have any pneumonia based on your exam but with how long this is been going on I would like to check a chest x-ray for you.  I do not suspect that you had COVID 19 but if you did you are at the tail end and I would anticipate you continuing to get better.  Honey lemon tea may be soothing for you to help with your cough. I will send in a prescription for a cough medicine that I am hopeful will offer you some rest at night.  You can take it during the day just be mindful that it can make you sleepy if you do so.    You can take 2.5 to 5 mLs up to 4 times a day.  I provided you a refill with this if you find it to be helpful.  In the meantime continue to eat healthy nutritious foods, get plenty of rest and sleep, drink plenty of water.  Please continue your Biktarvy once a day as you are taking.  Your lab work looks excellent.  Your next Pap smear is due in September 2022  You are due back to see Dr. Johnnye Sima in 6 months with labs prior to your visit.    Cough, Adult  A cough helps to clear your throat and lungs. A cough may last only 2-3 weeks (acute), or it may last longer than 8 weeks (chronic). Many different things can cause a cough. A cough may be a sign of an illness or another medical condition. Follow these instructions at home:  Pay attention to any changes in your cough.  Take medicines only as told by your doctor. ? If you were prescribed an antibiotic medicine, take it as told by your doctor. Do not stop taking it even if you start  to feel better. ? Talk with your doctor before you try using a cough medicine.  Drink enough fluid to keep your pee (urine) clear or pale yellow.  If the air is dry, use a cold steam vaporizer or humidifier in your home.  Stay away from things that make you cough at work or at home.  If your cough is worse at night, try using extra pillows to raise your head up higher while you sleep.  Do not smoke, and try not to be around smoke. If you need help quitting, ask your doctor.  Do not have caffeine.  Do not drink alcohol.  Rest as needed. Contact a doctor if:  You have new problems (symptoms).  You cough up yellow fluid (pus).  Your cough does not get better after 2-3 weeks, or your cough gets worse.  Medicine does not help your cough and you are not sleeping well.  You have pain that gets worse or pain that is not helped with medicine.  You have a fever.  You are losing weight and you do not know why.  You have night sweats. Get help right away if:  You cough up blood.  You have trouble breathing.  Your heartbeat is very fast. This information is not intended to replace advice given to you by your health care provider. Make sure you discuss any questions you have with your health care provider. Document Released: 12/23/2010 Document Revised: 09/17/2015 Document Reviewed: 06/18/2014 Elsevier Interactive Patient Education  2019 [employer].

## 2018-09-20 NOTE — Assessment & Plan Note (Signed)
She is doing very well on her Biktarvy.  Seems to be tolerating this quite nicely compared to previous regimens.  Her viral load remains undetectable with healthy CD4 count over 500. We will provide her with refills and have her follow-up with Dr. Johnnye Sima in 6 months.  She is not due for any vaccines at this time but will require flu shot in the fall. Not currently sexually active.  STI testing deferred. Her next Pap smear is due in September 2022 for every 3 year lifelong screenings.

## 2018-09-20 NOTE — Assessment & Plan Note (Signed)
She was previously recommended from her provider at [facility] to undergo annual abdominal ultrasounds.  She is not due for this again until after August.  Her initial fibrosis staging was low to moderate at [facility] based off of biopsy.  For someone without HIV not typically recommended for [facility] screenings without severe fibrosis/cirrhosis findings; however considering coinfection with HIV may be in her best interest to continue annual screenings.

## 2018-09-20 NOTE — Progress Notes (Signed)
Subjective:    [name], female    DOB: [birth year], 58 y.o.   MRN: [id number]  CC: HIV follow-up care. Cough x2.5 weeks - slowly improving.   HPI:  58yo F with hx of HIV (prev Lexiva-norvir/Truvada; isentress/truvada caused rashes, prezcobix/truvada - rash) and previously cured Hep C (tx at [facility] completed 04-2013, repeat Hep C RNA -).  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  Today her primary concern is a cough that has persisted for nearly 3 weeks now.  She says that the cough onset was abrupt.  Initially described to have have been productive of phlegm with wheezing however now she describes this to be more dry and hacking throughout the day but notably worse at night.  She does describe that her symptoms have improved slightly since onset but is concerned considering the duration.  She sought care for this condition a few times with her primary care provider.  She was denied COVID testing due to lack of fever from what she tells me.  She has not had any development of any fever, chills or night sweats during the course of her illness or any other associated symptoms.  She was given Ladona Ridgel which have offered her no benefit.  She does report posttussive emesis from time to time and feels that it is impacting her health and her weight.  She has tried multiple attempts at allergy medications as she feels it may be allergy related.  She does have not only outdoor allergies but indoor allergies to dust mites.  She has been spending a lot of time indoors due to coronavirus pandemic quarantine.  She has a lot of carpets in the home that she rents.  She is spent essentially no time outside aside from going to the grocery store and doctors visits.  She has to sit upright at night to sleep to control her acid reflux.  She previously was taking Zantac and is now taking  Pepcid every evening.  She feels that her chronic fatigue has been significantly worsened during this acute illness.  She controls her diabetes with diet.  She does not currently smoke but is a previous smoker of many years.  Review of Systems  Constitutional: Positive for fatigue. Negative for appetite change and unexpected weight change.  Respiratory: Positive for cough. Negative for shortness of breath and wheezing.   Cardiovascular: Positive for chest pain. Negative for leg swelling.  Gastrointestinal: Positive for vomiting (in the setting of coughing ).  Musculoskeletal: Positive for arthralgias and back pain.  Skin: Negative for rash.  Please see HPI. All other systems reviewed and negative.      Objective:   Physical Exam Constitutional:      Appearance: She is well-developed.     Comments: Seated comfortably in the chair.  Mask on.  Coughing throughout visit.  No respiratory distress.  Very pleasant.  HENT:     Mouth/Throat:     Mouth: Mucous membranes are moist.     Pharynx: No oropharyngeal exudate.  Eyes:     General: No scleral icterus.    Pupils: Pupils are equal, round, and reactive to light.  Neck:     Musculoskeletal: Normal range of motion and neck supple.  Cardiovascular:     Rate and Rhythm: Normal rate and regular rhythm.     Heart sounds: Normal heart sounds.  Pulmonary:  Effort: Pulmonary effort is normal. No respiratory distress.     Breath sounds: No wheezing or rales.     Comments: Slightly diminished on the right base, however she has shallow inspiration due to reactive coughing. Abdominal:     General: Bowel sounds are normal. There is distension.     Palpations: Abdomen is soft.     Tenderness: There is no abdominal tenderness.  Skin:    General: Skin is warm and dry.     Capillary Refill: Capillary refill takes less than 2 seconds.  Neurological:     Mental Status: She is oriented to person, place, and time.       Assessment & Plan:    Problem List Items Addressed This Visit      Unprioritized   Cough    Is not clear as to what is causing her lingering cough.  Considering the abrupt onset I suspect this was due to to a viral illness.  Although she has risk factors for other causes such as significant contributor of allergies as well as acid reflux and potentially even aspiration. Her lung assessment is fairly benign aside from some decreased air movement in the right base.  She has no wheezing or rails from what I can detect.  Considering her previous smoking history and duration of cough we will proceed with a chest x-ray to evaluate. I will give her some Promethazine DM to see if this helps offer some rest at night.  Cautioned that this will cause drowsiness recommended 2-1/2 to 5 mL every 6 hours. I suggested she could trial her Pepcid twice a day to see if this offers any benefit once before breakfast and once before dinner. Cardiac assessment is benign and has no other signs of fluid retention from exam. Low suspicion for COVID and likely no benefit to test this far out from symptom onset.  She lives by herself and stays isolated anyway.  We will call her with chest x-ray results.  Otherwise symptomatic management for now.       Relevant Medications   promethazine-dextromethorphan (PROMETHAZINE-DM) 6.25-15 MG/5ML syrup   Other Relevant Orders   DG Chest 2 View   Hepatitis C virus infection cured after antiviral drug therapy    She was previously recommended from her provider at [facility] to undergo annual abdominal ultrasounds.  She is not due for this again until after August.  Her initial fibrosis staging was low to moderate at [facility] based off of biopsy.  For someone without HIV not typically recommended for [facility] screenings without severe fibrosis/cirrhosis findings; however considering coinfection with HIV may be in her best interest to continue annual screenings.      HIV disease ([place]) - Primary (Chronic)    She  is doing very well on her [place].  Seems to be tolerating this quite nicely compared to previous regimens.  Her viral load remains undetectable with healthy CD4 count over 500. We will provide her with refills and have her follow-up with Dr. Johnnye Sima in 6 months.  She is not due for any vaccines at this time but will require flu shot in the fall. Not currently sexually active.  STI testing deferred. Her next Pap smear is due in September 2022 for every 3 year lifelong screenings.      Relevant Orders   HIV-1 RNA quant-no reflex-bld   T-helper cell (CD4)- ([facility] clinic only)     [name], MSN, NP-[facility] [place] [facility] [place]  [email or web address] Pager: [phone] Office: [place]: [phone]

## 2018-09-20 NOTE — Assessment & Plan Note (Signed)
Is not clear as to what is causing her lingering cough.  Considering the abrupt onset I suspect this was due to to a viral illness.  Although she has risk factors for other causes such as significant contributor of allergies as well as acid reflux and potentially even aspiration. Her lung assessment is fairly benign aside from some decreased air movement in the right base.  She has no wheezing or rails from what I can detect.  Considering her previous smoking history and duration of cough we will proceed with a chest x-ray to evaluate. I will give her some Promethazine DM to see if this helps offer some rest at night.  Cautioned that this will cause drowsiness recommended 2-1/2 to 5 mL every 6 hours. I suggested she could trial her Pepcid twice a day to see if this offers any benefit once before breakfast and once before dinner. Cardiac assessment is benign and has no other signs of fluid retention from exam. Low suspicion for COVID and likely no benefit to test this far out from symptom onset.  She lives by herself and stays isolated anyway.  We will call her with chest x-ray results.  Otherwise symptomatic management for now.

## 2018-09-21 ENCOUNTER — Encounter: Payer: Medicaid Other | Admitting: Infectious Diseases

## 2018-09-24 ENCOUNTER — Encounter: Payer: Self-pay | Admitting: Neurology

## 2018-09-24 ENCOUNTER — Other Ambulatory Visit: Payer: Self-pay

## 2018-09-24 ENCOUNTER — Ambulatory Visit (INDEPENDENT_AMBULATORY_CARE_PROVIDER_SITE_OTHER): Payer: Medicaid Other | Admitting: Neurology

## 2018-09-24 DIAGNOSIS — D352 Benign neoplasm of pituitary gland: Secondary | ICD-10-CM

## 2018-09-24 DIAGNOSIS — G43709 Chronic migraine without aura, not intractable, without status migrainosus: Secondary | ICD-10-CM

## 2018-09-24 DIAGNOSIS — IMO0002 Reserved for concepts with insufficient information to code with codable children: Secondary | ICD-10-CM

## 2018-09-24 MED ORDER — NORTRIPTYLINE HCL 10 MG PO CAPS: 20.0000 mg | ORAL_CAPSULE | Freq: Every day | ORAL | 11 refills | Status: DC

## 2018-09-24 NOTE — Progress Notes (Signed)
Virtual Visit via Telephone Note  I connected with [name] [name] on 09/24/18 at 11:00 AM EDT by telephone and verified that I am speaking with the correct person using two identifiers.   I discussed the limitations, risks, security and privacy concerns of performing an evaluation and management service by telephone and the availability of in person appointments. I also discussed with the patient that there may be a patient responsible charge related to this service. The patient expressed understanding and agreed to proceed.   History of Present Illness: [name] [name] is a 58 year old female, seen in request by her primary care physician Dr. Heber , Elza Rafter, for evaluation of migraine headache, initial evaluation was on June 25, 2018.  I have reviewed and summarized the referring note from the referring physician.  She had a past medical history of hyperlipidemia, allergy.  She has a history of pituitary adenoma, under close supervision of endocrinologist Dr. 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  She also have a history of HIV, hepatitis C that was treated, Addison's disease, on high dose of hydrocortisone treatment, steroid induced diabetes,  She reported history of migraine headache when she was young, her typical migraine are bilateral temporal, retro-orbital area severe pressure headache with light noise sensitivity, nauseous, prefer to lie down in dark quiet room.  In October 2019, she began to have different kind of headaches, this started with dizziness, few things were spinning around, worsening with sudden positional movement, no hearing loss, no tinnitus, later she developed this constant bilateral temporal frontal area pressure headaches,  difficulty focusing, once a week, it would exacerbated to a more severe headache with light noise sensitivity, nauseous, eating better lying down sleep.  Update Aug 27, 2018 SS: CMP, sed rate in March 2020 was unremarkable, MRI of the brain in March 2020, no change from 2018  She says that her last visit with Dr. Krista Blue, she was started on Topamax 50 mg twice a day.  She reports the Topamax has been very beneficial for her headaches.  Since starting the Topamax, she is having about 2 headaches a week, but are not bad enough to take any medication for relief.  She has not taken the Imitrex.  She reports a cough, allergies.  She reports if she did not have allergies or cough, she thinks she would not have a headache.  She also takes Valium for chronic insomnia.  She reports overall she is doing much better with the addition of Topamax.  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  She denies any new problems or concerns.  Virtual Visit via Video  I connected with [name] [name] on 09/24/18 at  by Video and verified that I am speaking with the correct person using two identifiers.   I discussed the limitations, risks, security and privacy concerns of performing an evaluation and management service by video and the availability of in person appointments. I also discussed with the patient that there may be a patient responsible charge related to this service. The patient expressed understanding and agreed to proceed.   History of Present Illness: She started Topamax 50mg  bid few months ago, which has helped her migraine, but it has affected her vision, she saw flash light  in her eye, also complains of difficulty focusing, difficulty reading.    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 Kathlen Mody think that she has side effect from topamax.  IOP 13/17, no glaucoma change OU  Topamax has helped her migraine, Imitrex 25 mg as needed was helpful to    Observations/Objective: I have reviewed problem lists, medications, allergies.  Awake, alert, oriented to history taking, and casual conversation, she ambulate without difficulties  Assessment and Plan: MRI of the brain 07/03/2018 IMPRESSION:   MRI brain and pituitary (with and without) demonstrating: - Subtle hypoenhancing lesion noted in the left pituitary gland, measuring 3 mm. - Few periventricular and subcortical foci of non-specific T2 hyperintensities. - No acute findings. No change from MRI on 06/24/16.   Assessment and Plan: Chronic daily headaches Pituitary microadenoma  Could not tolerate Topamax due to side effect to her eyes.  Start nortriptyline 10 mg, titrating to 20 mg every night as preventive measures  Imitrex 25 mg as needed    Follow Up Instructions:   In 6 months   I discussed the assessment and treatment plan with the patient. The patient was provided an opportunity to ask questions and all were answered. The patient agreed with the plan and demonstrated an understanding of the instructions.   The patient was advised to call back or seek an in-person evaluation if the symptoms worsen or if the condition fails to improve as anticipated.  I provided 30 minutes of non-face-to-face time during this encounter.   Marcial Pacas, MD

## 2018-10-19 ENCOUNTER — Other Ambulatory Visit: Payer: Self-pay

## 2018-10-19 ENCOUNTER — Encounter: Payer: Medicaid Other | Attending: Physical Medicine & Rehabilitation | Admitting: Physical Medicine & Rehabilitation

## 2018-10-19 DIAGNOSIS — M533 Sacrococcygeal disorders, not elsewhere classified: Secondary | ICD-10-CM | POA: Insufficient documentation

## 2018-10-19 NOTE — Patient Instructions (Signed)
Sacroiliac injection was performed today. A combination of a naming medicine plus a cortisone medicine was injected. The injection was done under x-ray guidance. This procedure has been performed to help reduce low back and buttocks pain as well as potentially hip pain. The duration of this injection is variable lasting from hours to  Months. It may repeated if needed. 

## 2018-10-19 NOTE — Progress Notes (Signed)
  PROCEDURE RECORD  Physical Medicine and Rehabilitation   Name: [name] [name] DOB:[birth year] MRN: [id number]  Date:10/19/2018  Physician: Alysia Penna, MD    Nurse/CMA: Betti Cruz  Allergies:  Allergies  Allergen Reactions  . Acetaminophen Other (See Comments)    Inflamed liver, hospitalized REACTION: Had liver problems, was hospitalized Inflamed liver, hospitalized  . Morphine Hives and Rash    REACTION: rash  . Triamterene Hives  . Dyazide [Hydrochlorothiazide W-Triamterene] Hives  . Lisinopril     Cough   . Losartan Rash    Pt had rash, worsening dizziness, and nausea after starting losartan, which improved after stopping losartan    Consent Signed: Yes.    Is patient diabetic? Yes.    CBG today?   Pregnant: No. LMP: No LMP recorded. Patient is postmenopausal. (age 60-55)  Anticoagulants: no Anti-inflammatory: no Antibiotics: no  Procedure: Bilateral Sacroiliac Joint Injection Position: Prone Start Time: 10:48am End Time: 10:54am Fluoro Time: 80  RN/CMA [name] [name], CMA [name] [name], CMA    Time 10:24am 10:58am    BP 121/84 143/93    Pulse 86 92    Respirations 16 16    O2 Sat 97 98    S/S 6/6 6/6    Pain Level 10/10 7/10     D/C home with sister in law [place], [name] & O X 3, D/C instructions reviewed, and sits independently.

## 2018-10-19 NOTE — Progress Notes (Signed)
Bilteral  sacroiliac injection under fluoroscopic guidance  Indication: Bilateral Low back and buttocks pain not relieved by medication management and other conservative care.  Informed consent was obtained after describing risks and benefits of the procedure with the patient, this includes bleeding, bruising, infection, paralysis and medication side effects. The patient wishes to proceed and has given written consent. The patient was placed in a prone position. The lumbar and sacral area was marked and prepped with Betadine. A 25-gauge 1-1/2 inch needle was inserted into the skin and subcutaneous tissue and 1 mL of 1% lidocaine was injected. Then a 25-gauge 3 inch spinal needle was inserted under fluoroscopic guidance into the left sacroiliac joint. AP and lateral images were utilized. Isovue 200x0.5 mL under live fluoroscopy demonstrated no intravascular uptake. Then a solution containing one ML of 6 mg per mLbetamethasone and 2 ML of 2% lidocaine MPF was injected x1.5 mL.Same procedure performed on the right with same technique and equipment.  Patient tolerated the procedure well. Post procedure instructions were given. Please see post procedure form.

## 2018-10-22 ENCOUNTER — Encounter: Payer: Self-pay | Admitting: *Deleted

## 2018-10-30 ENCOUNTER — Other Ambulatory Visit: Payer: Self-pay

## 2018-10-30 ENCOUNTER — Ambulatory Visit (INDEPENDENT_AMBULATORY_CARE_PROVIDER_SITE_OTHER): Payer: Medicaid Other | Admitting: Allergy and Immunology

## 2018-10-30 ENCOUNTER — Encounter: Payer: Self-pay | Admitting: Allergy and Immunology

## 2018-10-30 VITALS — BP 132/84 | HR 86 | Temp 98.3°F | Resp 16

## 2018-10-30 DIAGNOSIS — L299 Pruritus, unspecified: Secondary | ICD-10-CM

## 2018-10-30 DIAGNOSIS — K219 Gastro-esophageal reflux disease without esophagitis: Secondary | ICD-10-CM

## 2018-10-30 DIAGNOSIS — J4530 Mild persistent asthma, uncomplicated: Secondary | ICD-10-CM | POA: Diagnosis not present

## 2018-10-30 DIAGNOSIS — J3089 Other allergic rhinitis: Secondary | ICD-10-CM

## 2018-10-30 NOTE — Progress Notes (Signed)
[facility]   Follow-up Note  Referring Provider: Kalman Shan Ratlif* Primary Provider: Sid Falcon, MD Date of Office Visit: 10/30/2018  Subjective:   [name] [name] (DOB: [birth year]) is a 58 y.o. female who returns to the [facility] on 10/30/2018 in re-evaluation of the following:  HPI: [name] returns to this clinic in evaluation of asthma and allergic rhinoconjunctivitis and LPR and history of pruritus.  Her last visit to this clinic was 31 July 2018.  Although [name] was doing quite well since her last visit she did have an episode of coughing in May that lasted about 3 weeks and was evaluated with chest x-ray which apparently was normal and she was given a cough suppressant.  Fortunately, that issue appears to have completely resolved.  Right now she is doing very well with her breathing and does not use a short acting bronchodilator.  Currently she is using her Pulmicort about 3 times a week as well as consistent use of montelukast.  Likewise she has had very little issues with her nose or eyes at this point.  Currently she is using her Nasacort a few times a week.  Her reflux is under excellent control while using a combination of a proton pump inhibitor and H2 receptor blocker..  Her pruritus is under pretty good control while using a dose of cetirizine on a consistent basis.  She has been having a vision problem ever since she started Topamax and this agent was discontinued about 4 weeks ago when she had evaluation by her ophthalmologist and is to have a repeat evaluation sometime in the next week or 2 regarding this issue.  Allergies as of 10/30/2018      Reactions   Acetaminophen Other (See Comments)   Inflamed liver, hospitalized REACTION: Had liver problems, was hospitalized Inflamed liver, hospitalized   Morphine Hives, Rash   REACTION: rash   Triamterene Hives   Dyazide [hydrochlorothiazide  W-triamterene] Hives   Lisinopril    Cough    Losartan Rash   Pt had rash, worsening dizziness, and nausea after starting losartan, which improved after stopping losartan      Medication List      amLODipine 10 MG tablet Commonly known as: NORVASC TAKE 1 TABLET (10 MG TOTAL) BY MOUTH DAILY.   atorvastatin 10 MG tablet Commonly known as: LIPITOR Take 1 tablet (10 mg total) by mouth daily.   benzonatate 100 MG capsule Commonly known as: Tessalon Perles Take 1 capsule (100 mg total) by mouth 3 (three) times daily as needed for cough.   Biktarvy 50-200-25 MG Tabs tablet Generic drug: bictegravir-emtricitabine-tenofovir AF TAKE ONE TABLET BY MOUTH DAILY   budesonide 180 MCG/ACT inhaler Commonly known as: Pulmicort Flexhaler Inhale 2 puffs into the lungs daily. Rinse, gargle, and spit after use.   cetirizine 10 MG tablet Commonly known as: ZYRTEC TAKE 1 TABLET BY MOUTH EVERY DAY AS NEEDED FOR ALLERGIES   diazepam 5 MG tablet Commonly known as: VALIUM TAKE ONE TABLET BY MOUTH AT BEDTIME AS NEEDED FOR ANXIETY   diazepam 5 MG tablet Commonly known as: Valium Take 1-2 tablets 30 minutes prior to MRI, may repeat once as needed. Must have driver.   diphenhydrAMINE 25 MG tablet Commonly known as: Diphenhist TAKE 1 tablet AS NEEDED FOR ITCHING.   famotidine 40 MG tablet Commonly known as: PEPCID Take 1 tablet (40 mg total) by mouth at bedtime.   hydrocortisone 10 MG tablet  Commonly known as: CORTEF Take 10 mg by mouth as directed. Pt takes 10 mg in the am and 5 mg at 11 am and 3 pm   liraglutide 18 MG/3ML Sopn Commonly known as: VICTOZA Inject into the skin.   metoprolol tartrate 25 MG tablet Commonly known as: LOPRESSOR TAKE ONE TABLET BY MOUTH TWICE A DAY   montelukast 10 MG tablet Commonly known as: SINGULAIR TAKE 1 TABLET (10 MG TOTAL) BY MOUTH DAILY.   nitroGLYCERIN 0.4 MG SL tablet Commonly known as: NITROSTAT Place 1 tablet (0.4 mg total) under the tongue  every 5 (five) minutes as needed for chest pain.   nortriptyline 10 MG capsule Commonly known as: PAMELOR Take 2 capsules (20 mg total) by mouth at bedtime.   pantoprazole 40 MG tablet Commonly known as: PROTONIX TAKE ONE TABLET BY MOUTH DAILY   ProAir HFA 108 (90 Base) MCG/ACT inhaler Generic drug: albuterol INHALE TWO PUFFS BY MOUTH EVERY FOUR TO SIX HOURS AS NEEDED FOR COUGH OR WHEEZE   promethazine 25 MG tablet Commonly known as: PHENERGAN Take 1 tablet (25 mg total) by mouth every 6 (six) hours as needed for nausea or vomiting.   promethazine-dextromethorphan 6.25-15 MG/5ML syrup Commonly known as: PROMETHAZINE-DM Take 5 mLs by mouth 4 (four) times daily as needed for cough.   ranitidine 300 MG tablet Commonly known as: ZANTAC TAKE ONE TABLET BY MOUTH AT BEDTIME   sodium chloride 0.65 % Soln nasal spray Commonly known as: OCEAN PLACE 1 SPRAY INTO BOTH NOSTRILS AS NEEDED FOR CONGESTION.   SUMAtriptan 25 MG tablet Commonly known as: Imitrex Take 1 tablet (25 mg total) by mouth every 2 (two) hours as needed for migraine. May repeat in 2 hours if headache persists or recurs.   Vitamin D (Cholecalciferol) 25 MCG (1000 UT) Caps Take 1,000 mg by mouth daily.       Past Medical History:  Diagnosis Date  . Allergic rhinitis 05/09/2006  . Allergy   . Anxiety   . Arthritis   . Asthma   . CHF (congestive heart failure) ([place])   . Chronic back pain   . Diabetes mellitus without complication ([place])   . GERD (gastroesophageal reflux disease)   . Heart murmur    as a child  . 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 s/p 12 week course of simeprevir and sofosbuvir between October 2014 and January 2015 with resolution.  Marland Kitchen History of shingles   . HIV infection ([place])    1994  . Hyperlipidemia    no meds taken now  . Hypertension   . Migraine   . Pituitary microadenoma ([place]) 08/08/2014  . Prediabetes   . Refusal of blood transfusions as  patient is Jehovah's Witness   . Secondary adrenal insufficiency ([place]) 06/29/2014  . Urticaria     Past Surgical History:  Procedure Laterality Date  . BUNIONECTOMY     b/l  . COLECTOMY     2003 for diverticulitis, had colostomy bag and then reversed  . COLONOSCOPY    . HAND SURGERY    . NASAL SINUS SURGERY    . SHOULDER SURGERY     left  . TONSILLECTOMY      Review of systems negative except as noted in HPI / PMHx or noted below:  Review of Systems  Constitutional: Negative.   HENT: Negative.   Eyes: Negative.   Respiratory: Negative.   Cardiovascular: Negative.   Gastrointestinal: Negative.  Genitourinary: Negative.   Musculoskeletal: Negative.   Skin: Negative.   Neurological: Negative.   Endo/Heme/Allergies: Negative.   Psychiatric/Behavioral: Negative.      Objective:   Vitals:   10/30/18 1651  BP: 132/84  Pulse: 86  Resp: 16  Temp: 98.3 F (36.8 C)  SpO2: 97%          Physical Exam Constitutional:      Appearance: She is not diaphoretic.  HENT:     Head: Normocephalic.     Right Ear: Ear canal and external ear normal. Tympanic membrane is perforated.     Left Ear: Ear canal and external ear normal. Tympanic membrane is perforated.     Nose: Nose normal. No mucosal edema or rhinorrhea.     Mouth/Throat:     Pharynx: Uvula midline. No oropharyngeal exudate.  Eyes:     Conjunctiva/sclera: Conjunctivae normal.  Neck:     Thyroid: No thyromegaly.     Trachea: Trachea normal. No tracheal tenderness or tracheal deviation.  Cardiovascular:     Rate and Rhythm: Normal rate and regular rhythm.     Heart sounds: Normal heart sounds, S1 normal and S2 normal. No murmur.  Pulmonary:     Effort: No respiratory distress.     Breath sounds: Normal breath sounds. No stridor. No wheezing or rales.  Lymphadenopathy:     Head:     Right side of head: No tonsillar adenopathy.     Left side of head: No tonsillar adenopathy.     Cervical: No cervical  adenopathy.  Skin:    Findings: No erythema or rash.     Nails: There is no clubbing.   Neurological:     Mental Status: She is alert.     Diagnostics:    Spirometry was performed and demonstrated an FEV1 of 1.90 at 79 % of predicted.  Results of a chest x-ray obtained 20 Sep 2018 identified the following:  The heart size and mediastinal contours are within normal limits. Both lungs are clear. The visualized skeletal structures are unremarkable.  Assessment and Plan:   1. Asthma, well controlled, mild persistent   2. Other allergic rhinitis   3. Gastroesophageal reflux disease, esophagitis presence not specified   4. Pruritic disorder     1.  Continue to Treat and prevent inflammation:   A.  Montelukast 10 mg - 1 tablet 1 time per day  B.  Pulmicort 180 -2 inhalations 3-7 times a week   C.  OTC Nasacort -1 spray each nostril 3-7 times per week  2.  Continue to Treat and prevent reflux:   A.  Protonix 40 mg in a.m.  B.  Famotidine 40 mg in p.m.    3.  Can use cetirizine 10 mg -1-2 tablets 1-2 times a day (maximum 40mg )  4.  If needed:   A.  OTC Benadryl  B.  OTC nasal saline  C.  Pro Air HFA 2 puffs every 4-6 hours  D. Nasal saline  5. Hydration of skin with bath / shower followed by Vaseline while wet.  6. Return to clinic in November 2020 or earlier if problem  7. Obtain Flu Vaccine  Overall Marianita appears to be doing relatively well on her current therapy which includes anti-inflammatory medications for both her upper and lower airway and therapy directed against reflux and the use of high-dose cetirizine to treat pruritus.  I am not really going to change any of her therapy at this point in time and  see her back in this clinic in November 2020 or earlier if there is a problem.  Allena Katz, MD Allergy / Immunology [place]

## 2018-10-30 NOTE — Patient Instructions (Addendum)
  1.  Continue to Treat and prevent inflammation:   A.  Montelukast 10 mg - 1 tablet 1 time per day  B.  Pulmicort 180 -2 inhalations 3-7 times a week   C.  OTC Nasacort -1 spray each nostril 3-7 times per week  2.  Continue to Treat and prevent reflux:   A.  Protonix 40 mg in a.m.  B.  Famotidine 40 mg in p.m.    3.  Can use cetirizine 10 mg -1-2 tablets 1-2 times a day (maximum 40mg )  4.  If needed:   A.  OTC Benadryl  B.  OTC nasal saline  C.  Pro Air HFA 2 puffs every 4-6 hours  D. Nasal saline  5. Hydration of skin with bath / shower followed by Vaseline while wet.  6. Return to clinic in November 2020 or earlier if problem  7. Obtain Flu Vaccine

## 2018-10-31 ENCOUNTER — Encounter: Payer: Self-pay | Admitting: Allergy and Immunology

## 2018-11-02 DIAGNOSIS — H40023 Open angle with borderline findings, high risk, bilateral: Secondary | ICD-10-CM | POA: Diagnosis not present

## 2018-11-02 DIAGNOSIS — E119 Type 2 diabetes mellitus without complications: Secondary | ICD-10-CM | POA: Diagnosis not present

## 2018-11-02 DIAGNOSIS — D443 Neoplasm of uncertain behavior of pituitary gland: Secondary | ICD-10-CM | POA: Diagnosis not present

## 2018-11-02 DIAGNOSIS — H40033 Anatomical narrow angle, bilateral: Secondary | ICD-10-CM | POA: Diagnosis not present

## 2018-11-02 LAB — HM DIABETES EYE EXAM

## 2018-11-12 ENCOUNTER — Other Ambulatory Visit: Payer: Self-pay | Admitting: Internal Medicine

## 2018-11-12 ENCOUNTER — Other Ambulatory Visit: Payer: Self-pay

## 2018-11-12 ENCOUNTER — Ambulatory Visit
Admission: RE | Admit: 2018-11-12 | Discharge: 2018-11-12 | Disposition: A | Discharge status: Home / Self Care | Payer: Medicaid Other | Source: Ambulatory Visit | Attending: Internal Medicine | Admitting: Internal Medicine

## 2018-11-12 DIAGNOSIS — Z5181 Encounter for therapeutic drug level monitoring: Secondary | ICD-10-CM | POA: Diagnosis not present

## 2018-11-12 DIAGNOSIS — M19041 Primary osteoarthritis, right hand: Secondary | ICD-10-CM | POA: Diagnosis not present

## 2018-11-12 DIAGNOSIS — E119 Type 2 diabetes mellitus without complications: Secondary | ICD-10-CM | POA: Diagnosis not present

## 2018-11-12 DIAGNOSIS — M25541 Pain in joints of right hand: Secondary | ICD-10-CM

## 2018-11-12 DIAGNOSIS — Z833 Family history of diabetes mellitus: Secondary | ICD-10-CM | POA: Diagnosis not present

## 2018-11-12 DIAGNOSIS — E2749 Other adrenocortical insufficiency: Secondary | ICD-10-CM | POA: Diagnosis not present

## 2018-11-12 DIAGNOSIS — D352 Benign neoplasm of pituitary gland: Secondary | ICD-10-CM | POA: Diagnosis not present

## 2018-11-14 ENCOUNTER — Other Ambulatory Visit: Payer: Self-pay | Admitting: Internal Medicine

## 2018-11-14 DIAGNOSIS — M85839 Other specified disorders of bone density and structure, unspecified forearm: Secondary | ICD-10-CM

## 2018-11-15 DIAGNOSIS — H7202 Central perforation of tympanic membrane, left ear: Secondary | ICD-10-CM | POA: Diagnosis not present

## 2018-11-22 ENCOUNTER — Other Ambulatory Visit: Payer: Self-pay | Admitting: Internal Medicine

## 2018-11-22 DIAGNOSIS — Z1231 Encounter for screening mammogram for malignant neoplasm of breast: Secondary | ICD-10-CM

## 2018-11-29 ENCOUNTER — Encounter: Payer: Self-pay | Admitting: Internal Medicine

## 2018-11-29 ENCOUNTER — Other Ambulatory Visit: Payer: Self-pay

## 2018-11-29 ENCOUNTER — Ambulatory Visit: Payer: Medicaid Other | Admitting: Internal Medicine

## 2018-11-29 VITALS — BP 117/74 | HR 90 | Temp 98.4°F | Ht 61.0 in | Wt 176.0 lb

## 2018-11-29 DIAGNOSIS — Z79899 Other long term (current) drug therapy: Secondary | ICD-10-CM

## 2018-11-29 DIAGNOSIS — E099 Drug or chemical induced diabetes mellitus without complications: Secondary | ICD-10-CM

## 2018-11-29 DIAGNOSIS — M1811 Unilateral primary osteoarthritis of first carpometacarpal joint, right hand: Secondary | ICD-10-CM | POA: Insufficient documentation

## 2018-11-29 DIAGNOSIS — E1169 Type 2 diabetes mellitus with other specified complication: Secondary | ICD-10-CM

## 2018-11-29 DIAGNOSIS — I10 Essential (primary) hypertension: Secondary | ICD-10-CM

## 2018-11-29 DIAGNOSIS — M19041 Primary osteoarthritis, right hand: Secondary | ICD-10-CM | POA: Diagnosis not present

## 2018-11-29 DIAGNOSIS — T380X5A Adverse effect of glucocorticoids and synthetic analogues, initial encounter: Secondary | ICD-10-CM

## 2018-11-29 LAB — GLUCOSE, CAPILLARY: Glucose-Capillary: 149 mg/dL — ABNORMAL HIGH (ref 70–99)

## 2018-11-29 LAB — HEMOGLOBIN A1C: Hemoglobin A1C: 6.3

## 2018-11-29 MED ORDER — WRIST/THUMB SPLINT/RIGHT MED MISC: 0 refills | Status: DC

## 2018-11-29 MED ORDER — DICLOFENAC SODIUM 1 % TD GEL: 2.0000 g | Freq: Four times a day (QID) | TRANSDERMAL | Status: DC

## 2018-11-29 NOTE — Patient Instructions (Addendum)
For your thumb pain, please wear the thumb/wrist splint to help stabilize the joint. You can also apply diclofenac gel to the area up to 4 times a day. Call the office if your pain does not improve with these therapies in the next 5-6 weeks.  Your blood pressure looks great today! Continuing taking your amlodipine and metoprolol at home.  It was nice meeting you!

## 2018-11-29 NOTE — Assessment & Plan Note (Signed)
HPI - Pt currently taking metoprolol 25mg  BID and amlodipine 10mg  daily. Denies any medication side effects. Does not need refills at this time. BP today 117/74 and 125/78 on manual recheck.  Assessment and Plan - Blood pressure well controlled at this time. No need for medication adjustment. Instructed patient to continue taking metoprolol 25mg  BID and amlodipine 10mg  daily.

## 2018-11-29 NOTE — Progress Notes (Signed)
   CC: R thumb pain and to establish care  HPI:  [name] is a 58 y.o. F who comes to clinic today for R thumb pain and to establish care and follow-up on her essential hypertension. Please refer to problem based charting.    Past Medical History:  Diagnosis Date  . Allergic rhinitis 05/09/2006  . Allergy   . Anxiety   . Arthritis   . Asthma   . CHF (congestive heart failure) ([place])   . Chronic back pain   . Diabetes mellitus without complication ([place])   . GERD (gastroesophageal reflux disease)   . Heart murmur    as a child  . 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 s/p 12 week course of simeprevir and sofosbuvir between October 2014 and January 2015 with resolution.  Marland Kitchen History of shingles   . HIV infection ([place])    1994  . Hyperlipidemia    no meds taken now  . Hypertension   . Migraine   . Pituitary microadenoma ([place]) 08/08/2014  . Prediabetes   . Refusal of blood transfusions as patient is Jehovah's Witness   . Secondary adrenal insufficiency ([place]) 06/29/2014  . Urticaria    Review of Systems:   Review of Systems  Constitutional: Negative for chills and fever.  HENT: Positive for ear discharge. Negative for ear pain, sinus pain and sore throat.   Respiratory: Negative for shortness of breath.   Cardiovascular: Negative for chest pain.  Musculoskeletal: Positive for joint pain (R thumb).       No swelling.  Neurological: Negative for headaches.   Physical Exam:  Vitals:   11/29/18 1332  BP: 117/74  Pulse: 90  Temp: 98.4 F (36.9 C)  TempSrc: Oral  SpO2: 97%  Weight: 176 lb (79.8 kg)  Height: 5\' 1"  (1.549 m)   Physical Exam Vitals signs reviewed.  Constitutional:      General: She is not in acute distress.    Appearance: Normal appearance. She is obese.  Cardiovascular:     Rate and Rhythm: Normal rate and regular rhythm.     Heart sounds: Normal heart sounds. No murmur. No friction rub. No gallop.   Pulmonary:      Effort: Pulmonary effort is normal.     Breath sounds: Normal breath sounds. No wheezing, rhonchi or rales.  Abdominal:     General: Abdomen is flat. Bowel sounds are normal.     Palpations: Abdomen is soft.     Tenderness: There is no abdominal tenderness.  Musculoskeletal:     Comments: Tenderness to palpation of R 1st MCP joint. Mild swelling at base of R thumb. Full ROM in R thumb though painful with movement. Sensation intact bilaterally. No joint deformities.    Assessment & Plan:   See Encounters Tab for problem based charting.  Patient seen with Dr. Evette Doffing

## 2018-11-29 NOTE — Assessment & Plan Note (Signed)
Assessment and Plan - Steroid induced diabetes mellitus  Pt follows with endocrinology (Dr. Buddy Duty). She is currently on Victoza 1.2mg  once a day. A1c was 6.3 on 11/12/18. Plan to follow along with endo recommendations per chart review.

## 2018-11-29 NOTE — Assessment & Plan Note (Signed)
HPI - Pt having R thumb pain today. It began 2-3 weeks ago. The pain came on suddenly and was worsening but has now remained a constant 10/10. Pt describes the pain as sharp and worsens with movement. It limits her daily activities like writing or holding cups. Endorses weakness at the thumb and some swelling. Denies erythema, fevers or chills.   Her endocrinologist ordered a hand x-ray which showed no acute process, and diffuse osteopenia and degenerative change.   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 Could consider rheumatoid arthritis, gout, or tendinopathy but much less likely at this time. Ordered diclofenac gel 1% to apply QID as needed and pt received a thumb/wrist splint for joint stabilization while in clinic today. Would expect a good response to topical NSAIDs in like of how superficial the joint is, but instructed patient to follow-up with me if her symptoms worsened or did not improve in the next 5-6 weeks. Could consider referral to a hand orthopedic in the future for potential joint injections.

## 2018-11-30 ENCOUNTER — Telehealth: Payer: Self-pay | Admitting: *Deleted

## 2018-11-30 NOTE — Telephone Encounter (Signed)
Pt was called / informed "try the voltaren gel as it likely will be more effective on the superficial thumb joint than her prior back pain. She can also continue to trial ibuprofen in addition to the gel, and follow-up in 5-6 weeks if there's no improvement." per Dr Ronnald Ramp. Pt states ok and thanks for calling.

## 2018-11-30 NOTE — Telephone Encounter (Signed)
I'd recommend she still try the voltaren gel as it likely will be more effective on the superficial thumb joint than her prior back pain. She can also continue to trial ibuprofen in addition to the gel, and follow-up in 5-6 weeks if there's no improvement.

## 2018-11-30 NOTE — Telephone Encounter (Signed)
Pt stated her doctor told her to try diclofenac gel for thumb pain; stated didn't realized this is the same as voltaren gel which she had tried before for her back and it did not help. Wants to know what else she can try (non-narcotic)? Thanks

## 2018-12-03 NOTE — Progress Notes (Signed)
Internal Medicine Clinic Attending  I saw and evaluated the patient.  I personally confirmed the key portions of the history and exam documented by Dr. Jones and I reviewed pertinent patient test results.  The assessment, diagnosis, and plan were formulated together and I agree with the documentation in the resident's note.     

## 2018-12-10 ENCOUNTER — Ambulatory Visit (INDEPENDENT_AMBULATORY_CARE_PROVIDER_SITE_OTHER): Payer: Medicaid Other

## 2018-12-10 ENCOUNTER — Ambulatory Visit (INDEPENDENT_AMBULATORY_CARE_PROVIDER_SITE_OTHER): Payer: Medicaid Other | Admitting: Orthopedic Surgery

## 2018-12-10 ENCOUNTER — Encounter: Payer: Self-pay | Admitting: Orthopedic Surgery

## 2018-12-10 ENCOUNTER — Other Ambulatory Visit: Payer: Self-pay

## 2018-12-10 DIAGNOSIS — M79644 Pain in right finger(s): Secondary | ICD-10-CM

## 2018-12-10 DIAGNOSIS — G8929 Other chronic pain: Secondary | ICD-10-CM

## 2018-12-10 NOTE — Progress Notes (Signed)
Office Visit Note   Patient: [name] [name]           Date of Birth: 07/03/1960           MRN: [id number] Visit Date: 12/10/2018 Requested by: Ladona Horns, MD [address] [place],  [place] [address] PCP: Ladona Horns, MD  Subjective: No chief complaint on file.   HPI: [name] [name] is a right-hand dominant 58 y.o. female who presents to the office complaining of right thumb pain.  Patient states that her right thumb pain began 3 weeks ago.  Onset was gradual without any acute injury.  Her pain has steadily been increasing.  She localizes her pain to the thumb MCP joint.  She notes sharp, shooting pain without numbness and tingling.  The pain wakes her up at night.  Her pain is worse with gripping or making a fist.  She feels weak with gripping.  She has tried Aspercreme, Epsom salt baths, hyper Profen without significant relief.  She has not tried a cortisone injection, brace.  No history of surgery on the right thumb. She has never had pain like this prior.             ROS:  All systems reviewed are negative as they relate to the chief complaint within the history of present illness.  Patient denies fevers or chills.  Assessment & Plan: Visit Diagnoses:  1. Chronic pain of right thumb     Plan: Patient is a 58 year old female who presents with 3-week history of right thumb pain.  Pain is localized to the thumb MCP joint and x-rays taken today reveal subluxation of the proximal phalanx over the metacarpal with some findings of osteoarthritis.  On exam she has tenderness of the MCP joint and difficulty with extension of the right thumb.  She is weak but I think the EPL is intact and functioning, however it may be subluxing.  Ordered MRI of right thumb to evaluate the joint and extensor tendon.  Patient given CMC type thumb splint.  She will follow-up after MRI to review results.  We will consider cortisone injection based on the results.  Follow-Up Instructions: No follow-ups  on file.   Orders:  Orders Placed This Encounter  Procedures  . XR Finger Thumb Right  . MR FINGERS RICHT WO CONTRAST   No orders of the defined types were placed in this encounter.     Procedures: No procedures performed   Clinical Data: No additional findings.  Objective: Vital Signs: There were no vitals taken for this visit.  Physical Exam:  Constitutional: Patient appears well-developed HEENT:  Head: Normocephalic Eyes:EOM are normal Neck: Normal range of motion Cardiovascular: Normal rate Pulmonary/chest: Effort normal Neurologic: Patient is alert Skin: Skin is warm Psychiatric: Patient has normal mood and affect  Ortho Exam:  Right hand exam No tenderness throughout the index, middle, ring, little fingers No tenderness over the right thumb IP joint.  No pain with range of motion of the IP joint Tenderness over the thumb MCP joint, most tender on the dorsal aspect.  No significant tenderness over the A1 pulley. No triggering of the right thumb when forming a fist Swelling of the thumb MCP joint Mild pain with circumduction of the right thumb in the first CMC joint. 4/5 motor strength with extension of the right thumb 5/5 motor strength with flexion of the right thumb  Specialty Comments:  No specialty comments available.  Imaging: No results found.  PMFS History: Patient Active Problem List   Diagnosis Date Noted  . 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 Cough 09/20/2018  . Chronic migraine 08/27/2018  . Disorder of left eustachian tube 05/19/2017  . Vertigo of central origin 01/23/2017  . 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 BPPV (benign paroxysmal positional vertigo) 10/02/2015  . Liver fibrosis ([place]) 12/04/2014  . Pituitary microadenoma ([place]) 08/08/2014  . 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 Vitamin D deficiency 06/29/2014  . Secondary adrenal insufficiency ([place]) 06/29/2014   . History of tympanostomy tube placement 02/07/2014  . Refusal of blood transfusions as patient is Jehovah's Witness 11/18/2013  . Hepatitis C virus infection cured after antiviral drug therapy 03/16/2012  . Health care maintenance 12/15/2011  . Opioid dependence ([place]) 10/05/2010  . HTN (hypertension) 10/05/2010  . Hyperlipidemia associated with type 2 diabetes mellitus ([place]) 10/23/2008  . HIV disease ([place]) 05/09/2006  . Allergic rhinitis 05/09/2006  . GERD 05/09/2006   Past Medical History:  Diagnosis Date  . Allergic rhinitis 05/09/2006  . Allergy   . Anxiety   . Arthritis   . Asthma   . CHF (congestive heart failure) ([place])   . Chronic back pain   . Diabetes mellitus without complication ([place])   . GERD (gastroesophageal reflux disease)   . Heart murmur    as a child  . 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 s/p 12 week course of simeprevir and sofosbuvir between October 2014 and January 2015 with resolution.  Marland Kitchen History of shingles   . HIV infection ([place])    1994  . Hyperlipidemia    no meds taken now  . Hypertension   . Migraine   . Pituitary microadenoma ([place]) 08/08/2014  . Prediabetes   . Refusal of blood transfusions as patient is Jehovah's Witness   . Secondary adrenal insufficiency ([place]) 06/29/2014  . Urticaria     Family History  Problem Relation Age of Onset  . Heart disease Mother   . Diabetes Mother   . Stroke Mother   . Heart disease Father   . Stroke Father   . Diabetes Father   . Hepatitis Sister        hcv  . Asthma Sister   . Allergic rhinitis Sister   . Stroke Other   . Colon polyps Brother   . Renal Disease Brother   . Cancer Sister        lung  . Asthma Sister   . Allergic rhinitis Sister   . Cancer Maternal Aunt   . Cancer Maternal Aunt   . Colon cancer Neg Hx   . Esophageal cancer Neg Hx   . Stomach cancer Neg Hx   . Rectal cancer Neg Hx   . Angioedema Neg Hx   . Eczema Neg Hx   . Urticaria Neg Hx      Past Surgical History:  Procedure Laterality Date  . BUNIONECTOMY     b/l  . COLECTOMY     2003 for diverticulitis, had colostomy bag and then reversed  . COLONOSCOPY    . HAND SURGERY    . NASAL SINUS SURGERY    . SHOULDER SURGERY     left  . TONSILLECTOMY     Social History   Occupational History  . Occupation: Disabled  Tobacco Use  . Smoking status: Former Smoker    Years: 20.00    Types: Cigarettes  Quit date: 04/26/2007    Years since quitting: 11.6  . Smokeless tobacco: Never Used  . Tobacco comment: QUIT 2009  Substance and Sexual Activity  . Alcohol use: No    Alcohol/week: 0.0 standard drinks  . Drug use: No  . Sexual activity: Not Currently    Partners: Male    Birth control/protection: Condom    Comment: pt. given condoms

## 2018-12-11 ENCOUNTER — Ambulatory Visit: Payer: Medicaid Other | Admitting: Orthopedic Surgery

## 2018-12-12 ENCOUNTER — Encounter: Payer: Self-pay | Admitting: Orthopedic Surgery

## 2018-12-24 ENCOUNTER — Ambulatory Visit: Payer: Medicaid Other | Admitting: Orthopedic Surgery

## 2018-12-29 ENCOUNTER — Other Ambulatory Visit: Payer: Medicaid Other

## 2019-01-03 ENCOUNTER — Encounter: Payer: Self-pay | Admitting: Orthopedic Surgery

## 2019-01-03 ENCOUNTER — Ambulatory Visit (INDEPENDENT_AMBULATORY_CARE_PROVIDER_SITE_OTHER): Payer: Medicaid Other | Admitting: Orthopedic Surgery

## 2019-01-03 DIAGNOSIS — G8929 Other chronic pain: Secondary | ICD-10-CM

## 2019-01-03 DIAGNOSIS — M79644 Pain in right finger(s): Secondary | ICD-10-CM | POA: Diagnosis not present

## 2019-01-05 ENCOUNTER — Encounter: Payer: Self-pay | Admitting: Orthopedic Surgery

## 2019-01-05 NOTE — Progress Notes (Signed)
Office Visit Note   Patient: [name] [name]           Date of Birth: [birth year]           MRN: [id number] Visit Date: 01/03/2019 Requested by: Ladona Horns, MD [address] [place],  [place] [address] PCP: Ladona Horns, MD  Subjective: Chief Complaint  Patient presents with  . Right Hand - Pain    HPI: Mykal presents for follow-up of right thumb pain.  She has pain at the MCP joint.  She has some static subluxation on plain radiographs.  Ultrasound examination demonstrates intact extensor tendon along with spurring around that MCP joint.  Not much in the way of [facility] symptomatic arthritis clinically or on radiograph.  She has tried a brace which has not been helpful.  Denies any numbness and tingling in the thumb.  Reports a burning type pain along with decreased motion.              ROS: All systems reviewed are negative as they relate to the chief complaint within the history of present illness.  Patient denies  fevers or chills.   Assessment & Plan: Visit Diagnoses:  1. Chronic pain of right thumb     Plan: Impression is right thumb pain with significant arthritis at the MCP joint.  Her [place] joint appears to be asymptomatic.  We talked about an injection.  I think that would be temporary relief only.  She is interested in a more permanent solution.  I think she would do well to consult with a hand surgeon for fusion of that [facility] joint.  I will see her back as needed.  Follow-Up Instructions: Return if symptoms worsen or fail to improve.   Orders:  Orders Placed This Encounter  Procedures  . Ambulatory referral to Orthopedic Surgery   No orders of the defined types were placed in this encounter.     Procedures: No procedures performed   Clinical Data: No additional findings.  Objective: Vital Signs: There were no vitals taken for this visit.  Physical Exam:   Constitutional: Patient appears well-developed HEENT:  Head: Normocephalic Eyes:EOM are normal  Neck: Normal range of motion Cardiovascular: Normal rate Pulmonary/chest: Effort normal Neurologic: Patient is alert Skin: Skin is warm Psychiatric: Patient has normal mood and affect    Ortho Exam: Ortho exam demonstrates full active and passive range of motion on the left-hand side of the [facility] MCP and IP joint.  On the right-hand side she has diminished MCP flexion and extension compared to the left.  Does have tenderness to palpation around the joint.  EPL FPL function is intact.  No snuffbox tenderness is present.  EPL tendon is functional and palpable on the right-hand side.  Specialty Comments:  No specialty comments available.  Imaging: No results found.   PMFS History: Patient Active Problem List   Diagnosis Date Noted  . 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 Cough 09/20/2018  . Chronic migraine 08/27/2018  . Disorder of left eustachian tube 05/19/2017  . Vertigo of central origin 01/23/2017  . 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 BPPV (benign paroxysmal positional vertigo) 10/02/2015  . Liver fibrosis ([place]) 12/04/2014  . Pituitary microadenoma ([place]) 08/08/2014  . 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 Vitamin D deficiency 06/29/2014  . Secondary adrenal insufficiency ([place]) 06/29/2014  . History of tympanostomy tube placement 02/07/2014  . Refusal of blood transfusions as patient is Jehovah's Witness  11/18/2013  . Hepatitis C virus infection cured after antiviral drug therapy 03/16/2012  . Health care maintenance 12/15/2011  . Opioid dependence ([place]) 10/05/2010  . HTN (hypertension) 10/05/2010  . Hyperlipidemia associated with type 2 diabetes mellitus ([place]) 10/23/2008  . HIV disease ([place]) 05/09/2006  . Allergic rhinitis 05/09/2006  . GERD 05/09/2006   Past Medical History:  Diagnosis Date  . Allergic rhinitis 05/09/2006  . Allergy   . Anxiety   . Arthritis   . Asthma   . CHF (congestive heart  failure) ([place])   . Chronic back pain   . Diabetes mellitus without complication ([place])   . GERD (gastroesophageal reflux disease)   . Heart murmur    as a child  . 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 s/p 12 week course of simeprevir and sofosbuvir between October 2014 and January 2015 with resolution.  Marland Kitchen History of shingles   . HIV infection ([place])    1994  . Hyperlipidemia    no meds taken now  . Hypertension   . Migraine   . Pituitary microadenoma ([place]) 08/08/2014  . Prediabetes   . Refusal of blood transfusions as patient is Jehovah's Witness   . Secondary adrenal insufficiency ([place]) 06/29/2014  . Urticaria     Family History  Problem Relation Age of Onset  . Heart disease Mother   . Diabetes Mother   . Stroke Mother   . Heart disease Father   . Stroke Father   . Diabetes Father   . Hepatitis Sister        hcv  . Asthma Sister   . Allergic rhinitis Sister   . Stroke Other   . Colon polyps Brother   . Renal Disease Brother   . Cancer Sister        lung  . Asthma Sister   . Allergic rhinitis Sister   . Cancer Maternal Aunt   . Cancer Maternal Aunt   . Colon cancer Neg Hx   . Esophageal cancer Neg Hx   . Stomach cancer Neg Hx   . Rectal cancer Neg Hx   . Angioedema Neg Hx   . Eczema Neg Hx   . Urticaria Neg Hx     Past Surgical History:  Procedure Laterality Date  . BUNIONECTOMY     b/l  . COLECTOMY     2003 for diverticulitis, had colostomy bag and then reversed  . COLONOSCOPY    . HAND SURGERY    . NASAL SINUS SURGERY    . SHOULDER SURGERY     left  . TONSILLECTOMY     Social History   Occupational History  . Occupation: Disabled  Tobacco Use  . Smoking status: Former Smoker    Years: 20.00    Types: Cigarettes    Quit date: 04/26/2007    Years since quitting: 11.7  . Smokeless tobacco: Never Used  . Tobacco comment: QUIT 2009  Substance and Sexual Activity  . Alcohol use: No    Alcohol/week: 0.0 standard  drinks  . Drug use: No  . Sexual activity: Not Currently    Partners: Male    Birth control/protection: Condom    Comment: pt. given condoms

## 2019-01-09 ENCOUNTER — Other Ambulatory Visit: Payer: Self-pay | Admitting: Neurology

## 2019-01-09 ENCOUNTER — Other Ambulatory Visit: Payer: Self-pay | Admitting: Infectious Diseases

## 2019-01-09 ENCOUNTER — Other Ambulatory Visit: Payer: Self-pay | Admitting: Allergy and Immunology

## 2019-01-09 DIAGNOSIS — B20 Human immunodeficiency virus [HIV] disease: Secondary | ICD-10-CM

## 2019-01-10 ENCOUNTER — Telehealth: Payer: Self-pay | Admitting: Orthopedic Surgery

## 2019-01-10 NOTE — Telephone Encounter (Signed)
Can you please edit referral to state referral,insurance,demographics,OV note faxed today.

## 2019-01-10 NOTE — Telephone Encounter (Signed)
Patient called. Said she is in a lot of pain. Would like to see a hand specalist ASAP. She has not heard anything from anyone about an appointment. Says she was supposed to be referred to someone. Her call back number is [phone]. Thanks

## 2019-01-11 NOTE — Telephone Encounter (Signed)
noted 

## 2019-01-14 ENCOUNTER — Ambulatory Visit: Payer: Medicaid Other

## 2019-01-14 ENCOUNTER — Other Ambulatory Visit: Payer: Self-pay

## 2019-01-17 ENCOUNTER — Ambulatory Visit (INDEPENDENT_AMBULATORY_CARE_PROVIDER_SITE_OTHER): Payer: Medicaid Other | Admitting: *Deleted

## 2019-01-17 ENCOUNTER — Other Ambulatory Visit: Payer: Self-pay

## 2019-01-17 DIAGNOSIS — M19041 Primary osteoarthritis, right hand: Secondary | ICD-10-CM | POA: Diagnosis not present

## 2019-01-17 DIAGNOSIS — Z23 Encounter for immunization: Secondary | ICD-10-CM

## 2019-01-18 ENCOUNTER — Telehealth: Payer: Self-pay | Admitting: *Deleted

## 2019-01-18 ENCOUNTER — Encounter: Payer: Medicaid Other | Admitting: Physical Medicine & Rehabilitation

## 2019-01-18 NOTE — Telephone Encounter (Signed)
   [facility] Pre-operative Risk Assessment    Request for surgical clearance:  1. What type of surgery is being performed? RIGHT THUMB METACARPAL PHALANGEAL JOINT FUSION, RIGHT THUMB CARPOMETACARPAL INJECTION   2. When is this surgery scheduled? 02/11/19   3. What type of clearance is required (medical clearance vs. Pharmacy clearance to hold med vs. Both)? MEDICAL  4. Are there any medications that need to be held prior to surgery and how long? NONE LISTED    5. Practice name and name of physician performing surgery? [facility]; DR. Charlotte Crumb   6. What is your office phone number [phone]    7.   What is your office fax number [phone]  8.   Anesthesia type (None, local, MAC, general) ? AXILLARY BLOCK/MAC   [name] [name] 01/18/2019, 9:29 AM  _________________________________________________________________   (provider comments below)

## 2019-01-18 NOTE — Telephone Encounter (Signed)
   Primary Cardiologist: Jenkins Rouge, MD  Chart reviewed as part of pre-operative protocol coverage. Patient was contacted 01/18/2019 in reference to pre-operative risk assessment for pending surgery as outlined below.  [name] [name] was last seen on 05/10/2018 by Dr. Johnsie Cancel.  Since that day, [name] [name] has done well without any chest pain or shortness of breath. Last myoview in 04/2018 was normal.  Therefore, based on ACC/AHA guidelines, the patient would be at acceptable risk for the planned procedure without further cardiovascular testing.   I will route this recommendation to the requesting party via Epic fax function and remove from pre-op pool.  Please call with questions.  [place], [place] 01/18/2019, 1:45 PM

## 2019-01-24 ENCOUNTER — Other Ambulatory Visit: Payer: Self-pay

## 2019-01-24 ENCOUNTER — Encounter: Payer: Self-pay | Admitting: Physical Medicine & Rehabilitation

## 2019-01-24 ENCOUNTER — Encounter: Payer: Medicaid Other | Attending: Physical Medicine & Rehabilitation | Admitting: Physical Medicine & Rehabilitation

## 2019-01-24 VITALS — BP 127/82 | HR 91 | Temp 97.5°F | Ht 61.0 in | Wt 177.0 lb

## 2019-01-24 DIAGNOSIS — M533 Sacrococcygeal disorders, not elsewhere classified: Secondary | ICD-10-CM | POA: Diagnosis not present

## 2019-01-24 NOTE — Patient Instructions (Signed)
Sacroiliac injection was performed today. A combination of a naming medicine plus a cortisone medicine was injected. The injection was done under x-ray guidance. This procedure has been performed to help reduce low back and buttocks pain as well as potentially hip pain. The duration of this injection is variable lasting from hours to  Months. It may repeated if needed. 

## 2019-01-24 NOTE — Progress Notes (Signed)
  PROCEDURE RECORD [facility]   Name: [name] [name] DOB:[birth year] MRN: [id number]  Date:01/24/2019  Physician: Alysia Penna, MD    Nurse/CMA: Bright, CMA  Allergies:  Allergies  Allergen Reactions  . Acetaminophen Other (See Comments)    Inflamed liver, hospitalized REACTION: Had liver problems, was hospitalized Inflamed liver, hospitalized  . Morphine Hives and Rash    REACTION: rash  . Triamterene Hives  . Dyazide [Hydrochlorothiazide W-Triamterene] Hives  . Lisinopril     Cough   . Topamax [Topiramate]   . Losartan Rash    Pt had rash, worsening dizziness, and nausea after starting losartan, which improved after stopping losartan    Consent Signed: Yes.    Is patient diabetic? Yes.    CBG today? 122  Pregnant: No. LMP: No LMP recorded. Patient is postmenopausal. (age 98-55)  Anticoagulants: no Anti-inflammatory: no Antibiotics: no  Procedure: Bilateral Sacroiliac Injection  Position: Prone   Start Time: 340pm End Time: 345pm Fluoro Time: 23s  RN/CMA Omkar Stratmann,CMA Bright,CMA    Time 3:00pm 352pm    BP 127/82 133/86    Pulse 91 90    Respirations 16 16    O2 Sat 91 98    S/S 6 6    Pain Level 10/10 7/10     D/C home with sister in law, [name] & O X 3, D/C instructions reviewed, and sits independently.

## 2019-01-24 NOTE — Progress Notes (Signed)
Bilteral  sacroiliac injection under fluoroscopic guidance  Indication: Bilateral Low back and buttocks pain not relieved by medication management and other conservative care.  Informed consent was obtained after describing risks and benefits of the procedure with the patient, this includes bleeding, bruising, infection, paralysis and medication side effects. The patient wishes to proceed and has given written consent. The patient was placed in a prone position. The lumbar and sacral area was marked and prepped with Betadine. A 25-gauge 1-1/2 inch needle was inserted into the skin and subcutaneous tissue and 1 mL of 1% lidocaine was injected. Then a 25-gauge 3 inch spinal needle was inserted under fluoroscopic guidance into the left sacroiliac joint. AP and lateral images were utilized. Isovue 200x0.5 mL under live fluoroscopy demonstrated no intravascular uptake. Then a solution containing one ML of 6 mg per mLbetamethasone and 2 ML of 2% lidocaine MPF was injected x1.5 mL.Same procedure performed on the right with same technique and equipment.  Patient tolerated the procedure well. Post procedure instructions were given. Please see post procedure form. 

## 2019-01-28 ENCOUNTER — Ambulatory Visit: Payer: Medicaid Other | Admitting: Neurology

## 2019-01-31 ENCOUNTER — Ambulatory Visit
Admission: RE | Admit: 2019-01-31 | Discharge: 2019-01-31 | Disposition: A | Discharge status: Home / Self Care | Payer: Medicaid Other | Source: Ambulatory Visit | Attending: Internal Medicine | Admitting: Internal Medicine

## 2019-01-31 ENCOUNTER — Other Ambulatory Visit: Payer: Self-pay

## 2019-01-31 DIAGNOSIS — M85852 Other specified disorders of bone density and structure, left thigh: Secondary | ICD-10-CM | POA: Diagnosis not present

## 2019-01-31 DIAGNOSIS — Z1231 Encounter for screening mammogram for malignant neoplasm of breast: Secondary | ICD-10-CM | POA: Diagnosis not present

## 2019-01-31 DIAGNOSIS — Z780 Asymptomatic menopausal state: Secondary | ICD-10-CM | POA: Diagnosis not present

## 2019-01-31 DIAGNOSIS — M85839 Other specified disorders of bone density and structure, unspecified forearm: Secondary | ICD-10-CM

## 2019-01-31 DIAGNOSIS — M810 Age-related osteoporosis without current pathological fracture: Secondary | ICD-10-CM | POA: Diagnosis not present

## 2019-02-11 DIAGNOSIS — M19041 Primary osteoarthritis, right hand: Secondary | ICD-10-CM | POA: Diagnosis not present

## 2019-02-11 DIAGNOSIS — M1811 Unilateral primary osteoarthritis of first carpometacarpal joint, right hand: Secondary | ICD-10-CM | POA: Diagnosis not present

## 2019-02-13 ENCOUNTER — Other Ambulatory Visit: Payer: Self-pay | Admitting: Internal Medicine

## 2019-02-13 ENCOUNTER — Other Ambulatory Visit: Payer: Self-pay | Admitting: *Deleted

## 2019-02-13 MED ORDER — AMLODIPINE BESYLATE 10 MG PO TABS: 10.0000 mg | ORAL_TABLET | Freq: Every day | ORAL | 2 refills | Status: DC

## 2019-02-14 DIAGNOSIS — M19041 Primary osteoarthritis, right hand: Secondary | ICD-10-CM | POA: Diagnosis not present

## 2019-02-14 DIAGNOSIS — M79644 Pain in right finger(s): Secondary | ICD-10-CM | POA: Diagnosis not present

## 2019-02-25 IMAGING — US US ABDOMEN COMPLETE W/ ELASTOGRAPHY
1 series · 12 of 12 positions shown · non-contrast
Comparison: 03/20/2017 ultrasound abdomen

CLINICAL DATA: Chronic hepatitis-C without hepatic coma, liver
mass, history CHF, diabetes mellitus, hypertension, HIV, former
smoker



[Series 1: us abdomen complete w/ elastography · 0.19mm/px · 12 of 12 slices shown]
[im 1/12]
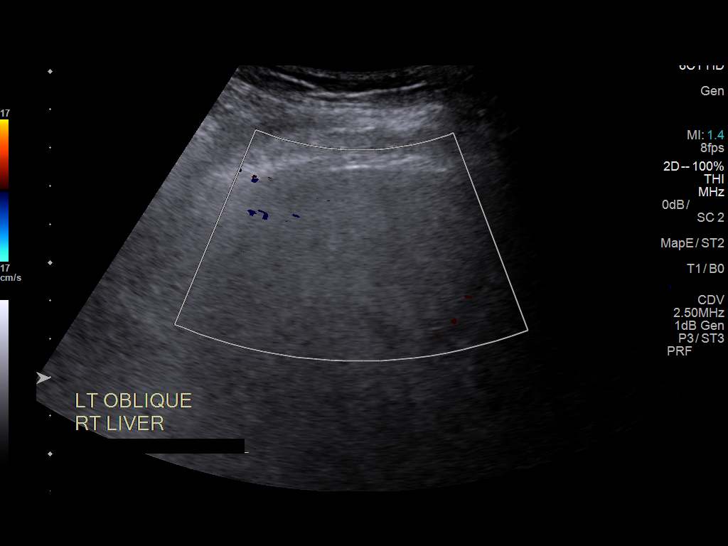
[im 2/12]
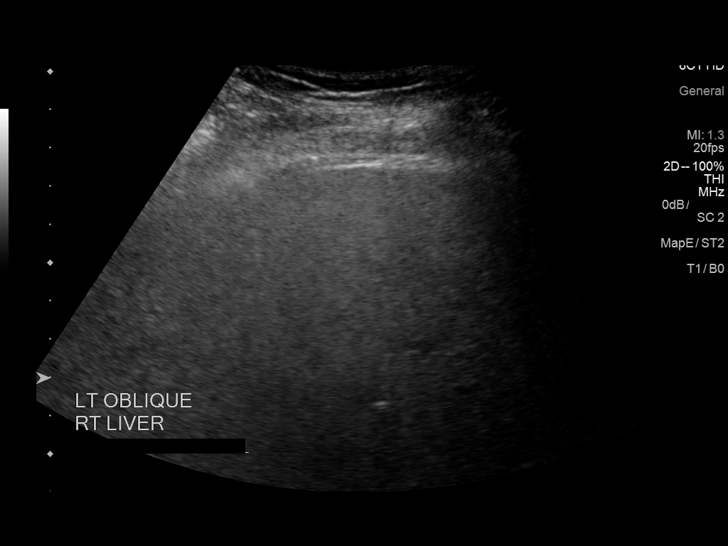
[im 3/12]
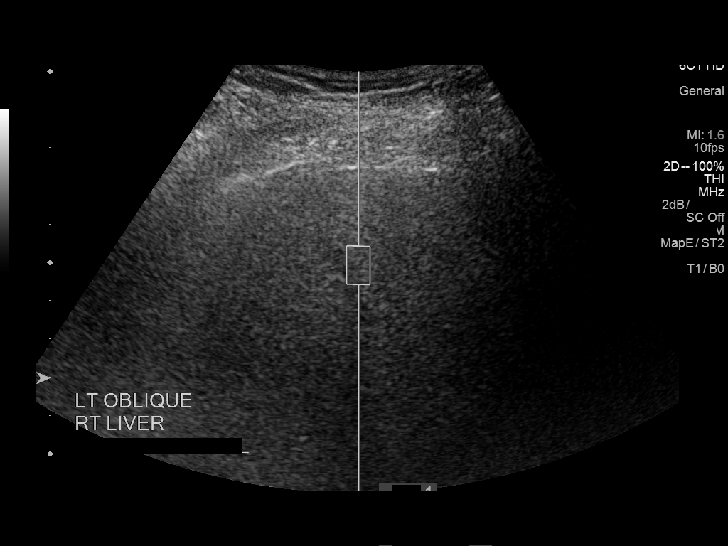
[im 4/12]
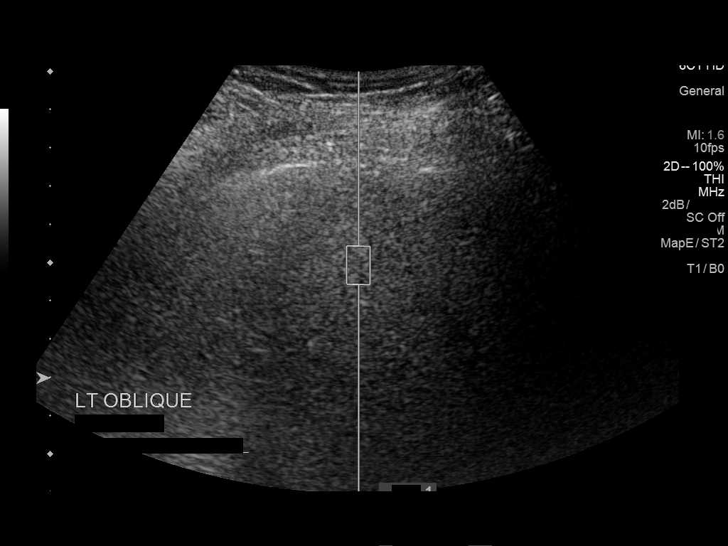
[im 5/12]
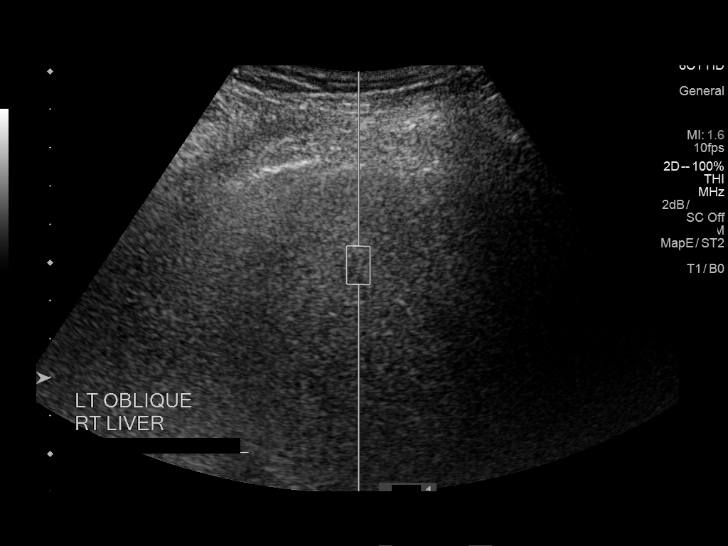
[im 6/12]
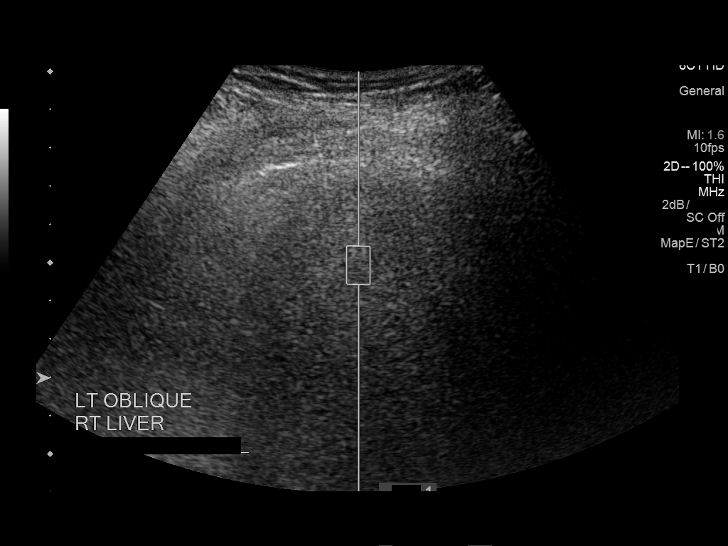
[im 7/12]
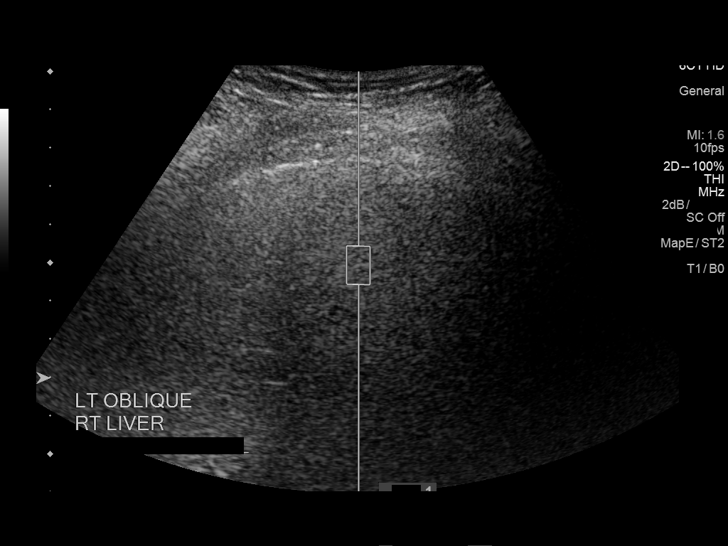
[im 8/12]
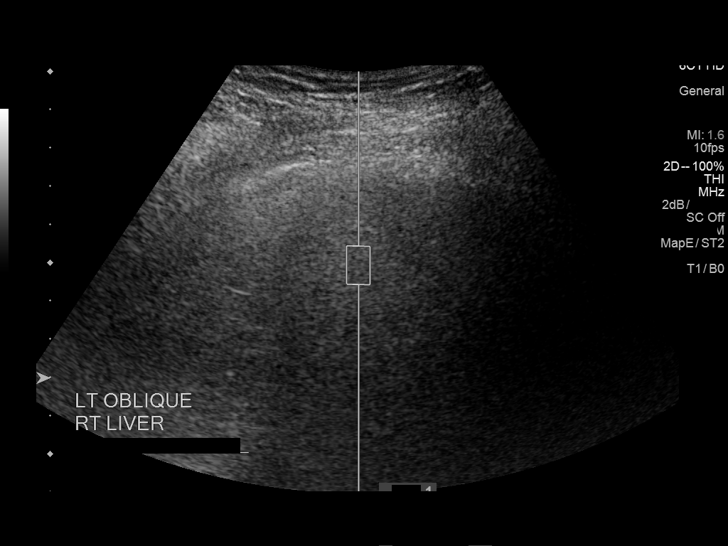
[im 9/12]
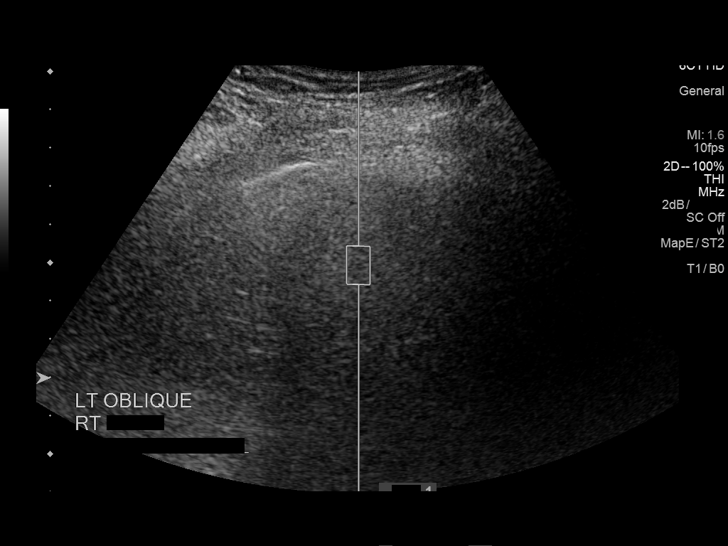
[im 10/12]
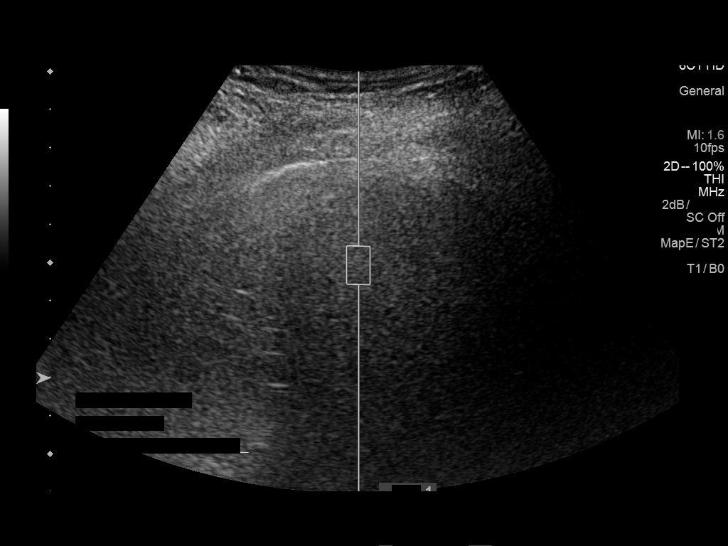
[im 11/12]
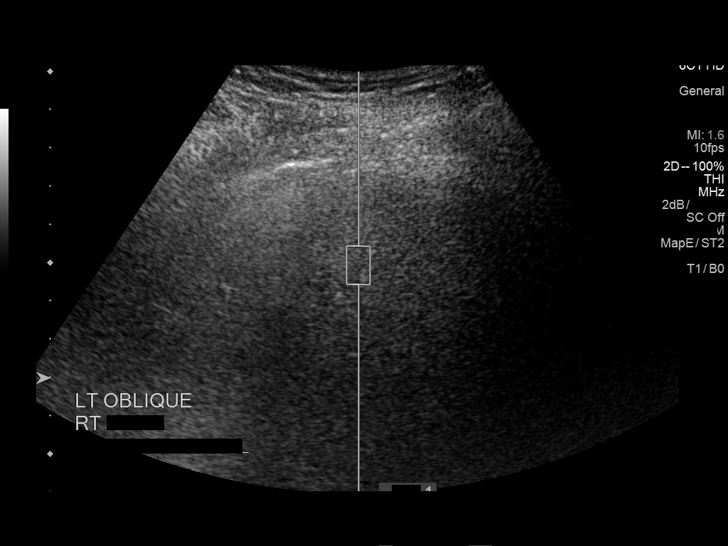
[im 12/12]
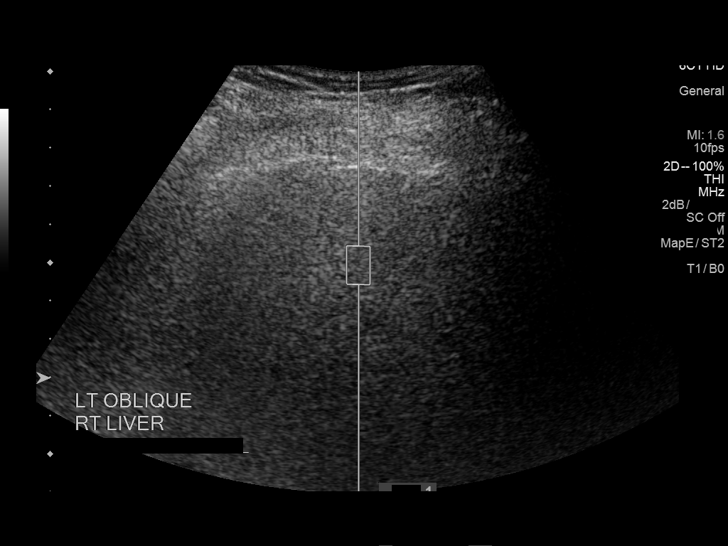

[12 of 12 positions shown; findings below may reference images not displayed]

FINDINGS: ULTRASOUND ABDOMEN

Gallbladder: Normally distended without stones or wall thickening.
No pericholecystic fluid or sonographic Murphy sign.

Common bile duct: Diameter: 5 mm diameter, normal

Liver: Heterogeneous increased echogenicity. Smooth hepatic
contours. Single hypoechoic nodule 9 x 7 x 7 mm LEFT lobe unchanged
likely cyst.. Portal vein is patent on color Doppler imaging with
normal direction of blood flow towards the liver.

IVC: Normal appearance

Pancreas: Obscured by bowel gas

Spleen: Normal appearance, 9.1 cm length

Right Kidney: Length: 10.5 cm. Normal morphology without mass or
hydronephrosis.

Left Kidney: Length: 10.7 cm. Normal morphology without mass or
hydronephrosis.

Abdominal aorta: Mid aortic bifurcation obscured by bowel gas.
Visualized aorta normal caliber

Other findings: No free fluid

ULTRASOUND HEPATIC ELASTOGRAPHY

Device: Siemens Helix VTQ

Patient position: Oblique

Transducer 6C1

Number of measurements: 10

Hepatic segment:  8

Median velocity:   0.79 m/sec

IQR:

IQR/Median velocity ratio:

Corresponding Metavir fibrosis score:  F0/F1

Risk of fibrosis: Minimal

Limitations of exam: Body habitus. Patient on metoprolol, see
paragraph below

Please note that abnormal shear wave velocities may also be
identified in clinical settings other than with hepatic fibrosis,
such as: acute hepatitis, elevated right heart and central venous
pressures including use of beta blockers, Bricelee disease
([place]), infiltrative processes such as
mastocytosis/amyloidosis/infiltrative tumor, extrahepatic
cholestasis, in the post-prandial state, and liver transplantation.
Correlation with patient history, laboratory data, and clinical
condition recommended.
IMPRESSION: ULTRASOUND ABDOMEN:

Probable fatty infiltration of liver as above.

Stable tiny 9 mm cyst LEFT lobe liver unchanged.

Nonvisualization of pancreas and mid/distal aorta.

ULTRASOUND HEPATIC ELASTOGRAPHY:

Median hepatic shear wave velocity is calculated at 0.79 m/sec.

Corresponding Metavir fibrosis score is F0/F1.

Risk of fibrosis is Minimal.

Follow-up: None required

## 2019-03-05 ENCOUNTER — Other Ambulatory Visit: Payer: Self-pay

## 2019-03-05 ENCOUNTER — Encounter: Payer: Self-pay | Admitting: Allergy and Immunology

## 2019-03-05 ENCOUNTER — Ambulatory Visit: Payer: Medicaid Other | Admitting: Allergy and Immunology

## 2019-03-05 VITALS — BP 130/80 | HR 91 | Resp 18

## 2019-03-05 DIAGNOSIS — L299 Pruritus, unspecified: Secondary | ICD-10-CM

## 2019-03-05 DIAGNOSIS — K219 Gastro-esophageal reflux disease without esophagitis: Secondary | ICD-10-CM

## 2019-03-05 DIAGNOSIS — J4530 Mild persistent asthma, uncomplicated: Secondary | ICD-10-CM

## 2019-03-05 DIAGNOSIS — J3089 Other allergic rhinitis: Secondary | ICD-10-CM | POA: Diagnosis not present

## 2019-03-05 NOTE — Progress Notes (Signed)
[facility]   Follow-up Note  Referring Provider: Sid Falcon, MD Primary Provider: Ladona Horns, MD Date of Office Visit: 03/05/2019  Subjective:   [name] [name] (DOB: 11/12/1960) is a 58 y.o. female who returns to the [facility] on 03/05/2019 in re-evaluation of the following:  HPI: Baylyn returns to this clinic in evaluation of asthma and allergic rhinoconjunctivitis and LPR and pruritus.  Her last visit to this clinic was 30 October 2018.  She has had an excellent interval of time regarding her airway.  She has not required a systemic steroid or an antibiotic to treat any type of airway issue although she still remains on a daily systemic steroid for her Addison's disease.  She rarely uses a short acting bronchodilator.  She continues to use Pulmicort just a few times per week as well as montelukast.  Her nose is really doing quite well.  She has not required an antibiotic to treat an episode of sinusitis.  She continues on Nasacort few times a week.  She has had some drainage come out of her ears particularly her left ear the past week or so for which she restarted eardrops prescribed by Dr. Erik Obey which has cleared up this issue.  Her reflux is under very good control and she has had very little issues with her throat while using PPI and a H2 receptor blocker.  Her pruritus appears to be under very good control while using cetirizine.  She did obtain the flu vaccine this year.  Allergies as of 03/05/2019      Reactions   Acetaminophen Other (See Comments)   Inflamed liver, hospitalized REACTION: Had liver problems, was hospitalized Inflamed liver, hospitalized   Morphine Hives, Rash   REACTION: rash   Triamterene Hives   Dyazide [hydrochlorothiazide W-triamterene] Hives   Lisinopril    Cough    Topamax [topiramate]    Losartan Rash   Pt had rash, worsening dizziness, and nausea after starting losartan,  which improved after stopping losartan      Medication List      amLODipine 10 MG tablet Commonly known as: NORVASC Take 1 tablet (10 mg total) by mouth daily.   atorvastatin 10 MG tablet Commonly known as: LIPITOR Take 1 tablet (10 mg total) by mouth daily.   benzonatate 100 MG capsule Commonly known as: Tessalon Perles Take 1 capsule (100 mg total) by mouth 3 (three) times daily as needed for cough.   Biktarvy 50-200-25 MG Tabs tablet Generic drug: bictegravir-emtricitabine-tenofovir AF TAKE ONE TABLET BY MOUTH DAILY   budesonide 180 MCG/ACT inhaler Commonly known as: Pulmicort Flexhaler Inhale 2 puffs into the lungs daily. Rinse, gargle, and spit after use.   cetirizine 10 MG tablet Commonly known as: ZYRTEC TAKE 1 TABLET BY MOUTH EVERY DAY AS NEEDED FOR ALLERGIES   diazepam 5 MG tablet Commonly known as: Valium Take 1-2 tablets 30 minutes prior to MRI, may repeat once as needed. Must have driver.   diphenhydrAMINE 25 MG tablet Commonly known as: Diphenhist TAKE 1 tablet AS NEEDED FOR ITCHING.   famotidine 40 MG tablet Commonly known as: PEPCID TAKE ONE TABLET BY MOUTH AT BEDTIME   hydrocortisone 10 MG tablet Commonly known as: CORTEF Take 10 mg by mouth as directed. Pt takes 10 mg in the am and 5 mg at 11 am and 3 pm   liraglutide 18 MG/3ML Sopn Commonly known as: VICTOZA Inject into the skin.  metoprolol tartrate 25 MG tablet Commonly known as: LOPRESSOR TAKE ONE TABLET BY MOUTH TWICE A DAY   montelukast 10 MG tablet Commonly known as: SINGULAIR TAKE 1 TABLET (10 MG TOTAL) BY MOUTH DAILY.   nitroGLYCERIN 0.4 MG SL tablet Commonly known as: NITROSTAT Place 1 tablet (0.4 mg total) under the tongue every 5 (five) minutes as needed for chest pain.   nortriptyline 10 MG capsule Commonly known as: PAMELOR Take 2 capsules (20 mg total) by mouth at bedtime.   pantoprazole 40 MG tablet Commonly known as: PROTONIX TAKE ONE TABLET BY MOUTH DAILY    ProAir HFA 108 (90 Base) MCG/ACT inhaler Generic drug: albuterol INHALE TWO PUFFS BY MOUTH EVERY FOUR TO SIX HOURS AS NEEDED FOR COUGH OR WHEEZE   promethazine 25 MG tablet Commonly known as: PHENERGAN Take 1 tablet (25 mg total) by mouth every 6 (six) hours as needed for nausea or vomiting.   promethazine-dextromethorphan 6.25-15 MG/5ML syrup Commonly known as: PROMETHAZINE-DM Take 5 mLs by mouth 4 (four) times daily as needed for cough.   sodium chloride 0.65 % Soln nasal spray Commonly known as: OCEAN PLACE 1 SPRAY INTO BOTH NOSTRILS AS NEEDED FOR CONGESTION.   SUMAtriptan 25 MG tablet Commonly known as: IMITREX TAKE ONE TABLET BY MOUTH EVERY TWO HOURS AS NEEDED FOR MIGRAINE. MAY REPEAT IN TWO HOURS IF HEADACHE PERSISTS OR RECURS   Vitamin D (Cholecalciferol) 25 MCG (1000 UT) Caps Take 1,000 mg by mouth daily.   Wrist/Thumb Splint/Right Med Misc Wear on R thumb/wrist.       Past Medical History:  Diagnosis Date  . Allergic rhinitis 05/09/2006  . Allergy   . Anxiety   . Arthritis   . Asthma   . CHF (congestive heart failure) ([place])   . Chronic back pain   . Diabetes mellitus without complication ([place])   . GERD (gastroesophageal reflux disease)   . Heart murmur    as a child  . 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 s/p 12 week course of simeprevir and sofosbuvir between October 2014 and January 2015 with resolution.  Marland Kitchen History of shingles   . HIV infection ([place])    1994  . Hyperlipidemia    no meds taken now  . Hypertension   . Migraine   . Pituitary microadenoma ([place]) 08/08/2014  . Prediabetes   . Refusal of blood transfusions as patient is Jehovah's Witness   . Secondary adrenal insufficiency ([place]) 06/29/2014  . Urticaria     Past Surgical History:  Procedure Laterality Date  . BUNIONECTOMY     b/l  . COLECTOMY     2003 for diverticulitis, had colostomy bag and then reversed  . COLONOSCOPY    . HAND SURGERY    .  NASAL SINUS SURGERY    . SHOULDER SURGERY     left  . TONSILLECTOMY      Review of systems negative except as noted in HPI / PMHx or noted below:  Review of Systems  Constitutional: Negative.   HENT: Negative.   Eyes: Negative.   Respiratory: Negative.   Cardiovascular: Negative.   Gastrointestinal: Negative.   Genitourinary: Negative.   Musculoskeletal: Negative.   Skin: Negative.   Neurological: Negative.   Endo/Heme/Allergies: Negative.   Psychiatric/Behavioral: Negative.      Objective:   Vitals:   03/05/19 1501  BP: 130/80  Pulse: 91  Resp: 18  SpO2: 95%  Physical Exam Constitutional:      Appearance: She is not diaphoretic.  HENT:     Head: Normocephalic.     Right Ear: Ear canal and external ear normal. Tympanic membrane is perforated.     Left Ear: Ear canal and external ear normal. Tympanic membrane is perforated.     Nose: Nose normal. No mucosal edema or rhinorrhea.     Mouth/Throat:     Pharynx: Uvula midline. No oropharyngeal exudate.  Eyes:     Conjunctiva/sclera: Conjunctivae normal.  Neck:     Thyroid: No thyromegaly.     Trachea: Trachea normal. No tracheal tenderness or tracheal deviation.  Cardiovascular:     Rate and Rhythm: Normal rate and regular rhythm.     Heart sounds: Normal heart sounds, S1 normal and S2 normal. No murmur.  Pulmonary:     Effort: No respiratory distress.     Breath sounds: Normal breath sounds. No stridor. No wheezing or rales.  Lymphadenopathy:     Head:     Right side of head: No tonsillar adenopathy.     Left side of head: No tonsillar adenopathy.     Cervical: No cervical adenopathy.  Skin:    Findings: No erythema or rash.     Nails: There is no clubbing.   Neurological:     Mental Status: She is alert.     Diagnostics:    Spirometry was performed and demonstrated an FEV1 of 1.56 at 82 % of predicted.  The patient had an Asthma Control Test with the following results: ACT Total Score:  14.    Assessment and Plan:   1. Asthma, well controlled, mild persistent   2. Other allergic rhinitis   3. LPRD (laryngopharyngeal reflux disease)   4. Pruritic disorder     1.  Continue to Treat and prevent inflammation:   A.  Montelukast 10 mg - 1 tablet 1 time per day  B.  Pulmicort 180 -2 inhalations 3-7 times a week   C.  OTC Nasacort -1 spray each nostril 3-7 times per week  2.  Continue to Treat and prevent reflux:   A.  Protonix 40 mg in a.m.  B.  Famotidine 40 mg in p.m.    3.  Can use cetirizine 10 mg -1-2 tablets 1-2 times a day (maximum 40mg )  4.  If needed:   A.  OTC Benadryl  B.  OTC nasal saline  C.  Pro Air HFA 2 puffs every 4-6 hours  D. Nasal saline  5. Hydration of skin with bath / shower followed by Vaseline while wet.  6. Return to clinic in 6 months or earlier if problem  7. Obtain Covid vaccine when available  Hildred appears to be doing quite well regarding her airway issue and reflux issue and I would like for her to remain on anti-inflammatory agents for her airway and a combination of her proton pump inhibitor and H2 receptor blocker for her reflux.  Her dosing of cetirizine is entirely up to her and she has the option of increasing up to 40 mg daily if required to treat her pruritic condition.  I will see her back in this clinic in 6 months or earlier if there is a problem.  Allena Katz, MD Allergy / Immunology [place]

## 2019-03-05 NOTE — Patient Instructions (Signed)
  1.  Continue to Treat and prevent inflammation:   A.  Montelukast 10 mg - 1 tablet 1 time per day  B.  Pulmicort 180 -2 inhalations 3-7 times a week   C.  OTC Nasacort -1 spray each nostril 3-7 times per week  2.  Continue to Treat and prevent reflux:   A.  Protonix 40 mg in a.m.  B.  Famotidine 40 mg in p.m.    3.  Can use cetirizine 10 mg -1-2 tablets 1-2 times a day (maximum 40mg )  4.  If needed:   A.  OTC Benadryl  B.  OTC nasal saline  C.  Pro Air HFA 2 puffs every 4-6 hours  D. Nasal saline  5. Hydration of skin with bath / shower followed by Vaseline while wet.  6. Return to clinic in 6 months or earlier if problem  7. Obtain Covid vaccine when available

## 2019-03-06 ENCOUNTER — Encounter: Payer: Self-pay | Admitting: Allergy and Immunology

## 2019-03-08 DIAGNOSIS — Z833 Family history of diabetes mellitus: Secondary | ICD-10-CM | POA: Diagnosis not present

## 2019-03-08 DIAGNOSIS — G4700 Insomnia, unspecified: Secondary | ICD-10-CM | POA: Diagnosis not present

## 2019-03-08 DIAGNOSIS — D352 Benign neoplasm of pituitary gland: Secondary | ICD-10-CM | POA: Diagnosis not present

## 2019-03-08 DIAGNOSIS — Z5181 Encounter for therapeutic drug level monitoring: Secondary | ICD-10-CM | POA: Diagnosis not present

## 2019-03-08 DIAGNOSIS — E119 Type 2 diabetes mellitus without complications: Secondary | ICD-10-CM | POA: Diagnosis not present

## 2019-03-08 DIAGNOSIS — E2749 Other adrenocortical insufficiency: Secondary | ICD-10-CM | POA: Diagnosis not present

## 2019-03-14 DIAGNOSIS — M19041 Primary osteoarthritis, right hand: Secondary | ICD-10-CM | POA: Diagnosis not present

## 2019-03-18 ENCOUNTER — Encounter: Payer: Self-pay | Admitting: Internal Medicine

## 2019-03-18 ENCOUNTER — Ambulatory Visit: Payer: Medicaid Other | Admitting: Internal Medicine

## 2019-03-18 ENCOUNTER — Other Ambulatory Visit: Payer: Self-pay

## 2019-03-18 DIAGNOSIS — K0889 Other specified disorders of teeth and supporting structures: Secondary | ICD-10-CM

## 2019-03-18 MED ORDER — AMOXICILLIN-POT CLAVULANATE 875-125 MG PO TABS: 1.0000 | ORAL_TABLET | Freq: Two times a day (BID) | ORAL | 0 refills | Status: AC

## 2019-03-18 MED ORDER — BENZOCAINE 10 % MT GEL: 1.0000 "application " | Freq: Two times a day (BID) | OROMUCOSAL | 0 refills | Status: AC | PRN

## 2019-03-18 NOTE — Assessment & Plan Note (Signed)
Patient presents to the clinic with complaints of dental pain. Patient states that the pain is located behind the R central incisor. Patient states she is getting dental work done on the effected tooth on March 27, 2019. She attempted to call her dentist on 11/ 20/2020, but was unable to be seen. Her pain is aggravated by chewing and eating. She states that nothing alleviates her pain. After an examination, that showed erythema and raised lesion behind the R central incisor, a 7 day course of Augmentin will be prescribed, with Orajel paste for pain control. Patient will follow up with her dentist in December, and her PCP in February.   Plan:  - Augmentin 875-125 mg BID ordered for 7 day.  - Orajel 10% ordered for pain control  - Follow up with dentist on 03/27/2019.  - F/U PCP on 06/13/2019

## 2019-03-18 NOTE — Progress Notes (Signed)
CC: Dental Abscess  HPI:  [name] is a 58 y.o., with a PMH noted below, who presents to the clinic with a dental pain. To see the management of her acute and chronic conditions, please see the A&P note under the encounter tab.   Past Medical History:  Diagnosis Date  . Allergic rhinitis 05/09/2006  . Allergy   . Anxiety   . Arthritis   . Asthma   . CHF (congestive heart failure) ([place])   . Chronic back pain   . Diabetes mellitus without complication ([place])   . GERD (gastroesophageal reflux disease)   . Heart murmur    as a child  . 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 s/p 12 week course of simeprevir and sofosbuvir between October 2014 and January 2015 with resolution.  Marland Kitchen History of shingles   . HIV infection ([place])    1994  . Hyperlipidemia    no meds taken now  . Hypertension   . Migraine   . Pituitary microadenoma ([place]) 08/08/2014  . Prediabetes   . Refusal of blood transfusions as patient is Jehovah's Witness   . Secondary adrenal insufficiency ([place]) 06/29/2014  . Urticaria    Review of Systems:   Review of Systems  Constitutional: Negative for chills, fever and malaise/fatigue.  HENT: Negative for congestion, hearing loss and sinus pain.   Eyes: Negative for blurred vision and double vision.  Respiratory: Negative for hemoptysis, shortness of breath and wheezing.   Cardiovascular: Negative for chest pain and palpitations.  Gastrointestinal: Negative for abdominal pain, constipation, diarrhea, nausea and vomiting.  Neurological: Negative for dizziness, tingling and headaches.    Physical Exam:  Vitals:   03/18/19 1347  BP: (!) 146/92  Pulse: 82  Temp: 98.1 F (36.7 C)  TempSrc: Oral  SpO2: 100%  Weight: 179 lb 14.4 oz (81.6 kg)  Height: 5\' 1"  (1.549 m)   Physical Exam Constitutional:      General: She is not in acute distress.    Appearance: Normal appearance. She is not ill-appearing or toxic-appearing.   HENT:     Head: Normocephalic and atraumatic.     Right Ear: External ear normal.     Left Ear: External ear normal.     Mouth/Throat:     Mouth: Mucous membranes are moist.     Pharynx: No oropharyngeal exudate.  Eyes:     General:        Right eye: No discharge.        Left eye: No discharge.  Cardiovascular:     Rate and Rhythm: Normal rate and regular rhythm.     Pulses: Normal pulses.     Heart sounds: Normal heart sounds. No murmur. No friction rub. No gallop.   Pulmonary:     Effort: Pulmonary effort is normal.     Breath sounds: Normal breath sounds. No wheezing, rhonchi or rales.  Abdominal:     General: Bowel sounds are normal.     Palpations: Abdomen is soft.     Tenderness: There is no abdominal tenderness. There is no guarding.  Musculoskeletal:        General: No swelling or tenderness.     Right lower leg: No edema.     Left lower leg: No edema.  Skin:    General: Skin is warm.  Neurological:     General: No focal deficit present.     Mental Status: She is alert  and oriented to person, place, and time.  Psychiatric:        Mood and Affect: Mood normal.        Behavior: Behavior normal.     Assessment & Plan:   See Encounters Tab for problem based charting.  Patient seen with Dr. Lynnae January

## 2019-03-18 NOTE — Patient Instructions (Signed)
Ms. Wiemken,  It was a pleasure meeting you today! Today we discussed your tooth pain. After an examination it does appear that you have an infection. We will prescribe you an antibiotic that you will take two times a day for seven days. Additionally, we will order you Orajel for pain relief. I hope you have a pleasant day, and I hope you feel better soon!  Sincerely,  Maudie Mercury, MD

## 2019-03-21 NOTE — Progress Notes (Signed)
Internal Medicine Clinic Attending  I saw and evaluated the patient.  I personally confirmed the key portions of the history and exam documented by Dr. Winters and I reviewed pertinent patient test results.  The assessment, diagnosis, and plan were formulated together and I agree with the documentation in the resident's note.  

## 2019-03-25 ENCOUNTER — Other Ambulatory Visit: Payer: Self-pay

## 2019-03-25 ENCOUNTER — Other Ambulatory Visit: Payer: Medicaid Other

## 2019-03-25 DIAGNOSIS — B20 Human immunodeficiency virus [HIV] disease: Secondary | ICD-10-CM

## 2019-03-26 LAB — T-HELPER CELL (CD4) - (RCID CLINIC ONLY)
CD4 % Helper T Cell: 30 % — ABNORMAL LOW (ref 33–65)
CD4 T Cell Abs: 864 /uL (ref 400–1790)

## 2019-03-27 LAB — HEMOGLOBIN A1C: Hemoglobin A1C: 6.5

## 2019-04-02 LAB — HIV-1 RNA QUANT-NO REFLEX-BLD
HIV 1 RNA Quant: 36 copies/mL — ABNORMAL HIGH
HIV-1 RNA Quant, Log: 1.56 Log copies/mL — ABNORMAL HIGH

## 2019-04-08 DIAGNOSIS — M19041 Primary osteoarthritis, right hand: Secondary | ICD-10-CM | POA: Diagnosis not present

## 2019-04-26 HISTORY — PX: HAND SURGERY: SHX662

## 2019-04-30 ENCOUNTER — Ambulatory Visit: Payer: Medicaid Other | Admitting: Physical Medicine & Rehabilitation

## 2019-04-30 ENCOUNTER — Encounter: Payer: Medicaid Other | Attending: Physical Medicine & Rehabilitation | Admitting: Physical Medicine & Rehabilitation

## 2019-04-30 ENCOUNTER — Other Ambulatory Visit: Payer: Self-pay

## 2019-04-30 ENCOUNTER — Encounter: Payer: Self-pay | Admitting: Physical Medicine & Rehabilitation

## 2019-04-30 VITALS — BP 132/86 | HR 83 | Temp 97.5°F | Ht 61.0 in | Wt 177.0 lb

## 2019-04-30 DIAGNOSIS — M533 Sacrococcygeal disorders, not elsewhere classified: Secondary | ICD-10-CM

## 2019-04-30 NOTE — Progress Notes (Signed)
Bilateral  sacroiliac injection under fluoroscopic guidance  Indication: Bilateral Low back and buttocks pain not relieved by medication management and other conservative care.  History of HIV well managed, CD4 864 on 03/25/2019  Informed consent was obtained after describing risks and benefits of the procedure with the patient, this includes bleeding, bruising, infection, paralysis and medication side effects. The patient wishes to proceed and has given written consent. The patient was placed in a prone position. The lumbar and sacral area was marked and prepped with Betadine. A 25-gauge 1-1/2 inch needle was inserted into the skin and subcutaneous tissue and 1 mL of 1% lidocaine was injected. Then a 25-gauge 3 inch spinal needle was inserted under fluoroscopic guidance into the left sacroiliac joint. AP and lateral images were utilized. Isovue 200x0.5 mL under live fluoroscopy demonstrated no intravascular uptake. Then a solution containing one ML of 6 mg per mLbetamethasone and 2 ML of 2% lidocaine MPF was injected x1.5 mL.Same procedure performed on the right with same technique and equipment.  Patient tolerated the procedure well. Post procedure instructions were given. Please see post procedure form. 

## 2019-04-30 NOTE — Progress Notes (Signed)
  PROCEDURE RECORD [facility]   Name: [name] [name] DOB:[birth year] MRN: [id number]  Date:04/30/2019  Physician: Alysia Penna, MD    Nurse/CMA:  Wessling CMA  Allergies:  Allergies  Allergen Reactions  . Acetaminophen Other (See Comments)    Inflamed liver, hospitalized REACTION: Had liver problems, was hospitalized Inflamed liver, hospitalized  . Morphine Hives and Rash    REACTION: rash  . Triamterene Hives  . Dyazide [Hydrochlorothiazide W-Triamterene] Hives  . Lisinopril     Cough   . Topamax [Topiramate]   . Losartan Rash    Pt had rash, worsening dizziness, and nausea after starting losartan, which improved after stopping losartan    Consent Signed: Yes.    Is patient diabetic? Yes.    CBG today? 150  Pregnant: No. LMP: No LMP recorded. Patient is postmenopausal. (age 52-55)  Anticoagulants: no Anti-inflammatory: no Antibiotics: no  Procedure: Bilateral Sacroiliac inj Position: Prone   Start Time: 11:15am     End Time: 11:21am Fluoro Time: 24s  RN/CMA Rilley Stash CMA Wessling, CMA    Time 1055am 11:25am    BP 132/86 132/85    Pulse 83 92    Respirations 16 16    O2 Sat 96 96    S/S 6 6    Pain Level 8/10 6/10     D/C home with Nunzio Cory, [name] & O X 3, D/C instructions reviewed, and sits independently.

## 2019-05-06 ENCOUNTER — Encounter: Payer: Self-pay | Admitting: Infectious Diseases

## 2019-05-06 ENCOUNTER — Other Ambulatory Visit: Payer: Self-pay

## 2019-05-06 ENCOUNTER — Telehealth (INDEPENDENT_AMBULATORY_CARE_PROVIDER_SITE_OTHER): Payer: Medicaid Other | Admitting: Infectious Diseases

## 2019-05-06 DIAGNOSIS — B20 Human immunodeficiency virus [HIV] disease: Secondary | ICD-10-CM

## 2019-05-06 DIAGNOSIS — T380X5D Adverse effect of glucocorticoids and synthetic analogues, subsequent encounter: Secondary | ICD-10-CM

## 2019-05-06 DIAGNOSIS — E099 Drug or chemical induced diabetes mellitus without complications: Secondary | ICD-10-CM

## 2019-05-06 DIAGNOSIS — E559 Vitamin D deficiency, unspecified: Secondary | ICD-10-CM

## 2019-05-06 NOTE — Assessment & Plan Note (Addendum)
Being replaced by PCP

## 2019-05-06 NOTE — Patient Instructions (Signed)
It was very nice to talk to you on the phone today.  I am glad to hear that you are staying well and keeping healthy.    I would like to take time to review you past history of HIV treatment but I would like to consider changing your medication to a newer option called DOVATO given your bone density findings. You have some areas with thinning bones and are on several medications that can impact this. Please continue your vitamin D supplements and your Biktarvy for now - will be in touch about changes.   Vaccines Recommended:   None at this time   Recommended Next Office Visit:   To be determined   Please continue to be well, stay away from folks that are sick, wash her hands frequently, do not touch her face or mouth and continue to take your medications.

## 2019-05-06 NOTE — Progress Notes (Signed)
Subjective:    [name], female    DOB: 1960/04/30, 59 y.o.   MRN: [id number]  Virtual Visit via Telephone Visit:  I connected with [name] [name] on 05/06/19 at  8:45 AM EST by telephone and verified that I am speaking with the correct person using two identifiers.   I discussed the limitations, risks, security and privacy concerns of performing an evaluation and management service by telephone and the availability of in person appointments. I also discussed with the patient that there may be a patient responsible charge related to this service. The patient expressed understanding and agreed to proceed.  Patient Location: Home Provider Location: [facility] office    CC: HIV follow-up care. Increased stress with brother's health lately.    HPI:  [name] [name] is a 59 y.o. female with well controlled HIV (VL 36, CD4 800) on Biktarvy. She does not miss any doses of her HAART. She is a type 2 diabetic, on chronic prednisone for Addison's disease a h/o pituitary adenoma.   Previous regimens for HIV treatment include Lexiva/Norvir/Truvada, Isentress/Truvada (rashes), Prezcobix/Truvada (rash). History of treated Hepatitis C (completed 04/2013 with -RNA)   She has had horrible sleep lately with the increased stress with her brothers health (on dialysis, broken arm, strokes and seizures and now with COVID in the hospital). She has recognized the impact this is having on her health and recruited her family to assist with decision making and discharge care arrangements. This has impacted her sleep significantly; she feels that she wakes every 2 hours if she can get to sleep. To help with stress management she journals and walks when it is not too cold out. She had her back injected for pain recently which is helpful to move around more.   She has noticed an increase in her weight with all that is going on; blood sugars are also elevated >150-200 in the AM. She continues to take chronic  prednisone for Addison's disease.   Had a fracture recently and underwent DEXA scan. Unaware of results.     Review of Systems  Constitutional: Positive for activity change, fatigue and unexpected weight change (increased with steroid use).  HENT: Negative for congestion and trouble swallowing.   Respiratory: Negative for shortness of breath and wheezing.   Cardiovascular: Negative for chest pain.  Gastrointestinal: Positive for nausea (chronic).  Musculoskeletal: Positive for back pain (chronic ).  Skin: Negative for color change.  Neurological: Negative for weakness.  Psychiatric/Behavioral: Positive for sleep disturbance.       Objective:   Physical Exam Vitals reviewed.  Pulmonary:     Effort: Pulmonary effort is normal.     Comments: No shortness of breath detected in conversation.  Neurological:     Mental Status: She is oriented to person, place, and time.  Psychiatric:        Mood and Affect: Mood normal.        Behavior: Behavior normal.        Thought Content: Thought content normal.        Judgment: Judgment normal.       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       HIV disease ([place]) (  Chronic)    She is well controlled on Biktarvy with only a mild viral blip with recent labs < 50 copies. Her CD4 has shown excellent reconstitution even on her chronic prednisone.   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 She has multiple medications that put her at risk for osteoporosis including truvada, prednisone, PPIs and post-menopausal state & vitamin D deficiency. Will consider changing her Biktarvy to Dovato to remove further tenofovir exposure.         [name], MSN, NP-C [facility]  [facility]fectious Disease [email or web address] Pager: [phone] Office: [phone] [place]: [phone]

## 2019-05-06 NOTE — Assessment & Plan Note (Signed)
She is well controlled on Biktarvy with only a mild viral blip with recent labs < 50 copies. Her CD4 has shown excellent reconstitution even on her chronic prednisone.   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 She has multiple medications that put her at risk for osteoporosis including truvada, prednisone, PPIs and post-menopausal state & vitamin D deficiency. Will consider changing her Biktarvy to Dovato to remove further tenofovir exposure.

## 2019-05-06 NOTE — Assessment & Plan Note (Signed)
She will make an appointment with her endocrinologist to discuss hyperglycemia trend.

## 2019-05-09 DIAGNOSIS — M19041 Primary osteoarthritis, right hand: Secondary | ICD-10-CM | POA: Diagnosis not present

## 2019-05-20 DIAGNOSIS — Z833 Family history of diabetes mellitus: Secondary | ICD-10-CM | POA: Diagnosis not present

## 2019-05-20 DIAGNOSIS — G4700 Insomnia, unspecified: Secondary | ICD-10-CM | POA: Diagnosis not present

## 2019-05-20 DIAGNOSIS — E119 Type 2 diabetes mellitus without complications: Secondary | ICD-10-CM | POA: Diagnosis not present

## 2019-05-20 DIAGNOSIS — D352 Benign neoplasm of pituitary gland: Secondary | ICD-10-CM | POA: Diagnosis not present

## 2019-05-20 DIAGNOSIS — Z5181 Encounter for therapeutic drug level monitoring: Secondary | ICD-10-CM | POA: Diagnosis not present

## 2019-05-20 DIAGNOSIS — E2749 Other adrenocortical insufficiency: Secondary | ICD-10-CM | POA: Diagnosis not present

## 2019-06-07 ENCOUNTER — Other Ambulatory Visit: Payer: Self-pay | Admitting: Infectious Diseases

## 2019-06-07 ENCOUNTER — Other Ambulatory Visit: Payer: Self-pay | Admitting: Allergy and Immunology

## 2019-06-07 DIAGNOSIS — H04123 Dry eye syndrome of bilateral lacrimal glands: Secondary | ICD-10-CM | POA: Diagnosis not present

## 2019-06-07 DIAGNOSIS — H1131 Conjunctival hemorrhage, right eye: Secondary | ICD-10-CM | POA: Diagnosis not present

## 2019-06-07 DIAGNOSIS — B20 Human immunodeficiency virus [HIV] disease: Secondary | ICD-10-CM

## 2019-06-11 ENCOUNTER — Other Ambulatory Visit: Payer: Self-pay

## 2019-06-11 NOTE — Telephone Encounter (Signed)
pantoprazole (PROTONIX) 40 MG tablet, refill request @  [facility], [place] [phone] (Phone) [phone] (Fax)

## 2019-06-13 ENCOUNTER — Encounter: Payer: Medicaid Other | Admitting: Internal Medicine

## 2019-06-14 MED ORDER — PANTOPRAZOLE SODIUM 40 MG PO TBEC: 40.0000 mg | DELAYED_RELEASE_TABLET | Freq: Every day | ORAL | 1 refills | Status: DC

## 2019-06-20 ENCOUNTER — Ambulatory Visit: Payer: Medicaid Other | Admitting: Internal Medicine

## 2019-06-20 ENCOUNTER — Other Ambulatory Visit: Payer: Self-pay

## 2019-06-20 ENCOUNTER — Encounter: Payer: Self-pay | Admitting: Internal Medicine

## 2019-06-20 VITALS — BP 134/87 | HR 88 | Temp 98.1°F | Ht 61.0 in | Wt 175.2 lb

## 2019-06-20 DIAGNOSIS — G4700 Insomnia, unspecified: Secondary | ICD-10-CM | POA: Diagnosis not present

## 2019-06-20 DIAGNOSIS — F411 Generalized anxiety disorder: Secondary | ICD-10-CM | POA: Insufficient documentation

## 2019-06-20 DIAGNOSIS — E099 Drug or chemical induced diabetes mellitus without complications: Secondary | ICD-10-CM | POA: Diagnosis not present

## 2019-06-20 DIAGNOSIS — F410 Panic disorder [episodic paroxysmal anxiety] without agoraphobia: Secondary | ICD-10-CM | POA: Insufficient documentation

## 2019-06-20 DIAGNOSIS — J309 Allergic rhinitis, unspecified: Secondary | ICD-10-CM | POA: Diagnosis not present

## 2019-06-20 DIAGNOSIS — E1169 Type 2 diabetes mellitus with other specified complication: Secondary | ICD-10-CM

## 2019-06-20 DIAGNOSIS — H7293 Unspecified perforation of tympanic membrane, bilateral: Secondary | ICD-10-CM | POA: Diagnosis not present

## 2019-06-20 DIAGNOSIS — E785 Hyperlipidemia, unspecified: Secondary | ICD-10-CM

## 2019-06-20 DIAGNOSIS — Z79899 Other long term (current) drug therapy: Secondary | ICD-10-CM

## 2019-06-20 DIAGNOSIS — F419 Anxiety disorder, unspecified: Secondary | ICD-10-CM | POA: Diagnosis not present

## 2019-06-20 DIAGNOSIS — I10 Essential (primary) hypertension: Secondary | ICD-10-CM

## 2019-06-20 DIAGNOSIS — T380X5D Adverse effect of glucocorticoids and synthetic analogues, subsequent encounter: Secondary | ICD-10-CM

## 2019-06-20 DIAGNOSIS — E559 Vitamin D deficiency, unspecified: Secondary | ICD-10-CM

## 2019-06-20 MED ORDER — TRAZODONE HCL 50 MG PO TABS: 50.0000 mg | ORAL_TABLET | Freq: Every evening | ORAL | 1 refills | Status: DC | PRN

## 2019-06-20 MED ORDER — ATORVASTATIN CALCIUM 10 MG PO TABS: 5.0000 mg | ORAL_TABLET | Freq: Every day | ORAL | 1 refills | Status: DC

## 2019-06-20 MED ORDER — VITAMIN D (CHOLECALCIFEROL) 25 MCG (1000 UT) PO CAPS: 1000.0000 mg | ORAL_CAPSULE | Freq: Every day | ORAL | 2 refills | Status: DC

## 2019-06-20 NOTE — Progress Notes (Signed)
   CC: anxiety  HPI:  [name] is a 59 y.o. F with significant PMH as outlined below who presents for anxiety and follow-up of her HTN. Please see problem based charting for continued management of her chronic medical conditions.   Past Medical History:  Diagnosis Date  . Allergic rhinitis 05/09/2006  . Allergy   . Anxiety   . Arthritis   . Asthma   . CHF (congestive heart failure) ([place])   . Chronic back pain   . Diabetes mellitus without complication ([place])   . GERD (gastroesophageal reflux disease)   . Heart murmur    as a child  . 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 s/p 12 week course of simeprevir and sofosbuvir between October 2014 and January 2015 with resolution.  Marland Kitchen History of shingles   . HIV infection ([place])    1994  . Hyperlipidemia    no meds taken now  . Hypertension   . Migraine   . Pituitary microadenoma ([place]) 08/08/2014  . Prediabetes   . Refusal of blood transfusions as patient is Jehovah's Witness   . Secondary adrenal insufficiency ([place]) 06/29/2014  . Urticaria    Review of Systems:   Review of Systems  Constitutional: Negative for chills and fever.  HENT: Positive for congestion and ear discharge. Negative for ear pain, sinus pain, sore throat and tinnitus.   Eyes: Negative for pain and redness.  Respiratory: Negative for shortness of breath.   Cardiovascular: Negative for chest pain and palpitations.  Psychiatric/Behavioral: Negative for depression. The patient is nervous/anxious.    Physical Exam:  Vitals:   06/20/19 1310  BP: 134/87  Pulse: 88  Temp: 98.1 F (36.7 C)  TempSrc: Oral  SpO2: 98%  Weight: 175 lb 3.2 oz (79.5 kg)  Height: 5\' 1"  (1.549 m)   Physical Exam Vitals and nursing note reviewed.  Constitutional:      General: She is not in acute distress.    Appearance: Normal appearance. She is not ill-appearing.  HENT:     Right Ear: Drainage present. Tympanic membrane is perforated.  Tympanic membrane is not erythematous.     Left Ear: Decreased hearing noted. Drainage present. Tympanic membrane is perforated. Tympanic membrane is not erythematous.     Ears:     Comments: Bilateral erythematous canals (R>L) with associated nonpurulent drainage. Pt with history of prior PE tubes that have since come out leaving her with perforated TMs bilaterally.    Nose: Nose normal.     Mouth/Throat:     Mouth: Mucous membranes are moist.     Pharynx: No oropharyngeal exudate or posterior oropharyngeal erythema.  Cardiovascular:     Rate and Rhythm: Normal rate and regular rhythm.     Heart sounds: Normal heart sounds.  Pulmonary:     Effort: Pulmonary effort is normal. No respiratory distress.     Breath sounds: Normal breath sounds.  Abdominal:     General: Abdomen is flat. Bowel sounds are normal.     Palpations: Abdomen is soft.  Neurological:     Mental Status: She is alert.    Assessment & Plan:   See Encounters Tab for problem based charting.  Patient discussed with Dr. Philipp Ovens

## 2019-06-20 NOTE — Patient Instructions (Signed)
[name] [name],  It was nice see you today!   For your insomnia - please take Trazodone as need at bedtime to help you sleep.  Schedule follow-up with your allergist for cough and seasonal allergies. Continue to take your prescription ear drops as needed for the drainage.  Schedule follow-up with your endocrinologist for your diabetes check-up and medication adjustment of your steroids.  You can follow-up with me in 6 months for a check up and blood work.

## 2019-06-21 NOTE — Assessment & Plan Note (Signed)
Pt taking metoprolol 25mg  BID and amlodipine 10mg  daily. Denies chest pain, shortness of breath, lightheadedness/dizziness, or other medication side effects. BP today mildly elevated to 134/87.  - continue metoprolol and amlodipine as above - continue to monitor - on chart review, pt has reported intolerance to lisinopril/losartan/HCTZ/sprinocolactone in the past  BP Readings from Last 3 Encounters:  06/20/19 134/87  04/30/19 132/86  03/18/19 (!) 146/92

## 2019-06-21 NOTE — Assessment & Plan Note (Signed)
Pt having a 1 week history of ear drainage and congestion. Has been taking cetirizine, benadryl, and nasal saline spray as needed at home. Denies fevers, chills, sore throat, cough, shortness of breath, or chest pain. She is followed by Dr. Neldon Mc for her asthma and allergies. He had previously prescribed antibiotic ear drops for when pt has increased drainage, and she has been taking these for 5 days with some improvement in her symptoms. On exam, pt has nonpurulent drainage and erythema (R > L) of the bilateral ear canals. No associated lymphadenopathy, rhinorrhea, or oropharyngeal exudate/erythema.   - instructed pt to continue ear drops - follow-up with Dr. Neldon Mc if symptoms worsen or persistent as she may need a change in her allergy medication regimen vs oral antibiotic course - had prior referral to ENT in 2019 for eustachian tube dysfunction and potential PE tube replacement, could consider follow-up with them again if problem persists

## 2019-06-21 NOTE — Assessment & Plan Note (Signed)
Pt with steroid induced diabetes mellitus. Managed by endocrinology, last seen by Dr. Buddy Duty on 05/21/2019. A1c on 03/08/2019 was 6.5.  Currently on Victoza 1.2mg  daily. Normal Cr of 0.88 on 05/14/2018. Normal microalbumin/creatine ratio of 9.4 on 05/14/2018. Normal diabetic foot exam on 03/08/2019. Will follow along with endocrine per chart review.

## 2019-06-21 NOTE — Assessment & Plan Note (Signed)
Pt having increasing anxiety over the last 3 months. Attributes this to the stress related to her brothers health as he has been in and out of the hospital. She coordinates a lot of his care and assists in decision making. Pt states she is leaning on her other siblings, friends, and church members for help and support. PHQ-9 was 6 today. Her anxiety manifests itself most during the nighttime causing her to be unable to fall asleep for 3-5 nights per week. Takes nortriptyline and benadryl at bedtime normally, and these do not help. Pt previously took Valium PRN at bedtime and is requesting that again.  Of note, pt was endorsing anxiety symptoms during her last endocrine appt so her prednisone was decreased to 5mg  BID. Pt states this dose made her feel too tired by the mid-afternoon, so she has been taking a 3rd 5mg  dose around 2-3pm.  - will begin trazodone 50mg  qhs PRN to help with insomnia - avoid benzodiazepines given risk of dependence - pt deferred treatment with an SSRI or in clinic counseling at this time - pt to follow-up with endocrinology in approximately 4 weeks for continued steroid titration

## 2019-06-24 NOTE — Progress Notes (Signed)
Internal Medicine Clinic Attending  Case discussed with Dr. Jones at the time of the visit.  We reviewed the resident's history and exam and pertinent patient test results.  I agree with the assessment, diagnosis, and plan of care documented in the resident's note.  

## 2019-07-01 DIAGNOSIS — H6983 Other specified disorders of Eustachian tube, bilateral: Secondary | ICD-10-CM | POA: Diagnosis not present

## 2019-07-01 DIAGNOSIS — H7202 Central perforation of tympanic membrane, left ear: Secondary | ICD-10-CM | POA: Diagnosis not present

## 2019-07-01 DIAGNOSIS — H663X3 Other chronic suppurative otitis media, bilateral: Secondary | ICD-10-CM | POA: Diagnosis not present

## 2019-07-01 DIAGNOSIS — H938X1 Other specified disorders of right ear: Secondary | ICD-10-CM | POA: Diagnosis not present

## 2019-07-01 DIAGNOSIS — L299 Pruritus, unspecified: Secondary | ICD-10-CM | POA: Diagnosis not present

## 2019-07-01 DIAGNOSIS — H7291 Unspecified perforation of tympanic membrane, right ear: Secondary | ICD-10-CM | POA: Diagnosis not present

## 2019-07-15 ENCOUNTER — Other Ambulatory Visit: Payer: Self-pay | Admitting: Neurology

## 2019-07-16 ENCOUNTER — Other Ambulatory Visit: Payer: Self-pay | Admitting: *Deleted

## 2019-07-16 MED ORDER — METOPROLOL TARTRATE 25 MG PO TABS: 25.0000 mg | ORAL_TABLET | Freq: Two times a day (BID) | ORAL | 1 refills | Status: DC

## 2019-07-18 DIAGNOSIS — D352 Benign neoplasm of pituitary gland: Secondary | ICD-10-CM | POA: Diagnosis not present

## 2019-07-18 DIAGNOSIS — E2749 Other adrenocortical insufficiency: Secondary | ICD-10-CM | POA: Diagnosis not present

## 2019-07-18 DIAGNOSIS — Z79899 Other long term (current) drug therapy: Secondary | ICD-10-CM | POA: Diagnosis not present

## 2019-07-18 DIAGNOSIS — Z7189 Other specified counseling: Secondary | ICD-10-CM | POA: Diagnosis not present

## 2019-07-18 DIAGNOSIS — E119 Type 2 diabetes mellitus without complications: Secondary | ICD-10-CM | POA: Diagnosis not present

## 2019-07-18 DIAGNOSIS — Z833 Family history of diabetes mellitus: Secondary | ICD-10-CM | POA: Diagnosis not present

## 2019-07-22 ENCOUNTER — Ambulatory Visit: Payer: Medicaid Other | Attending: Internal Medicine

## 2019-07-22 DIAGNOSIS — Z23 Encounter for immunization: Secondary | ICD-10-CM

## 2019-07-22 NOTE — Progress Notes (Signed)
   Covid-19 Vaccination Clinic  Name:  [name] [name]    MRN: [id number] DOB: [birth year]  07/22/2019  [name] was observed post Covid-19 immunization for 15 minutes without incident. She was provided with Vaccine Information Sheet and instruction to access the V-Safe system.   [name] was instructed to call 911 with any severe reactions post vaccine: Marland Kitchen Difficulty breathing  . Swelling of face and throat  . A fast heartbeat  . A bad rash all over body  . Dizziness and weakness   Immunizations Administered    Name Date Dose VIS Date Route   Pfizer COVID-19 Vaccine 07/22/2019 10:11 AM 0.3 mL 04/05/2019 Intramuscular   Manufacturer: [place]   Lot: [id number]   [place]: [id number]

## 2019-07-30 ENCOUNTER — Encounter: Payer: Medicaid Other | Attending: Physical Medicine & Rehabilitation | Admitting: Physical Medicine & Rehabilitation

## 2019-07-30 ENCOUNTER — Encounter: Payer: Self-pay | Admitting: Physical Medicine & Rehabilitation

## 2019-07-30 ENCOUNTER — Other Ambulatory Visit: Payer: Self-pay

## 2019-07-30 VITALS — BP 143/91 | HR 86 | Temp 97.2°F | Ht 61.0 in | Wt 174.0 lb

## 2019-07-30 DIAGNOSIS — M533 Sacrococcygeal disorders, not elsewhere classified: Secondary | ICD-10-CM | POA: Diagnosis not present

## 2019-07-30 NOTE — Progress Notes (Signed)
Bilateral  sacroiliac injection under fluoroscopic guidance  Indication: Bilateral Low back and buttocks pain not relieved by medication management and other conservative care.  History of HIV well managed, CD4 864 on 03/25/2019  Informed consent was obtained after describing risks and benefits of the procedure with the patient, this includes bleeding, bruising, infection, paralysis and medication side effects. The patient wishes to proceed and has given written consent. The patient was placed in a prone position. The lumbar and sacral area was marked and prepped with Betadine. A 25-gauge 1-1/2 inch needle was inserted into the skin and subcutaneous tissue and 1 mL of 1% lidocaine was injected. Then a 25-gauge 3 inch spinal needle was inserted under fluoroscopic guidance into the left sacroiliac joint. AP and lateral images were utilized. Isovue 200x0.5 mL under live fluoroscopy demonstrated no intravascular uptake. Then a solution containing one ML of 6 mg per mLbetamethasone and 2 ML of 2% lidocaine MPF was injected x1.5 mL.Same procedure performed on the right with same technique and equipment.  Patient tolerated the procedure well. Post procedure instructions were given. Please see post procedure form. 

## 2019-07-30 NOTE — Progress Notes (Signed)
  PROCEDURE RECORD [facility]   Name: TAFFIE WERNIMONT DOB:19-Jul-1960 MRN: [id number]  Date:07/30/2019  Physician: Alysia Penna, MD    Nurse/CMA: Betti Cruz  Allergies:  Allergies  Allergen Reactions  . Acetaminophen Other (See Comments)    Inflamed liver, hospitalized REACTION: Had liver problems, was hospitalized Inflamed liver, hospitalized  . Morphine Hives and Rash    REACTION: rash  . Triamterene Hives  . Dyazide [Hydrochlorothiazide W-Triamterene] Hives  . Lisinopril     Cough   . Topamax [Topiramate]   . Losartan Rash    Pt had rash, worsening dizziness, and nausea after starting losartan, which improved after stopping losartan    Consent Signed: Yes.    Is patient diabetic? Yes.    CBG today? 101  Pregnant: No. LMP: No LMP recorded. Patient is postmenopausal. (age 72-55)  Anticoagulants: no Anti-inflammatory: yes (motrin) Antibiotics: no  Procedure: Bilateral Sacroiliac Injection Position: Prone Start Time: 11:22am End Time: 11:28am Fluoro Time: 34  RN/CMA Truman Hayward, CMA Bryleigh Ottaway, CMA    Time 11:07am 11:33am    BP 143/91 138/86    Pulse 86 93    Respirations 16 16    O2 Sat 98 100    S/S 6 6    Pain Level 9/10 7/10     D/C home with sister in law [place], [name] & O X 3, D/C instructions reviewed, and sits independently.

## 2019-07-30 NOTE — Patient Instructions (Signed)
Sacroiliac injection was performed today. A combination of a naming medicine plus a cortisone medicine was injected. The injection was done under x-ray guidance. This procedure has been performed to help reduce low back and buttocks pain as well as potentially hip pain. The duration of this injection is variable lasting from hours to  Months. It may repeated if needed. 

## 2019-08-01 ENCOUNTER — Other Ambulatory Visit: Payer: Self-pay | Admitting: Internal Medicine

## 2019-08-13 ENCOUNTER — Ambulatory Visit: Payer: Medicaid Other | Attending: Internal Medicine

## 2019-08-13 DIAGNOSIS — Z23 Encounter for immunization: Secondary | ICD-10-CM

## 2019-08-13 NOTE — Progress Notes (Signed)
   Covid-19 Vaccination Clinic  Name:  VELITA MACHIDA    MRN: [id number] DOB: [birth year]  08/13/2019  Ms. Rommel was observed post Covid-19 immunization for 15 minutes without incident. She was provided with Vaccine Information Sheet and instruction to access the V-Safe system.   Ms. Dombrowski was instructed to call 911 with any severe reactions post vaccine: Marland Kitchen Difficulty breathing  . Swelling of face and throat  . A fast heartbeat  . A bad rash all over body  . Dizziness and weakness   Immunizations Administered    Name Date Dose VIS Date Route   Pfizer COVID-19 Vaccine 08/13/2019 12:20 PM 0.3 mL 06/19/2018 Intramuscular   Manufacturer: [place]   Lot: [id number]   [place]: [id number]

## 2019-08-14 ENCOUNTER — Encounter: Payer: Self-pay | Admitting: *Deleted

## 2019-09-03 ENCOUNTER — Other Ambulatory Visit: Payer: Self-pay

## 2019-09-03 ENCOUNTER — Other Ambulatory Visit: Payer: Self-pay | Admitting: Allergy and Immunology

## 2019-09-03 ENCOUNTER — Other Ambulatory Visit: Payer: Self-pay | Admitting: Neurology

## 2019-09-03 ENCOUNTER — Other Ambulatory Visit: Payer: Self-pay | Admitting: Internal Medicine

## 2019-09-03 ENCOUNTER — Ambulatory Visit: Payer: Medicaid Other | Admitting: Allergy and Immunology

## 2019-09-03 ENCOUNTER — Encounter: Payer: Self-pay | Admitting: Allergy and Immunology

## 2019-09-03 VITALS — BP 124/82 | HR 94 | Temp 97.4°F | Resp 16

## 2019-09-03 DIAGNOSIS — J4540 Moderate persistent asthma, uncomplicated: Secondary | ICD-10-CM | POA: Diagnosis not present

## 2019-09-03 DIAGNOSIS — L299 Pruritus, unspecified: Secondary | ICD-10-CM

## 2019-09-03 DIAGNOSIS — K219 Gastro-esophageal reflux disease without esophagitis: Secondary | ICD-10-CM

## 2019-09-03 DIAGNOSIS — J3089 Other allergic rhinitis: Secondary | ICD-10-CM

## 2019-09-03 MED ORDER — PROAIR HFA 108 (90 BASE) MCG/ACT IN AERS: INHALATION_SPRAY | RESPIRATORY_TRACT | 1 refills | Status: DC

## 2019-09-03 MED ORDER — PULMICORT FLEXHALER 180 MCG/ACT IN AEPB: INHALATION_SPRAY | RESPIRATORY_TRACT | 5 refills | Status: DC

## 2019-09-03 MED ORDER — FAMOTIDINE 40 MG PO TABS: 40.0000 mg | ORAL_TABLET | Freq: Every day | ORAL | 2 refills | Status: DC

## 2019-09-03 MED ORDER — TRIAMCINOLONE ACETONIDE 55 MCG/ACT NA AERO
INHALATION_SPRAY | NASAL | 5 refills | Status: DC
Start: 2019-09-03 — End: 2020-08-25

## 2019-09-03 MED ORDER — MONTELUKAST SODIUM 10 MG PO TABS
10.0000 mg | ORAL_TABLET | Freq: Every day | ORAL | 5 refills | Status: DC
Start: 2019-09-03 — End: 2020-02-04

## 2019-09-03 NOTE — Patient Instructions (Addendum)
  1.  Continue to Treat and prevent inflammation:   A.  Montelukast 10 mg - 1 tablet 1 time per day  B.  RESTART Pulmicort 180 -2 inhalations 2 times per day   C.  RESTART Nasacort -1 spray each nostril 2 times per day  2.  Continue to Treat and prevent reflux:   A.  Protonix 40 mg in a.m.  B.  Famotidine 40 mg in p.m.    3.  Can use cetirizine 10 mg -1-2 tablets 1-2 times a day (maximum 40mg )  4.  If needed:   A.  OTC Benadryl  B.  OTC nasal saline  C.  Pro Air HFA 2 puffs every 4-6 hours  D.  Nasal saline  5. Hydration of skin with bath / shower followed by Vaseline while wet.  6. Return to clinic in 4 weeks or earlier if problem

## 2019-09-03 NOTE — Progress Notes (Signed)
[facility]   Follow-up Note  Referring Provider: Ladona Horns, MD Primary Provider: Ladona Horns, MD Date of Office Visit: 09/03/2019  Subjective:   [name] [name] (DOB: Sep 09, 1960) is a 59 y.o. female who returns to the [facility] on 09/03/2019 in re-evaluation of the following:  HPI: [name] returns to this clinic in reevaluation of asthma and allergic rhinitis and LPR and pruritus.  Her last visit to this clinic was 05 March 2019.  Since the spring has arrived she has had a little bit more problem with coughing especially if she goes outdoors.  For some reason she is not using her Pulmicort at this point.  She has been using her bronchodilator twice a day.  Her nose has been doing okay at this point in time.  She has not really been using much nasal steroid.  Her reflux is under very good control at this point while using a combination of her proton pump inhibitor and H2 receptor blocker.  Since I have seen her in this clinic she apparently has not required a systemic steroid or an antibiotic for any type of airway issue.  She still continues to remain with significant pruritus.  She is only using her Zyrtec 1 time per day.  Allergies as of 09/03/2019      Reactions   Acetaminophen Other (See Comments)   Inflamed liver, hospitalized REACTION: Had liver problems, was hospitalized Inflamed liver, hospitalized   Morphine Hives, Rash   REACTION: rash   Triamterene Hives   Dyazide [hydrochlorothiazide W-triamterene] Hives   Lisinopril    Cough    Topamax [topiramate]    Losartan Rash   Pt had rash, worsening dizziness, and nausea after starting losartan, which improved after stopping losartan      Medication List      amLODipine 10 MG tablet Commonly known as: NORVASC Take 1 tablet (10 mg total) by mouth daily.   atorvastatin 10 MG tablet Commonly known as: LIPITOR Take 0.5 tablets (5 mg total) by mouth  daily.   Biktarvy 50-200-25 MG Tabs tablet Generic drug: bictegravir-emtricitabine-tenofovir AF TAKE ONE TABLET BY MOUTH DAILY   budesonide 180 MCG/ACT inhaler Commonly known as: Pulmicort Flexhaler Inhale 2 puffs into the lungs daily. Rinse, gargle, and spit after use.   cetirizine 10 MG tablet Commonly known as: ZYRTEC TAKE 1 TABLET BY MOUTH EVERY DAY AS NEEDED FOR ALLERGIES   diazepam 5 MG tablet Commonly known as: Valium Take 1-2 tablets 30 minutes prior to MRI, may repeat once as needed. Must have driver.   diphenhydrAMINE 25 MG tablet Commonly known as: Diphenhist TAKE 1 tablet AS NEEDED FOR ITCHING.   famotidine 40 MG tablet Commonly known as: PEPCID TAKE ONE TABLET BY MOUTH AT BEDTIME   hydrocortisone 10 MG tablet Commonly known as: CORTEF Take 10 mg by mouth as directed. Pt takes 10 mg in the am and 5 mg at 11 am and 3 pm   liraglutide 18 MG/3ML Sopn Commonly known as: VICTOZA Inject into the skin.   metoprolol tartrate 25 MG tablet Commonly known as: LOPRESSOR Take 1 tablet (25 mg total) by mouth 2 (two) times daily.   NEOMYCIN-POLYMYXIN-HYDROCORTISONE 1 % Soln OTIC solution Commonly known as: CORTISPORIN PLACE THREE DROPS INTO THE LEFT EAR THREE TIMES DAILY FOR SEVEN DAYS.   nitroGLYCERIN 0.4 MG SL tablet Commonly known as: NITROSTAT Place 1 tablet (0.4 mg total) under the tongue every 5 (five) minutes as  needed for chest pain.   nortriptyline 10 MG capsule Commonly known as: PAMELOR Take 2 capsules (20 mg total) by mouth at bedtime.   pantoprazole 40 MG tablet Commonly known as: PROTONIX Take 1 tablet (40 mg total) by mouth daily.   ProAir HFA 108 (90 Base) MCG/ACT inhaler Generic drug: albuterol INHALE TWO PUFFS BY MOUTH EVERY FOUR TO SIX HOURS AS NEEDED FOR COUGH OR WHEEZE   promethazine 25 MG tablet Commonly known as: PHENERGAN Take 1 tablet (25 mg total) by mouth every 6 (six) hours as needed for nausea or vomiting.     promethazine-dextromethorphan 6.25-15 MG/5ML syrup Commonly known as: PROMETHAZINE-DM Take 5 mLs by mouth 4 (four) times daily as needed for cough.   sodium chloride 0.65 % Soln nasal spray Commonly known as: OCEAN PLACE 1 SPRAY INTO BOTH NOSTRILS AS NEEDED FOR CONGESTION.   SUMAtriptan 25 MG tablet Commonly known as: IMITREX TAKE ONE TABLET BY MOUTH EVERY TWO HOURS AS NEEDED FOR MIGRAINE. MAY REPEAT IN TWO HOURS IF HEADACHE PERSISTS OR RECURS   traZODone 50 MG tablet Commonly known as: DESYREL Take 1 tablet (50 mg total) by mouth at bedtime as needed for sleep.   Vitamin D (Cholecalciferol) 25 MCG (1000 UT) Caps Take 1,000 mg by mouth daily.   Wrist/Thumb Splint/Right Med Misc Wear on R thumb/wrist.       Past Medical History:  Diagnosis Date  . Allergic rhinitis 05/09/2006  . Allergy   . Anxiety   . Arthritis   . Asthma   . CHF (congestive heart failure) ([place])   . Chronic back pain   . Diabetes mellitus without complication ([place])   . GERD (gastroesophageal reflux disease)   . Heart murmur    as a child  . 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 s/p 12 week course of simeprevir and sofosbuvir between October 2014 and January 2015 with resolution.  Marland Kitchen History of shingles   . HIV infection ([place])    1994  . Hyperlipidemia    no meds taken now  . Hypertension   . Migraine   . Pituitary microadenoma ([place]) 08/08/2014  . Prediabetes   . Refusal of blood transfusions as patient is Jehovah's Witness   . Secondary adrenal insufficiency ([place]) 06/29/2014  . Urticaria     Past Surgical History:  Procedure Laterality Date  . BUNIONECTOMY     b/l  . COLECTOMY     2003 for diverticulitis, had colostomy bag and then reversed  . COLONOSCOPY    . HAND SURGERY    . NASAL SINUS SURGERY    . SHOULDER SURGERY     left  . TONSILLECTOMY      Review of systems negative except as noted in HPI / PMHx or noted below:  Review of Systems   Constitutional: Negative.   HENT: Negative.   Eyes: Negative.   Respiratory: Negative.   Cardiovascular: Negative.   Gastrointestinal: Negative.   Genitourinary: Negative.   Musculoskeletal: Negative.   Skin: Negative.   Neurological: Negative.   Endo/Heme/Allergies: Negative.   Psychiatric/Behavioral: Negative.      Objective:   Vitals:   09/03/19 1515  BP: 124/82  Pulse: 94  Resp: 16  Temp: (!) 97.4 F (36.3 C)  SpO2: 99%          Physical Exam Constitutional:      Appearance: She is not diaphoretic.  HENT:     Head: Normocephalic.  Right Ear: Tympanic membrane, ear canal and external ear normal.     Left Ear: Tympanic membrane, ear canal and external ear normal.     Nose: Nose normal. No mucosal edema or rhinorrhea.     Mouth/Throat:     Pharynx: Uvula midline. No oropharyngeal exudate.  Eyes:     Conjunctiva/sclera: Conjunctivae normal.  Neck:     Thyroid: No thyromegaly.     Trachea: Trachea normal. No tracheal tenderness or tracheal deviation.  Cardiovascular:     Rate and Rhythm: Normal rate and regular rhythm.     Heart sounds: Normal heart sounds, S1 normal and S2 normal. No murmur.  Pulmonary:     Effort: No respiratory distress.     Breath sounds: Normal breath sounds. No stridor. No wheezing or rales.  Lymphadenopathy:     Head:     Right side of head: No tonsillar adenopathy.     Left side of head: No tonsillar adenopathy.     Cervical: No cervical adenopathy.  Skin:    Findings: No erythema or rash.     Nails: There is no clubbing.  Neurological:     Mental Status: She is alert.     Diagnostics:    Spirometry was performed and demonstrated an FEV1 of 1.35 at 71 % of predicted.  The patient had an Asthma Control Test with the following results: ACT Total Score: 22.    Assessment and Plan:   1. Not well controlled moderate persistent asthma   2. Other allergic rhinitis   3. LPRD (laryngopharyngeal reflux disease)   4. Pruritic  disorder      1.  Continue to Treat and prevent inflammation:   A.  Montelukast 10 mg - 1 tablet 1 time per day  B.  RESTART Pulmicort 180 -2 inhalations 2 times per day   C.  RESTART Nasacort -1 spray each nostril 2 times per day  2.  Continue to Treat and prevent reflux:   A.  Protonix 40 mg in a.m.  B.  Famotidine 40 mg in p.m.    3.  Can use cetirizine 10 mg -1-2 tablets 1-2 times a day (maximum 40mg )  4.  If needed:   A.  OTC Benadryl  B.  OTC nasal saline  C.  Pro Air HFA 2 puffs every 4-6 hours  D.  Nasal saline  5. Hydration of skin with bath / shower followed by Vaseline while wet.  6. Return to clinic in 4 weeks or earlier if problem  I am going to have [facility] restart anti-inflammatory agents for her airway utilized on a consistent basis over the course of the next 4 weeks and will see what type of response we received without effect.  I am going to hold off on any systemic steroid administration given the fact that she does have diabetes.  Her reflux appears to be doing relatively well and she can remain on her current plan.  Her pruritus needs to be treated with an increased dose of cetirizine and I talked her about increasing her dose to 20 or 40 mg daily.  Allena Katz, MD Allergy / Immunology [place]

## 2019-09-04 ENCOUNTER — Encounter: Payer: Self-pay | Admitting: Allergy and Immunology

## 2019-09-12 ENCOUNTER — Other Ambulatory Visit: Payer: Self-pay | Admitting: Internal Medicine

## 2019-09-16 ENCOUNTER — Other Ambulatory Visit: Payer: Self-pay | Admitting: Internal Medicine

## 2019-09-16 NOTE — Telephone Encounter (Signed)
Pt calling back needing a PA on her medication listed below.   liraglutide (VICTOZA) 18 MG/3ML [place], [place]

## 2019-09-16 NOTE — Telephone Encounter (Signed)
Refill Request  pantoprazole (PROTONIX) 40 MG tablet   [facility] - [place], [place]

## 2019-09-17 ENCOUNTER — Encounter: Payer: Self-pay | Admitting: *Deleted

## 2019-09-17 ENCOUNTER — Telehealth: Payer: Self-pay | Admitting: *Deleted

## 2019-09-17 NOTE — Telephone Encounter (Signed)
Call to patient asked who prescribed the Victoza.  It was prescribed by Dr. Buddy Duty.  Will be unable to do a PA on a medication that was prescribed by a doctor not a part of the [facility].  Patient informed and will call Dr. Cindra Eves office.

## 2019-09-17 NOTE — Telephone Encounter (Signed)
Pt calling back needing a PA on her medication listed below.   liraglutide (VICTOZA) 18 MG/3ML [place], [place]

## 2019-09-18 ENCOUNTER — Other Ambulatory Visit: Payer: Self-pay | Admitting: Internal Medicine

## 2019-09-18 ENCOUNTER — Other Ambulatory Visit: Payer: Self-pay | Admitting: Allergy and Immunology

## 2019-09-18 ENCOUNTER — Other Ambulatory Visit: Payer: Self-pay | Admitting: Infectious Diseases

## 2019-09-18 DIAGNOSIS — B20 Human immunodeficiency virus [HIV] disease: Secondary | ICD-10-CM

## 2019-10-01 ENCOUNTER — Ambulatory Visit (INDEPENDENT_AMBULATORY_CARE_PROVIDER_SITE_OTHER): Payer: Medicaid Other | Admitting: Allergy and Immunology

## 2019-10-01 ENCOUNTER — Other Ambulatory Visit: Payer: Self-pay

## 2019-10-01 DIAGNOSIS — J3089 Other allergic rhinitis: Secondary | ICD-10-CM | POA: Diagnosis not present

## 2019-10-01 DIAGNOSIS — K219 Gastro-esophageal reflux disease without esophagitis: Secondary | ICD-10-CM | POA: Diagnosis not present

## 2019-10-01 DIAGNOSIS — L299 Pruritus, unspecified: Secondary | ICD-10-CM | POA: Diagnosis not present

## 2019-10-01 DIAGNOSIS — J4540 Moderate persistent asthma, uncomplicated: Secondary | ICD-10-CM

## 2019-10-01 MED ORDER — CETIRIZINE HCL 10 MG PO TABS: ORAL_TABLET | ORAL | 6 refills | Status: DC

## 2019-10-01 NOTE — Progress Notes (Signed)
[facility]   Follow-up Note  Referring Provider: Ladona Horns, MD Primary Provider: Ladona Horns, MD Date of Office Visit: 10/01/2019  Subjective:   PEACE NOYES (DOB: Jul 13, 1960) is a 59 y.o. female who returns to the [facility] on 10/01/2019 in re-evaluation of the following:  HPI: Kody returns to this clinic in evaluation of asthma and allergic rhinitis and LPR and a pruritic disorder addressed during her last evaluation of 03 Sep 2019.  During her last visit she was having issues with wheezing and coughing and using her bronchodilator twice a day and we had her reinitiate use of Pulmicort and as well restart her nasal steroid for she was having some issues with some nasal congestion.  She had a very good response to this approach and has had almost complete resolution of her coughing and her bronchodilator requirement has decreased from daily use to about 3 times per week.  Her reflux still remains under excellent control on her therapy.  She still has significant pruritus even while using 20 mg of cetirizine per day  Allergies as of 10/01/2019      Reactions   Acetaminophen Other (See Comments)   Inflamed liver, hospitalized REACTION: Had liver problems, was hospitalized Inflamed liver, hospitalized   Morphine Hives, Rash   REACTION: rash   Triamterene Hives   Dyazide [hydrochlorothiazide W-triamterene] Hives   Lisinopril    Cough    Topamax [topiramate]    Losartan Rash   Pt had rash, worsening dizziness, and nausea after starting losartan, which improved after stopping losartan      Medication List      amLODipine 10 MG tablet Commonly known as: NORVASC TAKE ONE TABLET BY MOUTH DAILY   atorvastatin 10 MG tablet Commonly known as: LIPITOR Take 0.5 tablets (5 mg total) by mouth daily.   Biktarvy 50-200-25 MG Tabs tablet Generic drug: bictegravir-emtricitabine-tenofovir AF TAKE ONE TABLET BY MOUTH  DAILY   cetirizine 10 MG tablet Commonly known as: ZYRTEC TAKE 1 TABLET BY MOUTH EVERY DAY AS NEEDED FOR ALLERGIES   diazepam 5 MG tablet Commonly known as: Valium Take 1-2 tablets 30 minutes prior to MRI, may repeat once as needed. Must have driver.   diphenhydrAMINE 25 MG tablet Commonly known as: Diphenhist TAKE 1 tablet AS NEEDED FOR ITCHING.   famotidine 40 MG tablet Commonly known as: PEPCID Take 1 tablet (40 mg total) by mouth at bedtime.   hydrocortisone 10 MG tablet Commonly known as: CORTEF Take 10 mg by mouth as directed. Pt takes 10 mg in the am and 5 mg at 11 am and 3 pm   liraglutide 18 MG/3ML Sopn Commonly known as: VICTOZA Inject into the skin.   metoprolol tartrate 25 MG tablet Commonly known as: LOPRESSOR TAKE ONE TABLET BY MOUTH TWICE A DAY   montelukast 10 MG tablet Commonly known as: SINGULAIR Take 1 tablet (10 mg total) by mouth at bedtime.   NEOMYCIN-POLYMYXIN-HYDROCORTISONE 1 % Soln OTIC solution Commonly known as: CORTISPORIN PLACE THREE DROPS INTO THE LEFT EAR THREE TIMES DAILY FOR SEVEN DAYS.   nitroGLYCERIN 0.4 MG SL tablet Commonly known as: NITROSTAT Place 1 tablet (0.4 mg total) under the tongue every 5 (five) minutes as needed for chest pain.   nortriptyline 10 MG capsule Commonly known as: PAMELOR TAKE TWO CAPSULES (20 MG TOTAL) BY MOUTH AT BEDTIME.   pantoprazole 40 MG tablet Commonly known as: PROTONIX Take 1 tablet (40 mg  total) by mouth daily.   ProAir HFA 108 (90 Base) MCG/ACT inhaler Generic drug: albuterol INHALE TWO PUFFS BY MOUTH EVERY FOUR TO SIX HOURS AS NEEDED FOR COUGH OR WHEEZE   promethazine 25 MG tablet Commonly known as: PHENERGAN Take 1 tablet (25 mg total) by mouth every 6 (six) hours as needed for nausea or vomiting.   promethazine-dextromethorphan 6.25-15 MG/5ML syrup Commonly known as: PROMETHAZINE-DM Take 5 mLs by mouth 4 (four) times daily as needed for cough.   Pulmicort Flexhaler 180 MCG/ACT  inhaler Generic drug: budesonide 2 inhalations 2 times per day. Rinse, gargle, and spit after use.   sodium chloride 0.65 % Soln nasal spray Commonly known as: OCEAN PLACE 1 SPRAY INTO BOTH NOSTRILS AS NEEDED FOR CONGESTION.   SUMAtriptan 25 MG tablet Commonly known as: IMITREX TAKE ONE TABLET BY MOUTH EVERY TWO HOURS AS NEEDED FOR MIGRAINE. MAY REPEAT IN TWO HOURS IF HEADACHE PERSISTS OR RECURS   traZODone 50 MG tablet Commonly known as: DESYREL TAKE 1 TABLET (50 MG TOTAL) BY MOUTH AT BEDTIME AS NEEDED FOR SLEEP.   triamcinolone 55 MCG/ACT Aero nasal inhaler Commonly known as: NASACORT 1 spray each nostril 2 times per day   Vitamin D (Cholecalciferol) 25 MCG (1000 UT) Caps Take 1,000 mg by mouth daily.   Wrist/Thumb Splint/Right Med Misc Wear on R thumb/wrist.       Past Medical History:  Diagnosis Date  . Allergic rhinitis 05/09/2006  . Allergy   . Anxiety   . Arthritis   . Asthma   . CHF (congestive heart failure) ([place])   . Chronic back pain   . Diabetes mellitus without complication ([place])   . GERD (gastroesophageal reflux disease)   . Heart murmur    as a child  . 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 s/p 12 week course of simeprevir and sofosbuvir between October 2014 and January 2015 with resolution.  Marland Kitchen History of shingles   . HIV infection ([place])    1994  . Hyperlipidemia    no meds taken now  . Hypertension   . Migraine   . Pituitary microadenoma ([place]) 08/08/2014  . Prediabetes   . Refusal of blood transfusions as patient is Jehovah's Witness   . Secondary adrenal insufficiency ([place]) 06/29/2014  . Urticaria     Past Surgical History:  Procedure Laterality Date  . BUNIONECTOMY     b/l  . COLECTOMY     2003 for diverticulitis, had colostomy bag and then reversed  . COLONOSCOPY    . HAND SURGERY    . NASAL SINUS SURGERY    . SHOULDER SURGERY     left  . TONSILLECTOMY      Review of systems negative except as  noted in HPI / PMHx or noted below:  Review of Systems  Constitutional: Negative.   HENT: Negative.   Eyes: Negative.   Respiratory: Negative.   Cardiovascular: Negative.   Gastrointestinal: Negative.   Genitourinary: Negative.   Musculoskeletal: Negative.   Skin: Negative.   Neurological: Negative.   Endo/Heme/Allergies: Negative.   Psychiatric/Behavioral: Negative.      Objective:   There were no vitals filed for this visit.        Physical Exam Constitutional:      Appearance: She is not diaphoretic.  HENT:     Head: Normocephalic.     Right Ear: Ear canal and external ear normal. Tympanic membrane is perforated.  Left Ear: Ear canal and external ear normal. Tympanic membrane is perforated.     Nose: Nose normal. No mucosal edema or rhinorrhea.     Mouth/Throat:     Pharynx: Uvula midline. No oropharyngeal exudate.  Eyes:     Conjunctiva/sclera: Conjunctivae normal.  Neck:     Thyroid: No thyromegaly.     Trachea: Trachea normal. No tracheal tenderness or tracheal deviation.  Cardiovascular:     Rate and Rhythm: Normal rate and regular rhythm.     Heart sounds: Normal heart sounds, S1 normal and S2 normal. No murmur.  Pulmonary:     Effort: No respiratory distress.     Breath sounds: Normal breath sounds. No stridor. No wheezing or rales.  Lymphadenopathy:     Head:     Right side of head: No tonsillar adenopathy.     Left side of head: No tonsillar adenopathy.     Cervical: No cervical adenopathy.  Skin:    Findings: No erythema or rash.     Nails: There is no clubbing.  Neurological:     Mental Status: She is alert.     Diagnostics:    Spirometry was performed and demonstrated an FEV1 of 1.61 at 85 % of predicted.  The patient had an Asthma Control Test with the following results: ACT Total Score: 20.    Assessment and Plan:   1. Asthma, moderate persistent, well-controlled   2. Other allergic rhinitis   3. LPRD (laryngopharyngeal reflux  disease)   4. Pruritic disorder     1.  Continue to Treat and prevent inflammation:   A.  Montelukast 10 mg - 1 tablet 1 time per day  B.  Pulmicort 180 -2 inhalations 1-2 times per day   C.  Nasacort -1 spray each nostril 1-2 times per day  2.  Continue to Treat and prevent reflux:   A.  Protonix 40 mg in a.m.  B.  Famotidine 40 mg in p.m.    3.  Can use cetirizine 10 mg -1-2 tablets 1-2 times a day (maximum 40mg )  4.  If needed:   A.  OTC Benadryl  B.  OTC nasal saline  C.  Pro Air HFA 2 puffs every 4-6 hours  D.  Nasal saline  5. Hydration of skin with bath / shower followed by Vaseline while wet.  6. Return to clinic in 12 weeks or earlier if problem  For the next 12 weeks Maeva will continue to utilize a combination of medical therapy directed against respiratory tract inflammation and if she continues to do well then she can consolidate her Pulmicort and Nasacort to just 1 time per day.  She will also continue to treat her reflux.  I encouraged her to increase her cetirizine up to a maximum of 40 mg to help with her pruritic disorder.  She will keep in contact with me noting her response to this approach.  Allena Katz, MD Allergy / Immunology [place]

## 2019-10-01 NOTE — Patient Instructions (Signed)
  1.  Continue to Treat and prevent inflammation:   A.  Montelukast 10 mg - 1 tablet 1 time per day  B.  Pulmicort 180 -2 inhalations 1-2 times per day   C.  Nasacort -1 spray each nostril 1-2 times per day  2.  Continue to Treat and prevent reflux:   A.  Protonix 40 mg in a.m.  B.  Famotidine 40 mg in p.m.    3.  Can use cetirizine 10 mg -1-2 tablets 1-2 times a day (maximum 40mg )  4.  If needed:   A.  OTC Benadryl  B.  OTC nasal saline  C.  Pro Air HFA 2 puffs every 4-6 hours  D.  Nasal saline  5. Hydration of skin with bath / shower followed by Vaseline while wet.  6. Return to clinic in 12 weeks or earlier if problem

## 2019-10-02 ENCOUNTER — Encounter: Payer: Self-pay | Admitting: Allergy and Immunology

## 2019-10-10 ENCOUNTER — Other Ambulatory Visit: Payer: Self-pay | Admitting: *Deleted

## 2019-10-15 MED ORDER — PANTOPRAZOLE SODIUM 40 MG PO TBEC: 40.0000 mg | DELAYED_RELEASE_TABLET | Freq: Every day | ORAL | 0 refills | Status: DC

## 2019-10-15 MED ORDER — METOPROLOL TARTRATE 25 MG PO TABS: 25.0000 mg | ORAL_TABLET | Freq: Two times a day (BID) | ORAL | 0 refills | Status: DC

## 2019-10-18 DIAGNOSIS — D352 Benign neoplasm of pituitary gland: Secondary | ICD-10-CM | POA: Diagnosis not present

## 2019-10-18 DIAGNOSIS — E2749 Other adrenocortical insufficiency: Secondary | ICD-10-CM | POA: Diagnosis not present

## 2019-10-18 DIAGNOSIS — E119 Type 2 diabetes mellitus without complications: Secondary | ICD-10-CM | POA: Diagnosis not present

## 2019-10-29 ENCOUNTER — Other Ambulatory Visit: Payer: Self-pay

## 2019-10-29 ENCOUNTER — Encounter: Payer: Medicaid Other | Admitting: Internal Medicine

## 2019-10-29 ENCOUNTER — Encounter: Payer: Medicaid Other | Attending: Physical Medicine & Rehabilitation | Admitting: Physical Medicine & Rehabilitation

## 2019-10-29 ENCOUNTER — Encounter: Payer: Self-pay | Admitting: Physical Medicine & Rehabilitation

## 2019-10-29 VITALS — BP 139/88 | HR 80 | Temp 98.4°F | Ht 61.0 in | Wt 173.0 lb

## 2019-10-29 DIAGNOSIS — M533 Sacrococcygeal disorders, not elsewhere classified: Secondary | ICD-10-CM | POA: Diagnosis present

## 2019-10-29 DIAGNOSIS — M461 Sacroiliitis, not elsewhere classified: Secondary | ICD-10-CM | POA: Diagnosis not present

## 2019-10-29 NOTE — Progress Notes (Signed)
Bilateral  sacroiliac injection under fluoroscopic guidance  Indication: Bilateral Low back and buttocks pain not relieved by medication management and other conservative care.  History of HIV well managed, [id number] on 03/25/2019  Informed consent was obtained after describing risks and benefits of the procedure with the patient, this includes bleeding, bruising, infection, paralysis and medication side effects. The patient wishes to proceed and has given written consent. The patient was placed in a prone position. The lumbar and sacral area was marked and prepped with Betadine. A 25-gauge 1-1/2 inch needle was inserted into the skin and subcutaneous tissue and 1 mL of 1% lidocaine was injected. Then a 25-gauge 3 inch spinal needle was inserted under fluoroscopic guidance into the left sacroiliac joint. AP and lateral images were utilized. Isovue 200x0.5 mL under live fluoroscopy demonstrated no intravascular uptake. Then a solution containing one ML of 6 mg per mLbetamethasone and 2 ML of 2% lidocaine MPF was injected x1.5 mL.Same procedure performed on the right with same technique and equipment.  Patient tolerated the procedure well. Post procedure instructions were given. Please see post procedure form.

## 2019-10-29 NOTE — Patient Instructions (Signed)
Sacroiliac injection was performed today. A combination of a naming medicine plus a cortisone medicine was injected. The injection was done under x-ray guidance. This procedure has been performed to help reduce low back and buttocks pain as well as potentially hip pain. The duration of this injection is variable lasting from hours to  Months. It may repeated if needed. 

## 2019-10-29 NOTE — Progress Notes (Signed)
  PROCEDURE RECORD [facility]   Name: MAYMIE BRUNKE DOB:June 08, 1960 MRN: [id number]  Date:10/29/2019  Physician: Alysia Penna, MD    Nurse/CMA: Truman Hayward, CMA  Allergies:  Allergies  Allergen Reactions  . Acetaminophen Other (See Comments)    Inflamed liver, hospitalized REACTION: Had liver problems, was hospitalized Inflamed liver, hospitalized  . Morphine Hives and Rash    REACTION: rash  . Triamterene Hives  . Dyazide [Hydrochlorothiazide W-Triamterene] Hives  . Lisinopril     Cough   . Topamax [Topiramate]   . Losartan Rash    Pt had rash, worsening dizziness, and nausea after starting losartan, which improved after stopping losartan    Consent Signed: Yes.    Is patient diabetic? No.  CBG today? 134  Pregnant: No. LMP: No LMP recorded. Patient is postmenopausal. (age 6-55)  Anticoagulants: no Anti-inflammatory: no Antibiotics: no  Procedure: Bilateral Sacroiliac Steroid Injection  Position: Prone Start Time:11:10   End Time: 11:25  Fluoro Time: 28  RN/CMA Cordel Drewes, CMA Parker MA    Time 10:20am 11:29am    BP 139/88 146/84    Pulse 80 87    Respirations 14 14    O2 Sat 96 96    S/S 6 6    Pain Level 10/10 7     D/C home with Kendal Hymen, [name] & O X 3, D/C instructions reviewed, and sits independently.

## 2019-10-30 ENCOUNTER — Other Ambulatory Visit: Payer: Self-pay | Admitting: Neurology

## 2019-10-30 NOTE — Telephone Encounter (Signed)
Pt called for med refill

## 2019-11-05 ENCOUNTER — Ambulatory Visit: Payer: Medicaid Other | Admitting: Internal Medicine

## 2019-11-05 ENCOUNTER — Other Ambulatory Visit: Payer: Self-pay

## 2019-11-05 ENCOUNTER — Encounter: Payer: Self-pay | Admitting: Internal Medicine

## 2019-11-05 VITALS — BP 124/70 | HR 84 | Temp 98.8°F | Ht 61.0 in | Wt 174.0 lb

## 2019-11-05 DIAGNOSIS — F410 Panic disorder [episodic paroxysmal anxiety] without agoraphobia: Secondary | ICD-10-CM | POA: Diagnosis not present

## 2019-11-05 DIAGNOSIS — I10 Essential (primary) hypertension: Secondary | ICD-10-CM | POA: Diagnosis not present

## 2019-11-05 DIAGNOSIS — F419 Anxiety disorder, unspecified: Secondary | ICD-10-CM

## 2019-11-05 MED ORDER — BUPROPION HCL ER (XL) 150 MG PO TB24: 150.0000 mg | ORAL_TABLET | Freq: Every day | ORAL | 1 refills | Status: DC

## 2019-11-05 NOTE — Patient Instructions (Addendum)
Thank you for allowing [place] to provide your care today. Today we discussed your anxiety    I have ordered no labs for you. I will call if any are abnormal.    Today we made the following changes to your medications.    Please stop your trazodone Please start wellbutrin XL 150mg  daily. Increase to 300mg  after 7 days if your symptoms are not well controlled  Please follow-up in 2 weeks.    Should you have any questions or concerns please call the internal medicine clinic at [phone].     Managing Anxiety, Adult After being diagnosed with an anxiety disorder, you may be relieved to know why you have felt or behaved a certain way. You may also feel overwhelmed about the treatment ahead and what it will mean for your life. With care and support, you can manage this condition and recover from it. How to manage lifestyle changes Managing stress and anxiety  Stress is your body's reaction to life changes and events, both good and bad. Most stress will last just a few hours, but stress can be ongoing and can lead to more than just stress. Although stress can play a major role in anxiety, it is not the same as anxiety. Stress is usually caused by something external, such as a deadline, test, or competition. Stress normally passes after the triggering event has ended.  Anxiety is caused by something internal, such as imagining a terrible outcome or worrying that something will go wrong that will devastate you. Anxiety often does not go away even after the triggering event is over, and it can become long-term (chronic) worry. It is important to understand the differences between stress and anxiety and to manage your stress effectively so that it does not lead to an anxious response. Talk with your health care provider or a counselor to learn more about reducing anxiety and stress. He or she may suggest tension reduction techniques, such as:  Music therapy. This can include creating or listening to music  that you enjoy and that inspires you.  Mindfulness-based meditation. This involves being aware of your normal breaths while not trying to control your breathing. It can be done while sitting or walking.  Centering prayer. This involves focusing on a word, phrase, or sacred image that means something to you and brings you peace.  Deep breathing. To do this, expand your stomach and inhale slowly through your nose. Hold your breath for 3-5 seconds. Then exhale slowly, letting your stomach muscles relax.  Self-talk. This involves identifying thought patterns that lead to anxiety reactions and changing those patterns.  Muscle relaxation. This involves tensing muscles and then relaxing them. Choose a tension reduction technique that suits your lifestyle and personality. These techniques take time and practice. Set aside 5-15 minutes a day to do them. Therapists can offer counseling and training in these techniques. The training to help with anxiety may be covered by some insurance plans. Other things you can do to manage stress and anxiety include:  Keeping a stress/anxiety diary. This can help you learn what triggers your reaction and then learn ways to manage your response.  Thinking about how you react to certain situations. You may not be able to control everything, but you can control your response.  Making time for activities that help you relax and not feeling guilty about spending your time in this way.  Visual imagery and yoga can help you stay calm and relax.  Medicines Medicines can help ease  symptoms. Medicines for anxiety include:  Anti-anxiety drugs.  Antidepressants. Medicines are often used as a primary treatment for anxiety disorder. Medicines will be prescribed by a health care provider. When used together, medicines, psychotherapy, and tension reduction techniques may be the most effective treatment. Relationships Relationships can play a big part in helping you recover. Try  to spend more time connecting with trusted friends and family members. Consider going to couples counseling, taking family education classes, or going to family therapy. Therapy can help you and others better understand your condition. How to recognize changes in your anxiety Everyone responds differently to treatment for anxiety. Recovery from anxiety happens when symptoms decrease and stop interfering with your daily activities at home or work. This may mean that you will start to:  Have better concentration and focus. Worry will interfere less in your daily thinking.  Sleep better.  Be less irritable.  Have more energy.  Have improved memory. It is important to recognize when your condition is getting worse. Contact your health care provider if your symptoms interfere with home or work and you feel like your condition is not improving. Follow these instructions at home: Activity  Exercise. Most adults should do the following: ? Exercise for at least 150 minutes each week. The exercise should increase your heart rate and make you sweat (moderate-intensity exercise). ? Strengthening exercises at least twice a week.  Get the right amount and quality of sleep. Most adults need 7-9 hours of sleep each night. Lifestyle   Eat a healthy diet that includes plenty of vegetables, fruits, whole grains, low-fat dairy products, and lean protein. Do not eat a lot of foods that are high in solid fats, added sugars, or salt.  Make choices that simplify your life.  Do not use any products that contain nicotine or tobacco, such as cigarettes, e-cigarettes, and chewing tobacco. If you need help quitting, ask your health care provider.  Avoid caffeine, alcohol, and certain over-the-counter cold medicines. These may make you feel worse. Ask your pharmacist which medicines to avoid. General instructions  Take over-the-counter and prescription medicines only as told by your health care provider.  Keep  all follow-up visits as told by your health care provider. This is important. Where to find support You can get help and support from these sources:  Self-help groups.  Online and [employer].  A trusted spiritual leader.  Couples counseling.  Family education classes.  Family therapy. Where to find more information You may find that joining a support group helps you deal with your anxiety. The following sources can help you locate counselors or support groups near you:  [place]: www.mentalhealthamerica.net  Anxiety and Depression Association of [place] (ADAA): https://www.clark.net/  National Alliance on Mental Illness (NAMI): www.nami.org Contact a health care provider if you:  Have a hard time staying focused or finishing daily tasks.  Spend many hours a day feeling worried about everyday life.  Become exhausted by worry.  Start to have headaches, feel tense, or have nausea.  Urinate more than normal.  Have diarrhea. Get help right away if you have:  A racing heart and shortness of breath.  Thoughts of hurting yourself or others. If you ever feel like you may hurt yourself or others, or have thoughts about taking your own life, get help right away. You can go to your nearest emergency department or call:  Your local emergency services (911 in the [place]  A suicide crisis helpline, such as the [place] Suicide  Prevention Lifeline at 203 528 6105. This is open 24 hours a day. Summary  Taking steps to learn and use tension reduction techniques can help calm you and help prevent triggering an anxiety reaction.  When used together, medicines, psychotherapy, and tension reduction techniques may be the most effective treatment.  Family, friends, and partners can play a big part in helping you recover from an anxiety disorder. This information is not intended to replace advice given to you by your health care provider. Make sure you discuss any  questions you have with your health care provider. Document Revised: 09/11/2018 Document Reviewed: 09/11/2018 Elsevier Patient Education  [place].

## 2019-11-06 ENCOUNTER — Encounter: Payer: Self-pay | Admitting: Internal Medicine

## 2019-11-06 NOTE — Progress Notes (Signed)
CC: Anxiety  HPI: [name] [name] is a 59 y.o. with PMH listed below presenting with complaint of anxiety. Please see problem based assessment and plan for further details.  Past Medical History:  Diagnosis Date  . Allergic rhinitis 05/09/2006  . Allergy   . Anxiety   . Arthritis   . Asthma   . CHF (congestive heart failure) ([place])   . Chronic back pain   . Diabetes mellitus without complication ([place])   . GERD (gastroesophageal reflux disease)   . Heart murmur    as a child  . 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 s/p 12 week course of simeprevir and sofosbuvir between October 2014 and January 2015 with resolution.  Marland Kitchen History of shingles   . HIV infection ([place])    1994  . Hyperlipidemia    no meds taken now  . Hypertension   . Migraine   . Pituitary microadenoma ([place]) 08/08/2014  . Prediabetes   . Refusal of blood transfusions as patient is Jehovah's Witness   . Secondary adrenal insufficiency ([place]) 06/29/2014  . Urticaria    Review of Systems: Review of Systems  Constitutional: Negative for chills, fever and malaise/fatigue.  Eyes: Negative for blurred vision.  Respiratory: Negative for shortness of breath.   Cardiovascular: Negative for chest pain.  Gastrointestinal: Negative for diarrhea, nausea and vomiting.  Psychiatric/Behavioral: The patient is nervous/anxious.   All other systems reviewed and are negative.    Physical Exam: Vitals:   11/05/19 0956 11/05/19 1025  BP: (!) 152/91 124/70  Pulse: 84   Temp: 98.8 F (37.1 C)   TempSrc: Oral   SpO2: 98%   Weight: 174 lb (78.9 kg)   Height: 5\' 1"  (1.549 m)    Gen: Well-developed, well nourished, NAD Card: Regular rate and rhythm, S1, S2 wnl Pulm: Breathing comfortably on room air, no cough, no distress  Extm: ROM intact, No peripheral edema Skin: Dry, Warm, no rashes, lesions, wounds.  Neuro: AAOx3, Answers questions appropriately Psych: Normal mood and affect    Assessment & Plan:   Generalized anxiety disorder with panic attacks [name] [name] is a 59 yo F w/ PMH of HIV, GERD, HTN, HLD and secondary adrenal insufficiency presenting to [facility] w/ complaint of anxiety. She mentions that she has been having lot of anxiety regarding her care of her brother as he is in poor health. Otherwise she also states she feels anxiety in general over her daily life. She mentions having difficulty with sleeping, concentrating and mentions 'going off' easily in times of stress, which is accompanied with palpitations, diaphoresis and severe anxiety. She states her irritability is affecting her daily life and has caused her to be unable to function. She states she was prescribed trazodone at her last visit which has been not been helping and she has stopped taking the medication. She also mentions continuing to take her prednisone at 5mg  TID and is not willing to decrease the dose.  A/P Presents with anxiety. Generalized anxiety disorder vs prednisone induced. Per chart review, her endocrinologist advised titrating down dose but patient was unable to tolerate the wean. GAD-7 score of 17, PHQ-9 score of 11. Had prolonged conversation regarding starting SSRI for anxiety. Discussed multiple SSRis including fluoxetine, paroxetine, sertraline and citalopram. She express fear of side effects and 'stories' about negative reaction to SSRIs from her family and friends and refused. She does mention prior improvement in her mood with buproprion. Considering hx of  tobacco use as well, reasonable to start Bupropion and add on SSRI in future visit. Agreeable to seeing integrated behavioral health specialist as well.  - Referral to IBH - Start bupropion 150mg  daily - Advised to start SSRI if bupropion alone does not improve mood  HTN (hypertension) BP Readings from Last 3 Encounters:  11/05/19 124/70  10/29/19 139/88  09/03/19 124/82   Noted to be hypertensive on arrival at 152/91. Upon repeat  check after being given time to rest, Bp recorded normal at 124/70. Denies any shortness of breath, light-headedness/dizziness or lower extremity edema  - C/w metoprolol 25mg  BID, amlodipine 10mg  daily     Patient discussed with Dr. Jimmye Norman  -Gilberto Better, [place] [facility] Pager: [phone]

## 2019-11-06 NOTE — Assessment & Plan Note (Signed)
[name] [name] is a 59 yo F w/ PMH of HIV, GERD, HTN, HLD and secondary adrenal insufficiency presenting to [facility] w/ complaint of anxiety. She mentions that she has been having lot of anxiety regarding her care of her brother as he is in poor health. Otherwise she also states she feels anxiety in general over her daily life. She mentions having difficulty with sleeping, concentrating and mentions 'going off' easily in times of stress, which is accompanied with palpitations, diaphoresis and severe anxiety. She states her irritability is affecting her daily life and has caused her to be unable to function. She states she was prescribed trazodone at her last visit which has been not been helping and she has stopped taking the medication. She also mentions continuing to take her prednisone at 5mg  TID and is not willing to decrease the dose.  A/P Presents with anxiety. Generalized anxiety disorder vs prednisone induced. Per chart review, her endocrinologist advised titrating down dose but patient was unable to tolerate the wean. GAD-7 score of 17, PHQ-9 score of 11. Had prolonged conversation regarding starting SSRI for anxiety. Discussed multiple SSRis including fluoxetine, paroxetine, sertraline and citalopram. She express fear of side effects and 'stories' about negative reaction to SSRIs from her family and friends and refused. She does mention prior improvement in her mood with buproprion. Considering hx of tobacco use as well, reasonable to start Bupropion and add on SSRI in future visit. Agreeable to seeing integrated behavioral health specialist as well.  - Referral to IBH - Start bupropion 150mg  daily - Advised to start SSRI if bupropion alone does not improve mood

## 2019-11-06 NOTE — Assessment & Plan Note (Signed)
BP Readings from Last 3 Encounters:  11/05/19 124/70  10/29/19 139/88  09/03/19 124/82   Noted to be hypertensive on arrival at 152/91. Upon repeat check after being given time to rest, Bp recorded normal at 124/70. Denies any shortness of breath, light-headedness/dizziness or lower extremity edema  - C/w metoprolol 25mg  BID, amlodipine 10mg  daily

## 2019-11-08 NOTE — Progress Notes (Signed)
Internal Medicine Clinic Attending  Case discussed with Dr. Lee  At the time of the visit.  We reviewed the resident's history and exam and pertinent patient test results.  I agree with the assessment, diagnosis, and plan of care documented in the resident's note.    

## 2019-11-12 ENCOUNTER — Telehealth: Payer: Self-pay | Admitting: Licensed Clinical Social Worker

## 2019-11-12 ENCOUNTER — Encounter: Payer: Self-pay | Admitting: Licensed Clinical Social Worker

## 2019-11-12 NOTE — Telephone Encounter (Signed)
Patient was called to discuss a referral for services. Patient did not answer, and a vm was left for the patient.

## 2019-11-13 ENCOUNTER — Other Ambulatory Visit: Payer: Medicaid Other

## 2019-11-13 ENCOUNTER — Other Ambulatory Visit: Payer: Self-pay

## 2019-11-13 DIAGNOSIS — Z113 Encounter for screening for infections with a predominantly sexual mode of transmission: Secondary | ICD-10-CM

## 2019-11-13 DIAGNOSIS — B20 Human immunodeficiency virus [HIV] disease: Secondary | ICD-10-CM

## 2019-11-13 DIAGNOSIS — Z79899 Other long term (current) drug therapy: Secondary | ICD-10-CM

## 2019-11-14 ENCOUNTER — Other Ambulatory Visit: Payer: Self-pay | Admitting: Allergy and Immunology

## 2019-11-14 ENCOUNTER — Other Ambulatory Visit: Payer: Self-pay | Admitting: Internal Medicine

## 2019-11-14 ENCOUNTER — Other Ambulatory Visit: Payer: Medicaid Other

## 2019-11-14 DIAGNOSIS — F419 Anxiety disorder, unspecified: Secondary | ICD-10-CM

## 2019-11-14 LAB — T-HELPER CELL (CD4) - (RCID CLINIC ONLY)
CD4 % Helper T Cell: 30 % — ABNORMAL LOW (ref 33–65)
CD4 T Cell Abs: 768 /uL (ref 400–1790)

## 2019-11-15 LAB — CBC WITH DIFFERENTIAL/PLATELET
Absolute Monocytes: 862 cells/uL (ref 200–950)
Basophils Absolute: 26 cells/uL (ref 0–200)
Basophils Relative: 0.3 %
Eosinophils Absolute: 211 cells/uL (ref 15–500)
Eosinophils Relative: 2.4 %
HCT: 43.3 % (ref 35.0–45.0)
Hemoglobin: 14 g/dL (ref 11.7–15.5)
Lymphs Abs: 2684 cells/uL (ref 850–3900)
MCH: 27.4 pg (ref 27.0–33.0)
MCHC: 32.3 g/dL (ref 32.0–36.0)
MCV: 84.7 fL (ref 80.0–100.0)
MPV: 10.8 fL (ref 7.5–12.5)
Monocytes Relative: 9.8 %
Neutro Abs: 5016 cells/uL (ref 1500–7800)
Neutrophils Relative %: 57 %
Platelets: 253 10*3/uL (ref 140–400)
RBC: 5.11 10*6/uL — ABNORMAL HIGH (ref 3.80–5.10)
RDW: 15.8 % — ABNORMAL HIGH (ref 11.0–15.0)
Total Lymphocyte: 30.5 %
WBC: 8.8 10*3/uL (ref 3.8–10.8)

## 2019-11-15 LAB — COMPREHENSIVE METABOLIC PANEL
AG Ratio: 1.5 (calc) (ref 1.0–2.5)
ALT: 18 U/L (ref 6–29)
AST: 16 U/L (ref 10–35)
Albumin: 4.4 g/dL (ref 3.6–5.1)
Alkaline phosphatase (APISO): 74 U/L (ref 37–153)
BUN: 13 mg/dL (ref 7–25)
CO2: 26 mmol/L (ref 20–32)
Calcium: 9.7 mg/dL (ref 8.6–10.4)
Chloride: 103 mmol/L (ref 98–110)
Creat: 0.92 mg/dL (ref 0.50–1.05)
Globulin: 2.9 g/dL (calc) (ref 1.9–3.7)
Glucose, Bld: 69 mg/dL (ref 65–99)
Potassium: 4.3 mmol/L (ref 3.5–5.3)
Sodium: 140 mmol/L (ref 135–146)
Total Bilirubin: 0.4 mg/dL (ref 0.2–1.2)
Total Protein: 7.3 g/dL (ref 6.1–8.1)

## 2019-11-15 LAB — HIV-1 RNA QUANT-NO REFLEX-BLD
HIV 1 RNA Quant: 63 copies/mL — ABNORMAL HIGH
HIV-1 RNA Quant, Log: 1.8 Log copies/mL — ABNORMAL HIGH

## 2019-11-15 LAB — LIPID PANEL
Cholesterol: 159 mg/dL (ref <200)
HDL: 40 mg/dL — ABNORMAL LOW (ref >=50)
LDL Cholesterol (Calc): 81 mg/dL (calc)
Non-HDL Cholesterol (Calc): 119 mg/dL (calc) (ref <130)
Total CHOL/HDL Ratio: 4 (calc) (ref <5.0)
Triglycerides: 286 mg/dL — ABNORMAL HIGH (ref <150)

## 2019-11-15 LAB — RPR: RPR Ser Ql: NONREACTIVE

## 2019-11-19 ENCOUNTER — Encounter: Payer: Medicaid Other | Admitting: Internal Medicine

## 2019-11-20 LAB — HEMOGLOBIN A1C: Hemoglobin A1C: 6.5

## 2019-11-22 ENCOUNTER — Ambulatory Visit (INDEPENDENT_AMBULATORY_CARE_PROVIDER_SITE_OTHER): Payer: Medicaid Other | Admitting: Student

## 2019-11-22 ENCOUNTER — Encounter: Payer: Medicaid Other | Admitting: Internal Medicine

## 2019-11-22 ENCOUNTER — Encounter: Payer: Self-pay | Admitting: Student

## 2019-11-22 ENCOUNTER — Encounter: Payer: Self-pay | Admitting: Dietician

## 2019-11-22 VITALS — BP 139/82 | HR 81 | Temp 98.6°F | Wt 175.2 lb

## 2019-11-22 DIAGNOSIS — F419 Anxiety disorder, unspecified: Secondary | ICD-10-CM

## 2019-11-22 DIAGNOSIS — F410 Panic disorder [episodic paroxysmal anxiety] without agoraphobia: Secondary | ICD-10-CM

## 2019-11-22 DIAGNOSIS — T380X5D Adverse effect of glucocorticoids and synthetic analogues, subsequent encounter: Secondary | ICD-10-CM | POA: Diagnosis not present

## 2019-11-22 DIAGNOSIS — F411 Generalized anxiety disorder: Secondary | ICD-10-CM | POA: Diagnosis not present

## 2019-11-22 DIAGNOSIS — I10 Essential (primary) hypertension: Secondary | ICD-10-CM

## 2019-11-22 DIAGNOSIS — E099 Drug or chemical induced diabetes mellitus without complications: Secondary | ICD-10-CM | POA: Diagnosis not present

## 2019-11-22 DIAGNOSIS — F32A Depression, unspecified: Secondary | ICD-10-CM

## 2019-11-22 DIAGNOSIS — Z21 Asymptomatic human immunodeficiency virus [HIV] infection status: Secondary | ICD-10-CM

## 2019-11-22 DIAGNOSIS — F329 Major depressive disorder, single episode, unspecified: Secondary | ICD-10-CM | POA: Diagnosis not present

## 2019-11-22 LAB — POCT GLYCOSYLATED HEMOGLOBIN (HGB A1C): Hemoglobin A1C: 6.3 % — AB (ref 4.0–5.6)

## 2019-11-22 LAB — GLUCOSE, CAPILLARY: Glucose-Capillary: 199 mg/dL — ABNORMAL HIGH (ref 70–99)

## 2019-11-22 MED ORDER — BUPROPION HCL ER (XL) 150 MG PO TB24: 150.0000 mg | ORAL_TABLET | Freq: Every day | ORAL | 3 refills | Status: DC

## 2019-11-22 NOTE — Assessment & Plan Note (Addendum)
A: Patient continues to take prednisone, managed by endocrinology. HbA1c today is 6.3 on Victoza alone. No history of microalbuminuria. Patient checks blood sugars at home frequently which have fallen within target range 88% of the time.  P: Continue to follow endocrinology's guidance.  - Obtained urine microalbumin/Cr ratio

## 2019-11-22 NOTE — Assessment & Plan Note (Addendum)
A: Patient endorses trouble sleeping, little interest or pleasure in doing things, having little energy, appetite changes, and some trouble concentrating over the past several weeks. PHQ9 today remains 11, unchanged from previous visit. However, symptoms are not affecting her functioning. She states her main stressor is the illness of her brother; she is his primary caretaker.   P: Continue nortriptyline 20mg  at night and Bupropion 150mg  in the morning. - Instructed patient she may increase to 150mg  BID if she finds this improves symptoms.  - continue to monitor for improvement.  - Patient has integrated behavioral health's phone number from previous visit and will call to schedule appointment if she desires.

## 2019-11-22 NOTE — Progress Notes (Signed)
° °  CC: Anxiety  HPI:  [name] [name] is a 59 y.o. female with history of HIV, secondary adrenal insufficiency, HTN, HLD, and GERD presenting for follow-up of her anxiety. She states that she has been very anxious recently as she is currently the sole caretaker for her ill brother who is undergoing dialysis with history of strokes and alcohol use disorder. She was started on Bupropion 150mg  daily at her last visit July 13th and she states that her anxiety has almost completely resolved on the medication. She does endorse trouble sleeping with fatigue and less pleasure in activities, but denies any SI or excessive worrying/irritation. She denies any headaches, vision changes, weakness, or any other symptoms.  Past Medical History:  Diagnosis Date   Allergic rhinitis 05/09/2006   Allergy    Anxiety    Arthritis    Asthma    CHF (congestive heart failure) (H[facility])    Chronic back pain    Diabetes mellitus without complication ([facility])    GERD (gastroesophageal reflux disease)    Heart murmur    as a child   Hepatitis C    genotype 1b, 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 s/p 12 week course of simeprevir and sofosbuvir between October 2014 and January 2015 with resolution.   History of shingles    HIV infection ([place])    1994   Hyperlipidemia    no meds taken now   Hypertension    Migraine    Pituitary microadenoma ([place]) 08/08/2014   Prediabetes    Refusal of blood transfusions as patient is Jehovah's Witness    Secondary adrenal insufficiency ([place]) 06/29/2014   Urticaria    PFHx: Patient's brother has a history of ESRD and multiple CVA.   PSHx: Patient previously smoked cigarettes and does not use alcohol or other drugs.   Review of Systems:  All others negative except as noted in HPI above.    Vitals:   11/22/19 0944 11/22/19 1016  BP: (!) 140/82 (!) 139/82  Pulse: 86 81  Temp: 98.6 F (37 C)   TempSrc: Oral   SpO2: 100%   Weight: 175 lb 3.2 oz  (79.5 kg)    Physical Exam: Constitutional: Patient is obese. She appears well. No acute distress. Eyes: No conjunctival injection. Sclera non-icteric.  HENT: Moist mucus membranes.  Respiratory: Lungs are clear to auscultation, bilaterally. No wheezes, rales, or rhonchi. Cardiovascular: Regular rate and rhythm. No murmurs, rubs, or gallops. No lower extremity edema.  Skin: No lesions notes. No jaundice. Psychiatric: Patient is pleasant and cooperative. Normal range of affect. No SI.    Assessment & Plan:   See Encounters Tab for problem based charting.  Patient seen with Dr. Evette Doffing.  [name] [name], PGY1 [facility]  Pager: [phone]

## 2019-11-22 NOTE — Patient Instructions (Addendum)
I am so happy to hear that you are feeling better. I will refill your Bupropion 150mg  in the morning with refills. You may increase to 2 pills in the morning if you feel that it would help.   Today, your blood pressure was slightly elevated. Please keep a log of your blood pressures at home 2-3 times per week when you are relaxed.  Your diabetes is under great control.  Please make an appointment for follow-up in 1 month for a blood pressure check and feel free to call [facility] for a counseling appointment at any time.  Thank you,  Dr. Jeralyn Bennett   Hypertension, Adult Hypertension is another name for high blood pressure. High blood pressure forces your heart to work harder to pump blood. This can cause problems over time. There are two numbers in a blood pressure reading. There is a top number (systolic) over a bottom number (diastolic). It is best to have a blood pressure that is below 120/80. Healthy choices can help lower your blood pressure, or you may need medicine to help lower it. What are the causes? The cause of this condition is not known. Some conditions may be related to high blood pressure. What increases the risk?  Smoking.  Having type 2 diabetes mellitus, high cholesterol, or both.  Not getting enough exercise or physical activity.  Being overweight.  Having too much fat, sugar, calories, or salt (sodium) in your diet.  Drinking too much alcohol.  Having long-term (chronic) kidney disease.  Having a family history of high blood pressure.  Age. Risk increases with age.  Race. You may be at higher risk if you are African American.  Gender. Men are at higher risk than women before age 40. After age 78, women are at higher risk than men.  Having obstructive sleep apnea.  Stress. What are the signs or symptoms?  High blood pressure may not cause symptoms. Very high blood pressure (hypertensive crisis) may cause: ? Headache. ? Feelings of worry or  nervousness (anxiety). ? Shortness of breath. ? Nosebleed. ? A feeling of being sick to your stomach (nausea). ? Throwing up (vomiting). ? Changes in how you see. ? Very bad chest pain. ? Seizures. How is this treated?  This condition is treated by making healthy lifestyle changes, such as: ? Eating healthy foods. ? Exercising more. ? Drinking less alcohol.  Your health care provider may prescribe medicine if lifestyle changes are not enough to get your blood pressure under control, and if: ? Your top number is above 130. ? Your bottom number is above 80.  Your personal target blood pressure may vary. Follow these instructions at home: Eating and drinking   If told, follow the DASH eating plan. To follow this plan: ? Fill one half of your plate at each meal with fruits and vegetables. ? Fill one fourth of your plate at each meal with whole grains. Whole grains include whole-wheat pasta, brown rice, and whole-grain bread. ? Eat or drink low-fat dairy products, such as skim milk or low-fat yogurt. ? Fill one fourth of your plate at each meal with low-fat (lean) proteins. Low-fat proteins include fish, chicken without skin, eggs, beans, and tofu. ? Avoid fatty meat, cured and processed meat, or chicken with skin. ? Avoid pre-made or processed food.  Eat less than 1,500 mg of salt each day.  Do not drink alcohol if: ? Your doctor tells you not to drink. ? You are pregnant, may be pregnant, or  are planning to become pregnant.  If you drink alcohol: ? Limit how much you use to:  0-1 drink a day for women.  0-2 drinks a day for men. ? Be aware of how much alcohol is in your drink. In the [place] one drink equals one 12 oz bottle of beer (355 mL), one 5 oz glass of wine (148 mL), or one 1 oz glass of hard liquor (44 mL). Lifestyle   Work with your doctor to stay at a healthy weight or to lose weight. Ask your doctor what the best weight is for you.  Get at least 30 minutes of  exercise most days of the week. This may include walking, swimming, or biking.  Get at least 30 minutes of exercise that strengthens your muscles (resistance exercise) at least 3 days a week. This may include lifting weights or doing Pilates.  Do not use any products that contain nicotine or tobacco, such as cigarettes, e-cigarettes, and chewing tobacco. If you need help quitting, ask your doctor.  Check your blood pressure at home as told by your doctor.  Keep all follow-up visits as told by your doctor. This is important. Medicines  Take over-the-counter and prescription medicines only as told by your doctor. Follow directions carefully.  Do not skip doses of blood pressure medicine. The medicine does not work as well if you skip doses. Skipping doses also puts you at risk for problems.  Ask your doctor about side effects or reactions to medicines that you should watch for. Contact a doctor if you:  Think you are having a reaction to the medicine you are taking.  Have headaches that keep coming back (recurring).  Feel dizzy.  Have swelling in your ankles.  Have trouble with your vision. Get help right away if you:  Get a very bad headache.  Start to feel mixed up (confused).  Feel weak or numb.  Feel faint.  Have very bad pain in your: ? Chest. ? Belly (abdomen).  Throw up more than once.  Have trouble breathing. Summary  Hypertension is another name for high blood pressure.  High blood pressure forces your heart to work harder to pump blood.  For most people, a normal blood pressure is less than 120/80.  Making healthy choices can help lower blood pressure. If your blood pressure does not get lower with healthy choices, you may need to take medicine. This information is not intended to replace advice given to you by your health care provider. Make sure you discuss any questions you have with your health care provider. Document Revised: 12/20/2017 Document  Reviewed: 12/20/2017 Elsevier Patient Education  2020 [employer].

## 2019-11-22 NOTE — Assessment & Plan Note (Signed)
A: Blood pressure today is slightly elevated: BP Readings from Last 3 Encounters:  11/22/19 (!) 139/82  11/05/19 124/70  10/29/19 139/88  Patient states she has a wrist cuff at home but does not measure her pressures frequently. She is currently taking amlodipine 10mg  and Metoprolol 25mg  BID. She denies any headaches or vision changes.   P: Gave patient a blood pressure journal to take home so that she can document a few readings per week. - If pressures are elevated at home, will switch metoprolol 25mg  BID to carvedilol.  - She is allergic to lisinopril, losartan, and triamterene.

## 2019-11-22 NOTE — Assessment & Plan Note (Signed)
A: Last visit, patient endorsed difficulty sleeping, concentrating, and increased irritation. She states that she has been under increased stress as her brother has been ill and she is currently his primary caretaker. GAD7 score was 17. Her Trazodone was discontinued as it didn't improve her symptoms and she was started on bupropion 150mg  in the mornings. GAD7 score today is 1 and she notes her anxiety has almost resolved on bupropion.   P: Continue Bupropion 150mg  in the mornings.  - She may take up to 150mg  BID if needed.

## 2019-11-23 LAB — MICROALBUMIN / CREATININE URINE RATIO
Creatinine, Urine: 244.9 mg/dL
Microalb/Creat Ratio: 5 mg/g creat (ref 0–29)
Microalbumin, Urine: 13.1 ug/mL

## 2019-11-24 NOTE — Progress Notes (Signed)
Internal Medicine Clinic Attending  I saw and evaluated the patient.  I personally confirmed the key portions of the history and exam documented by Dr. Speakman and I reviewed pertinent patient test results.  The assessment, diagnosis, and plan were formulated together and I agree with the documentation in the resident's note.  

## 2019-11-25 ENCOUNTER — Telehealth: Payer: Self-pay | Admitting: Student

## 2019-11-25 NOTE — Telephone Encounter (Signed)
Spoke with patient regarding her lab results, informing her that her diabetes is under great control. Hgb A1c 6.3 without microalbuminuria. She has no concerns or questions at this time.  Jeralyn Bennett, PGY1 Internal Medicine Pager: [phone]

## 2019-11-27 ENCOUNTER — Ambulatory Visit (INDEPENDENT_AMBULATORY_CARE_PROVIDER_SITE_OTHER): Payer: Medicaid Other | Admitting: Infectious Diseases

## 2019-11-27 ENCOUNTER — Other Ambulatory Visit: Payer: Self-pay

## 2019-11-27 VITALS — BP 153/100 | HR 91 | Temp 98.0°F | Ht 61.0 in | Wt 175.0 lb

## 2019-11-27 DIAGNOSIS — I10 Essential (primary) hypertension: Secondary | ICD-10-CM | POA: Diagnosis not present

## 2019-11-27 DIAGNOSIS — B20 Human immunodeficiency virus [HIV] disease: Secondary | ICD-10-CM

## 2019-11-27 MED ORDER — BIKTARVY 50-200-25 MG PO TABS: 1.0000 | ORAL_TABLET | Freq: Every day | ORAL | 11 refills | Status: DC

## 2019-11-27 NOTE — Assessment & Plan Note (Signed)
At home her BP is usually 120-130 by her machine. Seems to be under good control.

## 2019-11-27 NOTE — Progress Notes (Signed)
Subjective:    [name], female    DOB: [birth year], 59 y.o.   MRN: [id number]  Patient Active Problem List   Diagnosis Date Noted  . Generalized anxiety disorder with panic attacks 06/20/2019  . 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 Chronic migraine 08/27/2018  . Disorder of left eustachian tube 05/19/2017  . Vertigo of central origin 01/23/2017  . 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 BPPV (benign paroxysmal positional vertigo) 10/02/2015  . Liver fibrosis ([place]) 12/04/2014  . Pituitary microadenoma ([place]) 08/08/2014  . 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 Vitamin D deficiency 06/29/2014  . Secondary adrenal insufficiency ([place]) 06/29/2014  . History of tympanostomy tube placement 02/07/2014  . Refusal of blood transfusions as patient is Jehovah's Witness 11/18/2013  . Hepatitis C virus infection cured after antiviral drug therapy 03/16/2012  . Health care maintenance 12/15/2011  . Opioid dependence ([place]) 10/05/2010  . HTN (hypertension) 10/05/2010  . Hyperlipidemia associated with type 2 diabetes mellitus ([place]) 10/23/2008  . HIV disease ([place]) 05/09/2006  . Depression 05/09/2006  . Allergic rhinitis 05/09/2006  . GERD 05/09/2006     CC: HIV follow-up care. Increased stress with brother's health lately.    HPI:  [name] [name] is a 59 y.o. female with well controlled HIV (VL 36, CD4 800) on Biktarvy. She does not miss any doses of her HAART. She is a type 2 diabetic, on chronic prednisone for Addison's disease a h/o pituitary adenoma.   Previous regimens for HIV treatment include Lexiva/Norvir/Truvada, Isentress/Truvada (rashes), Prezcobix/Truvada (rash). History of treated Hepatitis C (completed 04/2013 with -RNA)    She has been experiencing increased stress lately with her brother's health declining. He is now in AL and on dialysis. She had to add Wellbutrin to her reigmen to help with severe  anxiety she was experiencing. Other than that she has not had any additional medications. Continues her Biktarvy once a day and has been tolerating this very well.     Review of Systems  Constitutional: Negative for appetite change, chills, fatigue, fever and unexpected weight change.  HENT: Negative for mouth sores, sore throat and trouble swallowing.   Eyes: Negative for pain and visual disturbance.  Respiratory: Negative for cough and shortness of breath.   Cardiovascular: Negative.  Negative for chest pain.  Gastrointestinal: Negative for abdominal pain, diarrhea, nausea and vomiting.  Genitourinary: Negative for dysuria, menstrual problem and pelvic pain.  Musculoskeletal: Negative for back pain, myalgias and neck pain.  Skin: Negative for color change and rash.  Neurological: Negative for weakness, numbness and headaches.  Hematological: Negative for adenopathy.  Psychiatric/Behavioral: Negative for dysphoric mood. The patient is not nervous/anxious.        Objective:   Physical Exam Constitutional:      Appearance: Normal appearance. She is not ill-appearing.  HENT:     Mouth/Throat:     Mouth: Mucous membranes are moist.     Pharynx: Oropharynx is clear.  Eyes:     General: No scleral icterus. Pulmonary:     Effort: Pulmonary effort is normal.  Neurological:     Mental Status: She is oriented to person, place, and time.  Psychiatric:        Mood and Affect: Mood normal.        Thought Content: Thought content normal.       Assessment & Plan:   Problem List Items Addressed This Visit      Unprioritized  HIV disease ([place]) - Primary (Chronic)    Dominik continues to do very well on medication. She has had a low level viral blip with recent labs @ 62 copies that is not concerning. She will continue Biktarvy once a day and repeat blood work in 30m.  No drug interactions noted with newly added medications.  Encouraged to separate MVIs from [place] by at least 6 hours.   Will need pap in 12-2020 for routine screening / maintenance.  RTC in 66m       Relevant Medications   bictegravir-emtricitabine-tenofovir AF (BIKTARVY) 50-200-25 MG TABS tablet   Other Relevant Orders   HIV-1 RNA quant-no reflex-bld   T-helper cell (CD4)- ([facility] clinic only)   Basic metabolic panel   HTN (hypertension) (Chronic)    At home her BP is usually 120-130 by her machine. Seems to be under good control.        Other Visit Diagnoses    Human immunodeficiency virus (HIV) disease ([place])       Relevant Medications   bictegravir-emtricitabine-tenofovir AF (BIKTARVY) 50-200-25 MG TABS tablet     [name], MSN, NP-C [facility] [email or web address] Pager: [phone] Office: [phone] [place]: [phone]

## 2019-11-27 NOTE — Assessment & Plan Note (Signed)
[name] [name] continues to do very well on medication. She has had a low level viral blip with recent labs @ 62 copies that is not concerning. She will continue Biktarvy once a day and repeat blood work in 27m.  No drug interactions noted with newly added medications.  Encouraged to separate MVIs from [place] by at least 6 hours.  Will need pap in 12-2020 for routine screening / maintenance.  RTC in 68m

## 2019-11-27 NOTE — Patient Instructions (Signed)
So nice to see you today!  I am hopeful your stress will continue to improve - if you need some talk therapy we have a wonderful counselor here available to you.   Please continue your Biktarvy every day. Make sure to separate any multivitamins you may take by 6 hours to be sure your stomach absorbs the biktarvy 100%.   Will see you back in 6 months to repeat your blood work and check in again.   Would recommend your flu shot in the Fall around October.   We should also have more information coming out soon about a booster

## 2019-11-28 ENCOUNTER — Encounter: Payer: Medicaid Other | Admitting: Infectious Diseases

## 2019-12-04 ENCOUNTER — Other Ambulatory Visit: Payer: Self-pay | Admitting: Student

## 2019-12-04 ENCOUNTER — Telehealth: Payer: Self-pay | Admitting: Student

## 2019-12-04 DIAGNOSIS — F419 Anxiety disorder, unspecified: Secondary | ICD-10-CM

## 2019-12-04 MED ORDER — BUPROPION HCL ER (XL) 150 MG PO TB24: ORAL_TABLET | ORAL | 3 refills | Status: DC

## 2019-12-04 NOTE — Telephone Encounter (Signed)
Reordered her medication to be take 2 tablets in the morning. Thank you!  Vasili Kaitlin Alcindor

## 2019-12-04 NOTE — Telephone Encounter (Signed)
Pls contact pt regarding increase in medicine [phone]

## 2019-12-04 NOTE — Telephone Encounter (Signed)
Returned call to patient. States she was instructed to increase wellbutrin to BID at [facility] on 11/22/2019. Now need refill at [facility]. Hubbard Hartshorn, BSN, RN-BC

## 2019-12-09 ENCOUNTER — Other Ambulatory Visit: Payer: Self-pay | Admitting: Internal Medicine

## 2019-12-12 ENCOUNTER — Telehealth: Payer: Self-pay

## 2019-12-12 NOTE — Addendum Note (Signed)
Addended by: Hulan Fray on: 12/12/2019 07:44 AM   Modules accepted: Orders

## 2019-12-12 NOTE — Telephone Encounter (Signed)
Patient called office requesting covid booster shot. Office is currently not giving any covid shots, asked patient to contact [facility], [facility] if she meets current requirements for booster shot.  Aundria Rud, [place]

## 2019-12-14 ENCOUNTER — Other Ambulatory Visit: Payer: Self-pay | Admitting: Allergy and Immunology

## 2019-12-24 ENCOUNTER — Encounter: Payer: Self-pay | Admitting: Allergy and Immunology

## 2019-12-24 ENCOUNTER — Ambulatory Visit: Payer: Medicaid Other | Admitting: Allergy and Immunology

## 2019-12-24 ENCOUNTER — Other Ambulatory Visit: Payer: Self-pay

## 2019-12-24 VITALS — BP 122/80 | HR 83 | Resp 18

## 2019-12-24 DIAGNOSIS — L299 Pruritus, unspecified: Secondary | ICD-10-CM

## 2019-12-24 DIAGNOSIS — K219 Gastro-esophageal reflux disease without esophagitis: Secondary | ICD-10-CM | POA: Diagnosis not present

## 2019-12-24 DIAGNOSIS — J4540 Moderate persistent asthma, uncomplicated: Secondary | ICD-10-CM

## 2019-12-24 DIAGNOSIS — J3089 Other allergic rhinitis: Secondary | ICD-10-CM | POA: Diagnosis not present

## 2019-12-24 MED ORDER — GABAPENTIN 100 MG PO CAPS: 100.0000 mg | ORAL_CAPSULE | Freq: Every day | ORAL | 1 refills | Status: DC

## 2019-12-24 NOTE — Progress Notes (Signed)
[facility]   Follow-up Note  Referring Provider: Ladona Horns, MD Primary Provider: [name], MD Date of Office Visit: 12/24/2019  Subjective:   [name] [name] (DOB: 03/13/1961) is a 59 y.o. female who returns to the [facility] on 12/24/2019 in re-evaluation of the following:  HPI: [name] returns to this clinic in evaluation of asthma and allergic rhinitis and a history of LPR and pruritic disorder.  Her last visit to this clinic was 01 October 2019.  As the hot weather has arrived she has had a little bit more problems with some occasional shortness of breath and coughing.  Her requirement for short acting bronchodilator is about 3 times per week.  She continues to use Pulmicort twice a day on a regular basis and also continues on montelukast.  Her nose is doing good while using a nasal steroid twice a day and montelukast consistently.  Her reflux is under very good control at this point.  Her pruritus has really starting to bother her significantly.  She did increase her cetirizine to 40 mg twice a day and she takes Benadryl at nighttime and this does not really help this issue.  It is really at nighttime that she has the worse itching.  She has never seen a dermatitis.  Allergies as of 12/24/2019      Reactions   Acetaminophen Other (See Comments)   Inflamed liver, hospitalized REACTION: Had liver problems, was hospitalized Inflamed liver, hospitalized   Morphine Hives, Rash   REACTION: rash   Triamterene Hives   Dyazide [hydrochlorothiazide W-triamterene] Hives   Lisinopril    Cough    Topamax [topiramate]    Losartan Rash   Pt had rash, worsening dizziness, and nausea after starting losartan, which improved after stopping losartan      Medication List      amLODipine 10 MG tablet Commonly known as: NORVASC TAKE ONE TABLET BY MOUTH DAILY   atorvastatin 10 MG tablet Commonly known as: LIPITOR Take  0.5 tablets (5 mg total) by mouth daily.   Biktarvy 50-200-25 MG Tabs tablet Generic drug: bictegravir-emtricitabine-tenofovir AF Take 1 tablet by mouth daily.   buPROPion 150 MG 24 hr tablet Commonly known as: WELLBUTRIN XL Take 2 tablet's in the morning   cetirizine 10 MG tablet Commonly known as: ZYRTEC 1 tablet daily   famotidine 40 MG tablet Commonly known as: PEPCID TAKE ONE TABLET BY MOUTH AT BEDTIME   hydrocortisone 10 MG tablet Commonly known as: CORTEF Take 10 mg by mouth as directed. Pt takes 10 mg in the am and 5 mg at 11 am and 3 pm   liraglutide 18 MG/3ML Sopn Commonly known as: VICTOZA Inject into the skin.   metoprolol tartrate 25 MG tablet Commonly known as: LOPRESSOR TAKE ONE TABLET BY MOUTH TWICE A DAY   montelukast 10 MG tablet Commonly known as: SINGULAIR Take 1 tablet (10 mg total) by mouth at bedtime.   NEOMYCIN-POLYMYXIN-HYDROCORTISONE 1 % Soln OTIC solution Commonly known as: CORTISPORIN PLACE THREE DROPS INTO THE LEFT EAR THREE TIMES DAILY FOR SEVEN DAYS.   nitroGLYCERIN 0.4 MG SL tablet Commonly known as: NITROSTAT Place 1 tablet (0.4 mg total) under the tongue every 5 (five) minutes as needed for chest pain.   nortriptyline 10 MG capsule Commonly known as: PAMELOR TAKE TWO CAPSULES BY MOUTH AT BEDTIME   pantoprazole 40 MG tablet Commonly known as: PROTONIX TAKE ONE TABLET BY MOUTH DAILY  ProAir HFA 108 (90 Base) MCG/ACT inhaler Generic drug: albuterol INHALE TWO PUFFS BY MOUTH EVERY FOUR TO SIX HOURS AS NEEDED FOR COUGH OR WHEEZE   promethazine 25 MG tablet Commonly known as: PHENERGAN Take 1 tablet (25 mg total) by mouth every 6 (six) hours as needed for nausea or vomiting.   promethazine-dextromethorphan 6.25-15 MG/5ML syrup Commonly known as: PROMETHAZINE-DM Take 5 mLs by mouth 4 (four) times daily as needed for cough.   Pulmicort Flexhaler 180 MCG/ACT inhaler Generic drug: budesonide 2 inhalations 2 times per day. Rinse,  gargle, and spit after use.   sodium chloride 0.65 % Soln nasal spray Commonly known as: OCEAN PLACE 1 SPRAY INTO BOTH NOSTRILS AS NEEDED FOR CONGESTION.   triamcinolone 55 MCG/ACT Aero nasal inhaler Commonly known as: NASACORT 1 spray each nostril 2 times per day   Vitamin D (Cholecalciferol) 25 MCG (1000 UT) Caps Take 1,000 mg by mouth daily.   Wrist/Thumb Splint/Right Med Misc Wear on R thumb/wrist.       Past Medical History:  Diagnosis Date  . Allergic rhinitis 05/09/2006  . Allergy   . Anxiety   . Arthritis   . Asthma   . CHF (congestive heart failure) ([place])   . Chronic back pain   . Diabetes mellitus without complication ([place])   . GERD (gastroesophageal reflux disease)   . Heart murmur    as a child  . 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 s/p 12 week course of simeprevir and sofosbuvir between October 2014 and January 2015 with resolution.  Marland Kitchen History of shingles   . HIV infection ([place])    1994  . Hyperlipidemia    no meds taken now  . Hypertension   . Migraine   . Pituitary microadenoma ([place]) 08/08/2014  . Prediabetes   . Refusal of blood transfusions as patient is Jehovah's Witness   . Secondary adrenal insufficiency ([place]) 06/29/2014  . Urticaria     Past Surgical History:  Procedure Laterality Date  . BUNIONECTOMY     b/l  . COLECTOMY     2003 for diverticulitis, had colostomy bag and then reversed  . COLONOSCOPY    . HAND SURGERY    . NASAL SINUS SURGERY    . SHOULDER SURGERY     left  . TONSILLECTOMY      Review of systems negative except as noted in HPI / PMHx or noted below:  Review of Systems  Constitutional: Negative.   HENT: Negative.   Eyes: Negative.   Respiratory: Negative.   Cardiovascular: Negative.   Gastrointestinal: Negative.   Genitourinary: Negative.   Musculoskeletal: Negative.   Skin: Negative.   Neurological: Negative.   Endo/Heme/Allergies: Negative.   Psychiatric/Behavioral:  Negative.      Objective:   Vitals:   12/24/19 1355  BP: 122/80  Pulse: 83  Resp: 18  SpO2: 97%          Physical Exam Constitutional:      Appearance: She is not diaphoretic.  HENT:     Head: Normocephalic.     Right Ear: Ear canal and external ear normal. Tympanic membrane is perforated.     Left Ear: Ear canal and external ear normal. Tympanic membrane is perforated.     Nose: Nose normal. No mucosal edema or rhinorrhea.     Mouth/Throat:     Pharynx: Uvula midline. No oropharyngeal exudate.  Eyes:     Conjunctiva/sclera: Conjunctivae normal.  Neck:  Thyroid: No thyromegaly.     Trachea: Trachea normal. No tracheal tenderness or tracheal deviation.  Cardiovascular:     Rate and Rhythm: Normal rate and regular rhythm.     Heart sounds: Normal heart sounds, S1 normal and S2 normal. No murmur heard.   Pulmonary:     Effort: No respiratory distress.     Breath sounds: Normal breath sounds. No stridor. No wheezing or rales.  Lymphadenopathy:     Head:     Right side of head: No tonsillar adenopathy.     Left side of head: No tonsillar adenopathy.     Cervical: No cervical adenopathy.  Skin:    Findings: No erythema or rash.     Nails: There is no clubbing.  Neurological:     Mental Status: She is alert.     Diagnostics:    Spirometry was performed and demonstrated an FEV1 of 1.41 at 75 % of predicted.   Assessment and Plan:   1. Asthma, moderate persistent, well-controlled   2. Other allergic rhinitis   3. LPRD (laryngopharyngeal reflux disease)   4. Pruritic disorder     1.  Continue to Treat and prevent inflammation:   A.  Montelukast 10 mg - 1 tablet 1 time per day  B.  Pulmicort 180 -2 inhalations 1-2 times per day   C.  Nasacort -1 spray each nostril 1-2 times per day  2.  Continue to Treat and prevent reflux:   A.  Protonix 40 mg in a.m.  B.  Famotidine 40 mg in p.m.    3.  Continue to treat and prevent itchiness:    A. cetirizine 10  mg -1-2 tablets 1-2 times a day (maximum 40mg )  B. Start gabapentin 100 mg - 1 tablet at bedtime  4.  If needed:   A.  OTC Benadryl  B.  OTC nasal saline  C.  Pro Air HFA 2 puffs every 4-6 hours  D.  Nasal saline  5. Hydration of skin with bath / shower followed by Vaseline while wet.  6. Call clinic in 2 weeks with update  Overall it sounds as though Demitria's respiratory tract issue is under pretty good control at this point in time while consistently using a leukotriene modifier and inhaled steroids and nasal steroids and her reflux is under good control as well.  Her pruritus is out of control.  She has not responded to very high-dose antihistamines.  We will now start her on gabapentin assuming that there may be some type of neurological issue contributing to her pruritus. We will start off with low-dose of gabapentin 100 mg  a day and see what happens over the course of the next 2 weeks and attempt to slowly escalate that dose of medication in the next several months.  Allena Katz, MD Allergy / Immunology [place]

## 2019-12-24 NOTE — Patient Instructions (Addendum)
  1.  Continue to Treat and prevent inflammation:   A.  Montelukast 10 mg - 1 tablet 1 time per day  B.  Pulmicort 180 -2 inhalations 1-2 times per day   C.  Nasacort -1 spray each nostril 1-2 times per day  2.  Continue to Treat and prevent reflux:   A.  Protonix 40 mg in a.m.  B.  Famotidine 40 mg in p.m.    3.  Continue to treat and prevent itchiness:    A. cetirizine 10 mg -1-2 tablets 1-2 times a day (maximum 40mg )  B. Start gabapentin 100 mg - 1 tablet at bedtime  4.  If needed:   A.  OTC Benadryl  B.  OTC nasal saline  C.  Pro Air HFA 2 puffs every 4-6 hours  D.  Nasal saline  5. Hydration of skin with bath / shower followed by Vaseline while wet.  6. Call clinic in 2 weeks with update

## 2019-12-25 ENCOUNTER — Encounter: Payer: Self-pay | Admitting: Allergy and Immunology

## 2019-12-27 ENCOUNTER — Encounter: Payer: Self-pay | Admitting: Student

## 2019-12-27 ENCOUNTER — Ambulatory Visit (INDEPENDENT_AMBULATORY_CARE_PROVIDER_SITE_OTHER): Payer: Medicaid Other | Admitting: Student

## 2019-12-27 ENCOUNTER — Other Ambulatory Visit: Payer: Self-pay

## 2019-12-27 VITALS — BP 138/82 | HR 82 | Temp 98.2°F | Ht 61.0 in | Wt 176.6 lb

## 2019-12-27 DIAGNOSIS — Z21 Asymptomatic human immunodeficiency virus [HIV] infection status: Secondary | ICD-10-CM

## 2019-12-27 DIAGNOSIS — R110 Nausea: Secondary | ICD-10-CM | POA: Diagnosis not present

## 2019-12-27 DIAGNOSIS — Z23 Encounter for immunization: Secondary | ICD-10-CM | POA: Diagnosis not present

## 2019-12-27 DIAGNOSIS — F411 Generalized anxiety disorder: Secondary | ICD-10-CM

## 2019-12-27 DIAGNOSIS — I10 Essential (primary) hypertension: Secondary | ICD-10-CM

## 2019-12-27 DIAGNOSIS — Z0000 Encounter for general adult medical examination without abnormal findings: Secondary | ICD-10-CM

## 2019-12-27 DIAGNOSIS — Z0001 Encounter for general adult medical examination with abnormal findings: Secondary | ICD-10-CM | POA: Diagnosis not present

## 2019-12-27 DIAGNOSIS — R42 Dizziness and giddiness: Secondary | ICD-10-CM | POA: Insufficient documentation

## 2019-12-27 DIAGNOSIS — F410 Panic disorder [episodic paroxysmal anxiety] without agoraphobia: Secondary | ICD-10-CM | POA: Diagnosis not present

## 2019-12-27 DIAGNOSIS — F419 Anxiety disorder, unspecified: Secondary | ICD-10-CM

## 2019-12-27 MED ORDER — BUPROPION HCL ER (XL) 150 MG PO TB24: ORAL_TABLET | ORAL | 3 refills | Status: DC

## 2019-12-27 NOTE — Progress Notes (Signed)
   CC: dizziness, nausea  HPI:  [name] is a 58 y.o. with secondary adrenal insufficiency, well-controlled HIV (CD4 780 10/2019), allergic rhinitis who presents to clinica today for dizziness and nausea.  Please see problem-based list for further details.  Past Medical History:  Diagnosis Date  . Allergic rhinitis 05/09/2006  . Allergy   . Anxiety   . Arthritis   . Asthma   . CHF (congestive heart failure) ([place])   . Chronic back pain   . Diabetes mellitus without complication ([place])   . GERD (gastroesophageal reflux disease)   . Heart murmur    as a child  . 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 s/p 12 week course of simeprevir and sofosbuvir between October 2014 and January 2015 with resolution.  Marland Kitchen History of shingles   . HIV infection ([place])    1994  . Hyperlipidemia    no meds taken now  . Hypertension   . Migraine   . Pituitary microadenoma ([place]) 08/08/2014  . Prediabetes   . Refusal of blood transfusions as patient is Jehovah's Witness   . Secondary adrenal insufficiency ([place]) 06/29/2014  . Urticaria    Review of Systems:  As per HPI.  Physical Exam:  Vitals:   12/27/19 1348 12/27/19 1350 12/27/19 1351 12/27/19 1354  BP: (!) 143/86 (!) 153/93 (!) 153/84 138/82  Pulse: 81 80 83 82  Temp: 98.2 F (36.8 C)     TempSrc: Oral     SpO2: 98%     Weight: 176 lb 9.6 oz (80.1 kg)     Height: 5\' 1"  (1.549 m)      Physical Exam Constitutional:      General: She is not in acute distress.    Appearance: She is not ill-appearing.  Cardiovascular:     Rate and Rhythm: Normal rate and regular rhythm.  Pulmonary:     Effort: Pulmonary effort is normal.     Breath sounds: Normal breath sounds.  Musculoskeletal:     Right lower leg: No edema.     Left lower leg: No edema.  Neurological:     Mental Status: She is alert.    Assessment & Plan:   See Encounters Tab for problem based charting.  Patient seen with Dr. Evette Doffing

## 2019-12-27 NOTE — Assessment & Plan Note (Signed)
Health Maintenance: Patient states she would like influenza vaccine today. Also requests more information on COVID-19 booster vaccine.  Last CA screenings: -Colonoscopy 2017 -Pap smear co-testing 2019 -Mammogram 2020  Assessment/Plan: -Influenza vaccine today -Discussed COVID-19 booster vaccine currently approved for immunocompromised individuals, including organ transplant and bone marrow recipients, patients on chemotherapy, etc. Encouraged patient to get booster vaccine whenever it becomes available.

## 2019-12-27 NOTE — Progress Notes (Signed)
Internal Medicine Clinic Attending  I saw and evaluated the patient.  I personally confirmed the key portions of the history and exam documented by Dr. Braswell and I reviewed pertinent patient test results.  The assessment, diagnosis, and plan were formulated together and I agree with the documentation in the resident's note.  

## 2019-12-27 NOTE — Assessment & Plan Note (Signed)
Hypertension: During visit today, BP 138/82. She brought her home BP journal, appears well-controlled with current medication regimen. Denies CP, SOB.  Assessment/Plan: -Continue metoprolol 25, amlodipine 10 -Encouraged patient to continue BP journal with a few recordings per week. -F/u 6 months  BP Readings from Last 3 Encounters:  12/27/19 138/82  12/24/19 122/80  11/27/19 (!) 153/100

## 2019-12-27 NOTE — Assessment & Plan Note (Signed)
GAD: Ms. Kadel states she has felt much better over the past month. Mentions her brother has been moved into a facility where he is watched after and there is less burden on herself. Discussed increased ability to concentrate, focus since he was moved. Continues to have some insomnia but states this is chronic. Denies SI/HI. Believes the Wellbutrin is causing her new-onset dizziness.  Assessment/Plan: -Appears as if much of her anxiety stemmed from the difficult situation with her brother. Given patient has experienced potential side effects from increase in medication and there is no longer a major stressor, will decrease Wellbutrin to original dose 150mg  QD. Patient agrees with plan. -F/u 73mo

## 2019-12-27 NOTE — Assessment & Plan Note (Signed)
Dizziness: Patient reports she has experienced multiple episodes of dizziness daily since increasing the Wellbutrin dosage. States these episodes often happen after standing up and cease after a few seconds. She has hx of BPPV, but reports this dizziness feels different. Mentions she feels like her BP drops when she stands up. Reports nausea is associated with dizziness. Denies falls, LOC. Discussed her listed centrally-acting drugs and potential side effects, including dizziness. States she no longer takes promethazine and has not begun taking gabapentin since it was prescribed a couple days ago for bedtime itching.   Assessment/Plan: -Orthostatic BP readings normal. Dix-Hallpike negative. -Patient has extensive past medical history with polypharmacy, likely related to her new-onset dizziness. -Patient would like to try her original Wellbutrin dosage (150mg  QD) since these symptoms started promptly after increasing.  -Also discussed taking Zyrtec at night instead of gabapentin for the bedtime itchiness. Encouraged patient to follow-up with clinic if symptoms persist.

## 2019-12-27 NOTE — Patient Instructions (Addendum)
[name] [name],  It was a pleasure seeing you today!  -During our visit we discussed the dizziness and nausea you have been experiencing lately. You have multiple medications that could be causing this. We will try decreasing your Wellbutrin to your original dosage and stopping the gabapentin.   -If you find yourself becoming more anxious or have continued dizziness, make another appointment to come and see [place].  -If you continue to have itching despite Zyrtec, please call us or Dr. Neldon Mc.  -Your blood pressure seems well-controlled on your current medications. Continue writing down your blood pressures a few times a week and bring them to your next appointment with [place].  We look forward to seeing you next time. Please call our clinic at [phone] if you have any questions or concerns. The best time to call is Monday-Friday from 9am-4pm, but there is someone available 24/7 at the same number. If you need medication refills, please notify your pharmacy one week in advance and they will send [place] a request.  Thank you for letting us take part in your care. Wishing you the best!  Thank you, Dr. [name], MD

## 2020-01-07 ENCOUNTER — Other Ambulatory Visit: Payer: Self-pay | Admitting: Allergy and Immunology

## 2020-01-07 ENCOUNTER — Other Ambulatory Visit: Payer: Self-pay | Admitting: Internal Medicine

## 2020-01-15 IMAGING — CR RIGHT HAND - COMPLETE 3+ VIEW
3 series · 3 of 3 positions shown · non-contrast
Comparison: No prior.

CLINICAL DATA: Right thumb pain.

EXAM:
RIGHT HAND - COMPLETE 3+ VIEW

[x hand pa right]
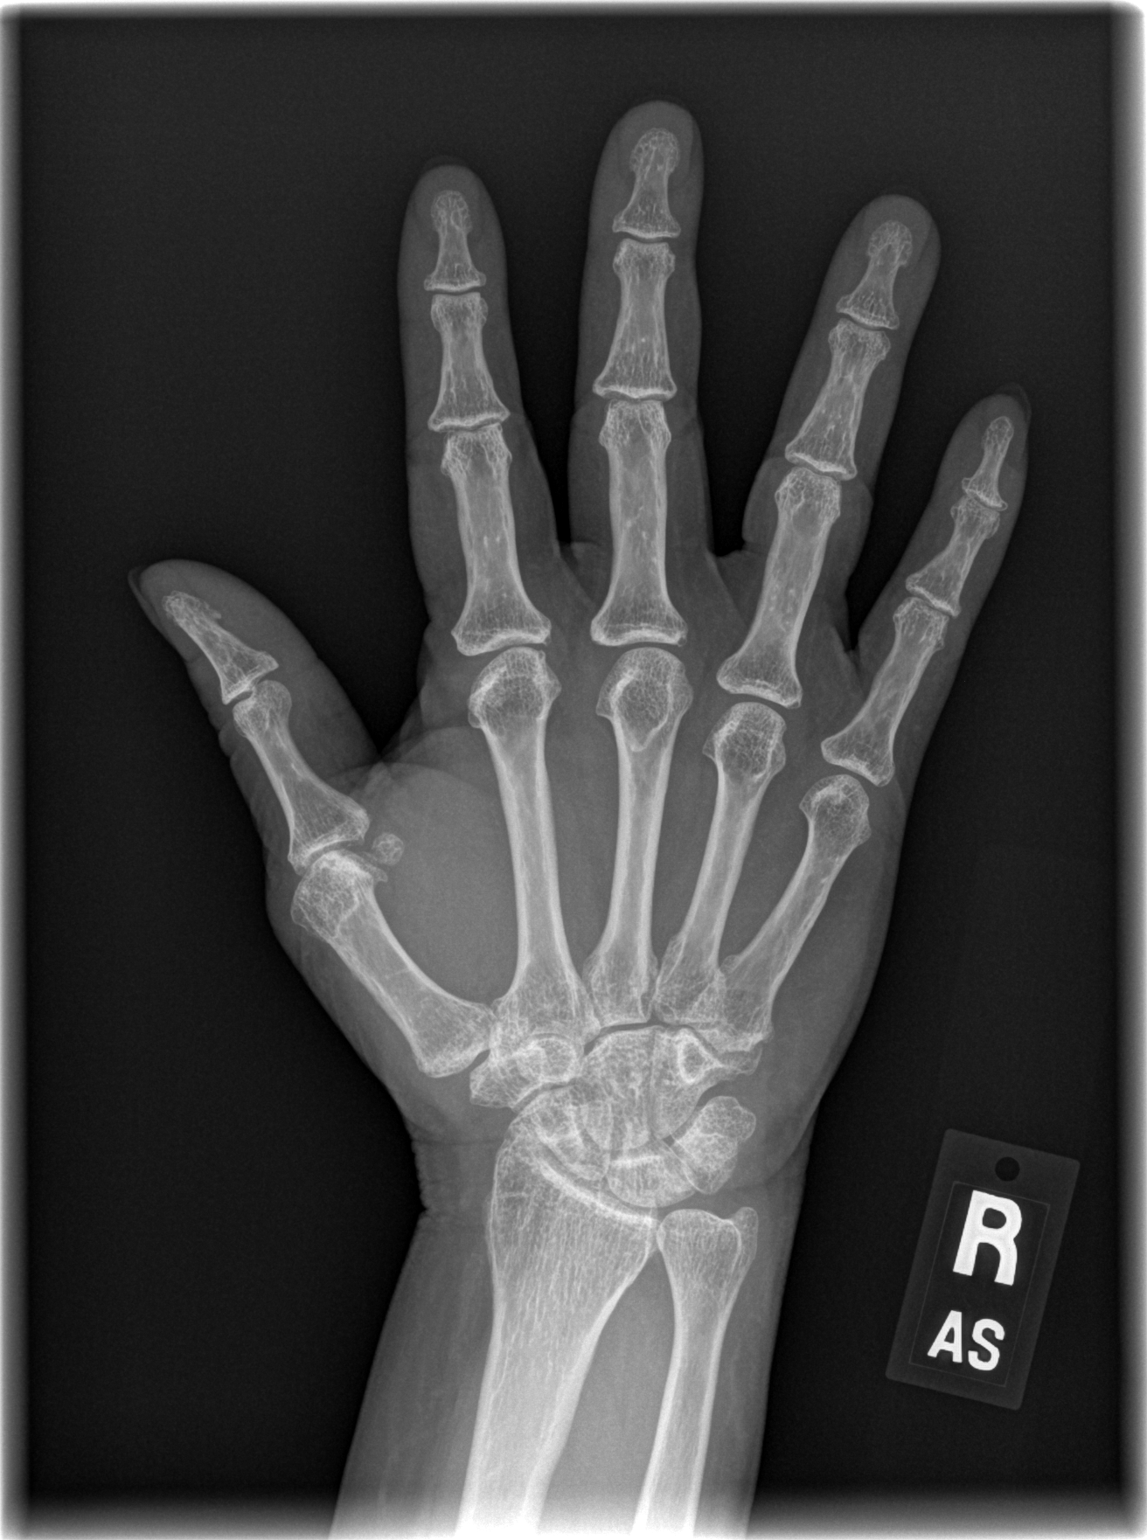

[x hand oblique right]
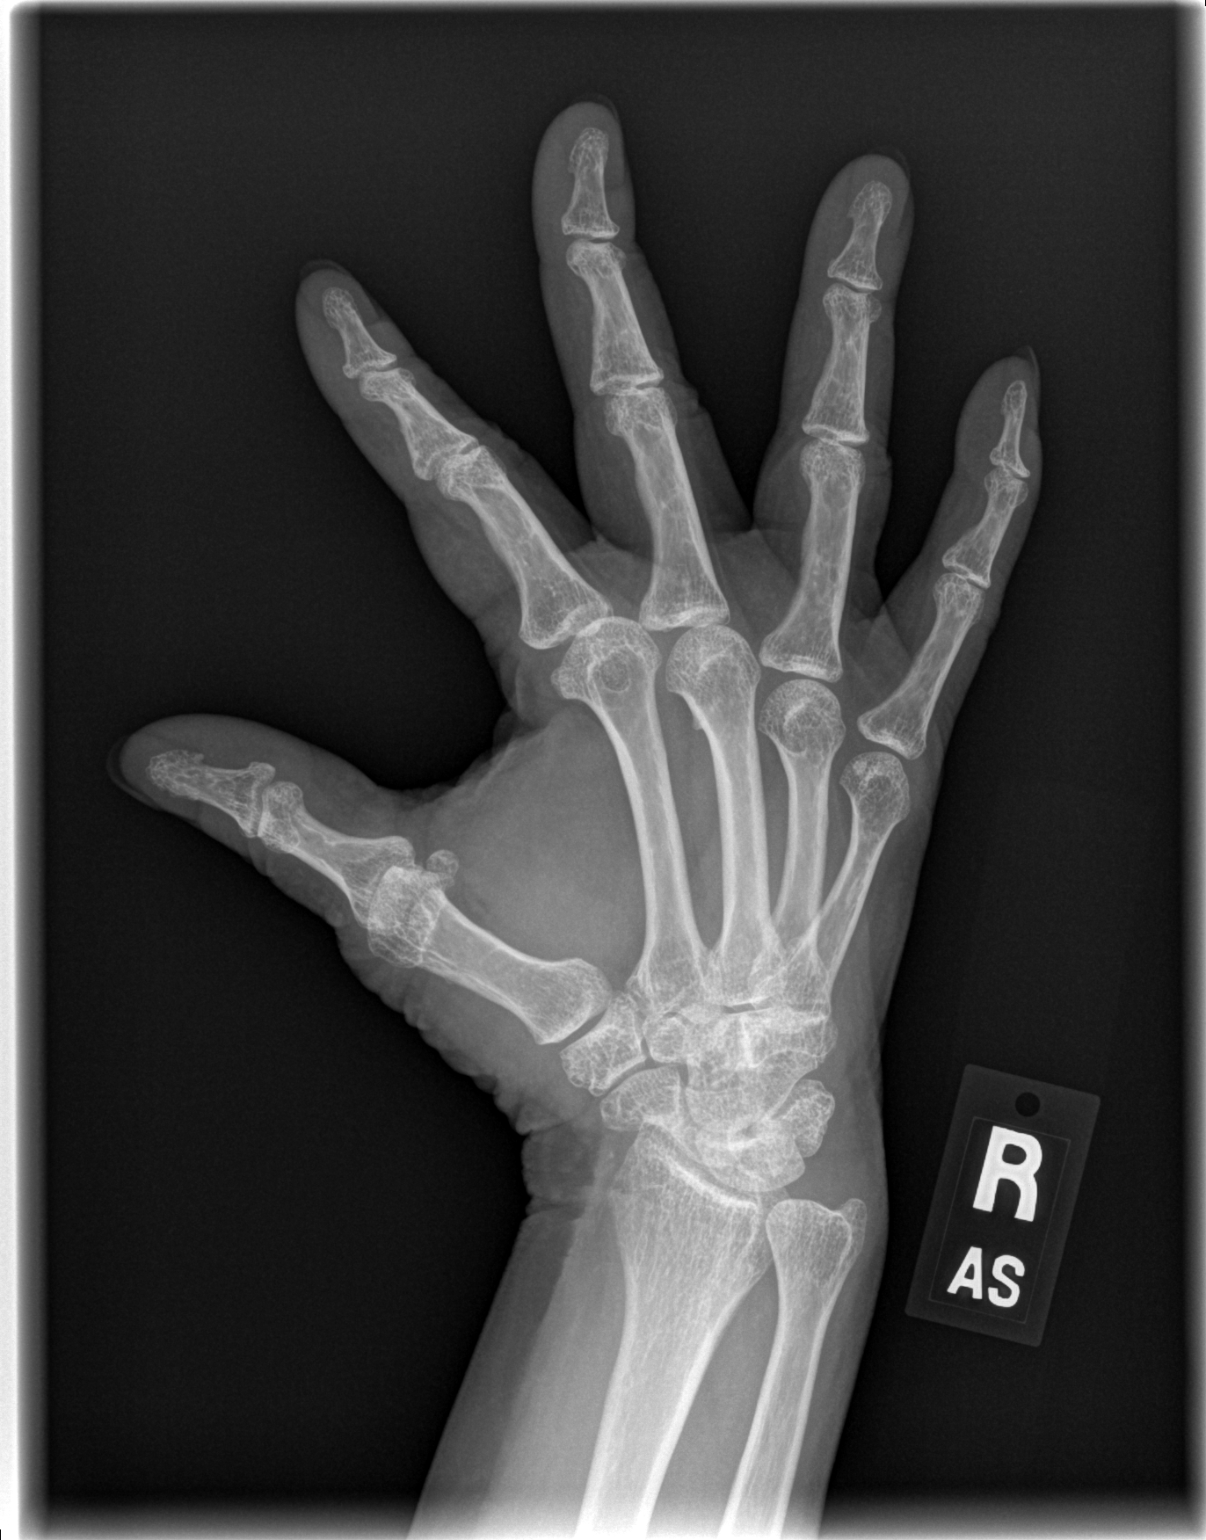

[x hand lat right]
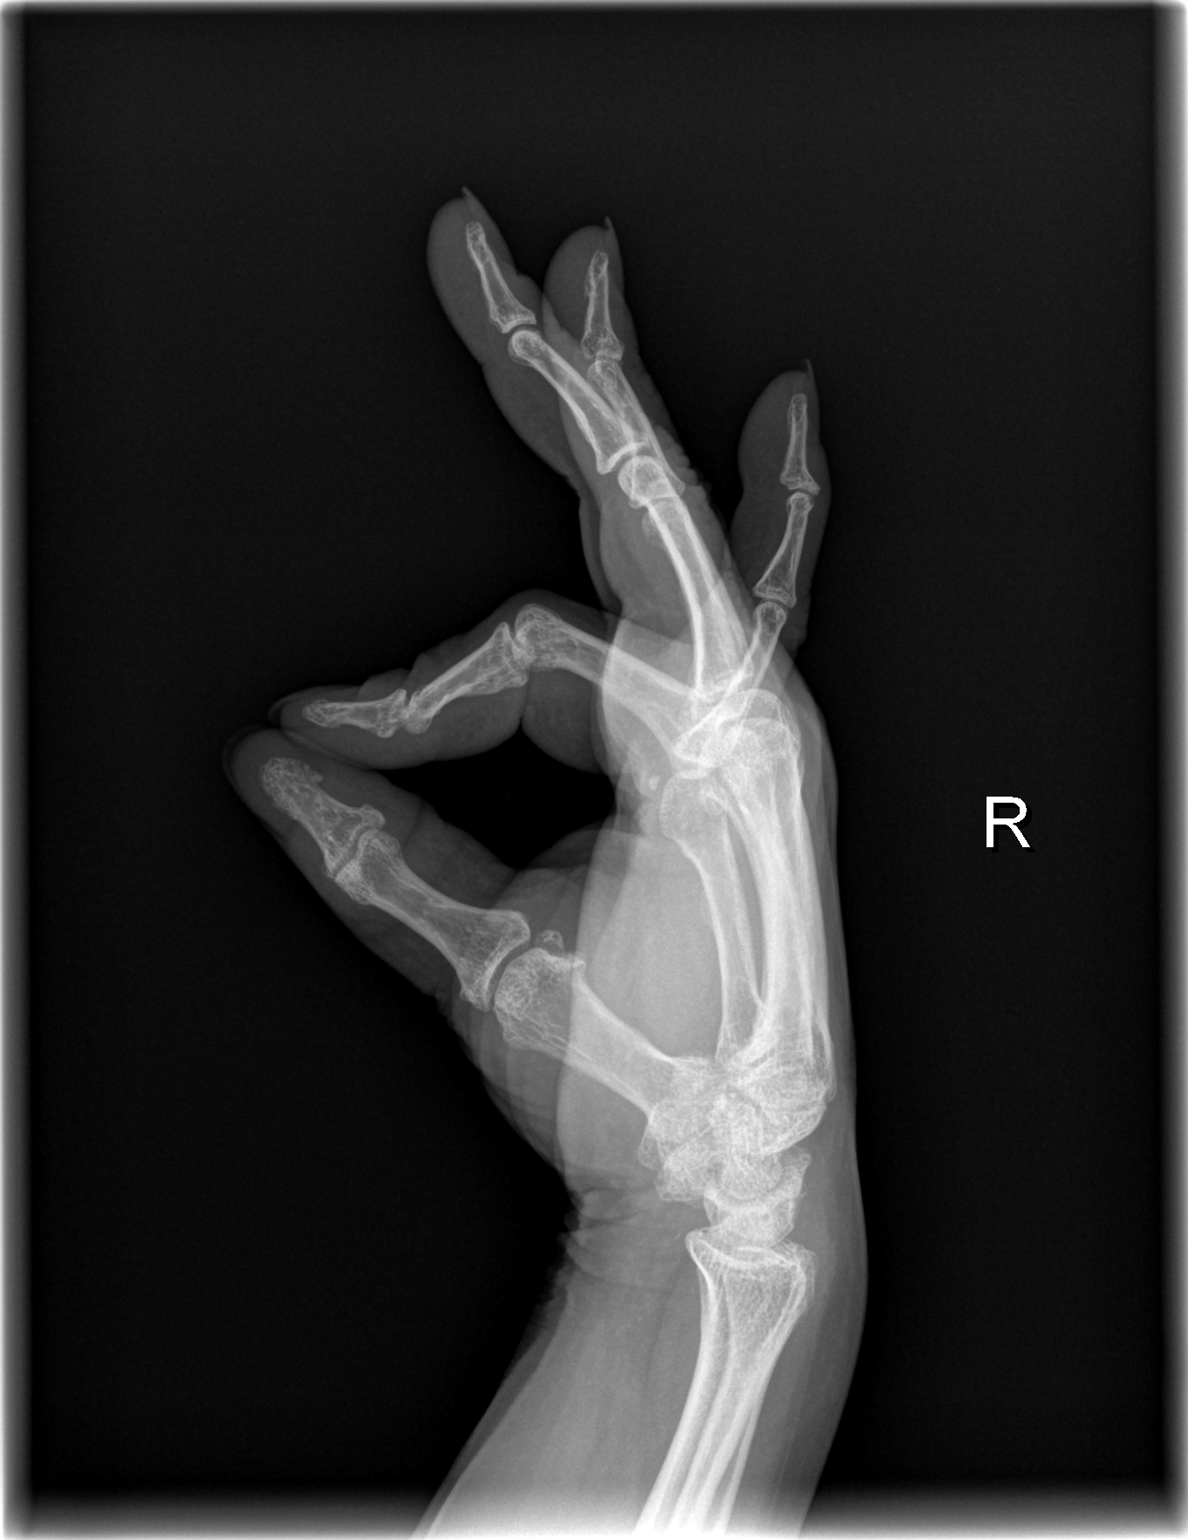

[3 of 3 positions shown; findings below may reference images not displayed]

FINDINGS: Diffuse osteopenia and degenerative change. No evidence of fracture
or dislocation. No acute abnormality identified.
IMPRESSION: Diffuse osteopenia and degenerative change.  No acute abnormality.

## 2020-01-20 DIAGNOSIS — E2749 Other adrenocortical insufficiency: Secondary | ICD-10-CM | POA: Diagnosis not present

## 2020-01-20 DIAGNOSIS — D352 Benign neoplasm of pituitary gland: Secondary | ICD-10-CM | POA: Diagnosis not present

## 2020-01-20 DIAGNOSIS — E119 Type 2 diabetes mellitus without complications: Secondary | ICD-10-CM | POA: Diagnosis not present

## 2020-01-27 ENCOUNTER — Telehealth: Payer: Self-pay | Admitting: Allergy and Immunology

## 2020-01-27 NOTE — Telephone Encounter (Signed)
Patient called and said that she is taking the  gabapentin at night and it is helping at night, but she is itching during the day and wanted to know if she could take it in the morning also. [phone]

## 2020-01-27 NOTE — Telephone Encounter (Signed)
Patient informed and verbalized understanding. She will call us back with a report about her response to increasing gabapentin to twice daily.

## 2020-01-27 NOTE — Telephone Encounter (Signed)
Please have [name] [name] increase dose of gabapentin to twice a day and report back about her response.

## 2020-01-28 ENCOUNTER — Ambulatory Visit: Payer: Medicaid Other | Attending: Internal Medicine

## 2020-01-28 DIAGNOSIS — Z23 Encounter for immunization: Secondary | ICD-10-CM

## 2020-01-28 NOTE — Progress Notes (Signed)
   Covid-19 Vaccination Clinic  Name:  [name] [name]    MRN: [id number] DOB: 07-Aug-1960  01/28/2020  [name] [name] was observed post Covid-19 immunization for 15 minutes without incident. She was provided with Vaccine Information Sheet and instruction to access the V-Safe system.   [name] [name] was instructed to call 911 with any severe reactions post vaccine: Marland Kitchen Difficulty breathing  . Swelling of face and throat  . A fast heartbeat  . A bad rash all over body  . Dizziness and weakness

## 2020-01-29 ENCOUNTER — Other Ambulatory Visit: Payer: Self-pay | Admitting: Student

## 2020-01-29 DIAGNOSIS — Z1231 Encounter for screening mammogram for malignant neoplasm of breast: Secondary | ICD-10-CM

## 2020-01-31 ENCOUNTER — Other Ambulatory Visit: Payer: Self-pay

## 2020-01-31 ENCOUNTER — Encounter: Payer: Medicaid Other | Attending: Physical Medicine & Rehabilitation | Admitting: Physical Medicine & Rehabilitation

## 2020-01-31 ENCOUNTER — Encounter: Payer: Self-pay | Admitting: Physical Medicine & Rehabilitation

## 2020-01-31 VITALS — BP 133/62 | HR 83 | Temp 98.3°F | Ht 61.0 in | Wt 174.0 lb

## 2020-01-31 DIAGNOSIS — M461 Sacroiliitis, not elsewhere classified: Secondary | ICD-10-CM | POA: Diagnosis not present

## 2020-01-31 NOTE — Patient Instructions (Signed)
Sacroiliac injection was performed today. A combination of a naming medicine plus a cortisone medicine was injected. The injection was done under x-ray guidance. This procedure has been performed to help reduce low back and buttocks pain as well as potentially hip pain. The duration of this injection is variable lasting from hours to  Months. It may repeated if needed. 

## 2020-01-31 NOTE — Progress Notes (Signed)
Bilateral  sacroiliac injection under fluoroscopic guidance  Indication: Bilateral Low back and buttocks pain not relieved by medication management and other conservative care.  History of HIV well managed, 11/13/2019 CD4 768   Informed consent was obtained after describing risks and benefits of the procedure with the patient, this includes bleeding, bruising, infection, paralysis and medication side effects. The patient wishes to proceed and has given written consent. The patient was placed in a prone position. The lumbar and sacral area was marked and prepped with Betadine. A 25-gauge 1-1/2 inch needle was inserted into the skin and subcutaneous tissue and 1 mL of 1% lidocaine was injected. Then a 25-gauge 3 inch spinal needle was inserted under fluoroscopic guidance into the left sacroiliac joint. AP and lateral images were utilized. Isovue 200x0.5 mL under live fluoroscopy demonstrated no intravascular uptake. Then a solution containing one ML of 6 mg per mLbetamethasone and 2 ML of 2% lidocaine MPF was injected x1.5 mL.Same procedure performed on the right with same technique and equipment.  Patient tolerated the procedure well. Post procedure instructions were given. Please see post procedure form. 

## 2020-01-31 NOTE — Progress Notes (Signed)
  PROCEDURE RECORD [facility]   Name: [name] [name] DOB:Sep 05, 1960 MRN: [id number]  Date:01/31/2020  Physician: Alysia Penna, MD    Nurse/CMA: Deletha Jaffee, CMA   Allergies:  Allergies  Allergen Reactions  . Acetaminophen Other (See Comments)    Inflamed liver, hospitalized REACTION: Had liver problems, was hospitalized Inflamed liver, hospitalized  . Morphine Hives and Rash    REACTION: rash  . Triamterene Hives  . Dyazide [Hydrochlorothiazide W-Triamterene] Hives  . Lisinopril     Cough   . Topamax [Topiramate]   . Gabapentin Anxiety  . Losartan Rash    Pt had rash, worsening dizziness, and nausea after starting losartan, which improved after stopping losartan    Consent Signed: Yes.    Is patient diabetic? Yes.    CBG today? 132  Pregnant: No. LMP: No LMP recorded. Patient is postmenopausal. (age 9-55)  Anticoagulants: no Anti-inflammatory: no Antibiotics: no  Procedure: Bilateral sacroiliac steroid injection Position: Prone Start Time: 11:30 am  End Time: 11:35am  Fluoro Time: 17s   RN/CMA Tationna Fullard, MA Karleen Seebeck, CMA    Time 11:20am 11:38am    BP 133/62 145/91    Pulse 83 87    Respirations 14 14    O2 Sat 97 96    S/S 6 6    Pain Level 10/10 10/10     D/C home with sister-n-law, [name] & O X 3, D/C instructions reviewed, and sits independently.

## 2020-02-04 ENCOUNTER — Other Ambulatory Visit: Payer: Self-pay | Admitting: Neurology

## 2020-02-04 ENCOUNTER — Other Ambulatory Visit: Payer: Self-pay | Admitting: Allergy and Immunology

## 2020-02-07 ENCOUNTER — Encounter: Payer: Self-pay | Admitting: Internal Medicine

## 2020-02-07 ENCOUNTER — Other Ambulatory Visit: Payer: Self-pay

## 2020-02-07 ENCOUNTER — Ambulatory Visit (INDEPENDENT_AMBULATORY_CARE_PROVIDER_SITE_OTHER): Payer: Medicaid Other | Admitting: Internal Medicine

## 2020-02-07 DIAGNOSIS — M79601 Pain in right arm: Secondary | ICD-10-CM | POA: Diagnosis present

## 2020-02-07 MED ORDER — DICLOFENAC SODIUM 1 % EX GEL: 4.0000 g | Freq: Four times a day (QID) | CUTANEOUS | 2 refills | Status: DC

## 2020-02-07 NOTE — Patient Instructions (Addendum)
[name] [name],  It was a pleasure seeing you in clinic. Today we discussed:   Arm pain: At this time, we believe this is secondary to tendonitis. Please apply voltaren gel up to 4 times per day. Follow up with orthopedics as scheduled; or with [place] sooner if needed.  If you have any questions or concerns, please call our clinic at [phone] between 9am-5pm and after hours call [phone] and ask for the internal medicine resident on call. If you feel you are having a medical emergency please call 911.   Thank you, we look forward to helping you remain healthy!   Distal Biceps Tendinitis  Distal biceps tendinitis is inflammation of the distal biceps tendon. This tendon is a strong cord of tissue that connects the biceps muscle on the front of the upper arm to the radius bone in the elbow. Distal biceps tendinitis can interfere with the ability to bend the elbow and to turn the hand palm up (supination). Distal biceps tendinitis may include a grade 1 or grade 2 strain of the tendon.  A grade 1 strain is mild. There is a slight pull of the tendon without any stretching or tearing of the tendon. There is also usually no loss of biceps muscle strength.  A grade 2 strain is moderate. There is a small tear in the tendon. The tendon is stretched, and biceps muscle strength is usually decreased. This condition is most often caused by overusing the elbow joint and the biceps muscle. It usually heals within 6 weeks. What are the causes? This condition may be caused by:  A sudden increase in the frequency or intensity of activity that involves the elbow and the biceps muscle.  Overuse of the biceps muscle. This can happen when you do the same movements over and over, such as: ? Turning your hand palm up. ? Forceful straightening (hyperextension) of the elbow. ? Bending of the elbow.  A direct, forceful hit or injury (trauma) to the elbow. This is rare. What increases the risk? The following factors  may make you more likely to develop this condition:  Playing contact sports.  Playing sports that involve throwing and overhead movements, including racket sports, gymnastics, weight lifting, or bodybuilding.  Doing physical labor.  Having poor strength and flexibility of the arm and shoulder.  Having injured other parts of the elbow. What are the signs or symptoms? Symptoms of this condition may include:  Pain and inflammation in the front of the elbow. Pain may get worse during certain movements, such as: ? Turning your hand palm up. ? Bending your elbow. ? Lifting or carrying objects. ? Throwing.  A feeling of warmth in the front of your elbow.  A feeling of weakness.  A crackling sound (crepitation) when you move or touch the elbow or the upper arm. In some cases, symptoms may return (recur) after treatment, and they may be long-lasting (chronic). How is this diagnosed? This condition is diagnosed based on:  Your symptoms.  Your medical history.  A physical exam. Your health care provider may have you do arm movements to test your range of motion.  You may have other tests, including: ? X-rays. ? MRI. How is this treated? Treatment for this condition depends on the severity of the injury. You may be treated by:  Resting the injured arm. Avoid flexing the elbow.  Applying ice or heat to the injured area.  Taking medicines to relieve pain and inflammation.  Ultrasound therapy. This applies sound waves  to the injured area.  Physical therapy. Follow these instructions at home: Managing pain, stiffness, and swelling      If directed, put ice on the injured area. ? Put ice in a plastic bag. ? Place a towel between your skin and the bag. ? Leave the ice on for 20 minutes, 2-3 times a day.  If directed, apply heat to the affected area before you exercise. Use the heat source that your health care provider recommends, such as a moist heat pack or a heating  pad. ? Place a towel between your skin and the heat source. ? Leave the heat on for 20-30 minutes. ? Remove the heat if your skin turns bright red. This is especially important if you are unable to feel pain, heat, or cold. You may have a greater risk of getting burned.  Move your fingers often to reduce stiffness and swelling.  Raise (elevate) the injured area above the level of your heart while you are sitting or lying down. Activity  Do not lift anything that is heavier than 10 lb (4.5 kg), or the limit that you are told, until your health care provider says that it is safe.  Avoid activities that cause pain or make your condition worse.  Return to your normal activities as told by your health care provider. Ask your health care provider what activities are safe for you.  Do exercises as told by your health care provider. General instructions  Take over-the-counter and prescription medicines only as told by your health care provider.  Do not use any products that contain nicotine or tobacco, such as cigarettes, e-cigarettes, and chewing tobacco. These can delay healing. If you need help quitting, ask your health care provider.  Keep all follow-up visits as told by your health care provider. This is important. How is this prevented?  Warm up and stretch before being active.  Cool down and stretch after being active.  Give your body time to rest between periods of activity.  Make sure to use equipment that fits you.  Be safe and responsible while being active. This will help you avoid falls.  Maintain physical fitness, including: ? Strength. ? Flexibility. ? Heart health (cardiovascular fitness). ? The ability to use muscles for a long time (endurance). Contact a health care provider if:  You have symptoms that get worse or do not get better after 2 weeks of treatment.  You develop new symptoms. Get help right away if:  You develop severe pain. Summary  Distal  biceps tendinitis is inflammation of the distal biceps tendon.  This injury is caused by a sudden increase in the frequency or intensity of activity or overuse of the biceps muscle.  Distal biceps tendinitis can interfere with the ability to bend the elbow and to turn the hand palm up.  This condition is treated with rest, ice, heat, physical therapy, and pain medicines if needed.  Follow activity and lifting restrictions as told by your health care provider. This information is not intended to replace advice given to you by your health care provider. Make sure you discuss any questions you have with your health care provider. Document Revised: 08/07/2018 Document Reviewed: 04/10/2018 Elsevier Patient Education  [place].

## 2020-02-07 NOTE — Progress Notes (Signed)
Diclofenac 1% Gel rx faxed to [facility] per pt's request.

## 2020-02-07 NOTE — Progress Notes (Signed)
   CC: R arm pain  HPI:  [name] is a 59 y.o. female with PMHx as listed below presenting for evaluation of right arm pain. She endorses right arm pain for one week duration for which she has tried ibuprofen, heat and ice without significant relief. Denies any strenous activity or trauma to the area. Worse with stretching or raising against gravity.  Please see problem based charting for complete assessment and plan.  Past Medical History:  Diagnosis Date  . Allergic rhinitis 05/09/2006  . Allergy   . Anxiety   . Arthritis   . Asthma   . CHF (congestive heart failure) ([place])   . Chronic back pain   . Diabetes mellitus without complication ([place])   . GERD (gastroesophageal reflux disease)   . Heart murmur    as a child  . 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 s/p 12 week course of simeprevir and sofosbuvir between October 2014 and January 2015 with resolution.  Marland Kitchen History of shingles   . HIV infection ([place])    1994  . Hyperlipidemia    no meds taken now  . Hypertension   . Migraine   . Pituitary microadenoma ([place]) 08/08/2014  . Prediabetes   . Refusal of blood transfusions as patient is Jehovah's Witness   . Secondary adrenal insufficiency ([place]) 06/29/2014  . Urticaria    Review of Systems:  Negative except as stated in HPI.  Physical Exam:  Vitals:   02/07/20 1402  Weight: 177 lb 8 oz (80.5 kg)  Height: 5\' 1"  (1.549 m)   Physical Exam  Constitutional: Appears well-developed and well-nourished. No distress.  HENT: Normocephalic and atraumatic, EOMI, conjunctiva normal, moist mucous membranes Cardiovascular: Normal rate, regular rhythm, S1 and S2 present, no murmurs, rubs, gallops.  Distal pulses intact Respiratory: No respiratory distress, no accessory muscle use.  Effort is normal.  Lungs are clear to auscultation bilaterally. GI: Nondistended, soft, nontender to palpation, normal active bowel sounds Musculoskeletal: Normal  bulk and tone.  No peripheral edema noted. R arm: No bruising, rash, or lesions identified; no tenderness to palpation of biceps, triceps or brachioradialis or bony prominence at the olecranon process Tenderness at medial aspect of arm with flexion, supination and 90 degree flexion against gravity with arm in L shape  Neurological: Is alert and oriented x4, no apparent focal deficits noted. Skin: Warm and dry.  No rash, erythema, lesions noted. Psychiatric: Normal mood and affect. Behavior is normal. Judgment and thought content normal.    Assessment & Plan:   See Encounters Tab for problem based charting.  Patient discussed with Dr. Jimmye Norman

## 2020-02-07 NOTE — Telephone Encounter (Signed)
Patient states taking the gabapentin twice a day instead of once is most definitely helping. Patient needs another refill on this medication since she is doubling the dose. Uses [facility]  Please advise.

## 2020-02-07 NOTE — Telephone Encounter (Signed)
Dr. Neldon Mc patient is reporting improvement with gabapentin twice daily. Is it okay to send in med refills reflecting change?

## 2020-02-10 ENCOUNTER — Encounter: Payer: Medicaid Other | Admitting: Internal Medicine

## 2020-02-10 DIAGNOSIS — M79603 Pain in arm, unspecified: Secondary | ICD-10-CM | POA: Insufficient documentation

## 2020-02-10 MED ORDER — GABAPENTIN 100 MG PO CAPS: 100.0000 mg | ORAL_CAPSULE | Freq: Two times a day (BID) | ORAL | 5 refills | Status: DC

## 2020-02-10 NOTE — Telephone Encounter (Signed)
Prescription sent to patient's pharmacy. Patient has been notified.

## 2020-02-10 NOTE — Progress Notes (Signed)
Virtual Visit via Telephone Note   This visit type was conducted due to national recommendations for restrictions regarding the COVID-19 Pandemic (e.g. social distancing) in an effort to limit this patient's exposure and mitigate transmission in our community.  Due to her co-morbid illnesses, this patient is at least at moderate risk for complications without adequate follow up.  This format is felt to be most appropriate for this patient at this time.  The patient did not have access to video technology/had technical difficulties with video requiring transitioning to audio format only (telephone).  All issues noted in this document were discussed and addressed.  No physical exam could be performed with this format.  Please refer to the patient's chart for her  consent to telehealth for [facility].    Date:  02/11/2020   ID:  [name] [name], DOB [birth year], MRN [id number] The patient was identified using 2 identifiers.  Patient Location: Home Provider Location: Home Office  PCP:  [name] Dame, MD  Cardiologist:  [name] Rouge, MD Electrophysiologist:  None   Evaluation Performed:  Follow-Up Visit  Chief Complaint:  Chest pain, hx of and continues   History of Present Illness:    [name] [name] is a 59 y.o. female with history of Rx Hepatitis C, HIV followed by ID Dr [name] [name]. 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 History of GERD, CKD and pancreatitis.  Jehovah's witness no blood products. History of atypical chest pain with normal stress echo in 2016 and normal Lexiscan Myovue in 2018 GERD with difficulty laying flat due to reflux.  She continues to smoke   Pain is chest atypical Also with some exertional dyspnea and palpitations    Last visit 05/10/18   Today she is doing well. No chest pain except for the same atypical pain she has had since 2018, no changes. Her HIV is undetectable. She tells me her diabetes has been stable. Last lipids are managed with LDL 81 and hdl  40 on lipitor 5, higher doses causes her to feel bad.  She tries to eat healthy but no exercise though active with shopping.   . The patient does not have symptoms concerning for COVID-19 infection (fever, chills, cough, or new shortness of breath).  She has not had Covid but both brothers did, one may need dialysis.  She has had both vaccines and booster and flu vaccine.     Past Medical History:  Diagnosis Date  . Allergic rhinitis 05/09/2006  . Allergy   . Anxiety   . Arthritis   . Asthma   . CHF (congestive heart failure) ([place])   . Chronic back pain   . Diabetes mellitus without complication ([place])   . GERD (gastroesophageal reflux disease)   . Heart murmur    as a child  . 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 s/p 12 week course of simeprevir and sofosbuvir between October 2014 and January 2015 with resolution.  Marland Kitchen History of shingles   . HIV infection ([place])    1994  . Hyperlipidemia    no meds taken now  . Hypertension   . Migraine   . Pituitary microadenoma ([place]) 08/08/2014  . Prediabetes   . Refusal of blood transfusions as patient is Jehovah's Witness   . Secondary adrenal insufficiency ([place]) 06/29/2014  . Urticaria    Past Surgical History:  Procedure Laterality Date  . BUNIONECTOMY     b/l  . COLECTOMY  2003 for diverticulitis, had colostomy bag and then reversed  . COLONOSCOPY    . HAND SURGERY    . NASAL SINUS SURGERY    . SHOULDER SURGERY     left  . TONSILLECTOMY       Current Meds  Medication Sig  . amLODipine (NORVASC) 10 MG tablet TAKE ONE TABLET BY MOUTH DAILY  . atorvastatin (LIPITOR) 10 MG tablet Take 0.5 tablets (5 mg total) by mouth daily.  . bictegravir-emtricitabine-tenofovir AF (BIKTARVY) 50-200-25 MG TABS tablet Take 1 tablet by mouth daily.  . budesonide (PULMICORT FLEXHALER) 180 MCG/ACT inhaler 2 inhalations 2 times per day. Rinse, gargle, and spit after use.  Marland Kitchen buPROPion (WELLBUTRIN XL) 150 MG 24 hr  tablet Take one tablet in the morning  . cetirizine (ZYRTEC) 10 MG tablet 1 tablet daily  . diclofenac Sodium (VOLTAREN) 1 % GEL Apply 4 g topically 4 (four) times daily.  . Elastic Bandages & Supports (WRIST/THUMB SPLINT/RIGHT MED) MISC Wear on R thumb/wrist.  . famotidine (PEPCID) 40 MG tablet TAKE ONE TABLET BY MOUTH AT BEDTIME  . gabapentin (NEURONTIN) 100 MG capsule Take 1 capsule (100 mg total) by mouth 2 (two) times daily.  . hydrocortisone (CORTEF) 10 MG tablet Take 10 mg by mouth as directed. Pt takes 10 mg in the am and 5 mg at 11 am and 3 pm  . liraglutide (VICTOZA) 18 MG/3ML SOPN Inject into the skin.  . metoprolol tartrate (LOPRESSOR) 25 MG tablet TAKE ONE TABLET BY MOUTH TWICE A DAY  . montelukast (SINGULAIR) 10 MG tablet TAKE ONE TABLET BY MOUTH AT BEDTIME  . nitroGLYCERIN (NITROSTAT) 0.4 MG SL tablet Place 1 tablet (0.4 mg total) under the tongue every 5 (five) minutes as needed for chest pain.  . nortriptyline (PAMELOR) 10 MG capsule TAKE TWO CAPSULES BY MOUTH AT BEDTIME  . pantoprazole (PROTONIX) 40 MG tablet TAKE ONE TABLET BY MOUTH DAILY  . PROAIR HFA 108 (90 Base) MCG/ACT inhaler INHALE TWO PUFFS BY MOUTH EVERY FOUR TO SIX HOURS AS NEEDED FOR COUGH OR WHEEZE  . promethazine (PHENERGAN) 25 MG tablet Take 1 tablet (25 mg total) by mouth every 6 (six) hours as needed for nausea or vomiting.  . sodium chloride (OCEAN) 0.65 % SOLN nasal spray PLACE 1 SPRAY INTO BOTH NOSTRILS AS NEEDED FOR CONGESTION.  Marland Kitchen triamcinolone (NASACORT) 55 MCG/ACT AERO nasal inhaler 1 spray each nostril 2 times per day  . Vitamin D, Cholecalciferol, 25 MCG (1000 UT) CAPS Take 1,000 mg by mouth daily.  . [DISCONTINUED] NEOMYCIN-POLYMYXIN-HYDROCORTISONE (CORTISPORIN) 1 % SOLN OTIC solution PLACE THREE DROPS INTO THE LEFT EAR THREE TIMES DAILY FOR SEVEN DAYS.  . [DISCONTINUED] nitroGLYCERIN (NITROSTAT) 0.4 MG SL tablet Place 1 tablet (0.4 mg total) under the tongue every 5 (five) minutes as needed for chest  pain.  . [DISCONTINUED] promethazine-dextromethorphan (PROMETHAZINE-DM) 6.25-15 MG/5ML syrup Take 5 mLs by mouth 4 (four) times daily as needed for cough.     Allergies:   Acetaminophen, Morphine, Triamterene, Dyazide [hydrochlorothiazide w-triamterene], Lisinopril, Topamax [topiramate], Gabapentin, and Losartan   Social History   Tobacco Use  . Smoking status: Former Smoker    Years: 20.00    Types: Cigarettes    Quit date: 04/26/2007    Years since quitting: 12.8  . Smokeless tobacco: Never Used  . Tobacco comment: QUIT 2009  Vaping Use  . Vaping Use: Never used  Substance Use Topics  . Alcohol use: No    Alcohol/week: 0.0 standard drinks  . Drug use:  No     Family Hx: The patient's family history includes Allergic rhinitis in her sister and sister; Asthma in her sister and sister; Cancer in her maternal aunt, maternal aunt, and sister; Colon polyps in her brother; Diabetes in her father and mother; Heart disease in her father and mother; Hepatitis in her sister; Renal Disease in her brother; Stroke in her father, mother, and another family member. There is no history of Colon cancer, Esophageal cancer, Stomach cancer, Rectal cancer, Angioedema, Eczema, or Urticaria.  ROS:   Please see the history of present illness.    General:no colds or fevers, no weight changes Skin:no rashes or ulcers HEENT:no blurred vision, no congestion CV:see HPI PUL:see HPI GI:no diarrhea constipation or melena, no indigestion GU:no hematuria, no dysuria MS:no joint pain, no claudication Neuro:no syncope, no lightheadedness Endo:+ diabetes, no thyroid disease    All other systems reviewed and are negative.   Prior CV studies:   The following studies were reviewed today:  Lexiscan Myoview 10/10/2016  Nuclear stress EF: 63%.  There was no ST segment deviation noted during stress.  The study is normal.  The left ventricular ejection fraction is normal (55-65%).  1. EF 63%, normal wall  motion.  2. No evidence for ischemia or infarction on perfusion images.   Normal study.   Stress Echo 10/21/14 Study Conclusions - Stress ECG conclusions: The stress ECG was normal. - Baseline: LV global systolic function was normal. The estimated LV ejection fraction was 60%. Normal wall motion; no LV regional wall motion abnormalities. - Peak stress: LV global systolic function was vigorous. Normal wall motion; no LV regional wall motion abnormalities. - Impressions: Normal increase in thickening and contractility in all segments. This is a normal stress echo study.  Impressions: - Normal increase in thickening and contractility in all segments. This is a normal stress echo study.  05/22/18 Normal nuc   Labs/Other Tests and Data Reviewed:    EKG:  No ECG reviewed.  Recent Labs: 11/13/2019: ALT 18; BUN 13; Creat 0.92; Hemoglobin 14.0; Platelets 253; Potassium 4.3; Sodium 140   Recent Lipid Panel Lab Results  Component Value Date/Time   CHOL 159 11/13/2019 01:51 PM   TRIG 286 (H) 11/13/2019 01:51 PM   HDL 40 (L) 11/13/2019 01:51 PM   CHOLHDL 4.0 11/13/2019 01:51 PM   LDLCALC 81 11/13/2019 01:51 PM    Wt Readings from Last 3 Encounters:  02/11/20 174 lb (78.9 kg)  02/07/20 177 lb 8 oz (80.5 kg)  01/31/20 174 lb (78.9 kg)     Risk Assessment/Calculations:      Objective:    Vital Signs:  BP 136/81   Pulse 90   Ht 5\' 1"  (1.549 m)   Wt 174 lb (78.9 kg)   BMI 32.88 kg/m    VITAL SIGNS:  reviewed General vice is strong and NAD Pulmonary can complete sentences without SOB  Neuro alert and oriented X 3  Pleasant affect  ASSESSMENT & PLAN:    1. Atypical chest pain without changes  2. HTN controlled continue meds 3. HLD controlled continue statin 4. Hx HIV undetectable   COVID-19 Education: The signs and symptoms of COVID-19 were discussed with the patient and how to seek care for testing (follow up with PCP or arrange E-visit).  The  importance of social distancing was discussed today.  Time:   Today, I have spent 10 minutes with the patient with telehealth technology discussing the above problems.     Medication Adjustments/Labs and  Tests Ordered: Current medicines are reviewed at length with the patient today.  Concerns regarding medicines are outlined above.   Tests Ordered: No orders of the defined types were placed in this encounter.   Medication Changes: Meds ordered this encounter  Medications  . nitroGLYCERIN (NITROSTAT) 0.4 MG SL tablet    Sig: Place 1 tablet (0.4 mg total) under the tongue every 5 (five) minutes as needed for chest pain.    Dispense:  25 tablet    Refill:  1    Follow Up:  In Person in 10 month(s)  Signed, Cecilie Kicks, NP  02/11/2020 8:36 AM    [place]

## 2020-02-10 NOTE — Assessment & Plan Note (Signed)
[name] [name] has a history of osteoarthritis. She is presenting with right arm pain for one week duration that extends from her mid-biceps to the mid brachioradialis muscle that is worse with movements (especially flexion). She notes that she has been trying to treat it at home with ibuprofen, heat and ice with intermittent improvement in symptoms; however, her pain returns once the ibuprofen wears off. She denies any trauma to the area or strenuous activity. No similar history of pain. On examination, patient does not have any apparent bruising, lesions or rashes noted. She does not have any tenderness to palpation of the biceps, triceps or brachioradialis. No tenderness over the olecranon process or bony abnormalities appreciated. Pain is reproducible at medial anterior arm with flexion to 90 degrees and formation of L shape of the right arm. Also has some pain with supination of the right arm. Examination is not consistent with medial or lateral epicondylitis. Suspect possible distal bicep tendonitis.   Plan: Voltaren gel qid prn Continue with heat/ice application If no significant improvement in symptoms, consider imaging

## 2020-02-10 NOTE — Addendum Note (Signed)
Addended by: Farrel Demark R on: 02/10/2020 01:46 PM   Modules accepted: Orders

## 2020-02-10 NOTE — Telephone Encounter (Signed)
Please prescribe gabapentin 2 times per day

## 2020-02-11 ENCOUNTER — Encounter: Payer: Self-pay | Admitting: Cardiology

## 2020-02-11 ENCOUNTER — Ambulatory Visit: Payer: Medicaid Other | Admitting: Neurology

## 2020-02-11 ENCOUNTER — Encounter: Payer: Self-pay | Admitting: Neurology

## 2020-02-11 ENCOUNTER — Telehealth: Payer: Self-pay | Admitting: *Deleted

## 2020-02-11 ENCOUNTER — Telehealth (INDEPENDENT_AMBULATORY_CARE_PROVIDER_SITE_OTHER): Payer: Medicaid Other | Admitting: Cardiology

## 2020-02-11 ENCOUNTER — Other Ambulatory Visit: Payer: Self-pay

## 2020-02-11 VITALS — BP 136/81 | HR 90 | Ht 61.0 in | Wt 174.0 lb

## 2020-02-11 VITALS — BP 122/74 | HR 79 | Ht 61.0 in | Wt 177.0 lb

## 2020-02-11 DIAGNOSIS — E785 Hyperlipidemia, unspecified: Secondary | ICD-10-CM | POA: Diagnosis not present

## 2020-02-11 DIAGNOSIS — I10 Essential (primary) hypertension: Secondary | ICD-10-CM | POA: Diagnosis not present

## 2020-02-11 DIAGNOSIS — E1169 Type 2 diabetes mellitus with other specified complication: Secondary | ICD-10-CM | POA: Diagnosis not present

## 2020-02-11 DIAGNOSIS — B20 Human immunodeficiency virus [HIV] disease: Secondary | ICD-10-CM

## 2020-02-11 DIAGNOSIS — G43709 Chronic migraine without aura, not intractable, without status migrainosus: Secondary | ICD-10-CM

## 2020-02-11 DIAGNOSIS — R079 Chest pain, unspecified: Secondary | ICD-10-CM | POA: Diagnosis not present

## 2020-02-11 MED ORDER — NITROGLYCERIN 0.4 MG SL SUBL: 0.4000 mg | SUBLINGUAL_TABLET | SUBLINGUAL | 1 refills | Status: DC | PRN

## 2020-02-11 MED ORDER — NORTRIPTYLINE HCL 10 MG PO CAPS: 20.0000 mg | ORAL_CAPSULE | Freq: Every day | ORAL | 4 refills | Status: DC

## 2020-02-11 NOTE — Telephone Encounter (Signed)
°  Patient Consent for Virtual Visit         [name] [name] has provided verbal consent on 02/11/2020 for a virtual visit (video or telephone).   CONSENT FOR VIRTUAL VISIT FOR:  [name] [name]  By participating in this virtual visit I agree to the following:  I hereby voluntarily request, consent and authorize [facility] and its employed or contracted physicians, physician assistants, nurse practitioners or other licensed health care professionals (the Practitioner), to provide me with telemedicine health care services (the Services") as deemed necessary by the treating Practitioner. I acknowledge and consent to receive the Services by the Practitioner via telemedicine. I understand that the telemedicine visit will involve communicating with the Practitioner through live audiovisual communication technology and the disclosure of certain medical information by electronic transmission. I acknowledge that I have been given the opportunity to request an in-person assessment or other available alternative prior to the telemedicine visit and am voluntarily participating in the telemedicine visit.  I understand that I have the right to withhold or withdraw my consent to the use of telemedicine in the course of my care at any time, without affecting my right to future care or treatment, and that the Practitioner or I may terminate the telemedicine visit at any time. I understand that I have the right to inspect all information obtained and/or recorded in the course of the telemedicine visit and may receive copies of available information for a reasonable fee.  I understand that some of the potential risks of receiving the Services via telemedicine include:   Delay or interruption in medical evaluation due to technological equipment failure or disruption;  Information transmitted may not be sufficient (e.g. poor resolution of images) to allow for appropriate medical decision making by the Practitioner;  and/or   In rare instances, security protocols could fail, causing a breach of personal health information.  Furthermore, I acknowledge that it is my responsibility to provide information about my medical history, conditions and care that is complete and accurate to the best of my ability. I acknowledge that Practitioner's advice, recommendations, and/or decision may be based on factors not within their control, such as incomplete or inaccurate data provided by me or distortions of diagnostic images or specimens that may result from electronic transmissions. I understand that the practice of medicine is not an exact science and that Practitioner makes no warranties or guarantees regarding treatment outcomes. I acknowledge that a copy of this consent can be made available to me via my patient portal ([facility]), or I can request a printed copy by calling the office of [facility].    I understand that my insurance will be billed for this visit.   I have read or had this consent read to me.  I understand the contents of this consent, which adequately explains the benefits and risks of the Services being provided via telemedicine.   I have been provided ample opportunity to ask questions regarding this consent and the Services and have had my questions answered to my satisfaction.  I give my informed consent for the services to be provided through the use of telemedicine in my medical care

## 2020-02-11 NOTE — Patient Instructions (Signed)
Medication Instructions:  Your physician recommends that you continue on your current medications as directed. Please refer to the Current Medication list given to you today.  *If you need a refill on your cardiac medications before your next appointment, please call your pharmacy*   Lab Work: None ordered  If you have labs (blood work) drawn today and your tests are completely normal, you will receive your results only by: MyChart Message (if you have MyChart) OR A paper copy in the mail If you have any lab test that is abnormal or we need to change your treatment, we will call you to review the results.   Testing/Procedures: None ordered   Follow-Up: At [facility], you and your health needs are our priority.  As part of our continuing mission to provide you with exceptional heart care, we have created designated Provider Care Teams.  These Care Teams include your primary Cardiologist (physician) and Advanced Practice Providers (APPs -  Physician Assistants and Nurse Practitioners) who all work together to provide you with the care you need, when you need it.  We recommend signing up for the patient portal called "MyChart".  Sign up information is provided on this After Visit Summary.  MyChart is used to connect with patients for Virtual Visits (Telemedicine).  Patients are able to view lab/test results, encounter notes, upcoming appointments, etc.  Non-urgent messages can be sent to your provider as well.   To learn more about what you can do with MyChart, go to [email or web address].com.    Your next appointment:   10 month(s)  The format for your next appointment:   In Person  Provider:   Peter Nishan, MD   Other Instructions   

## 2020-02-11 NOTE — Patient Instructions (Signed)
Continue current medications  I am glad you are doing well! See you back in 1 year  

## 2020-02-11 NOTE — Progress Notes (Signed)
PATIENT: [name] [name] DOB: [birth year]  REASON FOR VISIT: follow up HISTORY FROM: patient  HISTORY OF PRESENT ILLNESS: Today 02/11/20  HISTORY  [name] [name] a 59 year old female, seen in request byher primary care physician Dr.Hoffman, Janett Billow Ratliff,for evaluation of migraine headache, initial evaluation was on June 25, 2018.  I have reviewed and summarized the referring note from the referring physician.She had a past medical history of hyperlipidemia, allergy.She has a history of pituitary adenoma, under close supervision of endocrinologist Dr. Aura Camps personally reviewed MRI of the brain with without contrast on March 2018, possible 3 mm microadenoma depressing the posterior floor of the sella, Repeat MRI of brain in January 24 2017, no significant change, MRA of the neck showed no large vessel disease. She also have a history of HIV, hepatitis C that was treated, Addison's disease, on high dose of hydrocortisone treatment, steroid induced diabetes,  She reported history of migraine headache when she was young, her typical migraine are bilateral temporal, retro-orbital area severe pressure headache with light noise sensitivity, nauseous, prefer to lie down in dark quiet room.  In October 2019, she began to have different kind of headaches, this started with dizziness, few things were spinning around, worsening with sudden positional movement, no hearing loss, no tinnitus, later she developed this constant bilateral temporal frontal area pressure headaches, difficulty focusing, once a week, it would exacerbated to a more severe headache with light noise sensitivity, nauseous, eating better lying down sleep.  Update Aug 27, 2018 SS: CMP, sed rate in March 2020 was unremarkable, MRI of the brain in March 2020, no change from 2018  She says that her last visit with Dr. Krista Blue, she was started on Topamax 50 mg twice a day.  She reports the Topamax has been very  beneficial for her headaches.  Since starting the Topamax, she is having about 2 headaches a week, but are not bad enough to take any medication for relief.  She has not taken the Imitrex.  She reports a cough, allergies.  She reports if she did not have allergies or cough, she thinks she would not have a headache.  She also takes Valium for chronic insomnia.  She reports overall she is doing much better with the addition of Topamax.  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  She denies any new problems or concerns.  09/24/2018 Dr. Krista Blue: She started Topamax 50mg  bid few months ago, which has helped her migraine, but it has affected her vision, she saw flash light in her eye, also complains of difficulty focusing, difficulty reading.    I was able to review her ophthalmology evaluation by [facility] dated Sep 14, 2018,  Anatomic narrow angle OU, glaucoma suspected, pituitary adenoma, dry eyes syndrome bilaterally, nuclear sclerosis OU, color age-related cataract OU, severe retinopathy OU, retinoschsis OU, vitreous obesity, macular Drusen OU, dermatochalasis,  Dr. Kathlen Mody think that she has side effect from topamax.  IOP 13/17, no glaucoma change OU  Topamax has helped her migraine, Imitrex 25 mg as needed was helpful to  Update February 11, 2020 SS: Migraines remain under excellent control, on nortriptyline 20 mg at bedtime, has not had to take Imitrex in over 6 months.  Visual disturbance from suspected narrow angle glaucoma resolved over time.  Denies new problems or concerns, is seeing orthopedic for some right elbow issues.  Is overall doing well, stress from COVID, affecting 2 of her brothers.  Is very pleased with headache control,  tolerating meds well.  REVIEW OF SYSTEMS: Out of a complete 14 system review of symptoms, the patient complains only of the following symptoms, and all other reviewed systems are negative.  Headache  ALLERGIES: Allergies  Allergen  Reactions  . Acetaminophen Other (See Comments)    Inflamed liver, hospitalized REACTION: Had liver problems, was hospitalized Inflamed liver, hospitalized  . Morphine Hives and Rash    REACTION: rash  . Triamterene Hives  . Dyazide [Hydrochlorothiazide W-Triamterene] Hives  . Lisinopril     Cough   . Topamax [Topiramate]   . Gabapentin Anxiety  . Losartan Rash    Pt had rash, worsening dizziness, and nausea after starting losartan, which improved after stopping losartan    HOME MEDICATIONS: Outpatient Medications Prior to Visit  Medication Sig Dispense Refill  . amLODipine (NORVASC) 10 MG tablet TAKE ONE TABLET BY MOUTH DAILY 90 tablet 1  . atorvastatin (LIPITOR) 10 MG tablet Take 0.5 tablets (5 mg total) by mouth daily. 90 tablet 1  . bictegravir-emtricitabine-tenofovir AF (BIKTARVY) 50-200-25 MG TABS tablet Take 1 tablet by mouth daily. 30 tablet 11  . budesonide (PULMICORT FLEXHALER) 180 MCG/ACT inhaler 2 inhalations 2 times per day. Rinse, gargle, and spit after use. 1 each 5  . buPROPion (WELLBUTRIN XL) 150 MG 24 hr tablet Take one tablet in the morning 180 tablet 3  . cetirizine (ZYRTEC) 10 MG tablet 1 tablet daily 90 tablet 6  . diclofenac Sodium (VOLTAREN) 1 % GEL Apply 4 g topically 4 (four) times daily. 50 g 2  . Elastic Bandages & Supports (WRIST/THUMB SPLINT/RIGHT MED) MISC Wear on R thumb/wrist. 1 each 0  . famotidine (PEPCID) 40 MG tablet TAKE ONE TABLET BY MOUTH AT BEDTIME 30 tablet 5  . gabapentin (NEURONTIN) 100 MG capsule Take 1 capsule (100 mg total) by mouth 2 (two) times daily. 60 capsule 5  . hydrocortisone (CORTEF) 10 MG tablet Take 10 mg by mouth as directed. Pt takes 10 mg in the am and 5 mg at 11 am and 3 pm    . liraglutide (VICTOZA) 18 MG/3ML SOPN Inject into the skin.    . metoprolol tartrate (LOPRESSOR) 25 MG tablet TAKE ONE TABLET BY MOUTH TWICE A DAY 60 tablet 0  . montelukast (SINGULAIR) 10 MG tablet TAKE ONE TABLET BY MOUTH AT BEDTIME 30 tablet 4    . nitroGLYCERIN (NITROSTAT) 0.4 MG SL tablet Place 1 tablet (0.4 mg total) under the tongue every 5 (five) minutes as needed for chest pain. 25 tablet 1  . pantoprazole (PROTONIX) 40 MG tablet TAKE ONE TABLET BY MOUTH DAILY 30 tablet 0  . PROAIR HFA 108 (90 Base) MCG/ACT inhaler INHALE TWO PUFFS BY MOUTH EVERY FOUR TO SIX HOURS AS NEEDED FOR COUGH OR WHEEZE 8.5 each 0  . promethazine (PHENERGAN) 25 MG tablet Take 1 tablet (25 mg total) by mouth every 6 (six) hours as needed for nausea or vomiting. 30 tablet 3  . sodium chloride (OCEAN) 0.65 % SOLN nasal spray PLACE 1 SPRAY INTO BOTH NOSTRILS AS NEEDED FOR CONGESTION. 3 Bottle 0  . triamcinolone (NASACORT) 55 MCG/ACT AERO nasal inhaler 1 spray each nostril 2 times per day 16.5 g 5  . Vitamin D, Cholecalciferol, 25 MCG (1000 UT) CAPS Take 1,000 mg by mouth daily. 90 capsule 2  . nortriptyline (PAMELOR) 10 MG capsule TAKE TWO CAPSULES BY MOUTH AT BEDTIME 60 capsule 3   No facility-administered medications prior to visit.    PAST MEDICAL HISTORY: Past Medical History:  Diagnosis Date  . Allergic rhinitis 05/09/2006  . Allergy   . Anxiety   . Arthritis   . Asthma   . CHF (congestive heart failure) ([place])   . Chronic back pain   . Diabetes mellitus without complication ([place])   . GERD (gastroesophageal reflux disease)   . Heart murmur    as a child  . 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 s/p 12 week course of simeprevir and sofosbuvir between October 2014 and January 2015 with resolution.  Marland Kitchen History of shingles   . HIV infection ([place])    1994  . Hyperlipidemia    no meds taken now  . Hypertension   . Migraine   . Pituitary microadenoma ([place]) 08/08/2014  . Prediabetes   . Refusal of blood transfusions as patient is Jehovah's Witness   . Secondary adrenal insufficiency ([place]) 06/29/2014  . Urticaria     PAST SURGICAL HISTORY: Past Surgical History:  Procedure Laterality Date  . BUNIONECTOMY      b/l  . COLECTOMY     2003 for diverticulitis, had colostomy bag and then reversed  . COLONOSCOPY    . HAND SURGERY    . NASAL SINUS SURGERY    . SHOULDER SURGERY     left  . TONSILLECTOMY      FAMILY HISTORY: Family History  Problem Relation Age of Onset  . Heart disease Mother   . Diabetes Mother   . Stroke Mother   . Heart disease Father   . Stroke Father   . Diabetes Father   . Hepatitis Sister        hcv  . Asthma Sister   . Allergic rhinitis Sister   . Stroke Other   . Colon polyps Brother   . Renal Disease Brother   . Cancer Sister        lung  . Asthma Sister   . Allergic rhinitis Sister   . Cancer Maternal Aunt   . Cancer Maternal Aunt   . Colon cancer Neg Hx   . Esophageal cancer Neg Hx   . Stomach cancer Neg Hx   . Rectal cancer Neg Hx   . Angioedema Neg Hx   . Eczema Neg Hx   . Urticaria Neg Hx     SOCIAL HISTORY: Social History   Socioeconomic History  . Marital status: Single    Spouse name: Not on file  . Number of children: 0  . Years of education: 81  . Highest education level: High school graduate  Occupational History  . Occupation: Disabled  Tobacco Use  . Smoking status: Former Smoker    Years: 20.00    Types: Cigarettes    Quit date: 04/26/2007    Years since quitting: 12.8  . Smokeless tobacco: Never Used  . Tobacco comment: QUIT 2009  Vaping Use  . Vaping Use: Never used  Substance and Sexual Activity  . Alcohol use: No    Alcohol/week: 0.0 standard drinks  . Drug use: No  . Sexual activity: Not Currently    Partners: Male    Birth control/protection: Condom    Comment: pt. given condoms  Other Topics Concern  . Not on file  Social History Narrative   Lives at home alone.   Right-handed.   Occasional tea or soda.   Te   Social Determinants of Health   Financial Resource Strain:   . Difficulty of Paying Living Expenses: Not on file  Food Insecurity:   . Worried About Charity fundraiser in the Last Year: Not on  file  . Ran Out of Food in the Last Year: Not on file  Transportation Needs:   . Lack of Transportation (Medical): Not on file  . Lack of Transportation (Non-Medical): Not on file  Physical Activity:   . Days of Exercise per Week: Not on file  . Minutes of Exercise per Session: Not on file  Stress:   . Feeling of Stress : Not on file  Social Connections:   . Frequency of Communication with Friends and Family: Not on file  . Frequency of Social Gatherings with Friends and Family: Not on file  . Attends Religious Services: Not on file  . Active Member of Clubs or Organizations: Not on file  . Attends Archivist Meetings: Not on file  . Marital Status: Not on file  Intimate Partner Violence:   . Fear of Current or Ex-Partner: Not on file  . Emotionally Abused: Not on file  . Physically Abused: Not on file  . Sexually Abused: Not on file   PHYSICAL EXAM  Vitals:   02/11/20 1106  BP: 122/74  Pulse: 79  Weight: 177 lb (80.3 kg)  Height: 5\' 1"  (1.549 m)   Body mass index is 33.44 kg/m.  Generalized: Well developed, in no acute distress   Neurological examination  Mentation: Alert oriented to time, place, history taking. Follows all commands speech and language fluent Cranial nerve II-XII: Pupils were equal round reactive to light. Extraocular movements were full, visual field were full on confrontational test. Facial sensation and strength were normal. Head turning and shoulder shrug  were normal and symmetric. Motor: The motor testing reveals 5 over 5 strength of all 4 extremities. Good symmetric motor tone is noted throughout.  Sensory: Sensory testing is intact to soft touch on all 4 extremities. No evidence of extinction is noted.  Coordination: Cerebellar testing reveals good finger-nose-finger and heel-to-shin bilaterally.  Gait and station: Gait is normal.  Reflexes: Deep tendon reflexes are symmetric   DIAGNOSTIC DATA (LABS, IMAGING, TESTING) - I reviewed  patient records, labs, notes, testing and imaging myself where available.  Lab Results  Component Value Date   WBC 8.8 11/13/2019   HGB 14.0 11/13/2019   HCT 43.3 11/13/2019   MCV 84.7 11/13/2019   PLT 253 11/13/2019      Component Value Date/Time   NA 140 11/13/2019 1351   NA 140 01/15/2015 1341   K 4.3 11/13/2019 1351   CL 103 11/13/2019 1351   CO2 26 11/13/2019 1351   GLUCOSE 69 11/13/2019 1351   BUN 13 11/13/2019 1351   BUN 11 01/15/2015 1341   CREATININE 0.92 11/13/2019 1351   CALCIUM 9.7 11/13/2019 1351   PROT 7.3 11/13/2019 1351   ALBUMIN 4.1 07/12/2016 1137   AST 16 11/13/2019 1351   ALT 18 11/13/2019 1351   ALKPHOS 87 07/12/2016 1137   BILITOT 0.4 11/13/2019 1351   GFRNONAA 89 11/24/2015 1510   GFRAA >89 11/24/2015 1510   Lab Results  Component Value Date   CHOL 159 11/13/2019   HDL 40 (L) 11/13/2019   LDLCALC 81 11/13/2019   TRIG 286 (H) 11/13/2019   CHOLHDL 4.0 11/13/2019   Lab Results  Component Value Date   HGBA1C 6.3 (A) 11/22/2019   No results found for: [id number] Lab Results  Component Value Date   TSH 0.695 06/26/2014      ASSESSMENT AND PLAN 59  y.o. year old female  has a past medical history of Allergic rhinitis (05/09/2006), Allergy, Anxiety, Arthritis, Asthma, CHF (congestive heart failure) ([place]), Chronic back pain, Diabetes mellitus without complication ([place]), GERD (gastroesophageal reflux disease), Heart murmur, Hepatitis C, History of shingles, HIV infection ([place]), Hyperlipidemia, Hypertension, Migraine, Pituitary microadenoma ([place]) (08/08/2014), Prediabetes, Refusal of blood transfusions as patient is Jehovah's Witness, Secondary adrenal insufficiency ([place]) (06/29/2014), and Urticaria. here with:  1.  Chronic daily headaches 2.  Pituitary microadenoma -Headaches well controlled -Could not tolerate Topamax due to side effect to her eyes -Continue nortriptyline 20 mg at bedtime -Continue Imitrex 25 mg as needed for acute headache -MRI of  the brain in March 2020 showed overall stable pituitary microadenoma, no change from 2018, will follow over time -Follow-up in 1 year or sooner if needed  I spent 20 minutes of face-to-face and non-face-to-face time with patient.  This included previsit chart review, lab review, study review, order entry, electronic health record documentation, patient education.  Butler Denmark, AGNP-C, DNP 02/11/2020, 11:37 AM [facility] [address] [place], [place] [address] [phone]

## 2020-02-20 ENCOUNTER — Other Ambulatory Visit: Payer: Self-pay

## 2020-02-20 ENCOUNTER — Encounter: Payer: Self-pay | Admitting: Orthopedic Surgery

## 2020-02-20 ENCOUNTER — Ambulatory Visit (INDEPENDENT_AMBULATORY_CARE_PROVIDER_SITE_OTHER): Payer: Medicaid Other

## 2020-02-20 ENCOUNTER — Ambulatory Visit (INDEPENDENT_AMBULATORY_CARE_PROVIDER_SITE_OTHER): Payer: Medicaid Other | Admitting: Orthopedic Surgery

## 2020-02-20 VITALS — Ht 61.0 in | Wt 177.0 lb

## 2020-02-20 DIAGNOSIS — M25521 Pain in right elbow: Secondary | ICD-10-CM

## 2020-02-20 NOTE — Progress Notes (Signed)
Internal Medicine Clinic Attending  I saw and evaluated the patient.  I personally confirmed the key portions of the history and exam documented by Dr. Aslam and I reviewed pertinent patient test results.  The assessment, diagnosis, and plan were formulated together and I agree with the documentation in the resident's note.     

## 2020-02-22 ENCOUNTER — Encounter: Payer: Self-pay | Admitting: Orthopedic Surgery

## 2020-02-22 NOTE — Progress Notes (Signed)
Office Visit Note   Patient: [name] [name]           Date of Birth: 01-26-1961           MRN: [id number] Visit Date: 02/20/2020 Requested by: [name], MD [place],  [place] [id number] PCP: [name], MD  Subjective: Chief Complaint  Patient presents with  . Right Elbow - Pain    HPI: [name] [name] is a 59 year old patient with right elbow pain.  Been going on for 2 and half weeks.  She reports some elbow catching.  This amount of symptom severity does not reach the level that she would consider operative intervention if in fact she had a loose body in her elbow.  Denies any swelling.  Tried Voltaren gel but did not help.  Pain is worse at night and with use.  Denies any radicular symptoms.  She is right-hand dominant.              ROS: All systems reviewed are negative as they relate to the chief complaint within the history of present illness.  Patient denies  fevers or chills.   Assessment & Plan: Visit Diagnoses:  1. Pain in right elbow     Plan: Impression is right elbow pain..  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  No loose bodies visible on plain radiographs.  We talked about injection into the elbow joint today as well as CT versus MRI scanning to identify loose body but she states that her symptoms are that bad enough yet to warrant that type of intervention.  She will follow-up as needed. Follow-Up Instructions: Return if symptoms worsen or fail to improve.   Orders:  Orders Placed This Encounter  Procedures  . XR Elbow 2 Views Right   No orders of the defined types were placed in this encounter.     Procedures: No procedures performed   Clinical Data: No additional findings.  Objective: Vital Signs: Ht 5\' 1"  (1.549 m)   Wt 177 lb (80.3 kg)   BMI 33.44 kg/m   Physical Exam:   Constitutional: Patient appears well-developed HEENT:  Head: Normocephalic Eyes:EOM are  normal Neck: Normal range of motion Cardiovascular: Normal rate Pulmonary/chest: Effort normal Neurologic: Patient is alert Skin: Skin is warm Psychiatric: Patient has normal mood and affect    Ortho Exam: Ortho exam demonstrates full active and passive range of motion of the right elbow.  Biceps triceps function intact.  No subluxation of the ulnar nerve.  Motor sensory function in the hand is intact.  Radial pulses intact.  No masses lymphadenopathy or skin changes noted in that right elbow region.  Specialty Comments:  No specialty comments available.  Imaging: No results found.   PMFS History: Patient Active Problem List   Diagnosis Date Noted  . Arm pain 02/10/2020  . Dizziness on standing 12/27/2019  . Generalized anxiety disorder with panic attacks 06/20/2019  . 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 Chronic migraine w/o aura w/o status migrainosus, not intractable 08/27/2018  . Disorder of left eustachian tube 05/19/2017  . Vertigo of central origin 01/23/2017  . 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 BPPV (benign paroxysmal positional vertigo) 10/02/2015  . Liver fibrosis ([place]) 12/04/2014  . Pituitary microadenoma ([place]) 08/08/2014  . 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 Vitamin D deficiency 06/29/2014  . Secondary adrenal insufficiency ([place]) 06/29/2014  . History  of tympanostomy tube placement 02/07/2014  . Refusal of blood transfusions as patient is Jehovah's Witness 11/18/2013  . Hepatitis C virus infection cured after antiviral drug therapy 03/16/2012  . Health care maintenance 12/15/2011  . Opioid dependence ([place]) 10/05/2010  . HTN (hypertension) 10/05/2010  . Hyperlipidemia associated with type 2 diabetes mellitus ([place]) 10/23/2008  . HIV disease ([place]) 05/09/2006  . Depression 05/09/2006  . Allergic rhinitis 05/09/2006  . GERD 05/09/2006   Past Medical History:  Diagnosis Date  . Allergic  rhinitis 05/09/2006  . Allergy   . Anxiety   . Arthritis   . Asthma   . CHF (congestive heart failure) ([place])   . Chronic back pain   . Diabetes mellitus without complication ([place])   . GERD (gastroesophageal reflux disease)   . Heart murmur    as a child  . 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 s/p 12 week course of simeprevir and sofosbuvir between October 2014 and January 2015 with resolution.  Marland Kitchen History of shingles   . HIV infection ([place])    1994  . Hyperlipidemia    no meds taken now  . Hypertension   . Migraine   . Pituitary microadenoma ([place]) 08/08/2014  . Prediabetes   . Refusal of blood transfusions as patient is Jehovah's Witness   . Secondary adrenal insufficiency ([place]) 06/29/2014  . Urticaria     Family History  Problem Relation Age of Onset  . Heart disease Mother   . Diabetes Mother   . Stroke Mother   . Heart disease Father   . Stroke Father   . Diabetes Father   . Hepatitis Sister        hcv  . Asthma Sister   . Allergic rhinitis Sister   . Stroke Other   . Colon polyps Brother   . Renal Disease Brother   . Cancer Sister        lung  . Asthma Sister   . Allergic rhinitis Sister   . Cancer Maternal Aunt   . Cancer Maternal Aunt   . Colon cancer Neg Hx   . Esophageal cancer Neg Hx   . Stomach cancer Neg Hx   . Rectal cancer Neg Hx   . Angioedema Neg Hx   . Eczema Neg Hx   . Urticaria Neg Hx     Past Surgical History:  Procedure Laterality Date  . BUNIONECTOMY     b/l  . COLECTOMY     2003 for diverticulitis, had colostomy bag and then reversed  . COLONOSCOPY    . HAND SURGERY    . NASAL SINUS SURGERY    . SHOULDER SURGERY     left  . TONSILLECTOMY     Social History   Occupational History  . Occupation: Disabled  Tobacco Use  . Smoking status: Former Smoker    Years: 20.00    Types: Cigarettes    Quit date: 04/26/2007    Years since quitting: 12.8  . Smokeless tobacco: Never Used  . Tobacco  comment: QUIT 2009  Vaping Use  . Vaping Use: Never used  Substance and Sexual Activity  . Alcohol use: No    Alcohol/week: 0.0 standard drinks  . Drug use: No  . Sexual activity: Not Currently    Partners: Male    Birth control/protection: Condom    Comment: pt. given condoms

## 2020-02-25 NOTE — Progress Notes (Signed)
I have reviewed and agreed above plan. 

## 2020-03-06 DIAGNOSIS — H906 Mixed conductive and sensorineural hearing loss, bilateral: Secondary | ICD-10-CM | POA: Diagnosis not present

## 2020-03-06 DIAGNOSIS — H7202 Central perforation of tympanic membrane, left ear: Secondary | ICD-10-CM | POA: Diagnosis not present

## 2020-03-06 DIAGNOSIS — H7291 Unspecified perforation of tympanic membrane, right ear: Secondary | ICD-10-CM | POA: Diagnosis not present

## 2020-03-06 DIAGNOSIS — H6983 Other specified disorders of Eustachian tube, bilateral: Secondary | ICD-10-CM | POA: Diagnosis not present

## 2020-03-06 DIAGNOSIS — H66005 Acute suppurative otitis media without spontaneous rupture of ear drum, recurrent, left ear: Secondary | ICD-10-CM | POA: Diagnosis not present

## 2020-03-09 ENCOUNTER — Other Ambulatory Visit: Payer: Self-pay | Admitting: Internal Medicine

## 2020-03-16 ENCOUNTER — Encounter: Payer: Self-pay | Admitting: Physician Assistant

## 2020-03-16 ENCOUNTER — Other Ambulatory Visit: Payer: Self-pay

## 2020-03-16 ENCOUNTER — Ambulatory Visit (INDEPENDENT_AMBULATORY_CARE_PROVIDER_SITE_OTHER): Payer: Medicaid Other

## 2020-03-16 ENCOUNTER — Ambulatory Visit (INDEPENDENT_AMBULATORY_CARE_PROVIDER_SITE_OTHER): Payer: Medicaid Other | Admitting: Physician Assistant

## 2020-03-16 ENCOUNTER — Other Ambulatory Visit: Payer: Self-pay | Admitting: Internal Medicine

## 2020-03-16 ENCOUNTER — Ambulatory Visit: Payer: Medicaid Other

## 2020-03-16 DIAGNOSIS — M25522 Pain in left elbow: Secondary | ICD-10-CM | POA: Diagnosis not present

## 2020-03-16 MED ORDER — TRAMADOL HCL 50 MG PO TABS: 50.0000 mg | ORAL_TABLET | Freq: Four times a day (QID) | ORAL | 0 refills | Status: DC | PRN

## 2020-03-16 NOTE — Telephone Encounter (Signed)
Need refill on metoprolol tartrate (LOPRESSOR) 25 MG tablet ;pt contact [place], [place]
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
## 2020-03-16 NOTE — Telephone Encounter (Signed)
RX refill request submitted via pharmacy, forwarded to MD today. SChaplin, RN,BSN

## 2020-04-02 ENCOUNTER — Other Ambulatory Visit: Payer: Self-pay | Admitting: Internal Medicine

## 2020-04-02 NOTE — Telephone Encounter (Signed)
  pantoprazole (PROTONIX) 40 MG tablet, refill request @  [facility], [place] Phone:  [phone]  Fax:  [phone]

## 2020-04-03 ENCOUNTER — Other Ambulatory Visit: Payer: Self-pay | Admitting: Internal Medicine

## 2020-04-03 DIAGNOSIS — H7291 Unspecified perforation of tympanic membrane, right ear: Secondary | ICD-10-CM | POA: Diagnosis not present

## 2020-04-03 DIAGNOSIS — H6983 Other specified disorders of Eustachian tube, bilateral: Secondary | ICD-10-CM | POA: Diagnosis not present

## 2020-04-03 DIAGNOSIS — K219 Gastro-esophageal reflux disease without esophagitis: Secondary | ICD-10-CM

## 2020-04-03 DIAGNOSIS — H7202 Central perforation of tympanic membrane, left ear: Secondary | ICD-10-CM | POA: Diagnosis not present

## 2020-04-03 DIAGNOSIS — H906 Mixed conductive and sensorineural hearing loss, bilateral: Secondary | ICD-10-CM | POA: Diagnosis not present

## 2020-04-03 MED ORDER — PANTOPRAZOLE SODIUM 40 MG PO TBEC: 40.0000 mg | DELAYED_RELEASE_TABLET | Freq: Two times a day (BID) | ORAL | 2 refills | Status: DC

## 2020-05-01 ENCOUNTER — Encounter: Payer: Self-pay | Admitting: Physical Medicine & Rehabilitation

## 2020-05-01 ENCOUNTER — Other Ambulatory Visit: Payer: Self-pay | Admitting: Internal Medicine

## 2020-05-01 ENCOUNTER — Encounter: Payer: Medicaid Other | Attending: Physical Medicine & Rehabilitation | Admitting: Physical Medicine & Rehabilitation

## 2020-05-01 ENCOUNTER — Other Ambulatory Visit: Payer: Self-pay

## 2020-05-01 VITALS — BP 144/82 | HR 86 | Temp 97.7°F | Ht 61.0 in | Wt 176.4 lb

## 2020-05-01 DIAGNOSIS — Z1231 Encounter for screening mammogram for malignant neoplasm of breast: Secondary | ICD-10-CM

## 2020-05-01 DIAGNOSIS — M461 Sacroiliitis, not elsewhere classified: Secondary | ICD-10-CM | POA: Diagnosis not present

## 2020-05-01 NOTE — Patient Instructions (Signed)
Sacroiliac injection was performed today. A combination of a naming medicine plus a cortisone medicine was injected. The injection was done under x-ray guidance. This procedure has been performed to help reduce low back and buttocks pain as well as potentially hip pain. The duration of this injection is variable lasting from hours to  Months. It may repeated if needed. 

## 2020-05-01 NOTE — Progress Notes (Signed)
60 year old female with chronic bilateral sacroiliac pain.  She has tried conservative care including physical therapy and prescription medications.  Medial branch blocks were performed left L3-L4 as well as L5 dorsal ramus on 07/11/2017 only relieved pain by about 12%.  Her pain is mainly below the waist in the upper buttock region.  Her exam is consistent with sacroiliac pain. Her last sacroiliac injection was approximately 3 months ago. patient gets approximately 50% relief with her sacroiliac injections.  She gets approximately 2 to 3 months of pain relief after each injection on a consistent basis.  Has a history of HIV with CD4 count of 768, well above threshold of 400.   Indication: Bilateral Low back and buttocks pain not relieved by medication management and other conservative care.  Informed consent was obtained after describing risks and benefits of the procedure with the patient, this includes bleeding, bruising, infection, paralysis and medication side effects. The patient wishes to proceed and has given written consent. The patient was placed in a prone position. The lumbar and sacral area was marked and prepped with Betadine. A 25-gauge 1-1/2 inch needle was inserted into the skin and subcutaneous tissue and 1 mL of 1% lidocaine was injected. Then a 25-gauge 3 inch spinal needle was inserted under fluoroscopic guidance into the left sacroiliac joint. AP and lateral images were utilized. Isovue 200x0.5 mL under live fluoroscopy demonstrated no intravascular uptake. Then a solution containing one ML of 6 mg per mLbetamethasone and 2 ML of 2% lidocaine MPF was injected x1.5 mL.Same procedure performed on the right with same technique and equipment.  Patient tolerated the procedure well. Post procedure instructions were given. Please see post procedure form.

## 2020-05-01 NOTE — Progress Notes (Signed)
  PROCEDURE RECORD [facility]   Name: [name] [name] DOB:1961/01/28 MRN: [id number]  Date:05/01/2020  Physician: Alysia Penna, MD    Nurse/CMA: Jorja Loa MA  Allergies:  Allergies  Allergen Reactions  . Acetaminophen Other (See Comments)    Inflamed liver, hospitalized REACTION: Had liver problems, was hospitalized Inflamed liver, hospitalized Other reaction(s): liver demage Other reaction(s): liver demage  . Morphine Hives and Rash    REACTION: rash  . Triamterene Hives  . Aspirin-Caffeine     Other reaction(s): liver demage  . Celexa [Citalopram]     Other reaction(s): INCREASED ANXIETY  . Dyazide [Hydrochlorothiazide W-Triamterene] Hives  . Empagliflozin     Other reaction(s): stomach ache, diarrhea  . Emtricitabine-Tenofovir Df     Other reaction(s): rash  . Lisinopril     Cough   . Metformin Hcl Er     Other reaction(s): upset stomach  . Morphine Sulfate     Other reaction(s): shortness of breath and hives  . Topamax [Topiramate]   . Triamterene-Hctz     Other reaction(s): rash  . Losartan Rash    Pt had rash, worsening dizziness, and nausea after starting losartan, which improved after stopping losartan    Consent Signed: Yes.    Is patient diabetic? Yes.    CBG today? 141  Pregnant: No. LMP: No LMP recorded. Patient is postmenopausal. (age 62-55)  Anticoagulants: no Anti-inflammatory: no Antibiotics: no  Procedure: Bilateral Sacroiliac Injection Position: Prone Start Time: 11:08am  End Time: 11:15am Fluoro Time: 19.7  RN/CMA Gwyndolyn Saxon MA    Time 10:56 am 11:15am    BP 144/82 145/88    Pulse 84 87    Respirations 16 16    O2 Sat 98 97    S/S 12 12    Pain Level 10/10 8/10     D/C home with  [facility], [name] & O X 3, D/C instructions reviewed, and sits independently.

## 2020-05-04 ENCOUNTER — Ambulatory Visit: Payer: Medicaid Other

## 2020-05-07 ENCOUNTER — Other Ambulatory Visit: Payer: Self-pay

## 2020-05-07 ENCOUNTER — Other Ambulatory Visit: Payer: Self-pay | Admitting: Obstetrics and Gynecology

## 2020-05-07 NOTE — Patient Instructions (Signed)
Hi Ms. Halliwell, thank you for speaking with me today.  Ms. Swigert was given information about Medicaid Managed Care team care coordination services as a part of their [place] Medicaid benefit. [name] verbally consented to engagement with the [facility] Managed Care team.   For questions related to your [facility], please call: [phone] or visit the homepage here: [email or web address]  If you would like to schedule transportation through your [facility], please call the following number at least 2 days in advance of your appointment: [phone]  Ms. Rupert - following are the goals we discussed in your visit today:  Goals Addressed            This Visit's Progress   . Protect My Health       Timeframe:  Long-Range Goal Priority:  Medium Start Date:      05/07/20                       Expected End Date:      08/05/20                 Follow Up Date 06/07/20   - schedule appointment for vaccines needed due to my age or health - schedule recommended health tests (blood work, mammogram, colonoscopy, pap test) - schedule and keep appointment for annual check-up    Why is this important?    Screening tests can find diseases early when they are easier to treat.   Your doctor or nurse will talk with you about which tests are important for you.   Getting shots for common diseases like the flu and shingles will help prevent them.         Patient verbalizes understanding of instructions provided today.   The Managed Medicaid care management team will reach out to the patient again over the next 30 days.  The patient has been provided with contact information for the Managed Medicaid care management team and has been advised to call with any health related questions or concerns.   Aida Raider RN, BSN [place]  [facility], hospital - Managed Medicaid High Risk [phone].  Following is a copy of your plan of care:      Problem Identified: Health Promotion or Disease Self-Management (General Plan of Care)   Priority: Medium  Onset Date: 05/07/2020    Patient Care Plan: General Plan of Care (Adult)    Problem Identified: Health Promotion or Disease Self-Management (General Plan of Care)     Goal: Self-Management Plan Developed   Note:   Current Barriers:  . Chronic Disease Management support and education needs. . Patient helping to provide care to brother.  Nurse Case Manager Clinical Goal(s):  Marland Kitchen Over the next 30 days, patient will attend all scheduled medical appointments. . Over the next 30 days, patient will work with CM team pharmacist to review medications.  Interventions:  . Inter-disciplinary care team collaboration (see longitudinal plan of care) . Evaluation of current treatment plan and patient's adherence to plan as established by provider. . Reviewed medications with patient. Nash Dimmer with pharmacy regarding medications. . Discussed plans with patient for ongoing care management follow up and provided patient with direct contact information for care management team . Reviewed scheduled/upcoming provider appointments. . Pharmacy referral for medication review.  Patient Goals/Self-Care Activities Over the next 30 days, patient will:  -Attends all scheduled provider appointments Calls  pharmacy for medication refills Calls provider office for new concerns or questions  Follow Up Plan: The Managed Medicaid care management team will reach out to the patient again over the next 30 days.  The patient has been provided with contact information for the Managed Medicaid care management team and has been advised to call with any health related questions or concerns.

## 2020-05-07 NOTE — Patient Outreach (Signed)
Medicaid Managed Care   Nurse Care Manager Note  05/07/2020 Name:  [name] [name] MRN:  [id number] DOB:  [birth year]  [name] [name] is an 60 y.o. year old female who is a primary patient of [name], MD.  The [facility] Managed Care Coordination team was consulted for assistance with:    chronic healthcare management needs.  Ms. Ramdial was given information about Medicaid Managed Care Coordination team services today. [name] [name] agreed to services and verbal consent obtained.  Engaged with patient by telephone for initial visit in response to provider referral for case management and/or care coordination services.   Assessments/Interventions:  Review of past medical history, allergies, medications, health status, including review of consultants reports, laboratory and other test data, was performed as part of comprehensive evaluation and provision of chronic care management services.  SDOH (Social Determinants of Health) assessments and interventions performed:   Care Plan  Allergies  Allergen Reactions  . Acetaminophen Other (See Comments)    Inflamed liver, hospitalized REACTION: Had liver problems, was hospitalized Inflamed liver, hospitalized Other reaction(s): liver demage Other reaction(s): liver demage  . Morphine Hives and Rash    REACTION: rash  . Triamterene Hives  . Aspirin-Caffeine     Other reaction(s): liver demage  . Celexa [Citalopram]     Other reaction(s): INCREASED ANXIETY  . Dyazide [Hydrochlorothiazide W-Triamterene] Hives  . Empagliflozin     Other reaction(s): stomach ache, diarrhea  . Emtricitabine-Tenofovir Df     Other reaction(s): rash  . Lisinopril     Cough   . Metformin Hcl Er     Other reaction(s): upset stomach  . Morphine Sulfate     Other reaction(s): shortness of breath and hives  . Topamax [Topiramate]   . Triamterene-Hctz     Other reaction(s): rash  . Losartan Rash    Pt had rash, worsening dizziness, and nausea after  starting losartan, which improved after stopping losartan    Medications Reviewed Today    Reviewed by Gayla Medicus, RN (Registered Nurse) on 05/07/20 at 1332  Med List Status: <None>  Medication Order Taking? Sig Documenting Provider Last Dose Status Informant  amLODipine (NORVASC) 10 MG tablet [id number] Yes TAKE ONE TABLET BY MOUTH DAILY [name], MD Taking Active   atorvastatin (LIPITOR) 10 MG tablet [id number] Yes Take 0.5 tablets (5 mg total) by mouth daily. Ladona Horns, MD Taking Active   bictegravir-emtricitabine-tenofovir AF (BIKTARVY) 50-200-25 MG TABS tablet [id number] Yes Take 1 tablet by mouth daily. [place] Callas, NP Taking Active   budesonide [facility]) 180 MCG/ACT inhaler [id number] Yes 2 inhalations 2 times per day. Rinse, gargle, and spit after use. Kozlow, Donnamarie Poag, MD Taking Active   buPROPion (WELLBUTRIN XL) 150 MG 24 hr tablet [id number] Yes Take one tablet in the morning [name], MD Taking Active   cetirizine (ZYRTEC) 10 MG tablet [id number] Yes 1 tablet daily Kozlow, Donnamarie Poag, MD Taking Active   cholecalciferol (VITAMIN D) 25 MCG (1000 UNIT) tablet [id number] Yes TAKE ONE TABLET BY MOUTH DAILY Katsadouros, Vasilios, MD Taking Active   diclofenac Sodium (VOLTAREN) 1 % GEL [id number] Yes Apply 4 g topically 4 (four) times daily. Harvie Heck, MD Taking Active   Elastic Bandages & Supports (WRIST/THUMB SPLINT/RIGHT MED) MISC [id number] Yes Wear on R thumb/wrist. Ladona Horns, MD Taking Active   famotidine (PEPCID) 40 MG tablet [id number] Yes TAKE ONE TABLET BY MOUTH AT BEDTIME Kozlow, Donnamarie Poag, MD Taking Active   gabapentin (NEURONTIN) 100 MG capsule  [id number] Yes Take 1 capsule (100 mg total) by mouth 2 (two) times daily. Kozlow, Donnamarie Poag, MD Taking Active   hydrocortisone (CORTEF) 10 MG tablet [id number] Yes Take 10 mg by mouth as directed. Pt takes 10 mg in the am and 5 mg at 11 am and 3 pm [provider] Taking Active   liraglutide (VICTOZA) 18  MG/3ML SOPN [id number] Yes Inject into the skin. [provider] Taking Active   metoprolol tartrate (LOPRESSOR) 25 MG tablet [id number] Yes TAKE ONE TABLET BY MOUTH TWICE A Worthy Keeler, MD Taking Active   montelukast (SINGULAIR) 10 MG tablet [id number] Yes TAKE ONE TABLET BY MOUTH AT BEDTIME Kozlow, Donnamarie Poag, MD Taking Active   nitroGLYCERIN (NITROSTAT) 0.4 MG SL tablet [id number] Yes Place 1 tablet (0.4 mg total) under the tongue every 5 (five) minutes as needed for chest pain. Isaiah Serge, NP Taking Active   nortriptyline (PAMELOR) 10 MG capsule [id number] Yes Take 2 capsules (20 mg total) by mouth at bedtime. Suzzanne Cloud, NP Taking Active   pantoprazole (PROTONIX) 40 MG tablet [id number] Yes Take 1 tablet (40 mg total) by mouth 2 (two) times daily. Jean Rosenthal, MD Taking Active   PROAIR HFA 108 [phone] Base) MCG/ACT inhaler [id number] Yes INHALE TWO PUFFS BY MOUTH EVERY FOUR TO SIX HOURS AS NEEDED FOR COUGH OR WHEEZE Kozlow, Donnamarie Poag, MD Taking Active   promethazine (PHENERGAN) 25 MG tablet [id number] Yes Take 1 tablet (25 mg total) by mouth every 6 (six) hours as needed for nausea or vomiting. [name], DO Taking Active   sodium chloride (OCEAN) 0.65 % SOLN nasal spray [id number] Yes PLACE 1 SPRAY INTO BOTH NOSTRILS AS NEEDED FOR CONGESTION. [name], DO Taking Active   traMADol (ULTRAM) 50 MG tablet [id number] Yes Take 1 tablet (50 mg total) by mouth every 6 (six) hours as needed. Pete Pelt, PA-C Taking Active   triamcinolone (NASACORT) 55 MCG/ACT AERO nasal inhaler [id number] Yes 1 spray each nostril 2 times per day Jiles Prows, MD Taking Active           Patient Active Problem List   Diagnosis Date Noted  . Arm pain 02/10/2020  . Dizziness on standing 12/27/2019  . Generalized anxiety disorder with panic attacks 06/20/2019  . 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 Chronic migraine w/o aura w/o status migrainosus, not intractable  08/27/2018  . Disorder of left eustachian tube 05/19/2017  . Vertigo of central origin 01/23/2017  . Central perforation of tympanic membrane of left ear 12/09/2016  . Eustachian tube dysfunction, bilateral 06/15/2016  . Ear pain, right 06/15/2016  . 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 BPPV (benign paroxysmal positional vertigo) 10/02/2015  . Liver fibrosis ([place]) 12/04/2014  . Pituitary microadenoma ([place]) 08/08/2014  . 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 Vitamin D deficiency 06/29/2014  . Secondary adrenal insufficiency ([place]) 06/29/2014  . History of tympanostomy tube placement 02/07/2014  . Refusal of blood transfusions as patient is Jehovah's Witness 11/18/2013  . Hepatitis C virus infection cured after antiviral drug therapy 03/16/2012  . Health care maintenance 12/15/2011  . Opioid dependence ([place]) 10/05/2010  . HTN (hypertension) 10/05/2010  . Hyperlipidemia associated with type 2 diabetes mellitus ([place]) 10/23/2008  . HIV disease ([place]) 05/09/2006  . Depression 05/09/2006  . Allergic rhinitis 05/09/2006  . GERD 05/09/2006    Conditions to be addressed/monitored per PCP order:  as stated above.  Problem: Health Promotion or Disease Self-Management (General Plan of Care)   Priority: Medium  Onset Date: 05/07/2020    Care Plan : General Plan of Care (Adult)  Updates made by Gayla Medicus, RN since 05/07/2020 12:00 AM    Problem: Health Promotion or Disease Self-Management (General Plan of Care)     Goal: Self-Management Plan Developed   Note:   Current Barriers:  . Chronic Disease Management support and education needs. . Patient helping to provide care to brother.  Nurse Case Manager Clinical Goal(s):  Marland Kitchen Over the next 30 days, patient will attend all scheduled medical appointments. . Over the next 30 days, patient will work with CM team pharmacist to review medications.  Interventions:   . Inter-disciplinary care team collaboration (see longitudinal plan of care) . Evaluation of current treatment plan and patient's adherence to plan as established by provider. . Reviewed medications with patient. Nash Dimmer with pharmacy regarding medications. . Discussed plans with patient for ongoing care management follow up and provided patient with direct contact information for care management team . Reviewed scheduled/upcoming provider appointments. . Pharmacy referral for medication review.  Patient Goals/Self-Care Activities Over the next 30 days, patient will:  -Attends all scheduled provider appointments Calls pharmacy for medication refills Calls provider office for new concerns or questions  Follow Up Plan: The Managed Medicaid care management team will reach out to the patient again over the next 30 days.  The patient has been provided with contact information for the Managed Medicaid care management team and has been advised to call with any health related questions or concerns.            Follow Up:  Patient agrees to Care Plan and Follow-up.  Plan: The Managed Medicaid care management team will reach out to the patient again over the next 30 days. and The patient has been provided with contact information for the Managed Medicaid care management team and has been advised to call with any health related questions or concerns.  Date/time of next scheduled RN care management/care coordination outreach:  06/02/20 at 1030

## 2020-05-11 ENCOUNTER — Other Ambulatory Visit: Payer: Self-pay

## 2020-05-11 ENCOUNTER — Other Ambulatory Visit: Payer: Self-pay | Admitting: Internal Medicine

## 2020-05-11 NOTE — Patient Instructions (Signed)
Visit Information  Ms. Lussier was given information about Medicaid Managed Care team care coordination services as a part of their [place] Medicaid benefit. [name] verbally consented to engagement with the [facility] Managed Care team.   For questions related to your [facility], please call: [phone] or visit the homepage here: [email or web address]  If you would like to schedule transportation through your [facility], please call the following number at least 2 days in advance of your appointment: [phone]  Ms. Delany - following are the goals we discussed in your visit today:  Goals Addressed            This Visit's Progress   . Manage My Medicine       Timeframe:  Short-Term Goal Priority:  High Start Date:                Today             Expected End Date:        Today               Follow Up Date 90 days   - keep a list of all the medicines I take; vitamins and herbals too  -Patient will try Bupropion at night to decrease ADR's   Why is this important?   . These steps will help you keep on track with your medicines.   Notes:        Please see education materials related to DM provided as print materials.   Patient verbalizes understanding of instructions provided today.   The patient has been provided with contact information for the Managed Medicaid care management team and has been advised to call with any health related questions or concerns.   Lane Hacker, [facility]  Following is a copy of your plan of care:  Patient Care Plan: General Plan of Care (Adult)    Problem Identified: Health Promotion or Disease Self-Management (General Plan of Care)   Priority: Medium  Onset Date: 05/07/2020    Patient Care Plan: General Plan of Care (Adult)    Problem Identified: Health Promotion or Disease Self-Management (General Plan of  Care)     Goal: Self-Management Plan Developed   Note:   Current Barriers:  . Chronic Disease Management support and education needs. . Patient helping to provide care to brother.  Nurse Case Manager Clinical Goal(s):  Marland Kitchen Over the next 30 days, patient will attend all scheduled medical appointments. . Over the next 30 days, patient will work with CM team pharmacist to review medications.  Interventions:  . Inter-disciplinary care team collaboration (see longitudinal plan of care) . Evaluation of current treatment plan and patient's adherence to plan as established by provider. . Reviewed medications with patient. Nash Dimmer with pharmacy regarding medications. . Discussed plans with patient for ongoing care management follow up and provided patient with direct contact information for care management team . Reviewed scheduled/upcoming provider appointments. . Pharmacy referral for medication review.  Patient Goals/Self-Care Activities Over the next 30 days, patient will:  -Attends all scheduled provider appointments Calls pharmacy for medication refills Calls provider office for new concerns or questions  Follow Up Plan: The Managed Medicaid care management team will reach out to the patient again over the next 30 days.  The patient has been provided with contact information for the Managed Medicaid care management team and has been advised to call with any health related questions  or concerns.      Task: Mutually Develop and Royce Macadamia Achievement of Patient Goals   Note:   Care Management Activities:    - verbalization of feelings encouraged    Notes:    Patient Care Plan: Medication Management    Problem Identified: Health Promotion or Disease Self-Management (General Plan of Care)     Goal: Self-Management Plan Developed   Note:   Current Barriers:  . Unable to self administer medications as prescribed . Unable to explain what BP, Hyperlipidemia, and other disease  states do to the body . Itching at night . ADR's from steroid TID  Pharmacist Clinical Goal(s):  Marland Kitchen Over the next 90 days, patient will contact provider office for questions/concerns as evidenced notation of same in electronic health record through collaboration with PharmD and provider.  .   Interventions: . Inter-disciplinary care team collaboration (see longitudinal plan of care) . Comprehensive medication review performed; medication list updated in electronic medical record . Patient will let specialist know, has appt soon, about how she isn't taking steroid TID (As directed) due to ADR's (Weight gain). I believe this is why she's itching at night (She's itching daily but the AM and noon doses are hiding it)  @RXCPDIABETES @ @RXCPHYPERTENSION @ @RXCPHYPERLIPIDEMIA @ @RXCPMENTALHEALTH @  Patient Goals/Self-Care Activities . Over the next 90 days, patient will:  - take medications as prescribed  Follow Up Plan: The care management team will reach out to the patient again over the next 90 days.     Task: Mutually Develop and Royce Macadamia Achievement of Patient Goals   Note:   Care Management Activities:    - verbalization of feelings encouraged    Notes:

## 2020-05-11 NOTE — Patient Outreach (Signed)
Medicaid Managed Care    Pharmacy Note  05/11/2020 Name: [name] [name] MRN: [id number] DOB: September 29, 1960  [name] [name] is a 60 y.o. year old female who is a primary care patient of [name], MD. The [facility] Managed Care Coordination team was consulted for assistance with disease management and care coordination needs.    Engaged with patient Engaged with patient by telephone for initial visit in response to referral for case management and/or care coordination services.  Ms. Mayben was given information about Managed Medicaid Care Coordination team services today. [name] [name] agreed to services and verbal consent obtained.   Objective:  Lab Results  Component Value Date   CREATININE 0.92 11/13/2019   CREATININE 1.00 08/30/2018   CREATININE 0.76 11/16/2017    Lab Results  Component Value Date   HGBA1C 6.3 (A) 11/22/2019       Component Value Date/Time   CHOL 159 11/13/2019 1351   TRIG 286 (H) 11/13/2019 1351   HDL 40 (L) 11/13/2019 1351   CHOLHDL 4.0 11/13/2019 1351   VLDL 44 (H) 07/12/2016 1137   LDLCALC 81 11/13/2019 1351    Other: (TSH, CBC, Vit D, etc.)  Clinical ASCVD: Yes  The 10-year ASCVD risk score [name], et al., 2013) is: 19.8%   Values used to calculate the score:     Age: 56 years     Sex: Female     Is Non-Hispanic African American: Yes     Diabetic: Yes     Tobacco smoker: No     Systolic Blood Pressure: 123456 mmHg     Is BP treated: Yes     HDL Cholesterol: 40 mg/dL     Total Cholesterol: 159 mg/dL    Other: (CHADS2VASc if Afib, PHQ9 if depression, MMRC or CAT for COPD, ACT, DEXA)  BP Readings from Last 3 Encounters:  05/01/20 (!) 144/82  02/11/20 122/74  02/11/20 136/81    Assessment/Interventions: Review of patient past medical history, allergies, medications, health status, including review of consultants reports, laboratory and other test data, was performed as part of comprehensive evaluation and provision of chronic care  management services.   HTN Checks BP daily:  123/74 on 04/25/20 BP Readings from Last 3 Encounters:  05/01/20 (!) 144/82  02/11/20 122/74  02/11/20 136/81   Metoprolol 25mg  BID Amlodipine Plan: At goal,  patient stable/ symptoms controlled   DM Lab Results  Component Value Date   HGBA1C 6.3 (A) 11/22/2019   HGBA1C 6.5 07/18/2019   HGBA1C 6.5 03/08/2019   Lab Results  Component Value Date   LDLCALC 81 11/13/2019   CREATININE 0.92 11/13/2019    Lab Results  Component Value Date   NA 140 11/13/2019   K 4.3 11/13/2019   CREATININE 0.92 11/13/2019   GFRNONAA 89 11/24/2015   GFRAA >89 11/24/2015   GLUCOSE 69 11/13/2019   Liraglutide (Due to steroids) Plan: At goal,  patient stable/ symptoms controlled   Lipids Lab Results  Component Value Date   CHOL 159 11/13/2019   CHOL 141 08/30/2018   CHOL 149 11/16/2017   Lab Results  Component Value Date   HDL 40 (L) 11/13/2019   HDL 32 (L) 08/30/2018   HDL 36 (L) 11/16/2017   Lab Results  Component Value Date   LDLCALC 81 11/13/2019   LDLCALC 79 08/30/2018   LDLCALC 76 11/16/2017   Lab Results  Component Value Date   TRIG 286 (H) 11/13/2019   TRIG 208 (H) 08/30/2018  TRIG 308 (H) 11/16/2017   Lab Results  Component Value Date   CHOLHDL 4.0 11/13/2019   CHOLHDL 4.4 08/30/2018   CHOLHDL 4.1 11/16/2017   No results found for: LDLDIRECT  Atorvastatin 10mg  (Take 0.5 tablet) Plan: At goal,  patient stable/ symptoms controlled   HIV Dx in [place] (Taking for years) Plan: At goal,  patient stable/ symptoms controlled   Mood Brother is sick and causes her stress Bupropion (Making her dizzy) Plan: Take Bupropion at night  Migraines -Has headaches every other day  -Migraines every other month Nortriptyline Sumatriptan Plan: At goal,  patient stable/ symptoms controlled    Pain Tramadol (not taking) -Hates Pain meds Plan: At goal,  patient stable/ symptoms  controlled    Itching Monteleukast Gabapentin 100mg  Cetirizine 10mg  Plan: Patient experiencing itching at night. Day/Noon doses of steroid could be masking during the day. She will reach out to specialist immediately, she told me she has f/u soon and didn't wish me to call   Addison's Hydrocortisone -Only taking BID, not TID due to weight gain Plan: Patient will let specialist know she's not taking as directed   SDOH (Social Determinants of Health) assessments and interventions performed:    Care Plan  Allergies  Allergen Reactions  . Acetaminophen Other (See Comments)    Inflamed liver, hospitalized REACTION: Had liver problems, was hospitalized Inflamed liver, hospitalized Other reaction(s): liver demage Other reaction(s): liver demage  . Morphine Hives and Rash    REACTION: rash  . Triamterene Hives  . Aspirin-Caffeine     Other reaction(s): liver demage  . Celexa [Citalopram]     Other reaction(s): INCREASED ANXIETY  . Dyazide [Hydrochlorothiazide W-Triamterene] Hives  . Empagliflozin     Other reaction(s): stomach ache, diarrhea  . Emtricitabine-Tenofovir Df     Other reaction(s): rash  . Lisinopril     Cough   . Metformin Hcl Er     Other reaction(s): upset stomach  . Morphine Sulfate     Other reaction(s): shortness of breath and hives  . Topamax [Topiramate]   . Triamterene-Hctz     Other reaction(s): rash  . Losartan Rash    Pt had rash, worsening dizziness, and nausea after starting losartan, which improved after stopping losartan    Medications Reviewed Today    Reviewed by Gayla Medicus, RN (Registered Nurse) on 05/07/20 at 1332  Med List Status: <None>  Medication Order Taking? Sig Documenting Provider Last Dose Status Informant  amLODipine (NORVASC) 10 MG tablet [id number] Yes TAKE ONE TABLET BY MOUTH DAILY [name], MD Taking Active   atorvastatin (LIPITOR) 10 MG tablet [id number] Yes Take 0.5 tablets (5 mg total) by mouth daily. Ladona Horns, MD Taking Active   bictegravir-emtricitabine-tenofovir AF (BIKTARVY) 50-200-25 MG TABS tablet [id number] Yes Take 1 tablet by mouth daily. Wilton Callas, NP Taking Active   budesonide [facility]) 180 MCG/ACT inhaler [id number] Yes 2 inhalations 2 times per day. Rinse, gargle, and spit after use. Kozlow, Donnamarie Poag, MD Taking Active   buPROPion (WELLBUTRIN XL) 150 MG 24 hr tablet [id number] Yes Take one tablet in the morning [name], MD Taking Active   cetirizine (ZYRTEC) 10 MG tablet [id number] Yes 1 tablet daily Kozlow, Donnamarie Poag, MD Taking Active   cholecalciferol (VITAMIN D) 25 MCG (1000 UNIT) tablet [id number] Yes TAKE ONE TABLET BY MOUTH DAILY Katsadouros, Vasilios, MD Taking Active   diclofenac Sodium (VOLTAREN) 1 % GEL [id number] Yes Apply 4 g topically 4 (four) times daily. Aslam,  Loralyn Freshwater, MD Taking Active   Elastic Bandages & Supports (WRIST/THUMB SPLINT/RIGHT MED) [place] [id number] Yes Wear on R thumb/wrist. Ladona Horns, MD Taking Active   famotidine (PEPCID) 40 MG tablet [id number] Yes TAKE ONE TABLET BY MOUTH AT BEDTIME Kozlow, Donnamarie Poag, MD Taking Active   gabapentin (NEURONTIN) 100 MG capsule [id number] Yes Take 1 capsule (100 mg total) by mouth 2 (two) times daily. Kozlow, Donnamarie Poag, MD Taking Active   hydrocortisone (CORTEF) 10 MG tablet [id number] Yes Take 10 mg by mouth as directed. Pt takes 10 mg in the am and 5 mg at 11 am and 3 pm [provider] Taking Active   liraglutide (VICTOZA) 18 MG/3ML SOPN [id number] Yes Inject into the skin. [provider] Taking Active   metoprolol tartrate (LOPRESSOR) 25 MG tablet [id number] Yes TAKE ONE TABLET BY MOUTH TWICE A Worthy Keeler, MD Taking Active   montelukast (SINGULAIR) 10 MG tablet [id number] Yes TAKE ONE TABLET BY MOUTH AT BEDTIME Kozlow, Donnamarie Poag, MD Taking Active   nitroGLYCERIN (NITROSTAT) 0.4 MG SL tablet [id number] Yes Place 1 tablet (0.4 mg total) under the tongue every 5 (five) minutes as needed for  chest pain. Isaiah Serge, NP Taking Active   nortriptyline (PAMELOR) 10 MG capsule [id number] Yes Take 2 capsules (20 mg total) by mouth at bedtime. Suzzanne Cloud, NP Taking Active   pantoprazole (PROTONIX) 40 MG tablet [id number] Yes Take 1 tablet (40 mg total) by mouth 2 (two) times daily. Jean Rosenthal, MD Taking Active   PROAIR HFA [phone] Base) MCG/ACT inhaler [id number] Yes INHALE TWO PUFFS BY MOUTH EVERY FOUR TO SIX HOURS AS NEEDED FOR COUGH OR WHEEZE Kozlow, Donnamarie Poag, MD Taking Active   promethazine (PHENERGAN) 25 MG tablet [id number] Yes Take 1 tablet (25 mg total) by mouth every 6 (six) hours as needed for nausea or vomiting. [name], DO Taking Active   sodium chloride (OCEAN) 0.65 % SOLN nasal spray [id number] Yes PLACE 1 SPRAY INTO BOTH NOSTRILS AS NEEDED FOR CONGESTION. [name], DO Taking Active   traMADol (ULTRAM) 50 MG tablet [id number] Yes Take 1 tablet (50 mg total) by mouth every 6 (six) hours as needed. Pete Pelt, PA-C Taking Active   triamcinolone (NASACORT) 55 MCG/ACT AERO nasal inhaler [id number] Yes 1 spray each nostril 2 times per day Jiles Prows, MD Taking Active           Patient Active Problem List   Diagnosis Date Noted  . Arm pain 02/10/2020  . Dizziness on standing 12/27/2019  . Generalized anxiety disorder with panic attacks 06/20/2019  . 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 Chronic migraine w/o aura w/o status migrainosus, not intractable 08/27/2018  . Disorder of left eustachian tube 05/19/2017  . Vertigo of central origin 01/23/2017  . Central perforation of tympanic membrane of left ear 12/09/2016  . Eustachian tube dysfunction, bilateral 06/15/2016  . Ear pain, right 06/15/2016  . 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 BPPV (benign paroxysmal positional vertigo) 10/02/2015  . Liver fibrosis ([place]) 12/04/2014  . Pituitary microadenoma ([place]) 08/08/2014  .  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 Vitamin D deficiency 06/29/2014  . Secondary adrenal insufficiency ([place]) 06/29/2014  . History of tympanostomy tube placement 02/07/2014  . Refusal of blood transfusions as patient is Jehovah's Witness 11/18/2013  . Hepatitis C virus infection cured after antiviral drug therapy 03/16/2012  . Health care maintenance 12/15/2011  . Opioid dependence (  [place]) 10/05/2010  . HTN (hypertension) 10/05/2010  . Hyperlipidemia associated with type 2 diabetes mellitus ([place]) 10/23/2008  . HIV disease ([place]) 05/09/2006  . Depression 05/09/2006  . Allergic rhinitis 05/09/2006  . GERD 05/09/2006    Conditions to be addressed/monitored: HTN, HLD, DM and Anxiety  Patient Care Plan: General Plan of Care (Adult)    Problem Identified: Health Promotion or Disease Self-Management (General Plan of Care)   Priority: Medium  Onset Date: 05/07/2020    Patient Care Plan: General Plan of Care (Adult)    Problem Identified: Health Promotion or Disease Self-Management (General Plan of Care)     Goal: Self-Management Plan Developed   Note:   Current Barriers:  . Chronic Disease Management support and education needs. . Patient helping to provide care to brother.  Nurse Case Manager Clinical Goal(s):  Marland Kitchen Over the next 30 days, patient will attend all scheduled medical appointments. . Over the next 30 days, patient will work with CM team pharmacist to review medications.  Interventions:  . Inter-disciplinary care team collaboration (see longitudinal plan of care) . Evaluation of current treatment plan and patient's adherence to plan as established by provider. . Reviewed medications with patient. Nash Dimmer with pharmacy regarding medications. . Discussed plans with patient for ongoing care management follow up and provided patient with direct contact information for care management team . Reviewed scheduled/upcoming provider appointments. . Pharmacy  referral for medication review.  Patient Goals/Self-Care Activities Over the next 30 days, patient will:  -Attends all scheduled provider appointments Calls pharmacy for medication refills Calls provider office for new concerns or questions  Follow Up Plan: The Managed Medicaid care management team will reach out to the patient again over the next 30 days.  The patient has been provided with contact information for the Managed Medicaid care management team and has been advised to call with any health related questions or concerns.      Task: Mutually Develop and Royce Macadamia Achievement of Patient Goals   Note:   Care Management Activities:    - verbalization of feelings encouraged    Notes:    Patient Care Plan: Medication Management    Problem Identified: Health Promotion or Disease Self-Management (General Plan of Care)     Goal: Self-Management Plan Developed   Note:   Current Barriers:  . Unable to self administer medications as prescribed . Unable to explain what BP, Hyperlipidemia, and other disease states do to the body . Itching at night . ADR's from steroid TID  Pharmacist Clinical Goal(s):  Marland Kitchen Over the next 90 days, patient will contact provider office for questions/concerns as evidenced notation of same in electronic health record through collaboration with PharmD and provider.  .   Interventions: . Inter-disciplinary care team collaboration (see longitudinal plan of care) . Comprehensive medication review performed; medication list updated in electronic medical record . Patient will let specialist know, has appt soon, about how she isn't taking steroid TID (As directed) due to ADR's (Weight gain). I believe this is why she's itching at night (She's itching daily but the AM and noon doses are hiding it)  @RXCPDIABETES @ @RXCPHYPERTENSION @ @RXCPHYPERLIPIDEMIA @ @RXCPMENTALHEALTH @  Patient Goals/Self-Care Activities . Over the next 90 days, patient will:  - take  medications as prescribed  Follow Up Plan: The care management team will reach out to the patient again over the next 90 days.     Task: Mutually Develop and Royce Macadamia Achievement of Patient Goals   Note:   Care Management Activities:    -  verbalization of feelings encouraged    Notes:      Medication Assistance: None required. Patient affirms current coverage meets needs.   Follow up: Agree/  Plan: The care management team will reach out to the patient again over the next 90 days.   [place] Constable, Pharm.D., Managed Medicaid Pharmacist - [phone]

## 2020-05-13 ENCOUNTER — Telehealth: Payer: Self-pay

## 2020-05-13 NOTE — Telephone Encounter (Signed)
Per [name] [name] patient did not receive greater than 50% reduction from her injections therefore her [id number] procedure denied - I told her we performed the 3rd on because had not received response to second req for clinicals in 72 hours - assumed it was approved.  Advised MD - charge will be written off.

## 2020-05-21 ENCOUNTER — Other Ambulatory Visit: Payer: Medicaid Other

## 2020-05-21 ENCOUNTER — Other Ambulatory Visit: Payer: Self-pay

## 2020-05-21 DIAGNOSIS — D352 Benign neoplasm of pituitary gland: Secondary | ICD-10-CM | POA: Diagnosis not present

## 2020-05-21 DIAGNOSIS — B20 Human immunodeficiency virus [HIV] disease: Secondary | ICD-10-CM | POA: Diagnosis not present

## 2020-05-21 DIAGNOSIS — R635 Abnormal weight gain: Secondary | ICD-10-CM | POA: Diagnosis not present

## 2020-05-21 DIAGNOSIS — E2749 Other adrenocortical insufficiency: Secondary | ICD-10-CM | POA: Diagnosis not present

## 2020-05-21 DIAGNOSIS — E119 Type 2 diabetes mellitus without complications: Secondary | ICD-10-CM | POA: Diagnosis not present

## 2020-05-22 LAB — T-HELPER CELL (CD4) - (RCID CLINIC ONLY)
CD4 % Helper T Cell: 29 % — ABNORMAL LOW (ref 33–65)
CD4 T Cell Abs: 553 /uL (ref 400–1790)

## 2020-05-24 LAB — BASIC METABOLIC PANEL
BUN: 13 mg/dL (ref 7–25)
CO2: 29 mmol/L (ref 20–32)
Calcium: 9.7 mg/dL (ref 8.6–10.4)
Chloride: 103 mmol/L (ref 98–110)
Creat: 0.91 mg/dL (ref 0.50–1.05)
Glucose, Bld: 117 mg/dL — ABNORMAL HIGH (ref 65–99)
Potassium: 4.2 mmol/L (ref 3.5–5.3)
Sodium: 139 mmol/L (ref 135–146)

## 2020-05-24 LAB — HIV-1 RNA QUANT-NO REFLEX-BLD
HIV 1 RNA Quant: <20 Copies/mL — ABNORMAL HIGH
HIV-1 RNA Quant, Log: <1.3 Log cps/mL — ABNORMAL HIGH

## 2020-05-25 ENCOUNTER — Other Ambulatory Visit: Payer: Medicaid Other

## 2020-05-26 ENCOUNTER — Other Ambulatory Visit: Payer: Self-pay | Admitting: Internal Medicine

## 2020-06-02 ENCOUNTER — Other Ambulatory Visit: Payer: Self-pay | Admitting: Obstetrics and Gynecology

## 2020-06-02 ENCOUNTER — Other Ambulatory Visit: Payer: Self-pay

## 2020-06-02 NOTE — Patient Instructions (Signed)
Hi Ms. Sakuma, thank  You for speaking with me today.  Ms. Bollig was given information about Medicaid Managed Care team care coordination services as a part of their [place] Medicaid benefit. [name] verbally consented to engagement with the [facility] Managed Care team.   For questions related to your [facility], please call: [phone] or visit the homepage here: [email or web address]  If you would like to schedule transportation through your [facility], please call the following number at least 2 days in advance of your appointment: [phone]  Patient verbalizes understanding of instructions provided today.   The Managed Medicaid care management team will reach out to the patient again over the next 30 days.  The patient has been provided with contact information for the Managed Medicaid care management team and has been advised to call with any health related questions or concerns.   Aida Raider RN, BSN [place]  [facility]ator - Managed Medicaid High Risk [phone].  Following is a copy of your plan of care:      Patient Care Plan: General Plan of Care (Adult)    Problem Identified: Health Promotion or Disease Self-Management (General Plan of Care)     Long-Range Goal: Self-Management Plan Developed   Start Date: 05/07/2020  Expected End Date: 08/05/2020  This Visit's Progress: Not on track  Priority: High  Note:   Current Barriers:  . Chronic Disease Management support and education needs. . Patient helping to provide care to brother. This causes patient a lot of stress and anxiety.  She has recently started taking Wellbutrin which she states is helping.  Patient declines any therapy services at this time.  Nurse Case Manager Clinical Goal(s):  Marland Kitchen Over the next 30 days, patient will attend all  scheduled medical appointments. . Over the next 30 days, patient will work with CM team pharmacist to review medications. Marland Kitchen Update 06/02/20:  Patient met with Pharmacist 05/11/20 and has follow up scheduled.  Interventions:  . Inter-disciplinary care team collaboration (see longitudinal plan of care) . Evaluation of current treatment plan and patient's adherence to plan as established by provider. . Reviewed medications with patient. Nash Dimmer with pharmacy regarding medications. . Discussed plans with patient for ongoing care management follow up and provided patient with direct contact information for care management team . Reviewed scheduled/upcoming provider appointments. . Pharmacy referral for medication review.  Patient Goals/Self-Care Activities Over the next 30 days, patient will:  -Attends all scheduled provider appointments Calls pharmacy for medication refills Calls provider office for new concerns or questions  Follow Up Plan: The Managed Medicaid care management team will reach out to the patient again over the next 30 days.  The patient has been provided with contact information for the Managed Medicaid care management team and has been advised to call with any health related questions or concerns.

## 2020-06-02 NOTE — Patient Outreach (Signed)
Medicaid Managed Care   Nurse Care Manager Note  06/02/2020 Name:  [name] [name] MRN:  [id number] DOB:  01-09-1961  [name] [name] is an 60 y.o. year old female who is a primary patient of [name], MD.  The [facility] Managed Care Coordination team was consulted for assistance with:    chronic healthcare management needs.  [name] [name] was given information about Medicaid Managed Care Coordination team services today. [name] [name] agreed to services and verbal consent obtained.  Engaged with patient by telephone for follow up visit in response to provider referral for case management and/or care coordination services.   Assessments/Interventions:  Review of past medical history, allergies, medications, health status, including review of consultants reports, laboratory and other test data, was performed as part of comprehensive evaluation and provision of chronic care management services.  SDOH (Social Determinants of Health) assessments and interventions performed:   Care Plan  Allergies  Allergen Reactions  . Acetaminophen Other (See Comments)    Inflamed liver, hospitalized REACTION: Had liver problems, was hospitalized Inflamed liver, hospitalized Other reaction(s): liver demage Other reaction(s): liver demage  . Morphine Hives and Rash    REACTION: rash  . Triamterene Hives  . Aspirin-Caffeine     Other reaction(s): liver demage  . Celexa [Citalopram]     Other reaction(s): INCREASED ANXIETY  . Dyazide [Hydrochlorothiazide W-Triamterene] Hives  . Empagliflozin     Other reaction(s): stomach ache, diarrhea  . Emtricitabine-Tenofovir Df     Other reaction(s): rash  . Lisinopril     Cough   . Metformin Hcl Er     Other reaction(s): upset stomach  . Morphine Sulfate     Other reaction(s): shortness of breath and hives  . Topamax [Topiramate]   . Triamterene-Hctz     Other reaction(s): rash  . Losartan Rash    Pt had rash, worsening dizziness, and nausea after  starting losartan, which improved after stopping losartan    Medications Reviewed Today    Reviewed by Gayla Medicus, RN (Registered Nurse) on 06/02/20 at 1114  Med List Status: <None>  Medication Order Taking? Sig Documenting Provider Last Dose Status Informant  amLODipine (NORVASC) 10 MG tablet [id number]  TAKE ONE TABLET BY MOUTH DAILY [name], MD  Active   atorvastatin (LIPITOR) 10 MG tablet [id number]  Take 0.5 tablets (5 mg total) by mouth daily. Ladona Horns, MD  Active   bictegravir-emtricitabine-tenofovir AF (BIKTARVY) 50-200-25 MG TABS tablet [id number]  Take 1 tablet by mouth daily. Yakima Callas, NP  Active   budesonide [facility]) 180 MCG/ACT inhaler [id number]  2 inhalations 2 times per day. Rinse, gargle, and spit after use. Kozlow, Donnamarie Poag, MD  Active   buPROPion (WELLBUTRIN XL) 150 MG 24 hr tablet [id number] Yes Take one tablet in the morning [name], MD Taking Active            Med Note Merrilyn Puma, Rhona Leavens May 11, 2020 11:34 AM) Making her dizzy, will do trial at HS  cetirizine (ZYRTEC) 10 MG tablet [id number]  1 tablet daily Kozlow, Donnamarie Poag, MD  Active   cholecalciferol (VITAMIN D) 25 MCG (1000 UNIT) tablet [id number]  TAKE ONE TABLET BY MOUTH DAILY Riesa Pope, MD  Active   diclofenac Sodium (VOLTAREN) 1 % GEL [id number]  APPLY FOUR GRAMS TOPICALLY FOUR TIMES A Worthy Keeler, MD  Active   Elastic Bandages & Supports (WRIST/THUMB SPLINT/RIGHT MED) MISC [id number]  Wear on R thumb/wrist. Ladona Horns,  MD  Active   famotidine (PEPCID) 40 MG tablet [id number]  TAKE ONE TABLET BY MOUTH AT BEDTIME Kozlow, Donnamarie Poag, MD  Active   gabapentin (NEURONTIN) 100 MG capsule [id number] Yes Take 1 capsule (100 mg total) by mouth 2 (two) times daily. Kozlow, Donnamarie Poag, MD Taking Active   hydrocortisone (CORTEF) 10 MG tablet [id number]  Take 10 mg by mouth as directed. Pt takes 10 mg in the am and 5 mg at 11 am and 3 pm [provider]  Active             Med Note Bonnita Levan May 11, 2020 11:25 AM) Only taking BID, morning and noon. Taking in evening makes her gain too much weight  liraglutide (VICTOZA) 18 MG/3ML SOPN [id number]  Inject into the skin. [provider]  Active   metoprolol tartrate (LOPRESSOR) 25 MG tablet [id number]  TAKE ONE TABLET BY MOUTH TWICE A DAY Katsadouros, Vasilios, MD  Active   montelukast (SINGULAIR) 10 MG tablet [id number]  TAKE ONE TABLET BY MOUTH AT BEDTIME Kozlow, Donnamarie Poag, MD  Active   nitroGLYCERIN (NITROSTAT) 0.4 MG SL tablet [id number]  Place 1 tablet (0.4 mg total) under the tongue every 5 (five) minutes as needed for chest pain. Isaiah Serge, NP  Active   nortriptyline (PAMELOR) 10 MG capsule [id number]  Take 2 capsules (20 mg total) by mouth at bedtime. Suzzanne Cloud, NP  Active   pantoprazole (PROTONIX) 40 MG tablet [id number]  Take 1 tablet (40 mg total) by mouth 2 (two) times daily. Jean Rosenthal, MD  Active   PROAIR HFA 108 [phone] Base) MCG/ACT inhaler [id number]  INHALE TWO PUFFS BY MOUTH EVERY FOUR TO SIX HOURS AS NEEDED FOR COUGH OR WHEEZE Kozlow, Donnamarie Poag, MD  Active   promethazine (PHENERGAN) 25 MG tablet [id number]  Take 1 tablet (25 mg total) by mouth every 6 (six) hours as needed for nausea or vomiting. Kalman Shan Ratliff, DO  Active   sodium chloride (OCEAN) 0.65 % SOLN nasal spray [id number]  PLACE 1 SPRAY INTO BOTH NOSTRILS AS NEEDED FOR CONGESTION. Kalman Shan Ratliff, DO  Active   traMADol (ULTRAM) 50 MG tablet [id number]  Take 1 tablet (50 mg total) by mouth every 6 (six) hours as needed.  Patient not taking: Reported on 05/11/2020   Pete Pelt, PA-C  Active   triamcinolone (NASACORT) 55 MCG/ACT AERO nasal inhaler [id number]  1 spray each nostril 2 times per day Jiles Prows, MD  Active           Patient Active Problem List   Diagnosis Date Noted  . Arm pain 02/10/2020  . Dizziness on standing 12/27/2019  . Generalized anxiety disorder with panic attacks  06/20/2019  . 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 Chronic migraine w/o aura w/o status migrainosus, not intractable 08/27/2018  . Disorder of left eustachian tube 05/19/2017  . Vertigo of central origin 01/23/2017  . Central perforation of tympanic membrane of left ear 12/09/2016  . Eustachian tube dysfunction, bilateral 06/15/2016  . Ear pain, right 06/15/2016  . 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 BPPV (benign paroxysmal positional vertigo) 10/02/2015  . Liver fibrosis ([place]) 12/04/2014  . Pituitary microadenoma ([place]) 08/08/2014  . 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[employer] [facility] [employer] [facility]) 08/05/2014  . Vitamin D deficiency 06/29/2014  . Secondary adrenal insufficiency ([place]) 06/29/2014  . History of tympanostomy tube placement 02/07/2014  . Refusal of blood transfusions as patient is  Jehovah's Witness 11/18/2013  . Hepatitis C virus infection cured after antiviral drug therapy 03/16/2012  . Health care maintenance 12/15/2011  . Opioid dependence ([place]) 10/05/2010  . HTN (hypertension) 10/05/2010  . Hyperlipidemia associated with type 2 diabetes mellitus ([place]) 10/23/2008  . HIV disease ([place]) 05/09/2006  . Depression 05/09/2006  . Allergic rhinitis 05/09/2006  . GERD 05/09/2006    Conditions to be addressed/monitored per PCP order:  chronic healthcare management needs, HIV, HTN, DM2  Care Plan : General Plan of Care (Adult)  Updates made by Gayla Medicus, RN since 06/02/2020 12:00 AM    Problem: Health Promotion or Disease Self-Management (General Plan of Care)     Long-Range Goal: Self-Management Plan Developed   Start Date: 05/07/2020  Expected End Date: 08/05/2020  This Visit's Progress: Not on track  Priority: High  Note:   Current Barriers:  . Chronic Disease Management support and education needs. . Patient helping to provide care to brother. This causes patient a lot of stress and anxiety.  She has recently started taking Wellbutrin which  she states is helping.  Patient declines any therapy services at this time.  Nurse Case Manager Clinical Goal(s):  Marland Kitchen Over the next 30 days, patient will attend all scheduled medical appointments. . Over the next 30 days, patient will work with CM team pharmacist to review medications. Marland Kitchen Update 06/02/20:  Patient met with Pharmacist 05/11/20 and has follow up scheduled.  Interventions:  . Inter-disciplinary care team collaboration (see longitudinal plan of care) . Evaluation of current treatment plan and patient's adherence to plan as established by provider. . Reviewed medications with patient. Nash Dimmer with pharmacy regarding medications. . Discussed plans with patient for ongoing care management follow up and provided patient with direct contact information for care management team . Reviewed scheduled/upcoming provider appointments. . Pharmacy referral for medication review.  Patient Goals/Self-Care Activities Over the next 30 days, patient will:  -Attends all scheduled provider appointments Calls pharmacy for medication refills Calls provider office for new concerns or questions  Follow Up Plan: The Managed Medicaid care management team will reach out to the patient again over the next 30 days.  The patient has been provided with contact information for the Managed Medicaid care management team and has been advised to call with any health related questions or concerns.      Follow Up:  Patient agrees to Care Plan and Follow-up.  Plan: The Managed Medicaid care management team will reach out to the patient again over the next 30 days. and The patient has been provided with contact information for the Managed Medicaid care management team and has been advised to call with any health related questions or concerns.  Date/time of next scheduled RN care management/care coordination outreach 06/30/20 at 1030.

## 2020-06-08 ENCOUNTER — Other Ambulatory Visit: Payer: Self-pay | Admitting: Internal Medicine

## 2020-06-08 ENCOUNTER — Telehealth (INDEPENDENT_AMBULATORY_CARE_PROVIDER_SITE_OTHER): Payer: Medicaid Other | Admitting: Infectious Diseases

## 2020-06-08 ENCOUNTER — Other Ambulatory Visit: Payer: Self-pay

## 2020-06-08 ENCOUNTER — Encounter: Payer: Self-pay | Admitting: Infectious Diseases

## 2020-06-08 DIAGNOSIS — K219 Gastro-esophageal reflux disease without esophagitis: Secondary | ICD-10-CM

## 2020-06-08 DIAGNOSIS — F4321 Adjustment disorder with depressed mood: Secondary | ICD-10-CM | POA: Diagnosis not present

## 2020-06-08 DIAGNOSIS — B20 Human immunodeficiency virus [HIV] disease: Secondary | ICD-10-CM

## 2020-06-08 NOTE — Assessment & Plan Note (Signed)
Very well-controlled HIV disease on Biktarvy once daily.  Her viral load is undetectable and CD4 count remains in a normal recovered range.  No changes to the medical plan of care here.  No new medications or drug interactions noted.  Heavy on her heart today is the grief and upcoming grief over the anticipated loss of her brother. Vaccines are up-to-date including Covid booster.  Follow-up in 9 months for regular HIV care.

## 2020-06-08 NOTE — Progress Notes (Signed)
Subjective:    [name], female    DOB: [birth year], 60 y.o.   MRN: [id number]   VIRTUAL CARE ENCOUNTER  I connected with [name] [name] on 06/08/20 at  2:45 PM EST by Telephone and verified that I am speaking with the correct person using two identifiers.   I discussed the limitations, risks, security and privacy concerns of performing an evaluation and management service by telephone and the availability of in person appointments. I also discussed with the patient that there may be a patient responsible charge related to this service. The patient expressed understanding and agreed to proceed.  Patient Location: [place] residence   Other Participants: none  Provider Location: [facility] Office     No chief complaint on file.   Brief Narrative:  [name] [name] is a 60 y.o. female with well controlled HIV (VL 36, CD4 800) on Biktarvy. She does not miss any doses of her HAART. She is a type 2 diabetic, on chronic prednisone for Addison's disease a h/o pituitary adenoma.   Previous regimens for HIV treatment include Lexiva/Norvir/Truvada, Isentress/Truvada (rashes), Prezcobix/Truvada (rash). History of treated Hepatitis C (completed 04/2013 with [facility]).     HPI:  "Not a good day" - had to make the decision to put her brother in Hospice today. Very fresh on her mind. He has been in a nursing facility and a hospital recently.  This has been very hard for her.  She knows has been coming for a while now and she is been setting aside money to help with his funeral arrangements as he had no life insurance.  He is lost a tremendous amount of weight and has really been suffering with no improvement with medical interventions.  She states that she is not quite sure how she is doing it as she has a lot to look forward to in the next timeframe and want to plan.  She is taking some Wellbutrin chronically for depression but feels that her nerves may need some extra help.  She thinks she is in a be  okay for right now.  She continues on her Biktarvy once a day with no concern of her missed dose.  She is actually quite happy that we were able to do a teleconference today versus coming into the office.  Pfizer booster 01/2020. Received flu shot this year in September.     Review of Systems  Constitutional: Positive for fatigue. Negative for appetite change, chills, fever and unexpected weight change.  HENT: Negative for mouth sores, sore throat and trouble swallowing.   Eyes: Negative for pain and visual disturbance.  Respiratory: Negative for cough and shortness of breath.   Cardiovascular: Negative.  Negative for chest pain.  Gastrointestinal: Negative for abdominal pain, diarrhea, nausea and vomiting.  Genitourinary: Negative for dysuria, menstrual problem and pelvic pain.  Musculoskeletal: Negative for back pain, myalgias and neck pain.  Skin: Negative for color change and rash.  Neurological: Negative for weakness, numbness and headaches.  Hematological: Negative for adenopathy.  Psychiatric/Behavioral: Positive for dysphoric mood and sleep disturbance. The patient is nervous/anxious.        Objective:   Physical Exam Pulmonary:     Effort: Pulmonary effort is normal.     Comments: No shortness of breath detected in conversation.  Neurological:     Mental Status: She is oriented to person, place, and time.  Psychiatric:        Thought Content: Thought content normal.  Judgment: Judgment normal.     Comments: She is crying and appropriately sad given the context of the situation       Assessment & Plan:   Problem List Items Addressed This Visit      Unprioritized   HIV disease ([place]) (Chronic)    Very well-controlled HIV disease on Biktarvy once daily.  Her viral load is undetectable and CD4 count remains in a normal recovered range.  No changes to the medical plan of care here.  No new medications or drug interactions noted.  Heavy on her heart today is the  grief and upcoming grief over the anticipated loss of her brother. Vaccines are up-to-date including Covid booster.  Follow-up in 9 months for regular HIV care.      Grief reaction    Faiza's facing difficult medical decisions for her brother who is actively dying on hospice care.  She has a lot of grief and anxiety that has triggered recently.  We did discuss the option to get her into see a counselor to help work her through the grief reaction.  She thinks that this would be a very good idea but uncertain if she is ready to go commit to doing this.  I will call her in a week and 1/2 to 2 weeks to check in and arrange if she feels like she is ready.         Follow Up Instructions: As above   I discussed the assessment and treatment plan with the patient. The patient was provided an opportunity to ask questions and all were answered. The patient agreed with the plan and demonstrated an understanding of the instructions.   The patient was advised to call back or seek an in-person evaluation if the symptoms worsen or if the condition fails to improve as anticipated.  I provided 7 minutes of non-face-to-face time during this encounter.   [name], MSN, NP-C [facility] for Infectious Disease [email or web address] Pager: [phone] Office: [phone] [place]: [phone]

## 2020-06-08 NOTE — Assessment & Plan Note (Signed)
[name] [name]'s facing difficult medical decisions for her brother who is actively dying on hospice care.  She has a lot of grief and anxiety that has triggered recently.  We did discuss the option to get her into see a counselor to help work her through the grief reaction.  She thinks that this would be a very good idea but uncertain if she is ready to go commit to doing this.  I will call her in a week and 1/2 to 2 weeks to check in and arrange if she feels like she is ready.

## 2020-06-08 NOTE — Patient Instructions (Signed)
It was very nice to talk to you on the phone today.  I am glad to hear that you are staying well and keeping healthy.    Your medication is working perfectly for you and you are doing a great job taking it as prescribed  Please continue your Biktarvy once daily as we discussed.   Vaccines Recommended:   none  Recommended Next Office Visit:   9 months with labs prior to please   Please continue to be well, stay away from folks that are sick, wash her hands frequently, do not touch her face or mouth and continue to take your medications.   Lyssa I am so sorry to hear of the anticipated loss of your dear brother. Will plan to check in with you via phone in a week or two to see how you are doing. My best to you and your family for peace.

## 2020-06-09 MED ORDER — PANTOPRAZOLE SODIUM 40 MG PO TBEC: 40.0000 mg | DELAYED_RELEASE_TABLET | Freq: Two times a day (BID) | ORAL | 2 refills | Status: DC

## 2020-06-11 ENCOUNTER — Ambulatory Visit: Payer: Medicaid Other

## 2020-06-12 ENCOUNTER — Encounter: Payer: Medicaid Other | Attending: Physical Medicine & Rehabilitation | Admitting: Physical Medicine & Rehabilitation

## 2020-06-12 ENCOUNTER — Encounter: Payer: Self-pay | Admitting: Physical Medicine & Rehabilitation

## 2020-06-12 ENCOUNTER — Other Ambulatory Visit: Payer: Self-pay

## 2020-06-12 VITALS — BP 130/82 | HR 89 | Temp 98.4°F | Ht 61.0 in | Wt 177.0 lb

## 2020-06-12 DIAGNOSIS — M461 Sacroiliitis, not elsewhere classified: Secondary | ICD-10-CM | POA: Diagnosis not present

## 2020-06-12 NOTE — Progress Notes (Signed)
Subjective:    [name], female    DOB: 1960-09-19, 60 y.o.   MRN: [id number]  HPI   Pain Inventory Average Pain 10 Pain Right Now 9 My pain is sharp, dull, stabbing, tingling and aching  In the last 24 hours, has pain interfered with the following? General activity 9 Relation with others 5 Enjoyment of life 7 What TIME of day is your pain at its worst? morning , daytime, evening and night Sleep (in general) Poor  Pain is worse with: walking and bending Pain improves with: rest and injections Relief from Meds: no pain meds  Family History  Problem Relation Age of Onset  . Heart disease Mother   . Diabetes Mother   . Stroke Mother   . Heart disease Father   . Stroke Father   . Diabetes Father   . Hepatitis Sister        hcv  . Asthma Sister   . Allergic rhinitis Sister   . Stroke Other   . Colon polyps Brother   . Renal Disease Brother   . Cancer Sister        lung  . Asthma Sister   . Allergic rhinitis Sister   . Cancer Maternal Aunt   . Cancer Maternal Aunt   . Colon cancer Neg Hx   . Esophageal cancer Neg Hx   . Stomach cancer Neg Hx   . Rectal cancer Neg Hx   . Angioedema Neg Hx   . Eczema Neg Hx   . Urticaria Neg Hx    Social History   Socioeconomic History  . Marital status: Single    Spouse name: Not on file  . Number of children: 0  . Years of education: 20  . Highest education level: High school graduate  Occupational History  . Occupation: Disabled  Tobacco Use  . Smoking status: Former Smoker    Years: 20.00    Types: Cigarettes    Quit date: 04/26/2007    Years since quitting: 13.1  . Smokeless tobacco: Never Used  . Tobacco comment: QUIT 2009  Vaping Use  . Vaping Use: Never used  Substance and Sexual Activity  . Alcohol use: No    Alcohol/week: 0.0 standard drinks  . Drug use: No  . Sexual activity: Not Currently    Partners: Male    Birth control/protection: Condom    Comment: pt. given condoms  Other  Topics Concern  . Not on file  Social History Narrative   Lives at home alone.   Right-handed.   Occasional tea or soda.   Te   Social Determinants of Radio broadcast assistant Strain: Not on file  Food Insecurity: Not on file  Transportation Needs: Not on file  Physical Activity: Not on file  Stress: Not on file  Social Connections: Not on file   Past Surgical History:  Procedure Laterality Date  . BUNIONECTOMY     b/l  . COLECTOMY     2003 for diverticulitis, had colostomy bag and then reversed  . COLONOSCOPY    . HAND SURGERY    . NASAL SINUS SURGERY    . SHOULDER SURGERY     left  . TONSILLECTOMY     Past Surgical History:  Procedure Laterality Date  . BUNIONECTOMY     b/l  . COLECTOMY     2003 for diverticulitis, had colostomy bag and then reversed  . COLONOSCOPY    . HAND SURGERY    .  NASAL SINUS SURGERY    . SHOULDER SURGERY     left  . TONSILLECTOMY     Past Medical History:  Diagnosis Date  . Allergic rhinitis 05/09/2006  . Allergy   . Anxiety   . Arthritis   . Asthma   . CHF (congestive heart failure) ([place])   . Chronic back pain   . Diabetes mellitus without complication ([place])   . GERD (gastroesophageal reflux disease)   . Heart murmur    as a child  . 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 s/p 12 week course of simeprevir and sofosbuvir between October 2014 and January 2015 with resolution.  Marland Kitchen History of shingles   . HIV infection ([place])    1994  . Hyperlipidemia    no meds taken now  . Hypertension   . Migraine   . Pituitary microadenoma ([place]) 08/08/2014  . Prediabetes   . Refusal of blood transfusions as patient is Jehovah's Witness   . Secondary adrenal insufficiency ([place]) 06/29/2014  . Urticaria    BP 130/82   Pulse 89   Temp 98.4 F (36.9 C)   Ht 5\' 1"  (1.549 m)   Wt 177 lb (80.3 kg)   SpO2 98%   BMI 33.44 kg/m   Opioid Risk Score:   Fall Risk Score:  `1  Depression screen PHQ  2/9  Depression screen [facility] 2/9 02/07/2020 11/27/2019 11/22/2019 11/22/2019 11/05/2019 06/20/2019 03/18/2019  Decreased Interest 0 0 2 2 1  0 0  Down, Depressed, Hopeless 0 0 1 1 1  0 0  PHQ - 2 Score 0 0 3 3 2  0 0  Altered sleeping - - 3 3 3 3  -  Tired, decreased energy - - 2 2 3 1  -  Change in appetite - - 2 2 2 1  -  Feeling bad or failure about yourself  - - 0 0 0 0 -  Trouble concentrating - - 1 1 1 1  -  Moving slowly or fidgety/restless - - 0 0 0 0 -  Suicidal thoughts - - 0 0 0 0 -  PHQ-9 Score - - 11 11 11 6  -  Difficult doing work/chores - - Not difficult at all Not difficult at all Somewhat difficult Not difficult at all -  Some recent data might be hidden    Review of Systems  Constitutional: Negative.   HENT: Negative.   Eyes: Negative.   Respiratory: Negative.   Cardiovascular: Negative.   Gastrointestinal: Negative.   Endocrine: Negative.   Genitourinary: Negative.   Musculoskeletal: Positive for arthralgias and back pain.  Skin: Negative.   Allergic/Immunologic: Negative.   Neurological: Negative.   Hematological: Negative.   Psychiatric/Behavioral: Negative.   All other systems reviewed and are negative.      Objective:   Physical Exam Vitals and nursing note reviewed.  Constitutional:      General: She is not in acute distress.    Appearance: She is obese.  Eyes:     Extraocular Movements: Extraocular movements intact.     Conjunctiva/sclera: Conjunctivae normal.     Pupils: Pupils are equal, round, and reactive to light.  Musculoskeletal:     Comments:  Sacral thrust (prone) :+ Lateral compression:+ left FABER's: + Bilateral Distraction (supine):- Thigh thrust test:+ bilateral    Neurological:     Mental Status: She is alert and oriented to person, place, and time.     Cranial Nerves: No dysarthria.  Gait: Gait is intact.     Comments: Motor strength is 5/5 bilateral hip flexor knee extensor ankle dorsiflexor Negative straight leg raise   Psychiatric:        Mood and Affect: Mood normal.        Behavior: Behavior normal.        Thought Content: Thought content normal.        Judgment: Judgment normal.           Assessment & Plan:  1.  Bilateral sacroiliac d/o, this impedes her self-care and mobility.  Provocative testing is positive bilaterally, worse on the left.  Has had good relief for 2 to 3 months following sacroiliac injections under fluoroscopic guidance with corticosteroid.  Her HIV is controlled so that there is no increased risk of infection related to procedures. We discussed other treatment options including doing sacroiliac nerve blocks to assess potential efficacy of sacroiliac radiofrequency neurotomy.  Patient would like to continue with her current treatment regimen which consist of bilateral sacroiliac intra-articular injections every 3 months.  Patient is not taking any regular prescription pain medication.

## 2020-06-19 DIAGNOSIS — D352 Benign neoplasm of pituitary gland: Secondary | ICD-10-CM | POA: Diagnosis not present

## 2020-06-19 DIAGNOSIS — E119 Type 2 diabetes mellitus without complications: Secondary | ICD-10-CM | POA: Diagnosis not present

## 2020-06-19 DIAGNOSIS — E2749 Other adrenocortical insufficiency: Secondary | ICD-10-CM | POA: Diagnosis not present

## 2020-06-19 DIAGNOSIS — R635 Abnormal weight gain: Secondary | ICD-10-CM | POA: Diagnosis not present

## 2020-06-23 LAB — HEMOGLOBIN A1C
Hemoglobin A1C: 6.3
Hemoglobin A1C: 6.6

## 2020-06-27 ENCOUNTER — Other Ambulatory Visit: Payer: Self-pay | Admitting: Internal Medicine

## 2020-06-30 ENCOUNTER — Other Ambulatory Visit: Payer: Self-pay

## 2020-06-30 ENCOUNTER — Other Ambulatory Visit: Payer: Self-pay | Admitting: Obstetrics and Gynecology

## 2020-06-30 NOTE — Patient Instructions (Signed)
Hi Ms. Darrow-thank you for speaking with me today.  Ms. Kun was given information about Medicaid Managed Care team care coordination services as a part of their [place] Medicaid benefit. [name] verbally consented to engagement with the [facility] Managed Care team.   For questions related to your [facility], please call: [phone] or visit the homepage here: [email or web address]  If you would like to schedule transportation through your [facility], please call the following number at least 2 days in advance of your appointment: [phone].   Ms. Plocher - following are the goals we discussed in your visit today:  Goals Addressed            This Visit's Progress   . Protect My Health       Timeframe:  Long-Range Goal Priority:  Medium Start Date:      05/07/20                       Expected End Date:      08/05/20                 Follow Up Date 07/31/20   - schedule appointment for vaccines needed due to my age or health - schedule recommended health tests (blood work, mammogram, colonoscopy, pap test) - schedule and keep appointment for annual check-up    Why is this important?    Screening tests can find diseases early when they are easier to treat.   Your doctor or nurse will talk with you about which tests are important for you.   Getting shots for common diseases like the flu and shingles will help prevent them.     Update 07-07-20:  Patient has appointment with PCP 07/07/20.       Patient verbalizes understanding of instructions provided today.   The Managed Medicaid care management team will reach out to the patient again over the next 30 days.  The patient has been provided with contact information for the Managed Medicaid care management team and has been advised to call with any health related questions or concerns.   Jeramine Delis  G., RN  Following is a copy of your plan of care:    Patient Care Plan: General Plan of Care (Adult)    Problem Identified: Health Promotion or Disease Self-Management (General Plan of Care)     Long-Range Goal: Self-Management Plan Developed   Start Date: 05/07/2020  Expected End Date: 08/05/2020  Recent Progress: Not on track  Priority: High  Note:   Current Barriers:  . Chronic Disease Management support and education needs. . Patient helping to provide care to brother. This causes patient a lot of stress and anxiety.  She has recently started taking Wellbutrin which she states is helping.  Patient declines any therapy services at this time. Marland Kitchen Update 07/07/2020: Patient's brother died 06/22/2020.  Patient is considering grief counseling with Hospice, declines SW referral at this time.  Nurse Case Manager Clinical Goal(s):  Marland Kitchen Over the next 30 days, patient will attend all scheduled medical appointments. . Over the next 30 days, patient will work with CM team pharmacist to review medications. Marland Kitchen Update 06/02/20:  Patient met with Pharmacist 05/11/20 and has follow up scheduled. Marland Kitchen Update 07/07/2020:  Patient continues to work with Pharmacist.  Interventions:  . Inter-disciplinary care team collaboration (see longitudinal plan of care) . Evaluation of current treatment plan and patient's adherence to plan as  established by provider. . Reviewed medications with patient. Nash Dimmer with pharmacy regarding medications. . Discussed plans with patient for ongoing care management follow up and provided patient with direct contact information for care management team . Reviewed scheduled/upcoming provider appointments. Marland Kitchen Update 06/30/20:  Patient has appointment with PCP 07/07/20. Marland Kitchen Pharmacy referral for medication review-completed.  Patient Goals/Self-Care Activities Over the next 30 days, patient will:  -Attends all scheduled provider appointments Calls pharmacy for medication refills Calls provider  office for new concerns or questions  Follow Up Plan: The Managed Medicaid care management team will reach out to the patient again over the next 30 days.  The patient has been provided with contact information for the Managed Medicaid care management team and has been advised to call with any health related questions or concerns.

## 2020-06-30 NOTE — Patient Outreach (Signed)
Medicaid Managed Care   Nurse Care Manager Note  06/30/2020 Name:  [name] [name] MRN:  [id number] DOB:  [birth year]  [name] [name] is an 60 y.o. year old female who is a primary patient of [name], MD.  The [facility] Managed Care Coordination team was consulted for assistance with:    chronic healthcare  Management.  [name] was given information about Medicaid Managed Care Coordination team services today. [name] [name] agreed to services and verbal consent obtained.  Engaged with patient by telephone for follow up visit in response to provider referral for case management and/or care coordination services.   Assessments/Interventions:  Review of past medical history, allergies, medications, health status, including review of consultants reports, laboratory and other test data, was performed as part of comprehensive evaluation and provision of chronic care management services.  SDOH (Social Determinants of Health) assessments and interventions performed:   Care Plan  Allergies  Allergen Reactions  . Acetaminophen Other (See Comments)    Inflamed liver, hospitalized REACTION: Had liver problems, was hospitalized Inflamed liver, hospitalized Other reaction(s): liver demage Other reaction(s): liver demage  . Morphine Hives and Rash    REACTION: rash  . Triamterene Hives  . Aspirin-Caffeine     Other reaction(s): liver demage  . Celexa [Citalopram]     Other reaction(s): INCREASED ANXIETY  . Dyazide [Hydrochlorothiazide W-Triamterene] Hives  . Empagliflozin     Other reaction(s): stomach ache, diarrhea  . Emtricitabine-Tenofovir Df     Other reaction(s): rash  . Lisinopril     Cough   . Metformin Hcl Er     Other reaction(s): upset stomach  . Morphine Sulfate     Other reaction(s): shortness of breath and hives  . Topamax [Topiramate]   . Triamterene-Hctz     Other reaction(s): rash  . Losartan Rash    Pt had rash, worsening dizziness, and nausea after starting  losartan, which improved after stopping losartan    Medications Reviewed Today    Reviewed by Gayla Medicus, RN (Registered Nurse) on 06/30/20 at 1120  Med List Status: <None>  Medication Order Taking? Sig Documenting Provider Last Dose Status Informant  amLODipine (NORVASC) 10 MG tablet [id number] No TAKE ONE TABLET BY MOUTH DAILY [name], MD Taking Active   atorvastatin (LIPITOR) 10 MG tablet [id number] No TAKE HALF TABLET BY MOUTH DAILY Katsadouros, Vasilios, MD Taking Active   bictegravir-emtricitabine-tenofovir AF (BIKTARVY) 50-200-25 MG TABS tablet [id number] No Take 1 tablet by mouth daily. [place] Callas, NP Taking Active   budesonide [place]) 180 MCG/ACT inhaler [id number] No 2 inhalations 2 times per day. Rinse, gargle, and spit after use. Kozlow, Donnamarie Poag, MD Taking Active   buPROPion (WELLBUTRIN XL) 150 MG 24 hr tablet [id number] No Take one tablet in the morning [name], MD Taking Active            Med Note Bonnita Levan May 11, 2020 11:34 AM) Making her dizzy, will do trial at HS  cetirizine (ZYRTEC) 10 MG tablet [id number] No 1 tablet daily Kozlow, Donnamarie Poag, MD Taking Active   cholecalciferol (VITAMIN D) 25 MCG (1000 UNIT) tablet [id number] No TAKE ONE TABLET BY MOUTH DAILY Katsadouros, Vasilios, MD Taking Active   diclofenac Sodium (VOLTAREN) 1 % GEL [id number] No APPLY FOUR GRAMS TOPICALLY FOUR TIMES A Worthy Keeler, MD Taking Active   Elastic Bandages & Supports (WRIST/THUMB SPLINT/RIGHT MED) MISC [id number] No Wear on R thumb/wrist. Ladona Horns, MD Taking Active  famotidine (PEPCID) 40 MG tablet [id number] No TAKE ONE TABLET BY MOUTH AT BEDTIME Kozlow, Donnamarie Poag, MD Taking Active   gabapentin (NEURONTIN) 100 MG capsule [id number] No Take 1 capsule (100 mg total) by mouth 2 (two) times daily. Kozlow, Donnamarie Poag, MD Taking Active   hydrocortisone (CORTEF) 10 MG tablet [id number] No Take 10 mg by mouth as directed. Pt takes 10 mg in the am and 5  mg at 11 am and 3 pm [provider] Taking Active            Med Note Bonnita Levan May 11, 2020 11:25 AM) Only taking BID, morning and noon. Taking in evening makes her gain too much weight  liraglutide (VICTOZA) 18 MG/3ML SOPN [id number] No Inject into the skin. [provider] Taking Active   metoprolol tartrate (LOPRESSOR) 25 MG tablet [id number]  TAKE ONE TABLET BY MOUTH TWICE A Harlow Mares, MD  Active   montelukast (SINGULAIR) 10 MG tablet [id number] No TAKE ONE TABLET BY MOUTH AT BEDTIME Kozlow, Donnamarie Poag, MD Taking Active   nitroGLYCERIN (NITROSTAT) 0.4 MG SL tablet [id number] No Place 1 tablet (0.4 mg total) under the tongue every 5 (five) minutes as needed for chest pain. Isaiah Serge, NP Taking Active   nortriptyline (PAMELOR) 10 MG capsule [id number] No Take 2 capsules (20 mg total) by mouth at bedtime. Suzzanne Cloud, NP Taking Active   pantoprazole (PROTONIX) 40 MG tablet [id number] No Take 1 tablet (40 mg total) by mouth 2 (two) times daily. Riesa Pope, MD Taking Active   PROAIR HFA 108 [phone] Base) MCG/ACT inhaler [id number] No INHALE TWO PUFFS BY MOUTH EVERY FOUR TO SIX HOURS AS NEEDED FOR COUGH OR WHEEZE Kozlow, Donnamarie Poag, MD Taking Active   promethazine (PHENERGAN) 25 MG tablet [id number] No Take 1 tablet (25 mg total) by mouth every 6 (six) hours as needed for nausea or vomiting. [name], DO Taking Active   sodium chloride (OCEAN) 0.65 % SOLN nasal spray [id number] No PLACE 1 SPRAY INTO BOTH NOSTRILS AS NEEDED FOR CONGESTION. [name], DO Taking Active   traMADol (ULTRAM) 50 MG tablet [id number] No Take 1 tablet (50 mg total) by mouth every 6 (six) hours as needed. Pete Pelt, PA-C Taking Active   triamcinolone (NASACORT) 55 MCG/ACT AERO nasal inhaler [id number] No 1 spray each nostril 2 times per day Jiles Prows, MD Taking Active           Patient Active Problem List   Diagnosis Date Noted  . Grief  reaction 06/08/2020  . Arm pain 02/10/2020  . Dizziness on standing 12/27/2019  . Generalized anxiety disorder with panic attacks 06/20/2019  . 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 Chronic migraine w/o aura w/o status migrainosus, not intractable 08/27/2018  . Disorder of left eustachian tube 05/19/2017  . Vertigo of central origin 01/23/2017  . Central perforation of tympanic membrane of left ear 12/09/2016  . Eustachian tube dysfunction, bilateral 06/15/2016  . Ear pain, right 06/15/2016  . 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 BPPV (benign paroxysmal positional vertigo) 10/02/2015  . Liver fibrosis ([place]) 12/04/2014  . Pituitary microadenoma ([place]) 08/08/2014  . 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 Vitamin D deficiency 06/29/2014  . Secondary adrenal insufficiency ([place]) 06/29/2014  . History of tympanostomy tube placement 02/07/2014  . Refusal of blood transfusions as patient is Jehovah's Witness 11/18/2013  . Hepatitis C virus infection cured  after antiviral drug therapy 03/16/2012  . Health care maintenance 12/15/2011  . Opioid dependence ([place]) 10/05/2010  . HTN (hypertension) 10/05/2010  . Hyperlipidemia associated with type 2 diabetes mellitus ([place]) 10/23/2008  . HIV disease ([place]) 05/09/2006  . Depression 05/09/2006  . Allergic rhinitis 05/09/2006  . GERD 05/09/2006    Conditions to be addressed/monitored per PCP order:  chronic healthcare management, HIV, HTN, DM2, asthma, H/A.  Care Plan : General Plan of Care (Adult)  Updates made by Gayla Medicus, RN since 2020/07/22 12:00 AM    Problem: Health Promotion or Disease Self-Management (General Plan of Care)     Long-Range Goal: Self-Management Plan Developed   Start Date: 05/07/2020  Expected End Date: 08/05/2020  Recent Progress: Not on track  Priority: High  Note:   Current Barriers:  . Chronic Disease Management support and education needs. . Patient  helping to provide care to brother. This causes patient a lot of stress and anxiety.  She has recently started taking Wellbutrin which she states is helping.  Patient declines any therapy services at this time. Marland Kitchen Update Jul 22, 2020: Patient's brother died 07-07-20.  Patient is considering grief counseling with Hospice, declines SW referral at this time.  Nurse Case Manager Clinical Goal(s):  Marland Kitchen Over the next 30 days, patient will attend all scheduled medical appointments. . Over the next 30 days, patient will work with CM team pharmacist to review medications. Marland Kitchen Update 06/02/20:  Patient met with Pharmacist 05/11/20 and has follow up scheduled. Marland Kitchen Update 07-22-2020:  Patient continues to work with Pharmacist.  Interventions:  . Inter-disciplinary care team collaboration (see longitudinal plan of care) . Evaluation of current treatment plan and patient's adherence to plan as established by provider. . Reviewed medications with patient. Nash Dimmer with pharmacy regarding medications. . Discussed plans with patient for ongoing care management follow up and provided patient with direct contact information for care management team . Reviewed scheduled/upcoming provider appointments. Marland Kitchen Update 22-Jul-2020:  Patient has appointment with PCP 07/07/20. Marland Kitchen Pharmacy referral for medication review-completed.  Patient Goals/Self-Care Activities Over the next 30 days, patient will:  -Attends all scheduled provider appointments Calls pharmacy for medication refills Calls provider office for new concerns or questions  Follow Up Plan: The Managed Medicaid care management team will reach out to the patient again over the next 30 days.  The patient has been provided with contact information for the Managed Medicaid care management team and has been advised to call with any health related questions or concerns.     Follow Up:  Patient agrees to Care Plan and Follow-up.  Plan: The Managed Medicaid care management team will  reach out to the patient again over the next 30 days. and The patient has been provided with contact information for the Managed Medicaid care management team and has been advised to call with any health related questions or concerns.  Date/time of next scheduled RN care management/care coordination outreach:  07/31/20 at 1030.

## 2020-07-03 ENCOUNTER — Other Ambulatory Visit: Payer: Self-pay

## 2020-07-03 ENCOUNTER — Encounter: Payer: Medicaid Other | Attending: Physical Medicine & Rehabilitation | Admitting: Physical Medicine & Rehabilitation

## 2020-07-03 ENCOUNTER — Encounter: Payer: Self-pay | Admitting: Physical Medicine & Rehabilitation

## 2020-07-03 VITALS — BP 133/86 | HR 94 | Temp 98.1°F | Ht 61.0 in | Wt 177.4 lb

## 2020-07-03 DIAGNOSIS — M533 Sacrococcygeal disorders, not elsewhere classified: Secondary | ICD-10-CM | POA: Insufficient documentation

## 2020-07-03 NOTE — Progress Notes (Signed)
  PROCEDURE RECORD  Physical Medicine and Rehabilitation   Name: [name] [name] DOB:Dec 28, 1960 MRN: [id number]  Date:07/03/2020  Physician: Alysia Penna, MD    Nurse/CMA: Geryl Rankins CMA  Allergies:  Allergies  Allergen Reactions  . Acetaminophen Other (See Comments)    Inflamed liver, hospitalized REACTION: Had liver problems, was hospitalized Inflamed liver, hospitalized Other reaction(s): liver demage Other reaction(s): liver demage Other reaction(s): liver demage Other reaction(s): liver demage  . Morphine Hives and Rash    REACTION: rash  . Triamterene Hives  . Aspirin-Caffeine     Other reaction(s): liver demage Other reaction(s): liver demage  . Citalopram     Other reaction(s): INCREASED ANXIETY Other reaction(s): INCREASED ANXIETY  . Dyazide [Hydrochlorothiazide W-Triamterene] Hives  . Empagliflozin     Other reaction(s): stomach ache, diarrhea Other reaction(s): stomach ache, diarrhea  . Emtricitabine-Tenofovir Df     Other reaction(s): rash Other reaction(s): rash  . Lisinopril     Cough   . Metformin Hcl Er     Other reaction(s): upset stomach Other reaction(s): upset stomach  . Morphine Sulfate     Other reaction(s): shortness of breath and hives Other reaction(s): shortness of breath and hives  . Topamax [Topiramate]   . Triamterene-Hctz     Other reaction(s): rash Other reaction(s): rash  . Losartan Rash    Pt had rash, worsening dizziness, and nausea after starting losartan, which improved after stopping losartan    Consent Signed: Yes.    Is patient diabetic? Yes.    CBG today? 112  Pregnant: No. LMP: No LMP recorded. Patient is postmenopausal. (age 59-55)  Anticoagulants: no Anti-inflammatory: no Antibiotics: no  Procedure: AK for Bilateral sacroiliac steroid injection Position: Prone Start Time: 11:15am    End Time: 11:22am Fluoro Time: 22s   RN/CMA [employer], CMA    Time 10:52 am 11:25am    BP 133/86  133/85    Pulse 94 97    Respirations 16 16    O2 Sat 97 96    S/S 6 6    Pain Level 8/10 7/10     D/C home with Kendal Hymen, [name] & O X 3, D/C instructions reviewed, and sits independently.

## 2020-07-03 NOTE — Patient Instructions (Signed)
Sacroiliac injection was performed today. A combination of numbing medicine (lidocaine) plus a cortisone medicine (betamethasone) was injected. The injection was done under x-ray guidance. This procedure has been performed to help reduce low back and buttocks pain as well as potentially hip pain. The duration of this injection is variable lasting from hours to  Months. It may repeated if needed. 

## 2020-07-03 NOTE — Progress Notes (Signed)
Bilateral  sacroiliac injection under fluoroscopic guidance  Indication: Bilateral Low back and buttocks pain not relieved by medication management and other conservative care.  History of HIV well managed, 11/13/2019 CD4 [id number]   Informed consent was obtained after describing risks and benefits of the procedure with the patient, this includes bleeding, bruising, infection, paralysis and medication side effects. The patient wishes to proceed and has given written consent. The patient was placed in a prone position. The lumbar and sacral area was marked and prepped with Betadine. A 25-gauge 1-1/2 inch needle was inserted into the skin and subcutaneous tissue and 1 mL of 1% lidocaine was injected. Then a 25-gauge 3 inch spinal needle was inserted under fluoroscopic guidance into the left sacroiliac joint. AP and lateral images were utilized. Isovue 200x0.5 mL under live fluoroscopy demonstrated no intravascular uptake. Then a solution containing one ML of 6 mg per mLbetamethasone and 2 ML of 2% lidocaine MPF was injected x1.5 mL.Same procedure performed on the right with same technique and equipment.  Patient tolerated the procedure well. Post procedure instructions were given. Please see post procedure form.

## 2020-07-07 ENCOUNTER — Ambulatory Visit: Payer: Medicaid Other | Admitting: Student

## 2020-07-07 ENCOUNTER — Other Ambulatory Visit: Payer: Self-pay

## 2020-07-07 ENCOUNTER — Encounter: Payer: Self-pay | Admitting: Student

## 2020-07-07 VITALS — BP 134/85 | HR 87 | Temp 98.2°F | Ht 61.0 in | Wt 178.0 lb

## 2020-07-07 DIAGNOSIS — M7989 Other specified soft tissue disorders: Secondary | ICD-10-CM

## 2020-07-07 DIAGNOSIS — F4321 Adjustment disorder with depressed mood: Secondary | ICD-10-CM | POA: Diagnosis not present

## 2020-07-07 DIAGNOSIS — Z0000 Encounter for general adult medical examination without abnormal findings: Secondary | ICD-10-CM

## 2020-07-07 DIAGNOSIS — I10 Essential (primary) hypertension: Secondary | ICD-10-CM | POA: Diagnosis not present

## 2020-07-07 DIAGNOSIS — Z23 Encounter for immunization: Secondary | ICD-10-CM | POA: Diagnosis not present

## 2020-07-07 DIAGNOSIS — F419 Anxiety disorder, unspecified: Secondary | ICD-10-CM

## 2020-07-07 DIAGNOSIS — K219 Gastro-esophageal reflux disease without esophagitis: Secondary | ICD-10-CM | POA: Diagnosis not present

## 2020-07-07 MED ORDER — PANTOPRAZOLE SODIUM 40 MG PO TBEC: 40.0000 mg | DELAYED_RELEASE_TABLET | Freq: Two times a day (BID) | ORAL | 2 refills | Status: DC

## 2020-07-07 MED ORDER — METOPROLOL TARTRATE 25 MG PO TABS: 25.0000 mg | ORAL_TABLET | Freq: Two times a day (BID) | ORAL | 0 refills | Status: DC

## 2020-07-07 MED ORDER — AMLODIPINE BESYLATE 10 MG PO TABS: 10.0000 mg | ORAL_TABLET | Freq: Every day | ORAL | 1 refills | Status: DC

## 2020-07-07 MED ORDER — GABAPENTIN 100 MG PO CAPS: 100.0000 mg | ORAL_CAPSULE | Freq: Two times a day (BID) | ORAL | 5 refills | Status: DC

## 2020-07-07 NOTE — Patient Instructions (Signed)
Ms. Knapper,  It was a pleasure seeing you today!  Today we discussed the swelling in your left leg. Make sure to wear your compression socks to help decrease the swelling. If it doesn't help decrease the swelling, you can call your cardiologist to set up an appointment.  I have also sent in your prescriptions to the pharmacy.  We look forward to seeing you next time. Please call our clinic at [phone] if you have any questions or concerns. The best time to call is Monday-Friday from 9am-4pm, but there is someone available 24/7 at the same number. If you need medication refills, please notify your pharmacy one week in advance and they will send [place] a request.  Thank you for letting us take part in your care. Wishing you the best!  Thank you, Dr. [name], MD

## 2020-07-07 NOTE — Progress Notes (Signed)
   CC: routine follow-up, unilateral leg swelling  HPI:  [name] [name] is a 60 y.o. with medical history as below presenting for routine follow-up and evaluation of intermittent leg swelling.   Please see problem-based list for further details, assessments, and plans.  Past Medical History:  Diagnosis Date  . Allergic rhinitis 05/09/2006  . Allergy   . Anxiety   . Arthritis   . Asthma   . CHF (congestive heart failure) ([place])   . Chronic back pain   . Diabetes mellitus without complication ([place])   . GERD (gastroesophageal reflux disease)   . Heart murmur    as a child  . 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 s/p 12 week course of simeprevir and sofosbuvir between October 2014 and January 2015 with resolution.  Marland Kitchen History of shingles   . HIV infection ([place])    1994  . Hyperlipidemia    no meds taken now  . Hypertension   . Migraine   . Pituitary microadenoma ([place]) 08/08/2014  . Prediabetes   . Refusal of blood transfusions as patient is Jehovah's Witness   . Secondary adrenal insufficiency ([place]) 06/29/2014  . Urticaria    Review of Systems:  As per HPI  Physical Exam:  Vitals:   07/07/20 1518  BP: 134/85  Pulse: 87  Temp: 98.2 F (36.8 C)  TempSrc: Oral  SpO2: 99%  Weight: 178 lb (80.7 kg)  Height: 5\' 1"  (1.549 m)   General: Pleasant, sitting in room in no acute distress CV: Regular rate, rhythm. No m/r/g. Distal pulses 2+ bilaterally. Pulm: Normal WOB. Clear to auscultation bilaterally. MSK: Legs appear symmetrical. No erythema, warmth, or pain with passive flexion bilaterally. No pitting edema bilaterally. No varicose veins or chronic venous stasis dermatitis visible bilaterally.  Assessment & Plan:   See Encounters Tab for problem based charting.  Patient discussed with Dr. Rebeca Alert

## 2020-07-08 ENCOUNTER — Other Ambulatory Visit: Payer: Self-pay | Admitting: Allergy and Immunology

## 2020-07-08 ENCOUNTER — Other Ambulatory Visit: Payer: Self-pay | Admitting: Internal Medicine

## 2020-07-09 DIAGNOSIS — M7989 Other specified soft tissue disorders: Secondary | ICD-10-CM | POA: Insufficient documentation

## 2020-07-09 NOTE — Assessment & Plan Note (Signed)
Patient reports intermittent left leg swelling over the past few weeks. States that often the swelling will occur after being on her feet during the day, especially in the evenings. Mentions the swelling is usually gone in the morning after she sleeps. [name] [name] says her right leg has not been affected. Denies injury, chest pain, dyspnea, orthopnea, PND.  A/P: No clinical signs of CHF on exam. Legs appear symmetrical, no erythema, warmth, or pain with passive flexion. Most likely component of venous insufficiency. Discussed with patient to wear compression stockings and keep feet elevated. Encouraged her to return to clinic or make an appointment with her cardiologist if both feet swell or if the compression stockings do not help relief her symptoms. - Compression stockings - RTC if swelling does not improve

## 2020-07-09 NOTE — Assessment & Plan Note (Signed)
BP Readings from Last 3 Encounters:  07/07/20 134/85  07/03/20 133/86  06/12/20 130/82   BP stable. Ms. Lebon states she takes her blood pressure at home with systolic BP [phone]. If patient continues to have leg swelling, can consider switching amlodipine. - Continue metoprolol 25mg  BID, amlodipine 10mg  qd

## 2020-07-09 NOTE — Assessment & Plan Note (Signed)
Patient reports her brother recently passed away. 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 Patient says she is taking things day by day, but slowly is improving. She does have a support system around her to help. Discussed healthy coping mechanisms, will continue to monitor.

## 2020-07-16 ENCOUNTER — Telehealth: Payer: Self-pay | Admitting: Cardiovascular Disease

## 2020-07-16 NOTE — Telephone Encounter (Signed)
Called patient back about her message. Patient stated she has SOB at times, that usually comes after having palpitations. Patient complained of BLE swelling as well, that is worse on the left. Made patient an appointment to see Dr. Johnsie Cancel tomorrow to be evaluated.

## 2020-07-16 NOTE — Telephone Encounter (Signed)
Pt c/o Shortness Of Breath: STAT if SOB developed within the last 24 hours or pt is noticeably SOB on the phone  1. Are you currently SOB (can you hear that pt is SOB on the phone)? No   2. How long have you been experiencing SOB?  Past couple of weeks, per patient  3. Are you SOB when sitting or when up moving around?  When up and moving around  4. Are you currently experiencing any other symptoms?  No

## 2020-07-16 NOTE — Progress Notes (Unsigned)
Date:  07/17/2020   ID:  [name] [name], DOB 05/24/1960, MRN [id number] The patient was identified using 2 identifiers.   PCP:  [name], MD  Cardiologist:  Jenkins Rouge, MD   Evaluation Performed:  Follow-Up Visit  Chief Complaint:  Chest pain, hx of and continues   History of Present Illness:    [name] [name] is a 60 y.o. female with history of Rx Hepatitis C, HIV followed by ID Dr Johnnye Sima. 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 History of GERD, CKD and pancreatitis.  Jehovah's witness no blood products. History of atypical chest pain with normal stress echo in 2016 and normal Lexiscan Myovue in 2018 GERD with difficulty laying flat due to reflux.  She continues to smoke   Called office 3/24 with complaints of LE edema, palpitations and some dyspnea Seen by primary Dr Collene Gobble 07/07/20 complained of intermittent left leg edema he commented On possibly changing Norvasc. Worse when on feet all day No right leg issues Resolved in  Am when she awakes Indicated ? Venous insufficiency and to wear compression hose Brother died Recently and she was one of his primary care givers   She has gained a lot of weight    Past Medical History:  Diagnosis Date  . Allergic rhinitis 05/09/2006  . Allergy   . Anxiety   . Arthritis   . Asthma   . CHF (congestive heart failure) ([place])   . Chronic back pain   . Diabetes mellitus without complication ([place])   . GERD (gastroesophageal reflux disease)   . Heart murmur    as a child  . 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 s/p 12 week course of simeprevir and sofosbuvir between October 2014 and January 2015 with resolution.  Marland Kitchen History of shingles   . HIV infection ([place])    1994  . Hyperlipidemia    no meds taken now  . Hypertension   . Migraine   . Pituitary microadenoma ([place]) 08/08/2014  . Prediabetes   . Refusal of blood transfusions as patient is Jehovah's Witness   . Secondary adrenal  insufficiency ([place]) 06/29/2014  . Urticaria    Past Surgical History:  Procedure Laterality Date  . BUNIONECTOMY     b/l  . COLECTOMY     2003 for diverticulitis, had colostomy bag and then reversed  . COLONOSCOPY    . HAND SURGERY    . NASAL SINUS SURGERY    . SHOULDER SURGERY     left  . TONSILLECTOMY       Current Meds  Medication Sig  . amLODipine (NORVASC) 10 MG tablet Take 1 tablet (10 mg total) by mouth daily.  Marland Kitchen atorvastatin (LIPITOR) 10 MG tablet TAKE HALF TABLET BY MOUTH DAILY  . bictegravir-emtricitabine-tenofovir AF (BIKTARVY) 50-200-25 MG TABS tablet Take 1 tablet by mouth daily.  . budesonide (PULMICORT FLEXHALER) 180 MCG/ACT inhaler 2 inhalations 2 times per day. Rinse, gargle, and spit after use.  Marland Kitchen buPROPion (WELLBUTRIN XL) 150 MG 24 hr tablet Take one tablet in the morning  . cetirizine (ZYRTEC) 10 MG tablet 1 tablet daily  . cholecalciferol (VITAMIN D) 25 MCG (1000 UNIT) tablet TAKE ONE TABLET BY MOUTH DAILY  . diclofenac Sodium (VOLTAREN) 1 % GEL APPLY FOUR GRAMS TOPICALLY FOUR TIMES A DAY  . Elastic Bandages & Supports (WRIST/THUMB SPLINT/RIGHT MED) MISC Wear on R thumb/wrist.  . famotidine (PEPCID) 40 MG tablet TAKE ONE TABLET BY MOUTH  AT BEDTIME  . gabapentin (NEURONTIN) 100 MG capsule Take 1 capsule (100 mg total) by mouth 2 (two) times daily.  . hydrocortisone (CORTEF) 10 MG tablet Take 10 mg by mouth as directed. Pt takes 10 mg in the am and 5 mg at 11 am and 3 pm  . liraglutide (VICTOZA) 18 MG/3ML SOPN Inject into the skin.  . metoprolol tartrate (LOPRESSOR) 25 MG tablet Take 1 tablet (25 mg total) by mouth 2 (two) times daily.  . montelukast (SINGULAIR) 10 MG tablet TAKE ONE TABLET BY MOUTH AT BEDTIME  . nitroGLYCERIN (NITROSTAT) 0.4 MG SL tablet Place 1 tablet (0.4 mg total) under the tongue every 5 (five) minutes as needed for chest pain.  . nortriptyline (PAMELOR) 10 MG capsule Take 2 capsules (20 mg total) by mouth at bedtime.  . pantoprazole  (PROTONIX) 40 MG tablet Take 1 tablet (40 mg total) by mouth 2 (two) times daily.  . potassium chloride SA (KLOR-CON) 20 MEQ tablet Take 20 mEq by mouth daily.  Marland Kitchen PROAIR HFA 108 (90 Base) MCG/ACT inhaler INHALE TWO PUFFS BY MOUTH EVERY FOUR TO SIX HOURS AS NEEDED FOR COUGH OR WHEEZE  . promethazine (PHENERGAN) 25 MG tablet Take 1 tablet (25 mg total) by mouth every 6 (six) hours as needed for nausea or vomiting.  . sodium chloride (OCEAN) 0.65 % SOLN nasal spray PLACE 1 SPRAY INTO BOTH NOSTRILS AS NEEDED FOR CONGESTION.  Marland Kitchen triamcinolone (NASACORT) 55 MCG/ACT AERO nasal inhaler 1 spray each nostril 2 times per day     Allergies:   Acetaminophen, Morphine, Triamterene, Aspirin-caffeine, Citalopram, Dyazide [hydrochlorothiazide w-triamterene], Empagliflozin, Emtricitabine-tenofovir df, Lisinopril, Metformin hcl er, Morphine sulfate, Topamax [topiramate], Triamterene-hctz, and Losartan   Social History   Tobacco Use  . Smoking status: Former Smoker    Years: 20.00    Types: Cigarettes    Quit date: 04/26/2007    Years since quitting: 13.2  . Smokeless tobacco: Never Used  . Tobacco comment: QUIT 2009  Vaping Use  . Vaping Use: Never used  Substance Use Topics  . Alcohol use: No    Alcohol/week: 0.0 standard drinks  . Drug use: No     Family Hx: The patient's family history includes Allergic rhinitis in her sister and sister; Asthma in her sister and sister; Cancer in her maternal aunt, maternal aunt, and sister; Colon polyps in her brother; Diabetes in her father and mother; Heart disease in her father and mother; Hepatitis in her sister; Renal Disease in her brother; Stroke in her father, mother, and another family member. There is no history of Colon cancer, Esophageal cancer, Stomach cancer, Rectal cancer, Angioedema, Eczema, or Urticaria.  ROS:   Please see the history of present illness.    General:no colds or fevers, no weight changes Skin:no rashes or ulcers HEENT:no blurred  vision, no congestion CV:see HPI PUL:see HPI GI:no diarrhea constipation or melena, no indigestion GU:no hematuria, no dysuria MS:no joint pain, no claudication Neuro:no syncope, no lightheadedness Endo:+ diabetes, no thyroid disease    All other systems reviewed and are negative.   Prior CV studies:   The following studies were reviewed today:  Lexiscan Myoview 10/10/2016  Nuclear stress EF: 63%.  There was no ST segment deviation noted during stress.  The study is normal.  The left ventricular ejection fraction is normal (55-65%).  1. EF 63%, normal wall motion.  2. No evidence for ischemia or infarction on perfusion images.   Normal study.   Stress Echo 10/21/14 Study Conclusions -  Stress ECG conclusions: The stress ECG was normal. - Baseline: LV global systolic function was normal. The estimated LV ejection fraction was 60%. Normal wall motion; no LV regional wall motion abnormalities. - Peak stress: LV global systolic function was vigorous. Normal wall motion; no LV regional wall motion abnormalities. - Impressions: Normal increase in thickening and contractility in all segments. This is a normal stress echo study.  Impressions: - Normal increase in thickening and contractility in all segments. This is a normal stress echo study.  05/22/18 Normal nuc   Labs/Other Tests and Data Reviewed:    EKG: 09/04/17 SR rate 83 nonspecific ST changes 07/17/2020 NSR rate 84 normal   Recent Labs: 11/13/2019: ALT 18; Hemoglobin 14.0; Platelets 253 05/21/2020: BUN 13; Creat 0.91; Potassium 4.2; Sodium 139   Recent Lipid Panel Lab Results  Component Value Date/Time   CHOL 159 11/13/2019 01:51 PM   TRIG 286 (H) 11/13/2019 01:51 PM   HDL 40 (L) 11/13/2019 01:51 PM   CHOLHDL 4.0 11/13/2019 01:51 PM   LDLCALC 81 11/13/2019 01:51 PM    Wt Readings from Last 3 Encounters:  07/17/20 80.7 kg  07/07/20 80.7 kg  07/03/20 80.5 kg     Risk  Assessment/Calculations:      Objective:    Vital Signs:  BP (!) 142/72   Pulse 84   Ht 5\' 1"  (1.549 m)   Wt 80.7 kg   SpO2 97%   BMI 33.63 kg/m    Affect appropriate Cushingoid black female  HEENT: normal Neck supple with no adenopathy JVP normal no bruits no thyromegaly Lungs clear with no wheezing and good diaphragmatic motion Heart:  S1/S2 no murmur, no rub, gallop or click PMI normal Abdomen: benighn, BS positve, no tenderness, no AAA no bruit.  No HSM or HJR Distal pulses intact with no bruits Edema :  Trace LLE Neuro non-focal Skin warm and dry No muscular weakness  ASSESSMENT & PLAN:    1. Atypical chest pain:  Normal ECG unable to walk well f/u Lexiscan myovue  2. HTN controlled continue meds consider changing Norvasc due to edema  3. HLD controlled continue statin 4. Hx HIV undetectable  5. Dyspnea:  Smokes THC no cigarettes check CXR and echo regarding LV/RV function   6. Edema:  She is on norvasc and steroids both of which can retain fluid doubt CHF She has trace LLE edema consider adding low dose diuretic per primary  7. DM:  Discussed low carb diet.  Target hemoglobin A1c is 6.5 or less.  Continue current medications.    Medication Adjustments/Labs and Tests Ordered: Current medicines are reviewed at length with the patient today.  Concerns regarding medicines are outlined above.   Tests Ordered: CXR Echo Lexiscan Myovue   Medication Changes: No orders of the defined types were placed in this encounter.   Follow Up: PRN pending tests   Signed, Jenkins Rouge, MD  07/17/2020 2:23 PM    [facility]

## 2020-07-17 ENCOUNTER — Ambulatory Visit
Admission: RE | Admit: 2020-07-17 | Discharge: 2020-07-17 | Disposition: A | Discharge status: Home / Self Care | Payer: Medicaid Other | Source: Ambulatory Visit | Attending: Cardiovascular Disease | Admitting: Cardiovascular Disease

## 2020-07-17 ENCOUNTER — Ambulatory Visit (INDEPENDENT_AMBULATORY_CARE_PROVIDER_SITE_OTHER): Payer: Medicaid Other | Admitting: Cardiovascular Disease

## 2020-07-17 ENCOUNTER — Other Ambulatory Visit: Payer: Self-pay

## 2020-07-17 ENCOUNTER — Encounter: Payer: Self-pay | Admitting: Cardiovascular Disease

## 2020-07-17 VITALS — BP 142/72 | HR 84 | Ht 61.0 in | Wt 178.0 lb

## 2020-07-17 DIAGNOSIS — R0600 Dyspnea, unspecified: Secondary | ICD-10-CM | POA: Diagnosis not present

## 2020-07-17 DIAGNOSIS — R079 Chest pain, unspecified: Secondary | ICD-10-CM

## 2020-07-17 NOTE — Patient Instructions (Addendum)
Medication Instructions:  *If you need a refill on your cardiac medications before your next appointment, please call your pharmacy*  Lab Work: If you have labs (blood work) drawn today and your tests are completely normal, you will receive your results only by: Marland Kitchen MyChart Message (if you have MyChart) OR . A paper copy in the mail If you have any lab test that is abnormal or we need to change your treatment, we will call you to review the results.  Testing/Procedures: Your physician has requested that you have an echocardiogram. Echocardiography is a painless test that uses sound waves to create images of your heart. It provides your doctor with information about the size and shape of your heart and how well your heart's chambers and valves are working. This procedure takes approximately one hour. There are no restrictions for this procedure.  Your physician has requested that you have a lexiscan myoview. For further information please visit [email or web address]. Please follow instruction sheet, as given.  A chest x-ray takes a picture of the organs and structures inside the chest, including the heart, lungs, and blood vessels. This test can show several things, including, whether the heart is enlarges; whether fluid is building up in the lungs; and whether pacemaker / defibrillator leads are still in place. Go to [address] They are done on the first floor.  Follow-Up: At [facility], you and your health needs are our priority.  As part of our continuing mission to provide you with exceptional heart care, we have created designated Provider Care Teams.  These Care Teams include your primary Cardiologist (physician) and Advanced Practice Providers (APPs -  Physician Assistants and Nurse Practitioners) who all work together to provide you with the care you need, when you need it.  We recommend signing up for the patient portal called "MyChart".  Sign up information is provided on this  After Visit Summary.  MyChart is used to connect with patients for Virtual Visits (Telemedicine).  Patients are able to view lab/test results, encounter notes, upcoming appointments, etc.  Non-urgent messages can be sent to your provider as well.   To learn more about what you can do with MyChart, go to [email or web address].    Your next appointment:   As needed  The format for your next appointment:   In Person  Provider:   You may see Jenkins Rouge, MD or one of the following Advanced Practice Providers on your designated Care Team:    Kathyrn Drown, NP

## 2020-07-22 ENCOUNTER — Encounter (HOSPITAL_COMMUNITY): Payer: Self-pay | Admitting: *Deleted

## 2020-07-22 ENCOUNTER — Telehealth (HOSPITAL_COMMUNITY): Payer: Self-pay | Admitting: *Deleted

## 2020-07-22 ENCOUNTER — Other Ambulatory Visit: Payer: Self-pay

## 2020-07-22 DIAGNOSIS — R0600 Dyspnea, unspecified: Secondary | ICD-10-CM

## 2020-07-22 DIAGNOSIS — R079 Chest pain, unspecified: Secondary | ICD-10-CM

## 2020-07-22 NOTE — Telephone Encounter (Signed)
Left message on voicemail per DPR in reference to upcoming appointment scheduled on 07/27/20 at 1015 with detailed instructions given per Myocardial Perfusion Study Information Sheet for the test. LM to arrive 15 minutes early, and that it is imperative to arrive on time for appointment to keep from having the test rescheduled. If you need to cancel or reschedule your appointment, please call the office within 24 hours of your appointment. Failure to do so may result in a cancellation of your appointment, and a $50 no show fee. Phone number given for call back for any questions.  mychart letter sent with instructions.Daysen Gundrum, Ranae Palms

## 2020-07-22 NOTE — Progress Notes (Signed)
Placed order for consent. Will forward to Dr. Johnsie Cancel to sign.

## 2020-07-23 ENCOUNTER — Ambulatory Visit
Admission: RE | Admit: 2020-07-23 | Discharge: 2020-07-23 | Disposition: A | Discharge status: Home / Self Care | Payer: Medicaid Other | Source: Ambulatory Visit | Attending: Internal Medicine | Admitting: Internal Medicine

## 2020-07-23 ENCOUNTER — Other Ambulatory Visit: Payer: Self-pay

## 2020-07-23 DIAGNOSIS — Z1231 Encounter for screening mammogram for malignant neoplasm of breast: Secondary | ICD-10-CM

## 2020-07-27 ENCOUNTER — Ambulatory Visit (HOSPITAL_COMMUNITY): Payer: Medicaid Other | Attending: Internal Medicine

## 2020-07-27 ENCOUNTER — Other Ambulatory Visit: Payer: Self-pay

## 2020-07-27 DIAGNOSIS — R079 Chest pain, unspecified: Secondary | ICD-10-CM | POA: Diagnosis not present

## 2020-07-27 DIAGNOSIS — R0600 Dyspnea, unspecified: Secondary | ICD-10-CM

## 2020-07-27 LAB — MYOCARDIAL PERFUSION IMAGING
LV dias vol: 52 mL (ref 46–106)
LV sys vol: 20 mL
Peak HR: 100 {beats}/min
Rest HR: 85 {beats}/min
SDS: 0
SRS: 0
SSS: 0
TID: 0.97

## 2020-07-27 MED ORDER — AMINOPHYLLINE 25 MG/ML IV SOLN
150.0000 mg | Freq: Once | INTRAVENOUS | Status: AC
  Administered 2020-07-27: 150 mg via INTRAVENOUS

## 2020-07-27 MED ORDER — TECHNETIUM TC 99M TETROFOSMIN IV KIT
30.7000 | PACK | Freq: Once | INTRAVENOUS | Status: AC | PRN
  Administered 2020-07-27: 30.7 via INTRAVENOUS
  Filled 2020-07-27: qty 31

## 2020-07-27 MED ORDER — TECHNETIUM TC 99M TETROFOSMIN IV KIT
10.1000 | PACK | Freq: Once | INTRAVENOUS | Status: AC | PRN
  Administered 2020-07-27: 10.1 via INTRAVENOUS
  Filled 2020-07-27: qty 11

## 2020-07-27 MED ORDER — REGADENOSON 0.4 MG/5ML IV SOLN
0.4000 mg | Freq: Once | INTRAVENOUS | Status: AC
  Administered 2020-07-27: 0.4 mg via INTRAVENOUS

## 2020-07-31 ENCOUNTER — Other Ambulatory Visit: Payer: Self-pay | Admitting: Allergy and Immunology

## 2020-07-31 ENCOUNTER — Other Ambulatory Visit: Payer: Self-pay | Admitting: Obstetrics and Gynecology

## 2020-07-31 DIAGNOSIS — E2749 Other adrenocortical insufficiency: Secondary | ICD-10-CM | POA: Diagnosis not present

## 2020-07-31 DIAGNOSIS — D352 Benign neoplasm of pituitary gland: Secondary | ICD-10-CM | POA: Diagnosis not present

## 2020-07-31 DIAGNOSIS — E119 Type 2 diabetes mellitus without complications: Secondary | ICD-10-CM | POA: Diagnosis not present

## 2020-07-31 MED ORDER — ALBUTEROL SULFATE HFA 108 (90 BASE) MCG/ACT IN AERS: 1.0000 | INHALATION_SPRAY | RESPIRATORY_TRACT | 0 refills | Status: DC | PRN

## 2020-07-31 NOTE — Progress Notes (Signed)
Internal Medicine Clinic Attending  Case discussed with Dr. Braswell at the time of the visit.  We reviewed the resident's history and exam and pertinent patient test results.  I agree with the assessment, diagnosis, and plan of care documented in the resident's note.  Rena Sweeden, M.D., Ph.D.  

## 2020-07-31 NOTE — Patient Instructions (Signed)
Hi Ms. Calzada, sorry I missed you today  - as a part of your Medicaid benefit, you are eligible for care management and care coordination services at no cost or copay. I was unable to reach you by phone today but would be happy to help you with your health related needs. Please feel free to call me at [phone].  A member of the Managed Medicaid care management team will reach out to you again over the next 7 days.   Aida Raider RN, BSN [place]  [facility] - Managed Medicaid High Risk [phone].

## 2020-07-31 NOTE — Telephone Encounter (Signed)
Pt would like refill for singulair and inhaler sent in to [facility]. Pt has appt for 08/25/20   Please advise.

## 2020-07-31 NOTE — Patient Outreach (Signed)
Care Coordination  07/31/2020  [name] [name] 1960/12/02 [id number]    Medicaid Managed Care   Unsuccessful Outreach Note  07/31/2020 Name: [name] [name] MRN: [id number] DOB: February 05, 1961  Referred by: [name], MD Reason for referral : High Risk Managed Medicaid (Unsuccessful telephone outreach)   An unsuccessful telephone outreach was attempted today. The patient was referred to the case management team for assistance with care management and care coordination.   Follow Up Plan: A member of the Managed Medicaid care management team will reach out to the patient again over the next 7 days.   Aida Raider RN, BSN [place]  [facility] - Managed Medicaid High Risk [phone].

## 2020-08-03 ENCOUNTER — Other Ambulatory Visit: Payer: Self-pay | Admitting: Student

## 2020-08-10 ENCOUNTER — Other Ambulatory Visit: Payer: Self-pay

## 2020-08-10 ENCOUNTER — Other Ambulatory Visit: Payer: Self-pay | Admitting: Obstetrics and Gynecology

## 2020-08-10 NOTE — Patient Outreach (Signed)
Medicaid Managed Care    Pharmacy Note  08/10/2020 Name: [name] [name] MRN: [id number] DOB: 11-Dec-1960  [name] [name] is a 60 y.o. year old female who is a primary care patient of [name] Dame, MD. [facility] Man[facility] Care Coordination team was consulted for assistance with disease management and care coordination needs.    Engaged with patient Engaged with patient by telephone for initial visit in response to referral for case management and/or care coordination services.  [name] [name] was given information about Managed Medicaid Care Coordination team services today. [name] [name] agreed to services and verbal consent obtained.   Objective:  Lab Results  Component Value Date   CREATININE 0.91 05/21/2020   CREATININE 0.92 11/13/2019   CREATININE 1.00 08/30/2018    Lab Results  Component Value Date   HGBA1C 6.3 05/21/2020       Component Value Date/Time   CHOL 159 11/13/2019 1351   TRIG 286 (H) 11/13/2019 1351   HDL 40 (L) 11/13/2019 1351   CHOLHDL 4.0 11/13/2019 1351   VLDL 44 (H) 07/12/2016 1137   LDLCALC 81 11/13/2019 1351    Other: (TSH, CBC, Vit D, etc.)  Clinical ASCVD: Yes  The 10-year ASCVD risk score [name] [name] DC Jr., et al., 2013) is: 17.5%   Values used to calculate the score:     Age: 49 years     Sex: Female     Is Non-Hispanic African American: Yes     Diabetic: Yes     Tobacco smoker: No     Systolic Blood Pressure: 937 mmHg     Is BP treated: Yes     HDL Cholesterol: 40 mg/dL     Total Cholesterol: 159 mg/dL    Other: (CHADS2VASc if Afib, PHQ9 if depression, MMRC or CAT for COPD, ACT, DEXA)  BP Readings from Last 3 Encounters:  07/17/20 (!) 142/72  07/07/20 134/85  07/03/20 133/86    Assessment/Interventions: Review of patient past medical history, allergies, medications, health status, including review of consultants reports, laboratory and other test data, was performed as part of comprehensive evaluation and provision of chronic care  management services.   HTN Checks BP daily:  123/74 on 04/25/20 BP Readings from Last 3 Encounters:  07/17/20 (!) 142/72  07/07/20 134/85  07/03/20 133/86   Metoprolol 25mg  BID Amlodipine Plan: At goal,  patient stable/ symptoms controlled   DM Lab Results  Component Value Date   HGBA1C 6.3 05/21/2020   HGBA1C 6.3 (A) 11/22/2019   HGBA1C 6.6 10/18/2019   Lab Results  Component Value Date   LDLCALC 81 11/13/2019   CREATININE 0.91 05/21/2020    Lab Results  Component Value Date   NA 139 05/21/2020   K 4.2 05/21/2020   CREATININE 0.91 05/21/2020   GFRNONAA 89 11/24/2015   GFRAA >89 11/24/2015   GLUCOSE 117 (H) 05/21/2020   Liraglutide (Due to steroids) Plan: At goal,  patient stable/ symptoms controlled   Lipids Lab Results  Component Value Date   CHOL 159 11/13/2019   CHOL 141 08/30/2018   CHOL 149 11/16/2017   Lab Results  Component Value Date   HDL 40 (L) 11/13/2019   HDL 32 (L) 08/30/2018   HDL 36 (L) 11/16/2017   Lab Results  Component Value Date   LDLCALC 81 11/13/2019   LDLCALC 79 08/30/2018   LDLCALC 76 11/16/2017   Lab Results  Component Value Date   TRIG 286 (H) 11/13/2019   TRIG 208 (H) 08/30/2018  TRIG 308 (H) 11/16/2017   Lab Results  Component Value Date   CHOLHDL 4.0 11/13/2019   CHOLHDL 4.4 08/30/2018   CHOLHDL 4.1 11/16/2017   No results found for: LDLDIRECT  Atorvastatin 10mg  (Take 0.5 tablet) Plan: At goal,  patient stable/ symptoms controlled   HIV Dx in [place] (Taking for years) Plan: At goal,  patient stable/ symptoms controlled   Mood Depression screen [name] 2/9 07/07/2020 02/07/2020 11/27/2019  Decreased Interest 0 0 0  Down, Depressed, Hopeless 0 0 0  PHQ - 2 Score 0 0 0  Altered sleeping 3 - -  Tired, decreased energy 1 - -  Change in appetite 0 - -  Feeling bad or failure about yourself  0 - -  Trouble concentrating 0 - -  Moving slowly or fidgety/restless 0 - -  Suicidal thoughts 0 - -  PHQ-9  Score 4 - -  Difficult doing work/chores Not difficult at all - -  Some recent data might be hidden    Brother is sick and causes her stress Bupropion (Making her dizzy) Jan 2022 Plan: Take Bupropion at night April 2022: Taking Bupropion in Evening helped completely! She's glad she's on it because her brother passes 06/16/20 (Same day as sister's birthday).  Migraines -Has headaches every other day  -Migraines every other month Nortriptyline Sumatriptan Plan: At goal,  patient stable/ symptoms controlled    Pain Tramadol (not taking) -Hates Pain meds Plan: At goal,  patient stable/ symptoms controlled    Itching Monteleukast Gabapentin 100mg  Cetirizine 10mg  Gabapentin Jan 2022 Plan: Patient experiencing itching at night. Day/Noon doses of steroid could be masking during the day. She will reach out to specialist immediately, she told me she has f/u soon and didn't wish me to call April 2022: Patient has no more issues with itching, newly prescribed Gabapentin from Dr. Roswell Nickel and it helps great!   Addison's Hydrocortisone -Only taking BID, not TID due to weight gain Plan: Patient will let specialist know she's not taking as directed   SDOH (Social Determinants of Health) assessments and interventions performed:    Care Plan  Allergies  Allergen Reactions  . Acetaminophen Other (See Comments)    Inflamed liver, hospitalized REACTION: Had liver problems, was hospitalized Inflamed liver, hospitalized Other reaction(s): liver demage Other reaction(s): liver demage Other reaction(s): liver demage Other reaction(s): liver demage  . Morphine Hives and Rash    REACTION: rash  . Triamterene Hives  . Aspirin-Caffeine     Other reaction(s): liver demage Other reaction(s): liver demage  . Citalopram     Other reaction(s): INCREASED ANXIETY Other reaction(s): INCREASED ANXIETY  . Dyazide [Hydrochlorothiazide W-Triamterene] Hives  . Empagliflozin     Other  reaction(s): stomach ache, diarrhea Other reaction(s): stomach ache, diarrhea  . Emtricitabine-Tenofovir Df     Other reaction(s): rash Other reaction(s): rash  . Lisinopril     Cough   . Metformin Hcl Er     Other reaction(s): upset stomach Other reaction(s): upset stomach  . Morphine Sulfate     Other reaction(s): shortness of breath and hives Other reaction(s): shortness of breath and hives  . Topamax [Topiramate]   . Triamterene-Hctz     Other reaction(s): rash Other reaction(s): rash  . Losartan Rash    Pt had rash, worsening dizziness, and nausea after starting losartan, which improved after stopping losartan    Medications Reviewed Today    Reviewed by Jacinta Shoe, [place] (Certified Medical Assistant) on 07/17/20 at 1414  Med List  Status: <None>  Medication Order Taking? Sig Documenting Provider Last Dose Status Informant  amLODipine (NORVASC) 10 MG tablet [id number] Yes Take 1 tablet (10 mg total) by mouth daily. [name] Dame, MD Taking Active   atorvastatin (LIPITOR) 10 MG tablet [id number] Yes TAKE HALF TABLET BY MOUTH DAILY Katsadouros, Vasilios, MD Taking Active   bictegravir-emtricitabine-tenofovir AF (BIKTARVY) 50-200-25 MG TABS tablet [id number] Yes Take 1 tablet by mouth daily. [place] Callas, NP Taking Active   budesonide Scnetx) 180 MCG/ACT inhaler [id number] Yes 2 inhalations 2 times per day. Rinse, gargle, and spit after use. Kozlow, Donnamarie Poag, MD Taking Active   buPROPion (WELLBUTRIN XL) 150 MG 24 hr tablet [id number] Yes Take one tablet in the morning [name] Dame, MD Taking Active            Med Note Merrilyn Puma, Rhona Leavens May 11, 2020 11:34 AM) Making her dizzy, will do trial at HS  cetirizine (ZYRTEC) 10 MG tablet [id number] Yes 1 tablet daily Kozlow, Donnamarie Poag, MD Taking Active   cholecalciferol (VITAMIN D) 25 MCG (1000 UNIT) tablet [id number] Yes TAKE ONE TABLET BY MOUTH DAILY Katsadouros, Vasilios, MD Taking Active   diclofenac Sodium  (VOLTAREN) 1 % GEL [id number] Yes APPLY FOUR GRAMS TOPICALLY FOUR TIMES A Harlow Mares, MD Taking Active   Elastic Bandages & Supports (WRIST/THUMB SPLINT/RIGHT MED) [place] [id number] Yes Wear on R thumb/wrist. Ladona Horns, MD Taking Active   famotidine (PEPCID) 40 MG tablet [id number] Yes TAKE ONE TABLET BY MOUTH AT BEDTIME Kozlow, Donnamarie Poag, MD Taking Active   gabapentin (NEURONTIN) 100 MG capsule [id number] Yes Take 1 capsule (100 mg total) by mouth 2 (two) times daily. [name] Dame, MD Taking Active   hydrocortisone (CORTEF) 10 MG tablet [id number] Yes Take 10 mg by mouth as directed. Pt takes 10 mg in the am and 5 mg at 11 am and 3 pm [provider] Taking Active            Med Note Bonnita Levan May 11, 2020 11:25 AM) Only taking BID, morning and noon. Taking in evening makes her gain too much weight  liraglutide (VICTOZA) 18 MG/3ML SOPN [id number] Yes Inject into the skin. [provider] Taking Active   metoprolol tartrate (LOPRESSOR) 25 MG tablet [id number] Yes Take 1 tablet (25 mg total) by mouth 2 (two) times daily. [name] Dame, MD Taking Active   montelukast (SINGULAIR) 10 MG tablet [id number] Yes TAKE ONE TABLET BY MOUTH AT BEDTIME Kozlow, Donnamarie Poag, MD Taking Active   nitroGLYCERIN (NITROSTAT) 0.4 MG SL tablet [id number] Yes Place 1 tablet (0.4 mg total) under the tongue every 5 (five) minutes as needed for chest pain. Isaiah Serge, NP Taking Active   nortriptyline (PAMELOR) 10 MG capsule [id number] Yes Take 2 capsules (20 mg total) by mouth at bedtime. Suzzanne Cloud, NP Taking Active   pantoprazole (PROTONIX) 40 MG tablet [id number] Yes Take 1 tablet (40 mg total) by mouth 2 (two) times daily. [name] Dame, MD Taking Active   potassium chloride SA (KLOR-CON) 20 MEQ tablet [id number] Yes Take 20 mEq by mouth daily. [provider] Taking Active   PROAIR HFA [phone] Base) MCG/ACT inhaler [id number] Yes INHALE TWO PUFFS BY MOUTH EVERY FOUR TO  SIX HOURS AS NEEDED FOR COUGH OR WHEEZE Kozlow, Donnamarie Poag, MD Taking Active   promethazine (PHENERGAN) 25 MG tablet [id number] Yes Take 1 tablet (25 mg total) by mouth every 6 (six) hours as needed  for nausea or vomiting. [name], DO Taking Active   sodium chloride (OCEAN) 0.65 % SOLN nasal spray [id number] Yes PLACE 1 SPRAY INTO BOTH NOSTRILS AS NEEDED FOR CONGESTION. [name], DO Taking Active   triamcinolone (NASACORT) 55 MCG/ACT AERO nasal inhaler [id number] Yes 1 spray each nostril 2 times per day Jiles Prows, MD Taking Active           Patient Active Problem List   Diagnosis Date Noted  . Left leg swelling 07/09/2020  . Grief reaction 06/08/2020  . Arm pain 02/10/2020  . Dizziness on standing 12/27/2019  . Generalized anxiety disorder with panic attacks 06/20/2019  . 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 Chronic migraine w/o aura w/o status migrainosus, not intractable 08/27/2018  . Disorder of left eustachian tube 05/19/2017  . Vertigo of central origin 01/23/2017  . Central perforation of tympanic membrane of left ear 12/09/2016  . Eustachian tube dysfunction, bilateral 06/15/2016  . Ear pain, right 06/15/2016  . 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 BPPV (benign paroxysmal positional vertigo) 10/02/2015  . Liver fibrosis ([place]) 12/04/2014  . Pituitary microadenoma ([place]) 08/08/2014  . 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 Vitamin D deficiency 06/29/2014  . Secondary adrenal insufficiency ([place]) 06/29/2014  . History of tympanostomy tube placement 02/07/2014  . Refusal of blood transfusions as patient is Jehovah's Witness 11/18/2013  . Hepatitis C virus infection cured after antiviral drug therapy 03/16/2012  . Health care maintenance 12/15/2011  . Opioid dependence ([place]) 10/05/2010  . HTN (hypertension) 10/05/2010  . Hyperlipidemia associated with type 2 diabetes mellitus ([place])  10/23/2008  . HIV disease ([place]) 05/09/2006  . Depression 05/09/2006  . Allergic rhinitis 05/09/2006  . GERD 05/09/2006    Conditions to be addressed/monitored: HTN, HLD, DM and Anxiety  Patient Care Plan: General Plan of Care (Adult)    Problem Identified: Health Promotion or Disease Self-Management (General Plan of Care)   Priority: Medium  Onset Date: 05/07/2020    Patient Care Plan: General Plan of Care (Adult)    Problem Identified: Health Promotion or Disease Self-Management (General Plan of Care)     Goal: Self-Management Plan Developed   Note:   Current Barriers:  . Chronic Disease Management support and education needs. . Patient helping to provide care to brother.  Nurse Case Manager Clinical Goal(s):  Marland Kitchen Over the next 30 days, patient will attend all scheduled medical appointments. . Over the next 30 days, patient will work with CM team pharmacist to review medications.  Interventions:  . Inter-disciplinary care team collaboration (see longitudinal plan of care) . Evaluation of current treatment plan and patient's adherence to plan as established by provider. . Reviewed medications with patient. Nash Dimmer with pharmacy regarding medications. . Discussed plans with patient for ongoing care management follow up and provided patient with direct contact information for care management team . Reviewed scheduled/upcoming provider appointments. . Pharmacy referral for medication review.  Patient Goals/Self-Care Activities Over the next 30 days, patient will:  -Attends all scheduled provider appointments Calls pharmacy for medication refills Calls provider office for new concerns or questions  Follow Up Plan: The Managed Medicaid care management team will reach out to the patient again over the next 30 days.  The patient has been provided with contact information for the Managed Medicaid care management team and has been advised to call with any health related  questions or concerns.      Task: Mutually Develop  and [employer] of Patient Goals   Note:   Care Management Activities:    - verbalization of feelings encouraged    Notes:    Patient Care Plan: Medication Management    Problem Identified: Health Promotion or Disease Self-Management (General Plan of Care)     Goal: Self-Management Plan Developed   Note:   Current Barriers:  . Unable to self administer medications as prescribed . Unable to explain what BP, Hyperlipidemia, and other disease states do to the body . Itching at night . ADR's from steroid TID  Pharmacist Clinical Goal(s):  Marland Kitchen Over the next 90 days, patient will contact provider office for questions/concerns as evidenced notation of same in electronic health record through collaboration with PharmD and provider.  .   Interventions: . Inter-disciplinary care team collaboration (see longitudinal plan of care) . Comprehensive medication review performed; medication list updated in electronic medical record . Patient will let specialist know, has appt soon, about how she isn't taking steroid TID (As directed) due to ADR's (Weight gain). I believe this is why she's itching at night (She's itching daily but the AM and noon doses are hiding it)  @RXCPDIABETES @ @RXCPHYPERTENSION @ @RXCPHYPERLIPIDEMIA @ @RXCPMENTALHEALTH @  Patient Goals/Self-Care Activities . Over the next 90 days, patient will:  - take medications as prescribed  Follow Up Plan: The care management team will reach out to the patient again over the next 90 days.     Task: Mutually Develop and Royce Macadamia Achievement of Patient Goals   Note:   Care Management Activities:    - verbalization of feelings encouraged    Notes:      Medication Assistance: None required. Patient affirms current coverage meets needs.   Follow up: Agree/  Plan: The care management team will reach out to the patient again over the next 90 days.   [place] Constable,  Pharm.D., Managed Medicaid Pharmacist - [phone]

## 2020-08-10 NOTE — Patient Instructions (Signed)
Hi Ms. Kressin, thank you for speaking with me today.  Ms. Caisse was given information about Medicaid Managed Care team care coordination services as a part of their [place] Medicaid benefit. [name] verbally consented to engagement with the [facility] Managed Care team.   For questions related to your [facility], please call: [phone] or visit the homepage here: [email or web address]  If you would like to schedule transportation through your [facility], please call the following number at least 2 days in advance of your appointment: [phone].   Call the [place] at [phone], at any time, 24 hours a day, 7 days a week. If you are in danger or need immediate medical attention call 911.  Ms. Burandt - following are the goals we discussed in your visit today:  Goals Addressed            This Visit's Progress   . Protect My Health       Timeframe:  Long-Range Goal Priority:  Medium Start Date:      05/07/20                       Expected End Date:      08/05/20                 Follow Up Date 09/09/20   - schedule appointment for vaccines needed due to my age or health - schedule recommended health tests (blood work, mammogram, colonoscopy, pap test) - schedule and keep appointment for annual check-up    Why is this important?    Screening tests can find diseases early when they are easier to treat.   Your doctor or nurse will talk with you about which tests are important for you.   Getting shots for common diseases like the flu and shingles will help prevent them.     Update 07-16-20:  Patient has appointment with PCP 07/07/20. Update 08/10/20:  Patient attending all appointments.      Patient verbalizes understanding of instructions provided today.   The Managed Medicaid care management team will reach out to the patient  again over the next 30 days.  The patient has been provided with contact information for the Managed Medicaid care management team and has been advised to call with any health related questions or concerns.   Aida Raider RN, BSN [place]  [facility] Curator - Managed Medicaid High Risk [phone].  Following is a copy of your plan of care:    Patient Care Plan: General Plan of Care (Adult)    Problem Identified: Health Promotion or Disease Self-Management (General Plan of Care)   Priority: Medium  Onset Date: 08/10/2020    Long-Range Goal: Self-Management Plan Developed   Start Date: 05/07/2020  Expected End Date: 11/09/2020  Recent Progress: Not on track  Priority: High  Note:   Current Barriers:  . Chronic Disease Management support and education needs. . Patient helping to provide care to brother. This causes patient a lot of stress and anxiety.  She has recently started taking Wellbutrin which she states is helping.  Patient declines any therapy services at this time. Marland Kitchen Update 07/16/2020: Patient's brother died 01-Jul-2020.  Patient is considering grief counseling with Hospice, declines SW referral at this time. Marland Kitchen Update 08/10/20:  patient states she is doing better-doesn't feel like she needs anything right now.  Nurse Case Manager Clinical Goal(s):  .  Over the next 30 days, patient will attend all scheduled medical appointments. . Over the next 30 days, patient will work with CM team pharmacist to review medications. Marland Kitchen Update 06/02/20:  Patient met with Pharmacist 05/11/20 and has follow up scheduled. Marland Kitchen Update 06/30/20:  Patient continues to work with Pharmacist.  Interventions:  . Inter-disciplinary care team collaboration (see longitudinal plan of care) . Evaluation of current treatment plan and patient's adherence to plan as established by provider. . Reviewed medications with patient. Nash Dimmer with pharmacy regarding  medications. . Discussed plans with patient for ongoing care management follow up and provided patient with direct contact information for care management team . Reviewed scheduled/upcoming provider appointments. Marland Kitchen Update 06/30/20:  Patient has appointment with PCP 07/07/20. Marland Kitchen Pharmacy referral for medication review-completed.  Patient Goals/Self-Care Activities Over the next 30 days, patient will:  -Attends all scheduled provider appointments Calls pharmacy for medication refills Calls provider office for new concerns or questions  Follow Up Plan: The Managed Medicaid care management team will reach out to the patient again over the next 30 days.  The patient has been provided with contact information for the Managed Medicaid care management team and has been advised to call with any health related questions or concerns.

## 2020-08-10 NOTE — Patient Instructions (Signed)
Visit Information  Ms. Leven was given information about Medicaid Managed Care team care coordination services as a part of their [place] [employer] benefit. [name] verbally consented to engagement with the [facility] Managed Care team.   For questions related to your [facility], please call: [phone] or visit the homepage here: [email or web address]  If you would like to schedule transportation through your [facility], please call the following number at least 2 days in advance of your appointment: [phone].   Call the [place] at [phone], at any time, 24 hours a day, 7 days a week. If you are in danger or need immediate medical attention call 911.  Ms. Mcfall - following are the goals we discussed in your visit today:  Goals Addressed   None     Please see education materials related to HTN provided as print materials.   Patient verbalizes understanding of instructions provided today.   The Managed Medicaid care management team will reach out to the patient again over the next 90 days.   [place] Constable, Pharm.D., Managed Medicaid Pharmacist [phone]   Following is a copy of your plan of care:  Patient Care Plan: General Plan of Care (Adult)    Problem Identified: Health Promotion or Disease Self-Management (General Plan of Care)   Priority: Medium  Onset Date: 05/07/2020    Patient Care Plan: General Plan of Care (Adult)    Problem Identified: Health Promotion or Disease Self-Management (General Plan of Care)     Long-Range Goal: Self-Management Plan Developed   Start Date: 05/07/2020  Expected End Date: 08/05/2020  Recent Progress: Not on track  Priority: High  Note:   Current Barriers:  . Chronic Disease Management support and education needs. . Patient helping to provide care to brother. This causes  patient a lot of stress and anxiety.  She has recently started taking Wellbutrin which she states is helping.  Patient declines any therapy services at this time. Marland Kitchen Update July 12, 2020: Patient's brother died Jun 27, 2020.  Patient is considering grief counseling with Hospice, declines SW referral at this time.  Nurse Case Manager Clinical Goal(s):  Marland Kitchen Over the next 30 days, patient will attend all scheduled medical appointments. . Over the next 30 days, patient will work with CM team pharmacist to review medications. Marland Kitchen Update 06/02/20:  Patient met with Pharmacist 05/11/20 and has follow up scheduled. Marland Kitchen Update 2020/07/12:  Patient continues to work with Pharmacist.  Interventions:  . Inter-disciplinary care team collaboration (see longitudinal plan of care) . Evaluation of current treatment plan and patient's adherence to plan as established by provider. . Reviewed medications with patient. Nash Dimmer with pharmacy regarding medications. . Discussed plans with patient for ongoing care management follow up and provided patient with direct contact information for care management team . Reviewed scheduled/upcoming provider appointments. Marland Kitchen Update 12-Jul-2020:  Patient has appointment with PCP 07/07/20. Marland Kitchen Pharmacy referral for medication review-completed.  Patient Goals/Self-Care Activities Over the next 30 days, patient will:  -Attends all scheduled provider appointments Calls pharmacy for medication refills Calls provider office for new concerns or questions  Follow Up Plan: The Managed Medicaid care management team will reach out to the patient again over the next 30 days.  The patient has been provided with contact information for the Managed Medicaid care management team and has been advised to call with any health related questions or concerns.      Task: Mutually Develop  and [employer] of Patient Goals   Note:   Care Management Activities:    - verbalization of feelings encouraged    Notes:     Patient Care Plan: Medication Management    Problem Identified: Health Promotion or Disease Self-Management (General Plan of Care)     Goal: Self-Management Plan Developed   Note:   Current Barriers:  . Unable to self administer medications as prescribed . Unable to explain what BP, Hyperlipidemia, and other disease states do to the body . Itching at night . ADR's from steroid TID  Pharmacist Clinical Goal(s):  Marland Kitchen Over the next 90 days, patient will contact provider office for questions/concerns as evidenced notation of same in electronic health record through collaboration with PharmD and provider.  .   Interventions: . Inter-disciplinary care team collaboration (see longitudinal plan of care) . Comprehensive medication review performed; medication list updated in electronic medical record . Patient will let specialist know, has appt soon, about how she isn't taking steroid TID (As directed) due to ADR's (Weight gain). I believe this is why she's itching at night (She's itching daily but the AM and noon doses are hiding it)  _0 @ _1 @ _2 @ _3 @  Patient Goals/Self-Care Activities . Over the next 90 days, patient will:  - take medications as prescribed  Follow Up Plan: The care management team will reach out to the patient again over the next 90 days.     Task: Mutually Develop and Royce Macadamia Achievement of Patient Goals   Note:   Care Management Activities:    - verbalization of feelings encouraged    Notes:

## 2020-08-10 NOTE — Patient Outreach (Signed)
Medicaid Managed Care   Nurse Care Manager Note  08/10/2020 Name:  [name] [name] MRN:  [id number] DOB:  11/06/1960  [name] [name] is an 60 y.o. year old female who is a primary patient of [name], MD.  The [facility] Managed Care Coordination team was consulted for assistance with:    chronic healthcare management needs.  Ms. Klingerman was given information about Medicaid Managed Care Coordination team services today. [name] [name] agreed to services and verbal consent obtained.  Engaged with patient by telephone for follow up visit in response to provider referral for case management and/or care coordination services.   Assessments/Interventions:  Review of past medical history, allergies, medications, health status, including review of consultants reports, laboratory and other test data, was performed as part of comprehensive evaluation and provision of chronic care management services.  SDOH (Social Determinants of Health) assessments and interventions performed:   Care Plan  Allergies  Allergen Reactions  . Acetaminophen Other (See Comments)    Inflamed liver, hospitalized REACTION: Had liver problems, was hospitalized Inflamed liver, hospitalized Other reaction(s): liver demage Other reaction(s): liver demage Other reaction(s): liver demage Other reaction(s): liver demage  . Morphine Hives and Rash    REACTION: rash  . Triamterene Hives  . Aspirin-Caffeine     Other reaction(s): liver demage Other reaction(s): liver demage  . Citalopram     Other reaction(s): INCREASED ANXIETY Other reaction(s): INCREASED ANXIETY  . Dyazide [Hydrochlorothiazide W-Triamterene] Hives  . Empagliflozin     Other reaction(s): stomach ache, diarrhea Other reaction(s): stomach ache, diarrhea  . Emtricitabine-Tenofovir Df     Other reaction(s): rash Other reaction(s): rash  . Lisinopril     Cough   . Metformin Hcl Er     Other reaction(s): upset stomach Other reaction(s): upset  stomach  . Morphine Sulfate     Other reaction(s): shortness of breath and hives Other reaction(s): shortness of breath and hives  . Topamax [Topiramate]   . Triamterene-Hctz     Other reaction(s): rash Other reaction(s): rash  . Losartan Rash    Pt had rash, worsening dizziness, and nausea after starting losartan, which improved after stopping losartan    Medications Reviewed Today    Reviewed by Gayla Medicus, RN (Registered Nurse) on 08/10/20 at 1443  Med List Status: <None>  Medication Order Taking? Sig Documenting Provider Last Dose Status Informant  albuterol (PROAIR HFA) 108 (90 Base) MCG/ACT inhaler [id number]  Inhale 1-2 puffs into the lungs every 4 (four) hours as needed for wheezing or shortness of breath. Kozlow, Donnamarie Poag, MD  Active   amLODipine (NORVASC) 10 MG tablet [id number] No Take 1 tablet (10 mg total) by mouth daily. [name], MD Taking Active   atorvastatin (LIPITOR) 10 MG tablet [id number] No TAKE HALF TABLET BY MOUTH DAILY Katsadouros, Vasilios, MD Taking Active   bictegravir-emtricitabine-tenofovir AF (BIKTARVY) 50-200-25 MG TABS tablet [id number] No Take 1 tablet by mouth daily. [place] Callas, NP Taking Active   budesonide [facility]) 180 MCG/ACT inhaler [id number] No 2 inhalations 2 times per day. Rinse, gargle, and spit after use. Kozlow, Donnamarie Poag, MD Taking Active   buPROPion (WELLBUTRIN XL) 150 MG 24 hr tablet [id number] No Take one tablet in the morning [name], MD Taking Active            Med Note Bonnita Levan May 11, 2020 11:34 AM) Making her dizzy, will do trial at HS  cetirizine (ZYRTEC) 10 MG tablet [id number] No  1 tablet daily Kozlow, Donnamarie Poag, MD Taking Active   cholecalciferol (VITAMIN D) 25 MCG (1000 UNIT) tablet [id number] No TAKE ONE TABLET BY MOUTH DAILY Katsadouros, Vasilios, MD Taking Active   diclofenac Sodium (VOLTAREN) 1 % GEL [id number]  APPLY FOUR GRAMS TOPICALLY FOUR TIMES A Worthy Keeler, MD  Active    Elastic Bandages & Supports (WRIST/THUMB SPLINT/RIGHT MED) MISC [id number] No Wear on R thumb/wrist. Ladona Horns, MD Taking Active   famotidine (PEPCID) 40 MG tablet [id number] No TAKE ONE TABLET BY MOUTH AT BEDTIME Kozlow, Donnamarie Poag, MD Taking Active   gabapentin (NEURONTIN) 100 MG capsule [id number] No Take 1 capsule (100 mg total) by mouth 2 (two) times daily. [name], MD Taking Active   hydrocortisone (CORTEF) 10 MG tablet [id number] No Take 10 mg by mouth as directed. Pt takes 10 mg in the am and 5 mg at 11 am and 3 pm [provider] Taking Active            Med Note Bonnita Levan May 11, 2020 11:25 AM) Only taking BID, morning and noon. Taking in evening makes her gain too much weight  liraglutide (VICTOZA) 18 MG/3ML SOPN [id number] No Inject into the skin. [provider] Taking Active   metoprolol tartrate (LOPRESSOR) 25 MG tablet [id number] No Take 1 tablet (25 mg total) by mouth 2 (two) times daily. [name], MD Taking Active   montelukast (SINGULAIR) 10 MG tablet [id number]  TAKE ONE TABLET BY MOUTH AT BEDTIME Kozlow, Donnamarie Poag, MD  Active   nitroGLYCERIN (NITROSTAT) 0.4 MG SL tablet [id number] No Place 1 tablet (0.4 mg total) under the tongue every 5 (five) minutes as needed for chest pain. Isaiah Serge, NP Taking Active   nortriptyline (PAMELOR) 10 MG capsule [id number] No Take 2 capsules (20 mg total) by mouth at bedtime. Suzzanne Cloud, NP Taking Active   pantoprazole (PROTONIX) 40 MG tablet [id number] No Take 1 tablet (40 mg total) by mouth 2 (two) times daily. [name], MD Taking Active   potassium chloride SA (KLOR-CON) 20 MEQ tablet [id number] No Take 20 mEq by mouth daily. [provider] Taking Active   promethazine (PHENERGAN) 25 MG tablet [id number] No Take 1 tablet (25 mg total) by mouth every 6 (six) hours as needed for nausea or vomiting. [name], DO Taking Active   sodium chloride (OCEAN) 0.65 % SOLN  nasal spray [id number] No PLACE 1 SPRAY INTO BOTH NOSTRILS AS NEEDED FOR CONGESTION. [name], DO Taking Active   triamcinolone (NASACORT) 55 MCG/ACT AERO nasal inhaler [id number] No 1 spray each nostril 2 times per day Jiles Prows, MD Taking Active           Patient Active Problem List   Diagnosis Date Noted  . Left leg swelling 07/09/2020  . Grief reaction 06/08/2020  . Arm pain 02/10/2020  . Dizziness on standing 12/27/2019  . Generalized anxiety disorder with panic attacks 06/20/2019  . 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 Chronic migraine w/o aura w/o status migrainosus, not intractable 08/27/2018  . Disorder of left eustachian tube 05/19/2017  . Vertigo of central origin 01/23/2017  . Central perforation of tympanic membrane of left ear 12/09/2016  . Eustachian tube dysfunction, bilateral 06/15/2016  . Ear pain, right 06/15/2016  . 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 BPPV (benign paroxysmal positional vertigo) 10/02/2015  . Liver fibrosis ([place]) 12/04/2014  . Pituitary microadenoma ([place]) 08/08/2014  .  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 Vitamin D deficiency 06/29/2014  . Secondary adrenal insufficiency ([place]) 06/29/2014  . History of tympanostomy tube placement 02/07/2014  . Refusal of blood transfusions as patient is Jehovah's Witness 11/18/2013  . Hepatitis C virus infection cured after antiviral drug therapy 03/16/2012  . Health care maintenance 12/15/2011  . Opioid dependence ([place]) 10/05/2010  . HTN (hypertension) 10/05/2010  . Hyperlipidemia associated with type 2 diabetes mellitus ([place]) 10/23/2008  . HIV disease ([place]) 05/09/2006  . Depression 05/09/2006  . Allergic rhinitis 05/09/2006  . GERD 05/09/2006    Conditions to be addressed/monitored per PCP order:  chronic healthcare managemnt needs, HIV, H/A, HTN, DM, asthma.  Care Plan : General Plan of Care (Adult)  Updates made by  Gayla Medicus, RN since 08/10/2020 12:00 AM    Problem: Health Promotion or Disease Self-Management (General Plan of Care)   Priority: Medium  Onset Date: 08/10/2020    Long-Range Goal: Self-Management Plan Developed   Start Date: 05/07/2020  Expected End Date: 11/09/2020  Recent Progress: Not on track  Priority: High  Note:   Current Barriers:  . Chronic Disease Management support and education needs. . Patient helping to provide care to brother. This causes patient a lot of stress and anxiety.  She has recently started taking Wellbutrin which she states is helping.  Patient declines any therapy services at this time. Marland Kitchen Update 2020-07-19: Patient's brother died July 04, 2020.  Patient is considering grief counseling with Hospice, declines SW referral at this time. Marland Kitchen Update 08/10/20:  patient states she is doing better-doesn't feel like she needs anything right now.  Nurse Case Manager Clinical Goal(s):  Marland Kitchen Over the next 30 days, patient will attend all scheduled medical appointments. . Over the next 30 days, patient will work with CM team pharmacist to review medications. Marland Kitchen Update 06/02/20:  Patient met with Pharmacist 05/11/20 and has follow up scheduled. Marland Kitchen Update 07-19-20:  Patient continues to work with Pharmacist.  Interventions:  . Inter-disciplinary care team collaboration (see longitudinal plan of care) . Evaluation of current treatment plan and patient's adherence to plan as established by provider. . Reviewed medications with patient. Nash Dimmer with pharmacy regarding medications. . Discussed plans with patient for ongoing care management follow up and provided patient with direct contact information for care management team . Reviewed scheduled/upcoming provider appointments. Marland Kitchen Update 2020-07-19:  Patient has appointment with PCP 07/07/20. Marland Kitchen Pharmacy referral for medication review-completed.  Patient Goals/Self-Care Activities Over the next 30 days, patient will:  -Attends all scheduled  provider appointments Calls pharmacy for medication refills Calls provider office for new concerns or questions  Follow Up Plan: The Managed Medicaid care management team will reach out to the patient again over the next 30 days.  The patient has been provided with contact information for the Managed Medicaid care management team and has been advised to call with any health related questions or concerns.     Follow Up:  Patient agrees to Care Plan and Follow-up.  Plan: The Managed Medicaid care management team will reach out to the patient again over the next 30 days. and The patient has been provided with contact information for the Managed Medicaid care management team and has been advised to call with any health related questions or concerns.  Date/time of next scheduled RN care management/care coordination outreach:  09/09/20 at 230.

## 2020-08-18 ENCOUNTER — Ambulatory Visit (HOSPITAL_COMMUNITY): Payer: Medicaid Other | Attending: Cardiology

## 2020-08-18 ENCOUNTER — Other Ambulatory Visit: Payer: Self-pay

## 2020-08-18 DIAGNOSIS — R079 Chest pain, unspecified: Secondary | ICD-10-CM

## 2020-08-18 DIAGNOSIS — R0600 Dyspnea, unspecified: Secondary | ICD-10-CM | POA: Diagnosis not present

## 2020-08-18 LAB — ECHOCARDIOGRAM COMPLETE
Area-P 1/2: 5.75 cm2
S' Lateral: 2.7 cm

## 2020-08-25 ENCOUNTER — Other Ambulatory Visit: Payer: Self-pay

## 2020-08-25 ENCOUNTER — Ambulatory Visit (INDEPENDENT_AMBULATORY_CARE_PROVIDER_SITE_OTHER): Payer: Medicaid Other | Admitting: Allergy and Immunology

## 2020-08-25 DIAGNOSIS — J4540 Moderate persistent asthma, uncomplicated: Secondary | ICD-10-CM

## 2020-08-25 DIAGNOSIS — F419 Anxiety disorder, unspecified: Secondary | ICD-10-CM

## 2020-08-25 DIAGNOSIS — L299 Pruritus, unspecified: Secondary | ICD-10-CM | POA: Diagnosis not present

## 2020-08-25 DIAGNOSIS — K219 Gastro-esophageal reflux disease without esophagitis: Secondary | ICD-10-CM

## 2020-08-25 DIAGNOSIS — J3089 Other allergic rhinitis: Secondary | ICD-10-CM

## 2020-08-25 MED ORDER — ALBUTEROL SULFATE HFA 108 (90 BASE) MCG/ACT IN AERS: 1.0000 | INHALATION_SPRAY | RESPIRATORY_TRACT | 0 refills | Status: DC | PRN

## 2020-08-25 MED ORDER — GABAPENTIN 100 MG PO CAPS: 100.0000 mg | ORAL_CAPSULE | Freq: Three times a day (TID) | ORAL | 5 refills | Status: DC

## 2020-08-25 MED ORDER — FAMOTIDINE 40 MG PO TABS: 40.0000 mg | ORAL_TABLET | Freq: Every day | ORAL | 5 refills | Status: DC

## 2020-08-25 MED ORDER — TRIAMCINOLONE ACETONIDE 55 MCG/ACT NA AERO: INHALATION_SPRAY | NASAL | 5 refills | Status: DC

## 2020-08-25 MED ORDER — MONTELUKAST SODIUM 10 MG PO TABS: 1.0000 | ORAL_TABLET | Freq: Every day | ORAL | 5 refills | Status: DC

## 2020-08-25 MED ORDER — CETIRIZINE HCL 10 MG PO TABS: ORAL_TABLET | ORAL | 6 refills | Status: DC

## 2020-08-25 MED ORDER — BREZTRI AEROSPHERE 160-9-4.8 MCG/ACT IN AERO: 2.0000 | INHALATION_SPRAY | Freq: Two times a day (BID) | RESPIRATORY_TRACT | 5 refills | Status: DC

## 2020-08-25 NOTE — Patient Instructions (Addendum)
  1.  Continue to Treat and prevent inflammation:   A.  Montelukast 10 mg - 1 tablet 1 time per day  B.  START Breztri - 2 inhalations 1-2 times per day (Empty lungs)  C.  Nasacort -1 spray each nostril 1-2 times per day  D.  DO NOT use Pulmicort  2.  Continue to Treat and prevent reflux:   A.  Protonix 40 mg in a.m.  B.  Famotidine 40 mg in p.m.    3.  Continue to treat and prevent itchiness:    A. cetirizine 10 mg -1-2 tablets 1-2 times a day (maximum 40mg )  B. INCREASE gabapentin 100 mg - 1 tablet 3 times a day  4.  If needed:   A.  OTC Benadryl  B.  OTC nasal saline  C.  Pro Air HFA 2 puffs every 4-6 hours  D.  Nasal saline  5. Call clinic in 2 weeks with update  6. Return to clinic in 6 months or earlier if problem

## 2020-08-25 NOTE — Progress Notes (Signed)
[place]   Follow-up Note  Referring Provider: [name], MD Primary Provider: [name], MD Date of Office Visit: 08/25/2020  Subjective:   [name] [name] (DOB: May 30, 1960) is a 60 y.o. female who returns to the [facility] on 08/25/2020 in re-evaluation of the following:  HPI: Masiah returns to this clinic in evaluation of asthma and allergic rhinitis and history of LPR and history of pruritic disorder.  Her last visit to this clinic was 24 December 2019.  It sounds as though she has done relatively well with her airway, not requiring additional systemic steroids, other than her daily steroid administration for adrenal insufficiency.  Unfortunately, about 2 weeks ago she started to develop a cough.  Her cough initially was quite significant associated with gagging and retching and posttussive emesis and appeared to be related to outdoor exposure.  When she used a short acting bronchodilator it did help this issue.  She is now doing a little bit better regarding her cough as she does not have any posttussive emesis.  There is not really an obvious provoking factor giving rise to this flareup.  She believes that her nose is doing very well while using Nasacort and montelukast.  She believes that her reflux is under very good control while using a proton pump inhibitor and H2 receptor blocker.  During her last visit we started her on gabapentin for her pruritic disorder and this has worked very well.  While using gabapentin 100 mg tablet twice a day she is only plagued with itchiness starting around 11 PM at nighttime.  Allergies as of 08/25/2020      Reactions   Acetaminophen Other (See Comments)   Inflamed liver, hospitalized REACTION: Had liver problems, was hospitalized Inflamed liver, hospitalized Other reaction(s): liver demage Other reaction(s): liver demage Other reaction(s): liver demage Other  reaction(s): liver demage   Morphine Hives, Rash   REACTION: rash   Triamterene Hives   Aspirin-caffeine    Other reaction(s): liver demage Other reaction(s): liver demage   Citalopram    Other reaction(s): INCREASED ANXIETY Other reaction(s): INCREASED ANXIETY   Dyazide [hydrochlorothiazide W-triamterene] Hives   Empagliflozin    Other reaction(s): stomach ache, diarrhea Other reaction(s): stomach ache, diarrhea   Emtricitabine-tenofovir Df    Other reaction(s): rash Other reaction(s): rash   Lisinopril    Cough    Metformin Hcl Er    Other reaction(s): upset stomach Other reaction(s): upset stomach   Morphine Sulfate    Other reaction(s): shortness of breath and hives Other reaction(s): shortness of breath and hives   Topamax [topiramate]    Triamterene-hctz    Other reaction(s): rash Other reaction(s): rash   Losartan Rash   Pt had rash, worsening dizziness, and nausea after starting losartan, which improved after stopping losartan      Medication List      albuterol 108 (90 Base) MCG/ACT inhaler Commonly known as: ProAir HFA Inhale 1-2 puffs into the lungs every 4 (four) hours as needed for wheezing or shortness of breath.   amLODipine 10 MG tablet Commonly known as: NORVASC Take 1 tablet (10 mg total) by mouth daily.   atorvastatin 10 MG tablet Commonly known as: LIPITOR TAKE HALF TABLET BY MOUTH DAILY   Biktarvy 50-200-25 MG Tabs tablet Generic drug: bictegravir-emtricitabine-tenofovir AF Take 1 tablet by mouth daily.   buPROPion 150 MG 24 hr tablet Commonly known as: WELLBUTRIN XL Take one tablet in the morning  cetirizine 10 MG tablet Commonly known as: ZYRTEC 1 tablet daily   cholecalciferol 25 MCG (1000 UNIT) tablet Commonly known as: VITAMIN D TAKE ONE TABLET BY MOUTH DAILY   diclofenac Sodium 1 % Gel Commonly known as: VOLTAREN APPLY FOUR GRAMS TOPICALLY FOUR TIMES A DAY   famotidine 40 MG tablet Commonly known as: PEPCID TAKE ONE  TABLET BY MOUTH AT BEDTIME   gabapentin 100 MG capsule Commonly known as: NEURONTIN Take 1 capsule (100 mg total) by mouth 2 (two) times daily.   hydrocortisone 10 MG tablet Commonly known as: CORTEF Take 10 mg by mouth as directed. Pt takes 10 mg in the am and 5 mg at 11 am and 3 pm   liraglutide 18 MG/3ML Sopn Commonly known as: VICTOZA Inject into the skin.   metoprolol tartrate 25 MG tablet Commonly known as: LOPRESSOR Take 1 tablet (25 mg total) by mouth 2 (two) times daily.   montelukast 10 MG tablet Commonly known as: SINGULAIR TAKE ONE TABLET BY MOUTH AT BEDTIME   nitroGLYCERIN 0.4 MG SL tablet Commonly known as: NITROSTAT Place 1 tablet (0.4 mg total) under the tongue every 5 (five) minutes as needed for chest pain.   nortriptyline 10 MG capsule Commonly known as: PAMELOR Take 2 capsules (20 mg total) by mouth at bedtime.   pantoprazole 40 MG tablet Commonly known as: PROTONIX Take 1 tablet (40 mg total) by mouth 2 (two) times daily.   potassium chloride SA 20 MEQ tablet Commonly known as: KLOR-CON Take 20 mEq by mouth daily.   promethazine 25 MG tablet Commonly known as: PHENERGAN Take 1 tablet (25 mg total) by mouth every 6 (six) hours as needed for nausea or vomiting.   Pulmicort Flexhaler 180 MCG/ACT inhaler Generic drug: budesonide 2 inhalations 2 times per day. Rinse, gargle, and spit after use.   sodium chloride 0.65 % Soln nasal spray Commonly known as: OCEAN PLACE 1 SPRAY INTO BOTH NOSTRILS AS NEEDED FOR CONGESTION.   triamcinolone 55 MCG/ACT Aero nasal inhaler Commonly known as: NASACORT 1 spray each nostril 2 times per day   Wrist/Thumb Splint/Right Med Misc Wear on R thumb/wrist.       Past Medical History:  Diagnosis Date  . Allergic rhinitis 05/09/2006  . Allergy   . Anxiety   . Arthritis   . Asthma   . CHF (congestive heart failure) ([place])   . Chronic back pain   . Diabetes mellitus without complication ([place])   . GERD  (gastroesophageal reflux disease)   . Heart murmur    as a child  . 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 s/p 12 week course of simeprevir and sofosbuvir between October 2014 and January 2015 with resolution.  Marland Kitchen History of shingles   . HIV infection ([place])    1994  . Hyperlipidemia    no meds taken now  . Hypertension   . Migraine   . Pituitary microadenoma ([place]) 08/08/2014  . Prediabetes   . Refusal of blood transfusions as patient is Jehovah's Witness   . Secondary adrenal insufficiency ([place]) 06/29/2014  . Urticaria     Past Surgical History:  Procedure Laterality Date  . BUNIONECTOMY     b/l  . COLECTOMY     2003 for diverticulitis, had colostomy bag and then reversed  . COLONOSCOPY    . HAND SURGERY    . NASAL SINUS SURGERY    . SHOULDER SURGERY  left  . TONSILLECTOMY      Review of systems negative except as noted in HPI / PMHx or noted below:  Review of Systems  Constitutional: Negative.   HENT: Negative.   Eyes: Negative.   Respiratory: Negative.   Cardiovascular: Negative.   Gastrointestinal: Negative.   Genitourinary: Negative.   Musculoskeletal: Negative.   Skin: Negative.   Neurological: Negative.   Endo/Heme/Allergies: Negative.   Psychiatric/Behavioral: Negative.      Objective:   Vitals:   08/25/20 1117  BP: 130/82  Pulse: 88  Resp: 16  Temp: 98.4 F (36.9 C)  SpO2: 97%   Height: 5\' 1"  (154.9 cm)  Weight: 179 lb 6.4 oz (81.4 kg)   Physical Exam Constitutional:      Appearance: She is not diaphoretic.  HENT:     Head: Normocephalic.     Right Ear: Ear canal and external ear normal. Tympanic membrane is perforated.     Left Ear: Ear canal and external ear normal. Tympanic membrane is perforated.     Nose: Nose normal. No mucosal edema or rhinorrhea.     Mouth/Throat:     Pharynx: Uvula midline. No oropharyngeal exudate.  Eyes:     Conjunctiva/sclera: Conjunctivae normal.  Neck:     Thyroid:  No thyromegaly.     Trachea: Trachea normal. No tracheal tenderness or tracheal deviation.  Cardiovascular:     Rate and Rhythm: Normal rate and regular rhythm.     Heart sounds: Normal heart sounds, S1 normal and S2 normal. No murmur heard.   Pulmonary:     Effort: No respiratory distress.     Breath sounds: Normal breath sounds. No stridor. No wheezing or rales.  Lymphadenopathy:     Head:     Right side of head: No tonsillar adenopathy.     Left side of head: No tonsillar adenopathy.     Cervical: No cervical adenopathy.  Skin:    Findings: No erythema or rash.     Nails: There is no clubbing.  Neurological:     Mental Status: She is alert.     Diagnostics:    Spirometry was performed and demonstrated an FEV1 of 1.37 at 70 % of predicted.  Assessment and Plan:   1. Not well controlled moderate persistent asthma   2. Other allergic rhinitis   3. LPRD (laryngopharyngeal reflux disease)   4. Pruritic disorder     1.  Continue to Treat and prevent inflammation:   A.  Montelukast 10 mg - 1 tablet 1 time per day  B.  START Breztri - 2 inhalations 1-2 times per day (Empty lungs)  C.  Nasacort -1 spray each nostril 1-2 times per day  D.  DO NOT use Pulmicort  2.  Continue to Treat and prevent reflux:   A.  Protonix 40 mg in a.m.  B.  Famotidine 40 mg in p.m.    3.  Continue to treat and prevent itchiness:    A. cetirizine 10 mg -1-2 tablets 1-2 times a day (maximum 40mg )  B. INCREASE gabapentin 100 mg - 1 tablet 3 times a day  4.  If needed:   A.  OTC Benadryl  B.  OTC nasal saline  C.  Pro Air HFA 2 puffs every 4-6 hours  D.  Nasal saline  5. Call clinic in 2 weeks with update  6. Return to clinic in 6 months or earlier if problem  I am going to have [facility] utilize a triple inhaler  to replace her Pulmicort at this point and we will see what happens over the course of the next 2 weeks with the addition of this medication.  If she completely clears up her  respiratory tract irritation and inflammation then there may be an opportunity to have her go back to using Pulmicort as she did quite well on that medication for a prolonged period in time.  She will continue to address her reflux issue.  Her pruritic disorder appears to be under good control with gabapentin but she will increase her dose to 300 mg a day to capture some of her late evening itchiness.  I will see her back in this clinic in 6 months or earlier if there is a problem.  Allena Katz, MD Allergy / Immunology [place]

## 2020-08-26 ENCOUNTER — Encounter: Payer: Self-pay | Admitting: Allergy and Immunology

## 2020-08-28 ENCOUNTER — Ambulatory Visit: Payer: Medicaid Other | Admitting: Physician Assistant

## 2020-09-09 ENCOUNTER — Other Ambulatory Visit: Payer: Self-pay | Admitting: Obstetrics and Gynecology

## 2020-09-09 ENCOUNTER — Other Ambulatory Visit: Payer: Self-pay

## 2020-09-09 ENCOUNTER — Telehealth: Payer: Self-pay | Admitting: Allergy and Immunology

## 2020-09-09 NOTE — Telephone Encounter (Signed)
Spoke with patient, informed her that her Breztri and albuterol were sent in at the time of her visit. Patient stated she will call the pharmacy and have them fill it. Patient will call the office if she has any problems.

## 2020-09-09 NOTE — Patient Instructions (Signed)
Hi [name] [name], thank you for speaking with me today.  [name] [name] was given information about Medicaid Managed Care team care coordination services as a part of their [place] Medicaid benefit. [name] [name] verbally consented to engagement with the [facility] Managed Care team.   For questions related to your [facility], please call: [phone] or visit the homepage here: [email or web address]  If you would like to schedule transportation through your [facility], please call the following number at least 2 days in advance of your appointment: [phone].   Call the [facility] at [phone], at any time, 24 hours a day, 7 days a week. If you are in danger or need immediate medical attention call 911.  [name] [name] - following are the goals we discussed in your visit today:  Goals Addressed            This Visit's Progress   . Protect My Health       Timeframe:  Long-Range Goal Priority:  Medium Start Date:      05/07/20                       Expected End Date:      12/10/20               Follow Up Date 10/10/20   - schedule appointment for vaccines needed due to my age or health - schedule recommended health tests (blood work, mammogram, colonoscopy, pap test) - schedule and keep appointment for annual check-up    Why is this important?    Screening tests can find diseases early when they are easier to treat.   Your doctor or nurse will talk with you about which tests are important for you.   Getting shots for common diseases like the flu and shingles will help prevent them.     Update 06/30/20:  Patient has appointment with PCP 07/07/20. Update 08/10/20:  Patient attending all appointments. Update 09/09/20:  Patient with no complaints, attending all appointments.      Patient verbalizes understanding of instructions provided today.    The Managed Medicaid care management team will reach out to the patient again over the next 30 days.  The patient has been provided with contact information for the Managed Medicaid care management team and has been advised to call with any health related questions or concerns.   Aida Raider RN, BSN [place]  [facility] - Managed Medicaid High Risk [phone].  Following is a copy of your plan of care:  Patient Care Plan: General Plan of Care (Adult)    Problem Identified: Health Promotion or Disease Self-Management (General Plan of Care)   Priority: Medium  Onset Date: 05/07/2020    Patient Care Plan: General Plan of Care (Adult)    Problem Identified: Health Promotion or Disease Self-Management (General Plan of Care)   Priority: Medium  Onset Date: 08/10/2020    Long-Range Goal: Self-Management Plan Developed   Start Date: 05/07/2020  Expected End Date: 11/09/2020  Recent Progress: Not on track  Priority: High  Note:   Current Barriers:  . Chronic Disease Management support and education needs. . Patient helping to provide care to brother. This causes patient a lot of stress and anxiety.  She has recently started taking Wellbutrin which she states is helping.  Patient declines any therapy services at this time. Marland Kitchen Update 06/30/20: Patient's brother  died 06/15/20.  Patient is considering grief counseling with Hospice, declines SW referral at this time. Marland Kitchen Update 08/10/20:  Patient states she is doing better-doesn't feel like she needs anything right now. Marland Kitchen Update 09/09/20:  Patient without complaint today.  Breathing and itching improved with medication adjustment.  Nurse Case Manager Clinical Goal(s):  Marland Kitchen Over the next 30 days, patient will attend all scheduled medical appointments. . Over the next 30 days, patient will work with CM team pharmacist to review medications. Marland Kitchen Update 06/02/20:  Patient met with Pharmacist 05/11/20 and has follow  up scheduled. Marland Kitchen Update 06/30/20:  Patient continues to work with Pharmacist.  Interventions:  . Inter-disciplinary care team collaboration (see longitudinal plan of care) . Evaluation of current treatment plan and patient's adherence to plan as established by provider. . Reviewed medications with patient. Nash Dimmer with pharmacy regarding medications. . Discussed plans with patient for ongoing care management follow up and provided patient with direct contact information for care management team . Reviewed scheduled/upcoming provider appointments. Marland Kitchen Update 06/30/20:  Patient has appointment with PCP 07/07/20. Marland Kitchen Update 09/09/20:  Patient attending all scheduled appointments. . Pharmacy referral for medication review-completed.  Patient Goals/Self-Care Activities Over the next 30 days, patient will:  -Attends all scheduled provider appointments Calls pharmacy for medication refills Calls provider office for new concerns or questions  Follow Up Plan: The Managed Medicaid care management team will reach out to the patient again over the next 30 days.  The patient has been provided with contact information for the Managed Medicaid care management team and has been advised to call with any health related questions or concerns.

## 2020-09-09 NOTE — Telephone Encounter (Signed)
Patient was seen 08/25/20. Dr. Neldon Mc wanted her to call back to let him know how Judithann Sauger was working for her. She said it is working and she needs a prescription for that and a prescription for her albuterol. [facility] on [address].

## 2020-09-09 NOTE — Patient Outreach (Signed)
Medicaid Managed Care   Nurse Care Manager Note  09/09/2020 Name:  [name] [name] MRN:  [id number] DOB:  1961-04-05  [name] [name] is an 60 y.o. year old female who is a primary patient of [name] Dame, MD.  The [facility] Managed Care Coordination team was consulted for assistance with:    chronic healthcare management needs.  [name] [name] was given information about Medicaid Managed Care Coordination team services today. [name] [name] agreed to services and verbal consent obtained.  Engaged with patient by telephone for follow up visit in response to provider referral for case management and/or care coordination services.   Assessments/Interventions:  Review of past medical history, allergies, medications, health status, including review of consultants reports, laboratory and other test data, was performed as part of comprehensive evaluation and provision of chronic care management services.  SDOH (Social Determinants of Health) assessments and interventions performed:   Care Plan  Allergies  Allergen Reactions  . Acetaminophen Other (See Comments)    Inflamed liver, hospitalized REACTION: Had liver problems, was hospitalized Inflamed liver, hospitalized Other reaction(s): liver demage Other reaction(s): liver demage Other reaction(s): liver demage Other reaction(s): liver demage  . Morphine Hives and Rash    REACTION: rash  . Triamterene Hives  . Aspirin-Caffeine     Other reaction(s): liver demage Other reaction(s): liver demage  . Citalopram     Other reaction(s): INCREASED ANXIETY Other reaction(s): INCREASED ANXIETY  . Dyazide [Hydrochlorothiazide W-Triamterene] Hives  . Empagliflozin     Other reaction(s): stomach ache, diarrhea Other reaction(s): stomach ache, diarrhea  . Emtricitabine-Tenofovir Df     Other reaction(s): rash Other reaction(s): rash  . Lisinopril     Cough   . Metformin Hcl Er     Other reaction(s): upset stomach Other reaction(s): upset  stomach  . Morphine Sulfate     Other reaction(s): shortness of breath and hives Other reaction(s): shortness of breath and hives  . Topamax [Topiramate]   . Triamterene-Hctz     Other reaction(s): rash Other reaction(s): rash  . Losartan Rash    Pt had rash, worsening dizziness, and nausea after starting losartan, which improved after stopping losartan    Medications Reviewed Today    Reviewed by Gayla Medicus, RN (Registered Nurse) on 09/09/20 at 1342  Med List Status: <None>  Medication Order Taking? Sig Documenting Provider Last Dose Status Informant  albuterol (PROAIR HFA) 108 (90 Base) MCG/ACT inhaler [id number]  Inhale 1-2 puffs into the lungs every 4 (four) hours as needed for wheezing or shortness of breath. Kozlow, Donnamarie Poag, MD  Active   amLODipine (NORVASC) 10 MG tablet [id number]  Take 1 tablet (10 mg total) by mouth daily. [name] Dame, MD  Active   atorvastatin (LIPITOR) 10 MG tablet [id number]  TAKE HALF TABLET BY MOUTH DAILY Katsadouros, Vasilios, MD  Active   bictegravir-emtricitabine-tenofovir AF (BIKTARVY) 50-200-25 MG TABS tablet [id number]  Take 1 tablet by mouth daily. [place] Callas, NP  Active   Budeson-Glycopyrrol-Formoterol (BREZTRI AEROSPHERE) 160-9-4.8 MCG/ACT Hollie Salk [id number]  Inhale 2 puffs into the lungs in the morning and at bedtime. On empty lungs Kozlow, Donnamarie Poag, MD  Active   budesonide [facility]) 180 MCG/ACT inhaler [id number] No 2 inhalations 2 times per day. Rinse, gargle, and spit after use.  Patient not taking: Reported on 09/09/2020   Jiles Prows, MD Not Taking Active   buPROPion (WELLBUTRIN XL) 150 MG 24 hr tablet [id number]  Take one tablet in the morning [name] Dame, MD  Active            Med Note Bonnita Levan May 11, 2020 11:34 AM) Making her dizzy, will do trial at HS  cetirizine (ZYRTEC) 10 MG tablet [id number]  1 tablet daily Kozlow, Donnamarie Poag, MD  Active   cholecalciferol (VITAMIN D) 25 MCG (1000 UNIT) tablet  [id number]  TAKE ONE TABLET BY MOUTH DAILY Katsadouros, Vasilios, MD  Active   diclofenac Sodium (VOLTAREN) 1 % GEL [id number]  APPLY FOUR GRAMS TOPICALLY FOUR TIMES A Worthy Keeler, MD  Active   Elastic Bandages & Supports (WRIST/THUMB SPLINT/RIGHT MED) MISC [id number]  Wear on R thumb/wrist. Ladona Horns, MD  Active   famotidine (PEPCID) 40 MG tablet [id number]  Take 1 tablet (40 mg total) by mouth at bedtime. Kozlow, Donnamarie Poag, MD  Active   gabapentin (NEURONTIN) 100 MG capsule [id number]  Take 1 capsule (100 mg total) by mouth 3 (three) times daily. Kozlow, Donnamarie Poag, MD  Active   hydrocortisone (CORTEF) 10 MG tablet [id number]  Take 10 mg by mouth as directed. Pt takes 10 mg in the am and 5 mg at 11 am and 3 pm [provider]  Active            Med Note Bonnita Levan May 11, 2020 11:25 AM) Only taking BID, morning and noon. Taking in evening makes her gain too much weight  liraglutide (VICTOZA) 18 MG/3ML SOPN [id number]  Inject into the skin. [provider]  Active   metoprolol tartrate (LOPRESSOR) 25 MG tablet [id number]  Take 1 tablet (25 mg total) by mouth 2 (two) times daily. [name] Dame, MD  Active   montelukast (SINGULAIR) 10 MG tablet [id number]  Take 1 tablet (10 mg total) by mouth at bedtime. Kozlow, Donnamarie Poag, MD  Active   nitroGLYCERIN (NITROSTAT) 0.4 MG SL tablet [id number]  Place 1 tablet (0.4 mg total) under the tongue every 5 (five) minutes as needed for chest pain. Isaiah Serge, NP  Active   nortriptyline (PAMELOR) 10 MG capsule [id number]  Take 2 capsules (20 mg total) by mouth at bedtime. Suzzanne Cloud, NP  Active   pantoprazole (PROTONIX) 40 MG tablet [id number]  Take 1 tablet (40 mg total) by mouth 2 (two) times daily. [name] Dame, MD  Active   potassium chloride SA (KLOR-CON) 20 MEQ tablet [id number]  Take 20 mEq by mouth daily. [provider]  Active   promethazine (PHENERGAN) 25 MG tablet [id number]  Take 1 tablet (25 mg total) by  mouth every 6 (six) hours as needed for nausea or vomiting. Kalman Shan Ratliff, DO  Active   sodium chloride (OCEAN) 0.65 % SOLN nasal spray [id number]  PLACE 1 SPRAY INTO BOTH NOSTRILS AS NEEDED FOR CONGESTION. Kalman Shan Ratliff, DO  Active   triamcinolone (NASACORT) 55 MCG/ACT AERO nasal inhaler [id number]  1 spray each nostril 2 times per day Jiles Prows, MD  Active           Patient Active Problem List   Diagnosis Date Noted  . Left leg swelling 07/09/2020  . Grief reaction 06/08/2020  . Arm pain 02/10/2020  . Dizziness on standing 12/27/2019  . Generalized anxiety disorder with panic attacks 06/20/2019  . 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 Chronic migraine w/o aura w/o status migrainosus, not intractable 08/27/2018  . Disorder of left eustachian tube 05/19/2017  . Vertigo of central origin 01/23/2017  . Central perforation of tympanic membrane  of left ear 12/09/2016  . Eustachian tube dysfunction, bilateral Jun 22, 2016  . Ear pain, right 06/22/16  . 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 BPPV (benign paroxysmal positional vertigo) 10/02/2015  . Liver fibrosis ([place]) 12/04/2014  . Pituitary microadenoma ([place]) 08/08/2014  . 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 Vitamin D deficiency 06/29/2014  . Secondary adrenal insufficiency ([place]) 06/29/2014  . History of tympanostomy tube placement 02/07/2014  . Refusal of blood transfusions as patient is Jehovah's Witness 11/18/2013  . Hepatitis C virus infection cured after antiviral drug therapy 03/16/2012  . Health care maintenance 12/15/2011  . Opioid dependence ([place]) 10/05/2010  . HTN (hypertension) 10/05/2010  . Hyperlipidemia associated with type 2 diabetes mellitus ([place]) 10/23/2008  . HIV disease ([place]) 05/09/2006  . Depression 05/09/2006  . Allergic rhinitis 05/09/2006  . GERD 05/09/2006    Conditions to be addressed/monitored per PCP order:   chronic healthcare management needs, HIV, H/A, HTN, DM, asthma  Care Plan : General Plan of Care (Adult)  Updates made by Gayla Medicus, RN since 09/09/2020 12:00 AM    Problem: Health Promotion or Disease Self-Management (General Plan of Care)   Priority: Medium  Onset Date: 08/10/2020    Long-Range Goal: Self-Management Plan Developed   Start Date: 05/07/2020  Expected End Date: 11/09/2020  Recent Progress: Not on track  Priority: High  Note:   Current Barriers:  . Chronic Disease Management support and education needs. . Patient helping to provide care to brother. This causes patient a lot of stress and anxiety.  She has recently started taking Wellbutrin which she states is helping.  Patient declines any therapy services at this time. Marland Kitchen Update Jul 07, 2020: Patient's brother died Jun 22, 2020.  Patient is considering grief counseling with Hospice, declines SW referral at this time. Marland Kitchen Update 08/10/20:  Patient states she is doing better-doesn't feel like she needs anything right now. Marland Kitchen Update 09/09/20:  Patient without complaint today.  Breathing and itching improved with medication adjustment.  Nurse Case Manager Clinical Goal(s):  Marland Kitchen Over the next 30 days, patient will attend all scheduled medical appointments. . Over the next 30 days, patient will work with CM team pharmacist to review medications. Marland Kitchen Update 06/02/20:  Patient met with Pharmacist 05/11/20 and has follow up scheduled. Marland Kitchen Update July 07, 2020:  Patient continues to work with Pharmacist.  Interventions:  . Inter-disciplinary care team collaboration (see longitudinal plan of care) . Evaluation of current treatment plan and patient's adherence to plan as established by provider. . Reviewed medications with patient. Nash Dimmer with pharmacy regarding medications. . Discussed plans with patient for ongoing care management follow up and provided patient with direct contact information for care management team . Reviewed scheduled/upcoming  provider appointments. Marland Kitchen Update 07-07-20:  Patient has appointment with PCP 07/07/20. Marland Kitchen Update 09/09/20:  Patient attending all scheduled appointments. . Pharmacy referral for medication review-completed.  Patient Goals/Self-Care Activities Over the next 30 days, patient will:  -Attends all scheduled provider appointments Calls pharmacy for medication refills Calls provider office for new concerns or questions  Follow Up Plan: The Managed Medicaid care management team will reach out to the patient again over the next 30 days.  The patient has been provided with contact information for the Managed Medicaid care management team and has been advised to call with any health related questions or concerns.     Follow Up:  Patient agrees to Care Plan and Follow-up.  Plan: The Managed Medicaid care management team will  reach out to the patient again over the next 30 days. and The patient has been provided with contact information for the Managed Medicaid care management team and has been advised to call with any health related questions or concerns.  Date/time of next scheduled RN care management/care coordination outreach:  10/06/20 at 1030.

## 2020-09-14 ENCOUNTER — Other Ambulatory Visit: Payer: Self-pay | Admitting: Allergy and Immunology

## 2020-09-14 ENCOUNTER — Other Ambulatory Visit: Payer: Self-pay | Admitting: Internal Medicine

## 2020-09-14 ENCOUNTER — Other Ambulatory Visit: Payer: Self-pay | Admitting: Student

## 2020-09-14 DIAGNOSIS — I10 Essential (primary) hypertension: Secondary | ICD-10-CM

## 2020-09-14 NOTE — Telephone Encounter (Signed)
Refill Request-  Pt was instructed to call her PCP per her pharmacy for her refill for the following medication:   metoprolol tartrate (LOPRESSOR) 25 MG tablet  [place], [place] (Ph: [phone])

## 2020-09-15 ENCOUNTER — Other Ambulatory Visit: Payer: Self-pay | Admitting: Allergy and Immunology

## 2020-09-15 NOTE — Telephone Encounter (Signed)
Patient called stating she had spoken to pharmacy but they did not have prescriptions for her. Patient is requesting refill for breztri and her proair. [place] ([place], [place] [address])  Best contact number for patient is : [phone]  Please advise.

## 2020-09-15 NOTE — Telephone Encounter (Signed)
Called pharmacy and verified Breztri and Albuterol were there at the pharmacy and she confirmed. I then called the patient and informed her that both medications were at the pharmacy, the patient is calling to make sure they were going to deliver them.

## 2020-09-16 ENCOUNTER — Telehealth: Payer: Self-pay

## 2020-09-16 NOTE — Telephone Encounter (Signed)
  metoprolol tartrate (LOPRESSOR) 25 MG tablet, refill request @  [place], [place] Phone:  [phone]  Fax:  [phone]

## 2020-09-16 NOTE — Telephone Encounter (Signed)
Metoprolol was refilled yesterday. Called pt - no answer, left message on pt's vm to call the pharmacy.

## 2020-10-02 ENCOUNTER — Encounter: Payer: Self-pay | Admitting: Physical Medicine & Rehabilitation

## 2020-10-02 ENCOUNTER — Other Ambulatory Visit: Payer: Self-pay

## 2020-10-02 ENCOUNTER — Encounter: Payer: Medicaid Other | Attending: Physical Medicine & Rehabilitation | Admitting: Physical Medicine & Rehabilitation

## 2020-10-02 VITALS — BP 136/88 | HR 85 | Temp 98.1°F | Ht 61.0 in | Wt 179.2 lb

## 2020-10-02 DIAGNOSIS — M533 Sacrococcygeal disorders, not elsewhere classified: Secondary | ICD-10-CM | POA: Diagnosis not present

## 2020-10-02 NOTE — Patient Instructions (Signed)
Sacroiliac injection was performed today. A combination of numbing medicine (lidocaine) plus a cortisone medicine (betamethasone) was injected. The injection was done under x-ray guidance. This procedure has been performed to help reduce low back and buttocks pain as well as potentially hip pain. The duration of this injection is variable lasting from hours to  Months. It may repeated if needed. 

## 2020-10-02 NOTE — Progress Notes (Signed)
Bilateral sacroiliac injections under fluoroscopic guidance  Indication: Low back and buttocks pain not relieved by medication management and other conservative care.Has had failure of PT and OTC meds.  Hx HIV, Last CD4 05/21/20 was 553/uL (>400)  Informed consent was obtained after describing risks and benefits of the procedure with the patient, this includes bleeding, bruising, infection, paralysis and medication side effects. The patient wishes to proceed and has given written consent. The patient was placed in a prone position. The lumbar and sacral area was marked and prepped with Betadine. A 25-gauge 1-1/2 inch needle was inserted into the skin and subcutaneous tissue and 1 mL of 1% lidocaine was injected into each side. Then a 25-gauge 3 inch spinal needle was inserted under fluoroscopic guidance into the left sacroiliac joint. AP and lateral images were utilized. Omnipaque 180x0.5 mL under live fluoroscopy demonstrated no intravascular uptake. Then a solution containing one ML of 6 mg per mL Celestone in 2 ML of 2% lidocaine MPF was injected x1.5 mL. This same procedure was repeated on the right side using the same needle, injectate, and technique. Patient tolerated the procedure well. Post procedure instructions were given. Please see post procedure form.

## 2020-10-02 NOTE — Progress Notes (Signed)
  PROCEDURE RECORD [facility]   Name: [name] [name] DOB:Jul 26, 1960 MRN: [id number]  Date:10/02/2020  Physician: Alysia Penna, MD    Nurse/CMA: Geryl Rankins  CMA  Allergies:  Allergies  Allergen Reactions   Acetaminophen Other (See Comments)    Inflamed liver, hospitalized REACTION: Had liver problems, was hospitalized Inflamed liver, hospitalized Other reaction(s): liver demage Other reaction(s): liver demage Other reaction(s): liver demage Other reaction(s): liver demage Other reaction(s): liver demage Other reaction(s): liver demage Other reaction(s): liver demage   Morphine Hives and Rash    REACTION: rash   Triamterene Hives   Aspirin-Caffeine     Other reaction(s): liver demage Other reaction(s): liver demage Other reaction(s): liver demage   Citalopram     Other reaction(s): INCREASED ANXIETY Other reaction(s): INCREASED ANXIETY   Citalopram Hydrobromide     Other reaction(s): INCREASED ANXIETY   Dyazide [Hydrochlorothiazide W-Triamterene] Hives   Empagliflozin     Other reaction(s): stomach ache, diarrhea Other reaction(s): stomach ache, diarrhea Other reaction(s): stomach ache, diarrhea   Emtricitabine-Tenofovir Df     Other reaction(s): rash Other reaction(s): rash Other reaction(s): rash   Lisinopril     Cough    Metformin Hcl Er     Other reaction(s): upset stomach Other reaction(s): upset stomach   Morphine Sulfate     Other reaction(s): shortness of breath and hives Other reaction(s): shortness of breath and hives Other reaction(s): shortness of breath and hives   Topamax [Topiramate]    Triamterene-Hctz     Other reaction(s): rash Other reaction(s): rash Other reaction(s): rash   Losartan Rash    Pt had rash, worsening dizziness, and nausea after starting losartan, which improved after stopping losartan    Consent Signed: Yes.    Is patient diabetic? Yes.    CBG today? 124 on yesterday (only  checked weekly)  Pregnant: No. LMP: No LMP recorded. Patient is postmenopausal. (age 45-55)  Anticoagulants: no Anti-inflammatory: no Antibiotics: no  Procedure: Bilateral sacroiliac injections   Position: Prone Start Time: 11:30am  End Time:11:35am Fluoro Time: 18s  RN/CMA Jerline Pain MA [place] CMA    Time 10:57 AM 11:40am    BP 136/88 157/92    Pulse 85 91    Respirations 16 16    O2 Sat 97 98    S/S 6 6    Pain Level 10/10 8/10     D/C home with Kendal Hymen, [name] & O X 3, D/C instructions reviewed, and sits independently.

## 2020-10-06 ENCOUNTER — Other Ambulatory Visit: Payer: Self-pay | Admitting: Obstetrics and Gynecology

## 2020-10-06 ENCOUNTER — Other Ambulatory Visit: Payer: Self-pay

## 2020-10-06 NOTE — Patient Instructions (Signed)
Hi [name] [name], thank you for speaking with me today-I am glad you are doing well.  [name] was given information about Medicaid Managed Care team care coordination services as a part of their [place] Medicaid benefit. [name] [name] verbally consented to engagement with the [facility] Managed Care team.   For questions related to your [facility], please call: [phone] or visit the homepage here: [email or web address]  If you would like to schedule transportation through your [facility], please call the following number at least 2 days in advance of your appointment: [phone].   Call the [place] [facility] at [phone], at any time, 24 hours a day, 7 days a week. If you are in danger or need immediate medical attention call 911.  [name] [name] - following are the goals we discussed in your visit today:   Goals Addressed             This Visit's Progress    Blood Pressure < 140/90       Protect My Health       Timeframe:  Long-Range Goal Priority:  Medium Start Date:      05/07/20                       Expected End Date:      12/10/20               Follow Up Date 11/09/20   - schedule appointment for vaccines needed due to my age or health - schedule recommended health tests (blood work, mammogram, colonoscopy, pap test) - schedule and keep appointment for annual check-up    Why is this important?   Screening tests can find diseases early when they are easier to treat.  Your doctor or nurse will talk with you about which tests are important for you.  Getting shots for common diseases like the flu and shingles will help prevent them.     Update 06/30/20:  Patient has appointment with PCP 07/07/20. Update 08/10/20:  Patient attending all appointments. Update 09/09/20:  Patient with no complaints, attending all appointments. Update  10/10/20:Patient doing well today, attending grief counseling as needed.     Patient verbalizes understanding of instructions provided today.   The Managed Medicaid care management team will reach out to the patient again over the next 30 days.  The patient has been provided with contact information for the Managed Medicaid care management team and has been advised to call with any health related questions or concerns.   [name] [name]  [facility] - Managed Medicaid High Risk [phone].    Following is a copy of your plan of care:    Patient Care Plan: General Plan of Care (Adult)     Problem Identified: Health Promotion or Disease Self-Management (General Plan of Care)   Priority: Medium  Onset Date: 08/10/2020     Long-Range Goal: Self-Management Plan Developed   Start Date: 05/07/2020  Expected End Date: 11/09/2020  This Visit's Progress: On track  Recent Progress: Not on track  Priority: Medium  Note:   Current Barriers:  Chronic Disease Management support and education needs. Patient helping to provide care to brother. This causes patient a lot of stress and anxiety.  She has recently started taking Wellbutrin which she states is helping.  Patient declines any therapy services at this time. Update 06/30/20: Patient's brother  died 06/15/20.  Patient is considering grief counseling with Hospice, declines SW referral at this time. Update 08/10/20:  Patient states she is doing better-doesn't feel like she needs anything right now. Update 09/09/20:  Patient without complaint today.  Breathing and itching improved with medication adjustment. Update 10/06/20:  Patient doing well, no complaints today, attending grief counseling as needed with Hospice.  Nurse Case Manager Clinical Goal(s):  Over the next 30 days, patient will attend all scheduled medical appointments. Over the next 30 days, patient will work with CM team  pharmacist to review medications. Update 06/02/20:  Patient met with Pharmacist 05/11/20 and has follow up scheduled. Update 06/30/20:  Patient continues to work with Pharmacist.  Interventions:  Inter-disciplinary care team collaboration (see longitudinal plan of care) Evaluation of current treatment plan and patient's adherence to plan as established by provider. Reviewed medications with patient. Collaborated with pharmacy regarding medications. Discussed plans with patient for ongoing care management follow up and provided patient with direct contact information for care management team Reviewed scheduled/upcoming provider appointments. Update 06/30/20:  Patient has appointment with PCP 07/07/20. Update 09/09/20:  Patient attending all scheduled appointments. Pharmacy referral for medication review-completed.  Patient Goals/Self-Care Activities Over the next 30 days, patient will:  -Attends all scheduled provider appointments Calls pharmacy for medication refills Calls provider office for new concerns or questions  Follow Up Plan: The Managed Medicaid care management team will reach out to the patient again over the next 30 days.  The patient has been provided with contact information for the Managed Medicaid care management team and has been advised to call with any health related questions or concerns.

## 2020-10-06 NOTE — Patient Outreach (Signed)
Medicaid Managed Care   Nurse Care Manager Note  10/06/2020 Name:  [name] [name] MRN:  [id number] DOB:  02-04-1961  [name] [name] is an 59 y.o. year old female who is a primary patient of [name], MD.  The [facility] Managed Care Coordination team was consulted for assistance with:    Chronic healthcare management needs.  Ms. Sannes was given information about Medicaid Managed Care Coordination team services today. [name] [name] agreed to services and verbal consent obtained.  Engaged with patient by telephone for follow up visit in response to provider referral for case management and/or care coordination services.   Assessments/Interventions:  Review of past medical history, allergies, medications, health status, including review of consultants reports, laboratory and other test data, was performed as part of comprehensive evaluation and provision of chronic care management services.  SDOH (Social Determinants of Health) assessments and interventions performed:   Care Plan  Allergies  Allergen Reactions   Acetaminophen Other (See Comments)    Inflamed liver, hospitalized REACTION: Had liver problems, was hospitalized Inflamed liver, hospitalized Other reaction(s): liver demage Other reaction(s): liver demage Other reaction(s): liver demage Other reaction(s): liver demage Other reaction(s): liver demage Other reaction(s): liver demage Other reaction(s): liver demage   Morphine Hives and Rash    REACTION: rash   Triamterene Hives   Aspirin-Caffeine     Other reaction(s): liver demage Other reaction(s): liver demage Other reaction(s): liver demage   Citalopram     Other reaction(s): INCREASED ANXIETY Other reaction(s): INCREASED ANXIETY   Citalopram Hydrobromide     Other reaction(s): INCREASED ANXIETY   Dyazide [Hydrochlorothiazide W-Triamterene] Hives   Empagliflozin     Other reaction(s): stomach ache, diarrhea Other reaction(s): stomach ache, diarrhea Other  reaction(s): stomach ache, diarrhea   Emtricitabine-Tenofovir Df     Other reaction(s): rash Other reaction(s): rash Other reaction(s): rash   Lisinopril     Cough    Metformin Hcl Er     Other reaction(s): upset stomach Other reaction(s): upset stomach   Morphine Sulfate     Other reaction(s): shortness of breath and hives Other reaction(s): shortness of breath and hives Other reaction(s): shortness of breath and hives   Topamax [Topiramate]    Triamterene-Hctz     Other reaction(s): rash Other reaction(s): rash Other reaction(s): rash   Losartan Rash    Pt had rash, worsening dizziness, and nausea after starting losartan, which improved after stopping losartan    Medications Reviewed Today     Reviewed by Gayla Medicus, RN (Registered Nurse) on 10/06/20 at [place] List Status: <None>   Medication Order Taking? Sig Documenting Provider Last Dose Status Informant  amLODipine (NORVASC) 10 MG tablet [id number] No Take 1 tablet (10 mg total) by mouth daily. [name], MD Taking Active   atorvastatin (LIPITOR) 10 MG tablet [id number] No TAKE HALF TABLET BY MOUTH DAILY Katsadouros, Vasilios, MD Taking Active   bictegravir-emtricitabine-tenofovir AF (BIKTARVY) 50-200-25 MG TABS tablet [id number] No Take 1 tablet by mouth daily. [place] Callas, NP Taking Active   Budeson-Glycopyrrol-Formoterol (BREZTRI AEROSPHERE) 160-9-4.8 MCG/ACT AERO [id number] No Inhale 2 puffs into the lungs in the morning and at bedtime. On empty lungs Kozlow, Donnamarie Poag, MD Taking Active   buPROPion (WELLBUTRIN XL) 150 MG 24 hr tablet [id number] No Take one tablet in the morning [name], MD Taking Active            Med Note Bonnita Levan May 11, 2020 11:34 AM) Making  her dizzy, will do trial at HS  cetirizine (ZYRTEC) 10 MG tablet [id number] No 1 tablet daily Kozlow, Donnamarie Poag, MD Taking Active   cholecalciferol (VITAMIN D) 25 MCG (1000 UNIT) tablet [id number] No TAKE ONE TABLET BY MOUTH  DAILY Jose Persia, MD Taking Active   Elastic Bandages & Supports (WRIST/THUMB SPLINT/RIGHT MED) MISC [id number] No Wear on R thumb/wrist. Ladona Horns, MD Taking Active   famotidine (PEPCID) 40 MG tablet [id number] No Take 1 tablet (40 mg total) by mouth at bedtime. Kozlow, Donnamarie Poag, MD Taking Active   gabapentin (NEURONTIN) 100 MG capsule [id number] No Take 1 capsule (100 mg total) by mouth 3 (three) times daily. Kozlow, Donnamarie Poag, MD Taking Active   hydrocortisone (CORTEF) 10 MG tablet [id number] No Take 10 mg by mouth as directed. Pt takes 10 mg in the am and 5 mg at 11 am and 3 pm [provider] Taking Active            Med Note Bonnita Levan May 11, 2020 11:25 AM) Only taking BID, morning and noon. Taking in evening makes her gain too much weight  liraglutide (VICTOZA) 18 MG/3ML SOPN [id number] No Inject into the skin. [provider] Taking Active   metoprolol tartrate (LOPRESSOR) 25 MG tablet [id number] No TAKE 1 TABLET (25 MG TOTAL) BY MOUTH TWO (TWO) TIMES DAILY. Jose Persia, MD Taking Active   montelukast (SINGULAIR) 10 MG tablet [id number] No Take 1 tablet (10 mg total) by mouth at bedtime. Kozlow, Donnamarie Poag, MD Taking Active   nitroGLYCERIN (NITROSTAT) 0.4 MG SL tablet [id number] No Place 1 tablet (0.4 mg total) under the tongue every 5 (five) minutes as needed for chest pain. Isaiah Serge, NP Taking Active   nortriptyline (PAMELOR) 10 MG capsule [id number] No Take 2 capsules (20 mg total) by mouth at bedtime. Suzzanne Cloud, NP Taking Active   pantoprazole (PROTONIX) 40 MG tablet [id number] No Take 1 tablet (40 mg total) by mouth 2 (two) times daily. [name], MD Taking Expired 10/05/20 2359   potassium chloride SA (KLOR-CON) 20 MEQ tablet [id number] No Take 20 mEq by mouth daily. [provider] Taking Active   PROAIR HFA 108 [phone] Base) MCG/ACT inhaler [id number] No INHALE 1-2 PUFFS BY MOUTH INTO THE LUNGS EVERY 4 HOURS AS NEEDED FOR WHEEZING OR  SHORTNESS OF BREATH Kozlow, Donnamarie Poag, MD Taking Active   promethazine (PHENERGAN) 25 MG tablet [id number] No Take 1 tablet (25 mg total) by mouth every 6 (six) hours as needed for nausea or vomiting. [name], DO Taking Active   sodium chloride (OCEAN) 0.65 % SOLN nasal spray [id number] No PLACE 1 SPRAY INTO BOTH NOSTRILS AS NEEDED FOR CONGESTION. [name], DO Taking Active   triamcinolone (NASACORT) 55 MCG/ACT AERO nasal inhaler [id number] No 1 spray each nostril 2 times per day Jiles Prows, MD Taking Active             Patient Active Problem List   Diagnosis Date Noted   Left leg swelling 07/09/2020   Grief reaction 06/08/2020   Arm pain 02/10/2020   Dizziness on standing 12/27/2019   Generalized anxiety disorder with panic attacks 06/20/2019   Osteoarthritis of thumb, right 11/29/2018   Chronic migraine w/o aura w/o status migrainosus, not intractable 08/27/2018   Disorder of left eustachian tube 05/19/2017   Vertigo of central origin 01/23/2017   Central perforation of tympanic membrane of left ear 12/09/2016   Eustachian tube dysfunction,  bilateral 06/23/16   Ear pain, right 06-23-2016   Spondylosis of lumbar region without myelopathy or radiculopathy 10/08/2015   BPPV (benign paroxysmal positional vertigo) 10/02/2015   Liver fibrosis ([place]) 12/04/2014   Pituitary microadenoma ([place]) 08/08/2014   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) 06/29/2014   History of tympanostomy tube placement 02/07/2014   Refusal of blood transfusions as patient is Jehovah's Witness 11/18/2013   Hepatitis C virus infection cured after antiviral drug therapy 03/16/2012   Health care maintenance 12/15/2011   Opioid dependence ([place]) 10/05/2010   HTN (hypertension) 10/05/2010   Hyperlipidemia associated with type 2 diabetes mellitus ([place]) 10/23/2008   HIV disease ([place]) 05/09/2006    Depression 05/09/2006   Allergic rhinitis 05/09/2006   GERD 05/09/2006    Conditions to be addressed/monitored per PCP order:  chronic healthcare management needs, HIV, HTN, asthma, DM   Care Plan : General Plan of Care (Adult)  Updates made by Gayla Medicus, RN since 10/06/2020 12:00 AM     Problem: Health Promotion or Disease Self-Management (General Plan of Care)   Priority: Medium  Onset Date: 08/10/2020     Long-Range Goal: Self-Management Plan Developed   Start Date: 05/07/2020  Expected End Date: 11/09/2020  This Visit's Progress: On track  Recent Progress: Not on track  Priority: Medium  Note:   Current Barriers:  Chronic Disease Management support and education needs. Patient helping to provide care to brother. This causes patient a lot of stress and anxiety.  She has recently started taking Wellbutrin which she states is helping.  Patient declines any therapy services at this time. Update 2020-07-08: Patient's brother died 06-23-20.  Patient is considering grief counseling with Hospice, declines SW referral at this time. Update 08/10/20:  Patient states she is doing better-doesn't feel like she needs anything right now. Update 09/09/20:  Patient without complaint today.  Breathing and itching improved with medication adjustment. Update 10/06/20:  Patient doing well, no complaints today, attending grief counseling as needed with Hospice.  Nurse Case Manager Clinical Goal(s):  Over the next 30 days, patient will attend all scheduled medical appointments. Over the next 30 days, patient will work with CM team pharmacist to review medications. Update 06/02/20:  Patient met with Pharmacist 05/11/20 and has follow up scheduled. Update Jul 08, 2020:  Patient continues to work with Pharmacist.  Interventions:  Inter-disciplinary care team collaboration (see longitudinal plan of care) Evaluation of current treatment plan and patient's adherence to plan as established by provider. Reviewed  medications with patient. Collaborated with pharmacy regarding medications. Discussed plans with patient for ongoing care management follow up and provided patient with direct contact information for care management team Reviewed scheduled/upcoming provider appointments. Update Jul 08, 2020:  Patient has appointment with PCP 07/07/20. Update 09/09/20:  Patient attending all scheduled appointments. Pharmacy referral for medication review-completed.  Patient Goals/Self-Care Activities Over the next 30 days, patient will:  -Attends all scheduled provider appointments Calls pharmacy for medication refills Calls provider office for new concerns or questions  Follow Up Plan: The Managed Medicaid care management team will reach out to the patient again over the next 30 days.  The patient has been provided with contact information for the Managed Medicaid care management team and has been advised to call with any health related questions or concerns.     Follow Up:  Patient agrees to Care Plan and Follow-up.  Plan: The Managed Medicaid care management team will reach out to  the patient again over the next 30 days. and The patient has been provided with contact information for the Managed Medicaid care management team and has been advised to call with any health related questions or concerns.  Date/time of next scheduled RN care management/care coordination outreach: 10/06/20 at 1145.

## 2020-10-12 ENCOUNTER — Other Ambulatory Visit: Payer: Self-pay | Admitting: Internal Medicine

## 2020-10-12 DIAGNOSIS — I10 Essential (primary) hypertension: Secondary | ICD-10-CM

## 2020-10-13 NOTE — Telephone Encounter (Signed)
metoprolol tartrate (LOPRESSOR) 25 MG tablet, REFILL REQUEST @  [facility], [place] Phone:  [phone]  Fax:  [phone]

## 2020-10-27 ENCOUNTER — Encounter: Payer: Self-pay | Admitting: *Deleted

## 2020-10-27 ENCOUNTER — Telehealth: Payer: Self-pay

## 2020-10-27 NOTE — Telephone Encounter (Signed)
Pt Is requesting a call back ... PT stated that her (left)  leg has been swelling she thinks it has to do with her medicine .. pt stated that she cant come today due she has no ride

## 2020-10-27 NOTE — Telephone Encounter (Signed)
RTC, patient c/o left leg swelling.  She was seen in clinic on 07/07/20 for this issue and states she was told to wear compression stockings and if the swelling returned, to RTC. She states the swelling did subside after she started wearing the compression stockings, but over the last "week or two", she has noticed swelling again.  She elevates the leg at night when sleeping and states there is no swelling upon awakening.  She does report slight pain in the leg when it is swollen.  She can't come in today for an appointment, she has transportation issues.  Appt made w/ pcp on 10/29/20 @ 1545.  She was instructed if swelling worsened, there was any color change, consistent warmth or increased pain in the leg to present to urgent care/ED and she verbalized understanding. SChaplin, RN,BSN

## 2020-10-29 ENCOUNTER — Other Ambulatory Visit: Payer: Self-pay

## 2020-10-29 ENCOUNTER — Encounter: Payer: Self-pay | Admitting: Student

## 2020-10-29 ENCOUNTER — Ambulatory Visit (INDEPENDENT_AMBULATORY_CARE_PROVIDER_SITE_OTHER): Payer: Medicaid Other | Admitting: Student

## 2020-10-29 DIAGNOSIS — M7989 Other specified soft tissue disorders: Secondary | ICD-10-CM | POA: Diagnosis not present

## 2020-10-29 NOTE — Patient Instructions (Addendum)
Thank you, Ms.[name] for allowing [place] to provide your care today. Today we discussed   Swelling in your leg I am glad to see that swelling in your leg has gotten better.  We do not believe that this is a blood clot or secondary to your heart failure.  This may have been due to a muscle strain.  If this does occur again please call our clinic and try to be seen that same day. I am glad to know that you are feeling better.  I have ordered the following labs for you:  Lab Orders  No laboratory test(s) ordered today     Referrals ordered today:   Referral Orders  No referral(s) requested today     I have ordered the following medication/changed the following medications:   Stop the following medications: There are no discontinued medications.   Start the following medications: No orders of the defined types were placed in this encounter.    Follow up: Follow-up as needed   Remember: If the swelling returns please call our clinic  Should you have any questions or concerns please call the internal medicine clinic at [phone].     Sanjuana Letters, D.O. [place]

## 2020-10-30 DIAGNOSIS — D352 Benign neoplasm of pituitary gland: Secondary | ICD-10-CM | POA: Diagnosis not present

## 2020-10-30 DIAGNOSIS — E119 Type 2 diabetes mellitus without complications: Secondary | ICD-10-CM | POA: Diagnosis not present

## 2020-10-30 DIAGNOSIS — E2749 Other adrenocortical insufficiency: Secondary | ICD-10-CM | POA: Diagnosis not present

## 2020-10-30 NOTE — Progress Notes (Signed)
CC: Left lower extremity swelling  HPI:  [name] is a 60 y.o. female with a past medical history stated below and presents today for left lower extremity swelling. Please see problem based assessment and plan for additional details.  Past Medical History:  Diagnosis Date   Allergic rhinitis 05/09/2006   Allergy    Anxiety    Arthritis    Asthma    CHF (congestive heart failure) (HCC)    Chronic back pain    Diabetes mellitus without complication (HCC)    GERD (gastroesophageal reflux disease)    Heart murmur    as a child   Hepatitis C    genotype 1b, 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 s/p 12 week course of simeprevir and sofosbuvir between October 2014 and January 2015 with resolution.   History of shingles    HIV infection ([place])    1994   Hyperlipidemia    no meds taken now   Hypertension    Migraine    Pituitary microadenoma ([place]) 08/08/2014   Prediabetes    Refusal of blood transfusions as patient is Jehovah's Witness    Secondary adrenal insufficiency ([place]) 06/29/2014   Urticaria     Current Outpatient Medications on File Prior to Visit  Medication Sig Dispense Refill   amLODipine (NORVASC) 10 MG tablet Take 1 tablet (10 mg total) by mouth daily. 90 tablet 1   atorvastatin (LIPITOR) 10 MG tablet TAKE HALF TABLET BY MOUTH DAILY 90 tablet 10   bictegravir-emtricitabine-tenofovir AF (BIKTARVY) 50-200-25 MG TABS tablet Take 1 tablet by mouth daily. 30 tablet 11   Budeson-Glycopyrrol-Formoterol (BREZTRI AEROSPHERE) 160-9-4.8 MCG/ACT AERO Inhale 2 puffs into the lungs in the morning and at bedtime. On empty lungs 10.7 g 5   buPROPion (WELLBUTRIN XL) 150 MG 24 hr tablet Take one tablet in the morning 180 tablet 3   cetirizine (ZYRTEC) 10 MG tablet 1 tablet daily 90 tablet 6   cholecalciferol (VITAMIN D) 25 MCG (1000 UNIT) tablet TAKE ONE TABLET BY MOUTH DAILY 90 tablet 0   Elastic Bandages & Supports (WRIST/THUMB SPLINT/RIGHT MED) MISC Wear on R  thumb/wrist. 1 each 0   famotidine (PEPCID) 40 MG tablet Take 1 tablet (40 mg total) by mouth at bedtime. 30 tablet 5   gabapentin (NEURONTIN) 100 MG capsule Take 1 capsule (100 mg total) by mouth 3 (three) times daily. 90 capsule 5   hydrocortisone (CORTEF) 10 MG tablet Take 10 mg by mouth as directed. Pt takes 10 mg in the am and 5 mg at 11 am and 3 pm     liraglutide (VICTOZA) 18 MG/3ML SOPN Inject into the skin.     metoprolol tartrate (LOPRESSOR) 25 MG tablet TAKE ONE TABLET BY MOUTH TWICE A DAY 60 tablet 0   montelukast (SINGULAIR) 10 MG tablet Take 1 tablet (10 mg total) by mouth at bedtime. 30 tablet 5   nitroGLYCERIN (NITROSTAT) 0.4 MG SL tablet Place 1 tablet (0.4 mg total) under the tongue every 5 (five) minutes as needed for chest pain. 25 tablet 1   nortriptyline (PAMELOR) 10 MG capsule Take 2 capsules (20 mg total) by mouth at bedtime. 180 capsule 4   pantoprazole (PROTONIX) 40 MG tablet Take 1 tablet (40 mg total) by mouth 2 (two) times daily. 60 tablet 2   potassium chloride SA (KLOR-CON) 20 MEQ tablet Take 20 mEq by mouth daily.     PROAIR HFA 108 (90 Base) MCG/ACT inhaler INHALE 1-2 PUFFS BY MOUTH  INTO THE LUNGS EVERY 4 HOURS AS NEEDED FOR WHEEZING OR SHORTNESS OF BREATH 8 g 1   promethazine (PHENERGAN) 25 MG tablet Take 1 tablet (25 mg total) by mouth every 6 (six) hours as needed for nausea or vomiting. 30 tablet 3   sodium chloride (OCEAN) 0.65 % SOLN nasal spray PLACE 1 SPRAY INTO BOTH NOSTRILS AS NEEDED FOR CONGESTION. 3 Bottle 0   triamcinolone (NASACORT) 55 MCG/ACT AERO nasal inhaler 1 spray each nostril 2 times per day 16.5 g 5   No current facility-administered medications on file prior to visit.    Family History  Problem Relation Age of Onset   Heart disease Mother    Diabetes Mother    Stroke Mother    Heart disease Father    Stroke Father    Diabetes Father    Hepatitis Sister        hcv   Asthma Sister    Allergic rhinitis Sister    Stroke Other     Colon polyps Brother    Renal Disease Brother    Cancer Sister        lung   Asthma Sister    Allergic rhinitis Sister    Cancer Maternal Aunt    Cancer Maternal Aunt    Colon cancer Neg Hx    Esophageal cancer Neg Hx    Stomach cancer Neg Hx    Rectal cancer Neg Hx    Angioedema Neg Hx    Eczema Neg Hx    Urticaria Neg Hx     Social History   Socioeconomic History   Marital status: Single    Spouse name: Not on file   Number of children: 0   Years of education: 12   Highest education level: High school graduate  Occupational History   Occupation: Disabled  Tobacco Use   Smoking status: Former    Years: 20.00    Pack years: 0.00    Types: Cigarettes    Quit date: 04/26/2007    Years since quitting: 13.5   Smokeless tobacco: Never   Tobacco comments:    QUIT 2009  Vaping Use   Vaping Use: Never used  Substance and Sexual Activity   Alcohol use: No    Alcohol/week: 0.0 standard drinks   Drug use: No   Sexual activity: Not Currently    Partners: Male    Birth control/protection: Condom    Comment: pt. given condoms  Other Topics Concern   Not on file  Social History Narrative   Lives at home alone.   Right-handed.   Occasional tea or soda.   Te   Social Determinants of Radio broadcast assistant Strain: Not on file  Food Insecurity: Not on file  Transportation Needs: Not on file  Physical Activity: Not on file  Stress: Not on file  Social Connections: Not on file  Intimate Partner Violence: Not on file   Review of Systems: ROS negative except for what is noted on the assessment and plan.  Vitals:   10/29/20 1551  BP: (!) 149/84  Pulse: 88  Temp: 98.7 F (37.1 C)  TempSrc: Oral  SpO2: 98%  Weight: 181 lb 12.8 oz (82.5 kg)  Height: 5\' 1"  (1.549 m)   Physical Exam: Constitutional: Well-appearing, no acute distress HENT: normocephalic atraumatic, mucous membranes moist Eyes: conjunctiva non-erythematous Neck: supple Cardiovascular: regular  rate and rhythm, no m/r/g. No lower extremity edema present. Distal pulses intact.  Pulmonary/Chest: normal work of breathing on room  air, lungs clear to auscultation bilaterally MSK: normal bulk and tone Neurological: alert & oriented x 3 Skin: warm and dry Psych: Normal mood and thought process   Assessment & Plan:   See Encounters Tab for problem based charting.  Patient seen with Dr. Delano Metz, D.O. [facility], PGY-2 Pager: [phone], Phone: [phone] Date 10/30/2020 Time 8:47 AM

## 2020-10-30 NOTE — Assessment & Plan Note (Addendum)
Assessment: Patient endorses left lower leg swelling.  She notes that this started 5 days ago and her lower leg was so swollen that she was unable to wear her normal shoes.  States that she had a pain in the entire leg as well.  States that it improved with elevating of her leg.  She still has some residual pain on the anterior medial aspect of the left lower leg.  Denies swelling of right leg.  Denies erythema.  Denies calf tenderness. Denies trauma to the area.   She states that she has had this before and it resolved on its own.  Does have a history of heart failure, denies shortness of breath or chest pain.  Bedside ultrasound performed, no obvious deformity noted.  No vasculature found in the area of pain.  Was noted to have some hypoechogenicity of the underlying muscle.  Because me suspicious for possible muscle strain.  Patient also endorsed some upper thigh pain initially which I suspect was compensatory muscle pain of her thigh from her lower leg swelling.  She did have a venous Doppler 2016 with evidence of enlarged left inguinal lymph node, but no DVTs or SVTs noted.  Do not suspect CHF exacerbation, DVT or superficial vein thrombosis at this time.  While this may be secondary to heart failure and venous insufficiency, beyond that she would have localized pain with these.  We will continue to monitor and patient was given strict return precautions if swelling returns.  Plan: -Continue to monitor -Elevate lower extremities -Return if swelling reoccurs

## 2020-11-05 ENCOUNTER — Other Ambulatory Visit: Payer: Self-pay | Admitting: Obstetrics and Gynecology

## 2020-11-05 ENCOUNTER — Other Ambulatory Visit: Payer: Self-pay

## 2020-11-05 NOTE — Patient Outreach (Signed)
Medicaid Managed Care   Nurse Care Manager Note  11/05/2020 Name:  [name] [name] MRN:  [id number] DOB:  [birth year]  [name] [name] is an 60 y.o. year old female who is a primary patient of [name], MD.  The [facility] Managed Care Coordination team was consulted for assistance with:    Chronic healthcare management notes.  [name] [name] was given information about Medicaid Managed Care Coordination team services today. [name] [name] agreed to services and verbal consent obtained.  Engaged with patient by telephone for follow up visit in response to provider referral for case management and/or care coordination services.   Assessments/Interventions:  Review of past medical history, allergies, medications, health status, including review of consultants reports, laboratory and other test data, was performed as part of comprehensive evaluation and provision of chronic care management services.  SDOH (Social Determinants of Health) assessments and interventions performed:   Care Plan  Allergies  Allergen Reactions   Acetaminophen Other (See Comments)    Inflamed liver, hospitalized REACTION: Had liver problems, was hospitalized Inflamed liver, hospitalized Other reaction(s): liver demage Other reaction(s): liver demage Other reaction(s): liver demage Other reaction(s): liver demage Other reaction(s): liver demage Other reaction(s): liver demage Other reaction(s): liver demage   Morphine Hives and Rash    REACTION: rash   Triamterene Hives   Aspirin-Caffeine     Other reaction(s): liver demage Other reaction(s): liver demage Other reaction(s): liver demage   Citalopram     Other reaction(s): INCREASED ANXIETY Other reaction(s): INCREASED ANXIETY   Citalopram Hydrobromide     Other reaction(s): INCREASED ANXIETY   Dyazide [Hydrochlorothiazide W-Triamterene] Hives   Empagliflozin     Other reaction(s): stomach ache, diarrhea Other reaction(s): stomach ache, diarrhea Other  reaction(s): stomach ache, diarrhea   Emtricitabine-Tenofovir Df     Other reaction(s): rash Other reaction(s): rash Other reaction(s): rash   Lisinopril     Cough    Metformin Hcl Er     Other reaction(s): upset stomach Other reaction(s): upset stomach   Morphine Sulfate     Other reaction(s): shortness of breath and hives Other reaction(s): shortness of breath and hives Other reaction(s): shortness of breath and hives   Topamax [Topiramate]    Triamterene-Hctz     Other reaction(s): rash Other reaction(s): rash Other reaction(s): rash   Losartan Rash    Pt had rash, worsening dizziness, and nausea after starting losartan, which improved after stopping losartan    Medications Reviewed Today     Reviewed by Gayla Medicus, RN (Registered Nurse) on 11/05/20 at 1124  Med List Status: <None>   Medication Order Taking? Sig Documenting Provider Last Dose Status Informant  amLODipine (NORVASC) 10 MG tablet [id number] No Take 1 tablet (10 mg total) by mouth daily. [name], MD Taking Active   atorvastatin (LIPITOR) 10 MG tablet [id number] No TAKE HALF TABLET BY MOUTH DAILY Katsadouros, Vasilios, MD Taking Active   bictegravir-emtricitabine-tenofovir AF (BIKTARVY) 50-200-25 MG TABS tablet [id number] No Take 1 tablet by mouth daily. [place] Callas, NP Taking Active   Budeson-Glycopyrrol-Formoterol (BREZTRI AEROSPHERE) 160-9-4.8 MCG/ACT AERO [id number] No Inhale 2 puffs into the lungs in the morning and at bedtime. On empty lungs Kozlow, Donnamarie Poag, MD Taking Active   buPROPion (WELLBUTRIN XL) 150 MG 24 hr tablet [id number] No Take one tablet in the morning [name], MD Taking Active            Med Note Bonnita Levan May 11, 2020 11:34 AM) Making  her dizzy, will do trial at HS  cetirizine (ZYRTEC) 10 MG tablet [id number] No 1 tablet daily Kozlow, Donnamarie Poag, MD Taking Active   cholecalciferol (VITAMIN D) 25 MCG (1000 UNIT) tablet [id number] No TAKE ONE TABLET BY MOUTH  DAILY Jose Persia, MD Taking Active   Elastic Bandages & Supports (WRIST/THUMB SPLINT/RIGHT MED) MISC [id number] No Wear on R thumb/wrist. Ladona Horns, MD Taking Active   famotidine (PEPCID) 40 MG tablet [id number] No Take 1 tablet (40 mg total) by mouth at bedtime. Kozlow, Donnamarie Poag, MD Taking Active   gabapentin (NEURONTIN) 100 MG capsule [id number] No Take 1 capsule (100 mg total) by mouth 3 (three) times daily. Kozlow, Donnamarie Poag, MD Taking Active   hydrocortisone (CORTEF) 10 MG tablet [id number] No Take 10 mg by mouth as directed. Pt takes 10 mg in the am and 5 mg at 11 am and 3 pm [provider] Taking Active            Med Note Bonnita Levan May 11, 2020 11:25 AM) Only taking BID, morning and noon. Taking in evening makes her gain too much weight  liraglutide (VICTOZA) 18 MG/3ML SOPN [id number] No Inject into the skin. [provider] Taking Active   metoprolol tartrate (LOPRESSOR) 25 MG tablet [id number]  TAKE ONE TABLET BY MOUTH TWICE A Harlow Mares, MD  Active   montelukast (SINGULAIR) 10 MG tablet [id number] No Take 1 tablet (10 mg total) by mouth at bedtime. Kozlow, Donnamarie Poag, MD Taking Active   nitroGLYCERIN (NITROSTAT) 0.4 MG SL tablet [id number] No Place 1 tablet (0.4 mg total) under the tongue every 5 (five) minutes as needed for chest pain. Isaiah Serge, NP Taking Active   nortriptyline (PAMELOR) 10 MG capsule [id number] No Take 2 capsules (20 mg total) by mouth at bedtime. Suzzanne Cloud, NP Taking Active   pantoprazole (PROTONIX) 40 MG tablet [id number] No Take 1 tablet (40 mg total) by mouth 2 (two) times daily. [name], MD Taking Expired 10/05/20 2359   potassium chloride SA (KLOR-CON) 20 MEQ tablet [id number] No Take 20 mEq by mouth daily. [provider] Taking Active   PROAIR HFA [phone] Base) MCG/ACT inhaler [id number] No INHALE 1-2 PUFFS BY MOUTH INTO THE LUNGS EVERY 4 HOURS AS NEEDED FOR WHEEZING OR SHORTNESS OF BREATH Kozlow, Donnamarie Poag, MD Taking Active   promethazine (PHENERGAN) 25 MG tablet [id number] No Take 1 tablet (25 mg total) by mouth every 6 (six) hours as needed for nausea or vomiting. [name], DO Taking Active   sodium chloride (OCEAN) 0.65 % SOLN nasal spray [id number] No PLACE 1 SPRAY INTO BOTH NOSTRILS AS NEEDED FOR CONGESTION. [name], DO Taking Active   triamcinolone (NASACORT) 55 MCG/ACT AERO nasal inhaler [id number] No 1 spray each nostril 2 times per day Jiles Prows, MD Taking Active             Patient Active Problem List   Diagnosis Date Noted   Left leg swelling 07/09/2020   Grief reaction 06/08/2020   Arm pain 02/10/2020   Dizziness on standing 12/27/2019   Generalized anxiety disorder with panic attacks 06/20/2019   Osteoarthritis of thumb, right 11/29/2018   Chronic migraine w/o aura w/o status migrainosus, not intractable 08/27/2018   Disorder of left eustachian tube 05/19/2017   Vertigo of central origin 01/23/2017   Central perforation of tympanic membrane of left ear 12/09/2016   Eustachian tube dysfunction, bilateral 06/15/2016  Ear pain, right Jun 16, 2016   Spondylosis of lumbar region without myelopathy or radiculopathy 10/08/2015   BPPV (benign paroxysmal positional vertigo) 10/02/2015   Liver fibrosis ([place]) 12/04/2014   Pituitary microadenoma ([place]) 08/08/2014   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   Vitamin D deficiency 06/29/2014   Secondary adrenal insufficiency ([place]) 06/29/2014   History of tympanostomy tube placement 02/07/2014   Refusal of blood transfusions as patient is Jehovah's Witness 11/18/2013   Hepatitis C virus infection cured after antiviral drug therapy 03/16/2012   Health care maintenance 12/15/2011   Opioid dependence ([place]) 10/05/2010   HTN (hypertension) 10/05/2010   Hyperlipidemia associated with type 2 diabetes mellitus ([place]) 10/23/2008   HIV disease ([place]) 05/09/2006   Depression 05/09/2006   Allergic  rhinitis 05/09/2006   GERD 05/09/2006    Conditions to be addressed/monitored per PCP order:   chronic healthcare management needs, HIV, H/A, HTN, DM2, asthma, anxiety, Addison's disease, GERD, CKD, osteoarthritis.  Care Plan : General Plan of Care (Adult)  Updates made by Gayla Medicus, RN since 11/05/2020 12:00 AM     Problem: Health Promotion or Disease Self-Management (General Plan of Care)   Priority: Medium  Onset Date: 08/10/2020     Long-Range Goal: Self-Management Plan Developed   Start Date: 05/07/2020  Expected End Date: 02/05/2021  Recent Progress: On track  Priority: Medium  Note:   Current Barriers:  Chronic Disease Management support and education needs.  Patient with increased anxiety and wants to increase Wellbutrin dose.  Patient with an increase in left leg swelling-saw PCP-wearing compression stockings and elevating which helps. Patient helping to provide care to brother. This causes patient a lot of stress and anxiety.  She has recently started taking Wellbutrin which she states is helping.  Patient declines any therapy services at this time. Update 07-01-20: Patient's brother died Jun 16, 2020.  Patient is considering grief counseling with Hospice, declines SW referral at this time. Update 08/10/20:  Patient states she is doing better-doesn't feel like she needs anything right now. Update 09/09/20:  Patient without complaint today.  Breathing and itching improved with medication adjustment. Update 10/06/20:  Patient doing well, no complaints today, attending grief counseling as needed with Hospice. Update 11/05/20:  Patient speaking with grief counselor two times a month.  Nurse Case Manager Clinical Goal(s):  Over the next 30 days, patient will attend all scheduled medical appointments. Over the next 30 days, patient will work with CM team pharmacist to review medications. Update 06/02/20:  Patient met with Pharmacist 05/11/20 and has follow up scheduled. Update 2020-07-01:   Patient continues to work with Pharmacist.  Interventions:  Inter-disciplinary care team collaboration (see longitudinal plan of care) Evaluation of current treatment plan and patient's adherence to plan as established by provider. Reviewed medications with patient. Collaborated with pharmacy regarding medications. Discussed plans with patient for ongoing care management follow up and provided patient with direct contact information for care management team Reviewed scheduled/upcoming provider appointments. Update 2020-07-01:  Patient has appointment with PCP 07/07/20. Update 09/09/20:  Patient attending all scheduled appointments. Update 11/05/20:  Patient will contact PCP regarding increasing Wellbutrin dose. Pharmacy referral for medication review-completed.  Patient Goals/Self-Care Activities Over the next 30 days, patient will:  -Attends all scheduled provider appointments Calls pharmacy for medication refills Calls provider office for new concerns or questions  Follow Up Plan: The Managed Medicaid care management team will reach out to the patient again over the next 30 days.  The patient has been provided with contact information  for the Managed Medicaid care management team and has been advised to call with any health related questions or concerns.    Follow Up:  Patient agrees to Care Plan and Follow-up.  Plan: The Managed Medicaid care management team will reach out to the patient again over the next 30 days. and The patient has been provided with contact information for the Managed Medicaid care management team and has been advised to call with any health related questions or concerns.  Date/time of next scheduled RN care management/care coordination outreach: 12/02/20 at 1030.

## 2020-11-05 NOTE — Patient Instructions (Signed)
Hi [name] [name]-thank you for speaking with me today.  Have a great afternoon!  [name] [name] was given information about Medicaid Managed Care team care coordination services as a part of their [place] Medicaid benefit. [name] [name] verbally consented to engagement with the [facility] Managed Care team.   For questions related to your [facility], please call: [phone] or visit the homepage here: [email or web address]  If you would like to schedule transportation through your [facility], please call the following number at least 2 days in advance of your appointment: [phone].   Call the [facility] at [phone], at any time, 24 hours a day, 7 days a week. If you are in danger or need immediate medical attention call 911.  If you would like help to quit smoking, call 1-800-QUIT-NOW 813-833-1363) OR Espaol: 1-855-Djelo-Ya (9-169-450-3888) o para ms informacin haga clic aqu or Text READY to 200-400 to register via text  [name] [name] - following are the goals we discussed in your visit today:   Goals Addressed             This Visit's Progress    Protect My Health       Timeframe:  Long-Range Goal Priority:  Medium Start Date:      05/07/20                       Expected End Date: ongoing             Follow Up Date:  12/06/20   - schedule appointment for vaccines needed due to my age or health - schedule recommended health tests (blood work, mammogram, colonoscopy, pap test) - schedule and keep appointment for annual check-up    Why is this important?   Screening tests can find diseases early when they are easier to treat.  Your doctor or nurse will talk with you about which tests are important for you.  Getting shots for common diseases like the flu and shingles will help prevent them.     Update 06/30/20:  Patient has appointment  with PCP 07/07/20. Update 08/10/20:  Patient attending all appointments. Update 09/09/20:  Patient with no complaints, attending all appointments. Update 10/10/20:Patient doing well today, attending grief counseling as needed. Update 11/05/20:  Grief counseling twice a month, increased anxiety and wants to increase dose of Wellbutrin-patient will contact her PCP.        Patient verbalizes understanding of instructions provided today.   The Managed Medicaid care management team will reach out to the patient again over the next 30 days.  The  Patient has been provided with contact information for the Managed Medicaid care management team and has been advised to call with any health related questions or concerns.   [name] Raider RN, BSN [place]  [facility] - Managed Medicaid High Risk [phone].  Following is a copy of your plan of care:   Patient Care Plan: General Plan of Care (Adult)     Problem Identified: Health Promotion or Disease Self-Management (General Plan of Care)   Priority: Medium  Onset Date: 08/10/2020     Long-Range Goal: Self-Management Plan Developed   Start Date: 05/07/2020  Expected End Date: 02/05/2021  Recent Progress: On track  Priority: Medium  Note:   Current Barriers:  Chronic Disease Management support and education needs.  Patient with increased anxiety and wants to increase Wellbutrin dose.  Patient with an increase in left leg swelling-saw PCP-wearing compression stockings and elevating which helps. Patient helping to provide care to brother. This causes patient a lot of stress and anxiety.  She has recently started taking Wellbutrin which she states is helping.  Patient declines any therapy services at this time. Update 2020/07/14: Patient's brother died Jun 29, 2020.  Patient is considering grief counseling with Hospice, declines SW referral at this time. Update 08/10/20:  Patient states she is doing better-doesn't  feel like she needs anything right now. Update 09/09/20:  Patient without complaint today.  Breathing and itching improved with medication adjustment. Update 10/06/20:  Patient doing well, no complaints today, attending grief counseling as needed with Hospice. Update 11/05/20:  Patient speaking with grief counselor two times a month.  Nurse Case Manager Clinical Goal(s):  Over the next 30 days, patient will attend all scheduled medical appointments. Over the next 30 days, patient will work with CM team pharmacist to review medications. Update 06/02/20:  Patient met with Pharmacist 05/11/20 and has follow up scheduled. Update Jul 14, 2020:  Patient continues to work with Pharmacist.  Interventions:  Inter-disciplinary care team collaboration (see longitudinal plan of care) Evaluation of current treatment plan and patient's adherence to plan as established by provider. Reviewed medications with patient. Collaborated with pharmacy regarding medications. Discussed plans with patient for ongoing care management follow up and provided patient with direct contact information for care management team Reviewed scheduled/upcoming provider appointments. Update July 14, 2020:  Patient has appointment with PCP 07/07/20. Update 09/09/20:  Patient attending all scheduled appointments. Update 11/05/20:  Patient will contact PCP regarding increasing Wellbutrin dose. Pharmacy referral for medication review-completed.  Patient Goals/Self-Care Activities Over the next 30 days, patient will:  -Attends all scheduled provider appointments Calls pharmacy for medication refills Calls provider office for new concerns or questions  Follow Up Plan: The Managed Medicaid care management team will reach out to the patient again over the next 30 days.  The patient has been provided with contact information for the Managed Medicaid care management team and has been advised to call with any health related questions or concerns.

## 2020-11-06 ENCOUNTER — Telehealth: Payer: Self-pay

## 2020-11-06 NOTE — Telephone Encounter (Signed)
RTC, patient states she would like to discuss increasing her Wellbutrin.  States it is working and she does feel better, but wants to know if she can increase the dose, states she is under a lot of stress. Telehealth appt made w/ PCP on 11/11/20 @1015  to discuss. SChaplin, RN,BSN

## 2020-11-06 NOTE — Telephone Encounter (Signed)
Addressed in separate encounter from today. This encounter closed. SChaplin, RN,BSN

## 2020-11-06 NOTE — Telephone Encounter (Signed)
Pt would like an increase on  buPROPion (WELLBUTRIN XL) 150 MG 24 hr tablet. Please call pt back.

## 2020-11-09 ENCOUNTER — Other Ambulatory Visit: Payer: Self-pay

## 2020-11-09 NOTE — Progress Notes (Signed)
Internal Medicine Clinic Attending  I saw and evaluated the patient.  I personally confirmed the key portions of the history and exam documented by Dr. Katsadouros and I reviewed pertinent patient test results.  The assessment, diagnosis, and plan were formulated together and I agree with the documentation in the resident's note.  

## 2020-11-09 NOTE — Patient Outreach (Signed)
Medicaid Managed Care    Pharmacy Note  11/09/2020 Name: [name] [name] MRN: [id number] DOB: 1960-09-28  [name] [name] is a 60 y.o. year old female who is a primary care patient of [name] Dame, MD. The [facility] Managed Care Coordination team was consulted for assistance with disease management and care coordination needs.    Engaged with patient Engaged with patient by telephone for initial visit in response to referral for case management and/or care coordination services.  Ms. Swiech was given information about Managed Medicaid Care Coordination team services today. [name] [name] agreed to services and verbal consent obtained.   Objective:  Lab Results  Component Value Date   CREATININE 0.91 05/21/2020   CREATININE 0.92 11/13/2019   CREATININE 1.00 08/30/2018    Lab Results  Component Value Date   HGBA1C 6.3 05/21/2020       Component Value Date/Time   CHOL 159 11/13/2019 1351   TRIG 286 (H) 11/13/2019 1351   HDL 40 (L) 11/13/2019 1351   CHOLHDL 4.0 11/13/2019 1351   VLDL 44 (H) 07/12/2016 1137   LDLCALC 81 11/13/2019 1351    Other: (TSH, CBC, Vit D, etc.)  Clinical ASCVD: Yes  The 10-year ASCVD risk score [name], et al., 2013) is: 22.3%   Values used to calculate the score:     Age: 56 years     Sex: Female     Is Non-Hispanic African American: Yes     Diabetic: Yes     Tobacco smoker: No     Systolic Blood Pressure: 122 mmHg     Is BP treated: Yes     HDL Cholesterol: 40 mg/dL     Total Cholesterol: 159 mg/dL    Other: (CHADS2VASc if Afib, PHQ9 if depression, MMRC or CAT for COPD, ACT, DEXA)  BP Readings from Last 3 Encounters:  10/29/20 (!) 149/84  10/02/20 136/88  08/25/20 130/82    Assessment/Interventions: Review of patient past medical history, allergies, medications, health status, including review of consultants reports, laboratory and other test data, was performed as part of comprehensive evaluation and provision of chronic care  management services.   HTN Checks BP daily:  123/74 on 04/25/20 BP Readings from Last 3 Encounters:  10/29/20 (!) 149/84  10/02/20 136/88  08/25/20 130/82   Metoprolol 25mg  BID Amlodipine Plan: At goal,  patient stable/ symptoms controlled   DM Lab Results  Component Value Date   HGBA1C 6.3 05/21/2020   HGBA1C 6.3 (A) 11/22/2019   HGBA1C 6.6 10/18/2019   Lab Results  Component Value Date   LDLCALC 81 11/13/2019   CREATININE 0.91 05/21/2020    Lab Results  Component Value Date   NA 139 05/21/2020   K 4.2 05/21/2020   CREATININE 0.91 05/21/2020   GFRNONAA 89 11/24/2015   GFRAA >89 11/24/2015   GLUCOSE 117 (H) 05/21/2020   Liraglutide (Due to steroids) Plan: At goal,  patient stable/ symptoms controlled   Lipids Lab Results  Component Value Date   CHOL 159 11/13/2019   CHOL 141 08/30/2018   CHOL 149 11/16/2017   Lab Results  Component Value Date   HDL 40 (L) 11/13/2019   HDL 32 (L) 08/30/2018   HDL 36 (L) 11/16/2017   Lab Results  Component Value Date   LDLCALC 81 11/13/2019   LDLCALC 79 08/30/2018   LDLCALC 76 11/16/2017   Lab Results  Component Value Date   TRIG 286 (H) 11/13/2019   TRIG 208 (H) 08/30/2018  TRIG 308 (H) 11/16/2017   Lab Results  Component Value Date   CHOLHDL 4.0 11/13/2019   CHOLHDL 4.4 08/30/2018   CHOLHDL 4.1 11/16/2017   No results found for: LDLDIRECT  Atorvastatin 10mg  (Take 0.5 tablet) Plan: At goal,  patient stable/ symptoms controlled   HIV Dx in [place] (Taking for years) Plan: At goal,  patient stable/ symptoms controlled   Mood Depression screen [facility] 2/9 10/29/2020 10/29/2020 10/02/2020  Decreased Interest 0 0 0  Down, Depressed, Hopeless 0 0 0  PHQ - 2 Score 0 0 0  Altered sleeping 1 - -  Tired, decreased energy 1 - -  Change in appetite 0 - -  Feeling bad or failure about yourself  0 - -  Trouble concentrating 0 - -  Moving slowly or fidgety/restless 0 - -  Suicidal thoughts 0 - -  PHQ-9 Score  2 - -  Difficult doing work/chores Not difficult at all - -  Some recent data might be hidden    Brother is sick and causes her stress Bupropion (Making her dizzy) Jan 2022 Plan: Take Bupropion at night April 2022: Taking Bupropion in Evening helped completely! She's glad she's on it because her brother passes 06/16/20 (Same day as sister's birthday). July 2022: Patient would like increased dose of Bupropion, states she asked PCP but hasn't heard back. Will ask PCP.  Migraines -Has headaches every other day  -Migraines every other month Nortriptyline Sumatriptan Plan: At goal,  patient stable/ symptoms controlled   Pain Tramadol (not taking) -Hates Pain meds Plan: At goal,  patient stable/ symptoms controlled    Itching Monteleukast Gabapentin 100mg  Cetirizine 10mg  Gabapentin Jan 2022 Plan: Patient experiencing itching at night. Day/Noon doses of steroid could be masking during the day. She will reach out to specialist immediately, she told me she has f/u soon and didn't wish me to call April 2022: Patient has no more issues with itching, newly prescribed Gabapentin from Dr. Roswell Nickel and it helps great!   Addison's Hydrocortisone -Only taking BID, not TID due to weight gain Plan: Patient will let specialist know she's not taking as directed   SDOH (Social Determinants of Health) assessments and interventions performed:    Care Plan  Allergies  Allergen Reactions   Acetaminophen Other (See Comments)    Inflamed liver, hospitalized REACTION: Had liver problems, was hospitalized Inflamed liver, hospitalized Other reaction(s): liver demage Other reaction(s): liver demage Other reaction(s): liver demage Other reaction(s): liver demage Other reaction(s): liver demage Other reaction(s): liver demage Other reaction(s): liver demage   Morphine Hives and Rash    REACTION: rash   Triamterene Hives   Aspirin-Caffeine     Other reaction(s): liver demage Other  reaction(s): liver demage Other reaction(s): liver demage   Citalopram     Other reaction(s): INCREASED ANXIETY Other reaction(s): INCREASED ANXIETY   Citalopram Hydrobromide     Other reaction(s): INCREASED ANXIETY   Dyazide [Hydrochlorothiazide W-Triamterene] Hives   Empagliflozin     Other reaction(s): stomach ache, diarrhea Other reaction(s): stomach ache, diarrhea Other reaction(s): stomach ache, diarrhea   Emtricitabine-Tenofovir Df     Other reaction(s): rash Other reaction(s): rash Other reaction(s): rash   Lisinopril     Cough    Metformin Hcl Er     Other reaction(s): upset stomach Other reaction(s): upset stomach   Morphine Sulfate     Other reaction(s): shortness of breath and hives Other reaction(s): shortness of breath and hives Other reaction(s): shortness of breath and hives  Topamax [Topiramate]    Triamterene-Hctz     Other reaction(s): rash Other reaction(s): rash Other reaction(s): rash   Losartan Rash    Pt had rash, worsening dizziness, and nausea after starting losartan, which improved after stopping losartan    Medications Reviewed Today     Reviewed by Lane Hacker, [facility] (Pharmacist) on 11/09/20 at 1004  Med List Status: <None>   Medication Order Taking? Sig Documenting Provider Last Dose Status Informant  amLODipine (NORVASC) 10 MG tablet [id number] Yes Take 1 tablet (10 mg total) by mouth daily. [name] Dame, MD Taking Active   atorvastatin (LIPITOR) 10 MG tablet [id number] Yes TAKE HALF TABLET BY MOUTH DAILY Katsadouros, Vasilios, MD Taking Active   bictegravir-emtricitabine-tenofovir AF (BIKTARVY) 50-200-25 MG TABS tablet [id number] Yes Take 1 tablet by mouth daily. Wise Callas, NP Taking Active   Budeson-Glycopyrrol-Formoterol (BREZTRI AEROSPHERE) 160-9-4.8 MCG/ACT Hollie Salk [id number] Yes Inhale 2 puffs into the lungs in the morning and at bedtime. On empty lungs Kozlow, Donnamarie Poag, MD Taking Active   buPROPion (WELLBUTRIN XL) 150 MG 24 hr  tablet [id number] Yes Take one tablet in the morning [name] Dame, MD Taking Active            Med Note Merrilyn Puma, Rhona Leavens May 11, 2020 11:34 AM) Making her dizzy, will do trial at HS  cetirizine (ZYRTEC) 10 MG tablet [id number]  1 tablet daily Kozlow, Donnamarie Poag, MD  Active   cholecalciferol (VITAMIN D) 25 MCG (1000 UNIT) tablet [id number] Yes TAKE ONE TABLET BY MOUTH DAILY Jose Persia, MD Taking Active   Elastic Bandages & Supports (WRIST/THUMB SPLINT/RIGHT MED) MISC [id number]  Wear on R thumb/wrist. Ladona Horns, MD  Active   famotidine (PEPCID) 40 MG tablet [id number]  Take 1 tablet (40 mg total) by mouth at bedtime. Kozlow, Donnamarie Poag, MD  Active   gabapentin (NEURONTIN) 100 MG capsule [id number]  Take 1 capsule (100 mg total) by mouth 3 (three) times daily. Kozlow, Donnamarie Poag, MD  Active   hydrocortisone (CORTEF) 10 MG tablet [id number]  Take 10 mg by mouth as directed. Pt takes 10 mg in the am and 5 mg at 11 am and 3 pm [provider]  Active            Med Note Bonnita Levan May 11, 2020 11:25 AM) Only taking BID, morning and noon. Taking in evening makes her gain too much weight  liraglutide (VICTOZA) 18 MG/3ML SOPN [id number] Yes Inject into the skin. [provider] Taking Active   metoprolol tartrate (LOPRESSOR) 25 MG tablet [id number] Yes TAKE ONE TABLET BY MOUTH TWICE A Harlow Mares, MD Taking Active   montelukast (SINGULAIR) 10 MG tablet [id number] Yes Take 1 tablet (10 mg total) by mouth at bedtime. Kozlow, Donnamarie Poag, MD Taking Active   nitroGLYCERIN (NITROSTAT) 0.4 MG SL tablet [id number]  Place 1 tablet (0.4 mg total) under the tongue every 5 (five) minutes as needed for chest pain. Isaiah Serge, NP  Active   nortriptyline (PAMELOR) 10 MG capsule [id number]  Take 2 capsules (20 mg total) by mouth at bedtime. Suzzanne Cloud, NP  Active   pantoprazole (PROTONIX) 40 MG tablet [id number]  Take 1 tablet (40 mg total) by mouth 2 (two) times daily. [name] Dame, MD  Expired 10/05/20 2359   potassium chloride SA (KLOR-CON) 20 MEQ tablet [id number]  Take 20 mEq by mouth daily. [provider]  Active   PROAIR HFA 108 (  90 Base) MCG/ACT inhaler [id number]  INHALE 1-2 PUFFS BY MOUTH INTO THE LUNGS EVERY 4 HOURS AS NEEDED FOR WHEEZING OR SHORTNESS OF BREATH Kozlow, Donnamarie Poag, MD  Active   promethazine (PHENERGAN) 25 MG tablet [id number]  Take 1 tablet (25 mg total) by mouth every 6 (six) hours as needed for nausea or vomiting. Kalman Shan Ratliff, DO  Active   sodium chloride (OCEAN) 0.65 % SOLN nasal spray [id number]  PLACE 1 SPRAY INTO BOTH NOSTRILS AS NEEDED FOR CONGESTION. Kalman Shan Ratliff, DO  Active   triamcinolone (NASACORT) 55 MCG/ACT AERO nasal inhaler [id number]  1 spray each nostril 2 times per day Jiles Prows, MD  Active             Patient Active Problem List   Diagnosis Date Noted   Left leg swelling 07/09/2020   Grief reaction 06/08/2020   Arm pain 02/10/2020   Dizziness on standing 12/27/2019   Generalized anxiety disorder with panic attacks 06/20/2019   Osteoarthritis of thumb, right 11/29/2018   Chronic migraine w/o aura w/o status migrainosus, not intractable 08/27/2018   Disorder of left eustachian tube 05/19/2017   Vertigo of central origin 01/23/2017   Central perforation of tympanic membrane of left ear 12/09/2016   Eustachian tube dysfunction, bilateral 06/15/2016   Ear pain, right 06/15/2016   Spondylosis of lumbar region without myelopathy or radiculopathy 10/08/2015   BPPV (benign paroxysmal positional vertigo) 10/02/2015   Liver fibrosis ([place]) 12/04/2014   Pituitary microadenoma ([place]) 08/08/2014   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   Vitamin D deficiency 06/29/2014   Secondary adrenal insufficiency ([place]) 06/29/2014   History of tympanostomy tube placement 02/07/2014   Refusal of blood transfusions as patient is Jehovah's Witness 11/18/2013   Hepatitis C virus infection cured  after antiviral drug therapy 03/16/2012   Health care maintenance 12/15/2011   Opioid dependence ([place]) 10/05/2010   HTN (hypertension) 10/05/2010   Hyperlipidemia associated with type 2 diabetes mellitus ([place]) 10/23/2008   HIV disease ([place]) 05/09/2006   Depression 05/09/2006   Allergic rhinitis 05/09/2006   GERD 05/09/2006    Conditions to be addressed/monitored: HTN, HLD, DM and Anxiety  Patient Care Plan: General Plan of Care (Adult)     Problem Identified: Health Promotion or Disease Self-Management (General Plan of Care)   Priority: Medium  Onset Date: 05/07/2020     Patient Care Plan: General Plan of Care (Adult)     Problem Identified: Health Promotion or Disease Self-Management (General Plan of Care)      Goal: Self-Management Plan Developed   Note:   Current Barriers:  Chronic Disease Management support and education needs. Patient helping to provide care to brother.  Nurse Case Manager Clinical Goal(s):  Over the next 30 days, patient will attend all scheduled medical appointments. Over the next 30 days, patient will work with CM team pharmacist to review medications.  Interventions:  Inter-disciplinary care team collaboration (see longitudinal plan of care) Evaluation of current treatment plan and patient's adherence to plan as established by provider. Reviewed medications with patient. Collaborated with pharmacy regarding medications. Discussed plans with patient for ongoing care management follow up and provided patient with direct contact information for care management team Reviewed scheduled/upcoming provider appointments. Pharmacy referral for medication review.  Patient Goals/Self-Care Activities Over the next 30 days, patient will:  -Attends all scheduled provider appointments Calls pharmacy for medication refills Calls provider office for new concerns or questions  Follow Up Plan: The Managed Medicaid care management team will  reach out to the  patient again over the next 30 days.  The patient has been provided with contact information for the Managed Medicaid care management team and has been advised to call with any health related questions or concerns.       Task: Mutually Develop and Royce Macadamia Achievement of Patient Goals   Note:   Care Management Activities:    - verbalization of feelings encouraged    Notes:     Patient Care Plan: Medication Management     Problem Identified: Health Promotion or Disease Self-Management (General Plan of Care)      Goal: Self-Management Plan Developed   Note:   Current Barriers:  Unable to self administer medications as prescribed Unable to explain what BP, Hyperlipidemia, and other disease states do to the body Itching at night ADR's from steroid TID  Pharmacist Clinical Goal(s):  Over the next 90 days, patient will contact provider office for questions/concerns as evidenced notation of same in electronic health record through collaboration with PharmD and provider.    Interventions: Inter-disciplinary care team collaboration (see longitudinal plan of care) Comprehensive medication review performed; medication list updated in electronic medical record Patient will let specialist know, has appt soon, about how she isn't taking steroid TID (As directed) due to ADR's (Weight gain). I believe this is why she's itching at night (She's itching daily but the AM and noon doses are hiding it)  @RXCPDIABETES @ @RXCPHYPERTENSION @ @RXCPHYPERLIPIDEMIA @ @RXCPMENTALHEALTH @  Patient Goals/Self-Care Activities Over the next 90 days, patient will:  - take medications as prescribed  Follow Up Plan: The care management team will reach out to the patient again over the next 90 days.      Task: Mutually Develop and Royce Macadamia Achievement of Patient Goals   Note:   Care Management Activities:    - verbalization of feelings encouraged    Notes:      Medication Assistance: None required.  Patient affirms current coverage meets needs.   Follow up: Agree  Plan: The care management team will reach out to the patient again over the next 90 days.   [place] Constable, Pharm.D., Managed Medicaid Pharmacist - [phone]

## 2020-11-09 NOTE — Progress Notes (Signed)
Cardiology Office Note:    Date:  11/10/2020   ID:  [name] [name], DOB 04-30-1960, MRN [id number]  PCP:  [name], MD   [facility] Cardiologist:  Jenkins Rouge, MD      Referring MD: [name], MD   Chief Complaint:  Follow-up (Shortness of breath, chest pain/Myoview, Echocardiogram )    Patient Profile:    [name] [name] is a 60 y.o. female with:  Diabetes mellitus  Hyperlipidemia  Hypertension  Asthma  Hepatitis C HIV Pituitary adenoma  Addison's disease; on steroids  GERD  Chronic kidney disease  Hx of Pancreatitis  Jehovah's witness   Prior CV studies: Echocardiogram 08/18/20 EF 60-65, no RWMA, moderate LVH, normal RVSF  GATED SPECT MYO PERF W/LEXISCAN STRESS 1D 07/27/2020 EF 62 No ischemia or infarction; low risk   History of Present Illness: Ms. Broadfoot was last seen by Dr. Johnsie Cancel in 3/22 with symptoms of chest pain, shortness of breath, leg edema.  She had a CXR which was normal.  An echocardiogram showed normal LVF.  A Myoview was low risk.  She is seen for follow-up.  She is here alone.  Unfortunately, her brother passed away recently.  We reviewed the results of her echocardiogram and stress test.  She notes a family history of CAD.  We discussed the importance of risk factor modification.  She is not having chest pain, syncope, orthopnea.  She notes mild pedal edema that resolves with elevation.  She has chronic shortness of breath with exertion.  This is unchanged.    Past Medical History:  Diagnosis Date   Allergic rhinitis 05/09/2006   Allergy    Anxiety    Arthritis    Asthma    CHF (congestive heart failure) (HCC)    Chronic back pain    Diabetes mellitus without complication (HCC)    GERD (gastroesophageal reflux disease)    Heart murmur    as a child   Hepatitis C    genotype 1b, 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 s/p 12 week course of simeprevir and sofosbuvir between October 2014 and January 2015 with  resolution.   History of shingles    HIV infection ([place])    1994   Hyperlipidemia    no meds taken now   Hypertension    Migraine    Pituitary microadenoma ([place]) 08/08/2014   Prediabetes    Refusal of blood transfusions as patient is Jehovah's Witness    Secondary adrenal insufficiency ([place]) 06/29/2014   Urticaria     Current Medications: Current Meds  Medication Sig   amLODipine (NORVASC) 10 MG tablet Take 1 tablet (10 mg total) by mouth daily.   atorvastatin (LIPITOR) 10 MG tablet TAKE HALF TABLET BY MOUTH DAILY   bictegravir-emtricitabine-tenofovir AF (BIKTARVY) 50-200-25 MG TABS tablet Take 1 tablet by mouth daily.   Budeson-Glycopyrrol-Formoterol (BREZTRI AEROSPHERE) 160-9-4.8 MCG/ACT AERO Inhale 2 puffs into the lungs in the morning and at bedtime. On empty lungs   buPROPion (WELLBUTRIN XL) 150 MG 24 hr tablet Take one tablet in the morning   cetirizine (ZYRTEC) 10 MG tablet 1 tablet daily   cholecalciferol (VITAMIN D) 25 MCG (1000 UNIT) tablet TAKE ONE TABLET BY MOUTH DAILY   Elastic Bandages & Supports (WRIST/THUMB SPLINT/RIGHT MED) MISC Wear on R thumb/wrist.   famotidine (PEPCID) 40 MG tablet Take 1 tablet (40 mg total) by mouth at bedtime.   gabapentin (NEURONTIN) 100 MG capsule Take 1 capsule (100 mg total) by mouth 3 (  three) times daily.   hydrocortisone (CORTEF) 10 MG tablet Take 10 mg by mouth as directed. Pt takes 10 mg in the am and 5 mg at 11 am and 3 pm   liraglutide (VICTOZA) 18 MG/3ML SOPN Inject into the skin.   metoprolol tartrate (LOPRESSOR) 25 MG tablet TAKE ONE TABLET BY MOUTH TWICE A DAY   montelukast (SINGULAIR) 10 MG tablet Take 1 tablet (10 mg total) by mouth at bedtime.   nitroGLYCERIN (NITROSTAT) 0.4 MG SL tablet Place 1 tablet (0.4 mg total) under the tongue every 5 (five) minutes as needed for chest pain.   nortriptyline (PAMELOR) 10 MG capsule Take 2 capsules (20 mg total) by mouth at bedtime.   pantoprazole (PROTONIX) 40 MG tablet Take 1 tablet (40  mg total) by mouth 2 (two) times daily.   potassium chloride SA (KLOR-CON) 20 MEQ tablet Take 20 mEq by mouth daily.   PROAIR HFA 108 (90 Base) MCG/ACT inhaler INHALE 1-2 PUFFS BY MOUTH INTO THE LUNGS EVERY 4 HOURS AS NEEDED FOR WHEEZING OR SHORTNESS OF BREATH   promethazine (PHENERGAN) 25 MG tablet Take 1 tablet (25 mg total) by mouth every 6 (six) hours as needed for nausea or vomiting.   sodium chloride (OCEAN) 0.65 % SOLN nasal spray PLACE 1 SPRAY INTO BOTH NOSTRILS AS NEEDED FOR CONGESTION.   triamcinolone (NASACORT) 55 MCG/ACT AERO nasal inhaler 1 spray each nostril 2 times per day     Allergies:   Acetaminophen, Morphine, Triamterene, Aspirin-caffeine, Citalopram, Citalopram hydrobromide, Dyazide [hydrochlorothiazide w-triamterene], Empagliflozin, Emtricitabine-tenofovir df, Lisinopril, Metformin hcl er, Morphine sulfate, Topamax [topiramate], Triamterene-hctz, and Losartan   Social History   Tobacco Use   Smoking status: Former    Years: 20.00    Types: Cigarettes    Quit date: 04/26/2007    Years since quitting: 13.5   Smokeless tobacco: Never   Tobacco comments:    QUIT 2009  Vaping Use   Vaping Use: Never used  Substance Use Topics   Alcohol use: No    Alcohol/week: 0.0 standard drinks   Drug use: No     Family Hx: The patient's family history includes Allergic rhinitis in her sister and sister; Asthma in her sister and sister; Cancer in her maternal aunt, maternal aunt, and sister; Colon polyps in her brother; Diabetes in her father and mother; Heart disease in her father and mother; Hepatitis in her sister; Renal Disease in her brother; Stroke in her father, mother, and another family member. There is no history of Colon cancer, Esophageal cancer, Stomach cancer, Rectal cancer, Angioedema, Eczema, or Urticaria.  ROS   EKGs/Labs/Other Test Reviewed:    EKG:  EKG is not ordered today.  The ekg ordered today demonstrates n/a  Recent Labs: 11/13/2019: ALT 18; Hemoglobin  14.0; Platelets 253 05/21/2020: BUN 13; Creat 0.91; Potassium 4.2; Sodium 139   Recent Lipid Panel Lab Results  Component Value Date/Time   CHOL 159 11/13/2019 01:51 PM   TRIG 286 (H) 11/13/2019 01:51 PM   HDL 40 (L) 11/13/2019 01:51 PM   LDLCALC 81 11/13/2019 01:51 PM    Risk Assessment/Calculations:      Physical Exam:    VS:  BP 124/62 (BP Location: Left Arm, Patient Position: Sitting, Cuff Size: Normal)   Pulse 92   Ht 5\' 1"  (1.549 m)   Wt 180 lb (81.6 kg)   SpO2 96%   BMI 34.01 kg/m     Wt Readings from Last 3 Encounters:  11/10/20 180 lb (81.6 kg)  10/29/20 181 lb 12.8 oz (82.5 kg)  10/02/20 179 lb 3.2 oz (81.3 kg)     Constitutional:      Appearance: Healthy appearance. Not in distress.  Pulmonary:     Effort: Pulmonary effort is normal.     Breath sounds: No wheezing. No rales.  Cardiovascular:     Normal rate. Regular rhythm. Normal S1. Normal S2.      Murmurs: There is no murmur.  Edema:    Peripheral edema absent.  Abdominal:     Palpations: Abdomen is soft.  Musculoskeletal:     Cervical back: Neck supple. Skin:    General: Skin is warm and dry.  Neurological:     Mental Status: Alert and oriented to person, place and time.     Cranial Nerves: Cranial nerves are intact.        ASSESSMENT & PLAN:    1. Essential hypertension The patient's blood pressure is controlled on her current regimen.  Continue current therapy.    2. Hyperlipidemia associated with type 2 diabetes mellitus ([place]) Managed by primary care.  LDL was 122 in 06/2020.  She notes intolerance to statins.  Continue follow-up with PCP.  Ezetimibe could be considered to keep LDL <100.  3. Leg edema She has evidence of venous insufficiency.  I do not think her edema is related to amlodipine.  At this point, I would not suggest adding a diuretic.  We discussed the importance of elevation and the wearing of compression stockings.  4. Moderate persistent asthma, unspecified whether  complicated She has chronic shortness of breath with exertion.  She is a prior smoker.  She is followed by Dr. Neldon Mc for her asthma.  She has NYHA II-IIb symptoms.  No evidence of volume excess on exam.     Dispo:  Return in about 1 year (around 11/10/2021) for Routine follow up in 1 year with Dr. Johnsie Cancel..   Medication Adjustments/Labs and Tests Ordered: Current medicines are reviewed at length with the patient today.  Concerns regarding medicines are outlined above.  Tests Ordered: No orders of the defined types were placed in this encounter.  Medication Changes: No orders of the defined types were placed in this encounter.   Signed, Richardson Dopp, PA-C  11/10/2020 9:32 AM    [facility] [place], [place], [place]  [address] Phone: [phone]; Fax: [phone]

## 2020-11-09 NOTE — Patient Instructions (Signed)
Visit Information  [name] was given information about Medicaid Managed Care team care coordination services as a part of their [place] Medicaid benefit. [name] [name] verbally consented to engagement with the [facility] Managed Care team.   For questions related to your [facility], please call: [phone] or visit the homepage here: [email or web address]  If you would like to schedule transportation through your [facility], please call the following number at least 2 days in advance of your appointment: [phone].   Call the [place] at [phone], at any time, 24 hours a day, 7 days a week. If you are in danger or need immediate medical attention call 911.  If you would like help to quit smoking, call 1-800-QUIT-NOW 4405342769) OR Espaol: 1-855-Djelo-Ya (6-767-209-4709) o para ms informacin haga clic aqu or Text READY to 200-400 to register via text  [name] [name] - following are the goals we discussed in your visit today:   Goals Addressed   None     Please see education materials related to HTN provided as print materials.   Patient verbalizes understanding of instructions provided today.   The Managed Medicaid care management team will reach out to the patient again over the next 120 days.   [name] [name], Pharm.D., Managed Medicaid Pharmacist [phone]   Following is a copy of your plan of care:  Patient Care Plan: General Plan of Care (Adult)     Problem Identified: Health Promotion or Disease Self-Management (General Plan of Care)   Priority: Medium  Onset Date: 05/07/2020     Patient Care Plan: General Plan of Care (Adult)     Problem Identified: Health Promotion or Disease Self-Management (General Plan of Care)   Priority: Medium  Onset Date: 08/10/2020     Long-Range Goal: Self-Management Plan  Developed   Start Date: 05/07/2020  Expected End Date: 02/05/2021  Recent Progress: On track  Priority: Medium  Note:   Current Barriers:  Chronic Disease Management support and education needs.  Patient with increased anxiety and wants to increase Wellbutrin dose.  Patient with an increase in left leg swelling-saw PCP-wearing compression stockings and elevating which helps. Patient helping to provide care to brother. This causes patient a lot of stress and anxiety.  She has recently started taking Wellbutrin which she states is helping.  Patient declines any therapy services at this time. Update 16-Jul-2020: Patient's brother died 07-01-2020.  Patient is considering grief counseling with Hospice, declines SW referral at this time. Update 08/10/20:  Patient states she is doing better-doesn't feel like she needs anything right now. Update 09/09/20:  Patient without complaint today.  Breathing and itching improved with medication adjustment. Update 10/06/20:  Patient doing well, no complaints today, attending grief counseling as needed with Hospice. Update 11/05/20:  Patient speaking with grief counselor two times a month.  Nurse Case Manager Clinical Goal(s):  Over the next 30 days, patient will attend all scheduled medical appointments. Over the next 30 days, patient will work with CM team pharmacist to review medications. Update 06/02/20:  Patient met with Pharmacist 05/11/20 and has follow up scheduled. Update 07/16/2020:  Patient continues to work with Pharmacist.  Interventions:  Inter-disciplinary care team collaboration (see longitudinal plan of care) Evaluation of current treatment plan and patient's adherence to plan as established by provider. Reviewed medications with patient. Collaborated with pharmacy regarding medications. Discussed plans with patient for ongoing care management follow up and provided patient with  direct contact information for care management team Reviewed scheduled/upcoming  provider appointments. Update 06/30/20:  Patient has appointment with PCP 07/07/20. Update 09/09/20:  Patient attending all scheduled appointments. Update 11/05/20:  Patient will contact PCP regarding increasing Wellbutrin dose. Pharmacy referral for medication review-completed.  Patient Goals/Self-Care Activities Over the next 30 days, patient will:  -Attends all scheduled provider appointments Calls pharmacy for medication refills Calls provider office for new concerns or questions  Follow Up Plan: The Managed Medicaid care management team will reach out to the patient again over the next 30 days.  The patient has been provided with contact information for the Managed Medicaid care management team and has been advised to call with any health related questions or concerns.       Task: Mutually Develop and Royce Macadamia Achievement of Patient Goals   Note:   Care Management Activities:    - verbalization of feelings encouraged    Notes:     Patient Care Plan: Medication Management     Problem Identified: Health Promotion or Disease Self-Management (General Plan of Care)      Goal: Self-Management Plan Developed   Note:   Current Barriers:  Unable to self administer medications as prescribed Unable to explain what BP, Hyperlipidemia, and other disease states do to the body Update: Content with knowledge  Itching at night ADR's from steroid TID  Pharmacist Clinical Goal(s):  Over the next 90 days, patient will contact provider office for questions/concerns as evidenced notation of same in electronic health record through collaboration with PharmD and provider.    Interventions: Inter-disciplinary care team collaboration (see longitudinal plan of care) Comprehensive medication review performed; medication list updated in electronic medical record Patient will let specialist know, has appt soon, about how she isn't taking steroid TID (As directed) due to ADR's (Weight gain). I  believe this is why she's itching at night (She's itching daily but the AM and noon doses are hiding it)  '@RXCPDIABETES' @ '@RXCPHYPERTENSION' @ '@RXCPHYPERLIPIDEMIA' @ '@RXCPMENTALHEALTH' @  Patient Goals/Self-Care Activities Over the next 90 days, patient will:  - take medications as prescribed  Follow Up Plan: The care management team will reach out to the patient again over the next 90 days.      Task: Mutually Develop and Royce Macadamia Achievement of Patient Goals   Note:   Care Management Activities:    - verbalization of feelings encouraged    Notes:

## 2020-11-10 ENCOUNTER — Other Ambulatory Visit: Payer: Self-pay | Admitting: Internal Medicine

## 2020-11-10 ENCOUNTER — Encounter: Payer: Self-pay | Admitting: Physician Assistant

## 2020-11-10 ENCOUNTER — Other Ambulatory Visit: Payer: Self-pay

## 2020-11-10 ENCOUNTER — Ambulatory Visit (INDEPENDENT_AMBULATORY_CARE_PROVIDER_SITE_OTHER): Payer: Medicaid Other | Admitting: Physician Assistant

## 2020-11-10 VITALS — BP 124/62 | HR 92 | Ht 61.0 in | Wt 180.0 lb

## 2020-11-10 DIAGNOSIS — R600 Localized edema: Secondary | ICD-10-CM | POA: Diagnosis not present

## 2020-11-10 DIAGNOSIS — J4540 Moderate persistent asthma, uncomplicated: Secondary | ICD-10-CM

## 2020-11-10 DIAGNOSIS — I10 Essential (primary) hypertension: Secondary | ICD-10-CM

## 2020-11-10 DIAGNOSIS — E785 Hyperlipidemia, unspecified: Secondary | ICD-10-CM

## 2020-11-10 DIAGNOSIS — E1169 Type 2 diabetes mellitus with other specified complication: Secondary | ICD-10-CM | POA: Diagnosis not present

## 2020-11-10 NOTE — Patient Instructions (Signed)
Medication Instructions:   Your physician recommends that you continue on your current medications as directed. Please refer to the Current Medication list given to you today.  *If you need a refill on your cardiac medications before your next appointment, please call your pharmacy*   Lab Work:  -NONE  If you have labs (blood work) drawn today and your tests are completely normal, you will receive your results only by: [place] (if you have MyChart) OR A paper copy in the mail If you have any lab test that is abnormal or we need to change your treatment, we will call you to review the results.   Testing/Procedures:  -NONE   Follow-Up: At [facility], you and your health needs are our priority.  As part of our continuing mission to provide you with exceptional heart care, we have created designated Provider Care Teams.  These Care Teams include your primary Cardiologist (physician) and Advanced Practice Providers (APPs -  Physician Assistants and Nurse Practitioners) who all work together to provide you with the care you need, when you need it.  We recommend signing up for the patient portal called "MyChart".  Sign up information is provided on this After Visit Summary.  MyChart is used to connect with patients for Virtual Visits (Telemedicine).  Patients are able to view lab/test results, encounter notes, upcoming appointments, etc.  Non-urgent messages can be sent to your provider as well.   To learn more about what you can do with MyChart, go to [email or web address].    Your next appointment:   1 year(s)  The format for your next appointment:   In Person  Provider:   Jenkins Rouge, MD   Other Instructions Your physician wants you to follow-up in: 1 year with Dr. Johnsie Cancel.  You will receive a reminder letter in the mail two months in advance. If you don't receive a letter, please call our office to schedule the follow-up appointment.

## 2020-11-11 ENCOUNTER — Ambulatory Visit (INDEPENDENT_AMBULATORY_CARE_PROVIDER_SITE_OTHER): Payer: Medicaid Other | Admitting: Student

## 2020-11-11 DIAGNOSIS — F32A Depression, unspecified: Secondary | ICD-10-CM | POA: Diagnosis not present

## 2020-11-11 DIAGNOSIS — F419 Anxiety disorder, unspecified: Secondary | ICD-10-CM | POA: Diagnosis not present

## 2020-11-11 MED ORDER — BUPROPION HCL ER (XL) 300 MG PO TB24: 300.0000 mg | ORAL_TABLET | Freq: Every day | ORAL | 1 refills | Status: DC

## 2020-11-11 NOTE — Progress Notes (Signed)
  [facility] Internal Medicine Residency Telephone Encounter Continuity Care Appointment  HPI:  This telephone encounter was created for [name] on 11/11/2020 for the following purpose/cc depression/anxiety.   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 s/p 12 week course of simeprevir and sofosbuvir between October 2014 and January 2015 with resolution.   History of shingles    HIV infection ([place])    1994   Hyperlipidemia    no meds taken now   Hypertension    Migraine    Pituitary microadenoma ([place]) 08/08/2014   Prediabetes    Refusal of blood transfusions as patient is Jehovah's Witness    Secondary adrenal insufficiency ([place]) 06/29/2014   Urticaria      ROS:  As per HPI   Assessment / Plan / Recommendations:  Please see A&P under problem oriented charting for assessment of the patient's acute and chronic medical conditions.  As always, pt is advised that if symptoms worsen or new symptoms arise, they should go to an urgent care facility or to to ER for further evaluation.   Consent and Medical Decision Making:  Patient discussed with Dr. Dareen Piano This is a telephone encounter between [place] on 11/11/2020 for anxiety/depression. The visit was conducted with the patient located at home and [name] at [facility]. The patient's identity was confirmed using their DOB and current address. The patient has consented to being evaluated through a telephone encounter and understands the associated risks (an examination cannot be done and the patient may need to come in for an appointment) /  benefits (allows the patient to remain at home, decreasing exposure to coronavirus). I personally spent 9 minutes on medical discussion.

## 2020-11-11 NOTE — Assessment & Plan Note (Signed)
[name] [name] is presenting via telehealth today to discuss depressive symptoms. She mentions that she has felt more anxious recently and is inquiring about increasing her Wellbutrin. States that since her brother died a few months ago she has had difficulty concentrating and her anxiety has recently increased. She notes that when she was previously on 300mg  but stopped due to dizziness. [name] says that she discussed this with a pharmacist, who suggested taking Wellbutrin at night. Since that time she has been taking 150mg  at bedtime. She denies insomnia or trouble sleeping.  Previously SSRI's were discussed with [name], but she was hesitant due to side effects. Today will increase her Wellbutrin to 300mg  daily. Discussed with [name] that if she takes this at bedtime it could increase anxiety and cause insomnia. If she continues to have depressive symptoms, would recommend further discussions on starting SSRI.  - Increase Wellbutrin 300mg  daily

## 2020-11-12 NOTE — Progress Notes (Signed)
Internal Medicine Clinic Attending ? ?Case discussed with Dr. Braswell  At the time of the visit.  We reviewed the resident?s history and exam and pertinent patient test results.  I agree with the assessment, diagnosis, and plan of care documented in the resident?s note.  ?

## 2020-12-02 ENCOUNTER — Other Ambulatory Visit: Payer: Self-pay

## 2020-12-02 ENCOUNTER — Other Ambulatory Visit: Payer: Self-pay | Admitting: Obstetrics and Gynecology

## 2020-12-02 NOTE — Patient Instructions (Signed)
Hi [name] [name], thank you for speaking with me today.  Have a nice day.  [name] [name] was given information about Medicaid Managed Care team care coordination services as a part of their [place] Medicaid benefit. [name] [name] verbally consented to engagement with the [facility] Managed Care team.   If you are experiencing a medical emergency, please call 911 or report to your local emergency department or urgent care.   If you have a non-emergency medical problem during routine business hours, please contact your provider's office and ask to speak with a nurse.   For questions related to your [facility], please call: [phone] or visit the homepage here: [email or web address]  If you would like to schedule transportation through your [facility], please call the following number at least 2 days in advance of your appointment: [phone].   Call the [facility] at [phone], at any time, 24 hours a day, 7 days a week. If you are in danger or need immediate medical attention call 911.  If you would like help to quit smoking, call 1-800-QUIT-NOW (224)826-4860) OR Espaol: 1-855-Djelo-Ya (8-299-371-6967) o para ms informacin haga clic aqu or Text READY to 200-400 to register via text  [name] [name] - following are the goals we discussed in your visit today:   Goals Addressed             This Visit's Progress    Protect My Health       Timeframe:  Long-Range Goal Priority:  Medium Start Date:      05/07/20                       Expected End Date: ongoing             Follow Up Date:  01/02/21   - schedule appointment for vaccines needed due to my age or health - schedule recommended health tests (blood work, mammogram, colonoscopy, pap test) - schedule and keep appointment for annual check-up    Why is this important?   Screening tests  can find diseases early when they are easier to treat.  Your doctor or nurse will talk with you about which tests are important for you.  Getting shots for common diseases like the flu and shingles will help prevent them.     Update 06/30/20:  Patient has appointment with PCP 07/07/20. Update 08/10/20:  Patient attending all appointments. Update 09/09/20:  Patient with no complaints, attending all appointments. Update 10/10/20:Patient doing well today, attending grief counseling as needed. Update 11/05/20:  Grief counseling twice a month, increased anxiety and wants to increase dose of Wellbutrin-patient will contact her PCP. Update 12/02/20:  Patient keeping all appointments-has increased Wellbutrin and this is helpful.  Has appointment with Dr. Buddy Duty this week.        Patient verbalizes understanding of instructions provided today.   The Managed Medicaid care management team will reach out to the patient again over the next 30 days.  The  Patient                                              has been provided with contact information for the Managed Medicaid care management team and has been advised to call with any health related questions or concerns.   [name]  Craft RN, [place] Management Coordinator - Managed [place] High Risk [phone].    Following is a copy of your plan of care:   Problem Identified: Health Promotion or Disease Self-Management (General Plan of Care)   Priority: Medium  Onset Date: 05/07/2020   Problem Identified: Health Promotion or Disease Self-Management (General Plan of Care)   Priority: Medium  Onset Date: 08/10/2020     Long-Range Goal: Self-Management Plan Developed   Start Date: 05/07/2020  Expected End Date: 02/05/2021  Recent Progress: On track  Priority: Medium  Note:   Current Barriers:  Chronic Disease Management support and education needs.  Patient with increased anxiety and wants to increase Wellbutrin  dose.  Patient with an increase in left leg swelling-saw PCP-wearing compression stockings and elevating which helps. Patient helping to provide care to brother. This causes patient a lot of stress and anxiety.  She has recently started taking Wellbutrin which she states is helping.  Patient declines any therapy services at this time. Update July 02, 2020: Patient's brother died June 17, 2020.  Patient is considering grief counseling with Hospice, declines SW referral at this time. Update 08/10/20:  Patient states she is doing better-doesn't feel like she needs anything right now. Update 09/09/20:  Patient without complaint today.  Breathing and itching improved with medication adjustment. Update 10/06/20:  Patient doing well, no complaints today, attending grief counseling as needed with Hospice. Update 11/05/20:  Patient speaking with grief counselor two times a month. Update 12/02/20:  Patient has increased Wellbutrin after speaking with her PCP to 300 mg and this is helping.  Left foot swelling, no change, elevates and wears compression stockings as needed.  Nurse Case Manager Clinical Goal(s):  Over the next 30 days, patient will attend all scheduled medical appointments. Over the next 30 days, patient will work with CM team pharmacist to review medications. Update 06/02/20:  Patient met with Pharmacist 05/11/20 and has follow up scheduled. Update 2020/07/02:  Patient continues to work with Pharmacist.  Interventions:  Inter-disciplinary care team collaboration (see longitudinal plan of care) Evaluation of current treatment plan and patient's adherence to plan as established by provider. Reviewed medications with patient. Collaborated with pharmacy regarding medications. Discussed plans with patient for ongoing care management follow up and provided patient with direct contact information for care management team Reviewed scheduled/upcoming provider appointments. Update 02-Jul-2020:  Patient has appointment with PCP  07/07/20. Update 09/09/20:  Patient attending all scheduled appointments. Update 11/05/20:  Patient will contact PCP regarding increasing Wellbutrin dose. Pharmacy referral for medication review-completed.  Patient Goals/Self-Care Activities Over the next 30 days, patient will:  -Attends all scheduled provider appointments Calls pharmacy for medication refills Calls provider office for new concerns or questions  Follow Up Plan: The Managed Medicaid care management team will reach out to the patient again over the next 30 days.  The patient has been provided with contact information for the Managed Medicaid care management team and has been advised to call with any health related questions or concerns.

## 2020-12-02 NOTE — Patient Outreach (Signed)
Medicaid Managed Care   Nurse Care Manager Note  12/02/2020 Name:  [name] [name] MRN:  [id number] DOB:  1960-05-19  [name] [name] is an 60 y.o. year old female who is a primary patient of [name], MD.  The [facility] Managed Care Coordination team was consulted for assistance with:    Chronic healthcare management needs.  Ms. Dilday was given information about Medicaid Managed Care Coordination team services today. [name] [name] Patient agreed to services and verbal consent obtained.  Engaged with patient by telephone for follow up visit in response to provider referral for case management and/or care coordination services.   Assessments/Interventions:  Review of past medical history, allergies, medications, health status, including review of consultants reports, laboratory and other test data, was performed as part of comprehensive evaluation and provision of chronic care management services.  SDOH (Social Determinants of Health) assessments and interventions performed: SDOH Interventions    Flowsheet Row Most Recent Value  SDOH Interventions   Food Insecurity Interventions Intervention Not Indicated  Financial Strain Interventions Intervention Not Indicated  Housing Interventions Intervention Not Indicated  Intimate Partner Violence Interventions Intervention Not Indicated  Physical Activity Interventions Intervention Not Indicated  Stress Interventions Intervention Not Indicated  Social Connections Interventions Intervention Not Indicated  Transportation Interventions Intervention Not Indicated       Care Plan  Allergies  Allergen Reactions   Acetaminophen Other (See Comments)    Inflamed liver, hospitalized REACTION: Had liver problems, was hospitalized Inflamed liver, hospitalized Other reaction(s): liver demage Other reaction(s): liver demage Other reaction(s): liver demage Other reaction(s): liver demage Other reaction(s): liver demage Other reaction(s):  liver demage Other reaction(s): liver demage   Morphine Hives and Rash    REACTION: rash   Triamterene Hives   Aspirin-Caffeine     Other reaction(s): liver demage Other reaction(s): liver demage Other reaction(s): liver demage   Citalopram     Other reaction(s): INCREASED ANXIETY Other reaction(s): INCREASED ANXIETY   Citalopram Hydrobromide     Other reaction(s): INCREASED ANXIETY   Dyazide [Hydrochlorothiazide W-Triamterene] Hives   Empagliflozin     Other reaction(s): stomach ache, diarrhea Other reaction(s): stomach ache, diarrhea Other reaction(s): stomach ache, diarrhea   Emtricitabine-Tenofovir Df     Other reaction(s): rash Other reaction(s): rash Other reaction(s): rash   Lisinopril     Cough    Metformin Hcl Er     Other reaction(s): upset stomach Other reaction(s): upset stomach   Morphine Sulfate     Other reaction(s): shortness of breath and hives Other reaction(s): shortness of breath and hives Other reaction(s): shortness of breath and hives   Topamax [Topiramate]    Triamterene-Hctz     Other reaction(s): rash Other reaction(s): rash Other reaction(s): rash   Losartan Rash    Pt had rash, worsening dizziness, and nausea after starting losartan, which improved after stopping losartan    Medications Reviewed Today     Reviewed by Gayla Medicus, RN (Registered Nurse) on 12/02/20 at [facility] List Status: <None>   Medication Order Taking? Sig Documenting Provider Last Dose Status Informant  amLODipine (NORVASC) 10 MG tablet [id number] No Take 1 tablet (10 mg total) by mouth daily. [name], MD Taking Active   atorvastatin (LIPITOR) 10 MG tablet [id number] No TAKE HALF TABLET BY MOUTH DAILY Katsadouros, Vasilios, MD Taking Active   bictegravir-emtricitabine-tenofovir AF (BIKTARVY) 50-200-25 MG TABS tablet [id number] No Take 1 tablet by mouth daily. [place] Callas, NP Taking Active   Budeson-Glycopyrrol-Formoterol (BREZTRI AEROSPHERE) 160-9-4.8  MCG/ACT AERO [id number] No Inhale 2 puffs into the lungs in the morning and at bedtime. On empty lungs Kozlow, Donnamarie Poag, MD Taking Active   buPROPion (WELLBUTRIN XL) 300 MG 24 hr tablet [id number]  Take 1 tablet (300 mg total) by mouth daily. [name], MD  Active   cetirizine (ZYRTEC) 10 MG tablet [id number] No 1 tablet daily Kozlow, Donnamarie Poag, MD Taking Active   cholecalciferol (VITAMIN D) 25 MCG (1000 UNIT) tablet [id number] No TAKE ONE TABLET BY MOUTH DAILY Jose Persia, MD Taking Active   Elastic Bandages & Supports (WRIST/THUMB SPLINT/RIGHT MED) MISC [id number] No Wear on R thumb/wrist. Ladona Horns, MD Taking Active   famotidine (PEPCID) 40 MG tablet [id number] No Take 1 tablet (40 mg total) by mouth at bedtime. Kozlow, Donnamarie Poag, MD Taking Active   gabapentin (NEURONTIN) 100 MG capsule [id number] No Take 1 capsule (100 mg total) by mouth 3 (three) times daily. Kozlow, Donnamarie Poag, MD Taking Active   hydrocortisone (CORTEF) 10 MG tablet [id number] No Take 10 mg by mouth as directed. Pt takes 10 mg in the am and 5 mg at 11 am and 3 pm [provider] Taking Active            Med Note Bonnita Levan May 11, 2020 11:25 AM) Only taking BID, morning and noon. Taking in evening makes her gain too much weight  liraglutide (VICTOZA) 18 MG/3ML SOPN [id number] No Inject into the skin. [provider] Taking Active   metoprolol tartrate (LOPRESSOR) 25 MG tablet [id number]  TAKE ONE TABLET BY MOUTH TWICE A DAY [name], MD  Active   montelukast (SINGULAIR) 10 MG tablet [id number] No Take 1 tablet (10 mg total) by mouth at bedtime. Kozlow, Donnamarie Poag, MD Taking Active   nitroGLYCERIN (NITROSTAT) 0.4 MG SL tablet [id number] No Place 1 tablet (0.4 mg total) under the tongue every 5 (five) minutes as needed for chest pain. Isaiah Serge, NP Taking Active   nortriptyline (PAMELOR) 10 MG capsule [id number] No Take 2 capsules (20 mg total) by mouth at bedtime. Suzzanne Cloud, NP Taking Active    pantoprazole (PROTONIX) 40 MG tablet [id number] No Take 1 tablet (40 mg total) by mouth 2 (two) times daily. [name], MD Taking Expired 11/10/20 2359   potassium chloride SA (KLOR-CON) 20 MEQ tablet [id number] No Take 20 mEq by mouth daily. [provider] Taking Active   PROAIR HFA 108 [phone] Base) MCG/ACT inhaler [id number] No INHALE 1-2 PUFFS BY MOUTH INTO THE LUNGS EVERY 4 HOURS AS NEEDED FOR WHEEZING OR SHORTNESS OF BREATH Kozlow, Donnamarie Poag, MD Taking Active   promethazine (PHENERGAN) 25 MG tablet [id number] No Take 1 tablet (25 mg total) by mouth every 6 (six) hours as needed for nausea or vomiting. [name], DO Taking Active   sodium chloride (OCEAN) 0.65 % SOLN nasal spray [id number] No PLACE 1 SPRAY INTO BOTH NOSTRILS AS NEEDED FOR CONGESTION. [name], DO Taking Active   triamcinolone (NASACORT) 55 MCG/ACT AERO nasal inhaler [id number] No 1 spray each nostril 2 times per day Jiles Prows, MD Taking Active             Patient Active Problem List   Diagnosis Date Noted   Left leg swelling 07/09/2020   Grief reaction 06/08/2020   Arm pain 02/10/2020   Dizziness on standing 12/27/2019   Generalized anxiety disorder with panic attacks 06/20/2019   Osteoarthritis of thumb, right 11/29/2018   Chronic  migraine w/o aura w/o status migrainosus, not intractable 08/27/2018   Disorder of left eustachian tube 05/19/2017   Vertigo of central origin 01/23/2017   Central perforation of tympanic membrane of left ear 12/09/2016   Eustachian tube dysfunction, bilateral 07/10/16   Ear pain, right July 10, 2016   Spondylosis of lumbar region without myelopathy or radiculopathy 10/08/2015   BPPV (benign paroxysmal positional vertigo) 10/02/2015   Liver fibrosis ([place]) 12/04/2014   Pituitary microadenoma ([place]) 08/08/2014   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   Vitamin D deficiency 06/29/2014   Secondary adrenal insufficiency ([place]) 06/29/2014    History of tympanostomy tube placement 02/07/2014   Refusal of blood transfusions as patient is Jehovah's Witness 11/18/2013   Hepatitis C virus infection cured after antiviral drug therapy 03/16/2012   Health care maintenance 12/15/2011   Opioid dependence ([place]) 10/05/2010   HTN (hypertension) 10/05/2010   Hyperlipidemia associated with type 2 diabetes mellitus ([place]) 10/23/2008   HIV disease ([place]) 05/09/2006   Depression 05/09/2006   Allergic rhinitis 05/09/2006   GERD 05/09/2006    Conditions to be addressed/monitored per PCP order:   chronic healthcare management needs, HIV, headaches, HTN, DM2, asthma, Addison's disease, CKD, GERD.  Care Plan : General Plan of Care (Adult)  Updates made by Gayla Medicus, RN since 12/02/2020 12:00 AM     Problem: Health Promotion or Disease Self-Management (General Plan of Care)   Priority: Medium  Onset Date: 08/10/2020     Long-Range Goal: Self-Management Plan Developed   Start Date: 05/07/2020  Expected End Date: 02/05/2021  Recent Progress: On track  Priority: Medium  Note:   Current Barriers:  Chronic Disease Management support and education needs.  Patient with increased anxiety and wants to increase Wellbutrin dose.  Patient with an increase in left leg swelling-saw PCP-wearing compression stockings and elevating which helps. Patient helping to provide care to brother. This causes patient a lot of stress and anxiety.  She has recently started taking Wellbutrin which she states is helping.  Patient declines any therapy services at this time. Update 07-25-2020: Patient's brother died 10-Jul-2020.  Patient is considering grief counseling with Hospice, declines SW referral at this time. Update 08/10/20:  Patient states she is doing better-doesn't feel like she needs anything right now. Update 09/09/20:  Patient without complaint today.  Breathing and itching improved with medication adjustment. Update 10/06/20:  Patient doing well, no complaints  today, attending grief counseling as needed with Hospice. Update 11/05/20:  Patient speaking with grief counselor two times a month. Update 12/02/20:  Patient has increased Wellbutrin after speaking with her PCP to 300 mg and this is helping.  Left foot swelling, no change, elevates and wears compression stockings as needed.  Nurse Case Manager Clinical Goal(s):  Over the next 30 days, patient will attend all scheduled medical appointments. Over the next 30 days, patient will work with CM team pharmacist to review medications. Update 06/02/20:  Patient met with Pharmacist 05/11/20 and has follow up scheduled. Update Jul 25, 2020:  Patient continues to work with Pharmacist.  Interventions:  Inter-disciplinary care team collaboration (see longitudinal plan of care) Evaluation of current treatment plan and patient's adherence to plan as established by provider. Reviewed medications with patient. Collaborated with pharmacy regarding medications. Discussed plans with patient for ongoing care management follow up and provided patient with direct contact information for care management team Reviewed scheduled/upcoming provider appointments. Update 07-25-2020:  Patient has appointment with PCP 07/07/20. Update 09/09/20:  Patient attending all scheduled appointments. Update 11/05/20:  Patient will contact PCP regarding increasing Wellbutrin dose. Pharmacy referral for medication review-completed.  Patient Goals/Self-Care Activities Over the next 30 days, patient will:  -Attends all scheduled provider appointments Calls pharmacy for medication refills Calls provider office for new concerns or questions  Follow Up Plan: The Managed Medicaid care management team will reach out to the patient again over the next 30 days.  The patient has been provided with contact information for the Managed Medicaid care management team and has been advised to call with any health related questions or concerns.    Follow Up:   Patient agrees to Care Plan and Follow-up.  Plan: The Managed Medicaid care management team will reach out to the patient again over the next 30 days. and The patient has been provided with contact information for the Managed Medicaid care management team and has been advised to call with any health related questions or concerns.  Date/time of next scheduled RN care management/care coordination outreach:  12/30/20 at 1245.

## 2020-12-04 ENCOUNTER — Telehealth: Payer: Self-pay | Admitting: Allergy and Immunology

## 2020-12-04 DIAGNOSIS — E119 Type 2 diabetes mellitus without complications: Secondary | ICD-10-CM | POA: Diagnosis not present

## 2020-12-04 DIAGNOSIS — D352 Benign neoplasm of pituitary gland: Secondary | ICD-10-CM | POA: Diagnosis not present

## 2020-12-04 DIAGNOSIS — E2749 Other adrenocortical insufficiency: Secondary | ICD-10-CM | POA: Diagnosis not present

## 2020-12-04 NOTE — Telephone Encounter (Signed)
Please advise about an alternative to the Gabapentin. Can attempt a Prior Authorization for the [facility] and see what the outcome will be.

## 2020-12-04 NOTE — Telephone Encounter (Signed)
Patient is taking gabapentin for itching, but it is not working as well as it was when she first started. Patient would like to know if an alternative could be called in.   Also, [name] [name] is not covered by her insurance, so she is needing something else.  Uses [facility].  Please advise.

## 2020-12-07 ENCOUNTER — Other Ambulatory Visit: Payer: Self-pay | Admitting: *Deleted

## 2020-12-07 MED ORDER — TRELEGY ELLIPTA 200-62.5-25 MCG/INH IN AEPB: 1.0000 | INHALATION_SPRAY | Freq: Every day | RESPIRATORY_TRACT | 5 refills | Status: DC

## 2020-12-07 MED ORDER — NEURONTIN 100 MG PO CAPS: 200.0000 mg | ORAL_CAPSULE | Freq: Two times a day (BID) | ORAL | 5 refills | Status: DC

## 2020-12-07 NOTE — Telephone Encounter (Signed)
New prescriptions have been sent in. Called patient and advised of change in medications and that they were sent to the pharmacy. Patient verbalized understanding.

## 2020-12-07 NOTE — Telephone Encounter (Signed)
I believe that she should be using gabapentin 100 mg 3 times per day.  That would be 300 mg for the day.  We can have her try 200 mg twice a day which would be 400 mg for the day.  Concerning her insurance denial for [name] [name], will her insurance cover trelegy?

## 2020-12-07 NOTE — Telephone Encounter (Signed)
New prescription for Gabapentin has been sent in to the patient's pharmacy, it does appear that Trelegy is covered by the patient's insurance, please advise new prescription details. Thank You.

## 2020-12-09 ENCOUNTER — Other Ambulatory Visit: Payer: Self-pay | Admitting: Infectious Diseases

## 2020-12-09 ENCOUNTER — Other Ambulatory Visit: Payer: Self-pay | Admitting: Student

## 2020-12-09 ENCOUNTER — Other Ambulatory Visit: Payer: Self-pay | Admitting: Internal Medicine

## 2020-12-09 DIAGNOSIS — B20 Human immunodeficiency virus [HIV] disease: Secondary | ICD-10-CM

## 2020-12-09 DIAGNOSIS — I10 Essential (primary) hypertension: Secondary | ICD-10-CM

## 2020-12-09 DIAGNOSIS — K219 Gastro-esophageal reflux disease without esophagitis: Secondary | ICD-10-CM

## 2020-12-14 ENCOUNTER — Other Ambulatory Visit: Payer: Self-pay | Admitting: *Deleted

## 2020-12-14 NOTE — Telephone Encounter (Signed)
Will work on prior authorizations for these first before switching medications. Will call patient in the morning and see if she would like to come in and get samples until we get things straightened out.

## 2020-12-14 NOTE — Telephone Encounter (Signed)
Patient called to say her insurance will not pay for [facility] or Trelegy. She has Medicaid. She would like to know if there are alternatives for these medications.

## 2020-12-15 NOTE — Telephone Encounter (Signed)
Prior Authorization has been submitted through CoverMyMeds for Trelegy 200. Called the patient and advised. Patient verbalized understanding and was fine with picking up samples until then. She is having surgery tomorrow so she will have someone come by and pick up the samples for her. Trelegy 200 samples have been placed up front in the [place] office for pickup.

## 2020-12-18 NOTE — Telephone Encounter (Signed)
Patient called and wanted to let the office know she has not been able to come by and pick up the samples yet. She states she will come by as soon as she can but to please hold the samples at the front a little longer.

## 2020-12-24 NOTE — Telephone Encounter (Signed)
Patient came in and picked up samples

## 2020-12-30 ENCOUNTER — Other Ambulatory Visit: Payer: Self-pay

## 2020-12-30 ENCOUNTER — Other Ambulatory Visit: Payer: Self-pay | Admitting: Obstetrics and Gynecology

## 2020-12-30 NOTE — Patient Outreach (Signed)
Medicaid Managed Care   Nurse Care Manager Note  12/30/2020 Name:  [name] [name] MRN:  [id number] DOB:  1960-11-02  [name] [name] is an 60 y.o. year old female who is a primary patient of [name], MD.  The [facility] Managed Care Coordination team was consulted for assistance with:    Chronic healthcare management needs.  Ms. Huyett was given information about Medicaid Managed Care Coordination team services today. [name] [name] Patient agreed to services and verbal consent obtained.  Engaged with patient by telephone for follow up visit in response to provider referral for case management and/or care coordination services.   Assessments/Interventions:  Review of past medical history, allergies, medications, health status, including review of consultants reports, laboratory and other test data, was performed as part of comprehensive evaluation and provision of chronic care management services.  SDOH (Social Determinants of Health) assessments and interventions performed:   Care Plan  Allergies  Allergen Reactions   Acetaminophen Other (See Comments)    Inflamed liver, hospitalized REACTION: Had liver problems, was hospitalized Inflamed liver, hospitalized Other reaction(s): liver demage Other reaction(s): liver demage Other reaction(s): liver demage Other reaction(s): liver demage Other reaction(s): liver demage Other reaction(s): liver demage Other reaction(s): liver demage   Morphine Hives and Rash    REACTION: rash   Triamterene Hives   Aspirin-Caffeine     Other reaction(s): liver demage Other reaction(s): liver demage Other reaction(s): liver demage   Citalopram     Other reaction(s): INCREASED ANXIETY Other reaction(s): INCREASED ANXIETY   Citalopram Hydrobromide     Other reaction(s): INCREASED ANXIETY   Dyazide [Hydrochlorothiazide W-Triamterene] Hives   Empagliflozin     Other reaction(s): stomach ache, diarrhea Other reaction(s): stomach ache,  diarrhea Other reaction(s): stomach ache, diarrhea   Emtricitabine-Tenofovir Df     Other reaction(s): rash Other reaction(s): rash Other reaction(s): rash   Lisinopril     Cough    Metformin Hcl Er     Other reaction(s): upset stomach Other reaction(s): upset stomach   Morphine Sulfate     Other reaction(s): shortness of breath and hives Other reaction(s): shortness of breath and hives Other reaction(s): shortness of breath and hives   Topamax [Topiramate]    Triamterene-Hctz     Other reaction(s): rash Other reaction(s): rash Other reaction(s): rash   Losartan Rash    Pt had rash, worsening dizziness, and nausea after starting losartan, which improved after stopping losartan    Medications Reviewed Today     Reviewed by Gayla Medicus, RN (Registered Nurse) on 12/30/20 at [place] List Status: <None>   Medication Order Taking? Sig Documenting Provider Last Dose Status Informant  amLODipine (NORVASC) 10 MG tablet [id number] No Take 1 tablet (10 mg total) by mouth daily. [name], MD Taking Active   atorvastatin (LIPITOR) 10 MG tablet [id number] No TAKE HALF TABLET BY MOUTH DAILY Katsadouros, Vasilios, MD Taking Active   BIKTARVY 50-200-25 MG TABS tablet [id number]  TAKE 1 TABLET BY MOUTH DAILY. Augusta Callas, NP  Active   Budeson-Glycopyrrol-Formoterol (BREZTRI AEROSPHERE) 160-9-4.8 MCG/ACT Hollie Salk [id number] No Inhale 2 puffs into the lungs in the morning and at bedtime. On empty lungs Kozlow, Donnamarie Poag, MD Taking Active   buPROPion (WELLBUTRIN XL) 300 MG 24 hr tablet [id number]  Take 1 tablet (300 mg total) by mouth daily. [name], MD  Active   cetirizine (ZYRTEC) 10 MG tablet [id number] No 1 tablet daily Kozlow, Donnamarie Poag, MD Taking Active   cholecalciferol (VITAMIN D)  25 MCG (1000 UNIT) tablet [id number]  TAKE ONE TABLET BY MOUTH DAILY [name], MD  Active   Elastic Bandages & Supports (WRIST/THUMB SPLINT/RIGHT MED) MISC [id number] No Wear on R thumb/wrist.  Ladona Horns, MD Taking Active   famotidine (PEPCID) 40 MG tablet [id number] No Take 1 tablet (40 mg total) by mouth at bedtime. Kozlow, Donnamarie Poag, MD Taking Active   gabapentin (NEURONTIN) 100 MG capsule [id number] No Take 1 capsule (100 mg total) by mouth 3 (three) times daily. Kozlow, Donnamarie Poag, MD Taking Active   hydrocortisone (CORTEF) 10 MG tablet [id number] No Take 10 mg by mouth as directed. Pt takes 10 mg in the am and 5 mg at 11 am and 3 pm [provider] Taking Active            Med Note Bonnita Levan May 11, 2020 11:25 AM) Only taking BID, morning and noon. Taking in evening makes her gain too much weight  liraglutide (VICTOZA) 18 MG/3ML SOPN [id number] No Inject into the skin. [provider] Taking Active   metoprolol tartrate (LOPRESSOR) 25 MG tablet [id number]  TAKE ONE TABLET BY MOUTH TWICE A DAY [name], MD  Active   montelukast (SINGULAIR) 10 MG tablet [id number] No Take 1 tablet (10 mg total) by mouth at bedtime. Jiles Prows, MD Taking Active   NEURONTIN 100 MG capsule [id number]  Take 2 capsules (200 mg total) by mouth 2 (two) times daily. Kozlow, Donnamarie Poag, MD  Active   nitroGLYCERIN (NITROSTAT) 0.4 MG SL tablet [id number] No Place 1 tablet (0.4 mg total) under the tongue every 5 (five) minutes as needed for chest pain. Isaiah Serge, NP Taking Active   nortriptyline (PAMELOR) 10 MG capsule [id number] No Take 2 capsules (20 mg total) by mouth at bedtime. Suzzanne Cloud, NP Taking Active   pantoprazole (PROTONIX) 40 MG tablet [id number]  TAKE 1 TABLET (40 MG TOTAL) BY MOUTH TWO (TWO) TIMES DAILY. [name], MD  Active   potassium chloride SA (KLOR-CON) 20 MEQ tablet [id number] No Take 20 mEq by mouth daily. [provider] Taking Active   PROAIR HFA 108 [id number] Base) MCG/ACT inhaler [id number] No INHALE 1-2 PUFFS BY MOUTH INTO THE LUNGS EVERY 4 HOURS AS NEEDED FOR WHEEZING OR SHORTNESS OF BREATH Kozlow, Donnamarie Poag, MD Taking Active   promethazine  (PHENERGAN) 25 MG tablet [id number] No Take 1 tablet (25 mg total) by mouth every 6 (six) hours as needed for nausea or vomiting. [name], DO Taking Active   sodium chloride (OCEAN) 0.65 % SOLN nasal spray [id number] No PLACE 1 SPRAY INTO BOTH NOSTRILS AS NEEDED FOR CONGESTION. Kalman Shan [place], DO Taking Active   TRELEGY ELLIPTA 200-62.5-25 MCG/INH AEPB [id number]  Inhale 1 puff into the lungs daily. Kozlow, Donnamarie Poag, MD  Active   triamcinolone (NASACORT) 55 MCG/ACT AERO nasal inhaler [id number] No 1 spray each nostril 2 times per day Jiles Prows, MD Taking Active             Patient Active Problem List   Diagnosis Date Noted   Left leg swelling 07/09/2020   Grief reaction 06/08/2020   Arm pain 02/10/2020   Dizziness on standing 12/27/2019   Generalized anxiety disorder with panic attacks 06/20/2019   Osteoarthritis of thumb, right 11/29/2018   Chronic migraine w/o aura w/o status migrainosus, not intractable 08/27/2018   Disorder of left eustachian tube 05/19/2017   Vertigo of central origin 01/23/2017   Central  perforation of tympanic membrane of left ear 12/09/2016   Eustachian tube dysfunction, bilateral 2016-07-06   Ear pain, right 07/06/16   Spondylosis of lumbar region without myelopathy or radiculopathy 10/08/2015   BPPV (benign paroxysmal positional vertigo) 10/02/2015   Liver fibrosis ([place]) 12/04/2014   Pituitary microadenoma ([place]) 08/08/2014   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   Vitamin D deficiency 06/29/2014   Secondary adrenal insufficiency ([place]) 06/29/2014   History of tympanostomy tube placement 02/07/2014   Refusal of blood transfusions as patient is Jehovah's Witness 11/18/2013   Hepatitis C virus infection cured after antiviral drug therapy 03/16/2012   Health care maintenance 12/15/2011   Opioid dependence ([place]) 10/05/2010   HTN (hypertension) 10/05/2010   Hyperlipidemia associated with type 2 diabetes mellitus ([place])  10/23/2008   HIV disease ([place]) 05/09/2006   Depression 05/09/2006   Allergic rhinitis 05/09/2006   GERD 05/09/2006    Conditions to be addressed/monitored per PCP order:   chronic healthcare management needs, H/A, asthma, DM2,h istory of Rx Hepatitis C, HIV followed by ID Dr Johnnye Sima. Pituitary adenoma with Addison's, History of GERD, CKD and pancreatitis.  Jehovah's witness no blood products. History of atypical chest pain with normal stress echo in 2016 and normal Lexiscan Myovue in 2018 GERD with difficulty laying flat due to reflux.  Care Plan : General Plan of Care (Adult)  Updates made by Gayla Medicus, RN since 12/30/2020 12:00 AM     Problem: Health Promotion or Disease Self-Management (General Plan of Care)   Priority: Medium  Onset Date: 08/10/2020     Long-Range Goal: Self-Management Plan Developed   Start Date: 05/07/2020  Expected End Date: 02/05/2021  Recent Progress: On track  Priority: Medium  Note:   Current Barriers:  Chronic Disease Management support and education needs.  Patient with increased anxiety and wants to increase Wellbutrin dose.  Patient with an increase in left leg swelling-saw PCP-wearing compression stockings and elevating which helps. Patient helping to provide care to brother. This causes patient a lot of stress and anxiety.  She has recently started taking Wellbutrin which she states is helping.  Patient declines any therapy services at this time. Update 2020/07/21: Patient's brother died 07-06-20.  Patient is considering grief counseling with Hospice, declines SW referral at this time. Update 08/10/20:  Patient states she is doing better-doesn't feel like she needs anything right now. Update 09/09/20:  Patient without complaint today.  Breathing and itching improved with medication adjustment. Update 10/06/20:  Patient doing well, no complaints today, attending grief counseling as needed with Hospice. Update 11/05/20:  Patient speaking with grief counselor two  times a month. Update 12/02/20:  Patient has increased Wellbutrin after speaking with her PCP to 300 mg and this is helping.  Left foot swelling, no change, elevates and wears compression stockings as needed. Update 12/30/20:  Patient without complaint today-left foot swelling less.  Nurse Case Manager Clinical Goal(s):  Over the next 30 days, patient will attend all scheduled medical appointments. Over the next 30 days, patient will work with CM team pharmacist to review medications. Update 06/02/20:  Patient met with Pharmacist 05/11/20 and has follow up scheduled. Update 07/21/2020:  Patient continues to work with Pharmacist.  Interventions:  Inter-disciplinary care team collaboration (see longitudinal plan of care) Evaluation of current treatment plan and patient's adherence to plan as established by provider. Reviewed medications with patient. Collaborated with pharmacy regarding medications. Discussed plans with patient for ongoing care management follow up and provided patient with direct contact  information for care management team Reviewed scheduled/upcoming provider appointments. Update 06/30/20:  Patient has appointment with PCP 07/07/20. Update 09/09/20:  Patient attending all scheduled appointments. Update 11/05/20:  Patient will contact PCP regarding increasing Wellbutrin dose-completed. Pharmacy referral for medication review-completed.  Patient Goals/Self-Care Activities Over the next 30 days, patient will:  -Attends all scheduled provider appointments Calls pharmacy for medication refills Calls provider office for new concerns or questions  Follow Up Plan: The Managed Medicaid care management team will reach out to the patient again over the next 30 days.  The patient has been provided with contact information for the Managed Medicaid care management team and has been advised to call with any health related questions or concerns.    Follow Up:  Patient agrees to Care Plan and  Follow-up.  Plan: The Managed Medicaid care management team will reach out to the patient again over the next 30 days. and The patient has been provided with contact information for the Managed Medicaid care management team and has been advised to call with any health related questions or concerns.  Date/time of next scheduled RN care management/care coordination outreach: 01/29/21 at 0900.

## 2020-12-30 NOTE — Patient Instructions (Signed)
Hi [name] [name] for speaking with me today.  [name] [name] was given information about Medicaid Managed Care team care coordination services as a part of their [place] Medicaid benefit. [name] [name] verbally consented to engagement with the [facility] Managed Care team.   If [name] are experiencing a medical emergency, please call 911 or report to your local emergency department or urgent care.   If [name] have a non-emergency medical problem during routine business hours, please contact your provider's office and ask to speak with a nurse.   For questions related to your [facility], please call: [phone] or visit the homepage here: [email or web address]  If [name] would like to schedule transportation through your [facility], please call the following number at least 2 days in advance of your appointment: [phone].   Call the [place] at [phone], at any time, 24 hours a day, 7 days a week. If [name] are in danger or need immediate medical attention call 911.  If [name] would like help to quit smoking, call 1-800-QUIT-NOW (249) 379-8745) OR Espaol: 1-855-Djelo-Ya (0-240-973-5329) o para ms informacin haga clic aqu or Text READY to 200-400 to register via text  [name] [name] - following are the goals we discussed in your visit today:   Goals Addressed             This Visit's Progress    Protect My Health       Timeframe:  Long-Range Goal Priority:  Medium Start Date:      05/07/20                       Expected End Date: ongoing             Follow Up Date: 01/29/21   - schedule appointment for vaccines needed due to my age or health - schedule recommended health tests (blood work, mammogram, colonoscopy, pap test) - schedule and keep appointment for annual check-up    Why is this important?   Screening tests can find diseases  early when they are easier to treat.  Your doctor or nurse will talk with [name] about which tests are important for [name].  Getting shots for common diseases like the flu and shingles will help prevent them.     Update 06/30/20:  Patient has appointment with PCP 07/07/20. Update 08/10/20:  Patient attending all appointments. Update 09/09/20:  Patient with no complaints, attending all appointments. Update 10/10/20:Patient doing well today, attending grief counseling as needed. Update 11/05/20:  Grief counseling twice a month, increased anxiety and wants to increase dose of Wellbutrin-patient will contact her PCP. Update 12/02/20:  Patient keeping all appointments-has increased Wellbutrin and this is helpful.  Has appointment with Dr. Buddy Duty this week. Update 12/30/20:  Patient without complaint today-recently had oral surgery-getting new partial.        The patient verbalized understanding of instructions provided today and declined a print copy of patient instruction materials.   The Managed Medicaid care management team will reach out to the patient again over the next 30 days.  The  Patient has been provided with contact information for the Managed Medicaid care management team and has been advised to call with any health related questions or concerns.   Aida Raider RN, BSN [place]  [facility] - Managed Medicaid High Risk [phone].    Following is a copy of your plan of care:  Patient Care Plan: General Plan of Care (Adult)     Problem Identified: Health Promotion or Disease Self-Management (General Plan of Care)   Priority: Medium  Onset Date: 08/10/2020     Long-Range Goal: Self-Management Plan Developed   Start Date: 05/07/2020  Expected End Date: 02/05/2021  Recent Progress: On track  Priority: Medium  Note:   Current Barriers:  Chronic Disease Management support and education needs.  Patient with increased anxiety and wants to  increase Wellbutrin dose.  Patient with an increase in left leg swelling-saw PCP-wearing compression stockings and elevating which helps. Patient helping to provide care to brother. This causes patient a lot of stress and anxiety.  She has recently started taking Wellbutrin which she states is helping.  Patient declines any therapy services at this time. Update Jul 03, 2020: Patient's brother died 06-18-2020.  Patient is considering grief counseling with Hospice, declines SW referral at this time. Update 08/10/20:  Patient states she is doing better-doesn't feel like she needs anything right now. Update 09/09/20:  Patient without complaint today.  Breathing and itching improved with medication adjustment. Update 10/06/20:  Patient doing well, no complaints today, attending grief counseling as needed with Hospice. Update 11/05/20:  Patient speaking with grief counselor two times a month. Update 12/02/20:  Patient has increased Wellbutrin after speaking with her PCP to 300 mg and this is helping.  Left foot swelling, no change, elevates and wears compression stockings as needed. Update 12/30/20:  Patient without complaint today-left foot swelling less.  Nurse Case Manager Clinical Goal(s):  Over the next 30 days, patient will attend all scheduled medical appointments. Over the next 30 days, patient will work with CM team pharmacist to review medications. Update 06/02/20:  Patient met with Pharmacist 05/11/20 and has follow up scheduled. Update July 03, 2020:  Patient continues to work with Pharmacist.  Interventions:  Inter-disciplinary care team collaboration (see longitudinal plan of care) Evaluation of current treatment plan and patient's adherence to plan as established by provider. Reviewed medications with patient. Collaborated with pharmacy regarding medications. Discussed plans with patient for ongoing care management follow up and provided patient with direct contact information for care management team Reviewed  scheduled/upcoming provider appointments. Update Jul 03, 2020:  Patient has appointment with PCP 07/07/20. Update 09/09/20:  Patient attending all scheduled appointments. Update 11/05/20:  Patient will contact PCP regarding increasing Wellbutrin dose-completed. Pharmacy referral for medication review-completed.  Patient Goals/Self-Care Activities Over the next 30 days, patient will:  -Attends all scheduled provider appointments Calls pharmacy for medication refills Calls provider office for new concerns or questions  Follow Up Plan: The Managed Medicaid care management team will reach out to the patient again over the next 30 days.  The patient has been provided with contact information for the Managed Medicaid care management team and has been advised to call with any health related questions or concerns.

## 2021-01-04 LAB — HEMOGLOBIN A1C: Hemoglobin-A1c: 6.5

## 2021-01-05 ENCOUNTER — Encounter: Payer: Self-pay | Admitting: Physical Medicine & Rehabilitation

## 2021-01-05 ENCOUNTER — Encounter: Payer: Medicaid Other | Attending: Physical Medicine & Rehabilitation | Admitting: Physical Medicine & Rehabilitation

## 2021-01-05 ENCOUNTER — Other Ambulatory Visit: Payer: Self-pay

## 2021-01-05 VITALS — Temp 98.6°F | Ht 61.0 in | Wt 183.0 lb

## 2021-01-05 DIAGNOSIS — M533 Sacrococcygeal disorders, not elsewhere classified: Secondary | ICD-10-CM | POA: Diagnosis not present

## 2021-01-05 NOTE — Progress Notes (Signed)
Bilateral sacroiliac injections under fluoroscopic guidance  Indication: Low back and buttocks pain not relieved by medication management and other conservative care.Has had failure of PT and OTC meds.  Hx HIV, Last CD4 05/21/20 was 553/uL (>400)  Informed consent was obtained after describing risks and benefits of the procedure with the patient, this includes bleeding, bruising, infection, paralysis and medication side effects. The patient wishes to proceed and has given written consent. The patient was placed in a prone position. The lumbar and sacral area was marked and prepped with Betadine. A 25-gauge 1-1/2 inch needle was inserted into the skin and subcutaneous tissue and 1 mL of 1% lidocaine was injected into each side. Then a 25-gauge 3 inch spinal needle was inserted under fluoroscopic guidance into the left sacroiliac joint. AP and lateral images were utilized. Omnipaque 180x0.5 mL under live fluoroscopy demonstrated no intravascular uptake. Then a solution containing one ML of 6 mg per mL Celestone in 2 ML of 2% lidocaine MPF was injected x1.5 mL. This same procedure was repeated on the right side using the same needle, injectate, and technique. Patient tolerated the procedure well. Post procedure instructions were given. Please see post procedure form.

## 2021-01-05 NOTE — Progress Notes (Signed)
  PROCEDURE RECORD [facility]   Name: [name] [name] DOB:17-Nov-1960 MRN: [id number]  Date:01/05/2021  Physician: Alysia Penna, MD    Nurse/CMA: Truman Hayward, CMA  Allergies:  Allergies  Allergen Reactions   Acetaminophen Other (See Comments)    Inflamed liver, hospitalized REACTION: Had liver problems, was hospitalized Inflamed liver, hospitalized Other reaction(s): liver demage Other reaction(s): liver demage Other reaction(s): liver demage Other reaction(s): liver demage Other reaction(s): liver demage Other reaction(s): liver demage Other reaction(s): liver demage   Morphine Hives and Rash    REACTION: rash   Triamterene Hives   Aspirin-Caffeine     Other reaction(s): liver demage Other reaction(s): liver demage Other reaction(s): liver demage   Citalopram     Other reaction(s): INCREASED ANXIETY Other reaction(s): INCREASED ANXIETY   Citalopram Hydrobromide     Other reaction(s): INCREASED ANXIETY   Dyazide [Hydrochlorothiazide W-Triamterene] Hives   Empagliflozin     Other reaction(s): stomach ache, diarrhea Other reaction(s): stomach ache, diarrhea Other reaction(s): stomach ache, diarrhea   Emtricitabine-Tenofovir Df     Other reaction(s): rash Other reaction(s): rash Other reaction(s): rash   Lisinopril     Cough    Metformin Hcl Er     Other reaction(s): upset stomach Other reaction(s): upset stomach   Morphine Sulfate     Other reaction(s): shortness of breath and hives Other reaction(s): shortness of breath and hives Other reaction(s): shortness of breath and hives   Topamax [Topiramate]    Triamterene-Hctz     Other reaction(s): rash Other reaction(s): rash Other reaction(s): rash   Losartan Rash    Pt had rash, worsening dizziness, and nausea after starting losartan, which improved after stopping losartan    Consent Signed: Yes.    Is patient diabetic? Yes.    CBG today? 80  Pregnant: No. LMP: No LMP  recorded. Patient is postmenopausal. (age 76-55)  Anticoagulants: no Anti-inflammatory: no Antibiotics: no  Procedure: Bilateral Sacroiliac Injection  Position: Prone Start Time: 11:25 am  End Time: 11:31 am  Fluoro Time: 47  RN/CMA Truman Hayward, CMA Irma Roulhac, CMA    Time 10:08am 11:40 am    BP 156/92 153/92    Pulse 81 85    Respirations 16 16    O2 Sat 96 98    S/S 6 6    Pain Level 10/10 7/10     D/C home with transportation, [name] & O X 3, D/C instructions reviewed, and sits independently.

## 2021-01-05 NOTE — Patient Instructions (Signed)
Sacroiliac injection was performed today. A combination of numbing medicine (lidocaine) plus a cortisone medicine (betamethasone) was injected. The injection was done under x-ray guidance. This procedure has been performed to help reduce low back and buttocks pain as well as potentially hip pain. The duration of this injection is variable lasting from hours to  Months. It may repeated if needed. 

## 2021-01-11 ENCOUNTER — Other Ambulatory Visit: Payer: Self-pay | Admitting: Student

## 2021-01-11 ENCOUNTER — Other Ambulatory Visit: Payer: Self-pay | Admitting: Allergy and Immunology

## 2021-01-11 DIAGNOSIS — I10 Essential (primary) hypertension: Secondary | ICD-10-CM

## 2021-01-12 ENCOUNTER — Other Ambulatory Visit: Payer: Self-pay | Admitting: Student

## 2021-01-27 ENCOUNTER — Other Ambulatory Visit: Payer: Self-pay

## 2021-01-27 DIAGNOSIS — Z113 Encounter for screening for infections with a predominantly sexual mode of transmission: Secondary | ICD-10-CM

## 2021-01-27 DIAGNOSIS — Z79899 Other long term (current) drug therapy: Secondary | ICD-10-CM

## 2021-01-27 DIAGNOSIS — B20 Human immunodeficiency virus [HIV] disease: Secondary | ICD-10-CM

## 2021-01-29 ENCOUNTER — Other Ambulatory Visit: Payer: Self-pay | Admitting: Obstetrics and Gynecology

## 2021-01-29 ENCOUNTER — Other Ambulatory Visit: Payer: Self-pay

## 2021-01-29 NOTE — Patient Instructions (Signed)
Hi [name] [name], thank you for speaking with me this morning, have a great day!  [name] [name] was given information about Medicaid Managed Care team care coordination services as a part of their [place] Medicaid benefit. [name] [name] verbally consented to engagement with the [facility] Managed Care team.   If you are experiencing a medical emergency, please call 911 or report to your local emergency department or urgent care.   If you have a non-emergency medical problem during routine business hours, please contact your provider's office and ask to speak with a nurse.   For questions related to your [facility], please call: [phone] or visit the homepage here: [email or web address]  If you would like to schedule transportation through your [facility], please call the following number at least 2 days in advance of your appointment: [phone].   Call the [place] at [phone], at any time, 24 hours a day, 7 days a week. If you are in danger or need immediate medical attention call 911.  If you would like help to quit smoking, call 1-800-QUIT-NOW 939-391-3856) OR Espaol: 1-855-Djelo-Ya (5-916-384-6659) o para ms informacin haga clic aqu or Text READY to 200-400 to register via text  [name] [name] - following are the goals we discussed in your visit today:   Goals Addressed             This Visit's Progress    Protect My Health       Timeframe:  Long-Range Goal Priority:  Medium Start Date:      05/07/20                       Expected End Date: ongoing             Follow Up Date: 03/02/21   - schedule appointment for vaccines needed due to my age or health - schedule recommended health tests (blood work, mammogram, colonoscopy, pap test) - schedule and keep appointment for annual check-up    Why is this important?   Screening  tests can find diseases early when they are easier to treat.  Your doctor or nurse will talk with you about which tests are important for you.  Getting shots for common diseases like the flu and shingles will help prevent them.     Update 06/30/20:  Patient has appointment with PCP 07/07/20. Update 08/10/20:  Patient attending all appointments. Update 09/09/20:  Patient with no complaints, attending all appointments. Update 10/10/20:Patient doing well today, attending grief counseling as needed. Update 11/05/20:  Grief counseling twice a month, increased anxiety and wants to increase dose of Wellbutrin-patient will contact her PCP. Update 12/02/20:  Patient keeping all appointments-has increased Wellbutrin and this is helpful.  Has appointment with Dr. Buddy Duty this week. Update 12/30/20:  Patient without complaint today-recently had oral surgery-getting new partial. Update 01/29/21:  C/O left arm pain and dizziness today-will schedule appointments with provider.        The patient verbalized understanding of instructions provided today and declined a print copy of patient instruction materials.   The Managed Medicaid care management team will reach out to the patient again over the next 30 days.  The  Patient has been provided with contact information for the Managed Medicaid care management team and has been advised to call with any health related questions or concerns.   Aida Raider RN, BSN [place]  Management Coordinator - Managed Medicaid High Risk [phone].      Following is a copy of your plan of care:  Patient Care Plan: General Plan of Care (Adult)     Problem Identified: Health Promotion or Disease Self-Management (General Plan of Care)   Priority: Medium  Onset Date: 08/10/2020     Long-Range Goal: Self-Management Plan Developed   Start Date: 05/07/2020  Expected End Date: 05/01/2021  Recent Progress: On track  Priority: Medium  Note:    Current Barriers:  Chronic Disease Management support and education needs.  Patient with increased anxiety and wants to increase Wellbutrin dose.  Patient with an increase in left leg swelling-saw PCP-wearing compression stockings and elevating which helps. Patient helping to provide care to brother. This causes patient a lot of stress and anxiety.  She has recently started taking Wellbutrin which she states is helping.  Patient declines any therapy services at this time. Update 2020/07/13: Patient's brother died 06/28/20.  Patient is considering grief counseling with Hospice, declines SW referral at this time. Update 08/10/20:  Patient states she is doing better-doesn't feel like she needs anything right now. Update 09/09/20:  Patient without complaint today.  Breathing and itching improved with medication adjustment. Update 10/06/20:  Patient doing well, no complaints today, attending grief counseling as needed with Hospice. Update 11/05/20:  Patient speaking with grief counselor two times a month. Update 12/02/20:  Patient has increased Wellbutrin after speaking with her PCP to 300 mg and this is helping.  Left foot swelling, no change, elevates and wears compression stockings as needed. Update 12/30/20:  Patient without complaint today-left foot swelling less. Update 01/29/21:  Patient c/o left arm pain and dizziness today-states she will contact providers to schedule appointment.  Transportation issues, insurance transportation information provided-has [employer] transportation information already.  Nurse Case Manager Clinical Goal(s):  Over the next 30 days, patient will attend all scheduled medical appointments-patient will schedule appointments with providers to evaluate left arm pain and dizziness. Over the next 30 days, patient will work with CM team pharmacist to review medications. Update 06/02/20:  Patient met with Pharmacist 05/11/20 and has follow up scheduled. Update 2020-07-13:  Patient continues to  work with Pharmacist.  Interventions:  Inter-disciplinary care team collaboration (see longitudinal plan of care) Evaluation of current treatment plan and patient's adherence to plan as established by provider. Reviewed medications with patient. Collaborated with pharmacy regarding medications. Discussed plans with patient for ongoing care management follow up and provided patient with direct contact information for care management team Reviewed scheduled/upcoming provider appointments. Update 2020/07/13:  Patient has appointment with PCP 07/07/20. Update 09/09/20:  Patient attending all scheduled appointments. Update 11/05/20:  Patient will contact PCP regarding increasing Wellbutrin dose-completed. Update 01/29/21:  Patient will schedule appointments with providers for evaluation of left arm pain and dizziness. Pharmacy referral for medication review-completed.  Patient Goals/Self-Care Activities Over the next 30 days, patient will:  -Attends all scheduled provider appointments Calls pharmacy for medication refills Calls provider office for new concerns or questions  Follow Up Plan: The Managed Medicaid care management team will reach out to the patient again over the next 30 days.  The patient has been provided with contact information for the Managed Medicaid care management team and has been advised to call with any health related questions or concerns.

## 2021-01-29 NOTE — Patient Outreach (Signed)
Medicaid Managed Care   Nurse Care Manager Note  01/29/2021 Name:  [name] [name] MRN:  [id number] DOB:  1961/02/02  [name] [name] is an 60 y.o. year old female who is a primary patient of [name], MD.  The [facility] Managed Care Coordination team was consulted for assistance with:    Chronic healthcare management needs  [name] [name] was given information about Medicaid Managed Care Coordination team services today. [name] [name] Patient agreed to services and verbal consent obtained.  Engaged with patient by telephone for follow up visit in response to provider referral for case management and/or care coordination services.   Assessments/Interventions:  Review of past medical history, allergies, medications, health status, including review of consultants reports, laboratory and other test data, was performed as part of comprehensive evaluation and provision of chronic care management services.  SDOH (Social Determinants of Health) assessments and interventions performed: SDOH Interventions    Flowsheet Row Most Recent Value  SDOH Interventions   Intimate Partner Violence Interventions Intervention Not Indicated  Depression Interventions/Treatment  --  [currently receiving therapy]       Care Plan  Allergies  Allergen Reactions   Acetaminophen Other (See Comments)    Inflamed liver, hospitalized REACTION: Had liver problems, was hospitalized Inflamed liver, hospitalized Other reaction(s): liver demage Other reaction(s): liver demage Other reaction(s): liver demage Other reaction(s): liver demage Other reaction(s): liver demage Other reaction(s): liver demage Other reaction(s): liver demage   Morphine Hives and Rash    REACTION: rash   Triamterene Hives   Aspirin-Caffeine     Other reaction(s): liver demage Other reaction(s): liver demage Other reaction(s): liver demage   Citalopram     Other reaction(s): INCREASED ANXIETY Other reaction(s): INCREASED ANXIETY    Citalopram Hydrobromide     Other reaction(s): INCREASED ANXIETY   Dyazide [Hydrochlorothiazide W-Triamterene] Hives   Empagliflozin     Other reaction(s): stomach ache, diarrhea Other reaction(s): stomach ache, diarrhea Other reaction(s): stomach ache, diarrhea   Emtricitabine-Tenofovir Df     Other reaction(s): rash Other reaction(s): rash Other reaction(s): rash   Lisinopril     Cough    Metformin Hcl Er     Other reaction(s): upset stomach Other reaction(s): upset stomach   Morphine Sulfate     Other reaction(s): shortness of breath and hives Other reaction(s): shortness of breath and hives Other reaction(s): shortness of breath and hives   Topamax [Topiramate]    Triamterene-Hctz     Other reaction(s): rash Other reaction(s): rash Other reaction(s): rash   Losartan Rash    Pt had rash, worsening dizziness, and nausea after starting losartan, which improved after stopping losartan    Medications Reviewed Today     Reviewed by Gayla Medicus, RN (Registered Nurse) on 01/29/21 at [place] List Status: <None>   Medication Order Taking? Sig Documenting Provider Last Dose Status Informant  amLODipine (NORVASC) 10 MG tablet [id number]  TAKE 1 TABLET (10 MG TOTAL) BY MOUTH DAILY. [name], MD  Active   atorvastatin (LIPITOR) 10 MG tablet [id number]  TAKE HALF TABLET BY MOUTH DAILY Katsadouros, Vasilios, MD  Active   BIKTARVY 50-200-25 MG TABS tablet [id number]  TAKE 1 TABLET BY MOUTH DAILY. Lasara Callas, NP  Active   Budeson-Glycopyrrol-Formoterol (BREZTRI AEROSPHERE) 160-9-4.8 MCG/ACT Hollie Salk [id number]  Inhale 2 puffs into the lungs in the morning and at bedtime. On empty lungs Kozlow, Donnamarie Poag, MD  Active   buPROPion (WELLBUTRIN XL) 300 MG 24 hr tablet [id number]  Take 1  tablet (300 mg total) by mouth daily. [name], MD  Active   cetirizine (ZYRTEC) 10 MG tablet [id number]  1 tablet daily Kozlow, Donnamarie Poag, MD  Active   cholecalciferol (VITAMIN D) 25 MCG (1000  UNIT) tablet [id number]  TAKE ONE TABLET BY MOUTH DAILY [name], MD  Active   Elastic Bandages & Supports (WRIST/THUMB SPLINT/RIGHT MED) MISC [id number]  Wear on R thumb/wrist. Ladona Horns, MD  Active   famotidine (PEPCID) 40 MG tablet [id number]  Take 1 tablet (40 mg total) by mouth at bedtime. Kozlow, Donnamarie Poag, MD  Active   gabapentin (NEURONTIN) 100 MG capsule [id number]  Take 1 capsule (100 mg total) by mouth 3 (three) times daily. Kozlow, Donnamarie Poag, MD  Active   hydrocortisone (CORTEF) 10 MG tablet [id number]  Take 10 mg by mouth as directed. Pt takes once a day in morning. [provider]  Active Self           Med Note (Leondra Cullin, Lorel Monaco   Fri Jan 29, 2021  9:22 AM)    liraglutide (VICTOZA) 18 MG/3ML SOPN [id number]  Inject into the skin. [provider]  Active   metoprolol tartrate (LOPRESSOR) 25 MG tablet [id number]  TAKE ONE TABLET BY MOUTH TWICE A DAY [name], MD  Active   montelukast (SINGULAIR) 10 MG tablet [id number]  Take 1 tablet (10 mg total) by mouth at bedtime. Jiles Prows, MD  Active   NEURONTIN 100 MG capsule [id number]  Take 2 capsules (200 mg total) by mouth 2 (two) times daily. Kozlow, Donnamarie Poag, MD  Active   nitroGLYCERIN (NITROSTAT) 0.4 MG SL tablet [id number]  Place 1 tablet (0.4 mg total) under the tongue every 5 (five) minutes as needed for chest pain. Isaiah Serge, NP  Active   nortriptyline (PAMELOR) 10 MG capsule [id number]  Take 2 capsules (20 mg total) by mouth at bedtime. Suzzanne Cloud, NP  Active   pantoprazole (PROTONIX) 40 MG tablet [id number]  TAKE 1 TABLET (40 MG TOTAL) BY MOUTH TWO (TWO) TIMES DAILY. [name], MD  Active   potassium chloride SA (KLOR-CON) 20 MEQ tablet [id number]  Take 20 mEq by mouth daily. [provider]  Active   PROAIR HFA 108 [id number] Base) MCG/ACT inhaler [id number]  INHALE 1-2 PUFFS BY MOUTH INTO THE LUNGS EVERY 4 HOURS AS NEEDED FOR WHEEZING OR SHORTNESS OF BREATH Kozlow, Donnamarie Poag, MD  Active    promethazine (PHENERGAN) 25 MG tablet [id number]  Take 1 tablet (25 mg total) by mouth every 6 (six) hours as needed for nausea or vomiting. Kalman Shan Ratliff, DO  Active   sodium chloride (OCEAN) 0.65 % SOLN nasal spray [id number]  PLACE 1 SPRAY INTO BOTH NOSTRILS AS NEEDED FOR CONGESTION. Kalman Shan [place], DO  Active   TRELEGY ELLIPTA 200-62.5-25 MCG/INH AEPB [id number]  Inhale 1 puff into the lungs daily. Kozlow, Donnamarie Poag, MD  Active   triamcinolone (NASACORT) 55 MCG/ACT AERO nasal inhaler [id number]  1 spray each nostril 2 times per day Jiles Prows, MD  Active             Patient Active Problem List   Diagnosis Date Noted   Left leg swelling 07/09/2020   Grief reaction 06/08/2020   Arm pain 02/10/2020   Dizziness on standing 12/27/2019   Generalized anxiety disorder with panic attacks 06/20/2019   Osteoarthritis of thumb, right 11/29/2018   Chronic migraine w/o aura w/o status migrainosus, not intractable 08/27/2018   Disorder of  left eustachian tube 05/19/2017   Vertigo of central origin 01/23/2017   Central perforation of tympanic membrane of left ear 12/09/2016   Eustachian tube dysfunction, bilateral 06-19-2016   Ear pain, right Jun 19, 2016   Spondylosis of lumbar region without myelopathy or radiculopathy 10/08/2015   BPPV (benign paroxysmal positional vertigo) 10/02/2015   Liver fibrosis ([place]) 12/04/2014   Pituitary microadenoma ([place]) 08/08/2014   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   Vitamin D deficiency 06/29/2014   Secondary adrenal insufficiency ([place]) 06/29/2014   History of tympanostomy tube placement 02/07/2014   Refusal of blood transfusions as patient is Jehovah's Witness 11/18/2013   Hepatitis C virus infection cured after antiviral drug therapy 03/16/2012   Health care maintenance 12/15/2011   Opioid dependence ([place]) 10/05/2010   HTN (hypertension) 10/05/2010   Hyperlipidemia associated with type 2 diabetes mellitus ([place])  10/23/2008   HIV disease ([place]) 05/09/2006   Depression 05/09/2006   Allergic rhinitis 05/09/2006   GERD 05/09/2006    Conditions to be addressed/monitored per PCP order:   chronic healthcare management needs.  Care Plan : General Plan of Care (Adult)  Updates made by Gayla Medicus, RN since 01/29/2021 12:00 AM     Problem: Health Promotion or Disease Self-Management (General Plan of Care)   Priority: Medium  Onset Date: 08/10/2020     Long-Range Goal: Self-Management Plan Developed   Start Date: 05/07/2020  Expected End Date: 05/01/2021  Recent Progress: On track  Priority: Medium  Note:   Current Barriers:  Chronic Disease Management support and education needs.  Patient with increased anxiety and wants to increase Wellbutrin dose.  Patient with an increase in left leg swelling-saw PCP-wearing compression stockings and elevating which helps. Patient helping to provide care to brother. This causes patient a lot of stress and anxiety.  She has recently started taking Wellbutrin which she states is helping.  Patient declines any therapy services at this time. Update 04-Jul-2020: Patient's brother died 2020/06/19.  Patient is considering grief counseling with Hospice, declines SW referral at this time. Update 08/10/20:  Patient states she is doing better-doesn't feel like she needs anything right now. Update 09/09/20:  Patient without complaint today.  Breathing and itching improved with medication adjustment. Update 10/06/20:  Patient doing well, no complaints today, attending grief counseling as needed with Hospice. Update 11/05/20:  Patient speaking with grief counselor two times a month. Update 12/02/20:  Patient has increased Wellbutrin after speaking with her PCP to 300 mg and this is helping.  Left foot swelling, no change, elevates and wears compression stockings as needed. Update 12/30/20:  Patient without complaint today-left foot swelling less. Update 01/29/21:  Patient c/o left arm pain and  dizziness today-states she will contact providers to schedule appointment.  Transportation issues, insurance transportation information provided-has [employer] transportation information already.  Nurse Case Manager Clinical Goal(s):  Over the next 30 days, patient will attend all scheduled medical appointments-patient will schedule appointments with providers to evaluate left arm pain and dizziness. Over the next 30 days, patient will work with CM team pharmacist to review medications. Update 06/02/20:  Patient met with Pharmacist 05/11/20 and has follow up scheduled. Update 04-Jul-2020:  Patient continues to work with Pharmacist.  Interventions:  Inter-disciplinary care team collaboration (see longitudinal plan of care) Evaluation of current treatment plan and patient's adherence to plan as established by provider. Reviewed medications with patient. Collaborated with pharmacy regarding medications. Discussed plans with patient for ongoing care management follow up and provided patient with direct  contact information for care management team Reviewed scheduled/upcoming provider appointments. Update 06/30/20:  Patient has appointment with PCP 07/07/20. Update 09/09/20:  Patient attending all scheduled appointments. Update 11/05/20:  Patient will contact PCP regarding increasing Wellbutrin dose-completed. Update 01/29/21:  Patient will schedule appointments with providers for evaluation of left arm pain and dizziness. Pharmacy referral for medication review-completed.  Patient Goals/Self-Care Activities Over the next 30 days, patient will:  -Attends all scheduled provider appointments Calls pharmacy for medication refills Calls provider office for new concerns or questions  Follow Up Plan: The Managed Medicaid care management team will reach out to the patient again over the next 30 days.  The patient has been provided with contact information for the Managed Medicaid care management team and has been  advised to call with any health related questions or concerns.        Follow Up:  Patient agrees to Care Plan and Follow-up.  Plan: The Managed Medicaid care management team will reach out to the patient again over the next 30 days. and The  Patient has been provided with contact information for the Managed Medicaid care management team and has been advised to call with any health related questions or concerns.  Date/time of next scheduled RN care management/care coordination outreach: 03/01/21 at 0900.

## 2021-02-02 ENCOUNTER — Other Ambulatory Visit (HOSPITAL_COMMUNITY)
Admission: RE | Admit: 2021-02-02 | Discharge: 2021-02-02 | Disposition: A | Discharge status: Home / Self Care | Payer: Medicaid Other | Source: Ambulatory Visit | Attending: Infectious Diseases | Admitting: Infectious Diseases

## 2021-02-02 ENCOUNTER — Other Ambulatory Visit: Payer: Medicaid Other

## 2021-02-02 ENCOUNTER — Other Ambulatory Visit: Payer: Self-pay

## 2021-02-02 DIAGNOSIS — B20 Human immunodeficiency virus [HIV] disease: Secondary | ICD-10-CM | POA: Insufficient documentation

## 2021-02-02 DIAGNOSIS — Z79899 Other long term (current) drug therapy: Secondary | ICD-10-CM | POA: Diagnosis not present

## 2021-02-02 DIAGNOSIS — Z113 Encounter for screening for infections with a predominantly sexual mode of transmission: Secondary | ICD-10-CM | POA: Insufficient documentation

## 2021-02-03 LAB — URINE CYTOLOGY ANCILLARY ONLY
Chlamydia: NEGATIVE
Comment: NEGATIVE
Comment: NORMAL
Neisseria Gonorrhea: NEGATIVE

## 2021-02-03 LAB — T-HELPER CELL (CD4) - (RCID CLINIC ONLY)
CD4 % Helper T Cell: 30 % — ABNORMAL LOW (ref 33–65)
CD4 T Cell Abs: 608 /uL (ref 400–1790)

## 2021-02-04 LAB — CBC WITH DIFFERENTIAL/PLATELET
Absolute Monocytes: 479 cells/uL (ref 200–950)
Basophils Absolute: 23 cells/uL (ref 0–200)
Basophils Relative: 0.4 %
Eosinophils Absolute: 182 cells/uL (ref 15–500)
Eosinophils Relative: 3.2 %
HCT: 42.9 % (ref 35.0–45.0)
Hemoglobin: 14.1 g/dL (ref 11.7–15.5)
Lymphs Abs: 2177 cells/uL (ref 850–3900)
MCH: 27.5 pg (ref 27.0–33.0)
MCHC: 32.9 g/dL (ref 32.0–36.0)
MCV: 83.8 fL (ref 80.0–100.0)
MPV: 10.6 fL (ref 7.5–12.5)
Monocytes Relative: 8.4 %
Neutro Abs: 2839 cells/uL (ref 1500–7800)
Neutrophils Relative %: 49.8 %
Platelets: 262 10*3/uL (ref 140–400)
RBC: 5.12 10*6/uL — ABNORMAL HIGH (ref 3.80–5.10)
RDW: 14.5 % (ref 11.0–15.0)
Total Lymphocyte: 38.2 %
WBC: 5.7 10*3/uL (ref 3.8–10.8)

## 2021-02-04 LAB — COMPLETE METABOLIC PANEL WITH GFR
AG Ratio: 1.6 (calc) (ref 1.0–2.5)
ALT: 28 U/L (ref 6–29)
AST: 22 U/L (ref 10–35)
Albumin: 4.4 g/dL (ref 3.6–5.1)
Alkaline phosphatase (APISO): 70 U/L (ref 37–153)
BUN: 14 mg/dL (ref 7–25)
CO2: 27 mmol/L (ref 20–32)
Calcium: 8.7 mg/dL (ref 8.6–10.4)
Chloride: 104 mmol/L (ref 98–110)
Creat: 0.89 mg/dL (ref 0.50–1.03)
Globulin: 2.8 g/dL (calc) (ref 1.9–3.7)
Glucose, Bld: 164 mg/dL — ABNORMAL HIGH (ref 65–99)
Potassium: 3.7 mmol/L (ref 3.5–5.3)
Sodium: 138 mmol/L (ref 135–146)
Total Bilirubin: 0.4 mg/dL (ref 0.2–1.2)
Total Protein: 7.2 g/dL (ref 6.1–8.1)
eGFR: 75 mL/min/{1.73_m2} (ref >=60)

## 2021-02-04 LAB — LIPID PANEL
Cholesterol: 169 mg/dL (ref <200)
HDL: 45 mg/dL — ABNORMAL LOW (ref >=50)
LDL Cholesterol (Calc): 91 mg/dL (calc)
Non-HDL Cholesterol (Calc): 124 mg/dL (calc) (ref <130)
Total CHOL/HDL Ratio: 3.8 (calc) (ref <5.0)
Triglycerides: 238 mg/dL — ABNORMAL HIGH (ref <150)

## 2021-02-04 LAB — HIV-1 RNA QUANT-NO REFLEX-BLD
HIV 1 RNA Quant: 101 Copies/mL — ABNORMAL HIGH
HIV-1 RNA Quant, Log: 2 Log cps/mL — ABNORMAL HIGH

## 2021-02-04 LAB — RPR: RPR Ser Ql: NONREACTIVE

## 2021-02-11 ENCOUNTER — Ambulatory Visit: Payer: Medicaid Other | Admitting: Neurology

## 2021-02-11 ENCOUNTER — Telehealth: Payer: Medicaid Other | Admitting: Neurology

## 2021-02-15 ENCOUNTER — Other Ambulatory Visit: Payer: Self-pay | Admitting: Neurology

## 2021-02-15 ENCOUNTER — Telehealth (INDEPENDENT_AMBULATORY_CARE_PROVIDER_SITE_OTHER): Payer: Medicaid Other | Admitting: Neurology

## 2021-02-15 ENCOUNTER — Other Ambulatory Visit: Payer: Self-pay | Admitting: Allergy and Immunology

## 2021-02-15 ENCOUNTER — Other Ambulatory Visit: Payer: Self-pay | Admitting: Student

## 2021-02-15 DIAGNOSIS — G43709 Chronic migraine without aura, not intractable, without status migrainosus: Secondary | ICD-10-CM | POA: Diagnosis not present

## 2021-02-15 DIAGNOSIS — K219 Gastro-esophageal reflux disease without esophagitis: Secondary | ICD-10-CM

## 2021-02-15 DIAGNOSIS — D352 Benign neoplasm of pituitary gland: Secondary | ICD-10-CM

## 2021-02-15 MED ORDER — NORTRIPTYLINE HCL 10 MG PO CAPS: 20.0000 mg | ORAL_CAPSULE | Freq: Every day | ORAL | 4 refills | Status: DC

## 2021-02-15 MED ORDER — SUMATRIPTAN SUCCINATE 25 MG PO TABS: 25.0000 mg | ORAL_TABLET | ORAL | 6 refills | Status: AC | PRN

## 2021-02-15 NOTE — Progress Notes (Signed)
ASSESSMENT AND PLAN 60 y.o. year old female :  Chronic daily headaches Pituitary microadenoma -Headaches well controlled -Could not tolerate Topamax due to worsening glaucoma -Continue nortriptyline 20 mg at bedtime -Continue Imitrex 25 mg as needed for acute headache -MRI of the brain in March 2020 showed overall stable pituitary microadenoma, no change from 2018, Continue follow-up with primary care physician only return to clinic for new issues  HISTORY OF PRESENT ILLNESS:  [name] [name] is a 60 year old female, seen in request by her primary care physician Dr. Heber Hooppole, Elza Rafter, for evaluation of migraine headache, initial evaluation was on June 25, 2018.   I have reviewed and summarized the referring note from the referring physician.  She had a past medical history of hyperlipidemia, allergy.  She has a history of pituitary adenoma, under close supervision of endocrinologist Dr. 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  She also have a history of HIV, hepatitis C that was treated, Addison's disease, on high dose of hydrocortisone treatment, steroid induced diabetes,   She reported history of migraine headache when she was young, her typical migraine are bilateral temporal, retro-orbital area severe pressure headache with light noise sensitivity, nauseous, prefer to lie down in dark quiet room.   In October 2019, she began to have different kind of headaches, this started with dizziness, few things were spinning around, worsening with sudden positional movement, no hearing loss, no tinnitus, later she developed this constant bilateral temporal frontal area pressure headaches, difficulty focusing, once a week, it would exacerbated to a more severe  headache with light noise sensitivity, nauseous, eating better lying down sleep.   Update Aug 27, 2018 SS: CMP, sed rate in March 2020 was unremarkable, MRI of the brain in March 2020, no change from 2018   She says that her last visit with Dr. Krista Blue, she was started on Topamax 50 mg twice a day.  She reports the Topamax has been very beneficial for her headaches.  Since starting the Topamax, she is having about 2 headaches a week, but are not bad enough to take any medication for relief.  She has not taken the Imitrex.  She reports a cough, allergies.  She reports if she did not have allergies or cough, she thinks she would not have a headache.  She also takes Valium for chronic insomnia.  She reports overall she is doing much better with the addition of Topamax.  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  She denies any new problems or concerns.  09/24/2018 Dr. Krista Blue: She started Topamax 50mg  bid few months ago, which has helped her migraine, but it has affected her vision, she saw flash light in her eye, also complains of difficulty focusing, difficulty reading.     I was able to review her ophthalmology evaluation by [facility] care dated Sep 14, 2018,   Anatomic narrow angle OU, glaucoma suspected, pituitary adenoma, dry eyes syndrome bilaterally, nuclear sclerosis OU, color age-related cataract OU, severe retinopathy OU, retinoschsis OU, vitreous obesity, macular Drusen OU, dermatochalasis,   Dr. Kathlen Mody think that she has side effect from topamax.  IOP 13/17, no glaucoma change OU   Topamax has helped her migraine, Imitrex 25 mg as needed was helpful  to  UPDATE Feb 15 2021:  Virtual Visit via video Location: Provider: [facility] office; Patient: Home I connected with [name] [name]  on February 15, 2021 by a video enabled telemedicine application and verified that I am speaking with the correct person using two identifiers.  UPDATE  She lost her brother in February 2022, went  through a lot of stress, complains of increased migraine headache then, now has improved, she continued on nortriptyline 10 mg 2 tablets every night as preventive medication, works well, also help relaxing, put her into sleep, she was put on Wellbutrin by primary care for depression, which has helped, Imitrex 25 mg as needed was helpful, she only use it once every 1 to 2 months   Observations/Objective: I have reviewed problem lists, medications, allergies. Awake, alert, oriented to history taking and casual conversation, facial symmetric, no dysarthria, no aphasia, moving 4 extremities without difficulties, gait was normal  REVIEW OF SYSTEMS: Out of a complete 14 system review of symptoms, the patient complains only of the following symptoms, and all other reviewed systems are negative.  Headache  ALLERGIES: Allergies  Allergen Reactions   Acetaminophen Other (See Comments)    Inflamed liver, hospitalized REACTION: Had liver problems, was hospitalized Inflamed liver, hospitalized Other reaction(s): liver demage Other reaction(s): liver demage Other reaction(s): liver demage Other reaction(s): liver demage Other reaction(s): liver demage Other reaction(s): liver demage Other reaction(s): liver demage   Morphine Hives and Rash    REACTION: rash   Triamterene Hives   Aspirin-Caffeine     Other reaction(s): liver demage Other reaction(s): liver demage Other reaction(s): liver demage   Citalopram     Other reaction(s): INCREASED ANXIETY Other reaction(s): INCREASED ANXIETY   Citalopram Hydrobromide     Other reaction(s): INCREASED ANXIETY   Dyazide [Hydrochlorothiazide W-Triamterene] Hives   Empagliflozin     Other reaction(s): stomach ache, diarrhea Other reaction(s): stomach ache, diarrhea Other reaction(s): stomach ache, diarrhea   Emtricitabine-Tenofovir Df     Other reaction(s): rash Other reaction(s): rash Other reaction(s): rash   Lisinopril     Cough    Metformin  Hcl Er     Other reaction(s): upset stomach Other reaction(s): upset stomach   Morphine Sulfate     Other reaction(s): shortness of breath and hives Other reaction(s): shortness of breath and hives Other reaction(s): shortness of breath and hives   Topamax [Topiramate]    Triamterene-Hctz     Other reaction(s): rash Other reaction(s): rash Other reaction(s): rash   Losartan Rash    Pt had rash, worsening dizziness, and nausea after starting losartan, which improved after stopping losartan    HOME MEDICATIONS: Outpatient Medications Prior to Visit  Medication Sig Dispense Refill   amLODipine (NORVASC) 10 MG tablet TAKE 1 TABLET (10 MG TOTAL) BY MOUTH DAILY. 90 tablet 4   atorvastatin (LIPITOR) 10 MG tablet TAKE HALF TABLET BY MOUTH DAILY 90 tablet 10   BIKTARVY 50-200-25 MG TABS tablet TAKE 1 TABLET BY MOUTH DAILY. 30 tablet 2   Budeson-Glycopyrrol-Formoterol (BREZTRI AEROSPHERE) 160-9-4.8 MCG/ACT AERO Inhale 2 puffs into the lungs in the morning and at bedtime. On empty lungs (Patient not taking: Reported on 01/29/2021) 10.7 g 5   buPROPion (WELLBUTRIN XL) 300 MG 24 hr tablet Take 1 tablet (300 mg total) by mouth daily. 90 tablet 1   cetirizine (ZYRTEC) 10 MG tablet 1 tablet daily 90 tablet 6   cholecalciferol (VITAMIN D) 25 MCG (1000 UNIT) tablet TAKE ONE TABLET BY MOUTH DAILY 90 tablet 1  Elastic Bandages & Supports (WRIST/THUMB SPLINT/RIGHT MED) MISC Wear on R thumb/wrist. 1 each 0   famotidine (PEPCID) 40 MG tablet TAKE ONE TABLET BY MOUTH AT BEDTIME 30 tablet 4   gabapentin (NEURONTIN) 100 MG capsule Take 1 capsule (100 mg total) by mouth 3 (three) times daily. 90 capsule 5   hydrocortisone (CORTEF) 10 MG tablet Take 10 mg by mouth as directed. Pt takes once a day in morning.     liraglutide (VICTOZA) 18 MG/3ML SOPN Inject into the skin.     metoprolol tartrate (LOPRESSOR) 25 MG tablet TAKE ONE TABLET BY MOUTH TWICE A DAY 180 tablet 1   montelukast (SINGULAIR) 10 MG tablet TAKE  ONE TABLET BY MOUTH AT BEDTIME 30 tablet 4   NEURONTIN 100 MG capsule Take 2 capsules (200 mg total) by mouth 2 (two) times daily. 120 capsule 5   nitroGLYCERIN (NITROSTAT) 0.4 MG SL tablet Place 1 tablet (0.4 mg total) under the tongue every 5 (five) minutes as needed for chest pain. 25 tablet 1   nortriptyline (PAMELOR) 10 MG capsule Take 2 capsules (20 mg total) by mouth at bedtime. 180 capsule 4   pantoprazole (PROTONIX) 40 MG tablet TAKE 1 TABLET (40 MG TOTAL) BY MOUTH TWO (TWO) TIMES DAILY. 60 tablet 1   potassium chloride SA (KLOR-CON) 20 MEQ tablet Take 20 mEq by mouth daily.     PROAIR HFA 108 (90 Base) MCG/ACT inhaler INHALE 1-2 PUFFS BY MOUTH INTO THE LUNGS EVERY 4 HOURS AS NEEDED FOR WHEEZING OR SHORTNESS OF BREATH 8.5 g 0   promethazine (PHENERGAN) 25 MG tablet Take 1 tablet (25 mg total) by mouth every 6 (six) hours as needed for nausea or vomiting. 30 tablet 3   sodium chloride (OCEAN) 0.65 % SOLN nasal spray PLACE 1 SPRAY INTO BOTH NOSTRILS AS NEEDED FOR CONGESTION. 3 Bottle 0   TRELEGY ELLIPTA 200-62.5-25 MCG/INH AEPB Inhale 1 puff into the lungs daily. 28 each 5   triamcinolone (NASACORT) 55 MCG/ACT AERO nasal inhaler 1 spray each nostril 2 times per day 16.5 g 5   No facility-administered medications prior to visit.    PAST MEDICAL HISTORY: Past Medical History:  Diagnosis Date   Allergic rhinitis 05/09/2006   Allergy    Anxiety    Arthritis    Asthma    CHF (congestive heart failure) (HCC)    Chronic back pain    Diabetes mellitus without complication (HCC)    GERD (gastroesophageal reflux disease)    Heart murmur    as a child   Hepatitis C    genotype 1b, 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 s/p 12 week course of simeprevir and sofosbuvir between October 2014 and January 2015 with resolution.   History of shingles    HIV infection ([place])    1994   Hyperlipidemia    no meds taken now   Hypertension    Migraine    Pituitary microadenoma ([place])  08/08/2014   Prediabetes    Refusal of blood transfusions as patient is Jehovah's Witness    Secondary adrenal insufficiency ([place]) 06/29/2014   Urticaria     PAST SURGICAL HISTORY: Past Surgical History:  Procedure Laterality Date   BUNIONECTOMY     b/l   COLECTOMY     2003 for diverticulitis, had colostomy bag and then reversed   COLONOSCOPY     HAND SURGERY     NASAL SINUS SURGERY     SHOULDER SURGERY     left  TONSILLECTOMY      FAMILY HISTORY: Family History  Problem Relation Age of Onset   Heart disease Mother    Diabetes Mother    Stroke Mother    Heart disease Father    Stroke Father    Diabetes Father    Hepatitis Sister        hcv   Asthma Sister    Allergic rhinitis Sister    Stroke Other    Colon polyps Brother    Renal Disease Brother    Cancer Sister        lung   Asthma Sister    Allergic rhinitis Sister    Cancer Maternal Aunt    Cancer Maternal Aunt    Colon cancer Neg Hx    Esophageal cancer Neg Hx    Stomach cancer Neg Hx    Rectal cancer Neg Hx    Angioedema Neg Hx    Eczema Neg Hx    Urticaria Neg Hx     SOCIAL HISTORY: Social History   Socioeconomic History   Marital status: Single    Spouse name: Not on file   Number of children: 0   Years of education: 12   Highest education level: High school graduate  Occupational History   Occupation: Disabled  Tobacco Use   Smoking status: Former    Years: 20.00    Types: Cigarettes    Quit date: 04/26/2007    Years since quitting: 13.8   Smokeless tobacco: Never   Tobacco comments:    QUIT 2009  Vaping Use   Vaping Use: Never used  Substance and Sexual Activity   Alcohol use: No    Alcohol/week: 0.0 standard drinks   Drug use: No   Sexual activity: Not Currently    Partners: Male    Birth control/protection: Condom    Comment: pt. given condoms  Other Topics Concern   Not on file  Social History Narrative   Lives at home alone.   Right-handed.   Occasional tea or soda.    Te   Social Determinants of Radio broadcast assistant Strain: Low Risk    Difficulty of Paying Living Expenses: Not hard at all  Food Insecurity: No Food Insecurity   Worried About Charity fundraiser in the Last Year: Never true   Ran Out of Food in the Last Year: Never true  Transportation Needs: No Transportation Needs   Lack of Transportation (Medical): No   Lack of Transportation (Non-Medical): No  Physical Activity: Insufficiently Active   Days of Exercise per Week: 7 days   Minutes of Exercise per Session: 10 min  Stress: No Stress Concern Present   Feeling of Stress : Only a little  Social Connections: Moderately Integrated   Frequency of Communication with Friends and Family: More than three times a week   Frequency of Social Gatherings with Friends and Family: More than three times a week   Attends Religious Services: More than 4 times per year   Active Member of [employer] or Organizations: Yes   Attends Archivist Meetings: More than 4 times per year   Marital Status: Widowed  Human resources officer Violence: Not At Risk   Fear of Current or Ex-Partner: No   Emotionally Abused: No   Physically Abused: No   Sexually Abused: No    DIAGNOSTIC DATA (LABS, IMAGING, TESTING) - I reviewed patient records, labs, notes, testing and imaging myself where available.  Lab Results  Component Value Date  WBC 5.7 02/02/2021   HGB 14.1 02/02/2021   HCT 42.9 02/02/2021   MCV 83.8 02/02/2021   PLT 262 02/02/2021      Component Value Date/Time   NA 138 02/02/2021 0940   NA 140 01/15/2015 1341   K 3.7 02/02/2021 0940   CL 104 02/02/2021 0940   CO2 27 02/02/2021 0940   GLUCOSE 164 (H) 02/02/2021 0940   BUN 14 02/02/2021 0940   BUN 11 01/15/2015 1341   CREATININE 0.89 02/02/2021 0940   CALCIUM 8.7 02/02/2021 0940   PROT 7.2 02/02/2021 0940   ALBUMIN 4.1 07/12/2016 1137   AST 22 02/02/2021 0940   ALT 28 02/02/2021 0940   ALKPHOS 87 07/12/2016 1137   BILITOT 0.4  02/02/2021 0940   GFRNONAA 89 11/24/2015 1510   GFRAA >89 11/24/2015 1510   Lab Results  Component Value Date   CHOL 169 02/02/2021   HDL 45 (L) 02/02/2021   LDLCALC 91 02/02/2021   TRIG 238 (H) 02/02/2021   CHOLHDL 3.8 02/02/2021   Lab Results  Component Value Date   HGBA1C 6.5 10/30/2020   No results found for: [id number] Lab Results  Component Value Date   TSH 0.695 06/26/2014    Marcial Pacas, M.D. Ph.D.  [facility] [place], [place] [address] Phone: [phone] Fax:      [phone]

## 2021-02-16 ENCOUNTER — Encounter: Payer: Self-pay | Admitting: Infectious Diseases

## 2021-02-16 ENCOUNTER — Ambulatory Visit (INDEPENDENT_AMBULATORY_CARE_PROVIDER_SITE_OTHER): Payer: Medicaid Other | Admitting: Infectious Diseases

## 2021-02-16 ENCOUNTER — Other Ambulatory Visit: Payer: Self-pay

## 2021-02-16 VITALS — BP 154/93 | HR 87 | Temp 97.9°F | Wt 181.0 lb

## 2021-02-16 DIAGNOSIS — F4321 Adjustment disorder with depressed mood: Secondary | ICD-10-CM | POA: Diagnosis not present

## 2021-02-16 DIAGNOSIS — Z23 Encounter for immunization: Secondary | ICD-10-CM

## 2021-02-16 DIAGNOSIS — B20 Human immunodeficiency virus [HIV] disease: Secondary | ICD-10-CM | POA: Diagnosis not present

## 2021-02-16 MED ORDER — BIKTARVY 50-200-25 MG PO TABS: 1.0000 | ORAL_TABLET | Freq: Every day | ORAL | 11 refills | Status: DC

## 2021-02-16 NOTE — Assessment & Plan Note (Signed)
Well controlled on once daily Biktarvy. Viral blip discussed with most recent VL 100 copies.  Will have her repeat VL in 22m to follow another trend point. She continues to take her biktarvy without any missed doses. No drug interactions noted with ARVs that would affect absorption.   After lab check in 57m, she can come for in person follow up in 31m.  Flu shot today.  Sexual health discussed - not active presently but if so recommend condoms with partner sex.

## 2021-02-16 NOTE — Assessment & Plan Note (Signed)
Appreciate ongoing help from grief counseling team for [facility].

## 2021-02-16 NOTE — Progress Notes (Signed)
Subjective:    [name], female    DOB: [birth year], 60 y.o.   MRN: [id number]   Chief Complaint  Patient presents with   Follow-up    B20     Brief Narrative:  [name] [name] is a 60 y.o. female with well controlled HIV (VL 36, CD4 800) on Biktarvy. She does not miss any doses of her HAART. She is a type 2 diabetic, on chronic prednisone for Addison's disease a h/o pituitary adenoma.   Previous regimens for HIV treatment include Lexiva/Norvir/Truvada, Isentress/Truvada (rashes), Prezcobix/Truvada (rash). History of treated Hepatitis C (completed 04/2013 with [facility]).     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 She is working with grief counseling team  She continues to maintain steroids for Addison's disease - dropped back the dose with Dr. Buddy Duty to see if this helps. She does have some noticeable decrease in energy. Takes 5 mg every morning and 2.5 mg in the afternoon. She feels better with doing this. She wants to lose some weight ultimately.    Review of Systems  Constitutional:  Positive for fatigue. Negative for appetite change, chills, fever and unexpected weight change.  HENT:  Negative for mouth sores, sore throat and trouble swallowing.   Eyes:  Negative for pain and visual disturbance.  Respiratory:  Negative for cough and shortness of breath.   Cardiovascular: Negative.  Negative for chest pain.  Gastrointestinal:  Negative for abdominal pain, diarrhea, nausea and vomiting.  Genitourinary:  Negative for dysuria, menstrual problem and pelvic pain.  Musculoskeletal:  Negative for back pain, myalgias and neck pain.  Skin:  Negative for color change and rash.  Neurological:  Negative for weakness, numbness and headaches.  Hematological:  Negative for adenopathy.  Psychiatric/Behavioral:  Positive for dysphoric mood and sleep disturbance. The patient is nervous/anxious.        Objective:   Physical Exam Pulmonary:     Effort: Pulmonary effort is normal.     Comments: No shortness of breath detected in conversation.  Neurological:     Mental Status: She is oriented to person, place, and time.  Psychiatric:        Thought Content: Thought content normal.        Judgment: Judgment normal.     Comments: She is crying and appropriately sad given the context of the situation      Assessment & Plan:   Problem List Items Addressed This Visit       Unprioritized   HIV disease ([name] [name]) (Chronic)    Well controlled on once daily Biktarvy. Viral blip discussed with most recent VL 100 copies.  Will have her repeat VL in 40m to follow another trend point. She continues to take her biktarvy without any missed doses. No drug interactions noted with ARVs that would affect absorption.   After lab check in 35m, she can come for in person follow up in 39m.  Flu shot today.  Sexual health discussed - not active presently but if so recommend condoms with partner sex.       Relevant Medications   bictegravir-emtricitabine-tenofovir AF (BIKTARVY) 50-200-25 MG TABS tablet   Grief reaction    Appreciate ongoing help from grief counseling team for [facility].       Other Visit Diagnoses     Need for immunization against influenza    -  Primary   Relevant Orders  Flu Vaccine QUAD 75mo+IM (Fluarix, Fluzone & Alfiuria Quad PF) (Completed)   Human immunodeficiency virus (HIV) disease ([place])       Relevant Medications   bictegravir-emtricitabine-tenofovir AF (BIKTARVY) 50-200-25 MG TABS tablet   Other Relevant Orders   HIV-1 RNA quant-no reflex-bld       [name], MSN, NP-C [place] [email or web address] Pager: [phone] Office: [phone] [place]: [phone]

## 2021-02-16 NOTE — Patient Instructions (Signed)
Please continue your biktarvy everyday.   We gave you your flu shot today.   Please schedule a lab only visit in 3 months so we can get another check on your viral load.   Will plan to see you back in 6 months in person.

## 2021-02-18 DIAGNOSIS — H1045 Other chronic allergic conjunctivitis: Secondary | ICD-10-CM | POA: Diagnosis not present

## 2021-02-18 DIAGNOSIS — H40013 Open angle with borderline findings, low risk, bilateral: Secondary | ICD-10-CM | POA: Diagnosis not present

## 2021-02-18 DIAGNOSIS — H16223 Keratoconjunctivitis sicca, not specified as Sjogren's, bilateral: Secondary | ICD-10-CM | POA: Diagnosis not present

## 2021-02-18 DIAGNOSIS — H2513 Age-related nuclear cataract, bilateral: Secondary | ICD-10-CM | POA: Diagnosis not present

## 2021-02-18 DIAGNOSIS — E119 Type 2 diabetes mellitus without complications: Secondary | ICD-10-CM | POA: Diagnosis not present

## 2021-02-18 LAB — HM DIABETES EYE EXAM

## 2021-02-24 ENCOUNTER — Other Ambulatory Visit (HOSPITAL_COMMUNITY): Payer: Self-pay

## 2021-02-24 ENCOUNTER — Other Ambulatory Visit: Payer: Self-pay | Admitting: Allergy and Immunology

## 2021-02-24 ENCOUNTER — Telehealth: Payer: Self-pay

## 2021-02-24 DIAGNOSIS — Z23 Encounter for immunization: Secondary | ICD-10-CM

## 2021-02-24 MED ORDER — ZOSTER VAC RECOMB ADJUVANTED 50 MCG/0.5ML IM SUSR
0.5000 mL | INTRAMUSCULAR | 1 refills | Status: DC
  Filled 2021-02-24 (×2): qty 1, 1d supply, fill #0

## 2021-02-24 MED ORDER — SHINGRIX 50 MCG/0.5ML IM SUSR
0.5000 mL | INTRAMUSCULAR | 1 refills | Status: DC
  Filled 2021-02-24: qty 0.5, 1d supply, fill #0
  Filled 2021-05-10 – 2021-05-27 (×3): qty 0.5, 1d supply, fill #1
  Filled ????-??-?? (×2): fill #1

## 2021-02-24 NOTE — Telephone Encounter (Signed)
I put in a order (I hope successfully) for [facility] so that is there. Alternatively can print out a script and have her pick it up.   Thank you Butch Penny!

## 2021-02-24 NOTE — Telephone Encounter (Signed)
Patient made aware.

## 2021-02-24 NOTE — Telephone Encounter (Signed)
Patient left voicemail asking about Shingles vaccine. Says she called [facility], but was told her insurance won't cover it.   Beryle Flock, RN

## 2021-02-24 NOTE — Telephone Encounter (Signed)
[name] [name] ,  So I did some research and she have medicaid management and it is covered by her insurance $0.00. She can go to [facility] with her script and they can give her the injection today.

## 2021-02-24 NOTE — Addendum Note (Signed)
Addended by: Stanton Callas on: 02/24/2021 01:34 PM   Modules accepted: Orders

## 2021-02-24 NOTE — Telephone Encounter (Signed)
Patient called and needs to have her albuterol inhaler called into [facility] [phone].

## 2021-02-25 ENCOUNTER — Other Ambulatory Visit: Payer: Self-pay | Admitting: Allergy and Immunology

## 2021-02-26 ENCOUNTER — Encounter: Payer: Medicaid Other | Attending: Physical Medicine & Rehabilitation | Admitting: Physical Medicine & Rehabilitation

## 2021-02-26 ENCOUNTER — Other Ambulatory Visit: Payer: Self-pay

## 2021-02-26 ENCOUNTER — Encounter: Payer: Self-pay | Admitting: Physical Medicine & Rehabilitation

## 2021-02-26 VITALS — BP 144/87 | HR 84 | Ht 61.0 in | Wt 181.4 lb

## 2021-02-26 DIAGNOSIS — M533 Sacrococcygeal disorders, not elsewhere classified: Secondary | ICD-10-CM | POA: Insufficient documentation

## 2021-02-26 NOTE — Progress Notes (Signed)
Subjective:    [name], female    DOB: [birth year], 60 y.o.   MRN: [id number]  HPI 60 year old female with history of chronic bilateral low back pain without significant radiation to the lower extremities.  She also has a history of HIV which has been well controlled with low viral load and CD4 counts greater than 400 Bilateral Sacroiliac injections 01/05/2021, helped relieve Low back pain.   Tried Sacroiliac nerve blocks but only relieved 12% pain Pain Inventory Average Pain 10 Pain Right Now 7 My pain is sharp, dull, and aching  In the last 24 hours, has pain interfered with the following? General activity 8 Relation with others 2 Enjoyment of life 3 What TIME of day is your pain at its worst? morning , daytime, evening, and night Sleep (in general) Poor  Pain is worse with: walking, bending, inactivity, and some activites Pain improves with: injections Relief from Meds:  na  Family History  Problem Relation Age of Onset   Heart disease Mother    Diabetes Mother    Stroke Mother    Heart disease Father    Stroke Father    Diabetes Father    Hepatitis Sister        hcv   Asthma Sister    Allergic rhinitis Sister    Stroke Other    Colon polyps Brother    Renal Disease Brother    Cancer Sister        lung   Asthma Sister    Allergic rhinitis Sister    Cancer Maternal Aunt    Cancer Maternal Aunt    Colon cancer Neg Hx    Esophageal cancer Neg Hx    Stomach cancer Neg Hx    Rectal cancer Neg Hx    Angioedema Neg Hx    Eczema Neg Hx    Urticaria Neg Hx    Social History   Socioeconomic History   Marital status: Single    Spouse name: Not on file   Number of children: 0   Years of education: 12   Highest education level: High school graduate  Occupational History   Occupation: Disabled  Tobacco Use   Smoking status: Former    Years: 20.00    Types: Cigarettes    Quit date: 04/26/2007    Years since quitting: 13.8   Smokeless tobacco:  Never   Tobacco comments:    QUIT 2009  Vaping Use   Vaping Use: Never used  Substance and Sexual Activity   Alcohol use: No    Alcohol/week: 0.0 standard drinks   Drug use: No   Sexual activity: Not Currently    Partners: Male    Birth control/protection: Condom    Comment: declined condoms  Other Topics Concern   Not on file  Social History Narrative   Lives at home alone.   Right-handed.   Occasional tea or soda.   Te   Social Determinants of Radio broadcast assistant Strain: Low Risk    Difficulty of Paying Living Expenses: Not hard at all  Food Insecurity: No Food Insecurity   Worried About Charity fundraiser in the Last Year: Never true   Ran Out of Food in the Last Year: Never true  Transportation Needs: No Transportation Needs   Lack of Transportation (Medical): No   Lack of Transportation (Non-Medical): No  Physical Activity: Insufficiently Active   Days of Exercise per Week: 7 days   Minutes of Exercise  per Session: 10 min  Stress: No Stress Concern Present   Feeling of Stress : Only a little  Social Connections: Moderately Integrated   Frequency of Communication with Friends and Family: More than three times a week   Frequency of Social Gatherings with Friends and Family: More than three times a week   Attends Religious Services: More than 4 times per year   Active Member of [employer] or Organizations: Yes   Attends Archivist Meetings: More than 4 times per year   Marital Status: Widowed   Past Surgical History:  Procedure Laterality Date   BUNIONECTOMY     b/l   COLECTOMY     2003 for diverticulitis, had colostomy bag and then reversed   COLONOSCOPY     HAND SURGERY     NASAL SINUS SURGERY     SHOULDER SURGERY     left   TONSILLECTOMY     Past Surgical History:  Procedure Laterality Date   BUNIONECTOMY     b/l   COLECTOMY     2003 for diverticulitis, had colostomy bag and then reversed   COLONOSCOPY     HAND SURGERY     NASAL  SINUS SURGERY     SHOULDER SURGERY     left   TONSILLECTOMY     Past Medical History:  Diagnosis Date   Allergic rhinitis 05/09/2006   Allergy    Anxiety    Arthritis    Asthma    CHF (congestive heart failure) (HCC)    Chronic back pain    Diabetes mellitus without complication (HCC)    GERD (gastroesophageal reflux disease)    Heart murmur    as a child   Hepatitis C    genotype 1b, 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 s/p 12 week course of simeprevir and sofosbuvir between October 2014 and January 2015 with resolution.   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   Prediabetes    Refusal of blood transfusions as patient is Jehovah's Witness    Secondary adrenal insufficiency (HCC) 06/29/2014   Urticaria    BP (!) 144/87   Pulse 84   Ht 5\' 1"  (1.549 m)   Wt 181 lb 6.4 oz (82.3 kg)   SpO2 97%   BMI 34.28 kg/m   Opioid Risk Score:   Fall Risk Score:  `1  Depression screen PHQ 2/9  Depression screen [facility] 2/9 01/29/2021 10/29/2020 10/29/2020 10/02/2020 07/07/2020 02/07/2020 11/27/2019  Decreased Interest 1 0 0 0 0 0 0  Down, Depressed, Hopeless 1 0 0 0 0 0 0  PHQ - 2 Score 2 0 0 0 0 0 0  Altered sleeping - 1 - - 3 - -  Tired, decreased energy 1 1 - - 1 - -  Change in appetite 0 0 - - 0 - -  Feeling bad or failure about yourself  0 0 - - 0 - -  Trouble concentrating - 0 - - 0 - -  Moving slowly or fidgety/restless 0 0 - - 0 - -  Suicidal thoughts 0 0 - - 0 - -  PHQ-9 Score - 2 - - 4 - -  Difficult doing work/chores Not difficult at all Not difficult at all - - Not difficult at all - -  Some recent data  might be hidden     Review of Systems  Musculoskeletal:  Positive for back pain and neck pain.       Bilateral hamstring pain  All other systems reviewed and are negative.     Objective:   Physical Exam Musculoskeletal:     Comments:   Sacral thrust (prone) :+ Lateral compression:- FABER's: + Distraction (supine):+ Thigh thrust test: -   Motor strength is 5/5 bilateral hip flexor knee extensor ankle dorsiflexor Negative straight leg raising bilaterally Sensation intact bilateral lower extremities Normal deep tendon reflexes Ambulates without assistive device no evidence of toe drag or knee stability Lumbar range of motion has normal lumbar flexion extension is also normal       Assessment & Plan:  1.  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  She still exhibits signs of SI provocative tests 3/5 No signs of radiculopathy or facet syndrome. Repeat sacroiliac injection in 2 months

## 2021-03-01 ENCOUNTER — Other Ambulatory Visit: Payer: Self-pay

## 2021-03-01 ENCOUNTER — Other Ambulatory Visit: Payer: Self-pay | Admitting: Obstetrics and Gynecology

## 2021-03-01 NOTE — Patient Instructions (Signed)
Hi [name] [name], thank you for speaking with me this morning-enjoy your day!  [name] [name] was given information about Medicaid Managed Care team care coordination services as a part of their [place] Medicaid benefit. [name] [name] verbally consented to engagement with the [facility] Managed Care team.   If you are experiencing a medical emergency, please call 911 or report to your local emergency department or urgent care.   If you have a non-emergency medical problem during routine business hours, please contact your provider's office and ask to speak with a nurse.   For questions related to your [facility], please call: [phone] or visit the homepage here: [email or web address]  If you would like to schedule transportation through your [facility], please call the following number at least 2 days in advance of your appointment: [phone].   Call the [place] at [phone], at any time, 24 hours a day, 7 days a week. If you are in danger or need immediate medical attention call 911.  If you would like help to quit smoking, call 1-800-QUIT-NOW 901-161-3247) OR Espaol: 1-855-Djelo-Ya (4-132-440-1027) o para ms informacin haga clic aqu or Text READY to 200-400 to register via text  [name] [name] - following are the goals we discussed in your visit today:   Goals Addressed             This Visit's Progress    Protect My Health       Timeframe:  Long-Range Goal Priority:  Medium Start Date:      05/07/20                       Expected End Date: ongoing             Follow Up Date: 03/31/21   - schedule appointment for vaccines needed due to my age or health - schedule recommended health tests (blood work, mammogram, colonoscopy, pap test) - schedule and keep appointment for annual check-up    Why is this important?   Screening tests  can find diseases early when they are easier to treat.  Your doctor or nurse will talk with you about which tests are important for you.  Getting shots for common diseases like the flu and shingles will help prevent them.     Update 06/30/20:  Patient has appointment with PCP 07/07/20. Update 08/10/20:  Patient attending all appointments. Update 09/09/20:  Patient with no complaints, attending all appointments. Update 10/10/20:Patient doing well today, attending grief counseling as needed. Update 11/05/20:  Grief counseling twice a month, increased anxiety and wants to increase dose of Wellbutrin-patient will contact her PCP. Update 12/02/20:  Patient keeping all appointments-has increased Wellbutrin and this is helpful.  Has appointment with Dr. Buddy Duty this week. Update 12/30/20:  Patient without complaint today-recently had oral surgery-getting new partial. Update 01/29/21:  C/O left arm pain and dizziness today-will schedule appointments with provider. Update 03/31/21:  Patient states she is feeling better   The patient verbalized understanding of instructions provided today and declined a print copy of patient instruction materials.   The Managed Medicaid care management team will reach out to the patient again over the next 30 days.  The  Patient has been provided with contact information for the Managed Medicaid care management team and has been advised to call with any health related questions or concerns.   [name] Raider RN, BSN [employer]  Network Care Management Coordinator - Managed Medicaid High Risk [phone].   Following is a copy of your plan of care:  Care Plan : General Plan of Care (Adult)  Updates made by Gayla Medicus, RN since 03/01/2021 12:00 AM     Problem: Health Promotion or Disease Self-Management (General Plan of Care)   Priority: Medium  Onset Date: 08/10/2020     Long-Range Goal: Self-Management Plan Developed   Start Date: 05/07/2020  Expected End  Date: 05/01/2021  Recent Progress: On track  Priority: Medium  Note:   Current Barriers:  Chronic Disease Management support and education needs.  Patient with increased anxiety and wants to increase Wellbutrin dose.  Patient with an increase in left leg swelling-saw PCP-wearing compression stockings and elevating which helps. Patient helping to provide care to brother. This causes patient a lot of stress and anxiety.  She has recently started taking Wellbutrin which she states is helping.  Patient declines any therapy services at this time. Update 07-09-2020: Patient's brother died 06-24-20.  Patient is considering grief counseling with Hospice, declines SW referral at this time. Update 08/10/20:  Patient states she is doing better-doesn't feel like she needs anything right now. Update 09/09/20:  Patient without complaint today.  Breathing and itching improved with medication adjustment. Update 10/06/20:  Patient doing well, no complaints today, attending grief counseling as needed with Hospice. Update 11/05/20:  Patient speaking with grief counselor two times a month. Update 12/02/20:  Patient has increased Wellbutrin after speaking with her PCP to 300 mg and this is helping.  Left foot swelling, no change, elevates and wears compression stockings as needed. Update 12/30/20:  Patient without complaint today-left foot swelling less. Update 01/29/21:  Patient c/o left arm pain and dizziness today-states she will contact providers to schedule appointment.  Transportation issues, insurance transportation information provided-has [employer] transportation information already. Update 03/01/21:  Patient without complaint today-is feeling better.  Nurse Case Manager Clinical Goal(s):  Over the next 30 days, patient will attend all scheduled medical appointments-patient will schedule appointments with providers to evaluate left arm pain and dizziness. Over the next 30 days, patient will work with CM team pharmacist to  review medications. Update 06/02/20:  Patient met with Pharmacist 05/11/20 and has follow up scheduled. Update 07/09/20:  Patient continues to work with Pharmacist.  Interventions:  Inter-disciplinary care team collaboration (see longitudinal plan of care) Evaluation of current treatment plan and patient's adherence to plan as established by provider. Reviewed medications with patient. Collaborated with pharmacy regarding medications. Discussed plans with patient for ongoing care management follow up and provided patient with direct contact information for care management team Reviewed scheduled/upcoming provider appointments. Update 07/09/20:  Patient has appointment with PCP 07/07/20. Update 09/09/20:  Patient attending all scheduled appointments. Update 11/05/20:  Patient will contact PCP regarding increasing Wellbutrin dose-completed. Update 01/29/21:  Patient will schedule appointments with providers for evaluation of left arm pain and dizziness-completed. Pharmacy referral for medication review-completed.  Patient Goals/Self-Care Activities Over the next 30 days, patient will:  -Attends all scheduled provider appointments Calls pharmacy for medication refills Calls provider office for new concerns or questions  Follow Up Plan: The Managed Medicaid care management team will reach out to the patient again over the next 30 days.  The patient has been provided with contact information for the Managed Medicaid care management team and has been advised to call with any health related questions or concerns.

## 2021-03-01 NOTE — Patient Outreach (Signed)
Medicaid Managed Care   Nurse Care Manager Note  03/01/2021 Name:  SYAN CULLIMORE MRN:  [id number] DOB:  08-10-1960  [name] [name] is an 60 y.o. year old female who is a primary patient of [name], MD.  The [facility] Managed Care Coordination team was consulted for assistance with:    Chronic healthcare management needs.  Ms. Pence was given information about Medicaid Managed Care Coordination team services today. [name] [name] Patient agreed to services and verbal consent obtained.  Engaged with patient by telephone for follow up visit in response to provider referral for case management and/or care coordination services.   Assessments/Interventions:  Review of past medical history, allergies, medications, health status, including review of consultants reports, laboratory and other test data, was performed as part of comprehensive evaluation and provision of chronic care management services.  SDOH (Social Determinants of Health) assessments and interventions performed: SDOH Interventions    Flowsheet Row Most Recent Value  SDOH Interventions   Housing Interventions Intervention Not Indicated  Transportation Interventions [facility]       Care Plan  Allergies  Allergen Reactions   Acetaminophen Other (See Comments)    Inflamed liver, hospitalized REACTION: Had liver problems, was hospitalized Inflamed liver, hospitalized Other reaction(s): liver demage Other reaction(s): liver demage Other reaction(s): liver demage Other reaction(s): liver demage Other reaction(s): liver demage Other reaction(s): liver demage Other reaction(s): liver demage   Morphine Hives and Rash    REACTION: rash   Triamterene Hives   Aspirin-Caffeine     Other reaction(s): liver demage Other reaction(s): liver demage Other reaction(s): liver demage   Citalopram     Other reaction(s): INCREASED ANXIETY Other reaction(s): INCREASED ANXIETY   Citalopram Hydrobromide      Other reaction(s): INCREASED ANXIETY   Dyazide [Hydrochlorothiazide W-Triamterene] Hives   Empagliflozin     Other reaction(s): stomach ache, diarrhea Other reaction(s): stomach ache, diarrhea Other reaction(s): stomach ache, diarrhea   Emtricitabine-Tenofovir Df     Other reaction(s): rash Other reaction(s): rash Other reaction(s): rash   Lisinopril     Cough    Metformin Hcl Er     Other reaction(s): upset stomach Other reaction(s): upset stomach   Morphine Sulfate     Other reaction(s): shortness of breath and hives Other reaction(s): shortness of breath and hives Other reaction(s): shortness of breath and hives   Topamax [Topiramate]    Triamterene-Hctz     Other reaction(s): rash Other reaction(s): rash Other reaction(s): rash   Losartan Rash    Pt had rash, worsening dizziness, and nausea after starting losartan, which improved after stopping losartan    Medications Reviewed Today     Reviewed by Gayla Medicus, RN (Registered Nurse) on 03/01/21 at [phone]  Med List Status: <None>   Medication Order Taking? Sig Documenting Provider Last Dose Status Informant  amLODipine (NORVASC) 10 MG tablet [id number] No TAKE 1 TABLET (10 MG TOTAL) BY MOUTH DAILY. [name], MD Taking Active   atorvastatin (LIPITOR) 10 MG tablet [id number] No TAKE HALF TABLET BY MOUTH DAILY Katsadouros, Vasilios, MD Taking Active   bictegravir-emtricitabine-tenofovir AF (BIKTARVY) 50-200-25 MG TABS tablet [id number] No Take 1 tablet by mouth daily. [place] Callas, NP Taking Active   Budeson-Glycopyrrol-Formoterol (BREZTRI AEROSPHERE) 160-9-4.8 MCG/ACT AERO [id number] No Inhale 2 puffs into the lungs in the morning and at bedtime. On empty lungs Kozlow, Donnamarie Poag, MD Taking Active   buPROPion (WELLBUTRIN XL) 300 MG 24 hr tablet [id number] No Take 1 tablet (300 mg  total) by mouth daily. [name], MD Taking Active   cetirizine (ZYRTEC) 10 MG tablet [id number] No 1 tablet daily Kozlow, Donnamarie Poag, MD  Taking Active   cholecalciferol (VITAMIN D) 25 MCG (1000 UNIT) tablet [id number] No TAKE ONE TABLET BY MOUTH DAILY [name], MD Taking Active   Elastic Bandages & Supports (WRIST/THUMB SPLINT/RIGHT MED) MISC [id number] No Wear on R thumb/wrist. Ladona Horns, MD Taking Active   famotidine (PEPCID) 40 MG tablet [id number] No TAKE ONE TABLET BY MOUTH AT BEDTIME Kozlow, Donnamarie Poag, MD Taking Active   gabapentin (NEURONTIN) 100 MG capsule [id number] No Take 1 capsule (100 mg total) by mouth 3 (three) times daily. Kozlow, Donnamarie Poag, MD Taking Active   hydrocortisone (CORTEF) 10 MG tablet [id number] No Take 10 mg by mouth as directed. Pt takes once a day in morning. [provider] Taking Active Self           Med Note (Jb Dulworth, Lorel Monaco   Fri Jan 29, 2021  9:22 AM)    hydrocortisone (CORTEF) 5 MG tablet [id number] No Take 1 tablet by mouth every morning. [provider] Taking Active   liraglutide (VICTOZA) 18 MG/3ML SOPN [id number] No Inject into the skin. [provider] Taking Active   metoprolol tartrate (LOPRESSOR) 25 MG tablet [id number] No TAKE ONE TABLET BY MOUTH TWICE A Harlow Mares, MD Taking Active   montelukast (SINGULAIR) 10 MG tablet [id number] No TAKE ONE TABLET BY MOUTH AT BEDTIME Kozlow, Donnamarie Poag, MD Taking Active   NEURONTIN 100 MG capsule [id number] No Take 2 capsules (200 mg total) by mouth 2 (two) times daily. Kozlow, Donnamarie Poag, MD Taking Active   nitroGLYCERIN (NITROSTAT) 0.4 MG SL tablet [id number] No Place 1 tablet (0.4 mg total) under the tongue every 5 (five) minutes as needed for chest pain. Isaiah Serge, NP Taking Active   nortriptyline (PAMELOR) 10 MG capsule [id number] No Take 2 capsules (20 mg total) by mouth at bedtime. Marcial Pacas, MD Taking Active   pantoprazole (PROTONIX) 40 MG tablet [id number] No TAKE ONE TABLET BY MOUTH TWICE A Harlow Mares, MD Taking Active   potassium chloride SA (KLOR-CON) 20 MEQ tablet [id number] No Take 20 mEq by mouth  daily. [provider] Taking Active   promethazine (PHENERGAN) 25 MG tablet [id number] No Take 1 tablet (25 mg total) by mouth every 6 (six) hours as needed for nausea or vomiting. [name], DO Taking Active   sodium chloride (OCEAN) 0.65 % SOLN nasal spray [id number] No PLACE 1 SPRAY INTO BOTH NOSTRILS AS NEEDED FOR CONGESTION. [name], DO Taking Active   SUMAtriptan (IMITREX) 25 MG tablet [id number] No Take 1 tablet (25 mg total) by mouth every 2 (two) hours as needed for migraine. May repeat in 2 hours if headache persists or recurs. Marcial Pacas, MD Taking Active   TRELEGY ELLIPTA 200-62.5-25 MCG/INH AEPB [id number] No Inhale 1 puff into the lungs daily. Kozlow, Donnamarie Poag, MD Taking Active   triamcinolone (NASACORT) 55 MCG/ACT AERO nasal inhaler [id number] No 1 spray each nostril 2 times per day Jiles Prows, MD Taking Active   VENTOLIN HFA 108 (90 Base) MCG/ACT inhaler [id number] No INHALE 1-2 PUFFS BY MOUTH INTO THE LUNGS EVERY 4 HOURS AS NEEDED FOR WHEEZING OR SHORTNESS OF BREATH Kozlow, Donnamarie Poag, MD Taking Active   Zoster Vaccine Adjuvanted [facility]) injection [id number] No Inject 0.5 mLs into the muscle. [place] Callas, NP Taking Active  Patient Active Problem List   Diagnosis Date Noted   Left leg swelling 07/09/2020   Grief reaction 06/08/2020   Arm pain 02/10/2020   Dizziness on standing 12/27/2019   Generalized anxiety disorder with panic attacks 06/20/2019   Osteoarthritis of thumb, right 11/29/2018   Chronic migraine w/o aura w/o status migrainosus, not intractable 08/27/2018   Disorder of left eustachian tube 05/19/2017   Vertigo of central origin 01/23/2017   Central perforation of tympanic membrane of left ear 12/09/2016   Eustachian tube dysfunction, bilateral 07-11-16   Ear pain, right Jul 11, 2016   Spondylosis of lumbar region without myelopathy or radiculopathy 10/08/2015   BPPV (benign paroxysmal positional vertigo)  10/02/2015   Liver fibrosis ([place]) 12/04/2014   Pituitary microadenoma ([place]) 08/08/2014   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   Vitamin D deficiency 06/29/2014   Secondary adrenal insufficiency ([place]) 06/29/2014   History of tympanostomy tube placement 02/07/2014   Refusal of blood transfusions as patient is Jehovah's Witness 11/18/2013   Hepatitis C virus infection cured after antiviral drug therapy 03/16/2012   Health care maintenance 12/15/2011   Opioid dependence ([place]) 10/05/2010   HTN (hypertension) 10/05/2010   Hyperlipidemia associated with type 2 diabetes mellitus ([place]) 10/23/2008   HIV disease ([place]) 05/09/2006   Depression 05/09/2006   Allergic rhinitis 05/09/2006   GERD 05/09/2006    Conditions to be addressed/monitored per PCP order:   chronic healthcare management needs,  history of Rx Hepatitis C, HIV followed by ID Dr Johnnye Sima. 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 History of GERD, CKD and pancreatitis.  Jehovah's witness no blood products. History of atypical chest pain with normal stress echo in 2016 and normal Lexiscan Myovue in 2018 GERD with difficulty laying flat due to reflux.  Care Plan : General Plan of Care (Adult)  Updates made by Gayla Medicus, RN since 03/01/2021 12:00 AM     Problem: Health Promotion or Disease Self-Management (General Plan of Care)   Priority: Medium  Onset Date: 08/10/2020     Long-Range Goal: Self-Management Plan Developed   Start Date: 05/07/2020  Expected End Date: 05/01/2021  Recent Progress: On track  Priority: Medium  Note:   Current Barriers:  Chronic Disease Management support and education needs.  Patient with increased anxiety and wants to increase Wellbutrin dose.  Patient with an increase in left leg swelling-saw PCP-wearing compression stockings and elevating which helps. Patient helping to provide care to brother. This causes patient a lot of stress and anxiety.  She has recently started taking  Wellbutrin which she states is helping.  Patient declines any therapy services at this time. Update July 26, 2020: Patient's brother died 2020-07-11.  Patient is considering grief counseling with Hospice, declines SW referral at this time. Update 08/10/20:  Patient states she is doing better-doesn't feel like she needs anything right now. Update 09/09/20:  Patient without complaint today.  Breathing and itching improved with medication adjustment. Update 10/06/20:  Patient doing well, no complaints today, attending grief counseling as needed with Hospice. Update 11/05/20:  Patient speaking with grief counselor two times a month. Update 12/02/20:  Patient has increased Wellbutrin after speaking with her PCP to 300 mg and this is helping.  Left foot swelling, no change, elevates and wears compression stockings as needed. Update 12/30/20:  Patient without complaint today-left foot swelling less. Update 01/29/21:  Patient c/o left arm pain and dizziness today-states she will contact providers to schedule appointment.  Transportation issues, insurance transportation information provided-has [employer] transportation information already.  Update 03/01/21:  Patient without complaint today-is feeling better.  Nurse Case Manager Clinical Goal(s):  Over the next 30 days, patient will attend all scheduled medical appointments-patient will schedule appointments with providers to evaluate left arm pain and dizziness. Over the next 30 days, patient will work with CM team pharmacist to review medications. Update 06/02/20:  Patient met with Pharmacist 05/11/20 and has follow up scheduled. Update 06/30/20:  Patient continues to work with Pharmacist.  Interventions:  Inter-disciplinary care team collaboration (see longitudinal plan of care) Evaluation of current treatment plan and patient's adherence to plan as established by provider. Reviewed medications with patient. Collaborated with pharmacy regarding medications. Discussed plans  with patient for ongoing care management follow up and provided patient with direct contact information for care management team Reviewed scheduled/upcoming provider appointments. Update 06/30/20:  Patient has appointment with PCP 07/07/20. Update 09/09/20:  Patient attending all scheduled appointments. Update 11/05/20:  Patient will contact PCP regarding increasing Wellbutrin dose-completed. Update 01/29/21:  Patient will schedule appointments with providers for evaluation of left arm pain and dizziness-completed. Pharmacy referral for medication review-completed.  Patient Goals/Self-Care Activities Over the next 30 days, patient will:  -Attends all scheduled provider appointments Calls pharmacy for medication refills Calls provider office for new concerns or questions  Follow Up Plan: The Managed Medicaid care management team will reach out to the patient again over the next 30 days.  The patient has been provided with contact information for the Managed Medicaid care management team and has been advised to call with any health related questions or concerns.    Follow Up:  Patient agrees to Care Plan and Follow-up.  Plan: The Managed Medicaid care management team will reach out to the patient again over the next 30 days. and The  Patient has been provided with contact information for the Managed Medicaid care management team and has been advised to call with any health related questions or concerns.  Date/time of next scheduled RN care management/care coordination outreach:  03/31/21 at 0930.

## 2021-03-02 ENCOUNTER — Ambulatory Visit (INDEPENDENT_AMBULATORY_CARE_PROVIDER_SITE_OTHER): Payer: Medicaid Other | Admitting: Allergy and Immunology

## 2021-03-02 ENCOUNTER — Other Ambulatory Visit: Payer: Self-pay

## 2021-03-02 VITALS — BP 130/88 | HR 83 | Temp 97.8°F | Resp 18 | Ht 61.0 in | Wt 183.6 lb

## 2021-03-02 DIAGNOSIS — K219 Gastro-esophageal reflux disease without esophagitis: Secondary | ICD-10-CM | POA: Diagnosis not present

## 2021-03-02 DIAGNOSIS — L299 Pruritus, unspecified: Secondary | ICD-10-CM | POA: Diagnosis not present

## 2021-03-02 DIAGNOSIS — J4540 Moderate persistent asthma, uncomplicated: Secondary | ICD-10-CM

## 2021-03-02 DIAGNOSIS — J3089 Other allergic rhinitis: Secondary | ICD-10-CM | POA: Diagnosis not present

## 2021-03-02 MED ORDER — MONTELUKAST SODIUM 10 MG PO TABS: 10.0000 mg | ORAL_TABLET | Freq: Every day | ORAL | 5 refills | Status: DC

## 2021-03-02 MED ORDER — TRIAMCINOLONE ACETONIDE 55 MCG/ACT NA AERO
INHALATION_SPRAY | NASAL | 5 refills | Status: DC
Start: 2021-03-02 — End: 2021-09-07

## 2021-03-02 MED ORDER — FAMOTIDINE 40 MG PO TABS
40.0000 mg | ORAL_TABLET | Freq: Every day | ORAL | 5 refills | Status: DC
Start: 2021-03-02 — End: 2021-09-07

## 2021-03-02 MED ORDER — CETIRIZINE HCL 10 MG PO TABS: 20.0000 mg | ORAL_TABLET | Freq: Two times a day (BID) | ORAL | 5 refills | Status: DC

## 2021-03-02 MED ORDER — TRELEGY ELLIPTA 200-62.5-25 MCG/ACT IN AEPB: 2.0000 | INHALATION_SPRAY | Freq: Every day | RESPIRATORY_TRACT | 5 refills | Status: DC

## 2021-03-02 MED ORDER — ALBUTEROL SULFATE HFA 108 (90 BASE) MCG/ACT IN AERS: 2.0000 | INHALATION_SPRAY | RESPIRATORY_TRACT | 2 refills | Status: DC | PRN

## 2021-03-02 MED ORDER — GABAPENTIN 100 MG PO CAPS: 100.0000 mg | ORAL_CAPSULE | Freq: Three times a day (TID) | ORAL | 5 refills | Status: DC

## 2021-03-02 NOTE — Patient Instructions (Addendum)
  1.  Continue to Treat and prevent inflammation:   A.  Montelukast 10 mg - 1 tablet 1 time per day  B.  Trelegy 200 - 1 inhalations 1 time per day (Empty lungs)  C.  Nasacort -1 spray each nostril 1-2 times per day  2.  Continue to Treat and prevent reflux:   A.  Protonix 40 mg 2 times per day  B.  Famotidine 40 mg in p.m.    3.  Continue to treat and prevent itchiness:    A. cetirizine 10 mg -1-2 tablets 1-2 times a day (maximum 40mg )  B. INCREASE gabapentin 100 mg - 2 tablet 3 times a day  4.  If needed:   A.  OTC Benadryl  B.  OTC nasal saline  C.  Pro Air HFA 2 puffs every 4-6 hours  D.  Nasal saline  5. Return to clinic in 6 months or earlier if problem

## 2021-03-02 NOTE — Progress Notes (Signed)
[facility]   Follow-up Note  Referring Provider: [name], MD Primary Provider: [name], MD Date of Office Visit: 03/02/2021  Subjective:   [name] [name] (DOB: [birth year]) is a 60 y.o. female who returns to the [facility] on 03/02/2021 in re-evaluation of the following:  HPI: Gabriana returns to this clinic in evaluation of asthma, allergic rhinitis, LPR, and pruritic disorder.  Her last visit to this clinic was 25 Aug 2020.  Concerning her pruritic disorder, she has been doing relatively well while using gabapentin at a dose of 200 mg twice a day but she still developed some pruritus at around 10 hours after each dose.  Her nose has been doing relatively well.  Her asthma has been okay.  Because of an insurance issue we could not provide her with Judithann Sauger which worked really well and we had to switch her to [employer].  Trelegy does appear to work to some degree but it produces a "powder effect" in her mouth although this is tolerable.  Her reflux has been under good control.  Allergies as of 03/02/2021       Reactions   Acetaminophen Other (See Comments)   Inflamed liver, hospitalized REACTION: Had liver problems, was hospitalized Inflamed liver, hospitalized Other reaction(s): liver demage Other reaction(s): liver demage Other reaction(s): liver demage Other reaction(s): liver demage Other reaction(s): liver demage Other reaction(s): liver demage Other reaction(s): liver demage   Morphine Hives, Rash   REACTION: rash   Triamterene Hives   Aspirin-caffeine    Other reaction(s): liver demage Other reaction(s): liver demage Other reaction(s): liver demage   Citalopram    Other reaction(s): INCREASED ANXIETY Other reaction(s): INCREASED ANXIETY   Citalopram Hydrobromide    Other reaction(s): INCREASED ANXIETY   Dyazide [hydrochlorothiazide W-triamterene] Hives   Empagliflozin    Other  reaction(s): stomach ache, diarrhea Other reaction(s): stomach ache, diarrhea Other reaction(s): stomach ache, diarrhea   Emtricitabine-tenofovir Df    Other reaction(s): rash Other reaction(s): rash Other reaction(s): rash   Lisinopril    Cough    Metformin Hcl Er    Other reaction(s): upset stomach Other reaction(s): upset stomach   Morphine Sulfate    Other reaction(s): shortness of breath and hives Other reaction(s): shortness of breath and hives Other reaction(s): shortness of breath and hives   Topamax [topiramate]    Triamterene-hctz    Other reaction(s): rash Other reaction(s): rash Other reaction(s): rash   Losartan Rash   Pt had rash, worsening dizziness, and nausea after starting losartan, which improved after stopping losartan        Medication List    amLODipine 10 MG tablet Commonly known as: NORVASC TAKE 1 TABLET (10 MG TOTAL) BY MOUTH DAILY.   atorvastatin 10 MG tablet Commonly known as: LIPITOR TAKE HALF TABLET BY MOUTH DAILY   Biktarvy 50-200-25 MG Tabs tablet Generic drug: bictegravir-emtricitabine-tenofovir AF Take 1 tablet by mouth daily.   buPROPion 300 MG 24 hr tablet Commonly known as: Wellbutrin XL Take 1 tablet (300 mg total) by mouth daily.   cetirizine 10 MG tablet Commonly known as: ZYRTEC 1 tablet daily   cholecalciferol 25 MCG (1000 UNIT) tablet Commonly known as: VITAMIN D TAKE ONE TABLET BY MOUTH DAILY   famotidine 40 MG tablet Commonly known as: PEPCID TAKE ONE TABLET BY MOUTH AT BEDTIME   gabapentin 100 MG capsule Commonly known as: NEURONTIN Take 1 capsule (100 mg total) by mouth 3 (three) times  daily.   Neurontin 100 MG capsule Generic drug: gabapentin Take 2 capsules (200 mg total) by mouth 2 (two) times daily.   hydrocortisone 10 MG tablet Commonly known as: CORTEF Take 10 mg by mouth as directed. Pt takes once a day in morning.   hydrocortisone 5 MG tablet Commonly known as: CORTEF Take 1 tablet by mouth  every morning.   liraglutide 18 MG/3ML Sopn Commonly known as: VICTOZA Inject into the skin.   metoprolol tartrate 25 MG tablet Commonly known as: LOPRESSOR TAKE ONE TABLET BY MOUTH TWICE A DAY   montelukast 10 MG tablet Commonly known as: SINGULAIR TAKE ONE TABLET BY MOUTH AT BEDTIME   nitroGLYCERIN 0.4 MG SL tablet Commonly known as: NITROSTAT Place 1 tablet (0.4 mg total) under the tongue every 5 (five) minutes as needed for chest pain.   nortriptyline 10 MG capsule Commonly known as: PAMELOR Take 2 capsules (20 mg total) by mouth at bedtime.   pantoprazole 40 MG tablet Commonly known as: PROTONIX TAKE ONE TABLET BY MOUTH TWICE A DAY   potassium chloride SA 20 MEQ tablet Commonly known as: KLOR-CON Take 20 mEq by mouth daily.   promethazine 25 MG tablet Commonly known as: PHENERGAN Take 1 tablet (25 mg total) by mouth every 6 (six) hours as needed for nausea or vomiting.   Shingrix injection Generic drug: Zoster Vaccine Adjuvanted Inject 0.5 mLs into the muscle.   sodium chloride 0.65 % Soln nasal spray Commonly known as: OCEAN PLACE 1 SPRAY INTO BOTH NOSTRILS AS NEEDED FOR CONGESTION.   SUMAtriptan 25 MG tablet Commonly known as: Imitrex Take 1 tablet (25 mg total) by mouth every 2 (two) hours as needed for migraine. May repeat in 2 hours if headache persists or recurs.   triamcinolone 55 MCG/ACT Aero nasal inhaler Commonly known as: NASACORT 1 spray each nostril 2 times per day   Ventolin HFA 108 (90 Base) MCG/ACT inhaler Generic drug: albuterol INHALE 1-2 PUFFS BY MOUTH INTO THE LUNGS EVERY 4 HOURS AS NEEDED FOR WHEEZING OR SHORTNESS OF BREATH    Past Medical History:  Diagnosis Date   Allergic rhinitis 05/09/2006   Allergy    Anxiety    Arthritis    Asthma    CHF (congestive heart failure) (HCC)    Chronic back pain    Diabetes mellitus without complication (HCC)    GERD (gastroesophageal reflux disease)    Heart murmur    as a child    Hepatitis C    genotype 1b, 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 s/p 12 week course of simeprevir and sofosbuvir between October 2014 and January 2015 with resolution.   History of shingles    HIV infection ([place])    1994   Hyperlipidemia    no meds taken now   Hypertension    Migraine    Pituitary microadenoma ([place]) 08/08/2014   Prediabetes    Refusal of blood transfusions as patient is Jehovah's Witness    Secondary adrenal insufficiency ([place]) 06/29/2014   Urticaria     Past Surgical History:  Procedure Laterality Date   BUNIONECTOMY     b/l   COLECTOMY     2003 for diverticulitis, had colostomy bag and then reversed   COLONOSCOPY     HAND SURGERY     NASAL SINUS SURGERY     SHOULDER SURGERY     left   TONSILLECTOMY      Review of systems negative except as noted in HPI /  PMHx or noted below:  Review of Systems  Constitutional: Negative.   HENT: Negative.    Eyes: Negative.   Respiratory: Negative.    Cardiovascular: Negative.   Gastrointestinal: Negative.   Genitourinary: Negative.   Musculoskeletal: Negative.   Skin: Negative.   Neurological: Negative.   Endo/Heme/Allergies: Negative.   Psychiatric/Behavioral: Negative.      Objective:   Vitals:   03/02/21 1022  BP: 130/88  Pulse: 83  Resp: 18  Temp: 97.8 F (36.6 C)  SpO2: 96%   Height: 5\' 1"  (154.9 cm)  Weight: 183 lb 9.6 oz (83.3 kg)   Physical Exam Constitutional:      Appearance: She is not diaphoretic.  HENT:     Head: Normocephalic.     Right Ear: External ear normal.     Left Ear: External ear normal.     Nose: Nose normal. No mucosal edema or rhinorrhea.     Mouth/Throat:     Pharynx: Uvula midline. No oropharyngeal exudate.  Eyes:     Conjunctiva/sclera: Conjunctivae normal.  Neck:     Thyroid: No thyromegaly.     Trachea: Trachea normal. No tracheal tenderness or tracheal deviation.  Cardiovascular:     Rate and Rhythm: Normal rate and regular rhythm.     Heart  sounds: Normal heart sounds, S1 normal and S2 normal. No murmur heard. Pulmonary:     Effort: No respiratory distress.     Breath sounds: Normal breath sounds. No stridor. No wheezing or rales.  Lymphadenopathy:     Head:     Right side of head: No tonsillar adenopathy.     Left side of head: No tonsillar adenopathy.     Cervical: No cervical adenopathy.  Skin:    Findings: No erythema or rash.     Nails: There is no clubbing.  Neurological:     Mental Status: She is alert.    Diagnostics:    Spirometry was performed and demonstrated an FEV1 of 1.10 at 56 % of predicted.  Assessment and Plan:   1. Asthma, moderate persistent, well-controlled   2. Other allergic rhinitis   3. LPRD (laryngopharyngeal reflux disease)   4. Pruritic disorder     1.  Continue to Treat and prevent inflammation:   A.  Montelukast 10 mg - 1 tablet 1 time per day  B.  Trelegy 200 - 1 inhalations 1 time per day (Empty lungs)  C.  Nasacort -1 spray each nostril 1-2 times per day  2.  Continue to Treat and prevent reflux:   A.  Protonix 40 mg 2 times per day  B.  Famotidine 40 mg in p.m.    3.  Continue to treat and prevent itchiness:    A. cetirizine 10 mg -1-2 tablets 1-2 times a day (maximum 40mg )  B. INCREASE gabapentin 100 mg - 2 tablet 3 times a day  4.  If needed:   A.  OTC Benadryl  B.  OTC nasal saline  C.  Pro Air HFA 2 puffs every 4-6 hours  D.  Nasal saline  5. Return to clinic in 6 months or earlier if problem  Wonda appears to be doing very well on her current plan regarding her airway and reflux.  We will increase her gabapentin to 200 mg 3 times per day and hopefully this will take care of the remainder of her pruritic disorder.  Assuming she does well with this plan I will see her back in this clinic in 6  months or earlier if there is a problem.  Allena Katz, MD Allergy / Immunology [place]

## 2021-03-03 ENCOUNTER — Encounter: Payer: Self-pay | Admitting: Allergy and Immunology

## 2021-03-04 ENCOUNTER — Encounter: Payer: Self-pay | Admitting: Dietician

## 2021-03-04 ENCOUNTER — Other Ambulatory Visit (HOSPITAL_COMMUNITY): Payer: Self-pay

## 2021-03-08 ENCOUNTER — Ambulatory Visit: Payer: Self-pay

## 2021-03-08 DIAGNOSIS — D352 Benign neoplasm of pituitary gland: Secondary | ICD-10-CM | POA: Diagnosis not present

## 2021-03-08 DIAGNOSIS — E2749 Other adrenocortical insufficiency: Secondary | ICD-10-CM | POA: Diagnosis not present

## 2021-03-08 DIAGNOSIS — E119 Type 2 diabetes mellitus without complications: Secondary | ICD-10-CM | POA: Diagnosis not present

## 2021-03-31 ENCOUNTER — Other Ambulatory Visit: Payer: Self-pay | Admitting: Obstetrics and Gynecology

## 2021-03-31 NOTE — Patient Instructions (Signed)
Hi [name] [name], sorry I missed you today, I hope you are doing well- as a part of your Medicaid benefit, you are eligible for care management and care coordination services at no cost or copay. I was unable to reach you by phone today but would be happy to help you with your health related needs. Please feel free to call me at [phone]  A member of the Managed Medicaid care management team will reach out to you again over the next 7-14 days.   Aida Raider RN, BSN [place]  [facility] - Managed Medicaid High Risk [phone].

## 2021-03-31 NOTE — Patient Outreach (Signed)
Care Coordination  03/31/2021  [name] [name] October 28, 1960 [id number]   Medicaid Managed Care   Unsuccessful Outreach Note  03/31/2021 Name: [name] [name] MRN: [id number] DOB: 1960-06-19  Referred by: [name], MD Reason for referral : High Risk Managed Medicaid (Unsuccessful telephone outreach)   An unsuccessful telephone outreach was attempted today. The patient was referred to the case management team for assistance with care management and care coordination.   Follow Up Plan: The care management team will reach out to the patient again over the next 7-14 days.   Aida Raider RN, BSN [place]  [facility] - Managed Medicaid High Risk [phone].

## 2021-04-09 ENCOUNTER — Telehealth: Payer: Self-pay | Admitting: Allergy and Immunology

## 2021-04-09 NOTE — Telephone Encounter (Signed)
[name] [name] called in and states she tried to pick up Trelegy from the pharmacy and it was not covered.  The pharmacy stated they sent over a prior authorization.  [name] [name] is just wanting to know if anyone knows anything about the prior auth.  Please advise.

## 2021-04-09 NOTE — Telephone Encounter (Signed)
PA has been submitted through CoverMyMeds for Trelegy and is currently pending approval/denial. Called and left a voicemail asking for patient to return call to inform.

## 2021-04-12 ENCOUNTER — Other Ambulatory Visit: Payer: Self-pay | Admitting: *Deleted

## 2021-04-12 MED ORDER — VENTOLIN HFA 108 (90 BASE) MCG/ACT IN AERS: 2.0000 | INHALATION_SPRAY | RESPIRATORY_TRACT | 1 refills | Status: DC | PRN

## 2021-04-12 MED ORDER — SYMBICORT 160-4.5 MCG/ACT IN AERO: 2.0000 | INHALATION_SPRAY | Freq: Two times a day (BID) | RESPIRATORY_TRACT | 5 refills | Status: DC

## 2021-04-12 NOTE — Telephone Encounter (Signed)
Patient returned Ashleigh's call. I did advise patient of Ashleigh's message. Patient states she is out of her trelegy and in need of some.   Is it okay to give a sample to patient?   Best contact number: [phone]

## 2021-04-12 NOTE — Telephone Encounter (Signed)
New prescription has been sent in. Called patient and advised of medication change and the Symbicort inhaler being sent in. Patient verbalized understanding and asked if her ProAir has changed, I did advise that it has not however it is no longer made, I asked if she would like for me to go ahead and send in Ventolin which is covered by her insurance, patient was fine with this. Ventolin has been sent in to the patients pharmacy as well. Patient verbalized understanding.

## 2021-04-12 NOTE — Telephone Encounter (Signed)
I can only see in her chart that the patient has tried Librarian, academic and Pulmicort.

## 2021-04-12 NOTE — Telephone Encounter (Signed)
PA was denied for Trelegy stating that the patient must try and fail Brand Advair Diskus, Advair HFA, Dulera and brand Symbicort. Please advise change in inhaler.

## 2021-04-14 ENCOUNTER — Telehealth: Payer: Self-pay | Admitting: Student

## 2021-04-14 ENCOUNTER — Other Ambulatory Visit: Payer: Self-pay | Admitting: Obstetrics and Gynecology

## 2021-04-14 ENCOUNTER — Other Ambulatory Visit: Payer: Self-pay

## 2021-04-14 NOTE — Telephone Encounter (Signed)
.. °  Medicaid Managed Care   Unsuccessful Outreach Note  04/14/2021 Name: [name] [name] MRN: [id number] DOB: 10-23-1960  Referred by: [name], MD Reason for referral : High Risk Managed Medicaid (I called the patient today to get her rescheduled with the MM Pharmacist. I left my name and number on her VM.)   An unsuccessful telephone outreach was attempted today. The patient was referred to the case management team for assistance with care management and care coordination.   Follow Up Plan: The care management team will reach out to the patient again over the next 7 days.   [place]

## 2021-04-14 NOTE — Patient Instructions (Signed)
Hi [name] [name] [name]-have a wonderful day!!  [name] [name] [name] was given information about Medicaid Managed Care team care coordination services as a part of their [place] Medicaid benefit. [name] [name] [name] verbally consented to engagement with the [facility] Managed Care team.   If [name] [name] are experiencing a medical emergency, please call 911 or report to your local emergency department or urgent care.   If [name] [name] have a non-emergency medical problem during routine business hours, please contact your provider's office and ask to speak with a nurse.   For questions related to your [facility], please call: [phone] or visit the homepage here: [email or web address]  If [name] [name] would like to schedule transportation through your [facility], please call the following number at least 2 days in advance of your appointment: [phone].   Call the [facility] at [phone], at any time, 24 hours a day, 7 days a week. If [name] [name] are in danger or need immediate medical attention call 911.  If [name] [name] would like help to quit smoking, call 1-800-QUIT-NOW 209-555-8278) OR Espaol: 1-855-Djelo-Ya (2-725-366-4403) o para ms informacin haga clic aqu or Text READY to 200-400 to register via text  [name] [name] [name] - following are the goals we discussed in your visit today:   Goals Addressed             This Visit's Progress    Protect My Health       Timeframe:  Long-Range Goal Priority:  Medium Start Date:      05/07/20                       Expected End Date: ongoing             Follow Up Date: 04/14/21   - schedule appointment for vaccines needed due to my age or health - schedule recommended health tests (blood work, mammogram, colonoscopy, pap test) - schedule and keep appointment for annual check-up    Why is this important?   Screening tests  can find diseases early when they are easier to treat.  Your doctor or nurse will talk with [name] [name] about which tests are important for [name] [name].  Getting shots for common diseases like the flu and shingles will help prevent them.    04/14/21:  Patient with complaints of left shoulder pain today-has scheduled an appointment with Dr. Marlou Sa for evaluation    The patient verbalized understanding of instructions provided today and declined a print copy of patient instruction materials.   The Managed Medicaid care management team will reach out to the patient again over the next 30 days.  The  Patient  has been provided with contact information for the Managed Medicaid care management team and has been advised to call with any health related questions or concerns.   Aida Raider RN, BSN [place]   [facility]ator - Managed Medicaid High Risk [phone].   Following is a copy of your plan of care:  Care Plan : General Plan of Care (Adult)  Updates made by [name] Medicus, RN since 04/14/2021 12:00 AM     Problem: Health Promotion or Disease Self-Management (General Plan of Care)   Priority: Medium  Onset Date: 08/10/2020     Long-Range Goal: Self-Management Plan Developed   Start Date: 05/07/2020  Expected End Date: 07/13/2021  Recent Progress: On track  Priority: Medium  Note:  Current Barriers:  Chronic Disease Management support and education needs.  04/14/21:  patient with left shoulder pain today-thinks it is arthritis-has appointment with Dr. Marlou Sa for evaluation.  Nurse Case Manager Clinical Goal(s):  Over the next 30 days, patient will attend all scheduled medical appointments Over the next 30 days, patient will work with CM team pharmacist to review medications.  Interventions:  Inter-disciplinary care team collaboration (see longitudinal plan of care) Evaluation of current treatment plan and patient's adherence to plan as established by  provider. Reviewed medications with patient. Collaborated with pharmacy regarding medications. Discussed plans with patient for ongoing care management follow up and provided patient with direct contact information for care management team Reviewed scheduled/upcoming provider appointments. Pharmacy referral for medication review  Patient Goals/Self-Care Activities Over the next 30 days, patient will:  -Attends all scheduled provider appointments Calls pharmacy for medication refills Calls provider office for new concerns or questions  Follow Up Plan: The Managed Medicaid care management team will reach out to the patient again over the next 30 days.  The patient has been provided with contact information for the Managed Medicaid care management team and has been advised to call with any health related questions or concerns.

## 2021-04-14 NOTE — Patient Outreach (Signed)
Medicaid Managed Care   Nurse Care Manager Note  04/14/2021 Name:  [name] [name] MRN:  [id number] DOB:  [birth year]  [name] [name] is an 60 y.o. year old female who is a primary patient of [name] Dame, MD.  The [facility] Managed Care Coordination team was consulted for assistance with:    Chronic healthcare management needs.  [name] was given information about Medicaid Managed Care Coordination team services today. [name] [name] Patient agreed to services and verbal consent obtained.  Engaged with patient by telephone for follow up visit in response to provider referral for case management and/or care coordination services.   Assessments/Interventions:  Review of past medical history, allergies, medications, health status, including review of consultants reports, laboratory and other test data, was performed as part of comprehensive evaluation and provision of chronic care management services.  SDOH (Social Determinants of Health) assessments and interventions performed: SDOH Interventions    Flowsheet Row Most Recent Value  SDOH Interventions   Physical Activity Interventions Intervention Not Indicated  [does chair exercises every other day and walks]       Care Plan  Allergies  Allergen Reactions   Acetaminophen Other (See Comments)    Inflamed liver, hospitalized REACTION: Had liver problems, was hospitalized Inflamed liver, hospitalized Other reaction(s): liver demage Other reaction(s): liver demage Other reaction(s): liver demage Other reaction(s): liver demage Other reaction(s): liver demage Other reaction(s): liver demage Other reaction(s): liver demage   Morphine Hives and Rash    REACTION: rash   Triamterene Hives   Aspirin-Caffeine     Other reaction(s): liver demage Other reaction(s): liver demage Other reaction(s): liver demage   Citalopram     Other reaction(s): INCREASED ANXIETY Other reaction(s): INCREASED ANXIETY   Citalopram Hydrobromide      Other reaction(s): INCREASED ANXIETY   Dyazide [Hydrochlorothiazide W-Triamterene] Hives   Empagliflozin     Other reaction(s): stomach ache, diarrhea Other reaction(s): stomach ache, diarrhea Other reaction(s): stomach ache, diarrhea   Emtricitabine-Tenofovir Df     Other reaction(s): rash Other reaction(s): rash Other reaction(s): rash   Lisinopril     Cough    Metformin Hcl Er     Other reaction(s): upset stomach Other reaction(s): upset stomach   Morphine Sulfate     Other reaction(s): shortness of breath and hives Other reaction(s): shortness of breath and hives Other reaction(s): shortness of breath and hives   Topamax [Topiramate]    Triamterene-Hctz     Other reaction(s): rash Other reaction(s): rash Other reaction(s): rash   Losartan Rash    Pt had rash, worsening dizziness, and nausea after starting losartan, which improved after stopping losartan    Medications Reviewed Today     Reviewed by Gayla Medicus, RN (Registered Nurse) on 04/14/21 at [phone]  Med List Status: <None>   Medication Order Taking? Sig Documenting Provider Last Dose Status Informant  albuterol (VENTOLIN HFA) 108 (90 Base) MCG/ACT inhaler [id number]  Inhale 2 puffs into the lungs every 4 (four) hours as needed for wheezing or shortness of breath. Kozlow, Donnamarie Poag, MD  Active   amLODipine (NORVASC) 10 MG tablet [id number] No TAKE 1 TABLET (10 MG TOTAL) BY MOUTH DAILY. [name] Dame, MD Taking Active   atorvastatin (LIPITOR) 10 MG tablet [id number] No TAKE HALF TABLET BY MOUTH DAILY Katsadouros, Vasilios, MD Taking Active   bictegravir-emtricitabine-tenofovir AF (BIKTARVY) 50-200-25 MG TABS tablet [id number] No Take 1 tablet by mouth daily. Crane Callas, NP Taking Active   buPROPion (WELLBUTRIN XL) 300 MG 24  hr tablet [id number] No Take 1 tablet (300 mg total) by mouth daily. [name] Dame, MD Taking Active   cetirizine (ZYRTEC) 10 MG tablet [id number]  Take 2 tablets (20 mg total) by mouth 2  (two) times daily. 1 tablet daily Kozlow, Donnamarie Poag, MD  Active   cholecalciferol (VITAMIN D) 25 MCG (1000 UNIT) tablet [id number] No TAKE ONE TABLET BY MOUTH DAILY [name] Dame, MD Taking Active   famotidine (PEPCID) 40 MG tablet [id number]  Take 1 tablet (40 mg total) by mouth at bedtime. Kozlow, Donnamarie Poag, MD  Active   Fluticasone-Umeclidin-Vilant [facility] ELLIPTA) 200-62.5-25 MCG/ACT AEPB [id number]  Inhale 2 puffs into the lungs daily. Kozlow, Donnamarie Poag, MD  Active   gabapentin (NEURONTIN) 100 MG capsule [id number]  Take 1 capsule (100 mg total) by mouth 3 (three) times daily. Kozlow, Donnamarie Poag, MD  Active   hydrocortisone (CORTEF) 10 MG tablet [id number] No Take 10 mg by mouth as directed. Pt takes once a day in morning. [provider] Taking Active Self           Med Note (Keiry Kowal, Lorel [place]   Fri Jan 29, 2021  9:22 AM)    hydrocortisone (CORTEF) 5 MG tablet [id number] No Take 1 tablet by mouth every morning. [provider] Taking Active   liraglutide (VICTOZA) 18 MG/3ML SOPN [id number] No Inject into the skin. [provider] Taking Active   metoprolol tartrate (LOPRESSOR) 25 MG tablet [id number] No TAKE ONE TABLET BY MOUTH TWICE A Harlow Mares, MD Taking Active   montelukast (SINGULAIR) 10 MG tablet [id number]  Take 1 tablet (10 mg total) by mouth at bedtime. Jiles Prows, MD  Active   NEURONTIN 100 MG capsule [id number] No Take 2 capsules (200 mg total) by mouth 2 (two) times daily. Kozlow, Donnamarie Poag, MD Taking Active   nitroGLYCERIN (NITROSTAT) 0.4 MG SL tablet [id number] No Place 1 tablet (0.4 mg total) under the tongue every 5 (five) minutes as needed for chest pain. Isaiah Serge, NP Taking Active   nortriptyline (PAMELOR) 10 MG capsule [id number] No Take 2 capsules (20 mg total) by mouth at bedtime. Marcial Pacas, MD Taking Active   pantoprazole (PROTONIX) 40 MG tablet [id number] No TAKE ONE TABLET BY MOUTH TWICE A Harlow Mares, MD Taking Active   potassium chloride  SA (KLOR-CON) 20 MEQ tablet [id number] No Take 20 mEq by mouth daily. [provider] Taking Active   promethazine (PHENERGAN) 25 MG tablet [id number] No Take 1 tablet (25 mg total) by mouth every 6 (six) hours as needed for nausea or vomiting. [name], DO Taking Active   sodium chloride (OCEAN) 0.65 % SOLN nasal spray [id number] No PLACE 1 SPRAY INTO BOTH NOSTRILS AS NEEDED FOR CONGESTION. [name], DO Taking Active   SUMAtriptan (IMITREX) 25 MG tablet [id number] No Take 1 tablet (25 mg total) by mouth every 2 (two) hours as needed for migraine. May repeat in 2 hours if headache persists or recurs. Marcial Pacas, MD Taking Active   SYMBICORT 160-4.5 MCG/ACT inhaler [id number]  Inhale 2 puffs into the lungs 2 (two) times daily. Kozlow, Donnamarie Poag, MD  Active   triamcinolone (NASACORT) 55 MCG/ACT AERO nasal inhaler [id number]  1 spray each nostril 2 times per day Jiles Prows, MD  Active   VENTOLIN HFA 108 (90 Base) MCG/ACT inhaler [id number]  Inhale 2 puffs into the lungs every 4 (four) hours as needed for wheezing or shortness of breath. Kozlow, Donnamarie Poag, MD  Active   Zoster Vaccine Adjuvanted [facility]) injection [id number] No Inject 0.5 mLs into the muscle. [place] Callas, NP Taking Active             Patient Active Problem List   Diagnosis Date Noted   Left leg swelling 07/09/2020   Grief reaction 06/08/2020   Arm pain 02/10/2020   Dizziness on standing 12/27/2019   Generalized anxiety disorder with panic attacks 06/20/2019   Osteoarthritis of thumb, right 11/29/2018   Chronic migraine w/o aura w/o status migrainosus, not intractable 08/27/2018   Disorder of left eustachian tube 05/19/2017   Vertigo of central origin 01/23/2017   Central perforation of tympanic membrane of left ear 12/09/2016   Eustachian tube dysfunction, bilateral 06/15/2016   Ear pain, right 06/15/2016   Spondylosis of lumbar region without myelopathy or radiculopathy 10/08/2015    BPPV (benign paroxysmal positional vertigo) 10/02/2015   Liver fibrosis ([place]) 12/04/2014   Pituitary microadenoma ([place]) 08/08/2014   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   Vitamin D deficiency 06/29/2014   Secondary adrenal insufficiency ([place]) 06/29/2014   History of tympanostomy tube placement 02/07/2014   Refusal of blood transfusions as patient is Jehovah's Witness 11/18/2013   Hepatitis C virus infection cured after antiviral drug therapy 03/16/2012   Health care maintenance 12/15/2011   Opioid dependence ([place]) 10/05/2010   HTN (hypertension) 10/05/2010   Hyperlipidemia associated with type 2 diabetes mellitus ([place]) 10/23/2008   HIV disease ([place]) 05/09/2006   Depression 05/09/2006   Allergic rhinitis 05/09/2006   GERD 05/09/2006    Conditions to be addressed/monitored per PCP order:   chronic healthcare management needs, HIV, headaches, DM, HTN, asthma, GERD, anxiety, osteoarthritis.  Care Plan : General Plan of Care (Adult)  Updates made by Gayla Medicus, RN since 04/14/2021 12:00 AM     Problem: Health Promotion or Disease Self-Management (General Plan of Care)   Priority: Medium  Onset Date: 08/10/2020     Long-Range Goal: Self-Management Plan Developed   Start Date: 05/07/2020  Expected End Date: 07/13/2021  Recent Progress: On track  Priority: Medium  Note:   Current Barriers:  Chronic Disease Management support and education needs.  04/14/21:  patient with left shoulder pain today-thinks it is arthritis-has appointment with Dr. Marlou Sa for evaluation.  Nurse Case Manager Clinical Goal(s):  Over the next 30 days, patient will attend all scheduled medical appointments Over the next 30 days, patient will work with CM team pharmacist to review medications.  Interventions:  Inter-disciplinary care team collaboration (see longitudinal plan of care) Evaluation of current treatment plan and patient's adherence to plan as established by provider. Reviewed  medications with patient. Collaborated with pharmacy regarding medications. Discussed plans with patient for ongoing care management follow up and provided patient with direct contact information for care management team Reviewed scheduled/upcoming provider appointments. Pharmacy referral for medication review  Patient Goals/Self-Care Activities Over the next 30 days, patient will:  -Attends all scheduled provider appointments Calls pharmacy for medication refills Calls provider office for new concerns or questions  Follow Up Plan: The Managed Medicaid care management team will reach out to the patient again over the next 30 days.  The patient has been provided with contact information for the Managed Medicaid care management team and has been advised to call with any health related questions or concerns.    Follow Up:  Patient agrees to Care Plan and Follow-up.  Plan: The Managed Medicaid care management team will reach out to the patient again over the  next 30 days. and The  Patient has been provided with contact information for the Managed Medicaid care management team and has been advised to call with any health related questions or concerns.  Date/time of next scheduled RN care management/care coordination outreach:  05/11/21 at 0900.

## 2021-05-04 ENCOUNTER — Encounter: Payer: Medicaid Other | Attending: Physical Medicine & Rehabilitation | Admitting: Physical Medicine & Rehabilitation

## 2021-05-04 ENCOUNTER — Encounter: Payer: Self-pay | Admitting: Physical Medicine & Rehabilitation

## 2021-05-04 ENCOUNTER — Other Ambulatory Visit: Payer: Self-pay

## 2021-05-04 VITALS — BP 121/79 | HR 93 | Temp 98.1°F | Ht 61.0 in | Wt 179.0 lb

## 2021-05-04 DIAGNOSIS — M461 Sacroiliitis, not elsewhere classified: Secondary | ICD-10-CM | POA: Insufficient documentation

## 2021-05-04 NOTE — Procedures (Signed)
Bilateral sacroiliac injections under fluoroscopic guidance  Indication: Low back and buttocks pain not relieved by medication management and other conservative care.Has had failure of PT and OTC meds.  Hx HIV, Last CD4 02/02/21 was 608/uL (>400)  Informed consent was obtained after describing risks and benefits of the procedure with the patient, this includes bleeding, bruising, infection, paralysis and medication side effects. The patient wishes to proceed and has given written consent. The patient was placed in a prone position. The lumbar and sacral area was marked and prepped with Betadine. A 25-gauge 1-1/2 inch needle was inserted into the skin and subcutaneous tissue and 1 mL of 1% lidocaine was injected into each side. Then a 25-gauge 3 inch spinal needle was inserted under fluoroscopic guidance into the left sacroiliac joint. AP and lateral images were utilized. Omnipaque 180x0.5 mL under live fluoroscopy demonstrated no intravascular uptake. Then a solution containing one ML of 6 mg per mL Celestone in 2 ML of 2% lidocaine MPF was injected x1.5 mL. This same procedure was repeated on the right side using the same needle, injectate, and technique. Patient tolerated the procedure well. Post procedure instructions were given. Please see post procedure form.

## 2021-05-04 NOTE — Progress Notes (Signed)
Bilateral sacroiliac injections under fluoroscopic guidance  Indication: Low back and buttocks pain not relieved by medication management and other conservative care.Has had failure of PT and OTC meds.  Hx HIV, Last CD4 Oct 2022  608  Informed consent was obtained after describing risks and benefits of the procedure with the patient, this includes bleeding, bruising, infection, paralysis and medication side effects. The patient wishes to proceed and has given written consent. The patient was placed in a prone position. The lumbar and sacral area was marked and prepped with Betadine. A 25-gauge 1-1/2 inch needle was inserted into the skin and subcutaneous tissue and 1 mL of 1% lidocaine was injected into each side. Then a 25-gauge 3 inch spinal needle was inserted under fluoroscopic guidance into the left sacroiliac joint. AP and lateral images were utilized. Omnipaque 180x0.5 mL under live fluoroscopy demonstrated no intravascular uptake. Then a solution containing one ML of 6 mg per mL Celestone in 2 ML of 2% lidocaine MPF was injected x1.5 mL. This same procedure was repeated on the right side using the same needle, injectate, and technique. Patient tolerated the procedure well. Post procedure instructions were given. Please see post procedure form.  

## 2021-05-04 NOTE — Patient Instructions (Signed)
Sacroiliac injection was performed today. A combination of numbing medicine (lidocaine) plus a cortisone medicine (betamethasone) was injected. The injection was done under x-ray guidance. This procedure has been performed to help reduce low back and buttocks pain as well as potentially hip pain. The duration of this injection is variable lasting from hours to  Months. It may repeated if needed. 

## 2021-05-04 NOTE — Progress Notes (Signed)
°  PROCEDURE RECORD [place] [facility]   Name: COLENA KETTERMAN DOB:1960/12/01 MRN: [id number]  Date:05/04/2021  Physician: Alysia Penna, MD    Nurse/CMA: Ronnald Ramp, CMA  Allergies:  Allergies  Allergen Reactions   Acetaminophen Other (See Comments)    Inflamed liver, hospitalized REACTION: Had liver problems, was hospitalized Inflamed liver, hospitalized Other reaction(s): liver demage Other reaction(s): liver demage Other reaction(s): liver demage Other reaction(s): liver demage Other reaction(s): liver demage Other reaction(s): liver demage Other reaction(s): liver demage   Morphine Hives and Rash    REACTION: rash   Triamterene Hives   Aspirin-Caffeine     Other reaction(s): liver demage Other reaction(s): liver demage Other reaction(s): liver demage   Citalopram     Other reaction(s): INCREASED ANXIETY Other reaction(s): INCREASED ANXIETY   Citalopram Hydrobromide     Other reaction(s): INCREASED ANXIETY   Dyazide [Hydrochlorothiazide W-Triamterene] Hives   Empagliflozin     Other reaction(s): stomach ache, diarrhea Other reaction(s): stomach ache, diarrhea Other reaction(s): stomach ache, diarrhea   Emtricitabine-Tenofovir Df     Other reaction(s): rash Other reaction(s): rash Other reaction(s): rash   Lisinopril     Cough    Metformin Hcl Er     Other reaction(s): upset stomach Other reaction(s): upset stomach   Morphine Sulfate     Other reaction(s): shortness of breath and hives Other reaction(s): shortness of breath and hives Other reaction(s): shortness of breath and hives   Topamax [Topiramate]    Triamterene-Hctz     Other reaction(s): rash Other reaction(s): rash Other reaction(s): rash   Losartan Rash    Pt had rash, worsening dizziness, and nausea after starting losartan, which improved after stopping losartan    Consent Signed: Yes.    Is patient diabetic? Yes.    CBG today? .  Pregnant: No. LMP: No LMP  recorded. Patient is postmenopausal. (age 35-55)  Anticoagulants: no Anti-inflammatory: no Antibiotics: no  Procedure: Bilateral Sacroiliac Injection  Position: Prone Start Time: 11:36  End Time: 11:45  Fluoro Time: 34  RN/CMA Jalee Saine, CMA [place] San[place], CMA    Time 11:02 11:51    BP 163/95 121/79    Pulse 87 93    Respirations 18 16    O2 Sat 97 92    S/S 6 6    Pain Level 10/10 7/10     D/C home with Melburn Popper, [name] & O X 3, D/C instructions reviewed, and sits independently.

## 2021-05-10 ENCOUNTER — Other Ambulatory Visit (HOSPITAL_COMMUNITY): Payer: Self-pay

## 2021-05-11 ENCOUNTER — Other Ambulatory Visit: Payer: Self-pay | Admitting: Obstetrics and Gynecology

## 2021-05-11 ENCOUNTER — Other Ambulatory Visit: Payer: Self-pay

## 2021-05-11 DIAGNOSIS — E2749 Other adrenocortical insufficiency: Secondary | ICD-10-CM | POA: Diagnosis not present

## 2021-05-11 DIAGNOSIS — E119 Type 2 diabetes mellitus without complications: Secondary | ICD-10-CM | POA: Diagnosis not present

## 2021-05-11 DIAGNOSIS — D352 Benign neoplasm of pituitary gland: Secondary | ICD-10-CM | POA: Diagnosis not present

## 2021-05-11 DIAGNOSIS — H8110 Benign paroxysmal vertigo, unspecified ear: Secondary | ICD-10-CM | POA: Diagnosis not present

## 2021-05-11 NOTE — Patient Outreach (Signed)
Medicaid Managed Care   Nurse Care Manager Note  05/11/2021 Name:  [name] [name] MRN:  [id number] DOB:  05-Jul-1960  [name] [name] is an 61 y.o. year old female who is a primary patient of [name] Dame, MD.  The [facility] Managed Care Coordination team was consulted for assistance with:    Chronic healthcare management needs.  [name] was given information about Medicaid Managed Care Coordination team services today. [name] [name] Patient agreed to services and verbal consent obtained.  Engaged with patient by telephone for follow up visit in response to provider referral for case management and/or care coordination services.   Assessments/Interventions:  Review of past medical history, allergies, medications, health status, including review of consultants reports, laboratory and other test data, was performed as part of comprehensive evaluation and provision of chronic care management services.  SDOH (Social Determinants of Health) assessments and interventions performed: SDOH Interventions    Flowsheet Row Most Recent Value  SDOH Interventions   Food Insecurity Interventions Intervention Not Indicated  Social Connections Interventions Intervention Not Indicated       Care Plan  Allergies  Allergen Reactions   Acetaminophen Other (See Comments)    Inflamed liver, hospitalized REACTION: Had liver problems, was hospitalized Inflamed liver, hospitalized Other reaction(s): liver demage Other reaction(s): liver demage Other reaction(s): liver demage Other reaction(s): liver demage Other reaction(s): liver demage Other reaction(s): liver demage Other reaction(s): liver demage   Morphine Hives and Rash    REACTION: rash   Triamterene Hives   Aspirin-Caffeine     Other reaction(s): liver demage Other reaction(s): liver demage Other reaction(s): liver demage   Citalopram     Other reaction(s): INCREASED ANXIETY Other reaction(s): INCREASED ANXIETY   Citalopram  Hydrobromide     Other reaction(s): INCREASED ANXIETY   Dyazide [Hydrochlorothiazide W-Triamterene] Hives   Empagliflozin     Other reaction(s): stomach ache, diarrhea Other reaction(s): stomach ache, diarrhea Other reaction(s): stomach ache, diarrhea   Emtricitabine-Tenofovir Df     Other reaction(s): rash Other reaction(s): rash Other reaction(s): rash   Lisinopril     Cough    Metformin Hcl Er     Other reaction(s): upset stomach Other reaction(s): upset stomach   Morphine Sulfate     Other reaction(s): shortness of breath and hives Other reaction(s): shortness of breath and hives Other reaction(s): shortness of breath and hives   Topamax [Topiramate]    Triamterene-Hctz     Other reaction(s): rash Other reaction(s): rash Other reaction(s): rash   Losartan Rash    Pt had rash, worsening dizziness, and nausea after starting losartan, which improved after stopping losartan    Medications Reviewed Today     Reviewed by [name] Medicus, RN (Registered Nurse) on 05/11/21 at [place] List Status: <None>   Medication Order Taking? Sig Documenting Provider Last Dose Status Informant  albuterol (VENTOLIN HFA) 108 (90 Base) MCG/ACT inhaler [id number] No Inhale 2 puffs into the lungs every 4 (four) hours as needed for wheezing or shortness of breath. [name] [name], [name] Poag, MD Taking Active   amLODipine (NORVASC) 10 MG tablet [id number] No TAKE 1 TABLET (10 MG TOTAL) BY MOUTH DAILY. [name] Dame, MD Taking Active   atorvastatin (LIPITOR) 10 MG tablet [id number] No TAKE HALF TABLET BY MOUTH DAILY [name] [name], Vasilios, MD Taking Active   bictegravir-emtricitabine-tenofovir AF (BIKTARVY) 50-200-25 MG TABS tablet [id number] No Take 1 tablet by mouth daily. [name] Callas, NP Taking Active   buPROPion (WELLBUTRIN XL) 300 MG 24 hr  tablet [id number] No Take 1 tablet (300 mg total) by mouth daily. [name] Dame, MD Taking Expired 05/10/21 2359   cetirizine (ZYRTEC) 10 MG tablet [id number] No  Take 2 tablets (20 mg total) by mouth 2 (two) times daily. 1 tablet daily [name] [name], [name] Poag, MD Taking Active   cholecalciferol (VITAMIN D) 25 MCG (1000 UNIT) tablet [id number] No TAKE ONE TABLET BY MOUTH DAILY [name] Dame, MD Taking Active   famotidine (PEPCID) 40 MG tablet [id number] No Take 1 tablet (40 mg total) by mouth at bedtime. Jiles Prows, MD Taking Active   Fluticasone-Umeclidin-[facility] ELLIPTA) 200-62.5-25 MCG/ACT AEPB [id number] No Inhale 2 puffs into the lungs daily. [name] [name], [name] Poag, MD Taking Active   gabapentin (NEURONTIN) 100 MG capsule [id number] No Take 1 capsule (100 mg total) by mouth 3 (three) times daily. [name] [name], [name] Poag, MD Taking Active   hydrocortisone (CORTEF) 10 MG tablet [id number] No Take 10 mg by mouth as directed. Pt takes once a day in morning. [provider] Taking Active Self           Med Note (Shyrl Obi, Lorel [place]   Fri Jan 29, 2021  9:22 AM)    hydrocortisone (CORTEF) 5 MG tablet [id number] No Take 1 tablet by mouth every morning. [provider] Taking Active   liraglutide (VICTOZA) 18 MG/3ML SOPN [id number] No Inject into the skin. [provider] Taking Active   metoprolol tartrate (LOPRESSOR) 25 MG tablet [id number] No TAKE ONE TABLET BY MOUTH TWICE A Harlow Mares, MD Taking Active   montelukast (SINGULAIR) 10 MG tablet [id number] No Take 1 tablet (10 mg total) by mouth at bedtime. Jiles Prows, MD Taking Active   NEURONTIN 100 MG capsule [id number] No Take 2 capsules (200 mg total) by mouth 2 (two) times daily. [name] [name], [name] Poag, MD Taking Active   nitroGLYCERIN (NITROSTAT) 0.4 MG SL tablet [id number] No Place 1 tablet (0.4 mg total) under the tongue every 5 (five) minutes as needed for chest pain. Isaiah Serge, NP Taking Active   nortriptyline (PAMELOR) 10 MG capsule [id number] No Take 2 capsules (20 mg total) by mouth at bedtime. Marcial Pacas, MD Taking Active   pantoprazole (PROTONIX) 40 MG tablet [id number] No TAKE ONE TABLET  BY MOUTH TWICE A Harlow Mares, MD Taking Active   potassium chloride SA (KLOR-CON) 20 MEQ tablet [id number] No Take 20 mEq by mouth daily. [provider] Taking Active   promethazine (PHENERGAN) 25 MG tablet [id number] No Take 1 tablet (25 mg total) by mouth every 6 (six) hours as needed for nausea or vomiting. [name], DO Taking Active   sodium chloride (OCEAN) 0.65 % SOLN nasal spray [id number] No PLACE 1 SPRAY INTO BOTH NOSTRILS AS NEEDED FOR CONGESTION. [name], DO Taking Active   SUMAtriptan (IMITREX) 25 MG tablet [id number] No Take 1 tablet (25 mg total) by mouth every 2 (two) hours as needed for migraine. May repeat in 2 hours if headache persists or recurs. Marcial Pacas, MD Taking Active   SYMBICORT 160-4.5 MCG/ACT inhaler [id number] No Inhale 2 puffs into the lungs 2 (two) times daily. [name] [name], [name] Poag, MD Taking Active   triamcinolone (NASACORT) 55 MCG/ACT AERO nasal inhaler [id number] No 1 spray each nostril 2 times per day Jiles Prows, MD Taking Active   VENTOLIN HFA [phone] 0090 Base) MCG/ACT inhaler [id number] No Inhale 2 puffs into the lungs every 4 (four) hours as needed for wheezing or shortness of breath. [name] [name], [name] Poag,  MD Taking Active   Zoster Vaccine Adjuvanted [facility]) injection [id number] No Inject 0.5 mLs into the muscle. [place] Callas, NP Taking Active             Patient Active Problem List   Diagnosis Date Noted   Left leg swelling 07/09/2020   Grief reaction 06/08/2020   Arm pain 02/10/2020   Dizziness on standing 12/27/2019   Generalized anxiety disorder with panic attacks 06/20/2019   Osteoarthritis of thumb, right 11/29/2018   Chronic migraine w/o aura w/o status migrainosus, not intractable 08/27/2018   Disorder of left eustachian tube 05/19/2017   Vertigo of central origin 01/23/2017   Central perforation of tympanic membrane of left ear 12/09/2016   Eustachian tube dysfunction, bilateral 06/15/2016   Ear  pain, right 06/15/2016   Spondylosis of lumbar region without myelopathy or radiculopathy 10/08/2015   BPPV (benign paroxysmal positional vertigo) 10/02/2015   Liver fibrosis ([place]) 12/04/2014   Pituitary microadenoma ([place]) 08/08/2014   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   Vitamin D deficiency 06/29/2014   Secondary adrenal insufficiency ([place]) 06/29/2014   History of tympanostomy tube placement 02/07/2014   Refusal of blood transfusions as patient is Jehovah's Witness 11/18/2013   Hepatitis C virus infection cured after antiviral drug therapy 03/16/2012   Health care maintenance 12/15/2011   Opioid dependence ([place]) 10/05/2010   HTN (hypertension) 10/05/2010   Hyperlipidemia associated with type 2 diabetes mellitus ([employer] [facility] [employer] [facility]) 10/23/2008   HIV disease ([place]) 05/09/2006   Depression 05/09/2006   Allergic rhinitis 05/09/2006   GERD 05/09/2006    Conditions to be addressed/monitored per PCP order:   chronic healthcare management needs, HIV, H/A, DM, HTN, asthma, GERD, anxiety, osteoarthritis, HLD  Care Plan : General Plan of Care (Adult)  Updates made by [name] Medicus, RN since 05/11/2021 12:00 AM     Problem: Health Promotion or Disease Self-Management (General Plan of Care)   Priority: Medium  Onset Date: 08/10/2020     Long-Range Goal: Self-Management Plan Developed   Start Date: 05/07/2020  Expected End Date: 07/13/2021  Recent Progress: On track  Priority: Medium  Note:   Current Barriers:  Chronic Disease Management support and education needs.  05/11/21  patient with left shoulder pain-thinks it is arthritis-has appointment with Dr. Marlou Sa 05/12/21.  Nurse Case Manager Clinical Goal(s):  Over the next 30 days, patient will attend all scheduled medical appointments Over the next 30 days, patient will work with CM team pharmacist to review medications.  Interventions:  Inter-disciplinary care team collaboration (see longitudinal plan of care) Evaluation of current  treatment plan and patient's adherence to plan as established by provider. Reviewed medications with patient. Collaborated with pharmacy regarding medications. Discussed plans with patient for ongoing care management follow up and provided patient with direct contact information for care management team Reviewed scheduled/upcoming provider appointments. Pharmacy referral for medication review  Patient Goals/Self-Care Activities Over the next 30 days, patient will:  -Attends all scheduled provider appointments Calls pharmacy for medication refills Calls provider office for new concerns or questions  Follow Up Plan: The Managed Medicaid care management team will reach out to the patient again over the next 30 days.  The patient has been provided with contact information for the Managed Medicaid care management team and has been advised to call with any health related questions or concerns.     Follow Up:  Patient agrees to Care Plan and Follow-up.  Plan: The Managed Medicaid care management team will reach out to the patient again  over the next 30 days. and The  Patient has been provided with contact information for the Managed Medicaid care management team and has been advised to call with any health related questions or concerns.  Date/time of next scheduled RN care management/care coordination outreach:  06/11/21 at 0900

## 2021-05-11 NOTE — Patient Instructions (Signed)
Hi Ms. Boltz, thank you for speaking with me-I hope your appointment goes well today.  Ms. Pellegrino was given information about Medicaid Managed Care team care coordination services as a part of their [place] Medicaid benefit. [name] verbally consented to engagement with the [facility] Managed Care team.   If you are experiencing a medical emergency, please call 911 or report to your local emergency department or urgent care.   If you have a non-emergency medical problem during routine business hours, please contact your provider's office and ask to speak with a nurse.   For questions related to your [facility], please call: [phone] or visit the homepage here: [email or web address]  If you would like to schedule transportation through your [facility], please call the following number at least 2 days in advance of your appointment: [phone].   Call the [place] at [phone], at any time, 24 hours a day, 7 days a week. If you are in danger or need immediate medical attention call 911.  If you would like help to quit smoking, call 1-800-QUIT-NOW 3520877893) OR Espaol: 1-855-Djelo-Ya (2-440-102-7253) o para ms informacin haga clic aqu or Text READY to 200-400 to register via text  Ms. Pryor - following are the goals we discussed in your visit today:   Goals Addressed             This Visit's Progress    Protect My Health       Timeframe:  Long-Range Goal Priority:  Medium Start Date:      05/07/20                       Expected End Date: ongoing             Follow Up Date: 06/11/21   - schedule appointment for vaccines needed due to my age or health - schedule recommended health tests (blood work, mammogram, colonoscopy, pap test) - schedule and keep appointment for annual check-up    Why is this important?    Screening tests can find diseases early when they are easier to treat.  Your doctor or nurse will talk with you about which tests are important for you.  Getting shots for common diseases like the flu and shingles will help prevent them.    05/11/21:  Patient with complaints of left shoulder pain -has scheduled an appointment with Dr. Marlou Sa 05/12/21.   The patient verbalized understanding of instructions provided today and agreed to receive a mailed copy of patient instruction and/or educational materials.  The Managed Medicaid care management team will reach out to the patient again over the next 30 days.  The  Patient has been provided with contact information for the Managed Medicaid care management team and has been advised to call with any health related questions or concerns.   Aida Raider RN, BSN [place] Management Coordinator - Managed Medicaid High Risk [phone]   Following is a copy of your plan of care:  Care Plan : General Plan of Care (Adult)  Updates made by Gayla Medicus, RN since 05/11/2021 12:00 AM     Problem: Health Promotion or Disease Self-Management (General Plan of Care)   Priority: Medium  Onset Date: 08/10/2020     Long-Range Goal: Self-Management Plan Developed   Start Date: 05/07/2020  Expected End Date: 07/13/2021  Recent Progress: On track  Priority: Medium  Note:   Current Barriers:  Chronic Disease Management support and education needs.  05/11/21  patient with left shoulder pain-thinks it is arthritis-has appointment with Dr. Marlou Sa 05/12/21.  Nurse Case Manager Clinical Goal(s):  Over the next 30 days, patient will attend all scheduled medical appointments Over the next 30 days, patient will work with CM team pharmacist to review medications.  Interventions:  Inter-disciplinary care team collaboration (see longitudinal plan of care) Evaluation of current treatment plan and patient's adherence to plan as  established by provider. Reviewed medications with patient. Collaborated with pharmacy regarding medications. Discussed plans with patient for ongoing care management follow up and provided patient with direct contact information for care management team Reviewed scheduled/upcoming provider appointments. Pharmacy referral for medication review  Patient Goals/Self-Care Activities Over the next 30 days, patient will:  -Attends all scheduled provider appointments Calls pharmacy for medication refills Calls provider office for new concerns or questions  Follow Up Plan: The Managed Medicaid care management team will reach out to the patient again over the next 30 days.  The patient has been provided with contact information for the Managed Medicaid care management team and has been advised to call with any health related questions or concerns.

## 2021-05-12 ENCOUNTER — Other Ambulatory Visit: Payer: Self-pay | Admitting: Student

## 2021-05-12 ENCOUNTER — Ambulatory Visit (INDEPENDENT_AMBULATORY_CARE_PROVIDER_SITE_OTHER): Payer: Medicaid Other | Admitting: Orthopedic Surgery

## 2021-05-12 ENCOUNTER — Ambulatory Visit (INDEPENDENT_AMBULATORY_CARE_PROVIDER_SITE_OTHER): Payer: Medicaid Other

## 2021-05-12 ENCOUNTER — Other Ambulatory Visit: Payer: Self-pay

## 2021-05-12 ENCOUNTER — Other Ambulatory Visit: Payer: Self-pay | Admitting: Allergy and Immunology

## 2021-05-12 ENCOUNTER — Encounter: Payer: Self-pay | Admitting: Orthopedic Surgery

## 2021-05-12 DIAGNOSIS — M79602 Pain in left arm: Secondary | ICD-10-CM | POA: Diagnosis not present

## 2021-05-12 DIAGNOSIS — F32A Depression, unspecified: Secondary | ICD-10-CM

## 2021-05-12 NOTE — Addendum Note (Signed)
Addended byLaurann Montana on: 05/12/2021 10:07 AM   Modules accepted: Orders

## 2021-05-12 NOTE — Progress Notes (Signed)
Office Visit Note   Patient: [name] [name]           Date of Birth: 1961/01/11           MRN: [id number] Visit Date: 05/12/2021 Requested by: [name], MD [place],  [place] [address] PCP: [name], MD  Subjective: Chief Complaint  Patient presents with   Left Shoulder - Pain    HPI: [name] [name] is a 61 year old patient with over 1 year history of left shoulder pain.  Denies any history of injury.  Reports pain weakness and decreased range of motion.  The pain wakes her from sleep at night.  Has a prior arthroscopic intervention in the shoulder over 15 years ago.  Reports constant ache.  There is a radiation that goes down to the mid forearm region with some neck pain.  Tried ibuprofen Tylenol and Aleve without relief.  She feels like something is catching in the shoulder.  Hard for her to put on a jacket.              ROS: All systems reviewed are negative as they relate to the chief complaint within the history of present illness.  Patient denies  fevers or chills.   Assessment & Plan: Visit Diagnoses:  1. Left arm pain     Plan: Impression is left shoulder and arm pain with definite possible mechanical problem in the shoulder.  I think she may have large rotator cuff tear with subsequent rotator cuff arthropathy.  She does report some weakness.  Passive range of motion relatively well-maintained.  This could also be radicular in nature.  Plan MRI arthrogram left shoulder due to year of symptoms with night pain and failure of conservative management.  Follow-up after that study.  Could consider working up the neck if that scan is unremarkable.  Follow-Up Instructions: Return for after MRI.   Orders:  Orders Placed This Encounter  Procedures   XR Shoulder Left   XR Cervical Spine 2 or 3 views   No orders of the defined types were placed in this encounter.     Procedures: No procedures performed   Clinical Data: No additional  findings.  Objective: Vital Signs: There were no vitals taken for this visit.  Physical Exam:   Constitutional: Patient appears well-developed HEENT:  Head: Normocephalic Eyes:EOM are normal Neck: Normal range of motion Cardiovascular: Normal rate Pulmonary/chest: Effort normal Neurologic: Patient is alert Skin: Skin is warm Psychiatric: Patient has normal mood and affect   Ortho Exam: Ortho exam demonstrates good cervical spine range of motion with flexion chin to chest extension 40 degrees and rotation 65 degrees bilaterally.  Passive range of motion of the left shoulder is 65/90/170.  She does have some coarseness with internal and external rotation of the arm at 90 degrees of abduction on the left.  Radial pulse palpable on the left-hand side.  Deltoid is functional.  Rotator cuff strength is pretty reasonable to infraspinatus supraspinatus and subscap muscle testing although that is painful for her.  Specialty Comments:  No specialty comments available.  Imaging: XR Cervical Spine 2 or 3 views  Result Date: 05/12/2021 AP lateral radiographs cervical spine reviewed.  There is some loss of lordosis.  No significant degenerative disc disease and joint space narrowing between the vertebral bodies.  Mild facet joint arthritis is present diffusely throughout the cervical spine.  No acute fracture or spondylolisthesis.  XR Shoulder Left  Result Date: 05/12/2021 AP lateral outlet radiographs left  shoulder reviewed.  There is some superior migration of the humeral head on the outlet view.  Fragmentation around the lateral aspect of the acromion is present.  No significant glenohumeral joint arthritis.  AC joint spacing maintained.  Visualized lung fields clear.  Shoulder is located.  No acute fracture.    PMFS History: Patient Active Problem List   Diagnosis Date Noted   Left leg swelling 07/09/2020   Grief reaction 06/08/2020   Arm pain 02/10/2020   Dizziness on standing  12/27/2019   Generalized anxiety disorder with panic attacks 06/20/2019   Osteoarthritis of thumb, right 11/29/2018   Chronic migraine w/o aura w/o status migrainosus, not intractable 08/27/2018   Disorder of left eustachian tube 05/19/2017   Vertigo of central origin 01/23/2017   Central perforation of tympanic membrane of left ear 12/09/2016   Eustachian tube dysfunction, bilateral 06/15/2016   Ear pain, right 06/15/2016   Spondylosis of lumbar region without myelopathy or radiculopathy 10/08/2015   BPPV (benign paroxysmal positional vertigo) 10/02/2015   Liver fibrosis ([place]) 12/04/2014   Pituitary microadenoma ([place]) 08/08/2014   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   Vitamin D deficiency 06/29/2014   Secondary adrenal insufficiency ([place]) 06/29/2014   History of tympanostomy tube placement 02/07/2014   Refusal of blood transfusions as patient is Jehovah's Witness 11/18/2013   Hepatitis C virus infection cured after antiviral drug therapy 03/16/2012   Health care maintenance 12/15/2011   Opioid dependence ([place]) 10/05/2010   HTN (hypertension) 10/05/2010   Hyperlipidemia associated with type 2 diabetes mellitus ([place]) 10/23/2008   HIV disease ([place]) 05/09/2006   Depression 05/09/2006   Allergic rhinitis 05/09/2006   GERD 05/09/2006   Past Medical History:  Diagnosis Date   Allergic rhinitis 05/09/2006   Allergy    Anxiety    Arthritis    Asthma    CHF (congestive heart failure) (HCC)    Chronic back pain    Diabetes mellitus without complication (HCC)    GERD (gastroesophageal reflux disease)    Heart murmur    as a child   Hepatitis C    genotype 1b, 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 s/p 12 week course of simeprevir and sofosbuvir between October 2014 and January 2015 with resolution.   History of shingles    HIV infection ([place])    1994   Hyperlipidemia    no meds taken now   Hypertension    Migraine    Pituitary microadenoma ([place]) 08/08/2014    Prediabetes    Refusal of blood transfusions as patient is Jehovah's Witness    Secondary adrenal insufficiency ([place]) 06/29/2014   Urticaria     Family History  Problem Relation Age of Onset   Heart disease Mother    Diabetes Mother    Stroke Mother    Heart disease Father    Stroke Father    Diabetes Father    Hepatitis Sister        hcv   Asthma Sister    Allergic rhinitis Sister    Stroke Other    Colon polyps Brother    Renal Disease Brother    Cancer Sister        lung   Asthma Sister    Allergic rhinitis Sister    Cancer Maternal Aunt    Cancer Maternal Aunt    Colon cancer Neg Hx    Esophageal cancer Neg Hx    Stomach cancer Neg Hx    Rectal cancer Neg Hx  Angioedema Neg Hx    Eczema Neg Hx    Urticaria Neg Hx     Past Surgical History:  Procedure Laterality Date   BUNIONECTOMY     b/l   COLECTOMY     2003 for diverticulitis, had colostomy bag and then reversed   COLONOSCOPY     HAND SURGERY     NASAL SINUS SURGERY     SHOULDER SURGERY     left   TONSILLECTOMY     Social History   Occupational History   Occupation: Disabled  Tobacco Use   Smoking status: Former    Years: 20.00    Types: Cigarettes    Quit date: 04/26/2007    Years since quitting: 14.0   Smokeless tobacco: Never   Tobacco comments:    QUIT 2009  Vaping Use   Vaping Use: Never used  Substance and Sexual Activity   Alcohol use: No    Alcohol/week: 0.0 standard drinks   Drug use: No   Sexual activity: Not Currently    Partners: Male    Birth control/protection: Condom    Comment: declined condoms

## 2021-05-14 ENCOUNTER — Ambulatory Visit: Payer: Self-pay

## 2021-05-14 ENCOUNTER — Telehealth: Payer: Self-pay | Admitting: Orthopedic Surgery

## 2021-05-14 NOTE — Telephone Encounter (Signed)
Tried to call pt an set appt for MRI with Dr. Marlou Sa after 1/30. Pt phone was unclear and was unable to set appt

## 2021-05-17 ENCOUNTER — Telehealth: Payer: Self-pay | Admitting: Orthopedic Surgery

## 2021-05-17 NOTE — Telephone Encounter (Signed)
Called pt 2X and left vm for pt to call and set an MRI review appt with Dr. Marlou Sa after 05/24/21

## 2021-05-18 ENCOUNTER — Other Ambulatory Visit (HOSPITAL_COMMUNITY): Payer: Self-pay

## 2021-05-19 ENCOUNTER — Other Ambulatory Visit: Payer: Medicaid Other

## 2021-05-19 ENCOUNTER — Other Ambulatory Visit: Payer: Self-pay

## 2021-05-19 DIAGNOSIS — B20 Human immunodeficiency virus [HIV] disease: Secondary | ICD-10-CM

## 2021-05-22 LAB — HIV-1 RNA QUANT-NO REFLEX-BLD
HIV 1 RNA Quant: <20 Copies/mL — ABNORMAL HIGH
HIV-1 RNA Quant, Log: <1.3 Log cps/mL — ABNORMAL HIGH

## 2021-05-24 ENCOUNTER — Ambulatory Visit
Admission: RE | Admit: 2021-05-24 | Discharge: 2021-05-24 | Disposition: A | Discharge status: Home / Self Care | Payer: Medicaid Other | Source: Ambulatory Visit | Attending: Orthopedic Surgery | Admitting: Orthopedic Surgery

## 2021-05-24 ENCOUNTER — Other Ambulatory Visit: Payer: Self-pay

## 2021-05-24 DIAGNOSIS — M79602 Pain in left arm: Secondary | ICD-10-CM

## 2021-05-24 DIAGNOSIS — M25512 Pain in left shoulder: Secondary | ICD-10-CM | POA: Diagnosis not present

## 2021-05-24 MED ORDER — IOPAMIDOL (ISOVUE-M 200) INJECTION 41%
12.0000 mL | Freq: Once | INTRAMUSCULAR | Status: AC
  Administered 2021-05-24: 12 mL via INTRA_ARTICULAR

## 2021-05-25 ENCOUNTER — Other Ambulatory Visit (HOSPITAL_COMMUNITY): Payer: Self-pay

## 2021-05-26 ENCOUNTER — Encounter: Payer: Self-pay | Admitting: Orthopedic Surgery

## 2021-05-26 ENCOUNTER — Ambulatory Visit (INDEPENDENT_AMBULATORY_CARE_PROVIDER_SITE_OTHER): Payer: Medicaid Other | Admitting: Orthopedic Surgery

## 2021-05-26 ENCOUNTER — Other Ambulatory Visit: Payer: Self-pay

## 2021-05-26 DIAGNOSIS — M7552 Bursitis of left shoulder: Secondary | ICD-10-CM

## 2021-05-27 ENCOUNTER — Encounter: Payer: Self-pay | Admitting: Orthopedic Surgery

## 2021-05-27 ENCOUNTER — Other Ambulatory Visit (HOSPITAL_COMMUNITY): Payer: Self-pay

## 2021-05-27 DIAGNOSIS — M7552 Bursitis of left shoulder: Secondary | ICD-10-CM

## 2021-05-27 MED ORDER — BUPIVACAINE HCL 0.5 % IJ SOLN
9.0000 mL | INTRAMUSCULAR | Status: AC | PRN
  Administered 2021-05-27: 9 mL via INTRA_ARTICULAR

## 2021-05-27 MED ORDER — LIDOCAINE HCL 1 % IJ SOLN
5.0000 mL | INTRAMUSCULAR | Status: AC | PRN
  Administered 2021-05-27: 5 mL

## 2021-05-27 MED ORDER — METHYLPREDNISOLONE ACETATE 40 MG/ML IJ SUSP
40.0000 mg | INTRAMUSCULAR | Status: AC | PRN
  Administered 2021-05-27: 40 mg via INTRA_ARTICULAR

## 2021-05-27 NOTE — Progress Notes (Signed)
Office Visit Note   Patient: [name] [name]           Date of Birth: 1961/02/09           MRN: [id number] Visit Date: 05/26/2021 Requested by: [name], MD [place],  [place] [id number] PCP: [name], MD  Subjective: Chief Complaint  Patient presents with   Left Shoulder - Follow-up    HPI: [name] [name] is a 61 year old patient with left shoulder pain.  Patient describes having what sounds like arthroscopic surgery done about 10 years ago.  MRI scan is reviewed and it shows some calcification within the posterior aspect of the supraspinatus tendon footprint.  There is also additional chronic calcific tendinosis at the superior subscap tendon insertion.  Postsurgical changes of distal clavicle excision and acromioplasty also noted.  Overall no full-thickness rotator cuff tear.  She had an injection done several years ago which did not help much.  Overhead motion is worse for her.              ROS: All systems reviewed are negative as they relate to the chief complaint within the history of present illness.  Patient denies  fevers or chills.   Assessment & Plan: Visit Diagnoses:  1. Bursitis of left shoulder     Plan: Impression is recurrent shoulder pain following left shoulder arthroscopy and distal clavicle excision done 10 years ago.  MRI scan shows 2 foci of calcific tendinitis and no full-thickness rotator cuff tear.  Pain is becoming somewhat debilitating.  Does not seem today on exam or history that it is related to the neck but that always a consideration.  Plan today is subacromial injection with 6-week return with decision for or against operative intervention at that time.  Operative intervention would be subacromial decompression and decompression of these calcific deposits.  Could also evaluate the biceps tendon for possible pathologic findings.  Follow-Up Instructions: Return in about 6 weeks (around 07/07/2021).   Orders:  No orders of the defined types  were placed in this encounter.  No orders of the defined types were placed in this encounter.     Procedures: Large Joint Inj: L subacromial bursa on 05/27/2021 7:22 AM Indications: diagnostic evaluation and pain Details: 18 G 1.5 in needle, posterior approach  Arthrogram: No  Medications: 9 mL bupivacaine 0.5 %; 40 mg methylPREDNISolone acetate 40 MG/ML; 5 mL lidocaine 1 % Outcome: tolerated well, no immediate complications Procedure, treatment alternatives, risks and benefits explained, specific risks discussed. Consent was given by the patient. Immediately prior to procedure a time out was called to verify the correct patient, procedure, equipment, support staff and site/side marked as required. Patient was prepped and draped in the usual sterile fashion.      Clinical Data: No additional findings.  Objective: Vital Signs: There were no vitals taken for this visit.  Physical Exam:   Constitutional: Patient appears well-developed HEENT:  Head: Normocephalic Eyes:EOM are normal Neck: Normal range of motion Cardiovascular: Normal rate Pulmonary/chest: Effort normal Neurologic: Patient is alert Skin: Skin is warm Psychiatric: Patient has normal mood and affect   Ortho Exam: Ortho exam demonstrates good cervical spine range of motion with no paresthesias on the left-hand side.  No discrete AC joint tenderness is present.  Patient does have some deltoid tenderness with positive impingement signs.  Rotator cuff strength is good infraspinatus supraspinous and subscap muscle testing.  No restriction of passive external rotation of 15 degrees of abduction bilaterally.  No  masses lymphadenopathy or skin changes noted in that shoulder girdle region.  Specialty Comments:  No specialty comments available.  Imaging: No results found.   PMFS History: Patient Active Problem List   Diagnosis Date Noted   Left leg swelling 07/09/2020   Grief reaction 06/08/2020   Arm pain  02/10/2020   Dizziness on standing 12/27/2019   Generalized anxiety disorder with panic attacks 06/20/2019   Osteoarthritis of thumb, right 11/29/2018   Chronic migraine w/o aura w/o status migrainosus, not intractable 08/27/2018   Disorder of left eustachian tube 05/19/2017   Vertigo of central origin 01/23/2017   Central perforation of tympanic membrane of left ear 12/09/2016   Eustachian tube dysfunction, bilateral 06/15/2016   Ear pain, right 06/15/2016   Spondylosis of lumbar region without myelopathy or radiculopathy 10/08/2015   BPPV (benign paroxysmal positional vertigo) 10/02/2015   Liver fibrosis ([place]) 12/04/2014   Pituitary microadenoma ([place]) 08/08/2014   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   Vitamin D deficiency 06/29/2014   Secondary adrenal insufficiency ([place]) 06/29/2014   History of tympanostomy tube placement 02/07/2014   Refusal of blood transfusions as patient is Jehovah's Witness 11/18/2013   Hepatitis C virus infection cured after antiviral drug therapy 03/16/2012   Health care maintenance 12/15/2011   Opioid dependence ([place]) 10/05/2010   HTN (hypertension) 10/05/2010   Hyperlipidemia associated with type 2 diabetes mellitus ([place]) 10/23/2008   HIV disease ([place]) 05/09/2006   Depression 05/09/2006   Allergic rhinitis 05/09/2006   GERD 05/09/2006   Past Medical History:  Diagnosis Date   Allergic rhinitis 05/09/2006   Allergy    Anxiety    Arthritis    Asthma    CHF (congestive heart failure) (HCC)    Chronic back pain    Diabetes mellitus without complication (HCC)    GERD (gastroesophageal reflux disease)    Heart murmur    as a child   Hepatitis C    genotype 1b, 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 s/p 12 week course of simeprevir and sofosbuvir between October 2014 and January 2015 with resolution.   History of shingles    HIV infection ([place])    1994   Hyperlipidemia    no meds taken now   Hypertension    Migraine     Pituitary microadenoma ([place]) 08/08/2014   Prediabetes    Refusal of blood transfusions as patient is Jehovah's Witness    Secondary adrenal insufficiency ([place]) 06/29/2014   Urticaria     Family History  Problem Relation Age of Onset   Heart disease Mother    Diabetes Mother    Stroke Mother    Heart disease Father    Stroke Father    Diabetes Father    Hepatitis Sister        hcv   Asthma Sister    Allergic rhinitis Sister    Stroke Other    Colon polyps Brother    Renal Disease Brother    Cancer Sister        lung   Asthma Sister    Allergic rhinitis Sister    Cancer Maternal Aunt    Cancer Maternal Aunt    Colon cancer Neg Hx    Esophageal cancer Neg Hx    Stomach cancer Neg Hx    Rectal cancer Neg Hx    Angioedema Neg Hx    Eczema Neg Hx    Urticaria Neg Hx     Past Surgical History:  Procedure Laterality Date  BUNIONECTOMY     b/l   COLECTOMY     2003 for diverticulitis, had colostomy bag and then reversed   COLONOSCOPY     HAND SURGERY     NASAL SINUS SURGERY     SHOULDER SURGERY     left   TONSILLECTOMY     Social History   Occupational History   Occupation: Disabled  Tobacco Use   Smoking status: Former    Years: 20.00    Types: Cigarettes    Quit date: 04/26/2007    Years since quitting: 14.0   Smokeless tobacco: Never   Tobacco comments:    QUIT 2009  Vaping Use   Vaping Use: Never used  Substance and Sexual Activity   Alcohol use: No    Alcohol/week: 0.0 standard drinks   Drug use: No   Sexual activity: Not Currently    Partners: Male    Birth control/protection: Condom    Comment: declined condoms

## 2021-05-29 ENCOUNTER — Other Ambulatory Visit: Payer: Self-pay | Admitting: Internal Medicine

## 2021-05-29 DIAGNOSIS — I10 Essential (primary) hypertension: Secondary | ICD-10-CM

## 2021-06-08 ENCOUNTER — Encounter: Payer: Self-pay | Admitting: *Deleted

## 2021-06-09 ENCOUNTER — Other Ambulatory Visit: Payer: Self-pay | Admitting: Student

## 2021-06-09 DIAGNOSIS — Z1231 Encounter for screening mammogram for malignant neoplasm of breast: Secondary | ICD-10-CM

## 2021-06-11 ENCOUNTER — Other Ambulatory Visit: Payer: Self-pay

## 2021-06-11 ENCOUNTER — Other Ambulatory Visit: Payer: Self-pay | Admitting: Obstetrics and Gynecology

## 2021-06-11 NOTE — Patient Outreach (Signed)
Medicaid Managed Care   Nurse Care Manager Note  06/11/2021 Name:  [name] [name] MRN:  [id number] DOB:  [birth year]  [name] [name] is an 61 y.o. year old female who is a primary patient of [name] Dame, [name].  The [facility] Managed Care Coordination team was consulted for assistance with:    Chronic healthcare management needs, HTN, H/A, DM, asthma, GERD, anxiety, osteoarthritis  [name] was given information about Medicaid Managed Care Coordination team services today. [name] [name] Patient agreed to services and verbal consent obtained.  Engaged with patient by telephone for follow up visit in response to provider referral for case management and/or care coordination services.   Assessments/Interventions:  Review of past medical history, allergies, medications, health status, including review of consultants reports, laboratory and other test data, was performed as part of comprehensive evaluation and provision of chronic care management services.  SDOH (Social Determinants of Health) assessments and interventions performed: SDOH Interventions    Flowsheet Row Most Recent Value  SDOH Interventions   Financial Strain Interventions Intervention Not Indicated  Depression Interventions/Treatment  --  [name] [name] therapy]      Care Plan  Allergies  Allergen Reactions   Acetaminophen Other (See Comments)    Inflamed liver, hospitalized REACTION: Had liver problems, was hospitalized Inflamed liver, hospitalized Other reaction(s): liver demage Other reaction(s): liver demage Other reaction(s): liver demage Other reaction(s): liver demage Other reaction(s): liver demage Other reaction(s): liver demage Other reaction(s): liver demage   Morphine Hives and Rash    REACTION: rash   Triamterene Hives   Aspirin-Caffeine     Other reaction(s): liver demage Other reaction(s): liver demage Other reaction(s): liver demage   Citalopram     Other reaction(s): INCREASED ANXIETY Other  reaction(s): INCREASED ANXIETY   Citalopram Hydrobromide     Other reaction(s): INCREASED ANXIETY   Dyazide [Hydrochlorothiazide W-Triamterene] Hives   Empagliflozin     Other reaction(s): stomach ache, diarrhea Other reaction(s): stomach ache, diarrhea Other reaction(s): stomach ache, diarrhea   Emtricitabine-Tenofovir Df     Other reaction(s): rash Other reaction(s): rash Other reaction(s): rash   Lisinopril     Cough    Metformin Hcl Er     Other reaction(s): upset stomach Other reaction(s): upset stomach   Morphine Sulfate     Other reaction(s): shortness of breath and hives Other reaction(s): shortness of breath and hives Other reaction(s): shortness of breath and hives   Topamax [Topiramate]    Triamterene-Hctz     Other reaction(s): rash Other reaction(s): rash Other reaction(s): rash   Losartan Rash    Pt had rash, worsening dizziness, and nausea after starting losartan, which improved after stopping losartan   Medications Reviewed Today     Reviewed by [name] Medicus, RN (Registered Nurse) on 06/11/21 at [phone]  Med List Status: <None>   Medication Order Taking? Sig Documenting Provider Last Dose Status Informant  albuterol (VENTOLIN HFA) 108 (90 Base) MCG/ACT inhaler [id number] No Inhale 2 puffs into the lungs every 4 (four) hours as needed for wheezing or shortness of breath. [name] [name] Taking Active   amLODipine (NORVASC) 10 MG tablet [id number] No TAKE 1 TABLET (10 MG TOTAL) BY MOUTH DAILY. [name] Dame, [name] Taking Active   atorvastatin (LIPITOR) 10 MG tablet [id number] No TAKE HALF TABLET BY MOUTH DAILY [name] [name], Vasilios, [name] Taking Active   bictegravir-emtricitabine-tenofovir AF (BIKTARVY) 50-200-25 MG TABS tablet [id number] No Take 1 tablet by mouth daily. [name] Callas, [name] Taking Active   buPROPion [facility]  XL) 300 MG 24 hr tablet [id number]  TAKE ONE TABLET BY MOUTH DAILY [name] Dame, [name]  Active   cetirizine (ZYRTEC) 10 MG tablet  [id number] No Take 2 tablets (20 mg total) by mouth 2 (two) times daily. 1 tablet daily [name] [name] Taking Active   cholecalciferol (VITAMIN D) 25 MCG (1000 UNIT) tablet [id number] No TAKE ONE TABLET BY MOUTH DAILY [name] Dame, [name] Taking Active   famotidine (PEPCID) 40 MG tablet [id number] No Take 1 tablet (40 mg total) by mouth at bedtime. [name] [name] Taking Active   Fluticasone-Umeclidin-Vilant [facility] ELLIPTA) 200-62.5-25 MCG/ACT AEPB [id number] No Inhale 2 puffs into the lungs daily. [name] [name] Taking Active   gabapentin (NEURONTIN) 100 MG capsule [id number] No Take 1 capsule (100 mg total) by mouth 3 (three) times daily. [name] [name] Taking Active   hydrocortisone (CORTEF) 10 MG tablet [id number] No Take 10 mg by mouth as directed. Pt takes once a day in morning. Provider, Historical, [name] Taking Active [name] [name]           Med Note ([name] [name]   Fri Jan 29, 2021  9:22 AM)    hydrocortisone (CORTEF) 5 MG tablet [id number] No Take 1 tablet by mouth every morning. Provider, Historical, [name] Taking Active   liraglutide (VICTOZA) 18 MG/3ML SOPN [id number] No Inject into the skin. Provider, Historical, [name] Taking Active   metoprolol tartrate (LOPRESSOR) 25 MG tablet [id number]  TAKE ONE TABLET BY MOUTH TWICE A DAY [name] Dame, [name]  Active   montelukast (SINGULAIR) 10 MG tablet [id number] No Take 1 tablet (10 mg total) by mouth at bedtime. [name] [name] Taking Active   NEURONTIN 100 MG capsule [id number]  Take 2 capsules (200 mg total) by mouth 3 (three) times daily. [name] [name]  Active   nitroGLYCERIN (NITROSTAT) 0.4 MG SL tablet [id number] No Place 1 tablet (0.4 mg total) under the tongue every 5 (five) minutes as needed for chest pain. [name] [name] Taking Active   nortriptyline (PAMELOR) 10 MG capsule [id number] No Take 2 capsules (20 mg total) by mouth at bedtime. [name] Pacas, [name] Taking Active   pantoprazole (PROTONIX) 40 MG tablet [id number] No TAKE ONE TABLET  BY MOUTH TWICE A [name] [name] Taking Active   potassium chloride SA (KLOR-CON) 20 MEQ tablet [id number] No Take 20 mEq by mouth daily. Provider, Historical, [name] Taking Active   promethazine (PHENERGAN) 25 MG tablet [id number] No Take 1 tablet (25 mg total) by mouth every 6 (six) hours as needed for nausea or vomiting. [name], DO Taking Active   sodium chloride (OCEAN) 0.65 % SOLN nasal spray [id number] No PLACE 1 SPRAY INTO BOTH NOSTRILS AS NEEDED FOR CONGESTION. [name], DO Taking Active   SUMAtriptan (IMITREX) 25 MG tablet [id number] No Take 1 tablet (25 mg total) by mouth every 2 (two) hours as needed for migraine. May repeat in 2 hours if headache persists or recurs. [name] Pacas, [name] Taking Active   SYMBICORT 160-4.5 MCG/ACT inhaler [id number] No Inhale 2 puffs into the lungs 2 (two) times daily. [name] [name] Taking Active   triamcinolone (NASACORT) 55 MCG/ACT AERO nasal inhaler [id number] No 1 spray each nostril 2 times per day [name] [name] Taking Active   VENTOLIN HFA 108 [phone] Base) MCG/ACT inhaler [id number] No Inhale 2 puffs into the lungs every 4 (four) hours as needed for wheezing or shortness of breath. [name], [name] Poag,  [name] Taking Active   Zoster Vaccine Adjuvanted [facility]) injection [id number] No Inject 0.5 mLs into the muscle. [place] Callas, [name] Taking Active            Patient Active Problem List   Diagnosis Date Noted   Left leg swelling 07/09/2020   Grief reaction 06/08/2020   Arm pain 02/10/2020   Dizziness on standing 12/27/2019   Generalized anxiety disorder with panic attacks 06/20/2019   Osteoarthritis of thumb, right 11/29/2018   Chronic migraine w/o aura w/o status migrainosus, not intractable 08/27/2018   Disorder of left eustachian tube 05/19/2017   Vertigo of central origin 01/23/2017   Central perforation of tympanic membrane of left ear 12/09/2016   Eustachian tube dysfunction, bilateral 06/15/2016   Ear pain,  right 06/15/2016   Spondylosis of lumbar region without myelopathy or radiculopathy 10/08/2015   BPPV (benign paroxysmal positional vertigo) 10/02/2015   Liver fibrosis ([place]) 12/04/2014   Pituitary microadenoma ([place]) 08/08/2014   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   Vitamin D deficiency 06/29/2014   Secondary adrenal insufficiency ([place]) 06/29/2014   History of tympanostomy tube placement 02/07/2014   Refusal of blood transfusions as patient is Jehovah's Witness 11/18/2013   Hepatitis C virus infection cured after antiviral drug therapy 03/16/2012   Health care maintenance 12/15/2011   Opioid dependence ([place]) 10/05/2010   HTN (hypertension) 10/05/2010   Hyperlipidemia associated with type 2 diabetes mellitus ([place]) 10/23/2008   HIV disease ([place]) 05/09/2006   Depression 05/09/2006   Allergic rhinitis 05/09/2006   GERD 05/09/2006   Conditions to be addressed/monitored per PCP order:  Chronic healthcare management needs, HTN, H/A, DM, asthma, GERD, anxiety, osteoarthritis, depression, HLD, vertigo  Care Plan : General Plan of Care (Adult)  Updates made by [name] Medicus, RN since 06/11/2021 12:00 AM     Problem: Health Promotion or Disease [name] [name]-Management (General Plan of Care)   Priority: Medium  Onset Date: 08/10/2020     Long-Range Goal: [name] [name]-Management Plan Developed   Start Date: 05/07/2020  Expected End Date: 07/13/2021  Recent Progress: On track  Priority: Medium  Note:   Current Barriers:  Chronic Disease Management support and education needs.  06/11/21:  patient states shoulder pain improved following injections, having some vertigo today  Some increased depression today as it is close to 1 year anniversary of brother's death-grief counseling as needed with Hospice-currently on medication-states she will see PCP regarding increased dosing.  Nurse Case Manager Clinical Goal(s):  Over the next 30 days, patient will attend all scheduled medical  appointments Over the next 30 days, patient will work with CM team pharmacist to review medications.  Interventions:  Inter-disciplinary care team collaboration (see longitudinal plan of care) Evaluation of current treatment plan and patient's adherence to plan as established by provider. Reviewed medications with patient. Collaborated with pharmacy regarding medications. Discussed plans with patient for ongoing care management follow up and provided patient with direct contact information for care management team Reviewed scheduled/upcoming provider appointments. Pharmacy referral for medication review  Patient Goals/[name] [name]-Care Activities Over the next 30 days, patient will:  -Attends all scheduled provider appointments Calls pharmacy for medication refills Calls provider office for new concerns or questions  Follow Up Plan: The Managed Medicaid care management team will reach out to the patient again over the next 30 days.  The patient has been provided with contact information for the Managed Medicaid care management team and has been advised to call with any health related questions or concerns.  Follow Up:  Patient agrees to Care Plan and Follow-up.  Plan: The Managed Medicaid care management team will reach out to the patient again over the next 30 days. and The  Patient has been provided with contact information for the Managed Medicaid care management team and has been advised to call with any health related questions or concerns.  Date/time of next scheduled RN care management/care coordination outreach:  07/09/21 at 0900.

## 2021-06-11 NOTE — Patient Instructions (Signed)
Hi [name]-it was a pleasure speaking with you today-I hope your back feels better!  [name] was given information about Medicaid Managed Care team care coordination services as a part of their [place] Medicaid benefit. [name] [name] verbally consented to engagement with the [facility] Managed Care team.   If you are experiencing a medical emergency, please call 911 or report to your local emergency department or urgent care.   If you have a non-emergency medical problem during routine business hours, please contact your provider's office and ask to speak with a nurse.   For questions related to your [facility], please call: [phone] or visit the homepage here: [email or web address]  If you would like to schedule transportation through your [facility], please call the following number at least 2 days in advance of your appointment: [phone].  Rides for urgent appointments can also be made after hours by calling Member Services.  Call the [place] at [phone], at any time, 24 hours a day, 7 days a week. If you are in danger or need immediate medical attention call 911.  If you would like help to quit smoking, call 1-800-QUIT-NOW 774-626-1774) OR Espaol: 1-855-Djelo-Ya (4-315-400-8676) o para ms informacin haga clic aqu or Text READY to 200-400 to register via text  [name] [name] - following are the goals we discussed in your visit today:   Goals Addressed             This Visit's Progress    Protect My Health       Timeframe:  Long-Range Goal Priority:  Medium Start Date:      05/07/20                       Expected End Date: ongoing             Follow Up Date: 07/09/21   - schedule appointment for vaccines needed due to my age or health - schedule recommended health tests (blood work, mammogram, colonoscopy, pap  test) - schedule and keep appointment for annual check-up    Why is this important?   Screening tests can find diseases early when they are easier to treat.  Your doctor or nurse will talk with you about which tests are important for you.  Getting shots for common diseases like the flu and shingles will help prevent them.    06/11/21:  patient with improved shoulder pain-has appt with PCP 06/30/21 and follow up with Dr. Erlinda Hong in March as well.   Patient verbalizes understanding of instructions and care plan provided today and agrees to view in [place]. Active MyChart status confirmed with patient.    The Managed Medicaid care management team will reach out to the patient again over the next 30 days.  The  Patient  has been provided with contact information for the Managed Medicaid care management team and has been advised to call with any health related questions or concerns.   [name] Raider RN, BSN [place] Management Coordinator - Managed Medicaid High Risk [phone]   Following is a copy of your plan of care:  Care Plan : General Plan of Care (Adult)  Updates made by Gayla Medicus, RN since 06/11/2021 12:00 AM     Problem: Health Promotion or Disease Self-Management (General Plan of Care)   Priority: Medium  Onset Date: 08/10/2020     [facility]  Goal: Self-Management Plan Developed   Start Date: 05/07/2020  Expected End Date: 07/13/2021  Recent Progress: On track  Priority: Medium  Note:   Current Barriers:  Chronic Disease Management support and education needs.  06/11/21:  patient states shoulder pain improved following injections, some vertigo today. Some increased depression today as it is close to 1 year anniversary of brother's death-grief counseling as needed with Hospice-currently on medication-states she will see PCP regarding increased dosing.  Nurse Case Manager Clinical Goal(s):  Over the next 30 days, patient will attend all scheduled  medical appointments Over the next 30 days, patient will work with CM team pharmacist to review medications.  Interventions:  Inter-disciplinary care team collaboration (see longitudinal plan of care) Evaluation of current treatment plan and patient's adherence to plan as established by provider. Reviewed medications with patient. Collaborated with pharmacy regarding medications. Discussed plans with patient for ongoing care management follow up and provided patient with direct contact information for care management team Reviewed scheduled/upcoming provider appointments. Pharmacy referral for medication review  Patient Goals/Self-Care Activities Over the next 30 days, patient will:  -Attends all scheduled provider appointments Calls pharmacy for medication refills Calls provider office for new concerns or questions  Follow Up Plan: The Managed Medicaid care management team will reach out to the patient again over the next 30 days.  The patient has been provided with contact information for the Managed Medicaid care management team and has been advised to call with any health related questions or concerns.

## 2021-06-18 ENCOUNTER — Ambulatory Visit: Payer: Self-pay

## 2021-06-21 ENCOUNTER — Other Ambulatory Visit: Payer: Self-pay

## 2021-06-21 ENCOUNTER — Other Ambulatory Visit: Payer: Medicaid Other | Admitting: Obstetrics and Gynecology

## 2021-06-21 NOTE — Patient Outreach (Signed)
Care Coordination  06/21/2021  [name] [name] Oct 22, 1960 [id number]  RNCM returned patient's phone call regarding appointment with Managed Medicaid Pharmacist.  Patient notified appointment to be rescheduled with Managed Medicaid Pharmacist as soon as possible.  Aida Raider RN, BSN [place]   [facility] - Managed Medicaid High Risk [phone]

## 2021-06-30 ENCOUNTER — Ambulatory Visit: Payer: Medicaid Other | Admitting: Student

## 2021-06-30 VITALS — BP 142/89 | HR 98 | Temp 97.8°F | Wt 184.2 lb

## 2021-06-30 DIAGNOSIS — F4321 Adjustment disorder with depressed mood: Secondary | ICD-10-CM

## 2021-06-30 DIAGNOSIS — E099 Drug or chemical induced diabetes mellitus without complications: Secondary | ICD-10-CM

## 2021-06-30 DIAGNOSIS — Z0000 Encounter for general adult medical examination without abnormal findings: Secondary | ICD-10-CM

## 2021-06-30 DIAGNOSIS — E119 Type 2 diabetes mellitus without complications: Secondary | ICD-10-CM

## 2021-06-30 DIAGNOSIS — B20 Human immunodeficiency virus [HIV] disease: Secondary | ICD-10-CM

## 2021-06-30 DIAGNOSIS — I10 Essential (primary) hypertension: Secondary | ICD-10-CM

## 2021-06-30 DIAGNOSIS — T380X5D Adverse effect of glucocorticoids and synthetic analogues, subsequent encounter: Secondary | ICD-10-CM

## 2021-06-30 DIAGNOSIS — K219 Gastro-esophageal reflux disease without esophagitis: Secondary | ICD-10-CM

## 2021-06-30 DIAGNOSIS — E2749 Other adrenocortical insufficiency: Secondary | ICD-10-CM

## 2021-06-30 DIAGNOSIS — M7542 Impingement syndrome of left shoulder: Secondary | ICD-10-CM

## 2021-06-30 LAB — POCT GLYCOSYLATED HEMOGLOBIN (HGB A1C): Hemoglobin A1C: 6.5 % — AB (ref 4.0–5.6)

## 2021-06-30 LAB — GLUCOSE, CAPILLARY: Glucose-Capillary: 128 mg/dL — ABNORMAL HIGH (ref 70–99)

## 2021-06-30 MED ORDER — METOPROLOL TARTRATE 25 MG PO TABS: 25.0000 mg | ORAL_TABLET | Freq: Two times a day (BID) | ORAL | 4 refills | Status: DC

## 2021-06-30 MED ORDER — PANTOPRAZOLE SODIUM 40 MG PO TBEC: 40.0000 mg | DELAYED_RELEASE_TABLET | Freq: Two times a day (BID) | ORAL | 1 refills | Status: DC

## 2021-06-30 MED ORDER — NAPROXEN 250 MG PO TABS: 250.0000 mg | ORAL_TABLET | Freq: Two times a day (BID) | ORAL | 0 refills | Status: AC

## 2021-06-30 MED ORDER — AMLODIPINE BESYLATE 10 MG PO TABS: 10.0000 mg | ORAL_TABLET | Freq: Every day | ORAL | 4 refills | Status: DC

## 2021-06-30 NOTE — Patient Instructions (Signed)
Ms.Madalin L Lupo, it was a pleasure seeing you today! ? ?Today we discussed: ?- Please make sure to follow-up with infectious disease and your endocrinologist. ? ?- I will prescribe a few days of naproxen to help with the inflammation in your shoulder. Only use this medication when the shoulder is very bothersome or when you are more active. ? ?- I have sent in refills to the pharmacy as well. ? ?I have ordered the following labs today: ? ? ?Lab Orders    ?     Microalbumin / Creatinine Urine Ratio    ?     POC Hbg A1C     ? ? ?Follow-up: 4-6 months  ? ?Please make sure to arrive 15 minutes prior to your next appointment. If you arrive late, you may be asked to reschedule.  ? ?We look forward to seeing you next time. Please call our clinic at [phone] if you have any questions or concerns. The best time to call is Monday-Friday from 9am-4pm, but there is someone available 24/7. If after hours or the weekend, call the main hospital number and ask for the Internal Medicine Resident On-Call. If you need medication refills, please notify your pharmacy one week in advance and they will send [place] a request. ? ?Thank you for letting us take part in your care. Wishing you the best! ? ?Thank you, ?[name], MD ?

## 2021-07-01 DIAGNOSIS — M7542 Impingement syndrome of left shoulder: Secondary | ICD-10-CM | POA: Insufficient documentation

## 2021-07-01 LAB — MICROALBUMIN / CREATININE URINE RATIO
Creatinine, Urine: 226.4 mg/dL
Microalb/Creat Ratio: 9 mg/g creat (ref 0–29)
Microalbumin, Urine: 20.5 ug/mL

## 2021-07-01 NOTE — Assessment & Plan Note (Addendum)
Patient states her endocrinologist regularly manages her diabetes. She reports compliance with Victoza, denies polyuria/polydipsia. Will obtain an A1c and urine microalbumin/creatinine ratio today and have her follow-up with her endocrinologist.  ? ?- A1c today ?- Urine microalbumin/creatinine ratio ?- Follow-up with endocrinologist ?

## 2021-07-01 NOTE — Assessment & Plan Note (Signed)
This is a chronic issue for [name] [name] that is well-controlled. She is followed by endocrinology regularly for both secondary adrenal insufficiency and type II diabetes. Patient to continue Cortef and follow-up with endocrinology. ? ?- Continue hydrocortisone per endocrinology ?- Follow-up with endocrinology ?

## 2021-07-01 NOTE — Assessment & Plan Note (Signed)
Ms. Panchal discussed that she continues to see a grief counselor after the death of her brother last year. She reports that she has good support around her and has not felt as though her grief has limited her daily activities. She currently denies SI/HI. We will continue to monitor. ?

## 2021-07-01 NOTE — Assessment & Plan Note (Signed)
Patient is due for PAP smear. She reports she will get this during her visit with ID next month. ?

## 2021-07-01 NOTE — Assessment & Plan Note (Signed)
This is a chronic medical issue for [name] [name]. She regularly follows up with ID and has an appointment with them next month. She reports compliance with Biktarvy. Will hold off lab work today given ID appointment & lab work due next month. ? ?- Continue anti-retroviral therapy per ID ?- Follow-up with ID next month ?

## 2021-07-01 NOTE — Progress Notes (Signed)
? ?  CC: regular follow-up ? ?HPI: ? ?[name] [name] is a 61 y.o. person with medical history as below presenting to [facility] for regular follow-up. ? ?Please see problem-based list for further details, assessments, and plans. ? ?Past Medical History:  ?Diagnosis Date  ? Allergic rhinitis 05/09/2006  ? Allergy   ? Anxiety   ? Arthritis   ? Asthma   ? CHF (congestive heart failure) ([place])   ? Chronic back pain   ? Diabetes mellitus without complication ([place])   ? GERD (gastroesophageal reflux disease)   ? Heart murmur   ? as a child  ? Hepatitis C   ? genotype 1b, 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 s/p 12 week course of simeprevir and sofosbuvir between October 2014 and January 2015 with resolution.  ? History of shingles   ? HIV infection ([place])   ? 1994  ? Hyperlipidemia   ? no meds taken now  ? Hypertension   ? Migraine   ? Pituitary microadenoma ([place]) 08/08/2014  ? Prediabetes   ? Refusal of blood transfusions as patient is Jehovah's Witness   ? Secondary adrenal insufficiency ([place]) 06/29/2014  ? Urticaria   ? ?Review of Systems:  As per HPI ? ?Physical Exam: ? ?Vitals:  ? 06/30/21 1024  ?BP: (!) 142/89  ?Pulse: 98  ?Temp: 97.8 ?F (36.6 ?C)  ?TempSrc: Oral  ?SpO2: 98%  ?Weight: 184 lb 3.2 oz (83.6 kg)  ? ?General: Resting comfortably in no acute distress ?CV: Regular rate, rhythm. No murmurs appreciated. Distal pulses 2+ bilaterally. ?Pulm: Normal respiratory effort on room air. Clear to ausculation bilaterally. ?MSK: Normal bulk, tone. No pitting edema bilateral lower extremities. L shoulder without bony abnormalities on palpation. Limited passive and active abduction of L arm to 90 degrees. Hawkins positive. ?Skin: Warm, dry. No  ?Neuro: Awake, alert, conversing appropriately.  ?Psych: Normal mood, affect, speech.  ? ?Assessment & Plan:  ? ?See Encounters Tab for problem based charting. ? ?Patient discussed with Dr. Evette Doffing ? ?

## 2021-07-01 NOTE — Assessment & Plan Note (Addendum)
Patient reports recent left shoulder pain that has limited some of her daily activities for the last few weeks. She denies any known inciting event, numbness, paresthesias, neuropathy, weakness in the left arm. Specifically mentions she cannot raise her left arm above her shoulder due to the pain. Notes she has been using topical agents, including OTC voltaren gel and lidocaine patches. She says these usually help with the pain, but would like something else until she can see her orthopedic next week. ? ?Discussed with Ms. Locken that we will prescribe short course of NSAID's as needed for severe pain, but will need to avoid these long term. Patient verbalized understanding.  ? ?- Naproxen '250mg'$  twice daily as needed #8 tablets ?- Follow-up with orthopedics next week ?

## 2021-07-01 NOTE — Assessment & Plan Note (Signed)
BP Readings from Last 3 Encounters:  ?06/30/21 (!) 142/89  ?05/04/21 121/79  ?03/02/21 130/88  ? ?Blood pressure mildly elevated than previous, but BP has been controlled at home. She has ben tolerating her medications well without issues. Denies chest pain, dyspnea, lightheadedness.  ? ?- Metoprolol tartrate '25mg'$  twice daily ?- Amlodipine '10mg'$  daily ?

## 2021-07-02 ENCOUNTER — Telehealth: Payer: Self-pay

## 2021-07-02 ENCOUNTER — Other Ambulatory Visit: Payer: Self-pay | Admitting: Internal Medicine

## 2021-07-02 NOTE — Telephone Encounter (Signed)
Vitamin D '1000mg'$  ?[place]  ?[place], [place] [address]  ?

## 2021-07-02 NOTE — Progress Notes (Signed)
Internal Medicine Clinic Attending ? ?Case discussed with Dr. Braswell  At the time of the visit.  We reviewed the resident?s history and exam and pertinent patient test results.  I agree with the assessment, diagnosis, and plan of care documented in the resident?s note.  ?

## 2021-07-07 ENCOUNTER — Ambulatory Visit (INDEPENDENT_AMBULATORY_CARE_PROVIDER_SITE_OTHER): Payer: Medicaid Other | Admitting: Orthopedic Surgery

## 2021-07-07 ENCOUNTER — Other Ambulatory Visit: Payer: Self-pay

## 2021-07-07 ENCOUNTER — Encounter: Payer: Self-pay | Admitting: Orthopaedic Surgery

## 2021-07-07 DIAGNOSIS — M7552 Bursitis of left shoulder: Secondary | ICD-10-CM | POA: Diagnosis not present

## 2021-07-07 NOTE — Progress Notes (Signed)
? ?Office Visit Note ?  ?Patient: [name] [name]           ?Date of Birth: [birth year]           ?MRN: [id number] ?Visit Date: 07/07/2021 ?Requested by: [name], MD ?[address] ?[place],  [place] [address] ?PCP: [name], MD ? ?Subjective: ?Chief Complaint  ?Patient presents with  ? Left Shoulder - Pain  ? ? ?HPI: [name] [name] is a 61 year old patient with left shoulder pain.  She did have an injection which helped her for about 1 and half weeks and now it is "back to hurting".  Using it hurts.  She does do mopping and sweeping and that hurts her to use that left shoulder for that.  Has a history of prior biceps tenodesis and distal clavicle excision.  MRI scan does show calcific tendinitis as well as adequate decompression of that AC joint.  No definite rotator cuff tear full-thickness but there is some attritional tearing which could also be at play.  She localizes her pain primarily anteriorly.  Takes Naprosyn for symptoms. ?             ?ROS: All systems reviewed are negative as they relate to the chief complaint within the history of present illness.  Patient denies  fevers or chills. ? ? ?Assessment & Plan: ?Visit Diagnoses:  ?1. Bursitis of left shoulder   ? ? ?Plan: Impression is calcific tendinitis in that left shoulder which I think could be giving her some if not most of her symptoms.  There is some attritional tearing but without a definite full-thickness component on MRI scanning.  Shoulder does not appear to be frozen.  She has mild degenerative changes as well in the glenohumeral joint.  Had a long discussion with [name] [name] today about operative and nonoperative treatment options.  In general operative treatment would be arthroscopy with evaluation of the rotator cuff possible decompression of calcific tendinitis with subsequent rotator cuff tear repair if needed.  Risk and benefits are discussed with the patient including not limited to infection nerve vessel damage incomplete restoration of function  and incomplete pain relief.  Would use shoulder brace for motion after surgery.  All questions answered ? ?Follow-Up Instructions: No follow-ups on file.  ? ?Orders:  ?No orders of the defined types were placed in this encounter. ? ?No orders of the defined types were placed in this encounter. ? ? ? ? Procedures: ?No procedures performed ? ? ?Clinical Data: ?No additional findings. ? ?Objective: ?Vital Signs: There were no vitals taken for this visit. ? ?Physical Exam:  ? ?Constitutional: Patient appears well-developed ?HEENT:  ?Head: Normocephalic ?Eyes:EOM are normal ?Neck: Normal range of motion ?Cardiovascular: Normal rate ?Pulmonary/chest: Effort normal ?Neurologic: Patient is alert ?Skin: Skin is warm ?Psychiatric: Patient has normal mood and affect ? ? ?Ortho Exam: Ortho exam demonstrates passive range of motion of 70/95/170.  Rotator cuff strength pretty reasonable to infraspinatus supraspinatus subscap muscle testing.  A little bit of anterior crepitus with internal and external rotation at 90 degrees of abduction.  No discrete tenderness over the [facility] joint.  No Popeye deformity present. ? ?Specialty Comments:  ?No specialty comments available. ? ?Imaging: ?No results found. ? ? ?PMFS History: ?Patient Active Problem List  ? Diagnosis Date Noted  ? Impingement syndrome of left shoulder 07/01/2021  ? Left leg swelling 07/09/2020  ? Grief reaction 06/08/2020  ? Arm pain 02/10/2020  ? Dizziness on standing 12/27/2019  ? Generalized anxiety disorder with  panic attacks 06/20/2019  ? Osteoarthritis of thumb, right 11/29/2018  ? Chronic migraine w/o aura w/o status migrainosus, not intractable 08/27/2018  ? Disorder of left eustachian tube 05/19/2017  ? Vertigo of central origin 01/23/2017  ? Central perforation of tympanic membrane of left ear 12/09/2016  ? Eustachian tube dysfunction, bilateral 06/15/2016  ? Ear pain, right 06/15/2016  ? Spondylosis of lumbar region without myelopathy or radiculopathy 10/08/2015   ? BPPV (benign paroxysmal positional vertigo) 10/02/2015  ? Liver fibrosis ([place]) 12/04/2014  ? 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 Vitamin D deficiency 06/29/2014  ? Secondary adrenal insufficiency ([place]) 06/29/2014  ? History of tympanostomy tube placement 02/07/2014  ? Refusal of blood transfusions as patient is Jehovah's Witness 11/18/2013  ? Hepatitis C virus infection cured after antiviral drug therapy 03/16/2012  ? Health care maintenance 12/15/2011  ? Opioid dependence ([place]) 10/05/2010  ? HTN (hypertension) 10/05/2010  ? Hyperlipidemia associated with type 2 diabetes mellitus ([place]) 10/23/2008  ? HIV disease ([place]) 05/09/2006  ? Depression 05/09/2006  ? Allergic rhinitis 05/09/2006  ? GERD 05/09/2006  ? ?Past Medical History:  ?Diagnosis Date  ? Allergic rhinitis 05/09/2006  ? Allergy   ? Anxiety   ? Arthritis   ? Asthma   ? CHF (congestive heart failure) ([place])   ? Chronic back pain   ? Diabetes mellitus without complication ([place])   ? GERD (gastroesophageal reflux disease)   ? Heart murmur   ? as a child  ? Hepatitis C   ? genotype 1b, 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 s/p 12 week course of simeprevir and sofosbuvir between October 2014 and January 2015 with resolution.  ? History of shingles   ? HIV infection ([place])   ? 1994  ? Hyperlipidemia   ? no meds taken now  ? Hypertension   ? Migraine   ? Pituitary microadenoma ([place]) 08/08/2014  ? Prediabetes   ? Refusal of blood transfusions as patient is Jehovah's Witness   ? Secondary adrenal insufficiency ([place]) 06/29/2014  ? Urticaria   ?  ?Family History  ?Problem Relation Age of Onset  ? Heart disease Mother   ? Diabetes Mother   ? Stroke Mother   ? Heart disease Father   ? Stroke Father   ? Diabetes Father   ? Hepatitis Sister   ?     hcv  ? Asthma Sister   ? Allergic rhinitis Sister   ? Stroke Other   ? Colon polyps Brother   ? Renal Disease Brother   ? Cancer Sister   ?     lung  ? Asthma  Sister   ? Allergic rhinitis Sister   ? Cancer Maternal Aunt   ? Cancer Maternal Aunt   ? Colon cancer Neg Hx   ? Esophageal cancer Neg Hx   ? Stomach cancer Neg Hx   ? Rectal cancer Neg Hx   ? Angioedema Neg Hx   ? Eczema Neg Hx   ? Urticaria Neg Hx   ?  ?Past Surgical History:  ?Procedure Laterality Date  ? BUNIONECTOMY    ? b/l  ? COLECTOMY    ? 2003 for diverticulitis, had colostomy bag and then reversed  ? COLONOSCOPY    ? HAND SURGERY    ? NASAL SINUS SURGERY    ? SHOULDER SURGERY    ? left  ? TONSILLECTOMY    ? ?Social History  ? ?Occupational History  ?  Occupation: Disabled  ?Tobacco Use  ? Smoking status: Former  ?  Years: 20.00  ?  Types: Cigarettes  ?  Quit date: 04/26/2007  ?  Years since quitting: 14.2  ? Smokeless tobacco: Never  ? Tobacco comments:  ?  QUIT 2009  ?Vaping Use  ? Vaping Use: Never used  ?Substance and Sexual Activity  ? Alcohol use: No  ?  Alcohol/week: 0.0 standard drinks  ? Drug use: No  ? Sexual activity: Not Currently  ?  Partners: Male  ?  Birth control/protection: Condom  ?  Comment: declined condoms  ? ? ? ? ? ?

## 2021-07-09 ENCOUNTER — Telehealth: Payer: Self-pay | Admitting: Orthopedic Surgery

## 2021-07-09 ENCOUNTER — Other Ambulatory Visit: Payer: Self-pay | Admitting: Obstetrics and Gynecology

## 2021-07-09 ENCOUNTER — Other Ambulatory Visit: Payer: Self-pay

## 2021-07-09 ENCOUNTER — Other Ambulatory Visit: Payer: Self-pay | Admitting: Surgical

## 2021-07-09 MED ORDER — CYCLOBENZAPRINE HCL 10 MG PO TABS: 10.0000 mg | ORAL_TABLET | Freq: Three times a day (TID) | ORAL | 0 refills | Status: DC | PRN

## 2021-07-09 NOTE — Telephone Encounter (Signed)
IC advised submitted.  

## 2021-07-09 NOTE — Telephone Encounter (Signed)
[name] [name] states that she was seen in the clinic on Wednesday by Dr. Marlou Sa.  She is c/o of her arm hurting and needing something called in for pain.  "It doesn't have to be strong" but she cannot take advil or aleve and tylenol is not helping.  She uses [facility] on [address].  Call back # is [phone]. ?

## 2021-07-09 NOTE — Patient Outreach (Signed)
?Medicaid Managed Care   ?Nurse Care Manager Note ? ?07/09/2021 ?Name:  [name] [name] MRN:  [id number] DOB:  [birth year] ? ?[name] [name] is an 61 y.o. year old female who is a primary patient of [name], MD.  The [facility] Managed Care Coordination team was consulted for assistance with:    ?Chronic healthcare management needs, HTN, DM, asthma, GERD, anxiety/depression, osteoarthritis, HIV. ? ?Ms. Deviney was given information about Medicaid Managed Care Coordination team services today. [name] Patient agreed to services and verbal consent obtained. ? ?Engaged with patient by telephone for follow up visit in response to provider referral for case management and/or care coordination services.  ? ?Assessments/Interventions:  Review of past medical history, allergies, medications, health status, including review of consultants reports, laboratory and other test data, was performed as part of comprehensive evaluation and provision of chronic care management services. ? ?SDOH (Social Determinants of Health) assessments and interventions performed: ?SDOH Interventions   ? ?Flowsheet Row Most Recent Value  ?SDOH Interventions   ?Stress Interventions Intervention Not Indicated  ?Transportation Interventions Intervention Not Indicated  ? ?  ? ?Care Plan ? ?Allergies  ?Allergen Reactions  ? Acetaminophen Other (See Comments)  ?  Inflamed liver, hospitalized ?REACTION: Had liver problems, was hospitalized ?Inflamed liver, hospitalized ?Other reaction(s): liver demage ?Other reaction(s): liver demage ?Other reaction(s): liver demage ?Other reaction(s): liver demage ?Other reaction(s): liver demage ?Other reaction(s): liver demage ?Other reaction(s): liver demage  ? Morphine Hives and Rash  ?  REACTION: rash  ? Triamterene Hives  ? Aspirin-Caffeine   ?  Other reaction(s): liver demage ?Other reaction(s): liver demage ?Other reaction(s): liver demage  ? Citalopram   ?  Other reaction(s): INCREASED ANXIETY ?Other  reaction(s): INCREASED ANXIETY  ? Citalopram Hydrobromide   ?  Other reaction(s): INCREASED ANXIETY  ? Dyazide [Hydrochlorothiazide W-Triamterene] Hives  ? Empagliflozin   ?  Other reaction(s): stomach ache, diarrhea ?Other reaction(s): stomach ache, diarrhea ?Other reaction(s): stomach ache, diarrhea  ? Emtricitabine-Tenofovir Df   ?  Other reaction(s): rash ?Other reaction(s): rash ?Other reaction(s): rash  ? Lisinopril   ?  Cough   ? Metformin Hcl Er   ?  Other reaction(s): upset stomach ?Other reaction(s): upset stomach  ? Morphine Sulfate   ?  Other reaction(s): shortness of breath and hives ?Other reaction(s): shortness of breath and hives ?Other reaction(s): shortness of breath and hives  ? Topamax [Topiramate]   ? Triamterene-Hctz   ?  Other reaction(s): rash ?Other reaction(s): rash ?Other reaction(s): rash  ? Losartan Rash  ?  Pt had rash, worsening dizziness, and nausea after starting losartan, which improved after stopping losartan  ? ?Medications Reviewed Today   ? ? Reviewed by Gayla Medicus, RN (Registered Nurse) on 07/09/21 at [phone]  Med List Status: <None>  ? ?Medication Order Taking? Sig Documenting Provider Last Dose Status Informant  ?albuterol (VENTOLIN HFA) 108 (90 Base) MCG/ACT inhaler [id number] No Inhale 2 puffs into the lungs every 4 (four) hours as needed for wheezing or shortness of breath. Kozlow, Donnamarie Poag, MD Taking Active   ?amLODipine (NORVASC) 10 MG tablet [id number]  Take 1 tablet (10 mg total) by mouth daily. [name], MD  Active   ?atorvastatin (LIPITOR) 10 MG tablet [id number] No TAKE HALF TABLET BY MOUTH DAILY Katsadouros, Vasilios, MD Taking Active   ?bictegravir-emtricitabine-tenofovir AF (BIKTARVY) 50-200-25 MG TABS tablet [id number] No Take 1 tablet by mouth daily. [place] Callas, NP Taking Active   ?buPROPion (WELLBUTRIN XL) 300 MG  24 hr tablet [id number]  TAKE ONE TABLET BY MOUTH DAILY [name], MD  Active   ?cetirizine (ZYRTEC) 10 MG tablet [id number] No  Take 2 tablets (20 mg total) by mouth 2 (two) times daily. 1 tablet daily Kozlow, Donnamarie Poag, MD Taking Active   ?cholecalciferol (VITAMIN D) 25 MCG (1000 UNIT) tablet [id number]  TAKE ONE TABLET BY MOUTH DAILY [name], MD  Active   ?famotidine (PEPCID) 40 MG tablet [id number] No Take 1 tablet (40 mg total) by mouth at bedtime. Kozlow, Donnamarie Poag, MD Taking Active   ?Fluticasone-Umeclidin-Vilant (TRELEGY ELLIPTA) 200-62.5-25 MCG/ACT AEPB [id number] No Inhale 2 puffs into the lungs daily. Kozlow, Donnamarie Poag, MD Taking Active   ?gabapentin (NEURONTIN) 100 MG capsule [id number] No Take 1 capsule (100 mg total) by mouth 3 (three) times daily. Kozlow, Donnamarie Poag, MD Taking Active   ?hydrocortisone (CORTEF) 10 MG tablet [id number] No Take 10 mg by mouth as directed. Pt takes once a day in morning. [provider] Taking Active Self  ?         ?Med Note (Selassie Spatafore, Lorel Monaco   Fri Jan 29, 2021  9:22 AM)    ?hydrocortisone (CORTEF) 5 MG tablet [id number] No Take 1 tablet by mouth every morning. [provider] Taking Active   ?liraglutide (VICTOZA) 18 MG/3ML SOPN [id number] No Inject into the skin. [provider] Taking Active   ?metoprolol tartrate (LOPRESSOR) 25 MG tablet [id number]  Take 1 tablet (25 mg total) by mouth 2 (two) times daily. [name], MD  Active   ?montelukast (SINGULAIR) 10 MG tablet [id number] No Take 1 tablet (10 mg total) by mouth at bedtime. Kozlow, Donnamarie Poag, MD Taking Active   ?NEURONTIN 100 MG capsule [id number]  Take 2 capsules (200 mg total) by mouth 3 (three) times daily. Kozlow, Donnamarie Poag, MD  Active   ?nitroGLYCERIN (NITROSTAT) 0.4 MG SL tablet [id number] No Place 1 tablet (0.4 mg total) under the tongue every 5 (five) minutes as needed for chest pain. Isaiah Serge, NP Taking Active   ?nortriptyline (PAMELOR) 10 MG capsule [id number] No Take 2 capsules (20 mg total) by mouth at bedtime. Marcial Pacas, MD Taking Active   ?pantoprazole (PROTONIX) 40 MG tablet [id number]  Take 1 tablet (40  mg total) by mouth 2 (two) times daily. [name], MD  Active   ?potassium chloride SA (KLOR-CON) 20 MEQ tablet [id number] No Take 20 mEq by mouth daily. [provider] Taking Active   ?promethazine (PHENERGAN) 25 MG tablet [id number] No Take 1 tablet (25 mg total) by mouth every 6 (six) hours as needed for nausea or vomiting. [name], DO Taking Active   ?sodium chloride (OCEAN) 0.65 % SOLN nasal spray [id number] No PLACE 1 SPRAY INTO BOTH NOSTRILS AS NEEDED FOR CONGESTION. [name], DO Taking Active   ?SUMAtriptan (IMITREX) 25 MG tablet [id number] No Take 1 tablet (25 mg total) by mouth every 2 (two) hours as needed for migraine. May repeat in 2 hours if headache persists or recurs. Marcial Pacas, MD Taking Active   ?SYMBICORT 160-4.5 MCG/ACT inhaler [id number] No Inhale 2 puffs into the lungs 2 (two) times daily. Kozlow, Donnamarie Poag, MD Taking Active   ?triamcinolone (NASACORT) 55 MCG/ACT AERO nasal inhaler [id number] No 1 spray each nostril 2 times per day Jiles Prows, MD Taking Active   ?VENTOLIN HFA 108 (90 Base) MCG/ACT inhaler [id number] No Inhale 2 puffs into the lungs every 4 (four) hours as needed for wheezing or shortness  of breath. Kozlow, Donnamarie Poag, MD Taking Active   ?Zoster Vaccine Adjuvanted [facility]) injection [id number] No Inject 0.5 mLs into the muscle. [place] Callas, NP Taking Active   ? ?  ?  ? ?  ? ?Patient Active Problem List  ? Diagnosis Date Noted  ? Impingement syndrome of left shoulder 07/01/2021  ? Left leg swelling 07/09/2020  ? Grief reaction 06/08/2020  ? Arm pain 02/10/2020  ? Dizziness on standing 12/27/2019  ? 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 Osteoarthritis of thumb, right 11/29/2018  ? Chronic migraine w/o aura w/o status migrainosus, not intractable 08/27/2018  ? Disorder of left eustachian tube 05/19/2017  ? Vertigo of central origin 01/23/2017  ? Central perforation of tympanic membrane of left ear 12/09/2016   ? Eustachian tube dysfunction, bilateral 06/15/2016  ? Ear pain, right 06/15/2016  ? 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 BPPV (benign paroxysmal positional vertigo)

## 2021-07-09 NOTE — Telephone Encounter (Signed)
Sent in RX for Flexeril to see if this will help. Do not operative car while taking med.  If no improvement, could try tramadol after this

## 2021-07-09 NOTE — Patient Instructions (Signed)
Hi [name] [name], thanks for speaking with me-I hope you feel better! ? ?[name] [name] was given information about Medicaid Managed Care team care coordination services as a part of their [place] Medicaid benefit. [name] [name] verbally consented to engagement with the [facility] Managed Care team.  ? ?If you are experiencing a medical emergency, please call 911 or report to your local emergency department or urgent care.  ? ?If you have a non-emergency medical problem during routine business hours, please contact your provider's office and ask to speak with a nurse.  ? ?For questions related to your [facility], please call: [phone] or visit the homepage here: [email or web address] ? ?If you would like to schedule transportation through your [facility], please call the following number at least 2 days in advance of your appointment: [phone]. ? Rides for urgent appointments can also be made after hours by calling Member Services. ? ?Call the [place] at [phone], at any time, 24 hours a day, 7 days a week. If you are in danger or need immediate medical attention call 911. ? ?If you would like help to quit smoking, call 1-800-QUIT-NOW 641-078-2628) OR Espa?ol: 1-855-D?jelo-Ya (803)513-8504) o para m?s informaci?n haga clic aqu? or Text READY to 200-400 to register via text ? ?[name] [name] - following are the goals we discussed in your visit today:  ? Goals Addressed   ? ?  ?  ?  ?  ? This Visit's Progress  ?  Protect My Health     ?  Timeframe:  Long-Range Goal ?Priority:  Medium ?Start Date:      05/07/20                       ?Expected End Date: ongoing            ? ?Follow Up Date: 08/09/21 ?  ?- schedule appointment for vaccines needed due to my age or health ?- schedule recommended health tests (blood work, mammogram, colonoscopy, pap test) ?- schedule and  keep appointment for annual check-up  ?  ?Why is this important?   ?Screening tests can find diseases early when they are easier to treat.  ?Your doctor or nurse will talk with you about which tests are important for you.  ?Getting shots for common diseases like the flu and shingles will help prevent them.   ?  ?07/09/21:  Patient with continued shoulder pain-to have surgery scheduled -Dr. Marlou Sa  ? ?Patient verbalizes understanding of instructions and care plan provided today and agrees to view in [place]. Active MyChart status confirmed with patient.   ? ?The Managed Medicaid care management team will reach out to the patient again over the next 30 days.  ?The  Patient  has been provided with contact information for the Managed Medicaid care management team and has been advised to call with any health related questions or concerns.  ? ?Aida Raider RN, BSN ?[facility] ?Care Management Coordinator - Managed Medicaid High Risk ?[phone] ? ?Following is a copy of your plan of care:  ?Care Plan : General Plan of Care (Adult)  ?Updates made by Gayla Medicus, RN since 07/09/2021 12:00 AM  ?  ? ?Problem: Health Promotion or Disease Self-Management (General Plan of Care)   ?Priority: Medium  ?Onset Date: 08/10/2020  ?  ? ?Long-Range Goal: Self-Management Plan Developed   ?Start Date: 05/07/2020  ?Expected End Date:  10/09/2021  ?Recent Progress: On track  ?Priority: Medium  ?Note:   ?Current Barriers:  ?Chronic Disease Management support and education needs.  ?07/09/21:  Patient with continued left shoulder pain-being followed by Dr. Loistine Simas have shoulder surgery.  Patient maintained on current dose of Wellbutrin for depression.  No further complaints today. ? ?Nurse Case Manager Clinical Goal(s):  ?Over the next 30 days, patient will attend all scheduled medical appointments ?Over the next 30 days, patient will work with CM team pharmacist to review medications. ? ?Interventions:   ?Inter-disciplinary care team collaboration (see longitudinal plan of care) ?Evaluation of current treatment plan and patient's adherence to plan as established by provider. ?Reviewed medications with patient. ?Collaborated with pharmacy regarding medications. ?Discussed plans with patient for ongoing care management follow up and provided patient with direct contact information for care management team ?Reviewed scheduled/upcoming provider appointments. ?Pharmacy referral for medication review ? ?Patient Goals/Self-Care Activities ?Over the next 30 days, patient will: ? -Attends all scheduled provider appointments ?Calls pharmacy for medication refills ?Calls provider office for new concerns or questions ? ?Follow Up Plan: The Managed Medicaid care management team will reach out to the patient again over the next 30 days.  ?The patient has been provided with contact information for the Managed Medicaid care management team and has been advised to call with any health related questions or concerns.   ?  ?

## 2021-07-13 ENCOUNTER — Telehealth: Payer: Self-pay | Admitting: Pharmacist

## 2021-07-13 ENCOUNTER — Encounter: Payer: Self-pay | Admitting: Pharmacist

## 2021-07-13 NOTE — Patient Instructions (Signed)
It was great to speak with you today! ? ?Our goal blood pressure is less than 130/80. Continue to check your blood pressures periodically, write down these readings, and continue your current regimen.  ? ?Take care! ? ?Catie Darnelle Maffucci, PharmD ? ?Visit Information ? ?[name] was given information about Medicaid Managed Care team care coordination services as a part of their [place] Medicaid benefit. [name] verbally consented to engagement with the [facility] Managed Care team.  ? ?If you are experiencing a medical emergency, please call 911 or report to your local emergency department or urgent care.  ? ?If you have a non-emergency medical problem during routine business hours, please contact your provider's office and ask to speak with a nurse.  ? ?For questions related to your [facility], please call: [phone] or visit the homepage here: [email or web address] ? ?If you would like to schedule transportation through your [facility], please call the following number at least 2 days in advance of your appointment: [phone]. ? Rides for urgent appointments can also be made after hours by calling Member Services. ? ? ? ?Catie Darnelle Maffucci, PharmD, BCACP, CPP ?Clinical Pharmacist ?Therapist, music at [employer] ?[phone] ? ?

## 2021-07-13 NOTE — Patient Outreach (Signed)
Care Coordination ? ?07/13/2021 ? ?[place] ?June 25, 1960 ?[id number] ? ?Received referral from RN CM for pharmacist to outreach the patient to discuss medication questions. Called patient, left voicemail for her to return my call at her convenience.  ? ?Catie Darnelle Maffucci, PharmD, BCACP ?[place] ?[phone] ? ?

## 2021-07-13 NOTE — Patient Outreach (Signed)
? ?   ? ?Chief Complaint  ?Patient presents with  ? High Risk Managed Medicaid  ? ? ?[name] [name] is a 61 y.o. year old female who was referred for medication management by their primary care provider, [name], MD. They presented for a telephone visit in the context of the COVID-19 pandemic. ?  ?They were referred to the pharmacist by their High Risk Managed Medicaid Care Team  for assistance in managing hypertension.  ? ?Subjective: ?Hypertension: ? ?Current medications: amlodipine 10 mg daily, metoprolol tartrate 25 mg twice daily ? ?Current blood pressure readings readings: reports home readings <130/80 ? ?Patient denies hypotensive s/sx including dizziness, lightheadedness. Patient denies hypertensive symptoms including headache, chest pain, shortness of breath ? ?Does report elevations in BP when in pain.  ? ? ?Objective: ?Lab Results  ?Component Value Date  ? HGBA1C 6.5 (A) 06/30/2021  ? ? ?Lab Results  ?Component Value Date  ? CREATININE 0.89 02/02/2021  ? BUN 14 02/02/2021  ? NA 138 02/02/2021  ? K 3.7 02/02/2021  ? CL 104 02/02/2021  ? CO2 27 02/02/2021  ? ? ?Lab Results  ?Component Value Date  ? CHOL 169 02/02/2021  ? HDL 45 (L) 02/02/2021  ? [place] 91 02/02/2021  ? TRIG 238 (H) 02/02/2021  ? CHOLHDL 3.8 02/02/2021  ? ? ?Medications Reviewed Today   ? ? Reviewed by De Hollingshead, RPH-CPP (Pharmacist) on 07/13/21 at 1604  Med List Status: <None>  ? ?Medication Order Taking? Sig Documenting Provider Last Dose Status Informant  ?albuterol (VENTOLIN HFA) 108 (90 Base) MCG/ACT inhaler [id number] Yes Inhale 2 puffs into the lungs every 4 (four) hours as needed for wheezing or shortness of breath. Kozlow, Donnamarie Poag, MD Taking Active   ?amLODipine (NORVASC) 10 MG tablet [id number] Yes Take 1 tablet (10 mg total) by mouth daily. [name], MD Taking Active   ?atorvastatin (LIPITOR) 10 MG tablet [id number] Yes TAKE HALF TABLET BY MOUTH DAILY Katsadouros, Vasilios, MD Taking Active    ?bictegravir-emtricitabine-tenofovir AF (BIKTARVY) 50-200-25 MG TABS tablet [id number] Yes Take 1 tablet by mouth daily. [place] Callas, NP Taking Active   ?buPROPion (WELLBUTRIN XL) 300 MG 24 hr tablet [id number] Yes TAKE ONE TABLET BY MOUTH DAILY [name], MD Taking Active   ?cetirizine (ZYRTEC) 10 MG tablet [id number] Yes Take 2 tablets (20 mg total) by mouth 2 (two) times daily. 1 tablet daily Kozlow, Donnamarie Poag, MD Taking Active   ?cholecalciferol (VITAMIN D) 25 MCG (1000 UNIT) tablet [id number]  TAKE ONE TABLET BY MOUTH DAILY [name], MD  Active   ?cyclobenzaprine (FLEXERIL) 10 MG tablet [id number] Yes Take 1 tablet (10 mg total) by mouth 3 (three) times daily as needed for muscle spasms. Magnant, Gerrianne Scale, PA-C Taking Active   ?famotidine (PEPCID) 40 MG tablet [id number] Yes Take 1 tablet (40 mg total) by mouth at bedtime. Kozlow, Donnamarie Poag, MD Taking Active   ?gabapentin (NEURONTIN) 100 MG capsule [id number] Yes Take 1 capsule (100 mg total) by mouth 3 (three) times daily. Kozlow, Donnamarie Poag, MD Taking Active   ?hydrocortisone (CORTEF) 5 MG tablet [id number] Yes Take 1 tablet by mouth every morning. [provider] Taking Active   ?liraglutide (VICTOZA) 18 MG/3ML SOPN [id number] Yes Inject 0.6 mg into the skin. [provider] Taking Active   ?metoprolol tartrate (LOPRESSOR) 25 MG tablet [id number] Yes Take 1 tablet (25 mg total) by mouth 2 (two) times daily. [name], MD Taking Active   ?montelukast (SINGULAIR) 10 MG tablet  [id number] Yes Take 1 tablet (10 mg total) by mouth at bedtime. Kozlow, Donnamarie Poag, MD Taking Active   ?nitroGLYCERIN (NITROSTAT) 0.4 MG SL tablet [id number]  Place 1 tablet (0.4 mg total) under the tongue every 5 (five) minutes as needed for chest pain. Isaiah Serge, NP  Active   ?nortriptyline (PAMELOR) 10 MG capsule [id number] Yes Take 2 capsules (20 mg total) by mouth at bedtime. Marcial Pacas, MD Taking Active   ?pantoprazole (PROTONIX) 40 MG tablet [id number]  Yes Take 1 tablet (40 mg total) by mouth 2 (two) times daily. [name], MD Taking Active   ?potassium chloride SA (KLOR-CON) 20 MEQ tablet [id number] Yes Take 20 mEq by mouth daily. [provider] Taking Active   ?promethazine (PHENERGAN) 25 MG tablet [id number] No Take 1 tablet (25 mg total) by mouth every 6 (six) hours as needed for nausea or vomiting.  ?Patient not taking: Reported on 07/13/2021  ? [name], DO Not Taking Active   ?sodium chloride (OCEAN) 0.65 % SOLN nasal spray [id number] Yes PLACE 1 SPRAY INTO BOTH NOSTRILS AS NEEDED FOR CONGESTION. [name], DO Taking Active   ?SUMAtriptan (IMITREX) 25 MG tablet [id number] Yes Take 1 tablet (25 mg total) by mouth every 2 (two) hours as needed for migraine. May repeat in 2 hours if headache persists or recurs. Marcial Pacas, MD Taking Active   ?SYMBICORT 160-4.5 MCG/ACT inhaler [id number] Yes Inhale 2 puffs into the lungs 2 (two) times daily. Kozlow, Donnamarie Poag, MD Taking Active   ?triamcinolone (NASACORT) 55 MCG/ACT AERO nasal inhaler [id number] Yes 1 spray each nostril 2 times per day Jiles Prows, MD Taking Active   ?  Discontinued 07/13/21 1604 (Completed Course)   ? ?  ?  ? ?  ? ? ?Assessment/Plan:  ?Care Plan : Medication Management  ?Updates made by De Hollingshead, RPH-CPP since 07/13/2021 12:00 AM  ?Completed 07/13/2021  ? ?Problem: Hypertension Resolved 07/13/2021  ?  ? ?Long-Range Goal: BP <140/90 Completed 07/13/2021  ?Start Date: 07/13/2021  ?This Visit's Progress: On track  ?Note:   ?Current Barriers:  ?Unable to achieve control of hypertension  ? ?Patient Needs: ?none ? ?Patient Activities: ?Patient will:  ?- take medications as prescribed as evidenced by patient report and record review ?check blood pressure daily, document, and provide at future appointments ? ?  ? ? ?Hypertension: ?- Currently controlled ?- Reviewed long term cardiovascular and renal outcomes of uncontrolled blood pressure ?- Reviewed  appropriate blood pressure monitoring technique and reviewed goal blood pressure. Recommended to check home blood pressure and heart rate daily ?- Recommend to continue current regimen at this time  ? ?Follow Up Plan: none needed at this time with pharmacy ? ?Catie Darnelle Maffucci, PharmD, BCACP ?[facility] ? ? ? ?

## 2021-07-22 ENCOUNTER — Encounter (HOSPITAL_COMMUNITY): Payer: Self-pay | Admitting: Orthopedic Surgery

## 2021-07-22 ENCOUNTER — Telehealth: Payer: Self-pay | Admitting: Orthopedic Surgery

## 2021-07-22 ENCOUNTER — Other Ambulatory Visit: Payer: Self-pay

## 2021-07-22 NOTE — Telephone Encounter (Signed)
Patient's left shoulder surgery has not yet been authorized. Surgery scheduled for 07-26-21 will be postponed.  Patient would like to know if something stronger than Flexeril can be prescribed until her surgery date.  She states the pain is worse at night and keeps her awake.  Patient uses [employer].  ?

## 2021-07-22 NOTE — Pre-Procedure Instructions (Addendum)
SDW CALL ? ?Patient was given pre-op instructions over the phone. The opportunity was given for the patient to ask questions. No further questions asked. Patient verbalized understanding of instructions given. ? ? ?PCP - Hadassah Pais ?Endocrine- [facility]- last office note requested ?Cardiologist - Jenkins Rouge ?Infectious Disease- Bobby Rumpf ?Allergy/Asthma- Dr Neldon Mc ? ?PPM/ICD - denies ?Chest x-ray - 07/19/21 ?EKG - last EKG- 07/17/20- will need EKG DOS ?Stress Test - 07/27/20 ?ECHO - 08/18/20 ?Cardiac Cath - denies ? ?Sleep Study - denies ? ?Fasting Blood Sugar - <200 per patient at home- pt checks CBG weekly and if symptomatic. 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 Buddy Duty. Pt last Hgb A1c 6.5 on 06/30/21 ? ?ERAS Protcol - ERAS per protocol- no orders entered ? ?COVID TEST- N/A ? ?Pt is a [employer]- requesting no blood products ? ?Anesthesia review: yes, review health history- cardiac tests, hx CHF, HTN, DM, HIV, Asthma- pt denies needing medical clearance prior to surgery ? ?Patient denies shortness of breath, fever, cough and chest pain over the phone call- pt states she has been feeling well, no illness or sickness currently.  ? ? ? ?Surgical Instructions ? ? ? Your procedure is scheduled on Monday, April 3 ? Report to [facility] Main Entrance "A" at 7:15 A.M., then check in with the Admitting office. ? Call this number if you have problems the morning of surgery: ? [phone] ? ? ? Remember: ? Do not eat after midnight the night before your surgery ? ?You may drink clear liquids until 6:45AM the morning of your surgery.   ?Clear liquids allowed are: Water, Non-Citrus Juices (without pulp), Carbonated Beverages, Clear Tea, Black Coffee ONLY (NO MILK, CREAM OR POWDERED CREAMER of any kind), and Gatorade ? ? Take these medicines the morning of surgery with A SIP OF WATER:  ? ?amLODipine (NORVASC)  ?metoprolol tartrate (LOPRESSOR) ?pantoprazole  (PROTONIX)  ?bictegravir-emtricitabine-tenofovir AF (BIKTARVY)  ?cetirizine (ZYRTEC)  ?gabapentin (NEURONTIN)  ?hydrocortisone (CORTEF)  ?SYMBICORT 160-4.5 MCG/ACT inhaler ?triamcinolone (NASACORT) 55 MCG/ACT AERO nasal inhaler ?Nasal spray if needed ?Eye drops if needed ?cyclobenzaprine (FLEXERIL) if needed ?promethazine (PHENERGAN) if eneded ?albuterol (VENTOLIN HFA) 108 (90 Base) MCG/ACT inhaler if needed ? ?Please bring all inhalers with you the day of surgery.  ? ? ?The day of surgery, do not take other diabetes injectables, including Byetta (exenatide), Bydureon (exenatide ER), Victoza (liraglutide), or Trulicity (dulaglutide). ? ? ?Check your blood sugar the morning of your surgery when you wake up and every 2 hours until you get to the Short Stay unit. ? ?If your blood sugar is less than 70 mg/dL, you will need to treat for low blood sugar: ?Do not take insulin. ?Treat a low blood sugar (less than 70 mg/dL) with ? cup of clear juice (cranberry or apple), 4 glucose tablets, OR glucose gel. ?Recheck blood sugar in 15 minutes after treatment (to make sure it is greater than 70 mg/dL). If your blood sugar is not greater than 70 mg/dL on recheck, call [phone] for further instructions. ? ?As of today, STOP taking any Aspirin (unless otherwise instructed by your surgeon) Aleve, Naproxen, Ibuprofen, Motrin, Advil, Goody's, BC's, all herbal medications, fish oil, and all vitamins. ? ?[facility] is not responsible for any belongings or valuables.  ?  ?Contacts, glasses, hearing aids, dentures or partials may not be worn into surgery, please bring cases for these belongings ?  ?Patients discharged the day of surgery will not be  allowed to drive home, and someone needs to stay with them for 24 hours. ? ? ?SURGICAL WAITING ROOM VISITATION ?No visitors are allowed in pre-op area with patient.  ?Patients having surgery or a procedure in a hospital may have two support people in the waiting room. ?Children under the  age of 29 must have an adult with them who is not the patient. ?They may stay in the waiting area during the procedure and may switch out with other visitors. If the patient needs to stay at the hospital during part of their recovery, the visitor guidelines for inpatient rooms apply. ? ?Please refer to the [facility] website for the visitor guidelines for Inpatients (after your surgery is over and you are in a regular room).  ? ? ? ?Special instructions:   ? ?Oral Hygiene is also important to reduce your risk of infection.  Remember - BRUSH YOUR TEETH THE MORNING OF SURGERY WITH YOUR REGULAR TOOTHPASTE ? ? ?Day of Surgery: ? ?Take a shower the day of or night before with antibacterial soap. ?Wear Clean/Comfortable clothing the morning of surgery ?Do not apply any deodorants/lotions.   ?Do not wear jewelry or makeup ?Do not wear lotions, powders, perfumes/colognes, or deodorant. ?Do not shave 48 hours prior to surgery.  Men may shave face and neck. ?Do not bring valuables to the hospital. ?Do not wear nail polish, gel polish, artificial nails, or any other type of covering on natural nails (fingers and toes) ?If you have artificial nails or gel coating that need to be removed by a nail salon, please have this removed prior to surgery. Artificial nails or gel coating may interfere with anesthesia's ability to adequately monitor your vital signs. ?Remember to brush your teeth WITH YOUR REGULAR TOOTHPASTE. ? ? ? ? ? ?

## 2021-07-23 ENCOUNTER — Other Ambulatory Visit: Payer: Self-pay | Admitting: Surgical

## 2021-07-23 ENCOUNTER — Encounter (HOSPITAL_COMMUNITY): Payer: Self-pay | Admitting: Physician Assistant

## 2021-07-23 NOTE — Progress Notes (Signed)
Anesthesia Chart Review: ?Same day workup ? ?Patient evaluated by cardiologist Dr. Johnsie Cancel in March 2022 with symptoms of chest pain, shortness of breath, leg edema.  She had a CXR which was normal.  An echocardiogram showed normal LVF.  A Myoview was low risk.  Last seen by Richardson Dopp, PA-C 11/10/2020.  Stable at that time, no changes made to management.  Advised to follow-up in 1 year. ? ?Follows with Dr. Neldon Mc for history of moderate persistent asthma.  She has chronic shortness of breath with exertion.  She is maintained on montelukast, Trelegy, and Nasacort.  She is also maintained on cetirizine and gabapentin for pruritic disorder. ? ?Patient will need day of surgery labs and evaluation. ? ?EKG 07/17/2020: NSR.  Rate 84. ? ?TTE 08/18/2020: ? 1. Left ventricular ejection fraction, by estimation, is 60 to 65%. The  ?left ventricle has normal function. The left ventricle has no regional  ?wall motion abnormalities. There is moderate left ventricular hypertrophy  ?of the basal-septal segment. Left  ?ventricular diastolic parameters are indeterminate. Global longitudinal  ?strain performed but not reported based on interpreter judgement due to  ?suboptimal tracking  ? 2. Right ventricular systolic function is normal. The right ventricular  ?size is normal. Tricuspid regurgitation signal is inadequate for assessing  ?PA pressure.  ? 3. The mitral valve is normal in structure. No evidence of mitral valve  ?regurgitation. No evidence of mitral stenosis.  ? 4. The aortic valve is normal in structure. Aortic valve regurgitation is  ?not visualized. No aortic stenosis is present.  ? 5. The inferior vena cava is normal in size with greater than 50%  ?respiratory variability, suggesting right atrial pressure of 3 mmHg.  ? ?Nuclear stress 07/27/2020: ?The left ventricular ejection fraction is normal (55-65%). ?Nuclear stress EF: 62%. ?There was no ST segment deviation noted during stress. ?The study is normal. ?This is a low  risk study. ?  ?Negative stress test:  No evidence of ischemia or infarction. ? ? ? ?Karoline Caldwell, PA-C ?[facility] Short Stay Center/Anesthesiology ?Phone [phone] ?07/23/2021 10:06 AM ? ?

## 2021-07-23 NOTE — Telephone Encounter (Signed)
Please advise 

## 2021-07-23 NOTE — Anesthesia Preprocedure Evaluation (Deleted)
Anesthesia Evaluation  ? ? ?Airway ? ? ? ? ? ? ? Dental ?  ?Pulmonary ?former smoker,  ?  ? ? ? ? ? ? ? Cardiovascular ?hypertension,  ? ? ?  ?Neuro/Psych ?  ? GI/Hepatic ?  ?Endo/Other  ?diabetes ? Renal/GU ?  ? ?  ?Musculoskeletal ? ? Abdominal ?  ?Peds ? Hematology ?  ?Anesthesia Other Findings ? ? Reproductive/Obstetrics ? ?  ? ? ? ? ? ? ? ? ? ? ? ? ? ?  ?  ? ? ? ? ? ? ? ? ?Anesthesia Physical ?Anesthesia Plan ? ?ASA:  ? ?Anesthesia Plan:   ? ?Post-op Pain Management:   ? ?Induction:  ? ?PONV Risk Score and Plan:  ? ?Airway Management Planned:  ? ?Additional Equipment:  ? ?Intra-op Plan:  ? ?Post-operative Plan:  ? ?Informed Consent:  ? ?Plan Discussed with:  ? ?Anesthesia Plan Comments: (PAT note by Karoline Caldwell, PA-C: ?Patient evaluated by cardiologist Dr. Johnsie Cancel in March 2022 with symptoms of chest pain, shortness of breath, leg edema. ?She had a CXR which was normal. ?An echocardiogram showed normal LVF. ?A Myoview was low risk.  Last seen by Richardson Dopp, PA-C 11/10/2020.  Stable at that time, no changes made to management.  Advised to follow-up in 1 year. ? ?Follows with Dr. Neldon Mc for history of moderate persistent asthma.  She has chronic shortness of breath with exertion.  She is maintained on montelukast, Trelegy, and Nasacort.  She is also maintained on cetirizine and gabapentin for pruritic disorder. ? ?Patient will need day of surgery labs and evaluation. ? ?EKG 07/17/2020: NSR.  Rate 84. ? ?TTE 08/18/2020: ??1. Left ventricular ejection fraction, by estimation, is 60 to 65%. The  ?left ventricle has normal function. The left ventricle has no regional  ?wall motion abnormalities. There is moderate left ventricular hypertrophy  ?of the basal-septal segment. Left  ?ventricular diastolic parameters are indeterminate. Global longitudinal  ?strain performed but not reported based on interpreter judgement due to  ?suboptimal tracking  ??2. Right ventricular systolic function  is normal. The right ventricular  ?size is normal. Tricuspid regurgitation signal is inadequate for assessing  ?PA pressure.  ??3. The mitral valve is normal in structure. No evidence of mitral valve  ?regurgitation. No evidence of mitral stenosis.  ??4. The aortic valve is normal in structure. Aortic valve regurgitation is  ?not visualized. No aortic stenosis is present.  ??5. The inferior vena cava is normal in size with greater than 50%  ?respiratory variability, suggesting right atrial pressure of 3 mmHg.  ? ?Nuclear stress 07/27/2020: ?? The left ventricular ejection fraction is normal (55-65%). ?? Nuclear stress EF: 62%. ?? There was no ST segment deviation noted during stress. ?? The study is normal. ?? This is a low risk study. ?? ?Negative stress test: ?No evidence of ischemia or infarction. ?)  ? ? ? ? ? ? ?Anesthesia Quick Evaluation ? ?

## 2021-07-26 ENCOUNTER — Ambulatory Visit (HOSPITAL_COMMUNITY): Admission: RE | Admit: 2021-07-26 | Payer: Medicaid Other | Source: Home / Self Care | Admitting: Orthopedic Surgery

## 2021-07-26 ENCOUNTER — Other Ambulatory Visit: Payer: Self-pay | Admitting: Surgical

## 2021-07-26 ENCOUNTER — Ambulatory Visit: Payer: Medicaid Other

## 2021-07-26 SURGERY — SHOULDER ARTHROSCOPY WITH OPEN ROTATOR CUFF REPAIR AND DISTAL CLAVICLE ACROMINECTOMY
Anesthesia: General | Site: Shoulder | Laterality: Left

## 2021-07-26 MED ORDER — TRAMADOL HCL 50 MG PO TABS: 50.0000 mg | ORAL_TABLET | Freq: Every day | ORAL | 0 refills | Status: DC | PRN

## 2021-07-26 NOTE — Telephone Encounter (Signed)
I called patient and advised. 

## 2021-07-26 NOTE — Telephone Encounter (Signed)
Sent in a prescription for tramadol which patient has taken in the past in 2021.  Let me know if she does not like this or cannot take it.

## 2021-08-02 ENCOUNTER — Other Ambulatory Visit: Payer: Self-pay | Admitting: Neurology

## 2021-08-03 ENCOUNTER — Encounter: Payer: Self-pay | Admitting: Infectious Diseases

## 2021-08-03 ENCOUNTER — Other Ambulatory Visit: Payer: Self-pay

## 2021-08-03 ENCOUNTER — Ambulatory Visit (INDEPENDENT_AMBULATORY_CARE_PROVIDER_SITE_OTHER): Payer: Medicaid Other | Admitting: Infectious Diseases

## 2021-08-03 VITALS — BP 145/88 | HR 88 | Temp 97.8°F | Resp 16 | Ht 61.0 in | Wt 183.0 lb

## 2021-08-03 DIAGNOSIS — B20 Human immunodeficiency virus [HIV] disease: Secondary | ICD-10-CM | POA: Diagnosis not present

## 2021-08-03 DIAGNOSIS — H6691 Otitis media, unspecified, right ear: Secondary | ICD-10-CM | POA: Insufficient documentation

## 2021-08-03 DIAGNOSIS — H7291 Unspecified perforation of tympanic membrane, right ear: Secondary | ICD-10-CM | POA: Diagnosis not present

## 2021-08-03 DIAGNOSIS — R682 Dry mouth, unspecified: Secondary | ICD-10-CM

## 2021-08-03 MED ORDER — CEFDINIR 300 MG PO CAPS: 300.0000 mg | ORAL_CAPSULE | Freq: Two times a day (BID) | ORAL | 0 refills | Status: AC

## 2021-08-03 MED ORDER — CHLORHEXIDINE GLUCONATE 0.12 % MT SOLN: 15.0000 mL | Freq: Two times a day (BID) | OROMUCOSAL | 2 refills | Status: DC

## 2021-08-03 NOTE — Patient Instructions (Addendum)
Will give you an antibiotic to take twice a day for 10-days  ? ?Will see you back in October for a pap smear and a regular check up.  (Tell the girls 30 minute visit for this appointment please).  ? ?Please continue your biktarvy everyday.  ? ?Mouth rinse to help has been sent in for you as well.  ?

## 2021-08-03 NOTE — Assessment & Plan Note (Signed)
Doing very well on Biktarvy for 5 years now. VL in January was undetectable.  ?Will update labs again in 6 months including CD4 count and creatinine level.  ?She is in care with primary care and other specialty teams.  ?Due for prevnar 20 in 2024 to complete pneumococcal series.  ?Overdue for pap smear - will schedule at next OV in October to update for her.  ?

## 2021-08-03 NOTE — Assessment & Plan Note (Signed)
She has acute otitis media with possible partially perforated ear drum on the right. She would like to avoid augmentin d/t stomach effects. Will see how she does on omnicef BID for 10 days.  ?She has appt with ENT next week for this problem as well.  ?

## 2021-08-03 NOTE — Progress Notes (Signed)
? ?Subjective:  ? ? [name], female    DOB: 1960/05/28, 61 y.o.   MRN: [id number] ? ? ?Chief Complaint  ?Patient presents with  ? Follow-up  ?  B20   ? ?  ?Brief Narrative:  ?[name] [name] is a 61 y.o. female with well controlled HIV (VL 36, CD4 800) on Biktarvy. She does not miss any doses of her HAART. She is a type 2 diabetic, on chronic prednisone for Addison's disease a h/o pituitary adenoma.  ? ?Previous regimens for HIV treatment include Lexiva/Norvir/Truvada, Isentress/Truvada (rashes), Prezcobix/Truvada (rash). History of treated Hepatitis C (completed 04/2013 with [facility]).  ? ? ? ?HPI:  ?Doing well overall. She is unhappy about moving her shoulder replacement surgery out to May due to insurance hang ups. Continues on [place] everyday without missed dose. She has been on this for 5 years now without problem. Had VL in January and was < 20 at that time.  ? ?Her right ear is leaking fluid leaking out of the ear a week now. Lot's of pain in the right ear and some degree the left. She describes flow of bloody/purulent fluid from the right ear during church Sunday and does feel as if she has had some hearing loss here.  She has had pain for a week. Prior to this had trouble with congestion and sinus pressure. Has an appt coming up with ENT next week.  ? ?White film on the tongue every morning - non painful and can easily scrape it off. Used a chg mouth rinse in the past that seemed to help and request refill.  ? ?She is on 5 mg 5 days a week of prednisone currently  ? ? ? ?Review of Systems  ?HENT:  Positive for congestion, ear pain and hearing loss.   ? ?   ?Objective:  ? Physical Exam ?Vitals reviewed.  ?Constitutional:   ?   Appearance: Normal appearance.  ?HENT:  ?   Right Ear: Decreased hearing noted. Drainage present. No mastoid tenderness. Tympanic membrane is scarred and bulging.  ?   Left Ear: Tympanic membrane is injected and erythematous.  ?   Ears:  ?   Comments: She has white material  in ear canal on the right. ?purulence or cotton from packing ear earlier today. She does have bulging/pulsating of ear drum in 2 o'clock position from what I can see.  ?Cardiovascular:  ?   Rate and Rhythm: Normal rate.  ?Neurological:  ?   Mental Status: She is alert.  ? ?   ?Assessment & Plan:  ? ?Problem List Items Addressed This Visit   ? ?  ? Unprioritized  ? HIV disease ([place]) (Chronic)  ?  Doing very well on Biktarvy for 5 years now. VL in January was undetectable.  ?Will update labs again in 6 months including CD4 count and creatinine level.  ?She is in care with primary care and other specialty teams.  ?Due for prevnar 20 in 2024 to complete pneumococcal series.  ?Overdue for pap smear - will schedule at next OV in October to update for her.  ?  ?  ? Relevant Medications  ? cefdinir (OMNICEF) 300 MG capsule  ? Acute otitis media with perforated tympanic membrane, right  ?  She has acute otitis media with possible partially perforated ear drum on the right. She would like to avoid augmentin d/t stomach effects. Will see how she does on omnicef BID for 10 days.  ?She has appt  with ENT next week for this problem as well.  ?  ?  ? Relevant Medications  ? cefdinir (OMNICEF) 300 MG capsule  ? ?Other Visit Diagnoses   ? ? Dry mouth    -  Primary  ? Relevant Medications  ? chlorhexidine (PERIDEX) 0.12 % solution  ? ?  ? ?[name], MSN, NP-C ?[place] [facility] ?[facility]  ?[email or web address] ?Pager: [phone] ?Office: [phone] ?[facility] Main Line: [phone]  ? ? ?

## 2021-08-04 ENCOUNTER — Encounter: Payer: Medicaid Other | Admitting: Orthopedic Surgery

## 2021-08-05 ENCOUNTER — Encounter: Payer: Medicaid Other | Admitting: Physical Medicine & Rehabilitation

## 2021-08-09 ENCOUNTER — Other Ambulatory Visit: Payer: Self-pay | Admitting: Obstetrics and Gynecology

## 2021-08-09 ENCOUNTER — Telehealth: Payer: Self-pay

## 2021-08-09 MED ORDER — FLUCONAZOLE 150 MG PO TABS: 150.0000 mg | ORAL_TABLET | Freq: Every day | ORAL | 1 refills | Status: DC

## 2021-08-09 MED ORDER — DIPHENOXYLATE-ATROPINE 2.5-0.025 MG PO TABS: 1.0000 | ORAL_TABLET | Freq: Four times a day (QID) | ORAL | 0 refills | Status: DC | PRN

## 2021-08-09 NOTE — Telephone Encounter (Signed)
Patient called complaining of vaginal burning and itching that she feels is due to the antibiotic. Patient would like some diflucan sent to the pharmacy. Patient also complaining of diarrhea and has tried OTC Imodium with no relief. Please advise ?Kathelyn Gombos T [place] ? ?

## 2021-08-09 NOTE — Patient Outreach (Signed)
?Medicaid Managed Care   ?Nurse Care Manager Note ? ?08/09/2021 ?Name:  [name] [name] MRN:  [id number] DOB:  1960/06/21 ? ?[name] [name] is an 61 y.o. year old female who is a primary patient of [name], MD.  The [facility] Managed Care Coordination team was consulted for assistance with:    ?Chronic healthcare management needs, HTN, DM, asthma, GERD, anxiety/depression, osteoarthritis, HIV ? ?[name] was given information about Medicaid Managed Care Coordination team services today. [name] Patient agreed to services and verbal consent obtained. ? ?Engaged with patient by telephone for follow up visit in response to provider referral for case management and/or care coordination services.  ? ?Assessments/Interventions:  Review of past medical history, allergies, medications, health status, including review of consultants reports, laboratory and other test data, was performed as part of comprehensive evaluation and provision of chronic care management services. ? ?SDOH (Social Determinants of Health) assessments and interventions performed: ?SDOH Interventions   ? ?Flowsheet Row Most Recent Value  ?SDOH Interventions   ?Food Insecurity Interventions Intervention Not Indicated  ? ?  ? ?Care Plan ? ?Allergies  ?Allergen Reactions  ? Acetaminophen Other (See Comments)  ?  Inflamed liver, hospitalized ?REACTION: Had liver problems, was hospitalized ?Inflamed liver, hospitalized ?Other reaction(s): liver demage ?  ? Morphine Sulfate Hives and Shortness Of Breath  ? Triamterene Hives  ? Aspirin-Caffeine Other (See Comments)  ?  liver damage; upset stomach  ? Citalopram Other (See Comments)  ?  Other reaction(s):  ? Dyazide [Hydrochlorothiazide W-Triamterene] Hives  ? Empagliflozin Diarrhea  ?  ([place]) stomach ache ?  ? Lisinopril   ?  Cough   ? Metformin Hcl Er Other (See Comments)  ?   upset stomach ?  ? Topamax [Topiramate] Other (See Comments)  ?  Vision disturbances.  ? Emtricitabine-Tenofovir Df  Rash  ?   Descovy  ? Losartan Nausea Only and Rash  ?  Pt had rash, worsening dizziness, and nausea after starting losartan, which improved after stopping losartan  ? Triamterene-Hctz Rash  ? ?Medications Reviewed Today   ? ? Reviewed by Gayla Medicus, RN (Registered Nurse) on 08/09/21 at [phone]  Med List Status: <None>  ? ?Medication Order Taking? Sig Documenting Provider Last Dose Status Informant  ?albuterol (VENTOLIN HFA) 108 (90 Base) MCG/ACT inhaler [id number] No Inhale 2 puffs into the lungs every 4 (four) hours as needed for wheezing or shortness of breath. Kozlow, Donnamarie Poag, MD Taking Active Self  ?amLODipine (NORVASC) 10 MG tablet [id number] No Take 1 tablet (10 mg total) by mouth daily. [name], MD Taking Active Self  ?atorvastatin (LIPITOR) 10 MG tablet [id number] No TAKE HALF TABLET BY MOUTH DAILY Katsadouros, Vasilios, MD Taking Active Self  ?bictegravir-emtricitabine-tenofovir AF (BIKTARVY) 50-200-25 MG TABS tablet [id number] No Take 1 tablet by mouth daily. Kerrick Callas, NP Taking Active Self  ?buPROPion (WELLBUTRIN XL) 300 MG 24 hr tablet [id number] No TAKE ONE TABLET BY MOUTH DAILY  ?Patient taking differently: Take 300 mg by mouth every evening.  ? [name], MD Taking Active Self  ?cefdinir (OMNICEF) 300 MG capsule [id number]  Take 1 capsule (300 mg total) by mouth 2 (two) times daily for 10 days.  Callas, NP  Active   ?cetirizine (ZYRTEC) 10 MG tablet [id number] No Take 2 tablets (20 mg total) by mouth 2 (two) times daily. 1 tablet daily  ?Patient taking differently: Take 10 mg by mouth in the morning.  ? Kozlow, Donnamarie Poag, MD Taking Active  Self  ?chlorhexidine (PERIDEX) 0.12 % solution [id number]  Use as directed 15 mLs in the mouth or throat 2 (two) times daily. [place] Callas, NP  Active   ?cholecalciferol (VITAMIN D) 25 MCG (1000 UNIT) tablet [id number] No TAKE ONE TABLET BY MOUTH DAILY [name], MD Taking Active Self  ?cyclobenzaprine (FLEXERIL) 10 MG tablet  [id number] No Take 1 tablet (10 mg total) by mouth 3 (three) times daily as needed for muscle spasms. Magnant, Gerrianne Scale, PA-C Taking Active Self  ?famotidine (PEPCID) 40 MG tablet [id number] No Take 1 tablet (40 mg total) by mouth at bedtime. Kozlow, Donnamarie Poag, MD Taking Active Self  ?gabapentin (NEURONTIN) 100 MG capsule [id number] No Take 1 capsule (100 mg total) by mouth 3 (three) times daily.  ?Patient taking differently: Take 200 mg by mouth 3 (three) times daily.  ? Kozlow, Donnamarie Poag, MD Taking Active Self  ?hydrocortisone (CORTEF) 5 MG tablet [id number] No Take 2.5-5 mg by mouth See admin instructions. Take 1 tablet (5 mg) by mouth in the morning (scheduled) & may taken an additional 0.5 tablet (2.5 mg) by mouth in the evening if needed. [provider] Taking Active Self  ?liraglutide (VICTOZA) 18 MG/3ML SOPN [id number] No Inject 1.2 mg into the skin every evening. [provider] Taking Active Self  ?metoprolol tartrate (LOPRESSOR) 25 MG tablet [id number] No Take 1 tablet (25 mg total) by mouth 2 (two) times daily. [name], MD Taking Active Self  ?montelukast (SINGULAIR) 10 MG tablet [id number] No Take 1 tablet (10 mg total) by mouth at bedtime. Kozlow, Donnamarie Poag, MD Taking Active Self  ?nitroGLYCERIN (NITROSTAT) 0.4 MG SL tablet [id number] No Place 1 tablet (0.4 mg total) under the tongue every 5 (five) minutes as needed for chest pain. Isaiah Serge, NP Taking Active Self  ?nortriptyline (PAMELOR) 10 MG capsule [id number] No Take 2 capsules (20 mg total) by mouth at bedtime. Marcial Pacas, MD Taking Active Self  ?pantoprazole (PROTONIX) 40 MG tablet [id number] No Take 1 tablet (40 mg total) by mouth 2 (two) times daily. [name], MD Taking Active Self  ?potassium chloride SA (KLOR-CON) 20 MEQ tablet [id number] No Take 20 mEq by mouth in the morning. [provider] Taking Active Self  ?promethazine (PHENERGAN) 25 MG tablet [id number] No Take 1 tablet (25 mg total) by mouth every 6  (six) hours as needed for nausea or vomiting. [name], DO Taking Active Self  ?RESTASIS 0.05 % ophthalmic emulsion [id number] No Place 1 drop into both eyes 2 (two) times daily. [provider] Taking Active Self  ?sodium chloride (OCEAN) 0.65 % SOLN nasal spray [id number] No PLACE 1 SPRAY INTO BOTH NOSTRILS AS NEEDED FOR CONGESTION. [name], DO Taking Active Self  ?SUMAtriptan (IMITREX) 25 MG tablet [id number] No Take 1 tablet (25 mg total) by mouth every 2 (two) hours as needed for migraine. May repeat in 2 hours if headache persists or recurs. Marcial Pacas, MD Taking Active Self  ?SYMBICORT 160-4.5 MCG/ACT inhaler [id number] No Inhale 2 puffs into the lungs 2 (two) times daily. Kozlow, Donnamarie Poag, MD Taking Active Self  ?traMADol (ULTRAM) 50 MG tablet [id number] No Take 1 tablet (50 mg total) by mouth daily as needed. Magnant, Gerrianne Scale, PA-C Taking Active   ?triamcinolone (NASACORT) 55 MCG/ACT AERO nasal inhaler [id number] No 1 spray each nostril 2 times per day Jiles Prows, MD Taking Active Self  ? ?  ?  ? ?  ? ?Patient Active Problem List  ?  Diagnosis Date Noted  ? Acute otitis media with perforated tympanic membrane, right 08/03/2021  ? Impingement syndrome of left shoulder 07/01/2021  ? Left leg swelling 07/09/2020  ? Grief reaction 06/08/2020  ? Arm pain 02/10/2020  ? Dizziness on standing 12/27/2019  ? 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 Osteoarthritis of thumb, right 11/29/2018  ? Chronic migraine w/o aura w/o status migrainosus, not intractable 08/27/2018  ? Disorder of left eustachian tube 05/19/2017  ? Vertigo of central origin 01/23/2017  ? Central perforation of tympanic membrane of left ear 12/09/2016  ? Eustachian tube dysfunction, bilateral 06/15/2016  ? Ear pain, right 06/15/2016  ? 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 BPPV (benign paroxysmal positional vertigo) 10/02/2015  ? Liver fibrosis ([place])  12/04/2014  ? Pituitary microadenoma ([place]) 08/08/2014  ? 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 Vitamin D deficiency 06/29/2014  ? Secondary adrenal insufficiency ([place]) 06/29/2014  ? His

## 2021-08-09 NOTE — Telephone Encounter (Signed)
Will do a 3-day treatment with Diflucan for her - I have also put a refill on this in the event it does not resolve with first attempt.  ? ?Let's try some Lomotil for a prescription strength diarrhea - If this does not control her diarrhea please have her contact us back to be seen and make sure not something more serious. She can take this up to 4 times a day.  ?

## 2021-08-09 NOTE — Addendum Note (Signed)
Addended by: Isabella Callas on: 08/09/2021 11:00 AM ? ? Modules accepted: Orders ? ?

## 2021-08-09 NOTE — Telephone Encounter (Signed)
Patient advised that diflucan and lomotil have both been sent to the pharmacy. I have instructed her on medications for both medications.  ?

## 2021-08-09 NOTE — Patient Instructions (Signed)
Hi [name] [name] [name] for speaking with me even though you do not feel well-I hope [name] [name] feel better!! ? ?[name] [name] [name] was given information about Medicaid Managed Care team care coordination services as a part of their [place] Medicaid benefit. [name] [name] [name] verbally consented to engagement with the [facility] Managed Care team.  ? ?If [name] [name] are experiencing a medical emergency, please call 911 or report to your local emergency department or urgent care.  ? ?If [name] [name] have a non-emergency medical problem during routine business hours, please contact your provider's office and ask to speak with a nurse.  ? ?For questions related to your [facility], please call: [phone] or visit the homepage here: [email or web address] ? ?If [name] [name] would like to schedule transportation through your [facility], please call the following number at least 2 days in advance of your appointment: [phone]. ? Rides for urgent appointments can also be made after hours by calling Member Services. ? ?Call the [place] at [phone], at any time, 24 hours a day, 7 days a week. If [name] [name] are in danger or need immediate medical attention call 911. ? ?If [name] [name] [name], call 1-800-QUIT-NOW [phone]) OR Espa?ol: 1-855-D?jelo-Ya [phone]) o para m?s informaci?n haga clic aqu? or Text READY to 200-400 to register via text ? ?[name] [name] [name] - following are the goals we discussed in your visit today:  ? Goals Addressed   ? ?  ?  ?  ?  ? This Visit's Progress  ?  Protect My Health     ?  Timeframe:  Long-Range Goal ?Priority:  Medium ?Start Date:      05/07/20                       ?Expected End Date: ongoing            ? ?Follow Up Date: 09/10/21 ?  ?- schedule appointment for vaccines needed due to my age or health ?- schedule recommended health tests (blood work, mammogram,  colonoscopy, pap test) ?- schedule and keep appointment for annual check-up  ?  ?Why is this important?   ?Screening tests can find diseases early when they are easier to treat.  ?Your doctor or nurse will talk with [name] [name] about which tests are important for [name] [name].  ?Getting shots for common diseases like the flu and shingles will help prevent them.   ?  ?08/09/21:  Shoulder surgery in May-Dr. Marlou Sa, to see ENT tomorrow regarding ear drainage and hearing loss  ? ?Patient verbalizes understanding of instructions and care plan provided today and agrees to view in [place]. Active MyChart status confirmed with patient.   ? ?The Managed Medicaid care management team will reach out to the patient again over the next 30 days.  ?The  Patient  has been provided with contact information for the Managed Medicaid care management team and has been advised to call with any health related questions or concerns.  ? ?Aida Raider RN, BSN ?[facility] ?Care Management Coordinator - Managed Medicaid High Risk ?[phone] ? ?Following is a copy of your plan of care:  ? ?Care Plan : General Plan of Care (Adult)  ?Updates made by Gayla Medicus, RN since 08/09/2021 12:00 AM  ?  ? ?Problem: Health Promotion or Disease Self-Management (General Plan of Care)   ?Priority: Medium  ?Onset Date: 08/10/2020  ?  ? ?Long-Range  Goal: Self-Management Plan Developed   ?Start Date: 05/07/2020  ?Expected End Date: 10/09/2021  ?Recent Progress: On track  ?Priority: Medium  ?Note:   ?Current Barriers:  ?Chronic Disease Management support and education needs.  ?08/09/21:  Patient has ENT appt scheduled for tomorrow for evaluation of ear drainage and hearing loss.  To have surgery in May-right shoulder.  Complaining of left ankle swelling -is wearing compression stocking which helps her. She is having yeast infection today from taking antibiotics for right ear.-states she will call provider for Diflucan prescription ? ?Nurse Case Manager  Clinical Goal(s):  ?Over the next 30 days, patient will attend all scheduled medical appointments ? ?Interventions:  ?Inter-disciplinary care team collaboration (see longitudinal plan of care) ?Evaluation of current treatment plan and patient's adherence to plan as established by provider. ?Reviewed medications with patient. ?Collaborated with pharmacy regarding medications. ?Discussed plans with patient for ongoing care management follow up and provided patient with direct contact information for care management team ?Reviewed scheduled/upcoming provider appointments. ?Pharmacy referral for medication review-completed. ? ?Patient Goals/Self-Care Activities ?Over the next 30 days, patient will: ? -Attends all scheduled provider appointments ?Calls pharmacy for medication refills ?Calls provider office for new concerns or questions ? ?Follow Up Plan: The Managed Medicaid care management team will reach out to the patient again over the next 30 days.  ?The patient has been provided with contact information for the Managed Medicaid care management team and has been advised to call with any health related questions or concerns.   ?  ?

## 2021-08-10 DIAGNOSIS — Z9622 Myringotomy tube(s) status: Secondary | ICD-10-CM | POA: Diagnosis not present

## 2021-08-10 DIAGNOSIS — H6983 Other specified disorders of Eustachian tube, bilateral: Secondary | ICD-10-CM | POA: Diagnosis not present

## 2021-08-10 DIAGNOSIS — H7293 Unspecified perforation of tympanic membrane, bilateral: Secondary | ICD-10-CM | POA: Diagnosis not present

## 2021-08-10 DIAGNOSIS — Z8669 Personal history of other diseases of the nervous system and sense organs: Secondary | ICD-10-CM | POA: Diagnosis not present

## 2021-08-10 DIAGNOSIS — Z87891 Personal history of nicotine dependence: Secondary | ICD-10-CM | POA: Diagnosis not present

## 2021-08-10 DIAGNOSIS — H7401 Tympanosclerosis, right ear: Secondary | ICD-10-CM | POA: Diagnosis not present

## 2021-08-25 ENCOUNTER — Telehealth: Payer: Self-pay | Admitting: Orthopedic Surgery

## 2021-08-25 NOTE — Telephone Encounter (Signed)
Patient called. She would like to know if she should stop taking medications before surgery? ?

## 2021-08-26 NOTE — Telephone Encounter (Signed)
Preop appointment should advise her on what medications to stop in which she can take on the morning of surgery.  I does looked at her chart not see any blood thinning medications that she would have to stop today.

## 2021-08-27 ENCOUNTER — Other Ambulatory Visit: Payer: Self-pay | Admitting: Surgical

## 2021-08-27 NOTE — Telephone Encounter (Signed)
I called patient and advised. 

## 2021-08-30 ENCOUNTER — Encounter (HOSPITAL_COMMUNITY): Payer: Self-pay | Admitting: Orthopedic Surgery

## 2021-08-30 ENCOUNTER — Other Ambulatory Visit: Payer: Self-pay

## 2021-08-30 NOTE — Progress Notes (Signed)
Spoke with pt for pre-op call. Pt denies chest pain or shortness of breath. Sees Dr. Johnsie Cancel yearly. Pt has steroid induced diabetes. Last A1C was 6.5 on 06/30/21. States her fasting blood sugar is usually under 200. Instructed pt to check her blood sugar when she wakes up in the AM and every 2 hours until she leaves for the hospital. If blood sugar is 70 or below, treat with 1/2 cup of clear juice (apple or cranberry) and recheck blood sugar 15 minutes after drinking juice. If blood sugar continues to be 70 or below, call the Short Stay department and ask to speak to a nurse. Pt voiced understanding. ? ?Shower instructions given to pt.  ? ? ?

## 2021-08-30 NOTE — Anesthesia Preprocedure Evaluation (Addendum)
Anesthesia Evaluation  ?Patient identified by MRN, date of birth, ID band ?Patient awake ? ? ? ?Reviewed: ?Allergy & Precautions, NPO status , Patient's Chart, lab work & pertinent test results ? ?History of Anesthesia Complications ?Negative for: history of anesthetic complications ? ?Airway ?Mallampati: III ? ?TM Distance: >3 FB ?Neck ROM: Full ? ? ? Dental ? ?(+) Missing, Dental Advisory Given,  ?  ?Pulmonary ?neg shortness of breath, asthma , neg sleep apnea, neg COPD, neg recent URI, former smoker,  ?  ?breath sounds clear to auscultation ? ? ? ? ? ? Cardiovascular ?hypertension, Pt. on medications ?(-) angina+CHF  ?(-) Past MI  ?Rhythm:Regular  ??1. Left ventricular ejection fraction, by estimation, is 60 to 65%. The  ?left ventricle has normal function. The left ventricle has no regional  ?wall motion abnormalities. There is moderate left ventricular hypertrophy  ?of the basal-septal segment. Left  ?ventricular diastolic parameters are indeterminate. Global longitudinal  ?strain performed but not reported based on interpreter judgement due to  ?suboptimal tracking  ??2. Right ventricular systolic function is normal. The right ventricular  ?size is normal. Tricuspid regurgitation signal is inadequate for assessing  ?PA pressure.  ??3. The mitral valve is normal in structure. No evidence of mitral valve  ?regurgitation. No evidence of mitral stenosis.  ??4. The aortic valve is normal in structure. Aortic valve regurgitation is  ?not visualized. No aortic stenosis is present.  ??5. The inferior vena cava is normal in size with greater than 50%  ?respiratory variability, suggesting right atrial pressure of 3 mmHg.  ? ? Michaelyn Barter, RN  ?07/28/2020 ?1:54 PM EDT  ? ?The patient has been notified of the result and verbalized understanding. ?All questions (if any) were answered. ?Michaelyn Barter, RN 07/28/2020 1:54 PM  ?? ?Will send copy to patient's PCP.  ? Josue Hector,  MD  ?07/28/2020 ?1:52 PM EDT  ? ?Normal myovue study with no evidence of ischemia or infarction ? ? ?  ?Neuro/Psych ? Headaches, PSYCHIATRIC DISORDERS Anxiety Depression   ? GI/Hepatic ?GERD  Medicated and Controlled,(+) Hepatitis -, CLab Results ?     Component                Value               Date                 ?     ALT                      28                  02/02/2021           ?     AST                      22                  02/02/2021           ?     ALKPHOS                  87                  07/12/2016           ?     BILITOT  0.4                 02/02/2021           ? ?  ?Endo/Other  ?diabetesLab Results ?     Component                Value               Date                 ?     HGBA1C                   6.5 (A)             06/30/2021           ? ? Renal/GU ?negative Renal ROS  ? ?  ?Musculoskeletal ? ?(+) Arthritis ,  ? Abdominal ?  ?Peds ? Hematology ? ?(+) REFUSES BLOOD PRODUCTS, JEHOVAH'S WITNESSLab Results ?     Component                Value               Date                 ?     WBC                      6.7                 08/31/2021           ?     HGB                      14.6                08/31/2021           ?     HCT                      43.7                08/31/2021           ?     MCV                      84.5                08/31/2021           ?     PLT                      223                 08/31/2021           ?   ?Anesthesia Other Findings ? ? Reproductive/Obstetrics ? ?  ? ? ? ? ? ? ? ? ? ? ? ? ? ?  ?  ? ? ? ? ? ? ?Anesthesia Physical ?Anesthesia Plan ? ?ASA: 2 ? ?Anesthesia Plan: General and Regional  ? ?Post-op Pain Management: Regional block*  ? ?Induction: Intravenous ? ?PONV Risk Score and Plan: 3 and Ondansetron and Dexamethasone ? ?Airway Management Planned:  ? ?Additional Equipment: None ? ?Intra-op Plan:  ? ?Post-operative Plan: Extubation in OR ? ?Informed Consent: I have reviewed the patients History and Physical, chart, labs and discussed the  procedure  including the risks, benefits and alternatives for the proposed anesthesia with the patient or authorized representative who has indicated his/her understanding and acceptance.  ? ? ? ?Dental advisory given ? ?Plan Discussed with: CRNA ? ?Anesthesia Plan Comments: (See previous PAT note by Karoline Caldwell, PA-C 07/23/21, surgery postponed at that time due to insurance issues. No interim health changes noted. ?)  ? ? ? ? ? ?Anesthesia Quick Evaluation ? ?

## 2021-08-31 ENCOUNTER — Encounter (HOSPITAL_COMMUNITY): Payer: Self-pay | Admitting: Orthopedic Surgery

## 2021-08-31 ENCOUNTER — Other Ambulatory Visit: Payer: Self-pay | Admitting: Infectious Diseases

## 2021-08-31 ENCOUNTER — Ambulatory Visit (HOSPITAL_COMMUNITY)
Admission: RE | Admit: 2021-08-31 | Discharge: 2021-08-31 | Disposition: A | Discharge status: Home / Self Care | Payer: Medicaid Other | Source: Ambulatory Visit | Attending: Orthopedic Surgery | Admitting: Orthopedic Surgery

## 2021-08-31 ENCOUNTER — Encounter (HOSPITAL_COMMUNITY)
Admission: RE | Disposition: A | Discharge status: Home / Self Care | Payer: Self-pay | Source: Ambulatory Visit | Attending: Orthopedic Surgery

## 2021-08-31 ENCOUNTER — Ambulatory Visit (HOSPITAL_BASED_OUTPATIENT_CLINIC_OR_DEPARTMENT_OTHER): Payer: Medicaid Other | Admitting: Vascular Surgery

## 2021-08-31 ENCOUNTER — Other Ambulatory Visit: Payer: Self-pay

## 2021-08-31 ENCOUNTER — Other Ambulatory Visit: Payer: Self-pay | Admitting: Allergy and Immunology

## 2021-08-31 ENCOUNTER — Ambulatory Visit (HOSPITAL_COMMUNITY): Payer: Medicaid Other | Admitting: Vascular Surgery

## 2021-08-31 DIAGNOSIS — F419 Anxiety disorder, unspecified: Secondary | ICD-10-CM | POA: Diagnosis not present

## 2021-08-31 DIAGNOSIS — F32A Depression, unspecified: Secondary | ICD-10-CM | POA: Diagnosis not present

## 2021-08-31 DIAGNOSIS — I110 Hypertensive heart disease with heart failure: Secondary | ICD-10-CM | POA: Insufficient documentation

## 2021-08-31 DIAGNOSIS — Z7985 Long-term (current) use of injectable non-insulin antidiabetic drugs: Secondary | ICD-10-CM | POA: Insufficient documentation

## 2021-08-31 DIAGNOSIS — M75102 Unspecified rotator cuff tear or rupture of left shoulder, not specified as traumatic: Secondary | ICD-10-CM

## 2021-08-31 DIAGNOSIS — Z87891 Personal history of nicotine dependence: Secondary | ICD-10-CM | POA: Diagnosis not present

## 2021-08-31 DIAGNOSIS — M75122 Complete rotator cuff tear or rupture of left shoulder, not specified as traumatic: Secondary | ICD-10-CM

## 2021-08-31 DIAGNOSIS — Z01818 Encounter for other preprocedural examination: Secondary | ICD-10-CM

## 2021-08-31 DIAGNOSIS — Z79899 Other long term (current) drug therapy: Secondary | ICD-10-CM | POA: Diagnosis not present

## 2021-08-31 DIAGNOSIS — M65812 Other synovitis and tenosynovitis, left shoulder: Secondary | ICD-10-CM

## 2021-08-31 DIAGNOSIS — M75112 Incomplete rotator cuff tear or rupture of left shoulder, not specified as traumatic: Secondary | ICD-10-CM | POA: Diagnosis not present

## 2021-08-31 DIAGNOSIS — M659 Synovitis and tenosynovitis, unspecified: Secondary | ICD-10-CM

## 2021-08-31 DIAGNOSIS — M65912 Unspecified synovitis and tenosynovitis, left shoulder: Secondary | ICD-10-CM

## 2021-08-31 DIAGNOSIS — K219 Gastro-esophageal reflux disease without esophagitis: Secondary | ICD-10-CM | POA: Diagnosis not present

## 2021-08-31 DIAGNOSIS — M7532 Calcific tendinitis of left shoulder: Secondary | ICD-10-CM | POA: Diagnosis not present

## 2021-08-31 DIAGNOSIS — K759 Inflammatory liver disease, unspecified: Secondary | ICD-10-CM | POA: Insufficient documentation

## 2021-08-31 DIAGNOSIS — I509 Heart failure, unspecified: Secondary | ICD-10-CM | POA: Diagnosis not present

## 2021-08-31 DIAGNOSIS — G8918 Other acute postprocedural pain: Secondary | ICD-10-CM | POA: Diagnosis not present

## 2021-08-31 DIAGNOSIS — E119 Type 2 diabetes mellitus without complications: Secondary | ICD-10-CM | POA: Insufficient documentation

## 2021-08-31 DIAGNOSIS — L299 Pruritus, unspecified: Secondary | ICD-10-CM

## 2021-08-31 HISTORY — DX: Pneumonia, unspecified organism: J18.9

## 2021-08-31 HISTORY — PX: SHOULDER ARTHROSCOPY WITH OPEN ROTATOR CUFF REPAIR AND DISTAL CLAVICLE ACROMINECTOMY: SHX5683

## 2021-08-31 LAB — BASIC METABOLIC PANEL
Anion gap: 8 (ref 5–15)
BUN: 8 mg/dL (ref 6–20)
CO2: 24 mmol/L (ref 22–32)
Calcium: 8.9 mg/dL (ref 8.9–10.3)
Chloride: 107 mmol/L (ref 98–111)
Creatinine, Ser: 0.71 mg/dL (ref 0.44–1.00)
GFR, Estimated: >60 mL/min (ref >60)
Glucose, Bld: 130 mg/dL — ABNORMAL HIGH (ref 70–99)
Potassium: 4 mmol/L (ref 3.5–5.1)
Sodium: 139 mmol/L (ref 135–145)

## 2021-08-31 LAB — CBC
HCT: 43.7 % (ref 36.0–46.0)
Hemoglobin: 14.6 g/dL (ref 12.0–15.0)
MCH: 28.2 pg (ref 26.0–34.0)
MCHC: 33.4 g/dL (ref 30.0–36.0)
MCV: 84.5 fL (ref 80.0–100.0)
Platelets: 223 10*3/uL (ref 150–400)
RBC: 5.17 MIL/uL — ABNORMAL HIGH (ref 3.87–5.11)
RDW: 15.1 % (ref 11.5–15.5)
WBC: 6.7 10*3/uL (ref 4.0–10.5)
nRBC: 0 % (ref 0.0–0.2)

## 2021-08-31 LAB — SURGICAL PCR SCREEN
MRSA, PCR: NEGATIVE
Staphylococcus aureus: POSITIVE — AB

## 2021-08-31 LAB — GLUCOSE, CAPILLARY
Glucose-Capillary: 49 mg/dL — ABNORMAL LOW (ref 70–99)
Glucose-Capillary: 232 mg/dL — ABNORMAL HIGH (ref 70–99)
Glucose-Capillary: 111 mg/dL — ABNORMAL HIGH (ref 70–99)
Glucose-Capillary: 106 mg/dL — ABNORMAL HIGH (ref 70–99)
Glucose-Capillary: 169 mg/dL — ABNORMAL HIGH (ref 70–99)

## 2021-08-31 SURGERY — SHOULDER ARTHROSCOPY WITH OPEN ROTATOR CUFF REPAIR AND DISTAL CLAVICLE ACROMINECTOMY
Anesthesia: Regional | Site: Shoulder | Laterality: Left

## 2021-08-31 MED ORDER — PROPOFOL 10 MG/ML IV BOLUS
INTRAVENOUS | Status: DC | PRN
  Administered 2021-08-31: 30 mg via INTRAVENOUS
  Administered 2021-08-31: 130 mg via INTRAVENOUS

## 2021-08-31 MED ORDER — LIDOCAINE 2% (20 MG/ML) 5 ML SYRINGE
INTRAMUSCULAR | Status: DC | PRN
  Administered 2021-08-31: 60 mg via INTRAVENOUS

## 2021-08-31 MED ORDER — BUPIVACAINE HCL (PF) 0.25 % IJ SOLN
INTRAMUSCULAR | Status: AC
  Filled 2021-08-31: qty 30

## 2021-08-31 MED ORDER — DEXTROSE 50 % IV SOLN: 25.0000 g | INTRAVENOUS | Status: AC

## 2021-08-31 MED ORDER — PHENYLEPHRINE HCL-NACL 20-0.9 MG/250ML-% IV SOLN
INTRAVENOUS | Status: DC | PRN
  Administered 2021-08-31: 50 ug/min via INTRAVENOUS

## 2021-08-31 MED ORDER — POVIDONE-IODINE 10 % EX SWAB
2.0000 "application " | Freq: Once | CUTANEOUS | Status: AC
  Administered 2021-08-31: 2 via TOPICAL

## 2021-08-31 MED ORDER — OXYCODONE HCL 5 MG/5ML PO SOLN: 5.0000 mg | Freq: Once | ORAL | Status: DC | PRN

## 2021-08-31 MED ORDER — SODIUM CHLORIDE (PF) 0.9 % IJ SOLN
INTRAMUSCULAR | Status: DC | PRN
  Administered 2021-08-31: 10 mL
  Administered 2021-08-31: 20 mL

## 2021-08-31 MED ORDER — DEXTROSE 50 % IV SOLN
INTRAVENOUS | Status: AC
Start: 2021-08-31 — End: 2021-08-31
  Administered 2021-08-31: 25 g via INTRAVENOUS
  Filled 2021-08-31: qty 50

## 2021-08-31 MED ORDER — LACTATED RINGERS IV SOLN: INTRAVENOUS | Status: DC

## 2021-08-31 MED ORDER — CEFAZOLIN SODIUM-DEXTROSE 2-4 GM/100ML-% IV SOLN
2.0000 g | INTRAVENOUS | Status: AC
  Administered 2021-08-31: 2 g via INTRAVENOUS
  Filled 2021-08-31: qty 100

## 2021-08-31 MED ORDER — FENTANYL CITRATE (PF) 100 MCG/2ML IJ SOLN: 50.0000 ug | Freq: Once | INTRAMUSCULAR | Status: AC

## 2021-08-31 MED ORDER — FENTANYL CITRATE (PF) 100 MCG/2ML IJ SOLN
INTRAMUSCULAR | Status: AC
  Administered 2021-08-31: 50 ug via INTRAVENOUS
  Filled 2021-08-31: qty 2

## 2021-08-31 MED ORDER — MIDAZOLAM HCL 2 MG/2ML IJ SOLN
INTRAMUSCULAR | Status: DC | PRN
  Administered 2021-08-31 (×2): 1 mg via INTRAVENOUS

## 2021-08-31 MED ORDER — BUPIVACAINE LIPOSOME 1.3 % IJ SUSP
INTRAMUSCULAR | Status: DC | PRN
  Administered 2021-08-31: 133 mg via PERINEURAL

## 2021-08-31 MED ORDER — 0.9 % SODIUM CHLORIDE (POUR BTL) OPTIME
TOPICAL | Status: DC | PRN
  Administered 2021-08-31: 1000 mL

## 2021-08-31 MED ORDER — MIDAZOLAM HCL 2 MG/2ML IJ SOLN
INTRAMUSCULAR | Status: AC
  Administered 2021-08-31: 2 mg via INTRAVENOUS
  Filled 2021-08-31: qty 2

## 2021-08-31 MED ORDER — EPINEPHRINE PF 1 MG/ML IJ SOLN
INTRAMUSCULAR | Status: DC | PRN
  Administered 2021-08-31: 2 mL

## 2021-08-31 MED ORDER — EPINEPHRINE PF 1 MG/ML IJ SOLN
INTRAMUSCULAR | Status: AC
  Filled 2021-08-31: qty 3

## 2021-08-31 MED ORDER — CHLORHEXIDINE GLUCONATE 0.12 % MT SOLN: 15.0000 mL | Freq: Once | OROMUCOSAL | Status: AC

## 2021-08-31 MED ORDER — SODIUM CHLORIDE 0.9 % IR SOLN
Status: DC | PRN
  Administered 2021-08-31 (×2): 3000 mL

## 2021-08-31 MED ORDER — OXYCODONE HCL 5 MG PO TABS: 5.0000 mg | ORAL_TABLET | Freq: Once | ORAL | Status: DC | PRN

## 2021-08-31 MED ORDER — CHLORHEXIDINE GLUCONATE 0.12 % MT SOLN
OROMUCOSAL | Status: AC
  Administered 2021-08-31: 15 mL via OROMUCOSAL
  Filled 2021-08-31: qty 15

## 2021-08-31 MED ORDER — ORAL CARE MOUTH RINSE: 15.0000 mL | Freq: Once | OROMUCOSAL | Status: AC

## 2021-08-31 MED ORDER — PROPOFOL 10 MG/ML IV BOLUS
INTRAVENOUS | Status: AC
  Filled 2021-08-31: qty 20

## 2021-08-31 MED ORDER — IRRISEPT - 450ML BOTTLE WITH 0.05% CHG IN STERILE WATER, USP 99.95% OPTIME
TOPICAL | Status: DC | PRN
  Administered 2021-08-31: 450 mL

## 2021-08-31 MED ORDER — TRAMADOL HCL 50 MG PO TABS: 100.0000 mg | ORAL_TABLET | Freq: Four times a day (QID) | ORAL | 0 refills | Status: DC | PRN

## 2021-08-31 MED ORDER — MIDAZOLAM HCL 2 MG/2ML IJ SOLN: 2.0000 mg | Freq: Once | INTRAMUSCULAR | Status: AC

## 2021-08-31 MED ORDER — ROCURONIUM BROMIDE 10 MG/ML (PF) SYRINGE
PREFILLED_SYRINGE | INTRAVENOUS | Status: DC | PRN
  Administered 2021-08-31: 80 mg via INTRAVENOUS

## 2021-08-31 MED ORDER — FENTANYL CITRATE (PF) 250 MCG/5ML IJ SOLN
INTRAMUSCULAR | Status: DC | PRN
  Administered 2021-08-31: 100 ug via INTRAVENOUS
  Administered 2021-08-31: 50 ug via INTRAVENOUS

## 2021-08-31 MED ORDER — ONDANSETRON HCL 4 MG/2ML IJ SOLN
INTRAMUSCULAR | Status: DC | PRN
  Administered 2021-08-31: 4 mg via INTRAVENOUS

## 2021-08-31 MED ORDER — MIDAZOLAM HCL 2 MG/2ML IJ SOLN
INTRAMUSCULAR | Status: AC
  Filled 2021-08-31: qty 2

## 2021-08-31 MED ORDER — POVIDONE-IODINE 7.5 % EX SOLN: Freq: Once | CUTANEOUS | Status: DC

## 2021-08-31 MED ORDER — BUPIVACAINE-EPINEPHRINE (PF) 0.5% -1:200000 IJ SOLN
INTRAMUSCULAR | Status: DC | PRN
  Administered 2021-08-31: 15 mL via PERINEURAL

## 2021-08-31 MED ORDER — FENTANYL CITRATE (PF) 100 MCG/2ML IJ SOLN: 25.0000 ug | INTRAMUSCULAR | Status: DC | PRN

## 2021-08-31 MED ORDER — FENTANYL CITRATE (PF) 250 MCG/5ML IJ SOLN
INTRAMUSCULAR | Status: AC
  Filled 2021-08-31: qty 5

## 2021-08-31 MED ORDER — SUGAMMADEX SODIUM 200 MG/2ML IV SOLN
INTRAVENOUS | Status: DC | PRN
  Administered 2021-08-31: 200 mg via INTRAVENOUS

## 2021-08-31 SURGICAL SUPPLY — 74 items
AID PSTN UNV HD RSTRNT DISP (MISCELLANEOUS) ×1
ANCH SUT 2 SWLK 19.1 CLS EYLT (Anchor) ×2 IMPLANT
ANCH SUT 2.6 FBRSTK 1.7 (Anchor) ×1 IMPLANT
ANCH SUT 2.6 FBRTK 1.7 KNTLS (Anchor) ×1 IMPLANT
ANCH SUT SWLK 19.1X6.25 CLS (Anchor) ×2 IMPLANT
ANCHOR SUT FBRTK 2.6X1.7X2 (Anchor) ×1 IMPLANT
ANCHOR SUT SWIVELLOK BIO (Anchor) ×2 IMPLANT
ANCHOR SWIVELOCK BIO 4.75X19.1 (Anchor) ×2 IMPLANT
BAG COUNTER SPONGE SURGICOUNT (BAG) ×2 IMPLANT
BAG SPNG CNTER NS LX DISP (BAG) ×1
BLADE EXCALIBUR 4.0X13 (MISCELLANEOUS) IMPLANT
BLADE SURG 11 STRL SS (BLADE) ×2 IMPLANT
BONE CANC CHIPS 20CC PCAN1/4 (Bone Implant) ×2 IMPLANT
BUR OVAL 6.0 (BURR) IMPLANT
CHIPS CANC BONE 20CC PCAN1/4 (Bone Implant) ×1 IMPLANT
COOLER ICEMAN CLASSIC (MISCELLANEOUS) ×1 IMPLANT
COVER SURGICAL LIGHT HANDLE (MISCELLANEOUS) ×2 IMPLANT
CUTTER BONE 4.0MM X 13CM (MISCELLANEOUS) ×1 IMPLANT
DRAPE INCISE IOBAN 66X45 STRL (DRAPES) ×4 IMPLANT
DRAPE STERI 35X30 U-POUCH (DRAPES) ×2 IMPLANT
DRAPE U-SHAPE 47X51 STRL (DRAPES) ×4 IMPLANT
DRSG PAD ABDOMINAL 8X10 ST (GAUZE/BANDAGES/DRESSINGS) ×6 IMPLANT
DRSG TEGADERM 4X4.75 (GAUZE/BANDAGES/DRESSINGS) ×3 IMPLANT
DURAPREP 26ML APPLICATOR (WOUND CARE) ×2 IMPLANT
ELECT REM PT RETURN 9FT ADLT (ELECTROSURGICAL) ×2
ELECTRODE REM PT RTRN 9FT ADLT (ELECTROSURGICAL) ×1 IMPLANT
FILTER STRAW FLUID ASPIR (MISCELLANEOUS) ×2 IMPLANT
GAUZE SPONGE 4X4 12PLY STRL (GAUZE/BANDAGES/DRESSINGS) ×1 IMPLANT
GAUZE XEROFORM 1X8 LF (GAUZE/BANDAGES/DRESSINGS) ×1 IMPLANT
GLOVE BIOGEL PI IND STRL 8 (GLOVE) ×1 IMPLANT
GLOVE BIOGEL PI INDICATOR 8 (GLOVE) ×1
GLOVE ECLIPSE 8.0 STRL XLNG CF (GLOVE) ×2 IMPLANT
GOWN STRL REUS W/ TWL LRG LVL3 (GOWN DISPOSABLE) ×3 IMPLANT
GOWN STRL REUS W/TWL LRG LVL3 (GOWN DISPOSABLE) ×6
GRAFT BNE CANC CHIPS 1-8 20CC (Bone Implant) IMPLANT
IMPL FIBERTAK KNTLS 2.6 (Anchor) IMPLANT
IMPLANT FIBERTAK KNTLS 2.6 (Anchor) ×2 IMPLANT
KIT BASIN OR (CUSTOM PROCEDURE TRAY) ×2 IMPLANT
KIT TURNOVER KIT B (KITS) ×2 IMPLANT
MANIFOLD NEPTUNE II (INSTRUMENTS) ×2 IMPLANT
NDL HYPO 25X1 1.5 SAFETY (NEEDLE) ×1 IMPLANT
NDL SCORPION MULTI FIRE (NEEDLE) IMPLANT
NDL SPNL 18GX3.5 QUINCKE PK (NEEDLE) ×1 IMPLANT
NDL SUT 6 .5 CRC .975X.05 MAYO (NEEDLE) IMPLANT
NEEDLE HYPO 25X1 1.5 SAFETY (NEEDLE) ×2 IMPLANT
NEEDLE MAYO TAPER (NEEDLE)
NEEDLE SCORPION MULTI FIRE (NEEDLE) ×2 IMPLANT
NEEDLE SPNL 18GX3.5 QUINCKE PK (NEEDLE) ×2 IMPLANT
NS IRRIG 1000ML POUR BTL (IV SOLUTION) ×2 IMPLANT
PACK SHOULDER (CUSTOM PROCEDURE TRAY) ×2 IMPLANT
PAD ARMBOARD 7.5X6 YLW CONV (MISCELLANEOUS) ×4 IMPLANT
PAD COLD SHLDR WRAP-ON (PAD) ×1 IMPLANT
PORT APPOLLO RF 90DEGREE MULTI (SURGICAL WAND) IMPLANT
PROBE APOLLO 90XL (SURGICAL WAND) ×1 IMPLANT
RESTRAINT HEAD UNIVERSAL NS (MISCELLANEOUS) ×2 IMPLANT
SLING ARM IMMOBILIZER MED (SOFTGOODS) IMPLANT
SPONGE T-LAP 4X18 ~~LOC~~+RFID (SPONGE) ×4 IMPLANT
STRIP CLOSURE SKIN 1/2X4 (GAUZE/BANDAGES/DRESSINGS) ×3 IMPLANT
SUCTION FRAZIER HANDLE 10FR (MISCELLANEOUS) ×2
SUCTION TUBE FRAZIER 10FR DISP (MISCELLANEOUS) ×1 IMPLANT
SUT ETHILON 3 0 PS 1 (SUTURE) ×1 IMPLANT
SUT MNCRL AB 3-0 PS2 18 (SUTURE) ×2 IMPLANT
SUT VIC AB 0 CT1 27 (SUTURE) ×4
SUT VIC AB 0 CT1 27XBRD ANBCTR (SUTURE) ×1 IMPLANT
SUT VIC AB 1 CT1 36 (SUTURE) ×1 IMPLANT
SUT VIC AB 2-0 CT1 27 (SUTURE) ×2
SUT VIC AB 2-0 CT1 TAPERPNT 27 (SUTURE) ×1 IMPLANT
SUT VICRYL 0 UR6 27IN ABS (SUTURE) ×4 IMPLANT
SYR 20ML LL LF (SYRINGE) ×4 IMPLANT
SYR 3ML LL SCALE MARK (SYRINGE) ×2 IMPLANT
SYR TB 1ML LUER SLIP (SYRINGE) ×2 IMPLANT
TOWEL GREEN STERILE (TOWEL DISPOSABLE) ×2 IMPLANT
TOWEL GREEN STERILE FF (TOWEL DISPOSABLE) ×2 IMPLANT
TUBING ARTHROSCOPY IRRIG 16FT (MISCELLANEOUS) ×2 IMPLANT

## 2021-08-31 NOTE — Anesthesia Procedure Notes (Signed)
Procedure Name: Intubation ?Date/Time: 08/31/2021 1:59 PM ?Performed by: Michele Rockers, CRNA ?Pre-anesthesia Checklist: Patient identified, Patient being monitored, Timeout performed, Emergency Drugs available and Suction available ?Patient Re-evaluated:Patient Re-evaluated prior to induction ?Oxygen Delivery Method: Circle System Utilized ?Preoxygenation: Pre-oxygenation with 100% oxygen ?Induction Type: IV induction ?Ventilation: Mask ventilation without difficulty ?Laryngoscope Size: Sabra Heck and 2 ?Grade View: Grade I ?Tube type: Oral ?Tube size: 7.0 mm ?Number of attempts: 1 ?Airway Equipment and Method: Stylet ?Placement Confirmation: ETT inserted through vocal cords under direct vision, positive ETCO2 and breath sounds checked- equal and bilateral ?Secured at: 21 cm ?Tube secured with: Tape ?Dental Injury: Teeth and Oropharynx as per pre-operative assessment  ? ? ? ? ?

## 2021-08-31 NOTE — H&P (Signed)
[name] [name] is an 61 y.o. female.   ?Chief Complaint: Left shoulder pain ?HPI:  [name] [name] is a 61 year old patient with left shoulder pain.  She did have an injection which helped her for about 1 and half weeks and now it is "back to hurting".  Using it hurts.  She does do mopping and sweeping and that hurts her to use that left shoulder for that.  Has a history of prior biceps tenodesis and distal clavicle excision.  MRI scan does show calcific tendinitis as well as adequate decompression of that AC joint.  No definite rotator cuff tear full-thickness but there is some attritional tearing which could also be at play.  She localizes her pain primarily anteriorly.  Takes Naprosyn for symptoms. ? ?Past Medical History:  ?Diagnosis Date  ? Allergic rhinitis 05/09/2006  ? Allergy   ? Anxiety   ? Arthritis   ? Asthma   ? CHF (congestive heart failure) ([place])   ? Chronic back pain   ? Diabetes mellitus without complication ([place])   ? GERD (gastroesophageal reflux disease)   ? Heart murmur   ? as a child  ? Hepatitis C   ? genotype 1b, 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 s/p 12 week course of simeprevir and sofosbuvir between October 2014 and January 2015 with resolution.  ? History of shingles   ? HIV infection ([place])   ? 1994  ? Hyperlipidemia   ? no meds taken now  ? Hypertension   ? Migraine   ? Pituitary microadenoma ([place]) 08/08/2014  ? Pneumonia   ? Prediabetes   ? Refusal of blood transfusions as patient is Jehovah's Witness   ? Secondary adrenal insufficiency ([place]) 06/29/2014  ? Urticaria   ? ? ?Past Surgical History:  ?Procedure Laterality Date  ? BUNIONECTOMY    ? b/l  ? COLECTOMY    ? 2003 for diverticulitis, had colostomy bag and then reversed  ? COLONOSCOPY    ? HAND SURGERY    ? HAND SURGERY Right   ? right thumb surgery Dr Burney Gauze 2021  ? NASAL SINUS SURGERY    ? SHOULDER SURGERY    ? left  ? TONSILLECTOMY    ? ? ?Family History  ?Problem Relation Age of Onset  ? Heart disease Mother   ? Diabetes  Mother   ? Stroke Mother   ? Heart disease Father   ? Stroke Father   ? Diabetes Father   ? Hepatitis Sister   ?     hcv  ? Asthma Sister   ? Allergic rhinitis Sister   ? Stroke Other   ? Colon polyps Brother   ? Renal Disease Brother   ? Cancer Sister   ?     lung  ? Asthma Sister   ? Allergic rhinitis Sister   ? Cancer Maternal Aunt   ? Cancer Maternal Aunt   ? Colon cancer Neg Hx   ? Esophageal cancer Neg Hx   ? Stomach cancer Neg Hx   ? Rectal cancer Neg Hx   ? Angioedema Neg Hx   ? Eczema Neg Hx   ? Urticaria Neg Hx   ? ?Social History:  reports that she quit smoking about 14 years ago. Her smoking use included cigarettes. She has never used smokeless tobacco. She reports that she does not currently use drugs after having used the following drugs: Marijuana. She reports that she does not drink alcohol. ? ?Allergies:  ?Allergies  ?Allergen Reactions  ?  Acetaminophen Other (See Comments)  ?  Inflamed liver, hospitalized ? ?  ? Morphine Sulfate Hives and Shortness Of Breath  ? Triamterene Hives  ? Aspirin-Caffeine Other (See Comments)  ?  liver damage; upset stomach  ? Dyazide [Hydrochlorothiazide W-Triamterene] Hives  ? Jardiance [Empagliflozin] Diarrhea  ?  (Jardiance) stomach ache ?  ? Lisinopril   ?  Cough   ? Metformin Hcl Er Other (See Comments)  ?   upset stomach ?  ? Topamax [Topiramate] Other (See Comments)  ?  Vision disturbances.  ? Citalopram Itching and Rash  ? Emtricitabine-Tenofovir Df Rash  ?   Descovy  ? Losartan Nausea Only and Rash  ?  Pt had rash, worsening dizziness, and nausea after starting losartan, which improved after stopping losartan  ? Triamterene-Hctz Rash  ? ? ?Medications Prior to Admission  ?Medication Sig Dispense Refill  ? amLODipine (NORVASC) 10 MG tablet Take 1 tablet (10 mg total) by mouth daily. 90 tablet 4  ? atorvastatin (LIPITOR) 10 MG tablet TAKE HALF TABLET BY MOUTH DAILY 90 tablet 10  ? bictegravir-emtricitabine-tenofovir AF (BIKTARVY) 50-200-25 MG TABS tablet Take 1  tablet by mouth daily. 30 tablet 11  ? buPROPion (WELLBUTRIN XL) 300 MG 24 hr tablet TAKE ONE TABLET BY MOUTH DAILY (Patient taking differently: Take 300 mg by mouth every evening.) 90 tablet 4  ? cetirizine (ZYRTEC) 10 MG tablet Take 2 tablets (20 mg total) by mouth 2 (two) times daily. 1 tablet daily (Patient taking differently: Take 10 mg by mouth in the morning.) 120 tablet 5  ? chlorhexidine (PERIDEX) 0.12 % solution Use as directed 15 mLs in the mouth or throat 2 (two) times daily. 120 mL 2  ? cholecalciferol (VITAMIN D) 25 MCG (1000 UNIT) tablet TAKE ONE TABLET BY MOUTH DAILY 90 tablet 0  ? cyclobenzaprine (FLEXERIL) 10 MG tablet Take 1 tablet (10 mg total) by mouth 3 (three) times daily as needed for muscle spasms. 30 tablet 0  ? famotidine (PEPCID) 40 MG tablet Take 1 tablet (40 mg total) by mouth at bedtime. 30 tablet 5  ? gabapentin (NEURONTIN) 100 MG capsule Take 1 capsule (100 mg total) by mouth 3 (three) times daily. (Patient taking differently: Take 200 mg by mouth 3 (three) times daily.) 90 capsule 5  ? hydrocortisone (CORTEF) 5 MG tablet Take 2.5-5 mg by mouth See admin instructions. Take 1 tablet (5 mg) by mouth in the morning (scheduled) & may taken an additional 0.5 tablet (2.5 mg) by mouth in the evening if needed.    ? liraglutide (VICTOZA) 18 MG/3ML SOPN Inject 1.2 mg into the skin every evening.    ? metoprolol tartrate (LOPRESSOR) 25 MG tablet Take 1 tablet (25 mg total) by mouth 2 (two) times daily. 180 tablet 4  ? montelukast (SINGULAIR) 10 MG tablet Take 1 tablet (10 mg total) by mouth at bedtime. 30 tablet 5  ? nortriptyline (PAMELOR) 10 MG capsule Take 2 capsules (20 mg total) by mouth at bedtime. 180 capsule 4  ? pantoprazole (PROTONIX) 40 MG tablet Take 1 tablet (40 mg total) by mouth 2 (two) times daily. 180 tablet 1  ? potassium chloride SA (KLOR-CON) 20 MEQ tablet Take 20 mEq by mouth in the morning.    ? RESTASIS 0.05 % ophthalmic emulsion Place 1 drop into both eyes 2 (two) times  daily.    ? sodium chloride (OCEAN) 0.65 % SOLN nasal spray PLACE 1 SPRAY INTO BOTH NOSTRILS AS NEEDED FOR CONGESTION. 3 Bottle 0  ?  SYMBICORT 160-4.5 MCG/ACT inhaler Inhale 2 puffs into the lungs 2 (two) times daily. 10.2 g 5  ? traMADol (ULTRAM) 50 MG tablet Take 1 tablet (50 mg total) by mouth daily as needed. 20 tablet 0  ? triamcinolone (NASACORT) 55 MCG/ACT AERO nasal inhaler 1 spray each nostril 2 times per day 16.5 g 5  ? albuterol (VENTOLIN HFA) 108 (90 Base) MCG/ACT inhaler Inhale 2 puffs into the lungs every 4 (four) hours as needed for wheezing or shortness of breath. 18 g 2  ? diphenoxylate-atropine (LOMOTIL) 2.5-0.025 MG tablet Take 1 tablet by mouth 4 (four) times daily as needed for diarrhea or loose stools. 30 tablet 0  ? fluconazole (DIFLUCAN) 150 MG tablet Take 1 tablet (150 mg total) by mouth daily. 3 tablet 1  ? nitroGLYCERIN (NITROSTAT) 0.4 MG SL tablet Place 1 tablet (0.4 mg total) under the tongue every 5 (five) minutes as needed for chest pain. 25 tablet 1  ? promethazine (PHENERGAN) 25 MG tablet Take 1 tablet (25 mg total) by mouth every 6 (six) hours as needed for nausea or vomiting. 30 tablet 3  ? SUMAtriptan (IMITREX) 25 MG tablet Take 1 tablet (25 mg total) by mouth every 2 (two) hours as needed for migraine. May repeat in 2 hours if headache persists or recurs. 12 tablet 6  ? ? ?Results for orders placed or performed during the hospital encounter of 08/31/21 (from the past 48 hour(s))  ?Glucose, capillary     Status: Abnormal  ? Collection Time: 08/31/21 10:15 AM  ?Result Value Ref Range  ? Glucose-Capillary 49 (L) 70 - 99 mg/dL  ?  Comment: Glucose reference range applies only to samples taken after fasting for at least 8 hours.  ? ?*Note: Due to a large number of results and/or encounters for the requested time period, some results have not been displayed. A complete set of results can be found in Results Review.  ? ?No results found. ? ?Review of Systems  ?Musculoskeletal:  Positive  for arthralgias.  ?All other systems reviewed and are negative. ? ?Blood pressure (!) 143/87, pulse 87, temperature 97.9 ?F (36.6 ?C), resp. rate 18, height '5\' 1"'$  (1.549 m), weight 82.6 kg, SpO2 95 %. ?Ph

## 2021-08-31 NOTE — Op Note (Signed)
NAME: [name] [name], [name] L. ?MEDICAL RECORD NO: [id number] ?ACCOUNT NO: [id number] ?DATE OF BIRTH: 21-Mar-1961 ?FACILITY: [facility] ?LOCATION: [facility] ?PHYSICIAN: Yetta Barre. Marlou Sa, MD ? ?Operative Report  ? ?DATE OF PROCEDURE: 08/31/2021 ? ?PREOPERATIVE DIAGNOSES:  Left shoulder rotator cuff tendinitis, calcific tendinitis and possible rotator cuff tear. ? ?POSTOPERATIVE DIAGNOSIS:  Left shoulder calcific tendinitis which is mild with 90% thickness tear of the supraspinatus. ? ?PROCEDURE:  Left shoulder arthroscopy with debridement and mini open rotator cuff tear repair. ? ?SURGEON:  Yetta Barre. Marlou Sa, MD ? ?ASSISTANT:  Annie Main. ? ?INDICATIONS:  The patient is a 61 year old patient with left shoulder pain of long duration.  She has had multiple injections.  Has significant tendinosis of the supraspinatus tendon and what appears to be some calcific tendinitis on MRI scan.  She  ?presents now for operative management after explanation of risks and benefits. ? ?DESCRIPTION OF PROCEDURE:  The patient was brought to the operating room where general endotracheal anesthesia was induced.  Preoperative antibiotics were administered.  Timeout was called.  The patient was placed in the beach chair position with the  ?head in neutral position.  Left shoulder, arm and hand prescrubbed with alcohol and Betadine, allowed to air dry, prepped with DuraPrep solution and draped in sterile manner.  Ioban used to seal the operative field.  Timeout was called.  Posterior portal ? created 2 cm medial and inferior to the posterolateral margin of acromion.  Anterior portal created under direct visualization.  Diagnostic arthroscopy was performed.  The patient did have near full-thickness tear of the supraspinatus.  Glenohumeral  ?articular surfaces intact.  Superior labrum, prior biceps tenodesis performed.  Anterior inferior, posterior inferior glenohumeral ligaments intact.  Scope was then placed in the subacromial space.  Lateral portal created.   The bursectomy performed.   ?Some small amount of calcification was present and unroofed with a probe, but in general, the main finding was a 1.5 x 1.5 cm near full thickness rotator cuff tear of the supraspinatus.  Primarily bursal-sided tear but also some articular-sided tear.  At ? this time, instruments were removed.  Ioban used to cover the operative field.  Portals were closed using 3-0 nylon.  Incision made off the anterolateral margin of the acromion.  Deltoid split a measured distance of 4 cm from the anterolateral margin of ? the acromion, marked with a #1 Vicryl suture.  Self-retaining retractor placed.  The rotator cuff tear was visualized.  At this time, four 0 Vicryl suture anchors were placed in Mason-Allen locking fashion through the edge of the tendon tear.  These  ?were then utilized to mobilize the tendon.  Footprint prepared down to bleeding bone.  Two Arthrex suture anchors placed and tied.  The suture tapes were then crossed and brought in with the 4 Vicryl sutures and split and placed in the SwiveLocks into  ?the metaphyseal bone.  The bone quality was poor.  The initial SwiveLock placement resulted in an anchor that was pulling through the bone.  A larger 6.25 SwiveLock anchor was used in a different spot.  That hole in the metaphysis was then bone grafted  ?using allograft bone chips.  In general, the bone quality was fairly poor in the metaphyseal region and it did take a lot of bone chips.  Overall, stable repair and construct was achieved.  Acromioplasty also performed with a rasp.  Thorough irrigation  ?was performed.  Deltoid split closed using #1 Vicryl suture followed by interrupted inverted 0 Vicryl suture, 2-0  Vicryl suture, and 3-0 Monocryl.  The patient transferred to the recovery room in stable condition.  Luke's assistance was required for  ?opening, closing, limb positioning during arthroscopy, retraction of the deltoid during rotator cuff repair along with suture management  and bone grafting and closing.  His assistance was a medical necessity at all times. ? ? ?SHW ?D: 08/31/2021 5:08:28 pm T: 08/31/2021 10:36:00 pm  ?JOB: [id number]/ [id number]  ?

## 2021-08-31 NOTE — Brief Op Note (Addendum)
   08/31/2021  5:02 PM  PATIENT:  [name] [name]  61 y.o. female  PRE-OPERATIVE DIAGNOSIS:  left shoulder calcific tendonitis, possible rotator cuff tear  POST-OPERATIVE DIAGNOSIS:  left shoulder calcific tendonitis,  rotator cuff tear  PROCEDURE:  Procedure(s): LEFT SHOULDER ARTHROSCOPY, DEBRIDEMENT,  ROTATOR CUFF TEAR REPAIR  SURGEON:  Surgeon(s): Meredith Pel, MD  ASSISTANT: magnant pa  ANESTHESIA:   general  EBL: 25 ml    Total I/O In: 1100 [I.V.:1000; IV Piggyback:100] Out: 100 [Blood:100]  BLOOD ADMINISTERED: none  DRAINS: none   LOCAL MEDICATIONS USED:  none  SPECIMEN:  No Specimen  COUNTS:  YES  TOURNIQUET:  * No tourniquets in log *  DICTATION: .Other Dictation: Dictation Number [id number]  PLAN OF CARE: Discharge to home after PACU  PATIENT DISPOSITION:  PACU - hemodynamically stable  DIAGNOSES: Left shoulder, chronic rotator cuff tear and shoulder synovitis with supraspinatus calcific tendinitis and glenohumeral arthritis .  POST-OPERATIVE DIAGNOSIS: same  PROCEDURE: Open repair chronic rotator cuff tear - 26378 Arthroscopic limited debridement - 58850   OPERATIVE FINDING: Exam under anesthesia: Normal Articular space: Normal Chondral surfaces:  Grade 2-3 glenohumeral joint arthritis present both the humeral head and glenoid articular surface Biceps:  Prior tenodesis Subscapularis: Intact  Supraspinatus: Incomplete tear  Infraspinatus: Complete tear

## 2021-08-31 NOTE — Transfer of Care (Signed)
Immediate Anesthesia Transfer of Care Note ? ?Patient: [name] [name] ? ?Procedure(s) Performed: LEFT SHOULDER ARTHROSCOPY, DEBRIDEMENT,  ROTATOR CUFF TEAR REPAIR (Left: Shoulder) ? ?Patient Location: PACU ? ?Anesthesia Type:General ? ?Level of Consciousness: awake and oriented ? ?Airway & Oxygen Therapy: Patient Spontanous Breathing and Patient connected to nasal cannula oxygen ? ?Post-op Assessment: Report given to RN and Post -op Vital signs reviewed and stable ? ?Post vital signs: Reviewed and stable ? ?Last Vitals:  ?Vitals Value Taken Time  ?BP 124/80 08/31/21 1652  ?Temp    ?Pulse 81 08/31/21 1652  ?Resp 17 08/31/21 1652  ?SpO2 100 % 08/31/21 1652  ?Vitals shown include unvalidated device data. ? ?Last Pain:  ?Vitals:  ? 08/31/21 1042  ?PainSc: 0-No pain  ?   ? ?  ? ?Complications: No notable events documented. ?

## 2021-08-31 NOTE — Anesthesia Procedure Notes (Signed)
Anesthesia Regional Block: Interscalene brachial plexus block  ? ?Pre-Anesthetic Checklist: , timeout performed,  Correct Patient, Correct Site, Correct Laterality,  Correct Procedure, Correct Position, site marked,  Risks and benefits discussed,  Surgical consent,  Pre-op evaluation,  At surgeon's request and post-op pain management ? ?Laterality: Left and Upper ? ?Prep: chloraprep     ?  ?Needles:  ?Injection technique: Single-shot ? ?  ? ? ?Needle Length: 5cm  ?Needle Gauge: 22  ? ? ? ?Additional Needles: ?Arrow? StimuQuik? ECHO Echogenic Stimulating PNB Needle ? ?Procedures:,,,, ultrasound used (permanent image in chart),,    ?Narrative:  ?Start time: 08/31/2021 11:49 AM ?End time: 08/31/2021 11:59 AM ?Injection made incrementally with aspirations every 5 mL. ? ?Performed by: Personally  ?Anesthesiologist: Oleta Mouse, MD ? ? ? ? ?

## 2021-09-01 ENCOUNTER — Other Ambulatory Visit: Payer: Self-pay | Admitting: Surgical

## 2021-09-01 ENCOUNTER — Encounter (HOSPITAL_COMMUNITY): Payer: Self-pay | Admitting: Orthopedic Surgery

## 2021-09-01 ENCOUNTER — Telehealth: Payer: Self-pay | Admitting: Orthopedic Surgery

## 2021-09-01 MED ORDER — TRAMADOL HCL 50 MG PO TABS: 100.0000 mg | ORAL_TABLET | Freq: Four times a day (QID) | ORAL | 0 refills | Status: DC | PRN

## 2021-09-01 NOTE — Anesthesia Postprocedure Evaluation (Signed)
Anesthesia Post Note ? ?Patient: [name] [name] ? ?Procedure(s) Performed: LEFT SHOULDER ARTHROSCOPY, DEBRIDEMENT,  ROTATOR CUFF TEAR REPAIR (Left: Shoulder) ? ?  ? ?Patient location during evaluation: PACU ?Anesthesia Type: General ?Level of consciousness: awake and alert ?Pain management: pain level controlled ?Vital Signs Assessment: post-procedure vital signs reviewed and stable ?Respiratory status: spontaneous breathing, nonlabored ventilation and respiratory function stable ?Cardiovascular status: stable and blood pressure returned to baseline ?Anesthetic complications: no ? ? ?No notable events documented. ? ?Last Vitals:  ?Vitals:  ? 08/31/21 1707 08/31/21 1721  ?BP: 130/82 132/80  ?Pulse: 84 78  ?Resp: 19 14  ?Temp:  (!) 36.1 ?C  ?SpO2: 91% 96%  ?  ?Last Pain:  ?Vitals:  ? 08/31/21 1042  ?PainSc: 0-No pain  ? ? ?  ?  ?  ?  ?  ?  ? ?Audry Pili ? ? ? ? ?

## 2021-09-01 NOTE — Telephone Encounter (Signed)
Removed from system. ?

## 2021-09-01 NOTE — Telephone Encounter (Signed)
Pt called requesting her tramadol medication be switched to [facility] that delivers. Please call [facility] and cancel and send today so they can deliver today. Pt states she had surgery yesterday. Pt phone number is [phone]. ?

## 2021-09-01 NOTE — Telephone Encounter (Signed)
Sent in

## 2021-09-01 NOTE — Telephone Encounter (Signed)
Patient would like [facility] and [facility] taken off of preferred pharmacy, system will not let me edit ?

## 2021-09-07 ENCOUNTER — Encounter: Payer: Self-pay | Admitting: Allergy and Immunology

## 2021-09-07 ENCOUNTER — Ambulatory Visit (INDEPENDENT_AMBULATORY_CARE_PROVIDER_SITE_OTHER): Payer: Medicaid Other | Admitting: Allergy and Immunology

## 2021-09-07 VITALS — BP 138/88 | HR 83 | Temp 98.5°F | Resp 16 | Ht 61.0 in | Wt 178.0 lb

## 2021-09-07 DIAGNOSIS — J3089 Other allergic rhinitis: Secondary | ICD-10-CM

## 2021-09-07 DIAGNOSIS — K219 Gastro-esophageal reflux disease without esophagitis: Secondary | ICD-10-CM | POA: Diagnosis not present

## 2021-09-07 DIAGNOSIS — L299 Pruritus, unspecified: Secondary | ICD-10-CM | POA: Diagnosis not present

## 2021-09-07 DIAGNOSIS — J4540 Moderate persistent asthma, uncomplicated: Secondary | ICD-10-CM

## 2021-09-07 MED ORDER — SYMBICORT 160-4.5 MCG/ACT IN AERO: 2.0000 | INHALATION_SPRAY | Freq: Two times a day (BID) | RESPIRATORY_TRACT | 5 refills | Status: DC

## 2021-09-07 MED ORDER — NEURONTIN 100 MG PO CAPS: 200.0000 mg | ORAL_CAPSULE | Freq: Three times a day (TID) | ORAL | 3 refills | Status: DC

## 2021-09-07 MED ORDER — MONTELUKAST SODIUM 10 MG PO TABS: 10.0000 mg | ORAL_TABLET | Freq: Every evening | ORAL | 5 refills | Status: DC

## 2021-09-07 MED ORDER — FAMOTIDINE 40 MG PO TABS: 40.0000 mg | ORAL_TABLET | Freq: Every evening | ORAL | 5 refills | Status: DC

## 2021-09-07 MED ORDER — ALBUTEROL SULFATE HFA 108 (90 BASE) MCG/ACT IN AERS: 2.0000 | INHALATION_SPRAY | RESPIRATORY_TRACT | 2 refills | Status: DC | PRN

## 2021-09-07 MED ORDER — CETIRIZINE HCL 10 MG PO TABS: 20.0000 mg | ORAL_TABLET | Freq: Two times a day (BID) | ORAL | 5 refills | Status: DC | PRN

## 2021-09-07 MED ORDER — TRIAMCINOLONE ACETONIDE 55 MCG/ACT NA AERO: 1.0000 | INHALATION_SPRAY | Freq: Two times a day (BID) | NASAL | 5 refills | Status: DC

## 2021-09-07 MED ORDER — PANTOPRAZOLE SODIUM 40 MG PO TBEC: 40.0000 mg | DELAYED_RELEASE_TABLET | Freq: Two times a day (BID) | ORAL | 1 refills | Status: DC

## 2021-09-07 NOTE — Patient Instructions (Addendum)
?  1.  Continue to Treat and prevent inflammation: ? ? A.  Montelukast 10 mg - 1 tablet 1 time per day ? B.  Symbicort 160 - 2 inhalations 1-2 times per day ? C.  Nasacort -1 spray each nostril 1-2 times per day ? ?2.  Continue to Treat and prevent reflux: ? ? A.  Protonix 40 mg 2 times per day ? B.  Famotidine 40 mg in p.m.   ? ?3.  Continue to treat and prevent itchiness:  ? ? A. cetirizine 10 mg -1-2 tablets 1-2 times a day  ? B. Gabapentin 100 mg - 1-2 tablet 3 times a day ? ?4.  If needed: ? ? A.  OTC Benadryl ? B.  OTC nasal saline ? C.  Pro Air HFA 2 puffs every 4-6 hours ? D.  Nasal saline ? ?5. Return to clinic in 6 months or earlier if problem ? ? ?

## 2021-09-07 NOTE — Progress Notes (Signed)
? ?[facility] ? ? ?Follow-up Note  ? ?Referring Provider: [name], MD ?Primary Provider: [name], MD ?Date of Office Visit: 09/07/2021 ? ?Subjective:  ? ?[name] [name] (DOB: 1960-05-10) is a 61 y.o. female who returns to the [facility] on 09/07/2021 in re-evaluation of the following: ? ?HPI: Tationna returns to this clinic in evaluation of asthma, allergic rhinitis, LPR, and pruritic disorder.  Her last visit to this clinic was 02 March 2021. ? ?Her airway issue has been under excellent control and she rarely uses a short acting bronchodilator.  She is now using Symbicort as her Trelegy prescription was denied by her insurance company.  She uses Symbicort mostly 1 time per day.  She has not required a systemic steroid for an asthma issue ? ?Her nose is doing very well using Nasacort on a consistent basis.  She did require an antibiotic for an episode of otitis media last month but otherwise has not required any antibiotic for any upper airway issue. ? ?Her reflux is really doing very well on her current plan. ? ?Her pruritic disorder is under pretty good control while using 100 mg of gabapentin 3 times per day but at nighttime it sometimes does flareup. ? ?She had left shoulder surgery last week.  She is undergoing rehab for this issue. ? ?Allergies as of 09/07/2021   ? ?   Reactions  ? Acetaminophen Other (See Comments)  ? Inflamed liver, hospitalized  ? Morphine Sulfate Hives, Shortness Of Breath  ? Triamterene Hives  ? Aspirin-caffeine Other (See Comments)  ? liver damage; upset stomach  ? Dyazide [hydrochlorothiazide W-triamterene] Hives  ? Jardiance [empagliflozin] Diarrhea  ? (Jardiance) stomach ache  ? Lisinopril   ? Cough   ? Metformin Hcl Er Other (See Comments)  ?  upset stomach  ? Topamax [topiramate] Other (See Comments)  ? Vision disturbances.  ? Citalopram Itching, Rash  ? Emtricitabine-tenofovir Df Rash  ?  Descovy  ?  Losartan Nausea Only, Rash  ? Pt had rash, worsening dizziness, and nausea after starting losartan, which improved after stopping losartan  ? Triamterene-hctz Rash  ? ?  ? ?  ?Medication List  ? ? ?albuterol 108 (90 Base) MCG/ACT inhaler ?Commonly known as: Ventolin HFA ?Inhale 2 puffs into the lungs every 4 (four) hours as needed for wheezing or shortness of breath. ?  ?amLODipine 10 MG tablet ?Commonly known as: NORVASC ?Take 1 tablet (10 mg total) by mouth daily. ?  ?atorvastatin 10 MG tablet ?Commonly known as: LIPITOR ?TAKE HALF TABLET BY MOUTH DAILY ?  ?Biktarvy 50-200-25 MG Tabs tablet ?Generic drug: bictegravir-emtricitabine-tenofovir AF ?Take 1 tablet by mouth daily. ?  ?buPROPion 300 MG 24 hr tablet ?Commonly known as: WELLBUTRIN XL ?TAKE ONE TABLET BY MOUTH DAILY ?  ?cetirizine 10 MG tablet ?Commonly known as: ZYRTEC ?TAKE ONE TABLET BY MOUTH DAILY ?  ?chlorhexidine 0.12 % solution ?Commonly known as: PERIDEX ?Use as directed 15 mLs in the mouth or throat 2 (two) times daily. ?  ?cholecalciferol 25 MCG (1000 UNIT) tablet ?Commonly known as: VITAMIN D ?TAKE ONE TABLET BY MOUTH DAILY ?  ?cyclobenzaprine 10 MG tablet ?Commonly known as: FLEXERIL ?Take 1 tablet (10 mg total) by mouth 3 (three) times daily as needed for muscle spasms. ?  ?diphenoxylate-atropine 2.5-0.025 MG tablet ?Commonly known as: Lomotil ?Take 1 tablet by mouth 4 (four) times daily as needed for diarrhea or loose stools. ?  ?famotidine  40 MG tablet ?Commonly known as: PEPCID ?Take 1 tablet (40 mg total) by mouth at bedtime. ?  ?hydrocortisone 5 MG tablet ?Commonly known as: CORTEF ?Take 2.5-5 mg by mouth See admin instructions. Take 1 tablet (5 mg) by mouth in the morning (scheduled) & may taken an additional 0.5 tablet (2.5 mg) by mouth in the evening if needed. ?  ?liraglutide 18 MG/3ML Sopn ?Commonly known as: VICTOZA ?Inject 1.2 mg into the skin every evening. ?  ?metoprolol tartrate 25 MG tablet ?Commonly known as: LOPRESSOR ?Take 1  tablet (25 mg total) by mouth 2 (two) times daily. ?  ?montelukast 10 MG tablet ?Commonly known as: SINGULAIR ?Take 1 tablet (10 mg total) by mouth at bedtime. ?  ?Neurontin 100 MG capsule ?Generic drug: gabapentin ?TAKE TWO CAPSULES BY MOUTH THREE TIMES A DAY ?  ?nitroGLYCERIN 0.4 MG SL tablet ?Commonly known as: NITROSTAT ?Place 1 tablet (0.4 mg total) under the tongue every 5 (five) minutes as needed for chest pain. ?  ?nortriptyline 10 MG capsule ?Commonly known as: PAMELOR ?Take 2 capsules (20 mg total) by mouth at bedtime. ?  ?pantoprazole 40 MG tablet ?Commonly known as: PROTONIX ?Take 1 tablet (40 mg total) by mouth 2 (two) times daily. ?  ?potassium chloride SA 20 MEQ tablet ?Commonly known as: KLOR-CON M ?Take 20 mEq by mouth in the morning. ?  ?Restasis 0.05 % ophthalmic emulsion ?Generic drug: cycloSPORINE ?Place 1 drop into both eyes 2 (two) times daily. ?  ?sodium chloride 0.65 % Soln nasal spray ?Commonly known as: OCEAN ?PLACE 1 SPRAY INTO BOTH NOSTRILS AS NEEDED FOR CONGESTION. ?  ?SUMAtriptan 25 MG tablet ?Commonly known as: Imitrex ?Take 1 tablet (25 mg total) by mouth every 2 (two) hours as needed for migraine. May repeat in 2 hours if headache persists or recurs. ?  ?Symbicort 160-4.5 MCG/ACT inhaler ?Generic drug: budesonide-formoterol ?INHALE TWO PUFFS INTO THE LUNGS TWO (TWO) TIMES DAILY. ?  ?traMADol 50 MG tablet ?Commonly known as: Ultram ?Take 2 tablets (100 mg total) by mouth every 6 (six) hours as needed. ?  ?triamcinolone 55 MCG/ACT Aero nasal inhaler ?Commonly known as: NASACORT ?1 spray each nostril 2 times per day ?  ? ?Past Medical History:  ?Diagnosis Date  ? Allergic rhinitis 05/09/2006  ? Allergy   ? Anxiety   ? Arthritis   ? Asthma   ? CHF (congestive heart failure) ([place])   ? Chronic back pain   ? Diabetes mellitus without complication ([place])   ? GERD (gastroesophageal reflux disease)   ? Heart murmur   ? as a child  ? Hepatitis C   ? genotype 1b, stage 2 fibrosis in liver biopsy  December 2013. s/p 12 week course of simeprevir and sofosbuvir between October 2014 and January 2015 with resolution.  ? History of shingles   ? HIV infection ([place])   ? 1994  ? Hyperlipidemia   ? no meds taken now  ? Hypertension   ? Migraine   ? Pituitary microadenoma ([place]) 08/08/2014  ? Pneumonia   ? Prediabetes   ? Refusal of blood transfusions as patient is Jehovah's Witness   ? Secondary adrenal insufficiency ([place]) 06/29/2014  ? Urticaria   ? ? ?Past Surgical History:  ?Procedure Laterality Date  ? BUNIONECTOMY    ? b/l  ? COLECTOMY    ? 2003 for diverticulitis, had colostomy bag and then reversed  ? COLONOSCOPY    ? HAND SURGERY    ? HAND SURGERY Right   ?  right thumb surgery Dr Burney Gauze 2021  ? NASAL SINUS SURGERY    ? SHOULDER ARTHROSCOPY WITH OPEN ROTATOR CUFF REPAIR AND DISTAL CLAVICLE ACROMINECTOMY Left 08/31/2021  ? Procedure: LEFT SHOULDER ARTHROSCOPY, DEBRIDEMENT,  ROTATOR CUFF TEAR REPAIR;  Surgeon: Meredith Pel, MD;  Location: [facility];  Service: Orthopedics;  Laterality: Left;  ? SHOULDER SURGERY    ? left  ? TONSILLECTOMY    ? ? ?Review of systems negative except as noted in HPI / PMHx or noted below: ? ?Review of Systems  ?Constitutional: Negative.   ?HENT: Negative.    ?Eyes: Negative.   ?Respiratory: Negative.    ?Cardiovascular: Negative.   ?Gastrointestinal: Negative.   ?Genitourinary: Negative.   ?Musculoskeletal: Negative.   ?Skin: Negative.   ?Neurological: Negative.   ?Endo/Heme/Allergies: Negative.   ?Psychiatric/Behavioral: Negative.    ? ? ?Objective:  ? ?Vitals:  ? 09/07/21 1048  ?BP: 138/88  ?Pulse: 83  ?Resp: 16  ?Temp: 98.5 ?F (36.9 ?C)  ?SpO2: 98%  ? ?Height: '5\' 1"'$  (154.9 cm)  ?Weight: 178 lb (80.7 kg)  ? ?Physical Exam ?Constitutional:   ?   Appearance: She is not diaphoretic.  ?HENT:  ?   Head: Normocephalic.  ?   Right Ear: Ear canal and external ear normal. Tympanic membrane is perforated.  ?   Left Ear: Ear canal and external ear normal. Tympanic membrane is perforated.  ?    Nose: Nose normal. No mucosal edema or rhinorrhea.  ?   Mouth/Throat:  ?   Pharynx: Uvula midline. No oropharyngeal exudate.  ?Eyes:  ?   Conjunctiva/sclera: Conjunctivae normal.  ?Neck:  ?   Thyroid: No

## 2021-09-08 ENCOUNTER — Ambulatory Visit (INDEPENDENT_AMBULATORY_CARE_PROVIDER_SITE_OTHER): Payer: Medicaid Other | Admitting: Orthopedic Surgery

## 2021-09-08 ENCOUNTER — Encounter: Payer: Self-pay | Admitting: Allergy and Immunology

## 2021-09-08 ENCOUNTER — Encounter: Payer: Self-pay | Admitting: Orthopedic Surgery

## 2021-09-08 DIAGNOSIS — M79602 Pain in left arm: Secondary | ICD-10-CM

## 2021-09-08 MED ORDER — IBUPROFEN 800 MG PO TABS: ORAL_TABLET | ORAL | 0 refills | Status: DC

## 2021-09-08 MED ORDER — METHOCARBAMOL 500 MG PO TABS
500.0000 mg | ORAL_TABLET | Freq: Three times a day (TID) | ORAL | 0 refills | Status: DC | PRN
Start: 2021-09-08 — End: 2022-06-21

## 2021-09-08 NOTE — Progress Notes (Signed)
? ?Post-Op Visit Note ?  ?Patient: [name] [name]           ?Date of Birth: 1960/09/03           ?MRN: [id number] ?Visit Date: 09/08/2021 ?PCP: [name], MD ? ? ?Assessment & Plan: ? ?Chief Complaint: No chief complaint on file. ? ?Visit Diagnoses:  ?1. Left arm pain   ? ? ?Plan: [name] [name] is a 61 year old patient who is now about a week out left shoulder arthroscopy debridement rotator cuff tear repair.  CPM at 110.  Using [employer] 3 times a day.  Tramadol makes her sick.  On exam she has good passive range of motion and the incisions are intact.  Plan is to start physical therapy 1 week from this coming Monday for active assistive range of motion and passive range of motion.  Portals sutures DC'd today.  Ibuprofen prescribed.  Refill Robaxin.  3-week return for clinical recheck.  Okay to discontinue sling when she starts physical therapy a week from this coming Monday.  No lifting with left arm though out of the sling. ? ?Follow-Up Instructions: Return in about 3 weeks (around 09/29/2021).  ? ?Orders:  ?Orders Placed This Encounter  ?Procedures  ? Ambulatory referral to Physical Therapy  ? ?Meds ordered this encounter  ?Medications  ? methocarbamol (ROBAXIN) 500 MG tablet  ?  Sig: Take 1 tablet (500 mg total) by mouth every 8 (eight) hours as needed for muscle spasms.  ?  Dispense:  30 tablet  ?  Refill:  0  ? ibuprofen (ADVIL) 800 MG tablet  ?  Sig: 1 po bid prn pain  ?  Dispense:  60 tablet  ?  Refill:  0  ? ? ?Imaging: ?No results found. ? ?PMFS History: ?Patient Active Problem List  ? Diagnosis Date Noted  ? Acute otitis media with perforated tympanic membrane, right 08/03/2021  ? Impingement syndrome of left shoulder 07/01/2021  ? Left leg swelling 07/09/2020  ? Grief reaction 06/08/2020  ? Arm pain 02/10/2020  ? Dizziness on standing 12/27/2019  ? 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 Osteoarthritis of thumb, right 11/29/2018  ? Chronic migraine w/o aura w/o status migrainosus, not  intractable 08/27/2018  ? Disorder of left eustachian tube 05/19/2017  ? Vertigo of central origin 01/23/2017  ? Central perforation of tympanic membrane of left ear 12/09/2016  ? Eustachian tube dysfunction, bilateral 06/15/2016  ? Ear pain, right 06/15/2016  ? 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 BPPV (benign paroxysmal positional vertigo) 10/02/2015  ? Liver fibrosis ([place]) 12/04/2014  ? Pituitary microadenoma ([place]) 08/08/2014  ? 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 Vitamin D deficiency 06/29/2014  ? Secondary adrenal insufficiency ([place]) 06/29/2014  ? History of tympanostomy tube placement 02/07/2014  ? Refusal of blood transfusions as patient is Jehovah's Witness 11/18/2013  ? Hepatitis C virus infection cured after antiviral drug therapy 03/16/2012  ? Health care maintenance 12/15/2011  ? Opioid dependence ([place]) 10/05/2010  ? HTN (hypertension) 10/05/2010  ? Hyperlipidemia associated with type 2 diabetes mellitus ([place]) 10/23/2008  ? HIV disease ([place]) 05/09/2006  ? Depression 05/09/2006  ? Allergic rhinitis 05/09/2006  ? GERD 05/09/2006  ? ?Past Medical History:  ?Diagnosis Date  ? Allergic rhinitis 05/09/2006  ? Allergy   ? Anxiety   ? Arthritis   ? Asthma   ? CHF (congestive heart failure) ([place])   ? Chronic back pain   ? Diabetes mellitus without complication (  [facility])   ? GERD (gastroesophageal reflux disease)   ? Heart murmur   ? as a child  ? Hepatitis C   ? genotype 1b, 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 s/p 12 week course of simeprevir and sofosbuvir between October 2014 and January 2015 with resolution.  ? History of shingles   ? HIV infection ([place])   ? 1994  ? Hyperlipidemia   ? no meds taken now  ? Hypertension   ? Migraine   ? Pituitary microadenoma ([place]) 08/08/2014  ? Pneumonia   ? Prediabetes   ? Refusal of blood transfusions as patient is Jehovah's Witness   ? Secondary adrenal insufficiency ([place]) 06/29/2014  ? Urticaria   ?   ?Family History  ?Problem Relation Age of Onset  ? Heart disease Mother   ? Diabetes Mother   ? Stroke Mother   ? Heart disease Father   ? Stroke Father   ? Diabetes Father   ? Hepatitis Sister   ?     hcv  ? Asthma Sister   ? Allergic rhinitis Sister   ? Stroke Other   ? Colon polyps Brother   ? Renal Disease Brother   ? Cancer Sister   ?     lung  ? Asthma Sister   ? Allergic rhinitis Sister   ? Cancer Maternal Aunt   ? Cancer Maternal Aunt   ? Colon cancer Neg Hx   ? Esophageal cancer Neg Hx   ? Stomach cancer Neg Hx   ? Rectal cancer Neg Hx   ? Angioedema Neg Hx   ? Eczema Neg Hx   ? Urticaria Neg Hx   ?  ?Past Surgical History:  ?Procedure Laterality Date  ? BUNIONECTOMY    ? b/l  ? COLECTOMY    ? 2003 for diverticulitis, had colostomy bag and then reversed  ? COLONOSCOPY    ? HAND SURGERY    ? HAND SURGERY Right   ? right thumb surgery Dr Burney Gauze 2021  ? NASAL SINUS SURGERY    ? SHOULDER ARTHROSCOPY WITH OPEN ROTATOR CUFF REPAIR AND DISTAL CLAVICLE ACROMINECTOMY Left 08/31/2021  ? Procedure: LEFT SHOULDER ARTHROSCOPY, DEBRIDEMENT,  ROTATOR CUFF TEAR REPAIR;  Surgeon: Meredith Pel, MD;  Location: [facility];  Service: Orthopedics;  Laterality: Left;  ? SHOULDER SURGERY    ? left  ? TONSILLECTOMY    ? ?Social History  ? ?Occupational History  ? Occupation: Disabled  ?Tobacco Use  ? Smoking status: Former  ?  Years: 20.00  ?  Types: Cigarettes  ?  Quit date: 04/26/2007  ?  Years since quitting: 14.3  ? Smokeless tobacco: Never  ? Tobacco comments:  ?  QUIT 2009  ?Vaping Use  ? Vaping Use: Never used  ?Substance and Sexual Activity  ? Alcohol use: No  ?  Alcohol/week: 0.0 standard drinks  ? Drug use: Not Currently  ?  Types: Marijuana  ?  Comment: cannabis in the past  ? Sexual activity: Not Currently  ?  Partners: Male  ?  Birth control/protection: Condom  ?  Comment: declined condoms  ? ? ? ?

## 2021-09-10 ENCOUNTER — Other Ambulatory Visit: Payer: Self-pay | Admitting: Obstetrics and Gynecology

## 2021-09-10 NOTE — Patient Instructions (Addendum)
Hi [name] [name] am glad you are recuperating okay-have a nice weekend!!  [name] [name] was given information about Medicaid Managed Care team care coordination services as a part of their [place] Medicaid benefit. [name] [name] verbally consented to engagement with the [facility] Managed Care team.   If you are experiencing a medical emergency, please call 911 or report to your local emergency department or urgent care.   If you have a non-emergency medical problem during routine business hours, please contact your provider's office and ask to speak with a nurse.   For questions related to your [facility], please call: [phone] or visit the homepage here: [email or web address]  If you would like to schedule transportation through your [facility], please call the following number at least 2 days in advance of your appointment: [phone].  Rides for urgent appointments can also be made after hours by calling Member Services.  Call the [place] at [phone], at any time, 24 hours a day, 7 days a week. If you are in danger or need immediate medical attention call 911.  If you would like help to quit smoking, call 1-800-QUIT-NOW 9857676631) OR Espaol: 1-855-Djelo-Ya (7-564-332-9518) o para ms informacin haga clic aqu or Text READY to 200-400 to register via text  [name] [name] - following are the goals we discussed in your visit today:   Goals Addressed             This Visit's Progress    HEMOGLOBIN A1C < 7.0       Protect My Health       Timeframe:  Long-Range Goal Priority:  Medium Start Date:      05/07/20                       Expected End Date: ongoing             Follow Up Date: 10/13/21   - schedule appointment for vaccines needed due to my age or health - schedule recommended health tests (blood work, mammogram,  colonoscopy, pap test) - schedule and keep appointment for annual check-up    Why is this important?   Screening tests can find diseases early when they are easier to treat.  Your doctor or nurse will talk with you about which tests are important for you.  Getting shots for common diseases like the flu and shingles will help prevent them.     09/10/21:  patient with recent shoulder surgery-to have ear surgery when she can.  To start PT soon.   Patient verbalizes understanding of instructions and care plan provided today and agrees to view in [place]. Active MyChart status and patient understanding of how to access instructions and care plan via MyChart confirmed with patient.     The Managed Medicaid care management team will reach out to the patient again over the next 30 business  days.  The  Patient  has been provided with contact information for the Managed Medicaid care management team and has been advised to call with any health related questions or concerns.   Aida Raider RN, BSN [place] Management Coordinator - Managed Medicaid High Risk [phone]   Following is a copy of your plan of care:  Care Plan : General Plan of Care (Adult)  Updates made by Gayla Medicus, RN since 09/10/2021 12:00 AM     Problem: Health Promotion  or Disease Self-Management (General Plan of Care)   Priority: Medium  Onset Date: 08/10/2020     Long-Range Goal: Self-Management Plan Developed   Start Date: 05/07/2020  Expected End Date: 12/11/2021  Recent Progress: On track  Priority: Medium  Note:   Current Barriers:  Chronic Disease Management support and education needs.  09/10/21:  Patient recovering from left shoulder surgery on 08/31/21-to start PT soon.  Also, to have left ear surgery when she can.  Patient in need of transportation-given [place] transportation phone number with instruction.  BP and CBG WNL  Nurse Case Manager Clinical Goal(s):  Over the next 30  days, patient will attend all scheduled medical appointments  Interventions:  Inter-disciplinary care team collaboration (see longitudinal plan of care) Evaluation of current treatment plan and patient's adherence to plan as established by provider. Reviewed medications with patient. Collaborated with pharmacy regarding medications. Discussed plans with patient for ongoing care management follow up and provided patient with direct contact information for care management team Reviewed scheduled/upcoming provider appointments. Pharmacy referral for medication review-completed. Patient provided with [facility] information.  Patient Goals/Self-Care Activities Over the next 30 days, patient will:  -Attends all scheduled provider appointments Calls pharmacy for medication refills Calls provider office for new concerns or questions  Follow Up Plan: The Managed Medicaid care management team will reach out to the patient again over the next 30 days.  The patient has been provided with contact information for the Managed Medicaid care management team and has been advised to call with any health related questions or concerns.

## 2021-09-10 NOTE — Patient Outreach (Signed)
Medicaid Managed Care   Nurse Care Manager Note  09/10/2021 Name:  [name] [name] MRN:  [id number] DOB:  November 06, 1960  [name] [name] is an 61 y.o. year old female who is a primary patient of [name] Dame, MD.  [facility] Managed Care Coordination team was consulted for assistance with:    Chronic healthcare management needs, HTN, HIV, DM, asthma, GERD, anxiety/depression, HTN, osteoarthritis, hearing loss  [name] was given information about Medicaid Managed Care Coordination team services today. [name] [name] Patient agreed to services and verbal consent obtained.  Engaged with patient by telephone for follow up visit in response to provider referral for case management and/or care coordination services.   Assessments/Interventions:  Review of past medical history, allergies, medications, health status, including review of consultants reports, laboratory and other test data, was performed as part of comprehensive evaluation and provision of chronic care management services.  SDOH (Social Determinants of Health) assessments and interventions performed: SDOH Interventions    Flowsheet Row Most Recent Value  SDOH Interventions   Financial Strain Interventions Intervention Not Indicated  Physical Activity Interventions Other (Comments)  [patient with recent shoulder surgery-unable to perforem that kind of exercise right now]     Care Plan  Allergies  Allergen Reactions   Acetaminophen Other (See Comments)    Inflamed liver, hospitalized     Morphine Sulfate Hives and Shortness Of Breath   Triamterene Hives   Aspirin-Caffeine Other (See Comments)    liver damage; upset stomach   Dyazide [Hydrochlorothiazide W-Triamterene] Hives   Jardiance [Empagliflozin] Diarrhea    (Jardiance) stomach ache    Lisinopril     Cough    Metformin Hcl Er Other (See Comments)     upset stomach    Topamax [Topiramate] Other (See Comments)    Vision disturbances.   Citalopram Itching and  Rash   Emtricitabine-Tenofovir Df Rash     Descovy   Losartan Nausea Only and Rash    Pt had rash, worsening dizziness, and nausea after starting losartan, which improved after stopping losartan   Triamterene-Hctz Rash   Medications Reviewed Today     Reviewed by Gayla Medicus, RN (Registered Nurse) on 09/10/21 at 1451  Med List Status: <None>   Medication Order Taking? Sig Documenting Provider Last Dose Status Informant  albuterol (VENTOLIN HFA) 108 (90 Base) MCG/ACT inhaler [id number]  Inhale 2 puffs into the lungs every 4 (four) hours as needed for wheezing or shortness of breath. Kozlow, Donnamarie Poag, MD  Active   amLODipine (NORVASC) 10 MG tablet [id number] No Take 1 tablet (10 mg total) by mouth daily. [name] Dame, MD Taking Active Self  atorvastatin (LIPITOR) 10 MG tablet [id number] No TAKE HALF TABLET BY MOUTH DAILY Katsadouros, Vasilios, MD Taking Active Self  bictegravir-emtricitabine-tenofovir AF (BIKTARVY) 50-200-25 MG TABS tablet [id number] No Take 1 tablet by mouth daily. [place] Callas, NP Taking Active Self  buPROPion (WELLBUTRIN XL) 300 MG 24 hr tablet [id number] No TAKE ONE TABLET BY MOUTH DAILY  Patient taking differently: Take 300 mg by mouth every evening.   [name] Dame, MD Taking Active Self  cetirizine (ZYRTEC) 10 MG tablet [id number]  Take 2 tablets (20 mg total) by mouth 2 (two) times daily as needed for allergies. Kozlow, Donnamarie Poag, MD  Active   chlorhexidine (PERIDEX) 0.12 % solution [id number] No Use as directed 15 mLs in the mouth or throat 2 (two) times daily. [place] Callas, NP Taking Active Self  cholecalciferol (VITAMIN D) 25 MCG (1000  UNIT) tablet [id number] No TAKE ONE TABLET BY MOUTH DAILY [name] Dame, MD Taking Active Self  cyclobenzaprine (FLEXERIL) 10 MG tablet [id number] No Take 1 tablet (10 mg total) by mouth 3 (three) times daily as needed for muscle spasms. Magnant, Gerrianne Scale, PA-C Taking Active Self  diphenoxylate-atropine (LOMOTIL)  2.5-0.025 MG tablet [id number] No Take 1 tablet by mouth 4 (four) times daily as needed for diarrhea or loose stools. [place] Callas, NP Taking Active Self  famotidine (PEPCID) 40 MG tablet [id number]  Take 1 tablet (40 mg total) by mouth at bedtime. Kozlow, Donnamarie Poag, MD  Active   hydrocortisone (CORTEF) 5 MG tablet [id number] No Take 2.5-5 mg by mouth See admin instructions. Take 1 tablet (5 mg) by mouth in the morning (scheduled) & may taken an additional 0.5 tablet (2.5 mg) by mouth in the evening if needed. [provider] Taking Active Self  ibuprofen (ADVIL) 800 MG tablet [id number]  1 po bid prn pain Meredith Pel, MD  Active   liraglutide (VICTOZA) 18 MG/3ML SOPN [id number] No Inject 1.2 mg into the skin every evening. [provider] Taking Active Self  methocarbamol (ROBAXIN) 500 MG tablet [id number]  Take 1 tablet (500 mg total) by mouth every 8 (eight) hours as needed for muscle spasms. Meredith Pel, MD  Active   metoprolol tartrate (LOPRESSOR) 25 MG tablet [id number] No Take 1 tablet (25 mg total) by mouth 2 (two) times daily. [name] Dame, MD Taking Active Self  montelukast (SINGULAIR) 10 MG tablet [id number]  Take 1 tablet (10 mg total) by mouth at bedtime. Jiles Prows, MD  Active   NEURONTIN 100 MG capsule [id number]  Take 2 capsules (200 mg total) by mouth 3 (three) times daily. Kozlow, Donnamarie Poag, MD  Active   nitroGLYCERIN (NITROSTAT) 0.4 MG SL tablet [id number] No Place 1 tablet (0.4 mg total) under the tongue every 5 (five) minutes as needed for chest pain. Isaiah Serge, NP Taking Active Self  nortriptyline (PAMELOR) 10 MG capsule [id number] No Take 2 capsules (20 mg total) by mouth at bedtime. Mar[place] Pacas, MD Taking Active Self  pantoprazole (PROTONIX) 40 MG tablet [id number]  Take 1 tablet (40 mg total) by mouth 2 (two) times daily. Kozlow, Donnamarie Poag, MD  Active   potassium chloride SA (KLOR-CON) 20 MEQ tablet [id number] No Take 20 mEq by mouth in the  morning. [provider] Taking Active Self  RESTASIS 0.05 % ophthalmic emulsion [id number] No Place 1 drop into both eyes 2 (two) times daily. [provider] Taking Active Self  sodium chloride (OCEAN) 0.65 % SOLN nasal spray [id number] No PLACE 1 SPRAY INTO BOTH NOSTRILS AS NEEDED FOR CONGESTION. [name], DO Taking Active Self  SUMAtriptan (IMITREX) 25 MG tablet [id number] No Take 1 tablet (25 mg total) by mouth every 2 (two) hours as needed for migraine. May repeat in 2 hours if headache persists or recurs. Marcial Pacas, MD Taking Active Self  SYMBICORT 160-4.5 MCG/ACT inhaler [id number]  Inhale 2 puffs into the lungs in the morning and at bedtime. Kozlow, Donnamarie Poag, MD  Active   traMADol Veatrice [place]) 50 MG tablet [id number] No Take 2 tablets (100 mg total) by mouth every 6 (six) hours as needed. Magnant, Gerrianne Scale, PA-C Taking Active   triamcinolone (NASACORT) 55 MCG/ACT AERO nasal inhaler [id number]  Place 1 spray into the nose 2 (two) times daily. 1 spray each nostril 2 times per day Jiles Prows, MD  Active  Patient Active Problem List   Diagnosis Date Noted   Acute otitis media with perforated tympanic membrane, right 08/03/2021   Impingement syndrome of left shoulder 07/01/2021   Left leg swelling 07/09/2020   Grief reaction 06/08/2020   Arm pain 02/10/2020   Dizziness on standing 12/27/2019   Generalized anxiety disorder with panic attacks 06/20/2019   Osteoarthritis of thumb, right 11/29/2018   Chronic migraine w/o aura w/o status migrainosus, not intractable 08/27/2018   Disorder of left eustachian tube 05/19/2017   Vertigo of central origin 01/23/2017   Central perforation of tympanic membrane of left ear 12/09/2016   Eustachian tube dysfunction, bilateral 06/15/2016   Ear pain, right 06/15/2016   Spondylosis of lumbar region without myelopathy or radiculopathy 10/08/2015   BPPV (benign paroxysmal positional vertigo) 10/02/2015   Liver  fibrosis ([place]) 12/04/2014   Pituitary microadenoma ([place]) 08/08/2014   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   Vitamin D deficiency 06/29/2014   Secondary adrenal insufficiency ([place]) 06/29/2014   History of tympanostomy tube placement 02/07/2014   Refusal of blood transfusions as patient is Jehovah's Witness 11/18/2013   Hepatitis C virus infection cured after antiviral drug therapy 03/16/2012   Health care maintenance 12/15/2011   Opioid dependence ([place]) 10/05/2010   HTN (hypertension) 10/05/2010   Hyperlipidemia associated with type 2 diabetes mellitus ([place]) 10/23/2008   HIV disease ([place]) 05/09/2006   Depression 05/09/2006   Allergic rhinitis 05/09/2006   GERD 05/09/2006   Conditions to be addressed/monitored per PCP order:  Chronic healthcare management needs, HTN, HIV, DM, asthma, GERD, anxiety/depression, HTN, osteoarthritis, hearing loss  Care Plan : General Plan of Care (Adult)  Updates made by Gayla Medicus, RN since 09/10/2021 12:00 AM     Problem: Health Promotion or Disease Self-Management (General Plan of Care)   Priority: Medium  Onset Date: 08/10/2020     Long-Range Goal: Self-Management Plan Developed   Start Date: 05/07/2020  Expected End Date: 12/11/2021  Recent Progress: On track  Priority: Medium  Note:   Current Barriers:  Chronic Disease Management support and education needs.  09/10/21:  Patient recovering from left shoulder surgery on 08/31/21-to start PT soon.  Also, to have left ear surgery when she can.  Patient in need of transportation-given [facility] transportation phone number with instruction.  BP and CBG WNL  Nurse Case Manager Clinical Goal(s):  Over the next 30 days, patient will attend all scheduled medical appointments  Interventions:  Inter-disciplinary care team collaboration (see longitudinal plan of care) Evaluation of current treatment plan and patient's adherence to plan as established by provider. Reviewed medications with  patient. Collaborated with pharmacy regarding medications. Discussed plans with patient for ongoing care management follow up and provided patient with direct contact information for care management team Reviewed scheduled/upcoming provider appointments. Pharmacy referral for medication review-completed. Patient provided with [facility] transportation information.  Patient Goals/Self-Care Activities Over the next 30 days, patient will:  -Attends all scheduled provider appointments Calls pharmacy for medication refills Calls provider office for new concerns or questions  Follow Up Plan: The Managed Medicaid care management team will reach out to the patient again over the next 30  business days.  The patient has been provided with contact information for the Managed Medicaid care management team and has been advised to call with any health related questions or concerns.    Follow Up:  Patient agrees to Care Plan and Follow-up.  Plan: The Managed Medicaid care management team will reach out to the patient again over  the next 30 days. and The  Patient has been provided with contact information for the Managed Medicaid care management team and has been advised to call with any health related questions or concerns.  Date/time of next scheduled RN care management/care coordination outreach:  10/13/21 at 0900

## 2021-09-17 DIAGNOSIS — M75122 Complete rotator cuff tear or rupture of left shoulder, not specified as traumatic: Secondary | ICD-10-CM

## 2021-09-17 DIAGNOSIS — M659 Synovitis and tenosynovitis, unspecified: Secondary | ICD-10-CM

## 2021-09-17 DIAGNOSIS — M65812 Other synovitis and tenosynovitis, left shoulder: Secondary | ICD-10-CM

## 2021-09-20 NOTE — Therapy (Signed)
OUTPATIENT PHYSICAL THERAPY SHOULDER EVALUATION   Patient Name: [name] [name] MRN: [id number] DOB:01-18-1961, 61 y.o., female Today's Date: 09/21/2021   PT End of Session - 09/21/21 1313     Visit Number 1    Number of Visits 15    Date for PT Re-Evaluation 11/18/21    Progress Note Due on Visit 10    PT Start Time 6063    PT Stop Time 1230    PT Time Calculation (min) 45 min    Activity Tolerance Patient limited by pain    Behavior During Therapy [facility] for tasks assessed/performed             Past Medical History:  Diagnosis Date   Allergic rhinitis 05/09/2006   Allergy    Anxiety    Arthritis    Asthma    CHF (congestive heart failure) (HCC)    Chronic back pain    Diabetes mellitus without complication (HCC)    GERD (gastroesophageal reflux disease)    Heart murmur    as a child   Hepatitis C    genotype 1b, 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 s/p 12 week course of simeprevir and sofosbuvir between October 2014 and January 2015 with resolution.   History of shingles    HIV infection ([place])    1994   Hyperlipidemia    no meds taken now   Hypertension    Migraine    Pituitary microadenoma ([place]) 08/08/2014   Pneumonia    Prediabetes    Refusal of blood transfusions as patient is Jehovah's Witness    Secondary adrenal insufficiency ([place]) 06/29/2014   Urticaria    Past Surgical History:  Procedure Laterality Date   BUNIONECTOMY     b/l   COLECTOMY     2003 for diverticulitis, had colostomy bag and then reversed   COLONOSCOPY     HAND SURGERY     HAND SURGERY Right    right thumb surgery Dr Burney Gauze 2021   NASAL SINUS SURGERY     SHOULDER ARTHROSCOPY WITH OPEN ROTATOR CUFF REPAIR AND DISTAL CLAVICLE ACROMINECTOMY Left 08/31/2021   Procedure: LEFT SHOULDER ARTHROSCOPY, DEBRIDEMENT,  ROTATOR CUFF TEAR REPAIR;  Surgeon: Meredith Pel, MD;  Location: [facility];  Service: Orthopedics;  Laterality: Left;   SHOULDER SURGERY     left   TONSILLECTOMY      Patient Active Problem List   Diagnosis Date Noted   Synovitis of left shoulder    Complete tear of left rotator cuff    Acute otitis media with perforated tympanic membrane, right 08/03/2021   Impingement syndrome of left shoulder 07/01/2021   Left leg swelling 07/09/2020   Grief reaction 06/08/2020   Arm pain 02/10/2020   Dizziness on standing 12/27/2019   Generalized anxiety disorder with panic attacks 06/20/2019   Osteoarthritis of thumb, right 11/29/2018   Chronic migraine w/o aura w/o status migrainosus, not intractable 08/27/2018   Disorder of left eustachian tube 05/19/2017   Vertigo of central origin 01/23/2017   Central perforation of tympanic membrane of left ear 12/09/2016   Eustachian tube dysfunction, bilateral 06/15/2016   Ear pain, right 06/15/2016   Spondylosis of lumbar region without myelopathy or radiculopathy 10/08/2015   BPPV (benign paroxysmal positional vertigo) 10/02/2015   Liver fibrosis ([place]) 12/04/2014   Pituitary microadenoma ([place]) 08/08/2014   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   Vitamin D deficiency 06/29/2014   Secondary adrenal insufficiency ([place]) 06/29/2014   History of tympanostomy tube placement  02/07/2014   Refusal of blood transfusions as patient is Jehovah's Witness 11/18/2013   Hepatitis C virus infection cured after antiviral drug therapy 03/16/2012   Health care maintenance 12/15/2011   Opioid dependence ([place]) 10/05/2010   HTN (hypertension) 10/05/2010   Hyperlipidemia associated with type 2 diabetes mellitus ([place]) 10/23/2008   HIV disease ([place]) 05/09/2006   Depression 05/09/2006   Allergic rhinitis 05/09/2006   GERD 05/09/2006    PCP: [name], MD  REFERRING PROVIDER: Meredith Pel, MD Left shoulder arthroscopy with debridement and rotator cuff tear repair. REFERRING DIAG: M79.602 (ICD-10-CM) - Left arm pain.   THERAPY DIAG:  Chronic left shoulder pain  Muscle weakness  (generalized)  Stiffness of left shoulder, not elsewhere classified  Rationale for Evaluation and Treatment Rehabilitation  ONSET DATE: 08/31/21  SUBJECTIVE:                                                                                                                                                                                      SUBJECTIVE STATEMENT: L shoulder is feeling better since surgery, but still can have significant pain. Pt reports having arthroscopic surgery for the L shoulder 15 years ago and thinks if was for bone spurs. Pt is R handed. Pt notes she is using a ice man to control pain and using a CPM for ROM.  PERTINENT HISTORY: left shoulder arthroscopy debridement rotator cuff tear repair 08/31/21, arthritis, anxiety, CHF, DM  PAIN:  Are you having pain? Yes: NPRS scale: 4-10/10 Pain location: L shoulder  Pain description: ache , throb, sharp Aggravating factors: L shoulder movement Relieving factors: Ice man, rest, medications  PRECAUTIONS: Other: per Dr. Randel Pigg visit note, pt may DC sling with start of PT. Pt is not to completing ligting of the L arm  WEIGHT BEARING RESTRICTIONS No  FALLS:  Has patient fallen in last 6 months? No  LIVING ENVIRONMENT: Lives with: lives with their family and lives alone Lives in: House/apartment Pt is able to access and be mobile with in her home  OCCUPATION: Disabled  PLOF: Independent  PATIENT GOALS Use it with less pain, better use with cooking and cleaning  OBJECTIVE:   DIAGNOSTIC FINDINGS:  L shoulder MRI 05/25/21 IMPRESSION: 1. Chronic calcific tendinosis of the posterior supraspinatus with moderate intermediate T2 signal and thickening tendinosis of the mid to posterior supraspinatus and anterior infraspinatus. 2. Additional likely chronic calcific tendinosis of the superior subscapularis tendon along with a moderate partial-thickness tear. 3. Status post distal clavicle excision and acromioplasty. 4. Mild  subacromial/subdeltoid bursitis. 5. Mild-to-moderate glenohumeral cartilage degenerative changes  PATIENT SURVEYS:  Quick Dash 53/55  COGNITION:  Overall cognitive status: Within  functional limits for tasks assessed     SENSATION: WFL  POSTURE: Forward head c CT step off and dorsal hump, increased kyphosis, rounded shoulders,   UPPER EXTREMITY ROM:   Passive ROM Right 09/21/2021 Left 09/21/2021  Shoulder flexion 160 105 d, limited by pain  Shoulder extension  115, limited by pain  Shoulder abduction 165   Shoulder adduction    Shoulder internal rotation T7   Shoulder external rotation T6  45d Limited by pain  Elbow flexion    Elbow extension    Wrist flexion    Wrist extension    Wrist ulnar deviation    Wrist radial deviation    Wrist pronation    Wrist supination    Empty end feel for PROM for L shoulder flexion, abd, and ER (Blank rows = not tested)  UPPER EXTREMITY MMT:   L not tested due to protocol  PALPATION:  TTP of the upper trap and supraspinatus region  OBSERVATION: Steri-strips covering the incision   TODAY'S TREATMENT:  - Flexion-Extension Shoulder Pendulum with Table Support 10 reps - Horizontal Shoulder Pendulum with Table Support 10 reps - Seated Shoulder Flexion Slide at Table Top with Forearm in Neutral 5 reps - 5 hold - Seated Shoulder Scaption Slide at Table Top with Forearm in Neutral 5 reps - 5 hold - Seated Shoulder External Rotation PROM on Table 5 reps - 5 hold   PATIENT EDUCATION: Education details: Eval findings, POC, HEP Person educated: Patient Education method: Explanation, Demonstration, Tactile cues, Verbal cues, and Handouts Education comprehension: verbalized understanding, returned demonstration, verbal cues required, and tactile cues required   HOME EXERCISE PROGRAM: Access Code: [email or web address] URL: [email or web address] Date: 09/21/2021 Prepared by: Gar Ponto  Exercises - Flexion-Extension Shoulder  Pendulum with Table Support  - 2 x daily - 7 x weekly - 2 sets - 10 reps - Horizontal Shoulder Pendulum with Table Support  - 2 x daily - 7 x weekly - 2 sets - 10 reps - Seated Shoulder Flexion Slide at Table Top with Forearm in Neutral  - 2 x daily - 7 x weekly - 1 sets - 5 reps - 5 hold - Seated Shoulder Scaption Slide at Table Top with Forearm in Neutral  - 2 x daily - 7 x weekly - 1 sets - 5 reps - 5 hold - Seated Shoulder External Rotation PROM on Table  - 2 x daily - 7 x weekly - 1 sets - 5 reps - 5 hold  ASSESSMENT:  CLINICAL IMPRESSION: Patient is a 61 y.o. F who was seen today for physical therapy evaluation and treatment for L shoulder pain and decreased function following a Left shoulder arthroscopy with debridement and rotator cuff tear repair on 08/31/21.  OBJECTIVE IMPAIRMENTS decreased ROM, decreased strength, impaired UE functional use, postural dysfunction, and pain.   ACTIVITY LIMITATIONS carrying, lifting, sleeping, dressing, and reach over head  PARTICIPATION LIMITATIONS: meal prep, cleaning, laundry, driving, shopping, community activity, and yard work  PERSONAL FACTORS Past/current experiences, Time since onset of injury/illness/exacerbation, and 1 comorbidity: DM  are also affecting patient's functional outcome.   REHAB POTENTIAL: Good  CLINICAL DECISION MAKING: Stable/uncomplicated  EVALUATION COMPLEXITY: Low   GOALS:  SHORT TERM GOALS: Target date: 10/12/2021  (Remove Blue Hyperlink)  Pt will be Ind in an initial HEP Baseline:started on eval Goal status: INITIAL  2.  Increase L shoulder flexion PROM to 125d and abd to 135d to progress to proper R shoulder function Baseline: flex= 105d and abd= 115d  Goal status: INITIAL  LONG TERM GOALS: Target date: 11/19/21  Increase L shoulder strength to 4 or > for improved R shoulder function Baseline: NT Goal status: INITIAL  2.  Increase L shoulder AROM to at least 85% of the R shoulder to complete above shoulder  reaching  Baseline: See flow sheet Goal status: INITIAL  3. Decrease L shoulder pain to 3/10 or less for improved L UE function and QOL  Baseline: 4-10/10 Goal status: INITIAL  4.  Pt will complete L shoulder above shoulder reaching c min to zero scapular shrug Baseline:  Goal status: INITIAL  5.  Pt will score 27 or less with the UE Quickdash as indication of improved function Baseline: 53 Goal status: INITIAL  6.  Pt will be Ind in a final HEP to maintain achieved LOF and QOL Baselinestarted on eval  Goal status: INITIAL   PLAN: PT FREQUENCY: 1w2, 2w6  PT DURATION: 8 weeks  PLANNED INTERVENTIONS: Therapeutic exercises, Therapeutic activity, Neuromuscular re-education, Balance training, Gait training, Patient/Family education, Joint mobilization, Aquatic Therapy, Dry Needling, Electrical stimulation, Cryotherapy, Moist heat, Taping, Vasopneumatic device, Ultrasound, Ionotophoresis '4mg'$ /ml Dexamethasone, Manual therapy, and Re-evaluation  PLAN FOR NEXT SESSION: Assess response to HEP. Progress PROM and AAROM as tolerated.  Edlin Ford MS, PT 09/21/21 5:26 PM  Check all possible CPT codes: 56861 - Re-evaluation, 97110- Therapeutic Exercise, [phone]- Neuro Re-education, [phone] - Therapeutic Activities, 97535 - Self Care, [id number] - Electrical stimulation (unattended), [id number] - Electrical stimulation (Manual), [id number] - Iontophoresis, [id number] - Ultrasound, [id number] - Vaso, and [id number] - Aquatic therapy     If treatment provided at initial evaluation, no treatment charged due to lack of authorization.

## 2021-09-21 ENCOUNTER — Ambulatory Visit: Payer: Medicaid Other | Attending: Orthopedic Surgery

## 2021-09-21 DIAGNOSIS — M25612 Stiffness of left shoulder, not elsewhere classified: Secondary | ICD-10-CM | POA: Insufficient documentation

## 2021-09-21 DIAGNOSIS — M79602 Pain in left arm: Secondary | ICD-10-CM | POA: Insufficient documentation

## 2021-09-21 DIAGNOSIS — G8929 Other chronic pain: Secondary | ICD-10-CM | POA: Insufficient documentation

## 2021-09-21 DIAGNOSIS — M25512 Pain in left shoulder: Secondary | ICD-10-CM | POA: Insufficient documentation

## 2021-09-21 DIAGNOSIS — M6281 Muscle weakness (generalized): Secondary | ICD-10-CM | POA: Diagnosis present

## 2021-09-28 ENCOUNTER — Encounter: Payer: Medicaid Other | Admitting: Physical Medicine & Rehabilitation

## 2021-09-30 ENCOUNTER — Ambulatory Visit (INDEPENDENT_AMBULATORY_CARE_PROVIDER_SITE_OTHER): Payer: Medicaid Other | Admitting: Surgical

## 2021-09-30 DIAGNOSIS — Z9889 Other specified postprocedural states: Secondary | ICD-10-CM

## 2021-09-30 NOTE — Therapy (Signed)
OUTPATIENT PHYSICAL THERAPY TREATMENT NOTE   Patient Name: [name] [name] MRN: [id number] DOB:12-Nov-1960, 61 y.o., female Today's Date: 10/01/2021  PCP: [name], MD  REFERRING PROVIDER: Meredith Pel, MD  END OF SESSION:   PT End of Session - 10/01/21 1319     Visit Number 2    Number of Visits 15    Date for PT Re-Evaluation 11/18/21    Progress Note Due on Visit 10    PT Start Time 1230    PT Stop Time 1325    PT Time Calculation (min) 55 min    Activity Tolerance Patient limited by pain;Patient tolerated treatment well    Behavior During Therapy [facility] for tasks assessed/performed             Past Medical History:  Diagnosis Date   Allergic rhinitis 05/09/2006   Allergy    Anxiety    Arthritis    Asthma    CHF (congestive heart failure) (HCC)    Chronic back pain    Diabetes mellitus without complication (HCC)    GERD (gastroesophageal reflux disease)    Heart murmur    as a child   Hepatitis C    genotype 1b, 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 s/p 12 week course of simeprevir and sofosbuvir between October 2014 and January 2015 with resolution.   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   Pneumonia    Prediabetes    Refusal of blood transfusions as patient is Jehovah's Witness    Secondary adrenal insufficiency ([place]) 06/29/2014   Urticaria    Past Surgical History:  Procedure Laterality Date   BUNIONECTOMY     b/l   COLECTOMY     2003 for diverticulitis, had colostomy bag and then reversed   COLONOSCOPY     HAND SURGERY     HAND SURGERY Right    right thumb surgery Dr Burney Gauze 2021   NASAL SINUS SURGERY     SHOULDER ARTHROSCOPY WITH OPEN ROTATOR CUFF REPAIR AND DISTAL CLAVICLE ACROMINECTOMY Left 08/31/2021   Procedure: LEFT SHOULDER ARTHROSCOPY, DEBRIDEMENT,  ROTATOR CUFF TEAR REPAIR;  Surgeon: Meredith Pel, MD;  Location: [facility];  Service: Orthopedics;  Laterality: Left;   SHOULDER SURGERY     left   TONSILLECTOMY     Patient Active Problem List   Diagnosis Date Noted   Synovitis of left shoulder    Complete tear of left rotator cuff    Acute otitis media with perforated tympanic membrane, right 08/03/2021   Impingement syndrome of left shoulder 07/01/2021   Left leg swelling 07/09/2020   Grief reaction 06/08/2020   Arm pain 02/10/2020   Dizziness on standing 12/27/2019   Generalized anxiety disorder with panic attacks 06/20/2019   Osteoarthritis of thumb, right 11/29/2018   Chronic migraine w/o aura w/o status migrainosus, not intractable 08/27/2018   Disorder of left eustachian tube 05/19/2017   Vertigo of central origin 01/23/2017   Central perforation of tympanic membrane of left ear 12/09/2016   Eustachian tube dysfunction, bilateral 06/15/2016   Ear pain, right 06/15/2016   Spondylosis of lumbar region without myelopathy or radiculopathy 10/08/2015   BPPV (benign paroxysmal positional vertigo) 10/02/2015   Liver fibrosis ([place]) 12/04/2014   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/08/2014   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  Vitamin D deficiency 06/29/2014   Secondary adrenal insufficiency ([place]) 06/29/2014   History of tympanostomy tube placement 02/07/2014   Refusal of blood transfusions as patient is Jehovah's Witness 11/18/2013   Hepatitis C virus infection cured after antiviral drug therapy 03/16/2012   Health care maintenance 12/15/2011   Opioid dependence ([place]) 10/05/2010   HTN (hypertension) 10/05/2010   Hyperlipidemia associated with type 2 diabetes mellitus ([place]) 10/23/2008   HIV disease ([place]) 05/09/2006   Depression 05/09/2006   Allergic rhinitis 05/09/2006   GERD 05/09/2006    REFERRING DIAG: M79.602 (ICD-10-CM) - Left arm pain.  Left shoulder arthroscopy with debridement and rotator cuff tear repair.  THERAPY DIAG:  Chronic left  shoulder pain  Muscle weakness (generalized)  Stiffness of left shoulder, not elsewhere classified  Rationale for Evaluation and Treatment Rehabilitation  Onset: 08/31/21   SUBJECTIVE:                                                                                                                                                                                       SUBJECTIVE STATEMENT: Pt reports she is completing her exs. She notes seeingr. Dean's PA yesterday Pain location: L shoulder  Pain description: ache , throb, sharp Aggravating factors: L shoulder movement Relieving factors: Ice man, rest, medications Pain range 4-10/10   PRECAUTIONS: Other: per Dr. Randel Pigg visit note, pt may DC sling with start of PT. Pt is not to completing ligting of the L arm    PERTINENT HISTORY: left shoulder arthroscopy debridement rotator cuff tear repair 08/31/21, arthritis, anxiety, CHF, DM   WEIGHT BEARING RESTRICTIONS No   FALLS:  Has patient fallen in last 6 months? No   LIVING ENVIRONMENT: Lives with: lives with their family and lives alone Lives in: House/apartment Pt is able to access and be mobile with in her home   OCCUPATION: Disabled   PLOF: Independent   PATIENT GOALS Use it with less pain, better use with cooking and cleaning   OBJECTIVE: (objective measures completed at initial evaluation unless otherwise dated)   DIAGNOSTIC FINDINGS:  L shoulder MRI 05/25/21 IMPRESSION: 1. Chronic calcific tendinosis of the posterior supraspinatus with moderate intermediate T2 signal and thickening tendinosis of the mid to posterior supraspinatus and anterior infraspinatus. 2. Additional likely chronic calcific tendinosis of the superior subscapularis tendon along with a moderate partial-thickness tear. 3. Status post distal clavicle excision and acromioplasty. 4. Mild subacromial/subdeltoid bursitis. 5. Mild-to-moderate glenohumeral cartilage degenerative changes   PATIENT SURVEYS:   Quick Dash 53/55   COGNITION:           Overall cognitive status: Within functional limits for tasks assessed  SENSATION: WFL   POSTURE: Forward head c CT step off and dorsal hump, increased kyphosis, rounded shoulders,    UPPER EXTREMITY ROM:    Passive ROM Right 09/21/2021 Left 09/21/2021 LT 10/01/21  Shoulder flexion 160 105 d, limited by pain 125, limited by pain, empty endfell  Shoulder extension   115, limited by pain   Shoulder abduction 165   115, limited by pain, empty end feel  Shoulder adduction       Shoulder internal rotation T7     Shoulder external rotation T6  45d Limited by pain 45d, not to exceed yet  Elbow flexion       Elbow extension       Wrist flexion       Wrist extension       Wrist ulnar deviation       Wrist radial deviation       Wrist pronation       Wrist supination       Empty end feel for PROM for L shoulder flexion, abd, and ER (Blank rows = not tested)   UPPER EXTREMITY MMT:                       L not tested due to protocol   PALPATION:  TTP of the upper trap and supraspinatus region   OBSERVATION: Steri-strips covering the incision             TODAY'S TREATMENT:  [facility] Adult PT Treatment:                                                DATE: 10/01/21 Therapeutic Exercise: Shoulder pulleys flexion and scaption 1 min  Putty work- squeeze, opening, pinching, finger abd  Table top AAROM for flexion, abd, and ER x10  Manual Therapy: PROM for flex/ext, abd/add, ER/IR  Eavl Treatment: - Flexion-Extension Shoulder Pendulum with Table Support 10 reps - Horizontal Shoulder Pendulum with Table Support 10 reps - Seated Shoulder Flexion Slide at Table Top with Forearm in Neutral 5 reps - 5 hold - Seated Shoulder Scaption Slide at Table Top with Forearm in Neutral 5 reps - 5 hold - Seated Shoulder External Rotation PROM on Table 5 reps - 5 hold     PATIENT EDUCATION: Education details: Eval findings, POC,  HEP Person educated: Patient Education method: Explanation, Demonstration, Tactile cues, Verbal cues, and Handouts Education comprehension: verbalized understanding, returned demonstration, verbal cues required, and tactile cues required     HOME EXERCISE PROGRAM: Access Code: [email or web address] URL: [email or web address] Date: 09/21/2021 Prepared by: Gar Ponto   Exercises - Flexion-Extension Shoulder Pendulum with Table Support  - 2 x daily - 7 x weekly - 2 sets - 10 reps - Horizontal Shoulder Pendulum with Table Support  - 2 x daily - 7 x weekly - 2 sets - 10 reps - Seated Shoulder Flexion Slide at Table Top with Forearm in Neutral  - 2 x daily - 7 x weekly - 1 sets - 5 reps - 5 hold - Seated Shoulder Scaption Slide at Table Top with Forearm in Neutral  - 2 x daily - 7 x weekly - 1 sets - 5 reps - 5 hold - Seated Shoulder External Rotation PROM on Table  - 2 x daily - 7 x weekly - 1 sets - 5 reps - 5  hold   ASSESSMENT:   CLINICAL IMPRESSION: Pt reports consistent completion of HEP. Flexion PROM has improved, abduction is the same as the eval, and ER should not exceed 45d. The end feel for all motions are limited by pain with a empty end feel indicating more ROM available for each motion. Pt completed the table top AAROM exs correctly. Pt's L shoulder PROM is appropriate for time frame past surgery. Pt will continue to benefit with skilled to address deficits to optimize L shoulder/UE function.   OBJECTIVE IMPAIRMENTS decreased ROM, decreased strength, impaired UE functional use, postural dysfunction, and pain.    ACTIVITY LIMITATIONS carrying, lifting, sleeping, dressing, and reach over head   PARTICIPATION LIMITATIONS: meal prep, cleaning, laundry, driving, shopping, community activity, and yard work   PERSONAL FACTORS Past/current experiences, Time since onset of injury/illness/exacerbation, and 1 comorbidity: DM  are also affecting patient's functional outcome.    REHAB  POTENTIAL: Good   CLINICAL DECISION MAKING: Stable/uncomplicated   EVALUATION COMPLEXITY: Low     GOALS:   SHORT TERM GOALS: Target date: 10/12/2021  (Remove Blue Hyperlink)   Pt will be Ind in an initial HEP Baseline:started on eval Goal status: INITIAL   2.  Increase L shoulder flexion PROM to 125d and abd to 135d to progress to proper R shoulder function Baseline: flex= 105d and abd= 115d Goal status: INITIAL   LONG TERM GOALS: Target date: 11/19/21   Increase L shoulder strength to 4 or > for improved R shoulder function Baseline: NT Goal status: INITIAL   2.  Increase L shoulder AROM to at least 85% of the R shoulder to complete above shoulder reaching  Baseline: See flow sheet Goal status: INITIAL   3. Decrease L shoulder pain to 3/10 or less for improved L UE function and QOL  Baseline: 4-10/10 Goal status: INITIAL   4.  Pt will complete L shoulder above shoulder reaching c min to zero scapular shrug Baseline:  Goal status: INITIAL   5.  Pt will score 27 or less with the UE Quickdash as indication of improved function Baseline: 53 Goal status: INITIAL   6.  Pt will be Ind in a final HEP to maintain achieved LOF and QOL Baselinestarted on eval  Goal status: INITIAL     PLAN: PT FREQUENCY: 1w2, 2w6   PT DURATION: 8 weeks   PLANNED INTERVENTIONS: Therapeutic exercises, Therapeutic activity, Neuromuscular re-education, Balance training, Gait training, Patient/Family education, Joint mobilization, Aquatic Therapy, Dry Needling, Electrical stimulation, Cryotherapy, Moist heat, Taping, Vasopneumatic device, Ultrasound, Ionotophoresis '4mg'$ /ml Dexamethasone, Manual therapy, and Re-evaluation   PLAN FOR NEXT SESSION: Assess response to HEP. Progress PROM and AAROM as tolerated  Himani Corona MS, PT 10/01/21 1:24 PM

## 2021-10-01 ENCOUNTER — Telehealth: Payer: Self-pay | Admitting: Orthopedic Surgery

## 2021-10-01 ENCOUNTER — Ambulatory Visit: Payer: Medicaid Other | Attending: Orthopedic Surgery

## 2021-10-01 ENCOUNTER — Other Ambulatory Visit: Payer: Self-pay | Admitting: Surgical

## 2021-10-01 DIAGNOSIS — M25512 Pain in left shoulder: Secondary | ICD-10-CM | POA: Diagnosis present

## 2021-10-01 DIAGNOSIS — M6281 Muscle weakness (generalized): Secondary | ICD-10-CM | POA: Diagnosis present

## 2021-10-01 DIAGNOSIS — M25612 Stiffness of left shoulder, not elsewhere classified: Secondary | ICD-10-CM | POA: Diagnosis present

## 2021-10-01 DIAGNOSIS — G8929 Other chronic pain: Secondary | ICD-10-CM | POA: Insufficient documentation

## 2021-10-01 MED ORDER — DICLOFENAC SODIUM 75 MG PO TBEC: 75.0000 mg | DELAYED_RELEASE_TABLET | Freq: Two times a day (BID) | ORAL | 0 refills | Status: DC

## 2021-10-01 NOTE — Telephone Encounter (Signed)
Pt wondering if she can get some else for her pain that is not a narcotic. She states the ibuprofen is not working  Cb  [phone]

## 2021-10-01 NOTE — Telephone Encounter (Signed)
Sent in diclofenac, dont take with ibuprofen

## 2021-10-03 ENCOUNTER — Encounter: Payer: Self-pay | Admitting: Orthopedic Surgery

## 2021-10-03 NOTE — Progress Notes (Signed)
Post-Op Visit Note   Patient: [name] [name]           Date of Birth: 08-15-1960           MRN: [id number] Visit Date: 09/30/2021 PCP: [name], MD   Assessment & Plan:  Chief Complaint:  Chief Complaint  Patient presents with   Left Shoulder - Routine Post Op     08/31/21 (4w 2d) Left Shoulder Arthroscopy, Debridement,  Rotator Cuff Tear Repair      Visit Diagnoses:  1. Status post rotator cuff repair     Plan: Patient is a 61 year old female who presents s/p left shoulder arthroscopy with rotator cuff repair on 08/31/2021.  She complains of continued pain.  She is in the sling and going to physical therapy 2 times per week where they are mainly focusing on passive range of motion and pendulum exercises based on her description.  Most of her pain she experiences at night.  She is still using CPM machine 2 times per day and is set to 120 degrees.  Taking ibuprofen 800 mg and Robaxin and using ice as well.  She has been trying to lift the arm under its own power up until today when I cautioned her against full active range of motion of the left shoulder at this point.  She may discontinue the sling.  On exam, she has 30 degrees external rotation, 80 degrees abduction, 120 degrees forward flexion.  Incisions are healing well without evidence of infection or dehiscence.  She has 2+ radial pulse of the operative extremity with intact EPL, FPL, finger abduction, finger adduction, pronation/supination, bicep, tricep.  Axillary nerve is intact with deltoid firing.  Decent strength of supra, infra, subscap noted on exam today.  Plan to continue with physical therapy mainly focusing on passive and active assisted range of motion.  She may start full active range of motion of the left shoulder as well as rotator cuff strengthening exercises about 2 weeks from now when she is 6 weeks out from surgery.  Follow-up with the office in 4 weeks for clinical recheck.  Follow-Up Instructions: No  follow-ups on file.   Orders:  No orders of the defined types were placed in this encounter.  No orders of the defined types were placed in this encounter.   Imaging: No results found.  PMFS History: Patient Active Problem List   Diagnosis Date Noted   Synovitis of left shoulder    Complete tear of left rotator cuff    Acute otitis media with perforated tympanic membrane, right 08/03/2021   Impingement syndrome of left shoulder 07/01/2021   Left leg swelling 07/09/2020   Grief reaction 06/08/2020   Arm pain 02/10/2020   Dizziness on standing 12/27/2019   Generalized anxiety disorder with panic attacks 06/20/2019   Osteoarthritis of thumb, right 11/29/2018   Chronic migraine w/o aura w/o status migrainosus, not intractable 08/27/2018   Disorder of left eustachian tube 05/19/2017   Vertigo of central origin 01/23/2017   Central perforation of tympanic membrane of left ear 12/09/2016   Eustachian tube dysfunction, bilateral 06/15/2016   Ear pain, right 06/15/2016   Spondylosis of lumbar region without myelopathy or radiculopathy 10/08/2015   BPPV (benign paroxysmal positional vertigo) 10/02/2015   Liver fibrosis ([place]) 12/04/2014   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   Vitamin D deficiency 06/29/2014   Secondary adrenal insufficiency ([place]) 06/29/2014   History of tympanostomy tube placement 02/07/2014   Refusal  of blood transfusions as patient is Jehovah's Witness 11/18/2013   Hepatitis C virus infection cured after antiviral drug therapy 03/16/2012   Health care maintenance 12/15/2011   Opioid dependence ([place]) 10/05/2010   HTN (hypertension) 10/05/2010   Hyperlipidemia associated with type 2 diabetes mellitus ([place]) 10/23/2008   HIV disease ([place]) 05/09/2006   Depression 05/09/2006   Allergic rhinitis 05/09/2006   GERD 05/09/2006   Past Medical History:  Diagnosis Date   Allergic rhinitis 05/09/2006   Allergy     Anxiety    Arthritis    Asthma    CHF (congestive heart failure) (HCC)    Chronic back pain    Diabetes mellitus without complication (HCC)    GERD (gastroesophageal reflux disease)    Heart murmur    as a child   Hepatitis C    genotype 1b, 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 s/p 12 week course of simeprevir and sofosbuvir between October 2014 and January 2015 with resolution.   History of shingles    HIV infection ([place])    1994   Hyperlipidemia    no meds taken now   Hypertension    Migraine    Pituitary microadenoma ([place]) 08/08/2014   Pneumonia    Prediabetes    Refusal of blood transfusions as patient is Jehovah's Witness    Secondary adrenal insufficiency ([place]) 06/29/2014   Urticaria     Family History  Problem Relation Age of Onset   Heart disease Mother    Diabetes Mother    Stroke Mother    Heart disease Father    Stroke Father    Diabetes Father    Hepatitis Sister        hcv   Asthma Sister    Allergic rhinitis Sister    Stroke Other    Colon polyps Brother    Renal Disease Brother    Cancer Sister        lung   Asthma Sister    Allergic rhinitis Sister    Cancer Maternal Aunt    Cancer Maternal Aunt    Colon cancer Neg Hx    Esophageal cancer Neg Hx    Stomach cancer Neg Hx    Rectal cancer Neg Hx    Angioedema Neg Hx    Eczema Neg Hx    Urticaria Neg Hx     Past Surgical History:  Procedure Laterality Date   BUNIONECTOMY     b/l   COLECTOMY     2003 for diverticulitis, had colostomy bag and then reversed   COLONOSCOPY     HAND SURGERY     HAND SURGERY Right    right thumb surgery Dr Weingold 2021   [place] ARTHROSCOPY WITH OPEN ROTATOR CUFF REPAIR AND DISTAL CLAVICLE ACROMINECTOMY Left 08/31/2021   Procedure: LEFT SHOULDER ARTHROSCOPY, DEBRIDEMENT,  ROTATOR CUFF TEAR REPAIR;  Surgeon: Meredith Pel, MD;  Location: [facility];  Service: Orthopedics;  Laterality: Left;   SHOULDER SURGERY     left    TONSILLECTOMY     Social History   Occupational History   Occupation: Disabled  Tobacco Use   Smoking status: Former    Years: 20.00    Types: Cigarettes    Quit date: 04/26/2007    Years since quitting: 14.4   Smokeless tobacco: Never   Tobacco comments:    QUIT 2009  Vaping Use   Vaping Use: Never used  Substance and Sexual Activity  Alcohol use: No    Alcohol/week: 0.0 standard drinks of alcohol   Drug use: Not Currently    Types: Marijuana    Comment: cannabis in the past   Sexual activity: Not Currently    Partners: Male    Birth control/protection: Condom    Comment: declined condoms

## 2021-10-04 ENCOUNTER — Encounter: Payer: Self-pay | Admitting: Infectious Diseases

## 2021-10-04 NOTE — Telephone Encounter (Signed)
IC advised.  

## 2021-10-05 ENCOUNTER — Other Ambulatory Visit: Payer: Self-pay | Admitting: *Deleted

## 2021-10-05 ENCOUNTER — Telehealth: Payer: Self-pay

## 2021-10-05 MED ORDER — VITAMIN D3 25 MCG (1000 UNIT) PO TABS: 1000.0000 [IU] | ORAL_TABLET | Freq: Every day | ORAL | 2 refills | Status: DC

## 2021-10-05 NOTE — Telephone Encounter (Signed)
PA initiated on [email or web address]

## 2021-10-05 NOTE — Therapy (Signed)
OUTPATIENT PHYSICAL THERAPY TREATMENT NOTE   Patient Name: [name] [name] MRN: [id number] DOB:06-17-1960, 61 y.o., female Today's Date: 10/06/2021  PCP: [name], MD  REFERRING PROVIDER: Meredith Pel, MD  END OF SESSION:   PT End of Session - 10/06/21 1101     Visit Number 3    Number of Visits 15    Date for PT Re-Evaluation 11/18/21    Progress Note Due on Visit 10    PT Start Time 1100    PT Stop Time 1140    PT Time Calculation (min) 40 min    Activity Tolerance Patient limited by pain;Patient tolerated treatment well    Behavior During Therapy [facility] for tasks assessed/performed              Past Medical History:  Diagnosis Date   Allergic rhinitis 05/09/2006   Allergy    Anxiety    Arthritis    Asthma    CHF (congestive heart failure) (HCC)    Chronic back pain    Diabetes mellitus without complication (HCC)    GERD (gastroesophageal reflux disease)    Heart murmur    as a child   Hepatitis C    genotype 1b, 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 s/p 12 week course of simeprevir and sofosbuvir between October 2014 and January 2015 with resolution.   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   Pneumonia    Prediabetes    Refusal of blood transfusions as patient is Jehovah's Witness    Secondary adrenal insufficiency ([place]) 06/29/2014   Urticaria    Past Surgical History:  Procedure Laterality Date   BUNIONECTOMY     b/l   COLECTOMY     2003 for diverticulitis, had colostomy bag and then reversed   COLONOSCOPY     HAND SURGERY     HAND SURGERY Right    right thumb surgery Dr Burney Gauze 2021   NASAL SINUS SURGERY     SHOULDER ARTHROSCOPY WITH OPEN ROTATOR CUFF REPAIR AND DISTAL CLAVICLE ACROMINECTOMY Left 08/31/2021   Procedure: LEFT SHOULDER ARTHROSCOPY, DEBRIDEMENT,  ROTATOR CUFF TEAR REPAIR;  Surgeon: Meredith Pel, MD;  Location: [place];  Service: Orthopedics;  Laterality: Left;   SHOULDER SURGERY     left   TONSILLECTOMY     Patient Active Problem List   Diagnosis Date Noted   Synovitis of left shoulder    Complete tear of left rotator cuff    Acute otitis media with perforated tympanic membrane, right 08/03/2021   Impingement syndrome of left shoulder 07/01/2021   Left leg swelling 07/09/2020   Grief reaction 06/08/2020   Arm pain 02/10/2020   Dizziness on standing 12/27/2019   Generalized anxiety disorder with panic attacks 06/20/2019   Osteoarthritis of thumb, right 11/29/2018   Chronic migraine w/o aura w/o status migrainosus, not intractable 08/27/2018   Disorder of left eustachian tube 05/19/2017   Vertigo of central origin 01/23/2017   Central perforation of tympanic membrane of left ear 12/09/2016   Eustachian tube dysfunction, bilateral 06/15/2016   Ear pain, right 06/15/2016   Spondylosis of lumbar region without myelopathy or radiculopathy 10/08/2015   BPPV (benign paroxysmal positional vertigo) 10/02/2015   Liver fibrosis ([place]) 12/04/2014   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  Vitamin D deficiency 06/29/2014   Secondary adrenal insufficiency ([place]) 06/29/2014   History of tympanostomy tube placement 02/07/2014   Refusal of blood transfusions as patient is Jehovah's Witness 11/18/2013   Hepatitis C virus infection cured after antiviral drug therapy 03/16/2012   Health care maintenance 12/15/2011   Opioid dependence ([place]) 10/05/2010   HTN (hypertension) 10/05/2010   Hyperlipidemia associated with type 2 diabetes mellitus ([place]) 10/23/2008   HIV disease ([place]) 05/09/2006   Depression 05/09/2006   Allergic rhinitis 05/09/2006   GERD 05/09/2006    REFERRING DIAG: M79.602 (ICD-10-CM) - Left arm pain.  Left shoulder arthroscopy with debridement and rotator cuff tear repair.  THERAPY DIAG:  Chronic left  shoulder pain  Muscle weakness (generalized)  Stiffness of left shoulder, not elsewhere classified  Rationale for Evaluation and Treatment Rehabilitation  Onset: 08/31/21   SUBJECTIVE:                                                                                                                                                                                       SUBJECTIVE STATEMENT: Patient reports that the pain is worse when she's sleeping. She states she is not moving her arm actively. Pain location: L shoulder  Pain description: ache , throb, sharp Aggravating factors: L shoulder movement Relieving factors: Ice man, rest, medications Pain range 9/10   PRECAUTIONS: Other: per Dr. Randel Pigg visit note, pt may DC sling with start of PT. Pt is not to completing ligting of the L arm    PERTINENT HISTORY: left shoulder arthroscopy debridement rotator cuff tear repair 08/31/21, arthritis, anxiety, CHF, DM   WEIGHT BEARING RESTRICTIONS No   FALLS:  Has patient fallen in last 6 months? No   LIVING ENVIRONMENT: Lives with: lives with their family and lives alone Lives in: House/apartment Pt is able to access and be mobile with in her home   OCCUPATION: Disabled   PLOF: Independent   PATIENT GOALS Use it with less pain, better use with cooking and cleaning   OBJECTIVE: (objective measures completed at initial evaluation unless otherwise dated)   DIAGNOSTIC FINDINGS:  L shoulder MRI 05/25/21 IMPRESSION: 1. Chronic calcific tendinosis of the posterior supraspinatus with moderate intermediate T2 signal and thickening tendinosis of the mid to posterior supraspinatus and anterior infraspinatus. 2. Additional likely chronic calcific tendinosis of the superior subscapularis tendon along with a moderate partial-thickness tear. 3. Status post distal clavicle excision and acromioplasty. 4. Mild subacromial/subdeltoid bursitis. 5. Mild-to-moderate glenohumeral cartilage degenerative  changes   PATIENT SURVEYS:  Quick Dash 53/55   COGNITION:           Overall cognitive status: Within functional limits for tasks  assessed                                  SENSATION: WFL   POSTURE: Forward head c CT step off and dorsal hump, increased kyphosis, rounded shoulders,    UPPER EXTREMITY ROM:    Passive ROM Right 09/21/2021 Left 09/21/2021 LT 10/01/21  Shoulder flexion 160 105 d, limited by pain 125, limited by pain, empty endfell  Shoulder extension   115, limited by pain   Shoulder abduction 165   115, limited by pain, empty end feel  Shoulder adduction       Shoulder internal rotation T7     Shoulder external rotation T6  45d Limited by pain 45d, not to exceed yet  Elbow flexion       Elbow extension       Wrist flexion       Wrist extension       Wrist ulnar deviation       Wrist radial deviation       Wrist pronation       Wrist supination       Empty end feel for PROM for L shoulder flexion, abd, and ER (Blank rows = not tested)   UPPER EXTREMITY MMT:                       L not tested due to protocol   PALPATION:  TTP of the upper trap and supraspinatus region   OBSERVATION: Steri-strips covering the incision             TODAY'S TREATMENT:  [facility] Adult PT Treatment:                                                DATE: 10/06/2021 Therapeutic Exercise: Shoulder pulleys flexion and scaption x 2 min each Seated scapular retraction 2x10 Table top AAROM for flexion, abd, and ER x10  Manual Therapy: PROM for flex/ext, abd/add, ER/IR   [facility] Adult PT Treatment:                                                DATE: 10/01/21 Therapeutic Exercise: Shoulder pulleys flexion and scaption 1 min  Putty work- squeeze, opening, pinching, finger abd  Table top AAROM for flexion, abd, and ER x10  Manual Therapy: PROM for flex/ext, abd/add, ER/IR  Eavl Treatment: - Flexion-Extension Shoulder Pendulum with Table Support 10 reps - Horizontal Shoulder Pendulum with  Table Support 10 reps - Seated Shoulder Flexion Slide at Table Top with Forearm in Neutral 5 reps - 5 hold - Seated Shoulder Scaption Slide at Table Top with Forearm in Neutral 5 reps - 5 hold - Seated Shoulder External Rotation PROM on Table 5 reps - 5 hold     PATIENT EDUCATION: Education details: Eval findings, POC, HEP Person educated: Patient Education method: Explanation, Demonstration, Tactile cues, Verbal cues, and Handouts Education comprehension: verbalized understanding, returned demonstration, verbal cues required, and tactile cues required     HOME EXERCISE PROGRAM: Access Code: [email or web address] URL: [email or web address] Date: 09/21/2021 Prepared by: Gar Ponto   Exercises - Flexion-Extension Shoulder Pendulum with Table Support  -  2 x daily - 7 x weekly - 2 sets - 10 reps - Horizontal Shoulder Pendulum with Table Support  - 2 x daily - 7 x weekly - 2 sets - 10 reps - Seated Shoulder Flexion Slide at Table Top with Forearm in Neutral  - 2 x daily - 7 x weekly - 1 sets - 5 reps - 5 hold - Seated Shoulder Scaption Slide at Table Top with Forearm in Neutral  - 2 x daily - 7 x weekly - 1 sets - 5 reps - 5 hold - Seated Shoulder External Rotation PROM on Table  - 2 x daily - 7 x weekly - 1 sets - 5 reps - 5 hold   ASSESSMENT:   CLINICAL IMPRESSION: Patient presents to PT with continued high pain in her L shoulder and reports HEP compliance. She states she is not actively using her arm, but was noted to be holding her water bottle in the Lt and when moving a chair she utilized both UE. She was advised from message received from PA to not actively use her Lt arm at this time. Session today focused on AAROM and PROM in protocol ranges, she remains limited by pain in these ranges at this time. Patient continues to benefit from skilled PT services and should be progressed as able to improve functional independence.    OBJECTIVE IMPAIRMENTS decreased ROM, decreased strength,  impaired UE functional use, postural dysfunction, and pain.    ACTIVITY LIMITATIONS carrying, lifting, sleeping, dressing, and reach over head   PARTICIPATION LIMITATIONS: meal prep, cleaning, laundry, driving, shopping, community activity, and yard work   PERSONAL FACTORS Past/current experiences, Time since onset of injury/illness/exacerbation, and 1 comorbidity: DM  are also affecting patient's functional outcome.    REHAB POTENTIAL: Good   CLINICAL DECISION MAKING: Stable/uncomplicated   EVALUATION COMPLEXITY: Low     GOALS:   SHORT TERM GOALS: Target date: 10/12/2021  (Remove Blue Hyperlink)   Pt will be Ind in an initial HEP Baseline:started on eval Goal status: INITIAL   2.  Increase L shoulder flexion PROM to 125d and abd to 135d to progress to proper R shoulder function Baseline: flex= 105d and abd= 115d Goal status: INITIAL   LONG TERM GOALS: Target date: 11/19/21   Increase L shoulder strength to 4 or > for improved R shoulder function Baseline: NT Goal status: INITIAL   2.  Increase L shoulder AROM to at least 85% of the R shoulder to complete above shoulder reaching  Baseline: See flow sheet Goal status: INITIAL   3. Decrease L shoulder pain to 3/10 or less for improved L UE function and QOL  Baseline: 4-10/10 Goal status: INITIAL   4.  Pt will complete L shoulder above shoulder reaching c min to zero scapular shrug Baseline:  Goal status: INITIAL   5.  Pt will score 27 or less with the UE Quickdash as indication of improved function Baseline: 53 Goal status: INITIAL   6.  Pt will be Ind in a final HEP to maintain achieved LOF and QOL Baselinestarted on eval  Goal status: INITIAL     PLAN: PT FREQUENCY: 1w2, 2w6   PT DURATION: 8 weeks   PLANNED INTERVENTIONS: Therapeutic exercises, Therapeutic activity, Neuromuscular re-education, Balance training, Gait training, Patient/Family education, Joint mobilization, Aquatic Therapy, Dry Needling,  Electrical stimulation, Cryotherapy, Moist heat, Taping, Vasopneumatic device, Ultrasound, Ionotophoresis '4mg'$ /ml Dexamethasone, Manual therapy, and Re-evaluation   PLAN FOR NEXT SESSION: Assess response to HEP. Progress PROM and  AAROM as tolerated   Evelene Croon, PTA 10/06/21 11:44 AM

## 2021-10-06 ENCOUNTER — Ambulatory Visit: Payer: Medicaid Other

## 2021-10-06 DIAGNOSIS — M25612 Stiffness of left shoulder, not elsewhere classified: Secondary | ICD-10-CM

## 2021-10-06 DIAGNOSIS — M6281 Muscle weakness (generalized): Secondary | ICD-10-CM

## 2021-10-06 DIAGNOSIS — G8929 Other chronic pain: Secondary | ICD-10-CM

## 2021-10-06 DIAGNOSIS — M25512 Pain in left shoulder: Secondary | ICD-10-CM | POA: Diagnosis not present

## 2021-10-08 ENCOUNTER — Ambulatory Visit: Payer: Medicaid Other

## 2021-10-08 DIAGNOSIS — G8929 Other chronic pain: Secondary | ICD-10-CM

## 2021-10-08 DIAGNOSIS — M25512 Pain in left shoulder: Secondary | ICD-10-CM | POA: Diagnosis not present

## 2021-10-08 DIAGNOSIS — M6281 Muscle weakness (generalized): Secondary | ICD-10-CM

## 2021-10-08 DIAGNOSIS — M25612 Stiffness of left shoulder, not elsewhere classified: Secondary | ICD-10-CM

## 2021-10-08 NOTE — Therapy (Signed)
OUTPATIENT PHYSICAL THERAPY TREATMENT NOTE   Patient Name: [name] [name] MRN: [id number] DOB:1961-02-10, 61 y.o., female Today's Date: 10/09/2021  PCP: [name], MD  REFERRING PROVIDER: Meredith Pel, MD  END OF SESSION:   PT End of Session - 10/08/21 1239     Visit Number 4    Number of Visits 15    Date for PT Re-Evaluation 11/18/21    Progress Note Due on Visit 10    PT Start Time 1236    PT Stop Time 1315    PT Time Calculation (min) 39 min    Activity Tolerance Patient limited by pain;Patient tolerated treatment well    Behavior During Therapy [facility] for tasks assessed/performed               Past Medical History:  Diagnosis Date   Allergic rhinitis 05/09/2006   Allergy    Anxiety    Arthritis    Asthma    CHF (congestive heart failure) (HCC)    Chronic back pain    Diabetes mellitus without complication (HCC)    GERD (gastroesophageal reflux disease)    Heart murmur    as a child   Hepatitis C    genotype 1b, 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 s/p 12 week course of simeprevir and sofosbuvir between October 2014 and January 2015 with resolution.   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   Pneumonia    Prediabetes    Refusal of blood transfusions as patient is Jehovah's Witness    Secondary adrenal insufficiency ([place]) 06/29/2014   Urticaria    Past Surgical History:  Procedure Laterality Date   BUNIONECTOMY     b/l   COLECTOMY     2003 for diverticulitis, had colostomy bag and then reversed   COLONOSCOPY     HAND SURGERY     HAND SURGERY Right    right thumb surgery Dr Burney Gauze 2021   NASAL SINUS SURGERY     SHOULDER ARTHROSCOPY WITH OPEN ROTATOR CUFF REPAIR AND DISTAL CLAVICLE ACROMINECTOMY Left 08/31/2021   Procedure: LEFT SHOULDER ARTHROSCOPY, DEBRIDEMENT,  ROTATOR CUFF TEAR REPAIR;  Surgeon: Meredith Pel, MD;  Location: [place];  Service: Orthopedics;  Laterality: Left;   SHOULDER SURGERY     left   TONSILLECTOMY     Patient Active Problem List   Diagnosis Date Noted   Synovitis of left shoulder    Complete tear of left rotator cuff    Acute otitis media with perforated tympanic membrane, right 08/03/2021   Impingement syndrome of left shoulder 07/01/2021   Left leg swelling 07/09/2020   Grief reaction 06/08/2020   Arm pain 02/10/2020   Dizziness on standing 12/27/2019   Generalized anxiety disorder with panic attacks 06/20/2019   Osteoarthritis of thumb, right 11/29/2018   Chronic migraine w/o aura w/o status migrainosus, not intractable 08/27/2018   Disorder of left eustachian tube 05/19/2017   Vertigo of central origin 01/23/2017   Central perforation of tympanic membrane of left ear 12/09/2016   Eustachian tube dysfunction, bilateral 06/15/2016   Ear pain, right 06/15/2016   Spondylosis of lumbar region without myelopathy or radiculopathy 10/08/2015   BPPV (benign paroxysmal positional vertigo) 10/02/2015   Liver fibrosis ([place]) 12/04/2014   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[place])  08/05/2014   Vitamin D deficiency 06/29/2014   Secondary adrenal insufficiency ([place]) 06/29/2014   History of tympanostomy tube placement 02/07/2014   Refusal of blood transfusions as patient is Jehovah's Witness 11/18/2013   Hepatitis C virus infection cured after antiviral drug therapy 03/16/2012   Health care maintenance 12/15/2011   Opioid dependence ([place]) 10/05/2010   HTN (hypertension) 10/05/2010   Hyperlipidemia associated with type 2 diabetes mellitus ([place]) 10/23/2008   HIV disease ([place]) 05/09/2006   Depression 05/09/2006   Allergic rhinitis 05/09/2006   GERD 05/09/2006    REFERRING DIAG: M79.602 (ICD-10-CM) - Left arm pain.  Left shoulder arthroscopy with debridement and rotator cuff tear repair.  THERAPY DIAG:  Chronic left  shoulder pain  Muscle weakness (generalized)  Stiffness of left shoulder, not elsewhere classified  Rationale for Evaluation and Treatment Rehabilitation  Onset: 08/31/21   SUBJECTIVE:                                                                                                                                                                                       SUBJECTIVE STATEMENT: Pt reports starting a prescription for voltaren which has helped her L shoulder pain in the day. Night pain is still a problem  Pain location: L shoulder  Pain description: ache , throb, sharp Aggravating factors: L shoulder movement Relieving factors: Ice man, rest, medications Pain range 7/10   PRECAUTIONS: Other: per Dr. Randel Pigg visit note, pt may DC sling with start of PT. Pt is not to completing ligting of the L arm  From: Donella Stade, PA-C  Should be good for full active range of motion and early rotator cuff strengthening exercises starting on 10/13/2021 which will be about 6 weeks out from surgery.     PERTINENT HISTORY: left shoulder arthroscopy debridement rotator cuff tear repair 08/31/21, arthritis, anxiety, CHF, DM   WEIGHT BEARING RESTRICTIONS No   FALLS:  Has patient fallen in last 6 months? No   LIVING ENVIRONMENT: Lives with: lives with their family and lives alone Lives in: House/apartment Pt is able to access and be mobile with in her home   OCCUPATION: Disabled   PLOF: Independent   PATIENT GOALS Use it with less pain, better use with cooking and cleaning   OBJECTIVE: (objective measures completed at initial evaluation unless otherwise dated)   DIAGNOSTIC FINDINGS:  L shoulder MRI 05/25/21 IMPRESSION: 1. Chronic calcific tendinosis of the posterior supraspinatus with moderate intermediate T2 signal and thickening tendinosis of the mid to posterior supraspinatus and anterior infraspinatus. 2. Additional likely chronic calcific tendinosis of the  superior subscapularis tendon along with a moderate partial-thickness tear. 3. Status post distal  clavicle excision and acromioplasty. 4. Mild subacromial/subdeltoid bursitis. 5. Mild-to-moderate glenohumeral cartilage degenerative changes   PATIENT SURVEYS:  Quick Dash 53/55   COGNITION:           Overall cognitive status: Within functional limits for tasks assessed                                  SENSATION: WFL   POSTURE: Forward head c CT step off and dorsal hump, increased kyphosis, rounded shoulders,    UPPER EXTREMITY ROM:    Passive ROM Right 09/21/2021 Left 09/21/2021 LT 10/01/21 LT  Shoulder flexion 160 105 d, limited by pain 125, limited by pain, empty endfeel 140, limited by pain c empty end feel  Shoulder extension   115, limited by pain    Shoulder abduction 165   115, limited by pain, empty end feel 130, limited by pain c empty end feel  Shoulder adduction        Shoulder internal rotation T7      Shoulder external rotation T6  45d Limited by pain 45d, not to exceed yet   Elbow flexion        Elbow extension        Wrist flexion        Wrist extension        Wrist ulnar deviation        Wrist radial deviation        Wrist pronation        Wrist supination        Empty end feel for PROM for L shoulder flexion, abd, and ER (Blank rows = not tested)   UPPER EXTREMITY MMT:                       L not tested due to protocol   PALPATION:  TTP of the upper trap and supraspinatus region   OBSERVATION: Steri-strips covering the incision             TODAY'S TREATMENT:  [facility] Adult PT Treatment:                                                DATE: 10/08/21 Therapeutic Exercise: Shoulder pulleys flexion and scaption 2 min  Table top AAROM for flexion, abd, and ER x10  Manual Therapy: STM to peri-GH jt and scapular muscles PROM for flex/ext, abd/add, ER/IR  [facility] Adult PT Treatment:                                                DATE: 10/06/2021 Therapeutic  Exercise: Shoulder pulleys flexion and scaption x 2 min each Seated scapular retraction 2x10 Table top AAROM for flexion, abd, and ER x10  Manual Therapy: PROM for flex/ext, abd/add, ER/IR   [facility] Adult PT Treatment:                                                DATE: 10/01/21 Therapeutic Exercise: Shoulder pulleys flexion and scaption 1 min  Putty work- squeeze, opening, pinching, finger abd  Table top AAROM for flexion, abd, and ER x10  Manual Therapy: PROM for flex/ext, abd/add, ER/IR  Eavl Treatment: - Flexion-Extension Shoulder Pendulum with Table Support 10 reps - Horizontal Shoulder Pendulum with Table Support 10 reps - Seated Shoulder Flexion Slide at Table Top with Forearm in Neutral 5 reps - 5 hold - Seated Shoulder Scaption Slide at Table Top with Forearm in Neutral 5 reps - 5 hold - Seated Shoulder External Rotation PROM on Table 5 reps - 5 hold     PATIENT EDUCATION: Education details: Eval findings, POC, HEP Person educated: Patient Education method: Explanation, Demonstration, Tactile cues, Verbal cues, and Handouts Education comprehension: verbalized understanding, returned demonstration, verbal cues required, and tactile cues required     HOME EXERCISE PROGRAM: Access Code: [email or web address] URL: [email or web address] Date: 09/21/2021 Prepared by: Gar Ponto   Exercises - Flexion-Extension Shoulder Pendulum with Table Support  - 2 x daily - 7 x weekly - 2 sets - 10 reps - Horizontal Shoulder Pendulum with Table Support  - 2 x daily - 7 x weekly - 2 sets - 10 reps - Seated Shoulder Flexion Slide at Table Top with Forearm in Neutral  - 2 x daily - 7 x weekly - 1 sets - 5 reps - 5 hold - Seated Shoulder Scaption Slide at Table Top with Forearm in Neutral  - 2 x daily - 7 x weekly - 1 sets - 5 reps - 5 hold - Seated Shoulder External Rotation PROM on Table  - 2 x daily - 7 x weekly - 1 sets - 5 reps - 5 hold   ASSESSMENT:   CLINICAL IMPRESSION: E PT was  completed for STM to L peri-GH and scapular muscles to assit with pain relief. Continue PROM bty PT and AAROM therex by pt. PROM of the L shoulder continues to improve. Pt continues to have a high level of pain. Pt will continue to benefit from skilled PT to optimize function of the L shoulder/Arm. Will initiate isomentric strengthening exs the next PT session.   OBJECTIVE IMPAIRMENTS decreased ROM, decreased strength, impaired UE functional use, postural dysfunction, and pain.    ACTIVITY LIMITATIONS carrying, lifting, sleeping, dressing, and reach over head   PARTICIPATION LIMITATIONS: meal prep, cleaning, laundry, driving, shopping, community activity, and yard work   PERSONAL FACTORS Past/current experiences, Time since onset of injury/illness/exacerbation, and 1 comorbidity: DM  are also affecting patient's functional outcome.    REHAB POTENTIAL: Good   CLINICAL DECISION MAKING: Stable/uncomplicated   EVALUATION COMPLEXITY: Low     GOALS:   SHORT TERM GOALS: Target date: 10/12/2021  (Remove Blue Hyperlink)   Pt will be Ind in an initial HEP Baseline:started on eval Goal status: INITIAL   2.  Increase L shoulder flexion PROM to 125d and abd to 135d to progress to proper R shoulder function Baseline: flex= 105d and abd= 115d Goal status: INITIAL   LONG TERM GOALS: Target date: 11/19/21   Increase L shoulder strength to 4 or > for improved R shoulder function Baseline: NT Goal status: INITIAL   2.  Increase L shoulder AROM to at least 85% of the R shoulder to complete above shoulder reaching  Baseline: See flow sheet Goal status: INITIAL   3. Decrease L shoulder pain to 3/10 or less for improved L UE function and QOL  Baseline: 4-10/10 Goal status: INITIAL   4.  Pt will complete L shoulder above shoulder reaching c min to  zero scapular shrug Baseline:  Goal status: INITIAL   5.  Pt will score 27 or less with the UE Quickdash as indication of improved function Baseline:  53 Goal status: INITIAL   6.  Pt will be Ind in a final HEP to maintain achieved LOF and QOL Baselinestarted on eval  Goal status: INITIAL     PLAN: PT FREQUENCY: 1w2, 2w6   PT DURATION: 8 weeks   PLANNED INTERVENTIONS: Therapeutic exercises, Therapeutic activity, Neuromuscular re-education, Balance training, Gait training, Patient/Family education, Joint mobilization, Aquatic Therapy, Dry Needling, Electrical stimulation, Cryotherapy, Moist heat, Taping, Vasopneumatic device, Ultrasound, Ionotophoresis '4mg'$ /ml Dexamethasone, Manual therapy, and Re-evaluation   PLAN FOR NEXT SESSION: Assess response to HEP. Progress PROM and AAROM as tolerated. Initiate isometric strengthening exs.  Nastacia Raybuck MS, PT 10/09/21 9:13 AM

## 2021-10-12 ENCOUNTER — Ambulatory Visit: Payer: Medicaid Other | Admitting: Physical Therapy

## 2021-10-13 ENCOUNTER — Other Ambulatory Visit: Payer: Self-pay | Admitting: Obstetrics and Gynecology

## 2021-10-13 NOTE — Patient Instructions (Signed)
Hi Ms. Ramiro, I am glad your PT is progressing, have a nice day!  Ms. Zinni was given information about Medicaid Managed Care team care coordination services as a part of their [place] Medicaid benefit. [name] verbally consented to engagement with the [facility] Managed Care team.   If you are experiencing a medical emergency, please call 911 or report to your local emergency department or urgent care.   If you have a non-emergency medical problem during routine business hours, please contact your provider's office and ask to speak with a nurse.   For questions related to your [facility], please call: [phone] or visit the homepage here: [email or web address]  If you would like to schedule transportation through your [facility], please call the following number at least 2 days in advance of your appointment: [phone].  Rides for urgent appointments can also be made after hours by calling Member Services.  Call the [place] at [phone], at any time, 24 hours a day, 7 days a week. If you are in danger or need immediate medical attention call 911.  If you would like help to quit smoking, call 1-800-QUIT-NOW 260-412-9081) OR Espaol: 1-855-Djelo-Ya (0-962-836-6294) o para ms informacin haga clic aqu or Text READY to 200-400 to register via text  Ms. Ferreri - following are the goals we discussed in your visit today:   Goals Addressed             This Visit's Progress    Protect My Health       Timeframe:  Long-Range Goal Priority:  Medium Start Date:      05/07/20                       Expected End Date: ongoing             Follow Up Date: 11/18/21  - schedule appointment for vaccines needed due to my age or health - schedule recommended health tests (blood work, mammogram, colonoscopy, pap test) - schedule and keep  appointment for annual check-up    Why is this important?   Screening tests can find diseases early when they are easier to treat.  Your doctor or nurse will talk with you about which tests are important for you.  Getting shots for common diseases like the flu and shingles will help prevent them.     6;21;23:  Patient attending PT twice a week.  Has appt with Dr. Buddy Duty 11/15/21   Patient verbalizes understanding of instructions and care plan provided today and agrees to view in [place]. Active MyChart status and patient understanding of how to access instructions and care plan via MyChart confirmed with patient.     The Managed Medicaid care management team will reach out to the patient again over the next 30 business  days.  The  Patient has been provided with contact information for the Managed Medicaid care management team and has been advised to call with any health related questions or concerns.   Aida Raider RN, BSN [place] Management Coordinator - Managed Medicaid High Risk [phone]   Following is a copy of your plan of care:  Care Plan : General Plan of Care (Adult)  Updates made by Gayla Medicus, RN since 10/13/2021 12:00 AM     Problem: Health Promotion or Disease Self-Management (General Plan of Care)   Priority: Medium  Onset  Date: 08/10/2020     Long-Range Goal: Self-Management Plan Developed   Start Date: 05/07/2020  Expected End Date: 12/11/2021  Recent Progress: On track  Priority: Medium  Note:   Current Barriers:  Chronic Disease Management support and education needs.  10/13/21:  Patient continues PT 2 times a week for left shoulder.  Continues to utilize [facility] transportation with success.  BP and CBG WNL.  No problems today.  Nurse Case Manager Clinical Goal(s):  Over the next 30 days, patient will attend all scheduled medical appointments  Interventions:  Inter-disciplinary care team collaboration (see longitudinal plan  of care) Evaluation of current treatment plan and patient's adherence to plan as established by provider. Reviewed medications with patient. Collaborated with pharmacy regarding medications. Discussed plans with patient for ongoing care management follow up and provided patient with direct contact information for care management team Reviewed scheduled/upcoming provider appointments. Pharmacy referral for medication review-completed. Patient provided with [facility] transportation information.  Patient Goals/Self-Care Activities Over the next 30 days, patient will:  -Attends all scheduled provider appointments Calls pharmacy for medication refills Calls provider office for new concerns or questions  Follow Up Plan: The Managed Medicaid care management team will reach out to the patient again over the next 30 days.  The patient has been provided with contact information for the Managed Medicaid care management team and has been advised to call with any health related questions or concerns.

## 2021-10-13 NOTE — Patient Outreach (Signed)
Medicaid Managed Care   Nurse Care Manager Note  10/13/2021 Name:  [name] [name] MRN:  [id number] DOB:  [birth year]  [name] [name] is an 61 y.o. year old female who is a primary patient of [name], MD.  [facility] Managed Care Coordination team was consulted for assistance with:    Chronic healthcare management needs, DM, HTN, HIV, asthma, GERD, anxiety, osteoarthritis, headaches  [name] was given information about Medicaid Managed Care Coordination team services today. [name] [name] Patient agreed to services and verbal consent obtained.  Engaged with patient by telephone for follow up visit in response to provider referral for case management and/or care coordination services.   Assessments/Interventions:  Review of past medical history, allergies, medications, health status, including review of consultants reports, laboratory and other test data, was performed as part of comprehensive evaluation and provision of chronic care management services.  SDOH (Social Determinants of Health) assessments and interventions performed:  Care Plan  Allergies  Allergen Reactions   Acetaminophen Other (See Comments)    Inflamed liver, hospitalized     Morphine Sulfate Hives and Shortness Of Breath   Triamterene Hives   Aspirin-Caffeine Other (See Comments)    liver damage; upset stomach   Dyazide [Hydrochlorothiazide W-Triamterene] Hives   Jardiance [Empagliflozin] Diarrhea    (Jardiance) stomach ache    Lisinopril     Cough    Metformin Hcl Er Other (See Comments)     upset stomach    Topamax [Topiramate] Other (See Comments)    Vision disturbances.   Citalopram Itching and Rash   Emtricitabine-Tenofovir Df Rash     Descovy   Losartan Nausea Only and Rash    Pt had rash, worsening dizziness, and nausea after starting losartan, which improved after stopping losartan   Triamterene-Hctz Rash   Medications Reviewed Today     Reviewed by Gayla Medicus, RN (Registered  Nurse) on 10/13/21 at [place] List Status: <None>   Medication Order Taking? Sig Documenting Provider Last Dose Status Informant  albuterol (VENTOLIN HFA) 108 (90 Base) MCG/ACT inhaler [id number]  Inhale 2 puffs into the lungs every 4 (four) hours as needed for wheezing or shortness of breath. Kozlow, Donnamarie Poag, MD  Active   amLODipine (NORVASC) 10 MG tablet [id number] No Take 1 tablet (10 mg total) by mouth daily. [name], MD Taking Active Self  atorvastatin (LIPITOR) 10 MG tablet [id number] No TAKE HALF TABLET BY MOUTH DAILY Katsadouros, Vasilios, MD Taking Active Self  bictegravir-emtricitabine-tenofovir AF (BIKTARVY) 50-200-25 MG TABS tablet [id number] No Take 1 tablet by mouth daily. Baker Callas, NP Taking Active Self  buPROPion (WELLBUTRIN XL) 300 MG 24 hr tablet [id number] No TAKE ONE TABLET BY MOUTH DAILY  Patient taking differently: Take 300 mg by mouth every evening.   [name], MD Taking Active Self  cetirizine (ZYRTEC) 10 MG tablet [id number]  Take 2 tablets (20 mg total) by mouth 2 (two) times daily as needed for allergies. Kozlow, Donnamarie Poag, MD  Active   chlorhexidine (PERIDEX) 0.12 % solution [id number] No Use as directed 15 mLs in the mouth or throat 2 (two) times daily. [place] Callas, NP Taking Active Self  cholecalciferol (VITAMIN D) 25 MCG (1000 UNIT) tablet [id number]  Take 1 tablet (1,000 Units total) by mouth daily. [name], MD  Active   cyclobenzaprine (FLEXERIL) 10 MG tablet [id number] No Take 1 tablet (10 mg total) by mouth 3 (three) times daily as needed for muscle spasms. Magnant, Juanda Crumble  L, PA-C Taking Active Self  diclofenac (VOLTAREN) 75 MG EC tablet [id number]  Take 1 tablet (75 mg total) by mouth 2 (two) times daily. Do not take with other NSAIDs such as ibuprofen Magnant, Gerrianne Scale, PA-C  Active   diphenoxylate-atropine (LOMOTIL) 2.5-0.025 MG tablet [id number] No Take 1 tablet by mouth 4 (four) times daily as needed for diarrhea or loose  stools. [place] Callas, NP Taking Active Self  famotidine (PEPCID) 40 MG tablet [id number]  Take 1 tablet (40 mg total) by mouth at bedtime. Kozlow, Donnamarie Poag, MD  Active   hydrocortisone (CORTEF) 5 MG tablet [id number] No Take 2.5-5 mg by mouth See admin instructions. Take 1 tablet (5 mg) by mouth in the morning (scheduled) & may taken an additional 0.5 tablet (2.5 mg) by mouth in the evening if needed. [provider] Taking Active Self  liraglutide (VICTOZA) 18 MG/3ML SOPN [id number] No Inject 1.2 mg into the skin every evening. [provider] Taking Active Self  methocarbamol (ROBAXIN) 500 MG tablet [id number]  Take 1 tablet (500 mg total) by mouth every 8 (eight) hours as needed for muscle spasms. Meredith Pel, MD  Active   metoprolol tartrate (LOPRESSOR) 25 MG tablet [id number] No Take 1 tablet (25 mg total) by mouth 2 (two) times daily. [name], MD Taking Active Self  montelukast (SINGULAIR) 10 MG tablet [id number]  Take 1 tablet (10 mg total) by mouth at bedtime. Jiles Prows, MD  Active   NEURONTIN 100 MG capsule [id number]  Take 2 capsules (200 mg total) by mouth 3 (three) times daily. Kozlow, Donnamarie Poag, MD  Active   nitroGLYCERIN (NITROSTAT) 0.4 MG SL tablet [id number] No Place 1 tablet (0.4 mg total) under the tongue every 5 (five) minutes as needed for chest pain. Isaiah Serge, NP Taking Active Self  nortriptyline (PAMELOR) 10 MG capsule [id number] No Take 2 capsules (20 mg total) by mouth at bedtime. Marcial Pacas, MD Taking Active Self  pantoprazole (PROTONIX) 40 MG tablet [id number]  Take 1 tablet (40 mg total) by mouth 2 (two) times daily. Kozlow, Donnamarie Poag, MD  Active   potassium chloride SA (KLOR-CON) 20 MEQ tablet [id number] No Take 20 mEq by mouth in the morning. [provider] Taking Active Self  RESTASIS 0.05 % ophthalmic emulsion [id number] No Place 1 drop into both eyes 2 (two) times daily. [provider] Taking Active Self  sodium  chloride (OCEAN) 0.65 % SOLN nasal spray [id number] No PLACE 1 SPRAY INTO BOTH NOSTRILS AS NEEDED FOR CONGESTION. [name], DO Taking Active Self  SUMAtriptan (IMITREX) 25 MG tablet [id number] No Take 1 tablet (25 mg total) by mouth every 2 (two) hours as needed for migraine. May repeat in 2 hours if headache persists or recurs. Marcial Pacas, MD Taking Active Self  SYMBICORT 160-4.5 MCG/ACT inhaler [id number]  Inhale 2 puffs into the lungs in the morning and at bedtime. Kozlow, Donnamarie Poag, MD  Active   traMADol Veatrice Bourbon) 50 MG tablet [id number] No Take 2 tablets (100 mg total) by mouth every 6 (six) hours as needed. Magnant, Gerrianne Scale, PA-C Taking Active   triamcinolone (NASACORT) 55 MCG/ACT AERO nasal inhaler [id number]  Place 1 spray into the nose 2 (two) times daily. 1 spray each nostril 2 times per day Jiles Prows, MD  Active            Patient Active Problem List   Diagnosis Date Noted   Synovitis of left shoulder  Complete tear of left rotator cuff    Acute otitis media with perforated tympanic membrane, right 08/03/2021   Impingement syndrome of left shoulder 07/01/2021   Left leg swelling 07/09/2020   Grief reaction 06/08/2020   Arm pain 02/10/2020   Dizziness on standing 12/27/2019   Generalized anxiety disorder with panic attacks 06/20/2019   Osteoarthritis of thumb, right 11/29/2018   Chronic migraine w/o aura w/o status migrainosus, not intractable 08/27/2018   Disorder of left eustachian tube 05/19/2017   Vertigo of central origin 01/23/2017   Central perforation of tympanic membrane of left ear 12/09/2016   Eustachian tube dysfunction, bilateral 06/15/2016   Ear pain, right 06/15/2016   Spondylosis of lumbar region without myelopathy or radiculopathy 10/08/2015   BPPV (benign paroxysmal positional vertigo) 10/02/2015   Liver fibrosis ([place]) 12/04/2014   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   Vitamin D  deficiency 06/29/2014   Secondary adrenal insufficiency ([place]) 06/29/2014   History of tympanostomy tube placement 02/07/2014   Refusal of blood transfusions as patient is Jehovah's Witness 11/18/2013   Hepatitis C virus infection cured after antiviral drug therapy 03/16/2012   Health care maintenance 12/15/2011   Opioid dependence ([place]) 10/05/2010   HTN (hypertension) 10/05/2010   Hyperlipidemia associated with type 2 diabetes mellitus ([place]) 10/23/2008   HIV disease ([place]) 05/09/2006   Depression 05/09/2006   Allergic rhinitis 05/09/2006   GERD 05/09/2006   Conditions to be addressed/monitored per PCP order:  Chronic healthcare management needs, DM, HTN, HIV, asthma, GERD, anxiety, osteoarthritis, headaches. HLD, hearing loss  Care Plan : General Plan of Care (Adult)  Updates made by Gayla Medicus, RN since 10/13/2021 12:00 AM     Problem: Health Promotion or Disease Self-Management (General Plan of Care)   Priority: Medium  Onset Date: 08/10/2020     Long-Range Goal: Self-Management Plan Developed   Start Date: 05/07/2020  Expected End Date: 12/11/2021  Recent Progress: On track  Priority: Medium  Note:   Current Barriers:  Chronic Disease Management support and education needs.  10/13/21:  Patient continues PT 2 times a week for left shoulder.  Continues to utilize [facility] transportation with success.  BP and CBG WNL.  No problems today.  Nurse Case Manager Clinical Goal(s):  Over the next 30 days, patient will attend all scheduled medical appointments  Interventions:  Inter-disciplinary care team collaboration (see longitudinal plan of care) Evaluation of current treatment plan and patient's adherence to plan as established by provider. Reviewed medications with patient. Collaborated with pharmacy regarding medications. Discussed plans with patient for ongoing care management follow up and provided patient with direct contact information for care management team Reviewed  scheduled/upcoming provider appointments. Pharmacy referral for medication review-completed. Patient provided with [facility] transportation information.  Patient Goals/Self-Care Activities Over the next 30 days, patient will:  -Attends all scheduled provider appointments Calls pharmacy for medication refills Calls provider office for new concerns or questions  Follow Up Plan: The Managed Medicaid care management team will reach out to the patient again over the next 30 days.  The patient has been provided with contact information for the Managed Medicaid care management team and has been advised to call with any health related questions or concerns.    Follow Up:  Patient agrees to Care Plan and Follow-up.  Plan: The Managed Medicaid care management team will reach out to the patient again over the next 30 days. and The  Patient has been provided with contact information  for the Managed Medicaid care management team and has been advised to call with any health related questions or concerns.  Date/time of next scheduled RN care management/care coordination outreach: 11/18/21 at 315

## 2021-10-13 NOTE — Therapy (Incomplete)
OUTPATIENT PHYSICAL THERAPY TREATMENT NOTE   Patient Name: [name] [name] MRN: [id number] DOB:Aug 05, 1960, 61 y.o., female Today's Date: 10/13/2021  PCP: [name], MD  REFERRING PROVIDER: Meredith Pel, MD  END OF SESSION:       Past Medical History:  Diagnosis Date   Allergic rhinitis 05/09/2006   Allergy    Anxiety    Arthritis    Asthma    CHF (congestive heart failure) ([place])    Chronic back pain    Diabetes mellitus without complication ([place])    GERD (gastroesophageal reflux disease)    Heart murmur    as a child   Hepatitis C    genotype 1b, 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 s/p 12 week course of simeprevir and sofosbuvir between October 2014 and January 2015 with resolution.   History of shingles    HIV infection ([place])    1994   Hyperlipidemia    no meds taken now   Hypertension    Migraine    Pituitary microadenoma ([place]) 08/08/2014   Pneumonia    Prediabetes    Refusal of blood transfusions as patient is Jehovah's Witness    Secondary adrenal insufficiency ([place]) 06/29/2014   Urticaria    Past Surgical History:  Procedure Laterality Date   BUNIONECTOMY     b/l   COLECTOMY     2003 for diverticulitis, had colostomy bag and then reversed   COLONOSCOPY     HAND SURGERY     HAND SURGERY Right    right thumb surgery Dr Burney Gauze 2021   NASAL SINUS SURGERY     SHOULDER ARTHROSCOPY WITH OPEN ROTATOR CUFF REPAIR AND DISTAL CLAVICLE ACROMINECTOMY Left 08/31/2021   Procedure: LEFT SHOULDER ARTHROSCOPY, DEBRIDEMENT,  ROTATOR CUFF TEAR REPAIR;  Surgeon: Meredith Pel, MD;  Location: [facility];  Service: Orthopedics;  Laterality: Left;   SHOULDER SURGERY     left   TONSILLECTOMY     Patient Active Problem List   Diagnosis Date Noted   Synovitis of left shoulder    Complete tear of left rotator cuff    Acute otitis media with perforated tympanic membrane, right 08/03/2021   Impingement syndrome of left shoulder 07/01/2021   Left  leg swelling 07/09/2020   Grief reaction 06/08/2020   Arm pain 02/10/2020   Dizziness on standing 12/27/2019   Generalized anxiety disorder with panic attacks 06/20/2019   Osteoarthritis of thumb, right 11/29/2018   Chronic migraine w/o aura w/o status migrainosus, not intractable 08/27/2018   Disorder of left eustachian tube 05/19/2017   Vertigo of central origin 01/23/2017   Central perforation of tympanic membrane of left ear 12/09/2016   Eustachian tube dysfunction, bilateral 06/15/2016   Ear pain, right 06/15/2016   Spondylosis of lumbar region without myelopathy or radiculopathy 10/08/2015   BPPV (benign paroxysmal positional vertigo) 10/02/2015   Liver fibrosis ([place]) 12/04/2014   Pituitary microadenoma ([place]) 08/08/2014   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   Vitamin D deficiency 06/29/2014   Secondary adrenal insufficiency ([place]) 06/29/2014   History of tympanostomy tube placement 02/07/2014   Refusal of blood transfusions as patient is Jehovah's Witness 11/18/2013   Hepatitis C virus infection cured after antiviral drug therapy 03/16/2012   Health care maintenance 12/15/2011   Opioid dependence ([place]) 10/05/2010   HTN (hypertension) 10/05/2010   Hyperlipidemia associated with type 2 diabetes mellitus ([place]) 10/23/2008   HIV disease ([place]) 05/09/2006   Depression 05/09/2006   Allergic rhinitis 05/09/2006   GERD 05/09/2006  REFERRING DIAG: M79.602 (ICD-10-CM) - Left arm pain.  Left shoulder arthroscopy with debridement and rotator cuff tear repair.  THERAPY DIAG:  No diagnosis found.  Rationale for Evaluation and Treatment Rehabilitation  Onset: 08/31/21   SUBJECTIVE:                                                                                                                                                                                       SUBJECTIVE STATEMENT: Pt reports starting a prescription for voltaren which has helped her L shoulder pain in  the day. Night pain is still a problem  Pain location: L shoulder  Pain description: ache , throb, sharp Aggravating factors: L shoulder movement Relieving factors: Ice man, rest, medications Pain range 7/10   PRECAUTIONS: Other: per Dr. Randel Pigg visit note, pt may DC sling with start of PT. Pt is not to completing ligting of the L arm  From: Donella Stade, PA-C  Should be good for full active range of motion and early rotator cuff strengthening exercises starting on 10/13/2021 which will be about 6 weeks out from surgery.     PERTINENT HISTORY: left shoulder arthroscopy debridement rotator cuff tear repair 08/31/21, arthritis, anxiety, CHF, DM   WEIGHT BEARING RESTRICTIONS No   FALLS:  Has patient fallen in last 6 months? No   LIVING ENVIRONMENT: Lives with: lives with their family and lives alone Lives in: House/apartment Pt is able to access and be mobile with in her home   OCCUPATION: Disabled   PLOF: Independent   PATIENT GOALS Use it with less pain, better use with cooking and cleaning   OBJECTIVE: (objective measures completed at initial evaluation unless otherwise dated)   DIAGNOSTIC FINDINGS:  L shoulder MRI 05/25/21 IMPRESSION: 1. Chronic calcific tendinosis of the posterior supraspinatus with moderate intermediate T2 signal and thickening tendinosis of the mid to posterior supraspinatus and anterior infraspinatus. 2. Additional likely chronic calcific tendinosis of the superior subscapularis tendon along with a moderate partial-thickness tear. 3. Status post distal clavicle excision and acromioplasty. 4. Mild subacromial/subdeltoid bursitis. 5. Mild-to-moderate glenohumeral cartilage degenerative changes   PATIENT SURVEYS:  Quick Dash 53/55   COGNITION:           Overall cognitive status: Within functional limits for tasks assessed                                  SENSATION: WFL   POSTURE: Forward head c CT step off and dorsal hump, increased  kyphosis, rounded shoulders,    UPPER EXTREMITY ROM:    Passive ROM Right 09/21/2021 Left  09/21/2021 LT 10/01/21 LT  Shoulder flexion 160 105 d, limited by pain 125, limited by pain, empty endfeel 140, limited by pain c empty end feel  Shoulder extension   115, limited by pain    Shoulder abduction 165   115, limited by pain, empty end feel 130, limited by pain c empty end feel  Shoulder adduction        Shoulder internal rotation T7      Shoulder external rotation T6  45d Limited by pain 45d, not to exceed yet   Elbow flexion        Elbow extension        Wrist flexion        Wrist extension        Wrist ulnar deviation        Wrist radial deviation        Wrist pronation        Wrist supination        Empty end feel for PROM for L shoulder flexion, abd, and ER (Blank rows = not tested)   UPPER EXTREMITY MMT:                       L not tested due to protocol   PALPATION:  TTP of the upper trap and supraspinatus region   OBSERVATION: Steri-strips covering the incision             TODAY'S TREATMENT:  [facility] Adult PT Treatment:                                                DATE: 10/14/21 Therapeutic Exercise: *** Manual Therapy: *** Neuromuscular re-ed: *** Therapeutic Activity: *** Modalities: *** Self Care: ***  [facility] Adult PT Treatment:                                                DATE: 10/08/21 Therapeutic Exercise: Shoulder pulleys flexion and scaption 2 min  Table top AAROM for flexion, abd, and ER x10  Manual Therapy: STM to peri-GH jt and scapular muscles PROM for flex/ext, abd/add, ER/IR  [facility] Adult PT Treatment:                                                DATE: 10/06/2021 Therapeutic Exercise: Shoulder pulleys flexion and scaption x 2 min each Seated scapular retraction 2x10 Table top AAROM for flexion, abd, and ER x10  Manual Therapy: PROM for flex/ext, abd/add, ER/IR   [facility] Adult PT Treatment:                                                DATE:  10/01/21 Therapeutic Exercise: Shoulder pulleys flexion and scaption 1 min  Putty work- squeeze, opening, pinching, finger abd  Table top AAROM for flexion, abd, and ER x10  Manual Therapy: PROM for flex/ext, abd/add, ER/IR  Eavl Treatment: - Flexion-Extension Shoulder Pendulum with Table Support 10 reps -  Horizontal Shoulder Pendulum with Table Support 10 reps - Seated Shoulder Flexion Slide at Table Top with Forearm in Neutral 5 reps - 5 hold - Seated Shoulder Scaption Slide at Table Top with Forearm in Neutral 5 reps - 5 hold - Seated Shoulder External Rotation PROM on Table 5 reps - 5 hold     PATIENT EDUCATION: Education details: Eval findings, POC, HEP Person educated: Patient Education method: Explanation, Demonstration, Tactile cues, Verbal cues, and Handouts Education comprehension: verbalized understanding, returned demonstration, verbal cues required, and tactile cues required     HOME EXERCISE PROGRAM: Access Code: [email or web address] URL: [email or web address] Date: 09/21/2021 Prepared by: Gar Ponto   Exercises - Flexion-Extension Shoulder Pendulum with Table Support  - 2 x daily - 7 x weekly - 2 sets - 10 reps - Horizontal Shoulder Pendulum with Table Support  - 2 x daily - 7 x weekly - 2 sets - 10 reps - Seated Shoulder Flexion Slide at Table Top with Forearm in Neutral  - 2 x daily - 7 x weekly - 1 sets - 5 reps - 5 hold - Seated Shoulder Scaption Slide at Table Top with Forearm in Neutral  - 2 x daily - 7 x weekly - 1 sets - 5 reps - 5 hold - Seated Shoulder External Rotation PROM on Table  - 2 x daily - 7 x weekly - 1 sets - 5 reps - 5 hold   ASSESSMENT:   CLINICAL IMPRESSION: E PT was completed for STM to L peri-GH and scapular muscles to assit with pain relief. Continue PROM bty PT and AAROM therex by pt. PROM of the L shoulder continues to improve. Pt continues to have a high level of pain. Pt will continue to benefit from skilled PT to optimize function  of the L shoulder/Arm. Will initiate isomentric strengthening exs the next PT session.   OBJECTIVE IMPAIRMENTS decreased ROM, decreased strength, impaired UE functional use, postural dysfunction, and pain.    ACTIVITY LIMITATIONS carrying, lifting, sleeping, dressing, and reach over head   PARTICIPATION LIMITATIONS: meal prep, cleaning, laundry, driving, shopping, community activity, and yard work   PERSONAL FACTORS Past/current experiences, Time since onset of injury/illness/exacerbation, and 1 comorbidity: DM  are also affecting patient's functional outcome.    REHAB POTENTIAL: Good   CLINICAL DECISION MAKING: Stable/uncomplicated   EVALUATION COMPLEXITY: Low     GOALS:   SHORT TERM GOALS: Target date: 10/12/2021  (Remove Blue Hyperlink)   Pt will be Ind in an initial HEP Baseline:started on eval Goal status: INITIAL   2.  Increase L shoulder flexion PROM to 125d and abd to 135d to progress to proper R shoulder function Baseline: flex= 105d and abd= 115d Goal status: INITIAL   LONG TERM GOALS: Target date: 11/19/21   Increase L shoulder strength to 4 or > for improved R shoulder function Baseline: NT Goal status: INITIAL   2.  Increase L shoulder AROM to at least 85% of the R shoulder to complete above shoulder reaching  Baseline: See flow sheet Goal status: INITIAL   3. Decrease L shoulder pain to 3/10 or less for improved L UE function and QOL  Baseline: 4-10/10 Goal status: INITIAL   4.  Pt will complete L shoulder above shoulder reaching c min to zero scapular shrug Baseline:  Goal status: INITIAL   5.  Pt will score 27 or less with the UE Quickdash as indication of improved function Baseline: 53 Goal status: INITIAL   6.  Pt will  be Ind in a final HEP to maintain achieved LOF and QOL Baselinestarted on eval  Goal status: INITIAL     PLAN: PT FREQUENCY: 1w2, 2w6   PT DURATION: 8 weeks   PLANNED INTERVENTIONS: Therapeutic exercises, Therapeutic  activity, Neuromuscular re-education, Balance training, Gait training, Patient/Family education, Joint mobilization, Aquatic Therapy, Dry Needling, Electrical stimulation, Cryotherapy, Moist heat, Taping, Vasopneumatic device, Ultrasound, Ionotophoresis '4mg'$ /ml Dexamethasone, Manual therapy, and Re-evaluation   PLAN FOR NEXT SESSION: Assess response to HEP. Progress PROM and AAROM as tolerated. Initiate isometric strengthening exs.  Ovid Witman MS, PT 10/13/21 9:30 PM

## 2021-10-14 ENCOUNTER — Ambulatory Visit: Payer: Medicaid Other

## 2021-10-14 DIAGNOSIS — M25512 Pain in left shoulder: Secondary | ICD-10-CM | POA: Diagnosis not present

## 2021-10-14 DIAGNOSIS — M6281 Muscle weakness (generalized): Secondary | ICD-10-CM

## 2021-10-14 DIAGNOSIS — M25612 Stiffness of left shoulder, not elsewhere classified: Secondary | ICD-10-CM

## 2021-10-14 DIAGNOSIS — G8929 Other chronic pain: Secondary | ICD-10-CM

## 2021-10-15 ENCOUNTER — Ambulatory Visit: Payer: Medicaid Other | Admitting: Physical Therapy

## 2021-10-15 DIAGNOSIS — H6983 Other specified disorders of Eustachian tube, bilateral: Secondary | ICD-10-CM | POA: Diagnosis not present

## 2021-10-15 DIAGNOSIS — H7293 Unspecified perforation of tympanic membrane, bilateral: Secondary | ICD-10-CM | POA: Diagnosis not present

## 2021-10-15 DIAGNOSIS — H906 Mixed conductive and sensorineural hearing loss, bilateral: Secondary | ICD-10-CM | POA: Diagnosis not present

## 2021-10-18 ENCOUNTER — Other Ambulatory Visit: Payer: Medicaid Other | Admitting: Obstetrics and Gynecology

## 2021-10-18 ENCOUNTER — Ambulatory Visit: Payer: Medicaid Other | Admitting: Physical Therapy

## 2021-10-20 ENCOUNTER — Ambulatory Visit: Payer: Medicaid Other

## 2021-10-20 ENCOUNTER — Other Ambulatory Visit: Payer: Self-pay | Admitting: Obstetrics and Gynecology

## 2021-10-20 DIAGNOSIS — G8929 Other chronic pain: Secondary | ICD-10-CM

## 2021-10-20 DIAGNOSIS — M25512 Pain in left shoulder: Secondary | ICD-10-CM | POA: Diagnosis not present

## 2021-10-20 DIAGNOSIS — M25612 Stiffness of left shoulder, not elsewhere classified: Secondary | ICD-10-CM

## 2021-10-20 DIAGNOSIS — M6281 Muscle weakness (generalized): Secondary | ICD-10-CM

## 2021-10-20 NOTE — Therapy (Signed)
OUTPATIENT PHYSICAL THERAPY TREATMENT NOTE   Patient Name: [name] [name] MRN: [id number] DOB:17-May-1960, 61 y.o., female Today's Date: 10/20/2021  PCP: [name], MD  REFERRING PROVIDER: Meredith Pel, MD  END OF SESSION:   PT End of Session - 10/20/21 1113     Visit Number 6    Number of Visits 15    Date for PT Re-Evaluation 11/18/21    Progress Note Due on Visit 10    PT Start Time 1105    PT Stop Time 1155    PT Time Calculation (min) 50 min    Activity Tolerance Patient limited by pain;Patient tolerated treatment well    Behavior During Therapy [facility] for tasks assessed/performed                 Past Medical History:  Diagnosis Date   Allergic rhinitis 05/09/2006   Allergy    Anxiety    Arthritis    Asthma    CHF (congestive heart failure) (HCC)    Chronic back pain    Diabetes mellitus without complication (HCC)    GERD (gastroesophageal reflux disease)    Heart murmur    as a child   Hepatitis C    genotype 1b, 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 s/p 12 week course of simeprevir and sofosbuvir between October 2014 and January 2015 with resolution.   History of shingles    HIV infection ([place])    1994   Hyperlipidemia    no meds taken now   Hypertension    Migraine    Pituitary microadenoma ([place]) 08/08/2014   Pneumonia    Prediabetes    Refusal of blood transfusions as patient is Jehovah's Witness    Secondary adrenal insufficiency ([place]) 06/29/2014   Urticaria    Past Surgical History:  Procedure Laterality Date   BUNIONECTOMY     b/l   COLECTOMY     2003 for diverticulitis, had colostomy bag and then reversed   COLONOSCOPY     HAND SURGERY     HAND SURGERY Right    right thumb surgery Dr Burney Gauze 2021   NASAL SINUS SURGERY     SHOULDER ARTHROSCOPY WITH OPEN ROTATOR CUFF REPAIR AND DISTAL CLAVICLE ACROMINECTOMY Left 08/31/2021   Procedure: LEFT SHOULDER ARTHROSCOPY, DEBRIDEMENT,  ROTATOR CUFF TEAR REPAIR;  Surgeon:  Meredith Pel, MD;  Location: [place];  Service: Orthopedics;  Laterality: Left;   SHOULDER SURGERY     left   TONSILLECTOMY     Patient Active Problem List   Diagnosis Date Noted   Synovitis of left shoulder    Complete tear of left rotator cuff    Acute otitis media with perforated tympanic membrane, right 08/03/2021   Impingement syndrome of left shoulder 07/01/2021   Left leg swelling 07/09/2020   Grief reaction 06/08/2020   Arm pain 02/10/2020   Dizziness on standing 12/27/2019   Generalized anxiety disorder with panic attacks 06/20/2019   Osteoarthritis of thumb, right 11/29/2018   Chronic migraine w/o aura w/o status migrainosus, not intractable 08/27/2018   Disorder of left eustachian tube 05/19/2017   Vertigo of central origin 01/23/2017   Central perforation of tympanic membrane of left ear 12/09/2016   Eustachian tube dysfunction, bilateral 06/15/2016   Ear pain, right 06/15/2016   Spondylosis of lumbar region without myelopathy or radiculopathy 10/08/2015   BPPV (benign paroxysmal positional vertigo) 10/02/2015   Liver fibrosis ([place]) 12/04/2014   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/08/2014   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   Vitamin D deficiency 06/29/2014   Secondary adrenal insufficiency ([place]) 06/29/2014   History of tympanostomy tube placement 02/07/2014   Refusal of blood transfusions as patient is Jehovah's Witness 11/18/2013   Hepatitis C virus infection cured after antiviral drug therapy 03/16/2012   Health care maintenance 12/15/2011   Opioid dependence ([place]) 10/05/2010   HTN (hypertension) 10/05/2010   Hyperlipidemia associated with type 2 diabetes mellitus ([place]) 10/23/2008   HIV disease ([place]) 05/09/2006   Depression 05/09/2006   Allergic rhinitis 05/09/2006   GERD 05/09/2006    REFERRING DIAG: M79.602 (ICD-10-CM) - Left arm pain.  Left shoulder arthroscopy with debridement and rotator cuff tear repair.  THERAPY DIAG:  Chronic  left shoulder pain  Muscle weakness (generalized)  Stiffness of left shoulder, not elsewhere classified  Rationale for Evaluation and Treatment Rehabilitation  Onset: 08/31/21   SUBJECTIVE:                                                                                                                                                                                     SUBJECTIVE STATEMENT: My shoulder is improving a little. The voltaren helps with the pain.  Pain location: L shoulder  Pain description: ache , throb, sharp Aggravating factors: L shoulder movement Relieving factors: Ice man, rest, medications Pain range 7/10   PRECAUTIONS: Other: per Dr. Randel Pigg visit note, pt may DC sling with start of PT. Pt is not to completing ligting of the L arm  From: Donella Stade, PA-C  Should be good for full active range of motion and early rotator cuff strengthening exercises starting on 10/13/2021 which will be about 6 weeks out from surgery.     PERTINENT HISTORY: left shoulder arthroscopy debridement rotator cuff tear repair 08/31/21, arthritis, anxiety, CHF, DM   WEIGHT BEARING RESTRICTIONS No   FALLS:  Has patient fallen in last 6 months? No   LIVING ENVIRONMENT: Lives with: lives with their family and lives alone Lives in: House/apartment Pt is able to access and be mobile with in her home   OCCUPATION: Disabled   PLOF: Independent   PATIENT GOALS Use it with less pain, better use with cooking and cleaning   OBJECTIVE: (objective measures completed at initial evaluation unless otherwise dated)   DIAGNOSTIC FINDINGS:  L shoulder MRI 05/25/21 IMPRESSION: 1. Chronic calcific tendinosis of the posterior supraspinatus with moderate intermediate T2 signal and thickening tendinosis of the mid to posterior supraspinatus and anterior infraspinatus. 2. Additional likely chronic calcific tendinosis of the superior subscapularis tendon along with a moderate partial-thickness  tear. 3. Status post distal clavicle excision and acromioplasty. 4. Mild subacromial/subdeltoid bursitis. 5. Mild-to-moderate glenohumeral  cartilage degenerative changes   PATIENT SURVEYS:  Quick Dash 53/55. 10/20/21=43/55   COGNITION:           Overall cognitive status: Within functional limits for tasks assessed                                  SENSATION: WFL   POSTURE: Forward head c CT step off and dorsal hump, increased kyphosis, rounded shoulders,    UPPER EXTREMITY ROM:    Passive ROM Right 09/21/2021 Left 09/21/2021 LT 10/01/21 LT 10/11/21  Shoulder flexion 160 105 d, limited by pain 125, limited by pain, empty endfeel 140, limited by pain c empty end feel  Shoulder extension   115, limited by pain    Shoulder abduction 165   115, limited by pain, empty end feel 130, limited by pain c empty end feel  Shoulder adduction        Shoulder internal rotation T7      Shoulder external rotation T6  45d Limited by pain 45d, not to exceed yet   Elbow flexion        Elbow extension        Wrist flexion        Wrist extension        Wrist ulnar deviation        Wrist radial deviation        Wrist pronation        Wrist supination        Empty end feel for PROM for L shoulder flexion, abd, and ER (Blank rows = not tested)   UPPER EXTREMITY MMT:                       L not tested due to protocol   PALPATION:  TTP of the upper trap and supraspinatus region   OBSERVATION: Steri-strips covering the incision             TODAY'S TREATMENT:  [facility] Adult PT Treatment:                                                DATE: 10/20/21 Therapeutic Exercise: Shoulder pulleys flexion and scaption 2 min each AAROM for flexion, scaption, ER x10 on table top Isometrics for shoulder flex, abd, ext, ER, IR x5 5" each Shoulder row 2x10 YTB Shoulder ext 2x10 YTB Modalities: Cold pack to the L shoulder for 10 mins  [facility] Adult PT Treatment:                                                DATE:  10/14/21 Therapeutic Exercise: UBE- attempted, but pt was not able to generate fast enough pace to turn on the UBE Shoulder pulleys flexion and scaption 2 min each Isometrics for shoulder flex, abd, ext, ER, IR x5 5" each Shoulder row 2x10 YTB Shoulder ext 2x10 YTB HEP updated  [place] Adult PT Treatment:  DATE: 10/08/21 Therapeutic Exercise: Shoulder pulleys flexion and scaption 2 min  Table top AAROM for flexion, abd, and ER x10  Manual Therapy: STM to peri-GH jt and scapular muscles PROM for flex/ext, abd/add, ER/IR    PATIENT EDUCATION: Education details: Eval findings, POC, HEP Person educated: Patient Education method: Explanation, Demonstration, Tactile cues, Verbal cues, and Handouts Education comprehension: verbalized understanding, returned demonstration, verbal cues required, and tactile cues required     HOME EXERCISE PROGRAM: Access Code: [email or web address] URL: [email or web address] Date: 10/14/2021 Prepared by: Gar Ponto  Exercises - Flexion-Extension Shoulder Pendulum with Table Support  - 2 x daily - 7 x weekly - 2 sets - 10 reps - Horizontal Shoulder Pendulum with Table Support  - 2 x daily - 7 x weekly - 2 sets - 10 reps - Seated Shoulder Flexion Slide at Table Top with Forearm in Neutral  - 2 x daily - 7 x weekly - 1 sets - 5 reps - 5 hold - Seated Shoulder Scaption Slide at Table Top with Forearm in Neutral  - 2 x daily - 7 x weekly - 1 sets - 5 reps - 5 hold - Seated Shoulder External Rotation PROM on Table  - 2 x daily - 7 x weekly - 1 sets - 5 reps - 5 hold - Isometric Shoulder Flexion at Wall  - 2 x daily - 7 x weekly - 1 sets - 5 reps - Isometric Shoulder Extension at Wall  - 2 x daily - 7 x weekly - 1 sets - 5 reps - 5 hold - Isometric Shoulder Abduction at Wall  - 2 x daily - 7 x weekly - 1 sets - 5 reps - 5 hold - Standing Isometric Shoulder External Rotation with Doorway  - 2 x daily - 7 x weekly - 1  sets - 5 reps - 5 hold - Standing Isometric Shoulder Internal Rotation at Doorway  - 2 x daily - 7 x weekly - 1 sets - 5 reps - 5 hold - Standing Shoulder Row with Anchored Resistance  - 2 x daily - 7 x weekly - 2 sets - 10 reps - 3 hold - Shoulder extension with resistance - Neutral  - 2 x daily - 7 x weekly - 3 sets - 10 reps - 3 hold   ASSESSMENT:   CLINICAL IMPRESSION: Pt is making good progress with PROM. Pain is improving slowly. Per protocol, strengthening and functional use is limited. Pt is tolerated the initiation of isometric strengthening exs. Re-assessment of pt's UE quick dash indicates improved use of the L UE, although limited as anticipated for time frame after surgery.  A cold pack was applied after the session. Pt tolerated PT without adverse effects.  OBJECTIVE IMPAIRMENTS decreased ROM, decreased strength, impaired UE functional use, postural dysfunction, and pain.    ACTIVITY LIMITATIONS carrying, lifting, sleeping, dressing, and reach over head   PARTICIPATION LIMITATIONS: meal prep, cleaning, laundry, driving, shopping, community activity, and yard work   PERSONAL FACTORS Past/current experiences, Time since onset of injury/illness/exacerbation, and 1 comorbidity: DM  are also affecting patient's functional outcome.    REHAB POTENTIAL: Good   CLINICAL DECISION MAKING: Stable/uncomplicated   EVALUATION COMPLEXITY: Low     GOALS:   SHORT TERM GOALS: Target date: 10/12/2021  (Remove Blue Hyperlink)   Pt will be Ind in an initial HEP Baseline:started on eval Goal status: MET   2.  Increase L shoulder flexion PROM to 125d and abd to 135d to progress to  proper R shoulder function Baseline: flex= 105d and abd= 115d Goal status: MET   LONG TERM GOALS: Target date: 11/19/21   Increase L shoulder strength to 4 or > for improved R shoulder function Baseline: NT Goal status: INITIAL   2.  Increase L shoulder AROM to at least 85% of the R shoulder to complete above  shoulder reaching  Baseline: See flow sheet Goal status: INITIAL   3. Decrease L shoulder pain to 3/10 or less for improved L UE function and QOL  Baseline: 4-10/10 Goal status: INITIAL   4.  Pt will complete L shoulder above shoulder reaching c min to zero scapular shrug Baseline:  Goal status: INITIAL   5.  Pt will score 27 or less with the UE Quickdash as indication of improved function Baseline: 53 Goal status: INITIAL   6.  Pt will be Ind in a final HEP to maintain achieved LOF and QOL Baselinestarted on eval  Goal status: INITIAL     PLAN: PT FREQUENCY: 1w2, 2w6   PT DURATION: 8 weeks   PLANNED INTERVENTIONS: Therapeutic exercises, Therapeutic activity, Neuromuscular re-education, Balance training, Gait training, Patient/Family education, Joint mobilization, Aquatic Therapy, Dry Needling, Electrical stimulation, Cryotherapy, Moist heat, Taping, Vasopneumatic device, Ultrasound, Ionotophoresis 12m/ml Dexamethasone, Manual therapy, and Re-evaluation   PLAN FOR NEXT SESSION: Assess response to HEP. Progress PROM and AAROM as tolerated. Initiate isometric strengthening exs. Re-assess Quick Dash.  Meiya Wisler MS, PT 10/20/21 1:35 PM

## 2021-10-20 NOTE — Patient Outreach (Signed)
Care Coordination  10/20/2021  [name] [name] 05-07-1960 [id number]  RNCM called patient to provide updated [facility] Transportation phone number.  Aida Raider RN, BSN [place]  [employer] - Managed Medicaid High Risk [phone]

## 2021-10-25 ENCOUNTER — Encounter: Payer: Self-pay | Admitting: Physical Therapy

## 2021-10-25 ENCOUNTER — Ambulatory Visit: Payer: Medicaid Other | Attending: Orthopedic Surgery | Admitting: Physical Therapy

## 2021-10-25 DIAGNOSIS — M25612 Stiffness of left shoulder, not elsewhere classified: Secondary | ICD-10-CM | POA: Diagnosis present

## 2021-10-25 DIAGNOSIS — M25512 Pain in left shoulder: Secondary | ICD-10-CM | POA: Insufficient documentation

## 2021-10-25 DIAGNOSIS — M6281 Muscle weakness (generalized): Secondary | ICD-10-CM | POA: Diagnosis present

## 2021-10-25 DIAGNOSIS — G8929 Other chronic pain: Secondary | ICD-10-CM | POA: Diagnosis present

## 2021-10-25 NOTE — Therapy (Signed)
OUTPATIENT PHYSICAL THERAPY TREATMENT NOTE   Patient Name: [name] [name] MRN: [id number] DOB:06/20/1960, 61 y.o., female Today's Date: 10/25/2021  PCP: [name], MD  REFERRING PROVIDER: Meredith Pel, MD  END OF SESSION:   PT End of Session - 10/25/21 1235     Visit Number 7    Number of Visits 15    Date for PT Re-Evaluation 11/18/21    PT Start Time 1232    PT Stop Time 1216    PT Time Calculation (min) 47 min                 Past Medical History:  Diagnosis Date   Allergic rhinitis 05/09/2006   Allergy    Anxiety    Arthritis    Asthma    CHF (congestive heart failure) ([place])    Chronic back pain    Diabetes mellitus without complication (HCC)    GERD (gastroesophageal reflux disease)    Heart murmur    as a child   Hepatitis C    genotype 1b, 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 s/p 12 week course of simeprevir and sofosbuvir between October 2014 and January 2015 with resolution.   History of shingles    HIV infection ([place])    1994   Hyperlipidemia    no meds taken now   Hypertension    Migraine    Pituitary microadenoma ([place]) 08/08/2014   Pneumonia    Prediabetes    Refusal of blood transfusions as patient is Jehovah's Witness    Secondary adrenal insufficiency ([place]) 06/29/2014   Urticaria    Past Surgical History:  Procedure Laterality Date   BUNIONECTOMY     b/l   COLECTOMY     2003 for diverticulitis, had colostomy bag and then reversed   COLONOSCOPY     HAND SURGERY     HAND SURGERY Right    right thumb surgery Dr Burney Gauze 2021   NASAL SINUS SURGERY     SHOULDER ARTHROSCOPY WITH OPEN ROTATOR CUFF REPAIR AND DISTAL CLAVICLE ACROMINECTOMY Left 08/31/2021   Procedure: LEFT SHOULDER ARTHROSCOPY, DEBRIDEMENT,  ROTATOR CUFF TEAR REPAIR;  Surgeon: Meredith Pel, MD;  Location: [facility];  Service: Orthopedics;  Laterality: Left;   SHOULDER SURGERY     left   TONSILLECTOMY     Patient Active Problem List    Diagnosis Date Noted   Synovitis of left shoulder    Complete tear of left rotator cuff    Acute otitis media with perforated tympanic membrane, right 08/03/2021   Impingement syndrome of left shoulder 07/01/2021   Left leg swelling 07/09/2020   Grief reaction 06/08/2020   Arm pain 02/10/2020   Dizziness on standing 12/27/2019   Generalized anxiety disorder with panic attacks 06/20/2019   Osteoarthritis of thumb, right 11/29/2018   Chronic migraine w/o aura w/o status migrainosus, not intractable 08/27/2018   Disorder of left eustachian tube 05/19/2017   Vertigo of central origin 01/23/2017   Central perforation of tympanic membrane of left ear 12/09/2016   Eustachian tube dysfunction, bilateral 06/15/2016   Ear pain, right 06/15/2016   Spondylosis of lumbar region without myelopathy or radiculopathy 10/08/2015   BPPV (benign paroxysmal positional vertigo) 10/02/2015   Liver fibrosis ([place]) 12/04/2014   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   Vitamin D deficiency 06/29/2014   Secondary adrenal insufficiency ([place]) 06/29/2014   History of tympanostomy tube placement 02/07/2014   Refusal of blood transfusions as  patient is Jehovah's Witness 11/18/2013   Hepatitis C virus infection cured after antiviral drug therapy 03/16/2012   Health care maintenance 12/15/2011   Opioid dependence ([place]) 10/05/2010   HTN (hypertension) 10/05/2010   Hyperlipidemia associated with type 2 diabetes mellitus ([place]) 10/23/2008   HIV disease ([place]) 05/09/2006   Depression 05/09/2006   Allergic rhinitis 05/09/2006   GERD 05/09/2006    REFERRING DIAG: M79.602 (ICD-10-CM) - Left arm pain.  Left shoulder arthroscopy with debridement and rotator cuff tear repair.  THERAPY DIAG:  Chronic left shoulder pain  Muscle weakness (generalized)  Stiffness of left shoulder, not elsewhere classified  Rationale for Evaluation and Treatment  Rehabilitation  Onset: 08/31/21   SUBJECTIVE:             SUBJECTIVE STATEMENT:  The shoulder hurts so bad at night. It throbs and aches deep to the bone.                                                                                                                                                                            NPRS: 8/10 Pain location: L shoulder  Pain description: ache , throb, sharp Aggravating factors: L shoulder movement Relieving factors: Ice man, rest, medications Pain range 7-8/10   PRECAUTIONS: Other: per Dr. Randel Pigg visit note, pt may DC sling with start of PT. Pt is not to completing ligting of the L arm  From: Donella Stade, PA-C  Should be good for full active range of motion and early rotator cuff strengthening exercises starting on 10/13/2021 which will be about 6 weeks out from surgery.     PERTINENT HISTORY: left shoulder arthroscopy debridement rotator cuff tear repair 08/31/21, arthritis, anxiety, CHF, DM   WEIGHT BEARING RESTRICTIONS No   FALLS:  Has patient fallen in last 6 months? No   LIVING ENVIRONMENT: Lives with: lives with their family and lives alone Lives in: House/apartment Pt is able to access and be mobile with in her home   OCCUPATION: Disabled   PLOF: Independent   PATIENT GOALS Use it with less pain, better use with cooking and cleaning   OBJECTIVE: (objective measures completed at initial evaluation unless otherwise dated)   DIAGNOSTIC FINDINGS:  L shoulder MRI 05/25/21 IMPRESSION: 1. Chronic calcific tendinosis of the posterior supraspinatus with moderate intermediate T2 signal and thickening tendinosis of the mid to posterior supraspinatus and anterior infraspinatus. 2. Additional likely chronic calcific tendinosis of the superior subscapularis tendon along with a moderate partial-thickness tear. 3. Status post distal clavicle excision and acromioplasty. 4. Mild subacromial/subdeltoid bursitis. 5. Mild-to-moderate  glenohumeral cartilage degenerative changes   PATIENT SURVEYS:  Quick Dash 53/55. 10/20/21=43/55   COGNITION:  Overall cognitive status: Within functional limits for tasks assessed                                  SENSATION: WFL   POSTURE: Forward head c CT step off and dorsal hump, increased kyphosis, rounded shoulders,    UPPER EXTREMITY ROM:    Passive ROM Right 09/21/2021 Left 09/21/2021 LT 10/01/21 LT 10/11/21 LT AROM  10/25/21  Shoulder flexion 160 105 d, limited by pain 125, limited by pain, empty endfeel 140, limited by pain c empty end feel 110  Shoulder extension   115, limited by pain     Shoulder abduction 165   115, limited by pain, empty end feel 130, limited by pain c empty end feel 90  Shoulder adduction         Shoulder internal rotation T7       Shoulder external rotation T6  45d Limited by pain 45d, not to exceed yet    Elbow flexion         Elbow extension         Wrist flexion         Wrist extension         Wrist ulnar deviation         Wrist radial deviation         Wrist pronation         Wrist supination         Empty end feel for PROM for L shoulder flexion, abd, and ER (Blank rows = not tested)   UPPER EXTREMITY MMT:                       L not tested due to protocol   PALPATION:  TTP of the upper trap and supraspinatus region   OBSERVATION: Steri-strips covering the incision             TODAY'S TREATMENT:  [facility] Adult PT Treatment:                                                DATE: 10/25/21 Therapeutic Exercise: Shoulder pulleys flexion and scaption 2 min each Shoulder row 2x10 RTB Shoulder ext 2x10 RTB Yellow band x 10 ER Yellow band x 10 IR Shoulder- finger ladder x 2    Modalities: Vaso x 10 min low compression, left shoulder  [facility] Adult PT Treatment:                                                DATE: 10/20/21 Therapeutic Exercise: Shoulder pulleys flexion and scaption 2 min each AAROM for flexion, scaption, ER x10 on  table top Isometrics for shoulder flex, abd, ext, ER, IR x5 5" each Shoulder row 2x10 YTB Shoulder ext 2x10 YTB Modalities: Cold pack to the L shoulder for 10 mins  [facility] Adult PT Treatment:                                                DATE: 10/14/21  Therapeutic Exercise: UBE- attempted, but pt was not able to generate fast enough pace to turn on the UBE Shoulder pulleys flexion and scaption 2 min each Isometrics for shoulder flex, abd, ext, ER, IR x5 5" each Shoulder row 2x10 YTB Shoulder ext 2x10 YTB HEP updated  [facility] Adult PT Treatment:                                                DATE: 10/08/21 Therapeutic Exercise: Shoulder pulleys flexion and scaption 2 min  Table top AAROM for flexion, abd, and ER x10  Manual Therapy: STM to peri-GH jt and scapular muscles PROM for flex/ext, abd/add, ER/IR    PATIENT EDUCATION: Education details: Eval findings, POC, HEP Person educated: Patient Education method: Explanation, Demonstration, Tactile cues, Verbal cues, and Handouts Education comprehension: verbalized understanding, returned demonstration, verbal cues required, and tactile cues required     HOME EXERCISE PROGRAM: Access Code: [email or web address] URL: [email or web address] Date: 10/14/2021 Prepared by: Gar Ponto  Exercises - Flexion-Extension Shoulder Pendulum with Table Support  - 2 x daily - 7 x weekly - 2 sets - 10 reps - Horizontal Shoulder Pendulum with Table Support  - 2 x daily - 7 x weekly - 2 sets - 10 reps - Seated Shoulder Flexion Slide at Table Top with Forearm in Neutral  - 2 x daily - 7 x weekly - 1 sets - 5 reps - 5 hold - Seated Shoulder Scaption Slide at Table Top with Forearm in Neutral  - 2 x daily - 7 x weekly - 1 sets - 5 reps - 5 hold - Seated Shoulder External Rotation PROM on Table  - 2 x daily - 7 x weekly - 1 sets - 5 reps - 5 hold - Isometric Shoulder Flexion at Wall  - 2 x daily - 7 x weekly - 1 sets - 5 reps - Isometric Shoulder  Extension at Wall  - 2 x daily - 7 x weekly - 1 sets - 5 reps - 5 hold - Isometric Shoulder Abduction at Wall  - 2 x daily - 7 x weekly - 1 sets - 5 reps - 5 hold - Standing Isometric Shoulder External Rotation with Doorway  - 2 x daily - 7 x weekly - 1 sets - 5 reps - 5 hold - Standing Isometric Shoulder Internal Rotation at Doorway  - 2 x daily - 7 x weekly - 1 sets - 5 reps - 5 hold - Standing Shoulder Row with Anchored Resistance  - 2 x daily - 7 x weekly - 2 sets - 10 reps - 3 hold - Shoulder extension with resistance - Neutral  - 2 x daily - 7 x weekly - 3 sets - 10 reps - 3 hold   ASSESSMENT:   CLINICAL IMPRESSION: Pt reports high levels of pain that keep her awake at night. She has been consistent with isometrics and scap strength. She reports warmth in her left shoulder as well as deep throbbing pain.  She had increased pain with most therex today. She liked the finger ladder and was able to complete without increased pain. Vaso used at end of session to calm down pain and edema. She sees MD this week to discuss her progress.     OBJECTIVE IMPAIRMENTS decreased ROM, decreased strength, impaired UE functional use, postural dysfunction, and pain.  ACTIVITY LIMITATIONS carrying, lifting, sleeping, dressing, and reach over head   PARTICIPATION LIMITATIONS: meal prep, cleaning, laundry, driving, shopping, community activity, and yard work   PERSONAL FACTORS Past/current experiences, Time since onset of injury/illness/exacerbation, and 1 comorbidity: DM  are also affecting patient's functional outcome.    REHAB POTENTIAL: Good   CLINICAL DECISION MAKING: Stable/uncomplicated   EVALUATION COMPLEXITY: Low     GOALS:   SHORT TERM GOALS: Target date: 10/12/2021  (Remove Blue Hyperlink)   Pt will be Ind in an initial HEP Baseline:started on eval Goal status: MET   2.  Increase L shoulder flexion PROM to 125d and abd to 135d to progress to proper R shoulder function Baseline:  flex= 105d and abd= 115d Goal status: MET   LONG TERM GOALS: Target date: 11/19/21   Increase L shoulder strength to 4 or > for improved R shoulder function Baseline: NT Goal status: INITIAL   2.  Increase L shoulder AROM to at least 85% of the R shoulder to complete above shoulder reaching  Baseline: See flow sheet Goal status: INITIAL   3. Decrease L shoulder pain to 3/10 or less for improved L UE function and QOL  Baseline: 4-10/10 Goal status: INITIAL   4.  Pt will complete L shoulder above shoulder reaching c min to zero scapular shrug Baseline:  Goal status: INITIAL   5.  Pt will score 27 or less with the UE Quickdash as indication of improved function Baseline: 53 Goal status: INITIAL   6.  Pt will be Ind in a final HEP to maintain achieved LOF and QOL Baselinestarted on eval  Goal status: INITIAL     PLAN: PT FREQUENCY: 1w2, 2w6   PT DURATION: 8 weeks   PLANNED INTERVENTIONS: Therapeutic exercises, Therapeutic activity, Neuromuscular re-education, Balance training, Gait training, Patient/Family education, Joint mobilization, Aquatic Therapy, Dry Needling, Electrical stimulation, Cryotherapy, Moist heat, Taping, Vasopneumatic device, Ultrasound, Ionotophoresis 50m/ml Dexamethasone, Manual therapy, and Re-evaluation   PLAN FOR NEXT SESSION: Assess response to HEP. Progress PROM and AAROM as tolerated. Initiate isometric strengthening exs. Re-assess Quick Dash.How was MD visit , repeat vaso, consider estim  JHessie Diener PTA 10/25/21 1:28 PM Phone: [phone]: [phone]

## 2021-10-27 ENCOUNTER — Ambulatory Visit: Payer: Medicaid Other

## 2021-10-27 ENCOUNTER — Other Ambulatory Visit: Payer: Medicaid Other | Admitting: Obstetrics and Gynecology

## 2021-10-27 DIAGNOSIS — M25612 Stiffness of left shoulder, not elsewhere classified: Secondary | ICD-10-CM

## 2021-10-27 DIAGNOSIS — M25512 Pain in left shoulder: Secondary | ICD-10-CM | POA: Diagnosis not present

## 2021-10-27 DIAGNOSIS — M6281 Muscle weakness (generalized): Secondary | ICD-10-CM

## 2021-10-27 DIAGNOSIS — G8929 Other chronic pain: Secondary | ICD-10-CM

## 2021-10-27 NOTE — Patient Outreach (Signed)
Care Coordination  10/27/2021  [name] [name] [birth year] [id number]  RNCM returned patient's phone call regarding medication refill.  Patient unable to send refill request for Lipitor via MyChart.  Instructed patient to contact her pharmacy, [facility],  regarding medication refill.  Aida Raider RN, BSN [place]  [facility] - Managed Medicaid High Risk [phone].

## 2021-10-27 NOTE — Therapy (Signed)
OUTPATIENT PHYSICAL THERAPY TREATMENT NOTE   Patient Name: [name] [name] MRN: [id number] DOB:[birth year], 61 y.o., female Today's Date: 10/27/2021  PCP: [name], MD  REFERRING PROVIDER: Meredith Pel, MD  END OF SESSION:   PT End of Session - 10/27/21 1232     Visit Number 8    Number of Visits 15    Date for PT Re-Evaluation 11/18/21    Authorization Type [place] MEDICAID [employer]    Progress Note Due on Visit 10    PT Start Time 1152    PT Stop Time 8182    PT Time Calculation (min) 53 min    Activity Tolerance Patient limited by pain;Patient tolerated treatment well    Behavior During Therapy [facility] for tasks assessed/performed                  Past Medical History:  Diagnosis Date   Allergic rhinitis 05/09/2006   Allergy    Anxiety    Arthritis    Asthma    CHF (congestive heart failure) (HCC)    Chronic back pain    Diabetes mellitus without complication (HCC)    GERD (gastroesophageal reflux disease)    Heart murmur    as a child   Hepatitis C    genotype 1b, 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 s/p 12 week course of simeprevir and sofosbuvir between October 2014 and January 2015 with resolution.   History of shingles    HIV infection ([place])    1994   Hyperlipidemia    no meds taken now   Hypertension    Migraine    Pituitary microadenoma ([place]) 08/08/2014   Pneumonia    Prediabetes    Refusal of blood transfusions as patient is Jehovah's Witness    Secondary adrenal insufficiency ([place]) 06/29/2014   Urticaria    Past Surgical History:  Procedure Laterality Date   BUNIONECTOMY     b/l   COLECTOMY     2003 for diverticulitis, had colostomy bag and then reversed   COLONOSCOPY     HAND SURGERY     HAND SURGERY Right    right thumb surgery Dr Burney Gauze 2021   NASAL SINUS SURGERY     SHOULDER ARTHROSCOPY WITH OPEN ROTATOR CUFF REPAIR AND DISTAL CLAVICLE ACROMINECTOMY Left 08/31/2021   Procedure: LEFT SHOULDER  ARTHROSCOPY, DEBRIDEMENT,  ROTATOR CUFF TEAR REPAIR;  Surgeon: Meredith Pel, MD;  Location: [place] [facility];  Service: Orthopedics;  Laterality: Left;   SHOULDER SURGERY     left   TONSILLECTOMY     Patient Active Problem List   Diagnosis Date Noted   Synovitis of left shoulder    Complete tear of left rotator cuff    Acute otitis media with perforated tympanic membrane, right 08/03/2021   Impingement syndrome of left shoulder 07/01/2021   Left leg swelling 07/09/2020   Grief reaction 06/08/2020   Arm pain 02/10/2020   Dizziness on standing 12/27/2019   Generalized anxiety disorder with panic attacks 06/20/2019   Osteoarthritis of thumb, right 11/29/2018   Chronic migraine w/o aura w/o status migrainosus, not intractable 08/27/2018   Disorder of left eustachian tube 05/19/2017   Vertigo of central origin 01/23/2017   Central perforation of tympanic membrane of left ear 12/09/2016   Eustachian tube dysfunction, bilateral 06/15/2016   Ear pain, right 06/15/2016   Spondylosis of lumbar region without myelopathy or radiculopathy 10/08/2015   BPPV (benign paroxysmal positional vertigo) 10/02/2015   Liver fibrosis ([place]) 12/04/2014  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/08/2014   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   Vitamin D deficiency 06/29/2014   Secondary adrenal insufficiency ([place]) 06/29/2014   History of tympanostomy tube placement 02/07/2014   Refusal of blood transfusions as patient is Jehovah's Witness 11/18/2013   Hepatitis C virus infection cured after antiviral drug therapy 03/16/2012   Health care maintenance 12/15/2011   Opioid dependence ([place]) 10/05/2010   HTN (hypertension) 10/05/2010   Hyperlipidemia associated with type 2 diabetes mellitus ([place]) 10/23/2008   HIV disease ([place]) 05/09/2006   Depression 05/09/2006   Allergic rhinitis 05/09/2006   GERD 05/09/2006    REFERRING DIAG: M79.602 (ICD-10-CM) - Left arm pain.  Left shoulder arthroscopy with  debridement and rotator cuff tear repair.  THERAPY DIAG:  Chronic left shoulder pain  Muscle weakness (generalized)  Stiffness of left shoulder, not elsewhere classified  Rationale for Evaluation and Treatment Rehabilitation  Onset: 08/31/21   SUBJECTIVE:         Subjective statement:  Pt reports continued L shoulder pain especially at night. She notes the L shoulder feels more warm.                                                                                                                                                                  NPRS: 7/10 Pain location: L shoulder  Pain description: ache , throb, sharp Aggravating factors: L shoulder movement Relieving factors: Ice man, rest, medications Pain range 7-8/10   PRECAUTIONS: Other: per Dr. Randel Pigg visit note, pt may DC sling with start of PT. Pt is not to completing ligting of the L arm  From: Donella Stade, PA-C  Should be good for full active range of motion and early rotator cuff strengthening exercises starting on 10/13/2021 which will be about 6 weeks out from surgery.     PERTINENT HISTORY: left shoulder arthroscopy debridement rotator cuff tear repair 08/31/21, arthritis, anxiety, CHF, DM   WEIGHT BEARING RESTRICTIONS No   FALLS:  Has patient fallen in last 6 months? No   LIVING ENVIRONMENT: Lives with: lives with their family and lives alone Lives in: House/apartment Pt is able to access and be mobile with in her home   OCCUPATION: Disabled   PLOF: Independent   PATIENT GOALS Use it with less pain, better use with cooking and cleaning   OBJECTIVE: (objective measures completed at initial evaluation unless otherwise dated)   DIAGNOSTIC FINDINGS:  L shoulder MRI 05/25/21 IMPRESSION: 1. Chronic calcific tendinosis of the posterior supraspinatus with moderate intermediate T2 signal and thickening tendinosis of the mid to posterior supraspinatus and anterior infraspinatus. 2. Additional likely  chronic calcific tendinosis of the superior subscapularis tendon along with a moderate partial-thickness tear. 3. Status post distal clavicle excision and acromioplasty. 4. Mild subacromial/subdeltoid  bursitis. 5. Mild-to-moderate glenohumeral cartilage degenerative changes   PATIENT SURVEYS:  Quick Dash 53/55. 10/20/21=43/55   COGNITION:           Overall cognitive status: Within functional limits for tasks assessed                                  SENSATION: WFL   POSTURE: Forward head c CT step off and dorsal hump, increased kyphosis, rounded shoulders,    UPPER EXTREMITY ROM:    Passive ROM Right 09/21/2021 Left 09/21/2021 LT 10/01/21 LT 10/11/21 LT AROM  10/25/21  Shoulder flexion 160 105 d, limited by pain 125, limited by pain, empty endfeel 140, limited by pain c empty end feel 110  Shoulder extension   115, limited by pain     Shoulder abduction 165   115, limited by pain, empty end feel 130, limited by pain c empty end feel 90  Shoulder adduction         Shoulder internal rotation T7       Shoulder external rotation T6  45d Limited by pain 45d, not to exceed yet    Elbow flexion         Elbow extension         Wrist flexion         Wrist extension         Wrist ulnar deviation         Wrist radial deviation         Wrist pronation         Wrist supination         Empty end feel for PROM for L shoulder flexion, abd, and ER (Blank rows = not tested)   UPPER EXTREMITY MMT:                       L not tested due to protocol   PALPATION:  TTP of the upper trap and supraspinatus region   OBSERVATION: Steri-strips covering the incision             TODAY'S TREATMENT:  [facility] Adult PT Treatment:                                                DATE: 10/27/21 Therapeutic Exercise: Shoulder pulleys flexion and scaption 2 min each SL L shoulder ER 2x10, no wt. Supine small shoulder motion, clock pattern 2x 1 min Shoulder row 2x10 RTB Shoulder ext 2x10 RTB Shoulder- finger  ladder x 2  AAROM wall slides x5 Isometrics for shoulder flex, abd, ext, ER, IR x5 5" each Modalities: Vaso x 10 min med compression, 3dF,, left shoulder  [facility] Adult PT Treatment:                                                DATE: 10/25/21 Therapeutic Exercise: Shoulder pulleys flexion and scaption 2 min each Shoulder row 2x10 RTB Shoulder ext 2x10 RTB Yellow band x 10 ER Yellow band x 10 IR Shoulder- finger ladder x 2    Modalities: Vaso x 10 min low compression, left shoulder  [facility] Adult PT Treatment:  DATE: 10/20/21 Therapeutic Exercise: Shoulder pulleys flexion and scaption 2 min each AAROM for flexion, scaption, ER x10 on table top Isometrics for shoulder flex, abd, ext, ER, IR x5 5" each Shoulder row 2x10 YTB Shoulder ext 2x10 YTB Modalities: Cold pack to the L shoulder for 10 mins  [facility] Adult PT Treatment:                                                DATE: 10/14/21 Therapeutic Exercise: UBE- attempted, but pt was not able to generate fast enough pace to turn on the UBE Shoulder pulleys flexion and scaption 2 min each Isometrics for shoulder flex, abd, ext, ER, IR x5 5" each Shoulder row 2x10 YTB Shoulder ext 2x10 YTB HEP updated    PATIENT EDUCATION: Education details: Eval findings, POC, HEP Person educated: Patient Education method: Explanation, Demonstration, Tactile cues, Verbal cues, and Handouts Education comprehension: verbalized understanding, returned demonstration, verbal cues required, and tactile cues required     HOME EXERCISE PROGRAM: Access Code: [email or web address] URL: [email or web address] Date: 10/14/2021 Prepared by: Gar Ponto  Exercises - Flexion-Extension Shoulder Pendulum with Table Support  - 2 x daily - 7 x weekly - 2 sets - 10 reps - Horizontal Shoulder Pendulum with Table Support  - 2 x daily - 7 x weekly - 2 sets - 10 reps - Seated Shoulder Flexion Slide at Table Top with  Forearm in Neutral  - 2 x daily - 7 x weekly - 1 sets - 5 reps - 5 hold - Seated Shoulder Scaption Slide at Table Top with Forearm in Neutral  - 2 x daily - 7 x weekly - 1 sets - 5 reps - 5 hold - Seated Shoulder External Rotation PROM on Table  - 2 x daily - 7 x weekly - 1 sets - 5 reps - 5 hold - Isometric Shoulder Flexion at Wall  - 2 x daily - 7 x weekly - 1 sets - 5 reps - Isometric Shoulder Extension at Wall  - 2 x daily - 7 x weekly - 1 sets - 5 reps - 5 hold - Isometric Shoulder Abduction at Wall  - 2 x daily - 7 x weekly - 1 sets - 5 reps - 5 hold - Standing Isometric Shoulder External Rotation with Doorway  - 2 x daily - 7 x weekly - 1 sets - 5 reps - 5 hold - Standing Isometric Shoulder Internal Rotation at Doorway  - 2 x daily - 7 x weekly - 1 sets - 5 reps - 5 hold - Standing Shoulder Row with Anchored Resistance  - 2 x daily - 7 x weekly - 2 sets - 10 reps - 3 hold - Shoulder extension with resistance - Neutral  - 2 x daily - 7 x weekly - 3 sets - 10 reps - 3 hold   ASSESSMENT:   CLINICAL IMPRESSION: L shoulder feels mildly warmer than the R. AROM for L shoulder flexion continues to be in the 110d range. PT was completed for ROM and light strengthening of the rotator cuff and periscapular muscles. Vaso was provided at the end of the session for symptom management. Pt reported no overall increase in L shoulder pain with today's treatment. While L shoulder pain remains at a high level. PROM and AROM is progressing appropriately.  OBJECTIVE IMPAIRMENTS decreased ROM, decreased strength, impaired  UE functional use, postural dysfunction, and pain.    ACTIVITY LIMITATIONS carrying, lifting, sleeping, dressing, and reach over head   PARTICIPATION LIMITATIONS: meal prep, cleaning, laundry, driving, shopping, community activity, and yard work   PERSONAL FACTORS Past/current experiences, Time since onset of injury/illness/exacerbation, and 1 comorbidity: DM  are also affecting patient's  functional outcome.    REHAB POTENTIAL: Good   CLINICAL DECISION MAKING: Stable/uncomplicated   EVALUATION COMPLEXITY: Low     GOALS:   SHORT TERM GOALS: Target date: 10/12/2021  (Remove Blue Hyperlink)   Pt will be Ind in an initial HEP Baseline:started on eval Goal status: MET   2.  Increase L shoulder flexion PROM to 125d and abd to 135d to progress to proper R shoulder function Baseline: flex= 105d and abd= 115d Goal status: MET   LONG TERM GOALS: Target date: 11/19/21   Increase L shoulder strength to 4 or > for improved R shoulder function Baseline: NT Goal status: INITIAL   2.  Increase L shoulder AROM to at least 85% of the R shoulder to complete above shoulder reaching  Baseline: See flow sheet Goal status: INITIAL   3. Decrease L shoulder pain to 3/10 or less for improved L UE function and QOL  Baseline: 4-10/10 Goal status: INITIAL   4.  Pt will complete L shoulder above shoulder reaching c min to zero scapular shrug Baseline:  Goal status: INITIAL   5.  Pt will score 27 or less with the UE Quickdash as indication of improved function Baseline: 53 Goal status: INITIAL   6.  Pt will be Ind in a final HEP to maintain achieved LOF and QOL Baselinestarted on eval  Goal status: INITIAL     PLAN: PT FREQUENCY: 1w2, 2w6   PT DURATION: 8 weeks   PLANNED INTERVENTIONS: Therapeutic exercises, Therapeutic activity, Neuromuscular re-education, Balance training, Gait training, Patient/Family education, Joint mobilization, Aquatic Therapy, Dry Needling, Electrical stimulation, Cryotherapy, Moist heat, Taping, Vasopneumatic device, Ultrasound, Ionotophoresis 48m/ml Dexamethasone, Manual therapy, and Re-evaluation   PLAN FOR NEXT SESSION: Assess response to HEP. Progress PROM and AAROM as tolerated. Initiate isometric strengthening exs.How was MD visit , repeat vaso, consider estim  Cadyn Fann MS, PT 10/27/21 1:11 PM

## 2021-10-28 ENCOUNTER — Encounter: Payer: Medicaid Other | Admitting: Orthopedic Surgery

## 2021-10-28 ENCOUNTER — Other Ambulatory Visit: Payer: Self-pay | Admitting: Obstetrics and Gynecology

## 2021-10-28 NOTE — Patient Outreach (Signed)
Care Coordination  10/28/2021  [name] [name] 1960-07-22 [id number]  RNCM received phone call from patient regarding [facility] transportation.  Patient states she was scheduled  to be picked up  today at 1000 for a 1045 post op appointment with Dr.Dean.  Patient states she called [employer] transportation this morning to confirm appointment and they did not arrive at 10.  Patient states this is the third time this has happened to her.  RNCM notified Managed Medicaid Supervisor and [name], [facility] Liaison, regarding patient's experience.  RNCM notified patient of follow up.  Aida Raider RN, BSN [name]  [employer] - Managed Medicaid High Risk [phone].

## 2021-10-29 ENCOUNTER — Other Ambulatory Visit: Payer: Self-pay | Admitting: Obstetrics and Gynecology

## 2021-10-29 ENCOUNTER — Telehealth: Payer: Self-pay

## 2021-10-29 NOTE — Patient Outreach (Signed)
Care Coordination  10/29/2021  [name] [name] [birth year] [id number]  RNCM called patient after receiving return call from [name], [facility] liaison.  Patient given information to file a formal complaint with [facility] regarding transportation issues, call member services at [phone] per Mickel Baas.  Aida Raider RN, BSN [place]  [employer] - Managed Medicaid High Risk [phone]

## 2021-10-29 NOTE — Telephone Encounter (Signed)
Patient is calling stating she is out of her sling from her shoulder surgery. She wants to know if she can get her injection

## 2021-11-02 ENCOUNTER — Ambulatory Visit: Payer: Medicaid Other

## 2021-11-02 DIAGNOSIS — M25612 Stiffness of left shoulder, not elsewhere classified: Secondary | ICD-10-CM

## 2021-11-02 DIAGNOSIS — M25512 Pain in left shoulder: Secondary | ICD-10-CM | POA: Diagnosis not present

## 2021-11-02 DIAGNOSIS — M6281 Muscle weakness (generalized): Secondary | ICD-10-CM

## 2021-11-02 DIAGNOSIS — G8929 Other chronic pain: Secondary | ICD-10-CM

## 2021-11-02 NOTE — Therapy (Signed)
OUTPATIENT PHYSICAL THERAPY TREATMENT NOTE   Patient Name: [name] [name] MRN: [id number] DOB:December 26, 1960, 61 y.o., female Today's Date: 11/02/2021  PCP: [name], MD  REFERRING PROVIDER: Meredith Pel, MD  END OF SESSION:   PT End of Session - 11/02/21 1115     Visit Number 9    Number of Visits 15    Date for PT Re-Evaluation 11/18/21    Authorization Type [place] MEDICAID [employer]    Progress Note Due on Visit 10    PT Start Time 1106    PT Stop Time 1200    PT Time Calculation (min) 54 min    Activity Tolerance Patient limited by pain;Patient tolerated treatment well    Behavior During Therapy [facility] for tasks assessed/performed                   Past Medical History:  Diagnosis Date   Allergic rhinitis 05/09/2006   Allergy    Anxiety    Arthritis    Asthma    CHF (congestive heart failure) (HCC)    Chronic back pain    Diabetes mellitus without complication (HCC)    GERD (gastroesophageal reflux disease)    Heart murmur    as a child   Hepatitis C    genotype 1b, 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 s/p 12 week course of simeprevir and sofosbuvir between October 2014 and January 2015 with resolution.   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   Pneumonia    Prediabetes    Refusal of blood transfusions as patient is Jehovah's Witness    Secondary adrenal insufficiency ([place]) 06/29/2014   Urticaria    Past Surgical History:  Procedure Laterality Date   BUNIONECTOMY     b/l   COLECTOMY     2003 for diverticulitis, had colostomy bag and then reversed   COLONOSCOPY     HAND SURGERY     HAND SURGERY Right    right thumb surgery Dr Burney Gauze 2021   NASAL SINUS SURGERY     SHOULDER ARTHROSCOPY WITH OPEN ROTATOR CUFF REPAIR AND DISTAL CLAVICLE ACROMINECTOMY Left 08/31/2021   Procedure: LEFT  SHOULDER ARTHROSCOPY, DEBRIDEMENT,  ROTATOR CUFF TEAR REPAIR;  Surgeon: Meredith Pel, MD;  Location: [place];  Service: Orthopedics;  Laterality: Left;   SHOULDER SURGERY     left   TONSILLECTOMY     Patient Active Problem List   Diagnosis Date Noted   Synovitis of left shoulder    Complete tear of left rotator cuff    Acute otitis media with perforated tympanic membrane, right 08/03/2021   Impingement syndrome of left shoulder 07/01/2021   Left leg swelling 07/09/2020   Grief reaction 06/08/2020   Arm pain 02/10/2020   Dizziness on standing 12/27/2019   Generalized anxiety disorder with panic attacks 06/20/2019   Osteoarthritis of thumb, right 11/29/2018   Chronic migraine w/o aura w/o status migrainosus, not intractable 08/27/2018   Disorder of left eustachian tube 05/19/2017   Vertigo of central origin 01/23/2017   Central perforation of tympanic membrane of left ear 12/09/2016   Eustachian tube dysfunction, bilateral 06/15/2016   Ear pain, right 06/15/2016   Spondylosis of lumbar region without myelopathy or radiculopathy 10/08/2015   BPPV (benign paroxysmal positional vertigo) 10/02/2015   Liver fibrosis ([place])  12/04/2014   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   Vitamin D deficiency 06/29/2014   Secondary adrenal insufficiency ([place]) 06/29/2014   History of tympanostomy tube placement 02/07/2014   Refusal of blood transfusions as patient is Jehovah's Witness 11/18/2013   Hepatitis C virus infection cured after antiviral drug therapy 03/16/2012   Health care maintenance 12/15/2011   Opioid dependence ([place]) 10/05/2010   HTN (hypertension) 10/05/2010   Hyperlipidemia associated with type 2 diabetes mellitus ([place]) 10/23/2008   HIV disease ([place]) 05/09/2006   Depression 05/09/2006   Allergic rhinitis 05/09/2006   GERD 05/09/2006    REFERRING DIAG: M79.602 (ICD-10-CM) - Left arm pain.  Left shoulder arthroscopy with  debridement and rotator cuff tear repair.  THERAPY DIAG:  Chronic left shoulder pain  Muscle weakness (generalized)  Stiffness of left shoulder, not elsewhere classified  Rationale for Evaluation and Treatment Rehabilitation  Onset: 08/31/21   SUBJECTIVE:         Subjective statement:   Pt reports her l shoulder is improving, although L shoulder pain is still high, throbbing/aching in nature. Pt notes dressing has become more easy to complete.                                                                                                                                                                  NPRS: 7/10 Pain location: L shoulder  Pain description: ache , throb, sharp Aggravating factors: L shoulder movement Relieving factors: Ice man, rest, medications Pain range 7-8/10   PRECAUTIONS: Other: per Dr. Randel Pigg visit note, pt may DC sling with start of PT. Pt is not to completing ligting of the L arm  From: Donella Stade, PA-C  Should be good for full active range of motion and early rotator cuff strengthening exercises starting on 10/13/2021 which will be about 6 weeks out from surgery.     PERTINENT HISTORY: left shoulder arthroscopy debridement rotator cuff tear repair 08/31/21, arthritis, anxiety, CHF, DM   WEIGHT BEARING RESTRICTIONS No   FALLS:  Has patient fallen in last 6 months? No   LIVING ENVIRONMENT: Lives with: lives with their family and lives alone Lives in: House/apartment Pt is able to access and be mobile with in her home   OCCUPATION: Disabled   PLOF: Independent   PATIENT GOALS Use it with less pain, better use with cooking and cleaning   OBJECTIVE: (objective measures completed at initial evaluation unless otherwise dated)   DIAGNOSTIC FINDINGS:  L shoulder MRI 05/25/21 IMPRESSION: 1. Chronic calcific tendinosis of the posterior supraspinatus with moderate intermediate T2 signal and thickening tendinosis of the mid to posterior  supraspinatus and anterior infraspinatus. 2. Additional likely chronic calcific tendinosis of the superior subscapularis tendon along with a moderate  partial-thickness tear. 3. Status post distal clavicle excision and acromioplasty. 4. Mild subacromial/subdeltoid bursitis. 5. Mild-to-moderate glenohumeral cartilage degenerative changes   PATIENT SURVEYS:  Quick Dash 53/55. 10/20/21=43/55   COGNITION:           Overall cognitive status: Within functional limits for tasks assessed                                  SENSATION: WFL   POSTURE: Forward head c CT step off and dorsal hump, increased kyphosis, rounded shoulders,    UPPER EXTREMITY ROM:    Passive ROM Right 09/21/2021 Left 09/21/2021 LT 10/01/21 LT 10/11/21 LT AROM  10/25/21 LT AROM 11/02/21  Shoulder flexion 160 105 d, limited by pain 125, limited by pain, empty endfeel 140, limited by pain c empty end feel 110 120  Shoulder extension   115, limited by pain      Shoulder abduction 165   115, limited by pain, empty end feel 130, limited by pain c empty end feel 90 90  Shoulder adduction          Shoulder internal rotation T7        Shoulder external rotation T6  45d Limited by pain 45d, not to exceed yet     Elbow flexion          Elbow extension          Wrist flexion          Wrist extension          Wrist ulnar deviation          Wrist radial deviation          Wrist pronation          Wrist supination          Empty end feel for PROM for L shoulder flexion, abd, and ER (Blank rows = not tested)   UPPER EXTREMITY MMT:                       L not tested due to protocol   PALPATION:  TTP of the upper trap and supraspinatus region   OBSERVATION: Steri-strips covering the incision             TODAY'S TREATMENT:  [facility] Adult PT Treatment:                                                DATE: 11/02/21 Therapeutic Exercise: Shoulder pulleys flexion and scaption 2 min each SL L shoulder ER 2x10, no wt. Supine small  shoulder motion, clock pattern 2x 1 min Shoulder row 2x15 RTB Shoulder ext 2x15 RTB AAROM wall slides for flex and abd x5 Isometrics for shoulder flex, abd, ext, ER, IR x5 5" each Modalities: Vaso x 15 mins minimal compression, 36dF, left shoulder  [facility] Adult PT Treatment:                                                DATE: 10/27/21 Therapeutic Exercise: Shoulder pulleys flexion and scaption 2 min each SL L shoulder ER 2x10, no wt. Supine small shoulder motion, clock pattern 2x 1  min Shoulder row 2x10 RTB Shoulder ext 2x10 RTB Shoulder- finger ladder x 2  AAROM wall slides x5 Isometrics for shoulder flex, abd, ext, ER, IR x5 5" each Modalities: Vaso x 10 min med compression, 36dF, left shoulder  [facility] Adult PT Treatment:                                                DATE: 10/25/21 Therapeutic Exercise: Shoulder pulleys flexion and scaption 2 min each Shoulder row 2x10 RTB Shoulder ext 2x10 RTB Yellow band x 10 ER Yellow band x 10 IR Shoulder- finger ladder x 2    Modalities: Vaso x 10 min low compression, left shoulder    PATIENT EDUCATION: Education details: Eval findings, POC, HEP Person educated: Patient Education method: Explanation, Demonstration, Tactile cues, Verbal cues, and Handouts Education comprehension: verbalized understanding, returned demonstration, verbal cues required, and tactile cues required     HOME EXERCISE PROGRAM: Access Code: [email or web address] URL: [email or web address] Date: 10/14/2021 Prepared by: Gar Ponto  Exercises - Flexion-Extension Shoulder Pendulum with Table Support  - 2 x daily - 7 x weekly - 2 sets - 10 reps - Horizontal Shoulder Pendulum with Table Support  - 2 x daily - 7 x weekly - 2 sets - 10 reps - Seated Shoulder Flexion Slide at Table Top with Forearm in Neutral  - 2 x daily - 7 x weekly - 1 sets - 5 reps - 5 hold - Seated Shoulder Scaption Slide at Table Top with Forearm in Neutral  - 2 x daily - 7 x weekly - 1 sets  - 5 reps - 5 hold - Seated Shoulder External Rotation PROM on Table  - 2 x daily - 7 x weekly - 1 sets - 5 reps - 5 hold - Isometric Shoulder Flexion at Wall  - 2 x daily - 7 x weekly - 1 sets - 5 reps - Isometric Shoulder Extension at Wall  - 2 x daily - 7 x weekly - 1 sets - 5 reps - 5 hold - Isometric Shoulder Abduction at Wall  - 2 x daily - 7 x weekly - 1 sets - 5 reps - 5 hold - Standing Isometric Shoulder External Rotation with Doorway  - 2 x daily - 7 x weekly - 1 sets - 5 reps - 5 hold - Standing Isometric Shoulder Internal Rotation at Doorway  - 2 x daily - 7 x weekly - 1 sets - 5 reps - 5 hold - Standing Shoulder Row with Anchored Resistance  - 2 x daily - 7 x weekly - 2 sets - 10 reps - 3 hold - Shoulder extension with resistance - Neutral  - 2 x daily - 7 x weekly - 3 sets - 10 reps - 3 hold   ASSESSMENT:   CLINICAL IMPRESSION: PT was continued for ROM and for low demand strengthening therex. 1x movements to assess AROM for flexion and abd found flex to be improved by 10d, while abd reamains at 90d. With PT today, pt experienced an anticipated increase in L shoulder pain. Vaso was completed at the end of the session to address pain and swelling. Pt tolerated the session without adverse effects.  OBJECTIVE IMPAIRMENTS decreased ROM, decreased strength, impaired UE functional use, postural dysfunction, and pain.    ACTIVITY LIMITATIONS carrying, lifting, sleeping, dressing, and reach over head  PARTICIPATION LIMITATIONS: meal prep, cleaning, laundry, driving, shopping, community activity, and yard work   PERSONAL FACTORS Past/current experiences, Time since onset of injury/illness/exacerbation, and 1 comorbidity: DM  are also affecting patient's functional outcome.    REHAB POTENTIAL: Good   CLINICAL DECISION MAKING: Stable/uncomplicated   EVALUATION COMPLEXITY: Low     GOALS:   SHORT TERM GOALS: Target date: 10/12/2021  (Remove Blue Hyperlink)   Pt will be Ind in an  initial HEP Baseline:started on eval Goal status: MET   2.  Increase L shoulder flexion PROM to 125d and abd to 135d to progress to proper R shoulder function Baseline: flex= 105d and abd= 115d Goal status: MET   LONG TERM GOALS: Target date: 11/19/21   Increase L shoulder strength to 4 or > for improved R shoulder function Baseline: NT Status: grossly 3/5  Goal status: Improving   2.  Increase L shoulder AROM to at least 85% of the R shoulder to complete above shoulder reaching  Baseline: See flow sheet Status; Flexion 120d, abd 90d Goal status: Improving   3. Decrease L shoulder pain to 3/10 or less for improved L UE function and QOL  Baseline: 4-10/10 Status 7/10 Goal status: No improvement   4.  Pt will complete L shoulder above shoulder reaching c min to zero scapular shrug Baseline:  Goal status: INITIAL   5.  Pt will score 27 or less with the UE Quickdash as indication of improved function Baseline: 53 Goal status: INITIAL   6.  Pt will be Ind in a final HEP to maintain achieved LOF and QOL Baselinestarted on eval  Goal status: INITIAL     PLAN: PT FREQUENCY: 1w2, 2w6   PT DURATION: 8 weeks   PLANNED INTERVENTIONS: Therapeutic exercises, Therapeutic activity, Neuromuscular re-education, Balance training, Gait training, Patient/Family education, Joint mobilization, Aquatic Therapy, Dry Needling, Electrical stimulation, Cryotherapy, Moist heat, Taping, Vasopneumatic device, Ultrasound, Ionotophoresis 68m/ml Dexamethasone, Manual therapy, and Re-evaluation   PLAN FOR NEXT SESSION: Assess response to HEP. Progress PROM and AAROM as tolerated. Initiate isometric strengthening exs.How was MD visit , repeat vaso, consider estim. Reassess Quick DDaine GipRalls MS, PT 11/02/21 12:45 PM

## 2021-11-03 ENCOUNTER — Encounter: Payer: Self-pay | Admitting: Orthopedic Surgery

## 2021-11-03 ENCOUNTER — Ambulatory Visit (INDEPENDENT_AMBULATORY_CARE_PROVIDER_SITE_OTHER): Payer: Medicaid Other | Admitting: Orthopedic Surgery

## 2021-11-03 DIAGNOSIS — Z9889 Other specified postprocedural states: Secondary | ICD-10-CM

## 2021-11-03 MED ORDER — GABAPENTIN 300 MG PO CAPS: 300.0000 mg | ORAL_CAPSULE | Freq: Two times a day (BID) | ORAL | 0 refills | Status: DC

## 2021-11-03 NOTE — Therapy (Signed)
OUTPATIENT PHYSICAL THERAPY TREATMENT NOTE/Progress Note   Patient Name: [name] [name] MRN: [id number] DOB:1960-08-07, 61 y.o., female Today's Date: 11/04/2021  PCP: [name], MD  REFERRING PROVIDER: Meredith Pel, MD  Progress Note Reporting Period 09/21/21 to 7/13/223  See note below for Objective Data and Assessment of Progress/Goals.      END OF SESSION:   PT End of Session - 11/04/21 1127     Visit Number 10    Number of Visits 15    Date for PT Re-Evaluation 11/18/21    Authorization Type [place] MEDICAID [employer]    Progress Note Due on Visit 10    PT Start Time 1104    PT Stop Time 1159    PT Time Calculation (min) 55 min    Activity Tolerance Patient limited by pain;Patient tolerated treatment well    Behavior During Therapy [facility] for tasks assessed/performed                    Past Medical History:  Diagnosis Date   Allergic rhinitis 05/09/2006   Allergy    Anxiety    Arthritis    Asthma    CHF (congestive heart failure) (HCC)    Chronic back pain    Diabetes mellitus without complication (HCC)    GERD (gastroesophageal reflux disease)    Heart murmur    as a child   Hepatitis C    genotype 1b, 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 s/p 12 week course of simeprevir and sofosbuvir between October 2014 and January 2015 with resolution.   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   Pneumonia    Prediabetes    Refusal of blood transfusions as patient is Jehovah's Witness    Secondary adrenal insufficiency ([place]) 06/29/2014   Urticaria    Past Surgical History:  Procedure Laterality Date   BUNIONECTOMY     b/l   COLECTOMY     2003 for diverticulitis, had colostomy bag and then reversed   COLONOSCOPY     HAND SURGERY     HAND SURGERY Right    right thumb surgery Dr Burney Gauze 2021    NASAL SINUS SURGERY     SHOULDER ARTHROSCOPY WITH OPEN ROTATOR CUFF REPAIR AND DISTAL CLAVICLE ACROMINECTOMY Left 08/31/2021   Procedure: LEFT SHOULDER ARTHROSCOPY, DEBRIDEMENT,  ROTATOR CUFF TEAR REPAIR;  Surgeon: Meredith Pel, MD;  Location: [place];  Service: Orthopedics;  Laterality: Left;   SHOULDER SURGERY     left   TONSILLECTOMY     Patient Active Problem List   Diagnosis Date Noted   Synovitis of left shoulder    Complete tear of left rotator cuff    Acute otitis media with perforated tympanic membrane, right 08/03/2021   Impingement syndrome of left shoulder 07/01/2021   Left leg swelling 07/09/2020   Grief reaction 06/08/2020   Arm pain 02/10/2020   Dizziness on standing 12/27/2019   Generalized anxiety disorder with panic attacks 06/20/2019   Osteoarthritis of thumb, right 11/29/2018   Chronic migraine w/o aura w/o status migrainosus, not intractable 08/27/2018   Disorder of left eustachian tube 05/19/2017   Vertigo of central origin 01/23/2017   Central perforation of tympanic membrane of left ear 12/09/2016   Eustachian tube dysfunction, bilateral 06/15/2016   Ear pain, right  06/15/2016   Spondylosis of lumbar region without myelopathy or radiculopathy 10/08/2015   BPPV (benign paroxysmal positional vertigo) 10/02/2015   Liver fibrosis ([place]) 12/04/2014   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   Vitamin D deficiency 06/29/2014   Secondary adrenal insufficiency ([place]) 06/29/2014   History of tympanostomy tube placement 02/07/2014   Refusal of blood transfusions as patient is Jehovah's Witness 11/18/2013   Hepatitis C virus infection cured after antiviral drug therapy 03/16/2012   Health care maintenance 12/15/2011   Opioid dependence ([place]) 10/05/2010   HTN (hypertension) 10/05/2010   Hyperlipidemia associated with type 2 diabetes mellitus ([place]) 10/23/2008   HIV disease ([place]) 05/09/2006   Depression 05/09/2006    Allergic rhinitis 05/09/2006   GERD 05/09/2006    REFERRING DIAG: M79.602 (ICD-10-CM) - Left arm pain.  Left shoulder arthroscopy with debridement and rotator cuff tear repair.  THERAPY DIAG:  Chronic left shoulder pain  Muscle weakness (generalized)  Stiffness of left shoulder, not elsewhere classified  Rationale for Evaluation and Treatment Rehabilitation  Onset: 08/31/21   SUBJECTIVE:         Subjective statement:    Pt reports seeing Dr. Marlou Sa yesterday and her gabapentin was increased. She notes he wants her to complete PT for 1 more month.                                                                                                                                                              NPRS: 8.5/10 Pain location: L shoulder  Pain description: ache , throb, sharp Aggravating factors: L shoulder movement Relieving factors: Ice man, rest, medications Pain range 7-8/10   PRECAUTIONS: Other: per Dr. Randel Pigg visit note, pt may DC sling with start of PT. Pt is not to completing ligting of the L arm  From: Donella Stade, PA-C  Should be good for full active range of motion and early rotator cuff strengthening exercises starting on 10/13/2021 which will be about 6 weeks out from surgery.     PERTINENT HISTORY: left shoulder arthroscopy debridement rotator cuff tear repair 08/31/21, arthritis, anxiety, CHF, DM   WEIGHT BEARING RESTRICTIONS No   FALLS:  Has patient fallen in last 6 months? No   LIVING ENVIRONMENT: Lives with: lives with their family and lives alone Lives in: House/apartment Pt is able to access and be mobile with in her home   OCCUPATION: Disabled   PLOF: Independent   PATIENT GOALS Use it with less pain, better use with cooking and cleaning   OBJECTIVE: (objective measures completed at initial evaluation unless otherwise dated)   DIAGNOSTIC FINDINGS:  L shoulder MRI 05/25/21 IMPRESSION: 1. Chronic calcific tendinosis of the posterior  supraspinatus with moderate intermediate T2 signal and thickening tendinosis of the mid to posterior  supraspinatus and anterior infraspinatus. 2. Additional likely chronic calcific tendinosis of the superior subscapularis tendon along with a moderate partial-thickness tear. 3. Status post distal clavicle excision and acromioplasty. 4. Mild subacromial/subdeltoid bursitis. 5. Mild-to-moderate glenohumeral cartilage degenerative changes   PATIENT SURVEYS:  Quick Dash 53/55. 10/20/21=43/55   COGNITION:           Overall cognitive status: Within functional limits for tasks assessed                                  SENSATION: WFL   POSTURE: Forward head c CT step off and dorsal hump, increased kyphosis, rounded shoulders,    UPPER EXTREMITY ROM:    Passive ROM Right 09/21/2021 Left 09/21/2021 LT 10/01/21 LT 10/11/21 LT AROM  10/25/21 LT AROM 11/02/21  Shoulder flexion 160 105 d, limited by pain 125, limited by pain, empty endfeel 140, limited by pain c empty end feel 110 120  Shoulder extension   115, limited by pain      Shoulder abduction 165   115, limited by pain, empty end feel 130, limited by pain c empty end feel 90 90  Shoulder adduction          Shoulder internal rotation T7        Shoulder external rotation T6  45d Limited by pain 45d, not to exceed yet     Elbow flexion          Elbow extension          Wrist flexion          Wrist extension          Wrist ulnar deviation          Wrist radial deviation          Wrist pronation          Wrist supination          Empty end feel for PROM for L shoulder flexion, abd, and ER (Blank rows = not tested)   UPPER EXTREMITY MMT:                       L not tested due to protocol   PALPATION:  TTP of the upper trap and supraspinatus region   OBSERVATION: Steri-strips covering the incision             TODAY'S TREATMENT:  [facility] Adult PT Treatment:                                                DATE: 11/04/21 Therapeutic  Exercise: Shoulder pulleys flexion and scaption 2 min each Shoulder row 2x15 RTB Shoulder ext 2x15 RTB Shoulder ER 2x10 YTB Shoulder IR 2x10 YTB AAROM wall slides for flex and abd x5 Standing 90d circles c ball x20 each direction Modalities: Vaso x 15 mins minimal compression, 36dF, left shoulder  [facility] Adult PT Treatment:                                                DATE: 11/02/21 Therapeutic Exercise: Shoulder pulleys flexion and scaption 2 min each SL L shoulder ER 2x10, no wt.  Supine small shoulder motion, clock pattern 2x 1 min Shoulder row 2x15 RTB Shoulder ext 2x15 RTB AAROM wall slides for flex and abd x5 Isometrics for shoulder flex, abd, ext, ER, IR x5 5" each Modalities: Vaso x 15 mins minimal compression, 36dF, left shoulder  [facility] Adult PT Treatment:                                                DATE: 10/27/21 Therapeutic Exercise: Shoulder pulleys flexion and scaption 2 min each SL L shoulder ER 2x10, no wt. Supine small shoulder motion, clock pattern 2x 1 min Shoulder row 2x10 RTB Shoulder ext 2x10 RTB Shoulder- finger ladder x 2  AAROM wall slides x5 Isometrics for shoulder flex, abd, ext, ER, IR x5 5" each Modalities: Vaso x 10 min med compression, 36dF, left shoulder    PATIENT EDUCATION: Education details: Eval findings, POC, HEP Person educated: Patient Education method: Explanation, Demonstration, Tactile cues, Verbal cues, and Handouts Education comprehension: verbalized understanding, returned demonstration, verbal cues required, and tactile cues required     HOME EXERCISE PROGRAM: Access Code: [email or web address] URL: [email or web address] Date: 10/14/2021 Prepared by: Gar Ponto  Exercises - Flexion-Extension Shoulder Pendulum with Table Support  - 2 x daily - 7 x weekly - 2 sets - 10 reps - Horizontal Shoulder Pendulum with Table Support  - 2 x daily - 7 x weekly - 2 sets - 10 reps - Seated Shoulder Flexion Slide at Table Top with  Forearm in Neutral  - 2 x daily - 7 x weekly - 1 sets - 5 reps - 5 hold - Seated Shoulder Scaption Slide at Table Top with Forearm in Neutral  - 2 x daily - 7 x weekly - 1 sets - 5 reps - 5 hold - Seated Shoulder External Rotation PROM on Table  - 2 x daily - 7 x weekly - 1 sets - 5 reps - 5 hold - Isometric Shoulder Flexion at Wall  - 2 x daily - 7 x weekly - 1 sets - 5 reps - Isometric Shoulder Extension at Wall  - 2 x daily - 7 x weekly - 1 sets - 5 reps - 5 hold - Isometric Shoulder Abduction at Wall  - 2 x daily - 7 x weekly - 1 sets - 5 reps - 5 hold - Standing Isometric Shoulder External Rotation with Doorway  - 2 x daily - 7 x weekly - 1 sets - 5 reps - 5 hold - Standing Isometric Shoulder Internal Rotation at Doorway  - 2 x daily - 7 x weekly - 1 sets - 5 reps - 5 hold - Standing Shoulder Row with Anchored Resistance  - 2 x daily - 7 x weekly - 2 sets - 10 reps - 3 hold - Shoulder extension with resistance - Neutral  - 2 x daily - 7 x weekly - 3 sets - 10 reps - 3 hold   ASSESSMENT:   CLINICAL IMPRESSION: Pt participated in PT for L shoulder gradual progression of strengthening of the peri-scapular and rotator cuff muscles.  Pt is making appropriate progress with ROM and strength. Her pain level continues to be high, but pt is able to participate in PT and is managed c vaso. Pt does not experience an increase in that lasts more than 1 to 2 hours post the session. Pt will continue  to benefit from skilled PT to address impairments for improved L shoulder/UE functionwith less pain.  OBJECTIVE IMPAIRMENTS decreased ROM, decreased strength, impaired UE functional use, postural dysfunction, and pain.    ACTIVITY LIMITATIONS carrying, lifting, sleeping, dressing, and reach over head   PARTICIPATION LIMITATIONS: meal prep, cleaning, laundry, driving, shopping, community activity, and yard work   PERSONAL FACTORS Past/current experiences, Time since onset of injury/illness/exacerbation, and 1  comorbidity: DM  are also affecting patient's functional outcome.    REHAB POTENTIAL: Good   CLINICAL DECISION MAKING: Stable/uncomplicated   EVALUATION COMPLEXITY: Low     GOALS:   SHORT TERM GOALS: Target date: 10/12/2021  (Remove Blue Hyperlink)   Pt will be Ind in an initial HEP Baseline:started on eval Goal status: MET   2.  Increase L shoulder flexion PROM to 125d and abd to 135d to progress to proper R shoulder function Baseline: flex= 105d and abd= 115d Goal status: MET   LONG TERM GOALS: Target date: 11/19/21   Increase L shoulder strength to 4 or > for improved R shoulder function Baseline: NT Status: 11/01/21: grossly 3/5  Goal status: Improving   2.  Increase L shoulder AROM to at least 85% of the R shoulder to complete above shoulder reaching  Baseline: See flow sheet Status; 11/01/21: Flexion 120d, abd 90d Goal status: Improving   3. Decrease L shoulder pain to 3/10 or less for improved L UE function and QOL  Baseline: 4-10/10 Status 7/10 Goal status: No improvement   4.  Pt will complete L shoulder above shoulder reaching c min to zero scapular shrug Baseline:  Goal status: INITIAL   5.  Pt will score 27 or less with the UE Quickdash as indication of improved function Baseline: 53 Status:11/01/21: 43/55 Goal status: Improving    6.  Pt will be Ind in a final HEP to maintain achieved LOF and QOL Baselinestarted on eval  Goal status: INITIAL     PLAN: PT FREQUENCY: 1w2, 2w6   PT DURATION: 8 weeks   PLANNED INTERVENTIONS: Therapeutic exercises, Therapeutic activity, Neuromuscular re-education, Balance training, Gait training, Patient/Family education, Joint mobilization, Aquatic Therapy, Dry Needling, Electrical stimulation, Cryotherapy, Moist heat, Taping, Vasopneumatic device, Ultrasound, Ionotophoresis 48m/ml Dexamethasone, Manual therapy, and Re-evaluation   PLAN FOR NEXT SESSION: Assess response to HEP. Progress PROM and AAROM as tolerated.  Initiate isometric strengthening exs.How was MD visit , repeat vaso, consider estim.  Sevyn Paredez MS, PT 11/04/21 1:31 PM

## 2021-11-03 NOTE — Progress Notes (Signed)
Post-Op Visit Note   Patient: [name] [name]           Date of Birth: Aug 19, 1960           MRN: [id number] Visit Date: 11/03/2021 PCP: [name], MD   Assessment & Plan:  Chief Complaint:  Chief Complaint  Patient presents with   Left Shoulder - Routine Post Op     08/31/21 (9w 1d) Left Shoulder Arthroscopy, Debridement,  Rotator Cuff Tear Repair - Left     Visit Diagnoses:  1. Status post rotator cuff repair     Plan: [name] is a 61 year old patient is now 2 months out left shoulder arthroscopy with rotator cuff repair.  Still having some pain.  Photos are reviewed.  She did have some arthritis in the superior aspect of the humeral head.  Going to physical therapy.  Hard for her to sleep.  On exam she is got some grinding and with a little bit more weakness than I would expect at 2 months postop.  I think it is okay for her to get some epidural steroid injections into her neck or lower back.  Okay to increase Neurontin to 300 mg twice a day.  4-week return to assess the necessity for continued physical therapy.  Follow-Up Instructions: No follow-ups on file.   Orders:  No orders of the defined types were placed in this encounter.  Meds ordered this encounter  Medications   gabapentin (NEURONTIN) 300 MG capsule    Sig: Take 1 capsule (300 mg total) by mouth 2 (two) times daily.    Dispense:  60 capsule    Refill:  0    Imaging: No results found.  PMFS History: Patient Active Problem List   Diagnosis Date Noted   Synovitis of left shoulder    Complete tear of left rotator cuff    Acute otitis media with perforated tympanic membrane, right 08/03/2021   Impingement syndrome of left shoulder 07/01/2021   Left leg swelling 07/09/2020   Grief reaction 06/08/2020   Arm pain 02/10/2020   Dizziness on standing 12/27/2019   Generalized anxiety disorder with panic attacks 06/20/2019   Osteoarthritis of thumb, right 11/29/2018   Chronic migraine w/o aura w/o status  migrainosus, not intractable 08/27/2018   Disorder of left eustachian tube 05/19/2017   Vertigo of central origin 01/23/2017   Central perforation of tympanic membrane of left ear 12/09/2016   Eustachian tube dysfunction, bilateral 06/15/2016   Ear pain, right 06/15/2016   Spondylosis of lumbar region without myelopathy or radiculopathy 10/08/2015   BPPV (benign paroxysmal positional vertigo) 10/02/2015   Liver fibrosis ([place]) 12/04/2014   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   Vitamin D deficiency 06/29/2014   Secondary adrenal insufficiency ([place]) 06/29/2014   History of tympanostomy tube placement 02/07/2014   Refusal of blood transfusions as patient is Jehovah's Witness 11/18/2013   Hepatitis C virus infection cured after antiviral drug therapy 03/16/2012   Health care maintenance 12/15/2011   Opioid dependence ([place]) 10/05/2010   HTN (hypertension) 10/05/2010   Hyperlipidemia associated with type 2 diabetes mellitus ([place]) 10/23/2008   HIV disease ([place]) 05/09/2006   Depression 05/09/2006   Allergic rhinitis 05/09/2006   GERD 05/09/2006   Past Medical History:  Diagnosis Date   Allergic rhinitis 05/09/2006   Allergy    Anxiety    Arthritis    Asthma    CHF (congestive heart failure) (HCC)    Chronic back pain  Diabetes mellitus without complication (HCC)    GERD (gastroesophageal reflux disease)    Heart murmur    as a child   Hepatitis C    genotype 1b, 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 s/p 12 week course of simeprevir and sofosbuvir between October 2014 and January 2015 with resolution.   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   Pneumonia    Prediabetes    Refusal of blood transfusions as patient is Jehovah's Witness    Secondary adrenal insufficiency ([place]) 06/29/2014    Urticaria     Family History  Problem Relation Age of Onset   Heart disease Mother    Diabetes Mother    Stroke Mother    Heart disease Father    Stroke Father    Diabetes Father    Hepatitis Sister        hcv   Asthma Sister    Allergic rhinitis Sister    Stroke Other    Colon polyps Brother    Renal Disease Brother    Cancer Sister        lung   Asthma Sister    Allergic rhinitis Sister    Cancer Maternal Aunt    Cancer Maternal Aunt    Colon cancer Neg Hx    Esophageal cancer Neg Hx    Stomach cancer Neg Hx    Rectal cancer Neg Hx    Angioedema Neg Hx    Eczema Neg Hx    Urticaria Neg Hx     Past Surgical History:  Procedure Laterality Date   BUNIONECTOMY     b/l   COLECTOMY     2003 for diverticulitis, had colostomy bag and then reversed   COLONOSCOPY     HAND SURGERY     HAND SURGERY Right    right thumb surgery Dr Weingold 2021   [place] ARTHROSCOPY WITH OPEN ROTATOR CUFF REPAIR AND DISTAL CLAVICLE ACROMINECTOMY Left 08/31/2021   Procedure: LEFT SHOULDER ARTHROSCOPY, DEBRIDEMENT,  ROTATOR CUFF TEAR REPAIR;  Surgeon: Meredith Pel, MD;  Location: [place];  Service: Orthopedics;  Laterality: Left;   SHOULDER SURGERY     left   TONSILLECTOMY     Social History   Occupational History   Occupation: Disabled  Tobacco Use   Smoking status: Former    Years: 20.00    Types: Cigarettes    Quit date: 04/26/2007    Years since quitting: 14.5   Smokeless tobacco: Never   Tobacco comments:    QUIT 2009  Vaping Use   Vaping Use: Never used  Substance and Sexual Activity   Alcohol use: No    Alcohol/week: 0.0 standard drinks of alcohol   Drug use: Not Currently    Types: Marijuana    Comment: cannabis in the past   Sexual activity: Not Currently    Partners: Male    Birth control/protection: Condom    Comment: declined condoms

## 2021-11-03 NOTE — Telephone Encounter (Signed)
Spoke with patient and informed her that she can have the injection. Transferred to front desk to schedule appointment

## 2021-11-04 ENCOUNTER — Ambulatory Visit: Payer: Medicaid Other

## 2021-11-04 ENCOUNTER — Telehealth: Payer: Self-pay | Admitting: Orthopedic Surgery

## 2021-11-04 DIAGNOSIS — G8929 Other chronic pain: Secondary | ICD-10-CM

## 2021-11-04 DIAGNOSIS — M25612 Stiffness of left shoulder, not elsewhere classified: Secondary | ICD-10-CM

## 2021-11-04 DIAGNOSIS — M6281 Muscle weakness (generalized): Secondary | ICD-10-CM

## 2021-11-04 DIAGNOSIS — M25512 Pain in left shoulder: Secondary | ICD-10-CM | POA: Diagnosis not present

## 2021-11-04 NOTE — Telephone Encounter (Signed)
Patient called asked if Dr Marlou Sa can up the dosage on her medication Diclofenac and cancel the 800 mg of Gabapentin. Patient said she do not want the Gabapentin. The number to contact patient is [phone]

## 2021-11-05 ENCOUNTER — Telehealth: Payer: Self-pay | Admitting: Orthopedic Surgery

## 2021-11-05 ENCOUNTER — Other Ambulatory Visit: Payer: Self-pay | Admitting: Surgical

## 2021-11-05 ENCOUNTER — Telehealth: Payer: Self-pay | Admitting: Surgical

## 2021-11-05 NOTE — Telephone Encounter (Signed)
Pt is wanting a high dose and refill on   diclofenac (VOLTAREN) 75 MG EC tablet

## 2021-11-05 NOTE — Telephone Encounter (Signed)
Pt called requesting diclofenac. Pt states Dr. Arminda Resides sent in gabapentin but she dont take that and told pharmacy not to fill. Please send medication before the end of business day please. Pt states she need it before the weekend. She is in pain. Please send to [facility]. Pt phone number is [phone].

## 2021-11-06 NOTE — Telephone Encounter (Signed)
Okay for diclofenac 75 mg by mouth twice a day for 3 weeks then once a day thereafter #60.  Okay to discontinue gabapentin.  Thanks

## 2021-11-08 NOTE — Telephone Encounter (Signed)
[name] [name] addressed this on 07/14

## 2021-11-08 NOTE — Telephone Encounter (Signed)
Luke sent on 07/14.

## 2021-11-09 ENCOUNTER — Ambulatory Visit: Payer: Medicaid Other

## 2021-11-09 ENCOUNTER — Other Ambulatory Visit: Payer: Self-pay | Admitting: Obstetrics and Gynecology

## 2021-11-09 NOTE — Patient Outreach (Signed)
Care Coordination  11/09/2021  [name] [name] 1961-04-19 [id number]  RNCM returned patient's phone call.  Patient reports that she continues to have problems with [facility] transportation.  Patient had PT appointment scheduled for this morning, she called yesterday to confirm and they did not have her scheduled even though she made the appointment some time ago and had a confirmation number.  Patient spoke to Supervisor in which the call was dropped and rescheduled the appointment.  Patient has also flied a formal complaint regarding transportation.  Aida Raider RN, BSN [place]  [facility] [employer] - Managed Medicaid High Risk [phone].

## 2021-11-11 ENCOUNTER — Ambulatory Visit
Admission: RE | Admit: 2021-11-11 | Discharge: 2021-11-11 | Disposition: A | Discharge status: Home / Self Care | Payer: Medicaid Other | Source: Ambulatory Visit

## 2021-11-11 DIAGNOSIS — Z1231 Encounter for screening mammogram for malignant neoplasm of breast: Secondary | ICD-10-CM | POA: Diagnosis not present

## 2021-11-12 ENCOUNTER — Ambulatory Visit: Payer: Medicaid Other | Admitting: Physical Therapy

## 2021-11-12 ENCOUNTER — Encounter: Payer: Self-pay | Admitting: Physical Therapy

## 2021-11-12 DIAGNOSIS — G8929 Other chronic pain: Secondary | ICD-10-CM

## 2021-11-12 DIAGNOSIS — M25612 Stiffness of left shoulder, not elsewhere classified: Secondary | ICD-10-CM

## 2021-11-12 DIAGNOSIS — M25512 Pain in left shoulder: Secondary | ICD-10-CM | POA: Diagnosis not present

## 2021-11-12 DIAGNOSIS — M6281 Muscle weakness (generalized): Secondary | ICD-10-CM

## 2021-11-12 NOTE — Therapy (Signed)
OUTPATIENT PHYSICAL THERAPY TREATMENT NOTE/Progress Note   Patient Name: [name] [name] MRN: [id number] DOB:March 28, 1961, 61 y.o., female Today's Date: 11/12/2021  PCP: [name], MD  REFERRING PROVIDER: Meredith Pel, MD         END OF SESSION:   PT End of Session - 11/12/21 1106     Visit Number 11    Number of Visits 15    Date for PT Re-Evaluation 11/18/21    Authorization Type [place] MEDICAID [place] - Number of Visits 27    Progress Note Due on Visit 10    PT Start Time 1105    PT Stop Time [id number]    PT Time Calculation (min) 40 min                    Past Medical History:  Diagnosis Date   Allergic rhinitis 05/09/2006   Allergy    Anxiety    Arthritis    Asthma    CHF (congestive heart failure) (HCC)    Chronic back pain    Diabetes mellitus without complication (HCC)    GERD (gastroesophageal reflux disease)    Heart murmur    as a child   Hepatitis C    genotype 1b, 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 s/p 12 week course of simeprevir and sofosbuvir between October 2014 and January 2015 with resolution.   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   Pneumonia    Prediabetes    Refusal of blood transfusions as patient is Jehovah's Witness    Secondary adrenal insufficiency ([place]) 06/29/2014   Urticaria    Past Surgical History:  Procedure Laterality Date   BUNIONECTOMY     b/l   COLECTOMY     2003 for diverticulitis, had colostomy bag and then reversed   COLONOSCOPY     HAND SURGERY     HAND SURGERY Right    right thumb surgery Dr Burney Gauze 2021   NASAL SINUS SURGERY     SHOULDER ARTHROSCOPY WITH OPEN ROTATOR CUFF REPAIR AND DISTAL CLAVICLE ACROMINECTOMY Left 08/31/2021   Procedure: LEFT SHOULDER ARTHROSCOPY, DEBRIDEMENT,  ROTATOR CUFF TEAR REPAIR;  Surgeon: Meredith Pel, MD;  Location: [facility];  Service: Orthopedics;  Laterality: Left;   SHOULDER SURGERY     left   TONSILLECTOMY     Patient Active Problem List   Diagnosis Date Noted   Synovitis of left shoulder    Complete tear of left rotator cuff    Acute otitis media with perforated tympanic membrane, right 08/03/2021   Impingement syndrome of left shoulder 07/01/2021   Left leg swelling 07/09/2020   Grief reaction 06/08/2020   Arm pain 02/10/2020   Dizziness on standing 12/27/2019   Generalized anxiety disorder with panic attacks 06/20/2019   Osteoarthritis of thumb, right 11/29/2018   Chronic migraine w/o aura w/o status migrainosus, not intractable 08/27/2018   Disorder of left eustachian tube 05/19/2017   Vertigo of central origin 01/23/2017   Central perforation of tympanic membrane of left ear 12/09/2016   Eustachian tube dysfunction, bilateral 06/15/2016   Ear pain, right 06/15/2016   Spondylosis of lumbar region without myelopathy or radiculopathy 10/08/2015   BPPV (benign paroxysmal positional vertigo) 10/02/2015   Liver fibrosis ([place]) 12/04/2014   Pituitary  microadenoma ([place]) 08/08/2014   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   Vitamin D deficiency 06/29/2014   Secondary adrenal insufficiency ([place]) 06/29/2014   History of tympanostomy tube placement 02/07/2014   Refusal of blood transfusions as patient is Jehovah's Witness 11/18/2013   Hepatitis C virus infection cured after antiviral drug therapy 03/16/2012   Health care maintenance 12/15/2011   Opioid dependence ([place]) 10/05/2010   HTN (hypertension) 10/05/2010   Hyperlipidemia associated with type 2 diabetes mellitus ([place]) 10/23/2008   HIV disease ([place]) 05/09/2006   Depression 05/09/2006   Allergic rhinitis 05/09/2006   GERD 05/09/2006    REFERRING DIAG: M79.602 (ICD-10-CM) - Left arm pain.  Left shoulder arthroscopy with debridement and rotator cuff tear repair.  THERAPY DIAG:  Chronic left  shoulder pain  Muscle weakness (generalized)  Stiffness of left shoulder, not elsewhere classified  Rationale for Evaluation and Treatment Rehabilitation  Onset: 08/31/21   SUBJECTIVE:        My arm hurts all the time. Reaching is the worst. My bicep is the worst. And at night it hurts a lot in the neck and shoulder.                                                                                                                                                              NPRS: 0/10, 8/10 with reaching  Pain location: L shoulder  Pain description: ache , throb, sharp Aggravating factors: L shoulder movement Relieving factors: Ice man, rest, medications Pain range 7-8/10   PRECAUTIONS: Other: per Dr. Randel Pigg visit note, pt may DC sling with start of PT. Pt is not to completing ligting of the L arm  From: Donella Stade, PA-C  Should be good for full active range of motion and early rotator cuff strengthening exercises starting on 10/13/2021 which will be about 6 weeks out from surgery.     PERTINENT HISTORY: left shoulder arthroscopy debridement rotator cuff tear repair 08/31/21, arthritis, anxiety, CHF, DM   WEIGHT BEARING RESTRICTIONS No   FALLS:  Has patient fallen in last 6 months? No   LIVING ENVIRONMENT: Lives with: lives with their family and lives alone Lives in: House/apartment Pt is able to access and be mobile with in her home   OCCUPATION: Disabled   PLOF: Independent   PATIENT GOALS Use it with less pain, better use with cooking and cleaning   OBJECTIVE: (objective measures completed at initial evaluation unless otherwise dated)   DIAGNOSTIC FINDINGS:  L shoulder MRI 05/25/21 IMPRESSION: 1. Chronic calcific tendinosis of the posterior supraspinatus with moderate intermediate T2 signal and thickening tendinosis of the mid to posterior supraspinatus and anterior infraspinatus. 2. Additional likely chronic calcific tendinosis of the superior subscapularis  tendon along with a moderate partial-thickness tear. 3. Status post distal clavicle excision  and acromioplasty. 4. Mild subacromial/subdeltoid bursitis. 5. Mild-to-moderate glenohumeral cartilage degenerative changes   PATIENT SURVEYS:  Quick Dash 53/55. 10/20/21=43/55   COGNITION:           Overall cognitive status: Within functional limits for tasks assessed                                  SENSATION: WFL   POSTURE: Forward head c CT step off and dorsal hump, increased kyphosis, rounded shoulders,    UPPER EXTREMITY ROM:    Passive ROM Right 09/21/2021 Left 09/21/2021 LT 10/01/21 LT 10/11/21 LT AROM  10/25/21 LT AROM 11/02/21  Shoulder flexion 160 105 d, limited by pain 125, limited by pain, empty endfeel 140, limited by pain c empty end feel 110 120  Shoulder extension   115, limited by pain      Shoulder abduction 165   115, limited by pain, empty end feel 130, limited by pain c empty end feel 90 90  Shoulder adduction          Shoulder internal rotation T7        Shoulder external rotation T6  45d Limited by pain 45d, not to exceed yet     Elbow flexion          Elbow extension          Wrist flexion          Wrist extension          Wrist ulnar deviation          Wrist radial deviation          Wrist pronation          Wrist supination          Empty end feel for PROM for L shoulder flexion, abd, and ER (Blank rows = not tested)   UPPER EXTREMITY MMT:                       L not tested due to protocol   PALPATION:  TTP of the upper trap and supraspinatus region   OBSERVATION: Steri-strips covering the incision             TODAY'S TREATMENT:  [facility] Adult PT Treatment:                                                DATE: 11/12/21 Therapeutic Exercise: Shoulder pulleys flexion and scaption 2 min each Eccentric bicep curl 2# 10 x 1 palm up, x 10 palm in Shoulder row 2x15 RTB Shoulder ext 2x15 RTB Shoulder ER 1x10 RTB Shoulder IR 1x10 RTB Supine shoulder flexion  circles 2# -pain, better tolerated with 1# clockwise and counter clockwise Supine horizontal abduction red band x 15  Modalities: Vaso x 15 mins minimal compression, 36dF, left shoulder  [facility] Adult PT Treatment:                                                DATE: 11/04/21 Therapeutic Exercise: Shoulder pulleys flexion and scaption 2 min each Shoulder row 2x15 RTB Shoulder ext 2x15 RTB Shoulder ER 2x10 YTB Shoulder IR 2x10  YTB AAROM wall slides for flex and abd x5 Standing 90d circles c ball x20 each direction Modalities: Vaso x 15 mins minimal compression, 36dF, left shoulder  [facility] Adult PT Treatment:                                                DATE: 11/02/21 Therapeutic Exercise: Shoulder pulleys flexion and scaption 2 min each SL L shoulder ER 2x10, no wt. Supine small shoulder motion, clock pattern 2x 1 min Shoulder row 2x15 RTB Shoulder ext 2x15 RTB AAROM wall slides for flex and abd x5 Isometrics for shoulder flex, abd, ext, ER, IR x5 5" each Modalities: Vaso x 15 mins minimal compression, 36dF, left shoulder  [facility] Adult PT Treatment:                                                DATE: 10/27/21 Therapeutic Exercise: Shoulder pulleys flexion and scaption 2 min each SL L shoulder ER 2x10, no wt. Supine small shoulder motion, clock pattern 2x 1 min Shoulder row 2x10 RTB Shoulder ext 2x10 RTB Shoulder- finger ladder x 2  AAROM wall slides x5 Isometrics for shoulder flex, abd, ext, ER, IR x5 5" each Modalities: Vaso x 10 min med compression, 36dF, left shoulder    PATIENT EDUCATION: Education details: Eval findings, POC, HEP Person educated: Patient Education method: Explanation, Demonstration, Tactile cues, Verbal cues, and Handouts Education comprehension: verbalized understanding, returned demonstration, verbal cues required, and tactile cues required     HOME EXERCISE PROGRAM: Access Code: [email or web address] URL: [email or web address] Date:  10/14/2021 Prepared by: Gar Ponto  Exercises - Flexion-Extension Shoulder Pendulum with Table Support  - 2 x daily - 7 x weekly - 2 sets - 10 reps - Horizontal Shoulder Pendulum with Table Support  - 2 x daily - 7 x weekly - 2 sets - 10 reps - Seated Shoulder Flexion Slide at Table Top with Forearm in Neutral  - 2 x daily - 7 x weekly - 1 sets - 5 reps - 5 hold - Seated Shoulder Scaption Slide at Table Top with Forearm in Neutral  - 2 x daily - 7 x weekly - 1 sets - 5 reps - 5 hold - Seated Shoulder External Rotation PROM on Table  - 2 x daily - 7 x weekly - 1 sets - 5 reps - 5 hold - Isometric Shoulder Flexion at Wall  - 2 x daily - 7 x weekly - 1 sets - 5 reps - Isometric Shoulder Extension at Wall  - 2 x daily - 7 x weekly - 1 sets - 5 reps - 5 hold - Isometric Shoulder Abduction at Wall  - 2 x daily - 7 x weekly - 1 sets - 5 reps - 5 hold - Standing Isometric Shoulder External Rotation with Doorway  - 2 x daily - 7 x weekly - 1 sets - 5 reps - 5 hold - Standing Isometric Shoulder Internal Rotation at Doorway  - 2 x daily - 7 x weekly - 1 sets - 5 reps - 5 hold - Standing Shoulder Row with Anchored Resistance  - 2 x daily - 7 x weekly - 2 sets - 10 reps - 3 hold - Shoulder extension  with resistance - Neutral  - 2 x daily - 7 x weekly - 3 sets - 10 reps - 3 hold   ASSESSMENT:   CLINICAL IMPRESSION: Pt participated in PT for L shoulder gradual progression of strengthening of the peri-scapular and rotator cuff muscles.  Pt is making appropriate progress with ROM and strength. Her pain level continues to be high, but pt is able to participate in PT and is managed c vaso. Pt does not experience an increase in that lasts more than 1 to 2 hours post the session. Pt will continue to benefit from skilled PT to address impairments for improved L shoulder/UE functionwith less pain.  OBJECTIVE IMPAIRMENTS decreased ROM, decreased strength, impaired UE functional use, postural dysfunction, and pain.     ACTIVITY LIMITATIONS carrying, lifting, sleeping, dressing, and reach over head   PARTICIPATION LIMITATIONS: meal prep, cleaning, laundry, driving, shopping, community activity, and yard work   PERSONAL FACTORS Past/current experiences, Time since onset of injury/illness/exacerbation, and 1 comorbidity: DM  are also affecting patient's functional outcome.    REHAB POTENTIAL: Good   CLINICAL DECISION MAKING: Stable/uncomplicated   EVALUATION COMPLEXITY: Low     GOALS:   SHORT TERM GOALS: Target date: 10/12/2021  (Remove Blue Hyperlink)   Pt will be Ind in an initial HEP Baseline:started on eval Goal status: MET   2.  Increase L shoulder flexion PROM to 125d and abd to 135d to progress to proper R shoulder function Baseline: flex= 105d and abd= 115d Goal status: MET   LONG TERM GOALS: Target date: 11/19/21   Increase L shoulder strength to 4 or > for improved R shoulder function Baseline: NT Status: 11/01/21: grossly 3/5  Goal status: Improving   2.  Increase L shoulder AROM to at least 85% of the R shoulder to complete above shoulder reaching  Baseline: See flow sheet Status; 11/01/21: Flexion 120d, abd 90d Goal status: Improving   3. Decrease L shoulder pain to 3/10 or less for improved L UE function and QOL  Baseline: 4-10/10 Status 7/10 Goal status: No improvement   4.  Pt will complete L shoulder above shoulder reaching c min to zero scapular shrug Baseline:  Goal status: INITIAL   5.  Pt will score 27 or less with the UE Quickdash as indication of improved function Baseline: 53 Status:11/01/21: 43/55 Goal status: Improving    6.  Pt will be Ind in a final HEP to maintain achieved LOF and QOL Baselinestarted on eval  Goal status: INITIAL     PLAN: PT FREQUENCY: 1w2, 2w6   PT DURATION: 8 weeks   PLANNED INTERVENTIONS: Therapeutic exercises, Therapeutic activity, Neuromuscular re-education, Balance training, Gait training, Patient/Family education, Joint  mobilization, Aquatic Therapy, Dry Needling, Electrical stimulation, Cryotherapy, Moist heat, Taping, Vasopneumatic device, Ultrasound, Ionotophoresis 20m/ml Dexamethasone, Manual therapy, and Re-evaluation   PLAN FOR NEXT SESSION: Assess response to HEP. Progress PROM and AAROM as tolerated. Initiate isometric strengthening exs.repeat vaso, consider estim.  JHessie Diener PTA 11/12/21 11:37 AM Phone: [phone]: [phone]

## 2021-11-15 DIAGNOSIS — E2749 Other adrenocortical insufficiency: Secondary | ICD-10-CM | POA: Diagnosis not present

## 2021-11-15 DIAGNOSIS — E119 Type 2 diabetes mellitus without complications: Secondary | ICD-10-CM | POA: Diagnosis not present

## 2021-11-15 DIAGNOSIS — D352 Benign neoplasm of pituitary gland: Secondary | ICD-10-CM | POA: Diagnosis not present

## 2021-11-16 ENCOUNTER — Ambulatory Visit: Payer: Medicaid Other

## 2021-11-16 DIAGNOSIS — M25512 Pain in left shoulder: Secondary | ICD-10-CM | POA: Diagnosis not present

## 2021-11-16 DIAGNOSIS — M25612 Stiffness of left shoulder, not elsewhere classified: Secondary | ICD-10-CM

## 2021-11-16 DIAGNOSIS — M6281 Muscle weakness (generalized): Secondary | ICD-10-CM

## 2021-11-16 DIAGNOSIS — G8929 Other chronic pain: Secondary | ICD-10-CM

## 2021-11-16 NOTE — Therapy (Signed)
OUTPATIENT PHYSICAL THERAPY TREATMENT NOTE   Patient Name: [name] [name] MRN: [id number] DOB:08-Sep-1960, 61 y.o., female Today's Date: 11/16/2021  PCP: [name], MD  REFERRING PROVIDER: Meredith Pel, MD         END OF SESSION:   PT End of Session - 11/16/21 1112     Visit Number 12    Number of Visits 15    Date for PT Re-Evaluation 11/18/21    Authorization Type [place] MEDICAID [employer]    Authorization - Number of Visits 27    Progress Note Due on Visit 10    PT Start Time 1102    PT Stop Time 1150    PT Time Calculation (min) 48 min    Activity Tolerance Patient limited by pain;Patient tolerated treatment well    Behavior During Therapy [facility] for tasks assessed/performed                     Past Medical History:  Diagnosis Date   Allergic rhinitis 05/09/2006   Allergy    Anxiety    Arthritis    Asthma    CHF (congestive heart failure) (HCC)    Chronic back pain    Diabetes mellitus without complication (HCC)    GERD (gastroesophageal reflux disease)    Heart murmur    as a child   Hepatitis C    genotype 1b, 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 s/p 12 week course of simeprevir and sofosbuvir between October 2014 and January 2015 with resolution.   History of shingles    HIV infection ([place])    1994   Hyperlipidemia    no meds taken now   Hypertension    Migraine    Pituitary microadenoma ([place]) 08/08/2014   Pneumonia    Prediabetes    Refusal of blood transfusions as patient is Jehovah's Witness    Secondary adrenal insufficiency ([place]) 06/29/2014   Urticaria    Past Surgical History:  Procedure Laterality Date   BUNIONECTOMY     b/l   COLECTOMY     2003 for diverticulitis, had colostomy bag and then reversed   COLONOSCOPY     HAND SURGERY     HAND SURGERY Right    right thumb surgery Dr Burney Gauze 2021   NASAL SINUS SURGERY     SHOULDER ARTHROSCOPY WITH OPEN ROTATOR CUFF REPAIR AND DISTAL  CLAVICLE ACROMINECTOMY Left 08/31/2021   Procedure: LEFT SHOULDER ARTHROSCOPY, DEBRIDEMENT,  ROTATOR CUFF TEAR REPAIR;  Surgeon: Meredith Pel, MD;  Location: [place];  Service: Orthopedics;  Laterality: Left;   SHOULDER SURGERY     left   TONSILLECTOMY     Patient Active Problem List   Diagnosis Date Noted   Synovitis of left shoulder    Complete tear of left rotator cuff    Acute otitis media with perforated tympanic membrane, right 08/03/2021   Impingement syndrome of left shoulder 07/01/2021   Left leg swelling 07/09/2020   Grief reaction 06/08/2020   Arm pain 02/10/2020   Dizziness on standing 12/27/2019   Generalized anxiety disorder with panic attacks 06/20/2019   Osteoarthritis of thumb, right 11/29/2018   Chronic migraine w/o aura w/o status migrainosus, not intractable 08/27/2018   Disorder of left eustachian tube 05/19/2017   Vertigo of central origin 01/23/2017   Central perforation of tympanic membrane of left ear 12/09/2016   Eustachian tube dysfunction, bilateral 06/15/2016   Ear pain, right 06/15/2016   Spondylosis of lumbar region  without myelopathy or radiculopathy 10/08/2015   BPPV (benign paroxysmal positional vertigo) 10/02/2015   Liver fibrosis ([place]) 12/04/2014   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   Vitamin D deficiency 06/29/2014   Secondary adrenal insufficiency ([place]) 06/29/2014   History of tympanostomy tube placement 02/07/2014   Refusal of blood transfusions as patient is Jehovah's Witness 11/18/2013   Hepatitis C virus infection cured after antiviral drug therapy 03/16/2012   Health care maintenance 12/15/2011   Opioid dependence ([place]) 10/05/2010   HTN (hypertension) 10/05/2010   Hyperlipidemia associated with type 2 diabetes mellitus ([place]) 10/23/2008   HIV disease ([place]) 05/09/2006   Depression 05/09/2006   Allergic rhinitis 05/09/2006   GERD 05/09/2006    REFERRING DIAG: M79.602  (ICD-10-CM) - Left arm pain.  Left shoulder arthroscopy with debridement and rotator cuff tear repair.  THERAPY DIAG:  Chronic left shoulder pain  Muscle weakness (generalized)  Stiffness of left shoulder, not elsewhere classified  Rationale for Evaluation and Treatment Rehabilitation  Onset: 08/31/21   SUBJECTIVE:      Pt reports her L shoulder is hurting more today and she is not sure why. She also reports intermittent numbness of her L hand.                                                                                                                                                              NPRS: 0/10, 8/10 with reaching  Pain location: L shoulder  Pain description: ache , throb, sharp Aggravating factors: L shoulder movement Relieving factors: Ice man, rest, medications Pain range 7-8/10   PRECAUTIONS: Other: per Dr. Randel Pigg visit note, pt may DC sling with start of PT. Pt is not to completing ligting of the L arm  From: Donella Stade, PA-C  Should be good for full active range of motion and early rotator cuff strengthening exercises starting on 10/13/2021 which will be about 6 weeks out from surgery.     PERTINENT HISTORY: left shoulder arthroscopy debridement rotator cuff tear repair 08/31/21, arthritis, anxiety, CHF, DM   WEIGHT BEARING RESTRICTIONS No   FALLS:  Has patient fallen in last 6 months? No   LIVING ENVIRONMENT: Lives with: lives with their family and lives alone Lives in: House/apartment Pt is able to access and be mobile with in her home   OCCUPATION: Disabled   PLOF: Independent   PATIENT GOALS Use it with less pain, better use with cooking and cleaning   OBJECTIVE: (objective measures completed at initial evaluation unless otherwise dated)   DIAGNOSTIC FINDINGS:  L shoulder MRI 05/25/21 IMPRESSION: 1. Chronic calcific tendinosis of the posterior supraspinatus with moderate intermediate T2 signal and thickening tendinosis of the mid to  posterior supraspinatus and anterior infraspinatus. 2. Additional likely chronic calcific tendinosis  of the superior subscapularis tendon along with a moderate partial-thickness tear. 3. Status post distal clavicle excision and acromioplasty. 4. Mild subacromial/subdeltoid bursitis. 5. Mild-to-moderate glenohumeral cartilage degenerative changes   PATIENT SURVEYS:  Quick Dash 53/55. 10/20/21=43/55   COGNITION:           Overall cognitive status: Within functional limits for tasks assessed                                  SENSATION: WFL   POSTURE: Forward head c CT step off and dorsal hump, increased kyphosis, rounded shoulders,    UPPER EXTREMITY ROM:    Passive ROM Right 09/21/2021 Left 09/21/2021 LT 10/01/21 LT 10/11/21 LT AROM  10/25/21 LT AROM 11/02/21  Shoulder flexion 160 105 d, limited by pain 125, limited by pain, empty endfeel 140, limited by pain c empty end feel 110 120  Shoulder extension   115, limited by pain      Shoulder abduction 165   115, limited by pain, empty end feel 130, limited by pain c empty end feel 90 90  Shoulder adduction          Shoulder internal rotation T7        Shoulder external rotation T6  45d Limited by pain 45d, not to exceed yet     Elbow flexion          Elbow extension          Wrist flexion          Wrist extension          Wrist ulnar deviation          Wrist radial deviation          Wrist pronation          Wrist supination          Empty end feel for PROM for L shoulder flexion, abd, and ER (Blank rows = not tested)   UPPER EXTREMITY MMT:                       L not tested due to protocol   PALPATION:  TTP of the upper trap and supraspinatus region   OBSERVATION: Steri-strips covering the incision             TODAY'S TREATMENT:  [facility] Adult PT Treatment:                                                DATE: 11/16/21 Therapeutic Exercise: Shoulder pulleys flexion and scaption 2 min each Shoulder flexion stretch on table top  x01 10" 60d doorway pec stretch x3 30" SL L sleeper IR stretch x3 30" Shoulder row 2x15 RTB Shoulder ext 2x15 RTB Modalities: Vaso x 15 mins minimal compression, 36dF, left shoulder  [facility] Adult PT Treatment:                                                DATE: 11/12/21 Therapeutic Exercise: Shoulder pulleys flexion and scaption 2 min each Eccentric bicep curl 2# 10 x 1 palm up, x 10 palm in Shoulder row 2x15 RTB Shoulder ext 2x15  RTB Shoulder ER 1x10 RTB Shoulder IR 1x10 RTB Supine shoulder flexion circles 2# -pain, better tolerated with 1# clockwise and counter clockwise Supine horizontal abduction red band x 15  Modalities: Vaso x 15 mins minimal compression, 36dF, left shoulder  [facility] Adult PT Treatment:                                                DATE: 11/04/21 Therapeutic Exercise: Shoulder pulleys flexion and scaption 2 min each Shoulder row 2x15 RTB Shoulder ext 2x15 RTB Shoulder ER 2x10 YTB Shoulder IR 2x10 YTB AAROM wall slides for flex and abd x5 Standing 90d circles c ball x20 each direction Modalities: Vaso x 15 mins minimal compression, 36dF, left shoulder    PATIENT EDUCATION: Education details: Eval findings, POC, HEP Person educated: Patient Education method: Explanation, Demonstration, Tactile cues, Verbal cues, and Handouts Education comprehension: verbalized understanding, returned demonstration, verbal cues required, and tactile cues required     HOME EXERCISE PROGRAM: Access Code: [email or web address] URL: [email or web address] Date: 10/14/2021 Prepared by: Gar Ponto  Exercises - Flexion-Extension Shoulder Pendulum with Table Support  - 2 x daily - 7 x weekly - 2 sets - 10 reps - Horizontal Shoulder Pendulum with Table Support  - 2 x daily - 7 x weekly - 2 sets - 10 reps - Seated Shoulder Flexion Slide at Table Top with Forearm in Neutral  - 2 x daily - 7 x weekly - 1 sets - 5 reps - 5 hold - Seated Shoulder Scaption Slide at Table Top with  Forearm in Neutral  - 2 x daily - 7 x weekly - 1 sets - 5 reps - 5 hold - Seated Shoulder External Rotation PROM on Table  - 2 x daily - 7 x weekly - 1 sets - 5 reps - 5 hold - Isometric Shoulder Flexion at Wall  - 2 x daily - 7 x weekly - 1 sets - 5 reps - Isometric Shoulder Extension at Wall  - 2 x daily - 7 x weekly - 1 sets - 5 reps - 5 hold - Isometric Shoulder Abduction at Wall  - 2 x daily - 7 x weekly - 1 sets - 5 reps - 5 hold - Standing Isometric Shoulder External Rotation with Doorway  - 2 x daily - 7 x weekly - 1 sets - 5 reps - 5 hold - Standing Isometric Shoulder Internal Rotation at Doorway  - 2 x daily - 7 x weekly - 1 sets - 5 reps - 5 hold - Standing Shoulder Row with Anchored Resistance  - 2 x daily - 7 x weekly - 2 sets - 10 reps - 3 hold - Shoulder extension with resistance - Neutral  - 2 x daily - 7 x weekly - 3 sets - 10 reps - 3 hold   ASSESSMENT:   CLINICAL IMPRESSION: Pt presents with increased L anterior shoulder pain today with pt not sure why it is hurting more today. Pt also reports intermittent L hand (4 fingers starting at the MCPs). Her pain is usually worse when she first wakes up in the AM. Pectoral stretching and GH IR stretching were completed today. Peri-scapular strengthening was also completed. Pt's use of the L arm is improving, however the L shoulder pain is still high. Vaso was completed at the end of session for pain management.  OBJECTIVE  IMPAIRMENTS decreased ROM, decreased strength, impaired UE functional use, postural dysfunction, and pain.    ACTIVITY LIMITATIONS carrying, lifting, sleeping, dressing, and reach over head   PARTICIPATION LIMITATIONS: meal prep, cleaning, laundry, driving, shopping, community activity, and yard work   PERSONAL FACTORS Past/current experiences, Time since onset of injury/illness/exacerbation, and 1 comorbidity: DM  are also affecting patient's functional outcome.    REHAB POTENTIAL: Good   CLINICAL DECISION  MAKING: Stable/uncomplicated   EVALUATION COMPLEXITY: Low     GOALS:   SHORT TERM GOALS: Target date: 10/12/2021  (Remove Blue Hyperlink)   Pt will be Ind in an initial HEP Baseline:started on eval Goal status: MET   2.  Increase L shoulder flexion PROM to 125d and abd to 135d to progress to proper R shoulder function Baseline: flex= 105d and abd= 115d Goal status: MET   LONG TERM GOALS: Target date: 11/19/21   Increase L shoulder strength to 4 or > for improved R shoulder function Baseline: NT Status: 11/01/21: grossly 3/5  Goal status: Improving   2.  Increase L shoulder AROM to at least 85% of the R shoulder to complete above shoulder reaching  Baseline: See flow sheet Status; 11/01/21: Flexion 120d, abd 90d Goal status: Improving   3. Decrease L shoulder pain to 3/10 or less for improved L UE function and QOL  Baseline: 4-10/10 Status 7/10 Goal status: No improvement   4.  Pt will complete L shoulder above shoulder reaching c min to zero scapular shrug Baseline:  Goal status: INITIAL   5.  Pt will score 27 or less with the UE Quickdash as indication of improved function Baseline: 53 Status:11/01/21: 43/55 Goal status: Improving    6.  Pt will be Ind in a final HEP to maintain achieved LOF and QOL Baselinestarted on eval  Goal status: INITIAL     PLAN: PT FREQUENCY: 1w2, 2w6   PT DURATION: 8 weeks   PLANNED INTERVENTIONS: Therapeutic exercises, Therapeutic activity, Neuromuscular re-education, Balance training, Gait training, Patient/Family education, Joint mobilization, Aquatic Therapy, Dry Needling, Electrical stimulation, Cryotherapy, Moist heat, Taping, Vasopneumatic device, Ultrasound, Ionotophoresis 60m/ml Dexamethasone, Manual therapy, and Re-evaluation   PLAN FOR NEXT SESSION: Assess response to HEP. Progress PROM and AAROM as tolerated. Initiate isometric strengthening exs.repeat vaso, consider estim. Reassess and re-cert for additional PT as  indicated.  Shanesha Bednarz MS, PT 11/16/21 12:56 PM

## 2021-11-18 ENCOUNTER — Other Ambulatory Visit: Payer: Self-pay | Admitting: Obstetrics and Gynecology

## 2021-11-18 NOTE — Patient Instructions (Signed)
Hey [name] [name]-have a nice afternoon!!  [name] [name] was given information about Medicaid Managed Care team care coordination services as a part of their [place] Medicaid benefit. [name] [name] verbally consented to engagement with the [facility] Managed Care team.   If [name] are experiencing a medical emergency, please call 911 or report to your local emergency department or urgent care.   If [name] have a non-emergency medical problem during routine business hours, please contact your provider's office and ask to speak with a nurse.   For questions related to your [facility], please call: [phone] or visit the homepage here: [email or web address]  If [name] would like to schedule transportation through your [facility], please call the following number at least 2 days in advance of your appointment: [phone]   Rides for urgent appointments can also be made after hours by calling Member Services.  Call the [place] at [phone], at any time, 24 hours a day, 7 days a week. If [name] are in danger or need immediate medical attention call 911.  If [name] would like help to quit smoking, call 1-800-QUIT-NOW 779-143-4183) OR Espaol: 1-855-Djelo-Ya ([phone]) o para ms informacin haga clic aqu or Text READY to 200-400 to register via text  [name] [name] - following are the goals we discussed in your visit today:   Goals Addressed             This Visit's Progress    Protect My Health       Timeframe:  Long-Range Goal Priority:  Medium Start Date:      05/07/20                       Expected End Date: ongoing             Follow Up Date: 12/28/21 - schedule appointment for vaccines needed due to my age or health - schedule recommended health tests (blood work, mammogram, colonoscopy, pap test) - schedule and  keep appointment for annual check-up    Why is this important?   Screening tests can find diseases early when they are easier to treat.  Your doctor or nurse will talk with [name] about which tests are important for [name].  Getting shots for common diseases like the flu and shingles will help prevent them.     11/18/21:  Patient seen by Dr. Buddy Duty on Monday, [facility]  today-continues  PT every week.  To see Dr. Marlou Sa 8/9.   Patient verbalizes understanding of instructions and care plan provided today and agrees to view in [place]. Active MyChart status and patient understanding of how to access instructions and care plan via MyChart confirmed with patient.     The Managed Medicaid care management team will reach out to the patient again over the next 30 business  days.  The  Patient  has been provided with contact information for the Managed Medicaid care management team and has been advised to call with any health related questions or concerns.   Aida Raider RN, BSN [place] Management Coordinator - Managed Medicaid High Risk [phone]   Following is a copy of your plan of care:  Care Plan : General Plan of Care (Adult)  Updates made by Gayla Medicus, RN since 11/18/2021 12:00 AM     Problem: Health Promotion or Disease Self-Management (General Plan of  Care)   Priority: Medium  Onset Date: 08/10/2020     Long-Range Goal: Self-Management Plan Developed   Start Date: 05/07/2020  Expected End Date: 02/18/2022  Recent Progress: On track  Priority: Medium  Note:   Current Barriers:  Chronic Disease Management support and education needs.  11/18/21:  Patient without complaint today, [employer] providing transportation without problems now.  Seen and evaluated by Dr. Buddy Duty and [facility]-to hopefully get a hearing aid soon.  Continues PT once a week s/p shoulder surgery in May-appt with Dr. Marlou Sa 12/01/21.  Nurse Case Manager Clinical Goal(s):   Over the next 30 days, patient will attend all scheduled medical appointments  Interventions:  Inter-disciplinary care team collaboration (see longitudinal plan of care) Evaluation of current treatment plan and patient's adherence to plan as established by provider. Reviewed medications with patient. Collaborated with pharmacy regarding medications. Discussed plans with patient for ongoing care management follow up and provided patient with direct contact information for care management team Reviewed scheduled/upcoming provider appointments. Pharmacy referral for medication review-completed. Patient provided with [facility] transportation information.  Patient Goals/Self-Care Activities Over the next 30 days, patient will:  -Attends all scheduled provider appointments Calls pharmacy for medication refills Calls provider office for new concerns or questions  Follow Up Plan: The Managed Medicaid care management team will reach out to the patient again over the next 30 business  days.  The patient has been provided with contact information for the Managed Medicaid care management team and has been advised to call with any health related questions or concerns.

## 2021-11-18 NOTE — Patient Outreach (Signed)
Medicaid Managed Care   Nurse Care Manager Note  11/18/2021 Name:  [name] [name] MRN:  [id number] DOB:  Mar 04, 1961  [name] [name] is an 61 y.o. year old female who is a primary patient of [name] Dame, MD.  The [facility] Managed Care Coordination team was consulted for assistance with:    Chronic healthcare management needs, DM, HTN, HIV, asthma, GERD, anxiety, osteoarthritis, headaches, HLD  [name] was given information about Medicaid Managed Care Coordination team services today. [name] [name] Patient agreed to services and verbal consent obtained.  Engaged with patient by telephone for follow up visit in response to provider referral for case management and/or care coordination services.   Assessments/Interventions:  Review of past medical history, allergies, medications, health status, including review of consultants reports, laboratory and other test data, was performed as part of comprehensive evaluation and provision of chronic care management services.  SDOH (Social Determinants of Health) assessments and interventions performed: SDOH Interventions    Flowsheet Row Most Recent Value  SDOH Interventions   Stress Interventions Intervention Not Indicated     Care Plan  Allergies  Allergen Reactions   Acetaminophen Other (See Comments)    Inflamed liver, hospitalized     Morphine Sulfate Hives and Shortness Of Breath   Triamterene Hives   Aspirin-Caffeine Other (See Comments)    liver damage; upset stomach   Dyazide [Hydrochlorothiazide W-Triamterene] Hives   Jardiance [Empagliflozin] Diarrhea    (Jardiance) stomach ache    Lisinopril     Cough    Metformin Hcl Er Other (See Comments)     upset stomach    Topamax [Topiramate] Other (See Comments)    Vision disturbances.   Citalopram Itching and Rash   Emtricitabine-Tenofovir Df Rash     Descovy   Losartan Nausea Only and Rash    Pt had rash, worsening dizziness, and nausea after starting losartan, which  improved after stopping losartan   Triamterene-Hctz Rash   Medications Reviewed Today     Reviewed by [name] Medicus, RN (Registered Nurse) on 11/18/21 at [place] List Status: <None>   Medication Order Taking? Sig Documenting Provider Last Dose Status Informant  albuterol (VENTOLIN HFA) 108 (90 Base) MCG/ACT inhaler [id number]  Inhale 2 puffs into the lungs every 4 (four) hours as needed for wheezing or shortness of breath. Kozlow, Donnamarie Poag, MD  Active   amLODipine (NORVASC) 10 MG tablet [id number] No Take 1 tablet (10 mg total) by mouth daily. [name] Dame, MD Taking Active Self  atorvastatin (LIPITOR) 10 MG tablet [id number] No TAKE HALF TABLET BY MOUTH DAILY Katsadouros, Vasilios, MD Taking Active Self  bictegravir-emtricitabine-tenofovir AF (BIKTARVY) 50-200-25 MG TABS tablet [id number] No Take 1 tablet by mouth daily. [place] Callas, NP Taking Active Self  buPROPion (WELLBUTRIN XL) 300 MG 24 hr tablet [id number] No TAKE ONE TABLET BY MOUTH DAILY  Patient taking differently: Take 300 mg by mouth every evening.   [name] Dame, MD Taking Active Self  cetirizine (ZYRTEC) 10 MG tablet [id number]  Take 2 tablets (20 mg total) by mouth 2 (two) times daily as needed for allergies. Kozlow, Donnamarie Poag, MD  Active   chlorhexidine (PERIDEX) 0.12 % solution [id number] No Use as directed 15 mLs in the mouth or throat 2 (two) times daily. [place] Callas, NP Taking Active Self  cholecalciferol (VITAMIN D) 25 MCG (1000 UNIT) tablet [id number]  Take 1 tablet (1,000 Units total) by mouth daily. [name] Dame, MD  Active   cyclobenzaprine [facility])  10 MG tablet [id number] No Take 1 tablet (10 mg total) by mouth 3 (three) times daily as needed for muscle spasms. Magnant, Gerrianne Scale, PA-C Taking Active Self  diclofenac (VOLTAREN) 75 MG EC tablet [id number]  TAKE 1 TABLET (75 MG TOTAL) BY MOUTH TWO (TWO) TIMES DAILY. DO NOT TAKE WITH OTHER NSAIDS SUCH AS IBUprofen Magnant, Gerrianne Scale, PA-C  Active    diphenoxylate-atropine (LOMOTIL) 2.5-0.025 MG tablet [id number] No Take 1 tablet by mouth 4 (four) times daily as needed for diarrhea or loose stools. [place] Callas, NP Taking Active Self  famotidine (PEPCID) 40 MG tablet [id number]  Take 1 tablet (40 mg total) by mouth at bedtime. Kozlow, Donnamarie Poag, MD  Active   gabapentin (NEURONTIN) 300 MG capsule [id number]  Take 1 capsule (300 mg total) by mouth 2 (two) times daily. Meredith Pel, MD  Active   hydrocortisone (CORTEF) 5 MG tablet [id number] No Take 2.5-5 mg by mouth See admin instructions. Take 1 tablet (5 mg) by mouth in the morning (scheduled) & may taken an additional 0.5 tablet (2.5 mg) by mouth in the evening if needed. [provider] Taking Active Self  liraglutide (VICTOZA) 18 MG/3ML SOPN [id number] No Inject 1.2 mg into the skin every evening. [provider] Taking Active Self  methocarbamol (ROBAXIN) 500 MG tablet [id number]  Take 1 tablet (500 mg total) by mouth every 8 (eight) hours as needed for muscle spasms. Meredith Pel, MD  Active   metoprolol tartrate (LOPRESSOR) 25 MG tablet [id number] No Take 1 tablet (25 mg total) by mouth 2 (two) times daily. [name] Dame, MD Taking Active Self  montelukast (SINGULAIR) 10 MG tablet [id number]  Take 1 tablet (10 mg total) by mouth at bedtime. Jiles Prows, MD  Active   NEURONTIN 100 MG capsule [id number]  Take 2 capsules (200 mg total) by mouth 3 (three) times daily. Kozlow, Donnamarie Poag, MD  Active   nitroGLYCERIN (NITROSTAT) 0.4 MG SL tablet [id number] No Place 1 tablet (0.4 mg total) under the tongue every 5 (five) minutes as needed for chest pain. Isaiah Serge, NP Taking Active Self  nortriptyline (PAMELOR) 10 MG capsule [id number] No Take 2 capsules (20 mg total) by mouth at bedtime. Marcial Pacas, MD Taking Active Self  pantoprazole (PROTONIX) 40 MG tablet [id number]  Take 1 tablet (40 mg total) by mouth 2 (two) times daily. Kozlow, Donnamarie Poag, MD  Active   potassium  chloride SA (KLOR-CON) 20 MEQ tablet [id number] No Take 20 mEq by mouth in the morning. [provider] Taking Active Self  RESTASIS 0.05 % ophthalmic emulsion [id number] No Place 1 drop into both eyes 2 (two) times daily. [provider] Taking Active Self  sodium chloride (OCEAN) 0.65 % SOLN nasal spray [id number] No PLACE 1 SPRAY INTO BOTH NOSTRILS AS NEEDED FOR CONGESTION. [name], DO Taking Active Self  SUMAtriptan (IMITREX) 25 MG tablet [id number] No Take 1 tablet (25 mg total) by mouth every 2 (two) hours as needed for migraine. May repeat in 2 hours if headache persists or recurs. Marcial Pacas, MD Taking Active Self  SYMBICORT 160-4.5 MCG/ACT inhaler [id number]  Inhale 2 puffs into the lungs in the morning and at bedtime. Kozlow, Donnamarie Poag, MD  Active   traMADol Veatrice [place]) 50 MG tablet [id number] No Take 2 tablets (100 mg total) by mouth every 6 (six) hours as needed. Magnant, Gerrianne Scale, PA-C Taking Active   triamcinolone (NASACORT) 55 MCG/ACT AERO nasal inhaler [id number]  Place 1  spray into the nose 2 (two) times daily. 1 spray each nostril 2 times per day Jiles Prows, MD  Active            Patient Active Problem List   Diagnosis Date Noted   Synovitis of left shoulder    Complete tear of left rotator cuff    Acute otitis media with perforated tympanic membrane, right 08/03/2021   Impingement syndrome of left shoulder 07/01/2021   Left leg swelling 07/09/2020   Grief reaction 06/08/2020   Arm pain 02/10/2020   Dizziness on standing 12/27/2019   Generalized anxiety disorder with panic attacks 06/20/2019   Osteoarthritis of thumb, right 11/29/2018   Chronic migraine w/o aura w/o status migrainosus, not intractable 08/27/2018   Disorder of left eustachian tube 05/19/2017   Vertigo of central origin 01/23/2017   Central perforation of tympanic membrane of left ear 12/09/2016   Eustachian tube dysfunction, bilateral 06/15/2016   Ear pain, right 06/15/2016    Spondylosis of lumbar region without myelopathy or radiculopathy 10/08/2015   BPPV (benign paroxysmal positional vertigo) 10/02/2015   Liver fibrosis ([place]) 12/04/2014   Pituitary microadenoma (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   Vitamin D deficiency 06/29/2014   Secondary adrenal insufficiency ([place]) 06/29/2014   History of tympanostomy tube placement 02/07/2014   Refusal of blood transfusions as patient is Jehovah's Witness 11/18/2013   Hepatitis C virus infection cured after antiviral drug therapy 03/16/2012   Health care maintenance 12/15/2011   Opioid dependence ([place]) 10/05/2010   HTN (hypertension) 10/05/2010   Hyperlipidemia associated with type 2 diabetes mellitus ([place]) 10/23/2008   HIV disease ([place]) 05/09/2006   Depression 05/09/2006   Allergic rhinitis 05/09/2006   GERD 05/09/2006   Conditions to be addressed/monitored per PCP order:  Chronic healthcare management needs, DM, HTN, HIV, asthma, GERD, anxiety, osteoarthritis, headaches, HLD  Care Plan : General Plan of Care (Adult)  Updates made by [name] Medicus, RN since 11/18/2021 12:00 AM     Problem: Health Promotion or Disease Self-Management (General Plan of Care)   Priority: Medium  Onset Date: 08/10/2020     Long-Range Goal: Self-Management Plan Developed   Start Date: 05/07/2020  Expected End Date: 02/18/2022  Recent Progress: On track  Priority: Medium  Note:   Current Barriers:  Chronic Disease Management support and education needs.  11/18/21:  Patient without complaint today, [employer] providing transportation without problems now.  Seen and evaluated by Dr. Buddy Duty and [facility]-to hopefully get a hearing aid soon.  Continues PT once a week s/p shoulder surgery in May-appt with Dr. Marlou Sa 12/01/21.  Nurse Case Manager Clinical Goal(s):  Over the next 30 days, patient will attend all scheduled medical appointments  Interventions:  Inter-disciplinary care team collaboration  (see longitudinal plan of care) Evaluation of current treatment plan and patient's adherence to plan as established by provider. Reviewed medications with patient. Collaborated with pharmacy regarding medications. Discussed plans with patient for ongoing care management follow up and provided patient with direct contact information for care management team Reviewed scheduled/upcoming provider appointments. Pharmacy referral for medication review-completed. Patient provided with [facility] transportation information.  Patient Goals/Self-Care Activities Over the next 30 days, patient will:  -Attends all scheduled provider appointments Calls pharmacy for medication refills Calls provider office for new concerns or questions  Follow Up Plan: The Managed Medicaid care management team will reach out to the patient again over the next 30 business  days.  The patient has been provided  with contact information for the Managed Medicaid care management team and has been advised to call with any health related questions or concerns.    Follow Up:  Patient agrees to Care Plan and Follow-up.  Plan: The Managed Medicaid care management team will reach out to the patient again over the next 30 business  days. and The  Patient has been provided with contact information for the Managed Medicaid care management team and has been advised to call with any health related questions or concerns.  Date/time of next scheduled RN care management/care coordination outreach: 12/28/21 at 1030.

## 2021-11-19 ENCOUNTER — Ambulatory Visit: Payer: Medicaid Other

## 2021-11-19 DIAGNOSIS — M25612 Stiffness of left shoulder, not elsewhere classified: Secondary | ICD-10-CM

## 2021-11-19 DIAGNOSIS — M6281 Muscle weakness (generalized): Secondary | ICD-10-CM

## 2021-11-19 DIAGNOSIS — M25512 Pain in left shoulder: Secondary | ICD-10-CM | POA: Diagnosis not present

## 2021-11-19 DIAGNOSIS — G8929 Other chronic pain: Secondary | ICD-10-CM

## 2021-11-19 NOTE — Therapy (Signed)
OUTPATIENT PHYSICAL THERAPY TREATMENT NOTE/Re-Cert   Patient Name: [name] [name] MRN: [id number] DOB:Nov 23, 1960, 61 y.o., female Today's Date: 11/19/2021  PCP: [name], MD  REFERRING PROVIDER: Meredith Pel, MD         END OF SESSION:   PT End of Session - 11/19/21 1025     Visit Number 13    Number of Visits 21    Date for PT Re-Evaluation 01/21/22    Authorization Type [place] MEDICAID [place] - Number of Visits 27    Progress Note Due on Visit 20    PT Start Time 1021    PT Stop Time 1110    PT Time Calculation (min) 49 min    Activity Tolerance Patient limited by pain;Patient tolerated treatment well    Behavior During Therapy [facility] for tasks assessed/performed                      Past Medical History:  Diagnosis Date   Allergic rhinitis 05/09/2006   Allergy    Anxiety    Arthritis    Asthma    CHF (congestive heart failure) (HCC)    Chronic back pain    Diabetes mellitus without complication (HCC)    GERD (gastroesophageal reflux disease)    Heart murmur    as a child   Hepatitis C    genotype 1b, 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 s/p 12 week course of simeprevir and sofosbuvir between October 2014 and January 2015 with resolution.   History of shingles    HIV infection ([place])    1994   Hyperlipidemia    no meds taken now   Hypertension    Migraine    Pituitary microadenoma ([place]) 08/08/2014   Pneumonia    Prediabetes    Refusal of blood transfusions as patient is Jehovah's Witness    Secondary adrenal insufficiency ([place]) 06/29/2014   Urticaria    Past Surgical History:  Procedure Laterality Date   BUNIONECTOMY     b/l   COLECTOMY     2003 for diverticulitis, had colostomy bag and then reversed   COLONOSCOPY     HAND SURGERY     HAND SURGERY Right    right thumb surgery Dr Burney Gauze 2021   NASAL SINUS SURGERY     SHOULDER ARTHROSCOPY WITH OPEN ROTATOR CUFF REPAIR AND  DISTAL CLAVICLE ACROMINECTOMY Left 08/31/2021   Procedure: LEFT SHOULDER ARTHROSCOPY, DEBRIDEMENT,  ROTATOR CUFF TEAR REPAIR;  Surgeon: Meredith Pel, MD;  Location: [facility];  Service: Orthopedics;  Laterality: Left;   SHOULDER SURGERY     left   TONSILLECTOMY     Patient Active Problem List   Diagnosis Date Noted   Synovitis of left shoulder    Complete tear of left rotator cuff    Acute otitis media with perforated tympanic membrane, right 08/03/2021   Impingement syndrome of left shoulder 07/01/2021   Left leg swelling 07/09/2020   Grief reaction 06/08/2020   Arm pain 02/10/2020   Dizziness on standing 12/27/2019   Generalized anxiety disorder with panic attacks 06/20/2019   Osteoarthritis of thumb, right 11/29/2018   Chronic migraine w/o aura w/o status migrainosus, not intractable 08/27/2018   Disorder of left eustachian tube 05/19/2017   Vertigo of central origin 01/23/2017   Central perforation of tympanic membrane of left ear 12/09/2016   Eustachian tube dysfunction, bilateral 06/15/2016   Ear pain, right 06/15/2016   Spondylosis of lumbar  region without myelopathy or radiculopathy 10/08/2015   BPPV (benign paroxysmal positional vertigo) 10/02/2015   Liver fibrosis ([place]) 12/04/2014   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   Vitamin D deficiency 06/29/2014   Secondary adrenal insufficiency ([place]) 06/29/2014   History of tympanostomy tube placement 02/07/2014   Refusal of blood transfusions as patient is Jehovah's Witness 11/18/2013   Hepatitis C virus infection cured after antiviral drug therapy 03/16/2012   Health care maintenance 12/15/2011   Opioid dependence ([place]) 10/05/2010   HTN (hypertension) 10/05/2010   Hyperlipidemia associated with type 2 diabetes mellitus ([place]) 10/23/2008   HIV disease ([place]) 05/09/2006   Depression 05/09/2006   Allergic rhinitis 05/09/2006   GERD 05/09/2006    REFERRING DIAG: M79.602  (ICD-10-CM) - Left arm pain.  Left shoulder arthroscopy with debridement and rotator cuff tear repair.  THERAPY DIAG:  Chronic left shoulder pain  Muscle weakness (generalized)  Stiffness of left shoulder, not elsewhere classified  Rationale for Evaluation and Treatment Rehabilitation  Onset: 08/31/21   SUBJECTIVE:      Pt reports her L shoulder is hurting more today and she is not sure why. She also reports intermittent numbness of her L hand.                                                                                                                                                              NPRS: 0/10, 8/10 with reaching  Pain location: L shoulder  Pain description: ache , throb, sharp Aggravating factors: L shoulder movement Relieving factors: Ice man, rest, medications Pain range 7-8/10   PRECAUTIONS: Other: per Dr. Randel Pigg visit note, pt may DC sling with start of PT. Pt is not to completing lifting of the L arm  From: Donella Stade, PA-C  Should be good for full active range of motion and early rotator cuff strengthening exercises starting on 10/13/2021 which will be about 6 weeks out from surgery.     PERTINENT HISTORY: left shoulder arthroscopy debridement rotator cuff tear repair 08/31/21, arthritis, anxiety, CHF, DM   WEIGHT BEARING RESTRICTIONS No   FALLS:  Has patient fallen in last 6 months? No   LIVING ENVIRONMENT: Lives with: lives with their family and lives alone Lives in: House/apartment Pt is able to access and be mobile with in her home   OCCUPATION: Disabled   PLOF: Independent   PATIENT GOALS Use it with less pain, better use with cooking and cleaning   OBJECTIVE: (objective measures completed at initial evaluation unless otherwise dated)   DIAGNOSTIC FINDINGS:  L shoulder MRI 05/25/21 IMPRESSION: 1. Chronic calcific tendinosis of the posterior supraspinatus with moderate intermediate T2 signal and thickening tendinosis of the mid to  posterior supraspinatus and anterior infraspinatus. 2. Additional likely chronic calcific  tendinosis of the superior subscapularis tendon along with a moderate partial-thickness tear. 3. Status post distal clavicle excision and acromioplasty. 4. Mild subacromial/subdeltoid bursitis. 5. Mild-to-moderate glenohumeral cartilage degenerative changes   PATIENT SURVEYS:  Quick Dash 53/55. 10/20/21=43/55   COGNITION:           Overall cognitive status: Within functional limits for tasks assessed                                  SENSATION: WFL   POSTURE: Forward head c CT step off and dorsal hump, increased kyphosis, rounded shoulders,    UPPER EXTREMITY ROM:    Passive ROM Right 09/21/2021 Left 09/21/2021 LT 10/01/21 LT 10/11/21 LT AROM  10/25/21 LT AROM 11/02/21  Shoulder flexion 160 105 d, limited by pain 125, limited by pain, empty endfeel 140, limited by pain c empty end feel 110 120  Shoulder extension   115, limited by pain      Shoulder abduction 165   115, limited by pain, empty end feel 130, limited by pain c empty end feel 90 90  Shoulder adduction          Shoulder internal rotation T7        Shoulder external rotation T6  45d Limited by pain 45d, not to exceed yet     Elbow flexion          Elbow extension          Wrist flexion          Wrist extension          Wrist ulnar deviation          Wrist radial deviation          Wrist pronation          Wrist supination          Empty end feel for PROM for L shoulder flexion, abd, and ER (Blank rows = not tested)  Passive ROM Right 10/2821       Shoulder flexion AROM 130       Shoulder extension         Shoulder abduction AROM 90, limited by pin and weakness        Shoulder adduction          Shoulder internal rotation PROM 45d, limited by pain        Shoulder external rotation PROM, limited by pain       Elbow flexion          Elbow extension          Wrist flexion          Wrist extension          Wrist ulnar  deviation          Wrist radial deviation          Wrist pronation          Wrist supination              UPPER EXTREMITY MMT:                       L not tested due to protocol   PALPATION:  TTP of the upper trap and supraspinatus region   OBSERVATION: Steri-strips covering the incision             TODAY'S TREATMENT:  [facility] Adult PT Treatment:  DATE: 11/19/21 Therapeutic Exercise: Shoulder pulleys flexion and scaption 2 min each SL L sleeper IR stretch x3 30" Bilat shoulder flexion and scaption to 90d x10 each no weight L shoulder ER 2x10 Bilat Shoulder row 2x15 RTB Bilat Shoulder ext 2x15 RTB Updated HEP Modalities: Vaso x 15 mins minimal compression, 36dF, left shoulder  [facility] Adult PT Treatment:                                                DATE: 11/16/21 Therapeutic Exercise: Shoulder pulleys flexion and scaption 2 min each Shoulder flexion stretch on table top x01 10" 60d doorway pec stretch x3 30" SL L sleeper IR stretch x3 30" Shoulder row 2x15 RTB Shoulder ext 2x15 RTB Modalities: Vaso x 15 mins minimal compression, 36dF, left shoulder  [facility] Adult PT Treatment:                                                DATE: 11/12/21 Therapeutic Exercise: Shoulder pulleys flexion and scaption 2 min each Eccentric bicep curl 2# 10 x 1 palm up, x 10 palm in Shoulder row 2x15 RTB Shoulder ext 2x15 RTB Shoulder ER 1x10 RTB Shoulder IR 1x10 RTB Supine shoulder flexion circles 2# -pain, better tolerated with 1# clockwise and counter clockwise Supine horizontal abduction red band x 15  Modalities: Vaso x 15 mins minimal compression, 36dF, left shoulder   PATIENT EDUCATION: Education details: Eval findings, POC, HEP Person educated: Patient Education method: Explanation, Demonstration, Tactile cues, Verbal cues, and Handouts Education comprehension: verbalized understanding, returned demonstration, verbal cues required, and  tactile cues required     HOME EXERCISE PROGRAM: Access Code: [email or web address] URL: [email or web address] Date: 11/19/2021 Prepared by: Gar Ponto  Exercises - Flexion-Extension Shoulder Pendulum with Table Support  - 2 x daily - 7 x weekly - 2 sets - 10 reps - Horizontal Shoulder Pendulum with Table Support  - 2 x daily - 7 x weekly - 2 sets - 10 reps - Seated Shoulder Flexion Slide at Table Top with Forearm in Neutral  - 2 x daily - 7 x weekly - 1 sets - 5 reps - 5 hold - Seated Shoulder Scaption Slide at Table Top with Forearm in Neutral  - 2 x daily - 7 x weekly - 1 sets - 5 reps - 5 hold - Seated Shoulder External Rotation PROM on Table  - 2 x daily - 7 x weekly - 1 sets - 5 reps - 5 hold - Isometric Shoulder Flexion at Wall  - 2 x daily - 7 x weekly - 1 sets - 5 reps - Isometric Shoulder Extension at Wall  - 2 x daily - 7 x weekly - 1 sets - 5 reps - 5 hold - Isometric Shoulder Abduction at Wall  - 2 x daily - 7 x weekly - 1 sets - 5 reps - 5 hold - Standing Isometric Shoulder External Rotation with Doorway  - 2 x daily - 7 x weekly - 1 sets - 5 reps - 5 hold - Standing Isometric Shoulder Internal Rotation at Doorway  - 2 x daily - 7 x weekly - 1 sets - 5 reps - 5 hold - Standing Shoulder Row with Anchored Resistance  -  2 x daily - 7 x weekly - 2 sets - 10 reps - 3 hold - Shoulder extension with resistance - Neutral  - 2 x daily - 7 x weekly - 3 sets - 10 reps - 3 hold - Shoulder External Rotation with Anchored Resistance  - 1 x daily - 7 x weekly - 2 sets - 10 reps - 3 hold - Shoulder Internal Rotation with Resistance (Mirrored)  - 1 x daily - 7 x weekly - 2 sets - 10 reps - 3 hold - Sleeper Stretch  - 1 x daily - 7 x weekly - 1 sets - 3 reps - 30 hold    ASSESSMENT:   CLINICAL IMPRESSION: PT program was progressed to address L shoulder ER and IR ROM and for isotonic strengthening. Pt is making progress completing L shoulder flexion s shoulder shrug and with this motion  improving to 130d. Abd is sill limited to 90d by pain. IR ROM is limited by pain and tightness. Functional use of the R shoulder is improving, although pain is a limiting factor. Pt will continue to benefit from skilled PT 1w8 to address pain, ROM and strength to otpimize L shoulder function.   OBJECTIVE IMPAIRMENTS decreased ROM, decreased strength, impaired UE functional use, postural dysfunction, and pain.    ACTIVITY LIMITATIONS carrying, lifting, sleeping, dressing, and reach over head   PARTICIPATION LIMITATIONS: meal prep, cleaning, laundry, driving, shopping, community activity, and yard work   PERSONAL FACTORS Past/current experiences, Time since onset of injury/illness/exacerbation, and 1 comorbidity: DM  are also affecting patient's functional outcome.    REHAB POTENTIAL: Good   CLINICAL DECISION MAKING: Stable/uncomplicated   EVALUATION COMPLEXITY: Low     GOALS:   SHORT TERM GOALS: Target date: 10/12/2021  (Remove Blue Hyperlink)   Pt will be Ind in an initial HEP Baseline:started on eval Goal status: MET   2.  Increase L shoulder flexion PROM to 125d and abd to 135d to progress to proper R shoulder function Baseline: flex= 105d and abd= 115d Goal status: MET   LONG TERM GOALS: Target date: 01/21/22   Increase L shoulder strength to 4 or > for improved R shoulder function Baseline: NT Status: 11/01/21: grossly 3/5  Goal status: Improving   2.  Increase L shoulder AROM to at least 85% of the R shoulder to complete above shoulder reaching  Baseline: See flow sheet Status; 11/18/21: Flexion 130d, abd 90d Goal status: Improving   3. Decrease L shoulder pain to 3/10 or less for improved L UE function and QOL  Baseline: 4-10/10 Status 8/10 Goal status: No improvement   4.  Pt will complete L shoulder above shoulder reaching c min to zero scapular shrug Baseline: unable Goal status: MET   5.  Pt will score 27 or less with the UE Quickdash as indication of improved  function Baseline: 53 Status:11/01/21: 43/55 Goal status: Improving    6.  Pt will be Ind in a final HEP to maintain achieved LOF and QOL Baselinestarted on eval  Goal status: INITIAL     PLAN: PT FREQUENCY: 1w8   PT DURATION: 8 weeks   PLANNED INTERVENTIONS: Therapeutic exercises, Therapeutic activity, Neuromuscular re-education, Balance training, Gait training, Patient/Family education, Joint mobilization, Aquatic Therapy, Dry Needling, Electrical stimulation, Cryotherapy, Moist heat, Taping, Vasopneumatic device, Ultrasound, Ionotophoresis 42m/ml Dexamethasone, Manual therapy, and Re-evaluation   PLAN FOR NEXT SESSION: Assess response to HEP. Progress PROM and AAROM as tolerated. Initiate isometric strengthening exs.repeat vaso, consider estim. Reassess and  re-cert for additional PT as indicated.  Miu Chiong MS, PT 11/19/21 11:41 AM

## 2021-11-22 ENCOUNTER — Other Ambulatory Visit: Payer: Self-pay | Admitting: Allergy and Immunology

## 2021-11-22 ENCOUNTER — Encounter: Payer: Medicaid Other | Admitting: Physical Therapy

## 2021-11-24 ENCOUNTER — Ambulatory Visit: Payer: Medicaid Other | Attending: Orthopedic Surgery | Admitting: Physical Therapy

## 2021-11-24 ENCOUNTER — Encounter: Payer: Self-pay | Admitting: Physical Therapy

## 2021-11-24 DIAGNOSIS — M6281 Muscle weakness (generalized): Secondary | ICD-10-CM | POA: Diagnosis present

## 2021-11-24 DIAGNOSIS — M25612 Stiffness of left shoulder, not elsewhere classified: Secondary | ICD-10-CM | POA: Insufficient documentation

## 2021-11-24 DIAGNOSIS — G8929 Other chronic pain: Secondary | ICD-10-CM | POA: Insufficient documentation

## 2021-11-24 DIAGNOSIS — M25512 Pain in left shoulder: Secondary | ICD-10-CM | POA: Insufficient documentation

## 2021-11-24 NOTE — Therapy (Addendum)
OUTPATIENT PHYSICAL THERAPY TREATMENT NOTE/Re-Cert   Patient Name: [name] [name] MRN: [id number] DOB:[birth year], 61 y.o., female Today's Date: 11/24/2021  PCP: [name], MD  REFERRING PROVIDER: Meredith Pel, MD         END OF SESSION:   PT End of Session - 11/24/21 1014     Visit Number 14    Number of Visits 21    Date for PT Re-Evaluation 01/21/22    Authorization Type [place] MEDICAID [place] - Number of Visits 27    Progress Note Due on Visit 20    PT Start Time 1017    PT Stop Time 1100    PT Time Calculation (min) 43 min                      Past Medical History:  Diagnosis Date   Allergic rhinitis 05/09/2006   Allergy    Anxiety    Arthritis    Asthma    CHF (congestive heart failure) (HCC)    Chronic back pain    Diabetes mellitus without complication (HCC)    GERD (gastroesophageal reflux disease)    Heart murmur    as a child   Hepatitis C    genotype 1b, 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 s/p 12 week course of simeprevir and sofosbuvir between October 2014 and January 2015 with resolution.   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   Pneumonia    Prediabetes    Refusal of blood transfusions as patient is Jehovah's Witness    Secondary adrenal insufficiency ([place]) 06/29/2014   Urticaria    Past Surgical History:  Procedure Laterality Date   BUNIONECTOMY     b/l   COLECTOMY     2003 for diverticulitis, had colostomy bag and then reversed   COLONOSCOPY     HAND SURGERY     HAND SURGERY Right    right thumb surgery Dr Burney Gauze 2021   NASAL SINUS SURGERY     SHOULDER ARTHROSCOPY WITH OPEN ROTATOR CUFF REPAIR AND DISTAL CLAVICLE ACROMINECTOMY Left 08/31/2021   Procedure: LEFT SHOULDER ARTHROSCOPY, DEBRIDEMENT,  ROTATOR CUFF TEAR REPAIR;  Surgeon: Meredith Pel, MD;  Location: [facility];  Service: Orthopedics;  Laterality: Left;   SHOULDER SURGERY     left   TONSILLECTOMY     Patient Active Problem List   Diagnosis Date Noted   Synovitis of left shoulder    Complete tear of left rotator cuff    Acute otitis media with perforated tympanic membrane, right 08/03/2021   Impingement syndrome of left shoulder 07/01/2021   Left leg swelling 07/09/2020   Grief reaction 06/08/2020   Arm pain 02/10/2020   Dizziness on standing 12/27/2019   Generalized anxiety disorder with panic attacks 06/20/2019   Osteoarthritis of thumb, right 11/29/2018   Chronic migraine w/o aura w/o status migrainosus, not intractable 08/27/2018   Disorder of left eustachian tube 05/19/2017   Vertigo of central origin 01/23/2017   Central perforation of tympanic membrane of left ear 12/09/2016   Eustachian tube dysfunction, bilateral 06/15/2016   Ear pain, right 06/15/2016   Spondylosis of lumbar region without myelopathy or radiculopathy 10/08/2015   BPPV (benign paroxysmal positional vertigo) 10/02/2015   Liver fibrosis ([place]) 12/04/2014  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   Vitamin D deficiency 06/29/2014   Secondary adrenal insufficiency ([place]) 06/29/2014   History of tympanostomy tube placement 02/07/2014   Refusal of blood transfusions as patient is Jehovah's Witness 11/18/2013   Hepatitis C virus infection cured after antiviral drug therapy 03/16/2012   Health care maintenance 12/15/2011   Opioid dependence ([place]) 10/05/2010   HTN (hypertension) 10/05/2010   Hyperlipidemia associated with type 2 diabetes mellitus ([place]) 10/23/2008   HIV disease ([place]) 05/09/2006   Depression 05/09/2006   Allergic rhinitis 05/09/2006   GERD 05/09/2006    REFERRING DIAG: M79.602 (ICD-10-CM) - Left arm pain.  Left shoulder arthroscopy with debridement and rotator cuff tear repair.  THERAPY DIAG:  Chronic left  shoulder pain  Muscle weakness (generalized)  Stiffness of left shoulder, not elsewhere classified  Rationale for Evaluation and Treatment Rehabilitation  Onset: 08/31/21   SUBJECTIVE:      Pt reports her L shoulder hurts more in the mornings. She has a MD F/U 11/30/21.                                                                                                                                                              NPRS: 0/10, 8/10 with reaching  Pain location: L shoulder  Pain description: ache , throb, sharp Aggravating factors: L shoulder movement Relieving factors: Ice man, rest, medications Pain range 7-8/10   PRECAUTIONS: Other: per Dr. Randel Pigg visit note, pt may DC sling with start of PT. Pt is not to completing lifting of the L arm  From: Donella Stade, PA-C  Should be good for full active range of motion and early rotator cuff strengthening exercises starting on 10/13/2021 which will be about 6 weeks out from surgery.     PERTINENT HISTORY: left shoulder arthroscopy debridement rotator cuff tear repair 08/31/21, arthritis, anxiety, CHF, DM   WEIGHT BEARING RESTRICTIONS No   FALLS:  Has patient fallen in last 6 months? No   LIVING ENVIRONMENT: Lives with: lives with their family and lives alone Lives in: House/apartment Pt is able to access and be mobile with in her home   OCCUPATION: Disabled   PLOF: Independent   PATIENT GOALS Use it with less pain, better use with cooking and cleaning   OBJECTIVE: (objective measures completed at initial evaluation unless otherwise dated)   DIAGNOSTIC FINDINGS:  L shoulder MRI 05/25/21 IMPRESSION: 1. Chronic calcific tendinosis of the posterior supraspinatus with moderate intermediate T2 signal and thickening tendinosis of the mid to posterior supraspinatus and anterior infraspinatus. 2. Additional likely chronic calcific tendinosis of the superior subscapularis tendon along with a moderate partial-thickness  tear. 3. Status post distal clavicle excision and acromioplasty. 4. Mild subacromial/subdeltoid bursitis. 5. Mild-to-moderate glenohumeral cartilage degenerative changes  PATIENT SURVEYS:  Quick Dash 53/55. 10/20/21=43/55   COGNITION:           Overall cognitive status: Within functional limits for tasks assessed                                  SENSATION: WFL   POSTURE: Forward head c CT step off and dorsal hump, increased kyphosis, rounded shoulders,    UPPER EXTREMITY ROM:    Passive ROM Right 09/21/2021 Left 09/21/2021 LT 10/01/21 LT 10/11/21 LT AROM  10/25/21 LT AROM 11/02/21 LT 11/24/21  Shoulder flexion 160 105 d, limited by pain 125, limited by pain, empty endfeel 140, limited by pain c empty end feel 110 120 130  Shoulder extension   115, limited by pain       Shoulder abduction 165   115, limited by pain, empty end feel 130, limited by pain c empty end feel 90 90 90  Shoulder adduction           Shoulder internal rotation T7         Shoulder external rotation T6  45d Limited by pain 45d, not to exceed yet      Elbow flexion           Elbow extension           Wrist flexion           Wrist extension           Wrist ulnar deviation           Wrist radial deviation           Wrist pronation           Wrist supination           Empty end feel for PROM for L shoulder flexion, abd, and ER (Blank rows = not tested)  Passive ROM Right 10/2821       Shoulder flexion AROM 130       Shoulder extension         Shoulder abduction AROM 90, limited by pin and weakness        Shoulder adduction          Shoulder internal rotation PROM 45d, limited by pain        Shoulder external rotation PROM, limited by pain       Elbow flexion          Elbow extension          Wrist flexion          Wrist extension          Wrist ulnar deviation          Wrist radial deviation          Wrist pronation          Wrist supination              UPPER EXTREMITY MMT:                        L not tested due to protocol   PALPATION:  TTP of the upper trap and supraspinatus region   OBSERVATION: Steri-strips covering the incision             TODAY'S TREATMENT:  [facility] Adult PT Treatment:  DATE: 11/24/21 Therapeutic Exercise: Shoulder pulleys flexion and scaption 2 min each Bilat Shoulder row 2x15 RTB Bilat Shoulder ext 2x15 RTB Shoulder ER 1x10 RTB Shoulder IR 1x10 RTB S/L left shoulder abduction x 10 S/L Left shoulder flexion x 10 S/L ER x 10 Supine shoulder flexion circles # clockwise and counter clockwise x 10 each  Modalities: Vaso x 15 mod minimal compression, 36dF, left shoulder  [facility] Adult PT Treatment:                                                DATE: 11/19/21 Therapeutic Exercise: Shoulder pulleys flexion and scaption 2 min each SL L sleeper IR stretch x3 30" Bilat shoulder flexion and scaption to 90d x10 each no weight L shoulder ER 2x10 Bilat Shoulder row 2x15 RTB Bilat Shoulder ext 2x15 RTB Updated HEP Modalities: Vaso x 15 mins minimal compression, 36dF, left shoulder  [facility] Adult PT Treatment:                                                DATE: 11/16/21 Therapeutic Exercise: Shoulder pulleys flexion and scaption 2 min each Shoulder flexion stretch on table top x01 10" 60d doorway pec stretch x3 30" SL L sleeper IR stretch x3 30" Shoulder row 2x15 RTB Shoulder ext 2x15 RTB Modalities: Vaso x 15 mins minimal compression, 36dF, left shoulder  [facility] Adult PT Treatment:                                                DATE: 11/12/21 Therapeutic Exercise: Shoulder pulleys flexion and scaption 2 min each Eccentric bicep curl 2# 10 x 1 palm up, x 10 palm in Shoulder row 2x15 RTB Shoulder ext 2x15 RTB Shoulder ER 1x10 RTB Shoulder IR 1x10 RTB Supine shoulder flexion circles 2# -pain, better tolerated with 1# clockwise and counter clockwise Supine horizontal abduction red band x 15  Modalities: Vaso x 15  mins minimal compression, 36dF, left shoulder   PATIENT EDUCATION: Education details: Eval findings, POC, HEP Person educated: Patient Education method: Explanation, Demonstration, Tactile cues, Verbal cues, and Handouts Education comprehension: verbalized understanding, returned demonstration, verbal cues required, and tactile cues required     HOME EXERCISE PROGRAM: Access Code: [email or web address] URL: [email or web address] Date: 11/19/2021 Prepared by: Gar Ponto  Exercises - Flexion-Extension Shoulder Pendulum with Table Support  - 2 x daily - 7 x weekly - 2 sets - 10 reps - Horizontal Shoulder Pendulum with Table Support  - 2 x daily - 7 x weekly - 2 sets - 10 reps - Seated Shoulder Flexion Slide at Table Top with Forearm in Neutral  - 2 x daily - 7 x weekly - 1 sets - 5 reps - 5 hold - Seated Shoulder Scaption Slide at Table Top with Forearm in Neutral  - 2 x daily - 7 x weekly - 1 sets - 5 reps - 5 hold - Seated Shoulder External Rotation PROM on Table  - 2 x daily - 7 x weekly - 1 sets - 5 reps - 5 hold - Isometric Shoulder Flexion at Wall  -  2 x daily - 7 x weekly - 1 sets - 5 reps - Isometric Shoulder Extension at Wall  - 2 x daily - 7 x weekly - 1 sets - 5 reps - 5 hold - Isometric Shoulder Abduction at Wall  - 2 x daily - 7 x weekly - 1 sets - 5 reps - 5 hold - Standing Isometric Shoulder External Rotation with Doorway  - 2 x daily - 7 x weekly - 1 sets - 5 reps - 5 hold - Standing Isometric Shoulder Internal Rotation at Doorway  - 2 x daily - 7 x weekly - 1 sets - 5 reps - 5 hold - Standing Shoulder Row with Anchored Resistance  - 2 x daily - 7 x weekly - 2 sets - 10 reps - 3 hold - Shoulder extension with resistance - Neutral  - 2 x daily - 7 x weekly - 3 sets - 10 reps - 3 hold - Shoulder External Rotation with Anchored Resistance  - 1 x daily - 7 x weekly - 2 sets - 10 reps - 3 hold - Shoulder Internal Rotation with Resistance (Mirrored)  - 1 x daily - 7 x weekly -  2 sets - 10 reps - 3 hold - Sleeper Stretch  - 1 x daily - 7 x weekly - 1 sets - 3 reps - 30 hold    ASSESSMENT:   CLINICAL IMPRESSION: Continued strengthening while monitoring for pain increase. Shoulder AROM improved for flexion.  Abd is sill limited to 90d by pain. Began sidelying shoulder AROM which was tolerated well. Pt will continue to benefit from skilled PT to address pain, ROM and strength to otpimize L shoulder function.   OBJECTIVE IMPAIRMENTS decreased ROM, decreased strength, impaired UE functional use, postural dysfunction, and pain.    ACTIVITY LIMITATIONS carrying, lifting, sleeping, dressing, and reach over head   PARTICIPATION LIMITATIONS: meal prep, cleaning, laundry, driving, shopping, community activity, and yard work   PERSONAL FACTORS Past/current experiences, Time since onset of injury/illness/exacerbation, and 1 comorbidity: DM  are also affecting patient's functional outcome.    REHAB POTENTIAL: Good   CLINICAL DECISION MAKING: Stable/uncomplicated   EVALUATION COMPLEXITY: Low     GOALS:   SHORT TERM GOALS: Target date: 10/12/2021  (Remove Blue Hyperlink)   Pt will be Ind in an initial HEP Baseline:started on eval Goal status: MET   2.  Increase L shoulder flexion PROM to 125d and abd to 135d to progress to proper R shoulder function Baseline: flex= 105d and abd= 115d Goal status: MET   LONG TERM GOALS: Target date: 01/21/22   Increase L shoulder strength to 4 or > for improved R shoulder function Baseline: NT Status: 11/01/21: grossly 3/5  Goal status: Improving   2.  Increase L shoulder AROM to at least 85% of the R shoulder to complete above shoulder reaching  Baseline: See flow sheet Status; 11/18/21: Flexion 130d, abd 90d Goal status: Improving   3. Decrease L shoulder pain to 3/10 or less for improved L UE function and QOL  Baseline: 4-10/10 Status 8/10 Goal status: No improvement   4.  Pt will complete L shoulder above shoulder  reaching c min to zero scapular shrug Baseline: unable Goal status: MET   5.  Pt will score 27 or less with the UE Quickdash as indication of improved function Baseline: 53 Status:11/01/21: 43/55 Goal status: Improving    6.  Pt will be Ind in a final HEP to maintain achieved LOF  and QOL Baselinestarted on eval  Goal status: INITIAL     PLAN: PT FREQUENCY: 1w8   PT DURATION: 8 weeks   PLANNED INTERVENTIONS: Therapeutic exercises, Therapeutic activity, Neuromuscular re-education, Balance training, Gait training, Patient/Family education, Joint mobilization, Aquatic Therapy, Dry Needling, Electrical stimulation, Cryotherapy, Moist heat, Taping, Vasopneumatic device, Ultrasound, Ionotophoresis 7m/ml Dexamethasone, Manual therapy, and Re-evaluation   PLAN FOR NEXT SESSION: strengthening as tolerated. . Reassess and re-cert for additional PT as indicated.  JHessie Diener PTA 11/24/21 11:06 AM Phone: [phone]: [phone]

## 2021-12-01 ENCOUNTER — Ambulatory Visit (INDEPENDENT_AMBULATORY_CARE_PROVIDER_SITE_OTHER): Payer: Medicaid Other | Admitting: Surgical

## 2021-12-01 DIAGNOSIS — Z9889 Other specified postprocedural states: Secondary | ICD-10-CM

## 2021-12-01 MED ORDER — IBUPROFEN 800 MG PO TABS
800.0000 mg | ORAL_TABLET | Freq: Three times a day (TID) | ORAL | 1 refills | Status: DC | PRN
Start: 2021-12-01 — End: 2022-01-17

## 2021-12-02 ENCOUNTER — Ambulatory Visit: Payer: Medicaid Other

## 2021-12-02 ENCOUNTER — Telehealth: Payer: Self-pay | Admitting: Surgical

## 2021-12-02 DIAGNOSIS — M25612 Stiffness of left shoulder, not elsewhere classified: Secondary | ICD-10-CM

## 2021-12-02 DIAGNOSIS — M6281 Muscle weakness (generalized): Secondary | ICD-10-CM

## 2021-12-02 DIAGNOSIS — G8929 Other chronic pain: Secondary | ICD-10-CM

## 2021-12-02 DIAGNOSIS — M25512 Pain in left shoulder: Secondary | ICD-10-CM | POA: Diagnosis not present

## 2021-12-02 NOTE — Therapy (Signed)
OUTPATIENT PHYSICAL THERAPY TREATMENT NOTE/Re-Cert   Patient Name: [name] [name] MRN: [id number] DOB:1960-10-29, 61 y.o., female Today's Date: 12/02/2021  PCP: [name], MD  REFERRING PROVIDER: Meredith Pel, MD   END OF SESSION:   PT End of Session - 12/02/21 1123     Visit Number 15    Number of Visits 21    Date for PT Re-Evaluation 01/21/22    Authorization Type [place] MEDICAID [place] - Number of Visits 27    Progress Note Due on Visit 20    PT Start Time 1125    PT Stop Time 1218   Vaso   PT Time Calculation (min) 53 min    Activity Tolerance Patient limited by pain;Patient tolerated treatment well    Behavior During Therapy [facility] for tasks assessed/performed             Past Medical History:  Diagnosis Date   Allergic rhinitis 05/09/2006   Allergy    Anxiety    Arthritis    Asthma    CHF (congestive heart failure) (HCC)    Chronic back pain    Diabetes mellitus without complication (HCC)    GERD (gastroesophageal reflux disease)    Heart murmur    as a child   Hepatitis C    genotype 1b, 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 s/p 12 week course of simeprevir and sofosbuvir between October 2014 and January 2015 with resolution.   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   Pneumonia    Prediabetes    Refusal of blood transfusions as patient is Jehovah's Witness    Secondary adrenal insufficiency ([place]) 06/29/2014   Urticaria    Past Surgical History:  Procedure Laterality Date   BUNIONECTOMY     b/l   COLECTOMY     2003 for diverticulitis, had colostomy bag and then reversed   COLONOSCOPY     HAND SURGERY     HAND SURGERY Right    right thumb surgery Dr Burney Gauze 2021   NASAL SINUS SURGERY     SHOULDER ARTHROSCOPY WITH OPEN ROTATOR CUFF REPAIR AND DISTAL CLAVICLE  ACROMINECTOMY Left 08/31/2021   Procedure: LEFT SHOULDER ARTHROSCOPY, DEBRIDEMENT,  ROTATOR CUFF TEAR REPAIR;  Surgeon: Meredith Pel, MD;  Location: [facility];  Service: Orthopedics;  Laterality: Left;   SHOULDER SURGERY     left   TONSILLECTOMY     Patient Active Problem List   Diagnosis Date Noted   Synovitis of left shoulder    Complete tear of left rotator cuff    Acute otitis media with perforated tympanic membrane, right 08/03/2021   Impingement syndrome of left shoulder 07/01/2021   Left leg swelling 07/09/2020   Grief reaction 06/08/2020   Arm pain 02/10/2020   Dizziness on standing 12/27/2019   Generalized anxiety disorder with panic attacks 06/20/2019   Osteoarthritis of thumb, right 11/29/2018   Chronic migraine w/o aura w/o status migrainosus, not intractable 08/27/2018   Disorder of left eustachian tube 05/19/2017   Vertigo of central origin 01/23/2017   Central perforation of tympanic membrane of left ear 12/09/2016   Eustachian tube dysfunction, bilateral 06/15/2016   Ear pain, right 06/15/2016   Spondylosis of lumbar region without myelopathy or radiculopathy 10/08/2015   BPPV (benign paroxysmal positional vertigo)  10/02/2015   Liver fibrosis ([place]) 12/04/2014   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   Vitamin D deficiency 06/29/2014   Secondary adrenal insufficiency ([place]) 06/29/2014   History of tympanostomy tube placement 02/07/2014   Refusal of blood transfusions as patient is Jehovah's Witness 11/18/2013   Hepatitis C virus infection cured after antiviral drug therapy 03/16/2012   Health care maintenance 12/15/2011   Opioid dependence ([place]) 10/05/2010   HTN (hypertension) 10/05/2010   Hyperlipidemia associated with type 2 diabetes mellitus ([place]) 10/23/2008   HIV disease ([place]) 05/09/2006   Depression 05/09/2006   Allergic rhinitis 05/09/2006   GERD 05/09/2006    REFERRING DIAG: M79.602 (ICD-10-CM) -  Left arm pain.  Left shoulder arthroscopy with debridement and rotator cuff tear repair.  THERAPY DIAG:  Chronic left shoulder pain  Muscle weakness (generalized)  Stiffness of left shoulder, not elsewhere classified  Rationale for Evaluation and Treatment Rehabilitation  Onset: 08/31/21   SUBJECTIVE:  Pt reports she saw Dr. Marlou Sa yesterday and that he said to continue with PT and that it would be "a long road" to recovery.                                                                                                                                                                 NPRS: 8/10, 8/10 with reaching  Pain location: L shoulder  Pain description: ache , throb, sharp Aggravating factors: L shoulder movement Relieving factors: Ice man, rest, medications Pain range 7-8/10   PRECAUTIONS: Other: per Dr. Randel Pigg visit note, pt may DC sling with start of PT. Pt is not to completing lifting of the L arm  From: Donella Stade, PA-C  Should be good for full active range of motion and early rotator cuff strengthening exercises starting on 10/13/2021 which will be about 6 weeks out from surgery.     PERTINENT HISTORY: left shoulder arthroscopy debridement rotator cuff tear repair 08/31/21, arthritis, anxiety, CHF, DM   WEIGHT BEARING RESTRICTIONS No   FALLS:  Has patient fallen in last 6 months? No   LIVING ENVIRONMENT: Lives with: lives with their family and lives alone Lives in: House/apartment Pt is able to access and be mobile with in her home   OCCUPATION: Disabled   PLOF: Independent   PATIENT GOALS Use it with less pain, better use with cooking and cleaning   OBJECTIVE: (objective measures completed at initial evaluation unless otherwise dated)   DIAGNOSTIC FINDINGS:  L shoulder MRI 05/25/21 IMPRESSION: 1. Chronic calcific tendinosis of the posterior supraspinatus with moderate intermediate T2 signal and thickening tendinosis of the mid to posterior  supraspinatus and anterior infraspinatus. 2. Additional likely chronic calcific tendinosis of the superior subscapularis tendon along with a moderate partial-thickness tear. 3.  Status post distal clavicle excision and acromioplasty. 4. Mild subacromial/subdeltoid bursitis. 5. Mild-to-moderate glenohumeral cartilage degenerative changes   PATIENT SURVEYS:  Quick Dash 53/55. 10/20/21=43/55   COGNITION:           Overall cognitive status: Within functional limits for tasks assessed                                  SENSATION: WFL   POSTURE: Forward head c CT step off and dorsal hump, increased kyphosis, rounded shoulders,    UPPER EXTREMITY ROM:    Passive ROM Right 09/21/2021 Left 09/21/2021 LT 10/01/21 LT 10/11/21 LT AROM  10/25/21 LT AROM 11/02/21 LT 11/24/21 LT 12/02/21  Shoulder flexion 160 105 d, limited by pain 125, limited by pain, empty endfeel 140, limited by pain c empty end feel 110 120 130   Shoulder extension   115, limited by pain        Shoulder abduction 165   115, limited by pain, empty end feel 130, limited by pain c empty end feel 90 90 90 90 p!  Shoulder adduction            Shoulder internal rotation T7          Shoulder external rotation T6  45d Limited by pain 45d, not to exceed yet       Elbow flexion            Elbow extension            Wrist flexion            Wrist extension            Wrist ulnar deviation            Wrist radial deviation            Wrist pronation            Wrist supination            Empty end feel for PROM for L shoulder flexion, abd, and ER (Blank rows = not tested)  Passive ROM Right 10/2821       Shoulder flexion AROM 130       Shoulder extension         Shoulder abduction AROM 90, limited by pin and weakness        Shoulder adduction          Shoulder internal rotation PROM 45d, limited by pain        Shoulder external rotation PROM, limited by pain       Elbow flexion          Elbow extension          Wrist flexion           Wrist extension          Wrist ulnar deviation          Wrist radial deviation          Wrist pronation          Wrist supination              UPPER EXTREMITY MMT:                       L not tested due to protocol   PALPATION:  TTP of the upper trap and supraspinatus region   OBSERVATION: Steri-strips covering the incision  TODAY'S TREATMENT:  [facility] Adult PT Treatment:                                                DATE: 12/02/2021 Therapeutic Exercise: Shoulder pulleys flexion and scaption 2 min each Bilat Shoulder row 2x15 RTB Bilat Shoulder ext 2x15 RTB Shoulder ER 1x10 RTB Lt Shoulder IR 2x10 RTB Lt S/L left shoulder abduction x 10 S/L Left shoulder flexion x 10 S/L ER x 10 Supine shoulder flexion circles 1# clockwise and counter clockwise x 10 each Modalities: Vaso x 15 mod minimal compression, 36dF, left shoulder  [facility] Adult PT Treatment:                                                DATE: 11/24/21 Therapeutic Exercise: Shoulder pulleys flexion and scaption 2 min each Bilat Shoulder row 2x15 RTB Bilat Shoulder ext 2x15 RTB Shoulder ER 1x10 RTB Shoulder IR 1x10 RTB S/L left shoulder abduction x 10 S/L Left shoulder flexion x 10 S/L ER x 10 Supine shoulder flexion circles # clockwise and counter clockwise x 10 each  Modalities: Vaso x 15 mod minimal compression, 36dF, left shoulder  [facility] Adult PT Treatment:                                                DATE: 11/19/21 Therapeutic Exercise: Shoulder pulleys flexion and scaption 2 min each SL L sleeper IR stretch x3 30" Bilat shoulder flexion and scaption to 90d x10 each no weight L shoulder ER 2x10 Bilat Shoulder row 2x15 RTB Bilat Shoulder ext 2x15 RTB Updated HEP Modalities: Vaso x 15 mins minimal compression, 36dF, left shoulder  [facility] Adult PT Treatment:                                                DATE: 11/16/21 Therapeutic Exercise: Shoulder pulleys flexion and scaption 2 min  each Shoulder flexion stretch on table top x01 10" 60d doorway pec stretch x3 30" SL L sleeper IR stretch x3 30" Shoulder row 2x15 RTB Shoulder ext 2x15 RTB Modalities: Vaso x 15 mins minimal compression, 36dF, left shoulder   PATIENT EDUCATION: Education details: Eval findings, POC, HEP Person educated: Patient Education method: Explanation, Demonstration, Tactile cues, Verbal cues, and Handouts Education comprehension: verbalized understanding, returned demonstration, verbal cues required, and tactile cues required     HOME EXERCISE PROGRAM: Access Code: [email or web address] URL: [email or web address] Date: 11/19/2021 Prepared by: Gar Ponto  Exercises - Flexion-Extension Shoulder Pendulum with Table Support  - 2 x daily - 7 x weekly - 2 sets - 10 reps - Horizontal Shoulder Pendulum with Table Support  - 2 x daily - 7 x weekly - 2 sets - 10 reps - Seated Shoulder Flexion Slide at Table Top with Forearm in Neutral  - 2 x daily - 7 x weekly - 1 sets - 5 reps - 5 hold - Seated Shoulder Scaption Slide at Table Top with Forearm in Neutral  -  2 x daily - 7 x weekly - 1 sets - 5 reps - 5 hold - Seated Shoulder External Rotation PROM on Table  - 2 x daily - 7 x weekly - 1 sets - 5 reps - 5 hold - Isometric Shoulder Flexion at Wall  - 2 x daily - 7 x weekly - 1 sets - 5 reps - Isometric Shoulder Extension at Wall  - 2 x daily - 7 x weekly - 1 sets - 5 reps - 5 hold - Isometric Shoulder Abduction at Wall  - 2 x daily - 7 x weekly - 1 sets - 5 reps - 5 hold - Standing Isometric Shoulder External Rotation with Doorway  - 2 x daily - 7 x weekly - 1 sets - 5 reps - 5 hold - Standing Isometric Shoulder Internal Rotation at Doorway  - 2 x daily - 7 x weekly - 1 sets - 5 reps - 5 hold - Standing Shoulder Row with Anchored Resistance  - 2 x daily - 7 x weekly - 2 sets - 10 reps - 3 hold - Shoulder extension with resistance - Neutral  - 2 x daily - 7 x weekly - 3 sets - 10 reps - 3 hold -  Shoulder External Rotation with Anchored Resistance  - 1 x daily - 7 x weekly - 2 sets - 10 reps - 3 hold - Shoulder Internal Rotation with Resistance (Mirrored)  - 1 x daily - 7 x weekly - 2 sets - 10 reps - 3 hold - Sleeper Stretch  - 1 x daily - 7 x weekly - 1 sets - 3 reps - 30 hold    ASSESSMENT:   CLINICAL IMPRESSION: Patient presents to PT with continued high levels of pain in her Lt shoulder and reports HEP compliance. Session today continued to focus on RTC strengthening and ROM, remaining most limited in abduction by pain. She remains limited by pain throughout session. Patient continues to benefit from skilled PT services and should be progressed as able to improve functional independence.   OBJECTIVE IMPAIRMENTS decreased ROM, decreased strength, impaired UE functional use, postural dysfunction, and pain.    ACTIVITY LIMITATIONS carrying, lifting, sleeping, dressing, and reach over head   PARTICIPATION LIMITATIONS: meal prep, cleaning, laundry, driving, shopping, community activity, and yard work   PERSONAL FACTORS Past/current experiences, Time since onset of injury/illness/exacerbation, and 1 comorbidity: DM  are also affecting patient's functional outcome.    REHAB POTENTIAL: Good   CLINICAL DECISION MAKING: Stable/uncomplicated   EVALUATION COMPLEXITY: Low     GOALS:   SHORT TERM GOALS: Target date: 10/12/2021  (Remove Blue Hyperlink)   Pt will be Ind in an initial HEP Baseline:started on eval Goal status: MET   2.  Increase L shoulder flexion PROM to 125d and abd to 135d to progress to proper R shoulder function Baseline: flex= 105d and abd= 115d Goal status: MET   LONG TERM GOALS: Target date: 01/21/22   Increase L shoulder strength to 4 or > for improved R shoulder function Baseline: NT Status: 11/01/21: grossly 3/5  Goal status: Improving   2.  Increase L shoulder AROM to at least 85% of the R shoulder to complete above shoulder reaching  Baseline: See  flow sheet Status; 11/18/21: Flexion 130d, abd 90d Goal status: Improving   3. Decrease L shoulder pain to 3/10 or less for improved L UE function and QOL  Baseline: 4-10/10 Status 8/10 Goal status: No improvement   4.  Pt will complete L shoulder above shoulder reaching c min to zero scapular shrug Baseline: unable Goal status: MET   5.  Pt will score 27 or less with the UE Quickdash as indication of improved function Baseline: 53 Status:11/01/21: 43/55 Goal status: Improving    6.  Pt will be Ind in a final HEP to maintain achieved LOF and QOL Baselinestarted on eval  Goal status: INITIAL     PLAN: PT FREQUENCY: 1w8   PT DURATION: 8 weeks   PLANNED INTERVENTIONS: Therapeutic exercises, Therapeutic activity, Neuromuscular re-education, Balance training, Gait training, Patient/Family education, Joint mobilization, Aquatic Therapy, Dry Needling, Electrical stimulation, Cryotherapy, Moist heat, Taping, Vasopneumatic device, Ultrasound, Ionotophoresis 10m/ml Dexamethasone, Manual therapy, and Re-evaluation   PLAN FOR NEXT SESSION: strengthening as tolerated. . Reassess and re-cert for additional PT as indicated.   SMargarette [place] PTA 12/02/21 12:04 PM

## 2021-12-02 NOTE — Telephone Encounter (Signed)
Pt called stating [facility] was to send celbrate. Please call pt about this matter at [phone].

## 2021-12-02 NOTE — Telephone Encounter (Signed)
I sent prescription strength Ibuprofen instead due to some drug reactions that came up with celebrex but not Ibuprofen

## 2021-12-03 NOTE — Telephone Encounter (Signed)
LM on VM of the info below

## 2021-12-05 ENCOUNTER — Encounter: Payer: Self-pay | Admitting: Orthopedic Surgery

## 2021-12-05 NOTE — Progress Notes (Signed)
Post-Op Visit Note   Patient: [name] [name]           Date of Birth: 04-Jan-1961           MRN: [id number] Visit Date: 12/01/2021 PCP: [name], MD   Assessment & Plan:  Chief Complaint:  Chief Complaint  Patient presents with   Left Shoulder - Follow-up   Other    08/31/21 (4m Left Shoulder Arthroscopy, Debridement,  Rotator Cuff Tear Repair       Visit Diagnoses:  1. Status post rotator cuff repair     Plan: Patient is a 61year old female who presents s/p left shoulder arthroscopy with rotator cuff repair on 08/31/2021.  Doing well and feels she is still making progress.  Definitely feels better than she did at her last appointment.  Has not plateaued or had a wall yet in physical therapy.  She is going to PT 1 time per week and has 7 more sessions scheduled.  She notes continued difficulty with abduction though it feels like it is getting easier.  She sleeps okay at night.  She would like to try different anti-inflammatory.  Plan to try prescription strength ibuprofen.  On exam, she has 45 degrees external rotation, 90 degrees abduction, 160 degrees forward flexion.  Incisions are well-healed.  Axillary nerve intact with deltoid firing.  5/5 motor strength of external rotation and subscapularis with 5 -/5 supraspinatus strength.  She has full active range of motion equivalent to passive range of motion.  Plan to follow-up in 2 months for final check with Dr. DMarlou Sa  Follow-Up Instructions: No follow-ups on file.   Orders:  No orders of the defined types were placed in this encounter.  Meds ordered this encounter  Medications   ibuprofen (ADVIL) 800 MG tablet    Sig: Take 1 tablet (800 mg total) by mouth every 8 (eight) hours as needed.    Dispense:  60 tablet    Refill:  1    Imaging: No results found.  PMFS History: Patient Active Problem List   Diagnosis Date Noted   Synovitis of left shoulder    Complete tear of left rotator cuff    Acute otitis media  with perforated tympanic membrane, right 08/03/2021   Impingement syndrome of left shoulder 07/01/2021   Left leg swelling 07/09/2020   Grief reaction 06/08/2020   Arm pain 02/10/2020   Dizziness on standing 12/27/2019   Generalized anxiety disorder with panic attacks 06/20/2019   Osteoarthritis of thumb, right 11/29/2018   Chronic migraine w/o aura w/o status migrainosus, not intractable 08/27/2018   Disorder of left eustachian tube 05/19/2017   Vertigo of central origin 01/23/2017   Central perforation of tympanic membrane of left ear 12/09/2016   Eustachian tube dysfunction, bilateral 06/15/2016   Ear pain, right 06/15/2016   Spondylosis of lumbar region without myelopathy or radiculopathy 10/08/2015   BPPV (benign paroxysmal positional vertigo) 10/02/2015   Liver fibrosis ([place] 12/04/2014   Pituitary microadenoma ([place] 08/08/2014   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[place]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/2014   Vitamin D deficiency 06/29/2014   Secondary adrenal insufficiency ([place] 06/29/2014   History of tympanostomy tube placement 02/07/2014   Refusal of blood transfusions as patient is Jehovah's Witness 11/18/2013   Hepatitis C virus infection cured after antiviral drug therapy 03/16/2012   Health care maintenance 12/15/2011   Opioid dependence ([place] 10/05/2010   HTN (hypertension) 10/05/2010   Hyperlipidemia associated with type 2 diabetes mellitus ([place] 10/23/2008   HIV disease ([place] 05/09/2006  Depression 05/09/2006   Allergic rhinitis 05/09/2006   GERD 05/09/2006   Past Medical History:  Diagnosis Date   Allergic rhinitis 05/09/2006   Allergy    Anxiety    Arthritis    Asthma    CHF (congestive heart failure) (HCC)    Chronic back pain    Diabetes mellitus without complication (HCC)    GERD (gastroesophageal reflux disease)    Heart murmur    as a child   Hepatitis C    genotype 1b, 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 s/p 12 week course of simeprevir and sofosbuvir between  October 2014 and January 2015 with resolution.   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   Pneumonia    Prediabetes    Refusal of blood transfusions as patient is Jehovah's Witness    Secondary adrenal insufficiency ([place]) 06/29/2014   Urticaria     Family History  Problem Relation Age of Onset   Heart disease Mother    Diabetes Mother    Stroke Mother    Heart disease Father    Stroke Father    Diabetes Father    Hepatitis Sister        hcv   Asthma Sister    Allergic rhinitis Sister    Stroke Other    Colon polyps Brother    Renal Disease Brother    Cancer Sister        lung   Asthma Sister    Allergic rhinitis Sister    Cancer Maternal Aunt    Cancer Maternal Aunt    Colon cancer Neg Hx    Esophageal cancer Neg Hx    Stomach cancer Neg Hx    Rectal cancer Neg Hx    Angioedema Neg Hx    Eczema Neg Hx    Urticaria Neg Hx     Past Surgical History:  Procedure Laterality Date   BUNIONECTOMY     b/l   COLECTOMY     2003 for diverticulitis, had colostomy bag and then reversed   COLONOSCOPY     HAND SURGERY     HAND SURGERY Right    right thumb surgery Dr Weingold 2021   [place] ARTHROSCOPY WITH OPEN ROTATOR CUFF REPAIR AND DISTAL CLAVICLE ACROMINECTOMY Left 08/31/2021   Procedure: LEFT SHOULDER ARTHROSCOPY, DEBRIDEMENT,  ROTATOR CUFF TEAR REPAIR;  Surgeon: Meredith Pel, MD;  Location: [facility];  Service: Orthopedics;  Laterality: Left;   SHOULDER SURGERY     left   TONSILLECTOMY     Social History   Occupational History   Occupation: Disabled  Tobacco Use   Smoking status: Former    Years: 20.00    Types: Cigarettes    Quit date: 04/26/2007    Years since quitting: 14.6   Smokeless tobacco: Never   Tobacco comments:    QUIT 2009  Vaping Use   Vaping Use: Never used  Substance and Sexual Activity    Alcohol use: No    Alcohol/week: 0.0 standard drinks of alcohol   Drug use: Not Currently    Types: Marijuana    Comment: cannabis in the past   Sexual activity: Not Currently    Partners: Male    Birth control/protection: Condom    Comment: declined condoms

## 2021-12-08 NOTE — Therapy (Signed)
OUTPATIENT PHYSICAL THERAPY TREATMENT NOTE/Re-Cert   Patient Name: [name] [name] MRN: [id number] DOB:[birth year], 61 y.o., female Today's Date: 12/09/2021  PCP: [name], MD  REFERRING PROVIDER: Meredith Pel, MD   END OF SESSION:   PT End of Session - 12/09/21 1213     Visit Number 16    Number of Visits 21    Date for PT Re-Evaluation 01/21/22    Authorization Type [place] MEDICAID [place] - Number of Visits 27    Progress Note Due on Visit 20    PT Start Time 1145    PT Stop Time 1228    PT Time Calculation (min) 43 min    Activity Tolerance Patient limited by pain;Patient tolerated treatment well    Behavior During Therapy [facility] for tasks assessed/performed              Past Medical History:  Diagnosis Date   Allergic rhinitis 05/09/2006   Allergy    Anxiety    Arthritis    Asthma    CHF (congestive heart failure) (HCC)    Chronic back pain    Diabetes mellitus without complication (HCC)    GERD (gastroesophageal reflux disease)    Heart murmur    as a child   Hepatitis C    genotype 1b, 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 s/p 12 week course of simeprevir and sofosbuvir between October 2014 and January 2015 with resolution.   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   Pneumonia    Prediabetes    Refusal of blood transfusions as patient is Jehovah's Witness    Secondary adrenal insufficiency ([place]) 06/29/2014   Urticaria    Past Surgical History:  Procedure Laterality Date   BUNIONECTOMY     b/l   COLECTOMY     2003 for diverticulitis, had colostomy bag and then reversed   COLONOSCOPY     HAND SURGERY     HAND SURGERY Right    right thumb surgery Dr Burney Gauze 2021   NASAL SINUS SURGERY     SHOULDER ARTHROSCOPY WITH OPEN ROTATOR CUFF REPAIR AND DISTAL CLAVICLE ACROMINECTOMY  Left 08/31/2021   Procedure: LEFT SHOULDER ARTHROSCOPY, DEBRIDEMENT,  ROTATOR CUFF TEAR REPAIR;  Surgeon: Meredith Pel, MD;  Location: [facility];  Service: Orthopedics;  Laterality: Left;   SHOULDER SURGERY     left   TONSILLECTOMY     Patient Active Problem List   Diagnosis Date Noted   Synovitis of left shoulder    Complete tear of left rotator cuff    Acute otitis media with perforated tympanic membrane, right 08/03/2021   Impingement syndrome of left shoulder 07/01/2021   Left leg swelling 07/09/2020   Grief reaction 06/08/2020   Arm pain 02/10/2020   Dizziness on standing 12/27/2019   Generalized anxiety disorder with panic attacks 06/20/2019   Osteoarthritis of thumb, right 11/29/2018   Chronic migraine w/o aura w/o status migrainosus, not intractable 08/27/2018   Disorder of left eustachian tube 05/19/2017   Vertigo of central origin 01/23/2017   Central perforation of tympanic membrane of left ear 12/09/2016   Eustachian tube dysfunction, bilateral 06/15/2016   Ear pain, right 06/15/2016   Spondylosis of lumbar region without myelopathy or radiculopathy 10/08/2015   BPPV (benign paroxysmal positional vertigo) 10/02/2015  Liver fibrosis ([place]) 12/04/2014   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   Vitamin D deficiency 06/29/2014   Secondary adrenal insufficiency ([place]) 06/29/2014   History of tympanostomy tube placement 02/07/2014   Refusal of blood transfusions as patient is Jehovah's Witness 11/18/2013   Hepatitis C virus infection cured after antiviral drug therapy 03/16/2012   Health care maintenance 12/15/2011   Opioid dependence ([place]) 10/05/2010   HTN (hypertension) 10/05/2010   Hyperlipidemia associated with type 2 diabetes mellitus ([place]) 10/23/2008   HIV disease ([place]) 05/09/2006   Depression 05/09/2006   Allergic rhinitis 05/09/2006   GERD 05/09/2006    REFERRING DIAG: M79.602 (ICD-10-CM) - Left arm pain.   Left shoulder arthroscopy with debridement and rotator cuff tear repair.  THERAPY DIAG:  Chronic left shoulder pain  Muscle weakness (generalized)  Stiffness of left shoulder, not elsewhere classified  Rationale for Evaluation and Treatment Rehabilitation  Onset: 08/31/21   SUBJECTIVE:  Pt reports her L shoulder is gradually improving with movement. Pt notes lifting her arm to the side is very difficult.                                                                                                                                                                NPRS: 6/10, 8/10 with reaching  Pain location: L shoulder  Pain description: ache , throb, sharp Aggravating factors: L shoulder movement Relieving factors: Ice man, rest, medications Pain range 6-8/10   PRECAUTIONS: Other: per Dr. Randel Pigg visit note, pt may DC sling with start of PT. Pt is not to completing lifting of the L arm  From: Donella Stade, PA-C  Should be good for full active range of motion and early rotator cuff strengthening exercises starting on 10/13/2021 which will be about 6 weeks out from surgery.     PERTINENT HISTORY: left shoulder arthroscopy debridement rotator cuff tear repair 08/31/21, arthritis, anxiety, CHF, DM   WEIGHT BEARING RESTRICTIONS No   FALLS:  Has patient fallen in last 6 months? No   LIVING ENVIRONMENT: Lives with: lives with their family and lives alone Lives in: House/apartment Pt is able to access and be mobile with in her home   OCCUPATION: Disabled   PLOF: Independent   PATIENT GOALS Use it with less pain, better use with cooking and cleaning   OBJECTIVE: (objective measures completed at initial evaluation unless otherwise dated)   DIAGNOSTIC FINDINGS:  L shoulder MRI 05/25/21 IMPRESSION: 1. Chronic calcific tendinosis of the posterior supraspinatus with moderate intermediate T2 signal and thickening tendinosis of the mid to posterior supraspinatus and anterior  infraspinatus. 2. Additional likely chronic calcific tendinosis of the superior subscapularis tendon along with a moderate partial-thickness tear. 3. Status post distal clavicle excision and acromioplasty. 4.  Mild subacromial/subdeltoid bursitis. 5. Mild-to-moderate glenohumeral cartilage degenerative changes   PATIENT SURVEYS:  Quick Dash 53/55. 10/20/21=43/55   COGNITION:           Overall cognitive status: Within functional limits for tasks assessed                                  SENSATION: WFL   POSTURE: Forward head c CT step off and dorsal hump, increased kyphosis, rounded shoulders,    UPPER EXTREMITY ROM:    Passive ROM Right 09/21/2021 Left 09/21/2021 LT 10/01/21 LT 10/11/21 LT AROM  10/25/21 LT AROM 11/02/21 LT 11/24/21 LT 12/02/21 LT 12/09/21  Shoulder flexion 160 105 d, limited by pain 125, limited by pain, empty endfeel 140, limited by pain c empty end feel 110 120 130 140d 140d  Shoulder extension   115, limited by pain         Shoulder abduction 165   115, limited by pain, empty end feel 130, limited by pain c empty end feel 90 90 90 90p!   Shoulder adduction             Shoulder internal rotation T7           Shoulder external rotation T6  45d Limited by pain 45d, not to exceed yet     T2 T2  Elbow flexion             Elbow extension             Wrist flexion             Wrist extension             Wrist ulnar deviation             Wrist radial deviation             Wrist pronation             Wrist supination             Empty end feel for PROM for L shoulder flexion, abd, and ER (Blank rows = not tested)  Passive ROM Right 10/2821       Shoulder flexion AROM 130       Shoulder extension         Shoulder abduction AROM 90, limited by pin and weakness        Shoulder adduction          Shoulder internal rotation PROM 45d, limited by pain        Shoulder external rotation PROM, limited by pain       Elbow flexion          Elbow extension          Wrist  flexion          Wrist extension          Wrist ulnar deviation          Wrist radial deviation          Wrist pronation          Wrist supination              UPPER EXTREMITY MMT:                       L not tested due to protocol   PALPATION:  TTP of the upper trap and supraspinatus region  OBSERVATION: Steri-strips covering the incision             TODAY'S TREATMENT:  [facility] Adult PT Treatment:                                                DATE: 12/09/21 Therapeutic Exercise: Shoulder pulleys flexion and scaption 2 min each Bilat Shoulder row 2x15 GTB Bilat Shoulder ext 2x15 GTB AROM L shoulder Shoulder ER 1x10 RTB Lt Shoulder IR 2x10 RTB Lt S/L left shoulder abduction x 10 S/L Left shoulder flexion x 10 S/L ER x 10 Supine shoulder flexion circles 1# clockwise and counter clockwise x 10 each  Modalities:        Vaso x 15 min c minimal compression, 36dF, left shoulder   [facility] Adult PT Treatment:                                                DATE: 12/02/2021 Therapeutic Exercise: Shoulder pulleys flexion and scaption 2 min each Bilat Shoulder row 2x15 RTB Bilat Shoulder ext 2x15 RTB Shoulder ER 1x10 RTB Lt Shoulder IR 2x10 RTB Lt S/L left shoulder abduction x 10 S/L Left shoulder flexion x 10 S/L ER x 10 Supine shoulder flexion circles 1# clockwise and counter clockwise x 10 each Modalities: Vaso x 15 mod minimal compression, 36dF, left shoulder  [facility] Adult PT Treatment:                                                DATE: 11/24/21 Therapeutic Exercise: Shoulder pulleys flexion and scaption 2 min each Bilat Shoulder row 2x15 RTB Bilat Shoulder ext 2x15 RTB Shoulder ER 1x10 RTB Shoulder IR 1x10 RTB S/L left shoulder abduction x 10 S/L Left shoulder flexion x 10 S/L ER x 10 Supine shoulder flexion circles # clockwise and counter clockwise x 10 each  Modalities: Vaso x 15 mod minimal compression, 36dF, left shoulder   PATIENT EDUCATION: Education details:  Eval findings, POC, HEP Person educated: Patient Education method: Explanation, Demonstration, Tactile cues, Verbal cues, and Handouts Education comprehension: verbalized understanding, returned demonstration, verbal cues required, and tactile cues required     HOME EXERCISE PROGRAM: Access Code: [email or web address] URL: [email or web address] Date: 11/19/2021 Prepared by: Gar Ponto  Exercises - Flexion-Extension Shoulder Pendulum with Table Support  - 2 x daily - 7 x weekly - 2 sets - 10 reps - Horizontal Shoulder Pendulum with Table Support  - 2 x daily - 7 x weekly - 2 sets - 10 reps - Seated Shoulder Flexion Slide at Table Top with Forearm in Neutral  - 2 x daily - 7 x weekly - 1 sets - 5 reps - 5 hold - Seated Shoulder Scaption Slide at Table Top with Forearm in Neutral  - 2 x daily - 7 x weekly - 1 sets - 5 reps - 5 hold - Seated Shoulder External Rotation PROM on Table  - 2 x daily - 7 x weekly - 1 sets - 5 reps - 5 hold - Isometric Shoulder Flexion at Wall  - 2 x daily - 7 x weekly -  1 sets - 5 reps - Isometric Shoulder Extension at Wall  - 2 x daily - 7 x weekly - 1 sets - 5 reps - 5 hold - Isometric Shoulder Abduction at Wall  - 2 x daily - 7 x weekly - 1 sets - 5 reps - 5 hold - Standing Isometric Shoulder External Rotation with Doorway  - 2 x daily - 7 x weekly - 1 sets - 5 reps - 5 hold - Standing Isometric Shoulder Internal Rotation at Doorway  - 2 x daily - 7 x weekly - 1 sets - 5 reps - 5 hold - Standing Shoulder Row with Anchored Resistance  - 2 x daily - 7 x weekly - 2 sets - 10 reps - 3 hold - Shoulder extension with resistance - Neutral  - 2 x daily - 7 x weekly - 3 sets - 10 reps - 3 hold - Shoulder External Rotation with Anchored Resistance  - 1 x daily - 7 x weekly - 2 sets - 10 reps - 3 hold - Shoulder Internal Rotation with Resistance (Mirrored)  - 1 x daily - 7 x weekly - 2 sets - 10 reps - 3 hold - Sleeper Stretch  - 1 x daily - 7 x weekly - 1 sets - 3 reps -  30 hold    ASSESSMENT:   CLINICAL IMPRESSION: Pt demonstrates her most functional use of her L shoulder for above shoulder movement with flexion. Shoulder abduction/scaption are painful and limited. Pt notes use of the L arm continues to make gradual improvements.PT continues to work on L shoulder strengthening to improve function with less pain.   OBJECTIVE IMPAIRMENTS decreased ROM, decreased strength, impaired UE functional use, postural dysfunction, and pain.    ACTIVITY LIMITATIONS carrying, lifting, sleeping, dressing, and reach over head   PARTICIPATION LIMITATIONS: meal prep, cleaning, laundry, driving, shopping, community activity, and yard work   PERSONAL FACTORS Past/current experiences, Time since onset of injury/illness/exacerbation, and 1 comorbidity: DM  are also affecting patient's functional outcome.    REHAB POTENTIAL: Good   CLINICAL DECISION MAKING: Stable/uncomplicated   EVALUATION COMPLEXITY: Low     GOALS:   SHORT TERM GOALS: Target date: 10/12/2021  (Remove Blue Hyperlink)   Pt will be Ind in an initial HEP Baseline:started on eval Goal status: MET   2.  Increase L shoulder flexion PROM to 125d and abd to 135d to progress to proper R shoulder function Baseline: flex= 105d and abd= 115d Goal status: MET   LONG TERM GOALS: Target date: 01/21/22   Increase L shoulder strength to 4 or > for improved R shoulder function Baseline: NT Status: 11/01/21: grossly 3/5  Goal status: Improving   2.  Increase L shoulder AROM to at least 85% of the R shoulder to complete above shoulder reaching  Baseline: See flow sheet Status; 11/18/21: Flexion 130d, abd 90d Goal status: Improving   3. Decrease L shoulder pain to 3/10 or less for improved L UE function and QOL  Baseline: 4-10/10 Status 8/10 Goal status: No improvement   4.  Pt will complete L shoulder above shoulder reaching c min to zero scapular shrug Baseline: unable Goal status: MET   5.  Pt will  score 27 or less with the UE Quickdash as indication of improved function Baseline: 53 Status:11/01/21: 43/55 Goal status: Improving    6.  Pt will be Ind in a final HEP to maintain achieved LOF and QOL Baselinestarted on eval  Goal status: INITIAL  PLAN: PT FREQUENCY: 1w8   PT DURATION: 8 weeks   PLANNED INTERVENTIONS: Therapeutic exercises, Therapeutic activity, Neuromuscular re-education, Balance training, Gait training, Patient/Family education, Joint mobilization, Aquatic Therapy, Dry Needling, Electrical stimulation, Cryotherapy, Moist heat, Taping, Vasopneumatic device, Ultrasound, Ionotophoresis 9m/ml Dexamethasone, Manual therapy, and Re-evaluation   PLAN FOR NEXT SESSION: strengthening as tolerated.    Lareina Espino MS, PT 12/09/21 1:03 PM

## 2021-12-09 ENCOUNTER — Ambulatory Visit: Payer: Medicaid Other

## 2021-12-09 DIAGNOSIS — G8929 Other chronic pain: Secondary | ICD-10-CM

## 2021-12-09 DIAGNOSIS — M25612 Stiffness of left shoulder, not elsewhere classified: Secondary | ICD-10-CM

## 2021-12-09 DIAGNOSIS — M25512 Pain in left shoulder: Secondary | ICD-10-CM | POA: Diagnosis not present

## 2021-12-09 DIAGNOSIS — M6281 Muscle weakness (generalized): Secondary | ICD-10-CM

## 2021-12-15 NOTE — Therapy (Signed)
OUTPATIENT PHYSICAL THERAPY TREATMENT NOTE/Re-Cert   Patient Name: [name] [name] MRN: [id number] DOB:[birth year], 61 y.o., female Today's Date: 12/16/2021  PCP: [name], MD  REFERRING PROVIDER: Meredith Pel, MD   END OF SESSION:   PT End of Session - 12/16/21 1201     Visit Number 17    Number of Visits 21    Date for PT Re-Evaluation 01/21/22    Authorization Type [place] MEDICAID [place] - Number of Visits 27    Progress Note Due on Visit 20    PT Start Time 1150    PT Stop Time 1240    PT Time Calculation (min) 50 min    Activity Tolerance Patient limited by pain;Patient tolerated treatment well    Behavior During Therapy [facility] for tasks assessed/performed               Past Medical History:  Diagnosis Date   Allergic rhinitis 05/09/2006   Allergy    Anxiety    Arthritis    Asthma    CHF (congestive heart failure) (HCC)    Chronic back pain    Diabetes mellitus without complication (HCC)    GERD (gastroesophageal reflux disease)    Heart murmur    as a child   Hepatitis C    genotype 1b, 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 s/p 12 week course of simeprevir and sofosbuvir between October 2014 and January 2015 with resolution.   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   Pneumonia    Prediabetes    Refusal of blood transfusions as patient is Jehovah's Witness    Secondary adrenal insufficiency ([place]) 06/29/2014   Urticaria    Past Surgical History:  Procedure Laterality Date   BUNIONECTOMY     b/l   COLECTOMY     2003 for diverticulitis, had colostomy bag and then reversed   COLONOSCOPY     HAND SURGERY     HAND SURGERY Right    right thumb surgery Dr Burney Gauze 2021   NASAL SINUS SURGERY     SHOULDER ARTHROSCOPY WITH OPEN ROTATOR CUFF REPAIR AND DISTAL CLAVICLE  ACROMINECTOMY Left 08/31/2021   Procedure: LEFT SHOULDER ARTHROSCOPY, DEBRIDEMENT,  ROTATOR CUFF TEAR REPAIR;  Surgeon: Meredith Pel, MD;  Location: [name];  Service: Orthopedics;  Laterality: Left;   SHOULDER SURGERY     left   TONSILLECTOMY     Patient Active Problem List   Diagnosis Date Noted   Synovitis of left shoulder    Complete tear of left rotator cuff    Acute otitis media with perforated tympanic membrane, right 08/03/2021   Impingement syndrome of left shoulder 07/01/2021   Left leg swelling 07/09/2020   Grief reaction 06/08/2020   Arm pain 02/10/2020   Dizziness on standing 12/27/2019   Generalized anxiety disorder with panic attacks 06/20/2019   Osteoarthritis of thumb, right 11/29/2018   Chronic migraine w/o aura w/o status migrainosus, not intractable 08/27/2018   Disorder of left eustachian tube 05/19/2017   Vertigo of central origin 01/23/2017   Central perforation of tympanic membrane of left ear 12/09/2016   Eustachian tube dysfunction, bilateral 06/15/2016   Ear pain, right 06/15/2016   Spondylosis of lumbar region without myelopathy or radiculopathy 10/08/2015   BPPV (benign paroxysmal positional vertigo)  10/02/2015   Liver fibrosis ([place]) 12/04/2014   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   Vitamin D deficiency 06/29/2014   Secondary adrenal insufficiency ([place]) 06/29/2014   History of tympanostomy tube placement 02/07/2014   Refusal of blood transfusions as patient is Jehovah's Witness 11/18/2013   Hepatitis C virus infection cured after antiviral drug therapy 03/16/2012   Health care maintenance 12/15/2011   Opioid dependence ([place]) 10/05/2010   HTN (hypertension) 10/05/2010   Hyperlipidemia associated with type 2 diabetes mellitus ([place]) 10/23/2008   HIV disease ([place]) 05/09/2006   Depression 05/09/2006   Allergic rhinitis 05/09/2006   GERD 05/09/2006    REFERRING DIAG: M79.602 (ICD-10-CM) -  Left arm pain.  Left shoulder arthroscopy with debridement and rotator cuff tear repair.  THERAPY DIAG:  Chronic left shoulder pain  Muscle weakness (generalized)  Stiffness of left shoulder, not elsewhere classified  Rationale for Evaluation and Treatment Rehabilitation  Onset: 08/31/21   SUBJECTIVE:  Pt reports her L shoulder has been sore the past 2 days without a known reason. Overall, she reports the use of her L shoulder/arm is much better.                                                                                                                                                                NPRS: 6810, 8/10 with reaching  Pain location: L shoulder  Pain description: ache , throb, sharp Aggravating factors: L shoulder movement Relieving factors: Ice man, rest, medications Pain range 6-8/10   PRECAUTIONS: Other: per Dr. Randel Pigg visit note, pt may DC sling with start of PT. Pt is not to completing lifting of the L arm  From: Donella Stade, PA-C  Should be good for full active range of motion and early rotator cuff strengthening exercises starting on 10/13/2021 which will be about 6 weeks out from surgery.     PERTINENT HISTORY: left shoulder arthroscopy debridement rotator cuff tear repair 08/31/21, arthritis, anxiety, CHF, DM   WEIGHT BEARING RESTRICTIONS No   FALLS:  Has patient fallen in last 6 months? No   LIVING ENVIRONMENT: Lives with: lives with their family and lives alone Lives in: House/apartment Pt is able to access and be mobile with in her home   OCCUPATION: Disabled   PLOF: Independent   PATIENT GOALS Use it with less pain, better use with cooking and cleaning   OBJECTIVE: (objective measures completed at initial evaluation unless otherwise dated)   DIAGNOSTIC FINDINGS:  L shoulder MRI 05/25/21 IMPRESSION: 1. Chronic calcific tendinosis of the posterior supraspinatus with moderate intermediate T2 signal and thickening tendinosis of the  mid to posterior supraspinatus and anterior infraspinatus. 2. Additional likely chronic calcific tendinosis of the superior subscapularis tendon along with a moderate partial-thickness  tear. 3. Status post distal clavicle excision and acromioplasty. 4. Mild subacromial/subdeltoid bursitis. 5. Mild-to-moderate glenohumeral cartilage degenerative changes   PATIENT SURVEYS:  Quick Dash 53/55. 10/20/21=43/55   COGNITION:           Overall cognitive status: Within functional limits for tasks assessed                                  SENSATION: WFL   POSTURE: Forward head c CT step off and dorsal hump, increased kyphosis, rounded shoulders,    UPPER EXTREMITY ROM:    Passive ROM Right 09/21/2021 Left 09/21/2021 LT 10/01/21 LT 10/11/21 LT AROM  10/25/21 LT AROM 11/02/21 LT 11/24/21 LT 12/02/21 LT 12/09/21  Shoulder flexion 160 105 d, limited by pain 125, limited by pain, empty endfeel 140, limited by pain c empty end feel 110 120 130 140d 140d  Shoulder extension   115, limited by pain         Shoulder abduction 165   115, limited by pain, empty end feel 130, limited by pain c empty end feel 90 90 90 90p!   Shoulder adduction             Shoulder internal rotation T7           Shoulder external rotation T6  45d Limited by pain 45d, not to exceed yet     T2 T2  Elbow flexion             Elbow extension             Wrist flexion             Wrist extension             Wrist ulnar deviation             Wrist radial deviation             Wrist pronation             Wrist supination             Empty end feel for PROM for L shoulder flexion, abd, and ER (Blank rows = not tested)  Passive ROM Right 10/2821 Rt      Shoulder flexion AROM 130 A145      Shoulder extension         Shoulder abduction AROM 90, limited by pin and weakness  A 90      Shoulder adduction          Shoulder internal rotation PROM 45d, limited by pain        Shoulder external rotation PROM, limited by pain        Elbow flexion          Elbow extension          Wrist flexion          Wrist extension          Wrist ulnar deviation          Wrist radial deviation          Wrist pronation          Wrist supination              UPPER EXTREMITY MMT:                       L not tested due to protocol   PALPATION:  TTP of the upper trap and supraspinatus region   OBSERVATION: Steri-strips covering the incision             TODAY'S TREATMENT:  [facility] Adult PT Treatment:                                                DATE: 12/16/21 Therapeutic Exercise: Shoulder pulleys flexion and scaption 2 min each Doorway pectoral stretch x3 30" SL sleeper stretch x3 30" SL ER 1# 2x10 SL abd to 90d x10 Bilat Shoulder row 2x15 GTB Bilat Shoulder ext 2x15 GTB Manual Therapy: STM to the ant and post [place] muscles Instruction and pt completing tennis ball/wall massage to the ant and post [place] muscles for home  Modalities: Vaso x 15 min c minimal compression, 36dF, left shoulder  [facility] Adult PT Treatment:                                                DATE: 12/09/21 Therapeutic Exercise: Shoulder pulleys flexion and scaption 2 min each Bilat Shoulder row 2x15 GTB Bilat Shoulder ext 2x15 GTB AROM L shoulder Shoulder ER 1x10 RTB Lt Shoulder IR 2x10 RTB Lt S/L left shoulder abduction x 10 S/L Left shoulder flexion x 10 S/L ER x 10 Supine shoulder flexion circles 1# clockwise and counter clockwise x 10 each  Modalities:        Vaso x 15 min c minimal compression, 36dF, left shoulder   [facility] Adult PT Treatment:                                                DATE: 12/02/2021 Therapeutic Exercise: Shoulder pulleys flexion and scaption 2 min each Bilat Shoulder row 2x15 RTB Bilat Shoulder ext 2x15 RTB Shoulder ER 1x10 RTB Lt Shoulder IR 2x10 RTB Lt S/L left shoulder abduction x 10 S/L Left shoulder flexion x 10 S/L ER x 10 Supine shoulder flexion circles 1# clockwise and counter clockwise x 10  each Modalities: Vaso x 15 mod minimal compression, 36dF, left shoulder Ionto 12m/ml 1 ml of dexamethasone to the anterior [place]. Pt is to wear for 6 hours ad not get wet. Pt advised re: possible skin reaction.    PATIENT EDUCATION: Education details: Eval findings, POC, HEP Person educated: Patient Education method: Explanation, Demonstration, Tactile cues, Verbal cues, and Handouts Education comprehension: verbalized understanding, returned demonstration, verbal cues required, and tactile cues required     HOME EXERCISE PROGRAM: Access Code: [email or web address] URL: [email or web address] Date: 11/19/2021 Prepared by: AGar Ponto Exercises - Flexion-Extension Shoulder Pendulum with Table Support  - 2 x daily - 7 x weekly - 2 sets - 10 reps - Horizontal Shoulder Pendulum with Table Support  - 2 x daily - 7 x weekly - 2 sets - 10 reps - Seated Shoulder Flexion Slide at Table Top with Forearm in Neutral  - 2 x daily - 7 x weekly - 1 sets - 5 reps - 5 hold - Seated Shoulder Scaption Slide at Table Top with Forearm in Neutral  - 2 x daily - 7 x weekly - 1  sets - 5 reps - 5 hold - Seated Shoulder External Rotation PROM on Table  - 2 x daily - 7 x weekly - 1 sets - 5 reps - 5 hold - Isometric Shoulder Flexion at Wall  - 2 x daily - 7 x weekly - 1 sets - 5 reps - Isometric Shoulder Extension at Wall  - 2 x daily - 7 x weekly - 1 sets - 5 reps - 5 hold - Isometric Shoulder Abduction at Wall  - 2 x daily - 7 x weekly - 1 sets - 5 reps - 5 hold - Standing Isometric Shoulder External Rotation with Doorway  - 2 x daily - 7 x weekly - 1 sets - 5 reps - 5 hold - Standing Isometric Shoulder Internal Rotation at Doorway  - 2 x daily - 7 x weekly - 1 sets - 5 reps - 5 hold - Standing Shoulder Row with Anchored Resistance  - 2 x daily - 7 x weekly - 2 sets - 10 reps - 3 hold - Shoulder extension with resistance - Neutral  - 2 x daily - 7 x weekly - 3 sets - 10 reps - 3 hold - Shoulder External  Rotation with Anchored Resistance  - 1 x daily - 7 x weekly - 2 sets - 10 reps - 3 hold - Shoulder Internal Rotation with Resistance (Mirrored)  - 1 x daily - 7 x weekly - 2 sets - 10 reps - 3 hold - Sleeper Stretch  - 1 x daily - 7 x weekly - 1 sets - 3 reps - 30 hold    ASSESSMENT:   CLINICAL IMPRESSION: L shoulder active flexion continues to make gradual gains, while abduction is limited to 90d with pt reporting pulling discomfort of the axilla with this motion. In addition to strengthening, pt focused of soft tissue mobility and ROM. Additionally, iontophoresis was provided to the anterior [facility] area for pain management. Pt tolerated the session without adverse effects. Pt will continue to benefit from skilled PT to address deficits to optimize L shoulder/UE function.  OBJECTIVE IMPAIRMENTS decreased ROM, decreased strength, impaired UE functional use, postural dysfunction, and pain.    ACTIVITY LIMITATIONS carrying, lifting, sleeping, dressing, and reach over head   PARTICIPATION LIMITATIONS: meal prep, cleaning, laundry, driving, shopping, community activity, and yard work   PERSONAL FACTORS Past/current experiences, Time since onset of injury/illness/exacerbation, and 1 comorbidity: DM  are also affecting patient's functional outcome.    REHAB POTENTIAL: Good   CLINICAL DECISION MAKING: Stable/uncomplicated   EVALUATION COMPLEXITY: Low     GOALS:   SHORT TERM GOALS: Target date: 10/12/2021  (Remove Blue Hyperlink)   Pt will be Ind in an initial HEP Baseline:started on eval Goal status: MET   2.  Increase L shoulder flexion PROM to 125d and abd to 135d to progress to proper R shoulder function Baseline: flex= 105d and abd= 115d Goal status: MET   LONG TERM GOALS: Target date: 01/21/22   Increase L shoulder strength to 4 or > for improved R shoulder function Baseline: NT Status: 11/01/21: grossly 3/5  Goal status: Improving   2.  Increase L shoulder AROM to at least 85% of  the R shoulder to complete above shoulder reaching  Baseline: See flow sheet Status; 11/18/21: Flexion 130d, abd 90d Goal status: Improving   3. Decrease L shoulder pain to 3/10 or less for improved L UE function and QOL  Baseline: 4-10/10 Status 8/10 Goal status: No improvement  4.  Pt will complete L shoulder above shoulder reaching c min to zero scapular shrug Baseline: unable Goal status: MET   5.  Pt will score 27 or less with the UE Quickdash as indication of improved function Baseline: 53 Status:11/01/21: 43/55 Goal status: Improving    6.  Pt will be Ind in a final HEP to maintain achieved LOF and QOL Baselinestarted on eval  Goal status: INITIAL     PLAN: PT FREQUENCY: 1w8   PT DURATION: 8 weeks   PLANNED INTERVENTIONS: Therapeutic exercises, Therapeutic activity, Neuromuscular re-education, Balance training, Gait training, Patient/Family education, Joint mobilization, Aquatic Therapy, Dry Needling, Electrical stimulation, Cryotherapy, Moist heat, Taping, Vasopneumatic device, Ultrasound, Ionotophoresis 46m/ml Dexamethasone, Manual therapy, and Re-evaluation   PLAN FOR NEXT SESSION: strengthening as tolerated.    Shakeela Rabadan MS, PT 12/16/21 5:57 PM

## 2021-12-16 ENCOUNTER — Ambulatory Visit: Payer: Medicaid Other

## 2021-12-16 DIAGNOSIS — M25512 Pain in left shoulder: Secondary | ICD-10-CM | POA: Diagnosis not present

## 2021-12-16 DIAGNOSIS — M25612 Stiffness of left shoulder, not elsewhere classified: Secondary | ICD-10-CM

## 2021-12-16 DIAGNOSIS — G8929 Other chronic pain: Secondary | ICD-10-CM

## 2021-12-16 DIAGNOSIS — M6281 Muscle weakness (generalized): Secondary | ICD-10-CM

## 2021-12-21 ENCOUNTER — Ambulatory Visit: Payer: Medicaid Other | Admitting: Student

## 2021-12-21 VITALS — BP 159/85 | HR 85 | Wt 180.7 lb

## 2021-12-21 DIAGNOSIS — M7989 Other specified soft tissue disorders: Secondary | ICD-10-CM | POA: Diagnosis not present

## 2021-12-21 DIAGNOSIS — Z87891 Personal history of nicotine dependence: Secondary | ICD-10-CM

## 2021-12-21 DIAGNOSIS — I10 Essential (primary) hypertension: Secondary | ICD-10-CM

## 2021-12-21 NOTE — Progress Notes (Signed)
CC: leg swelling  HPI:  [name] is a 61 y.o. person with secondary adrenal insufficiency, steroid-induced diabetes mellitus, well-controlled HIV presenting to [facility] for left leg swelling.  Please see problem-based list for further details, assessments, and plans.  Past Medical History:  Diagnosis Date   Allergic rhinitis 05/09/2006   Allergy    Anxiety    Arthritis    Asthma    CHF (congestive heart failure) (HCC)    Chronic back pain    Diabetes mellitus without complication (HCC)    GERD (gastroesophageal reflux disease)    Heart murmur    as a child   Hepatitis C    genotype 1b, 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 s/p 12 week course of simeprevir and sofosbuvir between October 2014 and January 2015 with resolution.   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 No murmurs appreciated. Distal pulses 2+ bialterally.  Pulm: Normal work of breathing on room air. Clear to ausculation bilaterally. MSK: Normal bulk, tone. Non-pitting edema on left appreciated to mid-calf. Trace edema to ankle on right. Stemmer sign negative on left and right.  Skin: Increased superficial veins appreciated on bilateral feet. No areas of erythema on lower extremities. Neuro: Awake, alert, conversing appropriately. Grossly non-focal. Psych: Normal mood, affect, speech.  Assessment & Plan:   HTN (hypertension) BP in office today 159/85. She does not have any  new symptoms, denying chest pain, dyspnea, lightheadedness. After previously being well-controlled it appears her pressures have mildly increased over the last two office visits, although her pressures sound more normal at home. She is currently taking amlodipine and metoprolol daily.   Discussed with [name] that we can continue with her current regimen since her home pressures have been more normal. However, we can consider decreasing dose of amlodipine in the future to potentially help decrease lower extremity swelling. If so, will need to adjust her anti-hypertensives. Will continue with same regimen and have her follow-up in three months.  - Metoprolol tartrate '25mg'$  twice daily - Amlodipine '10mg'$  daily - Can consider decreasing amlodipine if lower extremity swelling persists - BMP today  Left leg swelling [name] is presenting today for continued left lower extremity swelling. She states that this has been an issue for an extended period of time, but it has worsened recently. She was previously able to elevate her legs and the swelling would decrease. However, more recently the swelling has persisted. She denies any pain in her legs, including when she walks. She denies any dyspnea, chest pain, abdominal pain, changes in urine, increased urinary frequency.   [name] has had extensive work-up for this issue - there has not been any evidence of peripheral vascular disease, congestive heart failure, lymphedema, or nephrotic syndrome. It was also noted that there were increased superficial veins in her bilateral  lower extremities. Discussed with [name] that most likely her symptoms are due to venous insufficiency. We went over the disease process and treatment options. Will continue with leg elevation and compression for this issue. She will return to clinic if symptoms worsen or do not improve. At next visit, could consider adjusting amlodipine for symptom relief.  - Continue leg  elevation, compression - Consider decreasing amlodipine at next visit   Patient discussed with Dr. Lise Auer, MD Internal Medicine PGY-3 Pager: [phone]

## 2021-12-21 NOTE — Patient Instructions (Signed)
[name] [name], it was a pleasure seeing you today!  Today we discussed: - Leg swelling: I think this is due to your veins. Continue elevating your legs and using compression stockings.  - We can consider decreasing your amlodipine to see if this helps as well.   Follow-up: 3 months   Please make sure to arrive 15 minutes prior to your next appointment. If you arrive late, you may be asked to reschedule.   We look forward to seeing you next time. Please call our clinic at [phone] if you have any questions or concerns. The best time to call is Monday-Friday from 9am-4pm, but there is someone available 24/7. If after hours or the weekend, call the main hospital number and ask for the Internal Medicine Resident On-Call. If you need medication refills, please notify your pharmacy one week in advance and they will send [place] a request.  Thank you for letting us take part in your care. Wishing you the best!  Thank you, [name], MD

## 2021-12-21 NOTE — Therapy (Signed)
OUTPATIENT PHYSICAL THERAPY TREATMENT NOTE/Re-Cert   Patient Name: [name] [name] MRN: [id number] DOB:08-Jun-1960, 61 y.o., female Today's Date: 12/21/2021  PCP: [name], MD  REFERRING PROVIDER: Meredith Pel, MD   END OF SESSION:       Past Medical History:  Diagnosis Date   Allergic rhinitis 05/09/2006   Allergy    Anxiety    Arthritis    Asthma    CHF (congestive heart failure) ([place])    Chronic back pain    Diabetes mellitus without complication ([place])    GERD (gastroesophageal reflux disease)    Heart murmur    as a child   Hepatitis C    genotype 1b, 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 s/p 12 week course of simeprevir and sofosbuvir between October 2014 and January 2015 with resolution.   History of shingles    HIV infection ([place])    1994   Hyperlipidemia    no meds taken now   Hypertension    Migraine    Pituitary microadenoma ([place]) 08/08/2014   Pneumonia    Prediabetes    Refusal of blood transfusions as patient is Jehovah's Witness    Secondary adrenal insufficiency ([place]) 06/29/2014   Urticaria    Past Surgical History:  Procedure Laterality Date   BUNIONECTOMY     b/l   COLECTOMY     2003 for diverticulitis, had colostomy bag and then reversed   COLONOSCOPY     HAND SURGERY     HAND SURGERY Right    right thumb surgery Dr Burney Gauze 2021   NASAL SINUS SURGERY     SHOULDER ARTHROSCOPY WITH OPEN ROTATOR CUFF REPAIR AND DISTAL CLAVICLE ACROMINECTOMY Left 08/31/2021   Procedure: LEFT SHOULDER ARTHROSCOPY, DEBRIDEMENT,  ROTATOR CUFF TEAR REPAIR;  Surgeon: Meredith Pel, MD;  Location: [facility];  Service: Orthopedics;  Laterality: Left;   SHOULDER SURGERY     left   TONSILLECTOMY     Patient Active Problem List   Diagnosis Date Noted   Synovitis of left shoulder    Complete tear of left rotator cuff    Acute otitis media with perforated tympanic membrane, right 08/03/2021   Impingement syndrome of left shoulder 07/01/2021    Left leg swelling 07/09/2020   Grief reaction 06/08/2020   Arm pain 02/10/2020   Dizziness on standing 12/27/2019   Generalized anxiety disorder with panic attacks 06/20/2019   Osteoarthritis of thumb, right 11/29/2018   Chronic migraine w/o aura w/o status migrainosus, not intractable 08/27/2018   Disorder of left eustachian tube 05/19/2017   Vertigo of central origin 01/23/2017   Central perforation of tympanic membrane of left ear 12/09/2016   Eustachian tube dysfunction, bilateral 06/15/2016   Ear pain, right 06/15/2016   Spondylosis of lumbar region without myelopathy or radiculopathy 10/08/2015   BPPV (benign paroxysmal positional vertigo) 10/02/2015   Liver fibrosis ([place]) 12/04/2014   Pituitary microadenoma ([place]) 08/08/2014   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   Vitamin D deficiency 06/29/2014   Secondary adrenal insufficiency ([place]) 06/29/2014   History of tympanostomy tube placement 02/07/2014   Refusal of blood transfusions as patient is Jehovah's Witness 11/18/2013   Hepatitis C virus infection cured after antiviral drug therapy 03/16/2012   Health care maintenance 12/15/2011   Opioid dependence ([place]) 10/05/2010   HTN (hypertension) 10/05/2010   Hyperlipidemia associated with type 2 diabetes mellitus ([place]) 10/23/2008   HIV disease ([place]) 05/09/2006   Depression 05/09/2006   Allergic rhinitis 05/09/2006   GERD  05/09/2006    REFERRING DIAG: M78.675 (ICD-10-CM) - Left arm pain.  Left shoulder arthroscopy with debridement and rotator cuff tear repair.  THERAPY DIAG:  No diagnosis found.  Rationale for Evaluation and Treatment Rehabilitation  Onset: 08/31/21   SUBJECTIVE:  Pt reports her L shoulder has been sore the past 2 days without a known reason. Overall, she reports the use of her L shoulder/arm is much better.                                                                                                                                                                 NPRS: 6810, 8/10 with reaching  Pain location: L shoulder  Pain description: ache , throb, sharp Aggravating factors: L shoulder movement Relieving factors: Ice man, rest, medications Pain range 6-8/10   PRECAUTIONS: Other: per Dr. Randel Pigg visit note, pt may DC sling with start of PT. Pt is not to completing lifting of the L arm  From: Donella Stade, PA-C  Should be good for full active range of motion and early rotator cuff strengthening exercises starting on 10/13/2021 which will be about 6 weeks out from surgery.     PERTINENT HISTORY: left shoulder arthroscopy debridement rotator cuff tear repair 08/31/21, arthritis, anxiety, CHF, DM   WEIGHT BEARING RESTRICTIONS No   FALLS:  Has patient fallen in last 6 months? No   LIVING ENVIRONMENT: Lives with: lives with their family and lives alone Lives in: House/apartment Pt is able to access and be mobile with in her home   OCCUPATION: Disabled   PLOF: Independent   PATIENT GOALS Use it with less pain, better use with cooking and cleaning   OBJECTIVE: (objective measures completed at initial evaluation unless otherwise dated)   DIAGNOSTIC FINDINGS:  L shoulder MRI 05/25/21 IMPRESSION: 1. Chronic calcific tendinosis of the posterior supraspinatus with moderate intermediate T2 signal and thickening tendinosis of the mid to posterior supraspinatus and anterior infraspinatus. 2. Additional likely chronic calcific tendinosis of the superior subscapularis tendon along with a moderate partial-thickness tear. 3. Status post distal clavicle excision and acromioplasty. 4. Mild subacromial/subdeltoid bursitis. 5. Mild-to-moderate glenohumeral cartilage degenerative changes   PATIENT SURVEYS:  Quick Dash 53/55. 10/20/21=43/55   COGNITION:           Overall cognitive status: Within functional limits for tasks assessed                                  SENSATION: WFL   POSTURE: Forward head c CT step off  and dorsal hump, increased kyphosis, rounded shoulders,    UPPER EXTREMITY ROM:    Passive ROM Right 09/21/2021 Left 09/21/2021 LT 10/01/21 LT 10/11/21 LT AROM  10/25/21  LT AROM 11/02/21 LT 11/24/21 LT 12/02/21 LT 12/09/21  Shoulder flexion 160 105 d, limited by pain 125, limited by pain, empty endfeel 140, limited by pain c empty end feel 110 120 130 140d 140d  Shoulder extension   115, limited by pain         Shoulder abduction 165   115, limited by pain, empty end feel 130, limited by pain c empty end feel 90 90 90 90p!   Shoulder adduction             Shoulder internal rotation T7           Shoulder external rotation T6  45d Limited by pain 45d, not to exceed yet     T2 T2  Elbow flexion             Elbow extension             Wrist flexion             Wrist extension             Wrist ulnar deviation             Wrist radial deviation             Wrist pronation             Wrist supination             Empty end feel for PROM for L shoulder flexion, abd, and ER (Blank rows = not tested)  Passive ROM Right 10/2821 Rt      Shoulder flexion AROM 130 A145      Shoulder extension         Shoulder abduction AROM 90, limited by pin and weakness  A 90      Shoulder adduction          Shoulder internal rotation PROM 45d, limited by pain        Shoulder external rotation PROM, limited by pain       Elbow flexion          Elbow extension          Wrist flexion          Wrist extension          Wrist ulnar deviation          Wrist radial deviation          Wrist pronation          Wrist supination              UPPER EXTREMITY MMT:                       L not tested due to protocol   PALPATION:  TTP of the upper trap and supraspinatus region   OBSERVATION: Steri-strips covering the incision             TODAY'S TREATMENT:  [facility] Adult PT Treatment:                                                DATE: 12/22/21 Therapeutic Exercise: Shoulder pulleys flexion and scaption 2 min  each Doorway pectoral stretch x3 30" SL sleeper stretch x3 30" SL ER 1# 2x10 SL abd to 90d x10 Bilat Shoulder row 2x15 GTB Bilat Shoulder ext 2x15 GTB Manual  Therapy: STM to the ant and post [place] muscles Instruction and pt completing tennis ball/wall massage to the ant and post [place] muscles for home  Modalities: Vaso x 15 min c minimal compression, 36dF, left shoulder  [facility] Adult PT Treatment:                                                DATE: 12/16/21 Therapeutic Exercise: Shoulder pulleys flexion and scaption 2 min each Doorway pectoral stretch x3 30" SL sleeper stretch x3 30" SL ER 1# 2x10 SL abd to 90d x10 Bilat Shoulder row 2x15 GTB Bilat Shoulder ext 2x15 GTB Manual Therapy: STM to the ant and post [place] muscles Instruction and pt completing tennis ball/wall massage to the ant and post [place] muscles for home  Modalities: Vaso x 15 min c minimal compression, 36dF, left shoulder  [facility] Adult PT Treatment:                                                DATE: 12/09/21 Therapeutic Exercise: Shoulder pulleys flexion and scaption 2 min each Bilat Shoulder row 2x15 GTB Bilat Shoulder ext 2x15 GTB AROM L shoulder Shoulder ER 1x10 RTB Lt Shoulder IR 2x10 RTB Lt S/L left shoulder abduction x 10 S/L Left shoulder flexion x 10 S/L ER x 10 Supine shoulder flexion circles 1# clockwise and counter clockwise x 10 each  Modalities:        Vaso x 15 min c minimal compression, 36dF, left shoulder   [facility] Adult PT Treatment:                                                DATE: 12/02/2021 Therapeutic Exercise: Shoulder pulleys flexion and scaption 2 min each Bilat Shoulder row 2x15 RTB Bilat Shoulder ext 2x15 RTB Shoulder ER 1x10 RTB Lt Shoulder IR 2x10 RTB Lt S/L left shoulder abduction x 10 S/L Left shoulder flexion x 10 S/L ER x 10 Supine shoulder flexion circles 1# clockwise and counter clockwise x 10 each Modalities: Vaso x 15 mod minimal compression, 36dF, left shoulder I[place]  23m/ml 1 ml of dexamethasone to the anterior [place]. Pt is to wear for 6 hours ad not get wet. Pt advised re: possible skin reaction.    PATIENT EDUCATION: Education details: Eval findings, POC, HEP Person educated: Patient Education method: Explanation, Demonstration, Tactile cues, Verbal cues, and Handouts Education comprehension: verbalized understanding, returned demonstration, verbal cues required, and tactile cues required     HOME EXERCISE PROGRAM: Access Code: [email or web address] URL: [email or web address] Date: 11/19/2021 Prepared by: AGar Ponto Exercises - Flexion-Extension Shoulder Pendulum with Table Support  - 2 x daily - 7 x weekly - 2 sets - 10 reps - Horizontal Shoulder Pendulum with Table Support  - 2 x daily - 7 x weekly - 2 sets - 10 reps - Seated Shoulder Flexion Slide at Table Top with Forearm in Neutral  - 2 x daily - 7 x weekly - 1 sets - 5 reps - 5 hold - Seated Shoulder Scaption Slide at Table Top with Forearm in Neutral  - 2  x daily - 7 x weekly - 1 sets - 5 reps - 5 hold - Seated Shoulder External Rotation PROM on Table  - 2 x daily - 7 x weekly - 1 sets - 5 reps - 5 hold - Isometric Shoulder Flexion at Wall  - 2 x daily - 7 x weekly - 1 sets - 5 reps - Isometric Shoulder Extension at Wall  - 2 x daily - 7 x weekly - 1 sets - 5 reps - 5 hold - Isometric Shoulder Abduction at Wall  - 2 x daily - 7 x weekly - 1 sets - 5 reps - 5 hold - Standing Isometric Shoulder External Rotation with Doorway  - 2 x daily - 7 x weekly - 1 sets - 5 reps - 5 hold - Standing Isometric Shoulder Internal Rotation at Doorway  - 2 x daily - 7 x weekly - 1 sets - 5 reps - 5 hold - Standing Shoulder Row with Anchored Resistance  - 2 x daily - 7 x weekly - 2 sets - 10 reps - 3 hold - Shoulder extension with resistance - Neutral  - 2 x daily - 7 x weekly - 3 sets - 10 reps - 3 hold - Shoulder External Rotation with Anchored Resistance  - 1 x daily - 7 x weekly - 2 sets - 10 reps - 3  hold - Shoulder Internal Rotation with Resistance (Mirrored)  - 1 x daily - 7 x weekly - 2 sets - 10 reps - 3 hold - Sleeper Stretch  - 1 x daily - 7 x weekly - 1 sets - 3 reps - 30 hold    ASSESSMENT:   CLINICAL IMPRESSION: L shoulder active flexion continues to make gradual gains, while abduction is limited to 90d with pt reporting pulling discomfort of the axilla with this motion. In addition to strengthening, pt focused of soft tissue mobility and ROM. Additionally, iontophoresis was provided to the anterior [facility] area for pain management. Pt tolerated the session without adverse effects. Pt will continue to benefit from skilled PT to address deficits to optimize L shoulder/UE function.  OBJECTIVE IMPAIRMENTS decreased ROM, decreased strength, impaired UE functional use, postural dysfunction, and pain.    ACTIVITY LIMITATIONS carrying, lifting, sleeping, dressing, and reach over head   PARTICIPATION LIMITATIONS: meal prep, cleaning, laundry, driving, shopping, community activity, and yard work   PERSONAL FACTORS Past/current experiences, Time since onset of injury/illness/exacerbation, and 1 comorbidity: DM  are also affecting patient's functional outcome.    REHAB POTENTIAL: Good   CLINICAL DECISION MAKING: Stable/uncomplicated   EVALUATION COMPLEXITY: Low     GOALS:   SHORT TERM GOALS: Target date: 10/12/2021   Pt will be Ind in an initial HEP Baseline:started on eval Goal status: MET   2.  Increase L shoulder flexion PROM to 125d and abd to 135d to progress to proper R shoulder function Baseline: flex= 105d and abd= 115d Goal status: MET   LONG TERM GOALS: Target date: 01/21/22   Increase L shoulder strength to 4 or > for improved R shoulder function Baseline: NT Status: 11/01/21: grossly 3/5  Goal status: Improving   2.  Increase L shoulder AROM to at least 85% of the R shoulder to complete above shoulder reaching  Baseline: See flow sheet Status; 11/18/21: Flexion  130d, abd 90d Goal status: Improving   3. Decrease L shoulder pain to 3/10 or less for improved L UE function and QOL  Baseline: 4-10/10 Status 8/10 Goal  status: No improvement   4.  Pt will complete L shoulder above shoulder reaching c min to zero scapular shrug Baseline: unable Goal status: MET   5.  Pt will score 27 or less with the UE Quickdash as indication of improved function Baseline: 53 Status:11/01/21: 43/55 Goal status: Improving    6.  Pt will be Ind in a final HEP to maintain achieved LOF and QOL Baselinestarted on eval  Goal status: INITIAL     PLAN: PT FREQUENCY: 1w8   PT DURATION: 8 weeks   PLANNED INTERVENTIONS: Therapeutic exercises, Therapeutic activity, Neuromuscular re-education, Balance training, Gait training, Patient/Family education, Joint mobilization, Aquatic Therapy, Dry Needling, Electrical stimulation, Cryotherapy, Moist heat, Taping, Vasopneumatic device, Ultrasound, Ionotophoresis 76m/ml Dexamethasone, Manual therapy, and Re-evaluation   PLAN FOR NEXT SESSION: strengthening as tolerated.    Janeth Terry MS, PT 12/21/21 9:44 PM

## 2021-12-22 ENCOUNTER — Ambulatory Visit: Payer: Medicaid Other

## 2021-12-22 DIAGNOSIS — G8929 Other chronic pain: Secondary | ICD-10-CM

## 2021-12-22 DIAGNOSIS — M6281 Muscle weakness (generalized): Secondary | ICD-10-CM

## 2021-12-22 DIAGNOSIS — M25512 Pain in left shoulder: Secondary | ICD-10-CM | POA: Diagnosis not present

## 2021-12-22 DIAGNOSIS — M25612 Stiffness of left shoulder, not elsewhere classified: Secondary | ICD-10-CM

## 2021-12-22 LAB — BMP8+ANION GAP
Anion Gap: 17 mmol/L (ref 10.0–18.0)
BUN/Creatinine Ratio: 15 (ref 12–28)
BUN: 15 mg/dL (ref 8–27)
CO2: 22 mmol/L (ref 20–29)
Calcium: 9.7 mg/dL (ref 8.7–10.3)
Chloride: 103 mmol/L (ref 96–106)
Creatinine, Ser: 1.02 mg/dL — ABNORMAL HIGH (ref 0.57–1.00)
Glucose: 99 mg/dL (ref 70–99)
Potassium: 3.9 mmol/L (ref 3.5–5.2)
Sodium: 142 mmol/L (ref 134–144)
eGFR: 63 mL/min/{1.73_m2} (ref >59)

## 2021-12-23 ENCOUNTER — Encounter: Payer: Self-pay | Admitting: Physical Medicine & Rehabilitation

## 2021-12-23 ENCOUNTER — Encounter: Payer: Medicaid Other | Attending: Physical Medicine & Rehabilitation | Admitting: Physical Medicine & Rehabilitation

## 2021-12-23 VITALS — BP 145/89 | HR 53 | Temp 98.2°F | Ht 61.0 in | Wt 178.6 lb

## 2021-12-23 DIAGNOSIS — M461 Sacroiliitis, not elsewhere classified: Secondary | ICD-10-CM | POA: Insufficient documentation

## 2021-12-23 MED ORDER — LIDOCAINE HCL (PF) 2 % IJ SOLN
4.0000 mL | Freq: Once | INTRAMUSCULAR | Status: AC
  Administered 2021-12-23: 4 mL

## 2021-12-23 MED ORDER — LIDOCAINE HCL (PF) 1 % IJ SOLN
5.0000 mL | Freq: Once | INTRAMUSCULAR | Status: AC
  Administered 2021-12-23: 5 mL

## 2021-12-23 MED ORDER — BETAMETHASONE SOD PHOS & ACET 6 (3-3) MG/ML IJ SUSP
6.0000 mg | Freq: Once | INTRAMUSCULAR | Status: AC
  Administered 2021-12-23: 6 mg via INTRAMUSCULAR

## 2021-12-23 NOTE — Progress Notes (Signed)
Bilateral sacroiliac injections under fluoroscopic guidance  Indication: Low back and buttocks pain not relieved by medication management and other conservative care.Has had failure of PT and OTC meds.  Hx HIV, Last CD4 Oct 2022  608  Informed consent was obtained after describing risks and benefits of the procedure with the patient, this includes bleeding, bruising, infection, paralysis and medication side effects. The patient wishes to proceed and has given written consent. The patient was placed in a prone position. The lumbar and sacral area was marked and prepped with Betadine. A 25-gauge 1-1/2 inch needle was inserted into the skin and subcutaneous tissue and 1 mL of 1% lidocaine was injected into each side. Then a 25-gauge 3 inch spinal needle was inserted under fluoroscopic guidance into the left sacroiliac joint. AP and lateral images were utilized. Omnipaque 180x0.5 mL under live fluoroscopy demonstrated no intravascular uptake. Then a solution containing one ML of 6 mg per mL Celestone in 2 ML of 2% lidocaine MPF was injected x1.5 mL. This same procedure was repeated on the right side using the same needle, injectate, and technique. Patient tolerated the procedure well. Post procedure instructions were given. Please see post procedure form.

## 2021-12-23 NOTE — Progress Notes (Signed)
  PROCEDURE RECORD [facility]   Name: [name] [name] DOB:1961-02-06 MRN: [id number]  Date:12/23/2021  Physician: Alysia Penna, MD    Nurse/CMA: Kristine Garbe R RMA  Allergies:  Allergies  Allergen Reactions   Acetaminophen Other (See Comments)    Inflamed liver, hospitalized     Morphine Sulfate Hives and Shortness Of Breath   Triamterene Hives   Aspirin-Caffeine Other (See Comments)    liver damage; upset stomach   Dyazide [Hydrochlorothiazide W-Triamterene] Hives   Jardiance [Empagliflozin] Diarrhea    (Jardiance) stomach ache    Lisinopril     Cough    Metformin Hcl Er Other (See Comments)     upset stomach    Topamax [Topiramate] Other (See Comments)    Vision disturbances.   Citalopram Itching and Rash   Emtricitabine-Tenofovir Df Rash     Descovy   Losartan Nausea Only and Rash    Pt had rash, worsening dizziness, and nausea after starting losartan, which improved after stopping losartan   Triamterene-Hctz Rash    Consent Signed: Yes.    Is patient diabetic? Yes.    CBG today? 43  Pregnant: No. LMP: No LMP recorded. Patient is postmenopausal. (age 30-55)  Anticoagulants: no Anti-inflammatory: no Antibiotics: no  Procedure: Bilateral Sacroiliac Injections  Position: Prone Start Time: 12:48PM  End Time: 12:54PM  Fluoro Time: 25  RN/CMA [place] R RMA Kristine Garbe R RMA    Time 12:31PM 12:54PM    BP 145/89 160/96    Pulse 82 86    Respirations 16 16    O2 Sat 97 93    S/S 6 6    Pain Level 10 6     D/C home with [name], [name] & O X 3, D/C instructions reviewed, and sits independently.

## 2021-12-23 NOTE — Patient Instructions (Signed)
Sacroiliac injection was performed today. A combination of numbing medicine (lidocaine) plus a cortisone medicine (betamethasone) was injected. The injection was done under x-ray guidance. This procedure has been performed to help reduce low back and buttocks pain as well as potentially hip pain. The duration of this injection is variable lasting from hours to  Months. It may repeated if needed. 

## 2021-12-24 NOTE — Assessment & Plan Note (Addendum)
Ms. Sivertson is presenting today for continued left lower extremity swelling. She states that this has been an issue for an extended period of time, but it has worsened recently. She was previously able to elevate her legs and the swelling would decrease. However, more recently the swelling has persisted. She denies any pain in her legs, including when she walks. She denies any dyspnea, chest pain, abdominal pain, changes in urine, increased urinary frequency.   Ms. Dike has had extensive work-up for this issue - there has not been any evidence of peripheral vascular disease, congestive heart failure, lymphedema, or nephrotic syndrome. It was also noted that there were increased superficial veins in her bilateral lower extremities. Discussed with Ms. Vasques that most likely her symptoms are due to venous insufficiency. We went over the disease process and treatment options. Will continue with leg elevation and compression for this issue. She will return to clinic if symptoms worsen or do not improve. At next visit, could consider adjusting amlodipine for symptom relief.  - Continue leg elevation, compression - Consider decreasing amlodipine at next visit

## 2021-12-24 NOTE — Assessment & Plan Note (Addendum)
BP in office today 159/85. She does not have any new symptoms, denying chest pain, dyspnea, lightheadedness. After previously being well-controlled it appears her pressures have mildly increased over the last two office visits, although her pressures sound more normal at home. She is currently taking amlodipine and metoprolol daily.   Discussed with [name] [name] that we can continue with her current regimen since her home pressures have been more normal. However, we can consider decreasing dose of amlodipine in the future to potentially help decrease lower extremity swelling. If so, will need to adjust her anti-hypertensives. Will continue with same regimen and have her follow-up in three months.  - Metoprolol tartrate '25mg'$  twice daily - Amlodipine '10mg'$  daily - Can consider decreasing amlodipine if lower extremity swelling persists - BMP today

## 2021-12-24 NOTE — Progress Notes (Signed)
Internal Medicine Clinic Attending ? ?Case discussed with Dr. Braswell  At the time of the visit.  We reviewed the resident?s history and exam and pertinent patient test results.  I agree with the assessment, diagnosis, and plan of care documented in the resident?s note.  ?

## 2021-12-28 ENCOUNTER — Other Ambulatory Visit: Payer: Self-pay | Admitting: Obstetrics and Gynecology

## 2021-12-28 NOTE — Patient Instructions (Signed)
Hi [name] [name], always a pleasure speaking with you-have a wonderful week!!  [name] [name] was given information about Medicaid Managed Care team care coordination services as a part of their [place] Medicaid benefit. [name] [name] verbally consented to engagement with the [facility] Managed Care team.   If you are experiencing a medical emergency, please call 911 or report to your local emergency department or urgent care.   If you have a non-emergency medical problem during routine business hours, please contact your provider's office and ask to speak with a nurse.   For questions related to your [facility], please call: [phone] or visit the homepage here: [email or web address]  If you would like to schedule transportation through your [facility], please call the following number at least 2 days in advance of your appointment: [phone]   Rides for urgent appointments can also be made after hours by calling Member Services.  Call the [place] at [phone], at any time, 24 hours a day, 7 days a week. If you are in danger or need immediate medical attention call 911.  If you would like help to quit smoking, call 1-800-QUIT-NOW (629)704-0498) OR Espaol: 1-855-Djelo-Ya (1-324-401-0272) o para ms informacin haga clic aqu or Text READY to 200-400 to register via text  [name] [name] - following are the goals we discussed in your visit today:   Goals Addressed             This Visit's Progress    Protect My Health       Timeframe:  Long-Range Goal Priority:  Medium Start Date:      05/07/20                       Expected End Date: ongoing             Follow Up Date: 01/31/22 - schedule appointment for vaccines needed due to my age or health - schedule recommended health tests (blood work, mammogram, colonoscopy, pap test) - schedule  and keep appointment for annual check-up    Why is this important?   Screening tests can find diseases early when they are easier to treat.  Your doctor or nurse will talk with you about which tests are important for you.  Getting shots for common diseases like the flu and shingles will help prevent them.    12/28/21:  PT once a week for 8 more weeks, Podiatry appt 12/31/21   Patient verbalizes understanding of instructions and care plan provided today and agrees to view in [place]. Active MyChart status and patient understanding of how to access instructions and care plan via MyChart confirmed with patient.     The Managed Medicaid care management team will reach out to the patient again over the next 30 business  days.  The  Patient  has been provided with contact information for the Managed Medicaid care management team and has been advised to call with any health related questions or concerns.   [name] Raider RN, BSN [place] Management Coordinator - Managed Medicaid High Risk [phone]   Following is a copy of your plan of care:  Care Plan : General Plan of Care (Adult)  Updates made by [name] Medicus, RN since 12/28/2021 12:00 AM     Problem: Health Promotion or Disease Self-Management (General Plan of Care)   Priority: Medium  Onset Date: 08/10/2020  Long-Range Goal: Self-Management Plan Developed   Start Date: 05/07/2020  Expected End Date: 02/18/2022  Recent Progress: On track  Priority: Medium  Note:   Current Barriers:  Chronic Disease Management support and education needs.  12/28/21:  Patient continues with PT for 8 more weeks, slow, steady progress per patient.  Awaiting hearing aid for left ear.  Blood sugars and BP WNL.    Nurse Case Manager Clinical Goal(s):  Over the next 30 days, patient will attend all scheduled medical appointments  Interventions:  Inter-disciplinary care team collaboration (see longitudinal plan of  care) Evaluation of current treatment plan and patient's adherence to plan as established by provider. Reviewed medications with patient. Collaborated with pharmacy regarding medications. Discussed plans with patient for ongoing care management follow up and provided patient with direct contact information for care management team Reviewed scheduled/upcoming provider appointments. Pharmacy referral for medication review-completed. Patient provided with [facility] transportation information.  Patient Goals/Self-Care Activities Over the next 30 days, patient will:  -Attends all scheduled provider appointments Calls pharmacy for medication refills Calls provider office for new concerns or questions  Follow Up Plan: The Managed Medicaid care management team will reach out to the patient again over the next 30 business  days.  The patient has been provided with contact information for the Managed Medicaid care management team and has been advised to call with any health related questions or concerns.

## 2021-12-28 NOTE — Patient Outreach (Signed)
Medicaid Managed Care   Nurse Care Manager Note  12/28/2021 Name:  [name] [name] MRN:  [id number] DOB:  1960/06/24  [name] [name] is an 61 y.o. year old female who is a primary patient of [name] Dame, MD.  The [facility] Managed Care Coordination team was consulted for assistance with:    Chronic healthcare management needs, DM, HTN, asthma, GERD, anxiety, osteoarthritis, HIV  [name] [name] was given information about Medicaid Managed Care Coordination team services today. [name] [name] Patient agreed to services and verbal consent obtained.  Engaged with patient by telephone for follow up visit in response to provider referral for case management and/or care coordination services.   Assessments/Interventions:  Review of past medical history, allergies, medications, health status, including review of consultants reports, laboratory and other test data, was performed as part of comprehensive evaluation and provision of chronic care management services.  SDOH (Social Determinants of Health) assessments and interventions performed: SDOH Interventions    Flowsheet Row Patient Outreach Telephone from 12/28/2021 in [place] Patient Outreach Telephone from 11/18/2021 in [place] Patient Outreach Telephone from 11/09/2021 in [place] Patient Outreach Telephone from 10/29/2021 in [place] Patient Outreach Telephone from 10/28/2021 in [employer] Patient Outreach Telephone from 10/27/2021 in [place] Coordination  SDOH Interventions        Food Insecurity Interventions Intervention Not Indicated -- -- -- -- Intervention Not Indicated  Housing Interventions -- -- -- Intervention Not Indicated -- --  Transportation Interventions -- -- --  [facility] transportation] -- -- --   Utilities Interventions Intervention Not Indicated -- -- -- -- --  Financial Strain Interventions -- -- -- -- Intervention Not Indicated --  Physical Activity Interventions -- -- -- -- Other (Comments)  [s/p shoulder surgery, attending PT] --  Stress Interventions -- Intervention Not Indicated -- -- -- --      Care Plan  Allergies  Allergen Reactions   Acetaminophen Other (See Comments)    Inflamed liver, hospitalized     Morphine Sulfate Hives and Shortness Of Breath   Triamterene Hives   Aspirin-Caffeine Other (See Comments)    liver damage; upset stomach   Dyazide [Hydrochlorothiazide W-Triamterene] Hives   Jardiance [Empagliflozin] Diarrhea    (Jardiance) stomach ache    Lisinopril     Cough    Metformin Hcl Er Other (See Comments)     upset stomach    Topamax [Topiramate] Other (See Comments)    Vision disturbances.   Citalopram Itching and Rash   Emtricitabine-Tenofovir Df Rash     Descovy   Losartan Nausea Only and Rash    Pt had rash, worsening dizziness, and nausea after starting losartan, which improved after stopping losartan   Triamterene-Hctz Rash   Medications Reviewed Today     Reviewed by Gayla Medicus, RN (Registered Nurse) on 12/28/21 at [place] List Status: <None>   Medication Order Taking? Sig Documenting Provider Last Dose Status Informant  amLODipine (NORVASC) 10 MG tablet [id number] No Take 1 tablet (10 mg total) by mouth daily. [name] Dame, MD Taking Active Self  atorvastatin (LIPITOR) 10 MG tablet [id number] No TAKE HALF TABLET BY MOUTH DAILY Katsadouros, Vasilios, MD Taking Active Self  bictegravir-emtricitabine-tenofovir AF (BIKTARVY) 50-200-25 MG TABS tablet [id number] No Take 1 tablet by mouth daily. [place] Callas, NP Taking Active Self  buPROPion (WELLBUTRIN XL) 300 MG  24 hr tablet [id number] No TAKE ONE TABLET BY MOUTH DAILY  Patient taking differently: Take 300 mg by mouth every evening.   [name] Dame, MD Taking Active  Self  cetirizine (ZYRTEC) 10 MG tablet [id number] No Take 2 tablets (20 mg total) by mouth 2 (two) times daily as needed for allergies. Kozlow, Donnamarie Poag, MD Taking Active   chlorhexidine (PERIDEX) 0.12 % solution [id number] No Use as directed 15 mLs in the mouth or throat 2 (two) times daily. [place] Callas, NP Taking Active Self  cholecalciferol (VITAMIN D) 25 MCG (1000 UNIT) tablet [id number] No Take 1 tablet (1,000 Units total) by mouth daily. [name] Dame, MD Taking Active   cyclobenzaprine (FLEXERIL) 10 MG tablet [id number] No Take 1 tablet (10 mg total) by mouth 3 (three) times daily as needed for muscle spasms. Magnant, Gerrianne Scale, PA-C Taking Active Self  diphenoxylate-atropine (LOMOTIL) 2.5-0.025 MG tablet [id number] No Take 1 tablet by mouth 4 (four) times daily as needed for diarrhea or loose stools. [place] Callas, NP Taking Active Self  famotidine (PEPCID) 40 MG tablet [id number] No Take 1 tablet (40 mg total) by mouth at bedtime. Kozlow, Donnamarie Poag, MD Taking Active   gabapentin (NEURONTIN) 300 MG capsule [id number] No Take 1 capsule (300 mg total) by mouth 2 (two) times daily. Meredith Pel, MD Taking Active   hydrocortisone (CORTEF) 5 MG tablet [id number] No Take 2.5-5 mg by mouth See admin instructions. Take 1 tablet (5 mg) by mouth in the morning (scheduled) & may taken an additional 0.5 tablet (2.5 mg) by mouth in the evening if needed. [provider] Taking Active Self  ibuprofen (ADVIL) 800 MG tablet [id number] No Take 1 tablet (800 mg total) by mouth every 8 (eight) hours as needed. Magnant, Gerrianne Scale, PA-C Taking Active   liraglutide (VICTOZA) 18 MG/3ML SOPN [id number] No Inject 1.2 mg into the skin every evening. [provider] Taking Active Self  methocarbamol (ROBAXIN) 500 MG tablet [id number] No Take 1 tablet (500 mg total) by mouth every 8 (eight) hours as needed for muscle spasms. Meredith Pel, MD Taking Active   metoprolol tartrate (LOPRESSOR) 25  MG tablet [id number] No Take 1 tablet (25 mg total) by mouth 2 (two) times daily. [name] Dame, MD Taking Active Self  montelukast (SINGULAIR) 10 MG tablet [id number] No Take 1 tablet (10 mg total) by mouth at bedtime. Kozlow, Donnamarie Poag, MD Taking Active   NEURONTIN 100 MG capsule [id number] No Take 2 capsules (200 mg total) by mouth 3 (three) times daily. Kozlow, Donnamarie Poag, MD Taking Active   nitroGLYCERIN (NITROSTAT) 0.4 MG SL tablet [id number] No Place 1 tablet (0.4 mg total) under the tongue every 5 (five) minutes as needed for chest pain. Isaiah Serge, NP Taking Active Self  nortriptyline (PAMELOR) 10 MG capsule [id number] No Take 2 capsules (20 mg total) by mouth at bedtime. Marcial Pacas, MD Taking Active Self  pantoprazole (PROTONIX) 40 MG tablet [id number] No Take 1 tablet (40 mg total) by mouth 2 (two) times daily. Kozlow, Donnamarie Poag, MD Taking Active   potassium chloride SA (KLOR-CON) 20 MEQ tablet [id number] No Take 20 mEq by mouth in the morning. [provider] Taking Active Self  RESTASIS 0.05 % ophthalmic emulsion [id number] No Place 1 drop into both eyes 2 (two) times daily. [provider] Taking Active Self  sodium chloride (OCEAN) 0.65 % SOLN nasal spray [id number] No PLACE 1 SPRAY INTO BOTH NOSTRILS AS NEEDED FOR CONGESTION. Kalman Shan [place],  DO Taking Active Self  SUMAtriptan (IMITREX) 25 MG tablet [id number] No Take 1 tablet (25 mg total) by mouth every 2 (two) hours as needed for migraine. May repeat in 2 hours if headache persists or recurs. Marcial Pacas, MD Taking Active Self  SYMBICORT 160-4.5 MCG/ACT inhaler [id number] No Inhale 2 puffs into the lungs in the morning and at bedtime. Kozlow, Donnamarie Poag, MD Taking Active   traMADol (ULTRAM) 50 MG tablet [id number] No Take 2 tablets (100 mg total) by mouth every 6 (six) hours as needed. Magnant, Gerrianne Scale, PA-C Taking Active   triamcinolone (NASACORT) 55 MCG/ACT AERO nasal inhaler [id number] No Place 1 spray into the nose 2  (two) times daily. 1 spray each nostril 2 times per day Jiles Prows, MD Taking Active   VENTOLIN HFA 108 (90 Base) MCG/ACT inhaler [id number] No INHALE TWO PUFFS BY MOUTH INTO THE LUNGS EVERY 4 HOURS AS NEEDED FOR WHEEZING OR SHORTNESS OF BREATH Kozlow, Donnamarie Poag, MD Taking Active            Patient Active Problem List   Diagnosis Date Noted   Synovitis of left shoulder    Complete tear of left rotator cuff    Impingement syndrome of left shoulder 07/01/2021   Left leg swelling 07/09/2020   Grief reaction 06/08/2020   Arm pain 02/10/2020   Dizziness on standing 12/27/2019   Generalized anxiety disorder with panic attacks 06/20/2019   Osteoarthritis of thumb, right 11/29/2018   Chronic migraine w/o aura w/o status migrainosus, not intractable 08/27/2018   Disorder of left eustachian tube 05/19/2017   Vertigo of central origin 01/23/2017   Eustachian tube dysfunction, bilateral 06/15/2016   Ear pain, right 06/15/2016   Spondylosis of lumbar region without myelopathy or radiculopathy 10/08/2015   BPPV (benign paroxysmal positional vertigo) 10/02/2015   Liver fibrosis ([place]) 12/04/2014   Pituitary microadenoma ([place]) 08/08/2014   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   Vitamin D deficiency 06/29/2014   Secondary adrenal insufficiency ([place]) 06/29/2014   History of tympanostomy tube placement 02/07/2014   Refusal of blood transfusions as patient is Jehovah's Witness 11/18/2013   Hepatitis C virus infection cured after antiviral drug therapy 03/16/2012   Health care maintenance 12/15/2011   Opioid dependence ([place]) 10/05/2010   HTN (hypertension) 10/05/2010   Hyperlipidemia associated with type 2 diabetes mellitus ([place]) 10/23/2008   HIV disease ([place]) 05/09/2006   Depression 05/09/2006   Allergic rhinitis 05/09/2006   GERD 05/09/2006   Conditions to be addressed/monitored per PCP order:  Chronic healthcare management needs, DM, HTN, asthma, GERD, anxiety, osteoarthritis,  HIV  Care Plan : General Plan of Care (Adult)  Updates made by Gayla Medicus, RN since 12/28/2021 12:00 AM     Problem: Health Promotion or Disease Self-Management (General Plan of Care)   Priority: Medium  Onset Date: 08/10/2020     Long-Range Goal: Self-Management Plan Developed   Start Date: 05/07/2020  Expected End Date: 02/18/2022  Recent Progress: On track  Priority: Medium  Note:   Current Barriers:  Chronic Disease Management support and education needs.  12/28/21:  Patient continues with PT for 8 more weeks, slow, steady progress per patient.  Awaiting hearing aid for left ear.  Blood sugars and BP WNL.    Nurse Case Manager Clinical Goal(s):  Over the next 30 days, patient will attend all scheduled medical appointments  Interventions:  Inter-disciplinary care team collaboration (see longitudinal plan of care) Evaluation of current treatment plan and patient's adherence to plan  as established by provider. Reviewed medications with patient. Collaborated with pharmacy regarding medications. Discussed plans with patient for ongoing care management follow up and provided patient with direct contact information for care management team Reviewed scheduled/upcoming provider appointments. Pharmacy referral for medication review-completed. Patient provided with [facility] transportation information.  Patient Goals/Self-Care Activities Over the next 30 days, patient will:  -Attends all scheduled provider appointments Calls pharmacy for medication refills Calls provider office for new concerns or questions  Follow Up Plan: The Managed Medicaid care management team will reach out to the patient again over the next 30 business  days.  The patient has been provided with contact information for the Managed Medicaid care management team and has been advised to call with any health related questions or concerns.    Follow Up:  Patient agrees to Care Plan and Follow-up.  Plan: The Managed  Medicaid care management team will reach out to the patient again over the next 30 business  days. and The  Patient has been provided with contact information for the Managed Medicaid care management team and has been advised to call with any health related questions or concerns.  Date/time of next scheduled RN care management/care coordination outreach: 01/31/22 at 1030.

## 2021-12-30 ENCOUNTER — Ambulatory Visit: Payer: Medicaid Other

## 2021-12-31 ENCOUNTER — Ambulatory Visit (INDEPENDENT_AMBULATORY_CARE_PROVIDER_SITE_OTHER): Payer: Medicaid Other

## 2021-12-31 ENCOUNTER — Ambulatory Visit (INDEPENDENT_AMBULATORY_CARE_PROVIDER_SITE_OTHER): Payer: Medicaid Other | Admitting: Podiatry

## 2021-12-31 DIAGNOSIS — I739 Peripheral vascular disease, unspecified: Secondary | ICD-10-CM | POA: Diagnosis not present

## 2021-12-31 DIAGNOSIS — M79672 Pain in left foot: Secondary | ICD-10-CM

## 2021-12-31 DIAGNOSIS — M7989 Other specified soft tissue disorders: Secondary | ICD-10-CM | POA: Diagnosis not present

## 2021-12-31 DIAGNOSIS — M779 Enthesopathy, unspecified: Secondary | ICD-10-CM

## 2021-12-31 DIAGNOSIS — G5762 Lesion of plantar nerve, left lower limb: Secondary | ICD-10-CM

## 2021-12-31 MED ORDER — TRIAMCINOLONE ACETONIDE 10 MG/ML IJ SUSP
10.0000 mg | Freq: Once | INTRAMUSCULAR | Status: AC
  Administered 2021-12-31: 10 mg

## 2021-12-31 MED ORDER — CICLOPIROX 8 % EX SOLN: Freq: Every day | CUTANEOUS | 2 refills | Status: DC

## 2021-12-31 NOTE — Patient Instructions (Signed)

## 2021-12-31 NOTE — Progress Notes (Unsigned)
Subjective:   [name], female   DOB: 61 y.o.   MRN: [id number]   HPI Chief Complaint  Patient presents with   Foot Pain    Patient came in today with left ball of the foot pain, rate of pain 9 out of 10, burning, TX: icing, pain meds , X-Rays were taken today  A1c- 6.1  BG-124 (Yesterday)      She states that the pain has been ongoing for ahile. She was diagnosed with addison and she has gained wait and since then she gets pain to the ball of the foot. She points to sub 2 area. She staes it burns.   She has tried [employer] on the toenails but the fungus has not improved.    ROS      Objective:  Physical Exam  ***     Assessment:  ***     Plan:  ***   Left inj, volt, met pad [place]

## 2022-01-04 ENCOUNTER — Other Ambulatory Visit: Payer: Self-pay | Admitting: Podiatry

## 2022-01-04 DIAGNOSIS — G5762 Lesion of plantar nerve, left lower limb: Secondary | ICD-10-CM

## 2022-01-06 ENCOUNTER — Ambulatory Visit: Payer: Medicaid Other | Attending: Orthopedic Surgery

## 2022-01-06 DIAGNOSIS — M6281 Muscle weakness (generalized): Secondary | ICD-10-CM | POA: Diagnosis present

## 2022-01-06 DIAGNOSIS — M25512 Pain in left shoulder: Secondary | ICD-10-CM | POA: Insufficient documentation

## 2022-01-06 DIAGNOSIS — G8929 Other chronic pain: Secondary | ICD-10-CM | POA: Diagnosis present

## 2022-01-06 DIAGNOSIS — M25612 Stiffness of left shoulder, not elsewhere classified: Secondary | ICD-10-CM | POA: Diagnosis present

## 2022-01-06 NOTE — Therapy (Signed)
OUTPATIENT PHYSICAL THERAPY TREATMENT NOTE   Patient Name: [name] [name] MRN: [id number] DOB:[birth year], 61 y.o., female Today's Date: 01/06/2022  PCP: [name], MD  REFERRING PROVIDER: Meredith Pel, MD   END OF SESSION:   PT End of Session - 01/06/22 1056     Visit Number 19    Number of Visits 21    Date for PT Re-Evaluation 01/21/22    Authorization Type [place] MEDICAID [place] - Number of Visits 27    Progress Note Due on Visit 20    PT Start Time 1030    PT Stop Time 1110    PT Time Calculation (min) 40 min    Activity Tolerance Patient limited by pain;Patient tolerated treatment well    Behavior During Therapy [facility] for tasks assessed/performed                 Past Medical History:  Diagnosis Date   Allergic rhinitis 05/09/2006   Allergy    Anxiety    Arthritis    Asthma    CHF (congestive heart failure) (HCC)    Chronic back pain    Diabetes mellitus without complication (HCC)    GERD (gastroesophageal reflux disease)    Heart murmur    as a child   Hepatitis C    genotype 1b, 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 s/p 12 week course of simeprevir and sofosbuvir between October 2014 and January 2015 with resolution.   History of shingles    HIV infection ([place])    1994   Hyperlipidemia    no meds taken now   Hypertension    Migraine    Pituitary microadenoma ([place]) 08/08/2014   Pneumonia    Prediabetes    Refusal of blood transfusions as patient is Jehovah's Witness    Secondary adrenal insufficiency ([place]) 06/29/2014   Urticaria    Past Surgical History:  Procedure Laterality Date   BUNIONECTOMY     b/l   COLECTOMY     2003 for diverticulitis, had colostomy bag and then reversed   COLONOSCOPY     HAND SURGERY     HAND SURGERY Right    right thumb surgery Dr Burney Gauze 2021   NASAL SINUS SURGERY     SHOULDER ARTHROSCOPY WITH OPEN ROTATOR CUFF REPAIR AND DISTAL CLAVICLE ACROMINECTOMY  Left 08/31/2021   Procedure: LEFT SHOULDER ARTHROSCOPY, DEBRIDEMENT,  ROTATOR CUFF TEAR REPAIR;  Surgeon: Meredith Pel, MD;  Location: [place];  Service: Orthopedics;  Laterality: Left;   SHOULDER SURGERY     left   TONSILLECTOMY     Patient Active Problem List   Diagnosis Date Noted   Synovitis of left shoulder    Complete tear of left rotator cuff    Impingement syndrome of left shoulder 07/01/2021   Left leg swelling 07/09/2020   Grief reaction 06/08/2020   Arm pain 02/10/2020   Dizziness on standing 12/27/2019   Generalized anxiety disorder with panic attacks 06/20/2019   Osteoarthritis of thumb, right 11/29/2018   Chronic migraine w/o aura w/o status migrainosus, not intractable 08/27/2018   Disorder of left eustachian tube 05/19/2017   Vertigo of central origin 01/23/2017   Eustachian tube dysfunction, bilateral 06/15/2016   Ear pain, right 06/15/2016   Spondylosis of lumbar region without myelopathy or radiculopathy 10/08/2015   BPPV (benign paroxysmal positional vertigo) 10/02/2015   Liver fibrosis ([place]) 12/04/2014   Pituitary microadenoma ([place]) 08/08/2014   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  Vitamin D deficiency 06/29/2014   Secondary adrenal insufficiency ([place]) 06/29/2014   History of tympanostomy tube placement 02/07/2014   Refusal of blood transfusions as patient is Jehovah's Witness 11/18/2013   Hepatitis C virus infection cured after antiviral drug therapy 03/16/2012   Health care maintenance 12/15/2011   Opioid dependence ([place]) 10/05/2010   HTN (hypertension) 10/05/2010   Hyperlipidemia associated with type 2 diabetes mellitus ([place]) 10/23/2008   HIV disease ([place]) 05/09/2006   Depression 05/09/2006   Allergic rhinitis 05/09/2006   GERD 05/09/2006    REFERRING DIAG: M79.602 (ICD-10-CM) - Left arm pain.  Left shoulder arthroscopy with debridement and rotator cuff tear repair.  THERAPY DIAG:  Chronic left shoulder pain  Muscle weakness  (generalized)  Stiffness of left shoulder, not elsewhere classified  Rationale for Evaluation and Treatment Rehabilitation  Onset: 08/31/21   SUBJECTIVE: Pt reports overall her L shoulder is improving, but still has higher levels of pain.                                                                                                                                                                NPRS: 4/10, 8/10 with movement Pain location: L shoulder  Pain description: ache , throb, sharp Aggravating factors: L shoulder movement Relieving factors: Ice man, rest, medications Pain range 6-8/10   PRECAUTIONS: Other: per Dr. Randel Pigg visit note, pt may DC sling with start of PT. Pt is not to completing lifting of the L arm  From: Donella Stade, PA-C  Should be good for full active range of motion and early rotator cuff strengthening exercises starting on 10/13/2021 which will be about 6 weeks out from surgery.     PERTINENT HISTORY: left shoulder arthroscopy debridement rotator cuff tear repair 08/31/21, arthritis, anxiety, CHF, DM   WEIGHT BEARING RESTRICTIONS No   FALLS:  Has patient fallen in last 6 months? No   LIVING ENVIRONMENT: Lives with: lives with their family and lives alone Lives in: House/apartment Pt is able to access and be mobile with in her home   OCCUPATION: Disabled   PLOF: Independent   PATIENT GOALS Use it with less pain, better use with cooking and cleaning   OBJECTIVE: (objective measures completed at initial evaluation unless otherwise dated)   DIAGNOSTIC FINDINGS:  L shoulder MRI 05/25/21 IMPRESSION: 1. Chronic calcific tendinosis of the posterior supraspinatus with moderate intermediate T2 signal and thickening tendinosis of the mid to posterior supraspinatus and anterior infraspinatus. 2. Additional likely chronic calcific tendinosis of the superior subscapularis tendon along with a moderate partial-thickness tear. 3. Status post distal  clavicle excision and acromioplasty. 4. Mild subacromial/subdeltoid bursitis. 5. Mild-to-moderate glenohumeral cartilage degenerative changes   PATIENT SURVEYS:  Quick Dash 53/55. 10/20/21=43/55   COGNITION:  Overall cognitive status: Within functional limits for tasks assessed                                  SENSATION: WFL   POSTURE: Forward head c CT step off and dorsal hump, increased kyphosis, rounded shoulders,    UPPER EXTREMITY ROM:    Passive ROM Right 09/21/2021 Left 09/21/2021 LT 10/01/21 LT 10/11/21 LT AROM  10/25/21 LT AROM 11/02/21 LT 11/24/21 LT 12/02/21 LT 12/09/21  Shoulder flexion 160 105 d, limited by pain 125, limited by pain, empty endfeel 140, limited by pain c empty end feel 110 120 130 140d 140d  Shoulder extension   115, limited by pain         Shoulder abduction 165   115, limited by pain, empty end feel 130, limited by pain c empty end feel 90 90 90 90p!   Shoulder adduction             Shoulder internal rotation T7           Shoulder external rotation T6  45d Limited by pain 45d, not to exceed yet     T2 T2  Elbow flexion             Elbow extension             Wrist flexion             Wrist extension             Wrist ulnar deviation             Wrist radial deviation             Wrist pronation             Wrist supination             Empty end feel for PROM for L shoulder flexion, abd, and ER (Blank rows = not tested)  Passive ROM Right 10/2821 Rt Rt 01/06/22     Shoulder flexion AROM 130 A145 A155     Shoulder extension         Shoulder abduction AROM 90, limited by pin and weakness  A 90 A90 AA140     Shoulder adduction          Shoulder internal rotation PROM 45d, limited by pain        Shoulder external rotation PROM, limited by pain       Elbow flexion          Elbow extension          Wrist flexion          Wrist extension          Wrist ulnar deviation          Wrist radial deviation          Wrist pronation           Wrist supination              UPPER EXTREMITY MMT:                       L not tested due to protocol   PALPATION:  TTP of the upper trap and supraspinatus region   OBSERVATION: Steri-strips covering the incision             TODAY'S TREATMENT:  [facility] Adult PT Treatment:  DATE: 01/06/22 Therapeutic Exercise: Doorway pectoral stretch x3 30" Supine shoulder flexion 3x8 SL ER 2# 3x8 SL abd to 90d 3x8 Shoulder abd c wall slide x10 Bilat Shoulder row 2x15 GTB Bilat Shoulder ext 2x15 GTB Modalities: Vaso x 15 min c minimal compression, 36dF, left shoulder  [facility] Adult PT Treatment:                                                DATE: 12/22/21 Therapeutic Exercise: Shoulder pulleys flexion and scaption 2 min each Doorway pectoral stretch x3 30" SL sleeper stretch x3 30" Supine shoulder flexion 3x SL ER 1# 3x8 SL abd to 90d 3x8 Standing shoulder abd c wall A x10 Bilat Shoulder row 2x15 GTB Bilat Shoulder ext 2x15 GTB Updated HEP Modalities: Vaso x 15 min c minimal compression, 36dF, left shoulder  [facility] Adult PT Treatment:                                                DATE: 12/16/21 Therapeutic Exercise: Shoulder pulleys flexion and scaption 2 min each Doorway pectoral stretch x3 30" SL sleeper stretch x3 30" SL ER 1# 2x10 SL abd to 90d x10 Bilat Shoulder row 2x15 GTB Bilat Shoulder ext 2x15 GTB Manual Therapy: STM to the ant and post [place] muscles Instruction and pt completing tennis ball/wall massage to the ant and post GH muscles for home  Modalities: Vaso x 15 min c minimal compression, 36dF, left shoulder  PATIENT EDUCATION: Education details: Eval findings, POC, HEP Person educated: Patient Education method: Explanation, Demonstration, Tactile cues, Verbal cues, and Handouts Education comprehension: verbalized understanding, returned demonstration, verbal cues required, and tactile cues required     HOME EXERCISE  PROGRAM: Access Code: [email or web address] URL: [email or web address] Date: 12/22/2021 Prepared by: Gar Ponto  Exercises - Seated Shoulder Flexion Slide at Table Top with Forearm in Neutral  - 2 x daily - 7 x weekly - 1 sets - 5 reps - 5 hold - Seated Shoulder Scaption Slide at Table Top with Forearm in Neutral  - 2 x daily - 7 x weekly - 1 sets - 5 reps - 5 hold - Seated Shoulder External Rotation PROM on Table  - 2 x daily - 7 x weekly - 1 sets - 5 reps - 5 hold - Sleeper Stretch  - 1 x daily - 7 x weekly - 1 sets - 3 reps - 30 hold - Doorway Pec Stretch at 60 Elevation  - 1 x daily - 7 x weekly - 1 sets - 3 reps - 30 hold - Standing Shoulder Row with Anchored Resistance  - 1 x daily - 7 x weekly - 2 sets - 10 reps - 2 hold - Shoulder Extension with Resistance  - 1 x daily - 7 x weekly - 2 sets - 10 reps - 2 hold - Sidelying Shoulder External Rotation Dumbbell (Mirrored)  - 1 x daily - 7 x weekly - 2 sets - 10 reps - Sidelying Shoulder Abduction Palm Forward (Mirrored)  - 1 x daily - 7 x weekly - 2 sets - 10 reps - 2 hold    ASSESSMENT:   CLINICAL IMPRESSION: PT focused on L RC and periscapular strengthening f/b vaso at the  end of the session for symptom management. Active flexion continues to improve, while active abd is limited due to weakness. With wall assist pt is able to abduct. Pt is improving, but at a slow pace. Pt has 1 more PT appt and anticipate DC at that time to a HEP to continue her rehab at home.   OBJECTIVE IMPAIRMENTS decreased ROM, decreased strength, impaired UE functional use, postural dysfunction, and pain.    ACTIVITY LIMITATIONS carrying, lifting, sleeping, dressing, and reach over head   PARTICIPATION LIMITATIONS: meal prep, cleaning, laundry, driving, shopping, community activity, and yard work   PERSONAL FACTORS Past/current experiences, Time since onset of injury/illness/exacerbation, and 1 comorbidity: DM  are also affecting patient's functional  outcome.    REHAB POTENTIAL: Good   CLINICAL DECISION MAKING: Stable/uncomplicated   EVALUATION COMPLEXITY: Low     GOALS:   SHORT TERM GOALS: Target date: 10/12/2021   Pt will be Ind in an initial HEP Baseline:started on eval Goal status: MET   2.  Increase L shoulder flexion PROM to 125d and abd to 135d to progress to proper R shoulder function Baseline: flex= 105d and abd= 115d Goal status: MET   LONG TERM GOALS: Target date: 01/21/22   Increase L shoulder strength to 4 or > for improved R shoulder function Baseline: NT Status: 11/01/21: grossly 3/5  Goal status: Improving   2.  Increase L shoulder AROM to at least 85% of the R shoulder to complete above shoulder reaching  Baseline: See flow sheet Status; 11/18/21: Flexion 130d, abd 90d Goal status: Improving   3. Decrease L shoulder pain to 3/10 or less for improved L UE function and QOL  Baseline: 4-10/10 Status 8/10 Goal status: No improvement   4.  Pt will complete L shoulder above shoulder reaching c min to zero scapular shrug Baseline: unable Goal status: MET   5.  Pt will score 27 or less with the UE Quickdash as indication of improved function Baseline: 53 Status:11/01/21: 43/55 Goal status: Improving    6.  Pt will be Ind in a final HEP to maintain achieved LOF and QOL Baselinestarted on eval  Goal status: INITIAL     PLAN: PT FREQUENCY: 1w8   PT DURATION: 8 weeks   PLANNED INTERVENTIONS: Therapeutic exercises, Therapeutic activity, Neuromuscular re-education, Balance training, Gait training, Patient/Family education, Joint mobilization, Aquatic Therapy, Dry Needling, Electrical stimulation, Cryotherapy, Moist heat, Taping, Vasopneumatic device, Ultrasound, Ionotophoresis 69m/ml Dexamethasone, Manual therapy, and Re-evaluation   PLAN FOR NEXT SESSION: [place], PT 01/06/22 1:30 PM

## 2022-01-12 ENCOUNTER — Ambulatory Visit: Payer: Medicaid Other

## 2022-01-12 DIAGNOSIS — M25512 Pain in left shoulder: Secondary | ICD-10-CM | POA: Diagnosis not present

## 2022-01-12 DIAGNOSIS — M25612 Stiffness of left shoulder, not elsewhere classified: Secondary | ICD-10-CM

## 2022-01-12 DIAGNOSIS — G8929 Other chronic pain: Secondary | ICD-10-CM

## 2022-01-12 DIAGNOSIS — M6281 Muscle weakness (generalized): Secondary | ICD-10-CM

## 2022-01-12 NOTE — Therapy (Signed)
OUTPATIENT PHYSICAL THERAPY TREATMENT NOTE/Progess note/Discharge   Patient Name: [name] [name] MRN: [id number] DOB:1960-09-02, 61 y.o., female Today's Date: 01/12/2022  PCP: [name], MD  REFERRING PROVIDER: Meredith Pel, M  Progress Note Reporting Period 11/12/21 to 01/12/22  See note below for Objective Data and Assessment of Progress/Goals.       END OF SESSION:   PT End of Session - 01/12/22 1619     Visit Number 20    Date for PT Re-Evaluation 01/21/22    Authorization Type [place] MEDICA[employer]    Authorization - Number of Visits 27    Progress Note Due on Visit 20    PT Start Time 1015    PT Stop Time 1105    PT Time Calculation (min) 50 min    Activity Tolerance Patient limited by pain;Patient tolerated treatment well    Behavior During Therapy [facility] for tasks assessed/performed                  Past Medical History:  Diagnosis Date   Allergic rhinitis 05/09/2006   Allergy    Anxiety    Arthritis    Asthma    CHF (congestive heart failure) (HCC)    Chronic back pain    Diabetes mellitus without complication (HCC)    GERD (gastroesophageal reflux disease)    Heart murmur    as a child   Hepatitis C    genotype 1b, 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 s/p 12 week course of simeprevir and sofosbuvir between October 2014 and January 2015 with resolution.   History of shingles    HIV infection ([place])    1994   Hyperlipidemia    no meds taken now   Hypertension    Migraine    Pituitary microadenoma ([place]) 08/08/2014   Pneumonia    Prediabetes    Refusal of blood transfusions as patient is Jehovah's Witness    Secondary adrenal insufficiency ([place]) 06/29/2014   Urticaria    Past Surgical History:  Procedure Laterality Date   BUNIONECTOMY     b/l   COLECTOMY     2003 for diverticulitis, had colostomy bag and then reversed   COLONOSCOPY     HAND SURGERY     HAND SURGERY Right    right thumb surgery  Dr Burney Gauze 2021   NASAL SINUS SURGERY     SHOULDER ARTHROSCOPY WITH OPEN ROTATOR CUFF REPAIR AND DISTAL CLAVICLE ACROMINECTOMY Left 08/31/2021   Procedure: LEFT SHOULDER ARTHROSCOPY, DEBRIDEMENT,  ROTATOR CUFF TEAR REPAIR;  Surgeon: Meredith Pel, MD;  Location: [place];  Service: Orthopedics;  Laterality: Left;   SHOULDER SURGERY     left   TONSILLECTOMY     Patient Active Problem List   Diagnosis Date Noted   Synovitis of left shoulder    Complete tear of left rotator cuff    Impingement syndrome of left shoulder 07/01/2021   Left leg swelling 07/09/2020   Grief reaction 06/08/2020   Arm pain 02/10/2020   Dizziness on standing 12/27/2019   Generalized anxiety disorder with panic attacks 06/20/2019   Osteoarthritis of thumb, right 11/29/2018   Chronic migraine w/o aura w/o status migrainosus, not intractable 08/27/2018   Disorder of left eustachian tube 05/19/2017   Vertigo of central origin 01/23/2017   Eustachian tube dysfunction, bilateral 06/15/2016   Ear pain, right 06/15/2016   Spondylosis of lumbar region without myelopathy or radiculopathy 10/08/2015   BPPV (benign paroxysmal positional vertigo) 10/02/2015  Liver fibrosis ([place]) 12/04/2014   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/08/2014   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   Vitamin D deficiency 06/29/2014   Secondary adrenal insufficiency ([place]) 06/29/2014   History of tympanostomy tube placement 02/07/2014   Refusal of blood transfusions as patient is Jehovah's Witness 11/18/2013   Hepatitis C virus infection cured after antiviral drug therapy 03/16/2012   Health care maintenance 12/15/2011   Opioid dependence ([place]) 10/05/2010   HTN (hypertension) 10/05/2010   Hyperlipidemia associated with type 2 diabetes mellitus ([place]) 10/23/2008   HIV disease ([place]) 05/09/2006   Depression 05/09/2006   Allergic rhinitis 05/09/2006   GERD 05/09/2006    REFERRING DIAG: M79.602 (ICD-10-CM) - Left arm pain.  Left  shoulder arthroscopy with debridement and rotator cuff tear repair.  THERAPY DIAG:  Chronic left shoulder pain  Muscle weakness (generalized)  Stiffness of left shoulder, not elsewhere classified  Rationale for Evaluation and Treatment Rehabilitation  Onset: 08/31/21   SUBJECTIVE: Pt reports her L shoulder pain is better, but continues to have high level of pain. She notes use of the L arm continues to improve.                                                                                                                                                        NPRS: 4/10, 8/10 with movement Pain location: L shoulder  Pain description: ache , throb, sharp Aggravating factors: L shoulder movement Relieving factors: Ice man, rest, medications Pain range 4-8/10   PRECAUTIONS: Other: per Dr. Randel Pigg visit note, pt may DC sling with start of PT. Pt is not to completing lifting of the L arm  From: Donella Stade, PA-C  Should be good for full active range of motion and early rotator cuff strengthening exercises starting on 10/13/2021 which will be about 6 weeks out from surgery.     PERTINENT HISTORY: left shoulder arthroscopy debridement rotator cuff tear repair 08/31/21, arthritis, anxiety, CHF, DM   WEIGHT BEARING RESTRICTIONS No   FALLS:  Has patient fallen in last 6 months? No   LIVING ENVIRONMENT: Lives with: lives with their family and lives alone Lives in: House/apartment Pt is able to access and be mobile with in her home   OCCUPATION: Disabled   PLOF: Independent   PATIENT GOALS Use it with less pain, better use with cooking and cleaning   OBJECTIVE: (objective measures completed at initial evaluation unless otherwise dated)   DIAGNOSTIC FINDINGS:  L shoulder MRI 05/25/21 IMPRESSION: 1. Chronic calcific tendinosis of the posterior supraspinatus with moderate intermediate T2 signal and thickening tendinosis of the mid to posterior supraspinatus and anterior  infraspinatus. 2. Additional likely chronic calcific tendinosis of the superior subscapularis tendon along with a moderate partial-thickness tear. 3. Status post distal clavicle excision and acromioplasty. 4. Mild subacromial/subdeltoid bursitis. 5. Mild-to-moderate  glenohumeral cartilage degenerative changes   PATIENT SURVEYS:  Quick Dash 53/55. 10/20/21=43/55 01/12/22= 26/55   COGNITION:           Overall cognitive status: Within functional limits for tasks assessed                                  SENSATION: WFL   POSTURE: Forward head c CT step off and dorsal hump, increased kyphosis, rounded shoulders,    UPPER EXTREMITY ROM:    Passive ROM Right 09/21/2021 Left 09/21/2021 LT 10/01/21 LT 10/11/21 LT AROM  10/25/21 LT AROM 11/02/21 LT 11/24/21 LT 12/02/21 LT 12/09/21  Shoulder flexion 160 105 d, limited by pain 125, limited by pain, empty endfeel 140, limited by pain c empty end feel 110 120 130 140d 140d  Shoulder extension   115, limited by pain         Shoulder abduction 165   115, limited by pain, empty end feel 130, limited by pain c empty end feel 90 90 90 90p!   Shoulder adduction             Shoulder internal rotation T7           Shoulder external rotation T6  45d Limited by pain 45d, not to exceed yet     T2 T2  Elbow flexion             Elbow extension             Wrist flexion             Wrist extension             Wrist ulnar deviation             Wrist radial deviation             Wrist pronation             Wrist supination             Empty end feel for PROM for L shoulder flexion, abd, and ER (Blank rows = not tested)  Passive ROM Lt 10/2821 Lt Lt 01/06/22 Lt    Shoulder flexion AROM 130 A145 A155 A160    Shoulder extension         Shoulder abduction AROM 90, limited by pin and weakness  A 90 A90 AA140 A90 elbow ext A 140 elbow bent     Shoulder adduction          Shoulder internal rotation PROM 45d, limited by pain    gluteal    Shoulder external  rotation PROM, limited by pain   T3    Elbow flexion          Elbow extension          Wrist flexion          Wrist extension          Wrist ulnar deviation          Wrist radial deviation          Wrist pronation          Wrist supination              UPPER EXTREMITY MMT:                       L not tested due to protocol   PALPATION:  TTP of the  upper trap and supraspinatus region   OBSERVATION: Steri-strips covering the incision             TODAY'S TREATMENT:  [facility] Adult PT Treatment:                                                DATE: 01/12/22 Therapeutic Exercise: Doorway pectoral stretch x3 30" Standing shoulder flexion to 90d 3x8 SL ER 2# 3x8 SL abd to 90d 3x8 1# Sleeper stretch  Shoulder abd c wall slide x10 Bilat Shoulder row 2x15 GTB Bilat Shoulder ext 2x15 GTB Updated HEP  [facility] Adult PT Treatment:                                                DATE: 01/06/22 Therapeutic Exercise: Doorway pectoral stretch x3 30" Supine shoulder flexion 3x8 SL ER 2# 3x8 SL abd to 90d 3x8 Shoulder abd c wall slide x10 Bilat Shoulder row 2x15 GTB Bilat Shoulder ext 2x15 GTB Modalities: Vaso x 15 min c minimal compression, 36dF, left shoulder  [facility] Adult PT Treatment:                                                DATE: 12/22/21 Therapeutic Exercise: Shoulder pulleys flexion and scaption 2 min each Doorway pectoral stretch x3 30" SL sleeper stretch x3 30" Supine shoulder flexion 3x SL ER 1# 3x8 SL abd to 90d 3x8 Standing shoulder abd c wall A x10 Bilat Shoulder row 2x15 GTB Bilat Shoulder ext 2x15 GTB Updated HEP Modalities: Vaso x 15 min c minimal compression, 36dF, left shoulder  [facility] Adult PT Treatment:                                                DATE: 12/16/21 Therapeutic Exercise: Shoulder pulleys flexion and scaption 2 min each Doorway pectoral stretch x3 30" SL sleeper stretch x3 30" SL ER 1# 2x10 SL abd to 90d x10 Bilat Shoulder row 2x15 GTB Bilat  Shoulder ext 2x15 GTB Manual Therapy: STM to the ant and post [place] muscles Instruction and pt completing tennis ball/wall massage to the ant and post GH muscles for home  Modalities: Vaso x 15 min c minimal compression, 36dF, left shoulder  PATIENT EDUCATION: Education details: Eval findings, POC, HEP Person educated: Patient Education method: Explanation, Demonstration, Tactile cues, Verbal cues, and Handouts Education comprehension: verbalized understanding, returned demonstration, verbal cues required, and tactile cues required     HOME EXERCISE PROGRAM: Access Code: [email or web address] URL: [email or web address] Date: 01/12/2022 Prepared by: Gar Ponto  Exercises - Seated Shoulder Flexion Slide at Table Top with Forearm in Neutral  - 2 x daily - 7 x weekly - 1 sets - 5 reps - 5 hold - Seated Shoulder Scaption Slide at Table Top with Forearm in Neutral  - 2 x daily - 7 x weekly - 1 sets - 5 reps - 5 hold - Seated Shoulder External Rotation PROM on Table  - 2  x daily - 7 x weekly - 1 sets - 5 reps - 5 hold - Sleeper Stretch  - 1 x daily - 7 x weekly - 1 sets - 3 reps - 30 hold - Doorway Pec Stretch at 60 Elevation  - 1 x daily - 7 x weekly - 1 sets - 3 reps - 30 hold - Standing Shoulder Row with Anchored Resistance  - 1 x daily - 7 x weekly - 2 sets - 10 reps - 2 hold - Shoulder Extension with Resistance  - 1 x daily - 7 x weekly - 2 sets - 10 reps - 2 hold - Sidelying Shoulder External Rotation Dumbbell (Mirrored)  - 1 x daily - 7 x weekly - 2 sets - 10 reps - Sidelying Shoulder Abduction Palm Forward (Mirrored)  - 1 x daily - 7 x weekly - 2 sets - 10 reps - 2 hold - Supine Scapular Protraction in Flexion with Dumbbells  - 1 x daily - 7 x weekly - 2 sets - 10 reps - 2 hold - Standing Shoulder Flexion to 90 Degrees with Dumbbells  - 1 x daily - 7 x weekly - 2 sets - 10 reps - 2 hold - Standing Shoulder Abduction Finger Walk at Wall  - 1 x daily - 7 x weekly - 2 sets - 10 reps - 2  hold    ASSESSMENT:   CLINICAL IMPRESSION: Pt completed her last scheduled PT session today. PT was continued for L RC and periscapular strengthening. Overall, pt's progress with L shoulder strengthening has been slow, but it has continued to make gradually progress with improved functional ROM and use of the L UE. As with strength, pt's L shoulder pain is still an issue, but is slowly improving. Pt has participated in 20 PT visits. At this time, PT is recommending pt continue with her strengthening HEP and if she is not progressing over the next 2 months, then return to [place].   OBJECTIVE IMPAIRMENTS decreased ROM, decreased strength, impaired UE functional use, postural dysfunction, and pain.    ACTIVITY LIMITATIONS carrying, lifting, sleeping, dressing, and reach over head   PARTICIPATION LIMITATIONS: meal prep, cleaning, laundry, driving, shopping, community activity, and yard work   PERSONAL FACTORS Past/current experiences, Time since onset of injury/illness/exacerbation, and 1 comorbidity: DM  are also affecting patient's functional outcome.    REHAB POTENTIAL: Good   CLINICAL DECISION MAKING: Stable/uncomplicated   EVALUATION COMPLEXITY: Low     GOALS:   SHORT TERM GOALS: Target date: 10/12/2021   Pt will be Ind in an initial HEP Baseline:started on eval Goal status: MET   2.  Increase L shoulder flexion PROM to 125d and abd to 135d to progress to proper R shoulder function Baseline: flex= 105d and abd= 115d Goal status: MET   LONG TERM GOALS: Target date: 01/21/22   Increase L shoulder strength to 4 or > for improved R shoulder function Baseline: NT Status: 11/01/21: grossly 3/5 Status: 3+/5 Goal status: Improved- Not met   2.  Increase L shoulder AROM to at least 85% of the R shoulder to complete above shoulder reaching  Baseline: See flow sheet Status; 11/18/21: Flexion 130d, abd 90d Status: flexion 160d, abd c flexed elbow 140d Goal status: MET   3. Decrease L  shoulder pain to 3/10 or less for improved L UE function and QOL  Baseline: 4-10/10 Status 4-8/10  Goal status: Improved- not met   4.  Pt will complete L shoulder above  shoulder reaching c min to zero scapular shrug Baseline: unable Goal status: MET   5.  Pt will score 27 or less with the UE Quickdash as indication of improved function Baseline:  Status:11/01/21: 43/55 01/12/22= 26 Goal status: MET    6.  Pt will be Ind in a final HEP to maintain achieved LOF and QOL Baselinestarted on eval  Goal status: MET     PLAN: PT FREQUENCY: 1w8   PT DURATION: 8 weeks   PLANNED INTERVENTIONS: Therapeutic exercises, Therapeutic activity, Neuromuscular re-education, Balance training, Gait training, Patient/Family education, Joint mobilization, Aquatic Therapy, Dry Needling, Electrical stimulation, Cryotherapy, Moist heat, Taping, Vasopneumatic device, Ultrasound, Ionotophoresis 26m/ml Dexamethasone, Manual therapy, and Re-evaluation   PLAN FOR NEXT SESSION:   PHYSICAL THERAPY DISCHARGE SUMMARY  Visits from Start of Care: 20  Current functional level related to goals / functional outcomes: See clinical impression and goals   Remaining deficits: See clinical impression and goals   Education / Equipment: HEP   Patient agrees to discharge. Patient goals were partially met. Patient is being discharged due to being pleased with the current functional level.    Josearmando Kuhnert MS, PT 01/12/22 5:56 PM

## 2022-01-13 ENCOUNTER — Ambulatory Visit (HOSPITAL_COMMUNITY)
Admission: RE | Admit: 2022-01-13 | Discharge: 2022-01-13 | Disposition: A | Discharge status: Home / Self Care | Payer: Medicaid Other | Source: Ambulatory Visit | Attending: Cardiology | Admitting: Cardiology

## 2022-01-13 DIAGNOSIS — I739 Peripheral vascular disease, unspecified: Secondary | ICD-10-CM | POA: Insufficient documentation

## 2022-01-17 ENCOUNTER — Other Ambulatory Visit: Payer: Self-pay | Admitting: Infectious Diseases

## 2022-01-17 ENCOUNTER — Other Ambulatory Visit: Payer: Self-pay | Admitting: Allergy and Immunology

## 2022-01-17 ENCOUNTER — Other Ambulatory Visit: Payer: Self-pay | Admitting: Student

## 2022-01-17 ENCOUNTER — Other Ambulatory Visit: Payer: Self-pay | Admitting: Surgical

## 2022-01-17 DIAGNOSIS — L299 Pruritus, unspecified: Secondary | ICD-10-CM

## 2022-01-17 DIAGNOSIS — I10 Essential (primary) hypertension: Secondary | ICD-10-CM

## 2022-01-17 DIAGNOSIS — E2749 Other adrenocortical insufficiency: Secondary | ICD-10-CM | POA: Diagnosis not present

## 2022-01-17 DIAGNOSIS — R682 Dry mouth, unspecified: Secondary | ICD-10-CM

## 2022-01-17 DIAGNOSIS — D352 Benign neoplasm of pituitary gland: Secondary | ICD-10-CM | POA: Diagnosis not present

## 2022-01-17 DIAGNOSIS — E119 Type 2 diabetes mellitus without complications: Secondary | ICD-10-CM | POA: Diagnosis not present

## 2022-01-17 NOTE — Telephone Encounter (Signed)
Please advise on refill. Not able to reach patient regarding refill request.

## 2022-01-17 NOTE — Telephone Encounter (Signed)
Okay to refill per [name], NP.   Beryle Flock, RN

## 2022-01-31 ENCOUNTER — Other Ambulatory Visit: Payer: Self-pay | Admitting: Obstetrics and Gynecology

## 2022-01-31 NOTE — Patient Outreach (Signed)
Medicaid Managed Care   Nurse Care Manager Note  01/31/2022 Name:  [name] [name] MRN:  [id number] DOB:  [birth year]  [name] [name] is an 61 y.o. year old female who is a primary patient of [name], MD.  The [facility] Managed Care Coordination team was consulted for assistance with:    Chronic healthcare management needs, DM< HTN, asthma, GERD, anxiety, osteoarthritis  Ms. Walsworth was given information about Medicaid Managed Care Coordination team services today. [name] [name] Patient agreed to services and verbal consent obtained.  Engaged with patient by telephone for follow up visit in response to provider referral for case management and/or care coordination services.   Assessments/Interventions:  Review of past medical history, allergies, medications, health status, including review of consultants reports, laboratory and other test data, was performed as part of comprehensive evaluation and provision of chronic care management services.  SDOH (Social Determinants of Health) assessments and interventions performed: SDOH Interventions    Flowsheet Row Patient Outreach Telephone from 01/31/2022 in [place] Patient Outreach Telephone from 12/28/2021 in [place] Patient Outreach Telephone from 11/18/2021 in [place] Patient Outreach Telephone from 11/09/2021 in [place] Patient Outreach Telephone from 10/29/2021 in [place] Patient Outreach Telephone from 10/28/2021 in [place] Interventions        Food Insecurity Interventions -- Intervention Not Indicated -- -- -- --  Housing Interventions -- -- -- -- Intervention Not Indicated --  Transportation Interventions -- -- -- --  [UHC transportation] -- --  Utilities Interventions --  Intervention Not Indicated -- -- -- --  Financial Strain Interventions Intervention Not Indicated -- -- -- -- Intervention Not Indicated  Physical Activity Interventions Intervention Not Indicated -- -- -- -- Other (Comments)  [s/p shoulder surgery, attending PT]  Stress Interventions -- -- Intervention Not Indicated -- -- --       Care Plan  Allergies  Allergen Reactions   Acetaminophen Other (See Comments)    Inflamed liver, hospitalized     Morphine Sulfate Hives and Shortness Of Breath   Triamterene Hives   Aspirin-Caffeine Other (See Comments)    liver damage; upset stomach   Dyazide [Hydrochlorothiazide W-Triamterene] Hives   Jardiance [Empagliflozin] Diarrhea    (Jardiance) stomach ache    Lisinopril     Cough    Metformin Hcl Er Other (See Comments)     upset stomach    Topamax [Topiramate] Other (See Comments)    Vision disturbances.   Citalopram Itching and Rash   Emtricitabine-Tenofovir Df Rash     Descovy   Losartan Nausea Only and Rash    Pt had rash, worsening dizziness, and nausea after starting losartan, which improved after stopping losartan   Triamterene-Hctz Rash    Medications Reviewed Today     Reviewed by Gayla Medicus, RN (Registered Nurse) on 01/31/22 at 59  Med List Status: <None>   Medication Order Taking? Sig Documenting Provider Last Dose Status Informant  amLODipine (NORVASC) 10 MG tablet [id number] No Take 1 tablet (10 mg total) by mouth daily. [name], MD Taking Active Self  atorvastatin (LIPITOR) 10 MG tablet [id number] No TAKE HALF TABLET BY MOUTH DAILY Katsadouros, Vasilios, MD Taking Active Self  bictegravir-emtricitabine-tenofovir AF (BIKTARVY) 50-200-25 MG TABS tablet [id number] No Take 1 tablet by mouth daily. Star Callas, NP Taking Active Self  buPROP[facility]  XL) 300 MG 24 hr tablet [id number] No TAKE ONE TABLET BY MOUTH DAILY  Patient taking differently: Take 300 mg by mouth every evening.   [name], MD Taking Active Self  cetirizine (ZYRTEC) 10 MG tablet [id number] No Take 2 tablets (20 mg total) by mouth 2 (two) times daily as needed for allergies. Kozlow, Donnamarie Poag, MD Taking Active   chlorhexidine (PERIDEX) 0.12 % solution [id number]  USE AS DIRECTED 15 MLS IN THE MOUTH OR THROAT TWICE A DAY Dixon, Melton Krebs, NP  Active   cholecalciferol (VITAMIN D) 25 MCG (1000 UNIT) tablet [id number] No Take 1 tablet (1,000 Units total) by mouth daily. [name], MD Taking Active   ciclopirox [facility]) 8 % solution [id number]  Apply topically at bedtime. Apply over nail and surrounding skin. Apply daily over previous coat. After seven (7) days, may remove with alcohol and continue cycle. Trula Slade, DPM  Active   cyclobenzaprine (FLEXERIL) 10 MG tablet [id number] No Take 1 tablet (10 mg total) by mouth 3 (three) times daily as needed for muscle spasms. Magnant, Gerrianne Scale, PA-C Taking Active Self  diphenoxylate-atropine (LOMOTIL) 2.5-0.025 MG tablet [id number] No Take 1 tablet by mouth 4 (four) times daily as needed for diarrhea or loose stools. [place] Callas, NP Taking Active Self  famotidine (PEPCID) 40 MG tablet [id number] No Take 1 tablet (40 mg total) by mouth at bedtime. Kozlow, Donnamarie Poag, MD Taking Active   gabapentin (NEURONTIN) 300 MG capsule [id number] No Take 1 capsule (300 mg total) by mouth 2 (two) times daily. Meredith Pel, MD Taking Active   hydrocortisone (CORTEF) 5 MG tablet [id number] No Take 2.5-5 mg by mouth See admin instructions. Take 1 tablet (5 mg) by mouth in the morning (scheduled) & may taken an additional 0.5 tablet (2.5 mg) by mouth in the evening if needed. [provider] Taking Active Self  ibuprofen (ADVIL) 800 MG tablet [id number]  TAKE ONE TABLET BY MOUTH EVERY 8 HOURS AS NEEDED Magnant, Charles L, PA-C  Active   liraglutide (VICTOZA) 18 MG/3ML SOPN [id number] No Inject 1.2 mg into the skin every evening. [provider] Taking Active Self   methocarbamol (ROBAXIN) 500 MG tablet [id number] No Take 1 tablet (500 mg total) by mouth every 8 (eight) hours as needed for muscle spasms. Meredith Pel, MD Taking Active   metoprolol tartrate (LOPRESSOR) 25 MG tablet [id number] No Take 1 tablet (25 mg total) by mouth 2 (two) times daily. [name], MD Taking Active Self  montelukast (SINGULAIR) 10 MG tablet [id number] No Take 1 tablet (10 mg total) by mouth at bedtime. Jiles Prows, MD Taking Active   NEURONTIN 100 MG capsule [id number]  TAKE TWO CAPSULES BY MOUTH THREE TIMES A DAY Kozlow, Donnamarie Poag, MD  Active   nitroGLYCERIN (NITROSTAT) 0.4 MG SL tablet [id number] No Place 1 tablet (0.4 mg total) under the tongue every 5 (five) minutes as needed for chest pain. Isaiah Serge, NP Taking Active Self  nortriptyline (PAMELOR) 10 MG capsule [id number] No Take 2 capsules (20 mg total) by mouth at bedtime. Marcial Pacas, MD Taking Active Self  pantoprazole (PROTONIX) 40 MG tablet [id number] No Take 1 tablet (40 mg total) by mouth 2 (two) times daily. Kozlow, Donnamarie Poag, MD Taking Active   potassium chloride SA (KLOR-CON) 20 MEQ tablet [id number] No Take 20 mEq by mouth in the morning. [provider] Taking Active Self  RESTASIS 0.05 % ophthalmic emulsion [id number] No Place 1 drop into  both eyes 2 (two) times daily. [provider] Taking Active Self  sodium chloride (OCEAN) 0.65 % SOLN nasal spray [id number] No PLACE 1 SPRAY INTO BOTH NOSTRILS AS NEEDED FOR CONGESTION. [name], DO Taking Active Self  SUMAtriptan (IMITREX) 25 MG tablet [id number] No Take 1 tablet (25 mg total) by mouth every 2 (two) hours as needed for migraine. May repeat in 2 hours if headache persists or recurs. Marcial Pacas, MD Taking Active Self  SYMBICORT 160-4.5 MCG/ACT inhaler [id number] No Inhale 2 puffs into the lungs in the morning and at bedtime. Kozlow, Donnamarie Poag, MD Taking Active   triamcinolone (NASACORT) 55 MCG/ACT AERO nasal inhaler [id number]  No Place 1 spray into the nose 2 (two) times daily. 1 spray each nostril 2 times per day Jiles Prows, MD Taking Active   VENTOLIN HFA 108 (90 Base) MCG/ACT inhaler [id number]  INHALE TWO PUFFS BY MOUTH INTO THE LUNGS EVERY 4 HOURS AS NEEDED FOR WHEEZING OR SHORTNESS OF BREATH Kozlow, Donnamarie Poag, MD  Active             Patient Active Problem List   Diagnosis Date Noted   Synovitis of left shoulder    Complete tear of left rotator cuff    Impingement syndrome of left shoulder 07/01/2021   Left leg swelling 07/09/2020   Grief reaction 06/08/2020   Arm pain 02/10/2020   Dizziness on standing 12/27/2019   Generalized anxiety disorder with panic attacks 06/20/2019   Osteoarthritis of thumb, right 11/29/2018   Chronic migraine w/o aura w/o status migrainosus, not intractable 08/27/2018   Disorder of left eustachian tube 05/19/2017   Vertigo of central origin 01/23/2017   Eustachian tube dysfunction, bilateral 06/15/2016   Ear pain, right 06/15/2016   Spondylosis of lumbar region without myelopathy or radiculopathy 10/08/2015   BPPV (benign paroxysmal positional vertigo) 10/02/2015   Liver fibrosis ([place]) 12/04/2014   Pituitary microadenoma ([place]) 08/08/2014   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   Vitamin D deficiency 06/29/2014   Secondary adrenal insufficiency ([place]) 06/29/2014   History of tympanostomy tube placement 02/07/2014   Refusal of blood transfusions as patient is Jehovah's Witness 11/18/2013   Hepatitis C virus infection cured after antiviral drug therapy 03/16/2012   Health care maintenance 12/15/2011   Opioid dependence ([place]) 10/05/2010   HTN (hypertension) 10/05/2010   Hyperlipidemia associated with type 2 diabetes mellitus ([place]) 10/23/2008   HIV disease ([place]) 05/09/2006   Depression 05/09/2006   Allergic rhinitis 05/09/2006   GERD 05/09/2006   Conditions to be addressed/monitored per PCP order:  Chronic healthcare management needs, DM< HTN, asthma, GERD,  anxiety, osteoarthritis, HLD, HIV, depression.  Care Plan : General Plan of Care (Adult)  Updates made by Gayla Medicus, RN since 01/31/2022 12:00 AM     Problem: Health Promotion or Disease Self-Management (General Plan of Care)   Priority: Medium  Onset Date: 08/10/2020     Long-Range Goal: Self-Management Plan Developed   Start Date: 05/07/2020  Expected End Date: 05/03/2022  Recent Progress: On track  Priority: Medium  Note:   Current Barriers:  Chronic Disease Management support and education needs.  01/31/22:  PT completed.  Patient to follow up with Dr. Marlou Sa 10/1.  BP slightly elevated-148/81 today, to recheck and notify provider as needed-has f/u appt with [facility] this month as well as ID.  Nurse Case Manager Clinical Goal(s):  Over the next 30 days, patient will attend all scheduled medical appointments  Interventions:  Inter-disciplinary care team collaboration (  see longitudinal plan of care) Evaluation of current treatment plan and patient's adherence to plan as established by provider. Reviewed medications with patient. Collaborated with pharmacy regarding medications. Discussed plans with patient for ongoing care management follow up and provided patient with direct contact information for care management team Reviewed scheduled/upcoming provider appointments. Pharmacy referral for medication review-completed. Patient provided with [facility] transportation information.  Patient Goals/Self-Care Activities Over the next 30 days, patient will:  -Attends all scheduled provider appointments Calls pharmacy for medication refills Calls provider office for new concerns or questions  Follow Up Plan: The Managed Medicaid care management team will reach out to the patient again over the next 30 business  days.  The patient has been provided with contact information for the Managed Medicaid care management team and has been advised to call with any health related questions or concerns.     Follow Up:  Patient agrees to Care Plan and Follow-up.  Plan: The Managed Medicaid care management team will reach out to the patient again over the next 30 business  days. and The  Patient has been provided with contact information for the Managed Medicaid care management team and has been advised to call with any health related questions or concerns.  Date/time of next scheduled RN care management/care coordination outreach: 03/02/22 at 1030.

## 2022-01-31 NOTE — Patient Instructions (Signed)
Hi [name] [name]-always a pleasure speaking with you, have a beautiful day!!  [name] [name] was given information about Medicaid Managed Care team care coordination services as a part of their [place] Medicaid benefit. [name] [name] verbally consented to engagement with the [facility] Managed Care team.   If you are experiencing a medical emergency, please call 911 or report to your local emergency department or urgent care.   If you have a non-emergency medical problem during routine business hours, please contact your provider's office and ask to speak with a nurse.   For questions related to your [facility], please call: [phone] or visit the homepage here: [email or web address]  If you would like to schedule transportation through your [facility], please call the following number at least 2 days in advance of your appointment: [phone]   Rides for urgent appointments can also be made after hours by calling Member Services.  Call the [place] at [phone], at any time, 24 hours a day, 7 days a week. If you are in danger or need immediate medical attention call 911.  If you would like help to quit smoking, call 1-800-QUIT-NOW 720-564-2123) OR Espaol: 1-855-Djelo-Ya (2-778-242-3536) o para ms informacin haga clic aqu or Text READY to 200-400 to register via text  [name] [name] - following are the goals we discussed in your visit today:   Goals Addressed             This Visit's Progress    Protect My Health       Timeframe:  Long-Range Goal Priority:  Medium Start Date:      05/07/20                       Expected End Date: ongoing             Follow Up Date: 03/03/22 - schedule appointment for vaccines needed due to my age or health - schedule recommended health tests (blood work, mammogram, colonoscopy, pap test) - schedule  and keep appointment for annual check-up    Why is this important?   Screening tests can find diseases early when they are easier to treat.  Your doctor or nurse will talk with you about which tests are important for you.  Getting shots for common diseases like the flu and shingles will help prevent them.    01/31/22:  PT completed, will see Dr. Marlou Sa 10/11, ID 10/17 and [facility] 10/23.   Patient verbalizes understanding of instructions and care plan provided today and agrees to view in [place]. Active MyChart status and patient understanding of how to access instructions and care plan via MyChart confirmed with patient.     The Managed Medicaid care management team will reach out to the patient again over the next 30 business  days.  The  Patient  has been provided with contact information for the Managed Medicaid care management team and has been advised to call with any health related questions or concerns.   Aida Raider RN, BSN [place]  [facility] - Managed Medicaid High Risk [phone].   Following is a copy of your plan of care:  Care Plan : General Plan of Care (Adult)  Updates made by Gayla Medicus, RN since 01/31/2022 12:00 AM     Problem: Health Promotion or Disease Self-Management (General Plan of Care)   Priority: Medium  Onset Date: 08/10/2020  Long-Range Goal: Self-Management Plan Developed   Start Date: 05/07/2020  Expected End Date: 05/03/2022  Recent Progress: On track  Priority: Medium  Note:   Current Barriers:  Chronic Disease Management support and education needs.  01/31/22:  PT completed.  Patient to follow up with Dr. Marlou Sa 10/1.  BP slightly elevated-148/81 today, to recheck and notify provider as needed-has f/u appt with [facility] this month as well as ID.  Nurse Case Manager Clinical Goal(s):  Over the next 30 days, patient will attend all scheduled medical appointments  Interventions:  Inter-disciplinary care  team collaboration (see longitudinal plan of care) Evaluation of current treatment plan and patient's adherence to plan as established by provider. Reviewed medications with patient. Collaborated with pharmacy regarding medications. Discussed plans with patient for ongoing care management follow up and provided patient with direct contact information for care management team Reviewed scheduled/upcoming provider appointments. Pharmacy referral for medication review-completed. Patient provided with [facility] transportation information.  Patient Goals/Self-Care Activities Over the next 30 days, patient will:  -Attends all scheduled provider appointments Calls pharmacy for medication refills Calls provider office for new concerns or questions  Follow Up Plan: The Managed Medicaid care management team will reach out to the patient again over the next 30 business  days.  The patient has been provided with contact information for the Managed Medicaid care management team and has been advised to call with any health related questions or concerns.

## 2022-02-02 ENCOUNTER — Encounter: Payer: Self-pay | Admitting: Orthopedic Surgery

## 2022-02-02 ENCOUNTER — Ambulatory Visit (INDEPENDENT_AMBULATORY_CARE_PROVIDER_SITE_OTHER): Payer: Medicaid Other | Admitting: Orthopedic Surgery

## 2022-02-02 ENCOUNTER — Telehealth: Payer: Self-pay | Admitting: Orthopedic Surgery

## 2022-02-02 DIAGNOSIS — M79602 Pain in left arm: Secondary | ICD-10-CM

## 2022-02-02 MED ORDER — NABUMETONE 500 MG PO TABS: 500.0000 mg | ORAL_TABLET | Freq: Two times a day (BID) | ORAL | 0 refills | Status: DC | PRN

## 2022-02-02 NOTE — Telephone Encounter (Signed)
Pt called requesting to contact pharmacy and give pre auth for her medication to be released to her. Please call pt when done at [phone].

## 2022-02-02 NOTE — Telephone Encounter (Signed)
I called pharm and was advised pt's Relafen needs prior auth given number for [facility] [phone] called and initiated prior auth by phone. Advise by rep to give 1-2 business for decision and we will be notified by fax.

## 2022-02-02 NOTE — Progress Notes (Signed)
Office Visit Note   Patient: [name] [name]           Date of Birth: January 09, 1961           MRN: [id number] Visit Date: 02/02/2022 Requested by: [name], MD [address] [place],  [place] [address] PCP: [name], MD  Subjective: Chief Complaint  Patient presents with   Left Shoulder - Routine Post Op    left shoulder arthroscopy with rotator cuff repair on 08/31/2021.     HPI: [name] [name] is a 61 y.o. female who presents to the office reporting continued left shoulder pain.  She underwent left shoulder arthroscopy and debridement and rotator cuff tear repair 523.  Was noted to have significant glenohumeral degenerative changes at that time as well.  Still having symptoms despite 20 weeks of physical therapy.  She finished 01/12/2022.  Taking ibuprofen without relief.  She also has had Voltaren in the past without help.  She cannot really AB duct all the way up on the side.  Really cannot get fully behind her back.  Pain does wake her from sleep at night.  Overall however she is improved compared to her preop status but is just having some persistent symptoms which are not inconsistent with the operative degenerative findings noted in the joint at the time of surgery.    .                ROS: All systems reviewed are negative as they relate to the chief complaint within the history of present illness.  Patient denies fevers or chills.  Assessment & Plan: Visit Diagnoses:  1. Left arm pain     Plan: Impression is left shoulder pain with some degenerative changes present.  Motion is reasonable but she is having a little weakness.  She is not interested in any more surgery.  No surgical intervention for her would be reverse replacement based on rotator cuff pathology as well as glenohumeral arthritis.  For now we will try Relafen.  She does get fairly frequent checks on her liver and kidney function.  500 twice a day as needed pain #60 with 2 refills follow-up as  needed.  Follow-Up Instructions: No follow-ups on file.   Orders:  No orders of the defined types were placed in this encounter.  No orders of the defined types were placed in this encounter.     Procedures: No procedures performed   Clinical Data: No additional findings.  Objective: Vital Signs: There were no vitals taken for this visit.  Physical Exam:  Constitutional: Patient appears well-developed HEENT:  Head: Normocephalic Eyes:EOM are normal Neck: Normal range of motion Cardiovascular: Normal rate Pulmonary/chest: Effort normal Neurologic: Patient is alert Skin: Skin is warm Psychiatric: Patient has normal mood and affect  Ortho Exam: Ortho exam demonstrates passive range of motion on the left of 60/90/160.  She has 5-5 external rotation strength on the left compared to the right.  Subscap strength also 5- out of 5.  Slight coarseness anteriorly with internal/external rotation of the left arm.  Incision is intact.  Neck range of motion otherwise intact.  Specialty Comments:  No specialty comments available.  Imaging: No results found.   PMFS History: Patient Active Problem List   Diagnosis Date Noted   Synovitis of left shoulder    Complete tear of left rotator cuff    Impingement syndrome of left shoulder 07/01/2021   Left leg swelling 07/09/2020   Grief reaction 06/08/2020  Arm pain 02/10/2020   Dizziness on standing 12/27/2019   Generalized anxiety disorder with panic attacks 06/20/2019   Osteoarthritis of thumb, right 11/29/2018   Chronic migraine w/o aura w/o status migrainosus, not intractable 08/27/2018   Disorder of left eustachian tube 05/19/2017   Vertigo of central origin 01/23/2017   Eustachian tube dysfunction, bilateral 06/15/2016   Ear pain, right 06/15/2016   Spondylosis of lumbar region without myelopathy or radiculopathy 10/08/2015   BPPV (benign paroxysmal positional vertigo) 10/02/2015   Liver fibrosis ([place]) 12/04/2014    Pituitary microadenoma ([place]) 08/08/2014   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   Vitamin D deficiency 06/29/2014   Secondary adrenal insufficiency ([place]) 06/29/2014   History of tympanostomy tube placement 02/07/2014   Refusal of blood transfusions as patient is Jehovah's Witness 11/18/2013   Hepatitis C virus infection cured after antiviral drug therapy 03/16/2012   Health care maintenance 12/15/2011   Opioid dependence ([place]) 10/05/2010   HTN (hypertension) 10/05/2010   Hyperlipidemia associated with type 2 diabetes mellitus ([place]) 10/23/2008   HIV disease ([place]) 05/09/2006   Depression 05/09/2006   Allergic rhinitis 05/09/2006   GERD 05/09/2006   Past Medical History:  Diagnosis Date   Allergic rhinitis 05/09/2006   Allergy    Anxiety    Arthritis    Asthma    CHF (congestive heart failure) (HCC)    Chronic back pain    Diabetes mellitus without complication (HCC)    GERD (gastroesophageal reflux disease)    Heart murmur    as a child   Hepatitis C    genotype 1b, 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 s/p 12 week course of simeprevir and sofosbuvir between October 2014 and January 2015 with resolution.   History of shingles    HIV infection ([place])    1994   Hyperlipidemia    no meds taken now   Hypertension    Migraine    Pituitary microadenoma ([place]) 08/08/2014   Pneumonia    Prediabetes    Refusal of blood transfusions as patient is Jehovah's Witness    Secondary adrenal insufficiency ([place]) 06/29/2014   Urticaria     Family History  Problem Relation Age of Onset   Heart disease Mother    Diabetes Mother    Stroke Mother    Heart disease Father    Stroke Father    Diabetes Father    Hepatitis Sister        hcv   Asthma Sister    Allergic rhinitis Sister    Stroke Other    Colon polyps Brother    Renal Disease Brother    Cancer Sister        lung   Asthma Sister    Allergic rhinitis Sister    Cancer Maternal Aunt    Cancer Maternal  Aunt    Colon cancer Neg Hx    Esophageal cancer Neg Hx    Stomach cancer Neg Hx    Rectal cancer Neg Hx    Angioedema Neg Hx    Eczema Neg Hx    Urticaria Neg Hx     Past Surgical History:  Procedure Laterality Date   BUNIONECTOMY     b/l   COLECTOMY     2003 for diverticulitis, had colostomy bag and then reversed   COLONOSCOPY     HAND SURGERY     HAND SURGERY Right    right thumb surgery Dr Weingold 2021   [facility]  SHOULDER ARTHROSCOPY WITH OPEN ROTATOR CUFF REPAIR AND DISTAL CLAVICLE ACROMINECTOMY Left 08/31/2021   Procedure: LEFT SHOULDER ARTHROSCOPY, DEBRIDEMENT,  ROTATOR CUFF TEAR REPAIR;  Surgeon: Meredith Pel, MD;  Location: [facility];  Service: Orthopedics;  Laterality: Left;   SHOULDER SURGERY     left   TONSILLECTOMY     Social History   Occupational History   Occupation: Disabled  Tobacco Use   Smoking status: Former    Years: 20.00    Types: Cigarettes    Quit date: 04/26/2007    Years since quitting: 14.7   Smokeless tobacco: Never   Tobacco comments:    QUIT 2009  Vaping Use   Vaping Use: Never used  Substance and Sexual Activity   Alcohol use: No    Alcohol/week: 0.0 standard drinks of alcohol   Drug use: Not Currently    Types: Marijuana    Comment: cannabis in the past   Sexual activity: Not Currently    Partners: Male    Birth control/protection: Condom    Comment: declined condoms

## 2022-02-08 ENCOUNTER — Ambulatory Visit (INDEPENDENT_AMBULATORY_CARE_PROVIDER_SITE_OTHER): Payer: Medicaid Other | Admitting: Infectious Diseases

## 2022-02-08 ENCOUNTER — Other Ambulatory Visit: Payer: Self-pay

## 2022-02-08 ENCOUNTER — Encounter: Payer: Self-pay | Admitting: Infectious Diseases

## 2022-02-08 VITALS — BP 160/111 | HR 88 | Resp 16 | Ht 62.0 in | Wt 176.9 lb

## 2022-02-08 DIAGNOSIS — I10 Essential (primary) hypertension: Secondary | ICD-10-CM

## 2022-02-08 DIAGNOSIS — Z0000 Encounter for general adult medical examination without abnormal findings: Secondary | ICD-10-CM

## 2022-02-08 DIAGNOSIS — B20 Human immunodeficiency virus [HIV] disease: Secondary | ICD-10-CM

## 2022-02-08 DIAGNOSIS — Z23 Encounter for immunization: Secondary | ICD-10-CM | POA: Diagnosis not present

## 2022-02-08 DIAGNOSIS — Z87891 Personal history of nicotine dependence: Secondary | ICD-10-CM | POA: Diagnosis not present

## 2022-02-08 NOTE — Assessment & Plan Note (Addendum)
Flu and COVID booster today. She can get RSV vaccine in 2 months per current CDC recommendations. Due for pap smear for q93yrscreening.  Abnormal DEXA in 2020 with T score of spine -2.5 indicating osteoporosis - will refer back to PCP for management

## 2022-02-08 NOTE — Patient Instructions (Addendum)
Lovely to see you   We gave you your COVID booster and Flu shot today   For the RSV vaccine - please wait 2 months between these shots - you can get it after April 10, 2022.   Please schedule a follow up with Dr. Collene Gobble to discuss your blood pressure and recommendations for osteoporosis treatment (your last density scan in 2020 looks abnormal).   Will have our research team reach out to you about the study we chatted about regarding switching from Ethel to Dovato once a day.   Otherwise we can plan to see you back here in 6-9 months with me :)   Stop by the lab on your way out please   Happy Holidays!

## 2022-02-08 NOTE — Progress Notes (Signed)
Subjective:    [name], female    DOB: Jul 08, 1960, 61 y.o.   MRN: [id number]   Chief Complaint  Patient presents with   Follow-up    Will get Flu and Covid shots today.       Brief Narrative:  [name] [name] is a 61 y.o. female with well controlled HIV (VL 36, CD4 800) on Biktarvy. She is a type 2 diabetic, on chronic prednisone for Addison's disease a h/o pituitary adenoma.   Previous regimens for HIV treatment include Lexiva/Norvir/Truvada, Isentress/Truvada (rashes), Prezcobix/Truvada (rash).  History of treated Hepatitis C (completed 04/2013 with [facility]).     HPI:  Doing well since our last visit together. Main concern has been her left shoulder - she has been working with Dr. Marlou Sa for this and has severe rotator cuff pathology that limits her mobility and makes her ROM difficult.  Working with PT currently. Recommended she consider a total shoulder replacement at some point - She is not quite ready to proceed with this. She really wants the function of the replaced joint but stressful at the moment. Has also noticed it has worsened some neck pain that is long standing.   Hydrocortisone ongoing 2.5 - 5 mg dosed daily for Addison's. Trying to stay on the lowest dose possible.   Had abnormal DEXA in 2020 - T score -2.5 indicating osteoporosis. Only taking vitamin D at this time.   Would like the COVID booster and flu shot. Also questions about the RSV vaccine and timing as to when she can take that.     Review of Systems  Constitutional:  Negative for chills and fever.  HENT:  Negative for sore throat.        No dental problems  Respiratory:  Negative for cough.   Cardiovascular:  Negative for chest pain and leg swelling.  Gastrointestinal:  Negative for abdominal pain, diarrhea and vomiting.  Genitourinary:  Negative for dysuria and flank pain.  Musculoskeletal:  Positive for neck pain. Negative for myalgias.       Limited ROM left shoulder and pain   Skin:   Negative for rash.  Neurological:  Negative for dizziness and headaches.  Psychiatric/Behavioral:  The patient is not nervous/anxious.        Objective:   Physical Exam Vitals reviewed.  Constitutional:      Appearance: She is well-developed.     Comments: Seated comfortably in chair.   HENT:     Mouth/Throat:     Mouth: No oral lesions.     Dentition: Normal dentition. No dental abscesses.     Pharynx: No oropharyngeal exudate.  Cardiovascular:     Rate and Rhythm: Normal rate and regular rhythm.     Heart sounds: Normal heart sounds.  Pulmonary:     Effort: Pulmonary effort is normal.     Breath sounds: Normal breath sounds.  Abdominal:     General: There is no distension.     Palpations: Abdomen is soft.     Tenderness: There is no abdominal tenderness.  Lymphadenopathy:     Cervical: No cervical adenopathy.  Skin:    General: Skin is warm and dry.     Findings: No rash.  Neurological:     Mental Status: She is alert and oriented to person, place, and time.  Psychiatric:        Judgment: Judgment normal.     Comments: In good spirits today and engaged in care discussion  Vitals:   02/08/22 1000  Weight: 176 lb 14.4 oz (80.2 kg)  Height: '5\' 2"'$  (1.575 m)   Filed Weights   02/08/22 1000  Weight: 176 lb 14.4 oz (80.2 kg)       Assessment & Plan:   Problem List Items Addressed This Visit       Unprioritized   HIV disease ([place]) - Primary (Chronic)    Very well controlled on once daily Biktarvy. No concerns with access or adherence to medication. They are tolerating the medication well without side effects. Pertinent lab tests ordered today.   I think she would be a good candidate to switch to Dovato to remove TAF component given need for NSAIDS and other medications. This would overall reduce the r/t kidneys long term. Will see if we can recruit her for research study.  No changes to insurance coverage.  No dental needs today.  No concern over  anxious/depressed mood.  Sexual health and family planning discussed - no needs - she is due for pap smear.  Vaccines updated today - see health maintenance section.   Return in about 9 months (around 11/09/2022).        Relevant Orders   HIV 1 RNA quant-no reflex-bld   T-helper cells (CD4) count   COMPLETE METABOLIC PANEL WITH GFR   HTN (hypertension) (Chronic)    Message sent to PCP and I asked [facility] to schedule a follow up with them. Has been consistently elevated since last OV with them and well over target today.       Health care maintenance    Flu and COVID booster today. She can get RSV vaccine in 2 months per current CDC recommendations. Due for pap smear for q57yrscreening.  Abnormal DEXA in 2020 with T score of spine -2.5 indicating osteoporosis - will refer back to PCP for management       Other Visit Diagnoses     Need for immunization against influenza       Relevant Orders   Flu Vaccine QUAD 634moM (Fluarix, Fluzone & Alfiuria Quad PF) (Completed)   Encounter for immunization       Relevant Orders   Pfizer Fall 2023 Covid-19 Vaccine 1289yrnd older (Completed)      Return in about 9 months (around 11/09/2022).   [name] MadeiraSN, NP-C [email or web address] Pager: [phone]: [phone]

## 2022-02-08 NOTE — Assessment & Plan Note (Signed)
Very well controlled on once daily Biktarvy. No concerns with access or adherence to medication. They are tolerating the medication well without side effects. Pertinent lab tests ordered today.   I think she would be a good candidate to switch to Dovato to remove TAF component given need for NSAIDS and other medications. This would overall reduce the r/t kidneys long term. Will see if we can recruit her for research study.  No changes to insurance coverage.  No dental needs today.  No concern over anxious/depressed mood.  Sexual health and family planning discussed - no needs - she is due for pap smear.  Vaccines updated today - see health maintenance section.   Return in about 9 months (around 11/09/2022).

## 2022-02-08 NOTE — Assessment & Plan Note (Signed)
Message sent to PCP and I asked Jannelle to schedule a follow up with them. Has been consistently elevated since last OV with them and well over target today.

## 2022-02-09 LAB — T-HELPER CELLS (CD4) COUNT (NOT AT ARMC)
CD4 % Helper T Cell: 28 % — ABNORMAL LOW (ref 33–65)
CD4 T Cell Abs: 487 /uL (ref 400–1790)

## 2022-02-11 LAB — COMPLETE METABOLIC PANEL WITH GFR
AG Ratio: 1.6 (calc) (ref 1.0–2.5)
ALT: 29 U/L (ref 6–29)
AST: 19 U/L (ref 10–35)
Albumin: 4.6 g/dL (ref 3.6–5.1)
Alkaline phosphatase (APISO): 82 U/L (ref 37–153)
BUN: 11 mg/dL (ref 7–25)
CO2: 27 mmol/L (ref 20–32)
Calcium: 9.6 mg/dL (ref 8.6–10.4)
Chloride: 103 mmol/L (ref 98–110)
Creat: 0.9 mg/dL (ref 0.50–1.05)
Globulin: 2.9 g/dL (calc) (ref 1.9–3.7)
Glucose, Bld: 116 mg/dL — ABNORMAL HIGH (ref 65–99)
Potassium: 3.8 mmol/L (ref 3.5–5.3)
Sodium: 141 mmol/L (ref 135–146)
Total Bilirubin: 0.3 mg/dL (ref 0.2–1.2)
Total Protein: 7.5 g/dL (ref 6.1–8.1)
eGFR: 73 mL/min/{1.73_m2} (ref >=60)

## 2022-02-11 LAB — HIV-1 RNA QUANT-NO REFLEX-BLD
HIV 1 RNA Quant: NOT DETECTED Copies/mL
HIV-1 RNA Quant, Log: NOT DETECTED Log cps/mL

## 2022-02-14 ENCOUNTER — Other Ambulatory Visit: Payer: Self-pay | Admitting: Allergy and Immunology

## 2022-02-14 ENCOUNTER — Encounter: Payer: Self-pay | Admitting: Physician Assistant

## 2022-02-14 ENCOUNTER — Ambulatory Visit: Payer: Medicaid Other | Attending: Physician Assistant | Admitting: Physician Assistant

## 2022-02-14 VITALS — BP 140/84 | HR 68 | Ht 61.0 in | Wt 178.6 lb

## 2022-02-14 DIAGNOSIS — R072 Precordial pain: Secondary | ICD-10-CM

## 2022-02-14 DIAGNOSIS — I10 Essential (primary) hypertension: Secondary | ICD-10-CM

## 2022-02-14 DIAGNOSIS — L299 Pruritus, unspecified: Secondary | ICD-10-CM

## 2022-02-14 DIAGNOSIS — E1169 Type 2 diabetes mellitus with other specified complication: Secondary | ICD-10-CM | POA: Diagnosis not present

## 2022-02-14 DIAGNOSIS — E785 Hyperlipidemia, unspecified: Secondary | ICD-10-CM

## 2022-02-14 MED ORDER — METOPROLOL TARTRATE 50 MG PO TABS: 50.0000 mg | ORAL_TABLET | Freq: Two times a day (BID) | ORAL | 3 refills | Status: DC

## 2022-02-14 NOTE — Assessment & Plan Note (Signed)
Blood pressure is above target.  She has a history of allergy to thiazide diuretics as well as ACE inhibitor and ARB.  She is on max dose amlodipine.  Increase metoprolol tartrate to 50 mg twice daily.

## 2022-02-14 NOTE — Assessment & Plan Note (Signed)
Myoview in April 2022 was low risk.  Echocardiogram in April 2022 demonstrated normal EF and moderate LVH.  Her symptoms have been fairly stable for the past year without change.  They seem to be more consistent with asthma than ischemia.  I have advised her to notify us if her symptoms should change.  If she has progressively worsening symptoms, we could consider coronary CTA.

## 2022-02-14 NOTE — Progress Notes (Signed)
Cardiology Office Note:    Date:  02/14/2022   ID:  [name] [name], DOB [birth year], MRN [id number]  PCP:  [name], [place] Providers Cardiologist:  Jenkins Rouge, MD     Referring MD: [name], MD   Chief Complaint:  Follow-up for high blood pressure    Patient Profile: Diabetes mellitus  Hyperlipidemia  Hypertension  Asthma  Hepatitis C HIV Pituitary adenoma  Addison's disease; on steroids  GERD  Chronic kidney disease  Hx of Pancreatitis  Jehovah's witness   Prior CV Studies:   ABIs 01/13/2022 Normal  Echocardiogram 08/18/20 EF 60-65, no RWMA, moderate LVH, normal RVSF   GATED SPECT MYO PERF W/LEXISCAN STRESS 1D 07/27/2020 EF 62 No ischemia or infarction; low risk  History of Present Illness:   [name] [name] is a 61 y.o. female with the above problem list.  She was last seen in July 2022.  She returns for follow-up. She is here alone. She continues to note chest tightness with exertion at time as well as dyspnea on exertion. She has had these symptoms for over a year without change. Her stress test last year was low risk. She notes improved symptoms with her rescue inhaler. She has not had syncope.  She did have some lower extremity edema recently but this resolved.  She sleeps on an incline chronically.  This is unchanged.       Past Medical History:  Diagnosis Date   Allergic rhinitis 05/09/2006   Allergy    Anxiety    Arthritis    Asthma    CHF (congestive heart failure) (HCC)    Chronic back pain    Diabetes mellitus without complication (HCC)    GERD (gastroesophageal reflux disease)    Heart murmur    as a child   Hepatitis C    genotype 1b, 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 s/p 12 week course of simeprevir and sofosbuvir between October 2014 and January 2015 with resolution.   History of shingles    HIV infection ([place])    1994   Hyperlipidemia    no meds taken now   Hypertension    Migraine     Pituitary microadenoma ([place]) 08/08/2014   Pneumonia    Prediabetes    Refusal of blood transfusions as patient is Jehovah's Witness    Secondary adrenal insufficiency ([place]) 06/29/2014   Urticaria    Current Medications: Current Meds  Medication Sig   amLODipine (NORVASC) 10 MG tablet Take 1 tablet (10 mg total) by mouth daily.   atorvastatin (LIPITOR) 10 MG tablet TAKE HALF TABLET BY MOUTH DAILY   bictegravir-emtricitabine-tenofovir AF (BIKTARVY) 50-200-25 MG TABS tablet Take 1 tablet by mouth daily.   buPROPion (WELLBUTRIN XL) 300 MG 24 hr tablet TAKE ONE TABLET BY MOUTH DAILY   cetirizine (ZYRTEC) 10 MG tablet Take 2 tablets (20 mg total) by mouth 2 (two) times daily as needed for allergies.   chlorhexidine (PERIDEX) 0.12 % solution USE AS DIRECTED 15 MLS IN THE MOUTH OR THROAT TWICE A DAY   cholecalciferol (VITAMIN D) 25 MCG (1000 UNIT) tablet Take 1 tablet (1,000 Units total) by mouth daily.   ciclopirox (PENLAC) 8 % solution Apply topically at bedtime. Apply over nail and surrounding skin. Apply daily over previous coat. After seven (7) days, may remove with alcohol and continue cycle.   cyclobenzaprine (FLEXERIL) 10 MG tablet Take 1 tablet (10 mg total) by mouth 3 (three) times daily as needed  for muscle spasms.   diphenoxylate-atropine (LOMOTIL) 2.5-0.025 MG tablet Take 1 tablet by mouth 4 (four) times daily as needed for diarrhea or loose stools.   famotidine (PEPCID) 40 MG tablet Take 1 tablet (40 mg total) by mouth at bedtime.   gabapentin (NEURONTIN) 300 MG capsule Take 1 capsule (300 mg total) by mouth 2 (two) times daily.   hydrocortisone (CORTEF) 5 MG tablet Take 2.5-5 mg by mouth See admin instructions. Take 1 tablet (5 mg) by mouth in the morning (scheduled) & may taken an additional 0.5 tablet (2.5 mg) by mouth in the evening if needed.   liraglutide (VICTOZA) 18 MG/3ML SOPN Inject 1.2 mg into the skin every evening.   methocarbamol (ROBAXIN) 500 MG tablet Take 1 tablet (500  mg total) by mouth every 8 (eight) hours as needed for muscle spasms.   metoprolol tartrate (LOPRESSOR) 50 MG tablet Take 1 tablet (50 mg total) by mouth 2 (two) times daily.   montelukast (SINGULAIR) 10 MG tablet Take 1 tablet (10 mg total) by mouth at bedtime.   nabumetone (RELAFEN) 500 MG tablet Take 1 tablet (500 mg total) by mouth 2 (two) times daily as needed.   NEURONTIN 100 MG capsule TAKE TWO CAPSULES BY MOUTH THREE TIMES A DAY   nitroGLYCERIN (NITROSTAT) 0.4 MG SL tablet Place 1 tablet (0.4 mg total) under the tongue every 5 (five) minutes as needed for chest pain.   nortriptyline (PAMELOR) 10 MG capsule Take 2 capsules (20 mg total) by mouth at bedtime.   pantoprazole (PROTONIX) 40 MG tablet Take 1 tablet (40 mg total) by mouth 2 (two) times daily.   potassium chloride SA (KLOR-CON) 20 MEQ tablet Take 20 mEq by mouth in the morning.   RESTASIS 0.05 % ophthalmic emulsion Place 1 drop into both eyes 2 (two) times daily.   sodium chloride (OCEAN) 0.65 % SOLN nasal spray PLACE 1 SPRAY INTO BOTH NOSTRILS AS NEEDED FOR CONGESTION.   SUMAtriptan (IMITREX) 25 MG tablet Take 1 tablet (25 mg total) by mouth every 2 (two) hours as needed for migraine. May repeat in 2 hours if headache persists or recurs.   SYMBICORT 160-4.5 MCG/ACT inhaler Inhale 2 puffs into the lungs in the morning and at bedtime.   triamcinolone (NASACORT) 55 MCG/ACT AERO nasal inhaler Place 1 spray into the nose 2 (two) times daily. 1 spray each nostril 2 times per day   VENTOLIN HFA 108 (90 Base) MCG/ACT inhaler INHALE TWO PUFFS BY MOUTH INTO THE LUNGS EVERY 4 HOURS AS NEEDED FOR WHEEZING OR SHORTNESS OF BREATH   [DISCONTINUED] metoprolol tartrate (LOPRESSOR) 25 MG tablet Take 1 tablet (25 mg total) by mouth 2 (two) times daily.    Allergies:   Acetaminophen, Morphine sulfate, Triamterene, Aspirin-caffeine, Dyazide [hydrochlorothiazide w-triamterene], Jardiance [empagliflozin], Lisinopril, Metformin hcl er, Topamax  [topiramate], Citalopram, Emtricitabine-tenofovir df, Losartan, and Triamterene-hctz   Social History   Tobacco Use   Smoking status: Former    Years: 20.00    Types: Cigarettes    Quit date: 04/26/2007    Years since quitting: 14.8   Smokeless tobacco: Never   Tobacco comments:    QUIT 2009  Vaping Use   Vaping Use: Never used  Substance Use Topics   Alcohol use: No    Alcohol/week: 0.0 standard drinks of alcohol   Drug use: Not Currently    Types: Marijuana    Comment: cannabis in the past    Family Hx: The patient's family history includes Allergic rhinitis in her sister  and sister; Asthma in her sister and sister; Cancer in her maternal aunt, maternal aunt, and sister; Colon polyps in her brother; Diabetes in her father and mother; Heart disease in her father and mother; Hepatitis in her sister; Renal Disease in her brother; Stroke in her father, mother, and another family member. There is no history of Colon cancer, Esophageal cancer, Stomach cancer, Rectal cancer, Angioedema, Eczema, or Urticaria.  Review of Systems  Respiratory:  Negative for snoring.   Gastrointestinal:  Negative for hematochezia.  Genitourinary:  Negative for hematuria.  Psychiatric/Behavioral:  The patient has insomnia.      EKGs/Labs/Other Test Reviewed:    EKG:  EKG is not ordered today.    Recent Labs: 08/31/2021: Hemoglobin 14.6; Platelets 223 02/08/2022: ALT 29; BUN 11; Creat 0.90; Potassium 3.8; Sodium 141   Recent Lipid Panel No results for input(s): "CHOL", "TRIG", "HDL", "VLDL", "LDLCALC", "LDLDIRECT" in the last 8760 hours.   Risk Assessment/Calculations/Metrics:         HYPERTENSION CONTROL Vitals:   02/14/22 1323 02/14/22 1404  BP: (!) 142/72 (!) 140/84    The patient's blood pressure is elevated above target today.  In order to address the patient's elevated BP: A current anti-hypertensive medication was adjusted today.       Physical Exam:    VS:  BP (!) 140/84   Pulse  68   Ht '5\' 1"'$  (1.549 m)   Wt 178 lb 9.6 oz (81 kg)   SpO2 94%   BMI 33.75 kg/m     Wt Readings from Last 3 Encounters:  02/14/22 178 lb 9.6 oz (81 kg)  02/08/22 176 lb 14.4 oz (80.2 kg)  12/23/21 178 lb 9.6 oz (81 kg)    Constitutional:      Appearance: Healthy appearance. Not in distress.  Neck:     Vascular: JVD normal.  Pulmonary:     Effort: Pulmonary effort is normal.     Breath sounds: No wheezing. No rales.  Cardiovascular:     Normal rate. Regular rhythm. Normal S1. Normal S2.      Murmurs: There is no murmur.  Edema:    Peripheral edema absent.  Abdominal:     Palpations: Abdomen is soft.  Skin:    General: Skin is warm and dry.  Neurological:     Mental Status: Alert and oriented to person, place and time.          ASSESSMENT & PLAN:   HTN (hypertension) Blood pressure is above target.  She has a history of allergy to thiazide diuretics as well as ACE inhibitor and ARB.  She is on max dose amlodipine.  Increase metoprolol tartrate to 50 mg twice daily.  Hyperlipidemia associated with type 2 diabetes mellitus ([place]) Managed by primary care.  LDL in October 2022 was 91.  Triglycerides were 238.  We discussed alternative medication such as ezetimibe and PCSK9 inhibitors.  I have asked her to discuss these with her primary care provider.  Precordial pain Myoview in April 2022 was low risk.  Echocardiogram in April 2022 demonstrated normal EF and moderate LVH.  Her symptoms have been fairly stable for the past year without change.  They seem to be more consistent with asthma than ischemia.  I have advised her to notify us if her symptoms should change.  If she has progressively worsening symptoms, we could consider coronary CTA.            Dispo:  Return in about 1 year (around 02/15/2023)  for Routine Follow Up with Dr. Johnsie Cancel, or Richardson Dopp, PA-C.   Medication Adjustments/Labs and Tests Ordered: Current medicines are reviewed at length with the patient today.   Concerns regarding medicines are outlined above.  Tests Ordered: No orders of the defined types were placed in this encounter.  Medication Changes: Meds ordered this encounter  Medications   metoprolol tartrate (LOPRESSOR) 50 MG tablet    Sig: Take 1 tablet (50 mg total) by mouth 2 (two) times daily.    Dispense:  180 tablet    Refill:  3   Signed, Richardson Dopp, PA-C  02/14/2022 2:08 PM    [facility] [address], [place], [place]  [address] Phone: [phone]; Fax: [phone]

## 2022-02-14 NOTE — Patient Instructions (Signed)
Medication Instructions:  Your physician has recommended you make the following change in your medication:   INCREASE the Metoprolol to 50 mg twice a day   *If you need a refill on your cardiac medications before your next appointment, please call your pharmacy*   Lab Work: None ordered  If you have labs (blood work) drawn today and your tests are completely normal, you will receive your results only by: [place] (if you have MyChart) OR A paper copy in the mail If you have any lab test that is abnormal or we need to change your treatment, we will call you to review the results.   Testing/Procedures: None ordered   Follow-Up: At [facility], you and your health needs are our priority.  As part of our continuing mission to provide you with exceptional heart care, we have created designated Provider Care Teams.  These Care Teams include your primary Cardiologist (physician) and Advanced Practice Providers (APPs -  Physician Assistants and Nurse Practitioners) who all work together to provide you with the care you need, when you need it.  We recommend signing up for the patient portal called "MyChart".  Sign up information is provided on this After Visit Summary.  MyChart is used to connect with patients for Virtual Visits (Telemedicine).  Patients are able to view lab/test results, encounter notes, upcoming appointments, etc.  Non-urgent messages can be sent to your provider as well.   To learn more about what you can do with MyChart, go to [email or web address].    Your next appointment:   1 year(s)  The format for your next appointment:   In Person  Provider:   Jenkins Rouge, MD  or Richardson Dopp, PA-C         Other Instructions   Important Information About Sugar

## 2022-02-14 NOTE — Assessment & Plan Note (Signed)
Managed by primary care.  LDL in October 2022 was 91.  Triglycerides were 238.  We discussed alternative medication such as ezetimibe and PCSK9 inhibitors.  I have asked her to discuss these with her primary care provider.

## 2022-02-18 ENCOUNTER — Encounter: Payer: Self-pay | Admitting: Internal Medicine

## 2022-02-18 ENCOUNTER — Ambulatory Visit: Payer: Medicaid Other | Admitting: Internal Medicine

## 2022-02-18 VITALS — BP 134/74 | HR 76 | Temp 97.8°F | Ht 61.0 in | Wt 179.0 lb

## 2022-02-18 DIAGNOSIS — Z87891 Personal history of nicotine dependence: Secondary | ICD-10-CM | POA: Diagnosis not present

## 2022-02-18 DIAGNOSIS — Z0000 Encounter for general adult medical examination without abnormal findings: Secondary | ICD-10-CM

## 2022-02-18 DIAGNOSIS — I10 Essential (primary) hypertension: Secondary | ICD-10-CM | POA: Diagnosis not present

## 2022-02-18 DIAGNOSIS — E785 Hyperlipidemia, unspecified: Secondary | ICD-10-CM | POA: Diagnosis not present

## 2022-02-18 DIAGNOSIS — M25473 Effusion, unspecified ankle: Secondary | ICD-10-CM | POA: Insufficient documentation

## 2022-02-18 DIAGNOSIS — R7303 Prediabetes: Secondary | ICD-10-CM | POA: Insufficient documentation

## 2022-02-18 DIAGNOSIS — M25472 Effusion, left ankle: Secondary | ICD-10-CM

## 2022-02-18 DIAGNOSIS — E1169 Type 2 diabetes mellitus with other specified complication: Secondary | ICD-10-CM | POA: Diagnosis not present

## 2022-02-18 HISTORY — DX: Prediabetes: R73.03

## 2022-02-18 LAB — POCT GLYCOSYLATED HEMOGLOBIN (HGB A1C): Hemoglobin A1C: 6.4 % — AB (ref 4.0–5.6)

## 2022-02-18 LAB — GLUCOSE, CAPILLARY: Glucose-Capillary: 140 mg/dL — ABNORMAL HIGH (ref 70–99)

## 2022-02-18 NOTE — Assessment & Plan Note (Signed)
Patient reports intermittent painless swelling of her left ankle. She says this has been a chronic problem for her. Only left ankle swells. She does not have any erythema or pain in ankle. She has been using compression stocking which helps and elevation also helps resolve swelling.   Ankle without swelling today. No pain or tenderness and ROM is intact.   Likely venous insufficiency. Low concern for DVT given intermittent and chronic nature of this swelling. Will plan to continue stockings and elevation.

## 2022-02-18 NOTE — Assessment & Plan Note (Signed)
Patient asking to have a1c checked today. Past a1c from 2020 at 6.1.  Rechecked with value 6.4. Unlikely to receive much benefit from medication at this time. Would advise continued diet and lifestyle control.

## 2022-02-18 NOTE — Progress Notes (Signed)
CC: a1c, bp, meds  HPI:[name] [name] is a 61 y.o. female who presents for evaluation of a1c, bp, meds. Please see individual problem based A/P for details.  Patient reports intermittent painless swelling of her left ankle. She says this has been a chronic problem for her. Only left ankle swells. She does not have any erythema or pain in ankle. She has been using compression stocking which helps and elevation also helps resolve swelling.   Patient currently taking 1/2tablet of her statin. She has not been able to tolerate full dose due to reported dizzines, lightheadedness. Has been compliant with statin. She is asking about injectable medicine for cholesterol.  Patient takes amlodipine and metoprolol. Reports recent increase in metop. She has been compliant with medicines. Says that she checks BP at home and that it is typically 120s No complaints.   Asking about PNA shot. Had flu shot. Also asking about a1c.  Depression, PHQ-9: Based on the patients  [place] Visit from 06/30/2021 in [place]  PHQ-9 Total Score 9      score we have .  Past Medical History:  Diagnosis Date   Allergic rhinitis 05/09/2006   Allergy    Anxiety    Arthritis    Asthma    CHF (congestive heart failure) (HCC)    Chronic back pain    Diabetes mellitus without complication (HCC)    GERD (gastroesophageal reflux disease)    Heart murmur    as a child   Hepatitis C    genotype 1b, 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 s/p 12 week course of simeprevir and sofosbuvir between October 2014 and January 2015 with resolution.   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  No murmurs, rubs, or gallops Pulmonary : Equal breath sounds, No wheezes, rales, or rhonchi Abdominal: soft, nontender,  bowel sounds present Ext: No edema in lower extremities, no tenderness to palpation of lower extremities.  Ankle without swelling today. No pain or tenderness and ROM is intact.   Assessment & Plan:   See Encounters Tab for problem based charting.  Will plan to check lipid profile. If it is not well controlled, she may benefit from the addition of zetia as well. Will plan to continue current statin dose for now as well.  Likely venous insufficiency. Low concern for DVT given intermittent and chronic nature of this swelling. Will plan to continue stockings and elevation.  BP initially mildly elevated with some improvement upon recheck. Given that her home BP readings are in the 120's, would not like to make any changes to her regimen at this time.  Continue amlodipine 10  and metop 50.   Rechecked with value 6.4. Unlikely to receive much benefit from medication at this time. Would advise continued diet and lifestyle control.   Patient discussed with Dr.  Saverio Danker

## 2022-02-18 NOTE — Patient Instructions (Addendum)
Dear Mrs. [name] [name],  Thank you for trusting [place] with your care today. We discussed your cholesterol, blood pressure, ankle swelling, A1c, and the pneumonia shot.   We will check your cholesterol levels today. If necessary, we may add on a medication called Zetia. Your blood pressure is looking good today. I do not think we need to make any changes. For your ankle swelling, I recommend that you continue to use the compression stocking and elevate your leg as necessary. We will check your A1c. You are not due for the pneumonia shot. We will see you back in 3 months for a check up.

## 2022-02-18 NOTE — Assessment & Plan Note (Signed)
Patient currently taking 1/2tablet of her statin. She has not been able to tolerate full dose due to reported dizzines, lightheadedness. Has been compliant with statin. She is asking about injectable medicine for cholesterol.  Will plan to check lipid profile. If it is not well controlled, she may benefit from the addition of zetia as well. Will plan to continue current statin dose for now as well.

## 2022-02-18 NOTE — Assessment & Plan Note (Signed)
Patient takes amlodipine and metoprolol. Reports recent increase inmetop. She has been compliant with medicines. Says that she checks BP at home and that it is typically 120s No complaints.  BP initially mildly elevated with some improvement upon recheck. Given that her home BP readings are in the 120's, would not like to make any changes to her regimen at this time.  Continue amlodipine 10 and metop 50.

## 2022-02-18 NOTE — Assessment & Plan Note (Signed)
Asking about PNA shot. She is not yet due to repeat shot. She reports getting flu shot recently.

## 2022-02-19 LAB — LIPID PANEL
Chol/HDL Ratio: 3.7 ratio (ref 0.0–4.4)
Cholesterol, Total: 149 mg/dL (ref 100–199)
HDL: 40 mg/dL (ref >39)
LDL Chol Calc (NIH): 75 mg/dL (ref 0–99)
Triglycerides: 200 mg/dL — ABNORMAL HIGH (ref 0–149)
VLDL Cholesterol Cal: 34 mg/dL (ref 5–40)

## 2022-02-23 ENCOUNTER — Other Ambulatory Visit: Payer: Self-pay | Admitting: Student

## 2022-02-23 ENCOUNTER — Other Ambulatory Visit: Payer: Self-pay | Admitting: Allergy and Immunology

## 2022-02-23 DIAGNOSIS — L299 Pruritus, unspecified: Secondary | ICD-10-CM

## 2022-02-23 DIAGNOSIS — I10 Essential (primary) hypertension: Secondary | ICD-10-CM

## 2022-02-24 DIAGNOSIS — H2513 Age-related nuclear cataract, bilateral: Secondary | ICD-10-CM | POA: Diagnosis not present

## 2022-02-24 DIAGNOSIS — E119 Type 2 diabetes mellitus without complications: Secondary | ICD-10-CM | POA: Diagnosis not present

## 2022-02-24 DIAGNOSIS — H1045 Other chronic allergic conjunctivitis: Secondary | ICD-10-CM | POA: Diagnosis not present

## 2022-02-24 DIAGNOSIS — H40013 Open angle with borderline findings, low risk, bilateral: Secondary | ICD-10-CM | POA: Diagnosis not present

## 2022-02-24 NOTE — Progress Notes (Signed)
Internal Medicine Clinic Attending  Case discussed with Dr. Elliot Gurney  At the time of the visit.  We reviewed the resident's history and exam and pertinent patient test results.  I agree with the assessment, diagnosis, and plan of care documented in the resident's note. Recommend fasting lipid panel at next office visit to assess need for additional lifestyle modification or pharmacologic therapies to manage elevated triglycerides.

## 2022-03-02 ENCOUNTER — Other Ambulatory Visit: Payer: Self-pay | Admitting: Obstetrics and Gynecology

## 2022-03-02 ENCOUNTER — Encounter: Payer: Self-pay | Admitting: Obstetrics and Gynecology

## 2022-03-02 ENCOUNTER — Telehealth: Payer: Self-pay | Admitting: Physician Assistant

## 2022-03-02 DIAGNOSIS — Z01818 Encounter for other preprocedural examination: Secondary | ICD-10-CM

## 2022-03-02 NOTE — Telephone Encounter (Signed)
Reduce Metoprolol back to 25 mg twice daily. Low Na diet. Monitor blood pressure. Call if BP consistently >/= 140/90. Call if chest symptoms continue after resuming old dose of Metoprolol. Richardson Dopp, PA-C    03/02/2022 6:40 PM

## 2022-03-02 NOTE — Patient Instructions (Signed)
Hi Ms. Sedita-nice to speak with you today-have a great afternoon!  Ms. Ryles was given information about Medicaid Managed Care team care coordination services as a part of their [place] Medicaid benefit. [name] verbally consented to engagement with the [facility] Managed Care team.   If you are experiencing a medical emergency, please call 911 or report to your local emergency department or urgent care.   If you have a non-emergency medical problem during routine business hours, please contact your provider's office and ask to speak with a nurse.   For questions related to your [facility], please call: [phone] or visit the homepage here: [email or web address]  If you would like to schedule transportation through your [facility], please call the following number at least 2 days in advance of your appointment: [phone]   Rides for urgent appointments can also be made after hours by calling Member Services.  Call the [place] at [phone], at any time, 24 hours a day, 7 days a week. If you are in danger or need immediate medical attention call 911.  If you would like help to quit smoking, call 1-800-QUIT-NOW (540) 787-5435) OR Espaol: 1-855-Djelo-Ya (0-177-939-0300) o para ms informacin haga clic aqu or Text READY to 200-400 to register via text  Ms. Polansky - following are the goals we discussed in your visit today:   Goals Addressed             This Visit's Progress    Protect My Health       Timeframe:  Long-Range Goal Priority:  Medium Start Date:      05/07/20                       Expected End Date: ongoing             Follow Up Date: 04/04/22 - schedule appointment for vaccines needed due to my age or health - schedule recommended health tests (blood work, mammogram, colonoscopy, pap test) - schedule and  keep appointment for annual check-up    Why is this important?   Screening tests can find diseases early when they are easier to treat.  Your doctor or nurse will talk with you about which tests are important for you.  Getting shots for common diseases like the flu and shingles will help prevent them.    03/02/22:  patient has f/u appt with PCP 11/17 and appt with Dr. Neldon Mc 11/21   Patient verbalizes understanding of instructions and care plan provided today and agrees to view in [place]. Active MyChart status and patient understanding of how to access instructions and care plan via MyChart confirmed with patient.     The Managed Medicaid care management team will reach out to the patient again over the next 30 business  days.  The  Patient  has been provided with contact information for the Managed Medicaid care management team and has been advised to call with any health related questions or concerns.   Aida Raider RN, BSN [place] Management Coordinator - Managed Medicaid High Risk [phone]   Following is a copy of your plan of care:  Care Plan : General Plan of Care (Adult)  Updates made by Gayla Medicus, RN since 03/02/2022 12:00 AM     Problem: Health Promotion or Disease Self-Management (General Plan of Care)   Priority: Medium  Onset Date: 08/10/2020  Long-Range Goal: Self-Management Plan Developed   Start Date: 05/07/2020  Expected End Date: 05/03/2022  Recent Progress: On track  Priority: Medium  Note:   Current Barriers:  Chronic Disease Management support and education needs.  03/02/22:  patient with continued left shoulder pain since surgery in May-rates pain 7-12 on a 0-10 scale-taking Relafen and helps for the first hour.  BP and BG stable.  Has follow up appts with PCP and allergist this month.  Patient has questions about medications-Relafen, possibly changing to Dovato and BP medication-will refer to Pharmacy   Nurse Case  Manager Clinical Goal(s):  Over the next 30 days, patient will attend all scheduled medical appointments  Interventions:  Inter-disciplinary care team collaboration (see longitudinal plan of care) Evaluation of current treatment plan and patient's adherence to plan as established by provider. Reviewed medications with patient. Collaborated with pharmacy regarding medications. Discussed plans with patient for ongoing care management follow up and provided patient with direct contact information for care management team Reviewed scheduled/upcoming provider appointments. Pharmacy referral for medication review Patient provided with [facility] transportation information.  Patient Goals/Self-Care Activities Over the next 30 days, patient will:  -Attends all scheduled provider appointments Calls pharmacy for medication refills Calls provider office for new concerns or questions  Follow Up Plan: The Managed Medicaid care management team will reach out to the patient again over the next 30 business  days.  The patient has been provided with contact information for the Managed Medicaid care management team and has been advised to call with any health related questions or concerns.

## 2022-03-02 NOTE — Patient Outreach (Signed)
Medicaid Managed Care   Nurse Care Manager Note  03/02/2022 Name:  [name] [name] MRN:  [id number] DOB:  15-Jan-1961  [name] [name] is an 61 y.o. year old female who is a primary patient of [name], MD.  The [facility] Managed Care Coordination team was consulted for assistance with:    Chronic healthcare management needs, DM, HTN, asthma, GERD, anxiety, osteoarthritis, HLD, HIV  Ms. Rezek was given information about Medicaid Managed Care Coordination team services today. [name] [name] Patient agreed to services and verbal consent obtained.  Engaged with patient by telephone for follow up visit in response to provider referral for case management and/or care coordination services.   Assessments/Interventions:  Review of past medical history, allergies, medications, health status, including review of consultants reports, laboratory and other test data, was performed as part of comprehensive evaluation and provision of chronic care management services.  SDOH (Social Determinants of Health) assessments and interventions performed: SDOH Interventions    Flowsheet Row Patient Outreach Telephone from 03/02/2022 in [place] Patient Outreach Telephone from 01/31/2022 in [place] Patient Outreach Telephone from 12/28/2021 in [place] Patient Outreach Telephone from 11/18/2021 in [place] Patient Outreach Telephone from 11/09/2021 in [place] Patient Outreach Telephone from 10/29/2021 in [place] Interventions        Food Insecurity Interventions -- -- Intervention Not Indicated -- -- --  Housing Interventions Intervention Not Indicated -- -- -- -- Intervention Not Indicated  Transportation Interventions -- -- -- -- --  [UHC transportation] --   Utilities Interventions -- -- Intervention Not Indicated -- -- --  Financial Strain Interventions -- Intervention Not Indicated -- -- -- --  Physical Activity Interventions -- Intervention Not Indicated -- -- -- --  Stress Interventions -- -- -- Intervention Not Indicated -- --  Social Connections Interventions Intervention Not Indicated -- -- -- -- --     Care Plan  Allergies  Allergen Reactions   Acetaminophen Other (See Comments)    Inflamed liver, hospitalized     Morphine Sulfate Hives and Shortness Of Breath   Triamterene Hives   Aspirin-Caffeine Other (See Comments)    liver damage; upset stomach   Dyazide [Hydrochlorothiazide W-Triamterene] Hives   Jardiance [Empagliflozin] Diarrhea    (Jardiance) stomach ache    Lisinopril     Cough    Metformin Hcl Er Other (See Comments)     upset stomach    Topamax [Topiramate] Other (See Comments)    Vision disturbances.   Citalopram Itching and Rash   Emtricitabine-Tenofovir Df Rash     Descovy   Losartan Nausea Only and Rash    Pt had rash, worsening dizziness, and nausea after starting losartan, which improved after stopping losartan   Triamterene-Hctz Rash   Medications Reviewed Today     Reviewed by Gayla Medicus, RN (Registered Nurse) on 03/02/22 at [place] List Status: <None>   Medication Order Taking? Sig Documenting Provider Last Dose Status Informant  amLODipine (NORVASC) 10 MG tablet [id number]  TAKE ONE TABLET BY MOUTH DAILY [name], MD  Active   atorvastatin (LIPITOR) 10 MG tablet [id number] No TAKE HALF TABLET BY MOUTH DAILY Katsadouros, Vasilios, MD Taking Active Self  bictegravir-emtricitabine-tenofovir AF (BIKTARVY) 50-200-25 MG TABS tablet [id number] No Take 1 tablet by mouth daily. Nilwood Callas, NP Taking Active Self  buPROPion [facility]  XL) 300 MG 24 hr tablet [id number] No TAKE ONE TABLET BY MOUTH DAILY [name], MD Taking Active Self  cetirizine (ZYRTEC) 10 MG tablet  [id number] No Take 2 tablets (20 mg total) by mouth 2 (two) times daily as needed for allergies. Kozlow, Donnamarie Poag, MD Taking Active   chlorhexidine (PERIDEX) 0.12 % solution [id number] No USE AS DIRECTED 15 MLS IN THE MOUTH OR THROAT TWICE A DAY Dixon, Melton Krebs, NP Taking Active   cholecalciferol (VITAMIN D) 25 MCG (1000 UNIT) tablet [id number] No Take 1 tablet (1,000 Units total) by mouth daily. [name], MD Taking Active   ciclopirox [facility]) 8 % solution [id number] No Apply topically at bedtime. Apply over nail and surrounding skin. Apply daily over previous coat. After seven (7) days, may remove with alcohol and continue cycle. Trula Slade, DPM Taking Active   cyclobenzaprine (FLEXERIL) 10 MG tablet [id number] No Take 1 tablet (10 mg total) by mouth 3 (three) times daily as needed for muscle spasms. Magnant, Gerrianne Scale, PA-C Taking Active Self  diphenoxylate-atropine (LOMOTIL) 2.5-0.025 MG tablet [id number] No Take 1 tablet by mouth 4 (four) times daily as needed for diarrhea or loose stools. [place] Callas, NP Taking Active Self  famotidine (PEPCID) 40 MG tablet [id number] No Take 1 tablet (40 mg total) by mouth at bedtime. Kozlow, Donnamarie Poag, MD Taking Active   gabapentin (NEURONTIN) 300 MG capsule [id number] No Take 1 capsule (300 mg total) by mouth 2 (two) times daily. Meredith Pel, MD Taking Active   hydrocortisone (CORTEF) 5 MG tablet [id number] No Take 2.5-5 mg by mouth See admin instructions. Take 1 tablet (5 mg) by mouth in the morning (scheduled) & may taken an additional 0.5 tablet (2.5 mg) by mouth in the evening if needed. [provider] Taking Active Self  liraglutide (VICTOZA) 18 MG/3ML SOPN [id number] No Inject 1.2 mg into the skin every evening. [provider] Taking Active Self  methocarbamol (ROBAXIN) 500 MG tablet [id number] No Take 1 tablet (500 mg total) by mouth every 8 (eight) hours as needed for muscle spasms. Meredith Pel, MD Taking  Active   metoprolol tartrate (LOPRESSOR) 50 MG tablet [id number]  Take 1 tablet (50 mg total) by mouth 2 (two) times daily. Kathlen Mody, Scott T, PA-C  Active   montelukast (SINGULAIR) 10 MG tablet [id number] No Take 1 tablet (10 mg total) by mouth at bedtime. Kozlow, Donnamarie Poag, MD Taking Active   nabumetone (RELAFEN) 500 MG tablet [id number] No Take 1 tablet (500 mg total) by mouth 2 (two) times daily as needed. Meredith Pel, MD Taking Active   NEURONTIN 100 MG capsule [id number]  TAKE TWO CAPSULES BY MOUTH THREE TIMES A DAY Kozlow, Donnamarie Poag, MD  Active   nitroGLYCERIN (NITROSTAT) 0.4 MG SL tablet [id number] No Place 1 tablet (0.4 mg total) under the tongue every 5 (five) minutes as needed for chest pain. Isaiah Serge, NP Taking Active Self  nortriptyline (PAMELOR) 10 MG capsule [id number] No Take 2 capsules (20 mg total) by mouth at bedtime. Marcial Pacas, MD Taking Active Self  pantoprazole (PROTONIX) 40 MG tablet [id number] No Take 1 tablet (40 mg total) by mouth 2 (two) times daily. Kozlow, Donnamarie Poag, MD Taking Active   potassium chloride SA (KLOR-CON) 20 MEQ tablet [id number] No Take 20 mEq by mouth in the morning. [provider] Taking Active Self  RESTASIS 0.05 % ophthalmic emulsion [id number] No Place 1 drop into both eyes 2 (two) times daily. [provider] Taking Active Self  sodium chloride (OCEAN) 0.65 % SOLN nasal spray [id number] No PLACE 1 SPRAY INTO BOTH NOSTRILS AS NEEDED FOR CONGESTION. [name], DO Taking Active Self  SUMAtriptan (IMITREX) 25 MG tablet [id number] No Take 1 tablet (25 mg total) by mouth every 2 (two) hours as needed for migraine. May repeat in 2 hours if headache persists or recurs. Marcial Pacas, MD Taking Active Self  SYMBICORT 160-4.5 MCG/ACT inhaler [id number] No Inhale 2 puffs into the lungs in the morning and at bedtime. Kozlow, Donnamarie Poag, MD Taking Active   triamcinolone (NASACORT) 55 MCG/ACT AERO nasal inhaler [id number] No Place 1 spray into the  nose 2 (two) times daily. 1 spray each nostril 2 times per day Jiles Prows, MD Taking Active   VENTOLIN HFA 108 (90 Base) MCG/ACT inhaler [id number]  INHALE TWO PUFFS BY MOUTH INTO THE LUNGS EVERY 4 HOURS AS NEEDED FOR WHEEZING OR SHORTNESS OF BREATH Jiles Prows, MD  Active            Patient Active Problem List   Diagnosis Date Noted   Ankle swelling 02/18/2022   Prediabetes 02/18/2022   Synovitis of left shoulder    Complete tear of left rotator cuff    Impingement syndrome of left shoulder 07/01/2021   Grief reaction 06/08/2020   Dizziness on standing 12/27/2019   Generalized anxiety disorder with panic attacks 06/20/2019   Osteoarthritis of thumb, right 11/29/2018   Chronic migraine w/o aura w/o status migrainosus, not intractable 08/27/2018   Disorder of left eustachian tube 05/19/2017   Vertigo of central origin 01/23/2017   Eustachian tube dysfunction, bilateral 06/15/2016   Ear pain, right 06/15/2016   Spondylosis of lumbar region without myelopathy or radiculopathy 10/08/2015   BPPV (benign paroxysmal positional vertigo) 10/02/2015   Liver fibrosis ([place]) 12/04/2014   Pituitary microadenoma ([place]) 08/08/2014   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   Vitamin D deficiency 06/29/2014   Secondary adrenal insufficiency ([place]) 06/29/2014   History of tympanostomy tube placement 02/07/2014   Refusal of blood transfusions as patient is Jehovah's Witness 11/18/2013   Hepatitis C virus infection cured after antiviral drug therapy 03/16/2012   Health care maintenance 12/15/2011   Precordial pain 05/31/2011   Opioid dependence ([place]) 10/05/2010   HTN (hypertension) 10/05/2010   Hyperlipidemia associated with type 2 diabetes mellitus ([place]) 10/23/2008   HIV disease ([place]) 05/09/2006   Depression 05/09/2006   Allergic rhinitis 05/09/2006   GERD 05/09/2006   Conditions to be addressed/monitored per PCP order:  Chronic healthcare management needs, DM, HTN, asthma,  GERD, anxiety, osteoarthritis, HLD, HIV  Care Plan : General Plan of Care (Adult)  Updates made by Gayla Medicus, RN since 03/02/2022 12:00 AM     Problem: Health Promotion or Disease Self-Management (General Plan of Care)   Priority: Medium  Onset Date: 08/10/2020     Long-Range Goal: Self-Management Plan Developed   Start Date: 05/07/2020  Expected End Date: 05/03/2022  Recent Progress: On track  Priority: Medium  Note:   Current Barriers:  Chronic Disease Management support and education needs.  03/02/22:  patient with continued left shoulder pain since surgery in May-rates pain 7-12 on a 0-10 scale-taking Relafen and helps for the first hour.  BP and BG stable.  Has follow up appts with PCP and allergist this month.  Patient has questions about medications-Relafen, possibly changing to Dovato and BP medication-will refer to Pharmacy   Nurse Case Manager Clinical Goal(s):  Over the next  30 days, patient will attend all scheduled medical appointments  Interventions:  Inter-disciplinary care team collaboration (see longitudinal plan of care) Evaluation of current treatment plan and patient's adherence to plan as established by provider. Reviewed medications with patient. Collaborated with pharmacy regarding medications. Discussed plans with patient for ongoing care management follow up and provided patient with direct contact information for care management team Reviewed scheduled/upcoming provider appointments. Pharmacy referral for medication review Patient provided with [facility] transportation information.  Patient Goals/Self-Care Activities Over the next 30 days, patient will:  -Attends all scheduled provider appointments Calls pharmacy for medication refills Calls provider office for new concerns or questions  Follow Up Plan: The Managed Medicaid care management team will reach out to the patient again over the next 30 business  days.  The patient has been provided with contact  information for the Managed Medicaid care management team and has been advised to call with any health related questions or concerns.    Follow Up:  Patient agrees to Care Plan and Follow-up.  Plan: The Managed Medicaid care management team will reach out to the patient again over the next 30 business  days. and The  Patient has been provided with contact information for the Managed Medicaid care management team and has been advised to call with any health related questions or concerns.  Date/time of next scheduled RN care management/care coordination outreach:  04/04/22 at 1030

## 2022-03-02 NOTE — Telephone Encounter (Signed)
Pt c/o medication issue:  1. Name of Medication: Metoprolol 50 mg  2. How are you currently taking this medication (dosage and times per day)? 2 tablet 2 times a day  3. Are you having a reaction (difficulty breathing--STAT)?   4. What is your medication issue? Making her chest feel like an aching in it, more dizzier than before

## 2022-03-02 NOTE — Telephone Encounter (Signed)
Pt called back per note below.    Pt states, since her Metoprolol was increased to 50 mg Bid, she feels more dizzy, and at times short of breath.    Pt states she felt better taking 25 mg Metoprolol twice a day.    Pt did not want to come into [facility] on [address] to see a provider for reassessment this week, but wanted me to reach out to Mr. Kathlen Mody, [place].    Pt wants to be called back after Mr. Jorene Minors is consulted.

## 2022-03-03 MED ORDER — METOPROLOL TARTRATE 50 MG PO TABS: 25.0000 mg | ORAL_TABLET | Freq: Two times a day (BID) | ORAL | 3 refills | Status: DC

## 2022-03-03 NOTE — Telephone Encounter (Signed)
Pt contacted, per Mr. Richardson Dopp, PA-C note / orders below.    Pt advised to:  Reduce Metoprolol to 25 mg / Half tablet, by mouth twice daily.  Educated on Low Na Diet, and how will help with BP management.    Pt told Monitor BP, >/= to 140 /90 call [facility].  Pt educated / advised to also call [facility] if her chest symptoms sustain or continue after decreasing her Metoprolol.  Pt understood advisement.    Pt also stated she is taking half tablet of Lipitor, but has upcoming appointment with provider to discuss this medication.    Pt understood all orders in new POC.   She will reach out if needed.

## 2022-03-09 ENCOUNTER — Telehealth: Payer: Self-pay

## 2022-03-09 NOTE — Progress Notes (Signed)
   Care Guide Note  03/09/2022 Name: [name] [name] MRN: [id number] DOB: [birth year]  Referred by: [name], MD Reason for referral : Care Coordination (Outreach to schedule with Pharm D )   [name] [name] is a 61 y.o. year old female who is a primary care patient of [name], MD. [name] [name] was referred to the pharmacist for assistance related to DM.    Successful contact was made with the patient to discuss pharmacy services including being ready for the pharmacist to call at least 5 minutes before the scheduled appointment time, to have medication bottles and any blood sugar or blood pressure readings ready for review. The patient agreed to meet with the pharmacist via with the pharmacist via telephone visit on 03/24/2022.    Noreene Larsson, [place], [place] [address] Direct Dial: [phone] [email or web address]

## 2022-03-11 ENCOUNTER — Other Ambulatory Visit: Payer: Self-pay | Admitting: Allergy and Immunology

## 2022-03-11 ENCOUNTER — Other Ambulatory Visit: Payer: Self-pay | Admitting: Podiatry

## 2022-03-11 ENCOUNTER — Ambulatory Visit: Payer: Medicaid Other | Admitting: Student

## 2022-03-11 ENCOUNTER — Other Ambulatory Visit: Payer: Self-pay | Admitting: Infectious Diseases

## 2022-03-11 VITALS — BP 153/86 | HR 92 | Temp 97.8°F | Wt 178.4 lb

## 2022-03-11 DIAGNOSIS — F410 Panic disorder [episodic paroxysmal anxiety] without agoraphobia: Secondary | ICD-10-CM | POA: Diagnosis not present

## 2022-03-11 DIAGNOSIS — F411 Generalized anxiety disorder: Secondary | ICD-10-CM

## 2022-03-11 DIAGNOSIS — M810 Age-related osteoporosis without current pathological fracture: Secondary | ICD-10-CM

## 2022-03-11 DIAGNOSIS — R682 Dry mouth, unspecified: Secondary | ICD-10-CM

## 2022-03-11 DIAGNOSIS — F419 Anxiety disorder, unspecified: Secondary | ICD-10-CM

## 2022-03-11 DIAGNOSIS — I10 Essential (primary) hypertension: Secondary | ICD-10-CM

## 2022-03-11 MED ORDER — HYDROCHLOROTHIAZIDE 12.5 MG PO CAPS: 12.5000 mg | ORAL_CAPSULE | Freq: Every day | ORAL | 0 refills | Status: DC

## 2022-03-11 MED ORDER — SERTRALINE HCL 25 MG PO TABS: 25.0000 mg | ORAL_TABLET | Freq: Every day | ORAL | 0 refills | Status: DC

## 2022-03-11 NOTE — Patient Instructions (Addendum)
Ms.Vegas L Brokaw, it was a pleasure seeing you today!  Today we discussed: - blood pressure: Please start hydrochlorothiazide 12.'5mg'$  once daily along with your other medications.  - Anxiety: I have started you on sertraline '25mg'$  daily.   - I have ordered another bone density scan.  I have ordered the following medication/changed the following medications:   Start the following medications: Meds ordered this encounter  Medications   sertraline (ZOLOFT) 25 MG tablet    Sig: Take 1 tablet (25 mg total) by mouth daily.    Dispense:  30 tablet    Refill:  0   hydrochlorothiazide (MICROZIDE) 12.5 MG capsule    Sig: Take 1 capsule (12.5 mg total) by mouth daily.    Dispense:  30 capsule    Refill:  0     Follow-up: 3 months   Please make sure to arrive 15 minutes prior to your next appointment. If you arrive late, you may be asked to reschedule.   We look forward to seeing you next time. Please call our clinic at [phone] if you have any questions or concerns. The best time to call is Monday-Friday from 9am-4pm, but there is someone available 24/7. If after hours or the weekend, call the main hospital number and ask for the Internal Medicine Resident On-Call. If you need medication refills, please notify your pharmacy one week in advance and they will send [place] a request.  Thank you for letting us take part in your care. Wishing you the best!  Thank you, [name], MD

## 2022-03-14 NOTE — Telephone Encounter (Signed)
Okay to refill? 

## 2022-03-14 NOTE — Progress Notes (Unsigned)
   CC: blood pressure, anxiety  HPI:  [name] is a 61 y.o. person with medical history as below presenting to [facility] for blood pressure, anxiety.  Please see problem-based list for further details, assessments, and plans.  Past Medical History:  Diagnosis Date   Allergic rhinitis 05/09/2006   Allergy    Anxiety    Arthritis    Asthma    CHF (congestive heart failure) (HCC)    Chronic back pain    Diabetes mellitus without complication (HCC)    GERD (gastroesophageal reflux disease)    Heart murmur    as a child   Hepatitis C    genotype 1b, 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 s/p 12 week course of simeprevir and sofosbuvir between October 2014 and January 2015 with resolution.   History of shingles    HIV infection ([place])    1994   Hyperlipidemia    no meds taken now   Hypertension    Migraine    Pituitary microadenoma ([place]) 08/08/2014   Pneumonia    Prediabetes    Refusal of blood transfusions as patient is Jehovah's Witness    Secondary adrenal insufficiency ([place]) 06/29/2014   Urticaria    Review of Systems:  As per HPI  Physical Exam:  Vitals:   03/11/22 1029  BP: (!) 153/86  Pulse: 92  Temp: 97.8 F (36.6 C)  TempSrc: Oral  SpO2: 97%  Weight: 178 lb 6.4 oz (80.9 kg)   General: Resting comfortably in no acute distress HENT: *** Eyes: *** CV: *** Pulm: *** GI: *** MSK: *** Skin: *** Neuro: *** Psych: ***  Assessment & Plan:   No problem-specific Assessment & Plan notes found for this encounter.   Patient {GC/GE:3044014::"discussed with","seen with"} Dr. {NAMES:[id number]::"Butcher","Guilloud","Hoffman","Mullen","Narendra","Raines","Vincent"}  [name], MD Internal Medicine PGY-3 Pager: [phone]

## 2022-03-15 ENCOUNTER — Ambulatory Visit (INDEPENDENT_AMBULATORY_CARE_PROVIDER_SITE_OTHER): Payer: Medicaid Other | Admitting: Allergy and Immunology

## 2022-03-15 VITALS — BP 142/64 | HR 83 | Resp 20

## 2022-03-15 DIAGNOSIS — L299 Pruritus, unspecified: Secondary | ICD-10-CM

## 2022-03-15 DIAGNOSIS — J3089 Other allergic rhinitis: Secondary | ICD-10-CM | POA: Diagnosis not present

## 2022-03-15 DIAGNOSIS — J4540 Moderate persistent asthma, uncomplicated: Secondary | ICD-10-CM

## 2022-03-15 DIAGNOSIS — M8580 Other specified disorders of bone density and structure, unspecified site: Secondary | ICD-10-CM | POA: Insufficient documentation

## 2022-03-15 DIAGNOSIS — M810 Age-related osteoporosis without current pathological fracture: Secondary | ICD-10-CM | POA: Insufficient documentation

## 2022-03-15 DIAGNOSIS — K219 Gastro-esophageal reflux disease without esophagitis: Secondary | ICD-10-CM

## 2022-03-15 DIAGNOSIS — F419 Anxiety disorder, unspecified: Secondary | ICD-10-CM | POA: Insufficient documentation

## 2022-03-15 MED ORDER — FAMOTIDINE 40 MG PO TABS: 40.0000 mg | ORAL_TABLET | Freq: Every evening | ORAL | 5 refills | Status: DC

## 2022-03-15 MED ORDER — PANTOPRAZOLE SODIUM 40 MG PO TBEC: 40.0000 mg | DELAYED_RELEASE_TABLET | Freq: Two times a day (BID) | ORAL | 1 refills | Status: DC

## 2022-03-15 MED ORDER — CETIRIZINE HCL 10 MG PO TABS: ORAL_TABLET | ORAL | 5 refills | Status: DC

## 2022-03-15 MED ORDER — MONTELUKAST SODIUM 10 MG PO TABS: 10.0000 mg | ORAL_TABLET | Freq: Every evening | ORAL | 5 refills | Status: DC

## 2022-03-15 MED ORDER — SYMBICORT 160-4.5 MCG/ACT IN AERO: INHALATION_SPRAY | RESPIRATORY_TRACT | 5 refills | Status: DC

## 2022-03-15 MED ORDER — ALBUTEROL SULFATE HFA 108 (90 BASE) MCG/ACT IN AERS: 2.0000 | INHALATION_SPRAY | RESPIRATORY_TRACT | 1 refills | Status: DC | PRN

## 2022-03-15 MED ORDER — GABAPENTIN 100 MG PO CAPS: ORAL_CAPSULE | ORAL | 3 refills | Status: DC

## 2022-03-15 NOTE — Assessment & Plan Note (Signed)
BP Readings from Last 3 Encounters:  03/11/22 (!) 153/86  02/18/22 134/74  02/14/22 (!) 140/84   Blood pressure significantly elevated here in the office. Over her last few office visits her pressures have also been elevated. She does regularly take her blood pressure at home, and it has consistently been above goal. In reviewing her list of allergies, triamterene-hydrochlorothiazide previously did give her a rash, as confirmed by Ms. Harbin today. However, she reports she took hydrochlorothiazide prior to that and did not have any issues that she can remember. She has not tolerated ACE or ARBs in the past. Today we will plan to start low dose hydrochlorothiazide. Discussed potential side effects and return precautions were given.   - Start hydrochlorothiazide 12.mg daily - Continue metoprolol '25mg'$  twice daily - Continue amlodipine '10mg'$  daily - Ms. Gavilanes has a follow-up with ID in roughly a month, I have messaged them to see if her renal function and electrolytes could be checked at that time

## 2022-03-15 NOTE — Patient Instructions (Signed)
  1.  Continue to Treat and prevent inflammation:   A.  Montelukast 10 mg - 1 tablet 1 time per day  B.  Symbicort 160 - 2 inhalations 1-2 times per day  C.  Nasacort -1 spray each nostril 1-2 times per day  2.  Continue to Treat and prevent reflux:   A.  Protonix 40 mg 2 times per day  B.  Famotidine 40 mg in p.m.    3.  Continue to treat and prevent itchiness:    A. cetirizine 10 mg -1-2 tablets 1-2 times a day   B. Gabapentin 100 mg - 1-2 tablet 3 times a day  4.  If needed:   A.  OTC Benadryl  B.  OTC nasal saline  C.  Pro Air HFA 2 puffs every 4-6 hours  D.  Nasal saline  5. Return to clinic in 6 months or earlier if problem  6.  Obtain RSV vaccine

## 2022-03-15 NOTE — Progress Notes (Unsigned)
[place]   Follow-up Note  Referring Provider: [name], MD Primary Provider: [name], MD Date of Office Visit: 03/15/2022  Subjective:   [name] DEVOS (DOB: 09-19-1960) is a 61 y.o. female who [facility]22 in re-evaluation of the following:  HPI: Kathia turns to this clinic in evaluation of asthma, allergic rhinitis, LPR, and a pruritic disorder.  Her last visit to this clinic was 09 Sep 2021.  Her airway issue has really been doing very well and while utilizing Symbicort just once a day and a nasal steroid just once a day she has had complete control of her upper and lower airway issue and does not use any short acting bronchodilator has not required a systemic steroid or antibiotic for any type of airway issue.  She has not been having any problems with reflux on her current therapy of her proton pump inhibitor and H2 receptor blocker.  Her pruritic disorder is under very good control while using 200 mg of gabapentin 3 times per day.  She has received flu vaccine and COVID-vaccine.  She is approximately 6 months out from her left shoulder surgery and this appears to be going okay although she is a little bit disappointed that is not 100% at this point in time.  Allergies as of 03/15/2022       Reactions   Acetaminophen Other (See Comments)   Inflamed liver, hospitalized   Morphine Sulfate Hives, Shortness Of Breath   Triamterene Hives   Aspirin-caffeine Other (See Comments)   liver damage; upset stomach   Dyazide [hydrochlorothiazide W-triamterene] Hives   Jardiance [empagliflozin] Diarrhea   (Jardiance) stomach ache   Lisinopril    Cough    Metformin Hcl Er Other (See Comments)    upset stomach   Topamax [topiramate] Other (See Comments)   Vision disturbances.   Citalopram Itching, Rash   Emtricitabine-tenofovir Df Rash    Descovy   Losartan Nausea Only,  Rash   Pt had rash, worsening dizziness, and nausea after starting losartan, which improved after stopping losartan   Triamterene-hctz Rash        Medication List    amLODipine 10 MG tablet Commonly known as: NORVASC TAKE ONE TABLET BY MOUTH DAILY   atorvastatin 10 MG tablet Commonly known as: LIPITOR TAKE HALF TABLET BY MOUTH DAILY   Biktarvy 50-200-25 MG Tabs tablet Generic drug: bictegravir-emtricitabine-tenofovir AF Take 1 tablet by mouth daily.   buPROPion 300 MG 24 hr tablet Commonly known as: WELLBUTRIN XL TAKE ONE TABLET BY MOUTH DAILY   cetirizine 10 MG tablet Commonly known as: ZYRTEC TAKE TWO TABLETS BY MOUTH TWICE A DAY AS NEEDED FOR ALLERGIES   chlorhexidine 0.12 % solution Commonly known as: PERIDEX USE AS DIRECTED 15 MLS IN THE MOUTH OR THROAT TWICE A DAY   cholecalciferol 25 MCG (1000 UNIT) tablet Commonly known as: VITAMIN D3 Take 1 tablet (1,000 Units total) by mouth daily.   ciclopirox 8 % solution Commonly known as: PENLAC APPLY OVER NAIL AND SURROUNDING SKIN AT BEDTIME OVER PREVIOUS COAT. REMOVE AFTER 7DAYS/REPEAT   cyclobenzaprine 10 MG tablet Commonly known as: FLEXERIL Take 1 tablet (10 mg total) by mouth 3 (three) times daily as needed for muscle spasms.   diphenoxylate-atropine 2.5-0.025 MG tablet Commonly known as: Lomotil Take 1 tablet by mouth 4 (four) times daily as needed for diarrhea or loose stools.   famotidine 40 MG tablet Commonly  known as: PEPCID Take 1 tablet (40 mg total) by mouth at bedtime.   hydrochlorothiazide 12.5 MG capsule Commonly known as: MICROZIDE Take 1 capsule (12.5 mg total) by mouth daily.   hydrocortisone 5 MG tablet Commonly known as: CORTEF Take 2.5-5 mg by mouth See admin instructions. Take 1 tablet (5 mg) by mouth in the morning (scheduled) & may taken an additional 0.5 tablet (2.5 mg) by mouth in the evening if needed.   liraglutide 18 MG/3ML Sopn Commonly known as: VICTOZA Inject 1.2 mg into  the skin every evening.   methocarbamol 500 MG tablet Commonly known as: ROBAXIN Take 1 tablet (500 mg total) by mouth every 8 (eight) hours as needed for muscle spasms.   metoprolol tartrate 50 MG tablet Commonly known as: LOPRESSOR Take 0.5 tablets (25 mg total) by mouth 2 (two) times daily.   montelukast 10 MG tablet Commonly known as: SINGULAIR Take 1 tablet (10 mg total) by mouth at bedtime.   nabumetone 500 MG tablet Commonly known as: RELAFEN Take 1 tablet (500 mg total) by mouth 2 (two) times daily as needed.   nitroGLYCERIN 0.4 MG SL tablet Commonly known as: NITROSTAT Place 1 tablet (0.4 mg total) under the tongue every 5 (five) minutes as needed for chest pain.   nortriptyline 10 MG capsule Commonly known as: PAMELOR Take 2 capsules (20 mg total) by mouth at bedtime.   pantoprazole 40 MG tablet Commonly known as: PROTONIX Take 1 tablet (40 mg total) by mouth 2 (two) times daily.   potassium chloride SA 20 MEQ tablet Commonly known as: KLOR-CON M Take 20 mEq by mouth in the morning.   Restasis 0.05 % ophthalmic emulsion Generic drug: cycloSPORINE Place 1 drop into both eyes 2 (two) times daily.   sertraline 25 MG tablet Commonly known as: Zoloft Take 1 tablet (25 mg total) by mouth daily.   sodium chloride 0.65 % Soln nasal spray Commonly known as: OCEAN PLACE 1 SPRAY INTO BOTH NOSTRILS AS NEEDED FOR CONGESTION.   SUMAtriptan 25 MG tablet Commonly known as: Imitrex Take 1 tablet (25 mg total) by mouth every 2 (two) hours as needed for migraine. May repeat in 2 hours if headache persists or recurs.   Symbicort 160-4.5 MCG/ACT inhaler Generic drug: budesonide-formoterol INHALE TWO PUFFS INTO THE LUNGS IN THE MORNING AND AT BEDTIME   triamcinolone 55 MCG/ACT Aero nasal inhaler Commonly known as: NASACORT Place 1 spray into the nose 2 (two) times daily. 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 s/p 12 week course of simeprevir and sofosbuvir between October 2014 and January 2015 with resolution.   History of shingles    HIV infection ([facility])    1994   Hyperlipidemia    no meds taken now   Hypertension  Migraine    Pituitary microadenoma ([facility]) 08/08/2014   Pneumonia    Prediabetes    Refusal of blood transfusions as patient is Jehovah's Witness    Secondary adrenal insufficiency ([facility]) 06/29/2014   Urticaria     Past Surgical History:  Procedure Laterality Date   BUNIONECTOMY     b/l   COLECTOMY     2003 for diverticulitis, had colostomy bag and then reversed   COLONOSCOPY     HAND SURGERY     HAND SURGERY Right    right thumb surgery Dr Burney Gauze 2021   NASAL SINUS SURGERY     SHOULDER ARTHROSCOPY WITH OPEN ROTATOR CUFF REPAIR AND DISTAL CLAVICLE ACROMINECTOMY Left 08/31/2021   Procedure: LEFT SHOULDER ARTHROSCOPY, DEBRIDEMENT,  ROTATOR CUFF TEAR REPAIR;  Surgeon: Meredith Pel, MD;  Location: [facility];  Service: Orthopedics;  Laterality: Left;   SHOULDER SURGERY     left   TONSILLECTOMY      Review of systems negative except as noted in HPI / PMHx or noted below:  Review of Systems  Constitutional: Negative.   HENT: Negative.    Eyes: Negative.   Respiratory: Negative.    Cardiovascular: Negative.   Gastrointestinal: Negative.   Genitourinary: Negative.   Musculoskeletal: Negative.   Skin: Negative.   Neurological: Negative.   Endo/Heme/Allergies: Negative.    Psychiatric/Behavioral: Negative.       Objective:   Vitals:   03/15/22 1056  BP: (!) 142/64  Pulse: 83  Resp: 20  SpO2: 98%          Physical Exam Constitutional:      Appearance: She is not diaphoretic.  HENT:     Head: Normocephalic.     Right Ear: Tympanic membrane, ear canal and external ear normal.     Left Ear: Tympanic membrane, ear canal and external ear normal.     Nose: Nose normal. No mucosal edema or rhinorrhea.     Mouth/Throat:     Pharynx: Uvula midline. No oropharyngeal exudate.  Eyes:     Conjunctiva/sclera: Conjunctivae normal.  Neck:     Thyroid: No thyromegaly.     Trachea: Trachea normal. No tracheal tenderness or tracheal deviation.  Cardiovascular:     Rate and Rhythm: Normal rate and regular rhythm.     Heart sounds: Normal heart sounds, S1 normal and S2 normal. No murmur heard. Pulmonary:     Effort: No respiratory distress.     Breath sounds: Normal breath sounds. No stridor. No wheezing or rales.  Lymphadenopathy:     Head:     Right side of head: No tonsillar adenopathy.     Left side of head: No tonsillar adenopathy.     Cervical: No cervical adenopathy.  Skin:    Findings: No erythema or rash.     Nails: There is no clubbing.  Neurological:     Mental Status: She is alert.     Diagnostics:    Spirometry was performed and demonstrated an FEV1 of 89 at 1.63 % of predicted.  The patient had an Asthma Control Test with the following results:  .    Assessment and Plan:   1. Asthma, moderate persistent, well-controlled   2. Other allergic rhinitis   3. LPRD (laryngopharyngeal reflux disease)   4. Pruritic disorder    1.  Continue to Treat and prevent inflammation:   A.  Montelukast 10 mg - 1 tablet 1 time per day  B.  Symbicort 160 - 2 inhalations 1-2 times  per day  C.  Nasacort -1 spray each nostril 1-2 times per day  2.  Continue to Treat and prevent reflux:   A.  Protonix 40 mg 2 times per day  B.  Famotidine 40 mg  in p.m.    3.  Continue to treat and prevent itchiness:    A. cetirizine 10 mg -1-2 tablets 1-2 times a day   B. Gabapentin 100 mg - 1-2 tablet 3 times a day  4.  If needed:   A.  OTC Benadryl  B.  OTC nasal saline  C.  Pro Air HFA 2 puffs every 4-6 hours  D.  Nasal saline  5. Return to clinic in 6 months or earlier if problem  6.  Obtain RSV vaccine  Anasofia is really doing very well with her airway issue while utilizing anti-inflammatory medications for both her upper and lower airway and her reflux induced respiratory disease is also under very good control while using an aggressive treatment directed against LPR.  And as well, her pruritic disorder is under good control with gabapentin.  I am not really going to change any of her therapy at this point in time.  I will see her back in this clinic in 6 months or earlier if there is a problem.  Allena Katz, MD Allergy / Immunology [place]

## 2022-03-15 NOTE — Assessment & Plan Note (Signed)
Last DEXA scan in 2020 revealed T-score -2.5. FRAX score 8% risk of major fracture. She has been taking vitamin D supplementation. She has increased risk with her previous HIV medications and chronic steroid use. We will plan to repeat DEXA.  - DEXA ordered

## 2022-03-15 NOTE — Assessment & Plan Note (Signed)
Ms. Nulty reports she regularly takes Wellbutrin, which has been working well for her. However, over the last month or so she reports overall feeling more anxious. She denies any known triggers or any specific places that induce the anxiety. She denies chest pain, dyspnea, palpitations. We discussed previous medications she has tried, including Prozac and Paxil. We will trial low dose sertraline.  - Start sertraline '25mg'$  daily - Continue Wellbutrin '300mg'$  daily

## 2022-03-16 ENCOUNTER — Encounter: Payer: Self-pay | Admitting: Allergy and Immunology

## 2022-03-21 NOTE — Progress Notes (Signed)
Internal Medicine Clinic Attending ? ?Case discussed with Dr. Braswell  At the time of the visit.  We reviewed the resident?s history and exam and pertinent patient test results.  I agree with the assessment, diagnosis, and plan of care documented in the resident?s note.  ?

## 2022-03-23 ENCOUNTER — Ambulatory Visit
Admission: RE | Admit: 2022-03-23 | Discharge: 2022-03-23 | Disposition: A | Discharge status: Home / Self Care | Payer: Medicaid Other | Source: Ambulatory Visit | Attending: Internal Medicine | Admitting: Internal Medicine

## 2022-03-23 DIAGNOSIS — M8589 Other specified disorders of bone density and structure, multiple sites: Secondary | ICD-10-CM | POA: Diagnosis not present

## 2022-03-23 DIAGNOSIS — M810 Age-related osteoporosis without current pathological fracture: Secondary | ICD-10-CM

## 2022-03-23 DIAGNOSIS — Z780 Asymptomatic menopausal state: Secondary | ICD-10-CM | POA: Diagnosis not present

## 2022-03-24 ENCOUNTER — Other Ambulatory Visit: Payer: Medicaid Other | Admitting: Pharmacist

## 2022-03-24 ENCOUNTER — Encounter: Payer: Self-pay | Admitting: Physical Medicine & Rehabilitation

## 2022-03-24 ENCOUNTER — Encounter: Payer: Medicaid Other | Attending: Physical Medicine & Rehabilitation | Admitting: Physical Medicine & Rehabilitation

## 2022-03-24 VITALS — BP 136/84 | HR 83 | Temp 98.9°F | Ht 62.0 in | Wt 173.0 lb

## 2022-03-24 DIAGNOSIS — M461 Sacroiliitis, not elsewhere classified: Secondary | ICD-10-CM | POA: Diagnosis not present

## 2022-03-24 MED ORDER — DEXAMETHASONE SODIUM PHOSPHATE 10 MG/ML IJ SOLN: 10.0000 mg | Freq: Once | INTRAMUSCULAR | Status: DC

## 2022-03-24 MED ORDER — IOHEXOL 180 MG/ML  SOLN
3.0000 mL | Freq: Once | INTRAMUSCULAR | Status: AC
  Administered 2022-03-24: 3 mL via INTRAVENOUS

## 2022-03-24 MED ORDER — LIDOCAINE HCL (PF) 1 % IJ SOLN: 2.0000 mL | Freq: Once | INTRAMUSCULAR | Status: DC

## 2022-03-24 MED ORDER — LIDOCAINE HCL 1 % IJ SOLN
5.0000 mL | Freq: Once | INTRAMUSCULAR | Status: AC
  Administered 2022-03-24: 5 mL

## 2022-03-24 NOTE — Addendum Note (Signed)
Addended by: Franchot Gallo on: 03/24/2022 04:24 PM   Modules accepted: Orders

## 2022-03-24 NOTE — Progress Notes (Signed)
03/24/2022 Name: [name] [name] MRN: [id number] DOB: 07/21/1960  Chief Complaint  Patient presents with   Medication Management    [name] [name] is a 61 y.o. year old female who presented for a telephone visit.   They were referred to the pharmacist by their Case Management Team  for assistance in managing complex medication management.   Patient is participating in a Managed Medicaid Plan:  Yes  Subjective:  Care Team: Primary Care Provider: [name], MD ; Next Scheduled Visit: not scheduled [facility]: 03/31/22 for Dovato study, Dixon on 08/23/22 for follow up PM&R: Kirsteins; 06/27/21 Allergist: Kozlow; 09/13/22  Medication Access/Adherence  Current Pharmacy:  [facility], [place] [address] Phone: [phone] Fax: [phone]   Patient reports affordability concerns with their medications: No  Patient reports access/transportation concerns to their pharmacy: No  Patient reports adherence concerns with their medications:  No    Patient's greatest concern today is chronic, sharp, radiating pain in her arm after her shoulder surgery. She reports that nothing she has previously tried has been beneficial. She reports she does not do well with opioids and does not want them  Pain: - Describes both back pain - received injection today from Dr. Joan Mayans; and radiating sharp shoulder pain  Current medications: - Nortriptyline 20 mg QPM - cluster headache prevention; reports this is effective and she has not needed sumatriptan lately; gabapentin 100 mg 3 times daily for itching - however, reports she previously had a higher dose after surgery with no benefit  - Denies benefit from methocarbamol and cyclobenzaprine, so has not been using. Confirms she tried consistently for a few weeks - Denies benefit from nabumetone or from prior trial of naproxen. Wonders about celecoxib  Anxiety: Current  medications: sertraline 25 mg daily, bupropion XL 300 mg daily, possible impact of nortriptyline as well  Reports she has had significant improvement in her mood sing starting sertraline  Diabetes:  Current medications: Victoza 1.2 mg daily  Hypertension:  Current medications: amlodipine 10 mg daily, HCTZ 12.5 mg daily, metoprolol 25 mg twice daily  HIV:  Current medications: Biktarvy daily, but reports she is about to engage in a study to switch to Dovato   Hyperlipidemia/ASCVD Risk Reduction  Current lipid lowering medications: atorvastatin 5 mg daily (1/2 of 10 mg daily)  Asthma:  Current medications: Symbicort 160/4.5 mcg 2 puffs twice daily, nasacort nasal spray daily, montelukast 10 mg daily, cetirizine 10 mg daily, additional benefit of famotidine 40 mg daily  GERD: Current medications: pantoprazole 40 mg twice daily, famotidine 40 mg daily  Health Maintenance  Health Maintenance Due  Topic Date Due   PAP SMEAR-Modifier  01/11/2021   COVID-19 Vaccine (5 - 2023-24 season) 12/24/2021   FOOT EXAM  03/08/2022     Objective: Lab Results  Component Value Date   HGBA1C 6.4 (A) 02/18/2022    Lab Results  Component Value Date   CREATININE 0.90 02/08/2022   BUN 11 02/08/2022   NA 141 02/08/2022   K 3.8 02/08/2022   CL 103 02/08/2022   CO2 27 02/08/2022    Lab Results  Component Value Date   CHOL 149 02/18/2022   HDL 40 02/18/2022   LDLCALC 75 02/18/2022   TRIG 200 (H) 02/18/2022   CHOLHDL 3.7 02/18/2022    Medications Reviewed Today     Reviewed by Charlett Blake, MD (Physician) on 03/24/22 at 1344  Med List Status: <None>   Medication Order Taking? Sig Documenting Provider Last Dose Status Informant  albuterol (VENTOLIN HFA) 108 (90 Base) MCG/ACT inhaler [id number] Yes Inhale 2 puffs into the lungs every 4 (four) hours as needed for wheezing or shortness of breath. Kozlow, Donnamarie Poag, MD Taking Active   amLODipine (NORVASC) 10 MG tablet [id number] Yes  TAKE ONE TABLET BY MOUTH DAILY [name], MD Taking Active   atorvastatin (LIPITOR) 10 MG tablet [id number] Yes TAKE HALF TABLET BY MOUTH DAILY Katsadouros, Vasilios, MD Taking Active Self  bictegravir-emtricitabine-tenofovir AF (BIKTARVY) 50-200-25 MG TABS tablet [id number] Yes Take 1 tablet by mouth daily. [place] Callas, NP Taking Active Self  buPROPion (WELLBUTRIN XL) 300 MG 24 hr tablet [id number] Yes TAKE ONE TABLET BY MOUTH DAILY [name], MD Taking Active Self  cetirizine (ZYRTEC) 10 MG tablet [id number] Yes Take 1-2 tablets 1-2 times a day for itch Kozlow, Donnamarie Poag, MD Taking Active   chlorhexidine (PERIDEX) 0.12 % solution [id number] Yes USE AS DIRECTED 15 MLS IN THE MOUTH OR THROAT TWICE A DAY Dixon, Melton Krebs, NP Taking Active   cholecalciferol (VITAMIN D) 25 MCG (1000 UNIT) tablet [id number] Yes Take 1 tablet (1,000 Units total) by mouth daily. [name], MD Taking Active   ciclopirox [facility]) 8 % solution [id number] Yes APPLY OVER NAIL AND SURROUNDING SKIN AT BEDTIME OVER PREVIOUS COAT. REMOVE AFTER 7DAYS/REPEAT Trula Slade, DPM Taking Active   cyclobenzaprine (FLEXERIL) 10 MG tablet [id number] Yes Take 1 tablet (10 mg total) by mouth 3 (three) times daily as needed for muscle spasms. Magnant, Gerrianne Scale, PA-C Taking Active Self  dexamethasone (DECADRON) injection 10 mg [id number]   Charlett Blake, MD  Active   diphenoxylate-atropine (LOMOTIL) 2.5-0.025 MG tablet [id number] Yes Take 1 tablet by mouth 4 (four) times daily as needed for diarrhea or loose stools. Harrell Callas, NP Taking Active Self  famotidine (PEPCID) 40 MG tablet [id number] Yes Take 1 tablet (40 mg total) by mouth at bedtime. Kozlow, Donnamarie Poag, MD Taking Active   gabapentin (NEURONTIN) 100 MG capsule [id number] Yes Take 1-2 tablets 3 times a day Kozlow, Donnamarie Poag, MD Taking Active   hydrochlorothiazide (MICROZIDE) 12.5 MG capsule [id number] Yes Take 1 capsule (12.5 mg total) by mouth daily.  [name], MD Taking Active   hydrocortisone (CORTEF) 5 MG tablet [id number] Yes Take 2.5-5 mg by mouth See admin instructions. Take 1 tablet (5 mg) by mouth in the morning (scheduled) & may taken an additional 0.5 tablet (2.5 mg) by mouth in the evening if needed. [provider] Taking Active Self  iohexol (OMNIPAQUE) 180 MG/ML injection 3 mL [id number]   Charlett Blake, MD  Active   lidocaine (PF) (XYLOCAINE) 1 % injection 2 mL [id number]   Charlett Blake, MD  Active   lidocaine (XYLOCAINE) 1 % (with pres) injection 5 mL [id number]   Charlett Blake, MD  Active   liraglutide (VICTOZA) 18 MG/3ML SOPN [id number] Yes Inject 1.2 mg into the skin every evening. [provider] Taking Active Self  methocarbamol (ROBAXIN) 500 MG tablet [id number] Yes Take 1 tablet (500 mg total) by mouth every 8 (eight) hours as needed for muscle spasms. Meredith Pel, MD Taking Active   metoprolol tartrate (LOPRESSOR) 50 MG tablet [id number] Yes Take 0.5 tablets (25 mg total) by mouth 2 (two) times daily. Richardson Dopp T, PA-C Taking Active   montelukast (SINGULAIR) 10 MG tablet [id number] Yes Take 1 tablet (10 mg total) by mouth  at bedtime. Kozlow, Donnamarie Poag, MD Taking Active   nabumetone (RELAFEN) 500 MG tablet [id number] Yes Take 1 tablet (500 mg total) by mouth 2 (two) times daily as needed. Meredith Pel, MD Taking Active   nitroGLYCERIN (NITROSTAT) 0.4 MG SL tablet [id number] Yes Place 1 tablet (0.4 mg total) under the tongue every 5 (five) minutes as needed for chest pain. Isaiah Serge, NP Taking Active Self  nortriptyline (PAMELOR) 10 MG capsule [id number] Yes Take 2 capsules (20 mg total) by mouth at bedtime. Marcial Pacas, MD Taking Active Self  pantoprazole (PROTONIX) 40 MG tablet [id number] Yes Take 1 tablet (40 mg total) by mouth 2 (two) times daily. Kozlow, Donnamarie Poag, MD Taking Active   potassium chloride SA (KLOR-CON) 20 MEQ tablet [id number] Yes Take 20 mEq by mouth in the  morning. [provider] Taking Active Self  RESTASIS 0.05 % ophthalmic emulsion [id number] Yes Place 1 drop into both eyes 2 (two) times daily. [provider] Taking Active Self  sertraline (ZOLOFT) 25 MG tablet [id number] Yes Take 1 tablet (25 mg total) by mouth daily. [name], MD Taking Active   sodium chloride (OCEAN) 0.65 % SOLN nasal spray [id number] Yes PLACE 1 SPRAY INTO BOTH NOSTRILS AS NEEDED FOR CONGESTION. [name], DO Taking Active Self  SUMAtriptan (IMITREX) 25 MG tablet [id number] Yes Take 1 tablet (25 mg total) by mouth every 2 (two) hours as needed for migraine. May repeat in 2 hours if headache persists or recurs. Marcial Pacas, MD Taking Active Self  SYMBICORT 160-4.5 MCG/ACT inhaler [id number] Yes Use 2 puffs 1-2 times a day Kozlow, Donnamarie Poag, MD Taking Active   triamcinolone (NASACORT) 55 MCG/ACT AERO nasal inhaler [id number] Yes Place 1 spray into the nose 2 (two) times daily. 1 spray each nostril 2 times per day Jiles Prows, MD Taking Active               Assessment/Plan:   Pain: - Uncontrolled - Reviewed drug interactions. Celecoxib does not have a risk in combination with Dovato. Could switch nabumetone to celecoxib moving forward.  - Discussed trial of duloxetine. However, patient is not interested in changing any psychiatric medications at this time given improvement in mental health with sertraline.  - Encouraged to continue to collaborate with PM&R, Orthopedics, and Primary care for pain management and support.   Anxiety: - Improved - Could consider increasing dose of nortriptyline to see if benefit with reported pain, especially as patient reports the pain impacts sleep.   Diabetes: - Controlled - Recommend to continue current regimen at this time  Hypertension: - Controlled - Recommend to continue current regimen at this time  HIV: - Continue collaboration with ID as scheduled   Hyperlipidemia/ASCVD Risk  Reduction - Uncontrolled, LDL slightly above goal of <70. Could consider addition of ezetimibe.  - Recommend to continue current regimen at this time  Asthma: - Controlled - Recommend to continue current regimen at this time  GERD: - Controlled - Recommend to continue current regimen at this time  Follow Up Plan: will collaborate with care team on recommendations above  Catie Hedwig Morton, PharmD, [place] [phone]

## 2022-03-24 NOTE — Progress Notes (Signed)
Bilateral sacroiliac injections under fluoroscopic guidance  Indication: Low back and buttocks pain not relieved by medication management and other conservative care.Has had failure of PT and OTC meds.  Hx HIV, Last CD4 Oct 2023  487  Informed consent was obtained after describing risks and benefits of the procedure with the patient, this includes bleeding, bruising, infection, paralysis and medication side effects. The patient wishes to proceed and has given written consent. The patient was placed in a prone position. The lumbar and sacral area was marked and prepped with Betadine. A 25-gauge 1-1/2 inch needle was inserted into the skin and subcutaneous tissue and 1 mL of 1% lidocaine was injected into each side. Then a 25-gauge 3 inch spinal needle was inserted under fluoroscopic guidance into the left sacroiliac joint. AP and lateral images were utilized. Omnipaque 180x0.5 mL under live fluoroscopy demonstrated no intravascular uptake. Then a solution containing one ML of 6 mg per mL Celestone in 2 ML of 2% lidocaine MPF was injected x1.5 mL. This same procedure was repeated on the right side using the same needle, injectate, and technique. Patient tolerated the procedure well. Post procedure instructions were given. Please see post procedure form.

## 2022-03-24 NOTE — Progress Notes (Signed)
  PROCEDURE RECORD [facility]   Name: CHARLYN VIALPANDO DOB:06/04/1960 MRN: [id number]  Date:03/24/2022  Physician: Alysia Penna, MD    Nurse/CMA: Jorja Loa MA  Allergies:  Allergies  Allergen Reactions   Acetaminophen Other (See Comments)    Inflamed liver, hospitalized     Morphine Sulfate Hives and Shortness Of Breath   Triamterene Hives   Aspirin-Caffeine Other (See Comments)    liver damage; upset stomach   Dyazide [Hydrochlorothiazide W-Triamterene] Hives   Jardiance [Empagliflozin] Diarrhea    (Jardiance) stomach ache    Lisinopril     Cough    Metformin Hcl Er Other (See Comments)     upset stomach    Topamax [Topiramate] Other (See Comments)    Vision disturbances.   Citalopram Itching and Rash   Emtricitabine-Tenofovir Df Rash     Descovy   Losartan Nausea Only and Rash    Pt had rash, worsening dizziness, and nausea after starting losartan, which improved after stopping losartan   Triamterene-Hctz Rash    Consent Signed: Yes.    Is patient diabetic? Yes.    CBG today? 45  Pregnant: No. LMP: No LMP recorded. Patient is postmenopausal. (age 40-55)  Anticoagulants: no Anti-inflammatory: no Antibiotics: no  Procedure: Bilateral SI Injections.   Position: Prone Start Time: 12:00 PM  End Time: 12:05 PM  Fluoro Time: 29  RN/CMA Hollister Wessler MA Aidian Salomon MA    Time 11:23 am 12:10 PM    BP 136/84 142/82    Pulse 81 93    Respirations 16 16    O2 Sat 98 96    S/S 6 6    Pain Level 10 0     D/C home with [place], [name] & O X 3, D/C instructions reviewed, and sits independently.

## 2022-03-25 NOTE — Addendum Note (Signed)
Addended by: Franchot Gallo on: 03/25/2022 09:42 AM   Modules accepted: Orders

## 2022-03-31 ENCOUNTER — Encounter (INDEPENDENT_AMBULATORY_CARE_PROVIDER_SITE_OTHER): Payer: Self-pay

## 2022-03-31 ENCOUNTER — Other Ambulatory Visit: Payer: Self-pay

## 2022-03-31 VITALS — BP 146/88 | HR 91 | Temp 98.0°F | Wt 175.1 lb

## 2022-03-31 DIAGNOSIS — Z006 Encounter for examination for normal comparison and control in clinical research program: Secondary | ICD-10-CM

## 2022-04-01 NOTE — Research (Signed)
[name] [name] was seen for a screening visit for the Eyewitness study, A Phase 3b, Multicenter, single-arm open-label study evaluating the efficacy, safety and tolerability of switching to DTG/3TC single tablet regimen administered once daily from a bictegravir/emtricitabine/tenofivir alafenamide single tablet regimen in people living with HIV of at least 60 years of age who are virologically suppressed. Risk, benefits, responsibilities, and other options were reviewed. Comprehension of the study was assessed and questions were answered. Verbalized understanding. Signed informed consent obtained and witnessed by study coordinator prior to any procedures being completed. After reviewing the medical history and  concomitant medications it was determined that participant did not meet study eligibility due to chronic treatment with oral steroids for her Addison's disease. The participant was notified and verbalized understanding.

## 2022-04-04 ENCOUNTER — Other Ambulatory Visit: Payer: Medicaid Other | Admitting: Obstetrics and Gynecology

## 2022-04-04 NOTE — Patient Outreach (Signed)
  Medicaid Managed Care   Unsuccessful Attempt Note   04/04/2022 Name: [name] [name] MRN: [id number] DOB: November 16, 1960  Referred by: [name], MD Reason for referral : High Risk Managed Medicaid (Unsuccessful telephone outreach)   An unsuccessful telephone outreach was attempted today. The patient was referred to the case management team for assistance with care management and care coordination.    Follow Up Plan: The Managed Medicaid care management team will reach out to the patient again over the next 30 business  days. and The  Patient has been provided with contact information for the Managed Medicaid care management team and has been advised to call with any health related questions or concerns.    Aida Raider RN, BSN [place]  Triad Curator - Managed Medicaid High Risk [phone]

## 2022-04-04 NOTE — Patient Instructions (Signed)
Hi [name] [name], sorry I did not reach you today-I hope all is well- as a part of your Medicaid benefit, you are eligible for care management and care coordination services at no cost or copay. I was unable to reach you by phone today but would be happy to help you with your health related needs. Please feel free to call me at [phone].  A member of the Managed Medicaid care management team will reach out to you again over the next 30 business  days.   Aida Raider RN, BSN [place]  Triad Curator - Managed Medicaid High Risk [phone].

## 2022-04-05 ENCOUNTER — Encounter: Payer: Self-pay | Admitting: Obstetrics and Gynecology

## 2022-04-05 ENCOUNTER — Other Ambulatory Visit: Payer: Self-pay | Admitting: Student

## 2022-04-05 ENCOUNTER — Other Ambulatory Visit: Payer: Medicaid Other | Admitting: Obstetrics and Gynecology

## 2022-04-05 DIAGNOSIS — I10 Essential (primary) hypertension: Secondary | ICD-10-CM

## 2022-04-05 DIAGNOSIS — F410 Panic disorder [episodic paroxysmal anxiety] without agoraphobia: Secondary | ICD-10-CM

## 2022-04-05 NOTE — Patient Outreach (Signed)
Care Coordination  04/05/2022  DIESHA ROSTAD 1960-09-07 [id number]  RNCM returned patient's phone call.  Patient with continued shoulder pain-rates pain as an 8 most of the time.  BP 135-142/87-88.  Blood sugars 112-134.  Patient feels like Sertraline is helping with anxiety.  RNCM will follow up with Managed Medicaid Pharmacist regarding shoulder pain and possible feedback from providers.    Aida Raider RN, BSN [place]  Triad Curator - Managed Medicaid High Risk [phone].

## 2022-04-06 ENCOUNTER — Encounter: Payer: Self-pay | Admitting: Obstetrics and Gynecology

## 2022-04-06 ENCOUNTER — Other Ambulatory Visit: Payer: Medicaid Other | Admitting: Obstetrics and Gynecology

## 2022-04-06 ENCOUNTER — Telehealth: Payer: Self-pay | Admitting: Orthopedic Surgery

## 2022-04-06 NOTE — Patient Outreach (Signed)
Care Coordination  04/06/2022  [name] [name] [birth year] [id number]  RNCM called patient to relay Pharmacy information at [facility] Pharmacist request-  - can you inform patient that we recommend she schedule an appointment with Orthopedics (Dr. Marlou Sa) to discuss a prescription for Celebrex? ID provider was comfortable with co-administration of Celebrex and Biktarvy. We can keep an eye on renal function.   Patient states she will contact Dr. Randel Pigg office today.  Aida Raider RN, BSN [name]  Triad Curator - Managed Medicaid High Risk [phone].

## 2022-04-06 NOTE — Telephone Encounter (Signed)
Patient called advised she spoke with Cassie pharmacist at [facility] and was told she can take Celebrex. Patient asked if she can get a Rx sent to the pharmacy.  The number to contact patient is [phone]    The number to the [facility] nurse is [place]

## 2022-04-07 ENCOUNTER — Other Ambulatory Visit: Payer: Self-pay | Admitting: Allergy and Immunology

## 2022-04-08 ENCOUNTER — Other Ambulatory Visit: Payer: Self-pay | Admitting: Surgical

## 2022-04-08 ENCOUNTER — Other Ambulatory Visit: Payer: Self-pay | Admitting: Neurology

## 2022-04-08 ENCOUNTER — Telehealth: Payer: Self-pay | Admitting: Surgical

## 2022-04-08 MED ORDER — CELECOXIB 100 MG PO CAPS: 100.0000 mg | ORAL_CAPSULE | Freq: Two times a day (BID) | ORAL | 0 refills | Status: DC

## 2022-04-08 NOTE — Telephone Encounter (Signed)
Sent in RX. Dont take with nsaids such as relafen, ibuprofen, aleve

## 2022-04-08 NOTE — Telephone Encounter (Signed)
Prescription was sent in.

## 2022-04-08 NOTE — Telephone Encounter (Signed)
Patient states she spoke to [facility]  and was told to get an Rx for Celebrix to help with her discomfort. Please advise..[phone]. Patient states she spoke to [phone]

## 2022-04-11 NOTE — Telephone Encounter (Signed)
LMVM advising

## 2022-04-14 ENCOUNTER — Telehealth: Payer: Self-pay | Admitting: Neurology

## 2022-04-14 MED ORDER — NORTRIPTYLINE HCL 10 MG PO CAPS: 20.0000 mg | ORAL_CAPSULE | Freq: Every day | ORAL | 0 refills | Status: DC

## 2022-04-14 NOTE — Telephone Encounter (Signed)
Pt is needing a refill request sent in to the [facility] for her nortriptyline (PAMELOR) 10 MG capsule

## 2022-04-14 NOTE — Telephone Encounter (Signed)
Refill sent.

## 2022-04-26 ENCOUNTER — Other Ambulatory Visit: Payer: Self-pay

## 2022-04-26 DIAGNOSIS — B20 Human immunodeficiency virus [HIV] disease: Secondary | ICD-10-CM

## 2022-04-26 MED ORDER — BIKTARVY 50-200-25 MG PO TABS: 1.0000 | ORAL_TABLET | Freq: Every day | ORAL | 8 refills | Status: DC

## 2022-04-27 ENCOUNTER — Other Ambulatory Visit: Payer: Self-pay | Admitting: Surgical

## 2022-05-06 ENCOUNTER — Other Ambulatory Visit: Payer: Medicaid Other | Admitting: Obstetrics and Gynecology

## 2022-05-06 ENCOUNTER — Encounter: Payer: Self-pay | Admitting: Obstetrics and Gynecology

## 2022-05-06 NOTE — Patient Instructions (Signed)
Hi [name] [name]-always wonderful to speak with you-have a terrific afternoon and great weekend!!  [name] [name] was given information about Medicaid Managed Care team care coordination services as a part of their [place] Medicaid benefit. [name] [name] verbally consented to engagement with the [facility] Managed Care team.   If you are experiencing a medical emergency, please call 911 or report to your local emergency department or urgent care.   If you have a non-emergency medical problem during routine business hours, please contact your provider's office and ask to speak with a nurse.   For questions related to your [facility], please call: [phone] or visit the homepage here: [email or web address]  If you would like to schedule transportation through your [facility], please call the following number at least 2 days in advance of your appointment: [phone]   Rides for urgent appointments can also be made after hours by calling Member Services.  Call the [place] at [phone], at any time, 24 hours a day, 7 days a week. If you are in danger or need immediate medical attention call 911.  If you would like help to quit smoking, call 1-800-QUIT-NOW 806-284-4432) OR Espaol: 1-855-Djelo-Ya (3-762-831-5176) o para ms informacin haga clic aqu or Text READY to 200-400 to register via text  [name] [name] - following are the goals we discussed in your visit today:   Goals Addressed             This Visit's Progress    Protect My Health       Timeframe:  Long-Range Goal Priority:  Medium Start Date:      05/07/20                       Expected End Date: ongoing             Follow Up Date:06/03/22 - schedule appointment for vaccines needed due to my age or health - schedule recommended health tests (blood work, mammogram, colonoscopy, pap  test) - schedule and keep appointment for annual check-up    Why is this important?   Screening tests can find diseases early when they are easier to treat.  Your doctor or nurse will talk with you about which tests are important for you.  Getting shots for common diseases like the flu and shingles will help prevent them.   05/06/22:  Patient up to date on all appointments   Patient verbalizes understanding of instructions and care plan provided today and agrees to view in [place]. Active MyChart status and patient understanding of how to access instructions and care plan via MyChart confirmed with patient.     The Managed Medicaid care management team will reach out to the patient again over the next 30 business  days.  The  Patient  has been provided with contact information for the Managed Medicaid care management team and has been advised to call with any health related questions or concerns.   Aida Raider RN, BSN [place] Management Coordinator - Managed Medicaid High Risk [phone]   Following is a copy of your plan of care:  Care Plan : General Plan of Care (Adult)  Updates made by Gayla Medicus, RN since 05/06/2022 12:00 AM     Problem: Health Promotion or Disease Self-Management (General Plan of Care)   Priority: Medium  Onset Date: 08/10/2020     Long-Range Goal:  Self-Management Plan Developed   Start Date: 05/07/2020  Expected End Date: 05/03/2022  Recent Progress: On track  Priority: Medium  Note:   Current Barriers:  Chronic Disease Management support and education needs related to DM, HTN, asthma, GERD, anxiety, osteoarthritis, HLD, HIV 05/06/22:  Patient continues to have left shoulder pain and has switched to Celebrex which is helping more.  Blood sugars 70-128.  BP 132-151/77-84.  Patient to start checking BP everyday and if elevated notify provider, started on HCTZ recently.   Denies any chest pain and/or SOB.  Patient started on  Zoloft and likes this better, stopped Wellbutrin. Thinking about starting Dovato.      Nurse Case Manager Clinical Goal(s):  Over the next 30 days, patient will attend all scheduled medical appointments  Interventions:  Inter-disciplinary care team collaboration (see longitudinal plan of care) Evaluation of current treatment plan and patient's adherence to plan as established by provider. Reviewed medications with patient. Collaborated with pharmacy regarding medications. Discussed plans with patient for ongoing care management follow up and provided patient with direct contact information for care management team Reviewed scheduled/upcoming provider appointments. Pharmacy referral for medication review Patient provided with [facility] transportation information. 04/06/22:  followed up with Pharmacy for patient regarding medications  Patient Goals/Self-Care Activities Over the next 30 days, patient will:  -Attends all scheduled provider appointments Calls pharmacy for medication refills Calls provider office for new concerns or questions  Follow Up Plan: The Managed Medicaid care management team will reach out to the patient again over the next 30 business  days.  The patient has been provided with contact information for the Managed Medicaid care management team and has been advised to call with any health related questions or concerns.

## 2022-05-06 NOTE — Patient Outreach (Signed)
Medicaid Managed Care   Nurse Care Manager Note  05/06/2022 Name:  [name] [name] MRN:  [id number] DOB:  28-Nov-1960  [name] [name] is an 62 y.o. year old female who is a primary patient of [name], MD.  The [facility] Managed Care Coordination team was consulted for assistance with:    Chronic healthcare management needs, DM, HTN, asthma, GERD, anxiety, osteoarthritis, HLD, HIV  Ms. [name] was given information about Medicaid Managed Care Coordination team services today. [name] [name] Patient agreed to services and verbal consent obtained.  Engaged with patient by telephone for follow up visit in response to provider referral for case management and/or care coordination services.   Assessments/Interventions:  Review of past medical history, allergies, medications, health status, including review of consultants reports, laboratory and other test data, was performed as part of comprehensive evaluation and provision of chronic care management services.  SDOH (Social Determinants of Health) assessments and interventions performed: SDOH Interventions    Flowsheet Row Patient Outreach Telephone from 05/06/2022 in [place] Patient Outreach Telephone from 04/06/2022 in [place] Patient Outreach Telephone from 04/05/2022 in [place] Patient Outreach Telephone from 03/02/2022 in [place] Patient Outreach Telephone from 01/31/2022 in [place] Coordination Patient Outreach Telephone from 12/28/2021 in [place] Coordination  SDOH Interventions        Food Insecurity Interventions Intervention Not Indicated -- -- -- -- Intervention Not Indicated  Housing Interventions -- -- -- Intervention Not Indicated -- --  Transportation Interventions -- Other (Comment)  [UHC transportation] -- -- -- --  Utilities Interventions  Intervention Not Indicated -- -- -- -- Intervention Not Indicated  Alcohol Usage Interventions -- -- Intervention Not Indicated (Score <7) -- -- --  Financial Strain Interventions -- -- -- -- Intervention Not Indicated --  Physical Activity Interventions -- -- -- -- Intervention Not Indicated --  Stress Interventions -- -- Intervention Not Indicated -- -- --  Social Connections Interventions -- -- -- Intervention Not Indicated -- --     Care Plan  Allergies  Allergen Reactions   Acetaminophen Other (See Comments)    Inflamed liver, hospitalized     Morphine Sulfate Hives and Shortness Of Breath   Triamterene Hives   Aspirin-Caffeine Other (See Comments)    liver damage; upset stomach   Dyazide [Hydrochlorothiazide W-Triamterene] Hives   Jardiance [Empagliflozin] Diarrhea    (Jardiance) stomach ache    Lisinopril     Cough    Metformin Hcl Er Other (See Comments)     upset stomach    Topamax [Topiramate] Other (See Comments)    Vision disturbances.   Citalopram Itching and Rash   Emtricitabine-Tenofovir Df Rash     Descovy   Losartan Nausea Only and Rash    Pt had rash, worsening dizziness, and nausea after starting losartan, which improved after stopping losartan   Triamterene-Hctz Rash   Medications Reviewed Today     Reviewed by Gayla Medicus, RN (Registered Nurse) on 05/06/22 at [place] List Status: <None>   Medication Order Taking? Sig Documenting Provider Last Dose Status Informant  albuterol (VENTOLIN HFA) 108 (90 Base) MCG/ACT inhaler [id number] No Inhale 2 puffs into the lungs every 4 (four) hours as needed for wheezing or shortness of breath. Jiles Prows, MD Taking Active   amLODipine (NORVASC) 10 MG tablet [id number] No TAKE ONE TABLET BY MOUTH DAILY [name],  MD Taking Active   atorvastatin (LIPITOR) 10 MG tablet [id number] No TAKE HALF TABLET BY MOUTH DAILY Katsadouros, Vasilios, MD Taking Active Self  bictegravir-emtricitabine-tenofovir AF  (BIKTARVY) 50-200-25 MG TABS tablet [id number]  Take 1 tablet by mouth daily. Towner Callas, NP  Active   buPROPion (WELLBUTRIN XL) 300 MG 24 hr tablet [id number] No TAKE ONE TABLET BY MOUTH DAILY [name], MD Taking Active Self  celecoxib (CELEBREX) 100 MG capsule [id number]  TAKE 1 CAPSULE (100 MG TOTAL) BY MOUTH TWO (TWO) TIMES DAILY. Magnant, Gerrianne Scale, PA-C  Active   cetirizine (ZYRTEC) 10 MG tablet [id number] No Take 1-2 tablets 1-2 times a day for itch Kozlow, Donnamarie Poag, MD Taking Active   chlorhexidine (PERIDEX) 0.12 % solution [id number] No USE AS DIRECTED 15 MLS IN THE MOUTH OR THROAT TWICE A DAY Dixon, Melton Krebs, NP Taking Active   cholecalciferol (VITAMIN D) 25 MCG (1000 UNIT) tablet [id number] No Take 1 tablet (1,000 Units total) by mouth daily. [name], MD Taking Active   ciclopirox [facility]) 8 % solution [id number] No APPLY OVER NAIL AND SURROUNDING SKIN AT BEDTIME OVER PREVIOUS COAT. REMOVE AFTER 7DAYS/REPEAT Trula Slade, DPM Taking Active   cyclobenzaprine (FLEXERIL) 10 MG tablet [id number] No Take 1 tablet (10 mg total) by mouth 3 (three) times daily as needed for muscle spasms.  Patient not taking: Reported on 03/24/2022   Donella Stade, PA-C Not Taking Active Self  diphenoxylate-atropine (LOMOTIL) 2.5-0.025 MG tablet [id number] No Take 1 tablet by mouth 4 (four) times daily as needed for diarrhea or loose stools. Wolford Callas, NP Taking Active Self  famotidine (PEPCID) 40 MG tablet [id number] No Take 1 tablet (40 mg total) by mouth at bedtime. Kozlow, Donnamarie Poag, MD Taking Active   hydrochlorothiazide (MICROZIDE) 12.5 MG capsule [id number]  TAKE ONE CAPSULE BY MOUTH DAILY [name], MD  Active   hydrocortisone (CORTEF) 5 MG tablet [id number] No Take 2.5-5 mg by mouth See admin instructions. Take 1 tablet (5 mg) by mouth in the morning (scheduled) & may taken an additional 0.5 tablet (2.5 mg) by mouth in the evening if needed. [provider]  Taking Active Self           Med Note Jodi Mourning, Tillie Fantasia Mar 24, 2022  2:57 PM) 2.5 mg once daily  liraglutide (VICTOZA) 18 MG/3ML SOPN [id number] No Inject 1.2 mg into the skin every evening. [provider] Taking Active Self  methocarbamol (ROBAXIN) 500 MG tablet [id number] No Take 1 tablet (500 mg total) by mouth every 8 (eight) hours as needed for muscle spasms.  Patient not taking: Reported on 03/24/2022   Meredith Pel, MD Not Taking Active   metoprolol tartrate (LOPRESSOR) 50 MG tablet [id number] No Take 0.5 tablets (25 mg total) by mouth 2 (two) times daily. Richardson Dopp T, PA-C Taking Active   montelukast (SINGULAIR) 10 MG tablet [id number] No Take 1 tablet (10 mg total) by mouth at bedtime. Jiles Prows, MD Taking Active   NEURONTIN 100 MG capsule [id number]  TAKE TWO CAPSULES BY MOUTH THREE TIMES A DAY Kozlow, Donnamarie Poag, MD  Active   nitroGLYCERIN (NITROSTAT) 0.4 MG SL tablet [id number] No Place 1 tablet (0.4 mg total) under the tongue every 5 (five) minutes as needed for chest pain. Isaiah Serge, NP Taking Active Self  nortriptyline (PAMELOR) 10 MG capsule [id number]  Take 2 capsules (20 mg total) by mouth at bedtime. Marcial Pacas, MD  Active  pantoprazole (PROTONIX) 40 MG tablet [id number] No Take 1 tablet (40 mg total) by mouth 2 (two) times daily. Kozlow, Donnamarie Poag, MD Taking Active   potassium chloride SA (KLOR-CON) 20 MEQ tablet [id number] No Take 20 mEq by mouth in the morning. [provider] Taking Active Self  RESTASIS 0.05 % ophthalmic emulsion [id number] No Place 1 drop into both eyes 2 (two) times daily. [provider] Taking Active Self  sertraline (ZOLOFT) 25 MG tablet [id number]  TAKE 1 TABLET (25 MG TOTAL) BY MOUTH DAILY [name], MD  Active   sodium chloride (OCEAN) 0.65 % SOLN nasal spray [id number] No PLACE 1 SPRAY INTO BOTH NOSTRILS AS NEEDED FOR CONGESTION. [name], DO Taking Active Self  SUMAtriptan (IMITREX)  25 MG tablet [id number] No Take 1 tablet (25 mg total) by mouth every 2 (two) hours as needed for migraine. May repeat in 2 hours if headache persists or recurs.  Patient not taking: Reported on 03/24/2022   Marcial Pacas, MD Not Taking Active Self  SYMBICORT 160-4.5 MCG/ACT inhaler [id number] No Use 2 puffs 1-2 times a day Kozlow, Donnamarie Poag, MD Taking Active   triamcinolone (NASACORT) 55 MCG/ACT AERO nasal inhaler [id number] No Place 1 spray into the nose 2 (two) times daily. 1 spray each nostril 2 times per day Jiles Prows, MD Taking Active            Patient Active Problem List   Diagnosis Date Noted   Osteoporosis 03/15/2022   Anxiety 03/15/2022   Prediabetes 02/18/2022   Synovitis of left shoulder    Impingement syndrome of left shoulder 07/01/2021   Grief reaction 06/08/2020   Generalized anxiety disorder with panic attacks 06/20/2019   Osteoarthritis of thumb, right 11/29/2018   Chronic migraine w/o aura w/o status migrainosus, not intractable 08/27/2018   Disorder of left eustachian tube 05/19/2017   Vertigo of central origin 01/23/2017   Eustachian tube dysfunction, bilateral 06/15/2016   Ear pain, right 06/15/2016   Spondylosis of lumbar region without myelopathy or radiculopathy 10/08/2015   BPPV (benign paroxysmal positional vertigo) 10/02/2015   Liver fibrosis ([place]) 12/04/2014   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   Vitamin D deficiency 06/29/2014   Secondary adrenal insufficiency ([place]) 06/29/2014   History of tympanostomy tube placement 02/07/2014   Refusal of blood transfusions as patient is Jehovah's Witness 11/18/2013   Hepatitis C virus infection cured after antiviral drug therapy 03/16/2012   Health care maintenance 12/15/2011   Opioid dependence ([place]) 10/05/2010   HTN (hypertension) 10/05/2010   Hyperlipidemia associated with type 2 diabetes mellitus ([place]) 10/23/2008   HIV disease ([place]) 05/09/2006    Depression 05/09/2006   Allergic rhinitis 05/09/2006   GERD 05/09/2006   Conditions to be addressed/monitored per PCP order:  Chronic healthcare management needs, DM, HTN, asthma, GERD, anxiety, osteoarthritis, HLD, HIV  Care Plan : General Plan of Care (Adult)  Updates made by Gayla Medicus, RN since 05/06/2022 12:00 AM     Problem: Health Promotion or Disease Self-Management (General Plan of Care)   Priority: Medium  Onset Date: 08/10/2020     Long-Range Goal: Self-Management Plan Developed   Start Date: 05/07/2020  Expected End Date: 05/03/2022  Recent Progress: On track  Priority: Medium  Note:   Current Barriers:  Chronic Disease Management support and education needs related to DM, HTN, asthma, GERD, anxiety, osteoarthritis, HLD, HIV 05/06/22:  Patient continues to have left shoulder pain and has switched to Celebrex  which is helping more.  Blood sugars 70-128.  BP 132-151/77-84.  Patient to start checking BP everyday and if elevated notify provider, started on HCTZ recently.   Denies any chest pain and/or SOB.  Patient started on Zoloft and likes this better, stopped Wellbutrin. Thinking about starting Dovato.      Nurse Case Manager Clinical Goal(s):  Over the next 30 days, patient will attend all scheduled medical appointments  Interventions:  Inter-disciplinary care team collaboration (see longitudinal plan of care) Evaluation of current treatment plan and patient's adherence to plan as established by provider. Reviewed medications with patient. Collaborated with pharmacy regarding medications. Discussed plans with patient for ongoing care management follow up and provided patient with direct contact information for care management team Reviewed scheduled/upcoming provider appointments. Pharmacy referral for medication review Patient provided with [facility] information. 04/06/22:  followed up with Pharmacy for patient regarding medications  Patient Goals/Self-Care  Activities Over the next 30 days, patient will:  -Attends all scheduled provider appointments Calls pharmacy for medication refills Calls provider office for new concerns or questions  Follow Up Plan: The Managed Medicaid care management team will reach out to the patient again over the next 30 business  days.  The patient has been provided with contact information for the Managed Medicaid care management team and has been advised to call with any health related questions or concerns.    Follow Up:  Patient agrees to Care Plan and Follow-up.  Plan: The Managed Medicaid care management team will reach out to the patient again over the next 30 business  days. and The  Patient has been provided with contact information for the Managed Medicaid care management team and has been advised to call with any health related questions or concerns.  Date/time of next scheduled RN care management/care coordination outreach:  06/03/22 at 0900.

## 2022-05-10 ENCOUNTER — Other Ambulatory Visit: Payer: Self-pay | Admitting: Allergy and Immunology

## 2022-05-10 ENCOUNTER — Other Ambulatory Visit: Payer: Self-pay | Admitting: Infectious Diseases

## 2022-05-10 ENCOUNTER — Other Ambulatory Visit: Payer: Self-pay | Admitting: Podiatry

## 2022-05-10 DIAGNOSIS — R682 Dry mouth, unspecified: Secondary | ICD-10-CM

## 2022-05-30 ENCOUNTER — Ambulatory Visit (INDEPENDENT_AMBULATORY_CARE_PROVIDER_SITE_OTHER): Payer: Medicaid Other | Admitting: Student

## 2022-05-30 ENCOUNTER — Ambulatory Visit: Payer: Medicaid Other | Admitting: Surgical

## 2022-05-30 ENCOUNTER — Telehealth: Payer: Self-pay | Admitting: *Deleted

## 2022-05-30 ENCOUNTER — Other Ambulatory Visit: Payer: Self-pay | Admitting: Student

## 2022-05-30 DIAGNOSIS — Z87891 Personal history of nicotine dependence: Secondary | ICD-10-CM | POA: Diagnosis not present

## 2022-05-30 DIAGNOSIS — J069 Acute upper respiratory infection, unspecified: Secondary | ICD-10-CM | POA: Diagnosis not present

## 2022-05-30 MED ORDER — HYDROCOD POLI-CHLORPHE POLI ER 10-8 MG/5ML PO SUER: 5.0000 mL | Freq: Two times a day (BID) | ORAL | 0 refills | Status: DC | PRN

## 2022-05-30 MED ORDER — GUAIFENESIN-CODEINE 100-10 MG/5ML PO SOLN: 5.0000 mL | Freq: Three times a day (TID) | ORAL | 0 refills | Status: DC | PRN

## 2022-05-30 NOTE — Progress Notes (Signed)
[facility] Internal Medicine Residency Telephone Encounter Continuity Care Appointment  HPI:  This telephone encounter was created for [name] on 05/30/2022 for the following purpose/cc: cold and cough. Verified identity via DOB and home address. [name] [name] states that she started feeling sick last Tuesday/Wednesday. Her symptoms began with a headache and generalized malaise. On Thursday, she developed a cough, which has since been productive of clear sputum. She denies any fevers, chills, nausea, vomiting or other systemic symptoms. She does endorse nasal congestion as well. She has been maintaining adequate hydration and food intake. She does note having taken a home COVID-19 test which was negative. She has been using Theraflu tea packets (once a day) and mucinex-DM to help with her symptoms. She states that the Theraflu has been helping with her symptoms but the mucinex-DM is not helping to abate her cough. Her cough has been worsening and she develops significant bouts of coughing that lead to rib/side pain. Her other symptoms are bearable, but she would like something stronger to help with her cough.  I do believe that her symptoms are likely from a viral URI and given she is about 6 days since onset of symptoms, no utility in testing further. We discussed continuing to symptomatically treat her cold. She has used Coricidin HBP (given HTN) in the past but states that she does not think it helps at all. She will continue taking theraflu tea packets as needed up to twice a day. She is using her home symbicort inhaler. Given that mucinex-DM is not helping her cough and she is having bouts leading to rib/side pain, will prescribe a course of Tussionex to help manage her symptoms. Provided return precautions (high fever, SHOB, worsening cough/sputum production) and also advised to reach out to [facility] for an in-person visit should her symptoms worsen over the next week or two. She confirms  understanding.   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 s/p 12 week course of simeprevir and sofosbuvir between October 2014 and January 2015 with resolution.   History of shingles    HIV infection ([place])    1994   Hyperlipidemia    no meds taken now   Hypertension    Migraine    Pituitary microadenoma ([place]) 08/08/2014   Pneumonia    Prediabetes    Refusal of blood transfusions as patient is Jehovah's Witness    Secondary adrenal insufficiency ([place]) 06/29/2014   Urticaria      ROS:  Negative aside from that listed in HPI.   Assessment / Plan / Recommendations:  Please see A&P under problem oriented charting for assessment of the patient's acute and chronic medical conditions.  As always, pt is advised that if symptoms worsen or new symptoms arise, they should go to an urgent care facility or to to ER for further evaluation.   Consent and Medical Decision Making:  Patient discussed with Dr. Angelia Mould This is a telephone encounter between [name] [name] and Virl Axe on 05/30/2022 for cough and cold. The visit  was conducted with the patient located at home and Virl Axe at [facility]. The patient's identity was confirmed using their DOB and current address. The patient has consented to being evaluated through a telephone encounter and understands the associated risks (an examination cannot be done and the patient may need to come in for an appointment) / benefits (allows the patient to remain at home, decreasing exposure to coronavirus). I personally spent 18 minutes on medical discussion.

## 2022-05-30 NOTE — Assessment & Plan Note (Signed)
[name] [name] states that she started feeling sick last Tuesday/Wednesday. Her symptoms began with a headache and generalized malaise. On Thursday, she developed a cough, which has since been productive of clear sputum. She denies any fevers, chills, nausea, vomiting or other systemic symptoms. She does endorse nasal congestion as well. She has been maintaining adequate hydration and food intake. She does note having taken a home COVID-19 test which was negative.  She has been using Theraflu tea packets (once a day) and mucinex-DM to help with her symptoms. She states that the Theraflu has been helping with her symptoms but the mucinex-DM is not helping to abate her cough. Her cough has been worsening and she develops significant bouts of coughing that lead to rib/side pain. Her other symptoms are bearable, but she would like something stronger to help with her cough.  I do believe that her symptoms are likely from a viral URI and given she is about 6 days since onset of symptoms, no utility in testing further. We discussed continuing to symptomatically treat her cold. She has used Coricidin HBP (given HTN) in the past but states that she does not think it helps at all. She will continue taking theraflu tea packets as needed up to twice a day. She is using her home symbicort inhaler. Given that mucinex-DM is not helping her cough and she is having bouts leading to rib/side pain, will prescribe a course of Tussionex to help manage her symptoms.  Provided return precautions (high fever, SHOB, worsening cough/sputum production) and also advised to reach out to [facility] for an in-person visit should her symptoms worsen over the next week or two. She confirms understanding.  Plan: -OTC symptom relief (theraflu) -prescribed short course of Tussionex for cough -provided return precautions

## 2022-05-30 NOTE — Telephone Encounter (Signed)
Call from pt who stated she had a telehealth appt this am with Dr Allyson Sabal and was prescribed Tussionex. Her insurance does not cover this med and cost to pt is $60.00. Requesting doctor to prescribe something else. Thanks

## 2022-05-31 MED ORDER — GUAIFENESIN-CODEINE 100-10 MG/5ML PO SOLN: 5.0000 mL | Freq: Three times a day (TID) | ORAL | 0 refills | Status: DC | PRN

## 2022-05-31 NOTE — Telephone Encounter (Signed)
guaiFENesin-codeine 100-10 MG/5ML syrup was sent as"Print" ; please re-send electronically as "Normal". Thanks

## 2022-05-31 NOTE — Telephone Encounter (Signed)
Pt called / informed new rx has been sent electronically to the pharmacy.

## 2022-05-31 NOTE — Addendum Note (Signed)
Addended by: Virl Axe on: 05/31/2022 09:37 AM   Modules accepted: Orders

## 2022-06-01 NOTE — Progress Notes (Signed)
Internal Medicine Clinic Attending  Case discussed with the resident at the time of the visit.  We reviewed the resident's history and exam and pertinent patient test results.  I agree with the assessment, diagnosis, and plan of care documented in the resident's note.  

## 2022-06-02 ENCOUNTER — Other Ambulatory Visit: Payer: Self-pay

## 2022-06-02 ENCOUNTER — Emergency Department (HOSPITAL_COMMUNITY)
Admission: EM | Admit: 2022-06-02 | Discharge: 2022-06-02 | Disposition: A | Discharge status: Home / Self Care | Payer: Medicaid Other | Attending: Emergency Medicine | Admitting: Emergency Medicine

## 2022-06-02 ENCOUNTER — Telehealth: Payer: Self-pay

## 2022-06-02 ENCOUNTER — Emergency Department (HOSPITAL_COMMUNITY): Payer: Medicaid Other

## 2022-06-02 DIAGNOSIS — Z21 Asymptomatic human immunodeficiency virus [HIV] infection status: Secondary | ICD-10-CM | POA: Diagnosis not present

## 2022-06-02 DIAGNOSIS — E119 Type 2 diabetes mellitus without complications: Secondary | ICD-10-CM | POA: Diagnosis not present

## 2022-06-02 DIAGNOSIS — Z79899 Other long term (current) drug therapy: Secondary | ICD-10-CM | POA: Insufficient documentation

## 2022-06-02 DIAGNOSIS — J45909 Unspecified asthma, uncomplicated: Secondary | ICD-10-CM | POA: Diagnosis not present

## 2022-06-02 DIAGNOSIS — Z7951 Long term (current) use of inhaled steroids: Secondary | ICD-10-CM | POA: Insufficient documentation

## 2022-06-02 DIAGNOSIS — Z794 Long term (current) use of insulin: Secondary | ICD-10-CM | POA: Insufficient documentation

## 2022-06-02 DIAGNOSIS — I509 Heart failure, unspecified: Secondary | ICD-10-CM | POA: Diagnosis not present

## 2022-06-02 DIAGNOSIS — J40 Bronchitis, not specified as acute or chronic: Secondary | ICD-10-CM | POA: Insufficient documentation

## 2022-06-02 DIAGNOSIS — R0602 Shortness of breath: Secondary | ICD-10-CM | POA: Diagnosis not present

## 2022-06-02 DIAGNOSIS — Z20822 Contact with and (suspected) exposure to covid-19: Secondary | ICD-10-CM | POA: Insufficient documentation

## 2022-06-02 DIAGNOSIS — R079 Chest pain, unspecified: Secondary | ICD-10-CM

## 2022-06-02 DIAGNOSIS — I110 Hypertensive heart disease with heart failure: Secondary | ICD-10-CM | POA: Insufficient documentation

## 2022-06-02 DIAGNOSIS — R0789 Other chest pain: Secondary | ICD-10-CM | POA: Diagnosis not present

## 2022-06-02 LAB — BASIC METABOLIC PANEL
Anion gap: 15 (ref 5–15)
BUN: 8 mg/dL (ref 6–20)
CO2: 22 mmol/L (ref 22–32)
Calcium: 8.6 mg/dL — ABNORMAL LOW (ref 8.9–10.3)
Chloride: 99 mmol/L (ref 98–111)
Creatinine, Ser: 0.81 mg/dL (ref 0.44–1.00)
GFR, Estimated: >60 mL/min (ref >60)
Glucose, Bld: 139 mg/dL — ABNORMAL HIGH (ref 70–99)
Potassium: 3.3 mmol/L — ABNORMAL LOW (ref 3.5–5.1)
Sodium: 136 mmol/L (ref 135–145)

## 2022-06-02 LAB — CBC
HCT: 46 % (ref 36.0–46.0)
Hemoglobin: 15.3 g/dL — ABNORMAL HIGH (ref 12.0–15.0)
MCH: 27.7 pg (ref 26.0–34.0)
MCHC: 33.3 g/dL (ref 30.0–36.0)
MCV: 83.2 fL (ref 80.0–100.0)
Platelets: 235 10*3/uL (ref 150–400)
RBC: 5.53 MIL/uL — ABNORMAL HIGH (ref 3.87–5.11)
RDW: 15.4 % (ref 11.5–15.5)
WBC: 6.9 10*3/uL (ref 4.0–10.5)
nRBC: 0 % (ref 0.0–0.2)

## 2022-06-02 LAB — BRAIN NATRIURETIC PEPTIDE: B Natriuretic Peptide: 5.1 pg/mL (ref 0.0–100.0)

## 2022-06-02 LAB — RESP PANEL BY RT-PCR (RSV, FLU A&B, COVID)  RVPGX2
Influenza A by PCR: NEGATIVE
Influenza B by PCR: NEGATIVE
Resp Syncytial Virus by PCR: NEGATIVE
SARS Coronavirus 2 by RT PCR: NEGATIVE

## 2022-06-02 LAB — TROPONIN I (HIGH SENSITIVITY)
Troponin I (High Sensitivity): <2 ng/L (ref <18)
Troponin I (High Sensitivity): 2 ng/L (ref <18)

## 2022-06-02 LAB — D-DIMER, QUANTITATIVE: D-Dimer, Quant: <0.27 ug/mL-FEU (ref 0.00–0.50)

## 2022-06-02 MED ORDER — PREDNISONE 20 MG PO TABS
40.0000 mg | ORAL_TABLET | Freq: Once | ORAL | Status: AC
  Administered 2022-06-02: 40 mg via ORAL
  Filled 2022-06-02: qty 2

## 2022-06-02 MED ORDER — IPRATROPIUM-ALBUTEROL 0.5-2.5 (3) MG/3ML IN SOLN
3.0000 mL | Freq: Once | RESPIRATORY_TRACT | Status: AC
  Administered 2022-06-02: 3 mL via RESPIRATORY_TRACT
  Filled 2022-06-02: qty 3

## 2022-06-02 MED ORDER — POTASSIUM CHLORIDE CRYS ER 20 MEQ PO TBCR
20.0000 meq | EXTENDED_RELEASE_TABLET | Freq: Once | ORAL | Status: AC
  Administered 2022-06-02: 20 meq via ORAL
  Filled 2022-06-02: qty 1

## 2022-06-02 MED ORDER — FENTANYL CITRATE PF 50 MCG/ML IJ SOSY
25.0000 ug | PREFILLED_SYRINGE | Freq: Once | INTRAMUSCULAR | Status: AC
  Administered 2022-06-02: 25 ug via INTRAVENOUS
  Filled 2022-06-02: qty 1

## 2022-06-02 MED ORDER — LIDOCAINE 5 % EX PTCH
1.0000 | MEDICATED_PATCH | CUTANEOUS | Status: DC
  Administered 2022-06-02: 1 via TRANSDERMAL
  Filled 2022-06-02: qty 1

## 2022-06-02 MED ORDER — PREDNISONE 10 MG PO TABS: 40.0000 mg | ORAL_TABLET | Freq: Every day | ORAL | 0 refills | Status: AC

## 2022-06-02 MED ORDER — SODIUM CHLORIDE 0.9 % IV BOLUS
500.0000 mL | Freq: Once | INTRAVENOUS | Status: AC
  Administered 2022-06-02: 500 mL via INTRAVENOUS

## 2022-06-02 NOTE — ED Triage Notes (Signed)
Pt here with reports of having flu like symptoms for the last week. Pt states today she developed chest pain while trying to get dressed. Pt visibly shob during triage. Hx addisons.

## 2022-06-02 NOTE — ED Notes (Signed)
Got patient on the monitor patient is resting with call bell in reach  

## 2022-06-02 NOTE — Telephone Encounter (Signed)
Patient called stating she is having active chest pain, advised patient she needs to go to the emergency room or she can call the ambulance to transport her. [name] [name] spoke with patient and advised her to go to the emergency room  06/02/22 9:00am

## 2022-06-02 NOTE — Discharge Instructions (Addendum)
You are seen today for chest pain cough and shortness of breath.  You do not have pneumonia your EKG and cardiac enzymes did not show any sign of heart attack.  Your blood test was negative for blood clot in your lungs. You likely have pain from so much coughing.  Since you do have history of asthma please continue using your inhaler I will start you on some steroids as this may be part of the cause of your cough.  You can take Tylenol to help with the pain as well.  Come back to the ER for new or worsening symptoms.  Follow-up closely with your primary care doctor.

## 2022-06-02 NOTE — ED Provider Notes (Signed)
[facility] Provider Note   CSN: [id number] Arrival date & time: 06/02/22  [id number]     History  Chief Complaint  Patient presents with   Chest Pain    [name] [name] is a 62 y.o. female.  She has multiple medical problems including hypertension high cholesterol, she is HIV positive.  Reports that she is compliant with her medications and viral load is undetectable.  She also has insufficiency on chronic steroids, she has got diabetes, GERD, CHF, and asthma.  She presents today for cough with shortness of breath.  Reports she had a cough, sinus congestion and production of small amounts of clear sputum 1 week ago.  She had a telephone visit with her primary care at the residency clinic.  She reported her TheraFlu was not helping her symptoms so she reports that they prescribe Tussionex.  She states she is starting to feel better but still not completely resolved so was going to go into the office today when she started having sharp left chest pain with significant shortness of breath while getting ready so decided to come to the ED instead of outpatient visit due to her severity of her symptoms.  She denies leg swelling and pain, history of VTE, she denies use of any hormonal medications.   Chest Pain      Home Medications Prior to Admission medications   Medication Sig Start Date End Date Taking? Authorizing Provider  predniSONE (DELTASONE) 10 MG tablet Take 4 tablets (40 mg total) by mouth daily for 4 days. 06/02/22 06/06/22 Yes Hether Anselmo A, PA-C  albuterol (VENTOLIN HFA) 108 (90 Base) MCG/ACT inhaler Inhale 2 puffs into the lungs every 4 (four) hours as needed for wheezing or shortness of breath. 03/15/22   Kozlow, Donnamarie Poag, MD  amLODipine (NORVASC) 10 MG tablet TAKE ONE TABLET BY MOUTH DAILY 02/24/22   [name], MD  atorvastatin (LIPITOR) 10 MG tablet TAKE HALF TABLET BY MOUTH DAILY 06/09/20   Riesa Pope, MD   bictegravir-emtricitabine-tenofovir AF (BIKTARVY) 50-200-25 MG TABS tablet Take 1 tablet by mouth daily. 04/26/22   Melvin Callas, NP  buPROPion (WELLBUTRIN XL) 300 MG 24 hr tablet TAKE ONE TABLET BY MOUTH DAILY 05/12/21   [name], MD  celecoxib (CELEBREX) 100 MG capsule TAKE 1 CAPSULE (100 MG TOTAL) BY MOUTH TWO (TWO) TIMES DAILY. 04/27/22   Magnant, Charles L, PA-C  cetirizine (ZYRTEC) 10 MG tablet Take 1-2 tablets 1-2 times a day for itch 03/15/22   Kozlow, Donnamarie Poag, MD  chlorhexidine (PERIDEX) 0.12 % solution USE AS DIRECTED 15 MLS IN THE MOUTH OR THROAT TWICE A DAY 03/14/22   Dunbar Callas, NP  cholecalciferol (VITAMIN D) 25 MCG (1000 UNIT) tablet Take 1 tablet (1,000 Units total) by mouth daily. 10/05/21   [name], MD  ciclopirox (PENLAC) 8 % solution APPLY OVER NAIL AND SURROUNDING SKIN AT BEDTIME OVER PREVIOUS COAT. REMOVE AFTER 7DAYS/REPEAT 05/10/22   Trula Slade, DPM  cyclobenzaprine (FLEXERIL) 10 MG tablet Take 1 tablet (10 mg total) by mouth 3 (three) times daily as needed for muscle spasms. Patient not taking: Reported on 03/24/2022 07/09/21   Magnant, Gerrianne Scale, PA-C  diphenoxylate-atropine (LOMOTIL) 2.5-0.025 MG tablet Take 1 tablet by mouth 4 (four) times daily as needed for diarrhea or loose stools. 08/09/21   [place] Callas, NP  famotidine (PEPCID) 40 MG tablet Take 1 tablet (40 mg total) by mouth at bedtime. 03/15/22   Allena Katz  J, MD  guaiFENesin-codeine 100-10 MG/5ML syrup Take 5 mLs by mouth 3 (three) times daily as needed for cough. 05/31/22   Virl Axe, MD  hydrochlorothiazide (MICROZIDE) 12.5 MG capsule TAKE ONE CAPSULE BY MOUTH DAILY 04/05/22   [name], MD  hydrocortisone (CORTEF) 5 MG tablet Take 2.5-5 mg by mouth See admin instructions. Take 1 tablet (5 mg) by mouth in the morning (scheduled) & may taken an additional 0.5 tablet (2.5 mg) by mouth in the evening if needed.    [provider]  liraglutide (VICTOZA) 18  MG/3ML SOPN Inject 1.2 mg into the skin every evening.    [provider]  methocarbamol (ROBAXIN) 500 MG tablet Take 1 tablet (500 mg total) by mouth every 8 (eight) hours as needed for muscle spasms. Patient not taking: Reported on 03/24/2022 09/08/21   Meredith Pel, MD  metoprolol tartrate (LOPRESSOR) 50 MG tablet Take 0.5 tablets (25 mg total) by mouth 2 (two) times daily. 03/03/22   Richardson Dopp T, PA-C  montelukast (SINGULAIR) 10 MG tablet Take 1 tablet (10 mg total) by mouth at bedtime. 03/15/22   Kozlow, Donnamarie Poag, MD  NEURONTIN 100 MG capsule TAKE TWO CAPSULES BY MOUTH THREE TIMES A DAY 05/10/22   Kozlow, Donnamarie Poag, MD  nitroGLYCERIN (NITROSTAT) 0.4 MG SL tablet Place 1 tablet (0.4 mg total) under the tongue every 5 (five) minutes as needed for chest pain. 02/11/20   Isaiah Serge, NP  nortriptyline (PAMELOR) 10 MG capsule Take 2 capsules (20 mg total) by mouth at bedtime. 04/14/22   Marcial Pacas, MD  pantoprazole (PROTONIX) 40 MG tablet Take 1 tablet (40 mg total) by mouth 2 (two) times daily. 03/15/22 09/11/22  Kozlow, Donnamarie Poag, MD  potassium chloride SA (KLOR-CON) 20 MEQ tablet Take 20 mEq by mouth in the morning. 06/25/20   [provider]  RESTASIS 0.05 % ophthalmic emulsion Place 1 drop into both eyes 2 (two) times daily. 06/24/21   [provider]  sertraline (ZOLOFT) 25 MG tablet TAKE 1 TABLET (25 MG TOTAL) BY MOUTH DAILY 04/05/22 12/31/22  [name], MD  sodium chloride (OCEAN) 0.65 % SOLN nasal spray PLACE 1 SPRAY INTO BOTH NOSTRILS AS NEEDED FOR CONGESTION. 05/11/18   [name], DO  SUMAtriptan (IMITREX) 25 MG tablet Take 1 tablet (25 mg total) by mouth every 2 (two) hours as needed for migraine. May repeat in 2 hours if headache persists or recurs. Patient not taking: Reported on 03/24/2022 02/15/21   Marcial Pacas, MD  [facility] 160-4.5 MCG/ACT inhaler Use 2 puffs 1-2 times a day 03/15/22   Jiles Prows, MD  triamcinolone (NASACORT) 55  MCG/ACT AERO nasal inhaler Place 1 spray into the nose 2 (two) times daily. 1 spray each nostril 2 times per day 09/07/21   Jiles Prows, MD      Allergies    Acetaminophen, Morphine sulfate, Triamterene, Aspirin-caffeine, Dyazide [hydrochlorothiazide w-triamterene], Jardiance [empagliflozin], Lisinopril, Metformin hcl er, Topamax [topiramate], Citalopram, Emtricitabine-tenofovir df, Losartan, and Triamterene-hctz    Review of Systems   Review of Systems  Cardiovascular:  Positive for chest pain.    Physical Exam Updated Vital Signs BP (!) 143/86   Pulse 83   Temp 98 F (36.7 C)   Resp 19   SpO2 99%  Physical Exam Vitals and nursing note reviewed.  Constitutional:      General: She is not in acute distress.    Appearance: She is well-developed.  HENT:     Head:  Normocephalic and atraumatic.  Eyes:     Conjunctiva/sclera: Conjunctivae normal.  Cardiovascular:     Rate and Rhythm: Regular rhythm. Tachycardia present.     Heart sounds: Normal heart sounds. No murmur heard. Pulmonary:     Effort: Tachypnea present. No respiratory distress.     Breath sounds: No stridor. Examination of the right-lower field reveals decreased breath sounds. Examination of the left-lower field reveals decreased breath sounds. Decreased breath sounds present. No wheezing, rhonchi or rales.  Abdominal:     Palpations: Abdomen is soft.     Tenderness: There is no abdominal tenderness.  Musculoskeletal:        General: No swelling.     Cervical back: Neck supple.  Skin:    General: Skin is warm and dry.     Capillary Refill: Capillary refill takes less than 2 seconds.  Neurological:     General: No focal deficit present.     Mental Status: She is alert.  Psychiatric:        Mood and Affect: Mood normal.     ED Results / Procedures / Treatments   Labs (all labs ordered are listed, but only abnormal results are displayed) Labs Reviewed  BASIC METABOLIC PANEL - Abnormal; Notable for the  following components:      Result Value   Potassium 3.3 (*)    Glucose, Bld 139 (*)    Calcium 8.6 (*)    All other components within normal limits  CBC - Abnormal; Notable for the following components:   RBC 5.53 (*)    Hemoglobin 15.3 (*)    All other components within normal limits  RESP PANEL BY RT-PCR (RSV, FLU A&B, COVID)  [id number]  D-DIMER, QUANTITATIVE  BRAIN NATRIURETIC PEPTIDE  TROPONIN I (HIGH SENSITIVITY)  TROPONIN I (HIGH SENSITIVITY)    EKG None  Radiology DG Chest Port 1 View  Result Date: 06/02/2022 CLINICAL DATA:  sob, chest pain EXAM: PORTABLE CHEST - 1 VIEW COMPARISON:  07/17/2020 FINDINGS: Cardiac silhouette is unremarkable. No pneumothorax or pleural effusion. The lungs are clear. The visualized skeletal structures are unremarkable. IMPRESSION: No acute cardiopulmonary process. Electronically Signed   By: Sammie Bench M.D.   On: 06/02/2022 12:04    Procedures Procedures    Medications Ordered in ED Medications  lidocaine (LIDODERM) 5 % 1 patch (1 patch Transdermal Patch Applied 06/02/22 1400)  ipratropium-albuterol (DUONEB) 0.5-2.5 (3) MG/3ML nebulizer solution 3 mL (3 mLs Nebulization Given 06/02/22 1013)  fentaNYL (SUBLIMAZE) injection 25 mcg (25 mcg Intravenous Given 06/02/22 1310)  sodium chloride 0.9 % bolus 500 mL (0 mLs Intravenous Stopped 06/02/22 1336)  potassium chloride SA (KLOR-CON M) CR tablet 20 mEq (20 mEq Oral Given 06/02/22 1359)  predniSONE (DELTASONE) tablet 40 mg (40 mg Oral Given 06/02/22 1359)    ED Course/ Medical Decision Making/ A&P Clinical Course as of 06/02/22 1732  Thu Jun 02, 2022  1215 CBC(!) [CB]    Clinical Course User Index [CB] Gwenevere Abbot, [place]             HEART Score: 3                Medical Decision Making This patient presents to the ED for concern of chest pain and shortness of breath, this involves an extensive number of treatment options, and is a complaint that carries with it a high risk of complications  and morbidity.  The differential diagnosis includes pneumonia, pulmonary embolism, CHF exacerbation, bronchitis, asthma exacerbation, other  Co morbidities that complicate the patient evaluation  Asthma, high cholesterol, HIV   Additional history obtained:  Additional history obtained from EMR External records from outside source obtained and reviewed including outpatient notes   Lab Tests:  I Ordered, and personally interpreted labs.  The pertinent results include: Reassuring CBC, BMP, troponin and D-dimer negative.   Imaging Studies ordered:  I ordered imaging studies including his x-ray I independently visualized and interpreted imaging which showed no pulmonary edema or infiltrates no other acute cardiopulmonary findings I agree with the radiologist interpretation   Cardiac Monitoring: / EKG:  The patient was maintained on a cardiac monitor.  I personally viewed and interpreted the cardiac monitored which showed an underlying rhythm of: Sinus rhythm     Problem List / ED Course / Critical interventions / Medication management  Chest pain or shortness of breath-patient has been sick for over a week with URI symptoms and was going to her PCP today due to persistence of symptoms but she suddenly had chest pain and shortness of breath while getting ready and came to the ED for further evaluation.  She is low risk on Wells score, she has negative D-dimer ruling out pulmonary embolism.  Her heart score is low risk. She is feeling better after some pain medication and a nebulizer machine.  Discussed with her that continued cough may be secondary to bronchospasm given her history of asthma.  Encouraged to continue her inhalers and started on steroids and advised on close outpatient follow-up.  She is given strict return precautions.  I ordered medication including fentanyl for pain Reevaluation of the patient after these medicines showed that the patient improved I have reviewed  the patients home medicines and have made adjustments as needed       Amount and/or Complexity of Data Reviewed Labs: ordered. Decision-making details documented in ED Course. Radiology: ordered.  Risk Prescription drug management.           Final Clinical Impression(s) / ED Diagnoses Final diagnoses:  Chest pain, unspecified type  Bronchitis    Rx / DC Orders ED Discharge Orders          Ordered    predniSONE (DELTASONE) 10 MG tablet  Daily        06/02/22 1352              Darci Current 06/02/22 1733    Davonna Belling, MD 06/03/22 [phone]

## 2022-06-03 ENCOUNTER — Encounter: Payer: Self-pay | Admitting: Obstetrics and Gynecology

## 2022-06-03 ENCOUNTER — Other Ambulatory Visit: Payer: Medicaid Other | Admitting: Obstetrics and Gynecology

## 2022-06-03 NOTE — Patient Instructions (Signed)
Hi Ms. Fralix, thanks for speaking with me-I hope you feel better soon!  Ms. Duran was given information about Medicaid Managed Care team care coordination services as a part of their [place] Medicaid benefit. [name] verbally consented to engagement with the [facility] Managed Care team.   If you are experiencing a medical emergency, please call 911 or report to your local emergency department or urgent care.   If you have a non-emergency medical problem during routine business hours, please contact your provider's office and ask to speak with a nurse.   For questions related to your [facility], please call: [phone] or visit the homepage here: [email or web address]  If you would like to schedule transportation through your [facility], please call the following number at least 2 days in advance of your appointment: [phone]   Rides for urgent appointments can also be made after hours by calling Member Services.  Call the [facility] at [phone], at any time, 24 hours a day, 7 days a week. If you are in danger or need immediate medical attention call 911.  If you would like help to quit smoking, call 1-800-QUIT-NOW 916-278-8534) OR Espaol: 1-855-Djelo-Ya HD:1601594) o para ms informacin haga clic aqu or Text READY to 200-400 to register via text  Ms. Pegg - following are the goals we discussed in your visit today:   Goals Addressed             This Visit's Progress    Protect My Health       Timeframe:  Long-Range Goal Priority:  Medium Start Date:      05/07/20                       Expected End Date: ongoing             Follow Up Date: 07/04/22 - schedule appointment for vaccines needed due to my age or health - schedule recommended health tests (blood work, mammogram, colonoscopy, pap test) - schedule and  keep appointment for annual check-up    Why is this important?   Screening tests can find diseases early when they are easier to treat.  Your doctor or nurse will talk with you about which tests are important for you.  Getting shots for common diseases like the flu and shingles will help prevent them.   06/02/22:  patient has upcoming appts with NEURO and PCP   Patient verbalizes understanding of instructions and care plan provided today and agrees to view in [place]. Active MyChart status and patient understanding of how to access instructions and care plan via MyChart confirmed with patient.     The Managed Medicaid care management team will reach out to the patient again over the next 30 business  days.  The  Patient  has been provided with contact information for the Managed Medicaid care management team and has been advised to call with any health related questions or concerns.   Aida Raider RN, BSN [place] Management Coordinator - Managed Medicaid High Risk [phone]   Following is a copy of your plan of care:  Care Plan : General Plan of Care (Adult)  Updates made by Gayla Medicus, RN since 06/03/2022 12:00 AM     Problem: Health Promotion or Disease Self-Management (General Plan of Care)   Priority: Medium  Onset Date: 08/10/2020     [facility]  Goal: Self-Management Plan Developed   Start Date: 05/07/2020  Expected End Date: 09/01/2022  Recent Progress: On track  Priority: Medium  Note:   Current Barriers:  Chronic Disease Management support and education needs related to DM, HTN, asthma, GERD, anxiety, osteoarthritis, HLD, HIV 06/03/22:  patient seen in ED yesterday-? Flu.  Has f/u appts scheduled.  Blood sugar and blood pressure stable.Will schedule f/u with Dr. Marlou Sa about left shoulder-no change.       Nurse Case Manager Clinical Goal(s):  Over the next 30 days, patient will attend all scheduled medical appointments  Interventions:   Inter-disciplinary care team collaboration (see longitudinal plan of care) Evaluation of current treatment plan and patient's adherence to plan as established by provider. Reviewed medications with patient. Collaborated with pharmacy regarding medications. Discussed plans with patient for ongoing care management follow up and provided patient with direct contact information for care management team Reviewed scheduled/upcoming provider appointments. Pharmacy referral for medication review Patient provided with [facility] transportation information. 04/06/22:  followed up with Pharmacy for patient regarding medications  Asthma: (Status:New goal.) Long Term Goal Provided education about and advised patient to utilize infection prevention strategies to reduce risk of respiratory infection Discussed the importance of adequate rest and management of fatigue with Asthma Assessed social determinant of health barriers    Diabetes Interventions:  (Status:  New goal.) Long Term Goal Assessed patient's understanding of A1c goal: <7% Reviewed medications with patient and discussed importance of medication adherence Counseled on importance of regular laboratory monitoring as prescribed Discussed plans with patient for ongoing care management follow up and provided patient with direct contact information for care management team Reviewed scheduled/upcoming provider appointments Review of patient status, including review of consultants reports, relevant laboratory and other test results, and medications completed Assessed social determinant of health barriers Lab Results  Component Value Date   HGBA1C 6.4 (A) 02/18/2022   Hyperlipidemia Interventions:  (Status:  New goal.) Long Term Goal Medication review performed; medication list updated in electronic medical record.  Provider established cholesterol goals reviewed Counseled on importance of regular laboratory monitoring as prescribed Assessed social  determinant of health barriers   Hypertension Interventions:  (Status:  New goal.) Long Term Goal Last practice recorded BP readings:  BP Readings from Last 3 Encounters:  06/02/22 (!) 143/86  03/31/22 (!) 146/88  03/24/22 136/84  Most recent eGFR/CrCl:  Lab Results  Component Value Date   EGFR 73 02/08/2022    No components found for: "CRCL"  Evaluation of current treatment plan related to hypertension self management and patient's adherence to plan as established by provider Reviewed medications with patient and discussed importance of compliance Discussed plans with patient for ongoing care management follow up and provided patient with direct contact information for care management team Advised patient, providing education and rationale, to monitor blood pressure daily and record, calling PCP for findings outside established parameters Reviewed scheduled/upcoming provider appointments  Assessed social determinant of health barriers   Patient Goals/Self-Care Activities Over the next 30 days, patient will:  -Attends all scheduled provider appointments Calls pharmacy for medication refills Calls provider office for new concerns or questions  Follow Up Plan: The Managed Medicaid care management team will reach out to the patient again over the next 30 business  days.  The patient has been provided with contact information for the Managed Medicaid care management team and has been advised to call with any health related questions or concerns.

## 2022-06-03 NOTE — Patient Outreach (Signed)
Medicaid Managed Care   Nurse Care Manager Note  06/03/2022 Name:  [name] [name] MRN:  [id number] DOB:  1960-08-14  [name] [name] is an 62 y.o. year old female who is a primary patient of [name], MD.  The [facility] Managed Care Coordination team was consulted for assistance with:    Chronic healthcare management needs, DM, HTN, asthma, GERD, anxiety, osteoarthritis, HLD, HIV  [name] [name] was given information about Medicaid Managed Care Coordination team services today. [name] [name] Patient agreed to services and verbal consent obtained.  Engaged with patient by telephone for follow up visit in response to provider referral for case management and/or care coordination services.   Assessments/Interventions:  Review of past medical history, allergies, medications, health status, including review of consultants reports, laboratory and other test data, was performed as part of comprehensive evaluation and provision of chronic care management services.  SDOH (Social Determinants of Health) assessments and interventions performed: SDOH Interventions    Flowsheet Row Patient Outreach Telephone from 06/03/2022 in [place] Patient Outreach Telephone from 05/06/2022 in [place] Patient Outreach Telephone from 04/06/2022 in [place] Patient Outreach Telephone from 04/05/2022 in [place] Patient Outreach Telephone from 03/02/2022 in [place] Patient Outreach Telephone from 01/31/2022 in [place] Coordination  SDOH Interventions        Food Insecurity Interventions -- Intervention Not Indicated -- -- -- --  Housing Interventions -- -- -- -- Intervention Not Indicated --  Transportation Interventions -- -- Other (Comment)  [UHC transportation] -- -- --  Utilities Interventions -- Intervention Not Indicated -- -- --  --  Alcohol Usage Interventions -- -- -- Intervention Not Indicated (Score <7) -- --  Financial Strain Interventions Intervention Not Indicated -- -- -- -- Intervention Not Indicated  Physical Activity Interventions Intervention Not Indicated -- -- -- -- Intervention Not Indicated  Stress Interventions -- -- -- Intervention Not Indicated -- --  Social Connections Interventions -- -- -- -- Intervention Not Indicated --     Care Plan  Allergies  Allergen Reactions   Acetaminophen Other (See Comments)    Inflamed liver, hospitalized     Morphine Sulfate Hives and Shortness Of Breath   Triamterene Hives   Aspirin-Caffeine Other (See Comments)    liver damage; upset stomach   Dyazide [Hydrochlorothiazide W-Triamterene] Hives   Jardiance [Empagliflozin] Diarrhea    (Jardiance) stomach ache    Lisinopril     Cough    Metformin Hcl Er Other (See Comments)     upset stomach    Topamax [Topiramate] Other (See Comments)    Vision disturbances.   Citalopram Itching and Rash   Emtricitabine-Tenofovir Df Rash     Descovy   Losartan Nausea Only and Rash    Pt had rash, worsening dizziness, and nausea after starting losartan, which improved after stopping losartan   Triamterene-Hctz Rash   Medications Reviewed Today     Reviewed by Gayla Medicus, RN (Registered Nurse) on 06/03/22 at [phone]  Med List Status: <None>   Medication Order Taking? Sig Documenting Provider Last Dose Status Informant  albuterol (VENTOLIN HFA) 108 (90 Base) MCG/ACT inhaler [id number] No Inhale 2 puffs into the lungs every 4 (four) hours as needed for wheezing or shortness of breath. Kozlow, Donnamarie Poag, MD Taking Active   amLODipine (NORVASC) 10 MG tablet [id number] No TAKE ONE TABLET BY MOUTH DAILY [name], MD  Taking Active   atorvastatin (LIPITOR) 10 MG tablet [id number] No TAKE HALF TABLET BY MOUTH DAILY Katsadouros, Vasilios, MD Taking Active Self  bictegravir-emtricitabine-tenofovir AF (BIKTARVY) 50-200-25  MG TABS tablet [id number]  Take 1 tablet by mouth daily. [place] Callas, NP  Active   buPROPion (WELLBUTRIN XL) 300 MG 24 hr tablet [id number] No TAKE ONE TABLET BY MOUTH DAILY [name], MD Taking Active Self  celecoxib (CELEBREX) 100 MG capsule [id number]  TAKE 1 CAPSULE (100 MG TOTAL) BY MOUTH TWO (TWO) TIMES DAILY. Magnant, Gerrianne Scale, PA-C  Active   cetirizine (ZYRTEC) 10 MG tablet [id number] No Take 1-2 tablets 1-2 times a day for itch Kozlow, Donnamarie Poag, MD Taking Active   chlorhexidine (PERIDEX) 0.12 % solution [id number] No USE AS DIRECTED 15 MLS IN THE MOUTH OR THROAT TWICE A DAY Dixon, Melton Krebs, NP Taking Active   cholecalciferol (VITAMIN D) 25 MCG (1000 UNIT) tablet [id number] No Take 1 tablet (1,000 Units total) by mouth daily. [name], MD Taking Active   ciclopirox (PENLAC) 8 % solution [id number]  APPLY OVER NAIL AND SURROUNDING SKIN AT BEDTIME OVER PREVIOUS COAT. REMOVE AFTER 7DAYS/REPEAT Trula Slade, DPM  Active   cyclobenzaprine (FLEXERIL) 10 MG tablet [id number] No Take 1 tablet (10 mg total) by mouth 3 (three) times daily as needed for muscle spasms.  Patient not taking: Reported on 03/24/2022   Donella Stade, PA-C Not Taking Active Self  diphenoxylate-atropine (LOMOTIL) 2.5-0.025 MG tablet [id number] No Take 1 tablet by mouth 4 (four) times daily as needed for diarrhea or loose stools. Marshall Callas, NP Taking Active Self  famotidine (PEPCID) 40 MG tablet [id number] No Take 1 tablet (40 mg total) by mouth at bedtime. Jiles Prows, MD Taking Active   guaiFENesin-codeine 100-10 MG/5ML syrup [id number]  Take 5 mLs by mouth 3 (three) times daily as needed for cough. Virl Axe, MD  Active   hydrochlorothiazide (MICROZIDE) 12.5 MG capsule [id number]  TAKE ONE CAPSULE BY MOUTH DAILY [name], MD  Active   hydrocortisone (CORTEF) 5 MG tablet [id number] No Take 2.5-5 mg by mouth See admin instructions. Take 1 tablet (5 mg) by mouth in the morning  (scheduled) & may taken an additional 0.5 tablet (2.5 mg) by mouth in the evening if needed. [provider] Taking Active Self           Med Note Jodi Mourning, Tillie Fantasia Mar 24, 2022  2:57 PM) 2.5 mg once daily  liraglutide (VICTOZA) 18 MG/3ML SOPN [id number] No Inject 1.2 mg into the skin every evening. [provider] Taking Active Self  methocarbamol (ROBAXIN) 500 MG tablet [id number] No Take 1 tablet (500 mg total) by mouth every 8 (eight) hours as needed for muscle spasms.  Patient not taking: Reported on 03/24/2022   Meredith Pel, MD Not Taking Active   metoprolol tartrate (LOPRESSOR) 50 MG tablet [id number] No Take 0.5 tablets (25 mg total) by mouth 2 (two) times daily. Richardson Dopp T, PA-C Taking Active   montelukast (SINGULAIR) 10 MG tablet [id number] No Take 1 tablet (10 mg total) by mouth at bedtime. Jiles Prows, MD Taking Active   NEURONTIN 100 MG capsule [id number]  TAKE TWO CAPSULES BY MOUTH THREE TIMES A DAY Kozlow, Donnamarie Poag, MD  Active   nitroGLYCERIN (NITROSTAT) 0.4 MG SL tablet [id number] No Place 1 tablet (0.4 mg total) under the tongue every 5 (five) minutes as needed for chest pain. Isaiah Serge, NP Taking Active Self  nortriptyline (PAMELOR) 10 MG capsule [id number]  Take 2 capsules (20 mg total) by mouth at bedtime. Marcial Pacas, MD  Active   pantoprazole (PROTONIX) 40 MG tablet [id number] No Take 1 tablet (40 mg total) by mouth 2 (two) times daily. Kozlow, Donnamarie Poag, MD Taking Active   potassium chloride SA (KLOR-CON) 20 MEQ tablet [id number] No Take 20 mEq by mouth in the morning. [provider] Taking Active Self  predniSONE (DELTASONE) 10 MG tablet [id number]  Take 4 tablets (40 mg total) by mouth daily for 4 days. Sherrye Payor A, PA-C  Active   RESTASIS 0.05 % ophthalmic emulsion [id number] No Place 1 drop into both eyes 2 (two) times daily. [provider] Taking Active Self  sertraline (ZOLOFT) 25 MG tablet [id number]  TAKE 1 TABLET  (25 MG TOTAL) BY MOUTH DAILY [name], MD  Active   sodium chloride (OCEAN) 0.65 % SOLN nasal spray [id number] No PLACE 1 SPRAY INTO BOTH NOSTRILS AS NEEDED FOR CONGESTION. [name], DO Taking Active Self  SUMAtriptan (IMITREX) 25 MG tablet [id number] No Take 1 tablet (25 mg total) by mouth every 2 (two) hours as needed for migraine. May repeat in 2 hours if headache persists or recurs.  Patient not taking: Reported on 03/24/2022   Marcial Pacas, MD Not Taking Active Self  SYMBICORT 160-4.5 MCG/ACT inhaler [id number] No Use 2 puffs 1-2 times a day Kozlow, Donnamarie Poag, MD Taking Active   triamcinolone (NASACORT) 55 MCG/ACT AERO nasal inhaler [id number] No Place 1 spray into the nose 2 (two) times daily. 1 spray each nostril 2 times per day Jiles Prows, MD Taking Active            Patient Active Problem List   Diagnosis Date Noted   Viral URI with cough 05/30/2022   Osteoporosis 03/15/2022   Anxiety 03/15/2022   Prediabetes 02/18/2022   Synovitis of left shoulder    Impingement syndrome of left shoulder 07/01/2021   Grief reaction 06/08/2020   Generalized anxiety disorder with panic attacks 06/20/2019   Osteoarthritis of thumb, right 11/29/2018   Chronic migraine w/o aura w/o status migrainosus, not intractable 08/27/2018   Disorder of left eustachian tube 05/19/2017   Vertigo of central origin 01/23/2017   Eustachian tube dysfunction, bilateral 06/15/2016   Ear pain, right 06/15/2016   Spondylosis of lumbar region without myelopathy or radiculopathy 10/08/2015   BPPV (benign paroxysmal positional vertigo) 10/02/2015   Liver fibrosis ([place]) 12/04/2014   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   Vitamin D deficiency 06/29/2014   Secondary adrenal insufficiency ([place]) 06/29/2014   History of tympanostomy tube placement 02/07/2014   Refusal of blood transfusions as patient is Jehovah's Witness 11/18/2013   Hepatitis C  virus infection cured after antiviral drug therapy 03/16/2012   Health care maintenance 12/15/2011   Opioid dependence ([place]) 10/05/2010   HTN (hypertension) 10/05/2010   Hyperlipidemia associated with type 2 diabetes mellitus ([place]) 10/23/2008   HIV disease ([place]) 05/09/2006   Depression 05/09/2006   Allergic rhinitis 05/09/2006   GERD 05/09/2006   Conditions to be addressed/monitored per PCP order:  Chronic healthcare management needs, DM, HTN, asthma, GERD, anxiety, osteoarthritis, HLD, HIV  Care Plan : General Plan of Care (Adult)  Updates made by Gayla Medicus, RN since 06/03/2022 12:00 AM     Problem: Health Promotion or Disease Self-Management (General Plan of Care)   Priority: Medium  Onset Date: 08/10/2020     Long-Range Goal: Self-Management  Plan Developed   Start Date: 05/07/2020  Expected End Date: 09/01/2022  Recent Progress: On track  Priority: Medium  Note:   Current Barriers:  Chronic Disease Management support and education needs related to DM, HTN, asthma, GERD, anxiety, osteoarthritis, HLD, HIV 06/03/22:  patient seen in ED yesterday-? Flu.  Has f/u appts scheduled.  Blood sugar and blood pressure stable.Will schedule f/u with Dr. Marlou Sa about left shoulder-no change.       Nurse Case Manager Clinical Goal(s):  Over the next 30 days, patient will attend all scheduled medical appointments  Interventions:  Inter-disciplinary care team collaboration (see longitudinal plan of care) Evaluation of current treatment plan and patient's adherence to plan as established by provider. Reviewed medications with patient. Collaborated with pharmacy regarding medications. Discussed plans with patient for ongoing care management follow up and provided patient with direct contact information for care management team Reviewed scheduled/upcoming provider appointments. Pharmacy referral for medication review Patient provided with [facility] transportation information. 04/06/22:  followed up  with Pharmacy for patient regarding medications  Asthma: (Status:New goal.) Long Term Goal Provided education about and advised patient to utilize infection prevention strategies to reduce risk of respiratory infection Discussed the importance of adequate rest and management of fatigue with Asthma Assessed social determinant of health barriers    Diabetes Interventions:  (Status:  New goal.) Long Term Goal Assessed patient's understanding of A1c goal: <7% Reviewed medications with patient and discussed importance of medication adherence Counseled on importance of regular laboratory monitoring as prescribed Discussed plans with patient for ongoing care management follow up and provided patient with direct contact information for care management team Reviewed scheduled/upcoming provider appointments Review of patient status, including review of consultants reports, relevant laboratory and other test results, and medications completed Assessed social determinant of health barriers Lab Results  Component Value Date   HGBA1C 6.4 (A) 02/18/2022   Hyperlipidemia Interventions:  (Status:  New goal.) Long Term Goal Medication review performed; medication list updated in electronic medical record.  Provider established cholesterol goals reviewed Counseled on importance of regular laboratory monitoring as prescribed Assessed social determinant of health barriers   Hypertension Interventions:  (Status:  New goal.) Long Term Goal Last practice recorded BP readings:  BP Readings from Last 3 Encounters:  06/02/22 (!) 143/86  03/31/22 (!) 146/88  03/24/22 136/84  Most recent eGFR/CrCl:  Lab Results  Component Value Date   EGFR 73 02/08/2022    No components found for: "CRCL"  Evaluation of current treatment plan related to hypertension self management and patient's adherence to plan as established by provider Reviewed medications with patient and discussed importance of compliance Discussed  plans with patient for ongoing care management follow up and provided patient with direct contact information for care management team Advised patient, providing education and rationale, to monitor blood pressure daily and record, calling PCP for findings outside established parameters Reviewed scheduled/upcoming provider appointments  Assessed social determinant of health barriers   Patient Goals/Self-Care Activities Over the next 30 days, patient will:  -Attends all scheduled provider appointments Calls pharmacy for medication refills Calls provider office for new concerns or questions  Follow Up Plan: The Managed Medicaid care management team will reach out to the patient again over the next 30 business  days.  The patient has been provided with contact information for the Managed Medicaid care management team and has been advised to call with any health related questions or concerns.    Follow Up:  Patient agrees to Care Plan  and Follow-up.  Plan: The Managed Medicaid care management team will reach out to the patient again over the next 30 business  days. and The  Patient has been provided with contact information for the Managed Medicaid care management team and has been advised to call with any health related questions or concerns.  Date/time of next scheduled RN care management/care coordination outreach:  07/04/22 at 0900.

## 2022-06-06 ENCOUNTER — Other Ambulatory Visit: Payer: Self-pay | Admitting: Allergy and Immunology

## 2022-06-21 ENCOUNTER — Encounter: Payer: Self-pay | Admitting: Neurology

## 2022-06-21 ENCOUNTER — Ambulatory Visit: Payer: Medicaid Other | Admitting: Neurology

## 2022-06-21 VITALS — BP 152/86 | HR 85 | Ht 61.0 in | Wt 172.5 lb

## 2022-06-21 DIAGNOSIS — F411 Generalized anxiety disorder: Secondary | ICD-10-CM

## 2022-06-21 DIAGNOSIS — G43709 Chronic migraine without aura, not intractable, without status migrainosus: Secondary | ICD-10-CM

## 2022-06-21 DIAGNOSIS — D352 Benign neoplasm of pituitary gland: Secondary | ICD-10-CM | POA: Diagnosis not present

## 2022-06-21 DIAGNOSIS — F410 Panic disorder [episodic paroxysmal anxiety] without agoraphobia: Secondary | ICD-10-CM | POA: Diagnosis not present

## 2022-06-21 MED ORDER — NORTRIPTYLINE HCL 10 MG PO CAPS: 20.0000 mg | ORAL_CAPSULE | Freq: Every day | ORAL | 3 refills | Status: DC

## 2022-06-21 NOTE — Progress Notes (Signed)
ASSESSMENT AND PLAN 62 y.o. year old female :  Chronic daily headaches Pituitary microadenoma -Headaches well controlled -Could not tolerate Topamax due to worsening glaucoma nortriptyline 20 mg at bedtime works well for her migraine headache -Continue Imitrex 25 mg as needed for acute headache -MRI of the brain in March 2020 showed overall stable pituitary microadenoma, no change from 2018,  Continue follow-up with primary care physician only return to clinic for new issues   DIAGNOSTIC DATA (LABS, IMAGING, TESTING) - I reviewed patient records, labs, notes, testing and imaging myself where available.  HISTORY OF PRESENT ILLNESS:  [name] [name] is a 62 year old female, seen in request by her primary care physician Dr. Heber , Elza Rafter, for evaluation of migraine headache, initial evaluation was on June 25, 2018.   I have reviewed and summarized the referring note from the referring physician.  She had a past medical history of hyperlipidemia, allergy.  She has a history of pituitary adenoma, under close supervision of endocrinologist Dr. 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  She also have a history of HIV, hepatitis C that was treated, Addison's disease, on high dose of hydrocortisone treatment, steroid induced diabetes,   She reported history of migraine headache when she was young, her typical migraine are bilateral temporal, retro-orbital area severe pressure headache with light noise sensitivity, nauseous, prefer to lie down in dark quiet room.   In October 2019, she began to have different kind of headaches, this started with dizziness, few things were spinning around, worsening with sudden positional movement, no hearing loss, no  tinnitus, later she developed this constant bilateral temporal frontal area pressure headaches, difficulty focusing, once a week, it would exacerbated to a more severe headache with light noise sensitivity, nauseous, eating better lying down sleep.   09/24/2018 Dr. Krista Blue: She started Topamax '50mg'$  bid few months ago, which has helped her migraine, but it has affected her vision, she saw flash light in her eye, also complains of difficulty focusing, difficulty reading.     I was able to review her ophthalmology evaluation by [facility] eye care dated Sep 14, 2018,   Anatomic narrow angle OU, glaucoma suspected, pituitary adenoma, dry eyes syndrome bilaterally, nuclear sclerosis OU, color age-related cataract OU, severe retinopathy OU, retinoschsis OU, vitreous obesity, macular Drusen OU, dermatochalasis,   Dr. Kathlen Mody think that she has side effect from topamax.  IOP 13/17, no glaucoma change OU   Topamax has helped her migraine, Imitrex 25 mg as needed was helpful to  UPDATE Feb 15 2021: a video enabled telemedicine   She lost her brother in February 2022, went through a lot of stress, complains of increased migraine headache then, now has improved, she continued on nortriptyline 10 mg 2 tablets every night as preventive medication, works well, also help relaxing, put her into sleep, she was put on Wellbutrin by primary care for depression, which has helped, Imitrex 25 mg as needed was helpful, she only use it once every 1 to 2 months   UPDATE Jun 21 2022: She is overall doing very well, only rarely have headache, nortriptyline 10 mg 2 tablets every night as preventive medication works well for her, need refill,   REVIEW OF SYSTEMS: Out of a complete 14  system review of symptoms, the patient complains only of the following symptoms, and all other reviewed systems are negative.  Headache  PHYSICAL EXAM:  PHYSICAL EXAMNIATION:  Gen: NAD, conversant, well nourised, well groomed                      Cardiovascular: Regular rate rhythm, no peripheral edema, warm, nontender. Eyes: Conjunctivae clear without exudates or hemorrhage Neck: Supple, no carotid bruits. Pulmonary: Clear to auscultation bilaterally   NEUROLOGICAL EXAM:  MENTAL STATUS: Speech/cognition: Awake, alert oriented to history taking and casual conversation  CRANIAL NERVES: CN II: Visual fields are full to confrontation.  Pupils are round equal and briskly reactive to light. CN III, IV, VI: extraocular movement are normal. No ptosis. CN V: Facial sensation is intact to pinprick in all 3 divisions bilaterally. Corneal responses are intact.  CN VII: Face is symmetric with normal eye closure and smile. CN VIII: Hearing is normal to casual conversation CN IX, X: Palate elevates symmetrically. Phonation is normal. CN XI: Head turning and shoulder shrug are intact CN XII: Tongue is midline with normal movements and no atrophy.  MOTOR: There is no pronator drift of out-stretched arms. Muscle bulk and tone are normal. Muscle strength is normal.  REFLEXES: Reflexes are 1  and symmetric at the biceps, triceps, knees, and ankles. Plantar responses are flexor.  SENSORY: Intact to light touch, pinprick, positional and vibratory sensation are intact in fingers and toes.  COORDINATION: Rapid alternating movements and fine finger movements are intact. There is no dysmetria on finger-to-nose and heel-knee-shin.    GAIT/STANCE: Posture is normal. Gait is steady with normal steps,      ALLERGIES: Allergies  Allergen Reactions   Acetaminophen Other (See Comments)    Inflamed liver, hospitalized     Morphine Sulfate Hives and Shortness Of Breath   Triamterene Hives   Aspirin-Caffeine Other (See Comments)    liver damage; upset stomach   Dyazide [Hydrochlorothiazide W-Triamterene] Hives   Jardiance [Empagliflozin] Diarrhea    (Jardiance) stomach ache    Lisinopril     Cough    Metformin Hcl Er Other (See Comments)      upset stomach    Topamax [Topiramate] Other (See Comments)    Vision disturbances.   Citalopram Itching and Rash   Emtricitabine-Tenofovir Df Rash     Descovy   Losartan Nausea Only and Rash    Pt had rash, worsening dizziness, and nausea after starting losartan, which improved after stopping losartan   Triamterene-Hctz Rash    HOME MEDICATIONS: Outpatient Medications Prior to Visit  Medication Sig Dispense Refill   albuterol (VENTOLIN HFA) 108 (90 Base) MCG/ACT inhaler INHALE TWO PUFFS BY MOUTH INTO THE LUNGS EVERY 4 HOURS AS NEEDED FOR WHEEZING OR SHORTNESS OF BREATH 18 g 1   amLODipine (NORVASC) 10 MG tablet TAKE ONE TABLET BY MOUTH DAILY 90 tablet 3   atorvastatin (LIPITOR) 10 MG tablet TAKE HALF TABLET BY MOUTH DAILY 90 tablet 10   bictegravir-emtricitabine-tenofovir AF (BIKTARVY) 50-200-25 MG TABS tablet Take 1 tablet by mouth daily. 30 tablet 8   buPROPion (WELLBUTRIN XL) 300 MG 24 hr tablet TAKE ONE TABLET BY MOUTH DAILY 90 tablet 4   celecoxib (CELEBREX) 100 MG capsule TAKE 1 CAPSULE (100 MG TOTAL) BY MOUTH TWO (TWO) TIMES DAILY. 60 capsule 0   cetirizine (ZYRTEC) 10 MG tablet Take 1-2 tablets 1-2 times a day for itch 120 tablet 5   chlorhexidine (PERIDEX) 0.12 % solution USE AS  DIRECTED 15 MLS IN THE MOUTH OR THROAT TWICE A DAY 473 mL 1   cholecalciferol (VITAMIN D) 25 MCG (1000 UNIT) tablet Take 1 tablet (1,000 Units total) by mouth daily. 90 tablet 2   ciclopirox (PENLAC) 8 % solution APPLY OVER NAIL AND SURROUNDING SKIN AT BEDTIME OVER PREVIOUS COAT. REMOVE AFTER 7DAYS/REPEAT 6.6 mL 0   diphenoxylate-atropine (LOMOTIL) 2.5-0.025 MG tablet Take 1 tablet by mouth 4 (four) times daily as needed for diarrhea or loose stools. 30 tablet 0   famotidine (PEPCID) 40 MG tablet Take 1 tablet (40 mg total) by mouth at bedtime. 30 tablet 5   hydrochlorothiazide (MICROZIDE) 12.5 MG capsule TAKE ONE CAPSULE BY MOUTH DAILY 90 capsule 2   hydrocortisone (CORTEF) 5 MG tablet Take 2.5-5 mg  by mouth See admin instructions. Take 1 tablet (5 mg) by mouth in the morning (scheduled) & may taken an additional 0.5 tablet (2.5 mg) by mouth in the evening if needed.     liraglutide (VICTOZA) 18 MG/3ML SOPN Inject 1.2 mg into the skin every evening.     metoprolol tartrate (LOPRESSOR) 50 MG tablet Take 0.5 tablets (25 mg total) by mouth 2 (two) times daily. 90 tablet 3   montelukast (SINGULAIR) 10 MG tablet Take 1 tablet (10 mg total) by mouth at bedtime. 30 tablet 5   NEURONTIN 100 MG capsule TAKE TWO CAPSULES BY MOUTH THREE TIMES A DAY 180 capsule 1   nitroGLYCERIN (NITROSTAT) 0.4 MG SL tablet Place 1 tablet (0.4 mg total) under the tongue every 5 (five) minutes as needed for chest pain. 25 tablet 1   nortriptyline (PAMELOR) 10 MG capsule Take 2 capsules (20 mg total) by mouth at bedtime. 120 capsule 0   pantoprazole (PROTONIX) 40 MG tablet Take 1 tablet (40 mg total) by mouth 2 (two) times daily. 180 tablet 1   potassium chloride SA (KLOR-CON) 20 MEQ tablet Take 20 mEq by mouth in the morning.     RESTASIS 0.05 % ophthalmic emulsion Place 1 drop into both eyes 2 (two) times daily.     sertraline (ZOLOFT) 25 MG tablet TAKE 1 TABLET (25 MG TOTAL) BY MOUTH DAILY 90 tablet 2   sodium chloride (OCEAN) 0.65 % SOLN nasal spray PLACE 1 SPRAY INTO BOTH NOSTRILS AS NEEDED FOR CONGESTION. 3 Bottle 0   SUMAtriptan (IMITREX) 25 MG tablet Take 1 tablet (25 mg total) by mouth every 2 (two) hours as needed for migraine. May repeat in 2 hours if headache persists or recurs. 12 tablet 6   SYMBICORT 160-4.5 MCG/ACT inhaler Use 2 puffs 1-2 times a day 10.2 g 5   triamcinolone (NASACORT) 55 MCG/ACT AERO nasal inhaler Place 1 spray into the nose 2 (two) times daily. 1 spray each nostril 2 times per day 17 g 5   cyclobenzaprine (FLEXERIL) 10 MG tablet Take 1 tablet (10 mg total) by mouth 3 (three) times daily as needed for muscle spasms. (Patient not taking: Reported on 03/24/2022) 30 tablet 0   guaiFENesin-codeine  100-10 MG/5ML syrup Take 5 mLs by mouth 3 (three) times daily as needed for cough. 120 mL 0   methocarbamol (ROBAXIN) 500 MG tablet Take 1 tablet (500 mg total) by mouth every 8 (eight) hours as needed for muscle spasms. (Patient not taking: Reported on 03/24/2022) 30 tablet 0   No facility-administered medications prior to visit.    PAST MEDICAL HISTORY: Past Medical History:  Diagnosis Date   Allergic rhinitis 05/09/2006   Allergy    Anxiety  Arthritis    Asthma    CHF (congestive heart failure) (HCC)    Chronic back pain    Diabetes mellitus without complication (HCC)    GERD (gastroesophageal reflux disease)    Heart murmur    as a child   Hepatitis C    genotype 1b, 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 s/p 12 week course of simeprevir and sofosbuvir between October 2014 and January 2015 with resolution.   History of shingles    HIV infection ([place])    1994   Hyperlipidemia    no meds taken now   Hypertension    Migraine    Pituitary microadenoma ([place]) 08/08/2014   Pneumonia    Prediabetes    Refusal of blood transfusions as patient is Jehovah's Witness    Secondary adrenal insufficiency ([place]) 06/29/2014   Urticaria     PAST SURGICAL HISTORY: Past Surgical History:  Procedure Laterality Date   BUNIONECTOMY     b/l   COLECTOMY     2003 for diverticulitis, had colostomy bag and then reversed   COLONOSCOPY     HAND SURGERY     HAND SURGERY Right    right thumb surgery Dr Burney Gauze 2021   NASAL SINUS SURGERY     SHOULDER ARTHROSCOPY WITH OPEN ROTATOR CUFF REPAIR AND DISTAL CLAVICLE ACROMINECTOMY Left 08/31/2021   Procedure: LEFT SHOULDER ARTHROSCOPY, DEBRIDEMENT,  ROTATOR CUFF TEAR REPAIR;  Surgeon: Meredith Pel, MD;  Location: [place];  Service: Orthopedics;  Laterality: Left;   SHOULDER SURGERY     left   TONSILLECTOMY      FAMILY HISTORY: Family History  Problem Relation Age of Onset   Heart disease Mother    Diabetes Mother    Stroke  Mother    Heart disease Father    Stroke Father    Diabetes Father    Hepatitis Sister        hcv   Asthma Sister    Allergic rhinitis Sister    Stroke Other    Colon polyps Brother    Renal Disease Brother    Cancer Sister        lung   Asthma Sister    Allergic rhinitis Sister    Cancer Maternal Aunt    Cancer Maternal Aunt    Colon cancer Neg Hx    Esophageal cancer Neg Hx    Stomach cancer Neg Hx    Rectal cancer Neg Hx    Angioedema Neg Hx    Eczema Neg Hx    Urticaria Neg Hx     SOCIAL HISTORY: Social History   Socioeconomic History   Marital status: Single    Spouse name: Not on file   Number of children: 0   Years of education: 12   Highest education level: High school graduate  Occupational History   Occupation: Disabled  Tobacco Use   Smoking status: Former    Years: 20.00    Types: Cigarettes    Quit date: 04/26/2007    Years since quitting: 15.1   Smokeless tobacco: Never   Tobacco comments:    QUIT 2009  Vaping Use   Vaping Use: Never used  Substance and Sexual Activity   Alcohol use: No    Alcohol/week: 0.0 standard drinks of alcohol   Drug use: Not Currently    Types: Marijuana    Comment: cannabis in the past   Sexual activity: Not Currently    Partners: Male    Birth control/protection:  Condom    Comment: declined condoms  Other Topics Concern   Not on file  Social History Narrative   Lives at home alone.   Right-handed.   Occasional tea or soda.   Te   Social Determinants of Health   Financial Resource Strain: Low Risk  (06/03/2022)   Overall Financial Resource Strain (CARDIA)    Difficulty of Paying Living Expenses: Not very hard  Food Insecurity: No Food Insecurity (05/06/2022)   Hunger Vital Sign    Worried About Running Out of Food in the Last Year: Never true    Ran Out of Food in the Last Year: Never true  Transportation Needs: Unmet Transportation Needs (04/06/2022)   PRAPARE - Hydrologist  (Medical): Yes    Lack of Transportation (Non-Medical): No  Physical Activity: Sufficiently Active (06/03/2022)   Exercise Vital Sign    Days of Exercise per Week: 7 days    Minutes of Exercise per Session: 30 min  Stress: No Stress Concern Present (04/05/2022)   [place]    Feeling of Stress : Only a little  Social Connections: Moderately Integrated (03/02/2022)   Social Connection and Isolation Panel [NHANES]    Frequency of Communication with Friends and Family: More than three times a week    Frequency of Social Gatherings with Friends and Family: More than three times a week    Attends Religious Services: More than 4 times per year    Active Member of [employer] or Organizations: Yes    Attends Archivist Meetings: More than 4 times per year    Marital Status: Widowed  Intimate Partner Violence: Not At Risk (04/06/2022)   Humiliation, Afraid, Rape, and Kick questionnaire    Fear of Current or Ex-Partner: No    Emotionally Abused: No    Physically Abused: No    Sexually Abused: No     Marcial Pacas, M.D. Ph.D.  [facility] [place], [place] [address] Phone: [phone] Fax:      [phone]

## 2022-06-22 ENCOUNTER — Telehealth: Payer: Self-pay

## 2022-06-22 NOTE — Telephone Encounter (Signed)
Requesting an increased on sertraline (ZOLOFT) 25 MG tablet, please call pt back.

## 2022-06-22 NOTE — Telephone Encounter (Signed)
Return pt's call - she stated she's under a lot of stress (family) and asking if Sertraline can be increased. She has an appt w/Dr Collene Gobble on 06/27/22.

## 2022-06-22 NOTE — Telephone Encounter (Signed)
Pt called / informed to increase Sertraline to 50 mg per Dr Collene Gobble and will be discuss further at her appt on 3/4. Stated she understands; she will call back if she does not have enough medication to last until her appt.

## 2022-06-23 ENCOUNTER — Ambulatory Visit: Payer: Medicaid Other | Admitting: Orthopedic Surgery

## 2022-06-27 ENCOUNTER — Ambulatory Visit: Payer: Medicaid Other | Admitting: Student

## 2022-06-27 ENCOUNTER — Encounter: Payer: Self-pay | Admitting: Student

## 2022-06-27 ENCOUNTER — Other Ambulatory Visit: Payer: Self-pay

## 2022-06-27 VITALS — BP 141/75 | HR 83 | Temp 98.0°F | Ht 61.0 in | Wt 174.8 lb

## 2022-06-27 DIAGNOSIS — I10 Essential (primary) hypertension: Secondary | ICD-10-CM | POA: Diagnosis not present

## 2022-06-27 DIAGNOSIS — E099 Drug or chemical induced diabetes mellitus without complications: Secondary | ICD-10-CM

## 2022-06-27 DIAGNOSIS — T380X5D Adverse effect of glucocorticoids and synthetic analogues, subsequent encounter: Secondary | ICD-10-CM

## 2022-06-27 DIAGNOSIS — E119 Type 2 diabetes mellitus without complications: Secondary | ICD-10-CM

## 2022-06-27 DIAGNOSIS — F419 Anxiety disorder, unspecified: Secondary | ICD-10-CM

## 2022-06-27 DIAGNOSIS — F410 Panic disorder [episodic paroxysmal anxiety] without agoraphobia: Secondary | ICD-10-CM

## 2022-06-27 DIAGNOSIS — F411 Generalized anxiety disorder: Secondary | ICD-10-CM | POA: Diagnosis not present

## 2022-06-27 MED ORDER — HYDROCHLOROTHIAZIDE 25 MG PO TABS: 25.0000 mg | ORAL_TABLET | Freq: Every day | ORAL | 0 refills | Status: DC

## 2022-06-27 MED ORDER — SERTRALINE HCL 50 MG PO TABS: 50.0000 mg | ORAL_TABLET | Freq: Every day | ORAL | 0 refills | Status: DC

## 2022-06-27 NOTE — Patient Instructions (Signed)
Ms.Jowanna L Danker, it was a pleasure seeing you today!  Today we discussed: - Blood pressure: I am going to increase your blood pressure medication to '25mg'$  daily.  - Please return in one month to re-check your blood pressure.  - Come back in May/June and I will see you again to check your diabetes (my last month is in June).  - Your new doctor after June will be Dr. Simeon Craft.  I have ordered the following labs today:   Lab Orders         Microalbumin / Creatinine Urine Ratio         BMP8+Anion Gap      Ordered the following medication/changed the following medications:   Start the following medications: Meds ordered this encounter  Medications   sertraline (ZOLOFT) 50 MG tablet    Sig: Take 1 tablet (50 mg total) by mouth daily.    Dispense:  90 tablet    Refill:  0   hydrochlorothiazide (HYDRODIURIL) 25 MG tablet    Sig: Take 1 tablet (25 mg total) by mouth daily.    Dispense:  90 tablet    Refill:  0     Follow-up:  1 month    Please make sure to arrive 15 minutes prior to your next appointment. If you arrive late, you may be asked to reschedule.   We look forward to seeing you next time. Please call our clinic at [phone] if you have any questions or concerns. The best time to call is Monday-Friday from 9am-4pm, but there is someone available 24/7. If after hours or the weekend, call the main hospital number and ask for the Internal Medicine Resident On-Call. If you need medication refills, please notify your pharmacy one week in advance and they will send [place] a request.  Thank you for letting us take part in your care. Wishing you the best!  Thank you, [name], MD

## 2022-06-27 NOTE — Progress Notes (Signed)
   CC: follow-up  HPI:  [name] [name] is a 62 y.o. person with medical history as below presenting to [facility] for follow-up  Please see problem-based list for further details, assessments, and plans.  Past Medical History:  Diagnosis Date   Allergic rhinitis 05/09/2006   Allergy    Anxiety    Arthritis    Asthma    CHF (congestive heart failure) (HCC)    Chronic back pain    Diabetes mellitus without complication (HCC)    GERD (gastroesophageal reflux disease)    Heart murmur    as a child   Hepatitis C    genotype 1b, 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 s/p 12 week course of simeprevir and sofosbuvir between October 2014 and January 2015 with resolution.   History of shingles    HIV infection ([place])    1994   Hyperlipidemia    no meds taken now   Hypertension    Migraine    Pituitary microadenoma ([place]) 08/08/2014   Pneumonia    Prediabetes    Refusal of blood transfusions as patient is Jehovah's Witness    Secondary adrenal insufficiency ([place]) 06/29/2014   Urticaria    Review of Systems:  As per HPI  Physical Exam:  Vitals:   06/27/22 0934 06/27/22 1017  BP: (!) 141/78 (!) 141/75  Pulse: 79 83  Temp: 98 F (36.7 C)   TempSrc: Oral   SpO2: 100% 97%  Weight: 174 lb 12.8 oz (79.3 kg)   Height: '5\' 1"'$  (1.549 m)    General: Resting comfortably in no acute distress CV: Regular rate, rhythm. No murmurs appreciated. Dorsalis pedis pulses 2+ bilaterally.  Pulm: Normal work of breathing on room air. Clear to auscultation bilaterally.  MSK: Normal bulk, tone. No peripheral edema noted. Skin: Warm, dry. No rashes or lesions. Neuro: Awake, alert, conversing appropriately. Grossly non-focal. Psych: Normal mood, affect, speech.   Assessment & Plan:   HTN (hypertension) BP Readings from Last 3 Encounters:  06/27/22 (!) 141/75  06/21/22 (!) 152/86  06/02/22 (!) 143/86   Blood pressure mildly elevated again in the office. She brought a log of her blood  pressures, consistently 130-140/80-90's. She reports compliance with hydrochlorothiazide, metoprolol, and amlodipine. Since starting HCTZ at the last visit, she denies any adverse effects, has been tolerating this well. As previously noted, she has not tolerated ACEi or ARB's in the past. We will plan to increase the thiazide dosage today and have her return in one month. Of note, she was found to be mildly hypokalemic at her last lab work, although she was not eating well due to an upper respiratory infection.   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 We will obtain urine studies today to check for proteinuria. Otherwise she is to continue following up with her endocrinologist.  - Urine microalbumin/creatinine ratio today - Foot exam today - Follow-up with endocrinology    Patient discussed with Dr. Denita Lung, MD Internal Medicine PGY-3 Pager: [phone]

## 2022-06-27 NOTE — Assessment & Plan Note (Addendum)
She has been doing well with her diabetic regimen. We will obtain urine studies today to check for proteinuria. Otherwise she is to continue following up with her endocrinologist.  - Urine microalbumin/creatinine ratio today - Foot exam today - Follow-up with endocrinology

## 2022-06-27 NOTE — Assessment & Plan Note (Signed)
BP Readings from Last 3 Encounters:  06/27/22 (!) 141/75  06/21/22 (!) 152/86  06/02/22 (!) 143/86   Blood pressure mildly elevated again in the office. She brought a log of her blood pressures, consistently 130-140/80-90's. She reports compliance with hydrochlorothiazide, metoprolol, and amlodipine. Since starting HCTZ at the last visit, she denies any adverse effects, has been tolerating this well. As previously noted, she has not tolerated ACEi or ARB's in the past. We will plan to increase the thiazide dosage today and have her return in one month. Of note, she was found to be mildly hypokalemic at her last lab work, although she was not eating well due to an upper respiratory infection.   - Increase hydrochlorothiazide '25mg'$  daily - Continue amlodipine '10mg'$  daily - Continue metoprolol tartrate '25mg'$  twice daily - BMP today, follow-up in one month

## 2022-06-28 ENCOUNTER — Encounter: Payer: Self-pay | Admitting: Physical Medicine & Rehabilitation

## 2022-06-28 ENCOUNTER — Encounter: Payer: Medicaid Other | Attending: Physical Medicine & Rehabilitation | Admitting: Physical Medicine & Rehabilitation

## 2022-06-28 VITALS — BP 148/90 | HR 77 | Ht 61.0 in | Wt 175.0 lb

## 2022-06-28 DIAGNOSIS — M461 Sacroiliitis, not elsewhere classified: Secondary | ICD-10-CM | POA: Insufficient documentation

## 2022-06-28 MED ORDER — BETAMETHASONE SOD PHOS & ACET 6 (3-3) MG/ML IJ SUSP
12.0000 mg | Freq: Once | INTRAMUSCULAR | Status: AC
  Administered 2022-06-28: 12 mg via INTRAMUSCULAR

## 2022-06-28 MED ORDER — LIDOCAINE HCL 1 % IJ SOLN
5.0000 mL | Freq: Once | INTRAMUSCULAR | Status: AC
  Administered 2022-06-28: 5 mL

## 2022-06-28 MED ORDER — LIDOCAINE HCL (PF) 1 % IJ SOLN
2.0000 mL | Freq: Once | INTRAMUSCULAR | Status: AC
  Administered 2022-06-28: 2 mL

## 2022-06-28 MED ORDER — IOHEXOL 180 MG/ML  SOLN
3.0000 mL | Freq: Once | INTRAMUSCULAR | Status: AC
  Administered 2022-06-28: 3 mL via INTRAVENOUS

## 2022-06-28 MED ORDER — DEXAMETHASONE SODIUM PHOSPHATE 10 MG/ML IJ SOLN: 10.0000 mg | Freq: Once | INTRAMUSCULAR | Status: DC

## 2022-06-28 NOTE — Progress Notes (Signed)
  PROCEDURE RECORD [facility]   Name: [name] [name] DOB:November 30, 1960 MRN: [id number]  Date:06/28/2022  Physician: Alysia Penna, MD    Nurse/CMA: [name] [name] RMA   Allergies:  Allergies  Allergen Reactions   Acetaminophen Other (See Comments)    Inflamed liver, hospitalized     Morphine Sulfate Hives and Shortness Of Breath   Triamterene Hives   Aspirin-Caffeine Other (See Comments)    liver damage; upset stomach   Dyazide [Hydrochlorothiazide W-Triamterene] Hives   Jardiance [Empagliflozin] Diarrhea    (Jardiance) stomach ache    Lisinopril     Cough    Metformin Hcl Er Other (See Comments)     upset stomach    Topamax [Topiramate] Other (See Comments)    Vision disturbances.   Citalopram Itching and Rash   Emtricitabine-Tenofovir Df Rash     Descovy   Losartan Nausea Only and Rash    Pt had rash, worsening dizziness, and nausea after starting losartan, which improved after stopping losartan   Triamterene-Hctz Rash    Consent Signed: Yes.    Is patient diabetic? Yes.    CBG today? 142  Pregnant: No. LMP: No LMP recorded. Patient is postmenopausal. (age 93-55)  Anticoagulants: no Anti-inflammatory: no Antibiotics: no  Procedure: Bilateral Steroid Injections  Position: Prone Start Time: 11:33AM  End Time: 11:40AM  Fluoro Time: 25  RN/CMA Carrye Goller RMA Odysseus Cada RMA     Time 11:22 11:44AM    BP 148/90 121/81    Pulse 77 86    Respirations 16 16    O2 Sat 94 97    S/S 6 6    Pain Level 10/10 8/10     D/C home with Self, [name] & O X 3, D/C instructions reviewed, and sits independently.

## 2022-06-28 NOTE — Progress Notes (Signed)
Bilateral sacroiliac injections under fluoroscopic guidance  Indication: Low back and buttocks pain not relieved by medication management and other conservative care.Has had failure of PT and OTC meds.  Hx HIV, Last CD4 Oct 2023  487 Last injection 03/24/22 lasted ~6wks  Informed consent was obtained after describing risks and benefits of the procedure with the patient, this includes bleeding, bruising, infection, paralysis and medication side effects. The patient wishes to proceed and has given written consent. The patient was placed in a prone position. The lumbar and sacral area was marked and prepped with Betadine. A 25-gauge 1-1/2 inch needle was inserted into the skin and subcutaneous tissue and 1 mL of 1% lidocaine was injected into each side. Then a 25-gauge 3 inch spinal needle was inserted under fluoroscopic guidance into the left sacroiliac joint. AP and lateral images were utilized. Omnipaque 180x0.5 mL under live fluoroscopy demonstrated no intravascular uptake. Then a solution containing one ML of 6 mg per mL Celestone in 2 ML of 2% lidocaine MPF was injected x1.5 mL. This same procedure was repeated on the right side using the same needle, injectate, and technique. Patient tolerated the procedure well. Post procedure instructions were given. Please see post procedure form.

## 2022-06-29 ENCOUNTER — Ambulatory Visit (INDEPENDENT_AMBULATORY_CARE_PROVIDER_SITE_OTHER): Payer: Medicaid Other | Admitting: Orthopedic Surgery

## 2022-06-29 ENCOUNTER — Ambulatory Visit (INDEPENDENT_AMBULATORY_CARE_PROVIDER_SITE_OTHER): Payer: Medicaid Other

## 2022-06-29 DIAGNOSIS — M79602 Pain in left arm: Secondary | ICD-10-CM

## 2022-06-29 LAB — BMP8+ANION GAP
Anion Gap: 18 mmol/L (ref 10.0–18.0)
BUN/Creatinine Ratio: 16 (ref 12–28)
BUN: 14 mg/dL (ref 8–27)
CO2: 24 mmol/L (ref 20–29)
Calcium: 10.1 mg/dL (ref 8.7–10.3)
Chloride: 99 mmol/L (ref 96–106)
Creatinine, Ser: 0.89 mg/dL (ref 0.57–1.00)
Glucose: 153 mg/dL — ABNORMAL HIGH (ref 70–99)
Potassium: 3.8 mmol/L (ref 3.5–5.2)
Sodium: 141 mmol/L (ref 134–144)
eGFR: 74 mL/min/{1.73_m2} (ref >59)

## 2022-06-29 MED ORDER — CELECOXIB 100 MG PO CAPS: 100.0000 mg | ORAL_CAPSULE | Freq: Two times a day (BID) | ORAL | 1 refills | Status: DC

## 2022-06-29 NOTE — Progress Notes (Signed)
Internal Medicine Clinic Attending  Case discussed with the resident at the time of the visit.  We reviewed the resident's history and exam and pertinent patient test results.  I agree with the assessment, diagnosis, and plan of care documented in the resident's note.  

## 2022-06-30 ENCOUNTER — Encounter: Payer: Self-pay | Admitting: Student

## 2022-06-30 LAB — MICROALBUMIN / CREATININE URINE RATIO
Creatinine, Urine: 69.9 mg/dL
Microalb/Creat Ratio: <4 mg/g creat (ref 0–29)
Microalbumin, Urine: <3 ug/mL

## 2022-07-01 ENCOUNTER — Telehealth: Payer: Self-pay | Admitting: Orthopedic Surgery

## 2022-07-01 NOTE — Telephone Encounter (Signed)
Patient called in stating she has extreme claustrophobia and will need something to calm her nerves before she gets her MRI on 04/02 please advise

## 2022-07-02 ENCOUNTER — Encounter: Payer: Self-pay | Admitting: Orthopedic Surgery

## 2022-07-02 NOTE — Progress Notes (Signed)
Office Visit Note   Patient: [name] [name]           Date of Birth: January 22, 1961           MRN: [id number] Visit Date: 06/29/2022 Requested by: [name], MD [address] [place],  [place] [address] PCP: [name], MD  Subjective: Chief Complaint  Patient presents with   Right Shoulder - Pain   Neck - Pain    HPI: [name] [name] is a 62 y.o. female who presents to the office reporting left shoulder pain.  Patient underwent left shoulder arthroscopy in May of last year with rotator cuff repair.  Reports continued pain.  Taking Celebrex daily.  She states my arm never stopped hurting.  Describes decreased range of motion.  States the arm is stiff.  Hard for her to lay on that left-hand side.  She has to wear a sports bra.  The pain wakes her from sleep at night.  Reports anxiety attacks due to the shoulder and has had to increase her anxiety medication.  She is on oral steroids due to Addison's disease.  Has diabetes as a result of the steroid use.  Did have epidural steroid injection yesterday for her back.  She describes pain which radiates from the biceps down into the hand.  Cannot hold the phone for very long in that left hand.  Describes a definite radicular component to her symptoms..                ROS: All systems reviewed are negative as they relate to the chief complaint within the history of present illness.  Patient denies fevers or chills.  Assessment & Plan: Visit Diagnoses:  1. Left arm pain     Plan: Impression is neck versus shoulder source of pain.  She really does not want any more shoulder surgery.  Celebrex refilled.  She has good passive range of motion of the shoulder.  Needs MRI cervical spine to evaluate for left-sided radiculopathy due to longer than 6 months of symptoms in that left shoulder and arm which I think could be radicular in nature.  Follow-up after that study.  Alternatively I may just call her with the results and get her set up for an  injection in her neck depending on any left-sided findings.  Follow-Up Instructions: No follow-ups on file.   Orders:  Orders Placed This Encounter  Procedures   XR Shoulder Left   XR Cervical Spine 2 or 3 views   MR Cervical Spine w/o contrast   Meds ordered this encounter  Medications   celecoxib (CELEBREX) 100 MG capsule    Sig: Take 1 capsule (100 mg total) by mouth 2 (two) times daily.    Dispense:  60 capsule    Refill:  1      Procedures: No procedures performed   Clinical Data: No additional findings.  Objective: Vital Signs: There were no vitals taken for this visit.  Physical Exam:  Constitutional: Patient appears well-developed HEENT:  Head: Normocephalic Eyes:EOM are normal Neck: Normal range of motion Cardiovascular: Normal rate Pulmonary/chest: Effort normal Neurologic: Patient is alert Skin: Skin is warm Psychiatric: Patient has normal mood and affect  Ortho Exam: Ortho exam demonstrates passive range of motion on the left of 70/100/160.  She has fairly reasonable rotator cuff strength infraspinatus supraspinatus and subscap muscle testing but does have some pain with passive range of motion above 90 degrees of abduction.  No paresthesias C5-T1.  Reflexes symmetric.  Radial pulse intact on the left.  Cervical spine range of motion is slightly tender to the left compared to the right.  Specialty Comments:  No specialty comments available.  Imaging: No results found.   PMFS History: Patient Active Problem List   Diagnosis Date Noted   Viral URI with cough 05/30/2022   Osteoporosis 03/15/2022   Anxiety 03/15/2022   Prediabetes 02/18/2022   Synovitis of left shoulder    Impingement syndrome of left shoulder 07/01/2021   Grief reaction 06/08/2020   Generalized anxiety disorder with panic attacks 06/20/2019   Osteoarthritis of thumb, right 11/29/2018   Chronic migraine w/o aura w/o status migrainosus, not intractable 08/27/2018   Disorder of  left eustachian tube 05/19/2017   Vertigo of central origin 01/23/2017   Eustachian tube dysfunction, bilateral 06/15/2016   Ear pain, right 06/15/2016   Spondylosis of lumbar region without myelopathy or radiculopathy 10/08/2015   BPPV (benign paroxysmal positional vertigo) 10/02/2015   Liver fibrosis ([place]) 12/04/2014   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   Vitamin D deficiency 06/29/2014   Secondary adrenal insufficiency ([place]) 06/29/2014   History of tympanostomy tube placement 02/07/2014   Refusal of blood transfusions as patient is Jehovah's Witness 11/18/2013   Hepatitis C virus infection cured after antiviral drug therapy 03/16/2012   Chronic hepatitis C ([place]) 03/16/2012   Health care maintenance 12/15/2011   Opioid dependence ([place]) 10/05/2010   HTN (hypertension) 10/05/2010   Hyperlipidemia associated with type 2 diabetes mellitus ([place]) 10/23/2008   HIV disease ([employer]) 05/09/2006   Depression 05/09/2006   Allergic rhinitis 05/09/2006   GERD 05/09/2006   Past Medical History:  Diagnosis Date   Allergic rhinitis 05/09/2006   Allergy    Anxiety    Arthritis    Asthma    CHF (congestive heart failure) (HCC)    Chronic back pain    Diabetes mellitus without complication (HCC)    GERD (gastroesophageal reflux disease)    Heart murmur    as a child   Hepatitis C    genotype 1b, 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 s/p 12 week course of simeprevir and sofosbuvir between October 2014 and January 2015 with resolution.   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   Pneumonia    Prediabetes    Refusal of blood transfusions as patient is Jehovah's Witness    Secondary adrenal insufficiency ([place]) 06/29/2014   Urticaria     Family History  Problem Relation Age of Onset   Heart disease Mother     Diabetes Mother    Stroke Mother    Heart disease Father    Stroke Father    Diabetes Father    Hepatitis Sister        hcv   Asthma Sister    Allergic rhinitis Sister    Stroke Other    Colon polyps Brother    Renal Disease Brother    Cancer Sister        lung   Asthma Sister    Allergic rhinitis Sister    Cancer Maternal Aunt    Cancer Maternal Aunt    Colon cancer Neg Hx    Esophageal cancer Neg Hx    Stomach cancer Neg Hx    Rectal cancer Neg Hx  Angioedema Neg Hx    Eczema Neg Hx    Urticaria Neg Hx     Past Surgical History:  Procedure Laterality Date   BUNIONECTOMY     b/l   COLECTOMY     2003 for diverticulitis, had colostomy bag and then reversed   COLONOSCOPY     HAND SURGERY     HAND SURGERY Right    right thumb surgery Dr Burney Gauze 2021   NASAL SINUS SURGERY     SHOULDER ARTHROSCOPY WITH OPEN ROTATOR CUFF REPAIR AND DISTAL CLAVICLE ACROMINECTOMY Left 08/31/2021   Procedure: LEFT SHOULDER ARTHROSCOPY, DEBRIDEMENT,  ROTATOR CUFF TEAR REPAIR;  Surgeon: Meredith Pel, MD;  Location: [facility];  Service: Orthopedics;  Laterality: Left;   SHOULDER SURGERY     left   TONSILLECTOMY     Social History   Occupational History   Occupation: Disabled  Tobacco Use   Smoking status: Former    Years: 20.00    Types: Cigarettes    Quit date: 04/26/2007    Years since quitting: 15.1   Smokeless tobacco: Never   Tobacco comments:    QUIT 2009  Vaping Use   Vaping Use: Never used  Substance and Sexual Activity   Alcohol use: No    Alcohol/week: 0.0 standard drinks of alcohol   Drug use: Not Currently    Types: Marijuana    Comment: cannabis in the past   Sexual activity: Not Currently    Partners: Male    Birth control/protection: Condom    Comment: declined condoms

## 2022-07-04 ENCOUNTER — Other Ambulatory Visit: Payer: Medicaid Other | Admitting: Obstetrics and Gynecology

## 2022-07-04 ENCOUNTER — Other Ambulatory Visit: Payer: Self-pay | Admitting: Allergy and Immunology

## 2022-07-04 ENCOUNTER — Encounter: Payer: Self-pay | Admitting: Obstetrics and Gynecology

## 2022-07-04 NOTE — Patient Instructions (Signed)
Hi [name] [name]-I hope you have a great week ahead and thanks for speaking with me this morning!!  [name] [name] was given information about Medicaid Managed Care team care coordination services as a part of their [place] Medicaid benefit. [name] [name] verbally consented to engagement with the [facility] Managed Care team.   If you are experiencing a medical emergency, please call 911 or report to your local emergency department or urgent care.   If you have a non-emergency medical problem during routine business hours, please contact your provider's office and ask to speak with a nurse.   For questions related to your [facility], please call: [phone] or visit the homepage here: [email or web address]  If you would like to schedule transportation through your [facility], please call the following number at least 2 days in advance of your appointment: [phone]   Rides for urgent appointments can also be made after hours by calling Member Services.  Call the [facility] at [phone], at any time, 24 hours a day, 7 days a week. If you are in danger or need immediate medical attention call 911.  If you would like help to quit smoking, call 1-800-QUIT-NOW 5088696288) OR Espaol: 1-855-Djelo-Ya QO:409462) o para ms informacin haga clic aqu or Text READY to 200-400 to register via text  [name] [name] - following are the goals we discussed in your visit today:   Goals Addressed             This Visit's Progress    Protect My Health       Timeframe:  Long-Range Goal Priority:  Medium Start Date:      05/07/20                       Expected End Date: ongoing             Follow Up Date: 09/02/22 - schedule appointment for vaccines needed due to my age or health - schedule recommended health tests (blood work, mammogram, colonoscopy,  pap test) - schedule and keep appointment for annual check-up    Why is this important?   Screening tests can find diseases early when they are easier to treat.  Your doctor or nurse will talk with you about which tests are important for you.  Getting shots for common diseases like the flu and shingles will help prevent them.   07/04/22:  Patient recently seen and evaluated by NEURO, PCP, ORTHO, and pain management, has upcoming ORTHO f/u and MRI   Patient verbalizes understanding of instructions and care plan provided today and agrees to view in [place]. Active MyChart status and patient understanding of how to access instructions and care plan via MyChart confirmed with patient.     The Managed Medicaid care management team will reach out to the patient again over the next 30 business  days.  The  Patient   has been provided with contact information for the Managed Medicaid care management team and has been advised to call with any health related questions or concerns.   Aida Raider RN, BSN [place] Management Coordinator - Managed Medicaid High Risk [phone]   Following is a copy of your plan of care:  Care Plan : General Plan of Care (Adult)  Updates made by Gayla Medicus, RN since 07/04/2022 12:00 AM     Problem: Health Promotion or Disease Self-Management (General  Plan of Care)   Priority: Medium  Onset Date: 08/10/2020     Long-Range Goal: Self-Management Plan Developed   Start Date: 05/07/2020  Expected End Date: 10/04/2022  Recent Progress: On track  Priority: Medium  Note:   Current Barriers:  Chronic Disease Management support and education needs related to DM, HTN, asthma, GERD, anxiety, osteoarthritis, HLD, HIV 07/04/22:  BP stabilized blood sugars stabilized.  Patient's main complaint remains left shoulder and now left hand numbness, to have MRI and f/u with Dr. Marlou Sa.  Taking Zoloft for anxiety/stress.  Nurse Case Manager Clinical  Goal(s):  Over the next 30 days, patient will attend all scheduled medical appointments  Interventions:  Inter-disciplinary care team collaboration (see longitudinal plan of care) Evaluation of current treatment plan and patient's adherence to plan as established by provider. Reviewed medications with patient. Collaborated with pharmacy regarding medications. Discussed plans with patient for ongoing care management follow up and provided patient with direct contact information for care management team Reviewed scheduled/upcoming provider appointments. Pharmacy referral for medication review Patient provided with [facility] transportation information. 04/06/22:  followed up with Pharmacy for patient regarding medications  Asthma: (Status:New goal.) Long Term Goal Provided education about and advised patient to utilize infection prevention strategies to reduce risk of respiratory infection Discussed the importance of adequate rest and management of fatigue with Asthma Assessed social determinant of health barriers   Diabetes Interventions:  (Status:  New goal.) Long Term Goal Assessed patient's understanding of A1c goal: <7% Reviewed medications with patient and discussed importance of medication adherence Counseled on importance of regular laboratory monitoring as prescribed Discussed plans with patient for ongoing care management follow up and provided patient with direct contact information for care management team Reviewed scheduled/upcoming provider appointments Review of patient status, including review of consultants reports, relevant laboratory and other test results, and medications completed Assessed social determinant of health barriers Lab Results  Component Value Date   HGBA1C 6.4 (A) 02/18/2022   Hyperlipidemia Interventions:  (Status:  New goal.) Long Term Goal Medication review performed; medication list updated in electronic medical record.  Provider established cholesterol  goals reviewed Counseled on importance of regular laboratory monitoring as prescribed Assessed social determinant of health barriers   Hypertension Interventions:  (Status:  New goal.) Long Term Goal Last practice recorded BP readings:  BP Readings from Last 3 Encounters:  06/02/22 (!) 143/86  03/31/22 (!) 146/88  03/24/22 136/84  07/03/22        137/80. Most recent eGFR/CrCl:  Lab Results  Component Value Date   EGFR 73 02/08/2022    No components found for: "CRCL"  Evaluation of current treatment plan related to hypertension self management and patient's adherence to plan as established by provider Reviewed medications with patient and discussed importance of compliance Discussed plans with patient for ongoing care management follow up and provided patient with direct contact information for care management team Advised patient, providing education and rationale, to monitor blood pressure daily and record, calling PCP for findings outside established parameters Reviewed scheduled/upcoming provider appointments  Assessed social determinant of health barriers   Patient Goals/Self-Care Activities Over the next 30 days, patient will:  -Attends all scheduled provider appointments Calls pharmacy for medication refills Calls provider office for new concerns or questions  Follow Up Plan: The Managed Medicaid care management team will reach out to the patient again over the next 30 business  days.  The patient has been provided with contact information for the Managed Medicaid care management team  and has been advised to call with any health related questions or concerns.

## 2022-07-04 NOTE — Patient Outreach (Signed)
Medicaid Managed Care   Nurse Care Manager Note  07/04/2022 Name:  [name] [name] MRN:  [id number] DOB:  1960-05-15  [name] [name] is an 62 y.o. year old female who is a primary patient of [name] Dame, [name].  The [facility] Managed Care Coordination team was consulted for assistance with:    Chronic healthcare management needs, DM, HTN, asthma, GERD, anxiety, osteoarthritis, HLD, HIV, Addison's disease, pituitary adenoma  [name] [name] was given information about Medicaid Managed Care Coordination team services today. [name] [name] Patient agreed to services and verbal consent obtained.  Engaged with patient by telephone for follow up visit in response to provider referral for case management and/or care coordination services.   Assessments/Interventions:  Review of past medical history, allergies, medications, health status, including review of consultants reports, laboratory and other test data, was performed as part of comprehensive evaluation and provision of chronic care management services.  SDOH (Social Determinants of Health) assessments and interventions performed: SDOH Interventions    Flowsheet Row Patient Outreach Telephone from 07/04/2022 in [place] Office Visit from 06/27/2022 in [place] Patient Outreach Telephone from 06/03/2022 in [place] Patient Outreach Telephone from 05/06/2022 in [place] Patient Outreach Telephone from 04/06/2022 in [place] Patient Outreach Telephone from 04/05/2022 in [place] Interventions        Food Insecurity Interventions -- Intervention Not Indicated -- Intervention Not Indicated -- --  Housing Interventions -- Intervention Not Indicated -- -- -- --  Transportation Interventions -- Intervention Not Indicated -- -- Other (Comment)  [UHC transportation] --   Utilities Interventions -- Intervention Not Indicated -- Intervention Not Indicated -- --  Alcohol Usage Interventions Intervention Not Indicated (Score <7) -- -- -- -- Intervention Not Indicated (Score <7)  Depression Interventions/Treatment  -- --  [Provider made aware] -- -- -- --  Financial Strain Interventions -- Intervention Not Indicated Intervention Not Indicated -- -- --  Physical Activity Interventions -- -- Intervention Not Indicated -- -- --  Stress Interventions Intervention Not Indicated -- -- -- -- Intervention Not Indicated  Social Connections Interventions -- Intervention Not Indicated -- -- -- --     Care Plan  Allergies  Allergen Reactions   Acetaminophen Other (See Comments)    Inflamed liver, hospitalized     Morphine Sulfate Hives and Shortness Of Breath   Triamterene Hives   Aspirin-Caffeine Other (See Comments)    liver damage; upset stomach   Dyazide [Hydrochlorothiazide W-Triamterene] Hives   Jardiance [Empagliflozin] Diarrhea    (Jardiance) stomach ache    Lisinopril     Cough    Metformin Hcl Er Other (See Comments)     upset stomach    Topamax [Topiramate] Other (See Comments)    Vision disturbances.   Citalopram Itching and Rash   Emtricitabine-Tenofovir Df Rash     Descovy   Losartan Nausea Only and Rash    Pt had rash, worsening dizziness, and nausea after starting losartan, which improved after stopping losartan   Triamterene-Hctz Rash   Medications Reviewed Today     Reviewed by [name] Medicus, RN (Registered Nurse) on 07/04/22 at [phone]  Med List Status: <None>   Medication Order Taking? Sig Documenting Provider Last Dose Status Informant  albuterol (VENTOLIN HFA) 108 (90 Base) MCG/ACT inhaler [id number] No INHALE TWO PUFFS BY MOUTH INTO THE LUNGS EVERY 4 HOURS AS NEEDED FOR WHEEZING OR  SHORTNESS OF BREATH Kozlow, Donnamarie Poag, [name] Taking Active   amLODipine (NORVASC) 10 MG tablet [id number] No TAKE ONE TABLET BY MOUTH DAILY [name] Dame, [name]  Taking Active   atorvastatin (LIPITOR) 10 MG tablet [id number] No TAKE HALF TABLET BY MOUTH DAILY Katsadouros, Vasilios, [name] Taking Active Self  bictegravir-emtricitabine-tenofovir AF (BIKTARVY) 50-200-25 MG TABS tablet [id number] No Take 1 tablet by mouth daily. [name] [name] Taking Active   buPROPion (WELLBUTRIN XL) 300 MG 24 hr tablet [id number] No TAKE ONE TABLET BY MOUTH DAILY [name] Dame, [name] Taking Active Self  celecoxib (CELEBREX) 100 MG capsule [id number] No TAKE 1 CAPSULE (100 MG TOTAL) BY MOUTH TWO (TWO) TIMES DAILY. [name] [name] Taking Active   celecoxib (CELEBREX) 100 MG capsule [id number]  Take 1 capsule (100 mg total) by mouth 2 (two) times daily. [name] [name]  Active   cetirizine (ZYRTEC) 10 MG tablet [id number] No Take 1-2 tablets 1-2 times a day for itch Kozlow, Donnamarie Poag, [name] Taking Active   chlorhexidine (PERIDEX) 0.12 % solution [id number] No USE AS DIRECTED 15 MLS IN THE MOUTH OR THROAT TWICE A DAY Dixon, Melton Krebs, [name] Taking Active   cholecalciferol (VITAMIN D) 25 MCG (1000 UNIT) tablet [id number] No Take 1 tablet (1,000 Units total) by mouth daily. [name] Dame, [name] Taking Active   ciclopirox [facility]) 8 % solution [id number] No APPLY OVER NAIL AND SURROUNDING SKIN AT BEDTIME OVER PREVIOUS COAT. REMOVE AFTER 7DAYS/REPEAT [name] [name] Taking Active   diphenoxylate-atropine (LOMOTIL) 2.5-0.025 MG tablet [id number] No Take 1 tablet by mouth 4 (four) times daily as needed for diarrhea or loose stools. [name] [name] Taking Active Self  famotidine (PEPCID) 40 MG tablet [id number] No Take 1 tablet (40 mg total) by mouth at bedtime. Kozlow, Donnamarie Poag, [name] Taking Active   hydrochlorothiazide (HYDRODIURIL) 25 MG tablet [id number] No Take 1 tablet (25 mg total) by mouth daily. [name] Dame, [name] Taking Active   hydrocortisone (CORTEF) 5 MG tablet [id number] No Take 2.5-5 mg by mouth See admin instructions. Take 1 tablet (5 mg) by mouth in the  morning (scheduled) & may taken an additional 0.5 tablet (2.5 mg) by mouth in the evening if needed. Provider, Historical, [name] Taking Active Self           Med Note [name] [name] Mar 24, 2022  2:57 PM) 2.5 mg once daily  liraglutide (VICTOZA) 18 MG/3ML SOPN [id number] No Inject 1.2 mg into the skin every evening. Provider, Historical, [name] Taking Active Self  metoprolol tartrate (LOPRESSOR) 50 MG tablet [id number] No Take 0.5 tablets (25 mg total) by mouth 2 (two) times daily. Richardson Dopp T, [name] Taking Active   montelukast (SINGULAIR) 10 MG tablet [id number] No Take 1 tablet (10 mg total) by mouth at bedtime. Jiles Prows, [name] Taking Active   NEURONTIN 100 MG capsule [id number] No TAKE TWO CAPSULES BY MOUTH THREE TIMES A DAY Kozlow, Donnamarie Poag, [name] Taking Active   nitroGLYCERIN (NITROSTAT) 0.4 MG SL tablet [id number] No Place 1 tablet (0.4 mg total) under the tongue every 5 (five) minutes as needed for chest pain. Isaiah Serge, [name] Taking Active Self  nortriptyline (PAMELOR) 10 MG capsule [id number] No Take 2 capsules (20 mg total) by mouth at bedtime. Marcial Pacas, [name] Taking Active   pantoprazole (PROTONIX) 40 MG tablet [id number] No Take 1 tablet (40 mg total) by mouth 2 (two) times daily. Kozlow, Donnamarie Poag, [name] Taking Active   potassium  chloride SA (KLOR-CON) 20 MEQ tablet [id number] No Take 20 mEq by mouth in the morning. Provider, Historical, [name] Taking Active Self  RESTASIS 0.05 % ophthalmic emulsion [id number] No Place 1 drop into both eyes 2 (two) times daily. Provider, Historical, [name] Taking Active Self  sertraline (ZOLOFT) 50 MG tablet [id number] No Take 1 tablet (50 mg total) by mouth daily. [name] Dame, [name] Taking Active   sodium chloride (OCEAN) 0.65 % SOLN nasal spray [id number] No PLACE 1 SPRAY INTO BOTH NOSTRILS AS NEEDED FOR CONGESTION. [name], DO Taking Active Self  SUMAtriptan (IMITREX) 25 MG tablet [id number] No Take 1 tablet (25 mg total) by mouth every 2 (two) hours  as needed for migraine. May repeat in 2 hours if headache persists or recurs. Marcial Pacas, [name] Taking Active Self  SYMBICORT 160-4.5 MCG/ACT inhaler [id number] No Use 2 puffs 1-2 times a day Kozlow, Donnamarie Poag, [name] Taking Active   triamcinolone (NASACORT) 55 MCG/ACT AERO nasal inhaler [id number] No Place 1 spray into the nose 2 (two) times daily. 1 spray each nostril 2 times per day Jiles Prows, [name] Taking Active            Patient Active Problem List   Diagnosis Date Noted   Viral URI with cough 05/30/2022   Osteoporosis 03/15/2022   Anxiety 03/15/2022   Prediabetes 02/18/2022   Synovitis of left shoulder    Impingement syndrome of left shoulder 07/01/2021   Grief reaction 06/08/2020   Generalized anxiety disorder with panic attacks 06/20/2019   Osteoarthritis of thumb, right 11/29/2018   Chronic migraine w/o aura w/o status migrainosus, not intractable 08/27/2018   Disorder of left eustachian tube 05/19/2017   Vertigo of central origin 01/23/2017   Eustachian tube dysfunction, bilateral 06/15/2016   Ear pain, right 06/15/2016   Spondylosis of lumbar region without myelopathy or radiculopathy 10/08/2015   BPPV (benign paroxysmal positional vertigo) 10/02/2015   Liver fibrosis ([place]) 12/04/2014   Pituitary microadenoma ([place]) 08/08/2014   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   Vitamin D deficiency 06/29/2014   Secondary adrenal insufficiency ([place]) 06/29/2014   History of tympanostomy tube placement 02/07/2014   Refusal of blood transfusions as patient is Jehovah's Witness 11/18/2013   Hepatitis C virus infection cured after antiviral drug therapy 03/16/2012   Chronic hepatitis C ([place]) 03/16/2012   Health care maintenance 12/15/2011   Opioid dependence ([place]) 10/05/2010   HTN (hypertension) 10/05/2010   Hyperlipidemia associated with type 2 diabetes mellitus ([place]) 10/23/2008   HIV disease ([place]) 05/09/2006   Depression 05/09/2006   Allergic rhinitis 05/09/2006   GERD  05/09/2006   Conditions to be addressed/monitored per PCP order:  Chronic healthcare management needs, DM, HTN, asthma, GERD, anxiety, osteoarthritis, HLD, HIV, Addison's disease, pituitary adenoma  Care Plan : General Plan of Care (Adult)  Updates made by [name] Medicus, RN since 07/04/2022 12:00 AM     Problem: Health Promotion or Disease Self-Management (General Plan of Care)   Priority: Medium  Onset Date: 08/10/2020     Long-Range Goal: Self-Management Plan Developed   Start Date: 05/07/2020  Expected End Date: 10/04/2022  Recent Progress: On track  Priority: Medium  Note:   Current Barriers:  Chronic Disease Management support and education needs related to DM, HTN, asthma, GERD, anxiety, osteoarthritis, HLD, HIV 07/04/22:  BP stabilized blood sugars stabilized.  Patient's main complaint remains left shoulder and now left hand numbness, to have MRI and f/u with Dr. Marlou Sa.  Taking Zoloft for anxiety/stress.  Nurse  Case Manager Clinical Goal(s):  Over the next 30 days, patient will attend all scheduled medical appointments  Interventions:  Inter-disciplinary care team collaboration (see longitudinal plan of care) Evaluation of current treatment plan and patient's adherence to plan as established by provider. Reviewed medications with patient. Collaborated with pharmacy regarding medications. Discussed plans with patient for ongoing care management follow up and provided patient with direct contact information for care management team Reviewed scheduled/upcoming provider appointments. Pharmacy referral for medication review Patient provided with [facility] transportation information. 04/06/22:  followed up with Pharmacy for patient regarding medications  Asthma: (Status:New goal.) Long Term Goal Provided education about and advised patient to utilize infection prevention strategies to reduce risk of respiratory infection Discussed the importance of adequate rest and management of fatigue  with Asthma Assessed social determinant of health barriers    Diabetes Interventions:  (Status:  New goal.) Long Term Goal Assessed patient's understanding of A1c goal: <7% Reviewed medications with patient and discussed importance of medication adherence Counseled on importance of regular laboratory monitoring as prescribed Discussed plans with patient for ongoing care management follow up and provided patient with direct contact information for care management team Reviewed scheduled/upcoming provider appointments Review of patient status, including review of consultants reports, relevant laboratory and other test results, and medications completed Assessed social determinant of health barriers Lab Results  Component Value Date   HGBA1C 6.4 (A) 02/18/2022   Hyperlipidemia Interventions:  (Status:  New goal.) Long Term Goal Medication review performed; medication list updated in electronic medical record.  Provider established cholesterol goals reviewed Counseled on importance of regular laboratory monitoring as prescribed Assessed social determinant of health barriers   Hypertension Interventions:  (Status:  New goal.) Long Term Goal Last practice recorded BP readings:  BP Readings from Last 3 Encounters:  06/02/22 (!) 143/86  03/31/22 (!) 146/88  03/24/22 136/84  07/03/22        137/80. Most recent eGFR/CrCl:  Lab Results  Component Value Date   EGFR 73 02/08/2022    No components found for: "CRCL"  Evaluation of current treatment plan related to hypertension self management and patient's adherence to plan as established by provider Reviewed medications with patient and discussed importance of compliance Discussed plans with patient for ongoing care management follow up and provided patient with direct contact information for care management team Advised patient, providing education and rationale, to monitor blood pressure daily and record, calling PCP for findings outside  established parameters Reviewed scheduled/upcoming provider appointments  Assessed social determinant of health barriers   Patient Goals/Self-Care Activities Over the next 30 days, patient will:  -Attends all scheduled provider appointments Calls pharmacy for medication refills Calls provider office for new concerns or questions  Follow Up Plan: The Managed Medicaid care management team will reach out to the patient again over the next 30 business  days.  The patient has been provided with contact information for the Managed Medicaid care management team and has been advised to call with any health related questions or concerns.    Follow Up:  Patient agrees to Care Plan and Follow-up.  Plan: The Managed Medicaid care management team will reach out to the patient again over the next 30 business  days. and The  Patient has been provided with contact information for the Managed Medicaid care management team and has been advised to call with any health related questions or concerns.  Date/time of next scheduled RN care management/care coordination outreach:  09/02/22 at 0900.

## 2022-07-06 ENCOUNTER — Other Ambulatory Visit: Payer: Self-pay | Admitting: Surgical

## 2022-07-06 MED ORDER — DIAZEPAM 5 MG PO TABS: ORAL_TABLET | ORAL | 0 refills | Status: DC

## 2022-07-06 NOTE — Telephone Encounter (Signed)
I sent in prescription for Valium

## 2022-07-06 NOTE — Telephone Encounter (Signed)
Notified patient.

## 2022-07-26 ENCOUNTER — Other Ambulatory Visit: Payer: Medicaid Other

## 2022-07-26 ENCOUNTER — Ambulatory Visit
Admission: RE | Admit: 2022-07-26 | Discharge: 2022-07-26 | Disposition: A | Discharge status: Home / Self Care | Payer: Medicaid Other | Source: Ambulatory Visit | Attending: Orthopedic Surgery | Admitting: Orthopedic Surgery

## 2022-07-26 DIAGNOSIS — M79602 Pain in left arm: Secondary | ICD-10-CM

## 2022-07-26 DIAGNOSIS — M542 Cervicalgia: Secondary | ICD-10-CM | POA: Diagnosis not present

## 2022-07-26 DIAGNOSIS — M4802 Spinal stenosis, cervical region: Secondary | ICD-10-CM | POA: Diagnosis not present

## 2022-07-27 ENCOUNTER — Ambulatory Visit (INDEPENDENT_AMBULATORY_CARE_PROVIDER_SITE_OTHER): Payer: Medicaid Other | Admitting: Orthopedic Surgery

## 2022-07-27 DIAGNOSIS — M79602 Pain in left arm: Secondary | ICD-10-CM

## 2022-07-28 ENCOUNTER — Encounter: Payer: Self-pay | Admitting: Orthopedic Surgery

## 2022-07-28 NOTE — Progress Notes (Signed)
Office Visit Note   Patient: [name] [name]           Date of Birth: 08-Aug-1960           MRN: [id number] Visit Date: 07/27/2022 Requested by: Evlyn KannerBraswell, Phillip, MD [address] [place] [id number] PCP: Evlyn KannerBraswell, Phillip, MD  Subjective: Chief Complaint  Patient presents with   Other     Review MRI scan    HPI: [name] [name] is a 62 y.o. female who presents to the office reporting left shoulder symptoms.  Since she was last seen she had an MRI scan scheduled but that has not been resulted yet.  I reviewed the scan with her and it does not look like there is.  A definite problem affecting the left-hand side.  There is a broad-based disc bulge at C6-7 with a central disc protrusion.  Mild bilateral foraminal stenosis also present at C4-5.  She is not going to have any more shoulder surgery.  States her hand is numb.              ROS: All systems reviewed are negative as they relate to the chief complaint within the history of present illness.  Patient denies fevers or chills.  Assessment & Plan: Visit Diagnoses:  1. Left arm pain     Plan: Impression is mild foraminal stenosis which could be affecting the left arm for radicular symptoms.  I think could also be that she has some pain from the arthritis in her shoulder.  Her hand is numb which is a new symptom.  Plan is referral to Dr[facility] Alvester [facility]Newton [facility] for left-sided radiculopathy and shoulder pain.  Also nerve conduction left upper extremity to evaluate for carpal tunnel syndrome.  Follow-up after the studies.  Follow-Up Instructions: No follow-ups on file.   Orders:  Orders Placed This Encounter  Procedures   Ambulatory referral to Physical Medicine Rehab   Ambulatory referral to Physical Medicine Rehab   No orders of the defined types were placed in this encounter.     Procedures: No procedures performed   Clinical Data: No additional findings.  Objective: Vital Signs: There were no vitals taken for this  visit.  Physical Exam:  Constitutional: Patient appears well-developed HEENT:  Head: Normocephalic Eyes:EOM are normal Neck: Normal range of motion Cardiovascular: Normal rate Pulmonary/chest: Effort normal Neurologic: Patient is alert Skin: Skin is warm Psychiatric: Patient has normal mood and affect  Ortho Exam: Ortho exam demonstrates pretty reasonable cervical spine range of motion.  She has a little coarseness with passive range of motion of the left shoulder.  Deltoid is functional.  Negative Tinel's cubital tunnel in the left elbow.  Hand and wrist range of motion is full.  No abductor pollicis brevis wasting.  Has 5 out of 5 EPL FPL interosseous strength with no tenderness around the elbow.  No dorsal hand numbness.  On the left.  Specialty Comments:  No specialty comments available.  Imaging: No results found.   PMFS History: Patient Active Problem List   Diagnosis Date Noted   Viral URI with cough 05/30/2022   Osteoporosis 03/15/2022   Anxiety 03/15/2022   Prediabetes 02/18/2022   Synovitis of left shoulder    Impingement syndrome of left shoulder 07/01/2021   Grief reaction 06/08/2020   Generalized anxiety disorder with panic attacks 06/20/2019   Osteoarthritis of thumb, right 11/29/2018   Chronic migraine w/o aura w/o status migrainosus, not intractable 08/27/2018   Disorder of  left eustachian tube 05/19/2017   Vertigo of central origin 01/23/2017   Eustachian tube dysfunction, bilateral 06/15/2016   Ear pain, right 06/15/2016   Spondylosis of lumbar region without myelopathy or radiculopathy 10/08/2015   BPPV (benign paroxysmal positional vertigo) 10/02/2015   Liver fibrosis (HCC) 12/04/2014   Pituitary microadenoma 08/08/2014   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   Vitamin D deficiency 06/29/2014   Secondary adrenal insufficiency 06/29/2014   History of tympanostomy tube placement 02/07/2014   Refusal of blood transfusions as patient is Jehovah's  Witness 11/18/2013   Hepatitis C virus infection cured after antiviral drug therapy 03/16/2012   Chronic hepatitis C 03/16/2012   Health care maintenance 12/15/2011   Opioid dependence (HCC) 10/05/2010   HTN (hypertension) 10/05/2010   Hyperlipidemia associated with type 2 diabetes mellitus 10/23/2008   HIV disease 05/09/2006   Depression 05/09/2006   Allergic rhinitis 05/09/2006   GERD 05/09/2006   Past Medical History:  Diagnosis Date   Allergic rhinitis 05/09/2006   Allergy    Anxiety    Arthritis    Asthma    CHF (congestive heart failure)    Chronic back pain    Diabetes mellitus without complication    GERD (gastroesophageal reflux disease)    Heart murmur    as a child   Hepatitis C    genotype 1b, 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 s/p 12 week course of simeprevir and sofosbuvir between October 2014 and January 2015 with resolution.   History of shingles    HIV infection    1994   Hyperlipidemia    no meds taken now   Hypertension    Migraine    Pituitary microadenoma 08/08/2014   Pneumonia    Prediabetes    Refusal of blood transfusions as patient is Jehovah's Witness    Secondary adrenal insufficiency 06/29/2014   Urticaria     Family History  Problem Relation Age of Onset   Heart disease Mother    Diabetes Mother    Stroke Mother    Heart disease Father    Stroke Father    Diabetes Father    Hepatitis Sister        hcv   Asthma Sister    Allergic rhinitis Sister    Stroke Other    Colon polyps Brother    Renal Disease Brother    Cancer Sister        lung   Asthma Sister    Allergic rhinitis Sister    Cancer Maternal Aunt    Cancer Maternal Aunt    Colon cancer Neg Hx    Esophageal cancer Neg Hx    Stomach cancer Neg Hx    Rectal cancer Neg Hx    Angioedema Neg Hx    Eczema Neg Hx    Urticaria Neg Hx     Past Surgical History:  Procedure Laterality Date   BUNIONECTOMY     b/l   COLECTOMY     2003 for diverticulitis, had  colostomy bag and then reversed   COLONOSCOPY     HAND SURGERY     HAND SURGERY Right    right thumb surgery Dr Mina MarbleWeingold 2021   NASAL SINUS SURGERY     SHOULDER ARTHROSCOPY WITH OPEN ROTATOR CUFF REPAIR AND DISTAL CLAVICLE ACROMINECTOMY Left 08/31/2021   Procedure: LEFT SHOULDER ARTHROSCOPY, DEBRIDEMENT,  ROTATOR CUFF TEAR REPAIR;  Surgeon: Cammy Copaean, Jameon Deller Scott, MD;  Location: [facility] OR;  Service: Orthopedics;  Laterality: Left;   SHOULDER SURGERY  left   TONSILLECTOMY     Social History   Occupational History   Occupation: Disabled  Tobacco Use   Smoking status: Former    Years: 20    Types: Cigarettes    Quit date: 04/26/2007    Years since quitting: 15.2   Smokeless tobacco: Never   Tobacco comments:    QUIT 2009  Vaping Use   Vaping Use: Never used  Substance and Sexual Activity   Alcohol use: No    Alcohol/week: 0.0 standard drinks of alcohol   Drug use: Not Currently    Types: Marijuana    Comment: cannabis in the past   Sexual activity: Not Currently    Partners: Male    Birth control/protection: Condom    Comment: declined condoms

## 2022-08-01 ENCOUNTER — Other Ambulatory Visit: Payer: Self-pay | Admitting: Allergy and Immunology

## 2022-08-01 ENCOUNTER — Other Ambulatory Visit: Payer: Self-pay | Admitting: Orthopedic Surgery

## 2022-08-02 DIAGNOSIS — E119 Type 2 diabetes mellitus without complications: Secondary | ICD-10-CM | POA: Diagnosis not present

## 2022-08-02 DIAGNOSIS — E2749 Other adrenocortical insufficiency: Secondary | ICD-10-CM | POA: Diagnosis not present

## 2022-08-02 DIAGNOSIS — D352 Benign neoplasm of pituitary gland: Secondary | ICD-10-CM | POA: Diagnosis not present

## 2022-08-05 ENCOUNTER — Ambulatory Visit (INDEPENDENT_AMBULATORY_CARE_PROVIDER_SITE_OTHER): Payer: Medicaid Other | Admitting: Physical Medicine and Rehabilitation

## 2022-08-05 DIAGNOSIS — M79602 Pain in left arm: Secondary | ICD-10-CM

## 2022-08-05 DIAGNOSIS — M7542 Impingement syndrome of left shoulder: Secondary | ICD-10-CM | POA: Diagnosis not present

## 2022-08-05 DIAGNOSIS — M542 Cervicalgia: Secondary | ICD-10-CM | POA: Diagnosis not present

## 2022-08-05 DIAGNOSIS — R202 Paresthesia of skin: Secondary | ICD-10-CM

## 2022-08-05 DIAGNOSIS — G894 Chronic pain syndrome: Secondary | ICD-10-CM

## 2022-08-05 NOTE — Progress Notes (Unsigned)
Functional Pain Scale - descriptive words and definitions  Distracting (5)    Aware of pain/able to complete some ADL's but limited by pain/sleep is affected and active distractions are only slightly useful. Moderate range order  Average Pain  varies  Right handed. Left arm pain that starts  in upper arm and radiates down to the hand. Numbness in entire hand. Hard to hold on to things

## 2022-08-08 NOTE — Procedures (Signed)
EMG & NCV Findings: Evaluation of the left median (across palm) sensory nerve showed prolonged distal peak latency (Wrist, 6.0 ms), reduced amplitude (0.3 V), and prolonged distal peak latency (Palm, 6.3 ms).  All remaining nerves (as indicated in the following tables) were within normal limits.    All examined muscles (as indicated in the following table) showed no evidence of electrical instability.    Impression: The above electrodiagnostic study is ABNORMAL and reveals evidence of a mild left median nerve entrapment at the wrist (carpal tunnel syndrome) affecting sensory components. There is no significant electrodiagnostic evidence of any other focal nerve entrapment, brachial plexopathy or cervical radiculopathy.   Recommendations: 1.  Follow-up with referring physician. 2.  Continue current management of symptoms.  ___________________________ Naaman Plummer FAAPMR Board Certified, American Board of Physical Medicine and Rehabilitation    Nerve Conduction Studies Anti Sensory Summary Table   Stim Site NR Peak (ms) Norm Peak (ms) P-T Amp (V) Norm P-T Amp Site1 Site2 Delta-P (ms) Dist (cm) Vel (m/s) Norm Vel (m/s)  Left Median Acr Palm Anti Sensory (2nd Digit)  30.7C  Wrist    *6.0 <3.6 *0.3 >10 Wrist Palm 0.3 0.0    Palm    *6.3 <2.0 8.8         Left Radial Anti Sensory (Base 1st Digit)  30.7C  Wrist    1.9 <3.1 48.4  Wrist Base 1st Digit 1.9 0.0    Left Ulnar Anti Sensory (5th Digit)  31.1C  Wrist    3.3 <3.7 31.0 >15.0 Wrist 5th Digit 3.3 14.0 42 >38   Motor Summary Table   Stim Site NR Onset (ms) Norm Onset (ms) O-P Amp (mV) Norm O-P Amp Site1 Site2 Delta-0 (ms) Dist (cm) Vel (m/s) Norm Vel (m/s)  Left Median Motor (Abd Poll Brev)  30.4C  Wrist    3.1 <4.2 8.3 >5 Elbow Wrist 3.6 20.0 56 >50  Elbow    6.7  8.0         Left Ulnar Motor (Abd Dig Min)  30.3C  Wrist    2.9 <4.2 9.2 >3 B Elbow Wrist 3.3 19.0 58 >53  B Elbow    6.2  7.5  A Elbow B Elbow 1.5 10.0 67 >53  A  Elbow    7.7  8.4          EMG   Side Muscle Nerve Root Ins Act Fibs Psw Amp Dur Poly Recrt Int Dennie Bible Comment  Left 1stDorInt Ulnar C8-T1 Nml Nml Nml Nml Nml 0 Nml Nml   Left Abd Poll Brev Median C8-T1 Nml Nml Nml Nml Nml 0 Nml Nml   Left ExtDigCom   Nml Nml Nml Nml Nml 0 Nml Nml   Left Triceps Radial C6-7-8 Nml Nml Nml Nml Nml 0 Nml Nml   Left Deltoid Axillary C5-6 Nml Nml Nml Nml Nml 0 Nml Nml     Nerve Conduction Studies Anti Sensory Left/Right Comparison   Stim Site L Lat (ms) R Lat (ms) L-R Lat (ms) L Amp (V) R Amp (V) L-R Amp (%) Site1 Site2 L Vel (m/s) R Vel (m/s) L-R Vel (m/s)  Median Acr Palm Anti Sensory (2nd Digit)  30.7C  Wrist *6.0   *0.3   Wrist Palm     Palm *6.3   8.8         Radial Anti Sensory (Base 1st Digit)  30.7C  Wrist 1.9   48.4   Wrist Base 1st Digit     Ulnar Anti  Sensory (5th Digit)  31.1C  Wrist 3.3   31.0   Wrist 5th Digit 42     Motor Left/Right Comparison   Stim Site L Lat (ms) R Lat (ms) L-R Lat (ms) L Amp (mV) R Amp (mV) L-R Amp (%) Site1 Site2 L Vel (m/s) R Vel (m/s) L-R Vel (m/s)  Median Motor (Abd Poll Brev)  30.4C  Wrist 3.1   8.3   Elbow Wrist 56    Elbow 6.7   8.0         Ulnar Motor (Abd Dig Min)  30.3C  Wrist 2.9   9.2   B Elbow Wrist 58    B Elbow 6.2   7.5   A Elbow B Elbow 67    A Elbow 7.7   8.4            Waveforms:

## 2022-08-09 ENCOUNTER — Telehealth: Payer: Self-pay | Admitting: *Deleted

## 2022-08-09 NOTE — Telephone Encounter (Signed)
Call from patient staes recently got hearing aides. Has Yellow drainage coming from her ears.  Thinks she has an inner ear infection.   Has had frequent ear infections in the past. Unable to get in with her ENT for a couple months.  No available appointments in the Clinics for a few days. Patient was advised to go to the Urgent Care.  States has transportation problems.  Will have someone take her this afternoon to have her ear checked.

## 2022-08-09 NOTE — Telephone Encounter (Signed)
Ok thanks 

## 2022-08-10 ENCOUNTER — Ambulatory Visit (INDEPENDENT_AMBULATORY_CARE_PROVIDER_SITE_OTHER): Payer: Medicaid Other | Admitting: Physical Medicine and Rehabilitation

## 2022-08-10 ENCOUNTER — Other Ambulatory Visit: Payer: Self-pay

## 2022-08-10 VITALS — BP 129/81 | HR 84

## 2022-08-10 DIAGNOSIS — M5412 Radiculopathy, cervical region: Secondary | ICD-10-CM

## 2022-08-10 MED ORDER — METHYLPREDNISOLONE ACETATE 80 MG/ML IJ SUSP
80.0000 mg | Freq: Once | INTRAMUSCULAR | Status: AC
Start: 2022-08-10 — End: 2022-08-10
  Administered 2022-08-10: 80 mg

## 2022-08-10 NOTE — Procedures (Unsigned)
Cervical Epidural Steroid Injection - Interlaminar Approach with Fluoroscopic Guidance  Patient: [name] [name]      Date of Birth: 1961/02/01 MRN: [id number] PCP: Evlyn Kanner, MD      Visit Date: 08/10/2022   Universal Protocol:    Date/Time: 04/18/245:46 AM  Consent Given By: the patient  Position: PRONE  Additional Comments: Vital signs were monitored before and after the procedure. Patient was prepped and draped in the usual sterile fashion. The correct patient, procedure, and site was verified.   Injection Procedure Details:   Procedure diagnoses: Cervical radiculopathy [M54.12]    Meds Administered:  Meds ordered this encounter  Medications   methylPREDNISolone acetate (DEPO-MEDROL) injection 80 mg     Laterality: Left  Location/Site: C7-T1  Needle: 4.5 in., 20 ga. Tuohy  Needle Placement: Paramedian epidural space  Findings:  -Comments: Excellent flow of contrast into the epidural space.  Procedure Details: Using a paramedian approach from the side mentioned above, the region overlying the inferior lamina was localized under fluoroscopic visualization and the soft tissues overlying this structure were infiltrated with 4 ml. of 1% Lidocaine without Epinephrine. A # 20 gauge, Tuohy needle was inserted into the epidural space using a paramedian approach.  The epidural space was localized using loss of resistance along with contralateral oblique bi-planar fluoroscopic views.  After negative aspirate for air, blood, and CSF, a 2 ml. volume of Isovue-250 was injected into the epidural space and the flow of contrast was observed. Radiographs were obtained for documentation purposes.   The injectate was administered into the level noted above.  Additional Comments:  The patient tolerated the procedure well Dressing: 2 x 2 sterile gauze and Band-Aid    Post-procedure details: Patient was observed during the procedure. Post-procedure instructions were  reviewed.  Patient left the clinic in stable condition.

## 2022-08-10 NOTE — Patient Instructions (Signed)

## 2022-08-10 NOTE — Progress Notes (Unsigned)
Functional Pain Scale - descriptive words and definitions  Distressing (6)    Pain is present/unable to complete most ADLs limited by pain/sleep is difficult and active distraction is only marginal. Moderate range order  Average Pain  varies   +Driver, -BT, -Dye Allergies.  Neck pain on left side that radiates into left arm

## 2022-08-11 ENCOUNTER — Encounter: Payer: Self-pay | Admitting: Physical Medicine and Rehabilitation

## 2022-08-11 NOTE — Progress Notes (Signed)
[name] [name] - 62 y.o. female MRN [id number]  Date of birth: 01/12/1961  Office Visit Note: Visit Date: 08/10/2022 PCP: Evlyn Kanner, MD Referred by: Evlyn Kanner, MD  Subjective: Chief Complaint  Patient presents with   Neck - Pain   HPI:  [name] [name] is a 62 y.o. female who comes in today at the request of Dr. Burnard Bunting for planned Left C7-T1 Cervical Interlaminar epidural steroid injection with fluoroscopic guidance.  The patient has failed conservative care including home exercise, medications, time and activity modification.  This injection will be diagnostic and hopefully therapeutic.  Please see requesting physician notes for further details and justification.   ROS Otherwise per HPI.  Assessment & Plan: Visit Diagnoses:    ICD-10-CM   1. Cervical radiculopathy  M54.12 XR C-ARM NO REPORT    Epidural Steroid injection    methylPREDNISolone acetate (DEPO-MEDROL) injection 80 mg      Plan: No additional findings.   Meds & Orders:  Meds ordered this encounter  Medications   methylPREDNISolone acetate (DEPO-MEDROL) injection 80 mg    Orders Placed This Encounter  Procedures   XR C-ARM NO REPORT   Epidural Steroid injection    Follow-up: Return for visit to requesting provider as needed.   Procedures: No procedures performed  Cervical Epidural Steroid Injection - Interlaminar Approach with Fluoroscopic Guidance  Patient: [name] [name]      Date of Birth: [birth year] MRN: [id number] PCP: Evlyn Kanner, MD      Visit Date: 08/10/2022   Universal Protocol:    Date/Time: 04/18/245:46 AM  Consent Given By: the patient  Position: PRONE  Additional Comments: Vital signs were monitored before and after the procedure. Patient was prepped and draped in the usual sterile fashion. The correct patient, procedure, and site was verified.   Injection Procedure Details:   Procedure diagnoses: Cervical radiculopathy [M54.12]    Meds Administered:   Meds ordered this encounter  Medications   methylPREDNISolone acetate (DEPO-MEDROL) injection 80 mg     Laterality: Left  Location/Site: C7-T1  Needle: 4.5 in., 20 ga. Tuohy  Needle Placement: Paramedian epidural space  Findings:  -Comments: Excellent flow of contrast into the epidural space.  Procedure Details: Using a paramedian approach from the side mentioned above, the region overlying the inferior lamina was localized under fluoroscopic visualization and the soft tissues overlying this structure were infiltrated with 4 ml. of 1% Lidocaine without Epinephrine. A # 20 gauge, Tuohy needle was inserted into the epidural space using a paramedian approach.  The epidural space was localized using loss of resistance along with contralateral oblique bi-planar fluoroscopic views.  After negative aspirate for air, blood, and CSF, a 2 ml. volume of Isovue-250 was injected into the epidural space and the flow of contrast was observed. Radiographs were obtained for documentation purposes.   The injectate was administered into the level noted above.  Additional Comments:  The patient tolerated the procedure well Dressing: 2 x 2 sterile gauze and Band-Aid    Post-procedure details: Patient was observed during the procedure. Post-procedure instructions were reviewed.  Patient left the clinic in stable condition.   Clinical History: MRI CERVICAL SPINE WITHOUT CONTRAST   TECHNIQUE: Multiplanar, multisequence MR imaging of the cervical spine was performed. No intravenous contrast was administered.   COMPARISON:  None Available.   FINDINGS: Alignment: No static listhesis. Loss of the normal cervical lordosis with straightening.   Vertebrae: No acute fracture, evidence of discitis, or aggressive bone lesion.  Cord: Normal signal and morphology.   Posterior Fossa, vertebral arteries, paraspinal tissues: Posterior fossa demonstrates no focal abnormality. Vertebral artery flow  voids are maintained. Paraspinal soft tissues are unremarkable.   Disc levels:   Discs: Mild degenerative disease with disc height loss at C4-5.   C2-3: No significant disc bulge. No neural foraminal stenosis. No central canal stenosis.   C3-4: Tiny central disc protrusion. No foraminal or central canal stenosis.   C4-5: Mild broad-based disc bulge. Bilateral uncovertebral degenerative changes. Mild bilateral foraminal stenosis. No central canal stenosis.   C5-6: No significant disc bulge. No neural foraminal stenosis. No central canal stenosis.   C6-7: Broad-based disc bulge with a central disc protrusion contacting the ventral cervical spinal cord. No foraminal or central canal stenosis.   C7-T1: Broad-based disc bulge eccentric towards the right. Moderate right and mild left foraminal stenosis. No spinal stenosis.   IMPRESSION: 1. At C4-5 there is a mild broad-based disc bulge. Bilateral uncovertebral degenerative changes. Mild bilateral foraminal stenosis. 2. At C6-7 there is a broad-based disc bulge with a central disc protrusion contacting the ventral cervical spinal cord. 3. At C7-T1 there is a broad-based disc bulge eccentric towards the right. Moderate right and mild left foraminal stenosis. 4. No acute osseous injury of the cervical spine.     Electronically Signed   By: Elige Ko M.D.   On: 07/28/2022 08:59     Objective:  VS:  HT:    WT:   BMI:     BP:129/81  HR:84bpm  TEMP: ( )  RESP:  Physical Exam Vitals and nursing note reviewed.  Constitutional:      General: She is not in acute distress.    Appearance: Normal appearance. She is not ill-appearing.  HENT:     Head: Normocephalic and atraumatic.     Right Ear: External ear normal.     Left Ear: External ear normal.  Eyes:     Extraocular Movements: Extraocular movements intact.  Cardiovascular:     Rate and Rhythm: Normal rate.     Pulses: Normal pulses.  Musculoskeletal:      Cervical back: Tenderness present. No rigidity.     Right lower leg: No edema.     Left lower leg: No edema.     Comments: Patient has good strength in the upper extremities including 5 out of 5 strength in wrist extension long finger flexion and APB.  There is no atrophy of the hands intrinsically.  There is a negative Hoffmann's test.   Lymphadenopathy:     Cervical: No cervical adenopathy.  Skin:    Findings: No erythema, lesion or rash.  Neurological:     General: No focal deficit present.     Mental Status: She is alert and oriented to person, place, and time.     Sensory: No sensory deficit.     Motor: No weakness or abnormal muscle tone.     Coordination: Coordination normal.  Psychiatric:        Mood and Affect: Mood normal.        Behavior: Behavior normal.      Imaging: No results found.

## 2022-08-11 NOTE — Progress Notes (Signed)
[name] [name] - 62 y.o. female MRN [id number]  Date of birth: 1961-02-17  Office Visit Note: Visit Date: 08/05/2022 PCP: Evlyn Kanner, MD Referred by: Cammy Copa, MD  Subjective: Chief Complaint  Patient presents with   Left Hand - Pain, Numbness, Weakness   HPI:  [name] [name] is a 62 y.o. female who comes in today for evaluation and management at the request of Dr. Burnard Bunting for complicated chronic pain history of neck pain left shoulder pain with pain in the left arm referring down into the hand with pain numbness and tingling in the left hand.  She is right-hand dominant.  Her case is complicated by history of HIV, hepatitis C, diabetes and migraine headache, Addison's disease and chronic pain syndrome.  She takes daily cortisone for Addison's disease.  Has some findings of mild Cushing syndrome.  She has had left shoulder rotator cuff surgery and decompression with continued chronic shoulder pain with pain down the arm.  She has a chronic long-term history of neck and lower back pain.  I have actually seen her in the past for her lower back many years ago.  She reports numbness in the entire hand nondermatomal and global.  She reports is hard to hold onto objects and reports weakness.  She denies any right-sided complaints.  Cervical spine MRI is reviewed today with her.  We discussed at length the usefulness of diagnostic cervical epidural injection.  Cervical MRI is really nonfocal she does have some degenerative changes and central disc protrusions but no high-grade stenosis or nerve compression.  No specific left-sided finding.  She has had no prior cervical surgery.    I spent more than 30 minutes speaking face-to-face with the patient with 50% of the time in counseling and discussing coordination of care.         Review of Systems  Musculoskeletal:  Positive for back pain, joint pain and neck pain.  Neurological:  Positive for tingling and weakness.  All other  systems reviewed and are negative.  Otherwise per HPI.  Assessment & Plan: Visit Diagnoses:    ICD-10-CM   1. Paresthesia of skin  R20.2 NCV with EMG (electromyography)    2. Left arm pain  M79.602     3. Impingement syndrome of left shoulder  M75.42     4. Cervicalgia  M54.2     5. Chronic pain syndrome  G89.4       Plan: Impression: Symptoms are multifactorial and she clearly has some pain likely from her shoulder still but does have features that could be consistent with a radiculopathy with cervical MRI fairly nonfocal and symptoms global into the hand.  Electrodiagnostic study performed today.  The above electrodiagnostic study is ABNORMAL and reveals evidence of a mild left median nerve entrapment at the wrist (carpal tunnel syndrome) affecting sensory components.   There is no significant electrodiagnostic evidence of any other focal nerve entrapment, brachial plexopathy or cervical radiculopathy.  As you know, this particular electrodiagnostic study cannot rule out chemical radiculitis or sensory only radiculopathy.  Careful clinical correlation is paramount as the mild median neuropathy would likely not explain the totality of her symptoms.  Recommendations: 1.  Follow-up with referring physician.  Will schedule for diagnostic cervical epidural injection. 2.  Continue current management of symptoms.  Consider comprehensive pain management program.  Meds & Orders: No orders of the defined types were placed in this encounter.   Orders Placed This Encounter  Procedures  NCV with EMG (electromyography)    Follow-up: Return for  Burnard Bunting, MD.   Procedures: No procedures performed  EMG & NCV Findings: Evaluation of the left median (across palm) sensory nerve showed prolonged distal peak latency (Wrist, 6.0 ms), reduced amplitude (0.3 V), and prolonged distal peak latency (Palm, 6.3 ms).  All remaining nerves (as indicated in the following tables) were within normal  limits.    All examined muscles (as indicated in the following table) showed no evidence of electrical instability.    Impression: The above electrodiagnostic study is ABNORMAL and reveals evidence of a mild left median nerve entrapment at the wrist (carpal tunnel syndrome) affecting sensory components.   There is no significant electrodiagnostic evidence of any other focal nerve entrapment, brachial plexopathy or cervical radiculopathy.  As you know, this particular electrodiagnostic study cannot rule out chemical radiculitis or sensory only radiculopathy.  Careful clinical correlation is paramount as the mild median neuropathy would likely not explain the totality of her symptoms.  Recommendations: 1.  Follow-up with referring physician.  Will schedule for diagnostic cervical epidural injection. 2.  Continue current management of symptoms.  Consider comprehensive pain management program.  ___________________________ Naaman Plummer [facility] Board Certified, American Board of Physical Medicine and Rehabilitation    Nerve Conduction Studies Anti Sensory Summary Table   Stim Site NR Peak (ms) Norm Peak (ms) P-T Amp (V) Norm P-T Amp Site1 Site2 Delta-P (ms) Dist (cm) Vel (m/s) Norm Vel (m/s)  Left Median Acr Palm Anti Sensory (2nd Digit)  30.7C  Wrist    *6.0 <3.6 *0.3 >10 Wrist Palm 0.3 0.0    Palm    *6.3 <2.0 8.8         Left Radial Anti Sensory (Base 1st Digit)  30.7C  Wrist    1.9 <3.1 48.4  Wrist Base 1st Digit 1.9 0.0    Left Ulnar Anti Sensory (5th Digit)  31.1C  Wrist    3.3 <3.7 31.0 >15.0 Wrist 5th Digit 3.3 14.0 42 >38   Motor Summary Table   Stim Site NR Onset (ms) Norm Onset (ms) O-P Amp (mV) Norm O-P Amp Site1 Site2 Delta-0 (ms) Dist (cm) Vel (m/s) Norm Vel (m/s)  Left Median Motor (Abd Poll Brev)  30.4C  Wrist    3.1 <4.2 8.3 >5 Elbow Wrist 3.6 20.0 56 >50  Elbow    6.7  8.0         Left Ulnar Motor (Abd Dig Min)  30.3C  Wrist    2.9 <4.2 9.2 >3 B Elbow Wrist 3.3  19.0 58 >53  B Elbow    6.2  7.5  A Elbow B Elbow 1.5 10.0 67 >53  A Elbow    7.7  8.4          EMG   Side Muscle Nerve Root Ins Act Fibs Psw Amp Dur Poly Recrt Int Dennie Bible Comment  Left 1stDorInt Ulnar C8-T1 Nml Nml Nml Nml Nml 0 Nml Nml   Left Abd Poll Brev Median C8-T1 Nml Nml Nml Nml Nml 0 Nml Nml   Left ExtDigCom   Nml Nml Nml Nml Nml 0 Nml Nml   Left Triceps Radial C6-7-8 Nml Nml Nml Nml Nml 0 Nml Nml   Left Deltoid Axillary C5-6 Nml Nml Nml Nml Nml 0 Nml Nml     Nerve Conduction Studies Anti Sensory Left/Right Comparison   Stim Site L Lat (ms) R Lat (ms) L-R Lat (ms) L Amp (V) R Amp (V) L-R Amp (%)  Site1 Site2 L Vel (m/s) R Vel (m/s) L-R Vel (m/s)  Median Acr Palm Anti Sensory (2nd Digit)  30.7C  Wrist *6.0   *0.3   Wrist Palm     Palm *6.3   8.8         Radial Anti Sensory (Base 1st Digit)  30.7C  Wrist 1.9   48.4   Wrist Base 1st Digit     Ulnar Anti Sensory (5th Digit)  31.1C  Wrist 3.3   31.0   Wrist 5th Digit 42     Motor Left/Right Comparison   Stim Site L Lat (ms) R Lat (ms) L-R Lat (ms) L Amp (mV) R Amp (mV) L-R Amp (%) Site1 Site2 L Vel (m/s) R Vel (m/s) L-R Vel (m/s)  Median Motor (Abd Poll Brev)  30.4C  Wrist 3.1   8.3   Elbow Wrist 56    Elbow 6.7   8.0         Ulnar Motor (Abd Dig Min)  30.3C  Wrist 2.9   9.2   B Elbow Wrist 58    B Elbow 6.2   7.5   A Elbow B Elbow 67    A Elbow 7.7   8.4            Waveforms:             Clinical History: MRI CERVICAL SPINE WITHOUT CONTRAST   TECHNIQUE: Multiplanar, multisequence MR imaging of the cervical spine was performed. No intravenous contrast was administered.   COMPARISON:  None Available.   FINDINGS: Alignment: No static listhesis. Loss of the normal cervical lordosis with straightening.   Vertebrae: No acute fracture, evidence of discitis, or aggressive bone lesion.   Cord: Normal signal and morphology.   Posterior Fossa, vertebral arteries, paraspinal tissues: Posterior fossa  demonstrates no focal abnormality. Vertebral artery flow voids are maintained. Paraspinal soft tissues are unremarkable.   Disc levels:   Discs: Mild degenerative disease with disc height loss at C4-5.   C2-3: No significant disc bulge. No neural foraminal stenosis. No central canal stenosis.   C3-4: Tiny central disc protrusion. No foraminal or central canal stenosis.   C4-5: Mild broad-based disc bulge. Bilateral uncovertebral degenerative changes. Mild bilateral foraminal stenosis. No central canal stenosis.   C5-6: No significant disc bulge. No neural foraminal stenosis. No central canal stenosis.   C6-7: Broad-based disc bulge with a central disc protrusion contacting the ventral cervical spinal cord. No foraminal or central canal stenosis.   C7-T1: Broad-based disc bulge eccentric towards the right. Moderate right and mild left foraminal stenosis. No spinal stenosis.   IMPRESSION: 1. At C4-5 there is a mild broad-based disc bulge. Bilateral uncovertebral degenerative changes. Mild bilateral foraminal stenosis. 2. At C6-7 there is a broad-based disc bulge with a central disc protrusion contacting the ventral cervical spinal cord. 3. At C7-T1 there is a broad-based disc bulge eccentric towards the right. Moderate right and mild left foraminal stenosis. 4. No acute osseous injury of the cervical spine.     Electronically Signed   By: Elige Ko M.D.   On: 07/28/2022 08:59     Objective:  VS:  HT:    WT:   BMI:     BP:   HR: bpm  TEMP: ( )  RESP:  Physical Exam Vitals and nursing note reviewed.  Constitutional:      General: She is not in acute distress.    Appearance: Normal appearance. She is well-developed. She is not  ill-appearing.  HENT:     Head: Normocephalic and atraumatic.  Eyes:     Conjunctiva/sclera: Conjunctivae normal.     Pupils: Pupils are equal, round, and reactive to light.  Neck:     Comments: Initial development of "buffalo hump  "from daily cortisone. Cardiovascular:     Rate and Rhythm: Normal rate.     Pulses: Normal pulses.  Pulmonary:     Effort: Pulmonary effort is normal.     Breath sounds: No rales.  Musculoskeletal:        General: No swelling or deformity.     Cervical back: Neck supple. Tenderness present.     Right lower leg: No edema.     Left lower leg: No edema.     Comments: Inspection reveals no atrophy of the bilateral APB or FDI or hand intrinsics. There is no swelling, color changes, allodynia or dystrophic changes. There is 5 out of 5 strength in the bilateral wrist extension, finger abduction and long finger flexion. There is intact sensation to light touch in all dermatomal and peripheral nerve distributions. There is a negative Froment's test bilaterally. There is a negative Tinel's test at the bilateral wrist and elbow. There is a negative Phalen's test bilaterally. There is a negative Hoffmann's test bilaterally.  Skin:    General: Skin is warm and dry.     Findings: No erythema or rash.  Neurological:     General: No focal deficit present.     Mental Status: She is alert and oriented to person, place, and time.     Sensory: No sensory deficit.     Motor: No weakness or abnormal muscle tone.     Coordination: Coordination normal.     Gait: Gait normal.  Psychiatric:        Mood and Affect: Mood normal.        Behavior: Behavior normal.      Imaging: No results found.

## 2022-08-15 ENCOUNTER — Other Ambulatory Visit: Payer: Self-pay

## 2022-08-15 MED ORDER — VITAMIN D3 25 MCG (1000 UNIT) PO TABS: 1000.0000 [IU] | ORAL_TABLET | Freq: Every day | ORAL | 2 refills | Status: DC

## 2022-08-17 ENCOUNTER — Other Ambulatory Visit: Payer: Self-pay

## 2022-08-17 ENCOUNTER — Encounter: Payer: Self-pay | Admitting: Student

## 2022-08-17 ENCOUNTER — Ambulatory Visit: Payer: Medicaid Other | Admitting: Student

## 2022-08-17 ENCOUNTER — Encounter: Payer: Medicaid Other | Admitting: Student

## 2022-08-17 VITALS — BP 158/84 | HR 77 | Temp 97.4°F | Ht 61.0 in | Wt 173.9 lb

## 2022-08-17 DIAGNOSIS — H60312 Diffuse otitis externa, left ear: Secondary | ICD-10-CM

## 2022-08-17 DIAGNOSIS — H6092 Unspecified otitis externa, left ear: Secondary | ICD-10-CM

## 2022-08-17 HISTORY — DX: Unspecified otitis externa, left ear: H60.92

## 2022-08-17 MED ORDER — CIPROFLOXACIN-DEXAMETHASONE 0.3-0.1 % OT SUSP: 4.0000 [drp] | Freq: Two times a day (BID) | OTIC | 0 refills | Status: DC

## 2022-08-17 NOTE — Progress Notes (Signed)
   CC: Ear drainage  HPI:  [name] is a 62 y.o. female with PMH as below who presents to clinic for evaluation of L ear drainage. Please see problem based charting for evaluation, assessment and plan.  Past Medical History:  Diagnosis Date   Allergic rhinitis 05/09/2006   Allergy    Anxiety    Arthritis    Asthma    CHF (congestive heart failure)    Chronic back pain    Diabetes mellitus without complication    GERD (gastroesophageal reflux disease)    Heart murmur    as a child   Hepatitis C    genotype 1b, 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 s/p 12 week course of simeprevir and sofosbuvir between October 2014 and January 2015 with resolution.   History of shingles    HIV infection    1994   Hyperlipidemia    no meds taken now   Hypertension    Migraine    Pituitary microadenoma 08/08/2014   Pneumonia    Prediabetes    Refusal of blood transfusions as patient is Jehov[employer]'s Witness    Secondary adrenal insufficiency 06/29/2014   Urticaria     Review of Systems:  Constitutional: Negative for fever, chills or fatigue Ears: Positive for left ear drainage, pain and hearing loss Eyes: Negative for visual changes MSK: Positive for left knee pain Neuro: Negative for headache, dizziness or weakness  Physical Exam: General: Pleasant, well-appearing woman.  No acute distress. Ears: Left ear with whitish exudate in the ear canal and the with mild erythema.  Tympanic membrane intact and transparent.  Mild tenderness to palpation of the external ear and mastoid process. Right ear unremarkable. Cardiac: RRR. No murmurs, rubs or gallops. No LE edema Respiratory: Lungs CTAB. No wheezing or crackles. Skin: Warm, dry and intact without rashes or lesions  Vitals:   08/17/22 0924 08/17/22 0933  BP: (!) 146/82 (!) 158/84  Pulse: 76 77  Temp: (!) 97.4 F (36.3 C)   TempSrc: Oral   SpO2: 98%   Weight: 173 lb 14.4 oz (78.9 kg)   Height:  (1.549 m)      Assessment & Plan:   Otitis externa of left ear Patient with a history of bilateral eustachian tube dysfunction here for evaluation of left ear drainage, pain and hearing loss. Patient states she has chronic hearing loss in her left ear and she recently received a hearing aid with the assistance of her ENT doctor. Over the last few days, she has noted whitish drainage from her left ear when she wakes up in the morning. She also reports associated pain behind her left ear and worsening hearing loss. The right ear is fine. She denies any fevers, chills, headaches or recent illness. She reported a history of ear infections and follows closely with ENT. On exam, left ear has mild erythema with whitish exudate in the canal.  Tympanic membrane is unremarkable. There is mild tenderness to palpation of the mastoid process as well as the tragus of the external ear. Her presentation is consistent with otitis externa of the left ear.  Plan: -Start Ciprodex otic suspension 4 drops into left ear twice daily -Advised her not to place an foreign objects including hearing aid until her ear infection has been treated -Follow up with ENT as scheduled on 5/2   See Encounters Tab for problem based charting.  Patient discussed with Dr. Yolande Jolly, MD, MPH

## 2022-08-17 NOTE — Patient Instructions (Signed)
Thank you, Ms.[name] for allowing [place] to provide your care today. Today, we discussed your ear pain. I am prescribing you an antibiotic eardrop to use for your ear infection. Please follow-up with your ENT doctor in May.  For your recent knee injury, continue to ice packs and as needed ibuprofen for the pain.  Follow-up for for evaluation in 1 week if it does not improve.  I have ordered the following medication/changed the following medications:  Start Ciprodex otic suspension, apply 4 drops into left ear twice daily  My Chart Access: [email or web address]  Please follow-up as needed  Please make sure to arrive 15 minutes prior to your next appointment. If you arrive late, you may be asked to reschedule.    We look forward to seeing you next time. Please call our clinic at [phone] if you have any questions or concerns. The best time to call is Monday-Friday from 9am-4pm, but there is someone available 24/7. If after hours or the weekend, call the main hospital number and ask for the Internal Medicine Resident On-Call. If you need medication refills, please notify your pharmacy one week in advance and they will send [place] a request.   Thank you for letting us take part in your care. Wishing you the best!  Steffanie Rainwater, MD 08/17/2022, 9:55 AM IM Resident, PGY-3 [place] Heck 41:10

## 2022-08-17 NOTE — Assessment & Plan Note (Addendum)
Patient with a history of bilateral eustachian tube dysfunction here for evaluation of left ear drainage, pain and hearing loss. Patient states she has chronic hearing loss in her left ear and she recently received a hearing aid with the assistance of her ENT doctor. Over the last few days, she has noted whitish drainage from her left ear when she wakes up in the morning. She also reports associated pain behind her left ear and worsening hearing loss. The right ear is fine. She denies any fevers, chills, headaches or recent illness. She reported a history of ear infections and follows closely with ENT. On exam, left ear has mild erythema with whitish exudate in the canal.  Tympanic membrane is unremarkable. There is mild tenderness to palpation of the mastoid process as well as the tragus of the external ear. Her presentation is consistent with otitis externa of the left ear.  Plan: -Start Ciprodex otic suspension 4 drops into left ear twice daily -Advised her not to place an foreign objects including hearing aid until her ear infection has been treated -Follow up with ENT as scheduled on 5/2

## 2022-08-18 ENCOUNTER — Telehealth: Payer: Self-pay

## 2022-08-18 ENCOUNTER — Ambulatory Visit (INDEPENDENT_AMBULATORY_CARE_PROVIDER_SITE_OTHER): Payer: Medicaid Other | Admitting: Student

## 2022-08-18 ENCOUNTER — Other Ambulatory Visit: Payer: Self-pay | Admitting: Surgical

## 2022-08-18 DIAGNOSIS — F419 Anxiety disorder, unspecified: Secondary | ICD-10-CM

## 2022-08-18 MED ORDER — SERTRALINE HCL 100 MG PO TABS
100.0000 mg | ORAL_TABLET | Freq: Every day | ORAL | 3 refills | Status: DC
Start: 2022-08-18 — End: 2023-07-03

## 2022-08-18 NOTE — Telephone Encounter (Signed)
Return pt's call - stated she wants an increase of Zoloft; stated she's under stress; having health issues and pain. Stated she forgot to mention this to the doctor at her appt yesterday. She's agreeable to schedule telehealth appt - call transferred to front office. Telehealth Appt schedule today 4/25 with Dr Benito Mccreedy.

## 2022-08-18 NOTE — Assessment & Plan Note (Signed)
Culmination of several events including her own health problems, death of her brother have resulted in increased stress and anxiety recently.  I think it is reasonable to increase her sertraline today.  Be on the look out for serotonin syndrome given that she is also taking nortriptyline and sumatriptan for headaches. - Increase sertraline to 100 mg daily

## 2022-08-18 NOTE — Progress Notes (Signed)
   I connected with  [name] [name] on 08/18/22 by telephone and verified that I am speaking with the correct person using two identifiers.   I discussed the limitations of evaluation and management by telemedicine. The patient expressed understanding and agreed to proceed.  CC: anxiety and [name] problems.  This is a telephone encounter between [name] [name] and [name] [name] on 08/18/2022 for stress, anxiety, and depressed [name]. The visit was conducted with the patient located at home and [name] [name] at [facility]. The patient's identity was confirmed using their DOB and current address. The patient has consented to being evaluated through a telephone encounter and understands the associated risks (an examination cannot be done and the patient may need to come in for an appointment) / benefits (allows the patient to remain at home, decreasing exposure to coronavirus). I personally spent 15 minutes on medical discussion.   HPI:  [name] is a 62 y.o. with PMH as below.   Surgery last year, lots of issues since surgery. Had rotator cuff surgery. Hands are numb now, has been getting neck injections. Then had a knee injury which set her back. Just had fitting for hearing aid but now can't wear it because of ear infection. Brother passed away recently.    Primarily concerned about stress, anxiety, and sadness.  She had a rotator cuff surgery last year and has suffered a lot of issues with pain since then.  Her hands are numb now and she needs neck injections to manage this problem.  Then she had a knee injury which set her back.  She was recently fitted for hearing aid which she was really happy about but now cannot wear it because of an external ear infection.  Top of all this her brother passed away recently and this was very difficult for her.  Culmination of all of these events has her feeling very down, anxious and depressed.  She reports that her sertraline, which she has been on for 6 months  initially worked very well and that she likes that medicine, tolerating it well with limited side effects.  She wonders about increasing the dose of this medicine today.  She denies thoughts of self-harm or suicide.  Assessment & Plan:   Anxiety Culmination of several events including her own health problems, death of her brother have resulted in increased stress and anxiety recently.  I think it is reasonable to increase her sertraline today.  Be on the look out for serotonin syndrome given that she is also taking nortriptyline and sumatriptan for headaches. - Increase sertraline to 100 mg daily    Patient discussed with Dr. Jan Fireman MD [facility] Internal Medicine  PGY-1 Pager: [phone] Date 08/18/2022  Time 4:05 PM

## 2022-08-18 NOTE — Patient Instructions (Signed)
This after visit summary is an important review of tests, referrals, and medication changes that were discussed during your visit. If you have questions or concerns, call [phone]. Outside of clinic business hours, call the main hospital at [phone] and ask the operator for the on-call internal medicine resident.   Ernesta Amble MD 08/18/2022, 4:06 PM

## 2022-08-18 NOTE — Telephone Encounter (Signed)
Requesting to speak with a nurse about getting sertraline (ZOLOFT) 50 MG tablet to be increased. Please call pt back.

## 2022-08-22 NOTE — Progress Notes (Signed)
Internal Medicine Clinic Attending  Case discussed with Dr. McLendon  At the time of the visit.  We reviewed the resident's history and exam and pertinent patient test results.  I agree with the assessment, diagnosis, and plan of care documented in the resident's note.  

## 2022-08-22 NOTE — Addendum Note (Signed)
Addended by: Dickie La on: 08/22/2022 03:33 PM   Modules accepted: Level of Service

## 2022-08-23 ENCOUNTER — Other Ambulatory Visit: Payer: Self-pay

## 2022-08-23 ENCOUNTER — Ambulatory Visit (INDEPENDENT_AMBULATORY_CARE_PROVIDER_SITE_OTHER): Payer: Medicaid Other | Admitting: Infectious Diseases

## 2022-08-23 ENCOUNTER — Other Ambulatory Visit (HOSPITAL_COMMUNITY): Payer: Self-pay

## 2022-08-23 ENCOUNTER — Encounter: Payer: Self-pay | Admitting: Infectious Diseases

## 2022-08-23 VITALS — BP 135/86 | HR 83 | Temp 98.6°F | Ht 61.0 in | Wt 175.0 lb

## 2022-08-23 DIAGNOSIS — B20 Human immunodeficiency virus [HIV] disease: Secondary | ICD-10-CM

## 2022-08-23 DIAGNOSIS — R61 Generalized hyperhidrosis: Secondary | ICD-10-CM | POA: Diagnosis not present

## 2022-08-23 HISTORY — DX: Generalized hyperhidrosis: R61

## 2022-08-23 MED ORDER — DOVATO 50-300 MG PO TABS: 1.0000 | ORAL_TABLET | Freq: Every day | ORAL | 11 refills | Status: DC

## 2022-08-23 NOTE — Assessment & Plan Note (Signed)
Has been present for years now. I don't expect this has anything to do with HIV, infection or undiagnosed malignancy. Most suspect pattern is due to vasomotor symptoms. She is also on nortriptyline chronically and could be due to that.  I encouraged her to discuss with her IM team at follow up next month if low dose HRT would be of use/benefit to her.  Seems low risk for starting it (no cancer history, CVA/clot history, non-smoker) and may offer benefit to bone health.

## 2022-08-23 NOTE — Progress Notes (Signed)
Subjective:    [name], female    DOB: 06/06/1960, 62 y.o.   MRN: [id number]   Chief Complaint  Patient presents with   Follow-up      Brief Narrative:  [name] [name] is a 62 y.o. female with well controlled HIV (VL 36, CD4 800) on Biktarvy since 2018. She is a type 2 diabetic, on chronic prednisone for Addison's disease a h/o pituitary adenoma.   Previous regimens for HIV treatment include Lexiva/Norvir/Truvada, Isentress/Truvada (rashes), Prezcobix/Truvada (rash).  History of treated Hepatitis C (completed 04/2013 with [facility]).     HPI:  [name] [name] is here for follow up and doing well. She was not able to work with research team for switch to [employer] study. She is still interested in making the mediation switch however and wants to review the benefits in doing so.   Has had a lot going on - her shoulder has been troubling her despite surgery last year. Has had a steroid injection in the neck recently that seems   Hydrocortisone ongoing 5 mg dosed daily for Addison's. Trying to stay on the lowest dose possible. No weight gain or loss.   Has had night sweats for years - this is very interrupting for her sleep. She does not notice these symptoms too much during the day.    Review of Systems  Constitutional:  Negative for chills and fever.  HENT:  Negative for sore throat.        No dental problems  Respiratory:  Negative for cough.   Cardiovascular:  Negative for chest pain and leg swelling.  Gastrointestinal:  Negative for abdominal pain, diarrhea and vomiting.  Genitourinary:  Negative for dysuria and flank pain.  Musculoskeletal:  Positive for neck pain. Negative for myalgias.       Limited ROM left shoulder and pain   Skin:  Negative for rash.  Neurological:  Negative for dizziness and headaches.  Psychiatric/Behavioral:  The patient is not nervous/anxious.        Objective:   Physical Exam Vitals reviewed.  Constitutional:      Appearance: She is  well-developed.     Comments: Seated comfortably in chair.   HENT:     Mouth/Throat:     Mouth: No oral lesions.     Dentition: Normal dentition. No dental abscesses.     Pharynx: No oropharyngeal exudate.  Cardiovascular:     Rate and Rhythm: Normal rate and regular rhythm.     Heart sounds: Normal heart sounds.  Pulmonary:     Effort: Pulmonary effort is normal.     Breath sounds: Normal breath sounds.  Abdominal:     General: There is no distension.     Palpations: Abdomen is soft.     Tenderness: There is no abdominal tenderness.  Lymphadenopathy:     Cervical: No cervical adenopathy.  Skin:    General: Skin is warm and dry.     Findings: No rash.  Neurological:     Mental Status: She is alert and oriented to person, place, and time.  Psychiatric:        Judgment: Judgment normal.     Comments: In good spirits today and engaged in care discussion    Vitals:   08/23/22 0931  Weight: 175 lb (79.4 kg)  Height: 5\' 1"  (1.549 m)   Filed Weights   08/23/22 0931  Weight: 175 lb (79.4 kg)       Assessment & Plan:   Problem List  Items Addressed This Visit       Unprioritized   HIV disease (HCC) (Chronic)    Well controlled on Biktarvy since 2018. Prior to that was on long term truvada for years as part of combination therapy. I think given she is postmenopausal, on chronic steroids and has existing osteoporosis making the switch to Dovato for treamtent makes great sense.  Will give her a 2 week sample to start switch until insurance can fill her medication again. Will have her back in 3 months to follow up on the change.       Relevant Medications   dolutegravir-lamiVUDine (DOVATO) 50-300 MG tablet   Chronic night sweats - Primary    Has been present for years now. I don't expect this has anything to do with HIV, infection or undiagnosed malignancy. Most suspect pattern is due to vasomotor symptoms. She is also on nortriptyline chronically and could be due to that.  I  encouraged her to discuss with her IM team at follow up next month if low dose HRT would be of use/benefit to her.  Seems low risk for starting it (no cancer history, CVA/clot history, non-smoker) and may offer benefit to bone health.      Return in about 3 months (around 11/22/2022).   Rexene Alberts, MSN, NP-C [facility] Infectious [facility]  [email or web address] Pager: [phone] Office: [phone] [facility] Main Line: [phone]

## 2022-08-23 NOTE — Assessment & Plan Note (Signed)
Well controlled on Biktarvy since 2018. Prior to that was on long term truvada for years as part of combination therapy. I think given she is postmenopausal, on chronic steroids and has existing osteoporosis making the switch to Dovato for treamtent makes great sense.  Will give her a 2 week sample to start switch until insurance can fill her medication again. Will have her back in 3 months to follow up on the change.

## 2022-08-23 NOTE — Patient Instructions (Addendum)
We will plan to switch you to you to once daily Dovato - can switch at any time. You should not have any side effects with this switch. Will see you back in 3 months to see how this is going.   Good vitamin C on the skin, sunscreen and a retinol - I love the [facility] brand online.   Would talk with Dr. Dalbert Garnet at your follow up about menopause symptoms - you may have some relief with  low dose hormone therapy.

## 2022-08-25 ENCOUNTER — Other Ambulatory Visit: Payer: Self-pay | Admitting: Pharmacist

## 2022-08-25 DIAGNOSIS — H7293 Unspecified perforation of tympanic membrane, bilateral: Secondary | ICD-10-CM | POA: Diagnosis not present

## 2022-08-25 DIAGNOSIS — B20 Human immunodeficiency virus [HIV] disease: Secondary | ICD-10-CM

## 2022-08-25 MED ORDER — DOVATO 50-300 MG PO TABS
1.0000 | ORAL_TABLET | Freq: Every day | ORAL | 0 refills | Status: DC
Start: 2022-08-23 — End: 2022-08-30

## 2022-08-25 NOTE — Progress Notes (Signed)
Medication Samples have been provided to the patient.  Drug name: Dovato        Strength: 50/300 mg         Qty: 14 tablets (2 bottles)   LOT: [id number]   Exp.Date: 07/2023  Dosing instructions: Take one tablet by mouth once daily  The patient has been instructed regarding the correct time, dose, and frequency of taking this medication, including desired effects and most common side effects.   Ryla Cauthon L. Jannette Fogo, PharmD, BCIDP, AAHIVP, CPP Clinical Pharmacist Practitioner Infectious Diseases Clinical Pharmacist [facility] 04/06/2020, 10:07 AM

## 2022-08-29 ENCOUNTER — Other Ambulatory Visit: Payer: Self-pay | Admitting: Orthopedic Surgery

## 2022-08-29 ENCOUNTER — Other Ambulatory Visit: Payer: Self-pay | Admitting: Allergy and Immunology

## 2022-08-29 ENCOUNTER — Other Ambulatory Visit: Payer: Self-pay | Admitting: Student

## 2022-08-29 DIAGNOSIS — I10 Essential (primary) hypertension: Secondary | ICD-10-CM

## 2022-08-30 ENCOUNTER — Encounter: Payer: Self-pay | Admitting: Allergy and Immunology

## 2022-08-30 ENCOUNTER — Ambulatory Visit (INDEPENDENT_AMBULATORY_CARE_PROVIDER_SITE_OTHER): Payer: Medicaid Other | Admitting: Allergy and Immunology

## 2022-08-30 VITALS — BP 122/72 | HR 84 | Temp 98.9°F | Resp 16 | Ht 61.0 in | Wt 173.9 lb

## 2022-08-30 DIAGNOSIS — J4540 Moderate persistent asthma, uncomplicated: Secondary | ICD-10-CM | POA: Diagnosis not present

## 2022-08-30 DIAGNOSIS — L299 Pruritus, unspecified: Secondary | ICD-10-CM | POA: Diagnosis not present

## 2022-08-30 DIAGNOSIS — M8589 Other specified disorders of bone density and structure, multiple sites: Secondary | ICD-10-CM

## 2022-08-30 DIAGNOSIS — J3089 Other allergic rhinitis: Secondary | ICD-10-CM | POA: Diagnosis not present

## 2022-08-30 DIAGNOSIS — K219 Gastro-esophageal reflux disease without esophagitis: Secondary | ICD-10-CM

## 2022-08-30 MED ORDER — PANTOPRAZOLE SODIUM 40 MG PO TBEC: 40.0000 mg | DELAYED_RELEASE_TABLET | Freq: Two times a day (BID) | ORAL | 1 refills | Status: DC

## 2022-08-30 MED ORDER — SYMBICORT 160-4.5 MCG/ACT IN AERO: 2.0000 | INHALATION_SPRAY | Freq: Two times a day (BID) | RESPIRATORY_TRACT | 1 refills | Status: DC

## 2022-08-30 MED ORDER — TRIAMCINOLONE ACETONIDE 55 MCG/ACT NA AERO: 1.0000 | INHALATION_SPRAY | Freq: Two times a day (BID) | NASAL | 1 refills | Status: DC

## 2022-08-30 MED ORDER — ALBUTEROL SULFATE HFA 108 (90 BASE) MCG/ACT IN AERS: 2.0000 | INHALATION_SPRAY | RESPIRATORY_TRACT | 1 refills | Status: DC | PRN

## 2022-08-30 MED ORDER — MONTELUKAST SODIUM 10 MG PO TABS: 10.0000 mg | ORAL_TABLET | Freq: Every evening | ORAL | 1 refills | Status: DC

## 2022-08-30 MED ORDER — NEURONTIN 100 MG PO CAPS: 200.0000 mg | ORAL_CAPSULE | Freq: Three times a day (TID) | ORAL | 1 refills | Status: DC

## 2022-08-30 MED ORDER — FAMOTIDINE 40 MG PO TABS: 40.0000 mg | ORAL_TABLET | Freq: Every evening | ORAL | 1 refills | Status: DC

## 2022-08-30 NOTE — Progress Notes (Unsigned)
[place] [facility] [place] [facility] [place] - [place] - [place]   Follow-up Note  Referring Provider: Evlyn Kanner, MD Primary Provider: Evlyn Kanner, MD Date of Office Visit: 08/30/2022  Subjective:   [name] [name] (DOB: Jul 12, 1960) is a 62 y.o. female who [facility] in re-evaluation of the following:  HPI: [name] [name] saw this clinic in evaluation of asthma, allergic rhinitis, LPR, pruritic disorder.  I last saw her in this clinic 15 March 2022.  Her airway issue has been under excellent control while she uses Symbicort mostly 1 time per day as well as some nasal steroid and montelukast.  She has not required a systemic steroid or antibiotic for any type of airway issue.  She rarely uses a short acting bronchodilator.  She did visit with ENT because she had otitis externa involving her left ear which appeared to be precipitated by the use of a hearing aid that has been introduced over the course of the past 6 weeks.  Fortunately, that issue has cleared up with topical treatment and her hearing aid is dramatically improved her hearing.  Her reflux is under very good control while using Protonix mostly 1 time per day and famotidine in the evening.  Occasionally she will need to use her for the Protonix twice a day.  Her pruritic disorder is under very good control while using gabapentin usually at a dose of 200 mg 3 times per day but occasionally she must use it 4 times per day.  She informs me that she has had a bone density scan which may have identified some thinning bones.  Allergies as of 08/30/2022       Reactions   Acetaminophen Other (See Comments)   Inflamed liver, hospitalized Other Reaction(s): liver demage   Morphine Sulfate Hives, Shortness Of Breath   Triamterene Hives   Aspirin-caffeine Other (See Comments)   liver damage; upset stomach Other Reaction(s): liver demage   Dyazide [hydrochlorothiazide W-triamterene] Hives    Empagliflozin Diarrhea   (Jardiance) stomach ache   Metformin Hcl Er Other (See Comments), Nausea Only   upset stomach   Topamax [topiramate] Other (See Comments)   Vision disturbances.   Citalopram Itching, Rash, Other (See Comments)   Emtricitabine-tenofovir Df Rash   Descovy   Lisinopril Other (See Comments)   Cough    Losartan Nausea Only, Rash   Pt had rash, worsening dizziness, and nausea after starting losartan, which improved after stopping losartan   Triamterene-hctz Rash        Medication List    Accu-Chek Guide test strip Generic drug: glucose blood use to check blood sugar In Vitro Once a day DX: E11.9 for 90 days   amLODipine 10 MG tablet Commonly known as: NORVASC TAKE ONE TABLET BY MOUTH DAILY   atorvastatin 10 MG tablet Commonly known as: LIPITOR TAKE HALF TABLET BY MOUTH DAILY   celecoxib 100 MG capsule Commonly known as: CELEBREX TAKE ONE CAPSULE BY MOUTH TWICE A DAY   cetirizine 10 MG tablet Commonly known as: ZYRTEC Take 1-2 tablets 1-2 times a day for itch   chlorhexidine 0.12 % solution Commonly known as: PERIDEX USE AS DIRECTED 15 MLS IN THE MOUTH OR THROAT TWICE A DAY   cholecalciferol 25 MCG (1000 UNIT) tablet Commonly known as: VITAMIN D3 Take 1 tablet (1,000 Units total) by mouth daily.   ciclopirox 8 % solution Commonly known as: PENLAC APPLY OVER NAIL AND SURROUNDING SKIN AT BEDTIME OVER PREVIOUS COAT.  REMOVE AFTER 7DAYS/REPEAT   Dovato 50-300 MG tablet Generic drug: dolutegravir-lamiVUDine Take 1 tablet by mouth daily.   famotidine 40 MG tablet Commonly known as: PEPCID Take 1 tablet (40 mg total) by mouth at bedtime.   Global Ease Inject Pen Needles 32G X 4 MM Misc Generic drug: Insulin Pen Needle USE AS DIRECTED WITH VICTOZA for 30   hydrochlorothiazide 25 MG tablet Commonly known as: HYDRODIURIL Take 1 tablet (25 mg total) by mouth daily.   hydrocortisone 5 MG tablet Commonly known as: CORTEF Take 2.5-5 mg by  mouth See admin instructions. Take 1 tablet (5 mg) by mouth in the morning (scheduled) & may taken an additional 0.5 tablet (2.5 mg) by mouth in the evening if needed.   liraglutide 18 MG/3ML Sopn Commonly known as: VICTOZA Inject 1.2 mg into the skin every evening.   metoprolol tartrate 50 MG tablet Commonly known as: LOPRESSOR Take 0.5 tablets (25 mg total) by mouth 2 (two) times daily.   montelukast 10 MG tablet Commonly known as: SINGULAIR Take 1 tablet (10 mg total) by mouth at bedtime.   Neurontin 100 MG capsule Generic drug: gabapentin TAKE TWO CAPSULES BY MOUTH THREE TIMES A DAY   nitroGLYCERIN 0.4 MG SL tablet Commonly known as: NITROSTAT Place 1 tablet (0.4 mg total) under the tongue every 5 (five) minutes as needed for chest pain.   nortriptyline 10 MG capsule Commonly known as: PAMELOR Take 2 capsules (20 mg total) by mouth at bedtime.   pantoprazole 40 MG tablet Commonly known as: PROTONIX Take 1 tablet (40 mg total) by mouth 2 (two) times daily.   potassium chloride SA 20 MEQ tablet Commonly known as: KLOR-CON M Take 20 mEq by mouth in the morning.   Restasis 0.05 % ophthalmic emulsion Generic drug: cycloSPORINE Place 1 drop into both eyes 2 (two) times daily.   sertraline 100 MG tablet Commonly known as: Zoloft Take 1 tablet (100 mg total) by mouth daily.   sodium chloride 0.65 % Soln nasal spray Commonly known as: OCEAN PLACE 1 SPRAY INTO BOTH NOSTRILS AS NEEDED FOR CONGESTION.   SUMAtriptan 25 MG tablet Commonly known as: Imitrex Take 1 tablet (25 mg total) by mouth every 2 (two) hours as needed for migraine. May repeat in 2 hours if headache persists or recurs.   Symbicort 160-4.5 MCG/ACT inhaler Generic drug: budesonide-formoterol INHALE TWO PUFFS INTO THE LUNGS IN THE MORNING AND AT BEDTIME   triamcinolone 55 MCG/ACT Aero nasal inhaler Commonly known as: NASACORT Place 1 spray into the nose 2 (two) times daily. 1 spray each nostril 2 times  per day   Ventolin HFA 108 (90 Base) MCG/ACT inhaler Generic drug: albuterol INHALE TWO PUFFS BY MOUTH INTO THE LUNGS EVERY 4 HOURS AS NEEDED FOR WHEEZING OR SHORTNESS OF BREATH    Past Medical History:  Diagnosis Date   Allergic rhinitis 05/09/2006   Allergy    Anxiety    Arthritis    Asthma    CHF (congestive heart failure) (HCC)    Chronic back pain    Diabetes mellitus without complication (HCC)    GERD (gastroesophageal reflux disease)    Heart murmur    as a child   Hepatitis C    genotype 1b, 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 s/p 12 week course of simeprevir and sofosbuvir between October 2014 and January 2015 with resolution.   History of shingles    HIV infection (HCC)    1994   Hyperlipidemia  no meds taken now   Hypertension    Migraine    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   Pneumonia    Prediabetes    Refusal of blood transfusions as patient is Jehovah's Witness    Secondary adrenal insufficiency (HCC) 06/29/2014   Urticaria     Past Surgical History:  Procedure Laterality Date   BUNIONECTOMY     b/l   COLECTOMY     2003 for diverticulitis, had colostomy bag and then reversed   COLONOSCOPY     HAND SURGERY     HAND SURGERY Right    right thumb surgery Dr Mina Marble 2021   NASAL SINUS SURGERY     SHOULDER ARTHROSCOPY WITH OPEN ROTATOR CUFF REPAIR AND DISTAL CLAVICLE ACROMINECTOMY Left 08/31/2021   Procedure: LEFT SHOULDER ARTHROSCOPY, DEBRIDEMENT,  ROTATOR CUFF TEAR REPAIR;  Surgeon: Cammy Copa, MD;  Location: [facility] OR;  Service: Orthopedics;  Laterality: Left;   SHOULDER SURGERY     left   TONSILLECTOMY      Review of systems negative except as noted in HPI / PMHx or noted below:  Review of Systems  Constitutional: Negative.   HENT: Negative.    Eyes: Negative.   Respiratory: Negative.    Cardiovascular: Negative.   Gastrointestinal: Negative.   Genitourinary: Negative.   Musculoskeletal: Negative.   Skin: Negative.    Neurological: Negative.   Endo/Heme/Allergies: Negative.   Psychiatric/Behavioral: Negative.       Objective:   Vitals:   08/30/22 1039  BP: 122/72  Pulse: 84  Resp: 16  Temp: 98.9 F (37.2 C)  SpO2: 96%   Height: 5\' 1"  (154.9 cm)  Weight: 173 lb 14.4 oz (78.9 kg)   Physical Exam Constitutional:      Appearance: She is not diaphoretic.  HENT:     Head: Normocephalic.     Right Ear: External ear normal. Tympanic membrane is perforated.     Left Ear: External ear normal.     Ears:     Comments: Hearing aid left    Nose: Nose normal. No mucosal edema or rhinorrhea.     Mouth/Throat:     Pharynx: Uvula midline. No oropharyngeal exudate.  Eyes:     Conjunctiva/sclera: Conjunctivae normal.  Neck:     Thyroid: No thyromegaly.     Trachea: Trachea normal. No tracheal tenderness or tracheal deviation.  Cardiovascular:     Rate and Rhythm: Normal rate and regular rhythm.     Heart sounds: Normal heart sounds, S1 normal and S2 normal. No murmur heard. Pulmonary:     Effort: No respiratory distress.     Breath sounds: Normal breath sounds. No stridor. No wheezing or rales.  Lymphadenopathy:     Head:     Right side of head: No tonsillar adenopathy.     Left side of head: No tonsillar adenopathy.     Cervical: No cervical adenopathy.  Skin:    Findings: No erythema or rash.     Nails: There is no clubbing.  Neurological:     Mental Status: She is alert.     Diagnostics:    Spirometry was performed and demonstrated an FEV1 of 1.65 at 86 % of predicted.  Results of a bone density scan obtained 23 March 2022 identifies the following:  T-score AP Spine L1-L4 03/23/2022 60.4 -2.3 0.906 g/cm2 * AP Spine L1-L4 01/31/2019 57.3 -2.0 0.940 g/cm2  DualFemur Neck Left 03/23/2022 60.4 -1.7 0.796 g/cm2 DualFemur Neck Left 01/31/2019 57.3 -1.9 0.781 g/cm2  DualFemur Total Mean 03/23/2022 60.4 -1.2 0.861 g/cm2 DualFemur Total Mean 01/31/2019 57.3 -1.1 0.868 g/cm2  Left  Forearm Radius 33% 03/23/2022 60.4 -2.0 0.704 g/cm2  Assessment and Plan:   1. Asthma, moderate persistent, well-controlled   2. Other allergic rhinitis   3. LPRD (laryngopharyngeal reflux disease)   4. Pruritic disorder   5. Osteopenia of multiple sites    1.  Continue to Treat and prevent inflammation:   A.  Montelukast 10 mg - 1 tablet 1 time per day  B.  Symbicort 160 - 2 inhalations 1-2 times per day  C.  Nasacort -1 spray each nostril 1-2 times per day  2.  Continue to Treat and prevent reflux:   A.  Protonix 40 mg 1-2 times per day  B.  Famotidine 40 mg in p.m.    3.  Continue to treat and prevent itchiness:    A. cetirizine 10 mg -1-2 tablets 1-2 times a day   B. Gabapentin 100 mg - 1-2 tablet 3-4 times a day  4.  If needed:   A.  OTC Benadryl  B.  OTC nasal saline  C.  Pro Air HFA 2 puffs every 4-6 hours  D.  Nasal saline  5. Return to clinic in 6 months or earlier if problem  6. Obtain fall flu vaccine  7. Treatment for osteopenia???   From a respiratory standpoint and a reflux standpoint and a pruritic standpoint Destry is doing very well on her current plan of therapy which includes anti-inflammatory medications for airway, proton pump inhibitor and H2 receptor blocker, and a combination of cetirizine and gabapentin, respectively.  I do see no need for changing her therapy at this point in time.  She has a very good understanding of her disease state and how her medications work and she also tends to use the lowest dose of medications required to maintain good control of her issues.  She has documented osteopenia and I have asked her to discuss this issue with her primary care doctor.  Laurette Schimke, MD Allergy / Immunology [facility]

## 2022-08-30 NOTE — Patient Instructions (Addendum)
  1.  Continue to Treat and prevent inflammation:   A.  Montelukast 10 mg - 1 tablet 1 time per day  B.  Symbicort 160 - 2 inhalations 1-2 times per day  C.  Nasacort -1 spray each nostril 1-2 times per day  2.  Continue to Treat and prevent reflux:   A.  Protonix 40 mg 1-2 times per day  B.  Famotidine 40 mg in p.m.    3.  Continue to treat and prevent itchiness:    A. cetirizine 10 mg -1-2 tablets 1-2 times a day   B. Gabapentin 100 mg - 1-2 tablet 3-4 times a day  4.  If needed:   A.  OTC Benadryl  B.  OTC nasal saline  C.  Pro Air HFA 2 puffs every 4-6 hours  D.  Nasal saline  5. Return to clinic in 6 months or earlier if problem  6. Obtain fall flu vaccine  7. Treatment for osteopenia???

## 2022-08-30 NOTE — Progress Notes (Signed)
Internal Medicine Clinic Attending  Case discussed with Dr. Amponsah  at the time of the visit.  We reviewed the resident's history and exam and pertinent patient test results.  I agree with the assessment, diagnosis, and plan of care documented in the resident's note.  

## 2022-08-31 ENCOUNTER — Encounter: Payer: Self-pay | Admitting: Orthopedic Surgery

## 2022-08-31 ENCOUNTER — Ambulatory Visit (INDEPENDENT_AMBULATORY_CARE_PROVIDER_SITE_OTHER): Payer: Medicaid Other | Admitting: Orthopedic Surgery

## 2022-08-31 ENCOUNTER — Encounter: Payer: Self-pay | Admitting: Allergy and Immunology

## 2022-08-31 DIAGNOSIS — M5412 Radiculopathy, cervical region: Secondary | ICD-10-CM | POA: Diagnosis not present

## 2022-08-31 NOTE — Progress Notes (Signed)
Office Visit Note   Patient: [name] [name]           Date of Birth: 1960/09/20           MRN: [id number] Visit Date: 08/31/2022 Requested by: Evlyn Kanner, MD [address] [place],  [place] [address] PCP: Evlyn Kanner, MD  Subjective: Chief Complaint  Patient presents with   Neck - Follow-up    HPI: [name] [name] is a 62 y.o. female who presents to the office reporting improved left arm pain.  Since she was last seen she has had a cervical spine ESI on 08/10/2022.  Overall her neck is better.  The left hand is not as numb.  Still has some soreness in her shoulder which has had prior surgery.  Overall she is improved..                ROS: All systems reviewed are negative as they relate to the chief complaint within the history of present illness.  Patient denies fevers or chills.  Assessment & Plan: Visit Diagnoses:  1. Cervical radiculopathy     Plan: Impression is improvement in left arm radiculopathy following cervical spine ESI.  I think she does have some residual shoulder symptoms but not bad enough yet for any further operative intervention.  For now she is going to call me back when her symptoms start to increase and we can get her set up for another injection in her neck.  Would not do that prophylactically.  Follow-Up Instructions: No follow-ups on file.   Orders:  No orders of the defined types were placed in this encounter.  No orders of the defined types were placed in this encounter.     Procedures: No procedures performed   Clinical Data: No additional findings.  Objective: Vital Signs: There were no vitals taken for this visit.  Physical Exam:  Constitutional: Patient appears well-developed HEENT:  Head: Normocephalic Eyes:EOM are normal Neck: Normal range of motion Cardiovascular: Normal rate Pulmonary/chest: Effort normal Neurologic: Patient is alert Skin: Skin is warm Psychiatric: Patient has normal mood and affect  Ortho Exam: Ortho  exam demonstrates full cervical spine range of motion.  Little bit of crepitus with passive left shoulder range of motion.  5 out of 5 grip EPL FPL interosseous wrist flexion extension bicep triceps and deltoid strength.  She is able to get her left hand up behind her head.  Specialty Comments:  MRI CERVICAL SPINE WITHOUT CONTRAST   TECHNIQUE: Multiplanar, multisequence MR imaging of the cervical spine was performed. No intravenous contrast was administered.   COMPARISON:  None Available.   FINDINGS: Alignment: No static listhesis. Loss of the normal cervical lordosis with straightening.   Vertebrae: No acute fracture, evidence of discitis, or aggressive bone lesion.   Cord: Normal signal and morphology.   Posterior Fossa, vertebral arteries, paraspinal tissues: Posterior fossa demonstrates no focal abnormality. Vertebral artery flow voids are maintained. Paraspinal soft tissues are unremarkable.   Disc levels:   Discs: Mild degenerative disease with disc height loss at C4-5.   C2-3: No significant disc bulge. No neural foraminal stenosis. No central canal stenosis.   C3-4: Tiny central disc protrusion. No foraminal or central canal stenosis.   C4-5: Mild broad-based disc bulge. Bilateral uncovertebral degenerative changes. Mild bilateral foraminal stenosis. No central canal stenosis.   C5-6: No significant disc bulge. No neural foraminal stenosis. No central canal stenosis.   C6-7: Broad-based disc bulge with a central disc protrusion contacting the  ventral cervical spinal cord. No foraminal or central canal stenosis.   C7-T1: Broad-based disc bulge eccentric towards the right. Moderate right and mild left foraminal stenosis. No spinal stenosis.   IMPRESSION: 1. At C4-5 there is a mild broad-based disc bulge. Bilateral uncovertebral degenerative changes. Mild bilateral foraminal stenosis. 2. At C6-7 there is a broad-based disc bulge with a central disc protrusion  contacting the ventral cervical spinal cord. 3. At C7-T1 there is a broad-based disc bulge eccentric towards the right. Moderate right and mild left foraminal stenosis. 4. No acute osseous injury of the cervical spine.     Electronically Signed   By: Elige Ko M.D.   On: 07/28/2022 08:59  Imaging: No results found.   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   Chronic migraine w/o aura w/o status migrainosus, not intractable 08/27/2018   Disorder of left eustachian tube 05/19/2017   Vertigo of central origin 01/23/2017   Eustachian tube dysfunction, bilateral 06/15/2016   Ear pain, right 06/15/2016   Spondylosis of lumbar region without myelopathy or radiculopathy 10/08/2015   BPPV (benign paroxysmal positional vertigo) 10/02/2015   Liver fibrosis (HCC) 12/04/2014   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   Vitamin D deficiency 06/29/2014   Secondary adrenal insufficiency (HCC) 06/29/2014   History of tympanostomy tube placement 02/07/2014   Refusal of blood transfusions as patient is Jehovah's Witness 11/18/2013   Hepatitis C virus infection cured after antiviral drug therapy 03/16/2012   Health care maintenance 12/15/2011   Opioid dependence (HCC) 10/05/2010   HTN (hypertension) 10/05/2010   Hyperlipidemia associated with type 2 diabetes mellitus (HCC) 10/23/2008   HIV disease (HCC) 05/09/2006   Depression 05/09/2006   Allergic rhinitis 05/09/2006   GERD 05/09/2006   Past Medical History:  Diagnosis Date   Allergic rhinitis  05/09/2006   Allergy    Anxiety    Arthritis    Asthma    CHF (congestive heart failure) (HCC)    Chronic back pain    Diabetes mellitus without complication (HCC)    GERD (gastroesophageal reflux disease)    Heart murmur    as a child   Hepatitis C    genotype 1b, 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 s/p 12 week course of simeprevir and sofosbuvir between October 2014 and January 2015 with resolution.   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   Pneumonia    Prediabetes    Refusal of blood transfusions as patient is Jehovah's Witness    Secondary adrenal insufficiency (HCC) 06/29/2014   Urticaria     Family History  Problem Relation Age of Onset   Heart disease Mother    Diabetes Mother    Stroke Mother    Heart disease Father    Stroke Father    Diabetes Father    Hepatitis Sister        hcv   Asthma Sister  Allergic rhinitis Sister    Stroke Other    Colon polyps Brother    Renal Disease Brother    Cancer Sister        lung   Asthma Sister    Allergic rhinitis Sister    Cancer Maternal Aunt    Cancer Maternal Aunt    Colon cancer Neg Hx    Esophageal cancer Neg Hx    Stomach cancer Neg Hx    Rectal cancer Neg Hx    Angioedema Neg Hx    Eczema Neg Hx    Urticaria Neg Hx     Past Surgical History:  Procedure Laterality Date   BUNIONECTOMY     b/l   COLECTOMY     2003 for diverticulitis, had colostomy bag and then reversed   COLONOSCOPY     HAND SURGERY     HAND SURGERY Right    right thumb surgery Dr Mina Marble 2021   NASAL SINUS SURGERY     SHOULDER ARTHROSCOPY WITH OPEN ROTATOR CUFF REPAIR AND DISTAL CLAVICLE ACROMINECTOMY Left 08/31/2021   Procedure: LEFT SHOULDER ARTHROSCOPY, DEBRIDEMENT,  ROTATOR CUFF TEAR REPAIR;  Surgeon: Cammy Copa, MD;  Location: [facility] OR;  Service: Orthopedics;  Laterality: Left;   SHOULDER  SURGERY     left   TONSILLECTOMY     Social History   Occupational History   Occupation: Disabled  Tobacco Use   Smoking status: Former    Years: 20    Types: Cigarettes    Quit date: 04/26/2007    Years since quitting: 15.3   Smokeless tobacco: Never   Tobacco comments:    QUIT 2009  Vaping Use   Vaping Use: Never used  Substance and Sexual Activity   Alcohol use: No    Alcohol/week: 0.0 standard drinks of alcohol   Drug use: Yes    Types: Marijuana    Comment: cannabis in the past   Sexual activity: Not Currently    Partners: Male    Birth control/protection: Condom    Comment: declined condoms

## 2022-09-02 ENCOUNTER — Encounter: Payer: Self-pay | Admitting: Obstetrics and Gynecology

## 2022-09-02 ENCOUNTER — Other Ambulatory Visit: Payer: Medicaid Other | Admitting: Obstetrics and Gynecology

## 2022-09-02 NOTE — Patient Outreach (Signed)
Medicaid Managed Care   Nurse Care Manager Note  09/02/2022 Name:  [name] [name] MRN:  [id number] DOB:  [birth year]  [name] [name] is an 62 y.o. year old female who is a primary patient of Evlyn Kanner, MD.  [facility] Managed Care Coordination team was consulted for assistance with:    Chronic healthcare management needs, DM, HTN, HLD, asthma, GERD, anxiety, osteoarthritis, Addison's disease, rhinitis, osteopenia, pituitary  adenoma  Ms. Gottfried was given information about Medicaid Managed Care Coordination team services today. [name] [name] Patient agreed to services and verbal consent obtained.  Engaged with patient by telephone for follow up visit in response to provider referral for case management and/or care coordination services.   Assessments/Interventions:  Review of past medical history, allergies, medications, health status, including review of consultants reports, laboratory and other test data, was performed as part of comprehensive evaluation and provision of chronic care management services.  SDOH (Social Determinants of Health) assessments and interventions performed: SDOH Interventions    Flowsheet Row Patient Outreach Telephone from 09/02/2022 in [place] [facility] Office Visit from 08/17/2022 in [facility] Patient Outreach Telephone from 07/04/2022 in [place] POPULATION HEALTH DEPARTMENT Office Visit from 06/27/2022 in [facility] Patient Outreach Telephone from 06/03/2022 in [place] POPULATION HEALTH DEPARTMENT Patient Outreach Telephone from 05/06/2022 in [place] POPULATION HEALTH DEPARTMENT  SDOH Interventions        Food Insecurity Interventions Intervention Not Indicated Intervention Not Indicated -- Intervention Not Indicated -- Intervention Not Indicated  Housing Interventions Intervention Not Indicated Intervention Not Indicated -- Intervention Not Indicated -- --  Transportation  Interventions -- -- -- Intervention Not Indicated -- --  Utilities Interventions -- Intervention Not Indicated -- Intervention Not Indicated -- Intervention Not Indicated  Alcohol Usage Interventions -- Intervention Not Indicated (Score <7) Intervention Not Indicated (Score <7) -- -- --  Depression Interventions/Treatment  -- -- -- --  [Provider made aware] -- --  Financial Strain Interventions -- Intervention Not Indicated -- Intervention Not Indicated Intervention Not Indicated --  Physical Activity Interventions -- Intervention Not Indicated -- -- Intervention Not Indicated --  Stress Interventions -- Intervention Not Indicated Intervention Not Indicated -- -- --  Social Connections Interventions -- -- -- Intervention Not Indicated -- --     Care Plan  Allergies  Allergen Reactions   Acetaminophen Other (See Comments)    Inflamed liver, hospitalized  Other Reaction(s): liver demage   Morphine Sulfate Hives and Shortness Of Breath   Triamterene Hives   Aspirin-Caffeine Other (See Comments)    liver damage; upset stomach  Other Reaction(s): liver demage   Dyazide [Hydrochlorothiazide W-Triamterene] Hives   Empagliflozin Diarrhea    (Jardiance) stomach ache   Metformin Hcl Er Other (See Comments) and Nausea Only    upset stomach   Topamax [Topiramate] Other (See Comments)    Vision disturbances.   Citalopram Itching, Rash and Other (See Comments)   Emtricitabine-Tenofovir Df Rash    Descovy   Lisinopril Other (See Comments)    Cough    Losartan Nausea Only and Rash    Pt had rash, worsening dizziness, and nausea after starting losartan, which improved after stopping losartan   Triamterene-Hctz Rash   Medications Reviewed Today     Reviewed by [name], RN (Registered Nurse) on 09/02/22 at [phone]  Med List Status: <None>   Medication Order Taking? Sig Documenting Provider Last Dose Status Informant  ACCU-CHEK GUIDE test strip  [id number] No use to check blood sugar In  Vitro Once a day DX: E11.9 for 90 days [provider] Taking Active   albuterol (VENTOLIN HFA) 108 (90 Base) MCG/ACT inhaler [id number]  Inhale 2 puffs into the lungs every 4 (four) hours as needed for wheezing or shortness of breath. Kozlow, Alvira Philips, MD  Active   amLODipine (NORVASC) 10 MG tablet [id number] No TAKE ONE TABLET BY MOUTH DAILY Evlyn Kanner, MD Taking Active   atorvastatin (LIPITOR) 10 MG tablet [id number] No TAKE HALF TABLET BY MOUTH DAILY Katsadouros, Vasilios, MD Taking Active Self  celecoxib (CELEBREX) 100 MG capsule [id number] No TAKE ONE CAPSULE BY MOUTH TWICE A DAY Magnant, Charles L, PA-C Taking Active   cetirizine (ZYRTEC) 10 MG tablet [id number] No Take 1-2 tablets 1-2 times a day for itch Kozlow, Alvira Philips, MD Taking Active   chlorhexidine (PERIDEX) 0.12 % solution [id number] No USE AS DIRECTED 15 MLS IN THE MOUTH OR THROAT TWICE A DAY Dixon, Gomez Cleverly, NP Taking Active   cholecalciferol (VITAMIN D3) 25 MCG (1000 UNIT) tablet [id number] No Take 1 tablet (1,000 Units total) by mouth daily. Evlyn Kanner, MD Taking Active   ciclopirox [facility]) 8 % solution [id number] No APPLY OVER NAIL AND SURROUNDING SKIN AT BEDTIME OVER PREVIOUS COAT. REMOVE AFTER 7DAYS/REPEAT Vivi Barrack, DPM Taking Active   dolutegravir-lamiVUDine (DOVATO) 50-300 MG tablet [id number] No Take 1 tablet by mouth daily. Blanchard Kelch, NP Taking Active   famotidine (PEPCID) 40 MG tablet [id number]  Take 1 tablet (40 mg total) by mouth at bedtime. Kozlow, Alvira Philips, MD  Active   GLOBAL EASE INJECT PEN NEEDLES 32G X 4 MM MISC [id number] No USE AS DIRECTED WITH VICTOZA for 30 [provider] Taking Active   hydrochlorothiazide (HYDRODIURIL) 25 MG tablet [id number]  TAKE ONE TABLET BY MOUTH DAILY Evlyn Kanner, MD  Active   hydrocortisone (CORTEF) 5 MG tablet [id number] No Take 2.5-5 mg by mouth See admin instructions. Take 1 tablet (5 mg) by mouth in the morning (scheduled) & may taken an  additional 0.5 tablet (2.5 mg) by mouth in the evening if needed. [provider] Taking Active Self           Med Note Clearance Coots, Alanda Slim Mar 24, 2022  2:57 PM) 2.5 mg once daily  liraglutide (VICTOZA) 18 MG/3ML SOPN [id number] No Inject 1.8 mg into the skin every evening. [provider] Taking Active Self  metoprolol tartrate (LOPRESSOR) 50 MG tablet [id number] No Take 0.5 tablets (25 mg total) by mouth 2 (two) times daily. Tereso Newcomer T, PA-C Taking Active   montelukast (SINGULAIR) 10 MG tablet [id number]  Take 1 tablet (10 mg total) by mouth at bedtime. Jessica Priest, MD  Active   NEURONTIN 100 MG capsule [id number]  Take 2 capsules (200 mg total) by mouth 3 (three) times daily. Kozlow, Alvira Philips, MD  Active   nitroGLYCERIN (NITROSTAT) 0.4 MG SL tablet [id number] No Place 1 tablet (0.4 mg total) under the tongue every 5 (five) minutes as needed for chest pain. Leone Brand, NP Taking Active Self  nortriptyline (PAMELOR) 10 MG capsule [id number] No Take 2 capsules (20 mg total) by mouth at bedtime. Levert Feinstein, MD Taking Active   pantoprazole (PROTONIX) 40 MG tablet [id number]  Take 1 tablet (40 mg total) by mouth 2 (two) times daily. Kozlow, Alvira Philips, MD  Active   potassium chloride SA (KLOR-CON) 20 MEQ tablet [id number] No Take 20 mEq by  mouth in the morning. [provider] Taking Active Self  RESTASIS 0.05 % ophthalmic emulsion [id number] No Place 1 drop into both eyes 2 (two) times daily. [provider] Taking Active Self  sertraline (ZOLOFT) 100 MG tablet [id number] No Take 1 tablet (100 mg total) by mouth daily. Marrianne Mood, MD Taking Active   sodium chloride (OCEAN) 0.65 % SOLN nasal spray [id number] No PLACE 1 SPRAY INTO BOTH NOSTRILS AS NEEDED FOR CONGESTION. Camelia Phenes, DO Taking Active Self  SUMAtriptan (IMITREX) 25 MG tablet [id number] No Take 1 tablet (25 mg total) by mouth every 2 (two) hours as needed for migraine. May repeat in 2  hours if headache persists or recurs. Levert Feinstein, MD Taking Active Self  SYMBICORT 160-4.5 MCG/ACT inhaler [id number]  Inhale 2 puffs into the lungs 2 (two) times daily. INHALE TWO PUFFS INTO THE LUNGS IN THE MORNING AND AT BEDTIME Kozlow, Alvira Philips, MD  Active   triamcinolone (NASACORT) 55 MCG/ACT AERO nasal inhaler [id number]  Place 1 spray into the nose 2 (two) times daily. 1 spray each nostril 2 times per day Jessica Priest, MD  Active            Patient Active Problem List   Diagnosis Date Noted   Chronic night sweats 08/23/2022   Otitis externa of left ear 08/17/2022   Osteoporosis 03/15/2022   Anxiety 03/15/2022   Prediabetes 02/18/2022   Synovitis of left shoulder    Impingement syndrome of left shoulder 07/01/2021   Grief reaction 06/08/2020   Generalized anxiety disorder with panic attacks 06/20/2019   Osteoarthritis of thumb, right 11/29/2018   Chronic migraine w/o aura w/o status migrainosus, not intractable 08/27/2018   Disorder of left eustachian tube 05/19/2017   Vertigo of central origin 01/23/2017   Eustachian tube dysfunction, bilateral 06/15/2016   Ear pain, right 06/15/2016   Spondylosis of lumbar region without myelopathy or radiculopathy 10/08/2015   BPPV (benign paroxysmal positional vertigo) 10/02/2015   Liver fibrosis (HCC) 12/04/2014   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   Vitamin D deficiency 06/29/2014   Secondary adrenal insufficiency (HCC) 06/29/2014   History of tympanostomy tube placement 02/07/2014   Refusal of blood transfusions as patient is Jehovah's Witness 11/18/2013   Hepatitis C virus infection cured after antiviral drug therapy 03/16/2012   Health care maintenance 12/15/2011   Opioid dependence (HCC) 10/05/2010   HTN (hypertension) 10/05/2010   Hyperlipidemia associated with type 2 diabetes mellitus (HCC) 10/23/2008   HIV disease (HCC) 05/09/2006   Depression 05/09/2006   Allergic  rhinitis 05/09/2006   GERD 05/09/2006   Conditions to be addressed/monitored per PCP order:  Chronic healthcare management needs, DM, HTN, HLD, asthma, GERD, anxiety, osteoarthritis, Addison's disease, rhinitis, osteopenia, pituitary  adenoma  Care Plan : General Plan of Care (Adult)  Updates made by [name], RN since 09/02/2022 12:00 AM     Problem: Health Promotion or Disease Self-Management (General Plan of Care)   Priority: Medium  Onset Date: 08/10/2020     Long-Range Goal: Self-Management Plan Developed   Start Date: 05/07/2020  Expected End Date: 12/03/2022  Recent Progress: On track  Priority: Medium  Note:   Current Barriers:  Chronic Disease Management support and education needs related to DM, HTN, asthma, GERD, anxiety, osteoarthritis, HLD, HIV 09/02/22:  Patient doing well today, decreased pain in shoulder after injection.  Patient has hearing aids now.  Up to date on all appts.  Victoza increased due  to increase in A1C.  BP stable.  Zoloft increased and is helpful.  Changing to Dovato.  Nurse Case Manager Clinical Goal(s):  Over the next 30 days, patient will attend all scheduled medical appointments  Interventions:  Inter-disciplinary care team collaboration (see longitudinal plan of care) Evaluation of current treatment plan and patient's adherence to plan as established by provider. Reviewed medications with patient. Collaborated with pharmacy regarding medications. Discussed plans with patient for ongoing care management follow up and provided patient with direct contact information for care management team Reviewed scheduled/upcoming provider appointments. Pharmacy referral for medication review Patient provided with [facility] transportation information.  Asthma: (Status:New goal.) Long Term Goal Provided education about and advised patient to utilize infection prevention strategies to reduce risk of respiratory infection Discussed the importance of adequate rest  and management of fatigue with Asthma Assessed social determinant of health barriers   Diabetes Interventions:  (Status:  New goal.) Long Term Goal Assessed patient's understanding of A1c goal: <7% Reviewed medications with patient and discussed importance of medication adherence Counseled on importance of regular laboratory monitoring as prescribed Discussed plans with patient for ongoing care management follow up and provided patient with direct contact information for care management team Reviewed scheduled/upcoming provider appointments Review of patient status, including review of consultants reports, relevant laboratory and other test results, and medications completed Assessed social determinant of health barriers Lab Results  Component Value Date   HGBA1C 6.4 (A) 02/18/2022       HGBA1C                                                      7.1                                    08/05/22  Hyperlipidemia Interventions:  (Status:  New goal.) Long Term Goal Medication review performed; medication list updated in electronic medical record.  Provider established cholesterol goals reviewed Counseled on importance of regular laboratory monitoring as prescribed Assessed social determinant of health barriers   Hypertension Interventions:  (Status:  New goal.) Long Term Goal Last practice recorded BP readings:  BP Readings from Last 3 Encounters:  06/02/22 (!) 143/86  03/31/22 (!) 146/88  03/24/22 136/84  07/03/22        137/80. 09/02/22        123/70  Most recent eGFR/CrCl:  Lab Results  Component Value Date   EGFR 73 02/08/2022    No components found for: "CRCL"  Evaluation of current treatment plan related to hypertension self management and patient's adherence to plan as established by provider Reviewed medications with patient and discussed importance of compliance Discussed plans with patient for ongoing care management follow up and provided patient with direct contact  information for care management team Advised patient, providing education and rationale, to monitor blood pressure daily and record, calling PCP for findings outside established parameters Reviewed scheduled/upcoming provider appointments  Assessed social determinant of health barriers   Patient Goals/Self-Care Activities Over the next 30 days, patient will:  -Attends all scheduled provider appointments Calls pharmacy for medication refills Calls provider office for new concerns or questions  Follow Up Plan: The Managed Medicaid care management team will reach out to the patient again over the next 90  business  days.  The patient has been provided with contact information for the Managed Medicaid care management team and has been advised to call with any health related questions or concerns.    Follow Up:  Patient agrees to Care Plan and Follow-up.  Plan: The Managed Medicaid care management team will reach out to the patient again over the next 90 business  days. and The  Patient has been provided with contact information for the Managed Medicaid care management team and has been advised to call with any health related questions or concerns.  Date/time of next scheduled RN care management/care coordination outreach: 12/02/22 at 0900

## 2022-09-02 NOTE — Patient Instructions (Signed)
Hi Ms. Zingg, nice to speak with you today-have a great day and weekend!!  Ms. Ladd was given information about Medicaid Managed Care team care coordination services as a part of their [facility] Community Plan Medicaid benefit. [name] verbally consented to engagement with the [facility] Managed Care team.   If you are experiencing a medical emergency, please call 911 or report to your local emergency department or urgent care.   If you have a non-emergency medical problem during routine business hours, please contact your provider's office and ask to speak with a nurse.   For questions related to your [facility], please call: [phone] or visit the homepage here: [email or web address]  If you would like to schedule transportation through your [facility], please call the following number at least 2 days in advance of your appointment: [phone]   Rides for urgent appointments can also be made after hours by calling Member Services.  Call the Behavioral Health Crisis Line at [phone], at any time, 24 hours a day, 7 days a week. If you are in danger or need immediate medical attention call 911.  If you would like help to quit smoking, call 1-800-QUIT-NOW (530-348-1313) OR Espaol: 1-855-Djelo-Ya (1-324-401-0272) o para ms informacin haga clic aqu or Text READY to 536-644 to register via text  Ms. Weaber - following are the goals we discussed in your visit today:   Goals Addressed             This Visit's Progress    Protect My Health       Timeframe:  Long-Range Goal Priority:  Medium Start Date:      05/07/20                       Expected End Date: ongoing             Follow Up Date: 12/02/22 - schedule appointment for vaccines needed due to my age or health - schedule recommended health tests (blood work, mammogram, colonoscopy, pap test) - schedule  and keep appointment for annual check-up    Why is this important?   Screening tests can find diseases early when they are easier to treat.  Your doctor or nurse will talk with you about which tests are important for you.  Getting shots for common diseases like the flu and shingles will help prevent them.   09/02/22:  patient up to date on all appointments   Patient verbalizes understanding of instructions and care plan provided today and agrees to view in MyChart. Active MyChart status and patient understanding of how to access instructions and care plan via MyChart confirmed with patient.     The Managed Medicaid care management team will reach out to the patient again over the next 90 business  days.  The  Patient  has been provided with contact information for the Managed Medicaid care management team and has been advised to call with any health related questions or concerns.   [name] RN, BSN [place]  [facility] Care Management Coordinator - Managed Medicaid High Risk [phone]   Following is a copy of your plan of care:  Care Plan : General Plan of Care (Adult)  Updates made by [name], RN since 09/02/2022 12:00 AM     Problem: Health Promotion or Disease Self-Management (General Plan of Care)   Priority: Medium  Onset Date: 08/10/2020     [facility]  Goal: Self-Management Plan Developed   Start Date: 05/07/2020  Expected End Date: 12/03/2022  Recent Progress: On track  Priority: Medium  Note:   Current Barriers:  Chronic Disease Management support and education needs related to DM, HTN, asthma, GERD, anxiety, osteoarthritis, HLD, HIV 09/02/22:  Patient doing well today, decreased pain in shoulder after injection.  Patient has hearing aids now.  Up to date on all appts.  Victoza increased due to increase in A1C.  BP stable.  Zoloft increased and is helpful.  Changing to Dovato.  Nurse Case Manager Clinical Goal(s):  Over the next 30 days, patient  will attend all scheduled medical appointments  Interventions:  Inter-disciplinary care team collaboration (see longitudinal plan of care) Evaluation of current treatment plan and patient's adherence to plan as established by provider. Reviewed medications with patient. Collaborated with pharmacy regarding medications. Discussed plans with patient for ongoing care management follow up and provided patient with direct contact information for care management team Reviewed scheduled/upcoming provider appointments. Pharmacy referral for medication review Patient provided with [facility] transportation information.  Asthma: (Status:New goal.) Long Term Goal Provided education about and advised patient to utilize infection prevention strategies to reduce risk of respiratory infection Discussed the importance of adequate rest and management of fatigue with Asthma Assessed social determinant of health barriers   Diabetes Interventions:  (Status:  New goal.) Long Term Goal Assessed patient's understanding of A1c goal: <7% Reviewed medications with patient and discussed importance of medication adherence Counseled on importance of regular laboratory monitoring as prescribed Discussed plans with patient for ongoing care management follow up and provided patient with direct contact information for care management team Reviewed scheduled/upcoming provider appointments Review of patient status, including review of consultants reports, relevant laboratory and other test results, and medications completed Assessed social determinant of health barriers Lab Results  Component Value Date   HGBA1C 6.4 (A) 02/18/2022       HGBA1C                                                      7.1                                    08/05/22  Hyperlipidemia Interventions:  (Status:  New goal.) Long Term Goal Medication review performed; medication list updated in electronic medical record.  Provider established cholesterol  goals reviewed Counseled on importance of regular laboratory monitoring as prescribed Assessed social determinant of health barriers   Hypertension Interventions:  (Status:  New goal.) Long Term Goal Last practice recorded BP readings:  BP Readings from Last 3 Encounters:  06/02/22 (!) 143/86  03/31/22 (!) 146/88  03/24/22 136/84  07/03/22        137/80. 09/02/22        123/70  Most recent eGFR/CrCl:  Lab Results  Component Value Date   EGFR 73 02/08/2022    No components found for: "CRCL"  Evaluation of current treatment plan related to hypertension self management and patient's adherence to plan as established by provider Reviewed medications with patient and discussed importance of compliance Discussed plans with patient for ongoing care management follow up and provided patient with direct contact information for care management team Advised patient, providing education and rationale, to monitor  blood pressure daily and record, calling PCP for findings outside established parameters Reviewed scheduled/upcoming provider appointments  Assessed social determinant of health barriers   Patient Goals/Self-Care Activities Over the next 30 days, patient will:  -Attends all scheduled provider appointments Calls pharmacy for medication refills Calls provider office for new concerns or questions  Follow Up Plan: The Managed Medicaid care management team will reach out to the patient again over the next 90 business  days.  The patient has been provided with contact information for the Managed Medicaid care management team and has been advised to call with any health related questions or concerns.

## 2022-09-13 ENCOUNTER — Encounter: Payer: Self-pay | Admitting: Student

## 2022-09-13 ENCOUNTER — Ambulatory Visit: Payer: Medicaid Other | Admitting: Student

## 2022-09-13 ENCOUNTER — Ambulatory Visit: Payer: Medicaid Other | Admitting: Allergy and Immunology

## 2022-09-13 VITALS — BP 131/77 | HR 81 | Temp 98.0°F | Ht 61.0 in | Wt 174.6 lb

## 2022-09-13 DIAGNOSIS — R61 Generalized hyperhidrosis: Secondary | ICD-10-CM

## 2022-09-13 DIAGNOSIS — T380X5D Adverse effect of glucocorticoids and synthetic analogues, subsequent encounter: Secondary | ICD-10-CM | POA: Diagnosis not present

## 2022-09-13 DIAGNOSIS — Z780 Asymptomatic menopausal state: Secondary | ICD-10-CM | POA: Diagnosis not present

## 2022-09-13 DIAGNOSIS — E099 Drug or chemical induced diabetes mellitus without complications: Secondary | ICD-10-CM

## 2022-09-13 DIAGNOSIS — B20 Human immunodeficiency virus [HIV] disease: Secondary | ICD-10-CM

## 2022-09-13 DIAGNOSIS — M8580 Other specified disorders of bone density and structure, unspecified site: Secondary | ICD-10-CM

## 2022-09-13 LAB — POCT GLYCOSYLATED HEMOGLOBIN (HGB A1C): Hemoglobin A1C: 6.5 % — AB (ref 4.0–5.6)

## 2022-09-13 LAB — GLUCOSE, CAPILLARY: Glucose-Capillary: 141 mg/dL — ABNORMAL HIGH (ref 70–99)

## 2022-09-13 MED ORDER — NEURONTIN 100 MG PO CAPS: ORAL_CAPSULE | ORAL | 1 refills | Status: DC

## 2022-09-13 NOTE — Assessment & Plan Note (Signed)
Most recent DEXA scan showing T-score of -2.3 putting her in the osteopenia category.  Will update vitamin D today.  Plan: -Vitamin D pending -Depending on what level will increase vitamin D dosage  Addendum: Vitamin D level was low, will increase vitamin D supplementation.

## 2022-09-13 NOTE — Assessment & Plan Note (Signed)
Patient is followed by Rexene Alberts at the [facility].  Patient is planning to switch from [place] to [facility].  Rexene Alberts will be overseeing this.  Patient reports compliance with the medication.  Patient recently had office visit and labs updated.  Plan: -Patient has 1 more week of Biktarvy left. -After finishing Biktarvy will switch to Dovato

## 2022-09-13 NOTE — Progress Notes (Signed)
CC: Follow-up appointment  HPI:  [name] [name] is a 62 y.o. female with a past medical history of migraines, hypertension, GERD, hyperlipidemia, steroid induced diabetes presenting for follow-up appointment.  Please see assessment and plan for full HPI  Most recent office visit was on 08/17/2022 in which patient had concerns of ear drainage and was diagnosed with otitis externa.  Patient also was to follow-up with ENT on 05/2.  Patient also had telehealth visit on 08/18/2019: Patient was seen for anxiety and sertraline was increased to 100 mg daily.  Hypertension: Amlodipine 10 mg daily, hydrochlorothiazide 25 mg daily, metoprolol tartrate 25 mg twice daily Asthma: Albuterol, Symbicort Hyperlipidemia: Lipitor 10 mg daily Vitamin D deficiency: Vitamin D 1000 units daily HIV: Dovato 50-300 mg tablet daily GERD Pepcid 40 mg daily Protonix 40 mg Type 2 diabetes: Victoza 1.8 mg daily Diabetic neuropathy: Neurontin 200 mg 3 times daily Migraines: Imitrex 25 mg as needed Anxiety Zoloft 100 mg daily, amitriptyline 20 mg daily  Past Medical History:  Diagnosis Date   Allergic rhinitis 05/09/2006   Allergy    Anxiety    Arthritis    Asthma    CHF (congestive heart failure) (HCC)    Chronic back pain    Diabetes mellitus without complication (HCC)    GERD (gastroesophageal reflux disease)    Heart murmur    as a child   Hepatitis C    genotype 1b, 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 s/p 12 week course of simeprevir and sofosbuvir between October 2014 and January 2015 with resolution.   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   Pneumonia    Prediabetes    Refusal of blood transfusions as patient is Jehovah's Witness    Secondary adrenal insufficiency (HCC) 06/29/2014   Urticaria      Current Outpatient Medications:    ACCU-CHEK GUIDE test  strip, use to check blood sugar In Vitro Once a day DX: E11.9 for 90 days, Disp: , Rfl:    albuterol (VENTOLIN HFA) 108 (90 Base) MCG/ACT inhaler, Inhale 2 puffs into the lungs every 4 (four) hours as needed for wheezing or shortness of breath., Disp: 18 g, Rfl: 1   amLODipine (NORVASC) 10 MG tablet, TAKE ONE TABLET BY MOUTH DAILY, Disp: 90 tablet, Rfl: 3   atorvastatin (LIPITOR) 10 MG tablet, TAKE HALF TABLET BY MOUTH DAILY, Disp: 90 tablet, Rfl: 10   celecoxib (CELEBREX) 100 MG capsule, TAKE ONE CAPSULE BY MOUTH TWICE A DAY, Disp: 60 capsule, Rfl: 0   cetirizine (ZYRTEC) 10 MG tablet, Take 1-2 tablets 1-2 times a day for itch, Disp: 120 tablet, Rfl: 5   chlorhexidine (PERIDEX) 0.12 % solution, USE AS DIRECTED 15 MLS IN THE MOUTH OR THROAT TWICE A DAY, Disp: 473 mL, Rfl: 1   cholecalciferol (VITAMIN D3) 25 MCG (1000 UNIT) tablet, Take 3 tablets (3,000 Units total) by mouth daily., Disp: 90 tablet, Rfl: 11   ciclopirox (PENLAC) 8 % solution, APPLY OVER NAIL AND SURROUNDING SKIN AT BEDTIME OVER PREVIOUS COAT. REMOVE AFTER 7DAYS/REPEAT, Disp: 6.6 mL, Rfl: 0   dolutegravir-lamiVUDine (DOVATO) 50-300 MG tablet, Take 1 tablet by mouth daily., Disp: 30 tablet, Rfl: 11   famotidine (PEPCID) 40 MG tablet, Take 1 tablet (40 mg total) by mouth at bedtime., Disp: 90 tablet, Rfl: 1  GLOBAL EASE INJECT PEN NEEDLES 32G X 4 MM MISC, USE AS DIRECTED WITH VICTOZA for 30, Disp: , Rfl:    hydrochlorothiazide (HYDRODIURIL) 25 MG tablet, TAKE ONE TABLET BY MOUTH DAILY, Disp: 90 tablet, Rfl: 2   hydrocortisone (CORTEF) 5 MG tablet, Take 2.5-5 mg by mouth See admin instructions. Take 1 tablet (5 mg) by mouth in the morning (scheduled) & may taken an additional 0.5 tablet (2.5 mg) by mouth in the evening if needed., Disp: , Rfl:    liraglutide (VICTOZA) 18 MG/3ML SOPN, Inject 1.8 mg into the skin every evening., Disp: , Rfl:    metoprolol tartrate (LOPRESSOR) 50 MG tablet, Take 0.5 tablets (25 mg total) by mouth 2 (two)  times daily., Disp: 90 tablet, Rfl: 3   montelukast (SINGULAIR) 10 MG tablet, Take 1 tablet (10 mg total) by mouth at bedtime., Disp: 90 tablet, Rfl: 1   NEURONTIN 100 MG capsule, Please take 2 (200 mg) capsules in morning, 2 (200 mg) capsules in afternoon, and 4 (400mg ) capsules at bedtime, Disp: 180 capsule, Rfl: 1   nitroGLYCERIN (NITROSTAT) 0.4 MG SL tablet, Place 1 tablet (0.4 mg total) under the tongue every 5 (five) minutes as needed for chest pain., Disp: 25 tablet, Rfl: 1   nortriptyline (PAMELOR) 10 MG capsule, Take 2 capsules (20 mg total) by mouth at bedtime., Disp: 180 capsule, Rfl: 3   pantoprazole (PROTONIX) 40 MG tablet, Take 1 tablet (40 mg total) by mouth 2 (two) times daily., Disp: 180 tablet, Rfl: 1   potassium chloride SA (KLOR-CON) 20 MEQ tablet, Take 20 mEq by mouth in the morning., Disp: , Rfl:    RESTASIS 0.05 % ophthalmic emulsion, Place 1 drop into both eyes 2 (two) times daily., Disp: , Rfl:    sertraline (ZOLOFT) 100 MG tablet, Take 1 tablet (100 mg total) by mouth daily., Disp: 90 tablet, Rfl: 3   sodium chloride (OCEAN) 0.65 % SOLN nasal spray, PLACE 1 SPRAY INTO BOTH NOSTRILS AS NEEDED FOR CONGESTION., Disp: 3 Bottle, Rfl: 0   SUMAtriptan (IMITREX) 25 MG tablet, Take 1 tablet (25 mg total) by mouth every 2 (two) hours as needed for migraine. May repeat in 2 hours if headache persists or recurs., Disp: 12 tablet, Rfl: 6   SYMBICORT 160-4.5 MCG/ACT inhaler, Inhale 2 puffs into the lungs 2 (two) times daily. INHALE TWO PUFFS INTO THE LUNGS IN THE MORNING AND AT BEDTIME, Disp: 30.6 g, Rfl: 1   triamcinolone (NASACORT) 55 MCG/ACT AERO nasal inhaler, Place 1 spray into the nose 2 (two) times daily. 1 spray each nostril 2 times per day, Disp: 50.7 g, Rfl: 1  Review of Systems:    Constitutional: Patient endorses night sweats  Physical Exam:  Vitals:   09/13/22 1355  BP: 131/77  Pulse: 81  Temp: 98 F (36.7 C)  TempSrc: Oral  SpO2: 96%  Weight: 174 lb 9.6 oz (79.2  kg)  Height: 5\' 1"  (1.549 m)   General: Patient is sitting comfortably in the room  Head: Normocephalic, atraumatic  Cardio: Regular rate and rhythm, no murmurs, rubs or gallops. 2+ pulses to bilateral upper and lower extremities  Pulmonary: Clear to ausculation bilaterally with no rales, rhonchi, and crackles  Abdomen: Soft, nontender with normoactive bowel sounds with no rebound or guarding    Assessment & Plan:   Chronic night sweats Patient states she has been having night sweats for the past 10 years.  She denies any unintentional weight loss.  She states that she can  feel a surge of heat come on around 9 PM, and then is sweating by 10 PM.  She states it has been happening for years.  Only happens at nighttime.  She has no concerns during the daytime.  She states that she thinks it may be related to her HIV medication.  Patient states nothing has made this better or worse.  She denies any fever.  Patient has not had any recent medication changes, but is planning to switch from [place] to [facility].  Etiology is less likely infectious at this time.  I do review chart for patient being up-to-date on cancer screenings with patient is except for Pap smear.  I think likely this is more related to vasomotor symptoms.  I did offer her a couple options to the patient on how to proceed, and patient ultimately chose increasing Neurontin.  Patient not a candidate for estrogen replacement therapy.  Plan: -Increase Neurontin to 400 at night.  Continue with 200 during the day. -If this does not work, plan to titrate off Zoloft, and start affect sore -Labs pending, TSH, CBC with differential, LFTs  Addendum: -TSH within normal limits.  CBC within normal limits.  LFTs within normal limits.  Likely chronic night sweats secondary to vasomotor symptoms.  Will continue with same plan.  Osteopenia after menopause Most recent DEXA scan showing T-score of -2.3 putting her in the osteopenia category.  Will update  vitamin D today.  Plan: -Vitamin D pending -Depending on what level will increase vitamin D dosage  Addendum: Vitamin D level was low, will increase vitamin D supplementation.  HIV disease [facility]) Patient is followed by Rexene Alberts at the [facility].  Patient is planning to switch from [place] to [facility].  Rexene Alberts will be overseeing this.  Patient reports compliance with the medication.  Patient recently had office visit and labs updated.  Plan: -Patient has 1 more week of Biktarvy left. -After finishing Biktarvy will switch to Dovato  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HCC) Patient has been doing well on dosage of Victoza 1.8 mg daily.  She has no concerns about this.  She states she is doing well.  A1c today 6.5.  This is at goal of less than 7.  Plan: -Continue Victoza 1.8 mg daily  Patient discussed with Dr. Dickie La  Modena Slater, DO PGY-1 Internal Medicine Resident  Pager: [phone]

## 2022-09-13 NOTE — Assessment & Plan Note (Signed)
Patient has been doing well on dosage of Victoza 1.8 mg daily.  She has no concerns about this.  She states she is doing well.  A1c today 6.5.  This is at goal of less than 7.  Plan: -Continue Victoza 1.8 mg daily

## 2022-09-13 NOTE — Assessment & Plan Note (Signed)
Patient states she has been having night sweats for the past 10 years.  She denies any unintentional weight loss.  She states that she can feel a surge of heat come on around 9 PM, and then is sweating by 10 PM.  She states it has been happening for years.  Only happens at nighttime.  She has no concerns during the daytime.  She states that she thinks it may be related to her HIV medication.  Patient states nothing has made this better or worse.  She denies any fever.  Patient has not had any recent medication changes, but is planning to switch from [place] to [facility].  Etiology is less likely infectious at this time.  I do review chart for patient being up-to-date on cancer screenings with patient is except for Pap smear.  I think likely this is more related to vasomotor symptoms.  I did offer her a couple options to the patient on how to proceed, and patient ultimately chose increasing Neurontin.  Patient not a candidate for estrogen replacement therapy.  Plan: -Increase Neurontin to 400 at night.  Continue with 200 during the day. -If this does not work, plan to titrate off Zoloft, and start affect sore -Labs pending, TSH, CBC with differential, LFTs  Addendum: -TSH within normal limits.  CBC within normal limits.  LFTs within normal limits.  Likely chronic night sweats secondary to vasomotor symptoms.  Will continue with same plan.

## 2022-09-13 NOTE — Patient Instructions (Addendum)
[name] [name] you for allowing me to take part in your care today.  Here are your instructions.  1. Regarding your night sweats, we can increase your Gabapentin to 200 morning, 200 afternoon, and 400 at night. If this does not work for your sweats we can move to switching you off of your Zoloft and try something else.  2. Please bring this up to your endocrine doctor to see if this is something that he can address too.  3. Regarding your diabetes, it is well controlled keep taking your victoza.   4. I am checking some labs please wait for my phone call to get your results.   5. Please schedule appointment with your doctor to get PAP smear.   Thank you, Dr. Allena Katz  If you have any other questions please contact the internal medicine clinic at [phone]

## 2022-09-14 LAB — HEPATIC FUNCTION PANEL
ALT: 29 IU/L (ref 0–32)
AST: 22 IU/L (ref 0–40)
Albumin: 4.6 g/dL (ref 3.8–4.9)
Alkaline Phosphatase: 82 IU/L (ref 44–121)
Bilirubin Total: 0.2 mg/dL (ref 0.0–1.2)
Bilirubin, Direct: <0.1 mg/dL (ref 0.00–0.40)
Total Protein: 7.2 g/dL (ref 6.0–8.5)

## 2022-09-14 LAB — CBC WITH DIFFERENTIAL/PLATELET
Basophils Absolute: 0 10*3/uL (ref 0.0–0.2)
Basos: 0 %
EOS (ABSOLUTE): 0.1 10*3/uL (ref 0.0–0.4)
Eos: 1 %
Hematocrit: 44.2 % (ref 34.0–46.6)
Hemoglobin: 14.6 g/dL (ref 11.1–15.9)
Immature Grans (Abs): 0 10*3/uL (ref 0.0–0.1)
Immature Granulocytes: 0 %
Lymphocytes Absolute: 2.1 10*3/uL (ref 0.7–3.1)
Lymphs: 24 %
MCH: 27.3 pg (ref 26.6–33.0)
MCHC: 33 g/dL (ref 31.5–35.7)
MCV: 83 fL (ref 79–97)
Monocytes Absolute: 0.8 10*3/uL (ref 0.1–0.9)
Monocytes: 9 %
Neutrophils Absolute: 5.8 10*3/uL (ref 1.4–7.0)
Neutrophils: 66 %
Platelets: 280 10*3/uL (ref 150–450)
RBC: 5.34 x10E6/uL — ABNORMAL HIGH (ref 3.77–5.28)
RDW: 15.4 % (ref 11.7–15.4)
WBC: 8.9 10*3/uL (ref 3.4–10.8)

## 2022-09-14 LAB — TSH: TSH: 0.921 u[IU]/mL (ref 0.450–4.500)

## 2022-09-14 LAB — VITAMIN D 25 HYDROXY (VIT D DEFICIENCY, FRACTURES): Vit D, 25-Hydroxy: 27.5 ng/mL — ABNORMAL LOW (ref 30.0–100.0)

## 2022-09-14 MED ORDER — VITAMIN D 25 MCG (1000 UNIT) PO TABS: 3000.0000 [IU] | ORAL_TABLET | Freq: Every day | ORAL | 11 refills | Status: AC

## 2022-09-14 NOTE — Addendum Note (Signed)
Addended by: Modena Slater on: 09/14/2022 05:34 PM   Modules accepted: Orders

## 2022-09-15 NOTE — Progress Notes (Signed)
Internal Medicine Clinic Attending  Case discussed with Dr. Patel  At the time of the visit.  We reviewed the resident's history and exam and pertinent patient test results.  I agree with the assessment, diagnosis, and plan of care documented in the resident's note.  

## 2022-09-27 ENCOUNTER — Other Ambulatory Visit: Payer: Self-pay | Admitting: Orthopedic Surgery

## 2022-09-27 ENCOUNTER — Encounter: Payer: Self-pay | Admitting: *Deleted

## 2022-09-29 ENCOUNTER — Encounter: Payer: Self-pay | Admitting: Physical Medicine & Rehabilitation

## 2022-09-29 ENCOUNTER — Encounter: Payer: Medicaid Other | Attending: Physical Medicine & Rehabilitation | Admitting: Physical Medicine & Rehabilitation

## 2022-09-29 VITALS — Temp 98.9°F | Ht 61.0 in | Wt 172.0 lb

## 2022-09-29 DIAGNOSIS — M461 Sacroiliitis, not elsewhere classified: Secondary | ICD-10-CM | POA: Diagnosis not present

## 2022-09-29 MED ORDER — IOHEXOL 180 MG/ML  SOLN
3.0000 mL | Freq: Once | INTRAMUSCULAR | Status: AC
  Administered 2022-09-29: 3 mL

## 2022-09-29 MED ORDER — BETAMETHASONE SOD PHOS & ACET 6 (3-3) MG/ML IJ SUSP
6.0000 mg | Freq: Once | INTRAMUSCULAR | Status: AC
Start: 2022-09-29 — End: 2022-09-29
  Administered 2022-09-29: 6 mg via INTRAMUSCULAR

## 2022-09-29 MED ORDER — LIDOCAINE HCL (PF) 2 % IJ SOLN
1.0000 mL | Freq: Once | INTRAMUSCULAR | Status: AC
Start: 2022-09-29 — End: 2022-09-29
  Administered 2022-09-29: 1 mL

## 2022-09-29 MED ORDER — LIDOCAINE HCL 1 % IJ SOLN
4.0000 mL | Freq: Once | INTRAMUSCULAR | Status: AC
Start: 2022-09-29 — End: 2022-09-29
  Administered 2022-09-29: 4 mL

## 2022-09-29 NOTE — Patient Instructions (Signed)
Sacroiliac injection was performed today. A combination of numbing medicine (lidocaine) plus a cortisone medicine (betamethasone) was injected. The injection was done under x-ray guidance. This procedure has been performed to help reduce low back and buttocks pain as well as potentially hip pain. The duration of this injection is variable lasting from hours to  Months. It may repeated if needed. 

## 2022-09-29 NOTE — Progress Notes (Signed)
  PROCEDURE RECORD [facility]   Name: [name] [name] DOB:1960/06/29 MRN: [id number]  Date:09/29/2022  Physician: Claudette Laws, MD    Nurse/CMA: Kaisei Gilbo S  Allergies:  Allergies  Allergen Reactions   Acetaminophen Other (See Comments)    Inflamed liver, hospitalized  Other Reaction(s): liver demage   Morphine Sulfate Hives and Shortness Of Breath   Triamterene Hives   Aspirin-Caffeine Other (See Comments)    liver damage; upset stomach  Other Reaction(s): liver demage   Dyazide [Hydrochlorothiazide W-Triamterene] Hives   Empagliflozin Diarrhea    (Jardiance) stomach ache   Metformin Hcl Er Other (See Comments) and Nausea Only    upset stomach   Topamax [Topiramate] Other (See Comments)    Vision disturbances.   Citalopram Itching, Rash and Other (See Comments)   Emtricitabine-Tenofovir Df Rash    Descovy   Lisinopril Other (See Comments)    Cough    Losartan Nausea Only and Rash    Pt had rash, worsening dizziness, and nausea after starting losartan, which improved after stopping losartan   Triamterene-Hctz Rash    Consent Signed: Yes.    Is patient diabetic? No.  CBG today? 124  Pregnant: No. LMP: No LMP recorded. Patient is postmenopausal. (age 34-55)  Anticoagulants: no Anti-inflammatory: no Antibiotics: no  Procedure: Bilateral Sacroiliac Joint Injection  Position: Prone Start Time: 11:40 am  End Time: 11:46 am  Fluoro Time: 22  RN/CMA Valetta Mole Lee,CMA    Time 11:22 11:49 am    BP 134/86 107/75    Pulse 77 92    Respirations 16 16    O2 Sat 92 93    S/S 6 6    Pain Level 10/10 2/10     D/C home with Kris Hartmann, [name] & O X 3, D/C instructions reviewed, and sits independently.

## 2022-09-29 NOTE — Progress Notes (Signed)
Bilateral sacroiliac injections under fluoroscopic guidance  Indication: Low back and buttocks pain not relieved by medication management and other conservative care.Has had failure of PT and OTC meds.  Hx HIV, Last CD4 Oct 2023  487 Last injection 06/28/22 lasted ~6wks  Informed consent was obtained after describing risks and benefits of the procedure with the patient, this includes bleeding, bruising, infection, paralysis and medication side effects. The patient wishes to proceed and has given written consent. The patient was placed in a prone position. The lumbar and sacral area was marked and prepped with Betadine. A 25-gauge 1-1/2 inch needle was inserted into the skin and subcutaneous tissue and 1 mL of 1% lidocaine was injected into each side. Then a 25-gauge 3 inch spinal needle was inserted under fluoroscopic guidance into the left sacroiliac joint. AP and lateral images were utilized. Omnipaque 180x0.5 mL under live fluoroscopy demonstrated no intravascular uptake. Then a solution containing one ML of 6 mg per mL Celestone in 2 ML of 2% lidocaine MPF was injected x1.5 mL. This same procedure was repeated on the right side using the same needle, injectate, and technique. Patient tolerated the procedure well. Post procedure instructions were given. Please see post procedure form.

## 2022-09-30 ENCOUNTER — Other Ambulatory Visit: Payer: Self-pay | Admitting: Pharmacist

## 2022-09-30 ENCOUNTER — Telehealth: Payer: Self-pay

## 2022-09-30 DIAGNOSIS — R110 Nausea: Secondary | ICD-10-CM

## 2022-09-30 MED ORDER — ONDANSETRON HCL 4 MG PO TABS: 4.0000 mg | ORAL_TABLET | Freq: Three times a day (TID) | ORAL | 0 refills | Status: DC | PRN

## 2022-09-30 NOTE — Progress Notes (Signed)
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 Has tried taking with food and used Pepto-Bismol with ongoing symptoms. Reviewed it should resolve in the next couple weeks. Rx-ed Zofran to [employer]  Margarite Gouge, PharmD, CPP, BCIDP, AAHIVP Clinical Pharmacist Practitioner Infectious Diseases Clinical Pharmacist [facility]

## 2022-09-30 NOTE — Telephone Encounter (Signed)
Patient called office stating she would like prescription to help with nausea. States that since starting Dovato she has been feeling nauseous.  Juanita Laster, RMA

## 2022-10-07 ENCOUNTER — Telehealth: Payer: Self-pay

## 2022-10-07 DIAGNOSIS — M533 Sacrococcygeal disorders, not elsewhere classified: Secondary | ICD-10-CM

## 2022-10-07 NOTE — Telephone Encounter (Signed)
The insurance is requesting some items before they will make a decision on injection.  Current prescription from physician Relevant physician office notes Letter of Medical Necessity Relevant CPT/HCPC codes

## 2022-10-13 ENCOUNTER — Ambulatory Visit: Payer: Medicaid Other | Admitting: Student

## 2022-10-13 VITALS — BP 155/83 | HR 74 | Temp 98.1°F | Ht 61.0 in | Wt 174.8 lb

## 2022-10-13 DIAGNOSIS — R61 Generalized hyperhidrosis: Secondary | ICD-10-CM

## 2022-10-13 DIAGNOSIS — I10 Essential (primary) hypertension: Secondary | ICD-10-CM

## 2022-10-13 DIAGNOSIS — H7202 Central perforation of tympanic membrane, left ear: Secondary | ICD-10-CM | POA: Diagnosis not present

## 2022-10-13 MED ORDER — NITROGLYCERIN 0.4 MG SL SUBL: 0.4000 mg | SUBLINGUAL_TABLET | SUBLINGUAL | 1 refills | Status: AC | PRN

## 2022-10-13 MED ORDER — CARVEDILOL 12.5 MG PO TABS: 12.5000 mg | ORAL_TABLET | Freq: Two times a day (BID) | ORAL | 5 refills | Status: DC

## 2022-10-13 NOTE — Progress Notes (Signed)
CC: Follow up  HPI:  Ms.Jarielys L Hattery is a 62 y.o. female with PMH as below who presents to the clinic for follow up on her chronic medical problems. Please see problem based charting for evaluation, assessment and plan.  Past Medical History:  Diagnosis Date   Allergic rhinitis 05/09/2006   Allergy    Anxiety    Arthritis    Asthma    CHF (congestive heart failure) (HCC)    Chronic back pain    Diabetes mellitus without complication (HCC)    GERD (gastroesophageal reflux disease)    Heart murmur    as a child   Hepatitis C    genotype 1b, 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 s/p 12 week course of simeprevir and sofosbuvir between October 2014 and January 2015 with resolution.   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 Negative for fevers or chills. HEENT: Positive for left ear drainage, pain and chronic hearing loss Respiratory: Negative for shortness of breath Neuro: Positive for occasional headaches.  Negative for dizziness.  Physical Exam: General: Pleasant, well-appearing middle-aged woman. No acute distress. HEENT: Left ear with whitish to yellowish exudate in the middle ear with large perforation in the tympanic membrane. Mild discomfort behind the ear but no tenderness to palpation of the ear itself. Right ear with normal tympanic membrane and no exudate. Cardiac: RRR. No murmurs, rubs or gallops. No LE edema Respiratory: Lungs CTAB. No wheezing or crackles. Skin: Warm, dry and intact without rashes or lesions Extremities: Atraumatic. Full ROM. Palpable radial and DP pulses. Neuro: A&O x 3. Moves all  extremities. Normal sensation to gross touch.  Vitals:   10/13/22 1322 10/13/22 1358  BP: (!) 154/79 (!) 155/83  Pulse: 75 74  Temp: 98.1 F (36.7 C)   TempSrc: Oral   SpO2: 99%   Weight: 174 lb 12.8 oz (79.3 kg)   Height: 5\' 1"  (1.549 m)     Assessment & Plan:   HTN (hypertension) Patient here for follow-up on her blood pressure after her HCTZ was increased from 12.5 to 25 mg during her last office visit 3 months ago. Patient reports she checks her BP at home and SBP is usually in the 130s to 140s. Her blood pressure is still elevated with SBP in the 150s today. She has no acute concerns aside from persistent drainage from her ear. Patient has not tolerated ACE or ARB in the past. I will switch her from metoprolol to Coreg for better blood pressure effects. BMP today shows normal kidney function and no electrolyte abnormalities. Vitals:   10/13/22 1322 10/13/22 1358  BP: (!) 154/79 (!) 155/83   Plan: -Discontinue metoprolol -Start Coreg 12.5 mg twice daily -Continue HCTZ 25 mg daily -Continue amlodipine 10 mg daily -Advised to check BP and HR daily at home and follow-up with a nurse only visit for BP and HR check in 1 week  Central perforation of tympanic membrane of left ear Patient with a history of left ear eustachian tube dysfunction and tympanic membrane perforation presenting for persistent  ear drainage. She was treated for acute diffuse otitis externa 2 months ago with Ciprodex. Patient states the drops helped slightly but did not completely resolve the drainage. She followed up with her ENT doctor 6 weeks ago. Her left ear was suctioned which helped improve the drainage. Patient reports that over the last 1 to 2 weeks, the left ear drainage has worsened so she has not been able to use her hearing aid. She reports mild pain behind her left ear but denies any fevers, chills, or tinnitus. On exam, patient does have a large central perforation of her left tympanic membrane with  some whitish to yellowish exudate in the middle ear. At this point, I am less concerned for an active ear infection and believes patient needs further evaluation by her ENT to address her perforated tympanic membrane. She has already scheduled appointment with them on Monday.  Plan: -Follow-up with ENT as scheduled on 6/24 -Advised to avoid placing foreign objects in left ear  Chronic night sweats Patient reports that her chronic night sweats have improved since increasing her nighttime gabapentin last month. -Continue gabapentin 200 mg in the morning, 200 mg in the afternoon and 400 mg at bedtime   See Encounters Tab for problem based charting.  Patient discussed with Dr.  Oretha Milch, MD, MPH

## 2022-10-13 NOTE — Patient Instructions (Signed)
Thank you, Ms.[name] for allowing [place] to provide your care today. Today we discussed your ear drainage, night sweats and blood pressure.  Making changes to your blood pressure medications.  Check your blood pressure at home and bring the log to next office visit.  Make sure to follow-up with your ENT doctor next week.  I have ordered the following labs for you:  Lab Orders         BMP8+Anion Gap       I will call if any are abnormal. All of your labs can be accessed through "My Chart".  I have ordered the following medication/changed the following medications:  Start carvedilol 12.5 mg twice daily Discontinue metoprolol  My Chart Access: [email or web address]  Please follow-up in 1 week for Nurse only visit  Please make sure to arrive 15 minutes prior to your next appointment. If you arrive late, you may be asked to reschedule.    We look forward to seeing you next time. Please call our clinic at [phone] if you have any questions or concerns. The best time to call is Monday-Friday from 9am-4pm, but there is someone available 24/7. If after hours or the weekend, call the main hospital number and ask for the Internal Medicine Resident On-Call. If you need medication refills, please notify your pharmacy one week in advance and they will send [place] a request.   Thank you for letting us take part in your care. Wishing you the best!  Steffanie Rainwater, MD 10/13/2022, 2:21 PM IM Resident, PGY-3 [facility] Heck 41:10

## 2022-10-14 ENCOUNTER — Encounter: Payer: Self-pay | Admitting: Student

## 2022-10-14 LAB — BMP8+ANION GAP
Anion Gap: 18 mmol/L (ref 10.0–18.0)
BUN/Creatinine Ratio: 16 (ref 12–28)
BUN: 13 mg/dL (ref 8–27)
CO2: 25 mmol/L (ref 20–29)
Calcium: 10 mg/dL (ref 8.7–10.3)
Chloride: 98 mmol/L (ref 96–106)
Creatinine, Ser: 0.79 mg/dL (ref 0.57–1.00)
Glucose: 91 mg/dL (ref 70–99)
Potassium: 3.9 mmol/L (ref 3.5–5.2)
Sodium: 141 mmol/L (ref 134–144)
eGFR: 85 mL/min/{1.73_m2} (ref >59)

## 2022-10-14 NOTE — Assessment & Plan Note (Addendum)
Patient here for follow-up on her blood pressure after her HCTZ was increased from 12.5 to 25 mg during her last office visit 3 months ago. Patient reports she checks her BP at home and SBP is usually in the 130s to 140s. Her blood pressure is still elevated with SBP in the 150s today. She has no acute concerns aside from persistent drainage from her ear. Patient has not tolerated ACE or ARB in the past. I will switch her from metoprolol to Coreg for better blood pressure effects. BMP today shows normal kidney function and no electrolyte abnormalities. Vitals:   10/13/22 1322 10/13/22 1358  BP: (!) 154/79 (!) 155/83   Plan: -Discontinue metoprolol -Start Coreg 12.5 mg twice daily -Continue HCTZ 25 mg daily -Continue amlodipine 10 mg daily -Advised to check BP and HR daily at home and follow-up with a nurse only visit for BP and HR check in 1 week

## 2022-10-14 NOTE — Assessment & Plan Note (Signed)
Patient reports that her chronic night sweats have improved since increasing her nighttime gabapentin last month. -Continue gabapentin 200 mg in the morning, 200 mg in the afternoon and 400 mg at bedtime

## 2022-10-14 NOTE — Assessment & Plan Note (Signed)
Patient with a history of left ear eustachian tube dysfunction and tympanic membrane perforation presenting for persistent ear drainage. She was treated for acute diffuse otitis externa 2 months ago with Ciprodex. Patient states the drops helped slightly but did not completely resolve the drainage. She followed up with her ENT doctor 6 weeks ago. Her left ear was suctioned which helped improve the drainage. Patient reports that over the last 1 to 2 weeks, the left ear drainage has worsened so she has not been able to use her hearing aid. She reports mild pain behind her left ear but denies any fevers, chills, or tinnitus. On exam, patient does have a large central perforation of her left tympanic membrane with some whitish to yellowish exudate in the middle ear. At this point, I am less concerned for an active ear infection and believes patient needs further evaluation by her ENT to address her perforated tympanic membrane. She has already scheduled appointment with them on Monday.  Plan: -Follow-up with ENT as scheduled on 6/24 -Advised to avoid placing foreign objects in left ear

## 2022-10-16 NOTE — Progress Notes (Signed)
Internal Medicine Clinic Attending  Case discussed with Dr. Amponsah  At the time of the visit.  We reviewed the resident's history and exam and pertinent patient test results.  I agree with the assessment, diagnosis, and plan of care documented in the resident's note.  

## 2022-10-17 DIAGNOSIS — H7293 Unspecified perforation of tympanic membrane, bilateral: Secondary | ICD-10-CM | POA: Diagnosis not present

## 2022-10-17 DIAGNOSIS — H9212 Otorrhea, left ear: Secondary | ICD-10-CM | POA: Diagnosis not present

## 2022-10-20 ENCOUNTER — Ambulatory Visit (INDEPENDENT_AMBULATORY_CARE_PROVIDER_SITE_OTHER): Payer: Medicaid Other | Admitting: Infectious Diseases

## 2022-10-20 ENCOUNTER — Encounter: Payer: Self-pay | Admitting: Infectious Diseases

## 2022-10-20 ENCOUNTER — Other Ambulatory Visit: Payer: Self-pay

## 2022-10-20 ENCOUNTER — Other Ambulatory Visit (HOSPITAL_COMMUNITY)
Admission: RE | Admit: 2022-10-20 | Discharge: 2022-10-20 | Disposition: A | Discharge status: Home / Self Care | Payer: Medicaid Other | Source: Ambulatory Visit | Attending: Infectious Diseases | Admitting: Infectious Diseases

## 2022-10-20 VITALS — BP 126/77 | HR 87 | Temp 97.9°F | Resp 16 | Wt 174.6 lb

## 2022-10-20 DIAGNOSIS — N952 Postmenopausal atrophic vaginitis: Secondary | ICD-10-CM

## 2022-10-20 DIAGNOSIS — Z1231 Encounter for screening mammogram for malignant neoplasm of breast: Secondary | ICD-10-CM

## 2022-10-20 DIAGNOSIS — B20 Human immunodeficiency virus [HIV] disease: Secondary | ICD-10-CM | POA: Diagnosis not present

## 2022-10-20 DIAGNOSIS — Z124 Encounter for screening for malignant neoplasm of cervix: Secondary | ICD-10-CM | POA: Diagnosis present

## 2022-10-20 HISTORY — DX: Postmenopausal atrophic vaginitis: N95.2

## 2022-10-20 HISTORY — DX: Encounter for screening for malignant neoplasm of cervix: Z12.4

## 2022-10-20 MED ORDER — ESTROGENS CONJUGATED 0.625 MG/GM VA CREA: 1.0000 | TOPICAL_CREAM | VAGINAL | 11 refills | Status: DC

## 2022-10-20 NOTE — Assessment & Plan Note (Signed)
Normal pelvic exam. Vaginal atrophy noted - topical premarin 2x weekly prescribed.  Cervical brushing collected for cytology and HPV.  Discussed recommended screening interval for women living with HIV disease meant to be lifelong and at an interval of Q1-3 years pending results. Acceptable to space out Q3y with 3 consecutively normal exams. Further recommendations for [name] [name] to follow today's results.  Results will be communicated to the patient via tmychart results.

## 2022-10-20 NOTE — Assessment & Plan Note (Signed)
Genosure archive collected today - will review at follow up in 23m to final determine if cabenuva is a good alternative for tx and if she is still interested to proceed.

## 2022-10-20 NOTE — Assessment & Plan Note (Signed)
Will try 2x weekly applications of premarin cream to help with vaginal symptoms.

## 2022-10-20 NOTE — Patient Instructions (Addendum)
Will try you on some topical vaginal estrogen cream   What is Premarin cream used for? Premarin (conjugated estrogens) Vaginal Cream is used after menopause to treat menopausal changes in and around the vagina and to treat moderate to severe painful intercourse caused by these changes. Please see full Prescribing Information, including BOXED WARNING and Patient Information.  How to use? Remove cap from tube. Lie on back with knees drawn up (or sit on toilet and insert similarly to a tampon).  To deliver medication, gently insert applicator deeply into vagina and press plunger downward to its original position. Screw nozzle end of applicator onto tube.   Will see you back in July as scheduled - your blood test should be back by then to discuss again if cabenuva will be an option for you.

## 2022-10-20 NOTE — Progress Notes (Signed)
   Subjective:    [name], female    DOB: 1961-03-10, 62 y.o.   MRN: [id number]   Chief Complaint  Patient presents with   Follow-up    B20       Brief Narrative:  [name] [name] is a 62 y.o. female with well controlled HIV (VL 36, CD4 800) on Biktarvy since 2018. She is a type 2 diabetic, on chronic prednisone for Addison's disease a h/o pituitary adenoma.   Previous regimens for HIV treatment include Lexiva/Norvir/Truvada, Isentress/Truvada (rashes), Prezcobix/Truvada (rash).  History of treated Hepatitis C (completed 04/2013 with SVR).     HPI:  [name] [name] is here for a pap smear / cervical cancer screen.  She is also due for a mammogram.   Has had night sweats for years - gabapentin has helped curb this for her. She also has vaginal dryness and some pelvic discomfort she has experienced. She is not having sex at present.   She has notice an interesting phenomena when she takes her dovato / morning meds - has some tingling in the back of her neck and feels about 1-2 hours of numbness down her right hand. She takes her dovato in the morning.    Review of Systems     Objective:   Physical Exam Exam conducted with a chaperone present.  Genitourinary:    General: Normal vulva.     Vagina: Normal. No vaginal discharge, erythema or lesions.     Cervix: Normal.    Vitals:   10/20/22 1016  Weight: 174 lb 9.6 oz (79.2 kg)   Filed Weights   10/20/22 1016  Weight: 174 lb 9.6 oz (79.2 kg)       Assessment & Plan:   Problem List Items Addressed This Visit       Unprioritized   HIV disease (HCC) (Chronic)    Genosure archive collected today - will review at follow up in 30m to final determine if cabenuva is a good alternative for tx and if she is still interested to proceed.       Relevant Orders   GenoSure Archive([facility])   Screening for cervical cancer - Primary    Normal pelvic exam. Vaginal atrophy noted - topical premarin 2x weekly prescribed.  Cervical  brushing collected for cytology and HPV.  Discussed recommended screening interval for women living with HIV disease meant to be lifelong and at an interval of Q1-3 years pending results. Acceptable to space out Q3y with 3 consecutively normal exams. Further recommendations for [name] [name] to follow today's results.  Results will be communicated to the patient via tmychart results.        Relevant Orders   Cytology - PAP( [place])   Other Visit Diagnoses     Encounter for screening mammogram for malignant neoplasm of breast       Relevant Orders   MM Digital Screening     No follow-ups on file.   Rexene Alberts, MSN, NP-C [facility] for Infectious Disease [facility]  [name] Pager: [phone] Office: [phone] [facility] Main Line: [phone]

## 2022-10-21 ENCOUNTER — Ambulatory Visit: Payer: Medicaid Other | Admitting: *Deleted

## 2022-10-21 NOTE — Progress Notes (Signed)
    [name] [name] presented today for blood pressure check. Patient is prescribed blood pressure medications and I confirmed that patient did take their blood pressure medication prior to today's appointment. Blood pressure was taken in the usual and appropriate manner using an automated BP cuff.     Vitals:   10/21/22 1217  BP: 136/72      Results of today's visit will be routed to Dr. Daiva Eves for review and further management.   Ordered per Dr. Kirke Corin.  Will have copy of blood pressure brought in by patient placed in chart.

## 2022-10-24 ENCOUNTER — Other Ambulatory Visit: Payer: Self-pay | Admitting: Allergy and Immunology

## 2022-10-24 ENCOUNTER — Other Ambulatory Visit: Payer: Self-pay | Admitting: Orthopedic Surgery

## 2022-10-24 DIAGNOSIS — E099 Drug or chemical induced diabetes mellitus without complications: Secondary | ICD-10-CM

## 2022-10-25 LAB — CYTOLOGY - PAP
Chlamydia: NEGATIVE
Comment: NEGATIVE
Comment: NEGATIVE
Comment: NORMAL
Diagnosis: NEGATIVE
High risk HPV: NEGATIVE
Neisseria Gonorrhea: NEGATIVE

## 2022-10-28 ENCOUNTER — Encounter: Payer: Self-pay | Admitting: Physical Medicine & Rehabilitation

## 2022-11-14 DIAGNOSIS — H7293 Unspecified perforation of tympanic membrane, bilateral: Secondary | ICD-10-CM | POA: Diagnosis not present

## 2022-11-14 DIAGNOSIS — H938X2 Other specified disorders of left ear: Secondary | ICD-10-CM | POA: Diagnosis not present

## 2022-11-15 ENCOUNTER — Ambulatory Visit
Admission: RE | Admit: 2022-11-15 | Discharge: 2022-11-15 | Disposition: A | Discharge status: Home / Self Care | Payer: Medicaid Other | Source: Ambulatory Visit | Attending: Infectious Diseases | Admitting: Infectious Diseases

## 2022-11-15 DIAGNOSIS — Z1231 Encounter for screening mammogram for malignant neoplasm of breast: Secondary | ICD-10-CM | POA: Diagnosis not present

## 2022-11-16 ENCOUNTER — Ambulatory Visit: Payer: Medicaid Other | Admitting: Orthopedic Surgery

## 2022-11-16 ENCOUNTER — Other Ambulatory Visit: Payer: Self-pay

## 2022-11-16 DIAGNOSIS — M19012 Primary osteoarthritis, left shoulder: Secondary | ICD-10-CM

## 2022-11-16 DIAGNOSIS — M25512 Pain in left shoulder: Secondary | ICD-10-CM

## 2022-11-16 MED ORDER — TRAMADOL HCL 50 MG PO TABS: ORAL_TABLET | ORAL | 0 refills | Status: DC

## 2022-11-18 ENCOUNTER — Encounter: Payer: Self-pay | Admitting: Orthopedic Surgery

## 2022-11-18 MED ORDER — METHYLPREDNISOLONE ACETATE 40 MG/ML IJ SUSP
40.0000 mg | INTRAMUSCULAR | Status: AC | PRN
Start: 2022-11-16 — End: 2022-11-16
  Administered 2022-11-16: 40 mg via INTRA_ARTICULAR

## 2022-11-18 MED ORDER — LIDOCAINE HCL 1 % IJ SOLN
5.0000 mL | INTRAMUSCULAR | Status: AC | PRN
Start: 2022-11-16 — End: 2022-11-16
  Administered 2022-11-16: 5 mL

## 2022-11-18 MED ORDER — BUPIVACAINE HCL 0.5 % IJ SOLN
9.0000 mL | INTRAMUSCULAR | Status: AC | PRN
Start: 2022-11-16 — End: 2022-11-16
  Administered 2022-11-16: 9 mL via INTRA_ARTICULAR

## 2022-11-18 NOTE — Progress Notes (Signed)
Office Visit Note   Patient: [name] [name]           Date of Birth: 25-Aug-1960           MRN: [id number] Visit Date: 11/16/2022 Requested by: [name], MD [address] [place],  [place] [address] PCP: [name], MD  Subjective: Chief Complaint  Patient presents with   Left Shoulder - Pain    HPI: [name] [name] is a 62 y.o. female who presents to the office reporting left shoulder pain.  Patient had left shoulder rotator cuff tear repair done some months ago.  Was noted to have some arthritis in the shoulder at that time as well.  Also has a history of cervical radiculopathy.  The patient has had interventions in the neck.  Continues to report shoulder pain..                ROS: All systems reviewed are negative as they relate to the chief complaint within the history of present illness.  Patient denies fevers or chills.  Assessment & Plan: Visit Diagnoses:  1. Left shoulder pain, unspecified chronicity     Plan: Impression is left shoulder pain with arthritis.  Glenohumeral joint injection performed today.  Ultram prescribed.  Patient wants to hold off on any other surgical intervention.  Plan to see her back as needed.  Follow-Up Instructions: No follow-ups on file.   Orders:  Orders Placed This Encounter  Procedures   US Guided Needle Placement - No Linked Charges   Meds ordered this encounter  Medications   traMADol (ULTRAM) 50 MG tablet    Sig: 1 po q 12hrs prn pain    Dispense:  30 tablet    Refill:  0      Procedures: Large Joint Inj: L glenohumeral on 11/16/2022 5:44 PM Indications: diagnostic evaluation, pain and joint swelling Details: 18 G 1.5 in needle, ultrasound-guided posterior approach  Arthrogram: No  Medications: 9 mL bupivacaine 0.5 %; 40 mg methylPREDNISolone acetate 40 MG/ML; 5 mL lidocaine 1 % Outcome: tolerated well, no immediate complications Procedure, treatment alternatives, risks and benefits explained, specific  risks discussed. Consent was given by the patient. Immediately prior to procedure a time out was called to verify the correct patient, procedure, equipment, support staff and site/side marked as required. Patient was prepped and draped in the usual sterile fashion.       Clinical Data: No additional findings.  Objective: Vital Signs: There were no vitals taken for this visit.  Physical Exam:  Constitutional: Patient appears well-developed HEENT:  Head: Normocephalic Eyes:EOM are normal Neck: Normal range of motion Cardiovascular: Normal rate Pulmonary/chest: Effort normal Neurologic: Patient is alert Skin: Skin is warm Psychiatric: Patient has normal mood and affect  Ortho Exam: Ortho exam demonstrates a little bit of coarseness with passive range of motion of that left shoulder at 90 degrees of abduction.  Subscap strength intact.  External rotation strength slightly weaker on the left compared to the right.  Cervical spine range of motion is full.  No paresthesias in the deltoid region on the left-hand side.  Mild AC joint tenderness is present on the left.  Specialty Comments:  MRI CERVICAL SPINE WITHOUT CONTRAST   TECHNIQUE: Multiplanar, multisequence MR imaging of the cervical spine was performed. No intravenous contrast was administered.   COMPARISON:  None Available.   FINDINGS: Alignment: No static listhesis. Loss of the normal cervical lordosis with straightening.   Vertebrae: No acute fracture, evidence of discitis,  or aggressive bone lesion.   Cord: Normal signal and morphology.   Posterior Fossa, vertebral arteries, paraspinal tissues: Posterior fossa demonstrates no focal abnormality. Vertebral artery flow voids are maintained. Paraspinal soft tissues are unremarkable.   Disc levels:   Discs: Mild degenerative disease with disc height loss at C4-5.   C2-3: No significant disc bulge. No neural foraminal stenosis. No central canal stenosis.   C3-4:  Tiny central disc protrusion. No foraminal or central canal stenosis.   C4-5: Mild broad-based disc bulge. Bilateral uncovertebral degenerative changes. Mild bilateral foraminal stenosis. No central canal stenosis.   C5-6: No significant disc bulge. No neural foraminal stenosis. No central canal stenosis.   C6-7: Broad-based disc bulge with a central disc protrusion contacting the ventral cervical spinal cord. No foraminal or central canal stenosis.   C7-T1: Broad-based disc bulge eccentric towards the right. Moderate right and mild left foraminal stenosis. No spinal stenosis.   IMPRESSION: 1. At C4-5 there is a mild broad-based disc bulge. Bilateral uncovertebral degenerative changes. Mild bilateral foraminal stenosis. 2. At C6-7 there is a broad-based disc bulge with a central disc protrusion contacting the ventral cervical spinal cord. 3. At C7-T1 there is a broad-based disc bulge eccentric towards the right. Moderate right and mild left foraminal stenosis. 4. No acute osseous injury of the cervical spine.     Electronically Signed   By: Elige Ko M.D.   On: 07/28/2022 08:59  Imaging: No results found.   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   Chronic migraine w/o aura w/o status migrainosus, not intractable 08/27/2018   Central perforation of tympanic membrane of left ear 12/09/2016   Eustachian tube dysfunction, left 03/09/2016   Spondylosis of lumbar region without myelopathy or radiculopathy 10/08/2015   Liver fibrosis (HCC) 12/04/2014   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   Vitamin D deficiency 06/29/2014   Secondary adrenal insufficiency (HCC) 06/29/2014   History of tympanostomy tube placement 02/07/2014   Hepatitis C virus infection cured after antiviral drug therapy 03/16/2012   Health care maintenance 12/15/2011   HTN (hypertension) 10/05/2010   Hyperlipidemia associated with type 2 diabetes mellitus (HCC) 10/23/2008   HIV disease (HCC) 05/09/2006   Allergic rhinitis 05/09/2006   GERD 05/09/2006   Past Medical History:  Diagnosis Date   Allergic rhinitis 05/09/2006   Allergy    Anxiety    Arthritis    Asthma    CHF (congestive heart failure) (HCC)    Chronic back pain    Diabetes mellitus without complication (HCC)    GERD (gastroesophageal reflux disease)    Heart murmur    as a child   Hepatitis C    genotype 1b, 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 s/p 12 week course of simeprevir and sofosbuvir between October 2014 and January 2015 with resolution.   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   Pneumonia  Prediabetes    Refusal of blood transfusions as patient is Jehovah's Witness    Secondary adrenal insufficiency (HCC) 06/29/2014   Urticaria     Family History  Problem Relation Age of Onset   Heart disease Mother    Diabetes Mother    Stroke Mother    Heart disease Father    Stroke Father    Diabetes Father    Hepatitis Sister        hcv   Asthma Sister    Allergic rhinitis Sister    Stroke Other    Colon polyps Brother    Renal Disease Brother    Cancer Sister        lung   Asthma Sister    Allergic rhinitis Sister    Cancer Maternal Aunt    Cancer Maternal Aunt    Colon cancer Neg Hx    Esophageal cancer Neg Hx    Stomach cancer Neg Hx    Rectal cancer Neg Hx    Angioedema Neg Hx    Eczema Neg Hx    Urticaria Neg Hx      Past Surgical History:  Procedure Laterality Date   BUNIONECTOMY     b/l   COLECTOMY     2003 for diverticulitis, had colostomy bag and then reversed   COLONOSCOPY     HAND SURGERY     HAND SURGERY Right    right thumb surgery Dr Mina Marble 2021   NASAL SINUS SURGERY     SHOULDER ARTHROSCOPY WITH OPEN ROTATOR CUFF REPAIR AND DISTAL CLAVICLE ACROMINECTOMY Left 08/31/2021   Procedure: LEFT SHOULDER ARTHROSCOPY, DEBRIDEMENT,  ROTATOR CUFF TEAR REPAIR;  Surgeon: Cammy Copa, MD;  Location: [facility] OR;  Service: Orthopedics;  Laterality: Left;   SHOULDER SURGERY     left   TONSILLECTOMY     Social History   Occupational History   Occupation: Disabled  Tobacco Use   Smoking status: Former    Current packs/day: 0.00    Types: Cigarettes    Start date: 04/26/1987    Quit date: 04/26/2007    Years since quitting: 15.5   Smokeless tobacco: Never   Tobacco comments:    QUIT 2009  Vaping Use   Vaping status: Never Used  Substance and Sexual Activity   Alcohol use: No    Alcohol/week: 0.0 standard drinks of alcohol   Drug use: Yes    Types: Marijuana    Comment: cannabis in the past   Sexual activity: Not Currently    Partners: Male    Birth control/protection: Condom    Comment: declined condoms

## 2022-11-21 ENCOUNTER — Other Ambulatory Visit: Payer: Self-pay | Admitting: Orthopedic Surgery

## 2022-11-21 ENCOUNTER — Other Ambulatory Visit: Payer: Self-pay | Admitting: Allergy and Immunology

## 2022-11-21 DIAGNOSIS — E099 Drug or chemical induced diabetes mellitus without complications: Secondary | ICD-10-CM

## 2022-11-22 ENCOUNTER — Ambulatory Visit (INDEPENDENT_AMBULATORY_CARE_PROVIDER_SITE_OTHER): Payer: Medicaid Other | Admitting: Infectious Diseases

## 2022-11-22 ENCOUNTER — Encounter: Payer: Self-pay | Admitting: Infectious Diseases

## 2022-11-22 ENCOUNTER — Other Ambulatory Visit: Payer: Self-pay

## 2022-11-22 ENCOUNTER — Telehealth: Payer: Self-pay | Admitting: Pharmacist

## 2022-11-22 ENCOUNTER — Other Ambulatory Visit (HOSPITAL_COMMUNITY): Payer: Self-pay

## 2022-11-22 VITALS — BP 133/84 | HR 78 | Temp 98.1°F | Wt 172.0 lb

## 2022-11-22 DIAGNOSIS — B20 Human immunodeficiency virus [HIV] disease: Secondary | ICD-10-CM

## 2022-11-22 MED ORDER — CABOTEGRAVIR & RILPIVIRINE ER 600 & 900 MG/3ML IM SUER
1.0000 | INTRAMUSCULAR | 6 refills | Status: DC
Start: 2022-11-22 — End: 2023-12-08
  Filled 2022-11-22: qty 6, 56d supply, fill #0
  Filled 2023-02-21: qty 6, 3d supply, fill #0
  Filled 2023-03-01 – 2023-04-11 (×2): qty 6, 3d supply, fill #1
  Filled 2023-06-09: qty 6, 34d supply, fill #2
  Filled 2023-08-08: qty 6, 34d supply, fill #3
  Filled 2023-10-12: qty 6, 34d supply, fill #4

## 2022-11-22 MED ORDER — CABOTEGRAVIR & RILPIVIRINE ER 600 & 900 MG/3ML IM SUER
1.0000 | INTRAMUSCULAR | 1 refills | Status: DC
Start: 2022-11-22 — End: 2023-02-27
  Filled 2022-11-22 (×2): qty 6, 30d supply, fill #0
  Filled 2022-12-15: qty 6, 30d supply, fill #1

## 2022-11-22 MED ORDER — CABOTEGRAVIR & RILPIVIRINE ER 600 & 900 MG/3ML IM SUER
1.0000 | Freq: Once | INTRAMUSCULAR | 0 refills | Status: DC
  Filled 2022-11-22: qty 6, 1d supply, fill #0

## 2022-11-22 NOTE — Progress Notes (Signed)
   Subjective:    [name], female    DOB: [birth year], 61 y.o.   MRN: [id number]   No chief complaint on file.     Brief Narrative:  [name] [name] is a 62 y.o. female with well controlled HIV (VL 36, CD4 800) on Biktarvy since 2018. She is a type 2 diabetic, on chronic prednisone for Addison's disease a h/o pituitary adenoma.   Previous regimens for HIV treatment include Lexiva/Norvir/Truvada, Isentress/Truvada (rashes), Prezcobix/Truvada (rash).  History of treated Hepatitis C (completed 04/2013 with SVR).     HPI:  [name] [name] is here to follow up on Genosure Archive to see if [name] [name] is an option.  She is hepatitis B negative based on previous testing and anxious to get off one of the pills she has to take everyday.    Review of Systems     Objective:   Physical Exam Constitutional:      Appearance: Normal appearance. She is not ill-appearing.  HENT:     Mouth/Throat:     Mouth: Mucous membranes are moist.     Pharynx: Oropharynx is clear.  Eyes:     General: No scleral icterus. Pulmonary:     Effort: Pulmonary effort is normal.  Neurological:     Mental Status: She is oriented to person, place, and time.  Psychiatric:        Mood and Affect: Mood normal.        Thought Content: Thought content normal.    Vitals:   11/22/22 0956  Weight: 172 lb (78 kg)   Filed Weights   11/22/22 0956  Weight: 172 lb (78 kg)       Assessment & Plan:   Problem List Items Addressed This Visit       Unprioritized   HIV disease (HCC) - Primary (Chronic)    Genosure archive w/o any concern to switch to injectable Cabenuva for treatment. We discussed continuing her Dovato everyday until this injection appt. We discussed typical loading and maintenance dose schedule, side effects related to injection sites of 2 injections and importance of appointment adherence. She has been hep b negative in the past but no Ab recently (last neg). Will check hep b surface antibody  next lab visit.   She spent time talking with [name] [name] with our pharmacy team as well. Will get her scheduled at her ability this week or next to start the injectable treatment. Viral load monitoring increased during first 6-63m to ensure she is doing well on treatment after switch.   I will see her back in 5 months timed with her injection.       No follow-ups on file.   [name] Alberts, MSN, NP-C [facility] for Infectious Disease [facility]  [name] Pager: [phone] Office: [phone] [facility] Main Line: [phone]

## 2022-11-22 NOTE — Addendum Note (Signed)
Addended by: Blanchard Kelch on: 11/22/2022 10:25 AM   Modules accepted: Orders

## 2022-11-22 NOTE — Telephone Encounter (Signed)
Met with [name] [name] today to discuss Guinea transition; she is very excited about it. Her recent [employer] is clear of any drug resistance mutations; attached results to media tab. She is approved through [facility] and will start injectable treatment next week with Cassie as I will be out of office.   Counseled that [place] is two separate intramuscular injections in the gluteal muscle on each side for each visit. Explained that the second injection is 30 days after the initial injection then every 2 months thereafter. Discussed the need for viral load monitoring every 2 months for the first 6 months and then periodically afterwards as their provider sees the need. Discussed the rare but significant chance of developing resistance despite compliance. Explained that showing up to injection appointments is very important and warned that if 2 appointments are missed, it will be reassessed by their provider whether they are a good candidate for injection therapy. Counseled on possible side effects associated with the injections such as injection site pain, which is usually mild to moderate in nature, injection site nodules, and injection site reactions. Asked to call the clinic or send me a mychart message if they experience any issues, such as fatigue, nausea, headache, rash, or dizziness. Advised that they can take ibuprofen or tylenol for injection site pain if needed.   Margarite Gouge, PharmD, CPP, BCIDP, AAHIVP Clinical Pharmacist Practitioner Infectious Diseases Clinical Pharmacist [facility] for Infectious Disease

## 2022-11-22 NOTE — Assessment & Plan Note (Addendum)
Genosure archive w/o any concern to switch to injectable Cabenuva for treatment. We discussed continuing her Dovato everyday until this injection appt. We discussed typical loading and maintenance dose schedule, side effects related to injection sites of 2 injections and importance of appointment adherence. She has been hep b negative in the past but no Ab recently (last neg). Will check hep b surface antibody next lab visit.   She spent time talking with Marchelle Folks with our pharmacy team as well. Will get her scheduled at her ability this week or next to start the injectable treatment. Viral load monitoring increased during first 6-26m to ensure she is doing well on treatment after switch.   I will see her back in 5 months timed with her injection.

## 2022-11-23 ENCOUNTER — Ambulatory Visit: Payer: Medicaid Other | Admitting: Orthopedic Surgery

## 2022-11-23 ENCOUNTER — Other Ambulatory Visit: Payer: Self-pay

## 2022-11-25 ENCOUNTER — Telehealth: Payer: Self-pay

## 2022-11-25 NOTE — Telephone Encounter (Signed)
[facility] Patient Advocate Encounter  Patient's medication [name]) have been couriered to [facility] from [employer] and will be administered on the patient next office visit on 11/29/22.  Clearance Coots , CPhT Specialty Pharmacy Patient [facility] for Infectious Disease Phone: [phone] Fax:  [phone]

## 2022-11-29 ENCOUNTER — Ambulatory Visit (INDEPENDENT_AMBULATORY_CARE_PROVIDER_SITE_OTHER): Payer: Medicaid Other | Admitting: Pharmacist

## 2022-11-29 ENCOUNTER — Other Ambulatory Visit: Payer: Self-pay

## 2022-11-29 DIAGNOSIS — B20 Human immunodeficiency virus [HIV] disease: Secondary | ICD-10-CM

## 2022-11-29 MED ORDER — CABOTEGRAVIR & RILPIVIRINE ER 600 & 900 MG/3ML IM SUER
1.0000 | Freq: Once | INTRAMUSCULAR | Status: AC
Start: 2022-11-29 — End: 2022-11-29
  Administered 2022-11-29: 1 via INTRAMUSCULAR

## 2022-11-29 NOTE — Progress Notes (Signed)
HPI: [name] [name] is a 62 y.o. female who presents to the [facility] pharmacy clinic for [place] administration.  Patient Active Problem List   Diagnosis Date Noted   Vaginal atrophy 10/20/2022   Screening for cervical cancer 10/20/2022   Chronic night sweats 08/23/2022   Otitis externa of left ear 08/17/2022   Osteopenia after menopause 03/15/2022   Anxiety 03/15/2022   Prediabetes 02/18/2022   Impingement syndrome of left shoulder 07/01/2021   Grief reaction 06/08/2020   Osteoarthritis of thumb, right 11/29/2018   Chronic migraine w/o aura w/o status migrainosus, not intractable 08/27/2018   Central perforation of tympanic membrane of left ear 12/09/2016   Eustachian tube dysfunction, left 03/09/2016   Spondylosis of lumbar region without myelopathy or radiculopathy 10/08/2015   Liver fibrosis (HCC) 12/04/2014   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   Vitamin D deficiency 06/29/2014   Secondary adrenal insufficiency (HCC) 06/29/2014   History of tympanostomy tube placement 02/07/2014   Hepatitis C virus infection cured after antiviral drug therapy 03/16/2012   Health care maintenance 12/15/2011   HTN (hypertension) 10/05/2010   Hyperlipidemia associated with type 2 diabetes mellitus (HCC) 10/23/2008   HIV disease (HCC) 05/09/2006   Allergic rhinitis 05/09/2006   GERD 05/09/2006    Patient's Medications  New Prescriptions   No medications on file  Previous Medications   ACCU-CHEK GUIDE TEST STRIP    use to check blood sugar In Vitro Once a day DX: E11.9 for 90 days   AMLODIPINE (NORVASC) 10 MG TABLET    TAKE ONE TABLET BY MOUTH DAILY   ATORVASTATIN (LIPITOR) 10 MG TABLET    TAKE HALF TABLET BY MOUTH DAILY   CABOTEGRAVIR & RILPIVIRINE ER (CABENUVA) 600 & 900 MG/3ML INJECTION    Inject 1 kit into the muscle every 8 (eight) weeks.   CABOTEGRAVIR & RILPIVIRINE ER (CABENUVA) 600 & 900 MG/3ML INJECTION    Inject 1 kit into the  muscle every 30 (thirty) days.   CARVEDILOL (COREG) 12.5 MG TABLET    Take 1 tablet (12.5 mg total) by mouth 2 (two) times daily with a meal.   CELECOXIB (CELEBREX) 100 MG CAPSULE    TAKE ONE CAPSULE BY MOUTH TWICE A DAY   CETIRIZINE (ZYRTEC) 10 MG TABLET    Take 1-2 tablets 1-2 times a day for itch   CHLORHEXIDINE (PERIDEX) 0.12 % SOLUTION    USE AS DIRECTED 15 MLS IN THE MOUTH OR THROAT TWICE A DAY   CHOLECALCIFEROL (VITAMIN D3) 25 MCG (1000 UNIT) TABLET    Take 3 tablets (3,000 Units total) by mouth daily.   CICLOPIROX (PENLAC) 8 % SOLUTION    APPLY OVER NAIL AND SURROUNDING SKIN AT BEDTIME OVER PREVIOUS COAT. REMOVE AFTER 7DAYS/REPEAT   CIPROFLOXACIN-DEXAMETHASONE (CIPRODEX) OTIC SUSPENSION    4 drops to affected ear twice daily   CONJUGATED ESTROGENS (PREMARIN) VAGINAL CREAM    Place 1 Applicatorful vaginally 2 (two) times a week.   DIPHENOXYLATE-ATROPINE (LOMOTIL) 2.5-0.025 MG TABLET    Take by mouth 4 (four) times daily as needed for diarrhea or loose stools.   FAMOTIDINE (PEPCID) 40 MG TABLET    Take 1 tablet (40 mg total) by mouth at bedtime.   GLOBAL EASE INJECT PEN NEEDLES 32G X 4 MM MISC    USE AS DIRECTED WITH VICTOZA for 30   HYDROCHLOROTHIAZIDE (HYDRODIURIL) 25 MG TABLET    TAKE ONE TABLET BY MOUTH DAILY   HYDROCORTISONE (CORTEF) 5 MG TABLET  Take 2.5-5 mg by mouth See admin instructions. Take 1 tablet (5 mg) by mouth in the morning (scheduled) & may taken an additional 0.5 tablet (2.5 mg) by mouth in the evening if needed.   LIRAGLUTIDE (VICTOZA) 18 MG/3ML SOPN    Inject 1.8 mg into the skin every evening.   MONTELUKAST (SINGULAIR) 10 MG TABLET    Take 1 tablet (10 mg total) by mouth at bedtime.   NEURONTIN 100 MG CAPSULE    TAKE ONE TO TWO TABLETS BY MOUTH THREE TO FOUR TIMES DAILY   NITROGLYCERIN (NITROSTAT) 0.4 MG SL TABLET    Place 1 tablet (0.4 mg total) under the tongue every 5 (five) minutes as needed for chest pain.   NORTRIPTYLINE (PAMELOR) 10 MG CAPSULE    Take 2  capsules (20 mg total) by mouth at bedtime.   ONDANSETRON (ZOFRAN) 4 MG TABLET    Take 1 tablet (4 mg total) by mouth every 8 (eight) hours as needed for nausea or vomiting.   PANTOPRAZOLE (PROTONIX) 40 MG TABLET    Take 1 tablet (40 mg total) by mouth 2 (two) times daily.   POTASSIUM CHLORIDE SA (KLOR-CON) 20 MEQ TABLET    Take 20 mEq by mouth in the morning.   RESTASIS 0.05 % OPHTHALMIC EMULSION    Place 1 drop into both eyes 2 (two) times daily.   SERTRALINE (ZOLOFT) 100 MG TABLET    Take 1 tablet (100 mg total) by mouth daily.   SODIUM CHLORIDE (OCEAN) 0.65 % SOLN NASAL SPRAY    PLACE 1 SPRAY INTO BOTH NOSTRILS AS NEEDED FOR CONGESTION.   SUMATRIPTAN (IMITREX) 25 MG TABLET    Take 1 tablet (25 mg total) by mouth every 2 (two) hours as needed for migraine. May repeat in 2 hours if headache persists or recurs.   SYMBICORT 160-4.5 MCG/ACT INHALER    Inhale 2 puffs into the lungs 2 (two) times daily. INHALE TWO PUFFS INTO THE LUNGS IN THE MORNING AND AT BEDTIME   TRAMADOL (ULTRAM) 50 MG TABLET    1 po q 12hrs prn pain   TRIAMCINOLONE (NASACORT) 55 MCG/ACT AERO NASAL INHALER    Place 1 spray into the nose 2 (two) times daily. 1 spray each nostril 2 times per day   VENTOLIN HFA 108 (90 BASE) MCG/ACT INHALER    INHALE TWO PUFFS BY MOUTH INTO THE LUNGS EVERY 4 HOURS AS NEEDED FOR WHEEZING OR SHORTNESS OF BREATH  Modified Medications   No medications on file  Discontinued Medications   No medications on file    Allergies: Allergies  Allergen Reactions   Acetaminophen Other (See Comments)    Inflamed liver, hospitalized  Other Reaction(s): liver demage   Morphine Sulfate Hives and Shortness Of Breath   Triamterene Hives   Aspirin-Caffeine Other (See Comments)    liver damage; upset stomach  Other Reaction(s): liver demage   Dyazide [Hydrochlorothiazide W-Triamterene] Hives   Empagliflozin Diarrhea    (Jardiance) stomach ache   Metformin Hcl Er Other (See Comments) and Nausea Only     upset stomach   Topamax [Topiramate] Other (See Comments)    Vision disturbances.   Citalopram Itching, Rash and Other (See Comments)   Emtricitabine-Tenofovir Df Rash    Descovy   Lisinopril Other (See Comments)    Cough    Losartan Nausea Only and Rash    Pt had rash, worsening dizziness, and nausea after starting losartan, which improved after stopping losartan   Triamterene-Hctz Rash  Labs: Lab Results  Component Value Date   HIV1RNAQUANT Not Detected 02/08/2022   HIV1RNAQUANT <20 (H) 05/19/2021   HIV1RNAQUANT 101 (H) 02/02/2021   CD4TABS 487 02/08/2022   CD4TABS 608 02/02/2021   CD4TABS 553 05/21/2020    RPR and STI Lab Results  Component Value Date   LABRPR NON-REACTIVE 02/02/2021   LABRPR NON-REACTIVE 11/13/2019   LABRPR NON-REACTIVE 08/30/2018   LABRPR NON-REACTIVE 11/16/2017   LABRPR NON REAC 07/12/2016    STI Results GC CT  10/20/2022 10:51 AM Negative  Negative   02/02/2021 10:13 AM Negative  Negative   08/30/2018 12:00 AM Negative  Negative   01/11/2018 12:00 AM Negative  Negative   11/16/2017 12:00 AM Negative  Negative   03/25/2011  4:35 PM  NEGATIVE     Hepatitis B Lab Results  Component Value Date   HEPBSAB NEG 12/15/2011   HEPBSAG NEGATIVE 12/15/2011   Hepatitis C Lab Results  Component Value Date   HCVRNAPCRQN <15 NOT DETECTED 02/21/2017   Hepatitis A No results found for: "HAV" Lipids: Lab Results  Component Value Date   CHOL 149 02/18/2022   TRIG 200 (H) 02/18/2022   HDL 40 02/18/2022   CHOLHDL 3.7 02/18/2022   VLDL 44 (H) 07/12/2016   LDLCALC 75 02/18/2022    Current HIV Regimen: Dovato  TARGET DATE: Today - The 6th  Assessment: Jayva presents today for her first initiation injection for Cabenuva. Counseled that [place] is two separate intramuscular injections in the gluteal muscle on each side for each visit. Explained that the second injection is 30 days after the initial injection then every 2 months thereafter.  Discussed the rare but significant chance of developing resistance despite compliance. Explained that showing up to injection appointments is very important and warned that if 2 appointments are missed, it will be reassessed by their provider whether they are a good candidate for injection therapy. Counseled on possible side effects associated with the injections such as injection site pain, which is usually mild to moderate in nature, injection site nodules, and injection site reactions. Asked to call the clinic or send me a mychart message if they experience any issues, such as fatigue, nausea, headache, rash, or dizziness. Advised that they can take ibuprofen or tylenol for injection site pain if needed.   Administered cabotegravir 600mg /76mL in left upper outer quadrant of the gluteal muscle. Administered rilpivirine 900 mg/60mL in the right upper outer quadrant of the gluteal muscle. Monitored patient for 10 minutes after injection. Injections were tolerated well without issue. Counseled to stop taking Dovato after today's dose and to call with any issues that may arise. Will make follow up appointments for second initiation injection in 30 days and then maintenance injections every 2 months thereafter.   Plan: - Stop Dovato after today's dose - First Cabenuva injections administered - Second initiation injection scheduled for 12/28/22 with me - Maintenance injections scheduled for 03/06/23 with Judeth Cornfield - Call with any issues or questions   L. , PharmD, BCIDP, AAHIVP, CPP Clinical Pharmacist Practitioner Infectious Diseases Clinical Pharmacist [facility]

## 2022-11-29 NOTE — Patient Instructions (Signed)
It was great meeting you today!   You received the long-acting injectable, Cabenuva, today. Injection site reactions are common with the first few injections. They are generally mild to moderate in nature and only last a few days. For the first 2-3 injections, take an OTC pain medication, such as Motrin or Tylenol, within a couple hours before your injection and continue as needed for a few days. You may also apply a warm compress or heating pad to the injection site for 15-20 minutes after the injection. Please try not to rub the injection site as this can disrupt the medication pocket in your muscle.   Other side effects, such as a nodule at the site of injection, pain, tenderness, swelling, or bruising can happen but are rare. Please let us know if you have any issues.  Cabenuva lasts a long time in your body but does not keep appropriate levels in your body for more than a few months. Please make all of your scheduled appointments and let us know ASAP if you have to miss an injection as you can develop resistant HIV virus or have a high viral load with missed injections.  Please send [place] a MyChart message if you have any questions. Have a great day!

## 2022-12-02 ENCOUNTER — Other Ambulatory Visit: Payer: Medicaid Other | Admitting: Obstetrics and Gynecology

## 2022-12-02 ENCOUNTER — Encounter: Payer: Self-pay | Admitting: Obstetrics and Gynecology

## 2022-12-02 NOTE — Patient Instructions (Signed)
Hi [name], it was great speaking with you today and catching up-have a wonderful day and weekend!!  [name] was given information about Medicaid Managed Care team care coordination services as a part of their [facility] Community Plan Medicaid benefit. [name] [name] verbally consented to engagement with the [facility] Managed Care team.   If you are experiencing a medical emergency, please call 911 or report to your local emergency department or urgent care.   If you have a non-emergency medical problem during routine business hours, please contact your provider's office and ask to speak with a nurse.   For questions related to your [facility], please call: [phone] or visit the homepage here: [email or web address]  If you would like to schedule transportation through your [facility], please call the following number at least 2 days in advance of your appointment: [phone]   Rides for urgent appointments can also be made after hours by calling Member Services.  Call the Behavioral Health Crisis Line at [phone], at any time, 24 hours a day, 7 days a week. If you are in danger or need immediate medical attention call 911.  If you would like help to quit smoking, call 1-800-QUIT-NOW ((863)812-1681) OR Espaol: 1-855-Djelo-Ya (3-329-518-8416) o para ms informacin haga clic aqu or Text READY to 606-301 to register via text  Ms. Batzel - following are the goals we discussed in your visit today:   Goals Addressed             This Visit's Progress    Protect My Health       Timeframe:  Long-Range Goal Priority:  Medium Start Date:      05/07/20                       Expected End Date: ongoing             Follow Up Date: 01/31/23 - schedule appointment for vaccines needed due to my age or health - schedule recommended health tests (blood work, mammogram,  colonoscopy, pap test) - schedule and keep appointment for annual check-up    Why is this important?   Screening tests can find diseases early when they are easier to treat.  Your doctor or nurse will talk with you about which tests are important for you.  Getting shots for common diseases like the flu and shingles will help prevent them.   12/02/22:  Up to date on all appts-very compliant   Patient verbalizes understanding of instructions and care plan provided today and agrees to view in MyChart. Active MyChart status and patient understanding of how to access instructions and care plan via MyChart confirmed with patient.     The Managed Medicaid care management team will reach out to the patient again over the next 60 business  days.  The  Patient has been provided with contact information for the Managed Medicaid care management team and has been advised to call with any health related questions or concerns.   [name] RN, BSN [place]  [facility] Care Management Coordinator - Managed Medicaid High Risk [phone]   Following is a copy of your plan of care:  Care Plan : General Plan of Care (Adult)  Updates made by [name], RN since 12/02/2022 12:00 AM     Problem: Health Promotion or Disease Self-Management (General Plan of Care)   Priority: Medium  Onset Date: 08/10/2020  Long-Range Goal: Self-Management Plan Developed   Start Date: 05/07/2020  Expected End Date: 03/04/2023  Recent Progress: On track  Priority: Medium  Note:   Current Barriers:  Chronic Disease Management support and education needs related to DM, HTN, asthma, GERD, anxiety, osteoarthritis, HLD, HIV 12/02/22:  left shoulder pain/discomfort ongoing-followed bu [facility]TH[facility].  Blood sugar  and blood pressure stable. Blood sugar 124-125.  Up to date on all appts.   Nurse Case Manager Clinical Goal(s):  Over the next 30 days, patient will attend all scheduled medical  appointments  Interventions:  Inter-disciplinary care team collaboration (see longitudinal plan of care) Evaluation of current treatment plan and patient's adherence to plan as established by provider. Reviewed medications with patient. Collaborated with pharmacy regarding medications. Discussed plans with patient for ongoing care management follow up and provided patient with direct contact information for care management team Reviewed scheduled/upcoming provider appointments. Pharmacy referral for medication review Patient provided with [facility] transportation information.  Asthma: (Status:New goal.) Long Term Goal Provided education about and advised patient to utilize infection prevention strategies to reduce risk of respiratory infection Discussed the importance of adequate rest and management of fatigue with Asthma Assessed social determinant of health barriers   Diabetes Interventions:  (Status:  New goal.) Long Term Goal Assessed patient's understanding of A1c goal: <7% Reviewed medications with patient and discussed importance of medication adherence Counseled on importance of regular laboratory monitoring as prescribed Discussed plans with patient for ongoing care management follow up and provided patient with direct contact information for care management team Reviewed scheduled/upcoming provider appointments Review of patient status, including review of consultants reports, relevant laboratory and other test results, and medications completed Assessed social determinant of health barriers Lab Results  Component Value Date   HGBA1C 6.4 (A) 02/18/2022       HGBA1C                                                      7.1                                    08/05/22      HGBA1C                                                      6.5                                    09/13/22  Hyperlipidemia Interventions:  (Status:  New goal.) Long Term Goal Medication review performed; medication  list updated in electronic medical record.  Provider established cholesterol goals reviewed Counseled on importance of regular laboratory monitoring as prescribed Assessed social determinant of health barriers   Hypertension Interventions:  (Status:  New goal.) Long Term Goal Last practice recorded BP readings:  BP Readings from Last 3 Encounters:           07/03/22        137/80. 09/02/22        123/70 12/02/22  126/77  Most recent eGFR/CrCl:  Lab Results  Component Value Date   EGFR 73 02/08/2022    No components found for: "CRCL"  Evaluation of current treatment plan related to hypertension self management and patient's adherence to plan as established by provider Reviewed medications with patient and discussed importance of compliance Discussed plans with patient for ongoing care management follow up and provided patient with direct contact information for care management team Advised patient, providing education and rationale, to monitor blood pressure daily and record, calling PCP for findings outside established parameters Reviewed scheduled/upcoming provider appointments  Assessed social determinant of health barriers   Patient Goals/Self-Care Activities Over the next 30 days, patient will:  -Attends all scheduled provider appointments Calls pharmacy for medication refills Calls provider office for new concerns or questions  Follow Up Plan: The Managed Medicaid care management team will reach out to the patient again over the next 60 business  days.  The patient has been provided with contact information for the Managed Medicaid care management team and has been advised to call with any health related questions or concerns.

## 2022-12-02 NOTE — Patient Outreach (Signed)
Medicaid Managed Care   Nurse Care Manager Note  12/02/2022 Name:  [name] [name] MRN:  [id number] DOB:  [birth year]  [name] [name] is an 62 y.o. year old female who is a primary patient of [name], MD.  The [facility] Managed Care Coordination team was consulted for assistance with:    Chronic healthcare management needs, DM, HTN, HIV, Addison's disease, rhinitis, osteopenia, pituitary adenoma,  HLD, asthma, GERD, anxiety, osteoarthritis  [name] was given information about Medicaid Managed Care Coordination team services today. [name] [name] Patient agreed to services and verbal consent obtained.  Engaged with patient by telephone for follow up visit in response to provider referral for case management and/or care coordination services.   Assessments/Interventions:  Review of past medical history, allergies, medications, health status, including review of consultants reports, laboratory and other test data, was performed as part of comprehensive evaluation and provision of chronic care management services.  SDOH (Social Determinants of Health) assessments and interventions performed: SDOH Interventions    Flowsheet Row Patient Outreach Telephone from 12/02/2022 in [place] POPULATION HEALTH DEPARTMENT Patient Outreach Telephone from 09/02/2022 in [place] POPULATION HEALTH DEPARTMENT Office Visit from 08/17/2022 in [facility] Patient Outreach Telephone from 07/04/2022 in [place] POPULATION HEALTH DEPARTMENT Office Visit from 06/27/2022 in [facility] Patient Outreach Telephone from 06/03/2022 in  POPULATION HEALTH DEPARTMENT  SDOH Interventions        Food Insecurity Interventions -- Intervention Not Indicated Intervention Not Indicated -- Intervention Not Indicated --  Housing Interventions -- Intervention Not Indicated Intervention Not Indicated -- Intervention Not Indicated --  Transportation Interventions -- -- --  -- Intervention Not Indicated --  Utilities Interventions -- -- Intervention Not Indicated -- Intervention Not Indicated --  Alcohol Usage Interventions -- -- Intervention Not Indicated (Score <7) Intervention Not Indicated (Score <7) -- --  Depression Interventions/Treatment  -- -- -- -- --  [Provider made aware] --  Financial Strain Interventions -- -- Intervention Not Indicated -- Intervention Not Indicated Intervention Not Indicated  Physical Activity Interventions -- -- Intervention Not Indicated -- -- Intervention Not Indicated  Stress Interventions -- -- Intervention Not Indicated Intervention Not Indicated -- --  Social Connections Interventions -- -- -- -- Intervention Not Indicated --  Health Literacy Interventions Intervention Not Indicated -- -- -- -- --     Care Plan Allergies  Allergen Reactions   Acetaminophen Other (See Comments)    Inflamed liver, hospitalized  Other Reaction(s): liver demage   Morphine Sulfate Hives and Shortness Of Breath   Triamterene Hives   Aspirin-Caffeine Other (See Comments)    liver damage; upset stomach  Other Reaction(s): liver demage   Dyazide [Hydrochlorothiazide W-Triamterene] Hives   Empagliflozin Diarrhea    (Jardiance) stomach ache   Metformin Hcl Er Other (See Comments) and Nausea Only    upset stomach   Topamax [Topiramate] Other (See Comments)    Vision disturbances.   Citalopram Itching, Rash and Other (See Comments)   Emtricitabine-Tenofovir Df Rash    Descovy   Lisinopril Other (See Comments)    Cough    Losartan Nausea Only and Rash    Pt had rash, worsening dizziness, and nausea after starting losartan, which improved after stopping losartan   Triamterene-Hctz Rash   Medications Reviewed Today     Reviewed by [name], RN (Registered Nurse) on 12/02/22 at 0913  Med List Status: <None>   Medication Order Taking? Sig Documenting Provider Last  Dose Status Informant  ACCU-CHEK GUIDE test strip [id number] No use  to check blood sugar In Vitro Once a day DX: E11.9 for 90 days [provider] Taking Active   amLODipine (NORVASC) 10 MG tablet [id number] No TAKE ONE TABLET BY MOUTH DAILY Evlyn Kanner, MD Taking Active   atorvastatin (LIPITOR) 10 MG tablet [id number] No TAKE HALF TABLET BY MOUTH DAILY Katsadouros, Vasilios, MD Taking Active Self  cabotegravir & rilpivirine ER (CABENUVA) 600 & 900 MG/3ML injection [id number]  Inject 1 kit into the muscle every 8 (eight) weeks. Blanchard Kelch, NP  Active   cabotegravir & rilpivirine ER (CABENUVA) 600 & 900 MG/3ML injection [id number]  Inject 1 kit into the muscle every 30 (thirty) days. Jennette Kettle, RPH-CPP  Active   carvedilol (COREG) 12.5 MG tablet [id number] No Take 1 tablet (12.5 mg total) by mouth 2 (two) times daily with a meal. Steffanie Rainwater, MD Taking Active   celecoxib (CELEBREX) 100 MG capsule [id number] No TAKE ONE CAPSULE BY MOUTH TWICE A DAY Magnant, Charles L, PA-C Taking Active   cetirizine (ZYRTEC) 10 MG tablet [id number] No Take 1-2 tablets 1-2 times a day for itch Jessica Priest, MD Taking Active   chlorhexidine (PERIDEX) 0.12 % solution [id number] No USE AS DIRECTED 15 MLS IN THE MOUTH OR THROAT TWICE A DAY Dixon, Gomez Cleverly, NP Taking Active   cholecalciferol (VITAMIN D3) 25 MCG (1000 UNIT) tablet [id number] No Take 3 tablets (3,000 Units total) by mouth daily. Modena Slater, DO Taking Active   ciclopirox (PENLAC) 8 % solution [id number] No APPLY OVER NAIL AND SURROUNDING SKIN AT BEDTIME OVER PREVIOUS COAT. REMOVE AFTER 7DAYS/REPEAT Vivi Barrack, DPM Taking Active   ciprofloxacin-dexamethasone [facility]) OTIC suspension [id number] No 4 drops to affected ear twice daily [provider] Taking Active   conjugated estrogens (PREMARIN) vaginal cream [id number] No Place 1 Applicatorful vaginally 2 (two) times a week. Blanchard Kelch, NP Taking Active   diphenoxylate-atropine (LOMOTIL) 2.5-0.025 MG tablet [id number] No Take  by mouth 4 (four) times daily as needed for diarrhea or loose stools. [provider] Taking Active   famotidine (PEPCID) 40 MG tablet [id number] No Take 1 tablet (40 mg total) by mouth at bedtime. Kozlow, Alvira Philips, MD Taking Active   GLOBAL EASE INJECT PEN NEEDLES 32G X 4 MM MISC [id number] No USE AS DIRECTED WITH VICTOZA for 30 [provider] Taking Active   hydrochlorothiazide (HYDRODIURIL) 25 MG tablet [id number] No TAKE ONE TABLET BY MOUTH DAILY Evlyn Kanner, MD Taking Active   hydrocortisone (CORTEF) 5 MG tablet [id number] No Take 2.5-5 mg by mouth See admin instructions. Take 1 tablet (5 mg) by mouth in the morning (scheduled) & may taken an additional 0.5 tablet (2.5 mg) by mouth in the evening if needed. [provider] Taking Active Self           Med Note Clearance Coots, Alanda Slim Mar 24, 2022  2:57 PM) 2.5 mg once daily  liraglutide (VICTOZA) 18 MG/3ML SOPN [id number] No Inject 1.8 mg into the skin every evening. [provider] Taking Active Self  montelukast (SINGULAIR) 10 MG tablet [id number] No Take 1 tablet (10 mg total) by mouth at bedtime. Jessica Priest, MD Taking Active   NEURONTIN 100 MG capsule [id number]  TAKE ONE TO TWO TABLETS BY MOUTH THREE TO FOUR TIMES DAILY Kozlow, Alvira Philips, MD  Active   nitroGLYCERIN (NITROSTAT) 0.4 MG SL tablet [id number] No Place 1 tablet (0.4 mg  total) under the tongue every 5 (five) minutes as needed for chest pain.  Patient not taking: Reported on 10/20/2022   Steffanie Rainwater, MD Not Taking Active   nortriptyline (PAMELOR) 10 MG capsule [id number] No Take 2 capsules (20 mg total) by mouth at bedtime. Levert Feinstein, MD Taking Active   ondansetron [facility]) 4 MG tablet [id number] No Take 1 tablet (4 mg total) by mouth every 8 (eight) hours as needed for nausea or vomiting. Jennette Kettle, RPH-CPP Taking Active   pantoprazole (PROTONIX) 40 MG tablet [id number] No Take 1 tablet (40 mg total) by mouth 2 (two) times daily.  Kozlow, Alvira Philips, MD Taking Active   potassium chloride SA (KLOR-CON) 20 MEQ tablet [id number] No Take 20 mEq by mouth in the morning. [provider] Taking Active Self  RESTASIS 0.05 % ophthalmic emulsion [id number] No Place 1 drop into both eyes 2 (two) times daily. [provider] Taking Active Self  sertraline (ZOLOFT) 100 MG tablet [id number] No Take 1 tablet (100 mg total) by mouth daily. Marrianne Mood, MD Taking Active   sodium chloride (OCEAN) 0.65 % SOLN nasal spray [id number] No PLACE 1 SPRAY INTO BOTH NOSTRILS AS NEEDED FOR CONGESTION. Camelia Phenes, DO Taking Active Self  SUMAtriptan (IMITREX) 25 MG tablet [id number] No Take 1 tablet (25 mg total) by mouth every 2 (two) hours as needed for migraine. May repeat in 2 hours if headache persists or recurs. Levert Feinstein, MD Taking Active Self  SYMBICORT 160-4.5 MCG/ACT inhaler [id number] No Inhale 2 puffs into the lungs 2 (two) times daily. INHALE TWO PUFFS INTO THE LUNGS IN THE MORNING AND AT BEDTIME Kozlow, Alvira Philips, MD Taking Active   traMADol (ULTRAM) 50 MG tablet [id number] No 1 po q 12hrs prn pain Cammy Copa, MD Taking Active   triamcinolone (NASACORT) 55 MCG/ACT AERO nasal inhaler [id number] No Place 1 spray into the nose 2 (two) times daily. 1 spray each nostril 2 times per day Jessica Priest, MD Taking Active   VENTOLIN HFA 108 (90 Base) MCG/ACT inhaler [id number]  INHALE TWO PUFFS BY MOUTH INTO THE LUNGS EVERY 4 HOURS AS NEEDED FOR WHEEZING OR SHORTNESS OF BREATH Lucie Leather Alvira Philips, MD  Active            Patient Active Problem List   Diagnosis Date Noted   Vaginal atrophy 10/20/2022   Screening for cervical cancer 10/20/2022   Chronic night sweats 08/23/2022   Otitis externa of left ear 08/17/2022   Osteopenia after menopause 03/15/2022   Anxiety 03/15/2022   Prediabetes 02/18/2022   Impingement syndrome of left shoulder 07/01/2021   Grief reaction 06/08/2020   Osteoarthritis of thumb, right  11/29/2018   Chronic migraine w/o aura w/o status migrainosus, not intractable 08/27/2018   Central perforation of tympanic membrane of left ear 12/09/2016   Eustachian tube dysfunction, left 03/09/2016   Spondylosis of lumbar region without myelopathy or radiculopathy 10/08/2015   Liver fibrosis (HCC) 12/04/2014   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   Vitamin D deficiency 06/29/2014   Secondary adrenal insufficiency (HCC) 06/29/2014   History of tympanostomy tube placement 02/07/2014   Hepatitis C virus infection cured after antiviral drug therapy 03/16/2012   Health care maintenance 12/15/2011   HTN (hypertension) 10/05/2010   Hyperlipidemia associated with type 2 diabetes mellitus (HCC) 10/23/2008   HIV disease (HCC) 05/09/2006   Allergic rhinitis 05/09/2006   GERD 05/09/2006   Conditions to be addressed/monitored per PCP  order:  Chronic healthcare management needs, DM, HTN, HIV, Addison's disease, rhinitis, osteopenia, pituitary adenoma  Care Plan : General Plan of Care (Adult)  Updates made by [name], RN since 12/02/2022 12:00 AM     Problem: Health Promotion or Disease Self-Management (General Plan of Care)   Priority: Medium  Onset Date: 08/10/2020     Long-Range Goal: Self-Management Plan Developed   Start Date: 05/07/2020  Expected End Date: 03/04/2023  Recent Progress: On track  Priority: Medium  Note:   Current Barriers:  Chronic Disease Management support and education needs related to DM, HTN, asthma, GERD, anxiety, osteoarthritis, HLD, HIV 12/02/22:  left shoulder pain/discomfort ongoing-followed bu [facility].  Blood sugar  and blood pressure stable. Blood sugar 124-125.  Up to date on all appts.   Nurse Case Manager Clinical Goal(s):  Over the next 30 days, patient will attend all scheduled medical appointments  Interventions:  Inter-disciplinary care team collaboration (see longitudinal plan of  care) Evaluation of current treatment plan and patient's adherence to plan as established by provider. Reviewed medications with patient. Collaborated with pharmacy regarding medications. Discussed plans with patient for ongoing care management follow up and provided patient with direct contact information for care management team Reviewed scheduled/upcoming provider appointments. Pharmacy referral for medication review Patient provided with [facility] transportation information.  Asthma: (Status:New goal.) Long Term Goal Provided education about and advised patient to utilize infection prevention strategies to reduce risk of respiratory infection Discussed the importance of adequate rest and management of fatigue with Asthma Assessed social determinant of health barriers   Diabetes Interventions:  (Status:  New goal.) Long Term Goal Assessed patient's understanding of A1c goal: <7% Reviewed medications with patient and discussed importance of medication adherence Counseled on importance of regular laboratory monitoring as prescribed Discussed plans with patient for ongoing care management follow up and provided patient with direct contact information for care management team Reviewed scheduled/upcoming provider appointments Review of patient status, including review of consultants reports, relevant laboratory and other test results, and medications completed Assessed social determinant of health barriers Lab Results  Component Value Date   HGBA1C 6.4 (A) 02/18/2022       HGBA1C                                                      7.1                                    08/05/22      HGBA1C                                                      6.5                                    09/13/22  Hyperlipidemia Interventions:  (Status:  New goal.) Long Term Goal Medication review performed; medication list updated in electronic medical record.  Provider established cholesterol goals  reviewed Counseled on importance of regular laboratory monitoring as prescribed Assessed social  determinant of health barriers   Hypertension Interventions:  (Status:  New goal.) Long Term Goal Last practice recorded BP readings:  BP Readings from Last 3 Encounters:           07/03/22        137/80. 09/02/22        123/70 12/02/22        126/77  Most recent eGFR/CrCl:  Lab Results  Component Value Date   EGFR 73 02/08/2022    No components found for: "CRCL"  Evaluation of current treatment plan related to hypertension self management and patient's adherence to plan as established by provider Reviewed medications with patient and discussed importance of compliance Discussed plans with patient for ongoing care management follow up and provided patient with direct contact information for care management team Advised patient, providing education and rationale, to monitor blood pressure daily and record, calling PCP for findings outside established parameters Reviewed scheduled/upcoming provider appointments  Assessed social determinant of health barriers   Patient Goals/Self-Care Activities Over the next 30 days, patient will:  -Attends all scheduled provider appointments Calls pharmacy for medication refills Calls provider office for new concerns or questions  Follow Up Plan: The Managed Medicaid care management team will reach out to the patient again over the next 60 business  days.  The patient has been provided with contact information for the Managed Medicaid care management team and has been advised to call with any health related questions or concerns.    Follow Up:  Patient agrees to Care Plan and Follow-up.  Plan: The Managed Medicaid care management team will reach out to the patient again over the next 60 business  days. and The  Patient has been provided with contact information for the Managed Medicaid care management team and has been advised to call with any health  related questions or concerns.  Date/time of next scheduled RN care management/care coordination outreach: 02/01/23 at 0900

## 2022-12-05 DIAGNOSIS — H7293 Unspecified perforation of tympanic membrane, bilateral: Secondary | ICD-10-CM | POA: Diagnosis not present

## 2022-12-15 ENCOUNTER — Other Ambulatory Visit (HOSPITAL_COMMUNITY): Payer: Self-pay

## 2022-12-20 ENCOUNTER — Other Ambulatory Visit (HOSPITAL_COMMUNITY): Payer: Self-pay

## 2022-12-20 NOTE — Telephone Encounter (Signed)
patient is scheduled for  12/30/22 and we don't have an approval. She is scheduled for Bilateral Sacroiliac injection cpt code 06237. Need number 1 and 3 done.

## 2022-12-20 NOTE — Progress Notes (Signed)
  PROCEDURE RECORD [facility]   Name: [name] [name] DOB:11-11-1960 MRN: [id number]  Date:12/20/2022  Physician: Claudette Laws, MD    Nurse/CMA: Charise Carwin MA  Allergies:  Allergies  Allergen Reactions   Acetaminophen Other (See Comments)    Inflamed liver, hospitalized  Other Reaction(s): liver demage   Morphine Sulfate Hives and Shortness Of Breath   Triamterene Hives   Aspirin-Caffeine Other (See Comments)    liver damage; upset stomach  Other Reaction(s): liver demage   Dyazide [Hydrochlorothiazide W-Triamterene] Hives   Empagliflozin Diarrhea    (Jardiance) stomach ache   Metformin Hcl Er Other (See Comments) and Nausea Only    upset stomach   Topamax [Topiramate] Other (See Comments)    Vision disturbances.   Citalopram Itching, Rash and Other (See Comments)   Emtricitabine-Tenofovir Df Rash    Descovy   Lisinopril Other (See Comments)    Cough    Losartan Nausea Only and Rash    Pt had rash, worsening dizziness, and nausea after starting losartan, which improved after stopping losartan   Triamterene-Hctz Rash    Consent Signed: Yes.    Is patient diabetic? Yes.    CBG today? 124  Pregnant: No. LMP: No LMP recorded. Patient is postmenopausal. (age [age])  Anticoagulants: no Anti-inflammatory: no Antibiotics: no  Procedure: Bilateral Sacroiliac Injection I injections  Position: Prone Start Time: 11:14 am  End Time: 11:20 am  Fluoro Time: 22  RN/CMA [name]    Time 10:32 AM 11:25 am    BP 128/79 125/75    Pulse 83 83    Respirations 16 16    O2 Sat 95 95    S/S 6 6    Pain Level 10/10 6/10     D/C home with Self, [name] & O X 3, D/C instructions reviewed, and sits independently.         Subjective:    [name], female    DOB: 08/18/1960, 62 y.o.   MRN: [id number]  HPI    Review of Systems     Objective:   Physical Exam        Assessment & Plan:

## 2022-12-21 ENCOUNTER — Other Ambulatory Visit: Payer: Self-pay | Admitting: Orthopedic Surgery

## 2022-12-21 ENCOUNTER — Other Ambulatory Visit: Payer: Self-pay | Admitting: Allergy and Immunology

## 2022-12-21 DIAGNOSIS — E099 Drug or chemical induced diabetes mellitus without complications: Secondary | ICD-10-CM

## 2022-12-22 ENCOUNTER — Telehealth: Payer: Self-pay

## 2022-12-22 NOTE — Telephone Encounter (Signed)
[facility] Patient Advocate Encounter  Patient's medication [name]) have been couriered to [facility] from [employer] and will be administered on the patient next office visit on 12/28/22.  Clearance Coots , CPhT Specialty Pharmacy Patient [facility] Phone: [phone] Fax:  [phone]

## 2022-12-28 ENCOUNTER — Other Ambulatory Visit: Payer: Self-pay

## 2022-12-28 ENCOUNTER — Ambulatory Visit (INDEPENDENT_AMBULATORY_CARE_PROVIDER_SITE_OTHER): Payer: Medicaid Other | Admitting: Pharmacist

## 2022-12-28 DIAGNOSIS — Z2981 Encounter for HIV pre-exposure prophylaxis: Secondary | ICD-10-CM | POA: Diagnosis present

## 2022-12-28 DIAGNOSIS — Z23 Encounter for immunization: Secondary | ICD-10-CM | POA: Diagnosis not present

## 2022-12-28 DIAGNOSIS — B20 Human immunodeficiency virus [HIV] disease: Secondary | ICD-10-CM

## 2022-12-28 MED ORDER — CABOTEGRAVIR & RILPIVIRINE ER 600 & 900 MG/3ML IM SUER
1.0000 | Freq: Once | INTRAMUSCULAR | Status: AC
Start: 2022-12-28 — End: 2022-12-28
  Administered 2022-12-28: 1 via INTRAMUSCULAR

## 2022-12-28 NOTE — Progress Notes (Signed)
HPI: [name] [name] is a 62 y.o. female who presents to the [facility] pharmacy clinic for [place] administration.  Patient Active Problem List   Diagnosis Date Noted   Vaginal atrophy 10/20/2022   Screening for cervical cancer 10/20/2022   Chronic night sweats 08/23/2022   Otitis externa of left ear 08/17/2022   Osteopenia after menopause 03/15/2022   Anxiety 03/15/2022   Prediabetes 02/18/2022   Impingement syndrome of left shoulder 07/01/2021   Grief reaction 06/08/2020   Osteoarthritis of thumb, right 11/29/2018   Chronic migraine w/o aura w/o status migrainosus, not intractable 08/27/2018   Central perforation of tympanic membrane of left ear 12/09/2016   Eustachian tube dysfunction, left 03/09/2016   Spondylosis of lumbar region without myelopathy or radiculopathy 10/08/2015   Liver fibrosis (HCC) 12/04/2014   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   Vitamin D deficiency 06/29/2014   Secondary adrenal insufficiency (HCC) 06/29/2014   History of tympanostomy tube placement 02/07/2014   Hepatitis C virus infection cured after antiviral drug therapy 03/16/2012   Health care maintenance 12/15/2011   HTN (hypertension) 10/05/2010   Hyperlipidemia associated with type 2 diabetes mellitus (HCC) 10/23/2008   HIV disease (HCC) 05/09/2006   Allergic rhinitis 05/09/2006   GERD 05/09/2006    Patient's Medications  New Prescriptions   No medications on file  Previous Medications   ACCU-CHEK GUIDE TEST STRIP    use to check blood sugar In Vitro Once a day DX: E11.9 for 90 days   AMLODIPINE (NORVASC) 10 MG TABLET    TAKE ONE TABLET BY MOUTH DAILY   ATORVASTATIN (LIPITOR) 10 MG TABLET    TAKE HALF TABLET BY MOUTH DAILY   CABOTEGRAVIR & RILPIVIRINE ER (CABENUVA) 600 & 900 MG/3ML INJECTION    Inject 1 kit into the muscle every 8 (eight) weeks.   CABOTEGRAVIR & RILPIVIRINE ER (CABENUVA) 600 & 900 MG/3ML INJECTION    Inject 1 kit into the  muscle every 30 (thirty) days.   CARVEDILOL (COREG) 12.5 MG TABLET    Take 1 tablet (12.5 mg total) by mouth 2 (two) times daily with a meal.   CELECOXIB (CELEBREX) 100 MG CAPSULE    TAKE ONE CAPSULE BY MOUTH TWICE A DAY   CETIRIZINE (ZYRTEC) 10 MG TABLET    Take 1-2 tablets 1-2 times a day for itch   CHLORHEXIDINE (PERIDEX) 0.12 % SOLUTION    USE AS DIRECTED 15 MLS IN THE MOUTH OR THROAT TWICE A DAY   CHOLECALCIFEROL (VITAMIN D3) 25 MCG (1000 UNIT) TABLET    Take 3 tablets (3,000 Units total) by mouth daily.   CICLOPIROX (PENLAC) 8 % SOLUTION    APPLY OVER NAIL AND SURROUNDING SKIN AT BEDTIME OVER PREVIOUS COAT. REMOVE AFTER 7DAYS/REPEAT   CIPROFLOXACIN-DEXAMETHASONE (CIPRODEX) OTIC SUSPENSION    4 drops to affected ear twice daily   CONJUGATED ESTROGENS (PREMARIN) VAGINAL CREAM    Place 1 Applicatorful vaginally 2 (two) times a week.   DIPHENOXYLATE-ATROPINE (LOMOTIL) 2.5-0.025 MG TABLET    Take by mouth 4 (four) times daily as needed for diarrhea or loose stools.   FAMOTIDINE (PEPCID) 40 MG TABLET    Take 1 tablet (40 mg total) by mouth at bedtime.   GLOBAL EASE INJECT PEN NEEDLES 32G X 4 MM MISC    USE AS DIRECTED WITH VICTOZA for 30   HYDROCHLOROTHIAZIDE (HYDRODIURIL) 25 MG TABLET    TAKE ONE TABLET BY MOUTH DAILY   HYDROCORTISONE (CORTEF) 5 MG TABLET  Take 2.5-5 mg by mouth See admin instructions. Take 1 tablet (5 mg) by mouth in the morning (scheduled) & may taken an additional 0.5 tablet (2.5 mg) by mouth in the evening if needed.   LIRAGLUTIDE (VICTOZA) 18 MG/3ML SOPN    Inject 1.8 mg into the skin every evening.   MONTELUKAST (SINGULAIR) 10 MG TABLET    Take 1 tablet (10 mg total) by mouth at bedtime.   NEURONTIN 100 MG CAPSULE    TAKE ONE TO TWO CAPSULES BY MOUTH THREE TO FOUR TIMES DAILY   NITROGLYCERIN (NITROSTAT) 0.4 MG SL TABLET    Place 1 tablet (0.4 mg total) under the tongue every 5 (five) minutes as needed for chest pain.   NORTRIPTYLINE (PAMELOR) 10 MG CAPSULE    Take 2  capsules (20 mg total) by mouth at bedtime.   ONDANSETRON (ZOFRAN) 4 MG TABLET    Take 1 tablet (4 mg total) by mouth every 8 (eight) hours as needed for nausea or vomiting.   PANTOPRAZOLE (PROTONIX) 40 MG TABLET    Take 1 tablet (40 mg total) by mouth 2 (two) times daily.   POTASSIUM CHLORIDE SA (KLOR-CON) 20 MEQ TABLET    Take 20 mEq by mouth in the morning.   RESTASIS 0.05 % OPHTHALMIC EMULSION    Place 1 drop into both eyes 2 (two) times daily.   SERTRALINE (ZOLOFT) 100 MG TABLET    Take 1 tablet (100 mg total) by mouth daily.   SODIUM CHLORIDE (OCEAN) 0.65 % SOLN NASAL SPRAY    PLACE 1 SPRAY INTO BOTH NOSTRILS AS NEEDED FOR CONGESTION.   SUMATRIPTAN (IMITREX) 25 MG TABLET    Take 1 tablet (25 mg total) by mouth every 2 (two) hours as needed for migraine. May repeat in 2 hours if headache persists or recurs.   SYMBICORT 160-4.5 MCG/ACT INHALER    Inhale 2 puffs into the lungs 2 (two) times daily. INHALE TWO PUFFS INTO THE LUNGS IN THE MORNING AND AT BEDTIME   TRAMADOL (ULTRAM) 50 MG TABLET    1 po q 12hrs prn pain   TRIAMCINOLONE (NASACORT) 55 MCG/ACT AERO NASAL INHALER    Place 1 spray into the nose 2 (two) times daily. 1 spray each nostril 2 times per day   VENTOLIN HFA 108 (90 BASE) MCG/ACT INHALER    INHALE TWO PUFFS BY MOUTH INTO THE LUNGS EVERY 4 HOURS AS NEEDED FOR WHEEZING OR SHORTNESS OF BREATH  Modified Medications   No medications on file  Discontinued Medications   No medications on file    Allergies: Allergies  Allergen Reactions   Acetaminophen Other (See Comments)    Inflamed liver, hospitalized  Other Reaction(s): liver demage   Morphine Sulfate Hives and Shortness Of Breath   Triamterene Hives   Aspirin-Caffeine Other (See Comments)    liver damage; upset stomach  Other Reaction(s): liver demage   Dyazide [Hydrochlorothiazide W-Triamterene] Hives   Empagliflozin Diarrhea    (Jardiance) stomach ache   Metformin Hcl Er Other (See Comments) and Nausea Only     upset stomach   Topamax [Topiramate] Other (See Comments)    Vision disturbances.   Citalopram Itching, Rash and Other (See Comments)   Emtricitabine-Tenofovir Df Rash    Descovy   Lisinopril Other (See Comments)    Cough    Losartan Nausea Only and Rash    Pt had rash, worsening dizziness, and nausea after starting losartan, which improved after stopping losartan   Triamterene-Hctz Rash  Labs: Lab Results  Component Value Date   HIV1RNAQUANT Not Detected 02/08/2022   HIV1RNAQUANT <20 (H) 05/19/2021   HIV1RNAQUANT 101 (H) 02/02/2021   CD4TABS 487 02/08/2022   CD4TABS 608 02/02/2021   CD4TABS 553 05/21/2020    RPR and STI Lab Results  Component Value Date   LABRPR NON-REACTIVE 02/02/2021   LABRPR NON-REACTIVE 11/13/2019   LABRPR NON-REACTIVE 08/30/2018   LABRPR NON-REACTIVE 11/16/2017   LABRPR NON REAC 07/12/2016    STI Results GC CT  10/20/2022 10:51 AM Negative  Negative   02/02/2021 10:13 AM Negative  Negative   08/30/2018 12:00 AM Negative  Negative   01/11/2018 12:00 AM Negative  Negative   11/16/2017 12:00 AM Negative  Negative   03/25/2011  4:35 PM  NEGATIVE     Hepatitis B Lab Results  Component Value Date   HEPBSAB NEG 12/15/2011   HEPBSAG NEGATIVE 12/15/2011   Hepatitis C Lab Results  Component Value Date   HCVRNAPCRQN <15 NOT DETECTED 02/21/2017   Hepatitis A No results found for: "HAV" Lipids: Lab Results  Component Value Date   CHOL 149 02/18/2022   TRIG 200 (H) 02/18/2022   HDL 40 02/18/2022   CHOLHDL 3.7 02/18/2022   VLDL 44 (H) 07/12/2016   LDLCALC 75 02/18/2022    TARGET DATE: The 6th  Assessment: [name] [name] presents today for her second round of Cabenuva initiation injections. She tolerated her first round well with some increased fatigue more so than usual. Otherwise, no issues. Will check a HIV RNA today. Will also administer the annual flu vaccine.  Administered cabotegravir 600mg /51mL in left upper outer quadrant of the gluteal  muscle. Administered rilpivirine 900 mg/53mL in the right upper outer quadrant of the gluteal muscle. No issues with injections. She will follow up in 2 months for next set of injections.  Plan: - Cabenuva injections administered - HIV RNA today - Flu vaccine today - Next injections scheduled for 03/06/23 with Judeth Cornfield and 04/27/23 with me - Call with any issues or questions  Wyonia Fontanella L. Kimm Sider, PharmD, BCIDP, AAHIVP, CPP Clinical Pharmacist Practitioner Infectious Diseases Clinical Pharmacist [facility]

## 2022-12-29 LAB — HIV-1 RNA QUANT-NO REFLEX-BLD
HIV 1 RNA Quant: <20 {copies}/mL — ABNORMAL HIGH
HIV-1 RNA Quant, Log: <1.3 {Log_copies}/mL — ABNORMAL HIGH

## 2022-12-30 ENCOUNTER — Encounter: Payer: Self-pay | Admitting: Physical Medicine & Rehabilitation

## 2022-12-30 ENCOUNTER — Encounter: Payer: Medicaid Other | Attending: Physical Medicine & Rehabilitation | Admitting: Physical Medicine & Rehabilitation

## 2022-12-30 VITALS — BP 128/79 | HR 83 | Ht 61.0 in | Wt 169.0 lb

## 2022-12-30 DIAGNOSIS — M461 Sacroiliitis, not elsewhere classified: Secondary | ICD-10-CM | POA: Insufficient documentation

## 2022-12-30 MED ORDER — LIDOCAINE HCL (PF) 1 % IJ SOLN
4.0000 mL | Freq: Once | INTRAMUSCULAR | Status: AC
Start: 2022-12-30 — End: 2022-12-30
  Administered 2022-12-30: 4 mL

## 2022-12-30 MED ORDER — BETAMETHASONE SOD PHOS & ACET 6 (3-3) MG/ML IJ SUSP
12.0000 mg | Freq: Once | INTRAMUSCULAR | Status: AC
Start: 2022-12-30 — End: 2022-12-30
  Administered 2022-12-30: 12 mg via INTRAMUSCULAR

## 2022-12-30 MED ORDER — IOHEXOL 180 MG/ML  SOLN
3.0000 mL | Freq: Once | INTRAMUSCULAR | Status: AC
  Administered 2022-12-30: 3 mL via INTRAVENOUS

## 2022-12-30 MED ORDER — LIDOCAINE HCL (PF) 2 % IJ SOLN
1.0000 mL | Freq: Once | INTRAMUSCULAR | Status: AC
Start: 2022-12-30 — End: 2022-12-30
  Administered 2022-12-30: 1 mL

## 2022-12-30 MED ORDER — LIDOCAINE HCL 1 % IJ SOLN
5.0000 mL | Freq: Once | INTRAMUSCULAR | Status: AC
Start: 2022-12-30 — End: 2022-12-30
  Administered 2022-12-30: 5 mL

## 2022-12-30 NOTE — Progress Notes (Signed)
Bilateral sacroiliac injections under fluoroscopic guidance  Indication: Low back and buttocks pain not relieved by medication management and other conservative care.Has had failure of PT and OTC meds.  Hx HIV, Last CD4 Oct 2023  487 Last injection 09/29/22 lasted ~6wks  Informed consent was obtained after describing risks and benefits of the procedure with the patient, this includes bleeding, bruising, infection, paralysis and medication side effects. The patient wishes to proceed and has given written consent. The patient was placed in a prone position. The lumbar and sacral area was marked and prepped with Betadine. A 25-gauge 1-1/2 inch needle was inserted into the skin and subcutaneous tissue and 1 mL of 1% lidocaine was injected into each side. Then a 25-gauge 3 inch spinal needle was inserted under fluoroscopic guidance into the left sacroiliac joint. AP and lateral images were utilized. Omnipaque 180x0.5 mL under live fluoroscopy demonstrated no intravascular uptake. Then a solution containing one ML of 6 mg per mL Celestone in 2 ML of 2% lidocaine MPF was injected x1.5 mL. This same procedure was repeated on the right side using the same needle, injectate, and technique. Patient tolerated the procedure well. Post procedure instructions were given. Please see post procedure form.

## 2022-12-30 NOTE — Patient Instructions (Signed)
Sacroiliac injection was performed today. A combination of numbing medicine (lidocaine) plus a cortisone medicine (betamethasone) was injected. The injection was done under x-ray guidance. This procedure has been performed to help reduce low back and buttocks pain as well as potentially hip pain. The duration of this injection is variable lasting from hours to  Months. It may repeated if needed. 

## 2022-12-30 NOTE — Addendum Note (Signed)
Addended by: Erick Colace on: 12/30/2022 01:30 PM   Modules accepted: Orders

## 2022-12-30 NOTE — Addendum Note (Signed)
Addended by: Becky Sax on: 12/30/2022 12:40 PM   Modules accepted: Orders

## 2022-12-30 NOTE — Addendum Note (Signed)
Addended by: Becky Sax on: 12/30/2022 12:46 PM   Modules accepted: Orders

## 2023-01-02 ENCOUNTER — Encounter: Payer: Self-pay | Admitting: Pharmacist

## 2023-01-05 ENCOUNTER — Ambulatory Visit: Payer: Medicaid Other

## 2023-01-05 ENCOUNTER — Ambulatory Visit (INDEPENDENT_AMBULATORY_CARE_PROVIDER_SITE_OTHER): Payer: Medicaid Other

## 2023-01-05 ENCOUNTER — Other Ambulatory Visit: Payer: Self-pay

## 2023-01-05 DIAGNOSIS — Z23 Encounter for immunization: Secondary | ICD-10-CM

## 2023-01-18 ENCOUNTER — Telehealth: Payer: Self-pay | Admitting: Orthopedic Surgery

## 2023-01-18 ENCOUNTER — Other Ambulatory Visit: Payer: Self-pay | Admitting: Surgical

## 2023-01-18 MED ORDER — DICLOFENAC SODIUM 50 MG PO TBEC: 50.0000 mg | DELAYED_RELEASE_TABLET | Freq: Two times a day (BID) | ORAL | 0 refills | Status: DC

## 2023-01-18 NOTE — Telephone Encounter (Signed)
I called and talked to the pt. She wants to try a different anti-inflammatory and would like a rx for a lidocaine patch. Stated she doesn't want narcotic pain medication. Please advise

## 2023-01-18 NOTE — Telephone Encounter (Signed)
I do not think that opioid medication is indicated for this arthritic pain.  We could try different anti-inflammatory or she can try some topical medicine in addition to the Celebrex.  If she wants opioid medication for her arthritis, think that the best option would be to have her see pain management.  Too soon for another glenohumeral injection but she could try this again in about a month from today.  Another option would be discussing surgical intervention if this pain is causing her significant discomfort.

## 2023-01-18 NOTE — Telephone Encounter (Signed)
Patient called. Would like something else for pain. Says the celebrex isn't doing anything. The pain patch cost too much. Her call back number is [phone]

## 2023-01-18 NOTE — Telephone Encounter (Signed)
Sent in RX for voltaren instead of celebrex. Should be able to get lidocaine patches OTC

## 2023-01-19 ENCOUNTER — Other Ambulatory Visit: Payer: Self-pay | Admitting: Surgical

## 2023-01-19 MED ORDER — LIDOCAINE 5 % EX PTCH: 1.0000 | MEDICATED_PATCH | CUTANEOUS | 0 refills | Status: DC

## 2023-01-19 NOTE — Telephone Encounter (Signed)
All right I sent in a prescription for Lidoderm to her pharmacy.  My understanding is that it will require prior authorization as it typically does with other patients.  Let me know if you need anything else

## 2023-01-19 NOTE — Telephone Encounter (Signed)
Called and advised.

## 2023-01-20 ENCOUNTER — Telehealth: Payer: Self-pay | Admitting: Surgical

## 2023-01-20 NOTE — Telephone Encounter (Signed)
Pt called in stating Lidocane patches did not have prior auth please advise

## 2023-01-23 ENCOUNTER — Other Ambulatory Visit: Payer: Self-pay

## 2023-01-23 MED ORDER — LIDOCAINE 4 % EX PTCH: 1.0000 | MEDICATED_PATCH | CUTANEOUS | 0 refills | Status: DC

## 2023-01-23 NOTE — Telephone Encounter (Signed)
Resubmitted to pharmacy

## 2023-01-30 ENCOUNTER — Ambulatory Visit (HOSPITAL_COMMUNITY)
Admission: RE | Admit: 2023-01-30 | Discharge: 2023-01-30 | Disposition: A | Discharge status: Home / Self Care | Payer: Medicaid Other | Source: Ambulatory Visit | Attending: Internal Medicine | Admitting: Internal Medicine

## 2023-01-30 ENCOUNTER — Other Ambulatory Visit: Payer: Self-pay

## 2023-01-30 ENCOUNTER — Encounter: Payer: Self-pay | Admitting: Student

## 2023-01-30 ENCOUNTER — Ambulatory Visit: Payer: Medicaid Other | Admitting: Student

## 2023-01-30 ENCOUNTER — Encounter: Payer: Self-pay | Admitting: Pharmacist

## 2023-01-30 VITALS — BP 141/81 | HR 80 | Temp 97.9°F | Ht 61.0 in | Wt 172.8 lb

## 2023-01-30 DIAGNOSIS — R079 Chest pain, unspecified: Secondary | ICD-10-CM

## 2023-01-30 DIAGNOSIS — Z87891 Personal history of nicotine dependence: Secondary | ICD-10-CM

## 2023-01-30 DIAGNOSIS — I10 Essential (primary) hypertension: Secondary | ICD-10-CM

## 2023-01-30 DIAGNOSIS — K219 Gastro-esophageal reflux disease without esophagitis: Secondary | ICD-10-CM

## 2023-01-30 DIAGNOSIS — E119 Type 2 diabetes mellitus without complications: Secondary | ICD-10-CM | POA: Insufficient documentation

## 2023-01-30 LAB — POCT GLYCOSYLATED HEMOGLOBIN (HGB A1C): Hemoglobin A1C: 6.5 % — AB (ref 4.0–5.6)

## 2023-01-30 LAB — GLUCOSE, CAPILLARY: Glucose-Capillary: 130 mg/dL — ABNORMAL HIGH (ref 70–99)

## 2023-01-30 MED ORDER — METOPROLOL TARTRATE 50 MG PO TABS
50.0000 mg | ORAL_TABLET | Freq: Every day | ORAL | 0 refills | Status: DC
Start: 2023-01-30 — End: 2023-02-27

## 2023-01-30 NOTE — Assessment & Plan Note (Signed)
Endorses chest pain but difficult to categorize. Sometimes burning substernal, sometimes crushing. At times worse with movement, but also PPI's help. Right sided underneath the ribs. She does endorse ongoing orthopnea, as well as increased exertional dyspnea.  She states it was difficult for her to make her bed in the morning due to shortness of breath and fatigue which is new for her.  She is able to walk about half a block before she gets short of breath.  She does not endorse fevers, chills, wheezing.  She does endorse night sweats and abdominal distention. She is to follow with cardiology soon.  No crackles, wheezing, lower extremity edema, JVD or other signs of volume overload for me on exam today.  Given that the symptoms appear to be chronic and have been going on for some months, as well as her relatively benign physical exam, I would find CHF exacerbation unlikely.  EKG today was also unremarkable, which makes me less concern for unstable angina.  She does have a history of a normal stress test in the past as well.  Plan: Patient to follow with cardiology Low concern for ACS / CHF exacerbation at this time.  Precautions given for seeking urgent medical attention

## 2023-01-30 NOTE — Assessment & Plan Note (Signed)
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  Her A1c has been relatively stable and she has not been on any medications for her diabetes.  Repeat A1c today unchanged at 6.5%.

## 2023-01-30 NOTE — Progress Notes (Signed)
Subjective:  CC: Routine follow-up, chest pain  HPI:  [name] is a 62 y.o. person with a past medical history stated below and presents today for routine follow-up and chest pain. Please see problem based assessment and plan for additional details.  Past Medical History:  Diagnosis Date   Allergic rhinitis 05/09/2006   Allergy    Anxiety    Arthritis    Asthma    CHF (congestive heart failure) (HCC)    Chronic back pain    Diabetes mellitus without complication (HCC)    GERD (gastroesophageal reflux disease)    Heart murmur    as a child   Hepatitis C    genotype 1b, 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 s/p 12 week course of simeprevir and sofosbuvir between October 2014 and January 2015 with resolution.   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   Pneumonia    Prediabetes    Refusal of blood transfusions as patient is Jehovah's Witness    Secondary adrenal insufficiency (HCC) 06/29/2014   Urticaria     Current Outpatient Medications on File Prior to Visit  Medication Sig Dispense Refill   diclofenac (VOLTAREN) 50 MG EC tablet Take 1 tablet (50 mg total) by mouth 2 (two) times daily. 60 tablet 0   ACCU-CHEK GUIDE test strip use to check blood sugar In Vitro Once a day DX: E11.9 for 90 days     amLODipine (NORVASC) 10 MG tablet TAKE ONE TABLET BY MOUTH DAILY 90 tablet 3   atorvastatin (LIPITOR) 10 MG tablet TAKE HALF TABLET BY MOUTH DAILY 90 tablet 10   cabotegravir & rilpivirine ER (CABENUVA) 600 & 900 MG/3ML injection Inject 1 kit into the muscle every 8 (eight) weeks. 6 mL 6   cabotegravir & rilpivirine ER (CABENUVA) 600 & 900 MG/3ML injection Inject 1 kit into the muscle every 30 (thirty) days. 6 mL 1   cetirizine (ZYRTEC) 10 MG tablet Take 1-2 tablets 1-2 times a day for itch 120 tablet 5   chlorhexidine (PERIDEX) 0.12 %  solution USE AS DIRECTED 15 MLS IN THE MOUTH OR THROAT TWICE A DAY 473 mL 1   cholecalciferol (VITAMIN D3) 25 MCG (1000 UNIT) tablet Take 3 tablets (3,000 Units total) by mouth daily. 90 tablet 11   ciclopirox (PENLAC) 8 % solution APPLY OVER NAIL AND SURROUNDING SKIN AT BEDTIME OVER PREVIOUS COAT. REMOVE AFTER 7DAYS/REPEAT 6.6 mL 0   ciprofloxacin-dexamethasone (CIPRODEX) OTIC suspension 4 drops to affected ear twice daily     conjugated estrogens (PREMARIN) vaginal cream Place 1 Applicatorful vaginally 2 (two) times a week. 30 g 11   cycloSPORINE (RESTASIS) 0.05 % ophthalmic emulsion Apply to eye.     diphenoxylate-atropine (LOMOTIL) 2.5-0.025 MG tablet Take by mouth 4 (four) times daily as needed for diarrhea or loose stools.     DOVATO 50-300 MG tablet Take 1 tablet by mouth daily. (Patient not taking: Reported on 12/30/2022)     doxycycline (MONODOX) 100 MG capsule Take 100 mg by mouth 2 (two) times daily.     famotidine (PEPCID) 40 MG tablet Take 1 tablet (40 mg total) by mouth at bedtime. 90 tablet 1   GLOBAL EASE INJECT PEN NEEDLES 32G X 4 MM MISC USE AS DIRECTED WITH VICTOZA for 30  hydrochlorothiazide (HYDRODIURIL) 25 MG tablet TAKE ONE TABLET BY MOUTH DAILY 90 tablet 2   hydrochlorothiazide (HYDRODIURIL) 25 MG tablet Take 1 tablet by mouth daily.     hydrocortisone (CORTEF) 5 MG tablet Take 2.5-5 mg by mouth See admin instructions. Take 1 tablet (5 mg) by mouth in the morning (scheduled) & may taken an additional 0.5 tablet (2.5 mg) by mouth in the evening if needed.     lidocaine (HM LIDOCAINE PATCH) 4 % Place 1 patch onto the skin daily. 30 patch 0   lidocaine (LIDODERM) 5 % Place 1 patch onto the skin daily. Remove & Discard patch within 12 hours or as directed by MD 30 patch 0   liraglutide (VICTOZA) 18 MG/3ML SOPN Inject 1.8 mg into the skin every evening.     montelukast (SINGULAIR) 10 MG tablet Take 1 tablet (10 mg total) by mouth at bedtime. 90 tablet 1   NEURONTIN 100 MG  capsule TAKE ONE TO TWO CAPSULES BY MOUTH THREE TO FOUR TIMES DAILY 180 capsule 0   nitroGLYCERIN (NITROSTAT) 0.4 MG SL tablet Place 1 tablet (0.4 mg total) under the tongue every 5 (five) minutes as needed for chest pain. 25 tablet 1   nortriptyline (PAMELOR) 10 MG capsule Take 2 capsules (20 mg total) by mouth at bedtime. 180 capsule 3   ondansetron (ZOFRAN) 4 MG tablet Take 1 tablet (4 mg total) by mouth every 8 (eight) hours as needed for nausea or vomiting. 20 tablet 0   pantoprazole (PROTONIX) 40 MG tablet Take 1 tablet (40 mg total) by mouth 2 (two) times daily. 180 tablet 1   potassium chloride SA (KLOR-CON) 20 MEQ tablet Take 20 mEq by mouth in the morning.     RESTASIS 0.05 % ophthalmic emulsion Place 1 drop into both eyes 2 (two) times daily.     sertraline (ZOLOFT) 100 MG tablet Take 1 tablet (100 mg total) by mouth daily. 90 tablet 3   sodium chloride (OCEAN) 0.65 % SOLN nasal spray PLACE 1 SPRAY INTO BOTH NOSTRILS AS NEEDED FOR CONGESTION. 3 Bottle 0   SUMAtriptan (IMITREX) 25 MG tablet Take 1 tablet (25 mg total) by mouth every 2 (two) hours as needed for migraine. May repeat in 2 hours if headache persists or recurs. 12 tablet 6   SYMBICORT 160-4.5 MCG/ACT inhaler Inhale 2 puffs into the lungs 2 (two) times daily. INHALE TWO PUFFS INTO THE LUNGS IN THE MORNING AND AT BEDTIME 30.6 g 1   traMADol (ULTRAM) 50 MG tablet 1 po q 12hrs prn pain 30 tablet 0   triamcinolone (NASACORT) 55 MCG/ACT AERO nasal inhaler Place 1 spray into the nose 2 (two) times daily. 1 spray each nostril 2 times per day 50.7 g 1   VENTOLIN HFA 108 (90 Base) MCG/ACT inhaler INHALE TWO PUFFS BY MOUTH INTO THE LUNGS EVERY 4 HOURS AS NEEDED FOR WHEEZING OR SHORTNESS OF BREATH 18 g 0   No current facility-administered medications on file prior to visit.    Review of Systems: Please see assessment and plan for pertinent positives and negatives.  Objective:   Vitals:   01/30/23 0943 01/30/23 1018  BP: (!) 141/80  (!) 141/81  Pulse: 80 80  Temp: 97.9 F (36.6 C)   TempSrc: Oral   SpO2: 96%   Weight: 172 lb 12.8 oz (78.4 kg)   Height: 5\' 1"  (1.549 m)     Physical Exam: Constitutional: Well-appearing, in no acute distress Cardiovascular: regular rate and rhythm, no m/r/g no  JVD Pulmonary/Chest: normal work of breathing on room air, lungs clear to auscultation bilaterally no crackles or wheezing Abdominal: soft, non-tender, mild-distended  Extremities: No edema of the lower extremities bilaterally Skin: warm and dry Psych: Pleasant mood and affect   Assessment & Plan:  HTN (hypertension) Patient continues to be hypertensive today at 141/81. Complicated by her addison's disease and hydrocortisone use. Failed ACE and ARB in the past d/t side effects. She follows with Endocrinology and is set to see them soon. We discussed the fact that her hydrocortisone may be increasing her BP and we should make sure we aren't over treating her. She has had lethargy and fatigue since beginning coreg, and feel like the medication is her issue. Hx of orthopnea, not sure if she snores. Given her BMI, there may be a degree of OSA and HTN from that as well which should be investigated. Plan: Switch coreg back to metoprolol d/t side effects Follow w/ endocrine re hydrocortisone dosing before making any medication changes.  Referral to sleep medicine   GERD Currently on BID PPI and H2 blocker. Feels like this has also gotten worse since she started on coreg. Given how long she has had these symptoms and how they have failed to improve she merits continued investigation. Plan: GI referral   Chest pain Endorses chest pain but difficult to categorize. Sometimes burning substernal, sometimes crushing. At times worse with movement, but also PPI's help. Right sided underneath the ribs. She does endorse ongoing orthopnea, as well as increased exertional dyspnea.  She states it was difficult for her to make her bed in the  morning due to shortness of breath and fatigue which is new for her.  She is able to walk about half a block before she gets short of breath.  She does not endorse fevers, chills, wheezing.  She does endorse night sweats and abdominal distention. She is to follow with cardiology soon.  No crackles, wheezing, lower extremity edema, JVD or other signs of volume overload for me on exam today.  Given that the symptoms appear to be chronic and have been going on for some months, as well as her relatively benign physical exam, I would find CHF exacerbation unlikely.  EKG today was also unremarkable, which makes me less concern for unstable angina.  She does have a history of a normal stress test in the past as well.  Plan: Patient to follow with cardiology Low concern for ACS / CHF exacerbation at this time.  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  Her A1c has been relatively stable and she has not been on any medications for her diabetes.  Repeat A1c today unchanged at 6.5%.    Patient seen with Dr. Lajean Silvius MD [facility]  [id number] Pager: [phone]  Phone: [phone] Date 01/30/2023  Time 2:12 PM

## 2023-01-30 NOTE — Patient Instructions (Addendum)
Thank you, Ms.[name] for allowing [place] to provide your care today.  I have ordered the following tests for you:  Lab Orders         Glucose, capillary         POC Hbg A1C       Referrals ordered today:   Referral Orders         Ambulatory referral to Gastroenterology         Ambulatory referral to Sleep Studies       I have ordered the following medication/changed the following medications:   Stop the following medications: Medications Discontinued During This Encounter  Medication Reason   carvedilol (COREG) 12.5 MG tablet    metoprolol tartrate (LOPRESSOR) 50 MG tablet Reorder     Start the following medications: Meds ordered this encounter  Medications   metoprolol tartrate (LOPRESSOR) 50 MG tablet    Sig: Take 1 tablet (50 mg total) by mouth daily.    Dispense:  30 tablet    Refill:  0       Follow up: 3 months for A1C    We look forward to seeing you next time. Please call our clinic at [phone] if you have any questions or concerns. The best time to call is Monday-Friday from 9am-4pm, but there is someone available 24/7. If after hours or the weekend, call the main hospital number and ask for the Internal Medicine Resident On-Call. If you need medication refills, please notify your pharmacy one week in advance and they will send [place] a request.   Thank you for trusting me with your care. Wishing you the best!  Lovie Macadamia MD [facility]

## 2023-01-30 NOTE — Assessment & Plan Note (Signed)
Currently on BID PPI and H2 blocker. Feels like this has also gotten worse since she started on coreg. Given how long she has had these symptoms and how they have failed to improve she merits continued investigation. Plan: GI referral

## 2023-01-30 NOTE — Assessment & Plan Note (Addendum)
Patient continues to be hypertensive today at 141/81. Complicated by her addison's disease and hydrocortisone use. Failed ACE and ARB in the past d/t side effects. She follows with Endocrinology and is set to see them soon. We discussed the fact that her hydrocortisone may be increasing her BP and we should make sure we aren't over treating her. She has had lethargy and fatigue since beginning coreg, and feel like the medication is her issue. Hx of orthopnea, not sure if she snores. Given her BMI, there may be a degree of OSA and HTN from that as well which should be investigated. Plan: Switch coreg back to metoprolol d/t side effects Follow w/ endocrine re hydrocortisone dosing before making any medication changes.  Referral to sleep medicine

## 2023-02-01 ENCOUNTER — Other Ambulatory Visit: Payer: Medicaid Other | Admitting: Obstetrics and Gynecology

## 2023-02-01 NOTE — Patient Outreach (Signed)
Medicaid Managed Care   Nurse Care Manager Note  02/01/2023 Name:  [name] [name] MRN:  [id number] DOB:  [birth year]  [name] [name] is an 62 y.o. year old female who is a primary patient of [name] Crocker, MD.  [facility] Managed Care Coordination team was consulted for assistance with:    Chronic healthcare management needs, DM, HTN, osteopenia, HIV, Addison's Disease, rhinitis, Pituitary adenoma  Ms. Standing was given information about Medicaid Managed Care Coordination team services today. [name] [name] Patient agreed to services and verbal consent obtained.  Engaged with patient by telephone for follow up visit in response to provider referral for case management and/or care coordination services.   Assessments/Interventions:  Review of past medical history, allergies, medications, health status, including review of consultants reports, laboratory and other test data, was performed as part of comprehensive evaluation and provision of chronic care management services.  SDOH (Social Determinants of Health) assessments and interventions performed: SDOH Interventions    Flowsheet Row Patient Outreach Telephone from 02/01/2023 in [place] POPULATION HEALTH DEPARTMENT Patient Outreach Telephone from 12/02/2022 in [place] POPULATION HEALTH DEPARTMENT Patient Outreach Telephone from 09/02/2022 in [place] POPULATION HEALTH DEPARTMENT Office Visit from 08/17/2022 in [facility] Patient Outreach Telephone from 07/04/2022 in [place] POPULATION HEALTH DEPARTMENT Office Visit from 06/27/2022 in [facility]  SDOH Interventions        Food Insecurity Interventions -- -- Intervention Not Indicated Intervention Not Indicated -- Intervention Not Indicated  Housing Interventions -- -- Intervention Not Indicated Intervention Not Indicated -- Intervention Not Indicated  Transportation Interventions Intervention Not Indicated -- -- -- -- Intervention  Not Indicated  Utilities Interventions Intervention Not Indicated -- -- Intervention Not Indicated -- Intervention Not Indicated  Alcohol Usage Interventions -- -- -- Intervention Not Indicated (Score <7) Intervention Not Indicated (Score <7) --  Depression Interventions/Treatment  -- -- -- -- -- --  [Provider made aware]  Financial Strain Interventions -- -- -- Intervention Not Indicated -- Intervention Not Indicated  Physical Activity Interventions -- -- -- Intervention Not Indicated -- --  Stress Interventions -- -- -- Intervention Not Indicated Intervention Not Indicated --  Social Connections Interventions -- -- -- -- -- Intervention Not Indicated  Health Literacy Interventions -- Intervention Not Indicated -- -- -- --     Care Plan Allergies  Allergen Reactions   Acetaminophen Other (See Comments)    Inflamed liver, hospitalized  Other Reaction(s): liver demage   Morphine Sulfate Hives and Shortness Of Breath   Triamterene Hives   Aspirin-Caffeine Other (See Comments)    liver damage; upset stomach  Other Reaction(s): liver demage   Dyazide [Hydrochlorothiazide W-Triamterene] Hives   Empagliflozin Diarrhea    (Jardiance) stomach ache   Metformin Hcl Er Other (See Comments) and Nausea Only    upset stomach   Topamax [Topiramate] Other (See Comments)    Vision disturbances.   Citalopram Itching, Rash and Other (See Comments)   Emtricitabine-Tenofovir Df Rash    Descovy   Lisinopril Other (See Comments)    Cough    Losartan Nausea Only and Rash    Pt had rash, worsening dizziness, and nausea after starting losartan, which improved after stopping losartan   Triamterene-Hctz Rash   Medications Reviewed Today     Reviewed by [name], RN (Registered Nurse) on 02/01/23 at [id number]  Med List Status: <None>   Medication Order Taking? Sig Documenting Provider Last Dose Status Informant  ACCU-CHEK GUIDE  test strip [id number] No use to check blood sugar In Vitro Once a day DX:  E11.9 for 90 days [provider] Taking Active   amLODipine (NORVASC) 10 MG tablet [id number] No TAKE ONE TABLET BY MOUTH DAILY Evlyn Kanner, MD Taking Active   atorvastatin (LIPITOR) 10 MG tablet [id number] No TAKE HALF TABLET BY MOUTH DAILY Katsadouros, Vasilios, MD Taking Active Self  cabotegravir & rilpivirine ER (CABENUVA) 600 & 900 MG/3ML injection [id number] No Inject 1 kit into the muscle every 8 (eight) weeks. Blanchard Kelch, NP Taking Active   cabotegravir & rilpivirine ER (CABENUVA) 600 & 900 MG/3ML injection [id number] No Inject 1 kit into the muscle every 30 (thirty) days. Jennette Kettle, RPH-CPP Taking Active   cetirizine (ZYRTEC) 10 MG tablet [id number] No Take 1-2 tablets 1-2 times a day for itch Kozlow, Alvira Philips, MD Taking Active   chlorhexidine (PERIDEX) 0.12 % solution [id number] No USE AS DIRECTED 15 MLS IN THE MOUTH OR THROAT TWICE A DAY Dixon, Gomez Cleverly, NP Taking Active   cholecalciferol (VITAMIN D3) 25 MCG (1000 UNIT) tablet [id number] No Take 3 tablets (3,000 Units total) by mouth daily. Modena Slater, DO Taking Active   ciclopirox (PENLAC) 8 % solution [id number] No APPLY OVER NAIL AND SURROUNDING SKIN AT BEDTIME OVER PREVIOUS COAT. REMOVE AFTER 7DAYS/REPEAT Vivi Barrack, DPM Taking Active   ciprofloxacin-dexamethasone [facility]) OTIC suspension [id number] No 4 drops to affected ear twice daily [provider] Taking Active   conjugated estrogens (PREMARIN) vaginal cream [id number] No Place 1 Applicatorful vaginally 2 (two) times a week. Blanchard Kelch, NP Taking Active   cycloSPORINE (RESTASIS) 0.05 % ophthalmic emulsion [id number] No Apply to eye. [provider] Taking Active   diclofenac (VOLTAREN) 50 MG EC tablet [id number]  Take 1 tablet (50 mg total) by mouth 2 (two) times daily. Magnant, Joycie Peek, PA-C  Active   diphenoxylate-atropine (LOMOTIL) 2.5-0.025 MG tablet [id number] No Take by mouth 4 (four) times daily as needed for diarrhea  or loose stools. [provider] Taking Active   DOVATO 50-300 MG tablet [id number] No Take 1 tablet by mouth daily.  Patient not taking: Reported on 12/30/2022   [provider] Not Taking Active   doxycycline (MONODOX) 100 MG capsule [id number] No Take 100 mg by mouth 2 (two) times daily. [provider] Taking Active   famotidine (PEPCID) 40 MG tablet [id number] No Take 1 tablet (40 mg total) by mouth at bedtime. Kozlow, Alvira Philips, MD Taking Active   GLOBAL EASE INJECT PEN NEEDLES 32G X 4 MM MISC [id number] No USE AS DIRECTED WITH VICTOZA for 30 [provider] Taking Active   hydrochlorothiazide (HYDRODIURIL) 25 MG tablet [id number] No TAKE ONE TABLET BY MOUTH DAILY Evlyn Kanner, MD Taking Active   hydrochlorothiazide (HYDRODIURIL) 25 MG tablet [id number] No Take 1 tablet by mouth daily. [provider] Taking Active   hydrocortisone (CORTEF) 5 MG tablet [id number] No Take 2.5-5 mg by mouth See admin instructions. Take 1 tablet (5 mg) by mouth in the morning (scheduled) & may taken an additional 0.5 tablet (2.5 mg) by mouth in the evening if needed. [provider] Taking Active Self           Med Note Clearance Coots, Alanda Slim Mar 24, 2022  2:57 PM) 2.5 mg once daily  lidocaine (HM LIDOCAINE PATCH) 4 % [id number]  Place 1 patch onto the skin daily. Magnant, Joycie Peek, PA-C  Active   lidocaine (LIDODERM) 5 %  [id number]  Place 1 patch onto the skin daily. Remove & Discard patch within 12 hours or as directed by MD Magnant, Joycie Peek, PA-C  Active   liraglutide (VICTOZA) 18 MG/3ML SOPN [id number] No Inject 1.8 mg into the skin every evening. [provider] Taking Active Self  metoprolol tartrate (LOPRESSOR) 50 MG tablet [id number]  Take 1 tablet (50 mg total) by mouth daily. Lovie Macadamia, MD  Active   montelukast (SINGULAIR) 10 MG tablet [id number] No Take 1 tablet (10 mg total) by mouth at bedtime. Jessica Priest, MD Taking Active    NEURONTIN 100 MG capsule [id number] No TAKE ONE TO TWO CAPSULES BY MOUTH THREE TO FOUR TIMES DAILY Kozlow, Alvira Philips, MD Taking Active   nitroGLYCERIN (NITROSTAT) 0.4 MG SL tablet [id number] No Place 1 tablet (0.4 mg total) under the tongue every 5 (five) minutes as needed for chest pain. Steffanie Rainwater, MD Taking Active   nortriptyline (PAMELOR) 10 MG capsule [id number] No Take 2 capsules (20 mg total) by mouth at bedtime. Levert Feinstein, MD Taking Active   ondansetron [facility]) 4 MG tablet [id number] No Take 1 tablet (4 mg total) by mouth every 8 (eight) hours as needed for nausea or vomiting. Jennette Kettle, RPH-CPP Taking Active   pantoprazole (PROTONIX) 40 MG tablet [id number] No Take 1 tablet (40 mg total) by mouth 2 (two) times daily. Kozlow, Alvira Philips, MD Taking Active   potassium chloride SA (KLOR-CON) 20 MEQ tablet [id number] No Take 20 mEq by mouth in the morning. [provider] Taking Active Self  RESTASIS 0.05 % ophthalmic emulsion [id number] No Place 1 drop into both eyes 2 (two) times daily. [provider] Taking Active Self  sertraline (ZOLOFT) 100 MG tablet [id number] No Take 1 tablet (100 mg total) by mouth daily. Marrianne Mood, MD Taking Active   sodium chloride (OCEAN) 0.65 % SOLN nasal spray [id number] No PLACE 1 SPRAY INTO BOTH NOSTRILS AS NEEDED FOR CONGESTION. Camelia Phenes, DO Taking Active Self  SUMAtriptan (IMITREX) 25 MG tablet [id number] No Take 1 tablet (25 mg total) by mouth every 2 (two) hours as needed for migraine. May repeat in 2 hours if headache persists or recurs. Levert Feinstein, MD Taking Active Self  SYMBICORT 160-4.5 MCG/ACT inhaler [id number] No Inhale 2 puffs into the lungs 2 (two) times daily. INHALE TWO PUFFS INTO THE LUNGS IN THE MORNING AND AT BEDTIME Kozlow, Alvira Philips, MD Taking Active   traMADol (ULTRAM) 50 MG tablet [id number] No 1 po q 12hrs prn pain Cammy Copa, MD Taking Active   triamcinolone (NASACORT) 55 MCG/ACT AERO nasal  inhaler [id number] No Place 1 spray into the nose 2 (two) times daily. 1 spray each nostril 2 times per day Jessica Priest, MD Taking Active   VENTOLIN HFA 108 [phone] Base) MCG/ACT inhaler [id number] No INHALE TWO PUFFS BY MOUTH INTO THE LUNGS EVERY 4 HOURS AS NEEDED FOR WHEEZING OR SHORTNESS OF BREATH Jessica Priest, MD Taking Active            Patient Active Problem List   Diagnosis Date Noted   Chest pain 01/30/2023   Type 2 diabetes mellitus without complication, without long-term current use of insulin (HCC) 01/30/2023   Vaginal atrophy 10/20/2022   Screening for cervical cancer 10/20/2022   Chronic night sweats 08/23/2022   Otitis externa of left ear 08/17/2022   Osteopenia after menopause 03/15/2022   Anxiety 03/15/2022   Prediabetes 02/18/2022   Impingement syndrome of left shoulder 07/01/2021  Grief reaction 06/08/2020   Osteoarthritis of thumb, right 11/29/2018   Chronic migraine w/o aura w/o status migrainosus, not intractable 08/27/2018   Central perforation of tympanic membrane of left ear 12/09/2016   Eustachian tube dysfunction, left 03/09/2016   Spondylosis of lumbar region without myelopathy or radiculopathy 10/08/2015   Liver fibrosis (HCC) 12/04/2014   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   Vitamin D deficiency 06/29/2014   Secondary adrenal insufficiency (HCC) 06/29/2014   History of tympanostomy tube placement 02/07/2014   Hepatitis C virus infection cured after antiviral drug therapy 03/16/2012   Health care maintenance 12/15/2011   HTN (hypertension) 10/05/2010   Hyperlipidemia associated with type 2 diabetes mellitus (HCC) 10/23/2008   HIV disease (HCC) 05/09/2006   Allergic rhinitis 05/09/2006   GERD 05/09/2006   Conditions to be addressed/monitored per PCP order:  Chronic healthcare management needs, DM, HTN, osteopenia, HIV, Addison's Disease, rhinitis, Pituitary adenoma  Care Plan : General Plan  of Care (Adult)  Updates made by [name], RN since 02/01/2023 12:00 AM     Problem: Health Promotion or Disease Self-Management (General Plan of Care)   Priority: Medium  Onset Date: 08/10/2020     Long-Range Goal: Self-Management Plan Developed   Start Date: 05/07/2020  Expected End Date: 05/04/2023  Recent Progress: On track  Priority: Medium  Note:   Current Barriers:  Chronic Disease Management support and education needs related to DM, HTN, asthma, GERD, anxiety, osteoarthritis, HLD, HIV 02/01/23: Ongoing left shoulder pain-followed by ORTHO and pain management.  BP and BG stable with regular f/u appts.    Nurse Case Manager Clinical Goal(s):  Over the next 30 days, patient will attend all scheduled medical appointments  Interventions:  Inter-disciplinary care team collaboration (see longitudinal plan of care) Evaluation of current treatment plan and patient's adherence to plan as established by provider. Reviewed medications with patient. Collaborated with pharmacy regarding medications. Discussed plans with patient for ongoing care management follow up and provided patient with direct contact information for care management team Reviewed scheduled/upcoming provider appointments. Pharmacy referral for medication review Patient provided with [facility] transportation information.  Asthma: (Status:New goal.) Long Term Goal Provided education about and advised patient to utilize infection prevention strategies to reduce risk of respiratory infection Discussed the importance of adequate rest and management of fatigue with Asthma Assessed social determinant of health barriers   Diabetes Interventions:  (Status:  New goal.) Long Term Goal Assessed patient's understanding of A1c goal: <7% Reviewed medications with patient and discussed importance of medication adherence Counseled on importance of regular laboratory monitoring as prescribed Discussed plans with patient for ongoing  care management follow up and provided patient with direct contact information for care management team Reviewed scheduled/upcoming provider appointments Review of patient status, including review of consultants reports, relevant laboratory and other test results, and medications completed Assessed social determinant of health barriers Lab Results  Component Value Date   HGBA1C 6.5 01/30/23       HGBA1C                                                      7.1  08/05/22      HGBA1C                                                      6.5                                    09/13/22  Hyperlipidemia Interventions:  (Status:  New goal.) Long Term Goal Medication review performed; medication list updated in electronic medical record.  Provider established cholesterol goals reviewed Counseled on importance of regular laboratory monitoring as prescribed Assessed social determinant of health barriers   Hypertension Interventions:  (Status:  New goal.) Long Term Goal Last practice recorded BP readings:  BP Readings from Last 3 Encounters:           01/31/23         115-135/77-84 09/02/22        123/70 12/02/22        126/77  Most recent eGFR/CrCl:  Lab Results  Component Value Date   EGFR 73 02/08/2022    No components found for: "CRCL"  Evaluation of current treatment plan related to hypertension self management and patient's adherence to plan as established by provider Reviewed medications with patient and discussed importance of compliance Discussed plans with patient for ongoing care management follow up and provided patient with direct contact information for care management team Advised patient, providing education and rationale, to monitor blood pressure daily and record, calling PCP for findings outside established parameters Reviewed scheduled/upcoming provider appointments  Assessed social determinant of health barriers   Patient  Goals/Self-Care Activities Over the next 60 days, patient will:  -Attends all scheduled provider appointments Calls pharmacy for medication refills Calls provider office for new concerns or questions  Follow Up Plan: The Managed Medicaid care management team will reach out to the patient again over the next 60 business  days.  The patient has been provided with contact information for the Managed Medicaid care management team and has been advised to call with any health related questions or concerns.    Follow Up:  Patient agrees to Care Plan and Follow-up.  Plan: The Managed Medicaid care management team will reach out to the patient again over the next 60 business  days. and The  Patient has been provided with contact information for the Managed Medicaid care management team and has been advised to call with any health related questions or concerns.  Date/time of next scheduled RN care management/care coordination outreach:  03/24/23 at 0900

## 2023-02-01 NOTE — Patient Instructions (Signed)
Hi Ms. Cheslock-thank you for updating me on everything this morning-always a pleasure to speak with you-have a great day!  Ms. Balducci was given information about Medicaid Managed Care team care coordination services as a part of their [facility] Community Plan Medicaid benefit. [name] verbally consented to engagement with the [facility] Managed Care team.   If you are experiencing a medical emergency, please call 911 or report to your local emergency department or urgent care.   If you have a non-emergency medical problem during routine business hours, please contact your provider's office and ask to speak with a nurse.   For questions related to your [facility], please call: [phone] or visit the homepage here: [email or web address]  If you would like to schedule transportation through your [facility], please call the following number at least 2 days in advance of your appointment: [phone]   Rides for urgent appointments can also be made after hours by calling Member Services.  Call the Behavioral Health Crisis Line at [phone], at any time, 24 hours a day, 7 days a week. If you are in danger or need immediate medical attention call 911.  If you would like help to quit smoking, call 1-800-QUIT-NOW (317-551-5504) OR Espaol: 1-855-Djelo-Ya (7-564-332-9518) o para ms informacin haga clic aqu or Text READY to 841-660 to register via text  Ms. Lowden - following are the goals we discussed in your visit today:   Goals Addressed             This Visit's Progress    Protect My Health       Timeframe:  Long-Range Goal Priority:  Medium Start Date:      05/07/20                       Expected End Date: ongoing             Follow Up Date: 03/24/23 - schedule appointment for vaccines needed due to my age or health - schedule recommended health tests (blood work,  mammogram, colonoscopy, pap test) - schedule and keep appointment for annual check-up    Why is this important?   Screening tests can find diseases early when they are easier to treat.  Your doctor or nurse will talk with you about which tests are important for you.  Getting shots for common diseases like the flu and shingles will help prevent them.   02/01/23: Upcoming appts with CARDS, ID, Allergy and Asthma, and ENDO   Patient verbalizes understanding of instructions and care plan provided today and agrees to view in MyChart. Active MyChart status and patient understanding of how to access instructions and care plan via MyChart confirmed with patient.     The Managed Medicaid care management team will reach out to the patient again over the next 60 business  days.  The  Patient  has been provided with contact information for the Managed Medicaid care management team and has been advised to call with any health related questions or concerns.   [name] RN, BSN [place]  [facility] Care Management Coordinator - Managed Medicaid High Risk [phone]   Following is a copy of your plan of care:  Care Plan : General Plan of Care (Adult)  Updates made by [name], RN since 02/01/2023 12:00 AM     Problem: Health Promotion or Disease Self-Management (General Plan of Care)   Priority: Medium  Onset Date: 08/10/2020     Long-Range Goal: Self-Management Plan Developed   Start Date: 05/07/2020  Expected End Date: 05/04/2023  Recent Progress: On track  Priority: Medium  Note:   Current Barriers:  Chronic Disease Management support and education needs related to DM, HTN, asthma, GERD, anxiety, osteoarthritis, HLD, HIV 02/01/23: Ongoing left shoulder pain-followed by ORTHO and pain management.  BP and BG stable with regular f/u appts.    Nurse Case Manager Clinical Goal(s):  Over the next 30 days, patient will attend all scheduled medical  appointments  Interventions:  Inter-disciplinary care team collaboration (see longitudinal plan of care) Evaluation of current treatment plan and patient's adherence to plan as established by provider. Reviewed medications with patient. Collaborated with pharmacy regarding medications. Discussed plans with patient for ongoing care management follow up and provided patient with direct contact information for care management team Reviewed scheduled/upcoming provider appointments. Pharmacy referral for medication review Patient provided with [facility] transportation information.  Asthma: (Status:New goal.) Long Term Goal Provided education about and advised patient to utilize infection prevention strategies to reduce risk of respiratory infection Discussed the importance of adequate rest and management of fatigue with Asthma Assessed social determinant of health barriers   Diabetes Interventions:  (Status:  New goal.) Long Term Goal Assessed patient's understanding of A1c goal: <7% Reviewed medications with patient and discussed importance of medication adherence Counseled on importance of regular laboratory monitoring as prescribed Discussed plans with patient for ongoing care management follow up and provided patient with direct contact information for care management team Reviewed scheduled/upcoming provider appointments Review of patient status, including review of consultants reports, relevant laboratory and other test results, and medications completed Assessed social determinant of health barriers Lab Results  Component Value Date   HGBA1C 6.5 01/30/23       HGBA1C                                                      7.1                                    08/05/22      HGBA1C                                                      6.5                                    09/13/22  Hyperlipidemia Interventions:  (Status:  New goal.) Long Term Goal Medication review performed; medication list  updated in electronic medical record.  Provider established cholesterol goals reviewed Counseled on importance of regular laboratory monitoring as prescribed Assessed social determinant of health barriers   Hypertension Interventions:  (Status:  New goal.) Long Term Goal Last practice recorded BP readings:  BP Readings from Last 3 Encounters:           01/31/23         115-135/77-84 09/02/22        123/70 12/02/22  126/77  Most recent eGFR/CrCl:  Lab Results  Component Value Date   EGFR 73 02/08/2022    No components found for: "CRCL"  Evaluation of current treatment plan related to hypertension self management and patient's adherence to plan as established by provider Reviewed medications with patient and discussed importance of compliance Discussed plans with patient for ongoing care management follow up and provided patient with direct contact information for care management team Advised patient, providing education and rationale, to monitor blood pressure daily and record, calling PCP for findings outside established parameters Reviewed scheduled/upcoming provider appointments  Assessed social determinant of health barriers   Patient Goals/Self-Care Activities Over the next 60 days, patient will:  -Attends all scheduled provider appointments Calls pharmacy for medication refills Calls provider office for new concerns or questions  Follow Up Plan: The Managed Medicaid care management team will reach out to the patient again over the next 60 business  days.  The patient has been provided with contact information for the Managed Medicaid care management team and has been advised to call with any health related questions or concerns.

## 2023-02-02 DIAGNOSIS — E1140 Type 2 diabetes mellitus with diabetic neuropathy, unspecified: Secondary | ICD-10-CM | POA: Diagnosis not present

## 2023-02-02 DIAGNOSIS — D352 Benign neoplasm of pituitary gland: Secondary | ICD-10-CM | POA: Diagnosis not present

## 2023-02-02 DIAGNOSIS — E2749 Other adrenocortical insufficiency: Secondary | ICD-10-CM | POA: Diagnosis not present

## 2023-02-02 NOTE — Progress Notes (Signed)
Internal Medicine Clinic Attending  I was physically present during the key portions of the resident provided service and participated in the medical decision making of patient's management care. I reviewed pertinent patient test results.  The assessment, diagnosis, and plan were formulated together and I agree with the documentation in the resident's note.  Reviewed recent cardiology notes and patient with chronic non anginal chest pain.  This appears to be similar to previous.  She is requested to follow up with her cardiologist which should take place in November.   Inez Catalina, MD

## 2023-02-13 ENCOUNTER — Other Ambulatory Visit: Payer: Self-pay | Admitting: Allergy and Immunology

## 2023-02-14 ENCOUNTER — Other Ambulatory Visit: Payer: Self-pay

## 2023-02-18 ENCOUNTER — Other Ambulatory Visit: Payer: Self-pay | Admitting: Surgical

## 2023-02-20 ENCOUNTER — Other Ambulatory Visit: Payer: Self-pay | Admitting: *Deleted

## 2023-02-20 ENCOUNTER — Telehealth: Payer: Self-pay | Admitting: Allergy and Immunology

## 2023-02-20 MED ORDER — NEURONTIN 100 MG PO CAPS: ORAL_CAPSULE | ORAL | 1 refills | Status: DC

## 2023-02-20 MED ORDER — NEURONTIN 100 MG PO CAPS: ORAL_CAPSULE | ORAL | 2 refills | Status: DC

## 2023-02-20 NOTE — Telephone Encounter (Signed)
Medication refill on Gabapentin 100 mg sent to pharmacy

## 2023-02-20 NOTE — Telephone Encounter (Signed)
Patient called to get the medication Gabapentin sent to  [facility], [place] - [phone] [place] (Ph: [phone])

## 2023-02-21 ENCOUNTER — Other Ambulatory Visit: Payer: Self-pay

## 2023-02-21 ENCOUNTER — Other Ambulatory Visit (HOSPITAL_COMMUNITY): Payer: Self-pay

## 2023-02-21 NOTE — Progress Notes (Signed)
Specialty Pharmacy Refill Coordination Note  [name] [name] is a 62 y.o. female assessed today regarding refills of clinic administered specialty medication(s) Cabotegravir & Rilpivirine   Clinic requested Courier to Provider Office   Delivery date: 03/01/23   Verified address: [facility] [address] [place] [address]   Medication will be filled on 02/28/23.

## 2023-02-22 ENCOUNTER — Other Ambulatory Visit: Payer: Self-pay | Admitting: Surgical

## 2023-02-22 MED ORDER — DICLOFENAC SODIUM 50 MG PO TBEC: 50.0000 mg | DELAYED_RELEASE_TABLET | Freq: Two times a day (BID) | ORAL | 0 refills | Status: DC

## 2023-02-27 ENCOUNTER — Ambulatory Visit: Payer: Medicaid Other | Attending: Physician Assistant | Admitting: Physician Assistant

## 2023-02-27 ENCOUNTER — Encounter: Payer: Self-pay | Admitting: Physician Assistant

## 2023-02-27 VITALS — BP 124/70 | HR 78 | Ht 61.0 in | Wt 169.4 lb

## 2023-02-27 DIAGNOSIS — R0602 Shortness of breath: Secondary | ICD-10-CM | POA: Diagnosis not present

## 2023-02-27 DIAGNOSIS — R072 Precordial pain: Secondary | ICD-10-CM

## 2023-02-27 DIAGNOSIS — I10 Essential (primary) hypertension: Secondary | ICD-10-CM | POA: Diagnosis not present

## 2023-02-27 DIAGNOSIS — R079 Chest pain, unspecified: Secondary | ICD-10-CM | POA: Diagnosis not present

## 2023-02-27 DIAGNOSIS — E785 Hyperlipidemia, unspecified: Secondary | ICD-10-CM | POA: Diagnosis not present

## 2023-02-27 DIAGNOSIS — E1169 Type 2 diabetes mellitus with other specified complication: Secondary | ICD-10-CM | POA: Diagnosis not present

## 2023-02-27 MED ORDER — METOPROLOL TARTRATE 50 MG PO TABS: 50.0000 mg | ORAL_TABLET | Freq: Two times a day (BID) | ORAL | 3 refills | Status: DC

## 2023-02-27 NOTE — Assessment & Plan Note (Signed)
Blood pressure is well-controlled.  She is only taking metoprolol once a day now.   -Increase metoprolol to 50 mg twice daily -Continue amlodipine 10 mg daily, HCTZ 25 mg daily

## 2023-02-27 NOTE — Assessment & Plan Note (Signed)
LDL in October 2023 was 75.  If she has significant coronary calcification or CAD on coronary CTA, will need to adjust her medical therapy to get LDL at least less than 70.

## 2023-02-27 NOTE — Progress Notes (Addendum)
Cardiology Office Note:    Date:  02/27/2023  ID:  [name], DOB 1960-09-26, MRN [id number] PCP: [name], MD  [facility] Providers Cardiologist:  Charlton Haws, MD       Patient Profile:      Chest pain  TTE 08/18/20: EF 60-65, no RWMA, moderate LVH, normal RVSF  Myoview 07/27/20: EF 62, No ischemia or infarction; low risk Diabetes mellitus  Hyperlipidemia  Hypertension  Asthma  Hepatitis C HIV Pituitary adenoma  Addison's disease; on steroids  GERD  Chronic kidney disease  Hx of Pancreatitis  Jehovah's witness  ABIs 12/24/21: normal         History of Present Illness:  Discussed the use of AI scribe software for clinical note transcription with the patient, who gave verbal consent to proceed.  [name] [name] is a 62 y.o. female who returns for evaluation of chest pain. She was last seen in 01/2022.  She is here alone.  Recently, her primary care provider switched her from metoprolol to carvedilol for improved blood pressure control.  She developed increased heart rate, shortness of breath, and chest pain with carvedilol. The chest discomfort is described as an ache in the lower left chest. This pain was accompanied by sharp, shooting sensations.  She changed back from carvedilol to metoprolol once a day. The patient reports that the symptoms have improved but persist. The chest pain and shortness of breath are more noticeable during activity, although the patient notes that she does not engage in much physical activity. The patient has a history of chest pain and shortness of breath, but reports that these symptoms have become more frequent recently. The patient has a family history of cardiac disease, with a brother who passed away from a heart attack two years ago.      Review of Systems  Gastrointestinal:  Negative for hematochezia and melena.  Genitourinary:  Negative for hematuria.  See HPI     Studies Reviewed:   EKG  Interpretation Date/Time:  Monday February 27 2023 15:32:52 EST Ventricular Rate:  78 PR Interval:  170 QRS Duration:  84 QT Interval:  438 QTC Calculation: 499 R Axis:   67  Text Interpretation: Normal sinus rhythm Prolonged QT No significant change since last tracing Confirmed by Tereso Newcomer [phone]) on 02/27/2023 3:56:36 PM             Risk Assessment/Calculations:             Physical Exam:   VS:  BP 124/70   Pulse 78   Ht 5\' 1"  (1.549 m)   Wt 169 lb 6.4 oz (76.8 kg)   SpO2 98%   BMI 32.01 kg/m    Wt Readings from Last 3 Encounters:  02/27/23 169 lb 6.4 oz (76.8 kg)  01/30/23 172 lb 12.8 oz (78.4 kg)  12/30/22 169 lb (76.7 kg)    Constitutional:      Appearance: Healthy appearance. Not in distress.  Neck:     Vascular: No carotid bruit or JVR. JVD normal.  Pulmonary:     Breath sounds: Normal breath sounds. No wheezing. No rales.  Cardiovascular:     Normal rate. Regular rhythm.     Murmurs: There is no murmur.  Edema:    Peripheral edema absent.  Abdominal:     Palpations: Abdomen is soft.        Assessment and Plan:   Assessment & Plan Precordial chest pain Intermittent chest pain, more frequent with exertion,  localized to the left lower chest, described as an ache with occasional sharp pains. EKG today is negative for acute changes.  She does have a family history of coronary artery disease.  She had a brother recently passed away from myocardial infarction.  She has undergone stress testing in the past.  Given her continued symptoms of chest discomfort and recent worsening symptoms, I have recommended proceeding with coronary CTA to better define her chest pain.  I have also recommended proceeding with a follow-up echocardiogram. -Arrange coronary CTA -Arrange 2D echocardiogram -Follow-up 6 months or sooner if significant CAD noted on CT SOB (shortness of breath) Echocardiogram in April 2022 demonstrated moderate LVH and normal EF.  Given her continued  shortness of breath with exertion, I have recommended proceeding with coronary CTA as noted as well as echocardiogram. Primary hypertension Blood pressure is well-controlled.  She is only taking metoprolol once a day now.   -Increase metoprolol to 50 mg twice daily -Continue amlodipine 10 mg daily, HCTZ 25 mg daily Hyperlipidemia associated with type 2 diabetes mellitus (HCC) LDL in October 2023 was 75.  If she has significant coronary calcification or CAD on coronary CTA, will need to adjust her medical therapy to get LDL at least less than 70.       Dispo:  Return in about 6 months (around 08/27/2023) for Routine Follow Up, w/ Tereso Newcomer, PA-C.  Signed, Tereso Newcomer, PA-C

## 2023-02-27 NOTE — Patient Instructions (Addendum)
Medication Instructions:  Your physician has recommended you make the following change in your medication:   INCREASE Metoprolol (Lopressor) to 50 mg taking 1 twice a day  *If you need a refill on your cardiac medications before your next appointment, please call your pharmacy*   Lab Work: TODAY:  BMET  If you have labs (blood work) drawn today and your tests are completely normal, you will receive your results only by: MyChart Message (if you have MyChart) OR A paper copy in the mail If you have any lab test that is abnormal or we need to change your treatment, we will call you to review the results.   Testing/Procedures: Your physician has requested that you have an echocardiogram. Echocardiography is a painless test that uses sound waves to create images of your heart. It provides your doctor with information about the size and shape of your heart and how well your heart's chambers and valves are working. This procedure takes approximately one hour. There are no restrictions for this procedure. Please do NOT wear cologne, perfume, aftershave, or lotions (deodorant is allowed). Please arrive 15 minutes prior to your appointment time.  Please note: We ask at that you not bring children with you during ultrasound (echo/ vascular) testing. Due to room size and safety concerns, children are not allowed in the ultrasound rooms during exams. Our front office staff cannot provide observation of children in our lobby area while testing is being conducted. An adult accompanying a patient to their appointment will only be allowed in the ultrasound room at the discretion of the ultrasound technician under special circumstances. We apologize for any inconvenience. Ho   Your physician has requested that you have cardiac CT. Cardiac computed tomography (CT) is a painless test that uses an x-ray machine to take clear, detailed pictures of your heart. For further information please visit  [email or web address] Please follow instruction sheet BELOW:    Your cardiac CT will be scheduled at one of the below locations:   [facility] [address] [place], [place] [address] [phone]  OR  [facility] [address] [place], [place] [address] [phone]  OR   [facility] [address] [place], [place] [address] [phone]  If scheduled at [facility], please arrive at the [facility]'s Entrance (Entrance C2) of [facility] 30 minutes prior to test start time. You can use the FREE valet parking offered at entrance C (encouraged to control the heart rate for the test)  Proceed to the [facility] Radiology Department (first floor) to check-in and test prep.  All radiology patients and guests should use entrance C2 at [facility], accessed from [facility], even though the hospital's physical address listed is [address].    If scheduled at [facility] or [facility], please arrive 15 mins early for check-in and test prep.  There is spacious parking and easy access to the radiology department from the [facility] Heart and Vascular entrance. Please enter here and check-in with the desk attendant.   Please follow these instructions carefully (unless otherwise directed):  An IV will be required for this test and Nitroglycerin will be given.   On the Night Before the Test: Be sure to Drink plenty of water. Do not consume any caffeinated/decaffeinated beverages or chocolate 12 hours prior to your test. Do not take any antihistamines 12 hours prior to your  test.  On the Day of the Test: Drink plenty of water until 1 hour prior to the test. Do not eat any food 1 hour prior to test. You may take your regular medications prior to the test.  Take metoprolol (Lopressor) 50 x's 2 two  hours prior to test THIS WILL BE THE ONLY METOPROLOL YOU WILL TAKE THIS DAY ONLY please HOLD Hydrochlorothiazide  on the morning of the test. FEMALES- please wear underwire-free bra if available, avoid dresses & tight clothing   After the Test: Drink plenty of water. After receiving IV contrast, you may experience a mild flushed feeling. This is normal. On occasion, you may experience a mild rash up to 24 hours after the test. This is not dangerous. If this occurs, you can take Benadryl 25 mg and increase your fluid intake. If you experience trouble breathing, this can be serious. If it is severe call 911 IMMEDIATELY. If it is mild, please call our office. If you take any of these medications: Glipizide/Metformin, Avandament, Glucavance, please do not take 48 hours after completing test unless otherwise instructed.  We will call to schedule your test 2-4 weeks out understanding that some insurance companies will need an authorization prior to the service being performed.   For more information and frequently asked questions, please visit our website : [email or web address]  For non-scheduling related questions, please contact the cardiac imaging nurse navigator should you have any questions/concerns: Cardiac Imaging Nurse Navigators Direct Office Dial: [phone]   For scheduling needs, including cancellations and rescheduling, please call [place], [phone].    Follow-Up: At [facility], you and your health needs are our priority.  As part of our continuing mission to provide you with exceptional heart care, we have created designated Provider Care Teams.  These Care Teams include your primary Cardiologist (physician) and Advanced Practice Providers (APPs -  Physician Assistants and Nurse Practitioners) who all work together to provide you with the care you need, when you need it.  We recommend signing up for the patient portal called "MyChart".  Sign up  information is provided on this After Visit Summary.  MyChart is used to connect with patients for Virtual Visits (Telemedicine).  Patients are able to view lab/test results, encounter notes, upcoming appointments, etc.  Non-urgent messages can be sent to your provider as well.   To learn more about what you can do with MyChart, go to [email or web address].    Your next appointment:   6 month(s)  Provider:   Charlton Haws, MD  or Tereso Newcomer, PA-C         Other Instructions

## 2023-02-28 ENCOUNTER — Other Ambulatory Visit: Payer: Self-pay

## 2023-02-28 ENCOUNTER — Encounter: Payer: Self-pay | Admitting: Allergy and Immunology

## 2023-02-28 ENCOUNTER — Ambulatory Visit: Payer: Medicaid Other | Admitting: Allergy and Immunology

## 2023-02-28 ENCOUNTER — Other Ambulatory Visit: Payer: Self-pay | Admitting: *Deleted

## 2023-02-28 ENCOUNTER — Telehealth: Payer: Self-pay | Admitting: *Deleted

## 2023-02-28 VITALS — BP 124/80 | HR 98 | Temp 98.4°F | Ht 61.0 in | Wt 170.2 lb

## 2023-02-28 DIAGNOSIS — J4540 Moderate persistent asthma, uncomplicated: Secondary | ICD-10-CM | POA: Diagnosis not present

## 2023-02-28 DIAGNOSIS — J3089 Other allergic rhinitis: Secondary | ICD-10-CM

## 2023-02-28 DIAGNOSIS — L299 Pruritus, unspecified: Secondary | ICD-10-CM | POA: Diagnosis not present

## 2023-02-28 DIAGNOSIS — E099 Drug or chemical induced diabetes mellitus without complications: Secondary | ICD-10-CM | POA: Diagnosis not present

## 2023-02-28 DIAGNOSIS — K219 Gastro-esophageal reflux disease without esophagitis: Secondary | ICD-10-CM

## 2023-02-28 DIAGNOSIS — Z79899 Other long term (current) drug therapy: Secondary | ICD-10-CM

## 2023-02-28 DIAGNOSIS — Z888 Allergy status to other drugs, medicaments and biological substances status: Secondary | ICD-10-CM

## 2023-02-28 LAB — BASIC METABOLIC PANEL
BUN/Creatinine Ratio: 14 (ref 12–28)
BUN: 13 mg/dL (ref 8–27)
CO2: 20 mmol/L (ref 20–29)
Calcium: 9.5 mg/dL (ref 8.7–10.3)
Chloride: 97 mmol/L (ref 96–106)
Creatinine, Ser: 0.91 mg/dL (ref 0.57–1.00)
Glucose: 120 mg/dL — ABNORMAL HIGH (ref 70–99)
Potassium: 3.5 mmol/L (ref 3.5–5.2)
Sodium: 139 mmol/L (ref 134–144)
eGFR: 72 mL/min/{1.73_m2} (ref >59)

## 2023-02-28 MED ORDER — PANTOPRAZOLE SODIUM 40 MG PO TBEC: 40.0000 mg | DELAYED_RELEASE_TABLET | Freq: Two times a day (BID) | ORAL | 1 refills | Status: DC

## 2023-02-28 MED ORDER — FAMOTIDINE 40 MG PO TABS: 40.0000 mg | ORAL_TABLET | Freq: Every evening | ORAL | 1 refills | Status: DC

## 2023-02-28 MED ORDER — ALBUTEROL SULFATE HFA 108 (90 BASE) MCG/ACT IN AERS: 2.0000 | INHALATION_SPRAY | RESPIRATORY_TRACT | 1 refills | Status: DC | PRN

## 2023-02-28 MED ORDER — POTASSIUM CHLORIDE CRYS ER 20 MEQ PO TBCR: 40.0000 meq | EXTENDED_RELEASE_TABLET | Freq: Two times a day (BID) | ORAL | 3 refills | Status: DC

## 2023-02-28 MED ORDER — POTASSIUM CHLORIDE CRYS ER 20 MEQ PO TBCR: 40.0000 meq | EXTENDED_RELEASE_TABLET | Freq: Every day | ORAL | 3 refills | Status: DC

## 2023-02-28 MED ORDER — SYMBICORT 160-4.5 MCG/ACT IN AERO: 2.0000 | INHALATION_SPRAY | Freq: Two times a day (BID) | RESPIRATORY_TRACT | 1 refills | Status: DC

## 2023-02-28 MED ORDER — MONTELUKAST SODIUM 10 MG PO TABS: 10.0000 mg | ORAL_TABLET | Freq: Every evening | ORAL | 1 refills | Status: DC

## 2023-02-28 MED ORDER — TRIAMCINOLONE ACETONIDE 55 MCG/ACT NA AERO: 1.0000 | INHALATION_SPRAY | Freq: Two times a day (BID) | NASAL | 1 refills | Status: DC

## 2023-02-28 MED ORDER — CETIRIZINE HCL 10 MG PO TABS: 20.0000 mg | ORAL_TABLET | Freq: Two times a day (BID) | ORAL | 1 refills | Status: DC

## 2023-02-28 MED ORDER — NEURONTIN 100 MG PO CAPS: ORAL_CAPSULE | ORAL | 0 refills | Status: DC

## 2023-02-28 NOTE — Addendum Note (Signed)
Addended by: Burnetta Sabin on: 02/28/2023 01:17 PM   Modules accepted: Orders

## 2023-02-28 NOTE — Progress Notes (Unsigned)
[name] [name] - [place] - [place] [facility] [place]   Follow-up Note  Referring Provider: Evlyn Kanner, MD Primary Provider: [name], MD Date of Office Visit: 02/28/2023  Subjective:   [name] [name] (DOB: 1961-02-22) is a 62 y.o. female who [facility] in re-evaluation of the following:  HPI: [name] [name] returns to this clinic in evaluation of asthma, allergic rhinitis, LPR, pruritic disorder.  I last saw her in this clinic 30 Aug 2022.  Her asthma has been doing very well and she has not required a systemic steroid to treat an exacerbation and she rarely uses any short acting bronchodilator while she continues on Symbicort mostly 1 time per day.  She has had very little issues with her upper airways other than the fact that her nose is somewhat dry.  She does not really use any nasal steroids.  She has not required an antibiotic treat an episode of sinusitis.  Recently she did have some shortness of breath and left sided chest pain for which she saw cardiology yesterday.  Apparently this left-sided chest pain started when she was given carvedilol to replace her metoprolol and that agent has since been discontinued and she is back on metoprolol as of yesterday.  She has an echocardiogram and coronary artery morphology study scheduled for the near future.  She believes that her reflux is under very good control.  Her pruritic disorder is under excellent control while using a combination of 200 mg twice a day +400 mg in the evening of gabapentin.  She also continues on cetirizine at least twice a day.  She is obtained this years flu vaccine.  Allergies as of 02/28/2023       Reactions   Acetaminophen Other (See Comments)   Inflamed liver, hospitalized Other Reaction(s): liver demage   Morphine Sulfate Hives, Shortness Of Breath   Triamterene Hives   Aspirin-caffeine Other (See Comments)   liver damage; upset  stomach Other Reaction(s): liver demage   Dyazide [hydrochlorothiazide W-triamterene] Hives   Empagliflozin Diarrhea   (Jardiance) stomach ache   Metformin Hcl Er Other (See Comments), Nausea Only   upset stomach   Topamax [topiramate] Other (See Comments)   Vision disturbances.   Citalopram Itching, Rash, Other (See Comments)   Emtricitabine-tenofovir Df Rash   Descovy   Lisinopril Other (See Comments)   Cough    Losartan Nausea Only, Rash   Pt had rash, worsening dizziness, and nausea after starting losartan, which improved after stopping losartan   Triamterene-hctz Rash        Medication List    Accu-Chek Guide test strip Generic drug: glucose blood use to check blood sugar In Vitro Once a day DX: E11.9 for 90 days   amLODipine 10 MG tablet Commonly known as: NORVASC TAKE ONE TABLET BY MOUTH DAILY   atorvastatin 10 MG tablet Commonly known as: LIPITOR TAKE HALF TABLET BY MOUTH DAILY   cabotegravir & rilpivirine ER 600 & 900 MG/3ML injection Commonly known as: CABENUVA Inject 1 kit into the muscle every 8 (eight) weeks.   cetirizine 10 MG tablet Commonly known as: ZYRTEC Take 1-2 tablets 1-2 times a day for itch   chlorhexidine 0.12 % solution Commonly known as: PERIDEX USE AS DIRECTED 15 MLS IN THE MOUTH OR THROAT TWICE A DAY   cholecalciferol 25 MCG (1000 UNIT) tablet Commonly known as: VITAMIN D3 Take 3 tablets (3,000 Units total) by mouth daily.   ciclopirox 8 % solution  Commonly known as: PENLAC APPLY OVER NAIL AND SURROUNDING SKIN AT BEDTIME OVER PREVIOUS COAT. REMOVE AFTER 7DAYS/REPEAT   ciprofloxacin-dexamethasone OTIC suspension Commonly known as: CIPRODEX Place 4 drops into the left ear as needed (ear infection).   conjugated estrogens vaginal cream Commonly known as: PREMARIN Place 1 Applicatorful vaginally 2 (two) times a week.   diclofenac 50 MG EC tablet Commonly known as: VOLTAREN Take 1 tablet (50 mg total) by mouth 2 (two) times  daily.   doxycycline 100 MG capsule Commonly known as: MONODOX Take 100 mg by mouth 2 (two) times daily.   famotidine 40 MG tablet Commonly known as: PEPCID Take 1 tablet (40 mg total) by mouth at bedtime.   Global Ease Inject Pen Needles 32G X 4 MM Misc Generic drug: Insulin Pen Needle USE AS DIRECTED WITH VICTOZA for 30   hydrochlorothiazide 25 MG tablet Commonly known as: HYDRODIURIL TAKE ONE TABLET BY MOUTH DAILY   hydrocortisone 5 MG tablet Commonly known as: CORTEF Take 2.5-5 mg by mouth See admin instructions. Take 1 tablet (5 mg) by mouth in the morning (scheduled) & may taken an additional 0.5 tablet (2.5 mg) by mouth in the evening if needed.   liraglutide 18 MG/3ML Sopn Commonly known as: VICTOZA Inject 1.8 mg into the skin every evening.   Lomotil 2.5-0.025 MG tablet Generic drug: diphenoxylate-atropine Take by mouth 4 (four) times daily as needed for diarrhea or loose stools.   metoprolol tartrate 50 MG tablet Commonly known as: LOPRESSOR Take 1 tablet (50 mg total) by mouth 2 (two) times daily.   montelukast 10 MG tablet Commonly known as: SINGULAIR Take 1 tablet (10 mg total) by mouth at bedtime.   Neurontin 100 MG capsule Generic drug: gabapentin TAKE ONE TO TWO CAPSULES BY MOUTH THREE TO FOUR TIMES DAILY   nitroGLYCERIN 0.4 MG SL tablet Commonly known as: NITROSTAT Place 1 tablet (0.4 mg total) under the tongue every 5 (five) minutes as needed for chest pain.   nortriptyline 10 MG capsule Commonly known as: PAMELOR Take 2 capsules (20 mg total) by mouth at bedtime.   ondansetron 4 MG tablet Commonly known as: Zofran Take 1 tablet (4 mg total) by mouth every 8 (eight) hours as needed for nausea or vomiting.   pantoprazole 40 MG tablet Commonly known as: PROTONIX TAKE ONE TABLET BY MOUTH TWICE A DAY   potassium chloride SA 20 MEQ tablet Commonly known as: KLOR-CON M Take 2 tablets (40 mEq total) by mouth 2 (two) times daily. What changed:   how much to take when to take this Changed by: RMA Garner Gavel   Restasis 0.05 % ophthalmic emulsion Generic drug: cycloSPORINE Place 1 drop into both eyes 2 (two) times daily.   sertraline 100 MG tablet Commonly known as: Zoloft Take 1 tablet (100 mg total) by mouth daily.   sodium chloride 0.65 % Soln nasal spray Commonly known as: OCEAN PLACE 1 SPRAY INTO BOTH NOSTRILS AS NEEDED FOR CONGESTION.   SUMAtriptan 25 MG tablet Commonly known as: Imitrex Take 1 tablet (25 mg total) by mouth every 2 (two) hours as needed for migraine. May repeat in 2 hours if headache persists or recurs.   Symbicort 160-4.5 MCG/ACT inhaler Generic drug: budesonide-formoterol Inhale 2 puffs into the lungs 2 (two) times daily. INHALE TWO PUFFS INTO THE LUNGS IN THE MORNING AND AT BEDTIME   triamcinolone 55 MCG/ACT Aero nasal inhaler Commonly known as: NASACORT Place 1 spray into the nose 2 (two) times daily. 1 spray  each nostril 2 times per day   Ventolin HFA 108 (90 Base) MCG/ACT inhaler Generic drug: albuterol INHALE TWO PUFFS BY MOUTH INTO THE LUNGS EVERY 4 HOURS AS NEEDED FOR WHEEZING OR SHORTNESS OF BREATH    Past Medical History:  Diagnosis Date   Allergic rhinitis 05/09/2006   Allergy    Anxiety    Arthritis    Asthma    CHF (congestive heart failure) (HCC)    Chronic back pain    Diabetes mellitus without complication (HCC)    GERD (gastroesophageal reflux disease)    Heart murmur    as a child   Hepatitis C    genotype 1b, 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 s/p 12 week course of simeprevir and sofosbuvir between October 2014 and January 2015 with resolution.   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   Pneumonia    Prediabetes    Refusal of blood transfusions as patient is Jehovah's Witness    Secondary adrenal insufficiency (HCC) 06/29/2014    Urticaria     Past Surgical History:  Procedure Laterality Date   BUNIONECTOMY     b/l   COLECTOMY     2003 for diverticulitis, had colostomy bag and then reversed   COLONOSCOPY     HAND SURGERY     HAND SURGERY Right    right thumb surgery Dr Mina Marble 2021   NASAL SINUS SURGERY     SHOULDER ARTHROSCOPY WITH OPEN ROTATOR CUFF REPAIR AND DISTAL CLAVICLE ACROMINECTOMY Left 08/31/2021   Procedure: LEFT SHOULDER ARTHROSCOPY, DEBRIDEMENT,  ROTATOR CUFF TEAR REPAIR;  Surgeon: Cammy Copa, MD;  Location: [facility] OR;  Service: Orthopedics;  Laterality: Left;   SHOULDER SURGERY     left   TONSILLECTOMY      Review of systems negative except as noted in HPI / PMHx or noted below:  Review of Systems  Constitutional: Negative.   HENT: Negative.    Eyes: Negative.   Respiratory: Negative.    Cardiovascular: Negative.   Gastrointestinal: Negative.   Genitourinary: Negative.   Musculoskeletal: Negative.   Skin: Negative.   Neurological: Negative.   Endo/Heme/Allergies: Negative.   Psychiatric/Behavioral: Negative.       Objective:   Vitals:   02/28/23 1051  BP: 124/80  Pulse: 98  Temp: 98.4 F (36.9 C)  SpO2: 98%   Height: 5\' 1"  (154.9 cm)  Weight: 170 lb 3.2 oz (77.2 kg)   Physical Exam Constitutional:      Appearance: She is not diaphoretic.  HENT:     Head: Normocephalic.     Right Ear: Tympanic membrane, ear canal and external ear normal.     Left Ear: Tympanic membrane, ear canal and external ear normal.     Nose: Nose normal. No mucosal edema or rhinorrhea.     Mouth/Throat:     Pharynx: Uvula midline. No oropharyngeal exudate.  Eyes:     Conjunctiva/sclera: Conjunctivae normal.  Neck:     Thyroid: No thyromegaly.     Trachea: Trachea normal. No tracheal tenderness or tracheal deviation.  Cardiovascular:     Rate and Rhythm: Normal rate and regular rhythm.     Heart sounds: Normal heart sounds, S1 normal and S2 normal. No murmur heard. Pulmonary:      Effort: No respiratory distress.  Breath sounds: Normal breath sounds. No stridor. No wheezing or rales.  Lymphadenopathy:     Head:     Right side of head: No tonsillar adenopathy.     Left side of head: No tonsillar adenopathy.     Cervical: No cervical adenopathy.  Skin:    Findings: No erythema or rash.     Nails: There is no clubbing.  Neurological:     Mental Status: She is alert.     Diagnostics: Spirometry was performed and demonstrated an FEV1 of 1.71 at 90 % of predicted.  Assessment and Plan:   1. Asthma, moderate persistent, well-controlled   2. Other allergic rhinitis   3. LPRD (laryngopharyngeal reflux disease)   4. Pruritic disorder    1.  Continue to Treat and prevent inflammation:   A.  Montelukast 10 mg - 1 tablet 1 time per day  B.  Symbicort 160 - 2 inhalations 1-2 times per day  C.  Nasacort -1 spray each nostril 1-2 times per day  2.  Continue to Treat and prevent reflux:   A.  Protonix 40 mg 1-2 times per day  B.  Famotidine 40 mg in p.m.    3.  Continue to treat and prevent itchiness:    A. cetirizine 10 mg -1-2 tablets 1-2 times a day (MAY CAUSE DRYNESS)  B. Gabapentin 100 mg - 200 / 200 / 400  4.  If needed:   A.  OTC Benadryl  B.  OTC nasal saline  C.  Pro Air HFA 2 puffs every 4-6 hours  D.  Nasal saline  5. Return to clinic in 6 months or earlier if problem  Avangeline appears to be doing pretty well regarding her respiratory tract inflammatory condition and her reflux induced respiratory disease and her pruritic disorder on the plan noted above.  I did mention to her that cetirizine may be making her nose a little bit too dry and she can attempt to taper down this antihistamine.  It really appears as though the gabapentin is giving rise to good control of her pruritic disorder.  I will see her back in this clinic in 6 months or earlier if there is a problem.  Laurette Schimke, MD Allergy / Immunology [facility]

## 2023-02-28 NOTE — Patient Instructions (Signed)
  1.  Continue to Treat and prevent inflammation:   A.  Montelukast 10 mg - 1 tablet 1 time per day  B.  Symbicort 160 - 2 inhalations 1-2 times per day  C.  Nasacort -1 spray each nostril 1-2 times per day  2.  Continue to Treat and prevent reflux:   A.  Protonix 40 mg 1-2 times per day  B.  Famotidine 40 mg in p.m.    3.  Continue to treat and prevent itchiness:    A. cetirizine 10 mg -1-2 tablets 1-2 times a day (MAY CAUSE DRYNESS)  B. Gabapentin 100 mg - 200 / 200 / 400  4.  If needed:   A.  OTC Benadryl  B.  OTC nasal saline  C.  Pro Air HFA 2 puffs every 4-6 hours  D.  Nasal saline  5. Return to clinic in 6 months or earlier if problem

## 2023-02-28 NOTE — Telephone Encounter (Signed)
-----   Message from Tereso Newcomer sent at 02/28/2023  8:18 AM EST ----- Results sent to [facility] via MyChart. See MyChart comments below. PLAN:  -Increase K+ to 40 mEq daily -BMET 1 week  Ms. Doddridge  Your kidney function (creatinine) is normal.  Your potassium is low normal.  I think we should increase her potassium dose to 40 mEq daily.  I will repeat a lab on you in 1 week to recheck potassium. Tereso Newcomer, PA-C

## 2023-03-01 ENCOUNTER — Other Ambulatory Visit (HOSPITAL_COMMUNITY): Payer: Self-pay

## 2023-03-01 ENCOUNTER — Other Ambulatory Visit: Payer: Self-pay

## 2023-03-01 DIAGNOSIS — R110 Nausea: Secondary | ICD-10-CM

## 2023-03-01 MED ORDER — ONDANSETRON HCL 4 MG PO TABS: 4.0000 mg | ORAL_TABLET | Freq: Three times a day (TID) | ORAL | 0 refills | Status: DC | PRN

## 2023-03-01 NOTE — Progress Notes (Signed)
Specialty Pharmacy Refill Coordination Note  [name] [name] is a 61 y.o. female assessed today regarding refills of clinic administered specialty medication(s) Cabotegravir & Rilpivirine   Clinic requested Courier to Provider Office   Delivery date: 03/03/23   Verified address: [facility] [address] [place] [address]   Medication will be filled on 03/02/23.

## 2023-03-02 ENCOUNTER — Telehealth: Payer: Self-pay

## 2023-03-02 ENCOUNTER — Encounter: Payer: Self-pay | Admitting: Allergy and Immunology

## 2023-03-02 ENCOUNTER — Other Ambulatory Visit: Payer: Self-pay

## 2023-03-02 DIAGNOSIS — H2513 Age-related nuclear cataract, bilateral: Secondary | ICD-10-CM | POA: Diagnosis not present

## 2023-03-02 DIAGNOSIS — E119 Type 2 diabetes mellitus without complications: Secondary | ICD-10-CM | POA: Diagnosis not present

## 2023-03-02 DIAGNOSIS — H1045 Other chronic allergic conjunctivitis: Secondary | ICD-10-CM | POA: Diagnosis not present

## 2023-03-02 LAB — HM DIABETES EYE EXAM

## 2023-03-02 NOTE — Telephone Encounter (Signed)
 [facility] Patient Advocate Encounter  Patient's medications CABENUVA have been couriered to [facility] from [facility] and will be administered at the patients appointment on 03/06/23.  Kae Heller, CPhT Specialty Pharmacy Patient [facility] Phone: [phone] Fax:  [phone]

## 2023-03-03 ENCOUNTER — Other Ambulatory Visit: Payer: Self-pay

## 2023-03-03 ENCOUNTER — Other Ambulatory Visit (HOSPITAL_COMMUNITY): Payer: Self-pay

## 2023-03-06 ENCOUNTER — Other Ambulatory Visit (HOSPITAL_BASED_OUTPATIENT_CLINIC_OR_DEPARTMENT_OTHER): Payer: Self-pay

## 2023-03-06 ENCOUNTER — Ambulatory Visit: Payer: Medicaid Other | Admitting: Infectious Diseases

## 2023-03-06 ENCOUNTER — Other Ambulatory Visit: Payer: Self-pay

## 2023-03-06 ENCOUNTER — Encounter: Payer: Self-pay | Admitting: Infectious Diseases

## 2023-03-06 VITALS — BP 138/84 | HR 80 | Temp 98.0°F | Wt 168.0 lb

## 2023-03-06 DIAGNOSIS — B20 Human immunodeficiency virus [HIV] disease: Secondary | ICD-10-CM | POA: Diagnosis not present

## 2023-03-06 DIAGNOSIS — Z7185 Encounter for immunization safety counseling: Secondary | ICD-10-CM

## 2023-03-06 DIAGNOSIS — E876 Hypokalemia: Secondary | ICD-10-CM | POA: Diagnosis not present

## 2023-03-06 DIAGNOSIS — Z23 Encounter for immunization: Secondary | ICD-10-CM

## 2023-03-06 MED ORDER — CABOTEGRAVIR & RILPIVIRINE ER 600 & 900 MG/3ML IM SUER
1.0000 | Freq: Once | INTRAMUSCULAR | Status: AC
  Administered 2023-03-06: 1 via INTRAMUSCULAR

## 2023-03-06 NOTE — Patient Instructions (Signed)
Will forward the result to your cardiology team about the chemistry test.   Your next appointment with [place] is 04/25/2023 with our pharmacy team.   Please stop by the lab on your way out.

## 2023-03-06 NOTE — Progress Notes (Signed)
Subjective:    [name], female    DOB: [birth year], 62 y.o.   MRN: [id number]  Discussed the use of AI scribe software for clinical note transcription with the patient, who gave verbal consent to proceed.   Chief Complaint  Patient presents with   Follow-up      Brief Narrative:  [name] [name] is a 62 y.o. female with well controlled HIV Dx 1992 stage 3. She is a type 2 diabetic, on chronic prednisone for Addison's disease a h/o pituitary adenoma.  History of treated Hepatitis C (completed 04/2013 with SVR).   Previous regimens for HIV treatment: Lexiva/Norvir/Truvada, Isentress/Truvada (rashes) Prezcobix/Truvada (rash) Biktarvy 2018 - 2024 Cabenuva 2024    History of Present Illness: The patient, with a history of HIV, diabetes, and adrenal disease, presented for a routine follow-up. They requested assistance with paperwork for a credit protection program, seeking a six-month extension due to their medical conditions.  Switched from metoprolol to carvedilol recently and had some possible adverse effects, including rapid heart rate and aching in the lower part of their heart, leading to a switch back to metoprolol. The patient also reported sharp pains shooting through their heart, particularly during exertion. She also reported shortness of breath, particularly during exertion, such as making their bed. They denied any swelling in their legs or significant cough. They have a history of congestive heart failure but did not believe their current symptoms were related to this condition. [name] [name] has been working with her cardiology team and has an echo and coronary CT scheduled soon for further evaluation. The patient has a family history of heart disease, with a brother who had a heart attack before passing away.  The patient's last stress test, conducted two years ago, showed no evidence of ischemia or clotting.  The patient is currently on Cabenuva for HIV management and  reported no adverse effects from this medication. They also reported some weight loss, which they attributed to a decrease in appetite due to nausea.    Review of Systems  Constitutional:  Positive for appetite change and fatigue.  Respiratory:  Positive for shortness of breath. Negative for cough and wheezing.   Cardiovascular:  Positive for chest pain. Negative for palpitations and leg swelling.  Musculoskeletal:  Negative for joint swelling.  Neurological:  Positive for dizziness and light-headedness.       Objective:   Today's Vitals   03/06/23 0959  BP: 138/84  Pulse: 80  Temp: 98 F (36.7 C)  TempSrc: Oral  SpO2: 100%  Weight: 168 lb (76.2 kg)  PainSc: 0-No pain   Body mass index is 31.74 kg/m.    Physical Exam Constitutional:      Appearance: Normal appearance. She is not ill-appearing.  HENT:     Mouth/Throat:     Mouth: Mucous membranes are moist.     Pharynx: Oropharynx is clear.  Eyes:     General: No scleral icterus. Cardiovascular:     Rate and Rhythm: Normal rate and regular rhythm.     Pulses: Normal pulses.     Heart sounds: No murmur heard. Pulmonary:     Effort: Pulmonary effort is normal. No respiratory distress.  Abdominal:     General: Bowel sounds are normal. There is no distension.  Musculoskeletal:        General: No swelling.     Right lower leg: No edema.     Left lower leg: No edema.  Neurological:  Mental Status: She is oriented to person, place, and time.  Psychiatric:        Mood and Affect: Mood normal.        Thought Content: Thought content normal.    Vitals:   03/06/23 0959  Weight: 168 lb (76.2 kg)   Filed Weights   03/06/23 0959  Weight: 168 lb (76.2 kg)       Assessment & Plan:   Cardiac Concerns New onset chest pain, particularly with exertion. Recent medication change from Metoprolol to Carvedilol was not tolerated and has been switched back to Metoprolol. Cardiology is conducting further investigations  including a CT scan and echocardiogram. -Continue Metoprolol as directed by Cardiology. -Attend scheduled cardiac investigations. -Report any worsening symptoms to Cardiology team. -I don't suspect any correlation to cabenuva injections.   HIV Stable on Cabenuva injections with no reported side effects. Viral load was undetectable at last check two months ago. -Continue Cabenuva as prescribed. -Check viral load today along with CD4 - can drop back frequency after 8 months to ensure it is working well .  Diabetes No specific concerns raised during this visit. -Continue current management per primary care team.   Adrenal Condition No specific concerns raised during this visit. -Continue current management with rheumatology team.   General Health Maintenance -Administer final Prevnar vaccine today. -Complete paperwork for credit protection program, indicating patient's chronic conditions and expected duration of disability.   Rexene Alberts, MSN, NP-C [facility]  [email or web address] Pager: [phone] Office: [phone] [facility] Main Line: [phone]

## 2023-03-07 LAB — T-HELPER CELLS (CD4) COUNT (NOT AT ARMC)
CD4 % Helper T Cell: 29 % — ABNORMAL LOW (ref 33–65)
CD4 T Cell Abs: 516 /uL (ref 400–1790)

## 2023-03-08 ENCOUNTER — Other Ambulatory Visit (HOSPITAL_BASED_OUTPATIENT_CLINIC_OR_DEPARTMENT_OTHER): Payer: Self-pay

## 2023-03-08 LAB — BASIC METABOLIC PANEL
BUN: 17 mg/dL (ref 7–25)
CO2: 27 mmol/L (ref 20–32)
Calcium: 9.7 mg/dL (ref 8.6–10.4)
Chloride: 102 mmol/L (ref 98–110)
Creat: 0.84 mg/dL (ref 0.50–1.05)
Glucose, Bld: 95 mg/dL (ref 65–99)
Potassium: 3.6 mmol/L (ref 3.5–5.3)
Sodium: 141 mmol/L (ref 135–146)

## 2023-03-08 LAB — HIV-1 RNA QUANT-NO REFLEX-BLD
HIV 1 RNA Quant: NOT DETECTED {copies}/mL
HIV-1 RNA Quant, Log: NOT DETECTED {Log_copies}/mL

## 2023-03-09 NOTE — Progress Notes (Signed)
Pt has been made aware of normal result and verbalized understanding.  jw

## 2023-03-10 ENCOUNTER — Encounter: Payer: Self-pay | Admitting: Student

## 2023-03-13 ENCOUNTER — Other Ambulatory Visit: Payer: Self-pay | Admitting: Student

## 2023-03-13 ENCOUNTER — Encounter (HOSPITAL_COMMUNITY): Payer: Self-pay

## 2023-03-13 DIAGNOSIS — I10 Essential (primary) hypertension: Secondary | ICD-10-CM

## 2023-03-13 MED ORDER — AMLODIPINE BESYLATE 10 MG PO TABS: 10.0000 mg | ORAL_TABLET | Freq: Every day | ORAL | 3 refills | Status: DC

## 2023-03-15 ENCOUNTER — Ambulatory Visit (HOSPITAL_COMMUNITY)
Admission: RE | Admit: 2023-03-15 | Discharge: 2023-03-15 | Disposition: A | Discharge status: Home / Self Care | Payer: Medicaid Other | Source: Ambulatory Visit | Attending: Physician Assistant | Admitting: Physician Assistant

## 2023-03-15 DIAGNOSIS — R072 Precordial pain: Secondary | ICD-10-CM | POA: Insufficient documentation

## 2023-03-15 DIAGNOSIS — E785 Hyperlipidemia, unspecified: Secondary | ICD-10-CM | POA: Diagnosis not present

## 2023-03-15 DIAGNOSIS — I10 Essential (primary) hypertension: Secondary | ICD-10-CM | POA: Diagnosis not present

## 2023-03-15 DIAGNOSIS — R0602 Shortness of breath: Secondary | ICD-10-CM | POA: Diagnosis not present

## 2023-03-15 DIAGNOSIS — E1169 Type 2 diabetes mellitus with other specified complication: Secondary | ICD-10-CM | POA: Diagnosis not present

## 2023-03-15 MED ORDER — NITROGLYCERIN 0.4 MG SL SUBL
0.8000 mg | SUBLINGUAL_TABLET | Freq: Once | SUBLINGUAL | Status: AC
  Administered 2023-03-15: 0.8 mg via SUBLINGUAL

## 2023-03-15 MED ORDER — IOHEXOL 350 MG/ML SOLN
95.0000 mL | Freq: Once | INTRAVENOUS | Status: AC | PRN
  Administered 2023-03-15: 95 mL via INTRAVENOUS

## 2023-03-15 MED ORDER — NITROGLYCERIN 0.4 MG SL SUBL
SUBLINGUAL_TABLET | SUBLINGUAL | Status: AC
  Filled 2023-03-15: qty 2

## 2023-03-15 MED ORDER — DILTIAZEM HCL 25 MG/5ML IV SOLN
10.0000 mg | Freq: Once | INTRAVENOUS | Status: AC
  Administered 2023-03-15: 10 mg via INTRAVENOUS

## 2023-03-15 MED ORDER — METOPROLOL TARTRATE 5 MG/5ML IV SOLN
INTRAVENOUS | Status: AC
  Filled 2023-03-15: qty 10

## 2023-03-15 MED ORDER — DILTIAZEM HCL 25 MG/5ML IV SOLN
INTRAVENOUS | Status: AC
  Filled 2023-03-15: qty 5

## 2023-03-15 MED ORDER — METOPROLOL TARTRATE 5 MG/5ML IV SOLN
10.0000 mg | Freq: Once | INTRAVENOUS | Status: AC
  Administered 2023-03-15: 10 mg via INTRAVENOUS

## 2023-03-19 ENCOUNTER — Encounter: Payer: Self-pay | Admitting: Physician Assistant

## 2023-03-19 DIAGNOSIS — I2510 Atherosclerotic heart disease of native coronary artery without angina pectoris: Secondary | ICD-10-CM

## 2023-03-19 HISTORY — DX: Atherosclerotic heart disease of native coronary artery without angina pectoris: I25.10

## 2023-03-20 ENCOUNTER — Telehealth: Payer: Self-pay | Admitting: Cardiovascular Disease

## 2023-03-20 NOTE — Telephone Encounter (Signed)
Returned call to pt and she has been made aware of her lab results.

## 2023-03-20 NOTE — Telephone Encounter (Signed)
Returning call to nurse for results

## 2023-03-21 ENCOUNTER — Other Ambulatory Visit: Payer: Self-pay | Admitting: *Deleted

## 2023-03-21 ENCOUNTER — Telehealth: Payer: Self-pay | Admitting: *Deleted

## 2023-03-21 DIAGNOSIS — E1169 Type 2 diabetes mellitus with other specified complication: Secondary | ICD-10-CM

## 2023-03-21 DIAGNOSIS — I10 Essential (primary) hypertension: Secondary | ICD-10-CM

## 2023-03-21 MED ORDER — EZETIMIBE 10 MG PO TABS: 10.0000 mg | ORAL_TABLET | Freq: Every day | ORAL | 3 refills | Status: DC

## 2023-03-21 NOTE — Telephone Encounter (Signed)
-----   Message from [place] sent at 03/20/2023 12:01 PM EST ----- Ok. -DC ASA -If she has never tried Zetia, start Zetia 10 mg once daily and repeat Lipids, ALT along with Lp(a) in 3 mos -If she has tried Zetia and cannot tolerate, refer to PharmD Lipid clinic for PCSK9 inhibitor. Tereso Newcomer, PA-C    03/20/2023 12:01 PM

## 2023-03-22 ENCOUNTER — Encounter: Payer: Self-pay | Admitting: Dietician

## 2023-03-24 ENCOUNTER — Other Ambulatory Visit: Payer: Self-pay | Admitting: Obstetrics and Gynecology

## 2023-03-24 NOTE — Patient Outreach (Signed)
Medicaid Managed Care   Nurse Care Manager Note  03/24/2023 Name:  [name] [name] MRN:  [id number] DOB:  19-Dec-1960  [name] [name] is an 62 y.o. year old female who is a primary patient of [name], MD.  The Medicaid Managed Care Coordination team was consulted for assistance with:    Chronic healthcare management needs, DM, HTN, osteopenia, HIV, Addison's disease, HLD, rhinitis, asthma, Hepatitis C, CKD, LPRD, piituatary adenoma.  [name] [name] was given information about Medicaid Managed Care Coordination team services today. [name] [name] Patient agreed to services and verbal consent obtained.  Engaged with patient by telephone for follow up visit in response to provider referral for case management and/or care coordination services.   Assessments/Interventions:  Review of past medical history, allergies, medications, health status, including review of consultants reports, laboratory and other test data, was performed as part of comprehensive evaluation and provision of chronic care management services.  SDOH (Social Determinants of Health) assessments and interventions performed: SDOH Interventions    Flowsheet Row Patient Outreach Telephone from 03/24/2023 in [place] POPULATION HEALTH DEPARTMENT Patient Outreach Telephone from 02/01/2023 in [place] POPULATION HEALTH DEPARTMENT Patient Outreach Telephone from 12/02/2022 in [place] POPULATION HEALTH DEPARTMENT Patient Outreach Telephone from 09/02/2022 in [place] [facility] POP[facility] HEALTH DEPARTMENT Office Visit from 08/17/2022 in [facility] [place] Patient Outreach Telephone from 07/04/2022 in [place] POPULATION HEALTH DEPARTMENT  SDOH Interventions        Food Insecurity Interventions -- -- -- Intervention Not Indicated Intervention Not Indicated --  Housing Interventions -- -- -- Intervention Not Indicated Intervention Not Indicated --  Transportation Interventions --  Intervention Not Indicated -- -- -- --  Utilities Interventions -- Intervention Not Indicated -- -- Intervention Not Indicated --  Alcohol Usage Interventions Intervention Not Indicated (Score <7) -- -- -- Intervention Not Indicated (Score <7) Intervention Not Indicated (Score <7)  Financial Strain Interventions Intervention Not Indicated -- -- -- Intervention Not Indicated --  Physical Activity Interventions -- -- -- -- Intervention Not Indicated --  Stress Interventions -- -- -- -- Intervention Not Indicated Intervention Not Indicated  Health Literacy Interventions -- -- Intervention Not Indicated -- -- --     Care Plan Allergies  Allergen Reactions   Acetaminophen Other (See Comments)    Inflamed liver, hospitalized  Other Reaction(s): liver demage   Morphine Sulfate Hives and Shortness Of Breath   Triamterene Hives   Aspirin-Caffeine Other (See Comments)    liver damage; upset stomach  Other Reaction(s): liver demage   Dyazide [Hydrochlorothiazide W-Triamterene] Hives   Empagliflozin Diarrhea    (Jardiance) stomach ache   Metformin Hcl Er Other (See Comments) and Nausea Only    upset stomach   Topamax [Topiramate] Other (See Comments)    Vision disturbances.   Citalopram Itching, Rash and Other (See Comments)   Emtricitabine-Tenofovir Df Rash    Descovy   Lisinopril Other (See Comments)    Cough    Losartan Nausea Only and Rash    Pt had rash, worsening dizziness, and nausea after starting losartan, which improved after stopping losartan   Triamterene-Hctz Rash    Medications Reviewed Today     Reviewed by [name], RN (Registered Nurse) on 03/24/23 at [phone]  Med List Status: <None>   Medication Order Taking? Sig Documenting Provider Last Dose Status Informant  ACCU-CHEK GUIDE test strip [id number] No use to check blood sugar In Vitro Once a  day DX: E11.9 for 90 days [provider] Taking Active   albuterol (VENTOLIN HFA) 108 (90 Base) MCG/ACT inhaler  [id number] No Inhale 2 puffs into the lungs every 4 (four) hours as needed for wheezing or shortness of breath. Kozlow, Alvira Philips, MD Taking Active   amLODipine (NORVASC) 10 MG tablet [id number]  Take 1 tablet (10 mg total) by mouth daily. [name], MD  Active   atorvastatin (LIPITOR) 10 MG tablet [id number] No TAKE HALF TABLET BY MOUTH DAILY Katsadouros, Vasilios, MD Taking Active Self  cabotegravir & rilpivirine ER (CABENUVA) 600 & 900 MG/3ML injection [id number] No Inject 1 kit into the muscle every 8 (eight) weeks. Blanchard Kelch, NP Taking Active   cetirizine (ZYRTEC) 10 MG tablet [id number] No Take 2 tablets (20 mg total) by mouth 2 (two) times daily. Take 1-2 tablets 1-2 times a day for itch Kozlow, Alvira Philips, MD Taking Active   chlorhexidine (PERIDEX) 0.12 % solution [id number] No USE AS DIRECTED 15 MLS IN THE MOUTH OR THROAT TWICE A DAY Dixon, Gomez Cleverly, NP Taking Active   cholecalciferol (VITAMIN D3) 25 MCG (1000 UNIT) tablet [id number] No Take 3 tablets (3,000 Units total) by mouth daily. Modena Slater, DO Taking Active   ciclopirox (PENLAC) 8 % solution [id number] No APPLY OVER NAIL AND SURROUNDING SKIN AT BEDTIME OVER PREVIOUS COAT. REMOVE AFTER 7DAYS/REPEAT Vivi Barrack, DPM Taking Active   ciprofloxacin-dexamethasone [facility]) OTIC suspension [id number] No Place 4 drops into the left ear as needed (ear infection). [provider] Taking Active   conjugated estrogens (PREMARIN) vaginal cream [id number] No Place 1 Applicatorful vaginally 2 (two) times a week. Blanchard Kelch, NP Taking Active   diclofenac (VOLTAREN) 50 MG EC tablet [id number] No Take 1 tablet (50 mg total) by mouth 2 (two) times daily. Magnant, Joycie Peek, PA-C Taking Active   diphenoxylate-atropine (LOMOTIL) 2.5-0.025 MG tablet [id number] No Take by mouth 4 (four) times daily as needed for diarrhea or loose stools. [provider] Taking Active   doxycycline (MONODOX) 100 MG capsule [id number] No  Take 100 mg by mouth 2 (two) times daily. [provider] Taking Active   ezetimibe (ZETIA) 10 MG tablet [id number]  Take 1 tablet (10 mg total) by mouth daily. Tereso Newcomer T, PA-C  Active   famotidine (PEPCID) 40 MG tablet [id number] No Take 1 tablet (40 mg total) by mouth at bedtime. Kozlow, Alvira Philips, MD Taking Active   GLOBAL EASE INJECT PEN NEEDLES 32G X 4 MM MISC [id number] No USE AS DIRECTED WITH VICTOZA for 30 [provider] Taking Active   hydrochlorothiazide (HYDRODIURIL) 25 MG tablet [id number] No TAKE ONE TABLET BY MOUTH DAILY Evlyn Kanner, MD Taking Active   hydrocortisone (CORTEF) 5 MG tablet [id number] No Take 2.5-5 mg by mouth See admin instructions. Take 1 tablet (5 mg) by mouth in the morning (scheduled) & may taken an additional 0.5 tablet (2.5 mg) by mouth in the evening if needed. [provider] Taking Active Self           Med Note Monico Hoar Feb 27, 2023  3:23 PM)    liraglutide (VICTOZA) 18 MG/3ML SOPN [id number] No Inject 1.8 mg into the skin every evening. [provider] Taking Active Self  metoprolol tartrate (LOPRESSOR) 50 MG tablet [id number] No Take 1 tablet (50 mg total) by mouth 2 (two) times daily. Tereso Newcomer T, PA-C Taking Active   montelukast (SINGULAIR) 10 MG tablet [id number] No Take 1 tablet (10  mg total) by mouth at bedtime. Jessica Priest, MD Taking Active   NEURONTIN 100 MG capsule [id number] No TAKE ONE TO TWO CAPSULES BY MOUTH THREE TO FOUR TIMES DAILY Kozlow, Alvira Philips, MD Taking Active   nitroGLYCERIN (NITROSTAT) 0.4 MG SL tablet [id number] No Place 1 tablet (0.4 mg total) under the tongue every 5 (five) minutes as needed for chest pain. Steffanie Rainwater, MD Taking Active   nortriptyline (PAMELOR) 10 MG capsule [id number] No Take 2 capsules (20 mg total) by mouth at bedtime. Levert Feinstein, MD Taking Active   ondansetron [facility]) 4 MG tablet [id number] No Take 1 tablet (4 mg total) by mouth every 8 (eight) hours as  needed for nausea or vomiting. Morrie Sheldon, MD Taking Active   pantoprazole (PROTONIX) 40 MG tablet [id number] No Take 1 tablet (40 mg total) by mouth 2 (two) times daily. Kozlow, Alvira Philips, MD Taking Active   potassium chloride SA (KLOR-CON M) 20 MEQ tablet [id number] No Take 2 tablets (40 mEq total) by mouth daily. Tereso Newcomer T, PA-C Taking Active   RESTASIS 0.05 % ophthalmic emulsion [id number] No Place 1 drop into both eyes 2 (two) times daily. [provider] Taking Active Self  sertraline (ZOLOFT) 100 MG tablet [id number] No Take 1 tablet (100 mg total) by mouth daily. Marrianne Mood, MD Taking Active   sodium chloride (OCEAN) 0.65 % SOLN nasal spray [id number] No PLACE 1 SPRAY INTO BOTH NOSTRILS AS NEEDED FOR CONGESTION. Camelia Phenes, DO Taking Active Self  SUMAtriptan (IMITREX) 25 MG tablet [id number] No Take 1 tablet (25 mg total) by mouth every 2 (two) hours as needed for migraine. May repeat in 2 hours if headache persists or recurs. Levert Feinstein, MD Taking Active Self  SYMBICORT 160-4.5 MCG/ACT inhaler [id number] No Inhale 2 puffs into the lungs 2 (two) times daily. INHALE TWO PUFFS INTO THE LUNGS IN THE MORNING AND AT BEDTIME Kozlow, Alvira Philips, MD Taking Active   triamcinolone (NASACORT) 55 MCG/ACT AERO nasal inhaler [id number] No Place 1 spray into the nose 2 (two) times daily. 1 spray each nostril 2 times per day Jessica Priest, MD Taking Active            Patient Active Problem List   Diagnosis Date Noted   CAD (coronary artery disease) 03/19/2023   Chest pain 01/30/2023   Type 2 diabetes mellitus without complication, without long-term current use of insulin (HCC) 01/30/2023   Vaginal atrophy 10/20/2022   Screening for cervical cancer 10/20/2022   Chronic night sweats 08/23/2022   Otitis externa of left ear 08/17/2022   Osteopenia after menopause 03/15/2022   Anxiety 03/15/2022   Prediabetes 02/18/2022   Impingement syndrome of left shoulder 07/01/2021    Grief reaction 06/08/2020   Osteoarthritis of thumb, right 11/29/2018   Chronic migraine w/o aura w/o status migrainosus, not intractable 08/27/2018   Central perforation of tympanic membrane of left ear 12/09/2016   Eustachian tube dysfunction, left 03/09/2016   Spondylosis of lumbar region without myelopathy or radiculopathy 10/08/2015   Liver fibrosis (HCC) 12/04/2014   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   Vitamin D deficiency 06/29/2014   Secondary adrenal insufficiency (HCC) 06/29/2014   History of tympanostomy tube placement 02/07/2014   Hepatitis C virus infection cured after antiviral drug therapy 03/16/2012   Health care maintenance 12/15/2011   HTN (hypertension) 10/05/2010   Hyperlipidemia associated with type 2 diabetes mellitus (HCC) 10/23/2008   HIV disease (HCC) 05/09/2006  Allergic rhinitis 05/09/2006   GERD 05/09/2006   Conditions to be addressed/monitored per PCP order:  Chronic healthcare management needs, DM, HTN, osteopenia, HIV, Addison's disease, HLD, rhinitis, asthma, Hepatitis C, CKD, LPRD, piituatary adenoma.  Care Plan : General Plan of Care (Adult)  Updates made by [name], RN since 03/24/2023 12:00 AM     Problem: Health Promotion or Disease Self-Management (General Plan of Care)   Priority: Medium  Onset Date: 08/10/2020     Long-Range Goal: Self-Management Plan Developed   Start Date: 05/07/2020  Expected End Date: 05/04/2023  Recent Progress: On track  Priority: Medium  Note:   Current Barriers:  Chronic Disease Management support and education needs related to DM, HTN, asthma, GERD, anxiety, osteoarthritis, HLD, HIV 03/24/23:  headache this morning.  BP and BG stable.  Ongoing left shoulder pain for which she is followed by pain management.  Some chest pain and SOB-followed by CARDS-stable.  Recent coronary CTA-to have ECHO 12/13.   Nurse Case Manager Clinical Goal(s):  Over the  next 30 days, patient will attend all scheduled medical appointments  Interventions:  Inter-disciplinary care team collaboration (see longitudinal plan of care) Evaluation of current treatment plan and patient's adherence to plan as established by provider. Reviewed medications with patient. Collaborated with pharmacy regarding medications. Discussed plans with patient for ongoing care management follow up and provided patient with direct contact information for care management team Reviewed scheduled/upcoming provider appointments. Pharmacy referral for medication review Patient provided with [facility] transportation information.  Asthma: (Status:New goal.) Long Term Goal Provided education about and advised patient to utilize infection prevention strategies to reduce risk of respiratory infection Discussed the importance of adequate rest and management of fatigue with Asthma Assessed social determinant of health barriers   Diabetes Interventions:  (Status:  New goal.) Long Term Goal Assessed patient's understanding of A1c goal: <7% Reviewed medications with patient and discussed importance of medication adherence Counseled on importance of regular laboratory monitoring as prescribed Discussed plans with patient for ongoing care management follow up and provided patient with direct contact information for care management team Reviewed scheduled/upcoming provider appointments Review of patient status, including review of consultants reports, relevant laboratory and other test results, and medications completed Assessed social determinant of health barriers Lab Results  Component Value Date   HGBA1C 6.5 01/30/23       HGBA1C                                                      7.1                                    08/05/22      HGBA1C                                                      6.5                                    09/13/22  Hyperlipidemia Interventions:  (Status:  New  goal.) Long  Term Goal Medication review performed; medication list updated in electronic medical record.  Provider established cholesterol goals reviewed Counseled on importance of regular laboratory monitoring as prescribed Assessed social determinant of health barriers   Hypertension Interventions:  (Status:  New goal.) Long Term Goal Last practice recorded BP readings:  BP Readings from Last 3 Encounters:           01/31/23         115-135/[id number] 03/06/23         138/84 12/02/22        126/77  Most recent eGFR/CrCl:  Lab Results  Component Value Date   EGFR 73 02/08/2022    No components found for: "CRCL"  Evaluation of current treatment plan related to hypertension self management and patient's adherence to plan as established by provider Reviewed medications with patient and discussed importance of compliance Discussed plans with patient for ongoing care management follow up and provided patient with direct contact information for care management team Advised patient, providing education and rationale, to monitor blood pressure daily and record, calling PCP for findings outside established parameters Reviewed scheduled/upcoming provider appointments  Assessed social determinant of health barriers   Patient Goals/Self-Care Activities Over the next 60 days, patient will:  -Attends all scheduled provider appointments Calls pharmacy for medication refills Calls provider office for new concerns or questions  Follow Up Plan: The Managed Medicaid care management team will reach out to the patient again over the next 30 business  days.  The patient has been provided with contact information for the Managed Medicaid care management team and has been advised to call with any health related questions or concerns.    Follow Up:  Patient agrees to Care Plan and Follow-up.  Plan: The Managed Medicaid care management team will reach out to the patient again over the next 30 business  days. and The   Patient has been provided with contact information for the Managed Medicaid care management team and has been advised to call with any health related questions or concerns.  Date/time of next scheduled RN care management/care coordination outreach: 04/24/23

## 2023-03-24 NOTE — Patient Instructions (Signed)
Hi [name] [name], thanks for speaking with me this morning-I hope you feel better.  Have a good day and weekend!  [name] [name] was given information about Medicaid Managed Care team care coordination services as a part of their [facility] Medicaid benefit. [name] verbally consented to engagement with the [facility] Managed Care team.   If you are experiencing a medical emergency, please call 911 or report to your local emergency department or urgent care.   If you have a non-emergency medical problem during routine business hours, please contact your provider's office and ask to speak with a nurse.   For questions related to your [facility], please call: [phone] or visit the homepage here: [email or web address]  If you would like to schedule transportation through your [facility], please call the following number at least 2 days in advance of your appointment: [phone]   Rides for urgent appointments can also be made after hours by calling Member Services.  Call the Behavioral Health Crisis Line at [phone], at any time, 24 hours a day, 7 days a week. If you are in danger or need immediate medical attention call 911.  If you would like help to quit smoking, call 1-800-QUIT-NOW ((859)746-2471) OR Espaol: 1-855-Djelo-Ya (4-034-742-5956) o para ms informacin haga clic aqu or Text READY to 387-564 to register via text  [name] [name] - following are the goals we discussed in your visit today:   Goals Addressed             This Visit's Progress    Protect My Health       Timeframe:  Long-Range Goal Priority:  Medium Start Date:      05/07/20                       Expected End Date: ongoing             Follow Up Date: 04/24/23 - schedule appointment for vaccines needed due to my age or health - schedule recommended health tests (blood work, mammogram,  colonoscopy, pap test) - schedule and keep appointment for annual check-up    Why is this important?   Screening tests can find diseases early when they are easier to treat.  Your doctor or nurse will talk with you about which tests are important for you.  Getting shots for common diseases like the flu and shingles will help prevent them.   03/24/23:  Up to date on all appts.  Upcoming pain mgt appt and ECHO.   Patient verbalizes understanding of instructions and care plan provided today and agrees to view in MyChart. Active MyChart status and patient understanding of how to access instructions and care plan via MyChart confirmed with patient.     The Managed Medicaid care management team will reach out to the patient again over the next 30 business  days.  The  Patient has been provided with contact information for the Managed Medicaid care management team and has been advised to call with any health related questions or concerns.   [name] RN, BSN [place]  [facility] Care Management Coordinator - Managed Medicaid High Risk [phone]   Following is a copy of your plan of care:  Care Plan : General Plan of Care (Adult)  Updates made by [name], RN since 03/24/2023 12:00 AM     Problem: Health Promotion or Disease Self-Management (General Plan of Care)  Priority: Medium  Onset Date: 08/10/2020     Long-Range Goal: Self-Management Plan Developed   Start Date: 05/07/2020  Expected End Date: 05/04/2023  Recent Progress: On track  Priority: Medium  Note:   Current Barriers:  Chronic Disease Management support and education needs related to DM, HTN, asthma, GERD, anxiety, osteoarthritis, HLD, HIV 03/24/23:  headache this morning.  BP and BG stable.  Ongoing left shoulder pain for which she is followed by pain management.  Some chest pain and SOB-followed by CARDS-stable.  Recent coronary CTA-to have ECHO 12/13.   Nurse Case Manager Clinical Goal(s):   Over the next 30 days, patient will attend all scheduled medical appointments  Interventions:  Inter-disciplinary care team collaboration (see longitudinal plan of care) Evaluation of current treatment plan and patient's adherence to plan as established by provider. Reviewed medications with patient. Collaborated with pharmacy regarding medications. Discussed plans with patient for ongoing care management follow up and provided patient with direct contact information for care management team Reviewed scheduled/upcoming provider appointments. Pharmacy referral for medication review Patient provided with [facility] transportation information.  Asthma: (Status:New goal.) Long Term Goal Provided education about and advised patient to utilize infection prevention strategies to reduce risk of respiratory infection Discussed the importance of adequate rest and management of fatigue with Asthma Assessed social determinant of health barriers   Diabetes Interventions:  (Status:  New goal.) Long Term Goal Assessed patient's understanding of A1c goal: <7% Reviewed medications with patient and discussed importance of medication adherence Counseled on importance of regular laboratory monitoring as prescribed Discussed plans with patient for ongoing care management follow up and provided patient with direct contact information for care management team Reviewed scheduled/upcoming provider appointments Review of patient status, including review of consultants reports, relevant laboratory and other test results, and medications completed Assessed social determinant of health barriers Lab Results  Component Value Date   HGBA1C 6.5 01/30/23       HGBA1C                                                      7.1                                    08/05/22      HGBA1C                                                      6.5                                    09/13/22  Hyperlipidemia Interventions:  (Status:  New  goal.) Long Term Goal Medication review performed; medication list updated in electronic medical record.  Provider established cholesterol goals reviewed Counseled on importance of regular laboratory monitoring as prescribed Assessed social determinant of health barriers   Hypertension Interventions:  (Status:  New goal.) Long Term Goal Last practice recorded BP readings:  BP Readings from Last 3 Encounters:           01/31/23  191-478/29-56 03/06/23         138/84 12/02/22        126/77  Most recent eGFR/CrCl:  Lab Results  Component Value Date   EGFR 73 02/08/2022    No components found for: "CRCL"  Evaluation of current treatment plan related to hypertension self management and patient's adherence to plan as established by provider Reviewed medications with patient and discussed importance of compliance Discussed plans with patient for ongoing care management follow up and provided patient with direct contact information for care management team Advised patient, providing education and rationale, to monitor blood pressure daily and record, calling PCP for findings outside established parameters Reviewed scheduled/upcoming provider appointments  Assessed social determinant of health barriers   Patient Goals/Self-Care Activities Over the next 60 days, patient will:  -Attends all scheduled provider appointments Calls pharmacy for medication refills Calls provider office for new concerns or questions  Follow Up Plan: The Managed Medicaid care management team will reach out to the patient again over the next 30 business  days.  The patient has been provided with contact information for the Managed Medicaid care management team and has been advised to call with any health related questions or concerns.

## 2023-03-28 ENCOUNTER — Telehealth: Payer: Self-pay | Admitting: Allergy and Immunology

## 2023-03-28 ENCOUNTER — Other Ambulatory Visit: Payer: Self-pay | Admitting: Student

## 2023-03-28 MED ORDER — NEURONTIN 100 MG PO CAPS: ORAL_CAPSULE | ORAL | 0 refills | Status: DC

## 2023-03-28 NOTE — Telephone Encounter (Signed)
Sent in neutroin for pt to [facility]

## 2023-03-28 NOTE — Telephone Encounter (Signed)
Patient called stating she needed a refill on Neurontin sent to [facility]. Patient states she has run out of her medication and has started itching.

## 2023-03-28 NOTE — Progress Notes (Signed)
  PROCEDURE RECORD [facility]   Name: ERIKKA ZOUCHA DOB:[birth year] MRN: [id number]  Date:03/28/2023  Physician: Claudette Laws, MD    Nurse/CMA: Charise Carwin MA  Allergies:  Allergies  Allergen Reactions   Acetaminophen Other (See Comments)    Inflamed liver, hospitalized  Other Reaction(s): liver demage   Morphine Sulfate Hives and Shortness Of Breath   Triamterene Hives   Aspirin-Caffeine Other (See Comments)    liver damage; upset stomach  Other Reaction(s): liver demage   Dyazide [Hydrochlorothiazide W-Triamterene] Hives   Empagliflozin Diarrhea    (Jardiance) stomach ache   Metformin Hcl Er Other (See Comments) and Nausea Only    upset stomach   Topamax [Topiramate] Other (See Comments)    Vision disturbances.   Citalopram Itching, Rash and Other (See Comments)   Emtricitabine-Tenofovir Df Rash    Descovy   Lisinopril Other (See Comments)    Cough    Losartan Nausea Only and Rash    Pt had rash, worsening dizziness, and nausea after starting losartan, which improved after stopping losartan   Triamterene-Hctz Rash    Consent Signed: Yes.    Is patient diabetic? Yes.    CBG today? 124 on yesterday  Pregnant: No. LMP: No LMP recorded. Patient is postmenopausal. (age 30-55)  Anticoagulants: no Anti-inflammatory: yes Antibiotics: no  Procedure: Bilateral Sacroiliac Injection  Position: Supine Start Time: 11:11 am  End Time: 11:20 am  Fluoro Time: 26  RN/CMA Antwanette Wesche MA Shaquoya Cosper MA    Time 10:12 AM 11:21 AM    BP 123/76 125/75    Pulse 84 83    Respirations 16 16    O2 Sat 97 97    S/S 6 6    Pain Level 10/10 7/10     D/C home with Driver, [name] & O X 3, D/C instructions reviewed, and sits independently.         Subjective:    Patient ID: [name], female    DOB: 29-Dec-1960, 62 y.o.   MRN: [id number]  HPI    Review of Systems     Objective:   Physical Exam        Assessment & Plan:

## 2023-03-29 ENCOUNTER — Other Ambulatory Visit: Payer: Self-pay | Admitting: Surgical

## 2023-03-31 ENCOUNTER — Encounter: Payer: Medicaid Other | Attending: Physical Medicine & Rehabilitation | Admitting: Physical Medicine & Rehabilitation

## 2023-03-31 ENCOUNTER — Encounter: Payer: Self-pay | Admitting: Physical Medicine & Rehabilitation

## 2023-03-31 VITALS — BP 123/76 | HR 84 | Temp 98.4°F | Ht 61.0 in | Wt 168.0 lb

## 2023-03-31 DIAGNOSIS — M461 Sacroiliitis, not elsewhere classified: Secondary | ICD-10-CM

## 2023-03-31 MED ORDER — LIDOCAINE HCL 1 % IJ SOLN
5.0000 mL | Freq: Once | INTRAMUSCULAR | Status: AC
  Administered 2023-03-31: 5 mL

## 2023-03-31 MED ORDER — BETAMETHASONE SOD PHOS & ACET 6 (3-3) MG/ML IJ SUSP
6.0000 mg | Freq: Once | INTRAMUSCULAR | Status: AC
  Administered 2023-03-31: 6 mg via INTRA_ARTICULAR

## 2023-03-31 MED ORDER — LIDOCAINE HCL (PF) 2 % IJ SOLN
2.0000 mL | Freq: Once | INTRAMUSCULAR | Status: AC
  Administered 2023-03-31: 2 mL

## 2023-03-31 MED ORDER — IOHEXOL 180 MG/ML  SOLN
3.0000 mL | Freq: Once | INTRAMUSCULAR | Status: AC
  Administered 2023-03-31: 3 mL

## 2023-03-31 NOTE — Patient Instructions (Signed)
Sacroiliac injection was performed today. A combination of numbing medicine (lidocaine) plus a cortisone medicine (betamethasone) was injected. The injection was done under x-ray guidance. This procedure has been performed to help reduce low back and buttocks pain as well as potentially hip pain. The duration of this injection is variable lasting from hours to  Months. It may repeated if needed. 

## 2023-03-31 NOTE — Progress Notes (Signed)
Bilateral sacroiliac injections under fluoroscopic guidance  Indication: Low back and buttocks pain not relieved by medication management and other conservative care.Has had failure of PT and OTC meds.  Hx HIV, Last CD4 Nov  2024 516 Last injection 12/30/22 lasted ~6wks  Informed consent was obtained after describing risks and benefits of the procedure with the patient, this includes bleeding, bruising, infection, paralysis and medication side effects. The patient wishes to proceed and has given written consent. The patient was placed in a prone position. The lumbar and sacral area was marked and prepped with Betadine. A 25-gauge 1-1/2 inch needle was inserted into the skin and subcutaneous tissue and 1 mL of 1% lidocaine was injected into each side. Then a 25-gauge 3 inch spinal needle was inserted under fluoroscopic guidance into the left sacroiliac joint. AP and lateral images were utilized. Omnipaque 180x0.5 mL under live fluoroscopy demonstrated no intravascular uptake. Then a solution containing one ML of 6 mg per mL Celestone in 2 ML of 2% lidocaine MPF was injected x1.5 mL. This same procedure was repeated on the right side using the same needle, injectate, and technique. Patient tolerated the procedure well. Post procedure instructions were given. Please see post procedure form.

## 2023-04-07 ENCOUNTER — Ambulatory Visit (HOSPITAL_COMMUNITY): Payer: Medicaid Other | Attending: Cardiology

## 2023-04-07 DIAGNOSIS — R0602 Shortness of breath: Secondary | ICD-10-CM | POA: Diagnosis not present

## 2023-04-07 DIAGNOSIS — I10 Essential (primary) hypertension: Secondary | ICD-10-CM | POA: Insufficient documentation

## 2023-04-07 DIAGNOSIS — R072 Precordial pain: Secondary | ICD-10-CM | POA: Insufficient documentation

## 2023-04-07 DIAGNOSIS — E1169 Type 2 diabetes mellitus with other specified complication: Secondary | ICD-10-CM | POA: Diagnosis not present

## 2023-04-07 DIAGNOSIS — E785 Hyperlipidemia, unspecified: Secondary | ICD-10-CM | POA: Insufficient documentation

## 2023-04-07 LAB — ECHOCARDIOGRAM COMPLETE
Area-P 1/2: 3.66 cm2
S' Lateral: 2.2 cm

## 2023-04-10 ENCOUNTER — Other Ambulatory Visit: Payer: Self-pay | Admitting: Allergy and Immunology

## 2023-04-11 ENCOUNTER — Other Ambulatory Visit (HOSPITAL_COMMUNITY): Payer: Self-pay

## 2023-04-11 ENCOUNTER — Other Ambulatory Visit: Payer: Self-pay

## 2023-04-11 NOTE — Progress Notes (Signed)
Specialty Pharmacy Refill Coordination Note  [name] [name] is a 62 y.o. female assessed today regarding refills of clinic administered specialty medication(s) Cabotegravir & Rilpivirine [facility])   Clinic requested Courier to Provider Office   Delivery date: 04/24/23   Verified address: [address] [place] [address]   Medication will be filled on 04/21/23.

## 2023-04-20 ENCOUNTER — Other Ambulatory Visit: Payer: Self-pay | Admitting: Allergy and Immunology

## 2023-04-21 ENCOUNTER — Other Ambulatory Visit: Payer: Self-pay

## 2023-04-21 NOTE — Progress Notes (Signed)
 HPI: [name] [name] is a 62 y.o. female who presents to the [facility] pharmacy clinic for Cabenuva  administration.  Patient Active Problem List   Diagnosis Date Noted   CAD (coronary artery disease) 03/19/2023   Chest pain 01/30/2023   Type 2 diabetes mellitus without complication, without long-term current use of insulin  (HCC) 01/30/2023   Vaginal atrophy 10/20/2022   Screening for cervical cancer 10/20/2022   Chronic night sweats 08/23/2022   Otitis externa of left ear 08/17/2022   Osteopenia after menopause 03/15/2022   Anxiety 03/15/2022   Prediabetes 02/18/2022   Impingement syndrome of left shoulder 07/01/2021   Grief reaction 06/08/2020   Osteoarthritis of thumb, right 11/29/2018   Chronic migraine w/o aura w/o status migrainosus, not intractable 08/27/2018   Central perforation of tympanic membrane of left ear 12/09/2016   Eustachian tube dysfunction, left 03/09/2016   Spondylosis of lumbar region without myelopathy or radiculopathy 10/08/2015   Liver fibrosis (HCC) 12/04/2014   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   Vitamin D  deficiency 06/29/2014   Secondary adrenal insufficiency (HCC) 06/29/2014   History of tympanostomy tube placement 02/07/2014   Hepatitis C virus infection cured after antiviral drug therapy 03/16/2012   Health care maintenance 12/15/2011   HTN (hypertension) 10/05/2010   Hyperlipidemia associated with type 2 diabetes mellitus (HCC) 10/23/2008   HIV disease (HCC) 05/09/2006   Allergic rhinitis 05/09/2006   GERD 05/09/2006    Patient's Medications  New Prescriptions   No medications on file  Previous Medications   ACCU-CHEK GUIDE TEST STRIP    use to check blood sugar In Vitro Once a day DX: E11.9 for 90 days   AMLODIPINE  (NORVASC ) 10 MG TABLET    Take 1 tablet (10 mg total) by mouth daily.   ATORVASTATIN  (LIPITOR) 10 MG TABLET    TAKE HALF TABLET BY MOUTH DAILY   CABOTEGRAVIR  & RILPIVIRINE   ER (CABENUVA ) 600 & 900 MG/3ML INJECTION    Inject 1 kit into the muscle every 8 (eight) weeks.   CETIRIZINE  (ZYRTEC ) 10 MG TABLET    Take 2 tablets (20 mg total) by mouth 2 (two) times daily. Take 1-2 tablets 1-2 times a day for itch   CHLORHEXIDINE  (PERIDEX ) 0.12 % SOLUTION    USE AS DIRECTED 15 MLS IN THE MOUTH OR THROAT TWICE A DAY   CHOLECALCIFEROL  (VITAMIN D3) 25 MCG (1000 UNIT) TABLET    Take 3 tablets (3,000 Units total) by mouth daily.   CIPROFLOXACIN -DEXAMETHASONE  (CIPRODEX ) OTIC SUSPENSION    Place 4 drops into the left ear as needed (ear infection).   DICLOFENAC  (VOLTAREN ) 50 MG EC TABLET    TAKE 1 TABLET (50 MG TOTAL) BY MOUTH TWO (TWO) TIMES DAILY.   DIPHENOXYLATE -ATROPINE  (LOMOTIL ) 2.5-0.025 MG TABLET    Take by mouth 4 (four) times daily as needed for diarrhea or loose stools.   EZETIMIBE  (ZETIA ) 10 MG TABLET    Take 1 tablet (10 mg total) by mouth daily.   FAMOTIDINE  (PEPCID ) 40 MG TABLET    Take 1 tablet (40 mg total) by mouth at bedtime.   GLOBAL EASE INJECT PEN NEEDLES 32G X 4 MM MISC    USE AS DIRECTED WITH VICTOZA for 30   HYDROCHLOROTHIAZIDE  (HYDRODIURIL ) 25 MG TABLET    TAKE ONE TABLET BY MOUTH DAILY   HYDROCORTISONE  (CORTEF ) 5 MG TABLET    Take 2.5-5 mg by mouth See admin instructions. Take 1 tablet (5 mg) by mouth in the morning (scheduled) &  may taken an additional 0.5 tablet (2.5 mg) by mouth in the evening if needed.   LIRAGLUTIDE (VICTOZA) 18 MG/3ML SOPN    Inject 1.8 mg into the skin every evening.   METOPROLOL  TARTRATE (LOPRESSOR ) 50 MG TABLET    Take 1 tablet (50 mg total) by mouth 2 (two) times daily.   MONTELUKAST  (SINGULAIR ) 10 MG TABLET    Take 1 tablet (10 mg total) by mouth at bedtime.   NEURONTIN  100 MG CAPSULE    TAKE ONE TO TWO CAPSULES BY MOUTH THREE TO FOUR TIMES DAILY   NITROGLYCERIN  (NITROSTAT ) 0.4 MG SL TABLET    Place 1 tablet (0.4 mg total) under the tongue every 5 (five) minutes as needed for chest pain.   NORTRIPTYLINE  (PAMELOR ) 10 MG CAPSULE     Take 2 capsules (20 mg total) by mouth at bedtime.   ONDANSETRON  (ZOFRAN ) 4 MG TABLET    Take 1 tablet (4 mg total) by mouth every 8 (eight) hours as needed for nausea or vomiting.   PANTOPRAZOLE  (PROTONIX ) 40 MG TABLET    Take 1 tablet (40 mg total) by mouth 2 (two) times daily.   POTASSIUM CHLORIDE  ER 20 MEQ TBCR    SMARTSIG:1.0 Tablet(s) By Mouth Daily   POTASSIUM CHLORIDE  SA (KLOR-CON  M) 20 MEQ TABLET    Take 2 tablets (40 mEq total) by mouth daily.   RESTASIS 0.05 % OPHTHALMIC EMULSION    Place 1 drop into both eyes 2 (two) times daily.   SERTRALINE  (ZOLOFT ) 100 MG TABLET    Take 1 tablet (100 mg total) by mouth daily.   SODIUM CHLORIDE  (OCEAN) 0.65 % SOLN NASAL SPRAY    PLACE 1 SPRAY INTO BOTH NOSTRILS AS NEEDED FOR CONGESTION.   SUMATRIPTAN  (IMITREX ) 25 MG TABLET    Take 1 tablet (25 mg total) by mouth every 2 (two) hours as needed for migraine. May repeat in 2 hours if headache persists or recurs.   SYMBICORT  160-4.5 MCG/ACT INHALER    Inhale 2 puffs into the lungs 2 (two) times daily. INHALE TWO PUFFS INTO THE LUNGS IN THE MORNING AND AT BEDTIME   TRIAMCINOLONE  (NASACORT ) 55 MCG/ACT AERO NASAL INHALER    Place 1 spray into the nose 2 (two) times daily. 1 spray each nostril 2 times per day   VENTOLIN  HFA 108 (90 BASE) MCG/ACT INHALER    INHALE TWO PUFFS INTO THE LUNGS EVERY 4 (FOUR) HOURS AS NEEDED FOR WHEEZING OR SHORTNESS OF BREATH  Modified Medications   No medications on file  Discontinued Medications   No medications on file    Allergies: Allergies  Allergen Reactions   Acetaminophen  Other (See Comments)    Inflamed liver, hospitalized  Other Reaction(s): liver demage   Morphine  Sulfate Hives and Shortness Of Breath   Triamterene Hives   Aspirin -Caffeine Other (See Comments)    liver damage; upset stomach  Other Reaction(s): liver demage   Dyazide [Hydrochlorothiazide  W-Triamterene] Hives   Empagliflozin Diarrhea    (Jardiance) stomach ache   Metformin  Hcl Er Other (See  Comments) and Nausea Only    upset stomach   Topamax  [Topiramate ] Other (See Comments)    Vision disturbances.   Citalopram  Itching, Rash and Other (See Comments)   Emtricitabine -Tenofovir  Df Rash    Descovy    Lisinopril  Other (See Comments)    Cough    Losartan  Nausea Only and Rash    Pt had rash, worsening dizziness, and nausea after starting losartan , which improved after stopping losartan    Triamterene-Hctz  Rash    Labs: Lab Results  Component Value Date   HIV1RNAQUANT Not Detected 03/06/2023   HIV1RNAQUANT <20 (H) 12/28/2022   HIV1RNAQUANT Not Detected 02/08/2022   CD4TABS 516 03/06/2023   CD4TABS 487 02/08/2022   CD4TABS 608 02/02/2021    RPR and STI Lab Results  Component Value Date   LABRPR NON-REACTIVE 02/02/2021   LABRPR NON-REACTIVE 11/13/2019   LABRPR NON-REACTIVE 08/30/2018   LABRPR NON-REACTIVE 11/16/2017   LABRPR NON REAC 07/12/2016    STI Results GC CT  10/20/2022 10:51 AM Negative  Negative   02/02/2021 10:13 AM Negative  Negative   08/30/2018 12:00 AM Negative  Negative   01/11/2018 12:00 AM Negative  Negative   11/16/2017 12:00 AM Negative  Negative   03/25/2011  4:35 PM  NEGATIVE     Hepatitis B Lab Results  Component Value Date   HEPBSAB NEG 12/15/2011   HEPBSAG NEGATIVE 12/15/2011   Hepatitis C Lab Results  Component Value Date   HCVRNAPCRQN <15 NOT DETECTED 02/21/2017   Hepatitis A No results found for: HAV Lipids: Lab Results  Component Value Date   CHOL 149 02/18/2022   TRIG 200 (H) 02/18/2022   HDL 40 02/18/2022   CHOLHDL 3.7 02/18/2022   VLDL 44 (H) 07/12/2016   LDLCALC 75 02/18/2022    TARGET DATE: The 6th  Assessment: [name] [name] presents today for her maintenance Cabenuva  injections. Past injections were tolerated well without issues. Last HIV RNA was undetectable in October. Will continue checking with every injection until ~April, then can drop back to q6 month checks per [place].   Administered cabotegravir   600mg /20mL in left upper outer quadrant of the gluteal muscle. Administered rilpivirine  900 mg/3mL in the right upper outer quadrant of the gluteal muscle. No issues with injections. She will follow up in 2 months for next set of injections.  Plan: - Cabenuva  injections administered - HIV RNA today - Next injections scheduled for 06/27/23 - Call with any issues or questions  [name] [name], PharmD, BCIDP, AAHIVP, CPP Clinical Pharmacist Practitioner Infectious Diseases Clinical Pharmacist [facility]

## 2023-04-24 ENCOUNTER — Telehealth: Payer: Self-pay

## 2023-04-24 ENCOUNTER — Other Ambulatory Visit: Payer: Self-pay | Admitting: Obstetrics and Gynecology

## 2023-04-24 NOTE — Patient Instructions (Signed)
Hi [name] [name], I hope you feel better!  [name] [name] was given information about Medicaid Managed Care team care coordination services as a part of their [facility] Medicaid benefit. [name] [name] verbally consented to engagement with the [facility] Managed Care team.   If you are experiencing a medical emergency, please call 911 or report to your local emergency department or urgent care.   If you have a non-emergency medical problem during routine business hours, please contact your provider's office and ask to speak with a nurse.   For questions related to your [facility], please call: [phone] or visit the homepage here: [email or web address]  If you would like to schedule transportation through your [facility], please call the following number at least 2 days in advance of your appointment: [phone]   Rides for urgent appointments can also be made after hours by calling Member Services.  Call the Behavioral Health Crisis Line at [phone], at any time, 24 hours a day, 7 days a week. If you are in danger or need immediate medical attention call 911.  If you would like help to quit smoking, call 1-800-QUIT-NOW (4374560789) OR Espaol: 1-855-Djelo-Ya (5-956-387-5643) o para ms informacin haga clic aqu or Text READY to 329-518 to register via text  [name] [name] - following are the goals we discussed in your visit today:   Goals Addressed             This Visit's Progress    Protect My Health       Timeframe:  Long-Range Goal Priority:  Medium Start Date:      05/07/20                       Expected End Date: ongoing             Follow Up Date: 06/08/23  - schedule appointment for vaccines needed due to my age or health - schedule recommended health tests (blood work, mammogram, colonoscopy, pap test) - schedule and keep appointment for annual  check-up    Why is this important?   Screening tests can find diseases early when they are easier to treat.  Your doctor or nurse will talk with you about which tests are important for you.  Getting shots for common diseases like the flu and shingles will help prevent them.   04/24/23: Recent ENDO and pain mgt appt.  Upcoming ID and Audiology  appt   Patient verbalizes understanding of instructions and care plan provided today and agrees to view in MyChart. Active MyChart status and patient understanding of how to access instructions and care plan via MyChart confirmed with patient.     The Managed Medicaid care management team will reach out to the patient again over the next 45 business  days.  The  Patient  has been provided with contact information for the Managed Medicaid care management team and has been advised to call with any health related questions or concerns.   [name] RN, BSN [place]  [facility] Care Management Coordinator - Managed Medicaid High Risk [phone]   Following is a copy of your plan of care:  Care Plan : General Plan of Care (Adult)  Updates made by [name], RN since 04/24/2023 12:00 AM     Problem: Health Promotion or Disease Self-Management (General Plan of Care)   Priority: Medium  Onset Date: 08/10/2020     [facility]  Goal: Self-Management Plan Developed   Start Date: 05/07/2020  Expected End Date: 07/23/2023  Recent Progress: On track  Priority: Medium  Note:   Current Barriers:  Chronic Disease Management support and education needs related to DM, HTN, asthma, GERD, anxiety, osteoarthritis, HLD, HIV 04/24/23:  Thinks she has ear inf-using drops and has appt scheduled.  Left shoulder pain and neck pain-will f/u with Dr. Diamantina [place] office today.  Blood sugars 80--132.  BP stable-CP and SOB improved.  Nurse Case Manager Clinical Goal(s):  Over the next 30 days, patient will attend all scheduled medical  appointments  Interventions:  Inter-disciplinary care team collaboration (see longitudinal plan of care) Evaluation of current treatment plan and patient's adherence to plan as established by provider. Reviewed medications with patient. Collaborated with pharmacy regarding medications. Discussed plans with patient for ongoing care management follow up and provided patient with direct contact information for care management team Reviewed scheduled/upcoming provider appointments. Pharmacy referral for medication review Patient provided with [facility] transportation information.  Asthma: (Status:New goal.) Long Term Goal Provided education about and advised patient to utilize infection prevention strategies to reduce risk of respiratory infection Discussed the importance of adequate rest and management of fatigue with Asthma Assessed social determinant of health barriers   Diabetes Interventions:  (Status:  New goal.) Long Term Goal Assessed patient's understanding of A1c goal: <7% Reviewed medications with patient and discussed importance of medication adherence Counseled on importance of regular laboratory monitoring as prescribed Discussed plans with patient for ongoing care management follow up and provided patient with direct contact information for care management team Reviewed scheduled/upcoming provider appointments Review of patient status, including review of consultants reports, relevant laboratory and other test results, and medications completed Assessed social determinant of health barriers Lab Results  Component Value Date   HGBA1C 6.5 01/30/23       HGBA1C                                                      7.1                                    08/05/22      HGBA1C                                                      6.5                                    09/13/22  Hyperlipidemia Interventions:  (Status:  New goal.) Long Term Goal Medication review performed; medication list  updated in electronic medical record.  Provider established cholesterol goals reviewed Counseled on importance of regular laboratory monitoring as prescribed Assessed social determinant of health barriers   Hypertension Interventions:  (Status:  New goal.) Long Term Goal Last practice recorded BP readings:  BP Readings from Last 3 Encounters:           01/31/23         115-135/77-84 03/06/23  138/84 04/18/23        133/85  Most recent eGFR/CrCl:  Lab Results  Component Value Date   EGFR 73 02/08/2022    No components found for: "CRCL"  Evaluation of current treatment plan related to hypertension self management and patient's adherence to plan as established by provider Reviewed medications with patient and discussed importance of compliance Discussed plans with patient for ongoing care management follow up and provided patient with direct contact information for care management team Advised patient, providing education and rationale, to monitor blood pressure daily and record, calling PCP for findings outside established parameters Reviewed scheduled/upcoming provider appointments  Assessed social determinant of health barriers   Patient Goals/Self-Care Activities Over the next 60 days, patient will:  -Attends all scheduled provider appointments Calls pharmacy for medication refills Calls provider office for new concerns or questions 04/24/23-patient will schedule an appt with Dr. August Saucer.  Follow Up Plan: The Managed Medicaid care management team will reach out to the patient again over the next 45 business  days.  The patient has been provided with contact information for the Managed Medicaid care management team and has been advised to call with any health related questions or concerns.

## 2023-04-24 NOTE — Patient Outreach (Signed)
Medicaid Managed Care   Nurse Care Manager Note  04/24/2023 Name:  [name] [name] MRN:  [id number] DOB:  Jul 08, 1960  [name] [name] is an 62 y.o. year old female who is a primary patient of [name] Crocker, MD.  The [facility] Managed Care Coordination team was consulted for assistance with:    Chronic healthcare management needs, DM, HTN, osteopenia, HIV, Addisons, HLD, rhinitis, asthma, Hep C, CKD, LPRD, pituatary adenoma, neuropathy.  [name] [name] was given information about Medicaid Managed Care Coordination team services today. [name] [name] Patient agreed to services and verbal consent obtained.  Engaged with patient by telephone for follow up visit in response to provider referral for case management and/or care coordination services.   Patient is participating in a Managed Medicaid Plan:  Yes  Assessments/Interventions:  Review of past medical history, allergies, medications, health status, including review of consultants reports, laboratory and other test data, was performed as part of comprehensive evaluation and provision of chronic care management services.  SDOH (Social Drivers of Health) assessments and interventions performed: SDOH Interventions    Flowsheet Row Patient Outreach Telephone from 04/24/2023 in [place] [facility] POPULATION HEALTH DEPARTMENT Patient Outreach Telephone from 03/24/2023 in [place] POPULATION HEALTH DEPARTMENT Patient Outreach Telephone from 02/01/2023 in [place] POPULATION HEALTH DEPARTMENT Patient Outreach Telephone from 12/02/2022 in [place] POPULATION HEALTH DEPARTMENT Patient Outreach Telephone from 09/02/2022 in [place] HE[facility] POPULATION HEALTH DEPARTMENT Office Visit from 08/17/2022 in [facility] [employer]  SDOH Interventions        Food Insecurity Interventions -- -- -- -- Intervention Not Indicated Intervention Not Indicated  Housing Interventions -- -- -- -- Intervention Not Indicated  Intervention Not Indicated  Transportation Interventions -- -- Intervention Not Indicated -- -- --  Utilities Interventions -- -- Intervention Not Indicated -- -- Intervention Not Indicated  Alcohol Usage Interventions -- Intervention Not Indicated (Score <7) -- -- -- Intervention Not Indicated (Score <7)  Financial Strain Interventions -- Intervention Not Indicated -- -- -- Intervention Not Indicated  Physical Activity Interventions -- -- -- -- -- Intervention Not Indicated  Stress Interventions Intervention Not Indicated -- -- -- -- Intervention Not Indicated  Social Connections Interventions Intervention Not Indicated -- -- -- -- --  Health Literacy Interventions -- -- -- Intervention Not Indicated -- --     Care Plan Allergies  Allergen Reactions   Acetaminophen Other (See Comments)    Inflamed liver, hospitalized  Other Reaction(s): liver demage   Morphine Sulfate Hives and Shortness Of Breath   Triamterene Hives   Aspirin-Caffeine Other (See Comments)    liver damage; upset stomach  Other Reaction(s): liver demage   Dyazide [Hydrochlorothiazide W-Triamterene] Hives   Empagliflozin Diarrhea    (Jardiance) stomach ache   Metformin Hcl Er Other (See Comments) and Nausea Only    upset stomach   Topamax [Topiramate] Other (See Comments)    Vision disturbances.   Citalopram Itching, Rash and Other (See Comments)   Emtricitabine-Tenofovir Df Rash    Descovy   Lisinopril Other (See Comments)    Cough    Losartan Nausea Only and Rash    Pt had rash, worsening dizziness, and nausea after starting losartan, which improved after stopping losartan   Triamterene-Hctz Rash   Medications Reviewed Today     Reviewed by [name], RN (Registered Nurse) on 04/24/23 at 1444  Med List Status: <None>   Medication Order Taking? Sig Documenting Provider Last Dose  Status Informant  ACCU-CHEK GUIDE test strip [id number] No use to check blood sugar In Vitro Once a day DX: E11.9 for 90  days [provider] Taking Active   amLODipine (NORVASC) 10 MG tablet [id number] No Take 1 tablet (10 mg total) by mouth daily. [name] Crocker, MD Taking Active   atorvastatin (LIPITOR) 10 MG tablet [id number] No TAKE HALF TABLET BY MOUTH DAILY Katsadouros, Vasilios, MD Taking Active Self  cabotegravir & rilpivirine ER (CABENUVA) 600 & 900 MG/3ML injection [id number] No Inject 1 kit into the muscle every 8 (eight) weeks. Blanchard Kelch, NP Taking Active   cetirizine (ZYRTEC) 10 MG tablet [id number] No Take 2 tablets (20 mg total) by mouth 2 (two) times daily. Take 1-2 tablets 1-2 times a day for itch Kozlow, Alvira Philips, MD Taking Active   chlorhexidine (PERIDEX) 0.12 % solution [id number] No USE AS DIRECTED 15 MLS IN THE MOUTH OR THROAT TWICE A DAY Dixon, Gomez Cleverly, NP Taking Active   cholecalciferol (VITAMIN D3) 25 MCG (1000 UNIT) tablet [id number] No Take 3 tablets (3,000 Units total) by mouth daily. Modena Slater, DO Taking Active   ciprofloxacin-dexamethasone [facility]) OTIC suspension [id number] No Place 4 drops into the left ear as needed (ear infection). [provider] Taking Active   diclofenac (VOLTAREN) 50 MG EC tablet [id number] No TAKE 1 TABLET (50 MG TOTAL) BY MOUTH TWO (TWO) TIMES DAILY. Magnant, Joycie Peek, PA-C Taking Active   diphenoxylate-atropine (LOMOTIL) 2.5-0.025 MG tablet [id number] No Take by mouth 4 (four) times daily as needed for diarrhea or loose stools. [provider] Taking Active   ezetimibe (ZETIA) 10 MG tablet [id number] No Take 1 tablet (10 mg total) by mouth daily. Tereso Newcomer T, PA-C Taking Active   famotidine (PEPCID) 40 MG tablet [id number] No Take 1 tablet (40 mg total) by mouth at bedtime. Kozlow, Alvira Philips, MD Taking Active   GLOBAL EASE INJECT PEN NEEDLES 32G X 4 MM MISC [id number] No USE AS DIRECTED WITH VICTOZA for 30 [provider] Taking Active   hydrochlorothiazide (HYDRODIURIL) 25 MG tablet [id number] No TAKE ONE TABLET BY  MOUTH DAILY Evlyn Kanner, MD Taking Active   hydrocortisone (CORTEF) 5 MG tablet [id number] No Take 2.5-5 mg by mouth See admin instructions. Take 1 tablet (5 mg) by mouth in the morning (scheduled) & may taken an additional 0.5 tablet (2.5 mg) by mouth in the evening if needed. [provider] Taking Active Self           Med Note Monico Hoar Feb 27, 2023  3:23 PM)    liraglutide (VICTOZA) 18 MG/3ML SOPN [id number] No Inject 1.8 mg into the skin every evening. [provider] Taking Active Self  metoprolol tartrate (LOPRESSOR) 50 MG tablet [id number] No Take 1 tablet (50 mg total) by mouth 2 (two) times daily. Tereso Newcomer T, PA-C Taking Active   montelukast (SINGULAIR) 10 MG tablet [id number] No Take 1 tablet (10 mg total) by mouth at bedtime. Jessica Priest, MD Taking Active   NEURONTIN 100 MG capsule [id number] No TAKE ONE TO TWO CAPSULES BY MOUTH THREE TO FOUR TIMES DAILY Kozlow, Alvira Philips, MD Taking Active   nitroGLYCERIN (NITROSTAT) 0.4 MG SL tablet [id number] No Place 1 tablet (0.4 mg total) under the tongue every 5 (five) minutes as needed for chest pain. Steffanie Rainwater, MD Taking Active   nortriptyline (PAMELOR) 10 MG capsule [id number] No Take 2 capsules (20 mg total) by mouth at bedtime. Levert Feinstein, MD  Taking Active   ondansetron (ZOFRAN) 4 MG tablet [id number] No Take 1 tablet (4 mg total) by mouth every 8 (eight) hours as needed for nausea or vomiting. Morrie Sheldon, MD Taking Active   pantoprazole (PROTONIX) 40 MG tablet [id number] No Take 1 tablet (40 mg total) by mouth 2 (two) times daily. Jessica Priest, MD Taking Active   Potassium Chloride ER 20 MEQ TBCR [id number]  SMARTSIG:1.0 Tablet(s) By Mouth Daily [provider]  Active   potassium chloride SA (KLOR-CON M) 20 MEQ tablet [id number] No Take 2 tablets (40 mEq total) by mouth daily. Tereso Newcomer T, PA-C Taking Active   RESTASIS 0.05 % ophthalmic emulsion [id number] No Place 1 drop into both  eyes 2 (two) times daily. [provider] Taking Active Self  sertraline (ZOLOFT) 100 MG tablet [id number] No Take 1 tablet (100 mg total) by mouth daily. Marrianne Mood, MD Taking Active   sodium chloride (OCEAN) 0.65 % SOLN nasal spray [id number] No PLACE 1 SPRAY INTO BOTH NOSTRILS AS NEEDED FOR CONGESTION. Camelia Phenes, DO Taking Active Self  SUMAtriptan (IMITREX) 25 MG tablet [id number] No Take 1 tablet (25 mg total) by mouth every 2 (two) hours as needed for migraine. May repeat in 2 hours if headache persists or recurs. Levert Feinstein, MD Taking Active Self  SYMBICORT 160-4.5 MCG/ACT inhaler [id number] No Inhale 2 puffs into the lungs 2 (two) times daily. INHALE TWO PUFFS INTO THE LUNGS IN THE MORNING AND AT BEDTIME Kozlow, Alvira Philips, MD Taking Active   triamcinolone (NASACORT) 55 MCG/ACT AERO nasal inhaler [id number] No Place 1 spray into the nose 2 (two) times daily. 1 spray each nostril 2 times per day Jessica Priest, MD Taking Active   VENTOLIN HFA 108 (90 Base) MCG/ACT inhaler [id number]  INHALE TWO PUFFS INTO THE LUNGS EVERY 4 (FOUR) HOURS AS NEEDED FOR WHEEZING OR SHORTNESS OF BREATH Kozlow, Alvira Philips, MD  Active            Patient Active Problem List   Diagnosis Date Noted   CAD (coronary artery disease) 03/19/2023   Chest pain 01/30/2023   Type 2 diabetes mellitus without complication, without long-term current use of insulin (HCC) 01/30/2023   Vaginal atrophy 10/20/2022   Screening for cervical cancer 10/20/2022   Chronic night sweats 08/23/2022   Otitis externa of left ear 08/17/2022   Osteopenia after menopause 03/15/2022   Anxiety 03/15/2022   Prediabetes 02/18/2022   Impingement syndrome of left shoulder 07/01/2021   Grief reaction 06/08/2020   Osteoarthritis of thumb, right 11/29/2018   Chronic migraine w/o aura w/o status migrainosus, not intractable 08/27/2018   Central perforation of tympanic membrane of left ear 12/09/2016   Eustachian tube dysfunction,  left 03/09/2016   Spondylosis of lumbar region without myelopathy or radiculopathy 10/08/2015   Liver fibrosis (HCC) 12/04/2014   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   Vitamin D deficiency 06/29/2014   Secondary adrenal insufficiency (HCC) 06/29/2014   History of tympanostomy tube placement 02/07/2014   Hepatitis C virus infection cured after antiviral drug therapy 03/16/2012   Health care maintenance 12/15/2011   HTN (hypertension) 10/05/2010   Hyperlipidemia associated with type 2 diabetes mellitus (HCC) 10/23/2008   HIV disease (HCC) 05/09/2006   Allergic rhinitis 05/09/2006   GERD 05/09/2006   Conditions to be addressed/monitored per PCP order:  Chronic healthcare management needs, DM, HTN, osteopenia, HIV, Addisons, HLD, rhinitis, asthma, Hep C, CKD, LPRD, pituatary adenoma, neuropathy.  Care  Plan : General Plan of Care (Adult)  Updates made by [name], RN since 04/24/2023 12:00 AM     Problem: Health Promotion or Disease Self-Management (General Plan of Care)   Priority: Medium  Onset Date: 08/10/2020     Long-Range Goal: Self-Management Plan Developed   Start Date: 05/07/2020  Expected End Date: 07/23/2023  Recent Progress: On track  Priority: Medium  Note:   Current Barriers:  Chronic Disease Management support and education needs related to DM, HTN, asthma, GERD, anxiety, osteoarthritis, HLD, HIV 04/24/23:  Thinks she has ear inf-using drops and has appt scheduled.  Left shoulder pain and neck pain-will f/u with Dr. Diamantina [place] office today.  Blood sugars 80--132.  BP stable-CP and SOB improved.  Nurse Case Manager Clinical Goal(s):  Over the next 30 days, patient will attend all scheduled medical appointments  Interventions:  Inter-disciplinary care team collaboration (see longitudinal plan of care) Evaluation of current treatment plan and patient's adherence to plan as established by provider. Reviewed  medications with patient. Collaborated with pharmacy regarding medications. Discussed plans with patient for ongoing care management follow up and provided patient with direct contact information for care management team Reviewed scheduled/upcoming provider appointments. Pharmacy referral for medication review Patient provided with [facility] transportation information.  Asthma: (Status:New goal.) Long Term Goal Provided education about and advised patient to utilize infection prevention strategies to reduce risk of respiratory infection Discussed the importance of adequate rest and management of fatigue with Asthma Assessed social determinant of health barriers   Diabetes Interventions:  (Status:  New goal.) Long Term Goal Assessed patient's understanding of A1c goal: <7% Reviewed medications with patient and discussed importance of medication adherence Counseled on importance of regular laboratory monitoring as prescribed Discussed plans with patient for ongoing care management follow up and provided patient with direct contact information for care management team Reviewed scheduled/upcoming provider appointments Review of patient status, including review of consultants reports, relevant laboratory and other test results, and medications completed Assessed social determinant of health barriers Lab Results  Component Value Date   HGBA1C 6.5 01/30/23       HGBA1C                                                      7.1                                    08/05/22      HGBA1C                                                      6.5                                    09/13/22  Hyperlipidemia Interventions:  (Status:  New goal.) Long Term Goal Medication review performed; medication list updated in electronic medical record.  Provider established cholesterol goals reviewed Counseled on importance of regular laboratory monitoring as prescribed Assessed social determinant of health barriers    Hypertension Interventions:  (Status:  New goal.) Long Term Goal Last practice recorded BP readings:  BP Readings from Last 3 Encounters:           01/31/23         115-135/77-84 03/06/23         138/84 04/18/23        133/85  Most recent eGFR/CrCl:  Lab Results  Component Value Date   EGFR 73 02/08/2022    No components found for: "CRCL"  Evaluation of current treatment plan related to hypertension self management and patient's adherence to plan as established by provider Reviewed medications with patient and discussed importance of compliance Discussed plans with patient for ongoing care management follow up and provided patient with direct contact information for care management team Advised patient, providing education and rationale, to monitor blood pressure daily and record, calling PCP for findings outside established parameters Reviewed scheduled/upcoming provider appointments  Assessed social determinant of health barriers   Patient Goals/Self-Care Activities Over the next 60 days, patient will:  -Attends all scheduled provider appointments Calls pharmacy for medication refills Calls provider office for new concerns or questions 04/24/23-patient will schedule an appt with Dr. August Saucer.  Follow Up Plan: The Managed Medicaid care management team will reach out to the patient again over the next 45 business  days.  The patient has been provided with contact information for the Managed Medicaid care management team and has been advised to call with any health related questions or concerns.    Follow Up:  Patient agrees to Care Plan and Follow-up.  Plan: The Managed Medicaid care management team will reach out to the patient again over the next 45 business  days. and The  Patient has been provided with contact information for the Managed Medicaid care management team and has been advised to call with any health related questions or concerns.  Date/time of next scheduled RN  care management/care coordination outreach:  06/08/23 at 0900.

## 2023-04-24 NOTE — Telephone Encounter (Signed)
Is this ok to refill for the patient?  °

## 2023-04-24 NOTE — Telephone Encounter (Signed)
 [facility] Patient Advocate Encounter  Patient's medications CABENUVA have been couriered to [facility] from [facility] and will be administered at the patients appointment on 04/25/23.  Kae Heller, CPhT Specialty Pharmacy Patient [facility] Phone: [phone] Fax:  [phone]

## 2023-04-25 ENCOUNTER — Ambulatory Visit (INDEPENDENT_AMBULATORY_CARE_PROVIDER_SITE_OTHER): Payer: Medicaid Other | Admitting: Pharmacist

## 2023-04-25 ENCOUNTER — Other Ambulatory Visit: Payer: Self-pay

## 2023-04-25 DIAGNOSIS — B20 Human immunodeficiency virus [HIV] disease: Secondary | ICD-10-CM | POA: Diagnosis not present

## 2023-04-25 MED ORDER — CABOTEGRAVIR & RILPIVIRINE ER 600 & 900 MG/3ML IM SUER
1.0000 | Freq: Once | INTRAMUSCULAR | Status: AC
  Administered 2023-04-25: 1 via INTRAMUSCULAR

## 2023-04-25 MED ORDER — NEURONTIN 100 MG PO CAPS: ORAL_CAPSULE | ORAL | 0 refills | Status: DC

## 2023-04-27 ENCOUNTER — Ambulatory Visit: Payer: Medicaid Other | Admitting: Pharmacist

## 2023-04-27 LAB — HIV-1 RNA QUANT-NO REFLEX-BLD
HIV 1 RNA Quant: NOT DETECTED {copies}/mL
HIV-1 RNA Quant, Log: NOT DETECTED {Log}

## 2023-04-28 ENCOUNTER — Other Ambulatory Visit: Payer: Self-pay | Admitting: Surgical

## 2023-05-02 ENCOUNTER — Encounter: Payer: Self-pay | Admitting: Internal Medicine

## 2023-05-02 ENCOUNTER — Encounter: Payer: Medicaid Other | Admitting: Internal Medicine

## 2023-05-02 NOTE — Progress Notes (Deleted)
 CC: follow up  HPI:  [name] is a 63 y.o. with medical history of HTN, HLD, DMII, HIV, liver fibrosis 2/2 to treated Hep C presenting to [facility] for a follow up. Last seen 01/2023 and recommended to follow up in 3 months.   Please see problem-based list for further details, assessments, and plans.  Past Medical History:  Diagnosis Date   Allergic rhinitis 05/09/2006   Allergy     Anxiety    Arthritis    Asthma    CAD (coronary artery disease) 03/19/2023   CCTA 03/16/23: CAC score 471 (98th percentile), RCA diffuse 25-49, dLM minimal Ca2+ plaque, LAD prox minimal plaques, pLCx < 24; TPV 542 mm3 (86th percentile)    CHF (congestive heart failure) (HCC)    Chronic back pain    Diabetes mellitus without complication (HCC) 04/25/2014   GERD (gastroesophageal reflux disease)    Heart murmur    as a child   Hepatitis C    genotype 1b, 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 s/p 12 week course of simeprevir and sofosbuvir between October 2014 and January 2015 with resolution.   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   Pneumonia    Prediabetes    Refusal of blood transfusions as patient is Jehovah's Witness    Secondary adrenal insufficiency (HCC) 06/29/2014   Urticaria     Current Outpatient Medications (Endocrine & Metabolic):    hydrocortisone  (CORTEF ) 5 MG tablet, Take 2.5-5 mg by mouth See admin instructions. Take 1 tablet (5 mg) by mouth in the morning (scheduled) & may taken an additional 0.5 tablet (2.5 mg) by mouth in the evening if needed.   liraglutide (VICTOZA) 18 MG/3ML SOPN, Inject 1.8 mg into the skin every evening.  Current Outpatient Medications (Cardiovascular):    amLODipine  (NORVASC ) 10 MG tablet, Take 1 tablet (10 mg total) by mouth daily.   atorvastatin  (LIPITOR) 10 MG tablet, TAKE HALF TABLET BY MOUTH DAILY   ezetimibe   (ZETIA ) 10 MG tablet, Take 1 tablet (10 mg total) by mouth daily.   hydrochlorothiazide  (HYDRODIURIL ) 25 MG tablet, TAKE ONE TABLET BY MOUTH DAILY   metoprolol  tartrate (LOPRESSOR ) 50 MG tablet, Take 1 tablet (50 mg total) by mouth 2 (two) times daily.   nitroGLYCERIN  (NITROSTAT ) 0.4 MG SL tablet, Place 1 tablet (0.4 mg total) under the tongue every 5 (five) minutes as needed for chest pain.  Current Outpatient Medications (Respiratory):    cetirizine  (ZYRTEC ) 10 MG tablet, Take 2 tablets (20 mg total) by mouth 2 (two) times daily. Take 1-2 tablets 1-2 times a day for itch   montelukast  (SINGULAIR ) 10 MG tablet, Take 1 tablet (10 mg total) by mouth at bedtime.   sodium chloride  (OCEAN) 0.65 % SOLN nasal spray, PLACE 1 SPRAY INTO BOTH NOSTRILS AS NEEDED FOR CONGESTION.   SYMBICORT  160-4.5 MCG/ACT inhaler, Inhale 2 puffs into the lungs 2 (two) times daily. INHALE TWO PUFFS INTO THE LUNGS IN THE MORNING AND AT BEDTIME   triamcinolone  (NASACORT ) 55 MCG/ACT AERO nasal inhaler, Place 1 spray into the nose 2 (two) times daily. 1 spray each nostril 2 times per day   VENTOLIN  HFA 108 (90 Base) MCG/ACT inhaler, INHALE TWO PUFFS INTO THE LUNGS EVERY 4 (FOUR) HOURS AS NEEDED FOR WHEEZING OR SHORTNESS OF BREATH  Current Outpatient Medications (  Analgesics):    diclofenac  (VOLTAREN ) 50 MG EC tablet, TAKE 1 TABLET (50 MG TOTAL) BY MOUTH TWO (TWO) TIMES DAILY.   SUMAtriptan  (IMITREX ) 25 MG tablet, Take 1 tablet (25 mg total) by mouth every 2 (two) hours as needed for migraine. May repeat in 2 hours if headache persists or recurs.   Current Outpatient Medications (Other):    ACCU-CHEK GUIDE test strip, use to check blood sugar In Vitro Once a day DX: E11.9 for 90 days   cabotegravir  & rilpivirine  ER (CABENUVA ) 600 & 900 MG/3ML injection, Inject 1 kit into the muscle every 8 (eight) weeks.   chlorhexidine  (PERIDEX ) 0.12 % solution, USE AS DIRECTED 15 MLS IN THE MOUTH OR THROAT TWICE A DAY   cholecalciferol   (VITAMIN D3) 25 MCG (1000 UNIT) tablet, Take 3 tablets (3,000 Units total) by mouth daily.   ciprofloxacin -dexamethasone  (CIPRODEX ) OTIC suspension, Place 4 drops into the left ear as needed (ear infection).   diphenoxylate -atropine  (LOMOTIL ) 2.5-0.025 MG tablet, Take by mouth 4 (four) times daily as needed for diarrhea or loose stools.   famotidine  (PEPCID ) 40 MG tablet, Take 1 tablet (40 mg total) by mouth at bedtime.   GLOBAL EASE INJECT PEN NEEDLES 32G X 4 MM MISC, USE AS DIRECTED WITH VICTOZA for 30   NEURONTIN  100 MG capsule, TAKE ONE TO TWO CAPSULES BY MOUTH THREE TO FOUR TIMES DAILY   nortriptyline  (PAMELOR ) 10 MG capsule, Take 2 capsules (20 mg total) by mouth at bedtime.   ondansetron  (ZOFRAN ) 4 MG tablet, Take 1 tablet (4 mg total) by mouth every 8 (eight) hours as needed for nausea or vomiting.   pantoprazole  (PROTONIX ) 40 MG tablet, Take 1 tablet (40 mg total) by mouth 2 (two) times daily.   Potassium Chloride  ER 20 MEQ TBCR, SMARTSIG:1.0 Tablet(s) By Mouth Daily   potassium chloride  SA (KLOR-CON  M) 20 MEQ tablet, Take 2 tablets (40 mEq total) by mouth daily.   RESTASIS 0.05 % ophthalmic emulsion, Place 1 drop into both eyes 2 (two) times daily.   sertraline  (ZOLOFT ) 100 MG tablet, Take 1 tablet (100 mg total) by mouth daily.  Review of Systems:  Review of system negative unless stated in the problem list or HPI.    Physical Exam:  There were no vitals filed for this visit. Physical Exam General: NAD HENT: NCAT Lungs: CTAB, no wheeze, rhonchi or rales.  Cardiovascular: Normal heart sounds, no r/m/g, 2+ pulses in all extremities. No LE edema Abdomen: No TTP, normal bowel sounds MSK: No asymmetry or muscle atrophy.  Skin: no lesions noted on exposed skin Neuro: Alert and oriented x4. CN grossly intact Psych: Normal mood and normal affect   Assessment & Plan:   No problem-specific Assessment & Plan notes found for this encounter.   See Encounters Tab for problem based  charting.  Patient Discussed with Dr. {[id number]::Hlpoonli,Ynqqfjw,Floozw,Wjmzwimj,Cpwrzwu,Fjryzw,Ojl,Yjuryzm,Tpoopjfd} [name] Bathe, MD [name] [name]. [facility] Internal Medicine Residency, PGY-3   HTN Amlodipine  10, hydrochlorothiazide  25 mg every day, metop 50 mg BID.  DMII On victoza 1.8 mg daily. Repeat A1c today.   Liver Fibrosis Repeat liver ultrasound: fib 4 score 0.9 using previous labs.   Adrenal insufficiency: seen by endo 01/2023. Needs to take 5 mg hydrocortisone  qam.

## 2023-05-03 ENCOUNTER — Ambulatory Visit: Payer: Medicaid Other | Admitting: Infectious Diseases

## 2023-05-03 ENCOUNTER — Other Ambulatory Visit: Payer: Self-pay

## 2023-05-03 DIAGNOSIS — Z792 Long term (current) use of antibiotics: Secondary | ICD-10-CM

## 2023-05-03 NOTE — Progress Notes (Signed)
 Patient in for form completion / correction.  5 minutes time spent completing  Corean Fireman, MSN, NP-C [facility]  [email or web address] Pager: [phone] Office: [phone] [facility] Main Line: [phone] *Secure Chat Communication Welcome

## 2023-05-05 ENCOUNTER — Ambulatory Visit: Payer: Medicaid Other | Admitting: Surgical

## 2023-05-08 ENCOUNTER — Other Ambulatory Visit: Payer: Self-pay | Admitting: Allergy and Immunology

## 2023-05-16 ENCOUNTER — Ambulatory Visit: Payer: Medicaid Other | Admitting: Student

## 2023-05-16 ENCOUNTER — Other Ambulatory Visit: Payer: Self-pay

## 2023-05-16 ENCOUNTER — Encounter: Payer: Self-pay | Admitting: Student

## 2023-05-16 VITALS — BP 125/72 | HR 89 | Temp 98.3°F | Ht 61.0 in | Wt 175.4 lb

## 2023-05-16 DIAGNOSIS — Z794 Long term (current) use of insulin: Secondary | ICD-10-CM | POA: Diagnosis not present

## 2023-05-16 DIAGNOSIS — E119 Type 2 diabetes mellitus without complications: Secondary | ICD-10-CM | POA: Diagnosis present

## 2023-05-16 LAB — POCT GLYCOSYLATED HEMOGLOBIN (HGB A1C): Hemoglobin A1C: 6.3 % — AB (ref 4.0–5.6)

## 2023-05-16 LAB — GLUCOSE, CAPILLARY: Glucose-Capillary: 178 mg/dL — ABNORMAL HIGH (ref 70–99)

## 2023-05-16 NOTE — Patient Instructions (Signed)
Thank you, Ms.[name] for allowing [place] to provide your care today.  I have ordered the following tests for you:   Lab Orders         Glucose, capillary         POC Hbg A1C      Referrals ordered today:   Referral Orders  No referral(s) requested today     I have ordered the following medication/changed the following medications:   Stop the following medications: There are no discontinued medications.   Start the following medications: No orders of the defined types were placed in this encounter.    Follow up: 3 months for A1c    Please remember:   Recommendations for weight loss and blood glucose control.   1) Cut out liquid sources of calories such as soda, juices, sports drinks, alcohol, sweet teas, and coffee with added sugars. These are "empty calories" meaning they bring your blood sugar up and add weight to your body without making you feel full.   2) Avoid food with added sugars. Some examples would be cakes, candies, and sauces (ie. ketchup and BBQ). You may be surprised how much sugar they sneak into processed foods.   3) Replace foods high in starches such as pasta, rice, potatoes, or breads with foods higher in protein such as meat.  Protein rich foods are more satiating - meaning they make you feel more full than carbohydrates. They also provide the body with important nutrients that we need. You can read nutritional labels to get more information regarding how much protein, carbohydrates, and fats foods contain.     We look forward to seeing you next time. Please call our clinic at [phone] if you have any questions or concerns. The best time to call is Monday-Friday from 9am-4pm, but there is someone available 24/7. If after hours or the weekend, call the main hospital number and ask for the Internal Medicine Resident On-Call. If you need medication refills, please notify your pharmacy one week in advance and they will send [place] a request.   Thank you  for trusting me with your care. Wishing you the best!  Lovie Macadamia MD [facility]

## 2023-05-16 NOTE — Progress Notes (Addendum)
Subjective:  CC: A1c, diabetes follow-up  HPI:  [name] is a 63 y.o. person with a past medical history stated below and presents today for the stated chief complaint.  Since her last visit she has seen many of her specialist including endocrinology and cardiology.  She had cardiology workup including echo and calcium score which did not reveal any obvious source for her chronic chest pain.  She states that she stopped carvedilol and resume metoprolol as she felt that it gave her side effects.  Her endocrinologist decreased her hydrocortisone dosing, and her blood pressure is well-controlled.  She will continue to see her specialist in the coming weeks and months.   Past Medical History:  Diagnosis Date   Allergic rhinitis 05/09/2006   Allergy    Anxiety    Arthritis    Asthma    CAD (coronary artery disease) 03/19/2023   CCTA 03/16/23: CAC score 471 (98th percentile), RCA diffuse 25-49, dLM minimal Ca2+ plaque, LAD prox minimal plaques, pLCx < 24; TPV 542 mm3 (86th percentile)    CHF (congestive heart failure) (HCC)    Chronic back pain    Chronic night sweats 08/23/2022   Diabetes mellitus without complication (HCC) 04/25/2014   GERD (gastroesophageal reflux disease)    Heart murmur    as a child   Hepatitis C    genotype 1b, 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 s/p 12 week course of simeprevir and sofosbuvir between October 2014 and January 2015 with resolution.   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 90 tablet 3   atorvastatin (LIPITOR) 10 MG tablet TAKE HALF TABLET BY MOUTH DAILY 90 tablet 10   cabotegravir & rilpivirine ER (CABENUVA) 600 & 900 MG/3ML injection Inject 1 kit into the muscle every 8 (eight) weeks. 6 mL 6   cetirizine (ZYRTEC) 10 MG tablet Take 2 tablets (20 mg total) by mouth 2 (two) times daily. Take 1-2 tablets 1-2 times a day for itch 360 tablet 1   chlorhexidine (PERIDEX) 0.12 % solution USE AS DIRECTED 15 MLS IN THE MOUTH OR THROAT TWICE A DAY 473 mL 1   cholecalciferol (VITAMIN D3) 25 MCG (1000 UNIT) tablet Take 3 tablets (3,000 Units total) by mouth daily. 90 tablet 11   ciprofloxacin-dexamethasone (CIPRODEX) OTIC suspension Place 4 drops into the left ear as needed (ear infection).     diclofenac (VOLTAREN) 50 MG EC tablet TAKE 1 TABLET (50 MG TOTAL) BY MOUTH TWO (TWO) TIMES DAILY. 60 tablet 0   diphenoxylate-atropine (LOMOTIL) 2.5-0.025 MG tablet Take by mouth 4 (four) times daily as needed for diarrhea or loose stools.     ezetimibe (ZETIA)  10 MG tablet Take 1 tablet (10 mg total) by mouth daily. 90 tablet 3   famotidine (PEPCID) 40 MG tablet Take 1 tablet (40 mg total) by mouth at bedtime. 90 tablet 1   GLOBAL EASE INJECT PEN NEEDLES 32G X 4 MM MISC USE AS DIRECTED WITH VICTOZA for 30     hydrochlorothiazide (HYDRODIURIL) 25 MG tablet TAKE ONE TABLET BY MOUTH DAILY 90 tablet 2   hydrocortisone (CORTEF) 5 MG tablet Take 2.5-5 mg by mouth See admin instructions. Take 1 tablet (5 mg) by mouth in the morning (scheduled) & may taken an additional 0.5 tablet (2.5 mg) by mouth in the evening if needed.     liraglutide (VICTOZA) 18 MG/3ML SOPN Inject 1.8 mg into the skin every evening.     metoprolol tartrate (LOPRESSOR) 50 MG tablet Take 1 tablet  (50 mg total) by mouth 2 (two) times daily. 180 tablet 3   montelukast (SINGULAIR) 10 MG tablet Take 1 tablet (10 mg total) by mouth at bedtime. 90 tablet 1   NEURONTIN 100 MG capsule TAKE ONE TO TWO CAPSULES BY MOUTH THREE TO FOUR TIMES DAILY 180 capsule 0   nitroGLYCERIN (NITROSTAT) 0.4 MG SL tablet Place 1 tablet (0.4 mg total) under the tongue every 5 (five) minutes as needed for chest pain. 25 tablet 1   nortriptyline (PAMELOR) 10 MG capsule Take 2 capsules (20 mg total) by mouth at bedtime. 180 capsule 3   ondansetron (ZOFRAN) 4 MG tablet Take 1 tablet (4 mg total) by mouth every 8 (eight) hours as needed for nausea or vomiting. 20 tablet 0   pantoprazole (PROTONIX) 40 MG tablet Take 1 tablet (40 mg total) by mouth 2 (two) times daily. 180 tablet 1   potassium chloride SA (KLOR-CON M) 20 MEQ tablet Take 2 tablets (40 mEq total) by mouth daily. 180 tablet 3   RESTASIS 0.05 % ophthalmic emulsion Place 1 drop into both eyes 2 (two) times daily.     sertraline (ZOLOFT) 100 MG tablet Take 1 tablet (100 mg total) by mouth daily. 90 tablet 3   sodium chloride (OCEAN) 0.65 % SOLN nasal spray PLACE 1 SPRAY INTO BOTH NOSTRILS AS NEEDED FOR CONGESTION. 3 Bottle 0   SUMAtriptan (IMITREX) 25 MG tablet Take 1 tablet (25 mg total) by mouth every 2 (two) hours as needed for migraine. May repeat in 2 hours if headache persists or recurs. 12 tablet 6   SYMBICORT 160-4.5 MCG/ACT inhaler Inhale 2 puffs into the lungs 2 (two) times daily. INHALE TWO PUFFS INTO THE LUNGS IN THE MORNING AND AT BEDTIME 30.6 g 1   triamcinolone (NASACORT) 55 MCG/ACT AERO nasal inhaler Place 1 spray into the nose 2 (two) times daily. 1 spray each nostril 2 times per day 50.7 g 1   VENTOLIN HFA 108 (90 Base) MCG/ACT inhaler INHALE TWO PUFFS INTO THE LUNGS EVERY 4 (FOUR) HOURS AS NEEDED FOR WHEEZING OR SHORTNESS OF BREATH 18 g 0   No current facility-administered medications on file prior to visit.    Review of Systems: Please see  assessment and plan for pertinent positives and negatives.  Objective:   Vitals:   05/16/23 0954  BP: 125/72  Pulse: 89  Temp: 98.3 F (36.8 C)  TempSrc: Oral  SpO2: 96%  Weight: 175 lb 6.4 oz (79.6 kg)  Height: 5\' 1"  (1.549 m)    Physical Exam: Constitutional: Well-appearing Cardiovascular: Regular rate and rhythm Pulmonary/Chest: lungs clear to auscultation bilaterally Abdominal: soft, non-tender, non-distended Extremities: No  edema of the lower extremities bilaterally Psych: Pleasant affect Thought process is linear.     Assessment & Plan:  Type 2 diabetes mellitus without complication, without long-term current use of insulin (HCC) Patient is doing well overall.  She was able to see her endocrinologist, and her steroid dosing in the setting of Addison's disease was decreased.  Her A1c has been very stable in the past.  A1c today obtained and decreased from 6.5% to 6.3%.  Her weight is up from around 168 pounds to 175 pounds since last visit.  She says that she has a sweet tooth for candies as well as carbs like pasta.  Plan: Well-controlled type 2 diabetes, follow-up in 3 to 6 months for repeat visit. Up-to-date on diabetes screening exams.    Patient discussed with Dr. Alver Sorrow MD [facility] Internal [facility]  PGY-1 Pager: [phone]  Phone: [phone] Date 05/16/2023  Time 10:35 AM

## 2023-05-16 NOTE — Assessment & Plan Note (Signed)
Patient is doing well overall.  She was able to see her endocrinologist, and her steroid dosing in the setting of Addison's disease was decreased.  Her A1c has been very stable in the past.  A1c today obtained and decreased from 6.5% to 6.3%.  Her weight is up from around 168 pounds to 175 pounds since last visit.  She says that she has a sweet tooth for candies as well as carbs like pasta.  Plan: Well-controlled type 2 diabetes, follow-up in 3 to 6 months for repeat visit. Up-to-date on diabetes screening exams.

## 2023-05-17 DIAGNOSIS — H7293 Unspecified perforation of tympanic membrane, bilateral: Secondary | ICD-10-CM | POA: Diagnosis not present

## 2023-05-17 DIAGNOSIS — H9212 Otorrhea, left ear: Secondary | ICD-10-CM | POA: Diagnosis not present

## 2023-05-17 DIAGNOSIS — H938X3 Other specified disorders of ear, bilateral: Secondary | ICD-10-CM | POA: Diagnosis not present

## 2023-05-17 NOTE — Progress Notes (Signed)
 Internal Medicine Clinic Attending  Case discussed with the resident at the time of the visit.  We reviewed the resident's history and exam and pertinent patient test results.  I agree with the assessment, diagnosis, and plan of care documented in the resident's note.

## 2023-05-19 ENCOUNTER — Encounter: Payer: Self-pay | Admitting: Surgical

## 2023-05-19 ENCOUNTER — Other Ambulatory Visit: Payer: Self-pay

## 2023-05-19 ENCOUNTER — Ambulatory Visit: Payer: Medicaid Other | Admitting: Surgical

## 2023-05-19 DIAGNOSIS — M19012 Primary osteoarthritis, left shoulder: Secondary | ICD-10-CM

## 2023-05-19 MED ORDER — BUPIVACAINE HCL 0.25 % IJ SOLN
9.0000 mL | INTRAMUSCULAR | Status: AC | PRN
  Administered 2023-05-19: 9 mL via INTRA_ARTICULAR

## 2023-05-19 MED ORDER — METHYLPREDNISOLONE ACETATE 40 MG/ML IJ SUSP
40.0000 mg | INTRAMUSCULAR | Status: AC | PRN
  Administered 2023-05-19: 40 mg via INTRA_ARTICULAR

## 2023-05-19 MED ORDER — LIDOCAINE HCL 1 % IJ SOLN
5.0000 mL | INTRAMUSCULAR | Status: AC | PRN
  Administered 2023-05-19: 5 mL

## 2023-05-19 NOTE — Progress Notes (Signed)
   Procedure Note  Patient: [name] [name]             Date of Birth: 1960-09-22           MRN: [id number]             Visit Date: 05/19/2023  Procedures: Visit Diagnoses:  1. Arthritis of left shoulder region     Large Joint Inj: L glenohumeral on 05/19/2023 4:42 PM Details: 22 G 3.5 in needle, ultrasound-guided posterior approach Medications: 5 mL lidocaine 1 %; 9 mL bupivacaine 0.25 %; 40 mg methylPREDNISolone acetate 40 MG/ML Outcome: tolerated well, no immediate complications Procedure, treatment alternatives, risks and benefits explained, specific risks discussed. Consent was given by the patient. Patient was prepped and draped in the usual sterile fashion.

## 2023-05-26 ENCOUNTER — Other Ambulatory Visit: Payer: Self-pay | Admitting: Surgical

## 2023-06-05 ENCOUNTER — Other Ambulatory Visit: Payer: Self-pay | Admitting: Allergy and Immunology

## 2023-06-08 ENCOUNTER — Other Ambulatory Visit: Payer: Self-pay | Admitting: Obstetrics and Gynecology

## 2023-06-08 NOTE — Patient Instructions (Signed)
Visit Information  [name] [name] was given information about Medicaid Managed Care team care coordination services as a part of their [facility] Medicaid benefit. [name] [name] verbally consented to engagement with the [facility] Managed Care team.   If you are experiencing a medical emergency, please call 911 or report to your local emergency department or urgent care.   If you have a non-emergency medical problem during routine business hours, please contact your provider's office and ask to speak with a nurse.   For questions related to your [facility], please call: [phone] or visit the homepage here: [email or web address]  If you would like to schedule transportation through your [facility], please call the following number at least 2 days in advance of your appointment: [phone]   Rides for urgent appointments can also be made after hours by calling Member Services.  Call the Behavioral Health Crisis Line at [phone], at any time, 24 hours a day, 7 days a week. If you are in danger or need immediate medical attention call 911.  If you would like help to quit smoking, call 1-800-QUIT-NOW (713-169-2163) OR Espaol: 1-855-Djelo-Ya (1-324-401-0272) o para ms informacin haga clic aqu or Text READY to 536-644 to register via text  [name] [name] - following are the goals we discussed in your visit today:   Goals Addressed             This Visit's Progress    Protect My Health       Timeframe:  Long-Range Goal Priority:  Medium Start Date:      05/07/20                       Expected End Date: ongoing             Follow Up Date: 07/06/23  - schedule appointment for vaccines needed due to my age or health - schedule recommended health tests (blood work, mammogram, colonoscopy, pap test) - schedule and keep appointment for annual check-up    Why is  this important?   Screening tests can find diseases early when they are easier to treat.  Your doctor or nurse will talk with you about which tests are important for you.  Getting shots for common diseases like the flu and shingles will help prevent them.   06/08/23:  Upcoming ENT and pain management and ID appt.   Patient verbalizes understanding of instructions and care plan provided today and agrees to view in MyChart. Active MyChart status and patient understanding of how to access instructions and care plan via MyChart confirmed with patient.     The Managed Medicaid care management team will reach out to the patient again over the next 30 business  days.  The  Patient   has been provided with contact information for the Managed Medicaid care management team and has been advised to call with any health related questions or concerns.   [name] RN, BSN, [employer] [facility] RN Care Manager Direct Dial [phone]/LOV [phone] Website: [email or web address]   Following is a copy of your plan of care:  Care Plan : General Plan of Care (Adult)  Updates made by [name], RN since 06/08/2023 12:00 AM     Problem: Health Promotion or Disease Self-Management (General Plan of Care)   Priority: Medium  Onset Date: 08/10/2020     Long-Range Goal: Self-Management Plan Developed   Start Date: 05/07/2020  Expected End Date: 07/23/2023  Recent Progress: On track  Priority: Medium  Note:   Current Barriers:  Chronic Disease Management support and education needs related to DM, HTN, asthma, GERD, anxiety, osteoarthritis, HLD, HIV 06/08/23: Ongoing left shoulder and neck pain-followed by pain management and ORTHO-to check on more PT.  Ongoing ear infection-has been on Abx-has appt with ENT next week.  Occasional low blood sugar-will check with ENDO.  BP and BG stable.    Nurse Case Manager Clinical Goal(s):  Over the next 30 days, patient will attend all  scheduled medical appointments  Interventions:  Inter-disciplinary care team collaboration (see longitudinal plan of care) Evaluation of current treatment plan and patient's adherence to plan as established by provider. Reviewed medications with patient. Collaborated with pharmacy regarding medications. Discussed plans with patient for ongoing care management follow up and provided patient with direct contact information for care management team Reviewed scheduled/upcoming provider appointments. Pharmacy referral for medication review Patient provided with [facility] transportation information.  Asthma: (Status:New goal.) Long Term Goal Provided education about and advised patient to utilize infection prevention strategies to reduce risk of respiratory infection Discussed the importance of adequate rest and management of fatigue with Asthma Assessed social determinant of health barriers   Diabetes Interventions:  (Status:  New goal.) Long Term Goal Assessed patient's understanding of A1c goal: <7% Reviewed medications with patient and discussed importance of medication adherence Counseled on importance of regular laboratory monitoring as prescribed Discussed plans with patient for ongoing care management follow up and provided patient with direct contact information for care management team Reviewed scheduled/upcoming provider appointments Review of patient status, including review of consultants reports, relevant laboratory and other test results, and medications completed Assessed social determinant of health barriers Lab Results  Component Value Date   HGBA1C 6.5 01/30/23       HGBA1C                                                      6.3                                    05/16/23      HGBA1C                                                      6.5                                    09/13/22  Hyperlipidemia Interventions:  (Status:  New goal.) Long Term Goal Medication review  performed; medication list updated in electronic medical record.  Provider established cholesterol goals reviewed Counseled on importance of regular laboratory monitoring as prescribed Assessed social determinant of health barriers   Hypertension Interventions:  (Status:  New goal.) Long Term Goal Last practice recorded BP readings:  BP Readings from Last 3 Encounters:           05/16/23        125/72 03/06/23         138/84 04/18/23  133/85  Most recent eGFR/CrCl:  Lab Results  Component Value Date   EGFR 73 02/08/2022    No components found for: "CRCL"  Evaluation of current treatment plan related to hypertension self management and patient's adherence to plan as established by provider Reviewed medications with patient and discussed importance of compliance Discussed plans with patient for ongoing care management follow up and provided patient with direct contact information for care management team Advised patient, providing education and rationale, to monitor blood pressure daily and record, calling PCP for findings outside established parameters Reviewed scheduled/upcoming provider appointments  Assessed social determinant of health barriers   Patient Goals/Self-Care Activities Over the next 60 days, patient will:  -Attends all scheduled provider appointments Calls pharmacy for medication refills Calls provider office for new concerns or questions 04/24/23-patient will schedule an appt with Dr. August Saucer.  Follow Up Plan: The Managed Medicaid care management team will reach out to the patient again over the next 45 business  days.  The patient has been provided with contact information for the Managed Medicaid care management team and has been advised to call with any health related questions or concerns.

## 2023-06-08 NOTE — Patient Outreach (Signed)
Medicaid Managed Care   Nurse Care Manager Note  06/08/2023 Name:  [name] [name] MRN:  [id number] DOB:  Sep 07, 1960  [name] [name] is an 63 y.o. year old female who is a primary patient of [name] Crocker, MD.  The Medicaid Managed Care Coordination team was consulted for assistance with:    Chronic healthcare management needs, DM, HTN, osteopenia, HIV, Addisons', HLD, rhinitis, asthma, Hepatitis C, CKD, LPRD, nuropathy, pituatary adenoma.  [name] was given information about Medicaid Managed Care Coordination team services today. [name] [name] Patient agreed to services and verbal consent obtained.  Engaged with patient by telephone for follow up visit in response to provider referral for case management and/or care coordination services.   Patient is participating in a Managed Medicaid Plan:  Yes  Assessments/Interventions:  Review of past medical history, allergies, medications, health status, including review of consultants reports, laboratory and other test data, was performed as part of comprehensive evaluation and provision of chronic care management services.  SDOH (Social Drivers of Health) assessments and interventions performed: SDOH Interventions    Flowsheet Row Patient Outreach Telephone from 06/08/2023 in [place] POPULATION HEALTH DEPARTMENT Patient Outreach Telephone from 04/24/2023 in [place] POPULATION HEALTH DEPARTMENT Patient Outreach Telephone from 03/24/2023 in [place] POPULATION HEALTH DEPARTMENT Patient Outreach Telephone from 02/01/2023 in [place] POPULATION HEALTH DEPARTMENT Patient Outreach Telephone from 12/02/2022 in [place] POPULATION HEALTH DEPARTMENT Patient Outreach Telephone from 09/02/2022 in [place] POPULATION HEALTH DEPARTMENT  SDOH Interventions        Food Insecurity Interventions Intervention Not Indicated -- -- -- -- Intervention Not Indicated  Housing Interventions -- -- -- -- -- Intervention Not Indicated  Transportation  Interventions -- -- -- Intervention Not Indicated -- --  Utilities Interventions -- -- -- Intervention Not Indicated -- --  Alcohol Usage Interventions -- -- Intervention Not Indicated (Score <7) -- -- --  Financial Strain Interventions -- -- Intervention Not Indicated -- -- --  Physical Activity Interventions Intervention Not Indicated -- -- -- -- --  Stress Interventions -- Intervention Not Indicated -- -- -- --  Social Connections Interventions -- Intervention Not Indicated -- -- -- --  Health Literacy Interventions -- -- -- -- Intervention Not Indicated --     Care Plan Allergies  Allergen Reactions   Acetaminophen Other (See Comments)    Inflamed liver, hospitalized  Other Reaction(s): liver demage   Morphine Sulfate Hives and Shortness Of Breath   Triamterene Hives   Aspirin-Caffeine Other (See Comments)    liver damage; upset stomach  Other Reaction(s): liver demage   Dyazide [Hydrochlorothiazide W-Triamterene] Hives   Empagliflozin Diarrhea    (Jardiance) stomach ache   Metformin Hcl Er Other (See Comments) and Nausea Only    upset stomach   Topamax [Topiramate] Other (See Comments)    Vision disturbances.   Citalopram Itching, Rash and Other (See Comments)   Emtricitabine-Tenofovir Df Rash    Descovy   Lisinopril Other (See Comments)    Cough    Losartan Nausea Only and Rash    Pt had rash, worsening dizziness, and nausea after starting losartan, which improved after stopping losartan   Triamterene-Hctz Rash   Medications Reviewed Today     Reviewed by [name], RN (Registered Nurse) on 06/08/23 at 0912  Med List Status: <None>   Medication Order Taking? Sig Documenting Provider Last Dose Status Informant  ACCU-CHEK GUIDE test strip [id number] No use to check blood sugar In Vitro Once a day DX:  E11.9 for 90 days [provider] Taking Active   amLODipine (NORVASC) 10 MG tablet [id number] No Take 1 tablet (10 mg total) by mouth daily. [name] Crocker, MD Taking Active   atorvastatin (LIPITOR) 10 MG tablet [id number] No TAKE HALF TABLET BY MOUTH DAILY Katsadouros, Vasilios, MD Taking Active Self  cabotegravir & rilpivirine ER (CABENUVA) 600 & 900 MG/3ML injection [id number] No Inject 1 kit into the muscle every 8 (eight) weeks. Blanchard Kelch, NP Taking Active   cetirizine (ZYRTEC) 10 MG tablet [id number] No Take 2 tablets (20 mg total) by mouth 2 (two) times daily. Take 1-2 tablets 1-2 times a day for itch Kozlow, Alvira Philips, MD Taking Active   chlorhexidine (PERIDEX) 0.12 % solution [id number] No USE AS DIRECTED 15 MLS IN THE MOUTH OR THROAT TWICE A DAY Dixon, Gomez Cleverly, NP Taking Active   cholecalciferol (VITAMIN D3) 25 MCG (1000 UNIT) tablet [id number] No Take 3 tablets (3,000 Units total) by mouth daily. Modena Slater, DO Taking Active   ciprofloxacin-dexamethasone [facility]) OTIC suspension [id number] No Place 4 drops into the left ear as needed (ear infection). [provider] Taking Active   diclofenac (VOLTAREN) 50 MG EC tablet [id number]  TAKE 1 TABLET (50 MG TOTAL) BY MOUTH TWO (TWO) TIMES DAILY Magnant, Charles L, PA-C  Active   diphenoxylate-atropine (LOMOTIL) 2.5-0.025 MG tablet [id number] No Take by mouth 4 (four) times daily as needed for diarrhea or loose stools. [provider] Taking Active   ezetimibe (ZETIA) 10 MG tablet [id number] No Take 1 tablet (10 mg total) by mouth daily. Tereso Newcomer T, PA-C Taking Active   famotidine (PEPCID) 40 MG tablet [id number] No Take 1 tablet (40 mg total) by mouth at bedtime. Kozlow, Alvira Philips, MD Taking Active   GLOBAL EASE INJECT PEN NEEDLES 32G X 4 MM MISC [id number] No USE AS DIRECTED WITH VICTOZA for 30 [provider] Taking Active   hydrochlorothiazide (HYDRODIURIL) 25 MG tablet [id number] No TAKE ONE TABLET BY MOUTH DAILY Evlyn Kanner, MD Taking Active   hydrocortisone (CORTEF) 5 MG tablet [id number] No Take 2.5-5 mg by mouth See admin instructions. Take 1 tablet (5  mg) by mouth in the morning (scheduled) & may taken an additional 0.5 tablet (2.5 mg) by mouth in the evening if needed. [provider] Taking Active Self           Med Note Monico Hoar Feb 27, 2023  3:23 PM)    liraglutide (VICTOZA) 18 MG/3ML SOPN [id number] No Inject 1.8 mg into the skin every evening. [provider] Taking Active Self  metoprolol tartrate (LOPRESSOR) 50 MG tablet [id number] No Take 1 tablet (50 mg total) by mouth 2 (two) times daily. Tereso Newcomer T, PA-C Taking Expired 05/28/23 2359   montelukast (SINGULAIR) 10 MG tablet [id number] No Take 1 tablet (10 mg total) by mouth at bedtime. Jessica Priest, MD Taking Active   NEURONTIN 100 MG capsule [id number]  TAKE ONE TO TWO CAPSULES BY MOUTH THREE TO FOUR TIMES DAILY Kozlow, Alvira Philips, MD  Active   nitroGLYCERIN (NITROSTAT) 0.4 MG SL tablet [id number] No Place 1 tablet (0.4 mg total) under the tongue every 5 (five) minutes as needed for chest pain. Steffanie Rainwater, MD Taking Active   nortriptyline (PAMELOR) 10 MG capsule [id number] No Take 2 capsules (20 mg total) by mouth at bedtime. Levert Feinstein, MD Taking Active   ondansetron [facility]) 4 MG tablet [id number] No Take 1 tablet (4 mg total) by  mouth every 8 (eight) hours as needed for nausea or vomiting. Morrie Sheldon, MD Taking Active   pantoprazole (PROTONIX) 40 MG tablet [id number] No Take 1 tablet (40 mg total) by mouth 2 (two) times daily. Kozlow, Alvira Philips, MD Taking Active   potassium chloride SA (KLOR-CON M) 20 MEQ tablet [id number] No Take 2 tablets (40 mEq total) by mouth daily. Tereso Newcomer T, PA-C Taking Active   RESTASIS 0.05 % ophthalmic emulsion [id number] No Place 1 drop into both eyes 2 (two) times daily. [provider] Taking Active Self  sertraline (ZOLOFT) 100 MG tablet [id number] No Take 1 tablet (100 mg total) by mouth daily. Marrianne Mood, MD Taking Active   sodium chloride (OCEAN) 0.65 % SOLN nasal spray [id number] No PLACE 1 SPRAY  INTO BOTH NOSTRILS AS NEEDED FOR CONGESTION. Camelia Phenes, DO Taking Active Self  SUMAtriptan (IMITREX) 25 MG tablet [id number] No Take 1 tablet (25 mg total) by mouth every 2 (two) hours as needed for migraine. May repeat in 2 hours if headache persists or recurs. Levert Feinstein, MD Taking Active Self  SYMBICORT 160-4.5 MCG/ACT inhaler [id number] No Inhale 2 puffs into the lungs 2 (two) times daily. INHALE TWO PUFFS INTO THE LUNGS IN THE MORNING AND AT BEDTIME Kozlow, Alvira Philips, MD Taking Active   triamcinolone (NASACORT) 55 MCG/ACT AERO nasal inhaler [id number] No Place 1 spray into the nose 2 (two) times daily. 1 spray each nostril 2 times per day Jessica Priest, MD Taking Active   VENTOLIN HFA 108 (90 Base) MCG/ACT inhaler [id number]  INHALE TWO PUFFS INTO THE LUNGS EVERY 4 (FOUR) HOURS AS NEEDED FOR WHEEZING OR SHORTNESS OF BREATH Kozlow, Alvira Philips, MD  Active             Patient Active Problem List   Diagnosis Date Noted   CAD (coronary artery disease) 03/19/2023   Chest pain 01/30/2023   Type 2 diabetes mellitus without complication, without long-term current use of insulin (HCC) 01/30/2023   Osteopenia after menopause 03/15/2022   Anxiety 03/15/2022   Impingement syndrome of left shoulder 07/01/2021   Osteoarthritis of thumb, right 11/29/2018   Chronic migraine w/o aura w/o status migrainosus, not intractable 08/27/2018   Central perforation of tympanic membrane of left ear 12/09/2016   Eustachian tube dysfunction, left 03/09/2016   Spondylosis of lumbar region without myelopathy or radiculopathy 10/08/2015   Liver fibrosis (HCC) 12/04/2014   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   Vitamin D deficiency 06/29/2014   Secondary adrenal insufficiency (HCC) 06/29/2014   History of tympanostomy tube placement 02/07/2014   Hepatitis C virus infection cured after antiviral drug therapy 03/16/2012   Health care maintenance 12/15/2011    HTN (hypertension) 10/05/2010   Hyperlipidemia associated with type 2 diabetes mellitus (HCC) 10/23/2008   HIV disease (HCC) 05/09/2006   Allergic rhinitis 05/09/2006   GERD 05/09/2006   Conditions to be addressed/monitored per PCP order:  Chronic healthcare management needs, DM, HTN, osteopenia, HIV, Addisons', HLD, rhinitis, asthma, Hepatitis C, CKD, LPRD, nuropathy, pituatary adenoma.  Care Plan : General Plan of Care (Adult)  Updates made by [name], RN since 06/08/2023 12:00 AM     Problem: Health Promotion or Disease Self-Management (General Plan of Care)   Priority: Medium  Onset Date: 08/10/2020     Long-Range Goal: Self-Management Plan Developed   Start Date: 05/07/2020  Expected End Date: 07/23/2023  Recent Progress: On track  Priority: Medium  Note:  Current Barriers:  Chronic Disease Management support and education needs related to DM, HTN, asthma, GERD, anxiety, osteoarthritis, HLD, HIV 06/08/23: Ongoing left shoulder and neck pain-followed by pain management and ORTHO-to check on more PT.  Ongoing ear infection-has been on Abx-has appt with ENT next week.  Occasional low blood sugar-will check with ENDO.  BP and BG stable.    Nurse Case Manager Clinical Goal(s):  Over the next 30 days, patient will attend all scheduled medical appointments  Interventions:  Inter-disciplinary care team collaboration (see longitudinal plan of care) Evaluation of current treatment plan and patient's adherence to plan as established by provider. Reviewed medications with patient. Collaborated with pharmacy regarding medications. Discussed plans with patient for ongoing care management follow up and provided patient with direct contact information for care management team Reviewed scheduled/upcoming provider appointments. Pharmacy referral for medication review Patient provided with [facility] transportation information.  Asthma: (Status:New goal.) Long Term Goal Provided education  about and advised patient to utilize infection prevention strategies to reduce risk of respiratory infection Discussed the importance of adequate rest and management of fatigue with Asthma Assessed social determinant of health barriers   Diabetes Interventions:  (Status:  New goal.) Long Term Goal Assessed patient's understanding of A1c goal: <7% Reviewed medications with patient and discussed importance of medication adherence Counseled on importance of regular laboratory monitoring as prescribed Discussed plans with patient for ongoing care management follow up and provided patient with direct contact information for care management team Reviewed scheduled/upcoming provider appointments Review of patient status, including review of consultants reports, relevant laboratory and other test results, and medications completed Assessed social determinant of health barriers Lab Results  Component Value Date   HGBA1C 6.5 01/30/23       HGBA1C                                                      6.3                                    05/16/23      HGBA1C                                                      6.5                                    09/13/22  Hyperlipidemia Interventions:  (Status:  New goal.) Long Term Goal Medication review performed; medication list updated in electronic medical record.  Provider established cholesterol goals reviewed Counseled on importance of regular laboratory monitoring as prescribed Assessed social determinant of health barriers   Hypertension Interventions:  (Status:  New goal.) Long Term Goal Last practice recorded BP readings:  BP Readings from Last 3 Encounters:           05/16/23        125/72 03/06/23         138/84 04/18/23        133/85  Most recent eGFR/CrCl:  Lab Results  Component  Value Date   EGFR 73 02/08/2022    No components found for: "CRCL"  Evaluation of current treatment plan related to hypertension self management and patient's  adherence to plan as established by provider Reviewed medications with patient and discussed importance of compliance Discussed plans with patient for ongoing care management follow up and provided patient with direct contact information for care management team Advised patient, providing education and rationale, to monitor blood pressure daily and record, calling PCP for findings outside established parameters Reviewed scheduled/upcoming provider appointments  Assessed social determinant of health barriers   Patient Goals/Self-Care Activities Over the next 60 days, patient will:  -Attends all scheduled provider appointments Calls pharmacy for medication refills Calls provider office for new concerns or questions 04/24/23-patient will schedule an appt with Dr. August Saucer.  Follow Up Plan: The Managed Medicaid care management team will reach out to the patient again over the next 30 business  days.  The patient has been provided with contact information for the Managed Medicaid care management team and has been advised to call with any health related questions or concerns.    Follow Up:  Patient agrees to Care Plan and Follow-up.  Plan: The Managed Medicaid care management team will reach out to the patient again over the next 30 business  days. and The  Patient has been provided with contact information for the Managed Medicaid care management team and has been advised to call with any health related questions or concerns.  Date/time of next scheduled RN care management/care coordination outreach:  07/06/23 at 230

## 2023-06-09 ENCOUNTER — Other Ambulatory Visit (HOSPITAL_COMMUNITY): Payer: Self-pay

## 2023-06-09 ENCOUNTER — Other Ambulatory Visit: Payer: Self-pay

## 2023-06-09 NOTE — Progress Notes (Signed)
Specialty Pharmacy Refill Coordination Note  [name] [name] is a 63 y.o. female assessed today regarding refills of clinic administered specialty medication(s) Cabotegravir & Rilpivirine [facility])   Clinic requested Courier to Provider Office   Delivery date: 06/21/23   Verified address: [address] [place] [address]   Medication will be filled on 06/20/23.

## 2023-06-15 DIAGNOSIS — H7293 Unspecified perforation of tympanic membrane, bilateral: Secondary | ICD-10-CM | POA: Diagnosis not present

## 2023-06-15 DIAGNOSIS — H9212 Otorrhea, left ear: Secondary | ICD-10-CM | POA: Diagnosis not present

## 2023-06-15 DIAGNOSIS — H938X2 Other specified disorders of left ear: Secondary | ICD-10-CM | POA: Diagnosis not present

## 2023-06-20 ENCOUNTER — Other Ambulatory Visit: Payer: Self-pay

## 2023-06-21 ENCOUNTER — Telehealth: Payer: Self-pay

## 2023-06-21 NOTE — Telephone Encounter (Signed)
 [facility] Patient Advocate Encounter  Patient's medications CABENUVA have been couriered to [facility] from [facility] pharmacy and will be administered at the patients appointment on 06/27/23.  Kae Heller, CPhT Specialty Pharmacy Patient [facility] Phone: [phone] Fax:  [phone]

## 2023-06-26 NOTE — Progress Notes (Unsigned)
 HPI: [name] [name] is a 63 y.o. female who presents to the [facility] pharmacy clinic for [place] administration.  Patient Active Problem List   Diagnosis Date Noted   CAD (coronary artery disease) 03/19/2023   Chest pain 01/30/2023   Type 2 diabetes mellitus without complication, without long-term current use of insulin (HCC) 01/30/2023   Osteopenia after menopause 03/15/2022   Anxiety 03/15/2022   Impingement syndrome of left shoulder 07/01/2021   Osteoarthritis of thumb, right 11/29/2018   Chronic migraine w/o aura w/o status migrainosus, not intractable 08/27/2018   Central perforation of tympanic membrane of left ear 12/09/2016   Eustachian tube dysfunction, left 03/09/2016   Spondylosis of lumbar region without myelopathy or radiculopathy 10/08/2015   Liver fibrosis (HCC) 12/04/2014   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   Vitamin D deficiency 06/29/2014   Secondary adrenal insufficiency (HCC) 06/29/2014   History of tympanostomy tube placement 02/07/2014   Hepatitis C virus infection cured after antiviral drug therapy 03/16/2012   Health care maintenance 12/15/2011   HTN (hypertension) 10/05/2010   Hyperlipidemia associated with type 2 diabetes mellitus (HCC) 10/23/2008   HIV disease (HCC) 05/09/2006   Allergic rhinitis 05/09/2006   GERD 05/09/2006    Patient's Medications  New Prescriptions   No medications on file  Previous Medications   ACCU-CHEK GUIDE TEST STRIP    use to check blood sugar In Vitro Once a day DX: E11.9 for 90 days   AMLODIPINE (NORVASC) 10 MG TABLET    Take 1 tablet (10 mg total) by mouth daily.   ATORVASTATIN (LIPITOR) 10 MG TABLET    TAKE HALF TABLET BY MOUTH DAILY   CABOTEGRAVIR & RILPIVIRINE ER (CABENUVA) 600 & 900 MG/3ML INJECTION    Inject 1 kit into the muscle every 8 (eight) weeks.   CETIRIZINE (ZYRTEC) 10 MG TABLET    Take 2 tablets (20 mg total) by mouth 2 (two) times daily. Take 1-2  tablets 1-2 times a day for itch   CHLORHEXIDINE (PERIDEX) 0.12 % SOLUTION    USE AS DIRECTED 15 MLS IN THE MOUTH OR THROAT TWICE A DAY   CHOLECALCIFEROL (VITAMIN D3) 25 MCG (1000 UNIT) TABLET    Take 3 tablets (3,000 Units total) by mouth daily.   CIPROFLOXACIN-DEXAMETHASONE (CIPRODEX) OTIC SUSPENSION    Place 4 drops into the left ear as needed (ear infection).   DICLOFENAC (VOLTAREN) 50 MG EC TABLET    TAKE 1 TABLET (50 MG TOTAL) BY MOUTH TWO (TWO) TIMES DAILY   DIPHENOXYLATE-ATROPINE (LOMOTIL) 2.5-0.025 MG TABLET    Take by mouth 4 (four) times daily as needed for diarrhea or loose stools.   EZETIMIBE (ZETIA) 10 MG TABLET    Take 1 tablet (10 mg total) by mouth daily.   FAMOTIDINE (PEPCID) 40 MG TABLET    Take 1 tablet (40 mg total) by mouth at bedtime.   GLOBAL EASE INJECT PEN NEEDLES 32G X 4 MM MISC    USE AS DIRECTED WITH VICTOZA for 30   HYDROCHLOROTHIAZIDE (HYDRODIURIL) 25 MG TABLET    TAKE ONE TABLET BY MOUTH DAILY   HYDROCORTISONE (CORTEF) 5 MG TABLET    Take 2.5-5 mg by mouth See admin instructions. Take 1 tablet (5 mg) by mouth in the morning (scheduled) & may taken an additional 0.5 tablet (2.5 mg) by mouth in the evening if needed.   LIRAGLUTIDE (VICTOZA) 18 MG/3ML SOPN    Inject 1.8 mg into the skin every evening.  METOPROLOL TARTRATE (LOPRESSOR) 50 MG TABLET    Take 1 tablet (50 mg total) by mouth 2 (two) times daily.   MONTELUKAST (SINGULAIR) 10 MG TABLET    Take 1 tablet (10 mg total) by mouth at bedtime.   NEURONTIN 100 MG CAPSULE    TAKE ONE TO TWO CAPSULES BY MOUTH THREE TO FOUR TIMES DAILY   NITROGLYCERIN (NITROSTAT) 0.4 MG SL TABLET    Place 1 tablet (0.4 mg total) under the tongue every 5 (five) minutes as needed for chest pain.   NORTRIPTYLINE (PAMELOR) 10 MG CAPSULE    Take 2 capsules (20 mg total) by mouth at bedtime.   ONDANSETRON (ZOFRAN) 4 MG TABLET    Take 1 tablet (4 mg total) by mouth every 8 (eight) hours as needed for nausea or vomiting.   PANTOPRAZOLE  (PROTONIX) 40 MG TABLET    Take 1 tablet (40 mg total) by mouth 2 (two) times daily.   POTASSIUM CHLORIDE SA (KLOR-CON M) 20 MEQ TABLET    Take 2 tablets (40 mEq total) by mouth daily.   RESTASIS 0.05 % OPHTHALMIC EMULSION    Place 1 drop into both eyes 2 (two) times daily.   SERTRALINE (ZOLOFT) 100 MG TABLET    Take 1 tablet (100 mg total) by mouth daily.   SODIUM CHLORIDE (OCEAN) 0.65 % SOLN NASAL SPRAY    PLACE 1 SPRAY INTO BOTH NOSTRILS AS NEEDED FOR CONGESTION.   SUMATRIPTAN (IMITREX) 25 MG TABLET    Take 1 tablet (25 mg total) by mouth every 2 (two) hours as needed for migraine. May repeat in 2 hours if headache persists or recurs.   SYMBICORT 160-4.5 MCG/ACT INHALER    Inhale 2 puffs into the lungs 2 (two) times daily. INHALE TWO PUFFS INTO THE LUNGS IN THE MORNING AND AT BEDTIME   TRIAMCINOLONE (NASACORT) 55 MCG/ACT AERO NASAL INHALER    Place 1 spray into the nose 2 (two) times daily. 1 spray each nostril 2 times per day   VENTOLIN HFA 108 (90 BASE) MCG/ACT INHALER    INHALE TWO PUFFS INTO THE LUNGS EVERY 4 (FOUR) HOURS AS NEEDED FOR WHEEZING OR SHORTNESS OF BREATH  Modified Medications   No medications on file  Discontinued Medications   No medications on file    Allergies: Allergies  Allergen Reactions   Acetaminophen Other (See Comments)    Inflamed liver, hospitalized  Other Reaction(s): liver demage   Morphine Sulfate Hives and Shortness Of Breath   Triamterene Hives   Aspirin-Caffeine Other (See Comments)    liver damage; upset stomach  Other Reaction(s): liver demage   Dyazide [Hydrochlorothiazide W-Triamterene] Hives   Empagliflozin Diarrhea    (Jardiance) stomach ache   Metformin Hcl Er Other (See Comments) and Nausea Only    upset stomach   Topamax [Topiramate] Other (See Comments)    Vision disturbances.   Citalopram Itching, Rash and Other (See Comments)   Emtricitabine-Tenofovir Df Rash    Descovy   Lisinopril Other (See Comments)    Cough    Losartan  Nausea Only and Rash    Pt had rash, worsening dizziness, and nausea after starting losartan, which improved after stopping losartan   Triamterene-Hctz Rash    Labs: Lab Results  Component Value Date   HIV1RNAQUANT Not Detected 04/25/2023   HIV1RNAQUANT Not Detected 03/06/2023   HIV1RNAQUANT <20 (H) 12/28/2022   CD4TABS 516 03/06/2023   CD4TABS 487 02/08/2022   CD4TABS 608 02/02/2021    RPR and STI Lab  Results  Component Value Date   LABRPR NON-REACTIVE 02/02/2021   LABRPR NON-REACTIVE 11/13/2019   LABRPR NON-REACTIVE 08/30/2018   LABRPR NON-REACTIVE 11/16/2017   LABRPR NON REAC 07/12/2016    STI Results GC CT  10/20/2022 10:51 AM Negative  Negative   02/02/2021 10:13 AM Negative  Negative   08/30/2018 12:00 AM Negative  Negative   01/11/2018 12:00 AM Negative  Negative   11/16/2017 12:00 AM Negative  Negative   03/25/2011  4:35 PM  NEGATIVE     Hepatitis B Lab Results  Component Value Date   HEPBSAB NEG 12/15/2011   HEPBSAG NEGATIVE 12/15/2011   Hepatitis C Lab Results  Component Value Date   HCVRNAPCRQN <15 NOT DETECTED 02/21/2017   Hepatitis A No results found for: "HAV" Lipids: Lab Results  Component Value Date   CHOL 149 02/18/2022   TRIG 200 (H) 02/18/2022   HDL 40 02/18/2022   CHOLHDL 3.7 02/18/2022   VLDL 44 (H) 07/12/2016   LDLCALC 75 02/18/2022    TARGET DATE: The 6th  Assessment: [name] [name] presents today for her maintenance Cabenuva injections. Past injections were tolerated well without issues. Last HIV RNA was undetectable in December. Will defer checking today.    Administered cabotegravir 600mg /72mL in left upper outer quadrant of the gluteal muscle. Administered rilpivirine 900 mg/41mL in the right upper outer quadrant of the gluteal muscle. No issues with injections. She will follow up in 2 months for next set of injections.  Plan: - Cabenuva injections administered - Next injections scheduled for *** - Call with any issues or  questions  Tagan Bartram L. Ricco Dershem, PharmD, BCIDP, AAHIVP, CPP Clinical Pharmacist Practitioner Infectious Diseases Clinical Pharmacist [facility]

## 2023-06-27 ENCOUNTER — Ambulatory Visit: Payer: Medicaid Other | Admitting: Pharmacist

## 2023-06-27 ENCOUNTER — Other Ambulatory Visit: Payer: Self-pay

## 2023-06-27 DIAGNOSIS — B20 Human immunodeficiency virus [HIV] disease: Secondary | ICD-10-CM | POA: Diagnosis present

## 2023-06-27 MED ORDER — CABOTEGRAVIR & RILPIVIRINE ER 600 & 900 MG/3ML IM SUER
1.0000 | Freq: Once | INTRAMUSCULAR | Status: AC
  Administered 2023-06-27: 1 via INTRAMUSCULAR

## 2023-06-28 DIAGNOSIS — H9212 Otorrhea, left ear: Secondary | ICD-10-CM | POA: Diagnosis not present

## 2023-06-28 DIAGNOSIS — H7293 Unspecified perforation of tympanic membrane, bilateral: Secondary | ICD-10-CM | POA: Diagnosis not present

## 2023-06-28 NOTE — Progress Notes (Unsigned)
  PROCEDURE RECORD [facility]   Name: ARYONA SILL DOB:1960/07/02 MRN: [id number]  Date:06/28/2023  Physician: {CPRM-PROVIDERS:[id number]}    Nurse/CMA: ***  Allergies:  Allergies  Allergen Reactions   Acetaminophen Other (See Comments)    Inflamed liver, hospitalized  Other Reaction(s): liver demage   Morphine Sulfate Hives and Shortness Of Breath   Triamterene Hives   Aspirin-Caffeine Other (See Comments)    liver damage; upset stomach  Other Reaction(s): liver demage   Dyazide [Hydrochlorothiazide W-Triamterene] Hives   Empagliflozin Diarrhea    (Jardiance) stomach ache   Metformin Hcl Er Other (See Comments) and Nausea Only    upset stomach   Topamax [Topiramate] Other (See Comments)    Vision disturbances.   Citalopram Itching, Rash and Other (See Comments)   Emtricitabine-Tenofovir Df Rash    Descovy   Lisinopril Other (See Comments)    Cough    Losartan Nausea Only and Rash    Pt had rash, worsening dizziness, and nausea after starting losartan, which improved after stopping losartan   Triamterene-Hctz Rash    Consent Signed: {yes no:314532}  Is patient diabetic? {yes no:314532}  CBG today? ***  Pregnant: {yes no:314532} LMP: No LMP recorded. Patient is postmenopausal. (age 41-55)  Anticoagulants: {Yes/No:19989} Anti-inflammatory: {Yes/No:19989} Antibiotics: {Yes/No:19989}  Procedure: ***  Position: {PRONE/SUPINE/SITTING/LATERAL:21022916} Start Time: ***  End Time: ***  Fluoro Time: ***  RN/CMA *** ***    Time *** ***    BP *** ***    Pulse *** ***    Respirations *** ***    O2 Sat *** ***    S/S *** ***    Pain Level *** ***     D/C home with ***, [name] & O X 3, D/C instructions reviewed, and sits independently.

## 2023-06-29 ENCOUNTER — Encounter: Payer: Medicaid Other | Attending: Physical Medicine & Rehabilitation | Admitting: Physical Medicine & Rehabilitation

## 2023-06-29 ENCOUNTER — Encounter: Payer: Self-pay | Admitting: Physical Medicine & Rehabilitation

## 2023-06-29 VITALS — BP 125/78 | HR 78 | Ht 61.0 in | Wt 171.8 lb

## 2023-06-29 DIAGNOSIS — M533 Sacrococcygeal disorders, not elsewhere classified: Secondary | ICD-10-CM | POA: Insufficient documentation

## 2023-06-29 MED ORDER — LIDOCAINE HCL 1 % IJ SOLN
5.0000 mL | Freq: Once | INTRAMUSCULAR | Status: AC
  Administered 2023-06-29: 5 mL

## 2023-06-29 MED ORDER — LIDOCAINE HCL (PF) 2 % IJ SOLN
1.0000 mL | Freq: Once | INTRAMUSCULAR | Status: AC
  Administered 2023-06-29: 1 mL

## 2023-06-29 MED ORDER — BETAMETHASONE SOD PHOS & ACET 6 (3-3) MG/ML IJ SUSP
3.0000 mg | Freq: Once | INTRAMUSCULAR | Status: AC
  Administered 2023-06-29: 6 mg

## 2023-06-29 MED ORDER — IOHEXOL 180 MG/ML  SOLN
1.0000 mL | Freq: Once | INTRAMUSCULAR | Status: AC
  Administered 2023-06-29: 1 mL

## 2023-06-29 NOTE — Progress Notes (Signed)
 Bilateral sacroiliac injections under fluoroscopic guidance  Indication: Low back and buttocks pain not relieved by medication management and other conservative care.Has had failure of PT and OTC meds.  Hx HIV, Last CD4 Nov  2024 516 Last injection 03/31/23   Informed consent was obtained after describing risks and benefits of the procedure with the patient, this includes bleeding, bruising, infection, paralysis and medication side effects. The patient wishes to proceed and has given written consent. The patient was placed in a prone position. The lumbar and sacral area was marked and prepped with Betadine. A 25-gauge 1-1/2 inch needle was inserted into the skin and subcutaneous tissue and 1 mL of 1% lidocaine was injected into each side. Then a 25-gauge 3 inch spinal needle was inserted under fluoroscopic guidance into the left sacroiliac joint. AP and lateral images were utilized. Omnipaque 180x0.5 mL under live fluoroscopy demonstrated no intravascular uptake. Then a solution containing one ML of 6 mg per mL Celestone in 2 ML of 2% lidocaine MPF was injected x1.5 mL. This same procedure was repeated on the right side using the same needle, injectate, and technique. Patient tolerated the procedure well. Post procedure instructions were given. Please see post procedure form.

## 2023-07-03 ENCOUNTER — Other Ambulatory Visit: Payer: Self-pay | Admitting: Neurology

## 2023-07-03 ENCOUNTER — Other Ambulatory Visit: Payer: Self-pay | Admitting: Allergy and Immunology

## 2023-07-03 ENCOUNTER — Other Ambulatory Visit: Payer: Self-pay | Admitting: Student

## 2023-07-03 DIAGNOSIS — F419 Anxiety disorder, unspecified: Secondary | ICD-10-CM

## 2023-07-06 ENCOUNTER — Other Ambulatory Visit: Payer: Self-pay | Admitting: Obstetrics and Gynecology

## 2023-07-06 NOTE — Patient Instructions (Signed)
 Visit Information  [name]  - as a part of your Medicaid benefit, you are eligible for care management and care coordination services at no cost or copay. I was unable to reach you by phone today but would be happy to help you with your health related needs. Please feel free to call me at [phone].  A member of the Managed Medicaid care management team will reach out to you again over the next 30 business  days.   [name] RNC, BSN, [facility] RN Care Manager Direct Dial [phone] Website: [email or web address]

## 2023-07-06 NOTE — Patient Outreach (Signed)
  Medicaid Managed Care   Unsuccessful Attempt Note   07/06/2023 Name: [name] [name] MRN: [id number] DOB: August 20, 1960  Referred by: [name], MD Reason for referral : High Risk Managed Medicaid (Unsuccessful telephone outreach)  An unsuccessful telephone outreach was attempted today. The patient was referred to the case management team for assistance with care management and care coordination.    Follow Up Plan: The Managed Medicaid care management team will reach out to the patient again over the next 30 business  days. and The  Patient has been provided with contact information for the Managed Medicaid care management team and has been advised to call with any health related questions or concerns.   [name] RNC, BSN, [employer] [facility] RN Care Manager Direct Dial [phone] Website: [email or web address]

## 2023-07-07 ENCOUNTER — Other Ambulatory Visit: Payer: Self-pay | Admitting: Obstetrics and Gynecology

## 2023-07-07 NOTE — Patient Outreach (Signed)
 Medicaid Managed Care   Nurse Care Manager Note  07/07/2023 Name:  [name] [name] MRN:  [id number] DOB:  15-Mar-1961  [name] [name] is an 63 y.o. year old female who is a primary patient of [name], MD.  The [facility] Managed Care Coordination team was consulted for assistance with:    Chronic healthcare management needs, DM, HTN, osteopenia, HIV, Addison's Disease, HLD, rhinitis, asthma, Hepatitis C, CKD, LPRD, neuropathy  [name] [name] was given information about Medicaid Managed Care Coordination team services today. [name] [name] Parent agreed to services and verbal consent obtained.  Engaged with patient by telephone for follow up visit in response to provider referral for case management and/or care coordination services.   Patient is participating in a Managed Medicaid Plan:  Yes  Assessments/Interventions:  Review of past medical history, allergies, medications, health status, including review of consultants reports, laboratory and other test data, was performed as part of comprehensive evaluation and provision of chronic care management services.  SDOH (Social Drivers of Health) assessments and interventions performed: SDOH Interventions    Flowsheet Row Patient Outreach Telephone from 07/07/2023 in [place] POPULATION HEALTH DEPARTMENT Patient Outreach Telephone from 06/08/2023 in [place] POPULATION HEALTH DEPARTMENT Patient Outreach Telephone from 04/24/2023 in [place] POPULATION HEALTH DEPARTMENT Patient Outreach Telephone from 03/24/2023 in [place] POPULATION HEALTH DEPARTMENT Patient Outreach Telephone from 02/01/2023 in [place] POPULATION HEALTH DEPARTMENT Patient Outreach Telephone from 12/02/2022 in [place] POPULATION HEALTH DEPARTMENT  SDOH Interventions        Food Insecurity Interventions -- Intervention Not Indicated -- -- -- --  Housing Interventions Intervention Not Indicated -- -- -- -- --  Transportation Interventions -- -- -- --  Intervention Not Indicated --  Utilities Interventions -- -- -- -- Intervention Not Indicated --  Alcohol Usage Interventions -- -- -- Intervention Not Indicated (Score <7) -- --  Financial Strain Interventions -- -- -- Intervention Not Indicated -- --  Physical Activity Interventions -- Intervention Not Indicated -- -- -- --  Stress Interventions -- -- Intervention Not Indicated -- -- --  Social Connections Interventions -- -- Intervention Not Indicated -- -- --  Health Literacy Interventions -- -- -- -- -- Intervention Not Indicated     Care Plan Allergies  Allergen Reactions   Acetaminophen Other (See Comments)    Inflamed liver, hospitalized  Other Reaction(s): liver demage   Morphine Sulfate Hives and Shortness Of Breath   Triamterene Hives   Aspirin-Caffeine Other (See Comments)    liver damage; upset stomach  Other Reaction(s): liver demage   Dyazide [Hydrochlorothiazide W-Triamterene] Hives   Empagliflozin Diarrhea    (Jardiance) stomach ache   Metformin Hcl Er Other (See Comments) and Nausea Only    upset stomach   Topamax [Topiramate] Other (See Comments)    Vision disturbances.   Citalopram Itching, Rash and Other (See Comments)   Emtricitabine-Tenofovir Df Rash    Descovy   Lisinopril Other (See Comments)    Cough    Losartan Nausea Only and Rash    Pt had rash, worsening dizziness, and nausea after starting losartan, which improved after stopping losartan   Triamterene-Hctz Rash   Medications Reviewed Today     Reviewed by [name], RN (Registered Nurse) on 07/07/23 at 1330  Med List Status: <None>   Medication Order Taking? Sig Documenting Provider Last Dose Status Informant  ACCU-CHEK GUIDE test strip [id number]  use to check blood sugar In Vitro Once a day DX: E11.9 for  90 days [provider]  Active   amLODipine (NORVASC) 10 MG tablet [id number]  Take 1 tablet (10 mg total) by mouth daily. [name], MD  Active   atorvastatin  (LIPITOR) 10 MG tablet [id number]  TAKE HALF TABLET BY MOUTH DAILY Katsadouros, Vasilios, MD  Active Self  cabotegravir & rilpivirine ER (CABENUVA) 600 & 900 MG/3ML injection [id number]  Inject 1 kit into the muscle every 8 (eight) weeks. Blanchard Kelch, NP  Active   cetirizine (ZYRTEC) 10 MG tablet [id number]  Take 2 tablets (20 mg total) by mouth 2 (two) times daily. Take 1-2 tablets 1-2 times a day for itch Jessica Priest, MD  Active   chlorhexidine (PERIDEX) 0.12 % solution [id number]  USE AS DIRECTED 15 MLS IN THE MOUTH OR THROAT TWICE A DAY Dixon, Gomez Cleverly, NP  Active   cholecalciferol (VITAMIN D3) 25 MCG (1000 UNIT) tablet [id number]  Take 3 tablets (3,000 Units total) by mouth daily. Modena Slater, DO  Active   ciprofloxacin-dexamethasone [facility]) OTIC suspension [id number]  Place 4 drops into the left ear as needed (ear infection). [provider]  Active   diclofenac (VOLTAREN) 50 MG EC tablet [id number]  TAKE 1 TABLET (50 MG TOTAL) BY MOUTH TWO (TWO) TIMES DAILY Magnant, Charles L, PA-C  Active   diphenoxylate-atropine (LOMOTIL) 2.5-0.025 MG tablet [id number]  Take by mouth 4 (four) times daily as needed for diarrhea or loose stools. [provider]  Active   ezetimibe (ZETIA) 10 MG tablet [id number]  Take 1 tablet (10 mg total) by mouth daily. Tereso Newcomer T, PA-C  Expired 06/19/23 2359   famotidine (PEPCID) 40 MG tablet [id number]  Take 1 tablet (40 mg total) by mouth at bedtime. Kozlow, Alvira Philips, MD  Active   GLOBAL EASE INJECT PEN NEEDLES 32G X 4 MM MISC [id number]  USE AS DIRECTED WITH VICTOZA for 30 [provider]  Active   hydrochlorothiazide (HYDRODIURIL) 25 MG tablet [id number]  TAKE ONE TABLET BY MOUTH DAILY Evlyn Kanner, MD  Active   hydrocortisone (CORTEF) 5 MG tablet [id number]  Take 2.5-5 mg by mouth See admin instructions. Take 1 tablet (5 mg) by mouth in the morning (scheduled) & may taken an additional 0.5 tablet (2.5 mg) by mouth in the evening if  needed. [provider]  Active Self           Med Note Monico Hoar Feb 27, 2023  3:23 PM)    liraglutide (VICTOZA) 18 MG/3ML SOPN [id number]  Inject 1.8 mg into the skin every evening. [provider]  Active Self  metoprolol tartrate (LOPRESSOR) 50 MG tablet [id number]  Take 1 tablet (50 mg total) by mouth 2 (two) times daily. Tereso Newcomer T, PA-C  Expired 05/28/23 2359   montelukast (SINGULAIR) 10 MG tablet [id number]  Take 1 tablet (10 mg total) by mouth at bedtime. Jessica Priest, MD  Active   NEURONTIN 100 MG capsule [id number]  TAKE ONE TO TWO CAPSULES BY MOUTH THREE TO FOUR TIMES DAILY Kozlow, Alvira Philips, MD  Active   nitroGLYCERIN (NITROSTAT) 0.4 MG SL tablet [id number]  Place 1 tablet (0.4 mg total) under the tongue every 5 (five) minutes as needed for chest pain. Steffanie Rainwater, MD  Active   nortriptyline (PAMELOR) 10 MG capsule [id number]  TAKE TWO CAPSULES BY MOUTH AT BEDTIME Levert Feinstein, MD  Active   ondansetron (ZOFRAN) 4 MG tablet [id number]  Take 1 tablet (4 mg total) by mouth every 8 (  eight) hours as needed for nausea or vomiting. Morrie Sheldon, MD  Active   pantoprazole (PROTONIX) 40 MG tablet [id number]  Take 1 tablet (40 mg total) by mouth 2 (two) times daily. Kozlow, Alvira Philips, MD  Active   potassium chloride SA (KLOR-CON M) 20 MEQ tablet [id number]  Take 2 tablets (40 mEq total) by mouth daily. Tereso Newcomer T, PA-C  Active   RESTASIS 0.05 % ophthalmic emulsion [id number]  Place 1 drop into both eyes 2 (two) times daily. [provider]  Active Self  sertraline (ZOLOFT) 100 MG tablet [id number]  TAKE ONE TABLET BY MOUTH DAILY [name], MD  Active   sodium chloride (OCEAN) 0.65 % SOLN nasal spray [id number]  PLACE 1 SPRAY INTO BOTH NOSTRILS AS NEEDED FOR CONGESTION. Camelia Phenes, DO  Active Self  SUMAtriptan (IMITREX) 25 MG tablet [id number]  Take 1 tablet (25 mg total) by mouth every 2 (two) hours as needed for migraine. May  repeat in 2 hours if headache persists or recurs. Levert Feinstein, MD  Active Self  SYMBICORT 160-4.5 MCG/ACT inhaler [id number]  Inhale 2 puffs into the lungs 2 (two) times daily. INHALE TWO PUFFS INTO THE LUNGS IN THE MORNING AND AT BEDTIME Kozlow, Alvira Philips, MD  Active   triamcinolone (NASACORT) 55 MCG/ACT AERO nasal inhaler [id number]  Place 1 spray into the nose 2 (two) times daily. 1 spray each nostril 2 times per day Jessica Priest, MD  Active   VENTOLIN HFA 108 (90 Base) MCG/ACT inhaler [id number]  INHALE TWO PUFFS INTO THE LUNGS EVERY 4 (FOUR) HOURS AS NEEDED FOR WHEEZING OR SHORTNESS OF BREATH Kozlow, Alvira Philips, MD  Active            Patient Active Problem List   Diagnosis Date Noted   CAD (coronary artery disease) 03/19/2023   Chest pain 01/30/2023   Type 2 diabetes mellitus without complication, without long-term current use of insulin (HCC) 01/30/2023   Osteopenia after menopause 03/15/2022   Anxiety 03/15/2022   Impingement syndrome of left shoulder 07/01/2021   Osteoarthritis of thumb, right 11/29/2018   Chronic migraine w/o aura w/o status migrainosus, not intractable 08/27/2018   Central perforation of tympanic membrane of left ear 12/09/2016   Eustachian tube dysfunction, left 03/09/2016   Spondylosis of lumbar region without myelopathy or radiculopathy 10/08/2015   Liver fibrosis (HCC) 12/04/2014   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   Vitamin D deficiency 06/29/2014   Secondary adrenal insufficiency (HCC) 06/29/2014   History of tympanostomy tube placement 02/07/2014   Hepatitis C virus infection cured after antiviral drug therapy 03/16/2012   Health care maintenance 12/15/2011   HTN (hypertension) 10/05/2010   Hyperlipidemia associated with type 2 diabetes mellitus (HCC) 10/23/2008   HIV disease (HCC) 05/09/2006   Allergic rhinitis 05/09/2006   GERD 05/09/2006   Conditions to be addressed/monitored per PCP order:   Chronic healthcare management needs, DM, HTN, osteopenia, HIV, Addison's Disease, HLD, rhinitis, asthma, Hepatitis C, CKD, LPRD, neuropathy  Care Plan : General Plan of Care (Adult)  Updates made by [name], RN since 07/07/2023 12:00 AM     Problem: Health Promotion or Disease Self-Management (General Plan of Care)   Priority: Medium  Onset Date: 08/10/2020     Long-Range Goal: Self-Management Plan Developed   Start Date: 05/07/2020  Expected End Date: 10/07/2023  Recent Progress: On track  Priority: Medium  Note:   Current Barriers:  Chronic Disease Management support and  education needs related to DM, HTN, asthma, GERD, anxiety, osteoarthritis, HLD, HIV 07/07/23: Left shoulder and neck pain continue-recent injections-states she will f/u with ORTHO-? PT appt.  Blood glucose and BP stable.  Ear infection improved-has f/u 3/26.  Nurse Case Manager Clinical Goal(s):  Over the next 30 days, patient will attend all scheduled medical appointments  Interventions:  Inter-disciplinary care team collaboration (see longitudinal plan of care) Evaluation of current treatment plan and patient's adherence to plan as established by provider. Reviewed medications with patient. Collaborated with pharmacy regarding medications. Discussed plans with patient for ongoing care management follow up and provided patient with direct contact information for care management team Reviewed scheduled/upcoming provider appointments. Pharmacy referral for medication review Patient provided with [facility] transportation information.  Asthma: (Status:New goal.) Long Term Goal Provided education about and advised patient to utilize infection prevention strategies to reduce risk of respiratory infection Discussed the importance of adequate rest and management of fatigue with Asthma Assessed social determinant of health barriers   Diabetes Interventions:  (Status:  New goal.) Long Term Goal Assessed patient's  understanding of A1c goal: <7% Reviewed medications with patient and discussed importance of medication adherence Counseled on importance of regular laboratory monitoring as prescribed Discussed plans with patient for ongoing care management follow up and provided patient with direct contact information for care management team Reviewed scheduled/upcoming provider appointments Review of patient status, including review of consultants reports, relevant laboratory and other test results, and medications completed Assessed social determinant of health barriers Lab Results  Component Value Date   HGBA1C 6.5 01/30/23       HGBA1C                                                      6.3                                    05/16/23      HGBA1C                                                      6.5                                    09/13/22  Hyperlipidemia Interventions:  (Status:  New goal.) Long Term Goal Medication review performed; medication list updated in electronic medical record.  Provider established cholesterol goals reviewed Counseled on importance of regular laboratory monitoring as prescribed Assessed social determinant of health barriers   Hypertension Interventions:  (Status:  New goal.) Long Term Goal Last practice recorded BP readings:  BP Readings from Last 3 Encounters:           05/16/23        125/72 07/01/23        125/72 04/18/23        133/85  Most recent eGFR/CrCl:  Lab Results  Component Value Date   EGFR 73 02/08/2022    No components found for: "CRCL"  Evaluation of current treatment plan related to hypertension self  management and patient's adherence to plan as established by provider Reviewed medications with patient and discussed importance of compliance Discussed plans with patient for ongoing care management follow up and provided patient with direct contact information for care management team Advised patient, providing education and rationale, to  monitor blood pressure daily and record, calling PCP for findings outside established parameters Reviewed scheduled/upcoming provider appointments  Assessed social determinant of health barriers   Patient Goals/Self-Care Activities Over the next 60 days, patient will:  -Attends all scheduled provider appointments Calls pharmacy for medication refills Calls provider office for new concerns or questions 07/07/23: patient will f/u with Dr. August Saucer regarding ? PT  Follow Up Plan: The Managed Medicaid care management team will reach out to the patient again over the next 45 business  days.  The patient has been provided with contact information for the Managed Medicaid care management team and has been advised to call with any health related questions or concerns.    Follow Up:  Patient agrees to Care Plan and Follow-up.  Plan: The Managed Medicaid care management team will reach out to the patient again over the next 45 business  days. and The  Patient has been provided with contact information for the Managed Medicaid care management team and has been advised to call with any health related questions or concerns.  Date/time of next scheduled RN care management/care coordination outreach:  08/23/23 at 1.

## 2023-07-07 NOTE — Patient Instructions (Signed)
 Visit Information  [name] [name] was given information about Medicaid Managed Care team care coordination services as a part of their [facility] Community Plan Medicaid benefit. [name] [name] verbally consented to engagement with the [facility] Managed Care team.   If you are experiencing a medical emergency, please call 911 or report to your local emergency department or urgent care.   If you have a non-emergency medical problem during routine business hours, please contact your provider's office and ask to speak with a nurse.   For questions related to your [facility], please call: [phone] or visit the homepage here: [email or web address]  If you would like to schedule transportation through your [facility], please call the following number at least 2 days in advance of your appointment: [phone]   Rides for urgent appointments can also be made after hours by calling Member Services.  Call the Behavioral Health Crisis Line at [phone], at any time, 24 hours a day, 7 days a week. If you are in danger or need immediate medical attention call 911.  If you would like help to quit smoking, call 1-800-QUIT-NOW (418-228-8879) OR Espaol: 1-855-Djelo-Ya (6-644-034-7425) o para ms informacin haga clic aqu or Text READY to 956-387 to register via text  [name] [name] - following are the goals we discussed in your visit today:   Goals Addressed             This Visit's Progress    Protect My Health       Timeframe:  Long-Range Goal Priority:  Medium Start Date:      05/07/20                       Expected End Date: ongoing             Follow Up Date: 08/23/23  - schedule appointment for vaccines needed due to my age or health - schedule recommended health tests (blood work, mammogram, colonoscopy, pap test) - schedule and keep appointment for annual check-up    Why is  this important?   Screening tests can find diseases early when they are easier to treat.  Your doctor or nurse will talk with you about which tests are important for you.  Getting shots for common diseases like the flu and shingles will help prevent them.   07/07/23: Upcoming ENT/ID/Allergy and Asthma appts.   Patient verbalizes understanding of instructions and care plan provided today and agrees to view in MyChart. Active MyChart status and patient understanding of how to access instructions and care plan via MyChart confirmed with patient.     The Managed Medicaid care management team will reach out to the patient again over the next 45 business  days.  The  Patient has been provided with contact information for the Managed Medicaid care management team and has been advised to call with any health related questions or concerns.   [name] RNC, BSN, [employer] [facility] RN Care Manager Direct Dial [phone] Website: [email or web address]   Following is a copy of your plan of care:  Care Plan : General Plan of Care (Adult)  Updates made by [name], RN since 07/07/2023 12:00 AM     Problem: Health Promotion or Disease Self-Management (General Plan of Care)   Priority: Medium  Onset Date: 08/10/2020     Long-Range Goal: Self-Management Plan Developed   Start Date: 05/07/2020  Expected End Date: 10/07/2023  Recent Progress: On track  Priority: Medium  Note:   Current Barriers:  Chronic Disease Management support and education needs related to DM, HTN, asthma, GERD, anxiety, osteoarthritis, HLD, HIV 07/07/23: Left shoulder and neck pain continue-recent injections-states she will f/u with ORTHO-? PT appt.  Blood glucose and BP stable.  Ear infection improved-has f/u 3/26.  Nurse Case Manager Clinical Goal(s):  Over the next 30 days, patient will attend all scheduled medical appointments  Interventions:  Inter-disciplinary care team  collaboration (see longitudinal plan of care) Evaluation of current treatment plan and patient's adherence to plan as established by provider. Reviewed medications with patient. Collaborated with pharmacy regarding medications. Discussed plans with patient for ongoing care management follow up and provided patient with direct contact information for care management team Reviewed scheduled/upcoming provider appointments. Pharmacy referral for medication review Patient provided with [facility] transportation information.  Asthma: (Status:New goal.) Long Term Goal Provided education about and advised patient to utilize infection prevention strategies to reduce risk of respiratory infection Discussed the importance of adequate rest and management of fatigue with Asthma Assessed social determinant of health barriers   Diabetes Interventions:  (Status:  New goal.) Long Term Goal Assessed patient's understanding of A1c goal: <7% Reviewed medications with patient and discussed importance of medication adherence Counseled on importance of regular laboratory monitoring as prescribed Discussed plans with patient for ongoing care management follow up and provided patient with direct contact information for care management team Reviewed scheduled/upcoming provider appointments Review of patient status, including review of consultants reports, relevant laboratory and other test results, and medications completed Assessed social determinant of health barriers Lab Results  Component Value Date   HGBA1C 6.5 01/30/23       HGBA1C                                                      6.3                                    05/16/23      HGBA1C                                                      6.5                                    09/13/22  Hyperlipidemia Interventions:  (Status:  New goal.) Long Term Goal Medication review performed; medication list updated in electronic medical record.  Provider established  cholesterol goals reviewed Counseled on importance of regular laboratory monitoring as prescribed Assessed social determinant of health barriers   Hypertension Interventions:  (Status:  New goal.) Long Term Goal Last practice recorded BP readings:  BP Readings from Last 3 Encounters:           05/16/23        125/72 07/01/23        125/72 04/18/23        133/85  Most recent eGFR/CrCl:  Lab Results  Component Value Date   EGFR  73 02/08/2022    No components found for: "CRCL"  Evaluation of current treatment plan related to hypertension self management and patient's adherence to plan as established by provider Reviewed medications with patient and discussed importance of compliance Discussed plans with patient for ongoing care management follow up and provided patient with direct contact information for care management team Advised patient, providing education and rationale, to monitor blood pressure daily and record, calling PCP for findings outside established parameters Reviewed scheduled/upcoming provider appointments  Assessed social determinant of health barriers   Patient Goals/Self-Care Activities Over the next 60 days, patient will:  -Attends all scheduled provider appointments Calls pharmacy for medication refills Calls provider office for new concerns or questions 07/07/23: patient will f/u with Dr. August Saucer regarding ? PT  Follow Up Plan: The Managed Medicaid care management team will reach out to the patient again over the next 45 business  days.  The patient has been provided with contact information for the Managed Medicaid care management team and has been advised to call with any health related questions or concerns.

## 2023-07-11 ENCOUNTER — Other Ambulatory Visit: Payer: Self-pay | Admitting: Allergy and Immunology

## 2023-07-13 ENCOUNTER — Ambulatory Visit: Admitting: Surgical

## 2023-07-13 DIAGNOSIS — M25512 Pain in left shoulder: Secondary | ICD-10-CM | POA: Diagnosis not present

## 2023-07-13 DIAGNOSIS — M19012 Primary osteoarthritis, left shoulder: Secondary | ICD-10-CM | POA: Diagnosis not present

## 2023-07-13 DIAGNOSIS — M5412 Radiculopathy, cervical region: Secondary | ICD-10-CM

## 2023-07-14 ENCOUNTER — Telehealth: Payer: Self-pay | Admitting: Surgical

## 2023-07-14 ENCOUNTER — Other Ambulatory Visit: Payer: Self-pay | Admitting: Student

## 2023-07-14 DIAGNOSIS — I10 Essential (primary) hypertension: Secondary | ICD-10-CM

## 2023-07-14 NOTE — Telephone Encounter (Signed)
 Spoke with patient. She wanted to know if she could take 2 voltaren tablets at a time, but per [facility] she should not. I advised about the heat/ice and also trying a topical ointment or gel OTC. She is already taking gabapentin.

## 2023-07-14 NOTE — Telephone Encounter (Signed)
 Patient called. Would like to know what she could take for pain? Her cb# [phone]

## 2023-07-14 NOTE — Telephone Encounter (Signed)
 Copied from CRM [id number]. Topic: Clinical - Medication Refill >> Jul 14, 2023  3:53 PM Prudencio Pair wrote: Most Recent Primary Care Visit:  Provider: Lovie Macadamia  Department: [facility]  Visit Type: OPEN ESTABLISHED  Date: 05/16/2023  Medication: hydrochlorothiazide (HYDRODIURIL) 25 MG tablet  Has the patient contacted their pharmacy? Yes (Agent: If no, request that the patient contact the pharmacy for the refill. If patient does not wish to contact the pharmacy document the reason why and proceed with request.) (Agent: If yes, when and what did the pharmacy advise?)  Is this the correct pharmacy for this prescription? Yes If no, delete pharmacy and type the correct one.  This is the patient's preferred pharmacy:  [facility] - [place], [place] - [address] [facility] [address] [place] [address] Phone: [phone] Fax: [phone]  [facility] [address] [place] [address] Phone: [phone] Fax: [phone]   Has the prescription been filled recently? Yes  Is the patient out of the medication? Yes, states she has been out for 3 weeks now.  Has the patient been seen for an appointment in the last year OR does the patient have an upcoming appointment? Yes  Can we respond through MyChart? Yes  Agent: Please be advised that Rx refills may take up to 3 business days. We ask that you follow-up with your pharmacy.

## 2023-07-14 NOTE — Telephone Encounter (Signed)
 Okay to take her Voltaren, use ice/heat. Could prescribe gabapentin if she wants to try that

## 2023-07-16 NOTE — Progress Notes (Signed)
 Follow-up Office Visit Note   Patient: [name] [name]           Date of Birth: 1960/09/09           MRN: [id number] Visit Date: 07/13/2023 Requested by: [name], MD [address] [place],  [place] [address] PCP: [name], MD  Subjective: Chief Complaint  Patient presents with   Left Shoulder - Pain    HPI: [name] [name] is a 63 y.o. female who returns to the office for follow-up visit.    Plan at last visit was: Left glenohumeral injection on 05/19/2023  Since then, patient notes she did get relief from the glenohumeral injection but it did not last long enough.  She has a lot of pain specifically with using her left arm such as when she uses a vacuum.  She is taking diclofenac right now with some relief.  She is right-hand dominant.  She also describes in addition to the focal shoulder pain a lot of pain radiating down the entirety of her left arm to her left hand.  She has associated numbness and tingling the same distribution.  She describes a lightening bolt sensation at times down her arm.  No history of prior neck surgery.  She does have prior cervical spine ESI with Dr. Alvester Morin that has given her good relief in the past.              ROS: All systems reviewed are negative as they relate to the chief complaint within the history of present illness.  Patient denies fevers or chills.  Assessment & Plan: Visit Diagnoses:  1. Arthritis of left shoulder region   2. Left shoulder pain, unspecified chronicity   3. Cervical radiculopathy     Plan: [name] [name] is a 63 y.o. female who returns to the office for follow-up visit.  Plan from last visit was noted above in HPI.  They now return with some symptomatic relief from left glenohumeral injection that was administered about 2 months ago.  This did not last as long as she had hoped and she has continued shoulder pain after the initial relief from the injection.  She seems to have pain both from the left shoulder  and some pain in the left arm as referred pain from the cervical spine.  She has had prior cervical spine ESI with good relief of her symptoms.  She has MRI of the cervical spine from April 2024 demonstrating foraminal stenosis at C4-C5 with central disc protrusion at C6-C7 and moderate right and mild left foraminal stenosis at C7-T1.  We will get her set up for cervical spine ESI to see how much of her symptoms is neck related.  We will also order a new MRI of the left shoulder for further evaluation of current status of this left shoulder pathology.  She had prior left shoulder MRI in January 2023 demonstrating mild to moderate arthritis of the glenohumeral joint along with partial-thickness rotator cuff damage of the subscapularis and tendinosis of the supra/infra.  She does have a little bit of infraspinatus weakness as noted on exam today.  Follow-up after MRI to review results and discuss how much relief she got from the cervical spine ESI.  Follow-Up Instructions: No follow-ups on file.   Orders:  Orders Placed This Encounter  Procedures   DL FLUORO GUIDED NEEDLE PLC ASPIRATION / INJECTTION/LOC   MR Shoulder Left w/ contrast   Ambulatory referral to Physical Medicine Rehab  No orders of the defined types were placed in this encounter.     Procedures: No procedures performed   Clinical Data: No additional findings.  Objective: Vital Signs: There were no vitals taken for this visit.  Physical Exam:  Constitutional: Patient appears well-developed HEENT:  Head: Normocephalic Eyes:EOM are normal Neck: Normal range of motion Cardiovascular: Normal rate Pulmonary/chest: Effort normal Neurologic: Patient is alert Skin: Skin is warm Psychiatric: Patient has normal mood and affect  Ortho Exam: Ortho exam demonstrates left shoulder with passive motion reproducing pain.  She has good subscapularis strength rated 5/5.  Infraspinatus strength rated 4/5 of the left shoulder relative to  5/5 in the right shoulder.  Good abduction strength rated 5/5.  No tenderness over the [facility] joint.  She does have some coarseness and crackles noted primarily in the anterolateral shoulder with passive motion of the shoulder at near 90 degrees of abduction.  Tenderness over the bicipital groove.  Axillary nerve intact with deltoid firing.  Intact EPL, FPL, finger abduction, pronation/supination, bicep, tricep, deltoid.  She has negative Spurling and negative Lhermitte signs but she does have reduced range of motion of the cervical spine as well as reduced ability to tilt her ear toward her left shoulder  Specialty Comments:  MRI CERVICAL SPINE WITHOUT CONTRAST   TECHNIQUE: Multiplanar, multisequence MR imaging of the cervical spine was performed. No intravenous contrast was administered.   COMPARISON:  None Available.   FINDINGS: Alignment: No static listhesis. Loss of the normal cervical lordosis with straightening.   Vertebrae: No acute fracture, evidence of discitis, or aggressive bone lesion.   Cord: Normal signal and morphology.   Posterior Fossa, vertebral arteries, paraspinal tissues: Posterior fossa demonstrates no focal abnormality. Vertebral artery flow voids are maintained. Paraspinal soft tissues are unremarkable.   Disc levels:   Discs: Mild degenerative disease with disc height loss at C4-5.   C2-3: No significant disc bulge. No neural foraminal stenosis. No central canal stenosis.   C3-4: Tiny central disc protrusion. No foraminal or central canal stenosis.   C4-5: Mild broad-based disc bulge. Bilateral uncovertebral degenerative changes. Mild bilateral foraminal stenosis. No central canal stenosis.   C5-6: No significant disc bulge. No neural foraminal stenosis. No central canal stenosis.   C6-7: Broad-based disc bulge with a central disc protrusion contacting the ventral cervical spinal cord. No foraminal or central canal stenosis.   C7-T1: Broad-based disc  bulge eccentric towards the right. Moderate right and mild left foraminal stenosis. No spinal stenosis.   IMPRESSION: 1. At C4-5 there is a mild broad-based disc bulge. Bilateral uncovertebral degenerative changes. Mild bilateral foraminal stenosis. 2. At C6-7 there is a broad-based disc bulge with a central disc protrusion contacting the ventral cervical spinal cord. 3. At C7-T1 there is a broad-based disc bulge eccentric towards the right. Moderate right and mild left foraminal stenosis. 4. No acute osseous injury of the cervical spine.     Electronically Signed   By: Elige Ko M.D.   On: 07/28/2022 08:59  Imaging: No results found.   PMFS History: Patient Active Problem List   Diagnosis Date Noted   CAD (coronary artery disease) 03/19/2023   Chest pain 01/30/2023   Type 2 diabetes mellitus without complication, without long-term current use of insulin (HCC) 01/30/2023   Osteopenia after menopause 03/15/2022   Anxiety 03/15/2022   Impingement syndrome of left shoulder 07/01/2021   Osteoarthritis of thumb, right 11/29/2018   Chronic migraine w/o aura w/o status migrainosus, not intractable 08/27/2018  Central perforation of tympanic membrane of left ear 12/09/2016   Eustachian tube dysfunction, left 03/09/2016   Spondylosis of lumbar region without myelopathy or radiculopathy 10/08/2015   Liver fibrosis (HCC) 12/04/2014   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   Vitamin D deficiency 06/29/2014   Secondary adrenal insufficiency (HCC) 06/29/2014   History of tympanostomy tube placement 02/07/2014   Hepatitis C virus infection cured after antiviral drug therapy 03/16/2012   Health care maintenance 12/15/2011   HTN (hypertension) 10/05/2010   Hyperlipidemia associated with type 2 diabetes mellitus (HCC) 10/23/2008   HIV disease (HCC) 05/09/2006   Allergic rhinitis 05/09/2006   GERD 05/09/2006   Past Medical  History:  Diagnosis Date   Allergic rhinitis 05/09/2006   Allergy    Anxiety    Arthritis    Asthma    CAD (coronary artery disease) 03/19/2023   CCTA 03/16/23: CAC score 471 (98th percentile), RCA diffuse 25-49, dLM minimal Ca2+ plaque, LAD prox minimal plaques, pLCx < 24; TPV 542 mm3 (86th percentile)    CHF (congestive heart failure) (HCC)    Chronic back pain    Chronic night sweats 08/23/2022   Diabetes mellitus without complication (HCC) 04/25/2014   GERD (gastroesophageal reflux disease)    Heart murmur    as a child   Hepatitis C    genotype 1b, 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 s/p 12 week course of simeprevir and sofosbuvir between October 2014 and January 2015 with resolution.   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   Pneumonia    Prediabetes    Prediabetes 02/18/2022   Refusal of blood transfusions as patient is Jehovah's Witness    Screening for cervical cancer 10/20/2022   Secondary adrenal insufficiency (HCC) 06/29/2014   Urticaria    Vaginal atrophy 10/20/2022    Family History  Problem Relation Age of Onset   Heart disease Mother    Diabetes Mother    Stroke Mother    Heart disease Father    Stroke Father    Diabetes Father    Hepatitis Sister        hcv   Asthma Sister    Allergic rhinitis Sister    Stroke Other    Colon polyps Brother    Renal Disease Brother    Cancer Sister        lung   Asthma Sister    Allergic rhinitis Sister    Cancer Maternal Aunt    Cancer Maternal Aunt    Colon cancer Neg Hx    Esophageal cancer Neg Hx    Stomach cancer Neg Hx    Rectal cancer Neg Hx    Angioedema Neg Hx    Eczema Neg Hx    Urticaria Neg Hx     Past Surgical History:  Procedure Laterality Date   BUNIONECTOMY     b/l   COLECTOMY     2003 for diverticulitis, had  colostomy bag and then reversed   COLONOSCOPY     HAND SURGERY     HAND SURGERY Right    right thumb surgery Dr Mina Marble 2021   NASAL SINUS SURGERY     SHOULDER ARTHROSCOPY WITH OPEN ROTATOR CUFF REPAIR AND DISTAL CLAVICLE ACROMINECTOMY Left 08/31/2021  Procedure: LEFT SHOULDER ARTHROSCOPY, DEBRIDEMENT,  ROTATOR CUFF TEAR REPAIR;  Surgeon: Cammy Copa, MD;  Location: [facility] OR;  Service: Orthopedics;  Laterality: Left;   SHOULDER SURGERY     left   TONSILLECTOMY     Social History   Occupational History   Occupation: Disabled  Tobacco Use   Smoking status: Former    Current packs/day: 0.00    Types: Cigarettes    Start date: 04/26/1987    Quit date: 04/26/2007    Years since quitting: 16.2   Smokeless tobacco: Never   Tobacco comments:    QUIT 2009  Vaping Use   Vaping status: Never Used  Substance and Sexual Activity   Alcohol use: No    Alcohol/week: 0.0 standard drinks of alcohol   Drug use: Yes    Types: Marijuana    Comment: cannabis in the past   Sexual activity: Not Currently    Partners: Male    Birth control/protection: Condom    Comment: declined condoms

## 2023-07-17 MED ORDER — HYDROCHLOROTHIAZIDE 25 MG PO TABS: 25.0000 mg | ORAL_TABLET | Freq: Every day | ORAL | 2 refills | Status: DC

## 2023-07-18 ENCOUNTER — Telehealth: Payer: Self-pay | Admitting: Surgical

## 2023-07-18 ENCOUNTER — Other Ambulatory Visit: Payer: Self-pay | Admitting: Surgical

## 2023-07-18 NOTE — Telephone Encounter (Signed)
 Can try one-time RX of something like Tylenol with codeine or Tramadol while we figure out her shoulder and get her ready for neck injection. She has allergy to morphine with hives/SOB, can she take Tylenol with codeine or Tramadol?

## 2023-07-18 NOTE — Telephone Encounter (Signed)
 Patient called. Says she is in a lot of pain. What can she do?

## 2023-07-19 ENCOUNTER — Other Ambulatory Visit: Payer: Self-pay | Admitting: Surgical

## 2023-07-19 DIAGNOSIS — H90A21 Sensorineural hearing loss, unilateral, right ear, with restricted hearing on the contralateral side: Secondary | ICD-10-CM | POA: Diagnosis not present

## 2023-07-19 DIAGNOSIS — H90A32 Mixed conductive and sensorineural hearing loss, unilateral, left ear with restricted hearing on the contralateral side: Secondary | ICD-10-CM | POA: Diagnosis not present

## 2023-07-19 MED ORDER — TRAMADOL HCL 50 MG PO TABS: 50.0000 mg | ORAL_TABLET | Freq: Two times a day (BID) | ORAL | 0 refills | Status: DC | PRN

## 2023-07-19 NOTE — Telephone Encounter (Signed)
Sent in RX for tramadol

## 2023-07-31 ENCOUNTER — Telehealth: Payer: Self-pay | Admitting: Surgical

## 2023-07-31 ENCOUNTER — Other Ambulatory Visit: Payer: Self-pay | Admitting: Allergy and Immunology

## 2023-07-31 ENCOUNTER — Other Ambulatory Visit: Payer: Self-pay | Admitting: Surgical

## 2023-07-31 MED ORDER — DIAZEPAM 5 MG PO TABS: 5.0000 mg | ORAL_TABLET | Freq: Once | ORAL | 0 refills | Status: AC

## 2023-07-31 NOTE — Telephone Encounter (Signed)
Sent in valium

## 2023-07-31 NOTE — Telephone Encounter (Signed)
 Patient called and said that she is having her MRI done and needs medication to be calm. [phone]

## 2023-07-31 NOTE — Telephone Encounter (Signed)
 Called and advised pt.

## 2023-08-01 NOTE — Telephone Encounter (Signed)
 Refills for Symbicort 160 mcg and cetirizine 10 mg sent to [facility] pharmacy x 1 with 0 refills.  Patient is due for an OV soon.

## 2023-08-03 DIAGNOSIS — D352 Benign neoplasm of pituitary gland: Secondary | ICD-10-CM | POA: Diagnosis not present

## 2023-08-03 DIAGNOSIS — M810 Age-related osteoporosis without current pathological fracture: Secondary | ICD-10-CM | POA: Diagnosis not present

## 2023-08-03 DIAGNOSIS — E2749 Other adrenocortical insufficiency: Secondary | ICD-10-CM | POA: Diagnosis not present

## 2023-08-03 DIAGNOSIS — E1140 Type 2 diabetes mellitus with diabetic neuropathy, unspecified: Secondary | ICD-10-CM | POA: Diagnosis not present

## 2023-08-07 ENCOUNTER — Ambulatory Visit
Admission: RE | Admit: 2023-08-07 | Discharge: 2023-08-07 | Disposition: A | Discharge status: Home / Self Care | Source: Ambulatory Visit | Attending: Surgical

## 2023-08-07 DIAGNOSIS — M25512 Pain in left shoulder: Secondary | ICD-10-CM

## 2023-08-07 DIAGNOSIS — M19012 Primary osteoarthritis, left shoulder: Secondary | ICD-10-CM

## 2023-08-07 MED ORDER — IOPAMIDOL (ISOVUE-M 200) INJECTION 41%
10.0000 mL | Freq: Once | INTRAMUSCULAR | Status: AC
Start: 2023-08-07 — End: 2023-08-07
  Administered 2023-08-07: 10 mL via INTRA_ARTICULAR

## 2023-08-08 ENCOUNTER — Other Ambulatory Visit (HOSPITAL_COMMUNITY): Payer: Self-pay

## 2023-08-08 ENCOUNTER — Other Ambulatory Visit: Payer: Self-pay

## 2023-08-08 NOTE — Progress Notes (Signed)
 Specialty Pharmacy Refill Coordination Note  [name] [name] is a 63 y.o. female assessed today regarding refills of clinic administered specialty medication(s) Cabotegravir & Rilpivirine (CABENUVA)   Clinic requested Courier to Provider Office   Delivery date: 08/21/23   Verified address: [address] [place] [address]   Medication will be filled on 08/18/23.

## 2023-08-14 ENCOUNTER — Ambulatory Visit: Payer: Self-pay | Admitting: Student

## 2023-08-14 VITALS — BP 152/81 | HR 75 | Temp 97.7°F | Wt 177.3 lb

## 2023-08-14 DIAGNOSIS — E1169 Type 2 diabetes mellitus with other specified complication: Secondary | ICD-10-CM

## 2023-08-14 DIAGNOSIS — E2749 Other adrenocortical insufficiency: Secondary | ICD-10-CM | POA: Diagnosis not present

## 2023-08-14 DIAGNOSIS — I10 Essential (primary) hypertension: Secondary | ICD-10-CM

## 2023-08-14 DIAGNOSIS — E119 Type 2 diabetes mellitus without complications: Secondary | ICD-10-CM | POA: Diagnosis not present

## 2023-08-14 DIAGNOSIS — B20 Human immunodeficiency virus [HIV] disease: Secondary | ICD-10-CM

## 2023-08-14 DIAGNOSIS — K219 Gastro-esophageal reflux disease without esophagitis: Secondary | ICD-10-CM

## 2023-08-14 DIAGNOSIS — Z0000 Encounter for general adult medical examination without abnormal findings: Secondary | ICD-10-CM

## 2023-08-14 DIAGNOSIS — F419 Anxiety disorder, unspecified: Secondary | ICD-10-CM

## 2023-08-14 MED ORDER — HYDROCHLOROTHIAZIDE 25 MG PO TABS: 25.0000 mg | ORAL_TABLET | Freq: Every day | ORAL | 3 refills | Status: DC

## 2023-08-14 MED ORDER — METOPROLOL TARTRATE 50 MG PO TABS: 50.0000 mg | ORAL_TABLET | Freq: Two times a day (BID) | ORAL | 3 refills | Status: AC

## 2023-08-14 MED ORDER — PANTOPRAZOLE SODIUM 40 MG PO TBEC: 40.0000 mg | DELAYED_RELEASE_TABLET | Freq: Two times a day (BID) | ORAL | 3 refills | Status: AC

## 2023-08-14 MED ORDER — FAMOTIDINE 40 MG PO TABS: 40.0000 mg | ORAL_TABLET | Freq: Every evening | ORAL | 3 refills | Status: AC

## 2023-08-14 MED ORDER — SERTRALINE HCL 100 MG PO TABS: 100.0000 mg | ORAL_TABLET | Freq: Every day | ORAL | 1 refills | Status: AC

## 2023-08-14 MED ORDER — ATORVASTATIN CALCIUM 10 MG PO TABS: 5.0000 mg | ORAL_TABLET | Freq: Every day | ORAL | 10 refills | Status: AC

## 2023-08-14 MED ORDER — AMLODIPINE BESYLATE 10 MG PO TABS: 10.0000 mg | ORAL_TABLET | Freq: Every day | ORAL | 3 refills | Status: AC

## 2023-08-14 MED ORDER — EZETIMIBE 10 MG PO TABS: 10.0000 mg | ORAL_TABLET | Freq: Every day | ORAL | 3 refills | Status: AC

## 2023-08-14 NOTE — Progress Notes (Signed)
 Subjective:  CC: Diabetes follow up  HPI:  [name] is a 63 y.o. female with a past medical history stated below and presents today for diabetes follow up. Please see problem based assessment and plan for additional details.  Past Medical History:  Diagnosis Date   Allergic rhinitis 05/09/2006   Allergy     Anxiety    Arthritis    Asthma    CAD (coronary artery disease) 03/19/2023   CCTA 03/16/23: CAC score 471 (98th percentile), RCA diffuse 25-49, dLM minimal Ca2+ plaque, LAD prox minimal plaques, pLCx < 24; TPV 542 mm3 (86th percentile)    CHF (congestive heart failure) (HCC)    Chronic back pain    Chronic night sweats 08/23/2022   Diabetes mellitus without complication (HCC) 04/25/2014   GERD (gastroesophageal reflux disease)    Heart murmur    as a child   Hepatitis C    genotype 1b, 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 s/p 12 week course of simeprevir and sofosbuvir between October 2014 and January 2015 with resolution.   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 6 mL 6   cetirizine  (ZYRTEC ) 10 MG tablet TAKE TWO TABLETS BY MOUTH TWICE A DAY 120 tablet 0   chlorhexidine  (PERIDEX ) 0.12 % solution USE AS DIRECTED 15 MLS IN THE MOUTH  OR THROAT TWICE A DAY 473 mL 1   cholecalciferol  (VITAMIN D3) 25 MCG (1000 UNIT) tablet Take 3 tablets (3,000 Units total) by mouth daily. 90 tablet 11   ciprofloxacin -dexamethasone  (CIPRODEX ) OTIC suspension Place 4 drops into the left ear as needed (ear infection).     diclofenac  (VOLTAREN ) 50 MG EC tablet TAKE 1 TABLET (50 MG TOTAL) BY MOUTH TWO (TWO) TIMES DAILY 60 tablet 0   diphenoxylate -atropine  (LOMOTIL ) 2.5-0.025 MG tablet Take by mouth 4 (four) times daily as needed for diarrhea or loose stools.     GLOBAL EASE INJECT PEN NEEDLES 32G X 4 MM MISC USE AS DIRECTED WITH VICTOZA for 30     hydrocortisone  (CORTEF ) 5 MG tablet Take 2.5-5 mg by mouth See admin instructions. Take 1 tablet (5 mg) by mouth in the morning (scheduled) & may taken an additional 0.5 tablet (2.5 mg) by mouth in the evening if needed.     liraglutide (VICTOZA) 18 MG/3ML SOPN Inject 1.8 mg into the skin every evening.     montelukast  (SINGULAIR ) 10 MG tablet Take 1 tablet (10 mg total) by mouth at bedtime. 90 tablet 1   NEURONTIN  100 MG capsule TAKE 1-2 CAPSULES EVERY 3-4 TIMES DAILY. 240 capsule 0   nitroGLYCERIN  (NITROSTAT ) 0.4 MG SL tablet Place 1  tablet (0.4 mg total) under the tongue every 5 (five) minutes as needed for chest pain. 25 tablet 1   nortriptyline  (PAMELOR ) 10 MG capsule TAKE TWO CAPSULES BY MOUTH AT BEDTIME 180 capsule 10   ondansetron  (ZOFRAN ) 4 MG tablet Take 1 tablet (4 mg total) by mouth every 8 (eight) hours as needed for nausea or vomiting. 20 tablet 0   potassium chloride  SA (KLOR-CON  M) 20 MEQ tablet Take 2 tablets (40 mEq total) by mouth daily. 180 tablet 3   psyllium (HYDROCIL/METAMUCIL) 95 % PACK Take 1 packet by mouth daily.     RESTASIS 0.05 % ophthalmic emulsion Place 1 drop into both eyes 2 (two) times daily.     sodium chloride  (OCEAN) 0.65 % SOLN nasal spray PLACE 1 SPRAY INTO BOTH NOSTRILS AS NEEDED FOR CONGESTION. 3 Bottle 0   SUMAtriptan  (IMITREX ) 25 MG tablet Take 1 tablet (25 mg  total) by mouth every 2 (two) hours as needed for migraine. May repeat in 2 hours if headache persists or recurs. 12 tablet 6   SYMBICORT  160-4.5 MCG/ACT inhaler INHALE TWO PUFFS INTO THE LUNGS TWO (TWO) TIMES DAILY. INHALE TWO PUFFS INTO THE LUNGS IN THE MORNING AND AT BEDTIME 30.6 g 0   traMADol  (ULTRAM ) 50 MG tablet Take 1 tablet (50 mg total) by mouth every 12 (twelve) hours as needed. 20 tablet 0   triamcinolone  (NASACORT ) 55 MCG/ACT AERO nasal inhaler Place 1 spray into the nose 2 (two) times daily. 1 spray each nostril 2 times per day 50.7 g 1   VENTOLIN  HFA 108 (90 Base) MCG/ACT inhaler INHALE TWO PUFFS INTO THE LUNGS EVERY 4 (FOUR) HOURS AS NEEDED FOR WHEEZING OR SHORTNESS OF BREATH 18 g 0   No current facility-administered medications on file prior to visit.    Family History  Problem Relation Age of Onset   Heart disease Mother    Diabetes Mother    Stroke Mother    Heart disease Father    Stroke Father    Diabetes Father    Hepatitis Sister        hcv   Asthma Sister    Allergic rhinitis Sister    Stroke Other    Colon polyps Brother    Renal Disease Brother    Cancer Sister        lung   Asthma Sister    Allergic rhinitis Sister    Cancer Maternal Aunt    Cancer Maternal Aunt    Colon cancer Neg Hx    Esophageal cancer Neg Hx    Stomach cancer Neg Hx    Rectal cancer Neg Hx    Angioedema Neg Hx    Eczema Neg Hx    Urticaria Neg Hx     Social History   Socioeconomic History   Marital status: Single    Spouse name: Not on file   Number of children: 0   Years of education: 12   Highest education level: 12th grade  Occupational History   Occupation: Disabled  Tobacco Use   Smoking status: Former    Current packs/day: 0.00    Types: Cigarettes    Start date: 04/26/1987    Quit date: 04/26/2007    Years since quitting: 16.3   Smokeless tobacco: Never   Tobacco comments:    QUIT 2009  Vaping Use   Vaping status: Never Used  Substance and Sexual Activity    Alcohol use: No    Alcohol/week: 0.0 standard drinks of alcohol  Drug use: Yes    Types: Marijuana    Comment: cannabis in the past   Sexual activity: Not Currently    Partners: Male    Birth control/protection: Condom    Comment: declined condoms  Other Topics Concern   Not on file  Social History Narrative   Lives at home alone.   Right-handed.   Occasional tea or soda.   Te   Social Drivers of Corporate investment banker Strain: Low Risk  (08/14/2023)   Overall Financial Resource Strain (CARDIA)    Difficulty of Paying Living Expenses: Not very hard  Food Insecurity: No Food Insecurity (08/14/2023)   Hunger Vital Sign    Worried About Running Out of Food in the Last Year: Never true    Ran Out of Food in the Last Year: Never true  Transportation Needs: No Transportation Needs (08/14/2023)   PRAPARE - Administrator, Civil Service (Medical): No    Lack of Transportation (Non-Medical): No  Physical Activity: Unknown (08/14/2023)   Exercise Vital Sign    Days of Exercise per Week: 2 days    Minutes of Exercise per Session: Patient declined  Stress: Stress Concern Present (08/14/2023)   [employer] of Occupational Health - Occupational Stress Questionnaire    Feeling of Stress : Rather much  Social Connections: Moderately Integrated (08/14/2023)   Social Connection and Isolation Panel [NHANES]    Frequency of Communication with Friends and Family: More than three times a week    Frequency of Social Gatherings with Friends and Family: Never    Attends Religious Services: More than 4 times per year    Active Member of [employer] or Organizations: Yes    Attends Engineer, structural: More than 4 times per year    Marital Status: Never married  Intimate Partner Violence: Not At Risk (07/07/2023)   Humiliation, Afraid, Rape, and Kick questionnaire    Fear of Current or Ex-Partner: No    Emotionally Abused: No    Physically Abused: No    Sexually Abused:  No    Review of Systems: ROS negative except for what is noted on the assessment and plan.  Objective:   Vitals:   08/14/23 0920 08/14/23 0946  BP: (!) 159/90 (!) 152/81  Pulse: 85 75  Temp: 97.7 F (36.5 C)   TempSrc: Oral   SpO2: 100%   Weight: 177 lb 4.8 oz (80.4 kg)     Physical Exam: Constitutional: well-appearing woman sitting in chair, in no acute distress HENT: normocephalic atraumatic, mucous membranes moist Eyes: conjunctiva non-erythematous Neck: supple Cardiovascular: regular rate and rhythm, no m/r/g Pulmonary/Chest: normal work of breathing on room air, lungs clear to auscultation bilaterally Abdominal: protuberant abdomen, soft, non-tender MSK: normal bulk and tone Neurological: alert & oriented x 3, normal gait Skin: warm and dry Psych: Appropriate mood and affect     Assessment & Plan:   Type 2 diabetes mellitus without complication, without long-term current use of insulin  (HCC) Well controlled DM, even on chronic steroid medications. Discussed diet and exercise today; patient has been making changes to candy consumpton habits. Discussed cardiovascular movement, which at times she is not able to do due to pain.  - Check Urine microalbumin/Cr ratio - Check renal function today - Foot exam today, normal - Patient also follow with Dr. Kathyanne Parkers  Secondary adrenal insufficiency [facility]) Reviewed Dr. Antoniette Klein recent notes. Continues to take Hydrocortisone  5 mg in the morning, and 2.5 mg at 2 PM. No  recent illness  HIV disease (HCC) Adherent to Cabenuba treatment; last injection in March, next in July 2025  HTN (hypertension) Uncontrolled today. Has been adherent to medications. No home blood pressure log today. She is currently on metoprolol  50 mg BID, amlodipine  10 mg daily, and HCtZ 25 mg daily. Weight increase likely contributory ( 177lb today vs 171lb a month ago) Plan - Will ask her to bring her BP log in ~7 weeks - Dietary changes reviewed - Will check  kidney function today  Health care maintenance Refilled medications today    Return in about 7 weeks (around 10/03/2023) for Hypertension.  Patient discussed with Dr.  Michael Ades, MD [facility]  08/14/2023, 9:56 AM

## 2023-08-14 NOTE — Assessment & Plan Note (Signed)
Refilled medications today

## 2023-08-14 NOTE — Assessment & Plan Note (Addendum)
 Uncontrolled today. Has been adherent to medications. No home blood pressure log today. She is currently on metoprolol  50 mg BID, amlodipine  10 mg daily, and HCtZ 25 mg daily. Weight increase likely contributory ( 177lb today vs 171lb a month ago) Plan - Will ask her to bring her BP log in ~7 weeks - Dietary changes reviewed - Will check kidney function today

## 2023-08-14 NOTE — Progress Notes (Signed)
 Internal Medicine Clinic Attending  Case discussed with the resident at the time of the visit.  We reviewed the resident's history and exam and pertinent patient test results.  I agree with the assessment, diagnosis, and plan of care documented in the resident's note.

## 2023-08-14 NOTE — Assessment & Plan Note (Signed)
 Adherent to [place] treatment; last injection in March, next in July 2025

## 2023-08-14 NOTE — Assessment & Plan Note (Addendum)
 Reviewed Dr. Antoniette Klein recent notes. Continues to take Hydrocortisone  5 mg in the morning, and 2.5 mg at 2 PM. No recent illness

## 2023-08-14 NOTE — Patient Instructions (Addendum)
 Thank you, Ms.[name] for allowing us  to provide your care today. Today we discussed     - Well controlled DM- congratulations - Continue your blood pressure medications to keep your blood pressure under control. I think it would beneficial for you to keep a log of your blood pressure. That way we can see the trend when you are not in the office. BLOOD PRESSURE  Please check your blood pressure daily following the instructions below AND bring your readings to the next outpatient visit  Blood pressure tips   It is best to check your BP 1-2 hours after taking your medications to see the medications effectiveness on your BP.    Here are some tips that our clinical pharmacists share for home BP monitoring:          Rest 10 minutes before taking your blood pressure.          Don't smoke or drink caffeinated beverages for at least 30 minutes before.          Take your blood pressure before (not after) you eat.          Sit comfortably with your back supported and both feet on the floor (don't cross your legs).          Elevate your arm to heart level on a table or a desk.          Use the proper sized cuff. It should fit smoothly and snugly around your bare upper arm. There should be enough room to slip a fingertip under the cuff. The bottom edge of the cuff should be 1 inch above the crease of the elbow.     Take ALL your medications before you return to clinic   Please follow up with your infectious disease doctor and Cabenuba injections.  I have also refilled your medications  I have ordered the following labs for you:  Lab Orders         Urine microalbumin-creatinine with uACR         Renal function panel with eGFR         Lipid Profile         POC Hbg A1C      I will call if any are abnormal. All of your labs can be accessed through "My Chart".   My Chart Access: [email or web address]  Please follow-up in: 3 months for  diabetes, hypertension.    We look forward to seeing you next time. Please call our clinic at [phone] if you have any questions or concerns. The best time to call is Monday-Friday from 9am-4pm, but there is someone available 24/7. If after hours or the weekend, call the main hospital number and ask for the Internal Medicine Resident On-Call. If you need medication refills, please notify your pharmacy one week in advance and they will send us  a request.   Thank you for letting us  take part in your care. Wishing you the best!  Bowden Clunes, MD 08/14/2023, 9:41 AM [facility]

## 2023-08-14 NOTE — Assessment & Plan Note (Addendum)
 Well controlled DM, even on chronic steroid medications. Discussed diet and exercise today; patient has been making changes to candy consumpton habits. Discussed cardiovascular movement, which at times she is not able to do due to pain.  - Check Urine microalbumin/Cr ratio - Check renal function today - Foot exam today, normal - Patient also follow with Dr. Kathyanne Parkers

## 2023-08-15 LAB — RENAL FUNCTION PANEL
Albumin: 4.4 g/dL (ref 3.9–4.9)
BUN/Creatinine Ratio: 16 (ref 12–28)
BUN: 14 mg/dL (ref 8–27)
CO2: 22 mmol/L (ref 20–29)
Calcium: 9.5 mg/dL (ref 8.7–10.3)
Chloride: 101 mmol/L (ref 96–106)
Creatinine, Ser: 0.87 mg/dL (ref 0.57–1.00)
Glucose: 208 mg/dL — ABNORMAL HIGH (ref 70–99)
Phosphorus: 3.8 mg/dL (ref 3.0–4.3)
Potassium: 3.8 mmol/L (ref 3.5–5.2)
Sodium: 140 mmol/L (ref 134–144)
eGFR: 76 mL/min/{1.73_m2} (ref >59)

## 2023-08-15 LAB — LIPID PANEL
Chol/HDL Ratio: 3.6 ratio (ref 0.0–4.4)
Cholesterol, Total: 153 mg/dL (ref 100–199)
HDL: 42 mg/dL (ref >39)
LDL Chol Calc (NIH): 77 mg/dL (ref 0–99)
Triglycerides: 206 mg/dL — ABNORMAL HIGH (ref 0–149)
VLDL Cholesterol Cal: 34 mg/dL (ref 5–40)

## 2023-08-15 LAB — HEMOGLOBIN A1C
Est. average glucose Bld gHb Est-mCnc: 146 mg/dL
Hgb A1c MFr Bld: 6.7 % — ABNORMAL HIGH (ref 4.8–5.6)

## 2023-08-15 LAB — MICROALBUMIN / CREATININE URINE RATIO
Creatinine, Urine: 55.9 mg/dL
Microalb/Creat Ratio: <5 mg/g{creat} (ref 0–29)
Microalbumin, Urine: <3 ug/mL

## 2023-08-16 ENCOUNTER — Other Ambulatory Visit: Payer: Self-pay | Admitting: Allergy and Immunology

## 2023-08-18 ENCOUNTER — Other Ambulatory Visit: Payer: Self-pay

## 2023-08-18 NOTE — Progress Notes (Signed)
 Discussed with patient. No changes. She will see Dr. Kathyanne Parkers in May.

## 2023-08-21 ENCOUNTER — Telehealth: Payer: Self-pay

## 2023-08-21 NOTE — Telephone Encounter (Signed)
 [facility] Patient Advocate Encounter  Patient's medications Cabenuva  have been couriered to [facility] from [facility] and will be administered at the patients appointment on 08/24/23.  Roylene Corn, CPhT Specialty Pharmacy Patient [facility] Phone: [phone] Fax:  [phone]

## 2023-08-24 ENCOUNTER — Telehealth: Payer: Self-pay

## 2023-08-24 ENCOUNTER — Encounter: Payer: Self-pay | Admitting: Infectious Diseases

## 2023-08-24 ENCOUNTER — Ambulatory Visit: Admitting: Infectious Diseases

## 2023-08-24 ENCOUNTER — Other Ambulatory Visit (HOSPITAL_COMMUNITY): Payer: Self-pay

## 2023-08-24 ENCOUNTER — Other Ambulatory Visit: Payer: Self-pay

## 2023-08-24 VITALS — BP 132/82 | HR 70 | Resp 16 | Ht 61.0 in | Wt 163.0 lb

## 2023-08-24 DIAGNOSIS — R229 Localized swelling, mass and lump, unspecified: Secondary | ICD-10-CM

## 2023-08-24 DIAGNOSIS — B20 Human immunodeficiency virus [HIV] disease: Secondary | ICD-10-CM | POA: Diagnosis not present

## 2023-08-24 DIAGNOSIS — R61 Generalized hyperhidrosis: Secondary | ICD-10-CM

## 2023-08-24 DIAGNOSIS — L302 Cutaneous autosensitization: Secondary | ICD-10-CM

## 2023-08-24 MED ORDER — CABOTEGRAVIR & RILPIVIRINE ER 600 & 900 MG/3ML IM SUER
1.0000 | Freq: Once | INTRAMUSCULAR | Status: AC
  Administered 2023-08-24: 1 via INTRAMUSCULAR

## 2023-08-24 MED ORDER — VEOZAH 45 MG PO TABS: 1.0000 | ORAL_TABLET | Freq: Every day | ORAL | 0 refills | Status: DC

## 2023-08-24 MED ORDER — BETAMETHASONE VALERATE 0.1 % EX OINT
1.0000 | TOPICAL_OINTMENT | Freq: Two times a day (BID) | CUTANEOUS | 0 refills | Status: AC
Start: 2023-08-24 — End: ?

## 2023-08-24 NOTE — Patient Instructions (Addendum)
 10/24/2023 is your next injection with our pharmacy team.   I will plan to see you back in September or November depending on if we can get a medication started for night sweats.   If the insurance won't approve the Veozah  (medicine for night sweats and hot flashes) we could try a Clonidine patch or refer you to gynecology to talk about hormone replacement therapy.   [facility] -  Address: [address], [place], [place] [address] Phone: [phone]   Topical steroid ointment for elbows - mix a little with your lotion to get it to spread out better on elbows. May be discoloration from the steroids

## 2023-08-24 NOTE — Telephone Encounter (Signed)
 Received notification from [facility] MEDICAID regarding a prior authorization for Veozah . Authorization has been APPROVED from 08/24/2023 to 08/23/2024.   Per test claim, copay for 30 days supply is $4.00  Patient can continue to fill through  [employer] .  Authorization # [id number] Phone #  [phone]

## 2023-08-24 NOTE — Telephone Encounter (Signed)
 Submitted a Prior Authorization request to  [facility]  for Veozah  via CoverMyMeds. Will update once we receive a response.    PA ID:  [id number]

## 2023-08-24 NOTE — Progress Notes (Signed)
 Subjective:    [name], female    DOB: [birth year], 62 y.o.   MRN: [id number]  Discussed the use of AI scribe software for clinical note transcription with the patient, who gave verbal consent to proceed.   Chief Complaint  Patient presents with   Follow-up    Cab      Brief Narrative:  [name] [name] is a 63 y.o. female with well controlled HIV Dx 1992 stage 3. She is a type 2 diabetic, on chronic prednisone  for Addison's disease a h/o pituitary adenoma.  History of treated Hepatitis C (completed 04/2013 with SVR).   Previous regimens for HIV treatment: Lexiva/Norvir /Truvada , Isentress /Truvada  (rashes) Prezcobix /Truvada  (rash) Biktarvy  2018 - 2024 Cabenuva  2024    History of Present Illness: The patient, with a history of HIV, diabetes, and adrenal disease, presented for a routine follow-up. They requested assistance with paperwork for a credit protection program, seeking a six-month extension due to their medical conditions.  Switched from metoprolol  to carvedilol  recently and had some possible adverse effects, including rapid heart rate and aching in the lower part of their heart, leading to a switch back to metoprolol . The patient also reported sharp pains shooting through their heart, particularly during exertion. She also reported shortness of breath, particularly during exertion, such as making their bed. They denied any swelling in their legs or significant cough. They have a history of congestive heart failure but did not believe their current symptoms were related to this condition. [name] has been working with her cardiology team and has an echo and coronary CT scheduled soon for further evaluation. The patient has a family history of heart disease, with a brother who had a heart attack before passing away.  The patient's last stress test, conducted two years ago, showed no evidence of ischemia or clotting.  The patient is currently on Cabenuva  for HIV  management and reported no adverse effects from this medication. They also reported some weight loss, which they attributed to a decrease in appetite due to nausea.    Review of Systems  Constitutional:  Positive for appetite change and fatigue.  Respiratory:  Positive for shortness of breath. Negative for cough and wheezing.   Cardiovascular:  Positive for chest pain. Negative for palpitations and leg swelling.  Musculoskeletal:  Negative for joint swelling.  Neurological:  Positive for dizziness and light-headedness.       Objective:   Today's Vitals   08/24/23 1100  BP: 132/82  Pulse: 70  Resp: 16  SpO2: 99%  Weight: 163 lb (73.9 kg)  Height: 5\' 1"  (1.549 m)  PainSc: 0-No pain   Body mass index is 30.8 kg/m.    Physical Exam Constitutional:      Appearance: Normal appearance. She is not ill-appearing.  HENT:     Mouth/Throat:     Mouth: Mucous membranes are moist.     Pharynx: Oropharynx is clear.  Eyes:     General: No scleral icterus. Cardiovascular:     Rate and Rhythm: Normal rate and regular rhythm.     Pulses: Normal pulses.     Heart sounds: No murmur heard. Pulmonary:     Effort: Pulmonary effort is normal. No respiratory distress.  Abdominal:     General: Bowel sounds are normal. There is no distension.  Musculoskeletal:        General: No swelling.     Right lower leg: No edema.     Left lower leg: No edema.  Neurological:  Mental Status: She is oriented to person, place, and time.  Psychiatric:        Mood and Affect: Mood normal.        Thought Content: Thought content normal.     Vitals:   08/24/23 1100  Weight: 163 lb (73.9 kg)  Height: 5\' 1"  (1.549 m)   Filed Weights   08/24/23 1100  Weight: 163 lb (73.9 kg)       Assessment & Plan:   HIV Infection -  VL< 20, CD4 > 500 - Very well controlled on Cabenuva -LA every 2 months. No concerns with access or adherence to medication. They are tolerating the medication well without side  effects. No drug interactions identified. Pertinent lab tests ordered today.  No changes to insurance coverage.  No dental needs today.  No concern over anxious/depressed mood.  Sexual health and family planning discussed - pap smear to be collected in 2026.     Night sweats - Chronic night sweats likely menopausal, non-hormonal treatment preferred due to cardiac risk without specialty GYN assessment. Gabapentin  ineffective. Fezolinetant  may be helpful for her, may require prior authorization. Clonidine patch may also be an option if the first is not approved.  - Attempt prior authorization for Fezolinetant  (Veozah ) once daily.  - Consider clonidine patch if Fezolinetant  not approved. - Refer to gynecology for hormone replacement therapy if non-hormonal treatments ineffective.  Eczema -  Eczema on arms unresponsive to OTC cortisone. Possible hyperpigmentation from Addison/s. Higher potency topical steroid discussed. - Prescribe Valisone  ointment. - Instruct to mix Valisone  with moisturizer, apply to elbows twice daily.  Skin Nodule Right Forearm -  Feels like cyst. She would like this removed. Referral to dermatology placed.    Visit duration: 24 minutes      Meds ordered this encounter  Medications   cabotegravir  & rilpivirine  ER (CABENUVA ) 600 & 900 MG/3ML injection 1 kit   betamethasone  valerate ointment (VALISONE ) 0.1 %    Sig: Apply 1 Application topically 2 (two) times daily.    Dispense:  30 g    Refill:  0   Fezolinetant  (VEOZAH ) 45 MG TABS    Sig: Take 1 tablet (45 mg total) by mouth daily.    Dispense:  30 tablet    Refill:  0   Orders Placed This Encounter  Procedures   HIV 1 RNA quant-no reflex-bld   T-helper cells (CD4) count   Hepatic function panel   Ambulatory referral to Dermatology    Referral Priority:   Routine    Referral Type:   Consultation    Referral Reason:   Specialty Services Required    Requested Specialty:   Dermatology    Number of Visits  Requested:   1   10/24/2023 is her next injection appt. TD 6th, odd months   Gibson Kurtz, MSN, NP-C [facility]  [email or web address] Pager: [phone] Office: [phone] [facility] Main Line: [phone]

## 2023-08-24 NOTE — Telephone Encounter (Signed)
 Rcvd Prior Auth for Valisone  provider is working with Abe Abed to see if Clonidine patch can be covered as PA is likely to be denied.

## 2023-08-25 ENCOUNTER — Ambulatory Visit: Admitting: Surgical

## 2023-08-25 DIAGNOSIS — Z9889 Other specified postprocedural states: Secondary | ICD-10-CM | POA: Diagnosis not present

## 2023-08-25 LAB — T-HELPER CELLS (CD4) COUNT (NOT AT ARMC)
CD4 % Helper T Cell: 29 % — ABNORMAL LOW (ref 33–65)
CD4 T Cell Abs: 518 /uL (ref 400–1790)

## 2023-08-27 ENCOUNTER — Encounter: Payer: Self-pay | Admitting: Surgical

## 2023-08-27 LAB — HIV-1 RNA QUANT-NO REFLEX-BLD
HIV 1 RNA Quant: 21 {copies}/mL — ABNORMAL HIGH
HIV-1 RNA Quant, Log: 1.32 {Log_copies}/mL — ABNORMAL HIGH

## 2023-08-27 NOTE — Progress Notes (Signed)
 Office Visit Note   Patient: [name] [name]           Date of Birth: Jun 14, 1960           MRN: [id number] Visit Date: 08/25/2023 Requested by: Geisel Clunes, MD [address] [place],  [place] [address] PCP: Nowack Clunes, MD  Subjective: Chief Complaint  Patient presents with   Left Shoulder - Pain, Follow-up    MRI review    HPI: [name] [name] is a 63 y.o. female who presents to the office for MRI review. Patient denies any changes in symptoms.  Continues to complain mainly of shoulder pain that is worse all the time but also worse with activity and bad weather.  She describes pain in the anterior lateral aspect of the left shoulder with some radiation to the elbow.  She also has scapular pain in both the left and right shoulder blades with associated neck pain.  She has cervical spine ESI with Dr. Daisey Dryer coming up early next week.  The hand numbness that she previously was having has resolved.  She feels weak primarily with lifting her arm out to the side.  Not taking any medications for pain.  Only takes occasional tramadol .  MRI results revealed: MR Shoulder Left w/ contrast Result Date: 08/18/2023 MR SHOULDER WITH IV CONTRAST LEFT COMPARISON: 05/24/2021 CLINICAL HISTORY: Chronic left shoulder pain following rotator cuff repair. PULSE SEQUENCES: Ax PD FS, Sag T2 FS, Cor T1 & COR T2 FS following intra-articular placement of dilute gadolinium paste contrast. FINDINGS: Bones: Moderate AC joint arthrosis is present. Extensive subacromial decompression changes are present. There is mild hemarthrosis. No fracture. Osseous suture anchors are present in the lateral humeral head and anterior humeral head consistent with prior rotator cuff surgery and biceps tenodesis. There is mild reactive edema surrounding the tunnels which is likely postsurgical. There is extensive extravasation of intra-articular contrast into the subacromial and subdeltoid space consistent with a recurrent  full-thickness rotator cuff tear. Rotator cuff: There is moderate thickening of the infraspinatus tendon with moderate insertional tendinosis and likely mild partial tear. There is a full-thickness tear of the supraspinatus tendon with retraction to the mid humeral head. This is best seen on sagittal image 12 through 14. There is moderate insertional tendinosis of subscapularis tendon with a mild partial tear. Teres minor tendon is intact. There is mild fatty atrophy of the supraspinatus muscle. Otherwise the rotator cuff muscles are not significantly atrophied. Mild atrophy of the deltoid muscle. Labrum and biceps tendon: There is blunting of the labrum without evidence of a discrete labral tear. Biceps tenodesis has been performed. The biceps tendon is not well seen in the bicipital groove. IMPRESSION: Extensive postsurgical changes. Mild edema is identified surrounding the osseous suture anchors which is likely reactive. 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 Insertional tendinosis of the infraspinatus tendon with a partial tear. Insertional tendinosis of the subscapularis tendon with a mild partial tear. Biceps tenodesis. Blunting of the labrum without discrete labral tear. Subacromial decompression changes with significant thinning of the acromion. Electronically signed by: Adrien Alberta MD 08/18/2023 11:39 AM EDT RP Workstation: [id number]                 ROS: All systems reviewed are negative as they relate to the chief complaint within the history of present illness.  Patient denies fevers or chills.  Assessment & Plan: Visit Diagnoses:  1. Status post rotator cuff repair     Plan: [name] Block  [name] is a 63 y.o. female who presents to the office for review of left shoulder MRI.  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  She had increased degenerative changes of the glenohumeral joint as noted on previous arthroscopy and  has had occasional glenohumeral injection since her surgery for her symptomatic shoulder arthritis.  She does have symptomatic weakness with external rotation and fairly consistent pain that bothers her with any prolonged activity, especially lifting her arm out in front of her or out to the side.  We discussed options available to patient and the main options would be reverse shoulder arthroplasty versus revision rotator cuff tear repair.  Patient wants the best option at 1 shoulder surgery for a functional and as close to pain-free shoulder as possible.  We discussed that for her situation, reverse shoulder replacement may be her best option.  We discussed this in detail today and she would like to proceed.  CT scan ordered for preoperative planning purposes.  Follow-up after CT scan to review results and further discuss surgery with Dr. Rozelle Corning.  Follow-Up Instructions: No follow-ups on file.   Orders:  No orders of the defined types were placed in this encounter.  No orders of the defined types were placed in this encounter.     Procedures: No procedures performed   Clinical Data: No additional findings.  Objective: Vital Signs: There were no vitals taken for this visit.  Physical Exam:  Constitutional: Patient appears well-developed HEENT:  Head: Normocephalic Eyes:EOM are normal Neck: Normal range of motion Cardiovascular: Normal rate Pulmonary/chest: Effort normal Neurologic: Patient is alert Skin: Skin is warm Psychiatric: Patient has normal mood and affect  Ortho Exam: Ortho exam demonstrates left shoulder with 30 degrees X rotation, 90 degrees abduction, 90 degrees forward elevation passively and actively.  Pain is limited due to patient's pain overhead.  She has axillary nerve intact with deltoid firing.  Incisions are well-healed from prior left shoulder surgery.  External rotation strength rated 4/5 of the left shoulder relative to 5/5 in the right shoulder.  Subscap  strength rated 5/5.  No Popeye deformity noted.  2+ radial pulse of the left upper extremity.  Intact EPL, FPL, finger abduction, pronation/deviation, bicep, tricep, deltoid.  Negative external rotation lag sign.  Specialty Comments:  MRI CERVICAL SPINE WITHOUT CONTRAST   TECHNIQUE: Multiplanar, multisequence MR imaging of the cervical spine was performed. No intravenous contrast was administered.   COMPARISON:  None Available.   FINDINGS: Alignment: No static listhesis. Loss of the normal cervical lordosis with straightening.   Vertebrae: No acute fracture, evidence of discitis, or aggressive bone lesion.   Cord: Normal signal and morphology.   Posterior Fossa, vertebral arteries, paraspinal tissues: Posterior fossa demonstrates no focal abnormality. Vertebral artery flow voids are maintained. Paraspinal soft tissues are unremarkable.   Disc levels:   Discs: Mild degenerative disease with disc height loss at C4-5.   C2-3: No significant disc bulge. No neural foraminal stenosis. No central canal stenosis.   C3-4: Tiny central disc protrusion. No foraminal or central canal stenosis.   C4-5: Mild broad-based disc bulge. Bilateral uncovertebral degenerative changes. Mild bilateral foraminal stenosis. No central canal stenosis.   C5-6: No significant disc bulge. No neural foraminal stenosis. No central canal stenosis.   C6-7: Broad-based disc bulge with a central disc protrusion contacting the ventral cervical spinal cord. No foraminal or central canal stenosis.   C7-T1: Broad-based disc bulge eccentric towards the right. Moderate right and mild left  foraminal stenosis. No spinal stenosis.   IMPRESSION: 1. At C4-5 there is a mild broad-based disc bulge. Bilateral uncovertebral degenerative changes. Mild bilateral foraminal stenosis. 2. At C6-7 there is a broad-based disc bulge with a central disc protrusion contacting the ventral cervical spinal cord. 3. At C7-T1  there is a broad-based disc bulge eccentric towards the right. Moderate right and mild left foraminal stenosis. 4. No acute osseous injury of the cervical spine.     Electronically Signed   By: Onnie Bilis M.D.   On: 07/28/2022 08:59  Imaging: No results found.   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   Chronic migraine w/o aura w/o status migrainosus, not intractable 08/27/2018   Central perforation of tympanic membrane of left ear 12/09/2016   Eustachian tube dysfunction, left 03/09/2016   Spondylosis of lumbar region without myelopathy or radiculopathy 10/08/2015   Liver fibrosis (HCC) 12/04/2014   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   Vitamin D  deficiency 06/29/2014   Secondary adrenal insufficiency (HCC) 06/29/2014   History of tympanostomy tube placement 02/07/2014   Hepatitis C virus infection cured after antiviral drug therapy 03/16/2012   Health care maintenance 12/15/2011   HTN (hypertension) 10/05/2010   Hyperlipidemia associated with type 2 diabetes mellitus (HCC) 10/23/2008   HIV disease (HCC) 05/09/2006   Allergic rhinitis 05/09/2006   GERD 05/09/2006   Past Medical History:  Diagnosis Date   Allergic rhinitis 05/09/2006   Allergy     Anxiety    Arthritis    Asthma    CAD (coronary artery disease) 03/19/2023   CCTA 03/16/23: CAC score 471 (98th percentile), RCA diffuse 25-49, dLM minimal Ca2+ plaque, LAD prox minimal plaques, pLCx < 24; TPV 542 mm3 (86th percentile)    CHF (congestive heart failure) (HCC)    Chronic back pain    Chronic night  sweats 08/23/2022   Diabetes mellitus without complication (HCC) 04/25/2014   GERD (gastroesophageal reflux disease)    Heart murmur    as a child   Hepatitis C    genotype 1b, 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 s/p 12 week course of simeprevir and sofosbuvir between October 2014 and January 2015 with resolution.   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   Pneumonia    Prediabetes    Prediabetes 02/18/2022   Refusal of blood transfusions as patient is Jehovah's Witness    Screening for cervical cancer 10/20/2022   Secondary adrenal insufficiency (HCC) 06/29/2014   Urticaria    Vaginal atrophy 10/20/2022    Family History  Problem Relation Age of Onset   Heart disease Mother    Diabetes Mother    Stroke Mother    Heart disease Father    Stroke Father    Diabetes Father    Hepatitis Sister        hcv   Asthma Sister    Allergic rhinitis Sister  Stroke Other    Colon polyps Brother    Renal Disease Brother    Cancer Sister        lung   Asthma Sister    Allergic rhinitis Sister    Cancer Maternal Aunt    Cancer Maternal Aunt    Colon cancer Neg Hx    Esophageal cancer Neg Hx    Stomach cancer Neg Hx    Rectal cancer Neg Hx    Angioedema Neg Hx    Eczema Neg Hx    Urticaria Neg Hx     Past Surgical History:  Procedure Laterality Date   BUNIONECTOMY     b/l   COLECTOMY     2003 for diverticulitis, had colostomy bag and then reversed   COLONOSCOPY     HAND SURGERY     HAND SURGERY Right    right thumb surgery Dr Donzella Galley 2021   NASAL SINUS SURGERY     SHOULDER ARTHROSCOPY WITH OPEN ROTATOR CUFF REPAIR AND DISTAL CLAVICLE ACROMINECTOMY Left 08/31/2021   Procedure: LEFT SHOULDER ARTHROSCOPY, DEBRIDEMENT,  ROTATOR CUFF TEAR REPAIR;  Surgeon: Jasmine Mesi, MD;  Location: [facility] OR;   Service: Orthopedics;  Laterality: Left;   SHOULDER SURGERY     left   TONSILLECTOMY     Social History   Occupational History   Occupation: Disabled  Tobacco Use   Smoking status: Former    Current packs/day: 0.00    Types: Cigarettes    Start date: 04/26/1987    Quit date: 04/26/2007    Years since quitting: 16.3   Smokeless tobacco: Never   Tobacco comments:    QUIT 2009  Vaping Use   Vaping status: Never Used  Substance and Sexual Activity   Alcohol use: No    Alcohol/week: 0.0 standard drinks of alcohol   Drug use: Yes    Types: Marijuana    Comment: cannabis in the past   Sexual activity: Not Currently    Partners: Male    Birth control/protection: Condom    Comment: declined condoms

## 2023-08-28 ENCOUNTER — Ambulatory Visit: Admitting: Physical Medicine and Rehabilitation

## 2023-08-28 ENCOUNTER — Other Ambulatory Visit: Payer: Self-pay

## 2023-08-28 ENCOUNTER — Other Ambulatory Visit: Payer: Self-pay | Admitting: Allergy and Immunology

## 2023-08-28 VITALS — BP 148/88 | HR 78

## 2023-08-28 DIAGNOSIS — M5412 Radiculopathy, cervical region: Secondary | ICD-10-CM | POA: Diagnosis not present

## 2023-08-28 MED ORDER — METHYLPREDNISOLONE ACETATE 40 MG/ML IJ SUSP
40.0000 mg | Freq: Once | INTRAMUSCULAR | Status: AC
  Administered 2023-08-28: 40 mg

## 2023-08-28 NOTE — Progress Notes (Signed)
 [name] [name] - 63 y.o. female MRN [id number]  Date of birth: [birth year]  Office Visit Note: Visit Date: 08/28/2023 PCP: Welshans Clunes, MD Referred by: Magnan Clunes  Subjective: Chief Complaint  Patient presents with   Neck - Pain   HPI:  [name] [name] is a 63 y.o. female who comes in today at the request of Prentis Brock, PA-C for planned Left C7-T1 Cervical Interlaminar epidural steroid injection with fluoroscopic guidance.  The patient has failed conservative care including home exercise, medications, time and activity modification.  This injection will be diagnostic and hopefully therapeutic.  Please see requesting physician notes for further details and justification.   ROS Otherwise per HPI.  Assessment & Plan: Visit Diagnoses:    ICD-10-CM   1. Cervical radiculopathy  M54.12 XR C-ARM NO REPORT    Epidural Steroid injection    methylPREDNISolone  acetate (DEPO-MEDROL ) injection 40 mg      Plan: No additional findings.   Meds & Orders:  Meds ordered this encounter  Medications   methylPREDNISolone  acetate (DEPO-MEDROL ) injection 40 mg    Orders Placed This Encounter  Procedures   XR C-ARM NO REPORT   Epidural Steroid injection    Follow-up: Return for visit to requesting provider as needed.   Procedures: No procedures performed  Cervical Epidural Steroid Injection - Interlaminar Approach with Fluoroscopic Guidance  Patient: [name] [name]      Date of Birth: August 21, 1960 MRN: [id number] PCP: Equihua Clunes, MD      Visit Date: 08/28/2023   Universal Protocol:    Date/Time: 05/05/252:40 PM  Consent Given By: the patient  Position: PRONE  Additional Comments: Vital signs were monitored before and after the procedure. Patient was prepped and draped in the usual sterile fashion. The correct patient, procedure, and site was verified.   Injection Procedure Details:   Procedure diagnoses: Cervical radiculopathy [M54.12]    Meds  Administered:  Meds ordered this encounter  Medications   methylPREDNISolone  acetate (DEPO-MEDROL ) injection 40 mg     Laterality: Left  Location/Site: C7-T1  Needle: 3.5 in., 20 ga. Tuohy  Needle Placement: Paramedian epidural space  Findings:  -Comments: Excellent flow of contrast into the epidural space. Initial placement had needle slide acroos midline due to positioning. This was corrected with needle and patient repositioning and injection proceeded as below.  Procedure Details: Using a paramedian approach from the side mentioned above, the region overlying the inferior lamina was localized under fluoroscopic visualization and the soft tissues overlying this structure were infiltrated with 4 ml. of 1% Lidocaine  without Epinephrine . A # 20 gauge, Tuohy needle was inserted into the epidural space using a paramedian approach.  The epidural space was localized using loss of resistance along with contralateral oblique bi-planar fluoroscopic views.  After negative aspirate for air, blood, and CSF, a 2 ml. volume of Isovue -250 was injected into the epidural space and the flow of contrast was observed. Radiographs were obtained for documentation purposes.   The injectate was administered into the level noted above.  Additional Comments:  No complications occurred Dressing: 2 x 2 sterile gauze and Band-Aid    Post-procedure details: Patient was observed during the procedure. Post-procedure instructions were reviewed.  Patient left the clinic in stable condition.   Clinical History: MRI CERVICAL SPINE WITHOUT CONTRAST   TECHNIQUE: Multiplanar, multisequence MR imaging of the cervical spine was performed. No intravenous contrast was administered.   COMPARISON:  None Available.   FINDINGS: Alignment: No static listhesis. Loss of the normal  cervical lordosis with straightening.   Vertebrae: No acute fracture, evidence of discitis, or aggressive bone lesion.   Cord: Normal  signal and morphology.   Posterior Fossa, vertebral arteries, paraspinal tissues: Posterior fossa demonstrates no focal abnormality. Vertebral artery flow voids are maintained. Paraspinal soft tissues are unremarkable.   Disc levels:   Discs: Mild degenerative disease with disc height loss at C4-5.   C2-3: No significant disc bulge. No neural foraminal stenosis. No central canal stenosis.   C3-4: Tiny central disc protrusion. No foraminal or central canal stenosis.   C4-5: Mild broad-based disc bulge. Bilateral uncovertebral degenerative changes. Mild bilateral foraminal stenosis. No central canal stenosis.   C5-6: No significant disc bulge. No neural foraminal stenosis. No central canal stenosis.   C6-7: Broad-based disc bulge with a central disc protrusion contacting the ventral cervical spinal cord. No foraminal or central canal stenosis.   C7-T1: Broad-based disc bulge eccentric towards the right. Moderate right and mild left foraminal stenosis. No spinal stenosis.   IMPRESSION: 1. At C4-5 there is a mild broad-based disc bulge. Bilateral uncovertebral degenerative changes. Mild bilateral foraminal stenosis. 2. At C6-7 there is a broad-based disc bulge with a central disc protrusion contacting the ventral cervical spinal cord. 3. At C7-T1 there is a broad-based disc bulge eccentric towards the right. Moderate right and mild left foraminal stenosis. 4. No acute osseous injury of the cervical spine.     Electronically Signed   By: Onnie Bilis M.D.   On: 07/28/2022 08:59     Objective:  VS:  HT:    WT:   BMI:     BP:(!) 148/88  HR:78bpm  TEMP: ( )  RESP:  Physical Exam Vitals and nursing note reviewed.  Constitutional:      General: She is not in acute distress.    Appearance: Normal appearance. She is not ill-appearing.  HENT:     Head: Normocephalic and atraumatic.     Right Ear: External ear normal.     Left Ear: External ear normal.  Eyes:      Extraocular Movements: Extraocular movements intact.  Cardiovascular:     Rate and Rhythm: Normal rate.     Pulses: Normal pulses.  Musculoskeletal:     Cervical back: Tenderness present. No rigidity.     Right lower leg: No edema.     Left lower leg: No edema.     Comments: Patient has good strength in the upper extremities including 5 out of 5 strength in wrist extension long finger flexion and APB.  There is no atrophy of the hands intrinsically.  There is a negative Hoffmann's test.   Lymphadenopathy:     Cervical: No cervical adenopathy.  Skin:    Findings: No erythema, lesion or rash.  Neurological:     General: No focal deficit present.     Mental Status: She is alert and oriented to person, place, and time.     Sensory: No sensory deficit.     Motor: No weakness or abnormal muscle tone.     Coordination: Coordination normal.  Psychiatric:        Mood and Affect: Mood normal.        Behavior: Behavior normal.      Imaging: No results found.

## 2023-08-28 NOTE — Procedures (Signed)
 Cervical Epidural Steroid Injection - Interlaminar Approach with Fluoroscopic Guidance  Patient: [name] [name]      Date of Birth: [birth year] MRN: [id number] PCP: Korzeniewski Clunes, MD      Visit Date: 08/28/2023   Universal Protocol:    Date/Time: 05/05/252:40 PM  Consent Given By: the patient  Position: PRONE  Additional Comments: Vital signs were monitored before and after the procedure. Patient was prepped and draped in the usual sterile fashion. The correct patient, procedure, and site was verified.   Injection Procedure Details:   Procedure diagnoses: Cervical radiculopathy [M54.12]    Meds Administered:  Meds ordered this encounter  Medications   methylPREDNISolone  acetate (DEPO-MEDROL ) injection 40 mg     Laterality: Left  Location/Site: C7-T1  Needle: 3.5 in., 20 ga. Tuohy  Needle Placement: Paramedian epidural space  Findings:  -Comments: Excellent flow of contrast into the epidural space. Initial placement had needle slide acroos midline due to positioning. This was corrected with needle and patient repositioning and injection proceeded as below.  Procedure Details: Using a paramedian approach from the side mentioned above, the region overlying the inferior lamina was localized under fluoroscopic visualization and the soft tissues overlying this structure were infiltrated with 4 ml. of 1% Lidocaine  without Epinephrine . A # 20 gauge, Tuohy needle was inserted into the epidural space using a paramedian approach.  The epidural space was localized using loss of resistance along with contralateral oblique bi-planar fluoroscopic views.  After negative aspirate for air, blood, and CSF, a 2 ml. volume of Isovue -250 was injected into the epidural space and the flow of contrast was observed. Radiographs were obtained for documentation purposes.   The injectate was administered into the level noted above.  Additional Comments:  No complications occurred Dressing:  2 x 2 sterile gauze and Band-Aid    Post-procedure details: Patient was observed during the procedure. Post-procedure instructions were reviewed.  Patient left the clinic in stable condition.

## 2023-08-28 NOTE — Patient Instructions (Signed)

## 2023-08-28 NOTE — Progress Notes (Signed)
 Pain Scale   Average Pain 10 Patient advising her neck pain radiates in her bilateral shoulders        +Driver, -BT, -Dye Allergies.

## 2023-08-29 ENCOUNTER — Ambulatory Visit (INDEPENDENT_AMBULATORY_CARE_PROVIDER_SITE_OTHER): Payer: Medicaid Other | Admitting: Allergy and Immunology

## 2023-08-29 ENCOUNTER — Other Ambulatory Visit: Payer: Self-pay | Admitting: Infectious Diseases

## 2023-08-29 VITALS — BP 118/68 | HR 86 | Temp 98.7°F | Resp 14 | Ht 60.43 in | Wt 174.7 lb

## 2023-08-29 DIAGNOSIS — K219 Gastro-esophageal reflux disease without esophagitis: Secondary | ICD-10-CM | POA: Diagnosis not present

## 2023-08-29 DIAGNOSIS — J3089 Other allergic rhinitis: Secondary | ICD-10-CM | POA: Diagnosis not present

## 2023-08-29 DIAGNOSIS — J4540 Moderate persistent asthma, uncomplicated: Secondary | ICD-10-CM

## 2023-08-29 DIAGNOSIS — Z5181 Encounter for therapeutic drug level monitoring: Secondary | ICD-10-CM

## 2023-08-29 DIAGNOSIS — L299 Pruritus, unspecified: Secondary | ICD-10-CM

## 2023-08-29 MED ORDER — MONTELUKAST SODIUM 10 MG PO TABS: 10.0000 mg | ORAL_TABLET | Freq: Every evening | ORAL | 1 refills | Status: DC

## 2023-08-29 MED ORDER — TRIAMCINOLONE ACETONIDE 55 MCG/ACT NA AERO: 1.0000 | INHALATION_SPRAY | Freq: Two times a day (BID) | NASAL | 1 refills | Status: AC

## 2023-08-29 MED ORDER — CETIRIZINE HCL 10 MG PO TABS: 20.0000 mg | ORAL_TABLET | Freq: Two times a day (BID) | ORAL | 1 refills | Status: DC

## 2023-08-29 MED ORDER — SYMBICORT 160-4.5 MCG/ACT IN AERO: 2.0000 | INHALATION_SPRAY | Freq: Two times a day (BID) | RESPIRATORY_TRACT | 1 refills | Status: DC

## 2023-08-29 MED ORDER — GABAPENTIN 400 MG PO CAPS: 400.0000 mg | ORAL_CAPSULE | Freq: Every evening | ORAL | 1 refills | Status: DC

## 2023-08-29 MED ORDER — NEURONTIN 100 MG PO CAPS: 200.0000 mg | ORAL_CAPSULE | Freq: Two times a day (BID) | ORAL | 1 refills | Status: DC

## 2023-08-29 MED ORDER — ALBUTEROL SULFATE HFA 108 (90 BASE) MCG/ACT IN AERS: 2.0000 | INHALATION_SPRAY | RESPIRATORY_TRACT | 1 refills | Status: DC | PRN

## 2023-08-29 NOTE — Patient Instructions (Signed)
  1.  Continue to Treat and prevent inflammation:   A.  Montelukast  10 mg - 1 tablet 1 time per day  B.  Symbicort  160 - 2 inhalations 2 times per day  C.  Nasacort  -1 spray each nostril 1-2 times per day  D.  START Tezepelumab injections monthly - insurance approval required.   2.  Continue to Treat and prevent reflux:   A.  Protonix  40 mg 2 times per day  B.  Famotidine  40 mg in p.m.    3.  Continue to treat and prevent itchiness:    A. cetirizine  10 mg -1-2 tablets 1-2 times a day   B. Gabapentin  200 mg - 200 / 200 / 400  4.  If needed:   A.  OTC Benadryl   B.  OTC nasal saline  C.  Pro Air HFA 2 puffs every 4-6 hours  D.  Nasal saline  5. Return to clinic in 6 months or earlier if problem  6. Influenza = Tamifu. Covid = Paxlovid

## 2023-08-29 NOTE — Progress Notes (Unsigned)
 [place] - [place] - [place] - [place] - [place]   Follow-up Note  Referring Provider: Rack [name] Primary Provider: Brindle Clunes, MD Date of Office Visit: 08/29/2023  Subjective:   [name] [name] (DOB: 04/21/1961) is a 63 y.o. female who [facility] in re-evaluation of the following:  HPI: [name] [name] returns to this clinic in evaluation of asthma, allergic rhinitis, LPR, pruritic disorder.  I last saw her in this clinic 28 February 2023.  Her asthma has been active the spring.  Even in the face of using Symbicort  and montelukast  on a consistent basis she has a requirement for short acting bronchodilator about 3 times per week for coughing and congestion in her chest.  Her nose is doing pretty well while using Nasacort  mostly 1 spray each nostril once a day along with antihistamines.  She is currently taking cetirizine  20 mg a day.  Her reflux is under good control while using Protonix  and famotidine  in combination.  Currently her Protonix  is twice a day.  Her pruritic disorder is under very good control with using a total of 800 mg of gabapentin  divided at 200 mg / 200 mg / 400 mg throughout the day.  Allergies as of 08/29/2023       Reactions   Acetaminophen  Other (See Comments)   Inflamed liver, hospitalized Other Reaction(s): liver demage   Morphine  Sulfate Hives, Shortness Of Breath   Triamterene Hives   Aspirin-caffeine Diarrhea, Other (See Comments)   liver damage; upset stomach Other Reaction(s): liver demage   Dyazide [hydrochlorothiazide -triamterene] Hives   Empagliflozin Diarrhea   (Jardiance) stomach ache   Metformin  Hcl Er Other (See Comments), Nausea Only   upset stomach   Topamax  [topiramate ] Other (See Comments)   Vision disturbances.   Citalopram  Itching, Rash, Other (See Comments)   Emtricitabine -tenofovir  Df Rash   Descovy    Lisinopril  Other (See Comments)   Cough    Losartan  Nausea  Only, Rash   Pt had rash, worsening dizziness, and nausea after starting losartan , which improved after stopping losartan    Triamterene-hctz Rash        Medication List    Accu-Chek Guide test strip Generic drug: glucose blood use to check blood sugar In Vitro Once a day DX: E11.9 for 90 days   amLODipine  10 MG tablet Commonly known as: NORVASC  Take 1 tablet (10 mg total) by mouth daily.   atorvastatin  10 MG tablet Commonly known as: LIPITOR Take 0.5 tablets (5 mg total) by mouth daily.   betamethasone  valerate ointment 0.1 % Commonly known as: VALISONE  Apply 1 Application topically 2 (two) times daily.   Cabenuva  600 & 900 MG/3ML injection Generic drug: cabotegravir  & rilpivirine  ER Inject 1 kit into the muscle every 8 (eight) weeks.   cetirizine  10 MG tablet Commonly known as: ZYRTEC  TAKE TWO TABLETS BY MOUTH TWICE A DAY   cholecalciferol  25 MCG (1000 UNIT) tablet Commonly known as: VITAMIN D3 Take 3 tablets (3,000 Units total) by mouth daily.   ciprofloxacin -dexamethasone  OTIC suspension Commonly known as: CIPRODEX  Place 4 drops into the left ear as needed (ear infection).   diclofenac  50 MG EC tablet Commonly known as: VOLTAREN  TAKE 1 TABLET (50 MG TOTAL) BY MOUTH TWO (TWO) TIMES DAILY   ezetimibe  10 MG tablet Commonly known as: ZETIA  Take 1 tablet (10 mg total) by mouth daily.   famotidine  40 MG tablet Commonly known as: PEPCID  Take 1 tablet (40 mg total) by mouth at bedtime.  Global Ease Inject Pen Needles 32G X 4 MM Misc Generic drug: Insulin  Pen Needle USE AS DIRECTED WITH VICTOZA for 30   hydrochlorothiazide  25 MG tablet Commonly known as: HYDRODIURIL  Take 1 tablet (25 mg total) by mouth daily.   hydrocortisone  5 MG tablet Commonly known as: CORTEF  Take 2.5-5 mg by mouth See admin instructions. Take 1 tablet (5 mg) by mouth in the morning (scheduled) & may taken an additional 0.5 tablet (2.5 mg) by mouth in the evening if needed.    liraglutide 18 MG/3ML Sopn Commonly known as: VICTOZA Inject 1.8 mg into the skin every evening.   metoprolol  tartrate 50 MG tablet Commonly known as: LOPRESSOR  Take 1 tablet (50 mg total) by mouth 2 (two) times daily.   montelukast  10 MG tablet Commonly known as: SINGULAIR  Take 1 tablet (10 mg total) by mouth at bedtime.   Neurontin  100 MG capsule Generic drug: gabapentin  TAKE 1-2 CAPSULES EVERY 3-4 TIMES DAILY   nitroGLYCERIN  0.4 MG SL tablet Commonly known as: NITROSTAT  Place 1 tablet (0.4 mg total) under the tongue every 5 (five) minutes as needed for chest pain.   nortriptyline  10 MG capsule Commonly known as: PAMELOR  TAKE TWO CAPSULES BY MOUTH AT BEDTIME   ondansetron  4 MG tablet Commonly known as: Zofran  Take 1 tablet (4 mg total) by mouth every 8 (eight) hours as needed for nausea or vomiting.   pantoprazole  40 MG tablet Commonly known as: PROTONIX  Take 1 tablet (40 mg total) by mouth 2 (two) times daily.   potassium chloride  SA 20 MEQ tablet Commonly known as: KLOR-CON  M Take 2 tablets (40 mEq total) by mouth daily.   psyllium 95 % Pack Commonly known as: HYDROCIL/METAMUCIL Take 1 packet by mouth daily.   sertraline  100 MG tablet Commonly known as: ZOLOFT  Take 1 tablet (100 mg total) by mouth daily.   sodium chloride  0.65 % Soln nasal spray Commonly known as: OCEAN PLACE 1 SPRAY INTO BOTH NOSTRILS AS NEEDED FOR CONGESTION.   SUMAtriptan  25 MG tablet Commonly known as: Imitrex  Take 1 tablet (25 mg total) by mouth every 2 (two) hours as needed for migraine. May repeat in 2 hours if headache persists or recurs.   Symbicort  160-4.5 MCG/ACT inhaler Generic drug: budesonide -formoterol INHALE TWO PUFFS INTO THE LUNGS TWO (TWO) TIMES DAILY. INHALE TWO PUFFS INTO THE LUNGS IN THE MORNING AND AT BEDTIME   triamcinolone  55 MCG/ACT Aero nasal inhaler Commonly known as: NASACORT  Place 1 spray into the nose 2 (two) times daily. 1 spray each nostril 2 times per  day   Ventolin  HFA 108 (90 Base) MCG/ACT inhaler Generic drug: albuterol  INHALE TWO PUFFS INTO THE LUNGS EVERY 4 (FOUR) HOURS AS NEEDED FOR WHEEZING OR SHORTNESS OF BREATH   Veozah  45 MG Tabs Generic drug: Fezolinetant  Take 1 tablet (45 mg total) by mouth daily.    Past Medical History:  Diagnosis Date   Allergic rhinitis 05/09/2006   Allergy     Anxiety    Arthritis    Asthma    CAD (coronary artery disease) 03/19/2023   CCTA 03/16/23: CAC score 471 (98th percentile), RCA diffuse 25-49, dLM minimal Ca2+ plaque, LAD prox minimal plaques, pLCx < 24; TPV 542 mm3 (86th percentile)    CHF (congestive heart failure) (HCC)    Chronic back pain    Chronic night sweats 08/23/2022   Diabetes mellitus without complication (HCC) 04/25/2014   GERD (gastroesophageal reflux disease)    Heart murmur    as a child   Hepatitis  C    genotype 1b, 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 s/p 12 week course of simeprevir and sofosbuvir between October 2014 and January 2015 with resolution.   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   Pneumonia    Prediabetes    Prediabetes 02/18/2022   Refusal of blood transfusions as patient is Jehovah's Witness    Screening for cervical cancer 10/20/2022   Secondary adrenal insufficiency (HCC) 06/29/2014   Urticaria    Vaginal atrophy 10/20/2022    Past Surgical History:  Procedure Laterality Date   BUNIONECTOMY     b/l   COLECTOMY     2003 for diverticulitis, had colostomy bag and then reversed   COLONOSCOPY     HAND SURGERY     HAND SURGERY Right    right thumb surgery Dr Donzella Galley 2021   NASAL SINUS SURGERY     SHOULDER ARTHROSCOPY WITH OPEN ROTATOR CUFF REPAIR AND DISTAL CLAVICLE ACROMINECTOMY Left 08/31/2021   Procedure: LEFT SHOULDER ARTHROSCOPY, DEBRIDEMENT,  ROTATOR CUFF TEAR  REPAIR;  Surgeon: Jasmine Mesi, MD;  Location: [facility] OR;  Service: Orthopedics;  Laterality: Left;   SHOULDER SURGERY     left   TONSILLECTOMY      Review of systems negative except as noted in HPI / PMHx or noted below:  Review of Systems  Constitutional: Negative.   HENT: Negative.    Eyes: Negative.   Respiratory: Negative.    Cardiovascular: Negative.   Gastrointestinal: Negative.   Genitourinary: Negative.   Musculoskeletal: Negative.   Skin: Negative.   Neurological: Negative.   Endo/Heme/Allergies: Negative.   Psychiatric/Behavioral: Negative.       Objective:   Vitals:   08/29/23 1047  BP: 118/68  Pulse: 86  Resp: 14  Temp: 98.7 F (37.1 C)  SpO2: 97%   Height: 5' 0.43" (153.5 cm)  Weight: 174 lb 11.2 oz (79.2 kg)   Physical Exam Constitutional:      Appearance: She is not diaphoretic.  HENT:     Head: Normocephalic.     Right Ear: External ear normal.     Left Ear: External ear normal.     Nose: Nose normal. No mucosal edema or rhinorrhea.     Mouth/Throat:     Pharynx: Uvula midline. No oropharyngeal exudate.  Eyes:     Conjunctiva/sclera: Conjunctivae normal.  Neck:     Thyroid : No thyromegaly.     Trachea: Trachea normal. No tracheal tenderness or tracheal deviation.  Cardiovascular:     Rate and Rhythm: Normal rate and regular rhythm.     Heart sounds: Normal heart sounds, S1 normal and S2 normal. No murmur heard. Pulmonary:     Effort: No respiratory distress.     Breath sounds: Normal breath sounds. No stridor. No wheezing or rales.  Lymphadenopathy:     Head:     Right side of head: No tonsillar adenopathy.     Left side of head: No tonsillar adenopathy.     Cervical: No cervical adenopathy.  Skin:    Findings: No erythema or rash.     Nails: There is no clubbing.  Neurological:     Mental Status: She is alert.     Diagnostics: Spirometry was performed and demonstrated an FEV1 of 1.55  at 84 % of predicted.  She used her short  acting bronchodilator earlier today.  Assessment and Plan:   1. Not well controlled moderate persistent asthma   2. Other allergic rhinitis   3. LPRD (laryngopharyngeal reflux disease)   4. Pruritic disorder    1.  Continue to Treat and prevent inflammation:   A.  Montelukast  10 mg - 1 tablet 1 time per day  B.  Symbicort  160 - 2 inhalations 2 times per day  C.  Nasacort  -1 spray each nostril 1-2 times per day  D.  START Tezepelumab injections monthly - insurance approval required.   2.  Continue to Treat and prevent reflux:   A.  Protonix  40 mg 2 times per day  B.  Famotidine  40 mg in p.m.    3.  Continue to treat and prevent itchiness:    A. cetirizine  10 mg -1-2 tablets 1-2 times a day   B. Gabapentin  200 mg - 200 / 200 / 400  4.  If needed:   A.  OTC Benadryl   B.  OTC nasal saline  C.  Pro Air HFA 2 puffs every 4-6 hours  D.  Nasal saline  5. Return to clinic in 6 months or earlier if problem  6. Influenza = Tamifu. Covid = Paxlovid  Ebonie has lost control of her asthma this spring and we will start her on tezepelumab in addition to continuing her on anti-inflammatory agents for her airway as noted above.  And her reflux actually appears to be doing very well on her current plan and she can remain on Protonix  and famotidine  and her pruritic disorder appears to be under very good control while using gabapentin  and cetirizine .  I will see her back in this clinic in 6 months or earlier if there is a problem.  Schuyler Custard, MD Allergy  / Immunology [facility]

## 2023-08-30 ENCOUNTER — Encounter: Payer: Self-pay | Admitting: Allergy and Immunology

## 2023-09-01 ENCOUNTER — Encounter: Payer: Self-pay | Admitting: Surgical

## 2023-09-01 ENCOUNTER — Other Ambulatory Visit: Payer: Self-pay

## 2023-09-01 ENCOUNTER — Telehealth: Payer: Self-pay

## 2023-09-01 DIAGNOSIS — M19012 Primary osteoarthritis, left shoulder: Secondary | ICD-10-CM

## 2023-09-01 NOTE — Telephone Encounter (Signed)
-----   Message from [facility] sent at 09/01/2023 11:35 AM EDT ----- [name] [name], can you order left shoulder thin cut CT scan for preoperative planning purposes?  She will need follow-up with Dr. Rozelle Corning to discuss surgery.

## 2023-09-01 NOTE — Telephone Encounter (Signed)
 Scan ordered

## 2023-09-04 ENCOUNTER — Telehealth: Payer: Self-pay | Admitting: Surgical

## 2023-09-04 DIAGNOSIS — D352 Benign neoplasm of pituitary gland: Secondary | ICD-10-CM | POA: Diagnosis not present

## 2023-09-04 DIAGNOSIS — I10 Essential (primary) hypertension: Secondary | ICD-10-CM | POA: Diagnosis not present

## 2023-09-04 DIAGNOSIS — E1140 Type 2 diabetes mellitus with diabetic neuropathy, unspecified: Secondary | ICD-10-CM | POA: Diagnosis not present

## 2023-09-04 DIAGNOSIS — M810 Age-related osteoporosis without current pathological fracture: Secondary | ICD-10-CM | POA: Diagnosis not present

## 2023-09-04 DIAGNOSIS — E2749 Other adrenocortical insufficiency: Secondary | ICD-10-CM | POA: Diagnosis not present

## 2023-09-04 NOTE — Telephone Encounter (Signed)
 Pt request Rx for valium , has appt for CT/MRI on 09/15/23 and will need.    [employer]

## 2023-09-06 ENCOUNTER — Telehealth: Payer: Self-pay | Admitting: *Deleted

## 2023-09-06 ENCOUNTER — Other Ambulatory Visit (HOSPITAL_COMMUNITY): Payer: Self-pay

## 2023-09-06 MED ORDER — TEZSPIRE 210 MG/1.91ML ~~LOC~~ SOAJ
210.0000 mg | SUBCUTANEOUS | 11 refills | Status: AC
  Filled 2023-09-07: qty 1.91, 28d supply, fill #0
  Filled 2023-10-19: qty 1.91, 28d supply, fill #1
  Filled 2023-11-20: qty 1.91, 28d supply, fill #2
  Filled 2023-12-18 – 2023-12-22 (×2): qty 1.91, 28d supply, fill #3
  Filled 2024-01-12: qty 1.91, 28d supply, fill #4
  Filled 2024-02-15: qty 1.91, 28d supply, fill #5
  Filled 2024-03-13: qty 1.91, 28d supply, fill #6
  Filled 2024-04-05 – 2024-04-08 (×2): qty 1.91, 28d supply, fill #7
  Filled 2024-05-01: qty 1.91, 28d supply, fill #8
  Filled 2024-05-29: qty 1.91, 28d supply, fill #9

## 2023-09-06 NOTE — Telephone Encounter (Signed)
-----   Message from ERIC J KOZLOW sent at 08/30/2023  6:49 AM EDT ----- TEZE

## 2023-09-06 NOTE — Telephone Encounter (Signed)
 T/c  to patient advised approval and submit to [facility] with 1st dose in clinic then can home admin. Delivery, storage and dosing instructions advised to patient

## 2023-09-07 ENCOUNTER — Other Ambulatory Visit: Payer: Self-pay

## 2023-09-07 ENCOUNTER — Other Ambulatory Visit (HOSPITAL_COMMUNITY): Payer: Self-pay

## 2023-09-07 NOTE — Progress Notes (Signed)
 The 10-year ASCVD risk score (Arnett DK, et al., 2019) is: 21.6%   Values used to calculate the score:     Age: 63 years     Sex: Female     Is Non-Hispanic African American: Yes     Diabetic: Yes     Tobacco smoker: No     Systolic Blood Pressure: 148 mmHg     Is BP treated: Yes     HDL Cholesterol: 42 mg/dL     Total Cholesterol: 153 mg/dL  Currently prescribed atorvastatin  10 mg.   Daylen Hack, BSN, RN

## 2023-09-07 NOTE — Progress Notes (Signed)
 Specialty Pharmacy Initial Fill Coordination Note  [name] [name] is a 63 y.o. female contacted today regarding initial fill of specialty medication(s) Tezepelumab-ekko [name])   Patient requested Courier to Provider Office   Delivery date: 09/12/23   Verified address: [address] [place] [address]   Medication will be filled on 05/19.   Patient is aware of $4.00 copayment.

## 2023-09-07 NOTE — Progress Notes (Signed)
 Specialty Pharmacy Initiation Note   [name] [name] is a 63 y.o. female who will be followed by the specialty pharmacy service for RxSp Asthma/COPD    Review of administration, indication, effectiveness, safety, potential side effects, storage/disposable, and missed dose instructions occurred today for patient's specialty medication(s) Tezepelumab-ekko [name])     Patient/Caregiver did not have any additional questions or concerns.   Patient's therapy is appropriate to: Initiate    Goals Addressed             This Visit's Progress    Reduce disease symptoms including coughing and shortness of breath       Patient is initiating therapy. Patient will maintain adherence         Rena Carnes Specialty Pharmacist

## 2023-09-08 ENCOUNTER — Other Ambulatory Visit: Payer: Self-pay | Admitting: Surgical

## 2023-09-08 MED ORDER — DIAZEPAM 5 MG PO TABS: 5.0000 mg | ORAL_TABLET | Freq: Once | ORAL | 0 refills | Status: AC

## 2023-09-08 NOTE — Telephone Encounter (Signed)
 Sent in

## 2023-09-11 ENCOUNTER — Other Ambulatory Visit: Payer: Self-pay

## 2023-09-11 ENCOUNTER — Other Ambulatory Visit (HOSPITAL_COMMUNITY): Payer: Self-pay

## 2023-09-13 ENCOUNTER — Other Ambulatory Visit

## 2023-09-15 ENCOUNTER — Ambulatory Visit
Admission: RE | Admit: 2023-09-15 | Discharge: 2023-09-15 | Disposition: A | Discharge status: Home / Self Care | Source: Ambulatory Visit | Attending: Surgical | Admitting: Surgical

## 2023-09-15 DIAGNOSIS — G8929 Other chronic pain: Secondary | ICD-10-CM | POA: Diagnosis not present

## 2023-09-15 DIAGNOSIS — M25512 Pain in left shoulder: Secondary | ICD-10-CM | POA: Diagnosis not present

## 2023-09-15 DIAGNOSIS — Z9889 Other specified postprocedural states: Secondary | ICD-10-CM | POA: Diagnosis not present

## 2023-09-15 DIAGNOSIS — M19012 Primary osteoarthritis, left shoulder: Secondary | ICD-10-CM | POA: Diagnosis not present

## 2023-09-22 ENCOUNTER — Other Ambulatory Visit: Payer: Self-pay

## 2023-09-22 NOTE — Patient Instructions (Signed)
 Visit Information  [name] [name] was given information about Medicaid Managed Care team care coordination services as a part of their [facility] Community Plan Medicaid benefit. [name] verbally consented to engagement with the [facility] Managed Care team.   If you are experiencing a medical emergency, please call 911 or report to your local emergency department or urgent care.   If you have a non-emergency medical problem during routine business hours, please contact your provider's office and ask to speak with a nurse.   For questions related to your [facility], please call: [phone] or visit the homepage here: [email or web address]  If you would like to schedule transportation through your [facility], please call the following number at least 2 days in advance of your appointment: [phone]   Rides for urgent appointments can also be made after hours by calling Member Services.  Call the Behavioral Health Crisis Line at [phone], at any time, 24 hours a day, 7 days a week. If you are in danger or need immediate medical attention call 911.  If you would like help to quit smoking, call 1-800-QUIT-NOW (705-179-5861) OR Espaol: 1-855-Djelo-Ya (4-034-742-5956) o para ms informacin haga clic aqu or Text READY to 387-564 to register via text  Ms. Hallahan - following are the goals we discussed in your visit today:   Goals Addressed             This Visit's Progress    VBCI RN Care Plan       Problems:  Chronic Disease Management support and education needs related to HTN  Goal: Over the next 30 days the Patient will attend all scheduled medical appointments: with PCP and specialist as evidenced by keeping al scheduled appointments        demonstrate Ongoing adherence to prescribed treatment plan for HTN as evidenced by no admissions to the hospital verbalize  basic understanding of HTN disease process and self health management plan as evidenced by verbal explanation lifestyle changes and consistent medication compliance   Interventions:   Hypertension Interventions: Last practice recorded BP readings:  BP Readings from Last 3 Encounters:  08/29/23 118/68  08/28/23 (!) 148/88  08/24/23 132/82   Most recent eGFR/CrCl:  Lab Results  Component Value Date   EGFR 76 08/14/2023    No components found for: "CRCL"  Evaluation of current treatment plan related to hypertension self management and patient's adherence to plan as established by provider Provided education to patient re: stroke prevention, s/s of heart attack and stroke Reviewed medications with patient and discussed importance of compliance Discussed plans with patient for ongoing care management follow up and provided patient with direct contact information for care management team Advised patient, providing education and rationale, to monitor blood pressure daily and record, calling PCP for findings outside established parameters Reviewed scheduled/upcoming provider appointments including:  Discussed complications of poorly controlled blood pressure such as heart disease, stroke, circulatory complications, vision complications, kidney impairment, sexual dysfunction Screening for signs and symptoms of depression related to chronic disease state  Assessed social determinant of health barriers Check BP once daily and write the values  Patient Self-Care Activities:  Attend all scheduled provider appointments Call pharmacy for medication refills 3-7 days in advance of running out of medications Call provider office for new concerns or questions  Take medications as prescribed    Plan:  Telephone follow up appointment with care management team member scheduled for:  10/25/23  2 pm  Please see education materials related to HTN provided by MyChart link.  Patient  verbalizes understanding of instructions and care plan provided today and agrees to view in MyChart. Active MyChart status and patient understanding of how to access instructions and care plan via MyChart confirmed with patient.     Telephone follow up appointment with care management team member scheduled for:  10/25/23  2 pm      Augustin Leber RN, BSN, [facility]  [facility], [facility]  Care Coordinator Phone: [phone]      Following is a copy of your plan of care:  There are no care plans that you recently modified to display for this patient.

## 2023-09-22 NOTE — Patient Outreach (Signed)
 Complex Care Management   Visit Note  09/22/2023  Name:  [name] [name] MRN: [id number] DOB: 1960/07/13  Situation: Referral received for Complex Care Management related to HTN I obtained verbal consent from Patient.  Visit completed with patient  on the phone  Background:   Past Medical History:  Diagnosis Date   Allergic rhinitis 05/09/2006   Allergy     Anxiety    Arthritis    Asthma    CAD (coronary artery disease) 03/19/2023   CCTA 03/16/23: CAC score 471 (98th percentile), RCA diffuse 25-49, dLM minimal Ca2+ plaque, LAD prox minimal plaques, pLCx < 24; TPV 542 mm3 (86th percentile)    CHF (congestive heart failure) (HCC)    Chronic back pain    Chronic night sweats 08/23/2022   Diabetes mellitus without complication (HCC) 04/25/2014   GERD (gastroesophageal reflux disease)    Heart murmur    as a child   Hepatitis C    genotype 1b, 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 s/p 12 week course of simeprevir and sofosbuvir between October 2014 and January 2015 with resolution.   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   Pneumonia    Prediabetes    Prediabetes 02/18/2022   Refusal of blood transfusions as patient is Jehovah's Witness    Screening for cervical cancer 10/20/2022   Secondary adrenal insufficiency (HCC) 06/29/2014   Urticaria    Vaginal atrophy 10/20/2022    Assessment: Patient Reported Symptoms:  Cognitive Cognitive Status: Able to follow simple commands, Alert and oriented to person, place, and time, Normal speech and language skills      Neurological Neurological Review of Symptoms: Headaches Neurological Conditions: Headache (Migraines) Neurological Management Strategies: Medication therapy  HEENT HEENT Symptoms Reported: Change or loss of hearing HEENT Conditions: Ear problem(s)  (left ear wears hearing aid) HEENT Management Strategies: Medical device Ear problem(s) (left ear wears hearing aid)  Cardiovascular Cardiovascular Symptoms Reported: Chest pain or discomfort (if she exerts herself) Does patient have uncontrolled Hypertension?: Yes Is patient checking Blood Pressure at home?: Yes Patient's Recent BP reading at home: 140/80 on 09/14/23 Cardiovascular Conditions: Hypertension, High blood cholesterol Cardiovascular Management Strategies: Medical device, Medication therapy, Diet modification, Exercise  Respiratory Respiratory Symptoms Reported: Shortness of breath Respiratory Conditions: Seasonal allergies  Endocrine Is patient diabetic?: Yes Is patient checking blood sugars at home?: Yes Endocrine Conditions: Diabetes Endocrine Management Strategies: Medication therapy, Medical device  Gastrointestinal Gastrointestinal Symptoms Reported: No symptoms reported      Genitourinary Genitourinary Symptoms Reported: No symptoms reported    Integumentary   Skin Conditions: Eczema Skin Management Strategies: Medication therapy  Musculoskeletal Musculoskelatal Symptoms Reviewed: Weakness, Unsteady gait, Difficulty walking Musculoskeletal Conditions: Back pain, Joint pain Musculoskeletal Management Strategies: Medical device, Medication therapy, Exercise      Psychosocial       Quality of Family Relationships: supportive, involved Do you feel physically threatened by others?: No      09/22/2023    1:53 PM  Depression screen PHQ 2/9  Decreased Interest 0  Down, Depressed, Hopeless 0  PHQ - 2 Score 0    There were no vitals filed for this visit.  Medications Reviewed Today     Reviewed by Augustin Leber, RN (Registered Nurse) on 09/22/23 at 1336  Med  List Status: <None>   Medication Order Taking? Sig Documenting Provider Last Dose Status Informant  ACCU-CHEK GUIDE test strip [id number] Yes use to check blood sugar In Vitro Once a day DX: E11.9 for 90  days [provider] Taking Active   albuterol  (VENTOLIN  HFA) 108 (90 Base) MCG/ACT inhaler [id number] Yes Inhale 2 puffs into the lungs every 4 (four) hours as needed for wheezing or shortness of breath. Kozlow, Rema Care, MD Taking Active   amLODipine  (NORVASC ) 10 MG tablet [id number] Yes Take 1 tablet (10 mg total) by mouth daily. Debose Clunes, MD Taking Active   atorvastatin  (LIPITOR) 10 MG tablet [id number] Yes Take 0.5 tablets (5 mg total) by mouth daily. Gomez-Caraballo, Maria, MD Taking Active   betamethasone  valerate ointment (VALISONE ) 0.1 % [id number] Yes Apply 1 Application topically 2 (two) times daily. Orson Blalock, NP Taking Active   cabotegravir  & rilpivirine  ER (CABENUVA ) 600 & 900 MG/3ML injection [id number] Yes Inject 1 kit into the muscle every 8 (eight) weeks. Orson Blalock, NP Taking Active   cetirizine  (ZYRTEC ) 10 MG tablet [id number] Yes Take 2 tablets (20 mg total) by mouth 2 (two) times daily. Kozlow, Rema Care, MD Taking Active   ciprofloxacin -dexamethasone  (CIPRODEX ) OTIC suspension [id number] Yes Place 4 drops into the left ear as needed (ear infection). [provider] Taking Active   diclofenac  (VOLTAREN ) 50 MG EC tablet [id number] Yes TAKE 1 TABLET (50 MG TOTAL) BY MOUTH TWO (TWO) TIMES DAILY Magnant, Charles L, PA-C Taking Active   ezetimibe  (ZETIA ) 10 MG tablet [id number] Yes Take 1 tablet (10 mg total) by mouth daily. Gomez-Caraballo, Maria, MD Taking Active   famotidine  (PEPCID ) 40 MG tablet [id number] Yes Take 1 tablet (40 mg total) by mouth at bedtime. Sedeno Clunes, MD Taking Active   Fezolinetant  (VEOZAH ) 45 MG TABS [id number] Yes Take 1 tablet (45 mg total) by mouth daily. Orson Blalock, NP Taking Active   gabapentin  (NEURONTIN ) 400 MG capsule [id number] Yes Take 1 capsule (400 mg total) by mouth at bedtime. Kozlow, Rema Care, MD Taking Active   GLOBAL EASE INJECT PEN NEEDLES 32G X 4 MM MISC [id number] Yes USE AS DIRECTED WITH  VICTOZA for 30 [provider] Taking Active   hydrochlorothiazide  (HYDRODIURIL ) 25 MG tablet [id number] Yes Take 1 tablet (25 mg total) by mouth daily. Brilliant Clunes, MD Taking Active   hydrocortisone  (CORTEF ) 5 MG tablet [id number] Yes Take 2.5-5 mg by mouth See admin instructions. Take 1 tablet (5 mg) by mouth in the morning (scheduled) & may taken an additional 0.5 tablet (2.5 mg) by mouth in the evening if needed. [provider] Taking Active Self           Med Note Winnie Haver Feb 27, 2023  3:23 PM)    liraglutide (VICTOZA) 18 MG/3ML SOPN [id number] Yes Inject 1.8 mg into the skin every evening. [provider] Taking Active Self  metoprolol  tartrate (LOPRESSOR ) 50 MG tablet [id number] Yes Take 1 tablet (50 mg total) by mouth 2 (two) times daily. Espe Clunes, MD Taking Active   montelukast  (SINGULAIR ) 10 MG tablet [id number] Yes Take 1 tablet (10 mg total) by mouth at bedtime. Kozlow, Eric J, MD Taking Active   NEURONTIN  100 MG capsule [id number] Yes Take 2 capsules (200 mg total) by mouth 2 (two) times daily. TAKE 1-2 CAPSULES EVERY 3-4 TIMES DAILY. Kozlow, Rema Care, MD Taking Active   nitroGLYCERIN  (NITROSTAT ) 0.4 MG SL tablet [id number] Yes Place 1 tablet (0.4 mg  total) under the tongue every 5 (five) minutes as needed for chest pain. Vita Grip, MD Taking Active   nortriptyline  (PAMELOR ) 10 MG capsule [id number] Yes TAKE TWO CAPSULES BY MOUTH AT BEDTIME Phebe Brasil, MD Taking Active   ondansetron  (ZOFRAN ) 4 MG tablet [id number] Yes Take 1 tablet (4 mg total) by mouth every 8 (eight) hours as needed for nausea or vomiting. Maxie Spaniel, MD Taking Active   pantoprazole  (PROTONIX ) 40 MG tablet [id number] Yes Take 1 tablet (40 mg total) by mouth 2 (two) times daily. Gomez-Caraballo, Maria, MD Taking Active   potassium chloride  SA (KLOR-CON  M) 20 MEQ tablet [id number] Yes Take 2 tablets (40 mEq total) by mouth daily. Marlyse Single T, PA-C  Taking Active   psyllium (HYDROCIL/METAMUCIL) 95 % PACK [id number] Yes Take 1 packet by mouth daily. [provider] Taking Active Self  sertraline  (ZOLOFT ) 100 MG tablet [id number] Yes Take 1 tablet (100 mg total) by mouth daily. Gomez-Caraballo, Maria, MD Taking Active   sodium chloride  (OCEAN) 0.65 % SOLN nasal spray [id number] Yes PLACE 1 SPRAY INTO BOTH NOSTRILS AS NEEDED FOR CONGESTION. Theron Flavin, DO Taking Active Self  SUMAtriptan  (IMITREX ) 25 MG tablet [id number] Yes Take 1 tablet (25 mg total) by mouth every 2 (two) hours as needed for migraine. May repeat in 2 hours if headache persists or recurs. Phebe Brasil, MD Taking Active Self  SYMBICORT  160-4.5 MCG/ACT inhaler [id number] Yes Inhale 2 puffs into the lungs in the morning and at bedtime. Kozlow, Rema Care, MD Taking Active   Tezepelumab -ekko (TEZSPIRE ) 210 MG/1. SOAJ [id number] Yes Inject 210 mg into the skin every 28 (twenty-eight) days. Kozlow, Rema Care, MD Taking Active   triamcinolone  (NASACORT ) 55 MCG/ACT AERO nasal inhaler [id number] Yes Place 1 spray into the nose 2 (two) times daily. 1 spray each nostril 2 times per day Fabienne Holter, MD Taking Active             Recommendation:   PCP Follow-up  Follow Up Plan:   Telephone follow up appointment with care management team member scheduled for:  10/25/23  2 pm      Augustin Leber RN, BSN, [facility] [place]  [facility], [facility]  Care Coordinator Phone: [phone]

## 2023-09-28 ENCOUNTER — Ambulatory Visit

## 2023-09-28 ENCOUNTER — Other Ambulatory Visit: Payer: Self-pay

## 2023-09-28 DIAGNOSIS — J4550 Severe persistent asthma, uncomplicated: Secondary | ICD-10-CM

## 2023-09-28 MED ORDER — TEZEPELUMAB-EKKO 210 MG/1.91ML ~~LOC~~ SOSY
210.0000 mg | PREFILLED_SYRINGE | SUBCUTANEOUS | Status: AC
  Administered 2023-09-28 – 2024-05-14 (×9): 210 mg via SUBCUTANEOUS

## 2023-09-28 NOTE — Progress Notes (Signed)
 Immunotherapy   Patient Details  Name: [name] [name] MRN: [id number] Date of Birth: 1960-11-24  09/28/2023  [name] started injections for Asthma, Patient received 210 mg of Tezspire  and waited 15 minutes with no problems. Frequency: every 28 days Consent signed and patient instructions given.   Denton Flakes 09/28/2023, 9:34 AM

## 2023-09-29 ENCOUNTER — Ambulatory Visit: Admitting: Surgical

## 2023-09-29 DIAGNOSIS — M75102 Unspecified rotator cuff tear or rupture of left shoulder, not specified as traumatic: Secondary | ICD-10-CM

## 2023-09-29 DIAGNOSIS — M19012 Primary osteoarthritis, left shoulder: Secondary | ICD-10-CM

## 2023-10-01 ENCOUNTER — Encounter: Payer: Self-pay | Admitting: Surgical

## 2023-10-01 NOTE — Progress Notes (Signed)
 Follow-up Office Visit Note   Patient: [name] [name]           Date of Birth: [birth year]           MRN: [id number] Visit Date: 09/29/2023 Requested by: Keator Clunes, MD [address] [place],  [place] [address] PCP: Hartel Clunes, MD  Subjective: Chief Complaint  Patient presents with   Left Shoulder - Pain, Follow-up    CT review    HPI: [name] [name] is a 63 y.o. female who returns to the office for follow-up visit.    Plan at last visit was: [name] [name] is a 63 y.o. female who presents to the office for review of left shoulder MRI.  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  She had increased degenerative changes of the glenohumeral joint as noted on previous arthroscopy and has had occasional glenohumeral injection since her surgery for her symptomatic shoulder arthritis.  She does have symptomatic weakness with external rotation and fairly consistent pain that bothers her with any prolonged activity, especially lifting her arm out in front of her or out to the side.  We discussed options available to patient and the main options would be reverse shoulder arthroplasty versus revision rotator cuff tear repair.  Patient wants the best option at 1 shoulder surgery for a functional and as close to pain-free shoulder as possible.  We discussed that for her situation, reverse shoulder replacement may be her best option.  We discussed this in detail today and she would like to proceed.  CT scan ordered for preoperative planning purposes.  Follow-up after CT scan to review results and further discuss surgery with Dr. Rozelle Corning.   Since then, patient notes she has similar pain that is steadily worsening.  Pain is worse with any shoulder range of motion especially overhead motion.  Is waking her up pretty much every night.  Taking ibuprofen  for pain control.  Was taking occasional tramadol  but did not like the way that this made her  feel.              ROS: All systems reviewed are negative as they relate to the chief complaint within the history of present illness.  Patient denies fevers or chills.  Assessment & Plan: Visit Diagnoses:  1. Arthritis of left shoulder region     Plan: [name] [name] is a 62 y.o. female who returns to the office for follow-up visit.  Plan from last visit was noted above in HPI.  They now return with left shoulder CT scan having been completed for preop planning purposes.  As we have discussed in the past, best surgical option for [name] [name] is reverse shoulder arthroplasty as she would like to just 1 surgical intervention with least chance of revision surgery.  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  Also has mild to moderate arthritis as best noted on CT scan of the left shoulder.  We again discussed the concept of reverse shoulder replacement as well as the risks and benefits of the procedure including but not limited to the risk of infection, instability, need for revision surgery, medical complication from surgery.  Discussed the recovery timeframe.  After lengthy discussion with the use of models for illustrative purposes, patient would still like to proceed with surgery.  She will be posted and follow-up afterward  Follow-Up Instructions: No follow-ups on file.   Orders:  No orders of the  defined types were placed in this encounter.  No orders of the defined types were placed in this encounter.     Procedures: No procedures performed   Clinical Data: No additional findings.  Objective: Vital Signs: There were no vitals taken for this visit.  Physical Exam:  Constitutional: Patient appears well-developed HEENT:  Head: Normocephalic Eyes:EOM are normal Neck: Normal range of motion Cardiovascular: Normal rate Pulmonary/chest: Effort normal Neurologic: Patient is alert Skin: Skin is warm Psychiatric: Patient has normal mood  and affect  Ortho Exam: Ortho exam demonstrates left shoulder with 50 degrees X rotation, 90 degrees abduction, 130 degrees forward elevation passively.  Active forward flexion to about 90 degrees.  Axillary nerve is intact with deltoid firing.  Incisions well-healed from prior surgery.  Subscap strength rated 5/5.  2+ radial pulse of the left upper extremity.  Specialty Comments:  MRI CERVICAL SPINE WITHOUT CONTRAST   TECHNIQUE: Multiplanar, multisequence MR imaging of the cervical spine was performed. No intravenous contrast was administered.   COMPARISON:  None Available.   FINDINGS: Alignment: No static listhesis. Loss of the normal cervical lordosis with straightening.   Vertebrae: No acute fracture, evidence of discitis, or aggressive bone lesion.   Cord: Normal signal and morphology.   Posterior Fossa, vertebral arteries, paraspinal tissues: Posterior fossa demonstrates no focal abnormality. Vertebral artery flow voids are maintained. Paraspinal soft tissues are unremarkable.   Disc levels:   Discs: Mild degenerative disease with disc height loss at C4-5.   C2-3: No significant disc bulge. No neural foraminal stenosis. No central canal stenosis.   C3-4: Tiny central disc protrusion. No foraminal or central canal stenosis.   C4-5: Mild broad-based disc bulge. Bilateral uncovertebral degenerative changes. Mild bilateral foraminal stenosis. No central canal stenosis.   C5-6: No significant disc bulge. No neural foraminal stenosis. No central canal stenosis.   C6-7: Broad-based disc bulge with a central disc protrusion contacting the ventral cervical spinal cord. No foraminal or central canal stenosis.   C7-T1: Broad-based disc bulge eccentric towards the right. Moderate right and mild left foraminal stenosis. No spinal stenosis.   IMPRESSION: 1. At C4-5 there is a mild broad-based disc bulge. Bilateral uncovertebral degenerative changes. Mild bilateral  foraminal stenosis. 2. At C6-7 there is a broad-based disc bulge with a central disc protrusion contacting the ventral cervical spinal cord. 3. At C7-T1 there is a broad-based disc bulge eccentric towards the right. Moderate right and mild left foraminal stenosis. 4. No acute osseous injury of the cervical spine.     Electronically Signed   By: Onnie Bilis M.D.   On: 07/28/2022 08:59  Imaging: No results found.   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   Chronic migraine w/o aura w/o status migrainosus, not intractable 08/27/2018   Central perforation of tympanic membrane of left ear 12/09/2016   Eustachian tube dysfunction, left 03/09/2016   Spondylosis of lumbar region without myelopathy or radiculopathy 10/08/2015   Liver fibrosis (HCC) 12/04/2014   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   Vitamin D  deficiency 06/29/2014   Secondary adrenal insufficiency (HCC) 06/29/2014   History of tympanostomy tube placement 02/07/2014  Hepatitis C virus infection cured after antiviral drug therapy 03/16/2012   Health care maintenance 12/15/2011   HTN (hypertension) 10/05/2010   Hyperlipidemia associated with type 2 diabetes mellitus (HCC) 10/23/2008   HIV disease (HCC) 05/09/2006   Allergic rhinitis 05/09/2006   GERD 05/09/2006   Past Medical History:  Diagnosis Date   Allergic rhinitis 05/09/2006   Allergy     Anxiety    Arthritis    Asthma    CAD (coronary artery disease) 03/19/2023   CCTA 03/16/23: CAC score 471 (98th percentile), RCA diffuse 25-49, dLM minimal Ca2+  plaque, LAD prox minimal plaques, pLCx < 24; TPV 542 mm3 (86th percentile)    CHF (congestive heart failure) (HCC)    Chronic back pain    Chronic night sweats 08/23/2022   Diabetes mellitus without complication (HCC) 04/25/2014   GERD (gastroesophageal reflux disease)    Heart murmur    as a child   Hepatitis C    genotype 1b, 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 s/p 12 week course of simeprevir and sofosbuvir between October 2014 and January 2015 with resolution.   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   Pneumonia    Prediabetes    Prediabetes 02/18/2022   Refusal of blood transfusions as patient is Jehovah's Witness    Screening for cervical cancer 10/20/2022   Secondary adrenal insufficiency (HCC) 06/29/2014   Urticaria    Vaginal atrophy 10/20/2022    Family History  Problem Relation Age of Onset   Heart disease Mother    Diabetes Mother    Stroke Mother    Heart disease Father    Stroke Father    Diabetes Father    Hepatitis Sister        hcv   Asthma Sister    Allergic rhinitis Sister    Stroke Other    Colon polyps Brother    Renal Disease Brother    Cancer Sister        lung   Asthma Sister    Allergic rhinitis Sister    Cancer Maternal Aunt    Cancer Maternal Aunt    Colon cancer Neg Hx    Esophageal cancer Neg Hx    Stomach cancer Neg Hx    Rectal cancer Neg Hx    Angioedema Neg Hx    Eczema Neg Hx    Urticaria Neg Hx     Past Surgical History:  Procedure Laterality Date   BUNIONECTOMY     b/l   COLECTOMY     2003 for diverticulitis, had colostomy bag and then reversed   COLONOSCOPY     HAND SURGERY     HAND SURGERY Right    right thumb surgery Dr Donzella Galley 2021   NASAL SINUS SURGERY     SHOULDER ARTHROSCOPY WITH OPEN ROTATOR CUFF REPAIR AND DISTAL CLAVICLE  ACROMINECTOMY Left 08/31/2021   Procedure: LEFT SHOULDER ARTHROSCOPY, DEBRIDEMENT,  ROTATOR CUFF TEAR REPAIR;  Surgeon: Jasmine Mesi, MD;  Location: [facility] OR;  Service: Orthopedics;  Laterality: Left;   SHOULDER SURGERY     left   TONSILLECTOMY     Social History   Occupational History   Occupation: Disabled  Tobacco Use   Smoking status: Former    Current packs/day: 0.00  Types: Cigarettes    Start date: 04/26/1987    Quit date: 04/26/2007    Years since quitting: 16.4   Smokeless tobacco: Never   Tobacco comments:    QUIT 2009  Vaping Use   Vaping status: Never Used  Substance and Sexual Activity   Alcohol use: No    Alcohol/week: 0.0 standard drinks of alcohol   Drug use: Yes    Types: Marijuana    Comment: cannabis in the past   Sexual activity: Not Currently    Partners: Male    Birth control/protection: Condom    Comment: declined condoms

## 2023-10-02 ENCOUNTER — Other Ambulatory Visit: Payer: Self-pay

## 2023-10-02 ENCOUNTER — Telehealth: Payer: Self-pay

## 2023-10-02 ENCOUNTER — Other Ambulatory Visit

## 2023-10-02 DIAGNOSIS — Z5181 Encounter for therapeutic drug level monitoring: Secondary | ICD-10-CM | POA: Diagnosis not present

## 2023-10-02 NOTE — Telephone Encounter (Signed)
   Pre-operative Risk Assessment    Patient Name: [name] [name]  DOB: 03/08/1961 MRN: [id number]   Date of last office visit: 02/27/23 with Marlyse Single, PA-C Date of next office visit: none   Request for Surgical Clearance    Procedure:  Left reverse shoulder arthroplasty   Date of Surgery:  Clearance TBD                                Surgeon:  Dr. Lynita Saris Surgeon's Group or Practice Name:  [facility] Phone number:  [phone] Fax number:  [phone]   Type of Clearance Requested:   - Medical    Type of Anesthesia:  General    Additional requests/questions:    SignedSudie Ely   10/02/2023, 2:00 PM

## 2023-10-02 NOTE — Telephone Encounter (Signed)
   Name: RAGENA FIOLA  DOB: 1960/08/26  MRN: [id number]  Primary Cardiologist: Janelle Mediate, MD  Chart reviewed as part of pre-operative protocol coverage. Because of Euna Armon Overholt's past medical history and time since last visit, she will require a follow-up in-office visit in order to better assess preoperative cardiovascular risk.  Pre-op covering staff: - Please schedule appointment and call patient to inform them. If patient already had an upcoming appointment within acceptable timeframe, please add "pre-op clearance" to the appointment notes so provider is aware. - Please contact requesting surgeon's office via preferred method (i.e, phone, fax) to inform them of need for appointment prior to surgery.   Ava Boatman, NP  10/02/2023, 3:35 PM

## 2023-10-02 NOTE — Telephone Encounter (Signed)
 Pt has been scheduled to see Ervin Heath, [facility] 10/13/23 @ 1:55 for preop clearance.

## 2023-10-02 NOTE — Telephone Encounter (Signed)
 thx

## 2023-10-02 NOTE — Progress Notes (Unsigned)
  PROCEDURE RECORD [facility]   Name: [name] [name] DOB:[birth year] MRN: [id number]  Date:10/02/2023  Physician: Janeece Mechanic, MD    Nurse/CMA: Moore CMA/ ShumakerRN  Allergies:  Allergies  Allergen Reactions   Acetaminophen  Other (See Comments)    Inflamed liver, hospitalized  Other Reaction(s): liver demage   Morphine  Sulfate Hives and Shortness Of Breath   Triamterene Hives   Aspirin-Caffeine Diarrhea and Other (See Comments)    liver damage; upset stomach  Other Reaction(s): liver demage   Dyazide [Hydrochlorothiazide -Triamterene] Hives   Empagliflozin Diarrhea    (Jardiance) stomach ache   Metformin  Hcl Er Other (See Comments) and Nausea Only    upset stomach   Topamax  [Topiramate ] Other (See Comments)    Vision disturbances.   Citalopram  Itching, Rash and Other (See Comments)   Emtricitabine -Tenofovir  Df Rash    Descovy    Lisinopril  Other (See Comments)    Cough    Losartan  Nausea Only and Rash    Pt had rash, worsening dizziness, and nausea after starting losartan , which improved after stopping losartan    Triamterene-Hctz Rash    Consent Signed: {yes no:314532}  Is patient diabetic? Yes.    CBG today? ***  Pregnant: {yes no:314532} LMP: No LMP recorded. Patient is postmenopausal. (age 23-55)  Anticoagulants: {Yes/No:19989} Anti-inflammatory: {Yes/No:19989} Antibiotics: {Yes/No:19989}  Procedure: Bilateral Sacroiliac Steroid injection Position: Prone Start Time: ***  End Time: ***  Fluoro Time: ***  RN/CMA Moore CMA Moore CMA    Time *** ***    BP *** ***    Pulse *** ***    Respirations *** ***    O2 Sat *** ***    S/S *** ***    Pain Level *** ***     D/C home with ***, [name] & O X 3, D/C instructions reviewed, and sits independently.

## 2023-10-03 ENCOUNTER — Encounter: Attending: Physical Medicine & Rehabilitation | Admitting: Physical Medicine & Rehabilitation

## 2023-10-03 ENCOUNTER — Encounter: Payer: Self-pay | Admitting: Physical Medicine & Rehabilitation

## 2023-10-03 ENCOUNTER — Ambulatory Visit: Payer: Self-pay | Admitting: Infectious Diseases

## 2023-10-03 VITALS — BP 124/81 | HR 81 | Temp 98.3°F | Resp 16 | Ht 60.43 in | Wt 175.0 lb

## 2023-10-03 DIAGNOSIS — M533 Sacrococcygeal disorders, not elsewhere classified: Secondary | ICD-10-CM | POA: Diagnosis not present

## 2023-10-03 LAB — COMPLETE METABOLIC PANEL WITHOUT GFR
AG Ratio: 1.6 (calc) (ref 1.0–2.5)
ALT: 25 U/L (ref 6–29)
AST: 22 U/L (ref 10–35)
Albumin: 4.4 g/dL (ref 3.6–5.1)
Alkaline phosphatase (APISO): 59 U/L (ref 37–153)
BUN: 17 mg/dL (ref 7–25)
CO2: 27 mmol/L (ref 20–32)
Calcium: 9.4 mg/dL (ref 8.6–10.4)
Chloride: 100 mmol/L (ref 98–110)
Creat: 0.94 mg/dL (ref 0.50–1.05)
Globulin: 2.7 g/dL (ref 1.9–3.7)
Glucose, Bld: 145 mg/dL — ABNORMAL HIGH (ref 65–99)
Potassium: 3.6 mmol/L (ref 3.5–5.3)
Sodium: 140 mmol/L (ref 135–146)
Total Bilirubin: 0.4 mg/dL (ref 0.2–1.2)
Total Protein: 7.1 g/dL (ref 6.1–8.1)

## 2023-10-03 MED ORDER — LIDOCAINE HCL (PF) 2 % IJ SOLN
2.0000 mL | Freq: Once | INTRAMUSCULAR | Status: AC
  Administered 2023-10-03: 2 mL

## 2023-10-03 MED ORDER — BETAMETHASONE SOD PHOS & ACET 6 (3-3) MG/ML IJ SUSP
6.0000 mg | Freq: Once | INTRAMUSCULAR | Status: AC
  Administered 2023-10-03: 6 mg via INTRALESIONAL

## 2023-10-03 MED ORDER — LIDOCAINE HCL 1 % IJ SOLN
5.0000 mL | Freq: Once | INTRAMUSCULAR | Status: AC
  Administered 2023-10-03: 5 mL

## 2023-10-03 MED ORDER — IOHEXOL 180 MG/ML  SOLN
3.0000 mL | Freq: Once | INTRAMUSCULAR | Status: AC
  Administered 2023-10-03: 3 mL

## 2023-10-03 NOTE — Patient Instructions (Signed)
Sacroiliac injection was performed today. A combination of numbing medicine (lidocaine) plus a cortisone medicine (betamethasone) was injected. The injection was done under x-ray guidance. This procedure has been performed to help reduce low back and buttocks pain as well as potentially hip pain. The duration of this injection is variable lasting from hours to  Months. It may repeated if needed. 

## 2023-10-03 NOTE — Progress Notes (Signed)
 Bilateral sacroiliac injections under fluoroscopic guidance  Indication: Low back and buttocks pain not relieved by medication management and other conservative care.Has had failure of PT and OTC meds.  Hx HIV, Last CD4 May  2025 518 Last injection 06/29/23   Informed consent was obtained after describing risks and benefits of the procedure with the patient, this includes bleeding, bruising, infection, paralysis and medication side effects. The patient wishes to proceed and has given written consent. The patient was placed in a prone position. The lumbar and sacral area was marked and prepped with Betadine . A 25-gauge 1-1/2 inch needle was inserted into the skin and subcutaneous tissue and 1 mL of 1% lidocaine  was injected into each side. Then a 25-gauge 3 inch spinal needle was inserted under fluoroscopic guidance into the left sacroiliac joint. AP and lateral images were utilized. Omnipaque  180x0.5 mL under live fluoroscopy demonstrated no intravascular uptake. Then a solution containing one ML of 6 mg per mL Celestone  in 2 ML of 2% lidocaine  MPF was injected x1.5 mL. This same procedure was repeated on the right side using the same needle, injectate, and technique. Patient tolerated the procedure well. Post procedure instructions were given. Please see post procedure form.

## 2023-10-09 ENCOUNTER — Other Ambulatory Visit: Payer: Self-pay

## 2023-10-09 ENCOUNTER — Ambulatory Visit: Admitting: Student

## 2023-10-09 ENCOUNTER — Encounter: Payer: Self-pay | Admitting: Student

## 2023-10-09 ENCOUNTER — Other Ambulatory Visit: Payer: Self-pay | Admitting: Allergy and Immunology

## 2023-10-09 ENCOUNTER — Other Ambulatory Visit (HOSPITAL_COMMUNITY): Payer: Self-pay

## 2023-10-09 VITALS — BP 138/83 | HR 82 | Temp 98.4°F | Ht 60.0 in | Wt 171.8 lb

## 2023-10-09 DIAGNOSIS — E559 Vitamin D deficiency, unspecified: Secondary | ICD-10-CM

## 2023-10-09 DIAGNOSIS — I10 Essential (primary) hypertension: Secondary | ICD-10-CM

## 2023-10-09 MED ORDER — VITAMIN D 25 MCG (1000 UNIT) PO TABS: 3000.0000 [IU] | ORAL_TABLET | Freq: Every day | ORAL | 3 refills | Status: DC

## 2023-10-09 MED ORDER — GLUCOSE BLOOD VI STRP: ORAL_STRIP | 12 refills | Status: AC

## 2023-10-09 MED ORDER — VEOZAH 45 MG PO TABS
1.0000 | ORAL_TABLET | Freq: Every day | ORAL | 0 refills | Status: DC
  Filled 2023-10-09: qty 30, 30d supply, fill #0

## 2023-10-09 MED ORDER — CHLORTHALIDONE 25 MG PO TABS: 25.0000 mg | ORAL_TABLET | Freq: Every day | ORAL | 1 refills | Status: DC

## 2023-10-09 NOTE — Patient Instructions (Addendum)
 [name] [name] you for allowing me to take part in your care today.  Here are your instructions.  1. We are going to change you to Chlorthalidone which is a little bit of a stronger medications than Hydrochlorothiazide     2. I am going to check your Vitamin D  levels  3. I sent refills to your pharmacy  4. Please come back in 1 month and we can check your kidney function at that time.   5. Keep a blood pressure log and bring that and your cuff   PLEASE BRING YOUR MEDICATIONS TO EVERY APPOINTMENT  Thank you, Dr. Lydia Sams  If you have any other questions please contact the internal medicine clinic at [phone] If it is after hours, please call the [facility] at [phone] and then ask the person who picks up for the resident on call.

## 2023-10-09 NOTE — Progress Notes (Signed)
 CC: Blood pressure follow-up  HPI:  [name] is a 63 y.o. female with a past medical history of CAD, migraines, hypertension, GERD who presents for follow-up appointment.  Please see assessment and plan for full HPI.  Past Medical History:  Diagnosis Date   Allergic rhinitis 05/09/2006   Allergy     Anxiety    Arthritis    Asthma    CAD (coronary artery disease) 03/19/2023   CCTA 03/16/23: CAC score 471 (98th percentile), RCA diffuse 25-49, dLM minimal Ca2+ plaque, LAD prox minimal plaques, pLCx < 24; TPV 542 mm3 (86th percentile)    CHF (congestive heart failure) (HCC)    Chronic back pain    Chronic night sweats 08/23/2022   Diabetes mellitus without complication (HCC) 04/25/2014   GERD (gastroesophageal reflux disease)    Heart murmur    as a child   Hepatitis C    genotype 1b, 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 s/p 12 week course of simeprevir and sofosbuvir between October 2014 and January 2015 with resolution.   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   Pneumonia    Prediabetes    Prediabetes 02/18/2022   Refusal of blood transfusions as patient is Jehov[employer]'s Witness    Screening for cervical cancer 10/20/2022   Secondary adrenal insufficiency (HCC) 06/29/2014   Urticaria    Vaginal atrophy 10/20/2022     Current Outpatient Medications:    chlorthalidone (HYGROTON) 25 MG tablet, Take 1 tablet (25 mg total) by mouth daily., Disp: 90 tablet, Rfl: 1   cholecalciferol  (VITAMIN D3) 25 MCG (1000 UNIT) tablet, Take 3 tablets (3,000 Units total) by mouth daily., Disp: 270 tablet, Rfl: 3   glucose blood test strip, Use as instructed, Disp: 100 each, Rfl: 12   ACCU-CHEK GUIDE test strip, use to check blood sugar In Vitro Once a day DX: E11.9 for 90 days, Disp: , Rfl:    albuterol  (VENTOLIN   HFA) 108 (90 Base) MCG/ACT inhaler, Inhale 2 puffs into the lungs every 4 (four) hours as needed for wheezing or shortness of breath., Disp: 18 g, Rfl: 1   amLODipine  (NORVASC ) 10 MG tablet, Take 1 tablet (10 mg total) by mouth daily., Disp: 90 tablet, Rfl: 3   atorvastatin  (LIPITOR) 10 MG tablet, Take 0.5 tablets (5 mg total) by mouth daily., Disp: 45 each, Rfl: 10   betamethasone  valerate ointment (VALISONE ) 0.1 %, Apply 1 Application topically 2 (two) times daily., Disp: 30 g, Rfl: 0   cabotegravir  & rilpivirine  ER (CABENUVA ) 600 & 900 MG/3ML injection, Inject 1 kit into the muscle every 8 (eight) weeks., Disp: 6 mL, Rfl: 6   cetirizine  (ZYRTEC ) 10 MG tablet, Take 2 tablets (20 mg total) by mouth 2 (two) times daily., Disp: 180 tablet, Rfl: 1   ciprofloxacin -dexamethasone  (CIPRODEX ) OTIC suspension, Place 4 drops into the left ear as needed (ear infection)., Disp: , Rfl:    diclofenac  (VOLTAREN ) 50 MG EC tablet, TAKE 1 TABLET (50 MG TOTAL) BY MOUTH TWO (TWO) TIMES DAILY, Disp: 60 tablet, Rfl: 0   ezetimibe  (ZETIA ) 10 MG tablet, Take 1 tablet (10 mg total) by mouth daily., Disp: 90 tablet, Rfl: 3   famotidine  (PEPCID ) 40 MG tablet, Take 1 tablet (40 mg total) by mouth at bedtime.,  Disp: 90 tablet, Rfl: 3   Fezolinetant  (VEOZAH ) 45 MG TABS, Take 1 tablet (45 mg total) by mouth daily., Disp: 30 tablet, Rfl: 0   gabapentin  (NEURONTIN ) 400 MG capsule, Take 1 capsule (400 mg total) by mouth at bedtime., Disp: 90 capsule, Rfl: 1   GLOBAL EASE INJECT PEN NEEDLES 32G X 4 MM MISC, USE AS DIRECTED WITH VICTOZA for 30, Disp: , Rfl:    hydrocortisone  (CORTEF ) 5 MG tablet, Take 2.5-5 mg by mouth See admin instructions. Take 1 tablet (5 mg) by mouth in the morning (scheduled) & may taken an additional 0.5 tablet (2.5 mg) by mouth in the evening if needed., Disp: , Rfl:    liraglutide (VICTOZA) 18 MG/3ML SOPN, Inject 1.8 mg into the skin every evening., Disp: , Rfl:    metoprolol  tartrate (LOPRESSOR ) 50 MG tablet,  Take 1 tablet (50 mg total) by mouth 2 (two) times daily., Disp: 180 tablet, Rfl: 3   montelukast  (SINGULAIR ) 10 MG tablet, Take 1 tablet (10 mg total) by mouth at bedtime., Disp: 90 tablet, Rfl: 1   NEURONTIN  100 MG capsule, Take 2 capsules (200 mg total) by mouth 2 (two) times daily. TAKE 1-2 CAPSULES EVERY 3-4 TIMES DAILY., Disp: 360 capsule, Rfl: 1   nitroGLYCERIN  (NITROSTAT ) 0.4 MG SL tablet, Place 1 tablet (0.4 mg total) under the tongue every 5 (five) minutes as needed for chest pain., Disp: 25 tablet, Rfl: 1   nortriptyline  (PAMELOR ) 10 MG capsule, TAKE TWO CAPSULES BY MOUTH AT BEDTIME, Disp: 180 capsule, Rfl: 10   ondansetron  (ZOFRAN ) 4 MG tablet, Take 1 tablet (4 mg total) by mouth every 8 (eight) hours as needed for nausea or vomiting., Disp: 20 tablet, Rfl: 0   pantoprazole  (PROTONIX ) 40 MG tablet, Take 1 tablet (40 mg total) by mouth 2 (two) times daily., Disp: 180 tablet, Rfl: 3   potassium chloride  SA (KLOR-CON  M) 20 MEQ tablet, Take 2 tablets (40 mEq total) by mouth daily., Disp: 180 tablet, Rfl: 3   psyllium (HYDROCIL/METAMUCIL) 95 % PACK, Take 1 packet by mouth daily., Disp: , Rfl:    sertraline  (ZOLOFT ) 100 MG tablet, Take 1 tablet (100 mg total) by mouth daily., Disp: 90 tablet, Rfl: 1   sodium chloride  (OCEAN) 0.65 % SOLN nasal spray, PLACE 1 SPRAY INTO BOTH NOSTRILS AS NEEDED FOR CONGESTION., Disp: 3 Bottle, Rfl: 0   SUMAtriptan  (IMITREX ) 25 MG tablet, Take 1 tablet (25 mg total) by mouth every 2 (two) hours as needed for migraine. May repeat in 2 hours if headache persists or recurs., Disp: 12 tablet, Rfl: 6   SYMBICORT  160-4.5 MCG/ACT inhaler, Inhale 2 puffs into the lungs in the morning and at bedtime., Disp: 30.6 g, Rfl: 1   Tezepelumab -ekko (TEZSPIRE ) 210 MG/1. SOAJ, Inject 210 mg into the skin every 28 (twenty-eight) days., Disp: 1.91 mL, Rfl: 11   triamcinolone  (NASACORT ) 55 MCG/ACT AERO nasal inhaler, Place 1 spray into the nose 2 (two) times daily. 1 spray each  nostril 2 times per day, Disp: 50.7 g, Rfl: 1  Current Facility-Administered Medications:    tezepelumab -ekko (TEZSPIRE ) 210 MG/1. syringe 210 mg, 210 mg, Subcutaneous, Q28 days, Kozlow, Rema Care, MD, 210 mg at 09/28/23 1610  Review of Systems:    Negative except for what is stated in HPI  Physical Exam:  Vitals:   10/09/23 0932  BP: 138/83  Pulse: 82  Temp: 98.4 F (36.9 C)  TempSrc: Oral  SpO2: 93%  Weight: 171 lb 12.8 oz (77.9 kg)  Height: 5' (1.524 m)   General: Patient is sitting comfortably in the room  Head: Normocephalic, atraumatic  Cardio: Regular rate and rhythm, no murmurs, rubs or gallops Pulmonary: Clear to ausculation bilaterally with no rales, rhonchi, and crackles    Assessment & Plan:   HTN (hypertension) Patient has a past medical history of hypertension.  She also has a history of type 2 diabetes mellitus.  Her goal would be to be less than 130/80.  Today her blood pressure is elevated at 138/83.  At home she states she checks her blood pressure and it is usually in the 130s.  She is not at goal.  Her current medications include amlodipine  10 mg daily, hydrochlorothiazide  25 mg daily, metoprolol  tartrate 50 mg twice daily.  She reports adherence to these medications.  Dispense reports also show that she picks up the medications regularly.  She has had tried other antihypertensive such as spironolactone , losartan , lisinopril  with inability to tolerate.  Given patient is on multiple medications, I do not want to add to her medication burden.  Will try chlorthalidone in the place of HCTZ.  Plan: - Stop hydrochlorothiazide  - Start chlorthalidone 25 mg daily - Continue amlodipine  10 mg daily - Continue metoprolol  tartrate 50 mg twice daily -Follow-up in 1 month for BMP and blood pressure check  Vitamin D  deficiency Patient has a past medical history of vitamin D  deficiency.  She needs a refill on her vitamin D  today.  Will check vitamin D  levels.  Plan: -  Review of vitamin D  3000 IU daily - Follow-up vitamin D  level  Patient discussed with Dr. Teola Felling, DO PGY-2 Internal Medicine Resident

## 2023-10-09 NOTE — Assessment & Plan Note (Signed)
 Patient has a past medical history of hypertension.  She also has a history of type 2 diabetes mellitus.  Her goal would be to be less than 130/80.  Today her blood pressure is elevated at 138/83.  At home she states she checks her blood pressure and it is usually in the 130s.  She is not at goal.  Her current medications include amlodipine  10 mg daily, hydrochlorothiazide  25 mg daily, metoprolol  tartrate 50 mg twice daily.  She reports adherence to these medications.  Dispense reports also show that she picks up the medications regularly.  She has had tried other antihypertensive such as spironolactone , losartan , lisinopril  with inability to tolerate.  Given patient is on multiple medications, I do not want to add to her medication burden.  Will try chlorthalidone in the place of HCTZ.  Plan: - Stop hydrochlorothiazide  - Start chlorthalidone 25 mg daily - Continue amlodipine  10 mg daily - Continue metoprolol  tartrate 50 mg twice daily -Follow-up in 1 month for BMP and blood pressure check

## 2023-10-09 NOTE — Assessment & Plan Note (Signed)
 Patient has a past medical history of vitamin D  deficiency.  She needs a refill on her vitamin D  today.  Will check vitamin D  levels.  Plan: - Review of vitamin D  3000 IU daily - Follow-up vitamin D  level

## 2023-10-10 ENCOUNTER — Ambulatory Visit: Payer: Self-pay | Admitting: Student

## 2023-10-10 LAB — VITAMIN D 25 HYDROXY (VIT D DEFICIENCY, FRACTURES): Vit D, 25-Hydroxy: 37.3 ng/mL (ref 30.0–100.0)

## 2023-10-10 NOTE — Progress Notes (Signed)
 Internal Medicine Clinic Attending  Case discussed with the resident at the time of the visit.  We reviewed the resident's history and exam and pertinent patient test results.  I agree with the assessment, diagnosis, and plan of care documented in the resident's note.

## 2023-10-12 ENCOUNTER — Other Ambulatory Visit: Payer: Self-pay

## 2023-10-12 ENCOUNTER — Other Ambulatory Visit (HOSPITAL_COMMUNITY): Payer: Self-pay

## 2023-10-12 NOTE — Progress Notes (Signed)
 Specialty Pharmacy Refill Coordination Note  [name] [name] is a 63 y.o. female assessed today regarding refills of clinic administered specialty medication(s) Cabotegravir  & Rilpivirine  (CABENUVA )   Clinic requested Courier to Provider Office   Delivery date: 10/19/23   Verified address: [address] [name] [address]   Medication will be filled on 10/18/23.

## 2023-10-13 ENCOUNTER — Ambulatory Visit: Attending: Physician Assistant | Admitting: Physician Assistant

## 2023-10-13 ENCOUNTER — Encounter: Payer: Self-pay | Admitting: Physician Assistant

## 2023-10-13 ENCOUNTER — Other Ambulatory Visit: Payer: Self-pay

## 2023-10-13 VITALS — BP 124/77 | HR 81 | Resp 16 | Ht 60.0 in | Wt 175.2 lb

## 2023-10-13 DIAGNOSIS — Z01818 Encounter for other preprocedural examination: Secondary | ICD-10-CM | POA: Insufficient documentation

## 2023-10-13 DIAGNOSIS — E1169 Type 2 diabetes mellitus with other specified complication: Secondary | ICD-10-CM

## 2023-10-13 DIAGNOSIS — I2510 Atherosclerotic heart disease of native coronary artery without angina pectoris: Secondary | ICD-10-CM | POA: Insufficient documentation

## 2023-10-13 DIAGNOSIS — I10 Essential (primary) hypertension: Secondary | ICD-10-CM | POA: Diagnosis not present

## 2023-10-13 DIAGNOSIS — E785 Hyperlipidemia, unspecified: Secondary | ICD-10-CM | POA: Insufficient documentation

## 2023-10-13 NOTE — Patient Instructions (Signed)
 Medication Instructions:  NO CHANGES *If you need a refill on your cardiac medications before your next appointment, please call your pharmacy*  Lab Work: NO LABS If you have labs (blood work) drawn today and your tests are completely normal, you will receive your results only by: MyChart Message (if you have MyChart) OR A paper copy in the mail If you have any lab test that is abnormal or we need to change your treatment, we will call you to review the results.  Testing/Procedures: NO TESTING  Follow-Up: At [facility], you and your health needs are our priority.  As part of our continuing mission to provide you with exceptional heart care, our providers are all part of one team.  This team includes your primary Cardiologist (physician) and Advanced Practice Providers or APPs (Physician Assistants and Nurse Practitioners) who all work together to provide you with the care you need, when you need it.  Your next appointment:   6 month(s)  Provider:   Janelle Mediate, MD

## 2023-10-13 NOTE — Progress Notes (Unsigned)
 Cardiology Office Note   Date:  10/15/2023  ID:  [name] [name], DOB [birth year], MRN [id number] PCP: Elnora Ip, MD  [facility] Providers Cardiologist:  Maude Emmer, MD     History of Present Illness [name] [name] is a 63 y.o. female with past medical history of HIV, hepatitis C, hypertension, hyperlipidemia, DM2, history of pancreatitis, CKD, and mild nonobstructive CAD.  Patient is a Scientist, product/process development.  TTE in April 2022 showed EF 60 to 65%, no wall motion abnormality, moderate LVH,.  Myoview  in April 2022 showed no evidence of ischemia or infarction, EF 62%.  She had a normal ABI in September 2023.  Patient was last seen by Glendia Ferrier, PA-C in November 2024 for evaluation of chest discomfort.  Subsequent coronary CT obtained on 03/15/2023 demonstrated 25 to 49% diffuse disease in the RCA, minimal plaque in the LAD and the left main, less than 25% disease in proximal left circumflex artery, calcium  score 471 which placed the patient in 98th percentile for age and sex matched control.  Medical therapy was recommended for risk factor modification.  Echocardiogram obtained on 04/07/2023 showed EF 60 to 65%, normal regional wall motion abnormality, grade 1 DD, RVSP 22.7 mmHg.  Patient presents today for follow-up and preop clearance.  She has upcoming shoulder arthroplasty by Dr. Addie of orthopedic surgery.  She denies any chest pain or shortness of breath.  She is fairly active and has no problem accomplishing more than 4 METS of activity.  She has no lower extremity edema, orthopnea or PND.  Given the fairly recent coronary CT and echocardiogram, I did not recommend any further workup.  She is at acceptable risk to proceed with upcoming surgery from the cardiac perspective.  ROS:   She denies chest pain, palpitations, dyspnea, pnd, orthopnea, n, v, dizziness, syncope, edema, weight gain, or early satiety. All other systems reviewed and are otherwise negative except as noted  above.    Studies Reviewed EKG Interpretation Date/Time:  Friday October 13 2023 13:37:09 EDT Ventricular Rate:  81 PR Interval:  176 QRS Duration:  82 QT Interval:  398 QTC Calculation: 462 R Axis:   49  Text Interpretation: Normal sinus rhythm No significant ST-T wave changes Confirmed by Janene Boer [phone]) on 10/13/2023 1:43:14 PM    Cardiac Studies & Procedures   ______________________________________________________________________________________________   STRESS TESTS  MYOCARDIAL PERFUSION IMAGING 07/27/2020  Narrative  The left ventricular ejection fraction is normal (55-65%).  Nuclear stress EF: 62%.  There was no ST segment deviation noted during stress.  The study is normal.  This is a low risk study.  Negative stress test:  No evidence of ischemia or infarction.   ECHOCARDIOGRAM  ECHOCARDIOGRAM COMPLETE 04/07/2023  Narrative ECHOCARDIOGRAM REPORT    Patient Name:   [name] [name] Date of Exam: 04/07/2023 Medical Rec #:  [id number]     Height:       61.0 in Accession #:    [id number]    Weight:       168.0 lb Date of Birth:  [birth year]     BSA:          1.754 m Patient Age:    62 years      BP:           120/80 mmHg Patient Gender: F             HR:           82 bpm. Exam Location:  [address]  Procedure: 2D Echo, 3D Echo, Cardiac Doppler, Color Doppler and Limited Color Doppler  Indications:    R07.9 Chest pain  History:        Patient has prior history of Echocardiogram examinations, most recent 08/18/2020. CAD, HIV, Signs/Symptoms:Chest Pain; Risk Factors:Hypertension and Family History of Coronary Artery Disease.  Sonographer:    Nolon Berg BA, RDCS Referring Phys: [id number] GLENDIA DASEN WEAVER  IMPRESSIONS   1. Left ventricular ejection fraction, by estimation, is 60 to 65%. The left ventricle has normal function. The left ventricle has no regional wall motion abnormalities. There is mild asymmetric left ventricular hypertrophy of the  basal-septal segment. Left ventricular diastolic parameters are consistent with Grade I diastolic dysfunction (impaired relaxation). The average left ventricular global longitudinal strain is -18.7 %. The global longitudinal strain is normal. 2. Right ventricular systolic function is normal. The right ventricular size is normal. There is normal pulmonary artery systolic pressure. The estimated right ventricular systolic pressure is 22.7 mmHg. 3. The mitral valve is normal in structure. No evidence of mitral valve regurgitation. No evidence of mitral stenosis. 4. The aortic valve is tricuspid. There is mild calcification of the aortic valve. Aortic valve regurgitation is not visualized. No aortic stenosis is present. 5. The inferior vena cava is normal in size with greater than 50% respiratory variability, suggesting right atrial pressure of 3 mmHg.  FINDINGS Left Ventricle: Left ventricular ejection fraction, by estimation, is 60 to 65%. The left ventricle has normal function. The left ventricle has no regional wall motion abnormalities. The average left ventricular global longitudinal strain is -18.7 %. The global longitudinal strain is normal. The left ventricular internal cavity size was normal in size. There is mild asymmetric left ventricular hypertrophy of the basal-septal segment. Left ventricular diastolic parameters are consistent with Grade I diastolic dysfunction (impaired relaxation).  Right Ventricle: The right ventricular size is normal. No increase in right ventricular wall thickness. Right ventricular systolic function is normal. There is normal pulmonary artery systolic pressure. The tricuspid regurgitant velocity is 2.22 m/s, and with an assumed right atrial pressure of 3 mmHg, the estimated right ventricular systolic pressure is 22.7 mmHg.  Left Atrium: Left atrial size was normal in size.  Right Atrium: Right atrial size was normal in size.  Pericardium: There is no evidence of  pericardial effusion.  Mitral Valve: The mitral valve is normal in structure. No evidence of mitral valve regurgitation. No evidence of mitral valve stenosis.  Tricuspid Valve: The tricuspid valve is normal in structure. Tricuspid valve regurgitation is trivial.  Aortic Valve: The aortic valve is tricuspid. There is mild calcification of the aortic valve. Aortic valve regurgitation is not visualized. No aortic stenosis is present.  Pulmonic Valve: The pulmonic valve was normal in structure. Pulmonic valve regurgitation is not visualized.  Aorta: The aortic root is normal in size and structure.  Venous: The inferior vena cava is normal in size with greater than 50% respiratory variability, suggesting right atrial pressure of 3 mmHg.  IAS/Shunts: No atrial level shunt detected by color flow Doppler.   LEFT VENTRICLE PLAX 2D LVIDd:         3.60 cm   Diastology LVIDs:         2.20 cm   LV e' medial:    4.68 cm/s LV PW:         1.00 cm   LV E/e' medial:  9.5 LV IVS:        1.00 cm   LV e' lateral:  7.62 cm/s LVOT diam:     2.00 cm   LV E/e' lateral: 5.9 LV SV:         51 LV SV Index:   29        2D Longitudinal Strain LVOT Area:     3.14 cm  2D Strain GLS (A2C):   -20.7 % 2D Strain GLS (A3C):   -17.1 % 2D Strain GLS (A4C):   -18.4 % 2D Strain GLS Avg:     -18.7 %  3D Volume EF: 3D EF:        61 % LV EDV:       80 ml LV ESV:       31 ml LV SV:        49 ml  RIGHT VENTRICLE            IVC RV Basal diam:  3.00 cm    IVC diam: 1.30 cm RV Mid diam:    2.30 cm RV S prime:     8.95 cm/s TAPSE (M-mode): 1.8 cm  LEFT ATRIUM           Index        RIGHT ATRIUM           Index LA diam:      3.50 cm 2.00 cm/m   RA Area:     12.50 cm LA Vol (A4C): 22.3 ml 12.71 ml/m  RA Volume:   28.10 ml  16.02 ml/m AORTIC VALVE LVOT Vmax:   85.70 cm/s LVOT Vmean:  57.100 cm/s LVOT VTI:    0.164 m  AORTA Ao Root diam: 2.70 cm Ao Asc diam:  3.10 cm  MITRAL VALVE               TRICUSPID  VALVE MV Area (PHT): 3.66 cm    TR Peak grad:   19.7 mmHg MV Decel Time: 208 msec    TR Vmax:        222.00 cm/s MV E velocity: 44.65 cm/s MV A velocity: 70.60 cm/s  SHUNTS MV E/A ratio:  0.63        Systemic VTI:  0.16 m Systemic Diam: 2.00 cm  Dalton McleanMD Electronically signed by Ezra Kanner Signature Date/Time: 04/07/2023/12:22:07 PM    Final      CT SCANS  CT CORONARY MORPH W/CTA COR W/SCORE 03/15/2023  Addendum 04/02/2023 12:18 AM ADDENDUM REPORT: 04/02/2023 00:16  EXAM: OVER-READ INTERPRETATION  CT CHEST  The following report is an over-read performed by radiologist Dr. Suzen Dials of [facility] on 04/02/2023. This over-read does not include interpretation of cardiac or coronary anatomy or pathology. The coronary calcium  score/coronary CTA interpretation by the cardiologist is attached.  COMPARISON:  March 22, 2011  FINDINGS: Cardiovascular: There are no significant extracardiac vascular findings.  Mediastinum/Nodes: There are no enlarged lymph nodes within the visualized mediastinum.  Lungs/Pleura: There is no pleural effusion. The visualized lungs appear clear.  Upper abdomen: There is diffuse fatty infiltration of the liver parenchyma.  Musculoskeletal/Chest wall: No chest wall mass or suspicious osseous findings within the visualized chest.  IMPRESSION: No significant extracardiac findings within the visualized chest.   Electronically Signed By: Suzen Dials M.D. On: 04/02/2023 00:16  Narrative CLINICAL DATA:  This is a 63 year old female with anginal symptoms.  EXAM: Cardiac/Coronary  CTA  TECHNIQUE: The patient was scanned on a [employer].  FINDINGS: A 100 kV prospective scan was triggered in the descending thoracic aorta at 111 HU's.  Axial non-contrast 3 mm slices were carried out through the heart. The data set was analyzed on a dedicated work station and scored using the Agatson  method. Gantry rotation speed was 250 msecs and collimation was .6 mm. No beta blockade and 0.8 mg of sl NTG was given. The 3D data set was reconstructed in 5% intervals of the 67-82 % of the R-R cycle. Diastolic phases were analyzed on a dedicated work station using MPR, MIP and VRT modes. The patient received 80 cc of contrast.  Aorta: Normal size.  No calcifications.  No dissection.  Aortic Valve:  Trileaflet.  No calcifications.  Coronary Arteries:  Normal coronary origin.  Right dominance.  RCA is a large dominant artery that gives rise to PDA and PLA. There is diffuse mild (25-49%) calcified plaques through out the RCA.  Left main is a large artery that gives rise to LAD and LCX arteries. Left main artery with minimal focal calcified plaque in the distal portion of the vessel.  LAD is a large vessel. The proximal LAD with minimal diffuse plaques. The minimal focal calcified plaque with a minimal soft plaque right below. The distal LAD with no plaques.  LCX is a non-dominant artery that gives rise to one large OM1 branch. There is minimal (< 24%) focal calcified plaque in the proximal LCX. The mid and distal LCX with no plaques.  Coronary Calcium  Score:  Left main: 23.8  Left anterior descending artery: 256  Left circumflex artery: 17.2  Right coronary artery: 173  Total: 471  Percentile: 98  Other findings:  Normal pulmonary vein drainage into the left atrium.  Normal left atrial appendage without a thrombus.  Normal size of the pulmonary artery.  IMPRESSION: 1. Coronary calcium  score of 471. This was 62 percentile for age and sex matched control.  2. Normal coronary origin with right dominance.  3. CAD-RADS 2. Mild non-obstructive CAD (25-49%). Consider non-atherosclerotic causes of chest pain. Consider preventive therapy and risk factor modification.  4. Total plaque volume 542 mm3 which is 86th percentile for age- and sex-matched controls  (calcified plaque 74 mm3; non-calcified plaque 468 mm3).  The noncardiac portion of this study will be interpreted in separate report by the radiologist.  Electronically Signed: By: Kardie  Tobb D.O. On: 03/16/2023 09:46     ______________________________________________________________________________________________      Risk Assessment/Calculations          Physical Exam VS:  BP 124/77 (BP Location: Right Arm, Patient Position: Sitting, Cuff Size: Normal)   Pulse 81   Resp 16   Ht 5' (1.524 m)   Wt 175 lb 3.2 oz (79.5 kg)   SpO2 93%   BMI 34.22 kg/m        Wt Readings from Last 3 Encounters:  10/13/23 175 lb 3.2 oz (79.5 kg)  10/09/23 171 lb 12.8 oz (77.9 kg)  10/03/23 175 lb (79.4 kg)    GEN: Well nourished, well developed in no acute distress NECK: No JVD; No carotid bruits CARDIAC: RRR, no murmurs, rubs, gallops RESPIRATORY:  Clear to auscultation without rales, wheezing or rhonchi  ABDOMEN: Soft, non-tender, non-distended EXTREMITIES:  No edema; No deformity   ASSESSMENT AND PLAN  Preoperative clearance: Upcoming left reverse shoulder arthroplasty by Dr. Addie in July.  She has fairly recent coronary CT and echocardiogram in 2024, she denies any recent chest pain and has no problem achieving more than 4 metabolic level activity.  She is at acceptable risk to proceed from a cardiac perspective.  CAD: Last coronary CT in November 2024 demonstrated mild coronary artery disease.  She does have high calcium  score.  Will focus on preventative measure  Hypertension: Blood pressure well-controlled  Hyperlipidemia: On Zetia  and atorvastatin        Dispo: Follow-up with Dr. Nishan in 6 month  Signed, Aaylah Pokorny, [place]

## 2023-10-15 NOTE — Telephone Encounter (Signed)
   Patient Name: [name] [name]  DOB: 10/25/1960 MRN: [id number]  Primary Cardiologist: Maude Emmer, MD  Chart reviewed as part of pre-operative protocol coverage. Given past medical history and time since last visit, based on ACC/AHA guidelines, [name] [name] is at acceptable risk for the planned procedure without further cardiovascular testing.   The patient was advised that if she develops new symptoms prior to surgery to contact our office to arrange for a follow-up visit, and she verbalized understanding.  I will route this recommendation to the requesting party via Epic fax function and remove from pre-op pool.  Please call with questions.  Aveah Castell, [place] 10/15/2023, 10:08 PM

## 2023-10-18 ENCOUNTER — Other Ambulatory Visit (HOSPITAL_COMMUNITY): Payer: Self-pay

## 2023-10-18 ENCOUNTER — Other Ambulatory Visit: Payer: Self-pay

## 2023-10-19 ENCOUNTER — Other Ambulatory Visit: Payer: Self-pay

## 2023-10-19 ENCOUNTER — Telehealth: Payer: Self-pay

## 2023-10-19 NOTE — Progress Notes (Signed)
 Specialty Pharmacy Refill Coordination Note  [name] [name] is a 63 y.o. female contacted today regarding refills of specialty medication(s) Tezepelumab -ekko (TEZSPIRE )   Patient requested Courier to Provider Office   Delivery date: 10/23/23   Verified address: [facility] [address] [place],  [place] [address]   Medication will be filled on 10/20/23.

## 2023-10-19 NOTE — Telephone Encounter (Signed)
 [facility] Patient Advocate Encounter  Patient's medications CABENUVA  have been couriered to [facility] from [facility] and will be administered at the patients appointment on 10/24/23.  Charmaine Sharps, CPhT Specialty Pharmacy Patient [facility] Phone: [phone] Fax:  [phone]

## 2023-10-20 ENCOUNTER — Other Ambulatory Visit: Payer: Self-pay

## 2023-10-23 NOTE — Progress Notes (Unsigned)
 HPI: [name] [name] is a 63 y.o. female who presents to the [facility] pharmacy clinic for Cabenuva  administration.  Patient Active Problem List   Diagnosis Date Noted   CAD (coronary artery disease) 03/19/2023   Chest pain 01/30/2023   Type 2 diabetes mellitus without complication, without long-term current use of insulin  (HCC) 01/30/2023   Osteopenia after menopause 03/15/2022   Anxiety 03/15/2022   Impingement syndrome of left shoulder 07/01/2021   Osteoarthritis of thumb, right 11/29/2018   Chronic migraine w/o aura w/o status migrainosus, not intractable 08/27/2018   Central perforation of tympanic membrane of left ear 12/09/2016   Eustachian tube dysfunction, left 03/09/2016   Spondylosis of lumbar region without myelopathy or radiculopathy 10/08/2015   Liver fibrosis (HCC) 12/04/2014   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   Vitamin D  deficiency 06/29/2014   Secondary adrenal insufficiency (HCC) 06/29/2014   History of tympanostomy tube placement 02/07/2014   Hepatitis C virus infection cured after antiviral drug therapy 03/16/2012   Health care maintenance 12/15/2011   HTN (hypertension) 10/05/2010   Hyperlipidemia associated with type 2 diabetes mellitus (HCC) 10/23/2008   HIV disease (HCC) 05/09/2006   Allergic rhinitis 05/09/2006   GERD 05/09/2006    Patient's Medications  New Prescriptions   No medications on file  Previous Medications   ACCU-CHEK GUIDE TEST STRIP    use to check blood sugar In Vitro Once a day DX: E11.9 for 90 days   AMLODIPINE  (NORVASC ) 10 MG TABLET    Take 1 tablet (10 mg total) by mouth daily.   ATORVASTATIN  (LIPITOR) 10 MG TABLET    Take 0.5 tablets (5 mg total) by mouth daily.   BETAMETHASONE  VALERATE OINTMENT (VALISONE ) 0.1 %    Apply 1 Application topically 2 (two) times daily.   CABOTEGRAVIR  & RILPIVIRINE  ER (CABENUVA ) 600 & 900 MG/3ML INJECTION    Inject 1 kit into the muscle every 8  (eight) weeks.   CETIRIZINE  (ZYRTEC ) 10 MG TABLET    Take 2 tablets (20 mg total) by mouth 2 (two) times daily.   CHLORTHALIDONE (HYGROTON) 25 MG TABLET    Take 1 tablet (25 mg total) by mouth daily.   CHOLECALCIFEROL  (VITAMIN D3) 25 MCG (1000 UNIT) TABLET    Take 3 tablets (3,000 Units total) by mouth daily.   CIPROFLOXACIN -DEXAMETHASONE  (CIPRODEX ) OTIC SUSPENSION    Place 4 drops into the left ear as needed (ear infection).   DICLOFENAC  (VOLTAREN ) 50 MG EC TABLET    TAKE 1 TABLET (50 MG TOTAL) BY MOUTH TWO (TWO) TIMES DAILY   EZETIMIBE  (ZETIA ) 10 MG TABLET    Take 1 tablet (10 mg total) by mouth daily.   FAMOTIDINE  (PEPCID ) 40 MG TABLET    Take 1 tablet (40 mg total) by mouth at bedtime.   FEZOLINETANT  (VEOZAH ) 45 MG TABS    Take 1 tablet (45 mg total) by mouth daily.   GABAPENTIN  (NEURONTIN ) 400 MG CAPSULE    Take 1 capsule (400 mg total) by mouth at bedtime.   GLOBAL EASE INJECT PEN NEEDLES 32G X 4 MM MISC    USE AS DIRECTED WITH VICTOZA for 30   GLUCOSE BLOOD TEST STRIP    Use as instructed   HYDROCORTISONE  (CORTEF ) 5 MG TABLET    Take 2.5-5 mg by mouth See admin instructions. Take 1 tablet (5 mg) by mouth in the morning (scheduled) & may taken an additional 0.5 tablet (2.5 mg) by mouth in the evening if needed.  LIRAGLUTIDE (VICTOZA) 18 MG/3ML SOPN    Inject 1.8 mg into the skin every evening.   METOPROLOL  TARTRATE (LOPRESSOR ) 50 MG TABLET    Take 1 tablet (50 mg total) by mouth 2 (two) times daily.   MONTELUKAST  (SINGULAIR ) 10 MG TABLET    Take 1 tablet (10 mg total) by mouth at bedtime.   NEURONTIN  100 MG CAPSULE    Take 2 capsules (200 mg total) by mouth 2 (two) times daily. TAKE 1-2 CAPSULES EVERY 3-4 TIMES DAILY.   NITROGLYCERIN  (NITROSTAT ) 0.4 MG SL TABLET    Place 1 tablet (0.4 mg total) under the tongue every 5 (five) minutes as needed for chest pain.   NORTRIPTYLINE  (PAMELOR ) 10 MG CAPSULE    TAKE TWO CAPSULES BY MOUTH AT BEDTIME   ONDANSETRON  (ZOFRAN ) 4 MG TABLET    Take 1 tablet  (4 mg total) by mouth every 8 (eight) hours as needed for nausea or vomiting.   PANTOPRAZOLE  (PROTONIX ) 40 MG TABLET    Take 1 tablet (40 mg total) by mouth 2 (two) times daily.   POTASSIUM CHLORIDE  SA (KLOR-CON  M) 20 MEQ TABLET    Take 2 tablets (40 mEq total) by mouth daily.   PSYLLIUM (HYDROCIL/METAMUCIL) 95 % PACK    Take 1 packet by mouth daily.   SERTRALINE  (ZOLOFT ) 100 MG TABLET    Take 1 tablet (100 mg total) by mouth daily.   SODIUM CHLORIDE  (OCEAN) 0.65 % SOLN NASAL SPRAY    PLACE 1 SPRAY INTO BOTH NOSTRILS AS NEEDED FOR CONGESTION.   SUMATRIPTAN  (IMITREX ) 25 MG TABLET    Take 1 tablet (25 mg total) by mouth every 2 (two) hours as needed for migraine. May repeat in 2 hours if headache persists or recurs.   SYMBICORT  160-4.5 MCG/ACT INHALER    Inhale 2 puffs into the lungs in the morning and at bedtime.   TEZEPELUMAB -EKKO (TEZSPIRE ) 210 MG/1. SOAJ    Inject 210 mg into the skin every 28 (twenty-eight) days.   TRIAMCINOLONE  (NASACORT ) 55 MCG/ACT AERO NASAL INHALER    Place 1 spray into the nose 2 (two) times daily. 1 spray each nostril 2 times per day   VENTOLIN  HFA 108 (90 BASE) MCG/ACT INHALER    INHALE TWO PUFFS INTO THE LUNGS EVERY 4 (FOUR) HOURS AS NEEDED FOR WHEEZING OR SHORTNESS OF BREATH.  Modified Medications   No medications on file  Discontinued Medications   No medications on file    Allergies: Allergies  Allergen Reactions   Acetaminophen  Other (See Comments)    Inflamed liver, hospitalized  Other Reaction(s): liver demage   Morphine  Sulfate Hives and Shortness Of Breath   Triamterene Hives   Aspirin-Caffeine Diarrhea and Other (See Comments)    liver damage; upset stomach  Other Reaction(s): liver demage   Dyazide [Hydrochlorothiazide -Triamterene] Hives   Empagliflozin Diarrhea    (Jardiance) stomach ache   Metformin  Hcl Er Other (See Comments) and Nausea Only    upset stomach   Topamax  [Topiramate ] Other (See Comments)    Vision disturbances.    Citalopram  Itching, Rash and Other (See Comments)   Emtricitabine -Tenofovir  Df Rash    Descovy    Lisinopril  Other (See Comments)    Cough    Losartan  Nausea Only and Rash    Pt had rash, worsening dizziness, and nausea after starting losartan , which improved after stopping losartan    Triamterene-Hctz Rash    Labs: Lab Results  Component Value Date   HIV1RNAQUANT 21 (H) 08/24/2023   HIV1RNAQUANT Not Detected  04/25/2023   HIV1RNAQUANT Not Detected 03/06/2023   CD4TABS 518 08/24/2023   CD4TABS 516 03/06/2023   CD4TABS 487 02/08/2022    RPR and STI Lab Results  Component Value Date   LABRPR NON-REACTIVE 02/02/2021   LABRPR NON-REACTIVE 11/13/2019   LABRPR NON-REACTIVE 08/30/2018   LABRPR NON-REACTIVE 11/16/2017   LABRPR NON REAC 07/12/2016    STI Results GC CT  10/20/2022 10:51 AM Negative  Negative   02/02/2021 10:13 AM Negative  Negative   08/30/2018 12:00 AM Negative  Negative   01/11/2018 12:00 AM Negative  Negative   11/16/2017 12:00 AM Negative  Negative   03/25/2011  4:35 PM  NEGATIVE     Hepatitis B Lab Results  Component Value Date   HEPBSAB NEG 12/15/2011   HEPBSAG NEGATIVE 12/15/2011   Hepatitis C Lab Results  Component Value Date   HCVRNAPCRQN <15 NOT DETECTED 02/21/2017   Hepatitis A No results found for: HAV Lipids: Lab Results  Component Value Date   CHOL 153 08/14/2023   TRIG 206 (H) 08/14/2023   HDL 42 08/14/2023   CHOLHDL 3.6 08/14/2023   VLDL 44 (H) 07/12/2016   LDLCALC 77 08/14/2023    TARGET DATE: The 6th  Assessment: [name] [name] presents today for her maintenance Cabenuva  injections. Past injections were tolerated well without issues. Last HIV RNA was undetectable in May.  Recently started Veozah  for her night sweats and states that it has helped tremendously with her symptoms. She is quite happy with the results. Getting shoulder reconstructive surgery at the end of July. She also states that the steroid cream that [facility]  prescribed for her elbows isn't really helping but that she will wait until her dermatology appointment in September.   Administered cabotegravir  600mg /59mL in left upper outer quadrant of the gluteal muscle. Administered rilpivirine  900 mg/3mL in the right upper outer quadrant of the gluteal muscle. No issues with injections. She will follow up in 2 months for next set of injections.  Plan: - Cabenuva  injections administered - Next injections scheduled for 12/26/23 with me and 03/04/24 with Corean - Call with any issues or questions  Noelene Gang L. Aleysha Meckler, PharmD, BCIDP, AAHIVP, CPP Clinical Pharmacist Practitioner - Infectious Diseases Clinical Pharmacist Lead - Specialty Pharmacy [facility] 10/23/2023, 3:12 PM

## 2023-10-24 ENCOUNTER — Ambulatory Visit: Payer: Self-pay | Admitting: Pharmacist

## 2023-10-24 ENCOUNTER — Other Ambulatory Visit: Payer: Self-pay

## 2023-10-24 ENCOUNTER — Ambulatory Visit: Admitting: Infectious Diseases

## 2023-10-24 ENCOUNTER — Encounter: Payer: Self-pay | Admitting: Infectious Diseases

## 2023-10-24 DIAGNOSIS — B20 Human immunodeficiency virus [HIV] disease: Secondary | ICD-10-CM

## 2023-10-24 DIAGNOSIS — M810 Age-related osteoporosis without current pathological fracture: Secondary | ICD-10-CM | POA: Insufficient documentation

## 2023-10-24 MED ORDER — CABOTEGRAVIR & RILPIVIRINE ER 600 & 900 MG/3ML IM SUER
1.0000 | Freq: Once | INTRAMUSCULAR | Status: AC
Start: 2023-10-24 — End: 2023-10-24
  Administered 2023-10-24: 1 via INTRAMUSCULAR

## 2023-10-25 ENCOUNTER — Other Ambulatory Visit: Payer: Self-pay

## 2023-10-26 ENCOUNTER — Encounter

## 2023-10-26 ENCOUNTER — Other Ambulatory Visit: Payer: Self-pay

## 2023-10-26 NOTE — Patient Outreach (Signed)
 Complex Care Management   Visit Note  10/26/2023  Name:  [name] [name] MRN: [id number] DOB: Jul 24, 1960  Situation: Referral received for Complex Care Management related to HTN I obtained verbal consent from Patient.  Visit completed with patient  on the phone  Background:   Past Medical History:  Diagnosis Date   Allergic rhinitis 05/09/2006   Allergy     Anxiety    Arthritis    Asthma    CAD (coronary artery disease) 03/19/2023   CCTA 03/16/23: CAC score 471 (98th percentile), RCA diffuse 25-49, dLM minimal Ca2+ plaque, LAD prox minimal plaques, pLCx < 24; TPV 542 mm3 (86th percentile)    CHF (congestive heart failure) (HCC)    Chronic back pain    Chronic night sweats 08/23/2022   Diabetes mellitus without complication (HCC) 04/25/2014   GERD (gastroesophageal reflux disease)    Heart murmur    as a child   Hepatitis C    genotype 1b, 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 s/p 12 week course of simeprevir and sofosbuvir between October 2014 and January 2015 with resolution.   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 Additionally, she is scheduled to undergo a total left shoulder replacement on November 16, 2023, at 6:45 AM at [facility]. Patient Reported Symptoms:  Cognitive Cognitive Status: Able to follow simple commands, Alert and oriented to person, place, and time, Normal speech and  language skills      Neurological Neurological Review of Symptoms: Dizziness (From vertigo) Neurological Management Strategies:  (She will sit down)  HEENT   HEENT Management Strategies: Medical device HEENT Comment: hearing aid in the left ear Ear problem(s)  Cardiovascular Cardiovascular Symptoms Reported: Chest pain or discomfort Does patient have uncontrolled Hypertension?: Yes Is patient checking Blood Pressure at home?: No Patient's Recent BP reading at home: 6/23 120/76 6/28 115/77 Cardiovascular Management Strategies: Medication therapy, Medical device  Respiratory Respiratory Symptoms Reported: Shortness of breath Other Respiratory Symptoms: with exertion and heat Additional Respiratory Details: Uses inhaler Respiratory Management Strategies: Medication therapy  Endocrine Endocrine Symptoms Reported: No symptoms reported Is patient diabetic?: Yes Is patient checking blood sugars at home?: Yes List most recent blood sugar readings, include date and time of day: 112 fasting    Gastrointestinal Gastrointestinal Symptoms Reported: No symptoms reported      Genitourinary Genitourinary Symptoms Reported: No symptoms reported    Integumentary   Skin Management Strategies: Medication therapy Skin Comment: Uses cream  to help  Musculoskeletal Musculoskelatal Symptoms Reviewed: Weakness, Unsteady gait, Difficulty walking Musculoskeletal Management Strategies: Medical device, Medication therapy, Exercise Falls in the past year?: No    Psychosocial       Quality of Family Relationships: supportive Do you feel physically threatened by others?: No      10/25/2023    2:47 PM  Depression screen  PHQ 2/9  Decreased Interest 0  Down, Depressed, Hopeless 0  PHQ - 2 Score 0    There were no vitals filed for this visit.  Medications Reviewed Today     Reviewed by Weyman Corning, RN (Registered Nurse) on 10/25/23 at 1430  Med List Status: <None>   Medication Order Taking? Sig  Documenting Provider Last Dose Status Informant  ACCU-CHEK GUIDE test strip [id number] Yes use to check blood sugar In Vitro Once a day DX: E11.9 for 90 days [provider]  Active   amLODipine  (NORVASC ) 10 MG tablet [id number] Yes Take 1 tablet (10 mg total) by mouth daily. Gomez-Caraballo, Maria, MD  Active   atorvastatin  (LIPITOR) 10 MG tablet [id number] Yes Take 0.5 tablets (5 mg total) by mouth daily. Gomez-Caraballo, Maria, MD  Active   betamethasone  valerate ointment (VALISONE ) 0.1 % [id number] Yes Apply 1 Application topically 2 (two) times daily. Melvenia Corean SAILOR, NP  Active   cabotegravir  & rilpivirine  ER (CABENUVA ) 600 & 900 MG/3ML injection [id number] Yes Inject 1 kit into the muscle every 8 (eight) weeks. Melvenia Corean SAILOR, NP  Active   cetirizine  (ZYRTEC ) 10 MG tablet [id number] Yes Take 2 tablets (20 mg total) by mouth 2 (two) times daily. Kozlow, Eric J, MD  Active   chlorthalidone (HYGROTON) 25 MG tablet [id number] Yes Take 1 tablet (25 mg total) by mouth daily. Tobie Gaines, DO  Active   cholecalciferol  (VITAMIN D3) 25 MCG (1000 UNIT) tablet [id number] Yes Take 3 tablets (3,000 Units total) by mouth daily. Tobie Gaines, DO  Active   ciprofloxacin -dexamethasone  (CIPRODEX ) OTIC suspension [id number] Yes Place 4 drops into the left ear as needed (ear infection). [provider]  Active   diclofenac  (VOLTAREN ) 50 MG EC tablet [id number] Yes TAKE 1 TABLET (50 MG TOTAL) BY MOUTH TWO (TWO) TIMES DAILY Magnant, Charles L, PA-C  Active   ezetimibe  (ZETIA ) 10 MG tablet [id number] Yes Take 1 tablet (10 mg total) by mouth daily. Gomez-Caraballo, Maria, MD  Active   famotidine  (PEPCID ) 40 MG tablet [id number] Yes Take 1 tablet (40 mg total) by mouth at bedtime. Elnora Ip, MD  Active   Fezolinetant  (VEOZAH ) 45 MG TABS [id number] Yes Take 1 tablet (45 mg total) by mouth daily. Melvenia Corean SAILOR, NP  Active   gabapentin  (NEURONTIN ) 400 MG capsule [id number] Yes Take 1 capsule (400  mg total) by mouth at bedtime. Kozlow, Camellia PARAS, MD  Active   GLOBAL EASE INJECT PEN NEEDLES 32G X 4 MM MISC [id number] Yes USE AS DIRECTED WITH VICTOZA for 30 [provider]  Active   glucose blood test strip [id number] Yes Use as instructed Tobie Gaines, DO  Active   hydrocortisone  (CORTEF ) 5 MG tablet [id number] Yes Take 2.5-5 mg by mouth See admin instructions. Take 1 tablet (5 mg) by mouth in the morning (scheduled) & may taken an additional 0.5 tablet (2.5 mg) by mouth in the evening if needed. [provider]  Active Self           Med Note STEPHEN OLIVIA Kitchens Feb 27, 2023  3:23 PM)    liraglutide (VICTOZA) 18 MG/3ML SOPN [id number] Yes Inject 1.8 mg into the skin every evening. [provider]  Active Self  metoprolol  tartrate (LOPRESSOR ) 50 MG tablet [id number] Yes Take 1 tablet (50 mg total) by mouth 2 (two) times daily. Gomez-Caraballo, Maria, MD  Active   montelukast  (SINGULAIR ) 10 MG tablet [id number] Yes Take 1 tablet (10 mg total) by mouth at  bedtime. Kozlow, Eric J, MD  Active   NEURONTIN  100 MG capsule [id number] Yes Take 2 capsules (200 mg total) by mouth 2 (two) times daily. TAKE 1-2 CAPSULES EVERY 3-4 TIMES DAILY. Kozlow, Camellia PARAS, MD  Active   nitroGLYCERIN  (NITROSTAT ) 0.4 MG SL tablet [id number] Yes Place 1 tablet (0.4 mg total) under the tongue every 5 (five) minutes as needed for chest pain. Lou Claretta HERO, MD  Active   nortriptyline  (PAMELOR ) 10 MG capsule [id number] Yes TAKE TWO CAPSULES BY MOUTH AT BEDTIME Onita Duos, MD  Active   ondansetron  (ZOFRAN ) 4 MG tablet [id number] Yes Take 1 tablet (4 mg total) by mouth every 8 (eight) hours as needed for nausea or vomiting. Stephanie Freund, MD  Active   pantoprazole  (PROTONIX ) 40 MG tablet [id number] Yes Take 1 tablet (40 mg total) by mouth 2 (two) times daily. Gomez-Caraballo, Maria, MD  Active   potassium chloride  SA (KLOR-CON  M) 20 MEQ tablet [id number] Yes Take 2 tablets (40 mEq total) by mouth daily. Lelon Hamilton T, PA-C  Active   psyllium (HYDROCIL/METAMUCIL) 95 % PACK [id number] Yes Take 1 packet by mouth daily. [provider]  Active Self  sertraline  (ZOLOFT ) 100 MG tablet [id number] Yes Take 1 tablet (100 mg total) by mouth daily. Gomez-Caraballo, Maria, MD  Active   sodium chloride  (OCEAN) 0.65 % SOLN nasal spray [id number] Yes PLACE 1 SPRAY INTO BOTH NOSTRILS AS NEEDED FOR CONGESTION. Rosan Harlene Fickle, DO  Active Self  SUMAtriptan  (IMITREX ) 25 MG tablet [id number] Yes Take 1 tablet (25 mg total) by mouth every 2 (two) hours as needed for migraine. May repeat in 2 hours if headache persists or recurs. Onita Duos, MD  Active Self  SYMBICORT  160-4.5 MCG/ACT inhaler [id number] Yes Inhale 2 puffs into the lungs in the morning and at bedtime. Kozlow, Camellia PARAS, MD  Active   Tezepelumab -ekko (TEZSPIRE ) 210 MG/1. SOAJ [id number] Yes Inject 210 mg into the skin every 28 (twenty-eight) days. Kozlow, Eric J, MD  Active   tezepelumab -ekko (TEZSPIRE ) 210 MG/1. syringe 210 mg [id number]   Kozlow, Eric J, MD  Active   triamcinolone  (NASACORT ) 55 MCG/ACT AERO nasal inhaler [id number] Yes Place 1 spray into the nose 2 (two) times daily. 1 spray each nostril 2 times per day Maurilio Camellia PARAS, MD  Active   VENTOLIN  HFA 108 (90 Base) MCG/ACT inhaler [id number] Yes INHALE TWO PUFFS INTO THE LUNGS EVERY 4 (FOUR) HOURS AS NEEDED FOR WHEEZING OR SHORTNESS OF BREATH. Kozlow, Camellia PARAS, MD  Active             Recommendation:   PCP Follow-up  Follow Up Plan:   Telephone follow up appointment date/time:  11/23/23  [name] RN, BSN, [facility]  [facility], [facility]    Care Coordinator Phone: [phone]

## 2023-10-27 NOTE — Patient Instructions (Signed)
 Visit Information  [name] was given information about Medicaid Managed Care team care coordination services as a part of their [facility] Community Plan Medicaid benefit. [name] [name] Bolus verbally consented to engagement with the [facility] Managed Care team.   If you are experiencing a medical emergency, please call 911 or report to your local emergency department or urgent care.   If you have a non-emergency medical problem during routine business hours, please contact your provider's office and ask to speak with a nurse.   For questions related to your [facility], please call: [phone] or visit the homepage here: [email or web address]  If you would like to schedule transportation through your [facility], please call the following number at least 2 days in advance of your appointment: [phone]   Rides for urgent appointments can also be made after hours by calling Member Services.  Call the Behavioral Health Crisis Line at [phone], at any time, 24 hours a day, 7 days a week. If you are in danger or need immediate medical attention call 911.  If you would like help to quit smoking, call 1-800-QUIT-NOW (641-672-9014) OR Espaol: 1-855-Djelo-Ya (8-144-664-6430) o para ms informacin haga clic aqu or Text READY to 799-599 to register via text  [name] [name] - following are the goals we discussed in your visit today:   Goals Addressed             This Visit's Progress    VBCI RN Care Plan       Problems:  Chronic Disease Management support and education needs related to HTN  Goal: Over the next 30 days the Patient will attend all scheduled medical appointments: with PCP and specialist as evidenced by keeping al scheduled appointments        demonstrate Ongoing adherence to prescribed treatment plan for HTN as evidenced by no admissions to the hospital verbalize  basic understanding of HTN disease process and self health management plan as evidenced by verbal explanation lifestyle changes and consistent medication compliance   Interventions:   Hypertension Interventions: Last practice recorded BP readings:  BP Readings from Last 3 Encounters:  10/13/23 124/77  10/09/23 138/83  10/03/23 124/81   Most recent eGFR/CrCl:  Lab Results  Component Value Date   EGFR 76 08/14/2023    No components found for: CRCL  Evaluation of current treatment plan related to hypertension self management and patient's adherence to plan as established by provider Reviewed medications with patient and discussed importance of compliance Discussed plans with patient for ongoing care management follow up and provided patient with direct contact information for care management team Advised patient, providing education and rationale, to monitor blood pressure daily and record, calling PCP for findings outside established parameters Reviewed scheduled/upcoming provider appointments including:  Screening for signs and symptoms of depression related to chronic disease state  Assessed social determinant of health barriers Check BP once daily and write the values  Patient Self-Care Activities:  Attend all scheduled provider appointments Call pharmacy for medication refills 3-7 days in advance of running out of medications Call provider office for new concerns or questions  Take medications as prescribed   check blood pressure daily keep a blood pressure log keep all doctor appointments  Plan:  Telephone follow up appointment with care management team member scheduled for:  11/23/23  1015 am             Please see education materials related to HTN provided by MyChart  link.  Patient verbalizes understanding of instructions and care plan provided today and agrees to view in MyChart. Active MyChart status and patient understanding of how to access instructions and  care plan via MyChart confirmed with patient.     Telephone follow up appointment with care management team member scheduled for:11/23/23  1015 am  [name] RN, BSN, [facility]  [facility], [facility]    Care Coordinator Phone: [phone]     Following is a copy of your plan of care:  There are no care plans that you recently modified to display for this patient.

## 2023-10-30 ENCOUNTER — Ambulatory Visit

## 2023-10-30 ENCOUNTER — Other Ambulatory Visit: Payer: Self-pay

## 2023-10-30 ENCOUNTER — Other Ambulatory Visit (HOSPITAL_COMMUNITY): Payer: Self-pay

## 2023-10-30 DIAGNOSIS — J4550 Severe persistent asthma, uncomplicated: Secondary | ICD-10-CM

## 2023-11-06 ENCOUNTER — Other Ambulatory Visit: Payer: Self-pay

## 2023-11-07 ENCOUNTER — Encounter (HOSPITAL_COMMUNITY): Payer: Self-pay

## 2023-11-07 NOTE — Pre-Procedure Instructions (Signed)
 Surgical Instructions   Your procedure is scheduled on November 16, 2023. Report to [facility] Main Entrance A at 7:00 A.M., then check in with the Admitting office. Any questions or running late day of surgery: call [phone]  Questions prior to your surgery date: call [phone], Monday-Friday, 8am-4pm. If you experience any cold or flu symptoms such as cough, fever, chills, shortness of breath, etc. between now and your scheduled surgery, please notify us  at the above number.     Remember:  Do not eat after midnight the night before your surgery  You may drink clear liquids until 6:00 AM the morning of your surgery.   Clear liquids allowed are: Water , Non-Citrus Juices (without pulp), Carbonated Beverages, Clear Tea (no milk, honey, etc.), Black Coffee Only (NO MILK, CREAM OR POWDERED CREAMER of any kind), and Gatorade.  Patient Instructions  The night before surgery:  No food after midnight. ONLY clear liquids after midnight  The day of surgery (if you have diabetes): Drink ONE (1) 12 oz G2 given to you in your pre admission testing appointment by 6:00 AM the morning of surgery. Drink in one sitting. Do not sip.  This drink was given to you during your hospital  pre-op appointment visit.  Nothing else to drink after completing the  12 oz bottle of G2.         If you have questions, please contact your surgeon's office.   Take these medicines the morning of surgery with A SIP OF WATER : amLODipine  (NORVASC )  cetirizine  (ZYRTEC )  Fezolinetant  (VEOZAH )  gabapentin  (NEURONTIN )  hydrocortisone  (CORTEF )  metoprolol  tartrate (LOPRESSOR )  pantoprazole  (PROTONIX )  sertraline  (ZOLOFT )  sodium chloride  (OCEAN) nasal spray  SYMBICORT  inhaler  triamcinolone  (NASACORT ) nasal inhaler    May take these medicines IF NEEDED: nitroGLYCERIN  (NITROSTAT ) - if dose taken prior to surgery, please call [phone] ondansetron  (ZOFRAN )  SUMAtriptan  (IMITREX )  VENTOLIN  HFA 108 (90 Base)  inhaler - please bring inhaler with you morning of surgery   One week prior to surgery, STOP taking any Aspirin (unless otherwise instructed by your surgeon) Aleve , Naproxen , Ibuprofen , Motrin , Advil , Goody's, BC's, all herbal medications, fish oil, and non-prescription vitamins.   WHAT DO I DO ABOUT MY DIABETES MEDICATION?   STOP taking your liraglutide (VICTOZA) 24 hours prior to surgery. DO NOT take any doses after July 23rd at 7:00am.   HOW TO MANAGE YOUR DIABETES BEFORE AND AFTER SURGERY  Why is it important to control my blood sugar before and after surgery? Improving blood sugar levels before and after surgery helps healing and can limit problems. A way of improving blood sugar control is eating a healthy diet by:  Eating less sugar and carbohydrates  Increasing activity/exercise  Talking with your doctor about reaching your blood sugar goals High blood sugars (greater than 180 mg/dL) can raise your risk of infections and slow your recovery, so you will need to focus on controlling your diabetes during the weeks before surgery. Make sure that the doctor who takes care of your diabetes knows about your planned surgery including the date and location.  How do I manage my blood sugar before surgery? Check your blood sugar at least 4 times a day, starting 2 days before surgery, to make sure that the level is not too high or low.  Check your blood sugar the morning of your surgery when you wake up and every 2 hours until you get to the Short Stay unit.  If your blood sugar is less than 70  mg/dL, you will need to treat for low blood sugar: Do not take insulin . Treat a low blood sugar (less than 70 mg/dL) with  cup of clear juice (cranberry or apple), 4 glucose tablets, OR glucose gel. Recheck blood sugar in 15 minutes after treatment (to make sure it is greater than 70 mg/dL). If your blood sugar is not greater than 70 mg/dL on recheck, call [phone] for further  instructions. Report your blood sugar to the short stay nurse when you get to Short Stay.  If you are admitted to the hospital after surgery: Your blood sugar will be checked by the staff and you will probably be given insulin  after surgery (instead of oral diabetes medicines) to make sure you have good blood sugar levels. The goal for blood sugar control after surgery is 80-180 mg/dL.                      Do NOT Smoke (Tobacco/Vaping) for 24 hours prior to your procedure.  If you use a CPAP at night, you may bring your mask/headgear for your overnight stay.   You will be asked to remove any contacts, glasses, piercing's, hearing aid's, dentures/partials prior to surgery. Please bring cases for these items if needed.    Patients discharged the day of surgery will not be allowed to drive home, and someone needs to stay with them for 24 hours.  SURGICAL WAITING ROOM VISITATION Patients may have no more than 2 support people in the waiting area - these visitors may rotate.   Pre-op nurse will coordinate an appropriate time for 1 ADULT support person, who may not rotate, to accompany patient in pre-op.  Children under the age of 48 must have an adult with them who is not the patient and must remain in the main waiting area with an adult.  If the patient needs to stay at the hospital during part of their recovery, the visitor guidelines for inpatient rooms apply.  Please refer to the [facility] website for the visitor guidelines for any additional information.   If you received a COVID test during your pre-op visit  it is requested that you wear a mask when out in public, stay away from anyone that may not be feeling well and notify your surgeon if you develop symptoms. If you have been in contact with anyone that has tested positive in the last 10 days please notify you surgeon.      Pre-operative 5 CHG Bathing Instructions   You can play a key role in reducing the risk of infection  after surgery. Your skin needs to be as free of germs as possible. You can reduce the number of germs on your skin by washing with CHG (chlorhexidine  gluconate) soap before surgery. CHG is an antiseptic soap that kills germs and continues to kill germs even after washing.   DO NOT use if you have an allergy  to chlorhexidine /CHG or antibacterial soaps. If your skin becomes reddened or irritated, stop using the CHG and notify one of our RNs at [phone].   Please shower with the CHG soap starting 4 days before surgery using the following schedule:     Please keep in mind the following:  DO NOT shave, including legs and underarms, starting the day of your first shower.   You may shave your face at any point before/day of surgery.  Place clean sheets on your bed the day you start using CHG soap. Use a clean washcloth (not  used since being washed) for each shower. DO NOT sleep with pets once you start using the CHG.   CHG Shower Instructions:  Wash your face and private area with normal soap. If you choose to wash your hair, wash first with your normal shampoo.  After you use shampoo/soap, rinse your hair and body thoroughly to remove shampoo/soap residue.  Turn the water  OFF and apply about 3 tablespoons (45 ml) of CHG soap to a CLEAN washcloth.  Apply CHG soap ONLY FROM YOUR NECK DOWN TO YOUR TOES (washing for 3-5 minutes)  DO NOT use CHG soap on face, private areas, open wounds, or sores.  Pay special attention to the area where your surgery is being performed.  If you are having back surgery, having someone wash your back for you may be helpful. Wait 2 minutes after CHG soap is applied, then you may rinse off the CHG soap.  Pat dry with a clean towel  Put on clean clothes/pajamas   If you choose to wear lotion, please use ONLY the CHG-compatible lotions that are listed below.  Additional instructions for the day of surgery: DO NOT APPLY any lotions, deodorants, cologne, or perfumes.    Do not bring valuables to the hospital. [facility] is not responsible for any belongings/valuables. Do not wear nail polish, gel polish, artificial nails, or any other type of covering on natural nails (fingers and toes) Do not wear jewelry or makeup Put on clean/comfortable clothes.  Please brush your teeth.  Ask your nurse before applying any prescription medications to the skin.     CHG Compatible Lotions   Aveeno Moisturizing lotion  Cetaphil Moisturizing Cream  Cetaphil Moisturizing Lotion  Clairol Herbal Essence Moisturizing Lotion, Dry Skin  Clairol Herbal Essence Moisturizing Lotion, Extra Dry Skin  Clairol Herbal Essence Moisturizing Lotion, Normal Skin  Curel Age Defying Therapeutic Moisturizing Lotion with Alpha Hydroxy  Curel Extreme Care Body Lotion  Curel Soothing Hands Moisturizing Hand Lotion  Curel Therapeutic Moisturizing Cream, Fragrance-Free  Curel Therapeutic Moisturizing Lotion, Fragrance-Free  Curel Therapeutic Moisturizing Lotion, Original Formula  Eucerin Daily Replenishing Lotion  Eucerin Dry Skin Therapy Plus Alpha Hydroxy Crme  Eucerin Dry Skin Therapy Plus Alpha Hydroxy Lotion  Eucerin Original Crme  Eucerin Original Lotion  Eucerin Plus Crme Eucerin Plus Lotion  Eucerin TriLipid Replenishing Lotion  Keri Anti-Bacterial Hand Lotion  Keri Deep Conditioning Original Lotion Dry Skin Formula Softly Scented  Keri Deep Conditioning Original Lotion, Fragrance Free Sensitive Skin Formula  Keri Lotion Fast Absorbing Fragrance Free Sensitive Skin Formula  Keri Lotion Fast Absorbing Softly Scented Dry Skin Formula  Keri Original Lotion  Keri Skin Renewal Lotion Keri Silky Smooth Lotion  Keri Silky Smooth Sensitive Skin Lotion  Nivea Body Creamy Conditioning Oil  Nivea Body Extra Enriched Lotion  Nivea Body Original Lotion  Nivea Body Sheer Moisturizing Lotion Nivea Crme  Nivea Skin Firming Lotion  NutraDerm 30 Skin Lotion  NutraDerm Skin Lotion   NutraDerm Therapeutic Skin Cream  NutraDerm Therapeutic Skin Lotion  ProShield Protective Hand Cream  Provon moisturizing lotion   [facility]- Preparing for Total Shoulder Arthroplasty  Before surgery, you can play an important role. Because skin is not sterile, your skin needs to be as free of germs as possible. You can reduce the number of germs on your skin by using the following products.   Benzoyl Peroxide Gel  o Reduces the number of germs present on the skin  o Applied twice a day to shoulder area starting  two days before surgery   Chlorhexidine  Gluconate (CHG) Soap (instructions listed above on how to wash with CHG Soap)  o An antiseptic cleaner that kills germs and bonds with the skin to continue killing germs even after washing  o Used for showering the night before surgery and morning of surgery   ==================================================================  Please follow these instructions carefully:  BENZOYL PEROXIDE 5% GEL  Please do not use if you have an allergy  to benzoyl peroxide. If your skin becomes reddened/irritated stop using the benzoyl peroxide.  Starting two days before surgery, apply as follows:  1. Apply benzoyl peroxide in the morning and at night. Apply after taking a shower. If you are not taking a shower clean entire shoulder front, back, and side along with the armpit with a clean wet washcloth.  2. Place a quarter-sized dollop on your SHOULDER and rub in thoroughly, making sure to cover the front, back, and side of your shoulder, along with the armpit.   2 Days prior to Surgery First Dose on _____________ Morning Second Dose on ______________ Night  Day Before Surgery First Dose on ______________ Morning Night before surgery wash (entire body except face and private areas) with CHG Soap THEN Second Dose on ____________ Night   Morning of Surgery  wash BODY AGAIN with CHG Soap   4. Do NOT apply benzoyl peroxide gel on the day  of surgery

## 2023-11-08 ENCOUNTER — Encounter: Payer: Self-pay | Admitting: Student

## 2023-11-08 ENCOUNTER — Encounter (HOSPITAL_COMMUNITY)
Admission: RE | Admit: 2023-11-08 | Discharge: 2023-11-08 | Disposition: A | Discharge status: Home / Self Care | Source: Ambulatory Visit | Attending: Orthopedic Surgery | Admitting: Orthopedic Surgery

## 2023-11-08 ENCOUNTER — Ambulatory Visit: Admitting: Student

## 2023-11-08 ENCOUNTER — Other Ambulatory Visit: Payer: Self-pay

## 2023-11-08 ENCOUNTER — Encounter (HOSPITAL_COMMUNITY): Payer: Self-pay

## 2023-11-08 VITALS — BP 131/82 | HR 80 | Temp 98.5°F | Resp 17 | Ht 61.0 in | Wt 172.5 lb

## 2023-11-08 VITALS — BP 128/66 | HR 76 | Temp 98.0°F | Ht 61.0 in | Wt 172.8 lb

## 2023-11-08 DIAGNOSIS — Z8619 Personal history of other infectious and parasitic diseases: Secondary | ICD-10-CM | POA: Insufficient documentation

## 2023-11-08 DIAGNOSIS — M19012 Primary osteoarthritis, left shoulder: Secondary | ICD-10-CM | POA: Diagnosis not present

## 2023-11-08 DIAGNOSIS — E119 Type 2 diabetes mellitus without complications: Secondary | ICD-10-CM | POA: Insufficient documentation

## 2023-11-08 DIAGNOSIS — K219 Gastro-esophageal reflux disease without esophagitis: Secondary | ICD-10-CM | POA: Diagnosis not present

## 2023-11-08 DIAGNOSIS — Z87891 Personal history of nicotine dependence: Secondary | ICD-10-CM | POA: Insufficient documentation

## 2023-11-08 DIAGNOSIS — Z21 Asymptomatic human immunodeficiency virus [HIV] infection status: Secondary | ICD-10-CM | POA: Diagnosis not present

## 2023-11-08 DIAGNOSIS — J45909 Unspecified asthma, uncomplicated: Secondary | ICD-10-CM | POA: Insufficient documentation

## 2023-11-08 DIAGNOSIS — E559 Vitamin D deficiency, unspecified: Secondary | ICD-10-CM | POA: Diagnosis not present

## 2023-11-08 DIAGNOSIS — I509 Heart failure, unspecified: Secondary | ICD-10-CM | POA: Diagnosis not present

## 2023-11-08 DIAGNOSIS — I110 Hypertensive heart disease with heart failure: Secondary | ICD-10-CM | POA: Insufficient documentation

## 2023-11-08 DIAGNOSIS — I10 Essential (primary) hypertension: Secondary | ICD-10-CM | POA: Diagnosis present

## 2023-11-08 DIAGNOSIS — Z01812 Encounter for preprocedural laboratory examination: Secondary | ICD-10-CM | POA: Diagnosis present

## 2023-11-08 DIAGNOSIS — E785 Hyperlipidemia, unspecified: Secondary | ICD-10-CM | POA: Diagnosis not present

## 2023-11-08 DIAGNOSIS — Z01818 Encounter for other preprocedural examination: Secondary | ICD-10-CM

## 2023-11-08 DIAGNOSIS — I2510 Atherosclerotic heart disease of native coronary artery without angina pectoris: Secondary | ICD-10-CM | POA: Diagnosis not present

## 2023-11-08 DIAGNOSIS — Z0000 Encounter for general adult medical examination without abnormal findings: Secondary | ICD-10-CM

## 2023-11-08 DIAGNOSIS — G8929 Other chronic pain: Secondary | ICD-10-CM | POA: Insufficient documentation

## 2023-11-08 HISTORY — DX: Disease of blood and blood-forming organs, unspecified: D75.9

## 2023-11-08 HISTORY — DX: Primary adrenocortical insufficiency: E27.1

## 2023-11-08 LAB — CBC
HCT: 45 % (ref 36.0–46.0)
Hemoglobin: 14.7 g/dL (ref 12.0–15.0)
MCH: 26.8 pg (ref 26.0–34.0)
MCHC: 32.7 g/dL (ref 30.0–36.0)
MCV: 82.1 fL (ref 80.0–100.0)
Platelets: 255 K/uL (ref 150–400)
RBC: 5.48 MIL/uL — ABNORMAL HIGH (ref 3.87–5.11)
RDW: 15.2 % (ref 11.5–15.5)
WBC: 8.6 K/uL (ref 4.0–10.5)
nRBC: 0 % (ref 0.0–0.2)

## 2023-11-08 LAB — URINALYSIS, W/ REFLEX TO CULTURE (INFECTION SUSPECTED)
Bacteria, UA: NONE SEEN
Bilirubin Urine: NEGATIVE
Glucose, UA: NEGATIVE mg/dL
Hgb urine dipstick: NEGATIVE
Ketones, ur: NEGATIVE mg/dL
Leukocytes,Ua: NEGATIVE
Nitrite: NEGATIVE
Protein, ur: NEGATIVE mg/dL
Specific Gravity, Urine: 1.016 (ref 1.005–1.030)
pH: 5 (ref 5.0–8.0)

## 2023-11-08 LAB — HEMOGLOBIN A1C
Hgb A1c MFr Bld: 6.3 % — ABNORMAL HIGH (ref 4.8–5.6)
Mean Plasma Glucose: 134.11 mg/dL

## 2023-11-08 LAB — BASIC METABOLIC PANEL WITH GFR
Anion gap: 13 (ref 5–15)
BUN: 15 mg/dL (ref 8–23)
CO2: 27 mmol/L (ref 22–32)
Calcium: 9.5 mg/dL (ref 8.9–10.3)
Chloride: 98 mmol/L (ref 98–111)
Creatinine, Ser: 1.03 mg/dL — ABNORMAL HIGH (ref 0.44–1.00)
GFR, Estimated: >60 mL/min (ref >60)
Glucose, Bld: 209 mg/dL — ABNORMAL HIGH (ref 70–99)
Potassium: 3 mmol/L — ABNORMAL LOW (ref 3.5–5.1)
Sodium: 138 mmol/L (ref 135–145)

## 2023-11-08 LAB — GLUCOSE, CAPILLARY: Glucose-Capillary: 175 mg/dL — ABNORMAL HIGH (ref 70–99)

## 2023-11-08 LAB — SURGICAL PCR SCREEN
MRSA, PCR: NEGATIVE
Staphylococcus aureus: POSITIVE — AB

## 2023-11-08 LAB — NO BLOOD PRODUCTS

## 2023-11-08 MED ORDER — VITAMIN D 25 MCG (1000 UNIT) PO TABS: 3000.0000 [IU] | ORAL_TABLET | Freq: Every day | ORAL | 3 refills | Status: AC

## 2023-11-08 NOTE — Progress Notes (Signed)
 PCP - Dr. Hadassah Ala Cardiologist - Dr. Maude Emmer - last office visit 02/27/2023  PPM/ICD - Denies Device Orders - n/a Rep Notified - n/a  Chest x-ray - n/a EKG - 10/13/2023 Stress Test - 07/27/2020 ECHO - 04/07/2023 Cardiac Cath - Denies  Sleep Study - Denies CPAP - n/a  Pt is DM2. She checks blood sugar 1-2x/week. Normal fasting is 110-120s. CBG at pre-op appointment 175. A1c result pending.  Last dose of GLP1 agonist- Last dose of Victoza will be today, July 16th. GLP1 instructions: Pt instructed to not take any doses after 0700 on July 23rd  Blood Thinner Instructions: n/a Aspirin Instructions: n/a  ERAS Protcol - Clear liquids until 0600 morning of surgery PRE-SURGERY Ensure or G2- G2 given to pt with instructions  COVID TEST- n/a   Anesthesia review: Yes. Cardiac clearance (HTN, CAD, CHF), DM2, Addison's Diease.   Patient denies shortness of breath, fever, cough and chest pain at PAT appointment. Pt denies any respiratory illness/infection in the last two months.   All instructions explained to the patient, with a verbal understanding of the material. Patient agrees to go over the instructions while at home for a better understanding. Patient also instructed to self quarantine after being tested for COVID-19. The opportunity to ask questions was provided.

## 2023-11-08 NOTE — Assessment & Plan Note (Signed)
 Refilled her vitamin D  3000U daily. She does have osteoporosis diagnosed. Last checked one month ago was 37, low normal.

## 2023-11-08 NOTE — Patient Instructions (Signed)
 Thank you so much for coming to the clinic today!   Please continue taking your medications, I'm glad you're doing so well! I have refilled your vitamin D   If you have any questions please feel free to the call the clinic at anytime at [phone]. It was a pleasure seeing you!  Best, Dr. Flossie Wexler

## 2023-11-08 NOTE — Progress Notes (Signed)
 CC: HTN f/u  HPI:  [name] is a 63 y.o. female living with a history stated below and presents today for follow up regarding her hypertension. Please see problem based assessment and plan for additional details.  Past Medical History:  Diagnosis Date   Addison's disease (HCC)    Allergic rhinitis 05/09/2006   Allergy     Anxiety    Arthritis    Asthma    Blood dyscrasia    HIV   CAD (coronary artery disease) 03/19/2023   CCTA 03/16/23: CAC score 471 (98th percentile), RCA diffuse 25-49, dLM minimal Ca2+ plaque, LAD prox minimal plaques, pLCx < 24; TPV 542 mm3 (86th percentile)    CHF (congestive heart failure) (HCC)    Chronic back pain    Chronic night sweats 08/23/2022   Depression    Diabetes mellitus without complication (HCC) 04/25/2014   GERD (gastroesophageal reflux disease)    Heart murmur    as a child   Hepatitis C    genotype 1b, 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 s/p 12 week course of simeprevir and sofosbuvir between October 2014 and January 2015 with resolution.   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 90 tablet 3   atorvastatin  (LIPITOR) 10 MG tablet Take 0.5 tablets (5 mg total) by mouth daily. (Patient taking differently: Take 5 mg by mouth at bedtime.) 45  each 10   betamethasone  valerate ointment (VALISONE ) 0.1 % Apply 1 Application topically 2 (two) times daily. 30 g 0   cabotegravir  & rilpivirine  ER (CABENUVA ) 600 & 900 MG/3ML injection Inject 1 kit into the muscle every 8 (eight) weeks. 6 mL 6   cetirizine  (ZYRTEC ) 10 MG tablet Take 2 tablets (20 mg total) by mouth 2 (two) times daily. (Patient taking differently: Take 20 mg by mouth in the morning.) 180 tablet 1   chlorthalidone  (HYGROTON ) 25 MG tablet Take 1 tablet (25 mg total) by mouth daily. 90 tablet 1   ciprofloxacin -dexamethasone  (CIPRODEX ) OTIC suspension Place 4 drops into the left ear as needed (ear infection).     ezetimibe  (ZETIA ) 10 MG tablet Take 1 tablet (10 mg total) by mouth daily. (Patient taking differently: Take 10 mg by mouth every evening.) 90 tablet 3   famotidine  (PEPCID ) 40 MG tablet Take 1 tablet (40 mg total) by mouth at bedtime. 90 tablet 3   Fezolinetant  (VEOZAH ) 45 MG TABS Take 1 tablet (45 mg total) by mouth daily. 30 tablet 0   gabapentin  (NEURONTIN ) 100 MG capsule Take 100 mg by mouth in the morning and at bedtime. At morning & noon     gabapentin  (NEURONTIN ) 400 MG capsule Take 1 capsule (400  mg total) by mouth at bedtime. 90 capsule 1   GLOBAL EASE INJECT PEN NEEDLES 32G X 4 MM MISC USE AS DIRECTED WITH VICTOZA for 30     glucose blood test strip Use as instructed 100 each 12   hydrocortisone  (CORTEF ) 5 MG tablet Take 2.5-5 mg by mouth See admin instructions. Take 1 tablet (5 mg) by mouth in the morning (scheduled) & may taken an additional 0.5 tablet (2.5 mg) by mouth in the evening if needed.     liraglutide (VICTOZA) 18 MG/3ML SOPN Inject 1.8 mg into the skin every evening. (1700)     metoprolol  tartrate (LOPRESSOR ) 50 MG tablet Take 1 tablet (50 mg total) by mouth 2 (two) times daily. 180 tablet 3   montelukast  (SINGULAIR ) 10 MG tablet Take 1 tablet (10 mg total) by mouth at bedtime. 90 tablet 1   NEURONTIN  100 MG capsule Take 2 capsules (200 mg total) by  mouth 2 (two) times daily. TAKE 1-2 CAPSULES EVERY 3-4 TIMES DAILY. 360 capsule 1   nitroGLYCERIN  (NITROSTAT ) 0.4 MG SL tablet Place 1 tablet (0.4 mg total) under the tongue every 5 (five) minutes as needed for chest pain. 25 tablet 1   nortriptyline  (PAMELOR ) 10 MG capsule TAKE TWO CAPSULES BY MOUTH AT BEDTIME 180 capsule 10   ondansetron  (ZOFRAN ) 4 MG tablet Take 1 tablet (4 mg total) by mouth every 8 (eight) hours as needed for nausea or vomiting. 20 tablet 0   pantoprazole  (PROTONIX ) 40 MG tablet Take 1 tablet (40 mg total) by mouth 2 (two) times daily. 180 tablet 3   potassium chloride  SA (KLOR-CON  M) 20 MEQ tablet Take 2 tablets (40 mEq total) by mouth daily. 180 tablet 3   psyllium (HYDROCIL/METAMUCIL) 95 % PACK Take 1 packet by mouth daily.     sertraline  (ZOLOFT ) 100 MG tablet Take 1 tablet (100 mg total) by mouth daily. 90 tablet 1   sodium chloride  (OCEAN) 0.65 % SOLN nasal spray PLACE 1 SPRAY INTO BOTH NOSTRILS AS NEEDED FOR CONGESTION. (Patient taking differently: Place 1 spray into both nostrils in the morning, at noon, in the evening, and at bedtime.) 3 Bottle 0   SUMAtriptan  (IMITREX ) 25 MG tablet Take 1 tablet (25 mg total) by mouth every 2 (two) hours as needed for migraine. May repeat in 2 hours if headache persists or recurs. 12 tablet 6   SYMBICORT  160-4.5 MCG/ACT inhaler Inhale 2 puffs into the lungs in the morning and at bedtime. 30.6 g 1   Tezepelumab -ekko (TEZSPIRE ) 210 MG/1. SOAJ Inject 210 mg into the skin every 28 (twenty-eight) days. 1.91 mL 11   triamcinolone  (NASACORT ) 55 MCG/ACT AERO nasal inhaler Place 1 spray into the nose 2 (two) times daily. 1 spray each nostril 2 times per day 50.7 g 1   VENTOLIN  HFA 108 (90 Base) MCG/ACT inhaler INHALE TWO PUFFS INTO THE LUNGS EVERY 4 (FOUR) HOURS AS NEEDED FOR WHEEZING OR SHORTNESS OF BREATH. 18 g 0   Current Facility-Administered Medications on File Prior to Visit  Medication Dose Route Frequency Provider Last Rate Last  Admin   tezepelumab -ekko (TEZSPIRE ) 210 MG/1. syringe 210 mg  210 mg Subcutaneous Q28 days Kozlow, Eric J, MD   210 mg at 10/30/23 1338    Family History  Problem Relation Age of Onset   Heart disease Mother    Diabetes Mother    Stroke Mother    Heart disease Father    Stroke Father    Diabetes Father  Hepatitis Sister        hcv   Asthma Sister    Allergic rhinitis Sister    Stroke Other    Colon polyps Brother    Renal Disease Brother    Cancer Sister        lung   Asthma Sister    Allergic rhinitis Sister    Cancer Maternal Aunt    Cancer Maternal Aunt    Colon cancer Neg Hx    Esophageal cancer Neg Hx    Stomach cancer Neg Hx    Rectal cancer Neg Hx    Angioedema Neg Hx    Eczema Neg Hx    Urticaria Neg Hx     Social History   Socioeconomic History   Marital status: Single    Spouse name: Not on file   Number of children: 0   Years of education: 12   Highest education level: 12th grade  Occupational History   Occupation: Disabled  Tobacco Use   Smoking status: Former    Current packs/day: 0.00    Types: Cigarettes    Start date: 04/26/1987    Quit date: 04/26/2007    Years since quitting: 16.5   Smokeless tobacco: Never   Tobacco comments:    QUIT 2009  Vaping Use   Vaping status: Never Used  Substance and Sexual Activity   Alcohol use: No    Alcohol/week: 0.0 standard drinks of alcohol   Drug use: Yes    Types: Marijuana   Sexual activity: Not Currently    Partners: Male    Birth control/protection: Condom    Comment: declined condoms  Other Topics Concern   Not on file  Social History Narrative   Lives at home alone.   Right-handed.   Occasional tea or soda.   Te   Social Drivers of Corporate investment banker Strain: Low Risk  (08/14/2023)   Overall Financial Resource Strain (CARDIA)    Difficulty of Paying Living Expenses: Not very hard  Food Insecurity: No Food Insecurity (10/25/2023)   Hunger Vital Sign    Worried About Running  Out of Food in the Last Year: Never true    Ran Out of Food in the Last Year: Never true  Transportation Needs: No Transportation Needs (10/25/2023)   PRAPARE - Administrator, Civil Service (Medical): No    Lack of Transportation (Non-Medical): No  Physical Activity: Unknown (08/14/2023)   Exercise Vital Sign    Days of Exercise per Week: 2 days    Minutes of Exercise per Session: Patient declined  Stress: Stress Concern Present (08/14/2023)   [employer] of Occupational Health - Occupational Stress Questionnaire    Feeling of Stress : Rather much  Social Connections: Moderately Integrated (08/14/2023)   Social Connection and Isolation Panel    Frequency of Communication with Friends and Family: More than three times a week    Frequency of Social Gatherings with Friends and Family: Never    Attends Religious Services: More than 4 times per year    Active Member of [employer] or Organizations: Yes    Attends Banker Meetings: More than 4 times per year    Marital Status: Never married  Intimate Partner Violence: Not At Risk (10/25/2023)   Humiliation, Afraid, Rape, and Kick questionnaire    Fear of Current or Ex-Partner: No    Emotionally Abused: No    Physically Abused: No    Sexually Abused: No    Review  of Systems: ROS negative except for what is noted on the assessment and plan.  Vitals:   11/08/23 0952  BP: 128/66  Pulse: 76  Temp: 98 F (36.7 C)  TempSrc: Oral  SpO2: 97%  Weight: 172 lb 12.8 oz (78.4 kg)  Height: 5' 1 (1.549 m)    Physical Exam: Constitutional: well-appearing female  in no acute distress Cardiovascular: regular rate and rhythm, no m/r/g Pulmonary/Chest: normal work of breathing on room air, lungs clear to auscultation bilaterally  Assessment & Plan:   HTN (hypertension) Vitals:   11/08/23 0952  BP: 128/66     Pt presents for follow up regarding blood pressure. At her last visit, her hydrochlorothiazide  was switched to  chlorthalidone , which she states she has been taking and hasn't had any trouble with. Her other anti-hypertensive regimen is amlodipine  10mg  and metoprolol  50mg  BID. BP well controlled this morning, will not make any changes.   Plan:  - Pt had BMP done earlier this morning as part of her pre-op clearance, will follow  Vitamin D  deficiency Refilled her vitamin D  3000U daily. She does have osteoporosis diagnosed. Last checked one month ago was 37, low normal.   Patient discussed with Dr. Karna Dirks [name] [name], M.D. [facility] Internal Medicine, PGY-3 Pager: [phone] Date 11/08/2023 Time 3:36 PM

## 2023-11-08 NOTE — Assessment & Plan Note (Signed)
 Vitals:   11/08/23 0952  BP: 128/66     Pt presents for follow up regarding blood pressure. At her last visit, her hydrochlorothiazide  was switched to chlorthalidone , which she states she has been taking and hasn't had any trouble with. Her other anti-hypertensive regimen is amlodipine  10mg  and metoprolol  50mg  BID. BP well controlled this morning, will not make any changes.   Plan:  - Pt had BMP done earlier this morning as part of her pre-op clearance, will follow

## 2023-11-09 NOTE — Anesthesia Preprocedure Evaluation (Addendum)
 Anesthesia Evaluation  Patient identified by MRN, date of birth, ID band Patient awake    Reviewed: Allergy  & Precautions, NPO status , Patient's Chart, lab work & pertinent test results, reviewed documented beta blocker date and time   Airway Mallampati: II  TM Distance: >3 FB Neck ROM: Full    Dental  (+) Dental Advisory Given, Partial Upper   Pulmonary asthma , former smoker   Pulmonary exam normal breath sounds clear to auscultation       Cardiovascular hypertension, Pt. on home beta blockers and Pt. on medications + CAD and +CHF  Normal cardiovascular exam Rhythm:Regular Rate:Normal     Neuro/Psych  Headaches PSYCHIATRIC DISORDERS Anxiety Depression    Pituitary microadenoma    GI/Hepatic ,GERD  Medicated,,(+)     substance abuse  marijuana use, Hepatitis -, C  Endo/Other  diabetes  Obesity Secondary adrenal insufficiency  Renal/GU negative Renal ROS     Musculoskeletal  (+) Arthritis ,  left shoulder osteoarthritis   Abdominal   Peds  Hematology  (+) HIV, REFUSES BLOOD PRODUCTS, JEHOVAH'S WITNESS  Anesthesia Other Findings Day of surgery medications reviewed with the patient.  Reproductive/Obstetrics                              Anesthesia Physical Anesthesia Plan  ASA: 3  Anesthesia Plan: General   Post-op Pain Management: Tylenol  PO (pre-op)* and Regional block*   Induction: Intravenous  PONV Risk Score and Plan: 3 and Midazolam , Dexamethasone  and Ondansetron   Airway Management Planned: Oral ETT  Additional Equipment:   Intra-op Plan:   Post-operative Plan: Extubation in OR  Informed Consent: I have reviewed the patients History and Physical, chart, labs and discussed the procedure including the risks, benefits and alternatives for the proposed anesthesia with the patient or authorized representative who has indicated his/her understanding and acceptance.      Dental advisory given  Plan Discussed with: CRNA  Anesthesia Plan Comments: (PAT note written 11/09/2023 by Allison Zelenak, PA-C. On daily Cortef  for adrenal insufficiency.   )         Anesthesia Quick Evaluation
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
## 2023-11-13 ENCOUNTER — Telehealth: Payer: Self-pay | Admitting: Orthopedic Surgery

## 2023-11-13 NOTE — Progress Notes (Signed)
 Internal Medicine Clinic Attending  Case discussed with the resident at the time of the visit.  We reviewed the resident's history and exam and pertinent patient test results.  I agree with the assessment, diagnosis, and plan of care documented in the resident's note.

## 2023-11-13 NOTE — Telephone Encounter (Signed)
 Patient called. Says her insurance is not covering the chair that she needs after surgery. She would like to know, Does Dr. Addie still want her to have the chair?

## 2023-11-13 NOTE — Telephone Encounter (Signed)
 Okay to not use chair but she will have to make sure she does home exercises to get her shoulder range of motion back especially the first 2 weeks

## 2023-11-14 ENCOUNTER — Telehealth: Payer: Self-pay | Admitting: Orthopedic Surgery

## 2023-11-14 NOTE — Telephone Encounter (Signed)
 Hi [name] [name] she is okay to go to physical therapy 1 visit before surgery just so she can learn some exercises to do while she is at home.  Will send her to therapy after surgery as well but it might be better for her to do a lot of these exercises on her own.  But we will send her to therapy 1 time before surgery and multiple times after surgery.  Thanks

## 2023-11-14 NOTE — Telephone Encounter (Signed)
 Patient called and ask if she can do PT after surgery. RA#[phone]

## 2023-11-15 ENCOUNTER — Other Ambulatory Visit: Payer: Self-pay

## 2023-11-15 DIAGNOSIS — M19012 Primary osteoarthritis, left shoulder: Secondary | ICD-10-CM

## 2023-11-15 NOTE — Telephone Encounter (Signed)
 Mychart message sent to patient. Dr Addie will order PT post op

## 2023-11-15 NOTE — Telephone Encounter (Signed)
 Okay for outpatient PT to start 1 week postop to focus on passive and active assisted range of motion of the operative shoulder with no external rotation passively past 30 degrees.  Okay for active range of motion of the shoulder starting on 11/30/2023.  No lifting with the operative arm until 3 weeks postop on 12/07/2023.

## 2023-11-16 ENCOUNTER — Encounter (HOSPITAL_COMMUNITY)
Admission: RE | Disposition: A | Discharge status: Home / Self Care | Payer: Self-pay | Source: Home / Self Care | Attending: Orthopedic Surgery

## 2023-11-16 ENCOUNTER — Ambulatory Visit (HOSPITAL_COMMUNITY): Payer: Self-pay | Admitting: Vascular Surgery

## 2023-11-16 ENCOUNTER — Observation Stay (HOSPITAL_COMMUNITY)

## 2023-11-16 ENCOUNTER — Other Ambulatory Visit: Payer: Self-pay

## 2023-11-16 ENCOUNTER — Ambulatory Visit (HOSPITAL_COMMUNITY): Payer: Self-pay | Admitting: Anesthesiology

## 2023-11-16 ENCOUNTER — Observation Stay (HOSPITAL_COMMUNITY)
Admission: RE | Admit: 2023-11-16 | Discharge: 2023-11-17 | Disposition: A | Discharge status: Home / Self Care | Attending: Orthopedic Surgery | Admitting: Orthopedic Surgery

## 2023-11-16 ENCOUNTER — Encounter (HOSPITAL_COMMUNITY): Payer: Self-pay | Admitting: Orthopedic Surgery

## 2023-11-16 DIAGNOSIS — J45909 Unspecified asthma, uncomplicated: Secondary | ICD-10-CM | POA: Insufficient documentation

## 2023-11-16 DIAGNOSIS — F12920 Cannabis use, unspecified with intoxication, uncomplicated: Secondary | ICD-10-CM | POA: Insufficient documentation

## 2023-11-16 DIAGNOSIS — M75122 Complete rotator cuff tear or rupture of left shoulder, not specified as traumatic: Secondary | ICD-10-CM | POA: Diagnosis not present

## 2023-11-16 DIAGNOSIS — M19012 Primary osteoarthritis, left shoulder: Principal | ICD-10-CM | POA: Insufficient documentation

## 2023-11-16 DIAGNOSIS — Z96612 Presence of left artificial shoulder joint: Secondary | ICD-10-CM | POA: Insufficient documentation

## 2023-11-16 DIAGNOSIS — I2510 Atherosclerotic heart disease of native coronary artery without angina pectoris: Secondary | ICD-10-CM | POA: Insufficient documentation

## 2023-11-16 DIAGNOSIS — Z7982 Long term (current) use of aspirin: Secondary | ICD-10-CM | POA: Insufficient documentation

## 2023-11-16 DIAGNOSIS — I110 Hypertensive heart disease with heart failure: Secondary | ICD-10-CM | POA: Diagnosis not present

## 2023-11-16 DIAGNOSIS — Z01818 Encounter for other preprocedural examination: Principal | ICD-10-CM

## 2023-11-16 DIAGNOSIS — I509 Heart failure, unspecified: Secondary | ICD-10-CM | POA: Insufficient documentation

## 2023-11-16 DIAGNOSIS — E119 Type 2 diabetes mellitus without complications: Secondary | ICD-10-CM | POA: Diagnosis not present

## 2023-11-16 DIAGNOSIS — Z87891 Personal history of nicotine dependence: Secondary | ICD-10-CM | POA: Diagnosis not present

## 2023-11-16 DIAGNOSIS — G8918 Other acute postprocedural pain: Secondary | ICD-10-CM | POA: Diagnosis not present

## 2023-11-16 HISTORY — PX: REVERSE SHOULDER ARTHROPLASTY: SHX5054

## 2023-11-16 LAB — GLUCOSE, CAPILLARY
Glucose-Capillary: 169 mg/dL — ABNORMAL HIGH (ref 70–99)
Glucose-Capillary: 150 mg/dL — ABNORMAL HIGH (ref 70–99)
Glucose-Capillary: 246 mg/dL — ABNORMAL HIGH (ref 70–99)
Glucose-Capillary: 206 mg/dL — ABNORMAL HIGH (ref 70–99)
Glucose-Capillary: 216 mg/dL — ABNORMAL HIGH (ref 70–99)

## 2023-11-16 SURGERY — ARTHROPLASTY, SHOULDER, TOTAL, REVERSE
Anesthesia: General | Site: Shoulder | Laterality: Left

## 2023-11-16 MED ORDER — HYDROCORTISONE 5 MG PO TABS
5.0000 mg | ORAL_TABLET | Freq: Every day | ORAL | Status: DC
  Administered 2023-11-16: 5 mg via ORAL
  Filled 2023-11-16 (×2): qty 1

## 2023-11-16 MED ORDER — GABAPENTIN 400 MG PO CAPS
400.0000 mg | ORAL_CAPSULE | Freq: Every day | ORAL | Status: DC
  Administered 2023-11-16: 400 mg via ORAL
  Filled 2023-11-16: qty 1

## 2023-11-16 MED ORDER — DEXAMETHASONE SODIUM PHOSPHATE 10 MG/ML IJ SOLN
INTRAMUSCULAR | Status: AC
Start: 2023-11-16 — End: 2023-11-16
  Filled 2023-11-16: qty 1

## 2023-11-16 MED ORDER — PROPOFOL 10 MG/ML IV BOLUS
INTRAVENOUS | Status: AC
  Filled 2023-11-16: qty 20

## 2023-11-16 MED ORDER — FENTANYL CITRATE (PF) 250 MCG/5ML IJ SOLN
INTRAMUSCULAR | Status: AC
  Filled 2023-11-16: qty 5

## 2023-11-16 MED ORDER — LIDOCAINE 2% (20 MG/ML) 5 ML SYRINGE
INTRAMUSCULAR | Status: DC | PRN
  Administered 2023-11-16: 80 mg via INTRAVENOUS

## 2023-11-16 MED ORDER — MIDAZOLAM HCL 2 MG/2ML IJ SOLN
2.0000 mg | Freq: Once | INTRAMUSCULAR | Status: AC
Start: 2023-11-16 — End: 2023-11-16

## 2023-11-16 MED ORDER — ONDANSETRON HCL 4 MG/2ML IJ SOLN
INTRAMUSCULAR | Status: AC
  Filled 2023-11-16: qty 2

## 2023-11-16 MED ORDER — LIDOCAINE 2% (20 MG/ML) 5 ML SYRINGE
INTRAMUSCULAR | Status: AC
  Filled 2023-11-16: qty 5

## 2023-11-16 MED ORDER — BUPIVACAINE LIPOSOME 1.3 % IJ SUSP
INTRAMUSCULAR | Status: DC | PRN
  Administered 2023-11-16: 10 mL via PERINEURAL

## 2023-11-16 MED ORDER — PHENYLEPHRINE HCL-NACL 20-0.9 MG/250ML-% IV SOLN
INTRAVENOUS | Status: DC | PRN
  Administered 2023-11-16: 15 ug/min via INTRAVENOUS

## 2023-11-16 MED ORDER — PHENYLEPHRINE 80 MCG/ML (10ML) SYRINGE FOR IV PUSH (FOR BLOOD PRESSURE SUPPORT)
PREFILLED_SYRINGE | INTRAVENOUS | Status: DC | PRN
  Administered 2023-11-16: 160 ug via INTRAVENOUS
  Administered 2023-11-16 (×3): 80 ug via INTRAVENOUS

## 2023-11-16 MED ORDER — ONDANSETRON HCL 4 MG PO TABS: 4.0000 mg | ORAL_TABLET | Freq: Four times a day (QID) | ORAL | Status: DC | PRN

## 2023-11-16 MED ORDER — VANCOMYCIN HCL 1000 MG IV SOLR
INTRAVENOUS | Status: DC | PRN
  Administered 2023-11-16: 1000 mg

## 2023-11-16 MED ORDER — LACTATED RINGERS IV SOLN: INTRAVENOUS | Status: DC

## 2023-11-16 MED ORDER — VANCOMYCIN HCL 1000 MG IV SOLR
INTRAVENOUS | Status: AC
  Filled 2023-11-16: qty 20

## 2023-11-16 MED ORDER — INSULIN ASPART 100 UNIT/ML IJ SOLN
INTRAMUSCULAR | Status: AC
  Filled 2023-11-16: qty 1

## 2023-11-16 MED ORDER — DOCUSATE SODIUM 100 MG PO CAPS
100.0000 mg | ORAL_CAPSULE | Freq: Two times a day (BID) | ORAL | Status: DC
  Administered 2023-11-16 – 2023-11-17 (×2): 100 mg via ORAL
  Filled 2023-11-16 (×2): qty 1

## 2023-11-16 MED ORDER — POVIDONE-IODINE 7.5 % EX SOLN
Freq: Once | CUTANEOUS | Status: AC
  Filled 2023-11-16: qty 118

## 2023-11-16 MED ORDER — FLUTICASONE FUROATE-VILANTEROL 200-25 MCG/ACT IN AEPB
1.0000 | INHALATION_SPRAY | Freq: Every day | RESPIRATORY_TRACT | Status: DC
  Filled 2023-11-16: qty 28

## 2023-11-16 MED ORDER — VASOPRESSIN 20 UNIT/ML IV SOLN
INTRAVENOUS | Status: AC
  Filled 2023-11-16: qty 1

## 2023-11-16 MED ORDER — 0.9 % SODIUM CHLORIDE (POUR BTL) OPTIME
TOPICAL | Status: DC | PRN
  Administered 2023-11-16: 4000 mL

## 2023-11-16 MED ORDER — MENTHOL 3 MG MT LOZG: 1.0000 | LOZENGE | OROMUCOSAL | Status: DC | PRN

## 2023-11-16 MED ORDER — PHENOL 1.4 % MT LIQD
1.0000 | OROMUCOSAL | Status: DC | PRN
  Administered 2023-11-16: 1 via OROMUCOSAL
  Filled 2023-11-16: qty 177

## 2023-11-16 MED ORDER — GABAPENTIN 100 MG PO CAPS
100.0000 mg | ORAL_CAPSULE | Freq: Three times a day (TID) | ORAL | Status: DC
  Administered 2023-11-16: 100 mg via ORAL
  Filled 2023-11-16: qty 1

## 2023-11-16 MED ORDER — SUGAMMADEX SODIUM 200 MG/2ML IV SOLN
INTRAVENOUS | Status: AC
  Filled 2023-11-16: qty 2

## 2023-11-16 MED ORDER — FENTANYL CITRATE (PF) 250 MCG/5ML IJ SOLN
INTRAMUSCULAR | Status: DC | PRN
  Administered 2023-11-16: 100 ug via INTRAVENOUS

## 2023-11-16 MED ORDER — FAMOTIDINE 20 MG PO TABS
40.0000 mg | ORAL_TABLET | Freq: Every day | ORAL | Status: DC
  Administered 2023-11-16: 40 mg via ORAL
  Filled 2023-11-16: qty 2

## 2023-11-16 MED ORDER — GLYCOPYRROLATE PF 0.2 MG/ML IJ SOSY
PREFILLED_SYRINGE | INTRAMUSCULAR | Status: DC | PRN
  Administered 2023-11-16: .2 mg via INTRAVENOUS

## 2023-11-16 MED ORDER — PANTOPRAZOLE SODIUM 40 MG PO TBEC
40.0000 mg | DELAYED_RELEASE_TABLET | Freq: Two times a day (BID) | ORAL | Status: DC
  Administered 2023-11-16 – 2023-11-17 (×3): 40 mg via ORAL
  Filled 2023-11-16 (×3): qty 1

## 2023-11-16 MED ORDER — SUMATRIPTAN SUCCINATE 25 MG PO TABS: 25.0000 mg | ORAL_TABLET | ORAL | Status: DC | PRN

## 2023-11-16 MED ORDER — VASOPRESSIN 20 UNIT/ML IV SOLN
INTRAVENOUS | Status: DC | PRN
  Administered 2023-11-16 (×5): 1 [IU] via INTRAVENOUS

## 2023-11-16 MED ORDER — HYDROCORTISONE 10 MG PO TABS
10.0000 mg | ORAL_TABLET | Freq: Every day | ORAL | Status: DC
  Administered 2023-11-17: 10 mg via ORAL
  Filled 2023-11-16: qty 1

## 2023-11-16 MED ORDER — SERTRALINE HCL 100 MG PO TABS
100.0000 mg | ORAL_TABLET | Freq: Every day | ORAL | Status: DC
  Administered 2023-11-17: 100 mg via ORAL
  Filled 2023-11-16: qty 1

## 2023-11-16 MED ORDER — INSULIN ASPART 100 UNIT/ML IJ SOLN
0.0000 [IU] | Freq: Three times a day (TID) | INTRAMUSCULAR | Status: DC
  Administered 2023-11-17: 3 [IU] via SUBCUTANEOUS

## 2023-11-16 MED ORDER — ONDANSETRON HCL 4 MG/2ML IJ SOLN: 4.0000 mg | Freq: Once | INTRAMUSCULAR | Status: DC | PRN

## 2023-11-16 MED ORDER — FENTANYL CITRATE (PF) 100 MCG/2ML IJ SOLN
INTRAMUSCULAR | Status: AC
Start: 2023-11-16 — End: 2023-11-16
  Filled 2023-11-16: qty 2

## 2023-11-16 MED ORDER — METHOCARBAMOL 500 MG PO TABS
500.0000 mg | ORAL_TABLET | Freq: Four times a day (QID) | ORAL | Status: DC | PRN
  Administered 2023-11-16 – 2023-11-17 (×3): 500 mg via ORAL
  Filled 2023-11-16 (×3): qty 1

## 2023-11-16 MED ORDER — POVIDONE-IODINE 10 % EX SWAB
2.0000 | Freq: Once | CUTANEOUS | Status: AC
  Administered 2023-11-16: 2 via TOPICAL

## 2023-11-16 MED ORDER — OXYCODONE HCL 5 MG PO TABS
5.0000 mg | ORAL_TABLET | ORAL | Status: DC | PRN
  Administered 2023-11-16 – 2023-11-17 (×5): 10 mg via ORAL
  Filled 2023-11-16 (×5): qty 2

## 2023-11-16 MED ORDER — METOPROLOL TARTRATE 50 MG PO TABS
50.0000 mg | ORAL_TABLET | Freq: Two times a day (BID) | ORAL | Status: DC
  Administered 2023-11-16 – 2023-11-17 (×2): 50 mg via ORAL
  Filled 2023-11-16 (×2): qty 1

## 2023-11-16 MED ORDER — TRIAMCINOLONE ACETONIDE 55 MCG/ACT NA AERO
1.0000 | INHALATION_SPRAY | Freq: Two times a day (BID) | NASAL | Status: DC
  Administered 2023-11-16: 1 via NASAL
  Filled 2023-11-16: qty 10.8

## 2023-11-16 MED ORDER — INSULIN ASPART 100 UNIT/ML IJ SOLN
0.0000 [IU] | INTRAMUSCULAR | Status: DC | PRN
  Administered 2023-11-16: 2 [IU] via SUBCUTANEOUS

## 2023-11-16 MED ORDER — FENTANYL CITRATE (PF) 100 MCG/2ML IJ SOLN: 50.0000 ug | Freq: Once | INTRAMUSCULAR | Status: AC

## 2023-11-16 MED ORDER — MIDAZOLAM HCL 2 MG/2ML IJ SOLN
INTRAMUSCULAR | Status: AC
  Administered 2023-11-16: 2 mg via INTRAVENOUS
  Filled 2023-11-16: qty 2

## 2023-11-16 MED ORDER — SUGAMMADEX SODIUM 200 MG/2ML IV SOLN
INTRAVENOUS | Status: DC | PRN
Start: 2023-11-16 — End: 2023-11-17
  Administered 2023-11-16: 150 mg via INTRAVENOUS

## 2023-11-16 MED ORDER — CHLORTHALIDONE 25 MG PO TABS
25.0000 mg | ORAL_TABLET | Freq: Every day | ORAL | Status: DC
  Administered 2023-11-16 – 2023-11-17 (×2): 25 mg via ORAL
  Filled 2023-11-16 (×2): qty 1

## 2023-11-16 MED ORDER — INSULIN ASPART 100 UNIT/ML IJ SOLN
6.0000 [IU] | Freq: Once | INTRAMUSCULAR | Status: AC
  Administered 2023-11-16: 6 [IU] via SUBCUTANEOUS

## 2023-11-16 MED ORDER — AMLODIPINE BESYLATE 10 MG PO TABS
10.0000 mg | ORAL_TABLET | Freq: Every day | ORAL | Status: DC
  Filled 2023-11-16: qty 1

## 2023-11-16 MED ORDER — CEFAZOLIN SODIUM-DEXTROSE 2-4 GM/100ML-% IV SOLN
2.0000 g | INTRAVENOUS | Status: AC
  Administered 2023-11-16: 2 g via INTRAVENOUS
  Filled 2023-11-16: qty 100

## 2023-11-16 MED ORDER — EPHEDRINE SULFATE-NACL 50-0.9 MG/10ML-% IV SOSY
PREFILLED_SYRINGE | INTRAVENOUS | Status: DC | PRN
  Administered 2023-11-16 (×2): 5 mg via INTRAVENOUS
  Administered 2023-11-16: 10 mg via INTRAVENOUS
  Administered 2023-11-16: 5 mg via INTRAVENOUS

## 2023-11-16 MED ORDER — ALBUMIN HUMAN 5 % IV SOLN: INTRAVENOUS | Status: DC | PRN

## 2023-11-16 MED ORDER — LORATADINE 10 MG PO TABS
10.0000 mg | ORAL_TABLET | Freq: Every day | ORAL | Status: DC
  Administered 2023-11-16 – 2023-11-17 (×2): 10 mg via ORAL
  Filled 2023-11-16 (×2): qty 1

## 2023-11-16 MED ORDER — PHENYLEPHRINE 80 MCG/ML (10ML) SYRINGE FOR IV PUSH (FOR BLOOD PRESSURE SUPPORT)
PREFILLED_SYRINGE | INTRAVENOUS | Status: AC
  Filled 2023-11-16: qty 10

## 2023-11-16 MED ORDER — CHLORHEXIDINE GLUCONATE 0.12 % MT SOLN
15.0000 mL | Freq: Once | OROMUCOSAL | Status: AC
  Administered 2023-11-16: 15 mL via OROMUCOSAL
  Filled 2023-11-16: qty 15

## 2023-11-16 MED ORDER — IRRISEPT - 450ML BOTTLE WITH 0.05% CHG IN STERILE WATER, USP 99.95% OPTIME
TOPICAL | Status: DC | PRN
Start: 2023-11-16 — End: 2023-11-16
  Administered 2023-11-16: 450 mL

## 2023-11-16 MED ORDER — ORAL CARE MOUTH RINSE: 15.0000 mL | Freq: Once | OROMUCOSAL | Status: AC

## 2023-11-16 MED ORDER — GLYCOPYRROLATE PF 0.2 MG/ML IJ SOSY
PREFILLED_SYRINGE | INTRAMUSCULAR | Status: AC
  Filled 2023-11-16: qty 1

## 2023-11-16 MED ORDER — HYDROMORPHONE HCL 1 MG/ML IJ SOLN: 0.5000 mg | INTRAMUSCULAR | Status: DC | PRN

## 2023-11-16 MED ORDER — INSULIN ASPART 100 UNIT/ML IJ SOLN
0.0000 [IU] | Freq: Every day | INTRAMUSCULAR | Status: DC
  Administered 2023-11-16: 2 [IU] via SUBCUTANEOUS

## 2023-11-16 MED ORDER — ROCURONIUM BROMIDE 10 MG/ML (PF) SYRINGE
PREFILLED_SYRINGE | INTRAVENOUS | Status: AC
  Filled 2023-11-16: qty 10

## 2023-11-16 MED ORDER — METOCLOPRAMIDE HCL 5 MG PO TABS: 5.0000 mg | ORAL_TABLET | Freq: Three times a day (TID) | ORAL | Status: DC | PRN

## 2023-11-16 MED ORDER — BUPIVACAINE LIPOSOME 1.3 % IJ SUSP
INTRAMUSCULAR | Status: DC | PRN
Start: 2023-11-16 — End: 2023-11-16

## 2023-11-16 MED ORDER — CEFAZOLIN SODIUM-DEXTROSE 2-4 GM/100ML-% IV SOLN
2.0000 g | Freq: Three times a day (TID) | INTRAVENOUS | Status: AC
  Administered 2023-11-16 – 2023-11-17 (×3): 2 g via INTRAVENOUS
  Filled 2023-11-16 (×4): qty 100

## 2023-11-16 MED ORDER — PROPOFOL 10 MG/ML IV BOLUS
INTRAVENOUS | Status: DC | PRN
  Administered 2023-11-16: 130 mg via INTRAVENOUS

## 2023-11-16 MED ORDER — EZETIMIBE 10 MG PO TABS
10.0000 mg | ORAL_TABLET | Freq: Every evening | ORAL | Status: DC
  Administered 2023-11-16: 10 mg via ORAL
  Filled 2023-11-16: qty 1

## 2023-11-16 MED ORDER — FEZOLINETANT 45 MG PO TABS: 1.0000 | ORAL_TABLET | Freq: Every day | ORAL | Status: DC

## 2023-11-16 MED ORDER — AMISULPRIDE (ANTIEMETIC) 5 MG/2ML IV SOLN: 10.0000 mg | Freq: Once | INTRAVENOUS | Status: DC | PRN

## 2023-11-16 MED ORDER — EPHEDRINE 5 MG/ML INJ
INTRAVENOUS | Status: AC
  Filled 2023-11-16: qty 5

## 2023-11-16 MED ORDER — VITAMIN D 25 MCG (1000 UNIT) PO TABS
3000.0000 [IU] | ORAL_TABLET | Freq: Every day | ORAL | Status: DC
  Administered 2023-11-16 – 2023-11-17 (×2): 3000 [IU] via ORAL
  Filled 2023-11-16 (×2): qty 3

## 2023-11-16 MED ORDER — MONTELUKAST SODIUM 10 MG PO TABS
10.0000 mg | ORAL_TABLET | Freq: Every day | ORAL | Status: DC
  Administered 2023-11-16: 10 mg via ORAL
  Filled 2023-11-16: qty 1

## 2023-11-16 MED ORDER — ASPIRIN 81 MG PO TBEC
81.0000 mg | DELAYED_RELEASE_TABLET | Freq: Every day | ORAL | Status: DC
  Administered 2023-11-17: 81 mg via ORAL
  Filled 2023-11-16: qty 1

## 2023-11-16 MED ORDER — POTASSIUM CHLORIDE CRYS ER 20 MEQ PO TBCR
40.0000 meq | EXTENDED_RELEASE_TABLET | Freq: Every day | ORAL | Status: DC
Start: 2023-11-16 — End: 2023-11-17
  Administered 2023-11-16 – 2023-11-17 (×2): 40 meq via ORAL
  Filled 2023-11-16 (×2): qty 2

## 2023-11-16 MED ORDER — FENTANYL CITRATE (PF) 100 MCG/2ML IJ SOLN
INTRAMUSCULAR | Status: AC
  Administered 2023-11-16: 50 ug via INTRAVENOUS
  Filled 2023-11-16: qty 2

## 2023-11-16 MED ORDER — BUPIVACAINE HCL (PF) 0.5 % IJ SOLN
INTRAMUSCULAR | Status: DC | PRN
Start: 2023-11-16 — End: 2023-11-17
  Administered 2023-11-16: 10 mL via PERINEURAL

## 2023-11-16 MED ORDER — TRANEXAMIC ACID-NACL 1000-0.7 MG/100ML-% IV SOLN
1000.0000 mg | INTRAVENOUS | Status: AC
  Administered 2023-11-16: 1000 mg via INTRAVENOUS
  Filled 2023-11-16: qty 100

## 2023-11-16 MED ORDER — ONDANSETRON HCL 4 MG/2ML IJ SOLN
INTRAMUSCULAR | Status: DC | PRN
Start: 2023-11-16 — End: 2023-11-17
  Administered 2023-11-16: 4 mg via INTRAVENOUS

## 2023-11-16 MED ORDER — FENTANYL CITRATE (PF) 100 MCG/2ML IJ SOLN
25.0000 ug | INTRAMUSCULAR | Status: DC | PRN
  Administered 2023-11-16: 50 ug via INTRAVENOUS

## 2023-11-16 MED ORDER — METOCLOPRAMIDE HCL 5 MG/ML IJ SOLN: 5.0000 mg | Freq: Three times a day (TID) | INTRAMUSCULAR | Status: DC | PRN

## 2023-11-16 MED ORDER — GABAPENTIN 100 MG PO CAPS
100.0000 mg | ORAL_CAPSULE | Freq: Two times a day (BID) | ORAL | Status: DC
  Administered 2023-11-17: 100 mg via ORAL
  Filled 2023-11-16: qty 1

## 2023-11-16 MED ORDER — DEXAMETHASONE SODIUM PHOSPHATE 10 MG/ML IJ SOLN
INTRAMUSCULAR | Status: DC | PRN
  Administered 2023-11-16: 5 mg via INTRAVENOUS

## 2023-11-16 MED ORDER — NORTRIPTYLINE HCL 10 MG PO CAPS
20.0000 mg | ORAL_CAPSULE | Freq: Every day | ORAL | Status: DC
  Administered 2023-11-16: 20 mg via ORAL
  Filled 2023-11-16: qty 2

## 2023-11-16 MED ORDER — ROCURONIUM BROMIDE 10 MG/ML (PF) SYRINGE
PREFILLED_SYRINGE | INTRAVENOUS | Status: DC | PRN
  Administered 2023-11-16: 50 mg via INTRAVENOUS

## 2023-11-16 SURGICAL SUPPLY — 61 items
ALCOHOL 70% 16 OZ (MISCELLANEOUS) ×1 IMPLANT
BAG COUNTER SPONGE SURGICOUNT (BAG) ×1 IMPLANT
BASEPLATE AUG MED W-TAPER (Plate) IMPLANT
BEARING HUMERAL SHLDER 36M STD (Shoulder) IMPLANT
BIT DRILL 2.7 W/STOP DISP (BIT) IMPLANT
BIT DRILL TWIST 2.7 (BIT) IMPLANT
BLADE SAW SGTL 13X75X1.27 (BLADE) ×1 IMPLANT
CHLORAPREP W/TINT 26 (MISCELLANEOUS) ×1 IMPLANT
COOLER ICEMAN CLASSIC (MISCELLANEOUS) ×1 IMPLANT
COVER SURGICAL LIGHT HANDLE (MISCELLANEOUS) ×1 IMPLANT
DRAPE INCISE IOBAN 66X45 STRL (DRAPES) ×1 IMPLANT
DRAPE SURG ORHT 6 SPLT 77X108 (DRAPES) ×2 IMPLANT
DRAPE U-SHAPE 47X51 STRL (DRAPES) ×2 IMPLANT
DRSG AQUACEL AG ADV 3.5X10 (GAUZE/BANDAGES/DRESSINGS) ×1 IMPLANT
ELECTRODE BLDE 4.0 EZ CLN MEGD (MISCELLANEOUS) ×1 IMPLANT
ELECTRODE REM PT RTRN 9FT ADLT (ELECTROSURGICAL) ×1 IMPLANT
GAUZE SPONGE 4X4 12PLY STRL LF (GAUZE/BANDAGES/DRESSINGS) ×1 IMPLANT
GLENOID SPHERE STD STRL 36MM (Orthopedic Implant) IMPLANT
GLOVE BIOGEL PI IND STRL 6.5 (GLOVE) ×1 IMPLANT
GLOVE BIOGEL PI IND STRL 8 (GLOVE) ×1 IMPLANT
GLOVE ECLIPSE 6.5 STRL STRAW (GLOVE) ×1 IMPLANT
GLOVE ECLIPSE 8.0 STRL XLNG CF (GLOVE) ×1 IMPLANT
GOWN STRL REUS W/ TWL LRG LVL3 (GOWN DISPOSABLE) ×1 IMPLANT
GOWN STRL REUS W/ TWL XL LVL3 (GOWN DISPOSABLE) ×1 IMPLANT
GUIDE RSA SHLD BM ROT L SU (ORTHOPEDIC DISPOSABLE SUPPLIES) IMPLANT
HYDROGEN PEROXIDE 16OZ (MISCELLANEOUS) ×1 IMPLANT
KIT BASIN OR (CUSTOM PROCEDURE TRAY) ×1 IMPLANT
KIT TURNOVER KIT B (KITS) ×1 IMPLANT
LAVAGE JET IRRISEPT WOUND (IRRIGATION / IRRIGATOR) ×1 IMPLANT
MANIFOLD NEPTUNE II (INSTRUMENTS) ×1 IMPLANT
NDL SUT 6 .5 CRC .975X.05 MAYO (NEEDLE) IMPLANT
NS IRRIG 1000ML POUR BTL (IV SOLUTION) ×1 IMPLANT
PACK SHOULDER (CUSTOM PROCEDURE TRAY) ×1 IMPLANT
PAD COLD SHLDR WRAP-ON (PAD) ×1 IMPLANT
PIN STEINMANN THREADED TIP (PIN) IMPLANT
PIN THREADED REVERSE (PIN) IMPLANT
REAMER GUIDE BUSHING SURG DISP (MISCELLANEOUS) IMPLANT
REAMER GUIDE MINI 20 ZI (ORTHOPEDIC DISPOSABLE SUPPLIES) IMPLANT
REAMER GUIDE W/SCREW AUG (MISCELLANEOUS) IMPLANT
RESTRAINT HEAD UNIVERSAL NS (MISCELLANEOUS) ×1 IMPLANT
RETRIEVER SUT HEWSON (MISCELLANEOUS) ×1 IMPLANT
SCREW CENTRAL 6.5X20MM (Screw) IMPLANT
SCREW LOCKING 4.75MMX15MM (Screw) IMPLANT
SCREW LOCKING NS 4.75MMX20MM (Screw) IMPLANT
SLING ARM IMMOBILIZER LRG (SOFTGOODS) ×1 IMPLANT
SOL PREP POV-IOD 4OZ 10% (MISCELLANEOUS) ×1 IMPLANT
SPONGE T-LAP 18X18 ~~LOC~~+RFID (SPONGE) ×1 IMPLANT
STEM HUMERAL STRL 9MMX83MM (Stem) IMPLANT
STRIP CLOSURE SKIN 1/2X4 (GAUZE/BANDAGES/DRESSINGS) ×1 IMPLANT
SUCTION TUBE FRAZIER 10FR DISP (SUCTIONS) ×1 IMPLANT
SUT BROADBAND TAPE 2PK 1.5 (SUTURE) IMPLANT
SUT ETHILON 3 0 PS 1 (SUTURE) IMPLANT
SUT MNCRL AB 3-0 PS2 18 (SUTURE) ×1 IMPLANT
SUT SILK 2 0 TIES 10X30 (SUTURE) ×1 IMPLANT
SUT VIC AB 0 CT1 27XBRD ANBCTR (SUTURE) ×4 IMPLANT
SUT VIC AB 1 CT1 27XBRD ANBCTR (SUTURE) ×2 IMPLANT
SUT VIC AB 2-0 CT2 27 (SUTURE) ×3 IMPLANT
SUT VICRYL 0 UR6 27IN ABS (SUTURE) ×2 IMPLANT
TOWEL GREEN STERILE (TOWEL DISPOSABLE) ×1 IMPLANT
TRAY HUM REV SHOULDER STD +6 (Shoulder) IMPLANT
WATER STERILE IRR 1000ML POUR (IV SOLUTION) ×1 IMPLANT

## 2023-11-16 NOTE — Anesthesia Procedure Notes (Addendum)
 Anesthesia Regional Block: Interscalene brachial plexus block   Pre-Anesthetic Checklist: , timeout performed,  Correct Patient, Correct Site, Correct Laterality,  Correct Procedure, Correct Position, site marked,  Risks and benefits discussed,  Surgical consent,  Pre-op evaluation,  At surgeon's request and post-op pain management  Laterality: Left  Prep: chloraprep       Needles:  Injection technique: Single-shot  Needle Type: Echogenic Needle     Needle Length: 9cm  Needle Gauge: 21     Additional Needles:   Procedures:,,,, ultrasound used (permanent image in chart),,    Narrative:  Start time: 11/16/2023 8:24 AM End time: 11/16/2023 8:31 AM Injection made incrementally with aspirations every 5 mL.  Performed by: Personally  Anesthesiologist: Corinne Garnette BRAVO, MD  Additional Notes: No pain on injection. No increased resistance to injection. Injection made in 5cc increments.  Good needle visualization.  Patient tolerated procedure well.
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
## 2023-11-16 NOTE — Anesthesia Procedure Notes (Signed)
 Procedure Name: Intubation Date/Time: 11/16/2023 10:05 AM  Performed by: Jolynn Mage, CRNAPre-anesthesia Checklist: Patient identified, Patient being monitored, Timeout performed, Emergency Drugs available and Suction available Patient Re-evaluated:Patient Re-evaluated prior to induction Oxygen Delivery Method: Circle System Utilized Preoxygenation: Pre-oxygenation with 100% oxygen Induction Type: IV induction Ventilation: Mask ventilation without difficulty and Oral airway inserted - appropriate to patient size Laryngoscope Size: Cleotilde and 2 Grade View: Grade I Tube type: Oral Tube size: 7.0 mm Number of attempts: 1 Airway Equipment and Method: Stylet Placement Confirmation: ETT inserted through vocal cords under direct vision, positive ETCO2 and breath sounds checked- equal and bilateral Secured at: 21 cm Tube secured with: Tape Dental Injury: Teeth and Oropharynx as per pre-operative assessment

## 2023-11-16 NOTE — Brief Op Note (Signed)
   11/16/2023  1:36 PM  PATIENT:  [name] [name]  63 y.o. female  PRE-OPERATIVE DIAGNOSIS:  left shoulder osteoarthritis  POST-OPERATIVE DIAGNOSIS:  left shoulder osteoarthritis  PROCEDURE:  Procedure(s): ARTHROPLASTY, SHOULDER, TOTAL, REVERSE  SURGEON:  Surgeon(s): Addie, Cordella Hamilton, MD  ASSISTANT: magnant pa  ANESTHESIA:   general  EBL: 75 ml    Total I/O In: 1100 [I.V.:900; IV Piggyback:200] Out: 75 [Blood:75]  BLOOD ADMINISTERED: none  DRAINS: none   LOCAL MEDICATIONS USED:  vanco   SPECIMEN:  No Specimen  COUNTS:  YES  TOURNIQUET:  * No tourniquets in log *  DICTATION: .Other Dictation: Dictation Number [id number]  PLAN OF CARE: Admit for overnight observation  PATIENT DISPOSITION:  PACU - hemodynamically stable

## 2023-11-16 NOTE — Op Note (Signed)
 NAMEARIELY, RIDDELL MEDICAL RECORD NO: [id number] ACCOUNT NO: [id number] DATE OF BIRTH: 1960-08-22 FACILITY: [facility] LOCATION: [facility] PHYSICIAN: Cordella RAMAN. Addie, MD  Operative Report   DATE OF PROCEDURE: 11/16/2023  PREOPERATIVE DIAGNOSES:  Left shoulder arthritis and rotator cuff arthropathy.  POSTOPERATIVE DIAGNOSES:  Left shoulder arthritis and rotator cuff arthropathy.  PROCEDURE:  Left reverse shoulder replacement using Biomet components, medium augmented baseplate with 36 standard glenosphere set with 1.5 mm inferior offset, size 9 stem with 40 mm +6 offset tray and standard 36 bearing.  SURGEON:  Cordella RAMAN.  Addie, MD  ASSISTANT:  Herlene Calix, PA  INDICATIONS:  The patient is a 63 year old with left shoulder rotator cuff arthropathy and arthritis who presents for operative management after explanation of the risks and benefits.  DESCRIPTION OF PROCEDURE:  The patient was brought to the operating room where general anesthetic was induced.  Preoperative antibiotics were administered.  Timeout was called.  The patient was placed in the beach chair position with the head in neutral  position.  Left shoulder, arm, and hand were prescrubbed with hydrogen peroxide followed by alcohol and Betadine  allowed to air dry, prepped with ChloraPrep solution and draped in a sterile manner.  After calling timeout and sterile prepping and draping,  including covering the entire operative field with Ioban, a deltopectoral approach was made.  The cephalic vein was mobilized laterally.  The upper 1.5 cm of the pectoralis was released.  Deltopectoral interval was bluntly developed.  Subacromial and  subdeltoid adhesions were manually released.  The anterior portion of the deltoid was elevated off its attachment manually as a sleeve.  Next, the axillary nerve was identified and palpated and protected at all times during the case.  Circumflex vessels  were ligated.  The patient did have an irreparable  supraspinatus rotator cuff tear with retraction.  Significant arthritis was present in the humeral head as well as the glenoid.  The rotator interval was opened.  The subscapularis was mobilized.  The  subscapularis was then released from the lesser tuberosity using a 15 scalpel.  Capsular dissection was then performed circumferentially around to the 7 o'clock position including about 2 cm of capsule off the inferior humeral neck.  At this time, the  head was dislocated.  A reverse retractor was placed.  A rongeur was utilized to get a starting point at the superior aspect of the humeral head posterior to the bicipital groove.  Reaming was then performed up to a size 8.  The head was then cut in 30  degrees of retroversion at the appropriate level of resection.  Next, broaching was performed up to a size 9 with a very good press fit obtained with a 9.  Cap was placed.  Anterior and posterior retractors were then placed on the glenoid.  Using the  patient-specific guide, after removing the superficial cartilage and labrum circumferentially, guide pin was placed.  The reaming was performed in accordance with preoperative templating.  Reaming for the augment was then performed.  The patient had a  shallow vault.  Nonetheless, we did not penetrate the vault with the boss reaming.  Next, the trial was placed and had very good contact.  True implant was then placed in the appropriate rotation with one central compression screw, which obtained very  good purchase and four peripheral locking screws.  Next, we did trial reduction with the standard glenosphere 1.5 mm inferior offset along with a +6 offset humeral tray with standard liner.  This construct  was actually very stable with extension and  adduction along with internal and external rotation.  Hard to dislocate and hard to relocate.  The construct was dislocated.  The humeral stem was removed.  There were some old anchors which were also removed.  IrriSept  solution was utilized after the  incision as well as after the arthrotomy at multiple times during the case.  We did irrigate the canal with IrriSept solution.  Suctioned that out and placed in some vancomycin  powder.  We then drilled suture holes through the lesser tuberosity and  placed 6 SutureTapes and through three holes for subscapularis reattachment.  True glenosphere was then placed onto the dry trunnion Morse taper.  Good fit was obtained.  Next, we placed the True Stem into the suctioned out canal after we placed  vancomycin  into the canal.  We had a very nice fit of the stem in the canal.  Trial reduction was again performed with same stability parameters were maintained.  The true tray and liner were then placed with excellent stability obtained.  Thorough  irrigation was then performed with 3 liters of pouring solution.  Axillary nerve palpated and intact.  Next, the subscapularis was reattached to the lesser tuberosity with the arm in 30 degrees of external rotation.  This was done using NICE knots and  the 6 SutureTapes.  The arm externally rotated to about 55 to 60 degrees after that closure.  Next, IrriSept solution was utilized in the joint and vancomycin  powder placed into the joint on the implant.  Rotator interval closure was performed with  remnant bursal tissue present, which was attached to the subscapularis.  This gave a very nice tight closure.  Next, the deltopectoral interval was then closed using #1 Vicryl suture followed by interrupted inverted 0 Vicryl suture, 2-0 Vicryl suture and  3-0 Monocryl with Steri-Strips and an Aquacel dressing applied.  The patient was then placed supine and extubated.  Shoulder immobilizer placed.  The patient tolerated the procedure well without immediate complications and transferred to the recovery  room in stable condition.  Luke's assistance was required for retraction, opening and closing, mobilization of tissue.  His assistance was a medical  necessity.   PUS D: 11/16/2023 1:44:49 pm T: 11/16/2023 3:52:00 pm  JOB: [id number]/ [id number]

## 2023-11-17 ENCOUNTER — Encounter (HOSPITAL_COMMUNITY): Payer: Self-pay | Admitting: Orthopedic Surgery

## 2023-11-17 DIAGNOSIS — M19012 Primary osteoarthritis, left shoulder: Secondary | ICD-10-CM | POA: Diagnosis not present

## 2023-11-17 LAB — GLUCOSE, CAPILLARY: Glucose-Capillary: 168 mg/dL — ABNORMAL HIGH (ref 70–99)

## 2023-11-17 MED ORDER — DOCUSATE SODIUM 100 MG PO CAPS: 100.0000 mg | ORAL_CAPSULE | Freq: Two times a day (BID) | ORAL | 0 refills | Status: AC

## 2023-11-17 MED ORDER — OXYCODONE HCL 5 MG PO TABS: 5.0000 mg | ORAL_TABLET | ORAL | 0 refills | Status: DC | PRN

## 2023-11-17 MED ORDER — ASPIRIN 81 MG PO TBEC: 81.0000 mg | DELAYED_RELEASE_TABLET | Freq: Every day | ORAL | 12 refills | Status: AC

## 2023-11-17 MED ORDER — METHOCARBAMOL 500 MG PO TABS: 500.0000 mg | ORAL_TABLET | Freq: Four times a day (QID) | ORAL | 0 refills | Status: DC | PRN

## 2023-11-17 NOTE — Anesthesia Postprocedure Evaluation (Signed)
 Anesthesia Post Note  Patient: [name] [name]  Procedure(s) Performed: ARTHROPLASTY, SHOULDER, TOTAL, REVERSE (Left: Shoulder)     Patient location during evaluation: PACU Anesthesia Type: General Level of consciousness: awake and alert Pain management: pain level controlled Vital Signs Assessment: post-procedure vital signs reviewed and stable Respiratory status: spontaneous breathing, nonlabored ventilation and respiratory function stable Cardiovascular status: blood pressure returned to baseline and stable Postop Assessment: no apparent nausea or vomiting Anesthetic complications: no   No notable events documented.  Last Vitals:  Vitals:   11/17/23 0408 11/17/23 0724  BP:  98/72  Pulse: 71 74  Resp:  20  Temp:  36.7 C  SpO2: 93% 97%    Last Pain:  Vitals:   11/17/23 1018  TempSrc:   PainSc: 6                  Garnette FORBES Skillern

## 2023-11-17 NOTE — Progress Notes (Signed)
  Subjective: Pt stable - block has worn off   Objective: Vital signs in last 24 hours: Temp:  [97.5 F (36.4 C)-98.2 F (36.8 C)] 98 F (36.7 C) (07/25 0407) Pulse Rate:  [70-86] 71 (07/25 0408) Resp:  [11-23] 18 (07/25 0407) BP: (83-140)/(47-103) 117/67 (07/25 0407) SpO2:  [89 %-100 %] 93 % (07/25 0408)  Intake/Output from previous day: 07/24 0701 - 07/25 0700 In: 1820 [P.O.:720; I.V.:900; IV Piggyback:200] Out: 75 [Blood:75] Intake/Output this shift: No intake/output data recorded.  Exam:  Intact pulses distally No cellulitis present  Labs: No results for input(s): HGB in the last 72 hours. No results for input(s): WBC, RBC, HCT, PLT in the last 72 hours. No results for input(s): NA, K, CL, CO2, BUN, CREATININE, GLUCOSE, CALCIUM  in the last 72 hours. No results for input(s): LABPT, INR in the last 72 hours.  Assessment/Plan: Plan is ot this am - dc after ot - hhpt/ot to start next week   G [name] [name] 11/17/2023, 7:17 AM

## 2023-11-17 NOTE — Evaluation (Signed)
 Occupational Therapy Evaluation Patient Details Name: [name] [name] MRN: [id number] DOB: 1961-03-12 Today's Date: 11/17/2023   History of Present Illness   Pt is a 63 y/o female presenting for L total reverse arthroplasty. PMH includes: Addison's disease, anxiety, CAD, CHF, DM, Hep C, HIV, PNA, L hand surgery, prior L shoulder surgery x 2.     Clinical Impressions PTA patient independent with ADLs and IADLs, using cane intermittently as needed. Admitted for above and presents with problem list below.  Pt educated on shoulder precautions, L UE exercises, sling mgmt and wear schedule, ADL compensatory techniques, recommendations and use of ICE MAN.  Patient requires intermittent cueing to keep L shoulder relaxed in sling and to avoid movement of shoulder.  She is able to mange ADLs with up to min assist, pt reports she will have support of family who can assist her as needed. She is mobilizing with modified independence.  Completed exercises to elbow, wrist and hand but needing some AAROM/PROM due to nerve block.  Based on performance today, recommend follow up per surgeon.  Will follow acutely.      If plan is discharge home, recommend the following:   A little help with walking and/or transfers;A little help with bathing/dressing/bathroom;Assistance with cooking/housework;Assist for transportation;Help with stairs or ramp for entrance     Functional Status Assessment   Patient has had a recent decline in their functional status and demonstrates the ability to make significant improvements in function in a reasonable and predictable amount of time.     Equipment Recommendations   None recommended by OT (pt reports having access to [facility])     Recommendations for Other Services         Precautions/Restrictions   Precautions Precautions: Fall Recall of Precautions/Restrictions: Intact Precaution/Restrictions Comments: NO AROM shoulder, OK pendulums and hand/wrist/elbow  movement. Required Braces or Orthoses: Sling (unless bathing/dressing, exercises) Restrictions Weight Bearing Restrictions Per Provider Order: Yes LUE Weight Bearing Per Provider Order: Non weight bearing     Mobility Bed Mobility Overal bed mobility: Modified Independent                  Transfers Overall transfer level: Modified independent                        Balance Overall balance assessment: Mild deficits observed, not formally tested                                         ADL either performed or assessed with clinical judgement   ADL Overall ADL's : Needs assistance/impaired     Grooming: Set up;Standing;Oral care Grooming Details (indicate cue type and reason): attempted to use L hand to stabilize toothpaste but unable due to nerve block         Upper Body Dressing : Minimal assistance;Sitting   Lower Body Dressing: Supervision/safety;Sit to/from stand   Toilet Transfer: Ambulation;Modified Independent           Functional mobility during ADLs: Supervision/safety;Cueing for safety       Vision   Vision Assessment?: No apparent visual deficits     Perception         Praxis         Pertinent Vitals/Pain Pain Assessment Pain Assessment: Faces Faces Pain Scale: Hurts a little bit Pain Location: L UE with block intact Pain Descriptors / Indicators:  Discomfort, Operative site guarding Pain Intervention(s): Limited activity within patient's tolerance, Monitored during session, Repositioned     Extremity/Trunk Assessment Upper Extremity Assessment Upper Extremity Assessment: LUE deficits/detail;Right hand dominant LUE Deficits / Details: s/p total reverse arthroplasty.  nerve block intact. LUE Sensation: decreased light touch;decreased proprioception LUE Coordination: decreased fine motor;decreased gross motor   Lower Extremity Assessment Lower Extremity Assessment: Overall WFL for tasks assessed    Cervical / Trunk Assessment Cervical / Trunk Assessment: Other exceptions Cervical / Trunk Exceptions: increased body habitus   Communication Communication Communication: No apparent difficulties   Cognition Arousal: Alert Behavior During Therapy: WFL for tasks assessed/performed Cognition: No apparent impairments                               Following commands: Intact       Cueing  General Comments   Cueing Techniques: Verbal cues  pt reports family can assist intermittently   Exercises Exercises: Shoulder, Other exercises Shoulder Exercises Elbow Flexion: AAROM, Left, 10 reps, Supine Elbow Extension: AAROM, Left, 10 reps, Supine Wrist Flexion: AROM, Left, 10 reps, Supine Wrist Extension: AROM, Left, 10 reps, Supine Digit Composite Flexion: AROM, Left, 10 reps, Supine Neck Lateral Flexion - Right: AROM, 5 reps, Seated Neck Lateral Flexion - Left: AROM, 5 reps, Seated Other Exercises Other Exercises: PROM LUE, forearm supination/pronation x 10 reps supine   Shoulder Instructions Shoulder Instructions Donning/doffing shirt without moving shoulder: Minimal assistance Method for sponge bathing under operated UE: Minimal assistance Donning/doffing sling/immobilizer: Minimal assistance Correct positioning of sling/immobilizer: Supervision/safety Pendulum exercises (written home exercise program):  (discussed and therapist demonstrated, but pt did not complete today) ROM for elbow, wrist and digits of operated UE: Minimal assistance Sling wearing schedule (on at all times/off for ADL's): Modified independent Proper positioning of operated UE when showering: Supervision/safety Positioning of UE while sleeping: Supervision/safety    Home Living Family/patient expects to be discharged to:: Private residence Living Arrangements: Alone Available Help at Discharge: Family;Available PRN/intermittently Type of Home: House Home Access: Stairs to enter [employer] of Steps: 2   Home Layout: One level     Bathroom Shower/Tub: Chief Strategy Officer: Handicapped height Bathroom Accessibility: Yes   Home Equipment: Agricultural consultant (2 wheels);Cane - single point;Grab bars - tub/shower   Additional Comments: pt report she has access to [facility] if needed      Prior Functioning/Environment Prior Level of Function : Independent/Modified Independent             Mobility Comments: cane intermittently ADLs Comments: ind but does not drive    OT Problem List: Decreased strength;Decreased activity tolerance;Pain;Impaired UE functional use;Obesity;Decreased knowledge of precautions;Decreased knowledge of use of DME or AE   OT Treatment/Interventions: Self-care/ADL training;Therapeutic exercise;DME and/or AE instruction;Therapeutic activities;Patient/family education      OT Goals(Current goals can be found in the care plan section)   Acute Rehab OT Goals Patient Stated Goal: home today OT Goal Formulation: With patient Time For Goal Achievement: 12/01/23 Potential to Achieve Goals: Good   OT Frequency:  Min 2X/week    Co-evaluation              AM-PAC OT 6 Clicks Daily Activity     Outcome Measure Help from another person eating meals?: A Little Help from another person taking care of personal grooming?: A Little Help from another person toileting, which includes using toliet, bedpan, or urinal?: A Little Help from  another person bathing (including washing, rinsing, drying)?: A Little Help from another person to put on and taking off regular upper body clothing?: A Little Help from another person to put on and taking off regular lower body clothing?: A Little 6 Click Score: 18   End of Session Equipment Utilized During Treatment: Other (comment) (sling) Nurse Communication: Mobility status  Activity Tolerance: Patient tolerated treatment well Patient left: with call bell/phone within reach;Other (comment)  (sitting EOB)  OT Visit Diagnosis: Other abnormalities of gait and mobility (R26.89);Pain Pain - Right/Left: Left Pain - part of body: Shoulder                Time: 9156-9084 OT Time Calculation (min): 32 min Charges:  OT General Charges $OT Visit: 1 Visit OT Evaluation $OT Eval Moderate Complexity: 1 Mod OT Treatments $Self Care/Home Management : 8-22 mins  [name] [name], OT Acute Rehabilitation Services Office [phone] Secure Chat Preferred    [name] [name] 11/17/2023, 10:17 AM

## 2023-11-17 NOTE — Plan of Care (Signed)
 Pt doing well. Pt given D/C instructions with verbal understanding. Rx's were sent to the pharmacy by MD. Pt's incision is clean and dry with no sign of infection. Pt's IV was removed prior to D/C. Pt D/C'd home via wheelchair per MD order. Pt is stable @ D/C and has no other needs at this time. Rema Fendt, RN

## 2023-11-17 NOTE — Transfer of Care (Signed)
 Immediate Anesthesia Transfer of Care Note  Patient: [name] [name]  Procedure(s) Performed: ARTHROPLASTY, SHOULDER, TOTAL, REVERSE (Left: Shoulder)  Patient Location: PACU  Anesthesia Type:General  Level of Consciousness: awake and alert   Airway & Oxygen Therapy: Patient Spontanous Breathing and Patient connected to nasal cannula oxygen  Post-op Assessment: Report given to RN and Post -op Vital signs reviewed and stable  Post vital signs: Reviewed and stable  Last Vitals:  Vitals Value Taken Time  BP 98/72 11/17/23 07:24  Temp 36.7 C 11/17/23 07:24  Pulse 74 11/17/23 07:24  Resp 20 11/17/23 07:24  SpO2 97 % 11/17/23 07:24    Last Pain:       Complications: No notable events documented.

## 2023-11-19 DIAGNOSIS — M19012 Primary osteoarthritis, left shoulder: Secondary | ICD-10-CM

## 2023-11-20 ENCOUNTER — Other Ambulatory Visit: Payer: Self-pay

## 2023-11-22 ENCOUNTER — Other Ambulatory Visit: Payer: Self-pay

## 2023-11-22 ENCOUNTER — Other Ambulatory Visit: Payer: Self-pay | Admitting: Pharmacy Technician

## 2023-11-22 NOTE — Progress Notes (Signed)
 Specialty Pharmacy Refill Coordination Note  [name] [name] is a 63 y.o. female contacted today regarding refills of specialty medication(s) Tezepelumab -ekko (TEZSPIRE )   Patient requested Courier to Provider Office   Delivery date: 11/23/23   Verified address: [facility] [address] [place], [place] [address]   Medication will be filled on 11/22/23.

## 2023-11-23 ENCOUNTER — Other Ambulatory Visit: Payer: Self-pay | Admitting: *Deleted

## 2023-11-23 NOTE — Patient Outreach (Addendum)
 Complex Care Management   Visit Note  11/23/2023  Name:  [name] [name] MRN: [id number] DOB: Sep 06, 1960  Situation: Referral received for Complex Care Management related to HTN I obtained verbal consent from Patient.  Visit completed with patient  on the phone  Background:   Past Medical History:  Diagnosis Date   Addison's disease (HCC)    Allergic rhinitis 05/09/2006   Allergy     Anxiety    Arthritis    Asthma    Blood dyscrasia    HIV   CAD (coronary artery disease) 03/19/2023   CCTA 03/16/23: CAC score 471 (98th percentile), RCA diffuse 25-49, dLM minimal Ca2+ plaque, LAD prox minimal plaques, pLCx < 24; TPV 542 mm3 (86th percentile)    CHF (congestive heart failure) (HCC)    Chronic back pain    Chronic night sweats 08/23/2022   Depression    Diabetes mellitus without complication (HCC) 04/25/2014   GERD (gastroesophageal reflux disease)    Heart murmur    as a child   Hepatitis C    genotype 1b, 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 s/p 12 week course of simeprevir and sofosbuvir between October 2014 and January 2015 with resolution.   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   Pneumonia    Prediabetes    Prediabetes 02/18/2022   Refusal of blood transfusions as patient is Jehovah's Witness    Screening for cervical cancer 10/20/2022   Secondary adrenal insufficiency (HCC) 06/29/2014   Urticaria    Vaginal atrophy 10/20/2022    Assessment: Patient Reported Symptoms:  Cognitive Cognitive Status: Alert and oriented to person, place, and time, Able to follow simple commands, Normal speech and language skills   Health Maintenance Behaviors: Annual physical exam  Neurological Neurological Review of Symptoms: Dizziness (vertigo history) Neurological Management Strategies:  (Occurs when standing  up to fast. Educated on safety when standing)  HEENT HEENT Symptoms Reported: Other: (Hearing aid to left ear with reported issues today however history of infection) HEENT Management Strategies: Medical device, Routine screening HEENT Self-Management Outcome: 5 (very good) HEENT Comment: hearing aid in left ear Ear problem(s)  Cardiovascular Cardiovascular Symptoms Reported: No symptoms reported Does patient have uncontrolled Hypertension?: Yes Is patient checking Blood Pressure at home?: Yes (Weekly checks) Patient's Recent BP reading at home: 7/16 128/66 & 7/20 124/77 Cardiovascular Management Strategies: Medication therapy, Medical device Weight: 174 lb (78.9 kg)  Respiratory Respiratory Symptoms Reported: Shortness of breath Other Respiratory Symptoms: Continues to have SOB upon exertion and in hot temperature Additional Respiratory Details: Use inhaler Respiratory Management Strategies: Medication therapy  Endocrine Endocrine Symptoms Reported: No symptoms reported Is patient diabetic?: Yes Is patient checking blood sugars at home?: Yes List most recent blood sugar readings, include date and time of day: 132 fasting today    Gastrointestinal Gastrointestinal Symptoms Reported: No symptoms reported      Genitourinary Genitourinary Symptoms Reported: No symptoms reported    Integumentary Integumentary Symptoms Reported: Incision Additional Integumentary Details: healing with no reported issues Skin Management Strategies: Dressing changes Skin Comment: Uses cream  Musculoskeletal Musculoskelatal Symptoms Reviewed: Weakness, Difficulty walking Musculoskeletal Management Strategies: Medical device, Medication therapy Falls in the past year?: No Number of falls in past year: 1 or less Was there an injury with  Fall?: No Fall Risk Category Calculator: 0 Patient Fall Risk Level: Low Fall Risk Patient at Risk for Falls Due to: No Fall Risks  Psychosocial Psychosocial Symptoms  Reported: No symptoms reported     Quality of Family Relationships: supportive Do you feel physically threatened by others?: No      11/23/2023   10:49 AM  Depression screen PHQ 2/9  Decreased Interest 0  Down, Depressed, Hopeless 0  PHQ - 2 Score 0    There were no vitals filed for this visit.  Medications Reviewed Today     Reviewed by Alvia Olam BIRCH, RN (Registered Nurse) on 11/23/23 at 1037  Med List Status: <None>   Medication Order Taking? Sig Documenting Provider Last Dose Status Informant  ACCU-CHEK GUIDE test strip [id number] Yes use to check blood sugar In Vitro Once a day DX: E11.9 for 90 days [provider]  Active Self  amLODipine  (NORVASC ) 10 MG tablet [id number] Yes Take 1 tablet (10 mg total) by mouth daily. Gomez-Caraballo, Maria, MD  Active Self  aspirin  EC 81 MG tablet [id number] Yes Take 1 tablet (81 mg total) by mouth daily. Swallow whole. Addie Cordella Hamilton, MD  Active   atorvastatin  (LIPITOR) 10 MG tablet [id number] Yes Take 0.5 tablets (5 mg total) by mouth daily. Gomez-Caraballo, Maria, MD  Active Self  betamethasone  valerate ointment (VALISONE ) 0.1 % [id number] Yes Apply 1 Application topically 2 (two) times daily. Melvenia Corean SAILOR, NP  Active Self  cabotegravir  & rilpivirine  ER (CABENUVA ) 600 & 900 MG/3ML injection [id number] Yes Inject 1 kit into the muscle every 8 (eight) weeks. Melvenia Corean SAILOR, NP  Active Self  cetirizine  (ZYRTEC ) 10 MG tablet [id number] Yes Take 2 tablets (20 mg total) by mouth 2 (two) times daily. Kozlow, Camellia PARAS, MD  Active Self  chlorthalidone  (HYGROTON ) 25 MG tablet [id number] Yes Take 1 tablet (25 mg total) by mouth daily. Tobie Gaines, DO  Active Self  cholecalciferol  (VITAMIN D3) 25 MCG (1000 UNIT) tablet [id number] Yes Take 3 tablets (3,000 Units total) by mouth daily. Nooruddin, Saad, MD  Active   ciprofloxacin -dexamethasone  (CIPRODEX ) OTIC suspension [id number] Yes Place 4 drops into the left ear as needed (ear infection).  [provider]  Active Self  docusate sodium  (COLACE) 100 MG capsule [id number] Yes Take 1 capsule (100 mg total) by mouth 2 (two) times daily. Addie Cordella Hamilton, MD  Active   ezetimibe  (ZETIA ) 10 MG tablet [id number] Yes Take 1 tablet (10 mg total) by mouth daily. Gomez-Caraballo, Maria, MD  Active Self  famotidine  (PEPCID ) 40 MG tablet [id number] Yes Take 1 tablet (40 mg total) by mouth at bedtime. Elnora Ip, MD  Active Self  Fezolinetant  (VEOZAH ) 45 MG TABS [id number] Yes Take 1 tablet (45 mg total) by mouth daily. Melvenia Corean SAILOR, NP  Active Self  gabapentin  (NEURONTIN ) 400 MG capsule [id number] Yes Take 1 capsule (400 mg total) by mouth at bedtime. Kozlow, Camellia PARAS, MD  Active Self  GLOBAL EASE INJECT PEN NEEDLES 32G X 4 MM MISC [id number] Yes USE AS DIRECTED WITH VICTOZA for 30 [provider]  Active Self  glucose blood test strip [id number] Yes Use as instructed Tobie Gaines, DO  Active Self  hydrocortisone  (CORTEF ) 5 MG tablet [id number] Yes Take 2.5-5 mg by mouth See admin instructions. Take 1 tablet (5 mg) by mouth in the morning (scheduled) & may taken an additional 0.5 tablet (2.5 mg) by mouth in the evening if needed. [provider]  Active Self  Med Note STEPHEN OLIVIA Kitchens Feb 27, 2023  3:23 PM)    liraglutide (VICTOZA) 18 MG/3ML SOPN [id number] Yes Inject 1.8 mg into the skin every evening. (1700) [provider]  Active Self  methocarbamol  (ROBAXIN ) 500 MG tablet [id number] Yes Take 1 tablet (500 mg total) by mouth every 6 (six) hours as needed for muscle spasms. Addie Cordella Hamilton, MD  Active   metoprolol  tartrate (LOPRESSOR ) 50 MG tablet [id number] Yes Take 1 tablet (50 mg total) by mouth 2 (two) times daily. Gomez-Caraballo, Maria, MD  Active Self  montelukast  (SINGULAIR ) 10 MG tablet [id number] Yes Take 1 tablet (10 mg total) by mouth at bedtime. Kozlow, Camellia PARAS, MD  Active Self  nitroGLYCERIN  (NITROSTAT ) 0.4 MG SL tablet  [id number] Yes Place 1 tablet (0.4 mg total) under the tongue every 5 (five) minutes as needed for chest pain. Lou Claretta HERO, MD  Active Self  nortriptyline  (PAMELOR ) 10 MG capsule [id number] Yes TAKE TWO CAPSULES BY MOUTH AT BEDTIME Onita Duos, MD  Active Self  ondansetron  (ZOFRAN ) 4 MG tablet [id number] Yes Take 1 tablet (4 mg total) by mouth every 8 (eight) hours as needed for nausea or vomiting. Stephanie Freund, MD  Active Self  oxyCODONE  (OXY IR/ROXICODONE ) 5 MG immediate release tablet [id number] Yes Take 1-2 tablets (5-10 mg total) by mouth every 4 (four) hours as needed for moderate pain (pain score 4-6) (pain score 4-6; 5 mg for pain score 4-5, 10 mg for pain score 6). Addie Cordella Hamilton, MD  Active   pantoprazole  (PROTONIX ) 40 MG tablet [id number] Yes Take 1 tablet (40 mg total) by mouth 2 (two) times daily. Gomez-Caraballo, Maria, MD  Active Self  potassium chloride  SA (KLOR-CON  M) 20 MEQ tablet [id number] Yes Take 2 tablets (40 mEq total) by mouth daily. Lelon Hamilton T, PA-C  Active Self  psyllium (HYDROCIL/METAMUCIL) 95 % PACK [id number] Yes Take 1 packet by mouth daily. [provider]  Active Self  sertraline  (ZOLOFT ) 100 MG tablet [id number] Yes Take 1 tablet (100 mg total) by mouth daily. Gomez-Caraballo, Maria, MD  Active Self  sodium chloride  (OCEAN) 0.65 % SOLN nasal spray [id number] Yes PLACE 1 SPRAY INTO BOTH NOSTRILS AS NEEDED FOR CONGESTION.  Patient taking differently: Place 1 spray into both nostrils in the morning, at noon, in the evening, and at bedtime.   Rosan Harlene Fickle, DO  Active Self  SUMAtriptan  (IMITREX ) 25 MG tablet [id number] Yes Take 1 tablet (25 mg total) by mouth every 2 (two) hours as needed for migraine. May repeat in 2 hours if headache persists or recurs. Onita Duos, MD  Active Self  SYMBICORT  160-4.5 MCG/ACT inhaler [id number] Yes Inhale 2 puffs into the lungs in the morning and at bedtime. Kozlow, Eric J, MD  Active Self  Tezepelumab -ekko  (TEZSPIRE ) 210 MG/1. SOAJ [id number] Yes Inject 210 mg into the skin every 28 (twenty-eight) days. Kozlow, Eric J, MD  Active Self  tezepelumab -ekko (TEZSPIRE ) 210 MG/1. syringe 210 mg [id number]   Kozlow, Eric J, MD  Active   triamcinolone  (NASACORT ) 55 MCG/ACT AERO nasal inhaler [id number] Yes Place 1 spray into the nose 2 (two) times daily. 1 spray each nostril 2 times per day Maurilio Camellia PARAS, MD  Active Self  VENTOLIN  HFA 108 (90 Base) MCG/ACT inhaler [id number] Yes INHALE TWO PUFFS INTO THE LUNGS EVERY 4 (FOUR) HOURS AS NEEDED FOR WHEEZING OR SHORTNESS OF BREATH. Kozlow, Camellia PARAS, MD  Active Self  Recommendation:   PCP Follow-up Continue Current Plan of Care  Follow Up Plan:   Telephone follow up appointment date/time:  12/12/2023 @ 2:30 pm   Olam Ku, RN, BSN [place]  [facility], [facility] RN Care Manager Direct Dial: [phone]  Fax: [phone]

## 2023-11-23 NOTE — Patient Instructions (Signed)
 Visit Information  Thank you for taking time to visit with me today. Please don't hesitate to contact me if I can be of assistance to you before our next scheduled appointment.  Your next care management appointment is by telephone on 12/12/2023 at 2:30 PM   Please call the care guide team at [phone] if you need to cancel, schedule, or reschedule an appointment.   Please call the Suicide and Crisis Lifeline: 988 call the [place]  National Suicide Prevention Lifeline: 782-785-4138 or TTY: 913-614-0799 TTY 856-027-1258) to talk to a trained counselor call 1-800-273-TALK (toll free, 24 hour hotline) if you are experiencing a Mental Health or Behavioral Health Crisis or need someone to talk to.   Olam Ku, RN, BSN [place]  [facility], [facility] RN Care Manager Direct Dial: [phone]  Fax: [phone]

## 2023-11-27 ENCOUNTER — Ambulatory Visit

## 2023-11-27 DIAGNOSIS — J4550 Severe persistent asthma, uncomplicated: Secondary | ICD-10-CM | POA: Diagnosis not present
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
## 2023-11-30 ENCOUNTER — Encounter: Payer: Self-pay | Admitting: Surgical

## 2023-11-30 ENCOUNTER — Other Ambulatory Visit (INDEPENDENT_AMBULATORY_CARE_PROVIDER_SITE_OTHER): Payer: Self-pay

## 2023-11-30 ENCOUNTER — Ambulatory Visit (INDEPENDENT_AMBULATORY_CARE_PROVIDER_SITE_OTHER): Admitting: Surgical

## 2023-11-30 ENCOUNTER — Ambulatory Visit: Attending: Orthopedic Surgery

## 2023-11-30 ENCOUNTER — Telehealth: Payer: Self-pay | Admitting: *Deleted

## 2023-11-30 DIAGNOSIS — G8929 Other chronic pain: Secondary | ICD-10-CM | POA: Insufficient documentation

## 2023-11-30 DIAGNOSIS — Z96612 Presence of left artificial shoulder joint: Secondary | ICD-10-CM

## 2023-11-30 DIAGNOSIS — M19012 Primary osteoarthritis, left shoulder: Secondary | ICD-10-CM | POA: Diagnosis not present

## 2023-11-30 DIAGNOSIS — M6281 Muscle weakness (generalized): Secondary | ICD-10-CM | POA: Insufficient documentation

## 2023-11-30 DIAGNOSIS — M25512 Pain in left shoulder: Secondary | ICD-10-CM | POA: Insufficient documentation

## 2023-11-30 DIAGNOSIS — M25612 Stiffness of left shoulder, not elsewhere classified: Secondary | ICD-10-CM | POA: Insufficient documentation

## 2023-11-30 MED ORDER — METHOCARBAMOL 500 MG PO TABS: 500.0000 mg | ORAL_TABLET | Freq: Four times a day (QID) | ORAL | 0 refills | Status: AC | PRN

## 2023-11-30 MED ORDER — TRAMADOL HCL 50 MG PO TABS: 50.0000 mg | ORAL_TABLET | Freq: Four times a day (QID) | ORAL | 0 refills | Status: AC | PRN

## 2023-11-30 NOTE — Telephone Encounter (Signed)
 Will forward to yellow team.  We have not gotten the Flu Shots in as of yet .  The Clinics does not have the Covid Vaccine, Copied from CRM [id number]. Topic: Clinical - Request for Lab/Test Order >> Nov 29, 2023  4:31 PM Zane F wrote: Reason for CRM:   The patient is calling in to see if it is too soon for her to receive a flu and covid vaccine. Patient would like to come in office to have these administered.   Please call patient to confirm if this is possible.   Callback Number: [id number]
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
## 2023-11-30 NOTE — Discharge Summary (Signed)
 Physician Discharge Summary      [name] MRN: [id number] DOB/AGE: March 12, 1961 63 y.o.  Admit date: 11/16/2023 Discharge date: 11/17/2023  Admission Diagnoses:  Principal Problem:   S/P reverse total shoulder arthroplasty, left Active Problems:   Arthritis of left shoulder   Discharge Diagnoses:  Same  Surgeries: Procedure(s): ARTHROPLASTY, SHOULDER, TOTAL, REVERSE on 11/16/2023   Consultants:   Discharged Condition: Stable  Hospital Course: [name] [name] is an 63 y.o. female who was admitted 11/16/2023 with a chief complaint of left shoulder pain, and found to have a diagnosis of left shoulder arthritis.  They were brought to the operating room on 11/16/2023 and underwent the above named procedures.  Pt awoke from anesthesia without complication and was transferred to the floor. On POD1, patient's pain was overall controlled.  Blocks had worn off on POD 1 .  She was discharged home on POD 1 without any red flag signs or symptoms throughout her stay..  Pt will f/u with Dr. Addie in clinic in ~2 weeks.   Antibiotics given:  Anti-infectives (From admission, onward)    Start     Dose/Rate Route Frequency Ordered Stop   11/16/23 1630  ceFAZolin  (ANCEF ) IVPB 2g/100 mL premix        2 g 200 mL/hr over 30 Minutes Intravenous Every 8 hours 11/16/23 1544 11/17/23 0715   11/16/23 1208  vancomycin  (VANCOCIN ) powder  Status:  Discontinued          As needed 11/16/23 1209 11/16/23 1322   11/16/23 0700  ceFAZolin  (ANCEF ) IVPB 2g/100 mL premix        2 g 200 mL/hr over 30 Minutes Intravenous On call to O.R. 11/16/23 9347 11/16/23 1025     .  Recent vital signs:  Vitals:   11/17/23 0408 11/17/23 0724  BP:  98/72  Pulse: 71 74  Resp:  20  Temp:  98.1 F (36.7 C)  SpO2: 93% 97%    Recent laboratory studies:  Results for orders placed or performed during the hospital encounter of 11/16/23  Glucose, capillary   Collection Time: 11/16/23  6:57 AM  Result Value Ref  Range   Glucose-Capillary 169 (H) 70 - 99 mg/dL   Comment 1 Notify RN   Glucose, capillary   Collection Time: 11/16/23  8:07 AM  Result Value Ref Range   Glucose-Capillary 150 (H) 70 - 99 mg/dL  Glucose, capillary   Collection Time: 11/16/23  1:28 PM  Result Value Ref Range   Glucose-Capillary 246 (H) 70 - 99 mg/dL  Glucose, capillary   Collection Time: 11/16/23  4:53 PM  Result Value Ref Range   Glucose-Capillary 206 (H) 70 - 99 mg/dL  Glucose, capillary   Collection Time: 11/16/23  9:27 PM  Result Value Ref Range   Glucose-Capillary 216 (H) 70 - 99 mg/dL   Comment 1 Notify RN    Comment 2 Document in Chart   Glucose, capillary   Collection Time: 11/17/23  6:01 AM  Result Value Ref Range   Glucose-Capillary 168 (H) 70 - 99 mg/dL   Comment 1 Notify RN    Comment 2 Document in Chart    *Note: Due to a large number of results and/or encounters for the requested time period, some results have not been displayed. A complete set of results can be found in Results Review.    Discharge Medications:   Allergies as of 11/17/2023       Reactions   Acetaminophen  Other (See Comments)  Inflamed liver, hospitalized Other Reaction(s): liver demage   Morphine  Sulfate Hives, Shortness Of Breath   Triamterene Hives   Aspirin -caffeine Diarrhea, Other (See Comments)   liver damage; upset stomach Other Reaction(s): liver demage   Dyazide [hydrochlorothiazide -triamterene] Hives   Empagliflozin Diarrhea   (Jardiance) stomach ache   Metformin  Hcl Er Other (See Comments), Nausea Only   upset stomach   Topamax  [topiramate ] Other (See Comments)   Vision disturbances.   Citalopram  Itching, Rash, Other (See Comments)   Emtricitabine -tenofovir  Df Rash   Descovy    Lisinopril  Other (See Comments)   Cough    Losartan  Nausea Only, Rash   Pt had rash, worsening dizziness, and nausea after starting losartan , which improved after stopping losartan    Triamterene-hctz Rash        Medication  List     TAKE these medications    Accu-Chek Guide test strip Generic drug: glucose blood use to check blood sugar In Vitro Once a day DX: E11.9 for 90 days   glucose blood test strip Use as instructed   amLODipine  10 MG tablet Commonly known as: NORVASC  Take 1 tablet (10 mg total) by mouth daily.   aspirin  EC 81 MG tablet Take 1 tablet (81 mg total) by mouth daily. Swallow whole.   atorvastatin  10 MG tablet Commonly known as: LIPITOR Take 0.5 tablets (5 mg total) by mouth daily. What changed: when to take this   betamethasone  valerate ointment 0.1 % Commonly known as: VALISONE  Apply 1 Application topically 2 (two) times daily.   Cabenuva  600 & 900 MG/3ML injection Generic drug: cabotegravir  & rilpivirine  ER Inject 1 kit into the muscle every 8 (eight) weeks.   cetirizine  10 MG tablet Commonly known as: ZYRTEC  Take 2 tablets (20 mg total) by mouth 2 (two) times daily. What changed: when to take this   chlorthalidone  25 MG tablet Commonly known as: HYGROTON  Take 1 tablet (25 mg total) by mouth daily.   cholecalciferol  25 MCG (1000 UNIT) tablet Commonly known as: VITAMIN D3 Take 3 tablets (3,000 Units total) by mouth daily.   ciprofloxacin -dexamethasone  OTIC suspension Commonly known as: CIPRODEX  Place 4 drops into the left ear as needed (ear infection).   docusate sodium  100 MG capsule Commonly known as: COLACE Take 1 capsule (100 mg total) by mouth 2 (two) times daily.   ezetimibe  10 MG tablet Commonly known as: ZETIA  Take 1 tablet (10 mg total) by mouth daily. What changed: when to take this   famotidine  40 MG tablet Commonly known as: PEPCID  Take 1 tablet (40 mg total) by mouth at bedtime.   gabapentin  400 MG capsule Commonly known as: Neurontin  Take 1 capsule (400 mg total) by mouth at bedtime. What changed: Another medication with the same name was removed. Continue taking this medication, and follow the directions you see here.   Global Ease  Inject Pen Needles 32G X 4 MM Misc Generic drug: Insulin  Pen Needle USE AS DIRECTED WITH VICTOZA for 30   hydrocortisone  5 MG tablet Commonly known as: CORTEF  Take 2.5-5 mg by mouth See admin instructions. Take 1 tablet (5 mg) by mouth in the morning (scheduled) & may taken an additional 0.5 tablet (2.5 mg) by mouth in the evening if needed.   liraglutide 18 MG/3ML Sopn Commonly known as: VICTOZA Inject 1.8 mg into the skin every evening. (1700)   methocarbamol  500 MG tablet Commonly known as: ROBAXIN  Take 1 tablet (500 mg total) by mouth every 6 (six) hours as needed for muscle spasms.  metoprolol  tartrate 50 MG tablet Commonly known as: LOPRESSOR  Take 1 tablet (50 mg total) by mouth 2 (two) times daily.   montelukast  10 MG tablet Commonly known as: SINGULAIR  Take 1 tablet (10 mg total) by mouth at bedtime.   nitroGLYCERIN  0.4 MG SL tablet Commonly known as: NITROSTAT  Place 1 tablet (0.4 mg total) under the tongue every 5 (five) minutes as needed for chest pain.   nortriptyline  10 MG capsule Commonly known as: PAMELOR  TAKE TWO CAPSULES BY MOUTH AT BEDTIME   ondansetron  4 MG tablet Commonly known as: Zofran  Take 1 tablet (4 mg total) by mouth every 8 (eight) hours as needed for nausea or vomiting.   oxyCODONE  5 MG immediate release tablet Commonly known as: Oxy IR/ROXICODONE  Take 1-2 tablets (5-10 mg total) by mouth every 4 (four) hours as needed for moderate pain (pain score 4-6) (pain score 4-6; 5 mg for pain score 4-5, 10 mg for pain score 6).   pantoprazole  40 MG tablet Commonly known as: PROTONIX  Take 1 tablet (40 mg total) by mouth 2 (two) times daily.   potassium chloride  SA 20 MEQ tablet Commonly known as: KLOR-CON  M Take 2 tablets (40 mEq total) by mouth daily.   psyllium 95 % Pack Commonly known as: HYDROCIL/METAMUCIL Take 1 packet by mouth daily.   sertraline  100 MG tablet Commonly known as: ZOLOFT  Take 1 tablet (100 mg total) by mouth daily.    sodium chloride  0.65 % Soln nasal spray Commonly known as: OCEAN PLACE 1 SPRAY INTO BOTH NOSTRILS AS NEEDED FOR CONGESTION. What changed: when to take this   SUMAtriptan  25 MG tablet Commonly known as: Imitrex  Take 1 tablet (25 mg total) by mouth every 2 (two) hours as needed for migraine. May repeat in 2 hours if headache persists or recurs.   Symbicort  160-4.5 MCG/ACT inhaler Generic drug: budesonide -formoterol Inhale 2 puffs into the lungs in the morning and at bedtime.   Tezspire  210 MG/1. Soaj Generic drug: Tezepelumab -ekko Inject 210 mg into the skin every 28 (twenty-eight) days.   triamcinolone  55 MCG/ACT Aero nasal inhaler Commonly known as: NASACORT  Place 1 spray into the nose 2 (two) times daily. 1 spray each nostril 2 times per day   Ventolin  HFA 108 (90 Base) MCG/ACT inhaler Generic drug: albuterol  INHALE TWO PUFFS INTO THE LUNGS EVERY 4 (FOUR) HOURS AS NEEDED FOR WHEEZING OR SHORTNESS OF BREATH.   Veozah  45 MG Tabs Generic drug: Fezolinetant  Take 1 tablet (45 mg total) by mouth daily.        Diagnostic Studies: DG Shoulder Left Port Result Date: 11/16/2023 CLINICAL DATA:  Status post shoulder surgery. EXAM: LEFT SHOULDER COMPARISON:  Preoperative CT FINDINGS: Reverse left shoulder arthroplasty in expected alignment. No periprosthetic lucency or fracture. Recent postsurgical change includes air and edema in the joint space and soft tissues. Stable appearance of the acromioclavicular joint. IMPRESSION: Reverse left shoulder arthroplasty without immediate postoperative complication. Electronically Signed   By: Andrea Gasman M.D.   On: 11/16/2023 14:36    Disposition: Discharge disposition: 01-Home or Self Care       Discharge Instructions     Call MD / Call 911   Complete by: As directed    If you experience chest pain or shortness of breath, CALL 911 and be transported to the hospital emergency room.  If you develope a fever above 101 F, pus (white  drainage) or increased drainage or redness at the wound, or calf pain, call your surgeon's office.   Call MD /  Call 911   Complete by: As directed    If you experience chest pain or shortness of breath, CALL 911 and be transported to the hospital emergency room.  If you develope a fever above 101 F, pus (white drainage) or increased drainage or redness at the wound, or calf pain, call your surgeon's office.   Constipation Prevention   Complete by: As directed    Drink plenty of fluids.  Prune juice may be helpful.  You may use a stool softener, such as Colace (over the counter) 100 mg twice a day.  Use MiraLax (over the counter) for constipation as needed.   Constipation Prevention   Complete by: As directed    Drink plenty of fluids.  Prune juice may be helpful.  You may use a stool softener, such as Colace (over the counter) 100 mg twice a day.  Use MiraLax (over the counter) for constipation as needed.   Diet - low sodium heart healthy   Complete by: As directed    Diet - low sodium heart healthy   Complete by: As directed    Discharge instructions   Complete by: As directed    Okay to come out of the sling to shower.  -Okay to start doing pendulum exercises Saturday 30 repetitions clockwise 30 repetitions counterclockwise 4 times a day.  -No lifting with right arm  -   Discharge instructions   Complete by: As directed    Okay to start pendulum exercises on Saturday 30 rotations clockwise and 30 rotations counterclockwise 4 times a day     No lifting with left arm.  When not doing exercises stay in sling   okay to shower dressings are waterproof     Per [facility] clinic policy, our goal is ensure optimal postoperative pain control with a multimodal pain management strategy. For all [facility] patients, our goal is to wean post-operative narcotic medications by 6 weeks post-operatively. If this is not possible due to utilization of pain medication prior to surgery, your [facility] doctor will  support your acute post-operative pain control for the first 6 weeks postoperatively, with a plan to transition you back to your primary pain team following that. Maralee will work to ensure a Therapist, occupational.   Face-to-face encounter (required for Medicare/Medicaid patients)   Complete by: As directed    I KANDICE Glendia Hutchinson certify that this patient is under my care and that I, or a nurse practitioner or physician's assistant working with me, had a face-to-face encounter that meets the physician face-to-face encounter requirements with this patient on 11/17/2023. The encounter with the patient was in whole, or in part for the following medical condition(s) which is the primary reason for home health care (List medical condition): Left shoulder pain status post left shoulder replacement.  Patient needs occupational therapy 2 times a week for 2 weeks for passive range of motion and active assisted range of motion.   The encounter with the patient was in whole, or in part, for the following medical condition, which is the primary reason for home health care: Left shoulder arthritis   I certify that, based on my findings, the following services are medically necessary home health services: Physical therapy   Reason for Medically Necessary Home Health Services: Therapy- Therapeutic Exercises to Increase Strength and Endurance   My clinical findings support the need for the above services: Pain interferes with ambulation/mobility   Further, I certify that my clinical findings support that this patient is homebound due  to: Pain interferes with ambulation/mobility   Increase activity slowly as tolerated   Complete by: As directed    Increase activity slowly as tolerated   Complete by: As directed    Post-operative opioid taper instructions:   Complete by: As directed    POST-OPERATIVE OPIOID TAPER INSTRUCTIONS: It is important to wean off of your opioid medication as soon as possible. If you do not need pain  medication after your surgery it is ok to stop day one. Opioids include: Codeine , Hydrocodone (Norco, Vicodin), Oxycodone (Percocet, oxycontin ) and hydromorphone  amongst others.  Long term and even short term use of opiods can cause: Increased pain response Dependence Constipation Depression Respiratory depression And more.  Withdrawal symptoms can include Flu like symptoms Nausea, vomiting And more Techniques to manage these symptoms Hydrate well Eat regular healthy meals Stay active Use relaxation techniques(deep breathing, meditating, yoga) Do Not substitute Alcohol to help with tapering If you have been on opioids for less than two weeks and do not have pain than it is ok to stop all together.  Plan to wean off of opioids This plan should start within one week post op of your joint replacement. Maintain the same interval or time between taking each dose and first decrease the dose.  Cut the total daily intake of opioids by one tablet each day Next start to increase the time between doses. The last dose that should be eliminated is the evening dose.      Post-operative opioid taper instructions:   Complete by: As directed    POST-OPERATIVE OPIOID TAPER INSTRUCTIONS: It is important to wean off of your opioid medication as soon as possible. If you do not need pain medication after your surgery it is ok to stop day one. Opioids include: Codeine , Hydrocodone (Norco, Vicodin), Oxycodone (Percocet, oxycontin ) and hydromorphone  amongst others.  Long term and even short term use of opiods can cause: Increased pain response Dependence Constipation Depression Respiratory depression And more.  Withdrawal symptoms can include Flu like symptoms Nausea, vomiting And more Techniques to manage these symptoms Hydrate well Eat regular healthy meals Stay active Use relaxation techniques(deep breathing, meditating, yoga) Do Not substitute Alcohol to help with tapering If you have been  on opioids for less than two weeks and do not have pain than it is ok to stop all together.  Plan to wean off of opioids This plan should start within one week post op of your joint replacement. Maintain the same interval or time between taking each dose and first decrease the dose.  Cut the total daily intake of opioids by one tablet each day Next start to increase the time between doses. The last dose that should be eliminated is the evening dose.             SignedBETHA Herlene Calix 11/30/2023, 7:10 AM
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
## 2023-12-04 ENCOUNTER — Ambulatory Visit

## 2023-12-04 DIAGNOSIS — G8929 Other chronic pain: Secondary | ICD-10-CM

## 2023-12-04 DIAGNOSIS — Z96612 Presence of left artificial shoulder joint: Secondary | ICD-10-CM

## 2023-12-04 DIAGNOSIS — M6281 Muscle weakness (generalized): Secondary | ICD-10-CM

## 2023-12-04 DIAGNOSIS — M25612 Stiffness of left shoulder, not elsewhere classified: Secondary | ICD-10-CM

## 2023-12-07 ENCOUNTER — Ambulatory Visit: Admitting: Physical Therapy

## 2023-12-07 ENCOUNTER — Encounter: Payer: Self-pay | Admitting: Physical Therapy

## 2023-12-07 DIAGNOSIS — G8929 Other chronic pain: Secondary | ICD-10-CM

## 2023-12-07 DIAGNOSIS — M6281 Muscle weakness (generalized): Secondary | ICD-10-CM

## 2023-12-07 DIAGNOSIS — Z96612 Presence of left artificial shoulder joint: Secondary | ICD-10-CM

## 2023-12-07 DIAGNOSIS — M25612 Stiffness of left shoulder, not elsewhere classified: Secondary | ICD-10-CM
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
## 2023-12-08 ENCOUNTER — Other Ambulatory Visit: Payer: Self-pay

## 2023-12-08 ENCOUNTER — Other Ambulatory Visit: Payer: Self-pay | Admitting: Pharmacist

## 2023-12-08 ENCOUNTER — Other Ambulatory Visit (HOSPITAL_COMMUNITY): Payer: Self-pay

## 2023-12-08 ENCOUNTER — Ambulatory Visit: Admitting: Infectious Diseases

## 2023-12-08 DIAGNOSIS — B20 Human immunodeficiency virus [HIV] disease: Secondary | ICD-10-CM

## 2023-12-08 MED ORDER — CABOTEGRAVIR & RILPIVIRINE ER 600 & 900 MG/3ML IM SUER
1.0000 | INTRAMUSCULAR | 5 refills | Status: AC
  Filled 2023-12-08 – 2023-12-15 (×2): qty 6, 34d supply, fill #0
  Filled 2024-02-22: qty 6, 34d supply, fill #1
  Filled 2024-04-01: qty 6, 34d supply, fill #2

## 2023-12-08 NOTE — Progress Notes (Signed)
 Specialty Pharmacy Refill Coordination Note  [name] [name] is a 63 y.o. female assessed today regarding refills of clinic administered specialty medication(s) Cabotegravir  & Rilpivirine  (CABENUVA )   Clinic requested Courier to Provider Office   Delivery date: 12/18/23   Verified address: [address] [place] [address]   Medication will be filled on 12/15/23.

## 2023-12-11 ENCOUNTER — Ambulatory Visit: Admitting: Physical Therapy

## 2023-12-11 ENCOUNTER — Ambulatory Visit

## 2023-12-11 DIAGNOSIS — M6281 Muscle weakness (generalized): Secondary | ICD-10-CM

## 2023-12-11 DIAGNOSIS — Z96612 Presence of left artificial shoulder joint: Secondary | ICD-10-CM
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
## 2023-12-12 ENCOUNTER — Encounter: Payer: Self-pay | Admitting: *Deleted

## 2023-12-12 ENCOUNTER — Telehealth: Payer: Self-pay | Admitting: *Deleted

## 2023-12-12 NOTE — Patient Instructions (Signed)
 [name] - I am sorry I was unable to reach you today for our scheduled appointment. I work with Elnora Ip, MD and am calling to support your healthcare needs. Please contact me at [phone] at your earliest convenience. I look forward to speaking with you soon.   Thank you,   Olam Ku, RN, BSN [place]  [facility], [facility] RN Care Manager Direct Dial: [phone]  Fax: [phone]

## 2023-12-13 ENCOUNTER — Other Ambulatory Visit (HOSPITAL_COMMUNITY): Payer: Self-pay

## 2023-12-14 ENCOUNTER — Ambulatory Visit

## 2023-12-14 DIAGNOSIS — Z96612 Presence of left artificial shoulder joint: Secondary | ICD-10-CM | POA: Diagnosis not present

## 2023-12-14 DIAGNOSIS — G8929 Other chronic pain: Secondary | ICD-10-CM

## 2023-12-14 DIAGNOSIS — M6281 Muscle weakness (generalized): Secondary | ICD-10-CM

## 2023-12-14 DIAGNOSIS — M25612 Stiffness of left shoulder, not elsewhere classified: Secondary | ICD-10-CM
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
## 2023-12-15 ENCOUNTER — Other Ambulatory Visit: Payer: Self-pay

## 2023-12-18 ENCOUNTER — Ambulatory Visit

## 2023-12-18 ENCOUNTER — Other Ambulatory Visit: Payer: Self-pay

## 2023-12-18 DIAGNOSIS — M6281 Muscle weakness (generalized): Secondary | ICD-10-CM

## 2023-12-18 DIAGNOSIS — Z96612 Presence of left artificial shoulder joint: Secondary | ICD-10-CM | POA: Diagnosis not present

## 2023-12-18 DIAGNOSIS — M25612 Stiffness of left shoulder, not elsewhere classified: Secondary | ICD-10-CM
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
## 2023-12-19 ENCOUNTER — Telehealth: Payer: Self-pay

## 2023-12-19 NOTE — Telephone Encounter (Signed)
 [facility] Patient Advocate Encounter  Patient's medications [place]  have been couriered to [facility] from [facility] and will be administered at the patients appointment on 12/26/23.  Arland Hutchinson, CPhT Specialty Pharmacy Patient [facility] Phone: [phone] Fax:  [phone]

## 2023-12-20 ENCOUNTER — Telehealth: Payer: Self-pay

## 2023-12-20 ENCOUNTER — Other Ambulatory Visit (HOSPITAL_COMMUNITY): Payer: Self-pay

## 2023-12-20 ENCOUNTER — Ambulatory Visit

## 2023-12-20 DIAGNOSIS — Z96612 Presence of left artificial shoulder joint: Secondary | ICD-10-CM

## 2023-12-20 DIAGNOSIS — M6281 Muscle weakness (generalized): Secondary | ICD-10-CM

## 2023-12-20 DIAGNOSIS — M25612 Stiffness of left shoulder, not elsewhere classified: Secondary | ICD-10-CM

## 2023-12-20 NOTE — Telephone Encounter (Signed)
 I called - pls have her seen tomorrow for left hand and wrist xrays maybe by ronal dragon thx
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
## 2023-12-21 ENCOUNTER — Telehealth: Payer: Self-pay

## 2023-12-21 NOTE — Progress Notes (Signed)
 HPI: [name] [name] is a 63 y.o. female who presents to the [facility] pharmacy clinic for Cabenuva  administration.  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   Osteoporosis 10/24/2023   CAD (coronary artery disease) 03/19/2023   Chest pain 01/30/2023   Type 2 diabetes mellitus (HCC) 01/30/2023   Osteopenia after menopause 03/15/2022   Anxiety 03/15/2022   Impingement syndrome of left shoulder 07/01/2021   Osteoarthritis of thumb, right 11/29/2018   Chronic migraine w/o aura w/o status migrainosus, not intractable 08/27/2018   Central perforation of tympanic membrane of left ear 12/09/2016   Eustachian tube dysfunction, left 03/09/2016   Spondylosis of lumbar region without myelopathy or radiculopathy 10/08/2015   Liver fibrosis (HCC) 12/04/2014   Addison's disease (HCC) 10/23/2014   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   Vitamin D  deficiency 06/29/2014   Secondary adrenal insufficiency (HCC) 06/29/2014   History of tympanostomy tube placement 02/07/2014   Hepatitis C virus infection cured after antiviral drug therapy 03/16/2012   Health care maintenance 12/15/2011   HTN (hypertension) 10/05/2010   Hyperlipidemia associated with type 2 diabetes mellitus (HCC) 10/23/2008   Insomnia 02/14/2007   HIV disease (HCC) 05/09/2006   Allergic rhinitis 05/09/2006   GERD 05/09/2006    Patient's Medications  New Prescriptions   No medications on file  Previous Medications   ACCU-CHEK GUIDE TEST STRIP    use to check blood sugar In Vitro Once a day DX: E11.9 for 90 days   AMLODIPINE  (NORVASC ) 10 MG TABLET    Take 1 tablet (10 mg total) by mouth daily.   ASPIRIN  EC 81 MG TABLET    Take 1 tablet (81 mg total) by mouth daily. Swallow whole.   ATORVASTATIN  (LIPITOR) 10 MG TABLET    Take 0.5 tablets (5 mg total) by mouth daily.    BETAMETHASONE  VALERATE OINTMENT (VALISONE ) 0.1 %    Apply 1 Application topically 2 (two) times daily.   CABOTEGRAVIR  & RILPIVIRINE  ER (CABENUVA ) 600 & 900 MG/3ML INJECTION    Inject 1 kit into the muscle every 2 (two) months.   CETIRIZINE  (ZYRTEC ) 10 MG TABLET    Take 2 tablets (20 mg total) by mouth 2 (two) times daily.   CHLORTHALIDONE  (HYGROTON ) 25 MG TABLET    Take 1 tablet (25 mg total) by mouth daily.   CHOLECALCIFEROL  (VITAMIN D3) 25 MCG (1000 UNIT) TABLET    Take 3 tablets (3,000 Units total) by mouth daily.   CIPROFLOXACIN -DEXAMETHASONE  (CIPRODEX ) OTIC SUSPENSION    Place 4 drops into the left ear as needed (ear infection).   DOCUSATE SODIUM  (COLACE) 100 MG CAPSULE    Take 1 capsule (100 mg total) by mouth 2 (two) times daily.   EZETIMIBE  (ZETIA ) 10 MG TABLET    Take 1 tablet (10 mg total) by mouth daily.   FAMOTIDINE  (PEPCID ) 40 MG TABLET    Take 1 tablet (40 mg total) by mouth at bedtime.   FEZOLINETANT  (VEOZAH ) 45 MG TABS    Take 1 tablet (45 mg total) by mouth daily.   GABAPENTIN  (NEURONTIN ) 400 MG CAPSULE    Take 1 capsule (400 mg total) by mouth at bedtime.   GLOBAL EASE INJECT PEN NEEDLES 32G X 4 MM MISC    USE AS DIRECTED WITH VICTOZA for 30   GLUCOSE BLOOD TEST STRIP    Use as instructed  HYDROCORTISONE  (CORTEF ) 5 MG TABLET    Take 2.5-5 mg by mouth See admin instructions. Take 1 tablet (5 mg) by mouth in the morning (scheduled) & may taken an additional 0.5 tablet (2.5 mg) by mouth in the evening if needed.   LIRAGLUTIDE (VICTOZA) 18 MG/3ML SOPN    Inject 1.8 mg into the skin every evening. (1700)   METHOCARBAMOL  (ROBAXIN ) 500 MG TABLET    Take 1 tablet (500 mg total) by mouth every 6 (six) hours as needed for muscle spasms.   METOPROLOL  TARTRATE (LOPRESSOR ) 50 MG TABLET    Take 1 tablet (50 mg total) by mouth 2 (two) times daily.   MONTELUKAST  (SINGULAIR ) 10 MG TABLET    Take 1 tablet (10 mg total) by mouth at bedtime.   NITROGLYCERIN  (NITROSTAT ) 0.4 MG SL TABLET    Place 1  tablet (0.4 mg total) under the tongue every 5 (five) minutes as needed for chest pain.   NORTRIPTYLINE  (PAMELOR ) 10 MG CAPSULE    TAKE TWO CAPSULES BY MOUTH AT BEDTIME   ONDANSETRON  (ZOFRAN ) 4 MG TABLET    Take 1 tablet (4 mg total) by mouth every 8 (eight) hours as needed for nausea or vomiting.   PANTOPRAZOLE  (PROTONIX ) 40 MG TABLET    Take 1 tablet (40 mg total) by mouth 2 (two) times daily.   POTASSIUM CHLORIDE  SA (KLOR-CON  M) 20 MEQ TABLET    Take 2 tablets (40 mEq total) by mouth daily.   PSYLLIUM (HYDROCIL/METAMUCIL) 95 % PACK    Take 1 packet by mouth daily.   SERTRALINE  (ZOLOFT ) 100 MG TABLET    Take 1 tablet (100 mg total) by mouth daily.   SODIUM CHLORIDE  (OCEAN) 0.65 % SOLN NASAL SPRAY    PLACE 1 SPRAY INTO BOTH NOSTRILS AS NEEDED FOR CONGESTION.   SUMATRIPTAN  (IMITREX ) 25 MG TABLET    Take 1 tablet (25 mg total) by mouth every 2 (two) hours as needed for migraine. May repeat in 2 hours if headache persists or recurs.   SYMBICORT  160-4.5 MCG/ACT INHALER    Inhale 2 puffs into the lungs in the morning and at bedtime.   TEZEPELUMAB -EKKO (TEZSPIRE ) 210 MG/1. SOAJ    Inject 210 mg into the skin every 28 (twenty-eight) days.   TRAMADOL  (ULTRAM ) 50 MG TABLET    Take 1 tablet (50 mg total) by mouth every 6 (six) hours as needed.   TRIAMCINOLONE  (NASACORT ) 55 MCG/ACT AERO NASAL INHALER    Place 1 spray into the nose 2 (two) times daily. 1 spray each nostril 2 times per day   VENTOLIN  HFA 108 (90 BASE) MCG/ACT INHALER    INHALE TWO PUFFS INTO THE LUNGS EVERY 4 (FOUR) HOURS AS NEEDED FOR WHEEZING OR SHORTNESS OF BREATH.  Modified Medications   No medications on file  Discontinued Medications   No medications on file    Allergies: Allergies  Allergen Reactions   Acetaminophen  Other (See Comments)    Inflamed liver, hospitalized  Other Reaction(s): liver demage   Morphine  Sulfate Hives and Shortness Of Breath   Triamterene Hives   Aspirin -Caffeine Diarrhea and Other (See Comments)     liver damage; upset stomach  Other Reaction(s): liver demage   Dyazide [Hydrochlorothiazide -Triamterene] Hives   Empagliflozin Diarrhea    (Jardiance) stomach ache   Metformin  Hcl Er Other (See Comments) and Nausea Only    upset stomach   Topamax  [Topiramate ] Other (See Comments)    Vision disturbances.   Citalopram  Itching, Rash and Other (See Comments)  Emtricitabine -Tenofovir  Df Rash    Descovy    Lisinopril  Other (See Comments)    Cough    Losartan  Nausea Only and Rash    Pt had rash, worsening dizziness, and nausea after starting losartan , which improved after stopping losartan    Triamterene-Hctz Rash    Labs: Lab Results  Component Value Date   HIV1RNAQUANT 21 (H) 08/24/2023   HIV1RNAQUANT Not Detected 04/25/2023   HIV1RNAQUANT Not Detected 03/06/2023   CD4TABS 518 08/24/2023   CD4TABS 516 03/06/2023   CD4TABS 487 02/08/2022    RPR and STI Lab Results  Component Value Date   LABRPR NON-REACTIVE 02/02/2021   LABRPR NON-REACTIVE 11/13/2019   LABRPR NON-REACTIVE 08/30/2018   LABRPR NON-REACTIVE 11/16/2017   LABRPR NON REAC 07/12/2016    STI Results GC CT  10/20/2022 10:51 AM Negative  Negative   02/02/2021 10:13 AM Negative  Negative   08/30/2018 12:00 AM Negative  Negative   01/11/2018 12:00 AM Negative  Negative   11/16/2017 12:00 AM Negative  Negative   03/25/2011  4:35 PM  NEGATIVE     Hepatitis B Lab Results  Component Value Date   HEPBSAB NEG 12/15/2011   HEPBSAG NEGATIVE 12/15/2011   Hepatitis C Lab Results  Component Value Date   HCVRNAPCRQN <15 NOT DETECTED 02/21/2017   Hepatitis A No results found for: HAV Lipids: Lab Results  Component Value Date   CHOL 153 08/14/2023   TRIG 206 (H) 08/14/2023   HDL 42 08/14/2023   CHOLHDL 3.6 08/14/2023   VLDL 44 (H) 07/12/2016   LDLCALC 77 08/14/2023    TARGET DATE: The 6th  Assessment: Abbygayle presents today for her maintenance Cabenuva  injections. Past injections were tolerated well  without issues. Last HIV RNA was 21 in May. Recovering from shoulder surgery back in July. Agrees to receive the annual flu vaccine today.  Administered cabotegravir  600mg /28mL in left upper outer quadrant of the gluteal muscle. Administered rilpivirine  900 mg/3mL in the right upper outer quadrant of the gluteal muscle. No issues with injections. She will follow up in 2 months for next set of injections.  Plan: - Cabenuva  injections administered - Administered 2025-2026 influenza vaccine  - Next injections scheduled for 03/04/24 with Corean and 04/29/24 with me - Call with any issues or questions  Crystie Yanko L. Harlem Thresher, PharmD, BCIDP, AAHIVP, CPP Clinical Pharmacist Practitioner - Infectious Diseases Clinical Pharmacist Lead - Specialty Pharmacy [facility]

## 2023-12-21 NOTE — Telephone Encounter (Signed)
 Pt called to get appointment scheduled per Dr. Addie. She is unable to go to the [facility] location to see Leroy Trim and was unable to be seen any time today. I made her an appointment for tomorrow 12/22/23 at 10:30 with Dr. Addie

## 2023-12-22 ENCOUNTER — Other Ambulatory Visit: Payer: Self-pay

## 2023-12-22 ENCOUNTER — Other Ambulatory Visit (INDEPENDENT_AMBULATORY_CARE_PROVIDER_SITE_OTHER): Payer: Self-pay

## 2023-12-22 ENCOUNTER — Ambulatory Visit (INDEPENDENT_AMBULATORY_CARE_PROVIDER_SITE_OTHER): Admitting: Orthopedic Surgery

## 2023-12-22 DIAGNOSIS — M79642 Pain in left hand: Secondary | ICD-10-CM | POA: Diagnosis not present

## 2023-12-22 NOTE — Progress Notes (Signed)
 Specialty Pharmacy Refill Coordination Note  [name] [name] is a 63 y.o. female contacted today regarding refills of specialty medication(s) Tezepelumab -ekko (TEZSPIRE )   Patient requested Courier to Provider Office   Delivery date: 12/26/23   Verified address: [facility] [place]  [address]   Medication will be filled on 08.29.25  Appointment 12/28/23.

## 2023-12-23 ENCOUNTER — Encounter: Payer: Self-pay | Admitting: Orthopedic Surgery
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
## 2023-12-26 ENCOUNTER — Ambulatory Visit

## 2023-12-26 ENCOUNTER — Other Ambulatory Visit: Payer: Self-pay

## 2023-12-26 ENCOUNTER — Ambulatory Visit: Admitting: Pharmacist

## 2023-12-26 DIAGNOSIS — B20 Human immunodeficiency virus [HIV] disease: Secondary | ICD-10-CM | POA: Diagnosis present

## 2023-12-26 DIAGNOSIS — Z23 Encounter for immunization: Secondary | ICD-10-CM

## 2023-12-26 MED ORDER — CABOTEGRAVIR & RILPIVIRINE ER 600 & 900 MG/3ML IM SUER
1.0000 | Freq: Once | INTRAMUSCULAR | Status: AC
  Administered 2023-12-26: 1 via INTRAMUSCULAR

## 2023-12-27 ENCOUNTER — Ambulatory Visit: Attending: Orthopedic Surgery | Admitting: Physical Therapy

## 2023-12-27 ENCOUNTER — Encounter: Payer: Self-pay | Admitting: Physical Therapy

## 2023-12-27 DIAGNOSIS — M6281 Muscle weakness (generalized): Secondary | ICD-10-CM | POA: Insufficient documentation

## 2023-12-27 DIAGNOSIS — M25612 Stiffness of left shoulder, not elsewhere classified: Secondary | ICD-10-CM | POA: Insufficient documentation

## 2023-12-27 DIAGNOSIS — M25512 Pain in left shoulder: Secondary | ICD-10-CM | POA: Diagnosis present

## 2023-12-27 DIAGNOSIS — Z96612 Presence of left artificial shoulder joint: Secondary | ICD-10-CM | POA: Insufficient documentation

## 2023-12-27 DIAGNOSIS — G8929 Other chronic pain: Secondary | ICD-10-CM | POA: Insufficient documentation
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
## 2023-12-28 ENCOUNTER — Ambulatory Visit (INDEPENDENT_AMBULATORY_CARE_PROVIDER_SITE_OTHER)

## 2023-12-28 DIAGNOSIS — J4550 Severe persistent asthma, uncomplicated: Secondary | ICD-10-CM
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
## 2023-12-29 ENCOUNTER — Ambulatory Visit

## 2023-12-29 DIAGNOSIS — G8929 Other chronic pain: Secondary | ICD-10-CM

## 2023-12-29 DIAGNOSIS — M25612 Stiffness of left shoulder, not elsewhere classified: Secondary | ICD-10-CM

## 2023-12-29 DIAGNOSIS — Z96612 Presence of left artificial shoulder joint: Secondary | ICD-10-CM | POA: Diagnosis not present

## 2023-12-29 DIAGNOSIS — M6281 Muscle weakness (generalized): Secondary | ICD-10-CM

## 2024-01-01 ENCOUNTER — Ambulatory Visit (INDEPENDENT_AMBULATORY_CARE_PROVIDER_SITE_OTHER): Admitting: Surgical

## 2024-01-01 ENCOUNTER — Encounter: Payer: Self-pay | Admitting: Surgical

## 2024-01-01 DIAGNOSIS — M5412 Radiculopathy, cervical region: Secondary | ICD-10-CM

## 2024-01-01 MED ORDER — IBUPROFEN 800 MG PO TABS: 800.0000 mg | ORAL_TABLET | Freq: Three times a day (TID) | ORAL | 1 refills | Status: AC | PRN
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
## 2024-01-02 ENCOUNTER — Ambulatory Visit

## 2024-01-02 DIAGNOSIS — Z96612 Presence of left artificial shoulder joint: Secondary | ICD-10-CM | POA: Diagnosis not present

## 2024-01-02 DIAGNOSIS — M6281 Muscle weakness (generalized): Secondary | ICD-10-CM

## 2024-01-02 DIAGNOSIS — M25612 Stiffness of left shoulder, not elsewhere classified: Secondary | ICD-10-CM
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
## 2024-01-03 ENCOUNTER — Ambulatory Visit

## 2024-01-03 ENCOUNTER — Telehealth: Payer: Self-pay

## 2024-01-03 DIAGNOSIS — M6281 Muscle weakness (generalized): Secondary | ICD-10-CM

## 2024-01-03 DIAGNOSIS — Z96612 Presence of left artificial shoulder joint: Secondary | ICD-10-CM | POA: Diagnosis not present

## 2024-01-03 DIAGNOSIS — G8929 Other chronic pain: Secondary | ICD-10-CM

## 2024-01-03 DIAGNOSIS — M25612 Stiffness of left shoulder, not elsewhere classified: Secondary | ICD-10-CM

## 2024-01-03 DIAGNOSIS — F411 Generalized anxiety disorder: Secondary | ICD-10-CM

## 2024-01-03 MED ORDER — DIAZEPAM 5 MG PO TABS: ORAL_TABLET | ORAL | 0 refills | Status: AC

## 2024-01-03 NOTE — Telephone Encounter (Signed)
 Patient requesting premedication for procedure

## 2024-01-04 ENCOUNTER — Telehealth: Payer: Self-pay | Admitting: *Deleted

## 2024-01-04 NOTE — Progress Notes (Unsigned)
 Complex Care Management Care Guide Note  01/04/2024 Name: [name] [name] MRN: [id number] DOB: 31-Mar-1961  [name] [name] is a 63 y.o. year old female who is a primary care patient of Elnora Ip, MD and is actively engaged with the care management team. I reached out to [name] [name] by phone today to assist with re-scheduling  with the RN Case Manager.  Follow up plan: Unsuccessful telephone outreach attempt made. A HIPAA compliant phone message was left for the patient providing contact information and requesting a return call.  Thedford Franks, CMA [place]  [facility], [facility] Guide Direct Dial: [phone]  Fax: [phone] Website: [email or web address]

## 2024-01-05 NOTE — Progress Notes (Signed)
 Complex Care Management Care Guide Note  01/05/2024 Name: [name] [name] MRN: [id number] DOB: [birth year]  [name] [name] is a 63 y.o. year old female who is a primary care patient of Elnora Ip, MD and is actively engaged with the care management team. I reached out to [name] [name] by phone today to assist with re-scheduling  with the RN Case Manager.  Follow up plan: Unsuccessful telephone outreach attempt made. A HIPAA compliant phone message was left for the patient providing contact information and requesting a return call. No further outreach attempts will be made due to inability to maintain patient contact.   Thedford Franks, CMA [place]  [facility], [facility] Guide Direct Dial: [phone]  Fax: [phone] Website: [email or web address]

## 2024-01-08 ENCOUNTER — Encounter: Admitting: Physical Therapy

## 2024-01-08 NOTE — Progress Notes (Signed)
 Complex Care Management Care Guide Note  01/08/2024 Name: [name] [name] MRN: [id number] DOB: Jul 25, 1960  [name] [name] is a 63 y.o. year old female who is a primary care patient of Elnora Ip, MD and is actively engaged with the care management team. I reached out to [name] [name] by phone today to assist with re-scheduling  with the RN Case Manager.  Follow up plan: Telephone appointment with complex care management team member scheduled for:  01/18/2024  Thedford Franks, CMA, Care Guide [facility], [facility] Guide Direct Dial: [phone]  Fax: [phone] Website: [email or web address]

## 2024-01-09 ENCOUNTER — Encounter: Attending: Physical Medicine & Rehabilitation | Admitting: Physical Medicine & Rehabilitation

## 2024-01-09 ENCOUNTER — Encounter: Payer: Self-pay | Admitting: Physical Medicine & Rehabilitation

## 2024-01-09 DIAGNOSIS — G8929 Other chronic pain: Secondary | ICD-10-CM | POA: Diagnosis not present

## 2024-01-09 DIAGNOSIS — M533 Sacrococcygeal disorders, not elsewhere classified: Secondary | ICD-10-CM | POA: Insufficient documentation

## 2024-01-09 MED ORDER — LIDOCAINE HCL 1 % IJ SOLN
10.0000 mL | Freq: Once | INTRAMUSCULAR | Status: AC
  Administered 2024-01-09: 10 mL

## 2024-01-09 MED ORDER — BETAMETHASONE SOD PHOS & ACET 6 (3-3) MG/ML IJ SUSP
12.0000 mg | Freq: Once | INTRAMUSCULAR | Status: AC
  Administered 2024-01-09: 12 mg via INTRAMUSCULAR

## 2024-01-09 MED ORDER — IOHEXOL 180 MG/ML  SOLN
3.0000 mL | Freq: Once | INTRAMUSCULAR | Status: AC
  Administered 2024-01-09: 3 mL via INTRAVENOUS

## 2024-01-09 MED ORDER — LIDOCAINE HCL (PF) 2 % IJ SOLN
2.0000 mL | Freq: Once | INTRAMUSCULAR | Status: AC
  Administered 2024-01-09: 2 mL

## 2024-01-09 NOTE — Progress Notes (Signed)
  PROCEDURE RECORD [facility]   Name: [name] [name] DOB:1960-09-18 MRN: [id number]  Date:01/09/2024  Physician: Prentice Compton, MD    Nurse/CMA: [name], CMA  Allergies:  Allergies  Allergen Reactions   Acetaminophen  Other (See Comments)    Inflamed liver, hospitalized  Other Reaction(s): liver demage   Morphine  Sulfate Hives and Shortness Of Breath   Triamterene Hives   Aspirin -Caffeine Diarrhea and Other (See Comments)    liver damage; upset stomach  Other Reaction(s): liver demage   Dyazide [Hydrochlorothiazide -Triamterene] Hives   Empagliflozin Diarrhea    (Jardiance) stomach ache   Metformin  Hcl Er Other (See Comments) and Nausea Only    upset stomach   Topamax  [Topiramate ] Other (See Comments)    Vision disturbances.   Citalopram  Itching, Rash and Other (See Comments)   Emtricitabine -Tenofovir  Df Rash    Descovy    Lisinopril  Other (See Comments)    Cough    Losartan  Nausea Only and Rash    Pt had rash, worsening dizziness, and nausea after starting losartan , which improved after stopping losartan    Triamterene-Hctz Rash    Consent Signed: Yes.    Is patient diabetic? Yes.    CBG today? 146  Pregnant: No. LMP: No LMP recorded. Patient is postmenopausal. (age 39-55)  Anticoagulants: no Anti-inflammatory: yes (Ibuprofen , none taken today) Antibiotics: no  Procedure: Bilateral Sacroiliac Joint Injection  Position: Prone Start Time: 11:39 am  End Time: 11:45 am  Fluoro Time: 24  RN/CMA [name], CMA [name], CMA    Time 11:13 am 11:53 am    BP 135/85 129/84    Pulse 82 89    Respirations 16 16    O2 Sat 96 98    S/S 6 6    Pain Level 10/10 6/10     D/C home with [name] driver, [name] & O X 3, D/C instructions reviewed, and sits independently.

## 2024-01-09 NOTE — Addendum Note (Signed)
 Addended by: [name] FLEMING T on: 01/09/2024 02:55 PM   Modules accepted: Orders

## 2024-01-09 NOTE — Progress Notes (Signed)
 Bilateral sacroiliac injections under fluoroscopic guidance  Indication: Low back and buttocks pain not relieved by medication management and other conservative care.Has had failure of PT and OTC meds.  Hx HIV, Last CD4 May  2025 518 Last injection 10/03/23   Informed consent was obtained after describing risks and benefits of the procedure with the patient, this includes bleeding, bruising, infection, paralysis and medication side effects. The patient wishes to proceed and has given written consent. The patient was placed in a prone position. The lumbar and sacral area was marked and prepped with Betadine . A 25-gauge 1-1/2 inch needle was inserted into the skin and subcutaneous tissue and 1 mL of 1% lidocaine  was injected into each side. Then a 25-gauge 3 inch spinal needle was inserted under fluoroscopic guidance into the left sacroiliac joint. AP and lateral images were utilized. Omnipaque  180x0.5 mL under live fluoroscopy demonstrated no intravascular uptake. Then a solution containing one ML of 6 mg per mL Celestone  in 2 ML of 2% lidocaine  MPF was injected x1.5 mL. This same procedure was repeated on the right side using the same needle, injectate, and technique. Patient tolerated the procedure well. Post procedure instructions were given. Please see post procedure form.

## 2024-01-09 NOTE — Patient Instructions (Signed)
Sacroiliac injection was performed today. A combination of numbing medicine (lidocaine) plus a cortisone medicine (betamethasone) was injected. The injection was done under x-ray guidance. This procedure has been performed to help reduce low back and buttocks pain as well as potentially hip pain. The duration of this injection is variable lasting from hours to  Months. It may repeated if needed. 

## 2024-01-10 ENCOUNTER — Ambulatory Visit

## 2024-01-10 ENCOUNTER — Other Ambulatory Visit: Payer: Self-pay

## 2024-01-10 ENCOUNTER — Other Ambulatory Visit: Payer: Self-pay | Admitting: Infectious Diseases

## 2024-01-10 DIAGNOSIS — M6281 Muscle weakness (generalized): Secondary | ICD-10-CM

## 2024-01-10 DIAGNOSIS — G8929 Other chronic pain: Secondary | ICD-10-CM

## 2024-01-10 DIAGNOSIS — Z96612 Presence of left artificial shoulder joint: Secondary | ICD-10-CM

## 2024-01-10 MED ORDER — VEOZAH 45 MG PO TABS
1.0000 | ORAL_TABLET | Freq: Every day | ORAL | 4 refills | Status: DC
  Filled 2024-01-10: qty 30, 30d supply, fill #0

## 2024-01-10 MED ORDER — VEOZAH 45 MG PO TABS: 1.0000 | ORAL_TABLET | Freq: Every day | ORAL | 4 refills | Status: DC

## 2024-01-10 NOTE — Telephone Encounter (Signed)
 Do you want to continue if so [employer]
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
## 2024-01-10 NOTE — Addendum Note (Signed)
 Addended by: MELVENIA COREAN SAILOR on: 01/10/2024 11:46 AM   Modules accepted: Orders

## 2024-01-12 ENCOUNTER — Other Ambulatory Visit: Payer: Self-pay

## 2024-01-12 NOTE — Progress Notes (Signed)
 Specialty Pharmacy Refill Coordination Note  [name] [name] is a 63 y.o. female contacted today regarding refills of specialty medication(s) Tezepelumab -ekko (TEZSPIRE )   Patient requested Courier to Provider Office   Delivery date: 01/18/24   Verified address: [facility] [place]  [address]   Medication will be filled on 01/17/24.

## 2024-01-16 ENCOUNTER — Encounter: Payer: Self-pay | Admitting: Physician Assistant

## 2024-01-16 ENCOUNTER — Ambulatory Visit: Admitting: Physician Assistant

## 2024-01-16 ENCOUNTER — Other Ambulatory Visit: Payer: Self-pay | Admitting: Allergy and Immunology

## 2024-01-16 VITALS — BP 160/93 | HR 80

## 2024-01-16 DIAGNOSIS — L853 Xerosis cutis: Secondary | ICD-10-CM | POA: Diagnosis not present

## 2024-01-16 DIAGNOSIS — J3089 Other allergic rhinitis: Secondary | ICD-10-CM

## 2024-01-16 DIAGNOSIS — D492 Neoplasm of unspecified behavior of bone, soft tissue, and skin: Secondary | ICD-10-CM

## 2024-01-16 DIAGNOSIS — D489 Neoplasm of uncertain behavior, unspecified: Secondary | ICD-10-CM

## 2024-01-16 DIAGNOSIS — L299 Pruritus, unspecified: Secondary | ICD-10-CM

## 2024-01-16 NOTE — Progress Notes (Signed)
   New Patient Visit   Subjective  [name] [name] is a 63 y.o. female NEW PATIENT who presents for the following: growth on right forearm and dark skin on her elbow.   Patient states she has growth and dark elbows located at the right arm and both elbows that she would like to have examined. Patient reports the areas have been there for 10 years.  She states that the areas have not spread. Patient reports she has not previously been treated for these areas.    The following portions of the chart were reviewed this encounter and updated as appropriate: medications, allergies, medical history  Review of Systems:  No other skin or systemic complaints except as noted in HPI or Assessment and Plan.  Objective  Well appearing patient in no apparent distress; mood and affect are within normal limits.  A focused examination was performed of the following areas: right arm and both elbows  Relevant exam findings are noted in the Assessment and Plan.             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  I, Doyce Pan, CMA, am acting as scribe for Chany Woolworth K, PA-C.   Documentation: I have reviewed the above documentation for accuracy and completeness, and I agree with the above.  Haylie Mccutcheon K, PA-C

## 2024-01-16 NOTE — Patient Instructions (Signed)

## 2024-01-17 ENCOUNTER — Other Ambulatory Visit: Payer: Self-pay

## 2024-01-17 ENCOUNTER — Ambulatory Visit

## 2024-01-17 DIAGNOSIS — G8929 Other chronic pain: Secondary | ICD-10-CM

## 2024-01-17 DIAGNOSIS — Z96612 Presence of left artificial shoulder joint: Secondary | ICD-10-CM | POA: Diagnosis not present

## 2024-01-17 DIAGNOSIS — M6281 Muscle weakness (generalized): Secondary | ICD-10-CM
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
## 2024-01-18 ENCOUNTER — Telehealth: Payer: Self-pay | Admitting: *Deleted

## 2024-01-18 ENCOUNTER — Other Ambulatory Visit: Payer: Self-pay | Admitting: *Deleted
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
## 2024-01-18 NOTE — Patient Outreach (Signed)
 Complex Care Management   Visit Note  01/18/2024  Name:  [name] [name] MRN: [id number] DOB: October 05, 1960  Situation: Referral received for Complex Care Management related to HTN. I obtained verbal consent from Patient.  Visit completed with Patient  on the phone  Background:   Past Medical History:  Diagnosis Date   Addison's disease (HCC)    Allergic rhinitis 05/09/2006   Allergy     Anxiety    Arthritis    Asthma    Blood dyscrasia    HIV   CAD (coronary artery disease) 03/19/2023   CCTA 03/16/23: CAC score 471 (98th percentile), RCA diffuse 25-49, dLM minimal Ca2+ plaque, LAD prox minimal plaques, pLCx < 24; TPV 542 mm3 (86th percentile)    CHF (congestive heart failure) (HCC)    Chronic back pain    Chronic night sweats 08/23/2022   Depression    Diabetes mellitus without complication (HCC) 04/25/2014   GERD (gastroesophageal reflux disease)    Heart murmur    as a child   Hepatitis C    genotype 1b, 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 s/p 12 week course of simeprevir and sofosbuvir between October 2014 and January 2015 with resolution.   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   Urticaria    Vaginal atrophy 10/20/2022    Assessment: Patient Reported Symptoms:  Cognitive Cognitive Status: Able to follow simple commands, Normal speech and language skills, Alert and oriented to person, place, and time      Neurological Neurological Review of Symptoms: Dizziness (Medication related) Neurological Management Strategies: Medication therapy, Routine screening  HEENT HEENT Symptoms Reported: Eye  dryness (Uses OTC and medication prescribed. Reports ongoing allergies) HEENT Management Strategies: Routine screening, Coping strategies    Cardiovascular Cardiovascular Symptoms Reported: No symptoms reported Does patient have uncontrolled Hypertension?: Yes Is patient checking Blood Pressure at home?: Yes Patient's Recent BP reading at home: 128/70 this morning Cardiovascular Management Strategies: Routine screening, Medication therapy, Coping strategies Cardiovascular Self-Management Outcome: 4 (good)  Respiratory Respiratory Symptoms Reported: No symptoms reported Other Respiratory Symptoms: history of congestion (clear) and uses inhaler and Mucinex  as needed Respiratory Management Strategies: Routine screening  Endocrine Endocrine Symptoms Reported: No symptoms reported Is patient diabetic?: Yes Is patient checking blood sugars at home?: Yes List most recent blood sugar readings, include date and time of day: Patient reports she receives steroid oral and injections 01/09/2024 that sometimes affects her glucose levels. Reports an AM read of 154    Gastrointestinal Gastrointestinal Symptoms Reported: No symptoms reported      Genitourinary Genitourinary Symptoms Reported: No symptoms reported    Integumentary Integumentary Symptoms Reported: No symptoms reported Skin Management Strategies: Routine screening, Medication therapy  Musculoskeletal Musculoskelatal Symptoms Reviewed: Back pain, Other (off and on from a reported pinched nerve in the shoulder) Musculoskeletal Management Strategies: Coping strategies, Medication therapy, Routine screening Falls in the past year?: No Number of falls in past year: 1 or less Was there an injury with Fall?: No Fall Risk Category Calculator: 0 Patient Fall Risk Level: Low Fall  Risk Patient at Risk for Falls Due to: No Fall Risks Fall risk Follow up: Falls prevention discussed  Psychosocial Psychosocial Symptoms Reported: No symptoms reported           01/18/2024    PHQ2-9 Depression Screening   Little interest or pleasure in doing things Not at all  Feeling down, depressed, or hopeless Not at all  PHQ-2 - Total Score 0  Trouble falling or staying asleep, or sleeping too much    Feeling tired or having little energy    Poor appetite or overeating     Feeling bad about yourself - or that you are a failure or have let yourself or your family down    Trouble concentrating on things, such as reading the newspaper or watching television    Moving or speaking so slowly that other people could have noticed.  Or the opposite - being so fidgety or restless that you have been moving around a lot more than usual    Thoughts that you would be better off dead, or hurting yourself in some way    PHQ2-9 Total Score    If you checked off any problems, how difficult have these problems made it for you to do your work, take care of things at home, or get along with other people    Depression Interventions/Treatment      Vitals:   01/18/24 1434  BP: 128/70    Medications Reviewed Today     Reviewed by Alvia Olam BIRCH, RN (Registered Nurse) on 01/18/24 at 1448  Med List Status: <None>   Medication Order Taking? Sig Documenting Provider Last Dose Status Informant  ACCU-CHEK GUIDE test strip [id number] Yes use to check blood sugar In Vitro Once a day DX: E11.9 for 90 days [provider]  Active Self  amLODipine  (NORVASC ) 10 MG tablet [id number] Yes Take 1 tablet (10 mg total) by mouth daily. Gomez-Caraballo, Maria, MD  Active Self  aspirin  EC 81 MG tablet [id number] Yes Take 1 tablet (81 mg total) by mouth daily. Swallow whole. Addie Cordella Hamilton, MD  Active   atorvastatin  (LIPITOR) 10 MG tablet [id number] Yes Take 0.5 tablets (5 mg total) by mouth daily. Gomez-Caraballo, Maria, MD  Active Self  betamethasone  valerate ointment (VALISONE ) 0.1 % [id number] Yes Apply 1 Application topically 2 (two) times daily. Melvenia Corean SAILOR, NP   Active Self  cabotegravir  & rilpivirine  ER (CABENUVA ) 600 & 900 MG/3ML injection [id number] Yes Inject 1 kit into the muscle every 2 (two) months. Waddell Alan PARAS, RPH-CPP  Active   cetirizine  (ZYRTEC ) 10 MG tablet [id number] Yes Take 2 tablets (20 mg total) by mouth 2 (two) times daily. Kozlow, Eric J, MD  Active Self  chlorthalidone  (HYGROTON ) 25 MG tablet [id number] Yes Take 1 tablet (25 mg total) by mouth daily. Tobie Gaines, DO  Active Self  cholecalciferol  (VITAMIN D3) 25 MCG (1000 UNIT) tablet [id number] Yes Take 3 tablets (3,000 Units total) by mouth daily. Nooruddin, Saad, MD  Active   ciprofloxacin -dexamethasone  (CIPRODEX ) OTIC suspension [id number] Yes Place 4 drops into the left ear as needed (ear infection). [provider]  Active Self  diazepam  (VALIUM ) 5 MG tablet [id number] Yes Take 1 by mouth 1 hour  pre-procedure with very light food. Do not drive motor vehicle. Eldonna Novel, MD  Active   docusate sodium  (COLACE) 100 MG capsule [id number] Yes Take 1 capsule (100 mg total) by mouth 2 (two) times daily. Addie Cordella Hamilton, MD  Active   ezetimibe  (ZETIA )  10 MG tablet [id number] Yes Take 1 tablet (10 mg total) by mouth daily. Gomez-Caraballo, Maria, MD  Active Self  famotidine  (PEPCID ) 40 MG tablet [id number] Yes Take 1 tablet (40 mg total) by mouth at bedtime. Gomez-Caraballo, Maria, MD  Active Self  Fezolinetant  (VEOZAH ) 45 MG TABS [id number] Yes Take 1 tablet (45 mg total) by mouth daily. Melvenia Corean SAILOR, NP  Active   gabapentin  (NEURONTIN ) 400 MG capsule [id number] Yes Take 1 capsule (400 mg total) by mouth at bedtime. Kozlow, Camellia PARAS, MD  Active Self  GLOBAL EASE INJECT PEN NEEDLES 32G X 4 MM MISC [id number] Yes USE AS DIRECTED WITH VICTOZA for 30 [provider]  Active Self  glucose blood test strip [id number] Yes Use as instructed Tobie Gaines, DO  Active Self  hydrocortisone  (CORTEF ) 5 MG tablet [id number] Yes Take 2.5-5 mg by mouth See admin instructions. Take 1 tablet  (5 mg) by mouth in the morning (scheduled) & may taken an additional 0.5 tablet (2.5 mg) by mouth in the evening if needed. [provider]  Active Self           Med Note STEPHEN OLIVIA Kitchens Feb 27, 2023  3:23 PM)    ibuprofen  (ADVIL ) 800 MG tablet [id number] Yes Take 1 tablet (800 mg total) by mouth every 8 (eight) hours as needed. Magnant, Carlin CROME, PA-C  Active   liraglutide (VICTOZA) 18 MG/3ML SOPN [id number] Yes Inject 1.8 mg into the skin every evening. (1700) [provider]  Active Self  methocarbamol  (ROBAXIN ) 500 MG tablet [id number] Yes Take 1 tablet (500 mg total) by mouth every 6 (six) hours as needed for muscle spasms. Magnant, Carlin CROME, PA-C  Active   metoprolol  tartrate (LOPRESSOR ) 50 MG tablet [id number] Yes Take 1 tablet (50 mg total) by mouth 2 (two) times daily. Gomez-Caraballo, Maria, MD  Active Self  montelukast  (SINGULAIR ) 10 MG tablet [id number] Yes Take 1 tablet (10 mg total) by mouth at bedtime. Kozlow, Eric J, MD  Active Self  NEURONTIN  100 MG capsule [id number] Yes TAKE TWO CAPSULES (200 MG TOTAL) BY MOUTH TWO (TWO) TIMES DAILY. TAKE 1-2 CAPSULES EVERY 3-4 TIMES DAILY. Kozlow, Camellia PARAS, MD  Active   nitroGLYCERIN  (NITROSTAT ) 0.4 MG SL tablet [id number] Yes Place 1 tablet (0.4 mg total) under the tongue every 5 (five) minutes as needed for chest pain. Lou Claretta HERO, MD  Active Self  nortriptyline  (PAMELOR ) 10 MG capsule [id number] Yes TAKE TWO CAPSULES BY MOUTH AT BEDTIME Onita Duos, MD  Active Self  ondansetron  (ZOFRAN ) 4 MG tablet [id number] Yes Take 1 tablet (4 mg total) by mouth every 8 (eight) hours as needed for nausea or vomiting. Stephanie Freund, MD  Active Self  pantoprazole  (PROTONIX ) 40 MG tablet [id number] Yes Take 1 tablet (40 mg total) by mouth 2 (two) times daily. Gomez-Caraballo, Maria, MD  Active Self  potassium chloride  SA (KLOR-CON  M) 20 MEQ tablet [id number] Yes Take 2 tablets (40 mEq total) by mouth daily. Lelon Hamilton T, PA-C  Active Self   psyllium (HYDROCIL/METAMUCIL) 95 % PACK [id number] Yes Take 1 packet by mouth daily. [provider]  Active Self  sertraline  (ZOLOFT ) 100 MG tablet [id number] Yes Take 1 tablet (100 mg total) by mouth daily. Gomez-Caraballo, Maria, MD  Active Self  sodium chloride  (OCEAN) 0.65 % SOLN nasal spray [id number] Yes PLACE 1 SPRAY INTO BOTH NOSTRILS AS NEEDED FOR CONGESTION. Rosan Harlene Fickle, DO  Active Self  SUMAtriptan  (IMITREX ) 25 MG tablet [id number] Yes Take  1 tablet (25 mg total) by mouth every 2 (two) hours as needed for migraine. May repeat in 2 hours if headache persists or recurs. Onita Duos, MD  Active Self  SYMBICORT  160-4.5 MCG/ACT inhaler [id number] Yes Inhale 2 puffs into the lungs in the morning and at bedtime. Kozlow, Eric J, MD  Active Self  Tezepelumab -ekko (TEZSPIRE ) 210 MG/1. SOAJ [id number] Yes Inject 210 mg into the skin every 28 (twenty-eight) days. Kozlow, Camellia PARAS, MD  Active Self  tezepelumab -ekko (TEZSPIRE ) 210 MG/1. syringe 210 mg [id number]   Kozlow, Eric J, MD  Active   traMADol  (ULTRAM ) 50 MG tablet [id number] Yes Take 1 tablet (50 mg total) by mouth every 6 (six) hours as needed. Magnant, Carlin CROME, PA-C  Active   triamcinolone  (NASACORT ) 55 MCG/ACT AERO nasal inhaler [id number] Yes Place 1 spray into the nose 2 (two) times daily. 1 spray each nostril 2 times per day Maurilio Camellia PARAS, MD  Active Self  VENTOLIN  HFA 108 (90 Base) MCG/ACT inhaler [id number] Yes INHALE TWO PUFFS INTO THE LUNGS EVERY 4 (FOUR) HOURS AS NEEDED FOR WHEEZING OR SHORTNESS OF BREATH. Kozlow, Camellia PARAS, MD  Active Self            Recommendation:   PCP Follow-up Continue Current Plan of Care  Follow Up Plan:   Telephone follow up appointment date/time:  02/13/2024 @10 :00 AM  Olam Ku, RN, BSN [place]  [facility], [facility] RN Care Manager Direct Dial: [phone]  Fax: [phone]

## 2024-01-18 NOTE — Patient Instructions (Signed)
 Visit Information  Ms. Blattner was given information about Medicaid Managed Care team care coordination services as a part of their [employer] Medicaid benefit.   If you would like to schedule transportation through your [facility], please call the following number at least 2 days in advance of your appointment: [phone]   Rides for urgent appointments can also be made after hours by calling Member Services.  Call the Behavioral Health Crisis Line at [phone], at any time, 24 hours a day, 7 days a week. If you are in danger or need immediate medical attention call 911.  Please see education materials related to HTN provided by MyChart link.  Patient verbalizes understanding of instructions and care plan provided today and agrees to view in MyChart. Active MyChart status and patient understanding of how to access instructions and care plan via MyChart confirmed with patient.     Telephone follow up appointment with Managed Medicaid care management team member scheduled for: 02/13/2024 @ 10:00 AM   Olam Ku, RN, BSN [place]  [facility], [facility] RN Care Manager Direct Dial: [phone]  Fax: [phone]

## 2024-01-19 ENCOUNTER — Ambulatory Visit

## 2024-01-19 DIAGNOSIS — G8929 Other chronic pain: Secondary | ICD-10-CM

## 2024-01-19 DIAGNOSIS — M6281 Muscle weakness (generalized): Secondary | ICD-10-CM

## 2024-01-19 DIAGNOSIS — Z96612 Presence of left artificial shoulder joint: Secondary | ICD-10-CM

## 2024-01-19 DIAGNOSIS — M25612 Stiffness of left shoulder, not elsewhere classified: Secondary | ICD-10-CM

## 2024-01-23 ENCOUNTER — Ambulatory Visit

## 2024-01-23 DIAGNOSIS — M6281 Muscle weakness (generalized): Secondary | ICD-10-CM

## 2024-01-23 DIAGNOSIS — Z96612 Presence of left artificial shoulder joint: Secondary | ICD-10-CM
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
## 2024-01-25 ENCOUNTER — Ambulatory Visit (INDEPENDENT_AMBULATORY_CARE_PROVIDER_SITE_OTHER)

## 2024-01-25 DIAGNOSIS — J4550 Severe persistent asthma, uncomplicated: Secondary | ICD-10-CM

## 2024-01-26 ENCOUNTER — Telehealth: Payer: Self-pay | Admitting: Physical Therapy

## 2024-01-26 ENCOUNTER — Ambulatory Visit: Attending: Orthopedic Surgery | Admitting: Physical Therapy

## 2024-01-26 DIAGNOSIS — Z96612 Presence of left artificial shoulder joint: Secondary | ICD-10-CM | POA: Insufficient documentation

## 2024-01-26 DIAGNOSIS — G8929 Other chronic pain: Secondary | ICD-10-CM | POA: Insufficient documentation

## 2024-01-26 DIAGNOSIS — M25612 Stiffness of left shoulder, not elsewhere classified: Secondary | ICD-10-CM | POA: Insufficient documentation

## 2024-01-26 DIAGNOSIS — M25512 Pain in left shoulder: Secondary | ICD-10-CM | POA: Insufficient documentation

## 2024-01-26 DIAGNOSIS — M6281 Muscle weakness (generalized): Secondary | ICD-10-CM | POA: Insufficient documentation

## 2024-01-26 NOTE — Telephone Encounter (Signed)
 [name] [name] called to let therapist know that she mixed up her appointment times and missed her appointment today. She confirmed her next appointment date/time.

## 2024-01-29 ENCOUNTER — Ambulatory Visit: Admitting: Physical Medicine and Rehabilitation

## 2024-01-29 ENCOUNTER — Other Ambulatory Visit: Payer: Self-pay

## 2024-01-29 VITALS — BP 121/77 | HR 76

## 2024-01-29 DIAGNOSIS — M5412 Radiculopathy, cervical region: Secondary | ICD-10-CM | POA: Diagnosis not present

## 2024-01-29 MED ORDER — METHYLPREDNISOLONE ACETATE 80 MG/ML IJ SUSP
40.0000 mg | Freq: Once | INTRAMUSCULAR | Status: AC
  Administered 2024-01-29: 40 mg

## 2024-01-29 NOTE — Procedures (Signed)
 Cervical Epidural Steroid Injection - Interlaminar Approach with Fluoroscopic Guidance  Patient: [name] [name]      Date of Birth: Sep 04, 1960 MRN: [id number] PCP: Elnora Ip, MD      Visit Date: 01/29/2024   Universal Protocol:    Date/Time: 10/06/259:44 AM  Consent Given By: the patient  Position: PRONE  Additional Comments: Vital signs were monitored before and after the procedure. Patient was prepped and draped in the usual sterile fashion. The correct patient, procedure, and site was verified.   Injection Procedure Details:   Procedure diagnoses: Cervical radiculopathy [M54.12]    Meds Administered:  Meds ordered this encounter  Medications   methylPREDNISolone  acetate (DEPO-MEDROL ) injection 40 mg     Laterality: Left  Location/Site: C7-T1  Needle: 3.5 in., 20 ga. Tuohy  Needle Placement: Paramedian epidural space  Findings:  -Comments: Excellent flow of contrast into the epidural space.  Procedure Details: Using a paramedian approach from the side mentioned above, the region overlying the inferior lamina was localized under fluoroscopic visualization and the soft tissues overlying this structure were infiltrated with 4 ml. of 1% Lidocaine  without Epinephrine . A # 20 gauge, Tuohy needle was inserted into the epidural space using a paramedian approach.  The epidural space was localized using loss of resistance along with contralateral oblique bi-planar fluoroscopic views.  After negative aspirate for air, blood, and CSF, a 2 ml. volume of Isovue -250 was injected into the epidural space and the flow of contrast was observed. Radiographs were obtained for documentation purposes.   The injectate was administered into the level noted above.  Additional Comments:  The patient tolerated the procedure well Dressing: 2 x 2 sterile gauze and Band-Aid    Post-procedure details: Patient was observed during the procedure. Post-procedure instructions were  reviewed.  Patient left the clinic in stable condition.

## 2024-01-29 NOTE — Progress Notes (Signed)
 [name] [name] - 63 y.o. female MRN [id number]  Date of birth: 1960/08/28  Office Visit Note: Visit Date: 01/29/2024 PCP: Elnora Ip, MD Referred by: Shirly Carlin LITTIE, PA-C  Subjective: Chief Complaint  Patient presents with   Neck - Pain   HPI:  [name] [name] is a 63 y.o. female who comes in today for planned repeat Left C7-T1  Cervical Interlaminar epidural steroid injection with fluoroscopic guidance.  The patient has failed conservative care including home exercise, medications, time and activity modification.  This injection will be diagnostic and hopefully therapeutic.  Please see requesting physician notes for further details and justification. Patient received more than 50% pain relief from prior injection.   Referring: Herlene Shirly, PA-C   ROS Otherwise per HPI.  Assessment & Plan: Visit Diagnoses:    ICD-10-CM   1. Cervical radiculopathy  M54.12 XR C-ARM NO REPORT    Epidural Steroid injection    methylPREDNISolone  acetate (DEPO-MEDROL ) injection 40 mg      Plan: No additional findings.   Meds & Orders:  Meds ordered this encounter  Medications   methylPREDNISolone  acetate (DEPO-MEDROL ) injection 40 mg    Orders Placed This Encounter  Procedures   XR C-ARM NO REPORT   Epidural Steroid injection    Follow-up: Return for visit to requesting provider as needed.   Procedures: No procedures performed  Cervical Epidural Steroid Injection - Interlaminar Approach with Fluoroscopic Guidance  Patient: [name] [name]      Date of Birth: 1960/06/11 MRN: [id number] PCP: Elnora Ip, MD      Visit Date: 01/29/2024   Universal Protocol:    Date/Time: 10/06/259:44 AM  Consent Given By: the patient  Position: PRONE  Additional Comments: Vital signs were monitored before and after the procedure. Patient was prepped and draped in the usual sterile fashion. The correct patient, procedure, and site was verified.   Injection Procedure  Details:   Procedure diagnoses: Cervical radiculopathy [M54.12]    Meds Administered:  Meds ordered this encounter  Medications   methylPREDNISolone  acetate (DEPO-MEDROL ) injection 40 mg     Laterality: Left  Location/Site: C7-T1  Needle: 3.5 in., 20 ga. Tuohy  Needle Placement: Paramedian epidural space  Findings:  -Comments: Excellent flow of contrast into the epidural space.  Procedure Details: Using a paramedian approach from the side mentioned above, the region overlying the inferior lamina was localized under fluoroscopic visualization and the soft tissues overlying this structure were infiltrated with 4 ml. of 1% Lidocaine  without Epinephrine . A # 20 gauge, Tuohy needle was inserted into the epidural space using a paramedian approach.  The epidural space was localized using loss of resistance along with contralateral oblique bi-planar fluoroscopic views.  After negative aspirate for air, blood, and CSF, a 2 ml. volume of Isovue -250 was injected into the epidural space and the flow of contrast was observed. Radiographs were obtained for documentation purposes.   The injectate was administered into the level noted above.  Additional Comments:  The patient tolerated the procedure well Dressing: 2 x 2 sterile gauze and Band-Aid    Post-procedure details: Patient was observed during the procedure. Post-procedure instructions were reviewed.  Patient left the clinic in stable condition.   Clinical History: MRI CERVICAL SPINE WITHOUT CONTRAST   TECHNIQUE: Multiplanar, multisequence MR imaging of the cervical spine was performed. No intravenous contrast was administered.   COMPARISON:  None Available.   FINDINGS: Alignment: No static listhesis. Loss of the normal cervical lordosis with straightening.   Vertebrae:  No acute fracture, evidence of discitis, or aggressive bone lesion.   Cord: Normal signal and morphology.   Posterior Fossa, vertebral arteries,  paraspinal tissues: Posterior fossa demonstrates no focal abnormality. Vertebral artery flow voids are maintained. Paraspinal soft tissues are unremarkable.   Disc levels:   Discs: Mild degenerative disease with disc height loss at C4-5.   C2-3: No significant disc bulge. No neural foraminal stenosis. No central canal stenosis.   C3-4: Tiny central disc protrusion. No foraminal or central canal stenosis.   C4-5: Mild broad-based disc bulge. Bilateral uncovertebral degenerative changes. Mild bilateral foraminal stenosis. No central canal stenosis.   C5-6: No significant disc bulge. No neural foraminal stenosis. No central canal stenosis.   C6-7: Broad-based disc bulge with a central disc protrusion contacting the ventral cervical spinal cord. No foraminal or central canal stenosis.   C7-T1: Broad-based disc bulge eccentric towards the right. Moderate right and mild left foraminal stenosis. No spinal stenosis.   IMPRESSION: 1. At C4-5 there is a mild broad-based disc bulge. Bilateral uncovertebral degenerative changes. Mild bilateral foraminal stenosis. 2. At C6-7 there is a broad-based disc bulge with a central disc protrusion contacting the ventral cervical spinal cord. 3. At C7-T1 there is a broad-based disc bulge eccentric towards the right. Moderate right and mild left foraminal stenosis. 4. No acute osseous injury of the cervical spine.     Electronically Signed   By: Julaine Blanch M.D.   On: 07/28/2022 08:59     Objective:  VS:  HT:    WT:   BMI:     BP:121/77  HR:76bpm  TEMP: ( )  RESP:  Physical Exam Vitals and nursing note reviewed.  Constitutional:      General: She is not in acute distress.    Appearance: Normal appearance. She is not ill-appearing.  HENT:     Head: Normocephalic and atraumatic.     Right Ear: External ear normal.     Left Ear: External ear normal.  Eyes:     Extraocular Movements: Extraocular movements intact.  Cardiovascular:      Rate and Rhythm: Normal rate.     Pulses: Normal pulses.  Musculoskeletal:     Cervical back: Tenderness present. No rigidity.     Right lower leg: No edema.     Left lower leg: No edema.     Comments: Patient has good strength in the upper extremities including 5 out of 5 strength in wrist extension long finger flexion and APB.  There is no atrophy of the hands intrinsically.  There is a negative Hoffmann's test.   Lymphadenopathy:     Cervical: No cervical adenopathy.  Skin:    Findings: No erythema, lesion or rash.  Neurological:     General: No focal deficit present.     Mental Status: She is alert and oriented to person, place, and time.     Sensory: No sensory deficit.     Motor: No weakness or abnormal muscle tone.     Coordination: Coordination normal.  Psychiatric:        Mood and Affect: Mood normal.        Behavior: Behavior normal.      Imaging: No results found.

## 2024-01-29 NOTE — Progress Notes (Signed)
 Pain Scale   Average Pain 10 Patient advising she has chronic neck pain radiating bilaterally to shoulders.         +Driver, -BT, -Dye Allergies.

## 2024-01-30 ENCOUNTER — Ambulatory Visit

## 2024-01-30 DIAGNOSIS — M6281 Muscle weakness (generalized): Secondary | ICD-10-CM

## 2024-01-30 DIAGNOSIS — G8929 Other chronic pain: Secondary | ICD-10-CM

## 2024-01-30 DIAGNOSIS — Z96612 Presence of left artificial shoulder joint: Secondary | ICD-10-CM | POA: Diagnosis present

## 2024-01-30 DIAGNOSIS — M25612 Stiffness of left shoulder, not elsewhere classified: Secondary | ICD-10-CM

## 2024-01-30 DIAGNOSIS — M25512 Pain in left shoulder: Secondary | ICD-10-CM | POA: Diagnosis present
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
## 2024-02-02 ENCOUNTER — Ambulatory Visit

## 2024-02-02 DIAGNOSIS — Z96612 Presence of left artificial shoulder joint: Secondary | ICD-10-CM

## 2024-02-02 DIAGNOSIS — M25612 Stiffness of left shoulder, not elsewhere classified: Secondary | ICD-10-CM

## 2024-02-02 DIAGNOSIS — M6281 Muscle weakness (generalized): Secondary | ICD-10-CM

## 2024-02-02 DIAGNOSIS — G8929 Other chronic pain: Secondary | ICD-10-CM
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
## 2024-02-05 DIAGNOSIS — D352 Benign neoplasm of pituitary gland: Secondary | ICD-10-CM | POA: Diagnosis not present

## 2024-02-05 DIAGNOSIS — I10 Essential (primary) hypertension: Secondary | ICD-10-CM | POA: Diagnosis not present

## 2024-02-05 DIAGNOSIS — M810 Age-related osteoporosis without current pathological fracture: Secondary | ICD-10-CM | POA: Diagnosis not present

## 2024-02-05 DIAGNOSIS — E2749 Other adrenocortical insufficiency: Secondary | ICD-10-CM | POA: Diagnosis not present

## 2024-02-05 DIAGNOSIS — E1140 Type 2 diabetes mellitus with diabetic neuropathy, unspecified: Secondary | ICD-10-CM | POA: Diagnosis not present

## 2024-02-06 ENCOUNTER — Ambulatory Visit

## 2024-02-06 DIAGNOSIS — M6281 Muscle weakness (generalized): Secondary | ICD-10-CM

## 2024-02-06 DIAGNOSIS — Z96612 Presence of left artificial shoulder joint: Secondary | ICD-10-CM | POA: Diagnosis not present

## 2024-02-06 DIAGNOSIS — G8929 Other chronic pain: Secondary | ICD-10-CM
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
## 2024-02-07 ENCOUNTER — Other Ambulatory Visit (HOSPITAL_COMMUNITY): Payer: Self-pay

## 2024-02-08 ENCOUNTER — Ambulatory Visit

## 2024-02-08 DIAGNOSIS — G8929 Other chronic pain: Secondary | ICD-10-CM

## 2024-02-08 DIAGNOSIS — Z96612 Presence of left artificial shoulder joint: Secondary | ICD-10-CM | POA: Diagnosis not present

## 2024-02-08 DIAGNOSIS — M6281 Muscle weakness (generalized): Secondary | ICD-10-CM
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
## 2024-02-09 ENCOUNTER — Other Ambulatory Visit: Payer: Self-pay

## 2024-02-09 ENCOUNTER — Encounter: Payer: Self-pay | Admitting: Student

## 2024-02-09 ENCOUNTER — Ambulatory Visit

## 2024-02-09 VITALS — BP 131/72 | HR 80 | Temp 98.2°F | Ht 61.0 in | Wt 167.0 lb

## 2024-02-09 DIAGNOSIS — K7400 Hepatic fibrosis, unspecified: Secondary | ICD-10-CM

## 2024-02-09 DIAGNOSIS — Z1231 Encounter for screening mammogram for malignant neoplasm of breast: Secondary | ICD-10-CM

## 2024-02-09 DIAGNOSIS — R110 Nausea: Secondary | ICD-10-CM

## 2024-02-09 DIAGNOSIS — E876 Hypokalemia: Secondary | ICD-10-CM

## 2024-02-09 DIAGNOSIS — M810 Age-related osteoporosis without current pathological fracture: Secondary | ICD-10-CM

## 2024-02-09 DIAGNOSIS — E119 Type 2 diabetes mellitus without complications: Secondary | ICD-10-CM

## 2024-02-09 DIAGNOSIS — E559 Vitamin D deficiency, unspecified: Secondary | ICD-10-CM | POA: Diagnosis not present

## 2024-02-09 DIAGNOSIS — G4700 Insomnia, unspecified: Secondary | ICD-10-CM

## 2024-02-09 LAB — POCT GLYCOSYLATED HEMOGLOBIN (HGB A1C): HbA1c, POC (controlled diabetic range): 6.6 % (ref 0.0–7.0)

## 2024-02-09 LAB — GLUCOSE, CAPILLARY: Glucose-Capillary: 146 mg/dL — ABNORMAL HIGH (ref 70–99)

## 2024-02-09 MED ORDER — ONDANSETRON HCL 4 MG PO TABS: 4.0000 mg | ORAL_TABLET | Freq: Three times a day (TID) | ORAL | 0 refills | Status: AC | PRN

## 2024-02-09 NOTE — Progress Notes (Unsigned)
 CC: Routine Follow Up for management of chronic medical conditions after last office visit 11/08/2023  HPI:  [name] [name] is a 63 y.o. 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 Please see assessment and plan below for further details.  Medications: Current Outpatient Medications  Medication Instructions   ACCU-CHEK GUIDE test strip use to check blood sugar In Vitro Once a day DX: E11.9 for 90 days   amLODipine  (NORVASC ) 10 mg, Oral, Daily   aspirin  EC 81 mg, Oral, Daily, Swallow whole.   atorvastatin  (LIPITOR) 5 mg, Oral, Daily   betamethasone  valerate ointment (VALISONE ) 0.1 % 1 Application, Topical, 2 times daily   cabotegravir  & rilpivirine  ER (CABENUVA ) 600 & 900 MG/3ML injection 1 kit, Intramuscular, Every 2 months   cetirizine  (ZYRTEC ) 20 mg, Oral, 2 times daily   chlorthalidone  (HYGROTON ) 25 mg, Oral, Daily   cholecalciferol  (VITAMIN D3) 3,000 Units, Oral, Daily   ciprofloxacin -dexamethasone  (CIPRODEX ) OTIC suspension 4 drops, As needed   diazepam  (VALIUM ) 5 MG tablet Take 1 by mouth 1 hour  pre-procedure with very light food. Do not drive motor vehicle.   docusate sodium  (COLACE) 100 mg, Oral, 2 times daily   ezetimibe  (ZETIA ) 10 mg, Oral, Daily   famotidine  (PEPCID ) 40 mg, Oral, Nightly   gabapentin  (NEURONTIN ) 400 mg, Oral, Nightly   GLOBAL EASE INJECT PEN NEEDLES 32G X 4 MM MISC USE AS DIRECTED WITH VICTOZA for 30   glucose blood test strip Use as instructed   hydrocortisone  (CORTEF ) 2.5-5 mg, See admin instructions   ibuprofen  (ADVIL ) 800 mg, Oral, Every 8 hours PRN   liraglutide (VICTOZA) 1.8 mg, Every evening   methocarbamol  (ROBAXIN ) 500 mg, Oral, Every 6 hours PRN   metoprolol  tartrate (LOPRESSOR ) 50 mg,  Oral, 2 times daily   montelukast  (SINGULAIR ) 10 mg, Oral, Nightly   NEURONTIN  100 MG capsule TAKE TWO CAPSULES (200 MG TOTAL) BY MOUTH TWO (TWO) TIMES DAILY. TAKE 1-2 CAPSULES EVERY 3-4 TIMES DAILY.   nitroGLYCERIN  (NITROSTAT ) 0.4 mg, Sublingual, Every 5 min PRN   nortriptyline  (PAMELOR ) 20 mg, Oral, Daily at bedtime   ondansetron  (ZOFRAN ) 4 mg, Oral, Every 8 hours PRN   pantoprazole  (PROTONIX ) 40 mg, Oral, 2 times daily   potassium chloride  SA (KLOR-CON  M) 20 MEQ tablet 40 mEq, Oral, Daily   psyllium (HYDROCIL/METAMUCIL) 95 % PACK 1 packet, Daily   sertraline  (ZOLOFT ) 100 mg, Oral, Daily   sodium chloride  (OCEAN) 0.65 % SOLN nasal spray 1 spray, Each Nare, As needed   SUMAtriptan  (IMITREX ) 25 mg, Oral, Every 2 hours PRN, May repeat in 2 hours if headache persists or recurs.   SYMBICORT  160-4.5 MCG/ACT inhaler 2 puffs, Inhalation, 2 times daily   [place]  210 mg, Subcutaneous, Every 28 days   traMADol  (ULTRAM ) 50 mg, Oral, Every 6 hours PRN   triamcinolone  (NASACORT ) 55 MCG/ACT AERO nasal inhaler 1 spray, Nasal, 2 times daily, 1 spray each nostril 2 times per day   VENTOLIN  HFA 108 (90 Base) MCG/ACT inhaler INHALE TWO PUFFS INTO THE LUNGS EVERY 4 (FOUR) HOURS AS NEEDED FOR WHEEZING OR SHORTNESS OF BREATH.   [place]  45 mg, Oral, Daily     Review of Systems:   Pertinent items noted in HPI and/or A&P.  Physical Exam:  Vitals:   02/09/24 1017  BP: 131/72  Pulse: 80  Temp: 98.2 F (36.8 C)  TempSrc: Oral  SpO2: 94%  Weight: 167 lb (75.8 kg)  Height: 5' 1 (1.549 m)    Const: Awake, alert in NAD HENT: Normocephalic, atraumatic, mucus membranes moist Card: RRR, No MRG, No pitting edema on LE's bilaterally  Resp: LCTAB, no increased work of breathing Abd: Soft, NTND, Bsx4 Extremities: Warm, pink    Assessment & Plan:   Assessment & Plan Vitamin D  deficiency The patient is on 3000IU's of vitamin D  daily. Will recheck today to ensure adequate supplementation, especially with  her history of significant osteoporosis.  Hypokalemia The patient does have a history of hypokalemia and is on supplementation at 40mEq per day prescribed by her cardiologist. She is on chlorthalidone  25mg  daily for hypertensive management. Will check BMP today and if electrolytes remain abnormal, will consider adjustment of chlorthalidone  to an ARB. Discussed this with the patient and she is hesitant to adjust her antihypertensives as her blood pressure is well managed, however she is frequently symptomatic from hypokalemia including frequent muscle cramps. She is in agreement to adjust medications if necessary.  Osteoporosis, unspecified osteoporosis type, unspecified pathological fracture presence The patient has a long history of osteoporosis and has recently had to have a shoulder replacement as a result. She follows with Dr. Faythe for management due to chronic steroid use. Will order DEXA scan and vit D today as the patient is on chronic supplementation. Insomnia, unspecified type The patient reported continued difficulty with sleep, both initiation and remaining asleep. Her sleep hygiene is adequate with the patient reporting no screens and  going to bed and waking up around the same time each night. She has attempted to take melatonin, but it has never helped her. She has never tried any other sleep aids. She describes the sensation of waking up multiple times per night and not feeling well rested after sleep. She is on chronic steroids, which she much take later in the day which I suspect are contributing. With her body habitus and description of symptoms, suspect she may have OSA. She has never had a polysomnography. Discussed this with the patient and will refer to sleep medicine for further evaluation before initiating pharmaceutical sleep aids.   Orders Placed This Encounter  Procedures   Glucose, capillary   POC Hbg A1C     No follow-ups on file.   Patient seen with Dr. MICAEL Riis  Winfrey

## 2024-02-09 NOTE — Patient Instructions (Signed)
 Thank you, Ms.[name], for allowing us  to provide your care today. Today we discussed . . .  > High blood pressure       - We have checked your labs today--we will call you with the results on Monday or Tuesday.        - We may change one of your BP medications depending on your electrolytes > Osteoporosis       - We have referred you for a bone density scan and checked your vitamin D  today.  > Sleep       - We have referred you to sleep medicine for further evaluation and a possible sleep study.    I have ordered the following labs for you:   Lab Orders         Glucose, capillary         POC Hbg A1C       Referrals ordered today:   Referral Orders  No referral(s) requested today      Follow up: 3 months    Remember:  Should you have any questions or concerns please call the internal medicine clinic at [phone].     Schuyler Novak, DO [facility]

## 2024-02-09 NOTE — Progress Notes (Unsigned)
 HTN (hypertension)    Vitals:    11/08/23 0952  BP: 128/66      Pt presents for follow up regarding blood pressure. At her last visit, her hydrochlorothiazide  was switched to chlorthalidone , which she states she has been taking and hasn't had any trouble with. Her other anti-hypertensive regimen is amlodipine  10mg  and metoprolol  50mg  BID. BP well controlled this morning, will not make any changes.    Plan:  - Pt had BMP done earlier this morning as part of her pre-op clearance, will follow   Vitamin D  deficiency Refilled her vitamin D  3000U daily. She does have osteoporosis diagnosed. Last checked one month ago was 37, low normal.     CC: Routine Follow Up for management of chronic medical conditions after last office visit 11/08/2023  HPI:  [name] [name] is a 63 y.o. 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 Please see assessment and plan below for further details.  Medications: Current Outpatient Medications  Medication Instructions   ACCU-CHEK GUIDE test strip use to check blood sugar In Vitro Once a day DX: E11.9 for 90 days   amLODipine  (NORVASC ) 10 mg, Oral, Daily   aspirin  EC 81 mg, Oral, Daily, Swallow whole.   atorvastatin  (LIPITOR) 5 mg, Oral, Daily   betamethasone  valerate ointment (VALISONE ) 0.1 % 1 Application, Topical, 2 times daily   cabotegravir  & rilpivirine  ER (CABENUVA ) 600 & 900 MG/3ML injection 1 kit, Intramuscular, Every 2 months   cetirizine  (ZYRTEC ) 20 mg, Oral, 2 times daily   chlorthalidone  (HYGROTON ) 25 mg, Oral, Daily   cholecalciferol  (VITAMIN D3) 3,000 Units, Oral, Daily   ciprofloxacin -dexamethasone  (CIPRODEX ) OTIC suspension 4 drops, As needed   diazepam  (VALIUM ) 5 MG tablet Take 1 by  mouth 1 hour  pre-procedure with very light food. Do not drive motor vehicle.   docusate sodium  (COLACE) 100 mg, Oral, 2 times daily   ezetimibe  (ZETIA ) 10 mg, Oral, Daily   famotidine  (PEPCID ) 40 mg, Oral, Nightly   gabapentin  (NEURONTIN ) 400 mg, Oral, Nightly   GLOBAL EASE INJECT PEN NEEDLES 32G X 4 MM MISC USE AS DIRECTED WITH VICTOZA for 30   glucose blood test strip Use as instructed   hydrocortisone  (CORTEF ) 2.5-5 mg, See admin instructions   ibuprofen  (ADVIL ) 800 mg, Oral, Every 8 hours PRN   liraglutide (VICTOZA) 1.8 mg, Every evening   methocarbamol  (ROBAXIN ) 500 mg, Oral, Every 6 hours PRN   metoprolol  tartrate (LOPRESSOR ) 50 mg, Oral, 2 times daily   montelukast  (SINGULAIR ) 10 mg, Oral, Nightly   NEURONTIN  100 MG capsule TAKE TWO CAPSULES (200 MG TOTAL) BY MOUTH TWO (TWO) TIMES DAILY. TAKE 1-2 CAPSULES EVERY 3-4 TIMES DAILY.   nitroGLYCERIN  (NITROSTAT ) 0.4 mg, Sublingual, Every 5 min PRN   nortriptyline  (PAMELOR ) 20 mg, Oral, Daily at bedtime   ondansetron  (ZOFRAN ) 4 mg, Oral, Every 8 hours PRN   pantoprazole  (PROTONIX ) 40 mg, Oral, 2 times daily   potassium chloride  SA (KLOR-CON  M) 20 MEQ tablet 40 mEq, Oral, Daily   psyllium (HYDROCIL/METAMUCIL) 95 % PACK 1 packet, Daily   sertraline  (ZOLOFT ) 100 mg, Oral, Daily   sodium chloride  (OCEAN) 0.65 % SOLN nasal spray 1 spray, Each Nare, As needed   SUMAtriptan  (IMITREX ) 25 mg, Oral, Every 2 hours PRN, May repeat in 2 hours  if headache persists or recurs.   SYMBICORT  160-4.5 MCG/ACT inhaler 2 puffs, Inhalation, 2 times daily   [place]  210 mg, Subcutaneous, Every 28 days   traMADol  (ULTRAM ) 50 mg, Oral, Every 6 hours PRN   triamcinolone  (NASACORT ) 55 MCG/ACT AERO nasal inhaler 1 spray, Nasal, 2 times daily, 1 spray each nostril 2 times per day   VENTOLIN  HFA 108 (90 Base) MCG/ACT inhaler INHALE TWO PUFFS INTO THE LUNGS EVERY 4 (FOUR) HOURS AS NEEDED FOR WHEEZING OR SHORTNESS OF BREATH.   [place]  45 mg, Oral, Daily     Review of  Systems:   Pertinent items noted in HPI and/or A&P.  Physical Exam:  There were no vitals filed for this visit.  Constitutional:***. In no acute distress. HEENT: Normocephalic, atraumatic, Sclera non-icteric, PERRL, EOM intact Cardio:Regular rate and rhythm. 2+ bilateral {PulseLoc:28294} pulses. Pulm:Clear to auscultation bilaterally. Normal work of breathing on room air. Abdomen: Soft, non-tender, non-distended, positive bowel sounds. FDX:Wzhjupcz for extremity edema. Skin:Warm and dry. Neuro:Alert and oriented x3. No focal deficit noted. Psych:Pleasant mood and affect.   Assessment & Plan:   Assessment & Plan Liver fibrosis (HCC)  Breast cancer screening by mammogram   No orders of the defined types were placed in this encounter.    No follow-ups on file.   Patient {GC/GE:[id number]::discussed with,seen with} {JGIMTSattending2025/2026:32954}  Fairy Pool, DO [facility] Internal [facility] Resident PGY-3 Clinic Phone: [phone] Please contact the on call pager at [phone] for any urgent or emergent needs.

## 2024-02-10 LAB — BASIC METABOLIC PANEL WITH GFR
BUN/Creatinine Ratio: 18 (ref 12–28)
BUN: 22 mg/dL (ref 8–27)
CO2: 24 mmol/L (ref 20–29)
Calcium: 9.7 mg/dL (ref 8.7–10.3)
Chloride: 100 mmol/L (ref 96–106)
Creatinine, Ser: 1.19 mg/dL — ABNORMAL HIGH (ref 0.57–1.00)
Glucose: 124 mg/dL — ABNORMAL HIGH (ref 70–99)
Potassium: 3.2 mmol/L — ABNORMAL LOW (ref 3.5–5.2)
Sodium: 141 mmol/L (ref 134–144)
eGFR: 52 mL/min/1.73 — ABNORMAL LOW (ref >59)

## 2024-02-10 LAB — VITAMIN D 25 HYDROXY (VIT D DEFICIENCY, FRACTURES): Vit D, 25-Hydroxy: 52.4 ng/mL (ref 30.0–100.0)

## 2024-02-12 ENCOUNTER — Encounter: Payer: Self-pay | Admitting: Surgical

## 2024-02-12 ENCOUNTER — Other Ambulatory Visit (INDEPENDENT_AMBULATORY_CARE_PROVIDER_SITE_OTHER): Payer: Self-pay

## 2024-02-12 ENCOUNTER — Ambulatory Visit: Admitting: Surgical

## 2024-02-12 DIAGNOSIS — Z96612 Presence of left artificial shoulder joint: Secondary | ICD-10-CM

## 2024-02-12 DIAGNOSIS — M25532 Pain in left wrist: Secondary | ICD-10-CM

## 2024-02-12 NOTE — Assessment & Plan Note (Signed)
 The patient is on 3000IU's of vitamin D  daily. Will recheck today to ensure adequate supplementation, especially with her history of significant osteoporosis.

## 2024-02-12 NOTE — Assessment & Plan Note (Signed)
 The patient reported continued difficulty with sleep, both initiation and remaining asleep. Her sleep hygiene is adequate with the patient reporting no screens and  going to bed and waking up around the same time each night. She has attempted to take melatonin, but it has never helped her. She has never tried any other sleep aids. She describes the sensation of waking up multiple times per night and not feeling well rested after sleep. She is on chronic steroids, which she much take later in the day which I suspect are contributing. With her body habitus and description of symptoms, suspect she may have OSA. She has never had a polysomnography. Discussed this with the patient and will refer to sleep medicine for further evaluation before initiating pharmaceutical sleep aids.
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
## 2024-02-12 NOTE — Assessment & Plan Note (Signed)
 The patient has a long history of osteoporosis and has recently had to have a shoulder replacement as a result. She follows with Dr. Faythe for management due to chronic steroid use. Will order DEXA scan and vit D today as the patient is on chronic supplementation.

## 2024-02-13 ENCOUNTER — Ambulatory Visit: Admitting: Physical Therapy

## 2024-02-13 ENCOUNTER — Encounter: Payer: Self-pay | Admitting: Physical Therapy

## 2024-02-13 ENCOUNTER — Other Ambulatory Visit: Payer: Self-pay | Admitting: *Deleted

## 2024-02-13 DIAGNOSIS — Z96612 Presence of left artificial shoulder joint: Secondary | ICD-10-CM

## 2024-02-13 DIAGNOSIS — M6281 Muscle weakness (generalized): Secondary | ICD-10-CM

## 2024-02-13 NOTE — Patient Instructions (Signed)
 Visit Information  Ms. Petrosino was given information about Medicaid Managed Care team care coordination services as a part of their [facility] Community Plan Medicaid benefit.   If you would like to schedule transportation through your [facility], please call the following number at least 2 days in advance of your appointment: [phone]   Rides for urgent appointments can also be made after hours by calling Member Services.  Call the Behavioral Health Crisis Line at [phone], at any time, 24 hours a day, 7 days a week. If you are in danger or need immediate medical attention call 911.  Please see education materials related to Hemoglobin A1C Test provided by MyChart link.  Patient verbalizes understanding of instructions and care plan provided today and agrees to view in MyChart. Active MyChart status and patient understanding of how to access instructions and care plan via MyChart confirmed with patient.     Telephone follow up appointment with Managed Medicaid care management team member scheduled for:  03/15/2024 @ 10:30 AM    Olam Ku, RN, BSN [place]  [facility], [facility] Health RN Care Manager Direct Dial: [phone]  Fax: [phone]

## 2024-02-13 NOTE — Progress Notes (Signed)
 Internal Medicine Clinic Attending  I was physically present during the key portions of the resident provided service and participated in the medical decision making of patient's management care. I reviewed pertinent patient test results.  The assessment, diagnosis, and plan were formulated together and I agree with the documentation in the resident's note.  Francesco Elsie NOVAK, MD

## 2024-02-13 NOTE — Patient Outreach (Addendum)
 Complex Care Management   Visit Note  02/13/2024  Name:  [name] [name] MRN: [id number] DOB: [birth year]  Situation: Referral received for Complex Care Management related to HTN I obtained verbal consent from Patient.  Visit completed with Patient  on the phone  Background:   Past Medical History:  Diagnosis Date   Addison's disease (HCC)    Allergic rhinitis 05/09/2006   Allergy     Anxiety    Arthritis    Asthma    Blood dyscrasia    HIV   CAD (coronary artery disease) 03/19/2023   CCTA 03/16/23: CAC score 471 (98th percentile), RCA diffuse 25-49, dLM minimal Ca2+ plaque, LAD prox minimal plaques, pLCx < 24; TPV 542 mm3 (86th percentile)    CHF (congestive heart failure) (HCC)    Chronic back pain    Chronic night sweats 08/23/2022   Depression    Diabetes mellitus without complication (HCC) 04/25/2014   GERD (gastroesophageal reflux disease)    Heart murmur    as a child   Hepatitis C    genotype 1b, 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 s/p 12 week course of simeprevir and sofosbuvir between October 2014 and January 2015 with resolution.   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 Will provide education attached information to pt's MyChart today. Patient Reported Symptoms:  Cognitive Cognitive Status: Able to follow simple commands, Alert and oriented to person, place, and time, Normal speech and language skills Cognitive/Intellectual Conditions  Management [RPT]: None reported or documented in medical history or problem list   Health Maintenance Behaviors: Annual physical exam  Neurological Neurological Review of Symptoms: Headaches Neurological Management Strategies: Routine screening, Medication therapy  HEENT HEENT Symptoms Reported: No symptoms reported HEENT Management Strategies: Routine screening HEENT Self-Management Outcome: 5 (very good) Ear problem(s) (hx of ear problems)  Cardiovascular Cardiovascular Symptoms Reported: Other: (Reports some arrhythmias with activities-MD aware and pt pending an appointment with her CAD provider. Verified pt is aware to seek immediately care if symptoms are not ealier resolved with rest or worsen.) Other Cardiovascular Symptoms: Pending test and lab results to address with her cardiologist for follow up. Does patient have uncontrolled Hypertension?: Yes Is patient checking Blood Pressure at home?: Yes Patient's Recent BP reading at home: 02/09/2024 office visit was 131/72 and ranges from 121/77-143/82 over the last 2 weeks using home device Cardiovascular Management Strategies: Medication therapy, Routine screening, Medical device, Coping strategies Weight: 165 lb (74.8 kg) Cardiovascular Self-Management Outcome: 4 (good)  Respiratory Respiratory Symptoms Reported: Other: (allergies seasonal) Additional Respiratory Details: Uses inhaler as prescribed for seasonal allergies when needed Respiratory Management Strategies: Routine screening, Coping strategies, Adequate rest, Medication therapy  Endocrine Endocrine Symptoms Reported: No symptoms reported Is patient diabetic?: Yes Is patient checking blood sugars at home?: Yes List most recent blood sugar readings, include date and time of day: Fasting CBG 02/09/2024 148. Recent A1C 02/09/2024-6.6% Endocrine Self-Management  Outcome: 4 (good)  Gastrointestinal Gastrointestinal Symptoms Reported: No symptoms reported      Genitourinary  Genitourinary Symptoms Reported: No symptoms reported    Integumentary Integumentary Symptoms Reported: No symptoms reported Skin Management Strategies: Routine screening  Musculoskeletal Musculoskelatal Symptoms Reviewed: Weakness Additional Musculoskeletal Details: Pt reports she is pending a Bone Density test due to fragile bones reported by her provider. Musculoskeletal Management Strategies: Medication therapy, Medical device, Routine screening Musculoskeletal Self-Management Outcome: 4 (good)      Psychosocial Psychosocial Symptoms Reported: No symptoms reported   Major Change/Loss/Stressor/Fears (CP): Denies      There were no vitals filed for this visit.  Medications Reviewed Today     Reviewed by Alvia Olam BIRCH, RN (Registered Nurse) on 02/13/24 at 1014  Med List Status: <None>   Medication Order Taking? Sig Documenting Provider Last Dose Status Informant  ACCU-CHEK GUIDE test strip [id number] Yes use to check blood sugar In Vitro Once a day DX: E11.9 for 90 days [provider]  Active Self  amLODipine  (NORVASC ) 10 MG tablet [id number] Yes Take 1 tablet (10 mg total) by mouth daily. Gomez-Caraballo, Maria, MD  Active Self  aspirin  EC 81 MG tablet [id number] Yes Take 1 tablet (81 mg total) by mouth daily. Swallow whole. Addie Cordella Hamilton, MD  Active   atorvastatin  (LIPITOR) 10 MG tablet [id number] Yes Take 0.5 tablets (5 mg total) by mouth daily. Gomez-Caraballo, Maria, MD  Active Self  betamethasone  valerate ointment (VALISONE ) 0.1 % [id number] Yes Apply 1 Application topically 2 (two) times daily. Melvenia Corean SAILOR, NP  Active Self  cabotegravir  & rilpivirine  ER (CABENUVA ) 600 & 900 MG/3ML injection [id number] Yes Inject 1 kit into the muscle every 2 (two) months. Waddell Alan PARAS, RPH-CPP  Active   cetirizine  (ZYRTEC ) 10 MG tablet [id number] Yes Take 2 tablets (20 mg total) by mouth 2 (two) times daily. Kozlow, Camellia PARAS, MD  Active Self  chlorthalidone  (HYGROTON ) 25 MG  tablet [id number] Yes Take 1 tablet (25 mg total) by mouth daily. Tobie Gaines, DO  Active Self  cholecalciferol  (VITAMIN D3) 25 MCG (1000 UNIT) tablet [id number] Yes Take 3 tablets (3,000 Units total) by mouth daily. Nooruddin, Saad, MD  Active   ciprofloxacin -dexamethasone  (CIPRODEX ) OTIC suspension [id number] Yes Place 4 drops into the left ear as needed (ear infection). [provider]  Active Self  diazepam  (VALIUM ) 5 MG tablet [id number] Yes Take 1 by mouth 1 hour  pre-procedure with very light food. Do not drive motor vehicle. Eldonna Novel, MD  Active   docusate sodium  (COLACE) 100 MG capsule [id number] Yes Take 1 capsule (100 mg total) by mouth 2 (two) times daily. Addie Cordella Hamilton, MD  Active   ezetimibe  (ZETIA ) 10 MG tablet [id number] Yes Take 1 tablet (10 mg total) by mouth daily. Gomez-Caraballo, Maria, MD  Active Self  famotidine  (PEPCID ) 40 MG tablet [id number] Yes Take 1 tablet (40 mg total) by mouth at bedtime. Elnora Ip, MD  Active Self  Fezolinetant  (VEOZAH ) 45 MG TABS [id number] Yes Take 1 tablet (45 mg total) by mouth daily. Melvenia Corean SAILOR, NP  Active   gabapentin  (NEURONTIN ) 400 MG capsule [id number] Yes Take 1 capsule (400 mg total) by mouth at bedtime. Kozlow, Camellia PARAS, MD  Active Self  GLOBAL EASE INJECT PEN NEEDLES 32G X 4 MM MISC [id number] Yes USE AS DIRECTED WITH VICTOZA for 30 [provider]  Active Self  glucose blood test strip [id number] Yes Use as instructed Tobie Gaines, DO  Active Self  hydrocortisone  (CORTEF ) 5 MG tablet [id number] Yes Take 2.5-5 mg by mouth See admin instructions. Take 1 tablet (5 mg) by mouth in the morning (scheduled) & may taken an additional 0.5 tablet (2.5 mg) by mouth in the evening if needed. [provider]  Active Self           Med Note STEPHEN OLIVIA Kitchens Feb 27, 2023  3:23 PM)    ibuprofen  (ADVIL ) 800 MG tablet [id number] Yes Take 1 tablet (800 mg total) by mouth every 8 (eight) hours as needed.  Magnant, Carlin CROME, PA-C  Active   liraglutide (VICTOZA) 18 MG/3ML SOPN [id number] Yes Inject 1.8 mg into the skin every evening. (1700) [provider]  Active Self  methocarbamol  (ROBAXIN ) 500 MG tablet [id number]  Take 1 tablet (500 mg total) by mouth every 6 (six) hours as needed for muscle spasms.  Patient not taking: Reported on 02/13/2024   Shirly Carlin CROME, PA-C  Active   metoprolol  tartrate (LOPRESSOR ) 50 MG tablet [id number] Yes Take 1 tablet (50 mg total) by mouth 2 (two) times daily. Gomez-Caraballo, Maria, MD  Active Self  montelukast  (SINGULAIR ) 10 MG tablet [id number] Yes Take 1 tablet (10 mg total) by mouth at bedtime. Kozlow, Eric J, MD  Active Self  NEURONTIN  100 MG capsule [id number] Yes TAKE TWO CAPSULES (200 MG TOTAL) BY MOUTH TWO (TWO) TIMES DAILY. TAKE 1-2 CAPSULES EVERY 3-4 TIMES DAILY. Kozlow, Camellia PARAS, MD  Active   nitroGLYCERIN  (NITROSTAT ) 0.4 MG SL tablet [id number] Yes Place 1 tablet (0.4 mg total) under the tongue every 5 (five) minutes as needed for chest pain. Lou Claretta HERO, MD  Active Self  nortriptyline  (PAMELOR ) 10 MG capsule [id number] Yes TAKE TWO CAPSULES BY MOUTH AT BEDTIME Onita Duos, MD  Active Self  ondansetron  (ZOFRAN ) 4 MG tablet [id number] Yes Take 1 tablet (4 mg total) by mouth every 8 (eight) hours as needed for nausea or vomiting. Myrna Bitters, DO  Active   pantoprazole  (PROTONIX ) 40 MG tablet [id number] Yes Take 1 tablet (40 mg total) by mouth 2 (two) times daily. Gomez-Caraballo, Maria, MD  Active Self  potassium chloride  SA (KLOR-CON  M) 20 MEQ tablet [id number] Yes Take 2 tablets (40 mEq total) by mouth daily. Lelon Hamilton T, PA-C  Active Self  psyllium (HYDROCIL/METAMUCIL) 95 % PACK [id number] Yes Take 1 packet by mouth daily. [provider]  Active Self  sertraline  (ZOLOFT ) 100 MG tablet [id number] Yes Take 1 tablet (100 mg total) by mouth daily. Gomez-Caraballo, Maria, MD  Active Self  sodium chloride  (OCEAN) 0.65 % SOLN nasal spray  [id number] Yes PLACE 1 SPRAY INTO BOTH NOSTRILS AS NEEDED FOR CONGESTION. Rosan Harlene Fickle, DO  Active Self  SUMAtriptan  (IMITREX ) 25 MG tablet [id number] Yes Take 1 tablet (25 mg total) by mouth every 2 (two) hours as needed for migraine. May repeat in 2 hours if headache persists or recurs. Onita Duos, MD  Active Self  SYMBICORT  160-4.5 MCG/ACT inhaler [id number] Yes Inhale 2 puffs into the lungs in the morning and at bedtime. Kozlow, Eric J, MD  Active Self  Tezepelumab -ekko (TEZSPIRE ) 210 MG/1. SOAJ [id number] Yes Inject 210 mg into the skin every 28 (twenty-eight) days. Kozlow, Camellia PARAS, MD  Active Self  tezepelumab -ekko (TEZSPIRE ) 210 MG/1. syringe 210 mg [id number]   Kozlow, Eric J, MD  Active   traMADol  (ULTRAM ) 50 MG tablet [id number] Yes Take 1 tablet (50 mg total) by mouth every 6 (six) hours as needed. Magnant, Carlin CROME, PA-C  Active   triamcinolone  (NASACORT ) 55 MCG/ACT AERO nasal inhaler [id number] Yes Place 1 spray into the nose 2 (two) times daily. 1 spray each nostril 2 times per day Maurilio Camellia PARAS, MD  Active Self  VENTOLIN  HFA 108 (90 Base) MCG/ACT inhaler [id number] Yes INHALE TWO PUFFS INTO THE LUNGS EVERY 4 (FOUR) HOURS AS NEEDED FOR WHEEZING OR SHORTNESS OF BREATH. Kozlow, Camellia PARAS, MD  Active Self            Recommendation:   PCP Follow-up Continue Current Plan of Care  Follow Up Plan:   Telephone follow up appointment date/time:  03/15/2024 @ 10:30 AM   Olam Ku, RN, BSN [place]  [facility], [facility] RN Care Manager Direct Dial: [phone]  Fax: [phone]
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
## 2024-02-14 ENCOUNTER — Other Ambulatory Visit (HOSPITAL_COMMUNITY): Payer: Self-pay

## 2024-02-15 ENCOUNTER — Other Ambulatory Visit: Payer: Self-pay

## 2024-02-15 ENCOUNTER — Ambulatory Visit

## 2024-02-15 DIAGNOSIS — Z96612 Presence of left artificial shoulder joint: Secondary | ICD-10-CM

## 2024-02-15 DIAGNOSIS — M6281 Muscle weakness (generalized): Secondary | ICD-10-CM
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
## 2024-02-15 NOTE — Progress Notes (Signed)
 Specialty Pharmacy Refill Coordination Note  [name] [name] is a 63 y.o. female assessed today regarding refills of clinic administered specialty medication(s) Tezepelumab -ekko (TEZSPIRE )   Clinic requested Courier to Provider Office   Delivery date: 02/19/24   Injection date: 02/22/24  Verified address: [facility] [place]  [address]   Medication will be filled on 02/16/24.

## 2024-02-16 ENCOUNTER — Other Ambulatory Visit: Payer: Self-pay

## 2024-02-20 ENCOUNTER — Ambulatory Visit

## 2024-02-20 DIAGNOSIS — Z96612 Presence of left artificial shoulder joint: Secondary | ICD-10-CM | POA: Diagnosis not present

## 2024-02-20 DIAGNOSIS — M6281 Muscle weakness (generalized): Secondary | ICD-10-CM
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
## 2024-02-21 NOTE — Patient Instructions (Incomplete)
  1.  Continue to Treat and prevent inflammation:   A.  Montelukast  10 mg - 1 tablet 1 time per day  B.  Symbicort  160 - 2 inhalations 2 times per day  C.  Nasacort  -1 spray each nostril 1-2 times per day  D.  START Tezepelumab  injections monthly - insurance approval required.   2.  Continue to Treat and prevent reflux:   A.  Protonix  40 mg 2 times per day  B.  Famotidine  40 mg in p.m.    3.  Continue to treat and prevent itchiness:    A. cetirizine  10 mg -1-2 tablets 1-2 times a day   B. Gabapentin  200 mg - 200 / 200 / 400  4.  If needed:   A.  OTC Benadryl   B.  OTC nasal saline  C.  Pro Air HFA 2 puffs every 4-6 hours  D.  Nasal saline  5. Return to clinic in 6 months or earlier if problem  6. Influenza = Tamifu. Covid = Paxlovid

## 2024-02-21 NOTE — Progress Notes (Signed)
 [address] [place] [address] Dept: [phone]  FOLLOW UP NOTE  [name] Bolus, female    DOB: [birth year]  Age: 63 y.o. MRN: [id number] Date of Office Visit: 02/22/2024  Assessment  Chief Complaint: Follow-up (Asthma/Allergies/No concerns)  HPI [name] [name] is a 63 year old female who presents to the clinic for follow-up visit.  She was last seen in this clinic on 02/28/2023 by Dr. Kozlow for evaluation of asthma on tezspire  injections, allergic rhinitis, laryngeal pharyngeal reflux, and pruritus.  Her last environmental allergy  skin testing on 08/15/2017 was positive to dust mite mix on intradermal testing.  Discussed the use of AI scribe software for clinical note transcription with the patient, who gave verbal consent to proceed.  History of Present Illness [name] is a 63 year old female who presents for follow-up on her breathing and allergy  symptoms.  Her breathing has improved significantly, although she experiences shortness of breath with exertion, such as house cleaning and mopping. No shortness of breath at rest and seldom experiences wheezing, which typically occurs only when she is ill. She has a history of smoking but quit in 2009. She experiences intermittent coughing, sometimes productive of clear sputum, but not on a daily basis. She uses Symbicort  as needed, approximately a couple of times a month, and montelukast  (Singulair ) nightly.  She continues Tezspire  injections once every 4 weeks with no large or local reactions.  She reports a significant decrease in her symptoms of asthma while continuing on Tezspire  injections.  Allergic rhinitis is reported as  well-controlled with no allergy  symptoms such as stuffy or runny nose, sneezing, and drainage in the back of her throat. She uses Nasacort  and saline nasal rinses she for nasal dryness and allergies, and takes cetirizine  (Zyrtec ) daily.  She reports nasal dryness and uses saline nasal spray. She  has a history of ear perforations and uses a hearing aid.  She continues to experience itching, particularly at night, and takes Neurontin  (gabapentin ) to manage this, with doses of 200 mg in the morning, 200 mg midday, and 400 mg at night. The itching worsens at night and is associated with her insomnia.  She takes steroids for adrenal insufficiency, 5 mg in the morning and 2.5 mg at noon, and has reduced her dosage due to weight gain. Her weight has decreased from 180 lbs to 165 lbs, and she has not experienced an adrenal crisis since reducing the dosage. She is under the care of an endocrinologist.  Reflux is reported as well-controlled with no symptoms at this time including heartburn or vomiting.  She continues Protonix  40 mg twice daily and famotidine  at night for acid reflux, which controls her symptoms effectively.  She underwent shoulder replacement surgery at the end of July and has been in physical therapy twice a week for four months. She previously had surgeries for bone spurs, a torn rotator cuff, and a torn supraspinatus muscle. The shoulder replacement has alleviated her previous pain, although she is still undergoing therapy and expects full recovery to take about a year.  Her current medications are listed in the chart.   Drug Allergies:  Allergies  Allergen Reactions   Acetaminophen  Other (See Comments)    Inflamed liver, hospitalized  Other Reaction(s): liver demage   Morphine  Sulfate Hives and Shortness Of Breath   Triamterene Hives   Aspirin -Caffeine Diarrhea and Other (See Comments)    liver damage; upset stomach  Other Reaction(s): liver demage   Dyazide [Hydrochlorothiazide -Triamterene] Hives  Empagliflozin Diarrhea    (Jardiance) stomach ache   Metformin  Hcl Er Other (See Comments) and Nausea Only    upset stomach   Topamax  [Topiramate ] Other (See Comments)    Vision disturbances.   Citalopram  Itching, Rash and Other (See Comments)   Emtricitabine -Tenofovir   Df Rash    Descovy    Lisinopril  Other (See Comments)    Cough    Losartan  Nausea Only and Rash    Pt had rash, worsening dizziness, and nausea after starting losartan , which improved after stopping losartan    Triamterene-Hctz Rash    Physical Exam: BP (!) 138/90   Pulse 83   Temp 98.5 F (36.9 C)   SpO2 96%    Physical Exam Vitals reviewed.  Constitutional:      Appearance: Normal appearance.  HENT:     Head: Normocephalic and atraumatic.     Right Ear: Tympanic membrane normal.     Left Ear: Tympanic membrane normal.     Nose:     Comments: Bilateral nares edematous and pale with thin clear nasal drainage noted.  Pharynx normal.  Ears normal.  Eyes normal.    Mouth/Throat:     Pharynx: Oropharynx is clear.  Eyes:     Conjunctiva/sclera: Conjunctivae normal.  Cardiovascular:     Rate and Rhythm: Normal rate and regular rhythm.     Heart sounds: Normal heart sounds. No murmur heard. Pulmonary:     Effort: Pulmonary effort is normal.     Breath sounds: Normal breath sounds.     Comments: Lungs clear to auscultation Musculoskeletal:        General: Normal range of motion.     Cervical back: Normal range of motion and neck supple.  Skin:    General: Skin is warm and dry.  Neurological:     Mental Status: She is alert and oriented to person, place, and time.  Psychiatric:        Mood and Affect: Mood normal.        Behavior: Behavior normal.        Thought Content: Thought content normal.        Judgment: Judgment normal.     Diagnostics: FVC 2.00 which is 84% of predicted value, FEV1 1.65 which is 87% of predicted value.  Spirometry indicates normal ventilatory function.  Assessment and Plan: 1. Severe persistent asthma without complication (HCC)   2. Pruritic disorder   3. LPRD (laryngopharyngeal reflux disease)     Meds ordered this encounter  Medications   cetirizine  (ZYRTEC ) 10 MG tablet    Sig: Take 2 tablets (20 mg total) by mouth daily as needed for  allergies.    Dispense:  180 tablet    Refill:  1    Patient Instructions   1.  Continue to Treat and prevent inflammation:   A.  Montelukast  10 mg - 1 tablet 1 time per day  B.  Symbicort  160 - 2 inhalations 2 times per day  C.  Nasacort  -1 spray each nostril 1-2 times per day  D.  Continue Tezspire  injections once a month   2.  Continue to Treat and prevent reflux:   A.  Protonix  40 mg 2 times per day  B.  Famotidine  40 mg in p.m.    3.  Continue to treat and prevent itchiness:    A. cetirizine  10 mg -1-2 tablets 1-2 times a day   B. Gabapentin  200 mg - 200 / 200 / 400  4.  If needed:  A.  OTC Benadryl   B.  OTC nasal saline  C.  Pro Air HFA 2 puffs every 4-6 hours  D.  Nasal saline  5. Return to clinic in 6 months or earlier if problem  6. Influenza = Tamifu. Covid = Paxlovid  No follow-ups on file.    Thank you for the opportunity to care for this patient.  Please do not hesitate to contact me with questions.  Arlean Mutter, FNP [facility] 

## 2024-02-22 ENCOUNTER — Ambulatory Visit: Admitting: Family Medicine

## 2024-02-22 ENCOUNTER — Ambulatory Visit

## 2024-02-22 ENCOUNTER — Ambulatory Visit: Admitting: Physical Therapy

## 2024-02-22 ENCOUNTER — Other Ambulatory Visit: Payer: Self-pay

## 2024-02-22 ENCOUNTER — Encounter: Payer: Self-pay | Admitting: Family Medicine

## 2024-02-22 ENCOUNTER — Other Ambulatory Visit (HOSPITAL_COMMUNITY): Payer: Self-pay

## 2024-02-22 VITALS — BP 128/80 | HR 83 | Temp 98.5°F

## 2024-02-22 DIAGNOSIS — J4550 Severe persistent asthma, uncomplicated: Secondary | ICD-10-CM | POA: Diagnosis not present

## 2024-02-22 DIAGNOSIS — K219 Gastro-esophageal reflux disease without esophagitis: Secondary | ICD-10-CM

## 2024-02-22 DIAGNOSIS — L299 Pruritus, unspecified: Secondary | ICD-10-CM | POA: Diagnosis not present

## 2024-02-22 MED ORDER — CETIRIZINE HCL 10 MG PO TABS: 20.0000 mg | ORAL_TABLET | Freq: Every day | ORAL | 1 refills | Status: AC | PRN

## 2024-02-22 NOTE — Progress Notes (Signed)
 Specialty Pharmacy Refill Coordination Note  [name] [name] is a 63 y.o. female assessed today regarding refills of clinic administered specialty medication(s) Cabotegravir  & Rilpivirine  (CABENUVA )   Clinic requested Courier to Provider Office   Delivery date: 02/29/24   Verified address: [address] [place] [address]   Medication will be filled on 02/28/24.

## 2024-02-26 ENCOUNTER — Encounter: Payer: Self-pay | Admitting: Radiology

## 2024-02-27 ENCOUNTER — Ambulatory Visit: Attending: Orthopedic Surgery

## 2024-02-27 DIAGNOSIS — M6281 Muscle weakness (generalized): Secondary | ICD-10-CM | POA: Diagnosis present

## 2024-02-27 DIAGNOSIS — Z96612 Presence of left artificial shoulder joint: Secondary | ICD-10-CM | POA: Insufficient documentation

## 2024-02-27 DIAGNOSIS — G8929 Other chronic pain: Secondary | ICD-10-CM | POA: Diagnosis present

## 2024-02-27 DIAGNOSIS — M25512 Pain in left shoulder: Secondary | ICD-10-CM | POA: Diagnosis present
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
## 2024-02-28 ENCOUNTER — Other Ambulatory Visit: Payer: Self-pay

## 2024-02-28 ENCOUNTER — Other Ambulatory Visit (HOSPITAL_COMMUNITY): Payer: Self-pay

## 2024-02-29 ENCOUNTER — Telehealth: Payer: Self-pay

## 2024-02-29 ENCOUNTER — Other Ambulatory Visit: Payer: Self-pay

## 2024-02-29 ENCOUNTER — Ambulatory Visit

## 2024-02-29 DIAGNOSIS — M6281 Muscle weakness (generalized): Secondary | ICD-10-CM

## 2024-02-29 DIAGNOSIS — Z96612 Presence of left artificial shoulder joint: Secondary | ICD-10-CM | POA: Diagnosis not present

## 2024-02-29 DIAGNOSIS — G8929 Other chronic pain: Secondary | ICD-10-CM

## 2024-02-29 NOTE — Telephone Encounter (Signed)
 [facility] Patient Advocate Encounter  Patient's medications [facility]  have been couriered to [facility] from [facility] and will be administered at the patients appointment on 03/04/24.  Charmaine Sharps, CPhT Specialty Pharmacy Patient [facility] Phone: [phone] Fax:  [phone]
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
## 2024-03-04 ENCOUNTER — Other Ambulatory Visit: Payer: Self-pay

## 2024-03-04 ENCOUNTER — Ambulatory Visit: Payer: Self-pay | Admitting: Infectious Diseases

## 2024-03-04 ENCOUNTER — Encounter: Payer: Self-pay | Admitting: Infectious Diseases

## 2024-03-04 VITALS — BP 145/87 | HR 80 | Resp 19 | Ht 61.0 in | Wt 167.2 lb

## 2024-03-04 DIAGNOSIS — Z23 Encounter for immunization: Secondary | ICD-10-CM

## 2024-03-04 DIAGNOSIS — B20 Human immunodeficiency virus [HIV] disease: Secondary | ICD-10-CM

## 2024-03-04 DIAGNOSIS — N951 Menopausal and female climacteric states: Secondary | ICD-10-CM | POA: Diagnosis not present

## 2024-03-04 MED ORDER — CABOTEGRAVIR & RILPIVIRINE ER 600 & 900 MG/3ML IM SUER
1.0000 | Freq: Once | INTRAMUSCULAR | Status: AC
  Administered 2024-03-04: 1 via INTRAMUSCULAR

## 2024-03-04 NOTE — Progress Notes (Signed)
 I saw the patient independently of the student NP and conducted my own interview / exam.  I agree with the documentation provided with the following additions:   [name] [name] is a 63 y.o. female   Discussed the use of AI scribe software for clinical note transcription with the patient, who gave verbal consent to proceed.  History of Present Illness   [name] [name] is a 63 year old female with HIV who presents for a follow-up visit.  She is currently on Cabenuva  for HIV management, which she reports is going well.  She is taking Behoza for hot flashes and night sweats, which has been effective, although she still experiences breakthrough night sweats. She typically takes Veozah  at night but sometimes at 10 PM.   She is nearing the end of her therapy following a shoulder replacement surgery, which she describes as 'grueling' but notes significant progress. She has undergone three surgeries related to this issue.  She is a [employer] and does not accept blood products. She has not had a Pap smear recently, with the last one being in 2024, and she is due for another in 2027.     Physical Exam Cardiovascular:     Rate and Rhythm: Normal rate and regular rhythm.  Pulmonary:     Effort: Pulmonary effort is normal.  Neurological:     Mental Status: She is oriented to person, place, and time.  Psychiatric:        Mood and Affect: Mood normal.        Behavior: Behavior normal.        Thought Content: Thought content normal.        Judgment: Judgment normal.    HIV 1 RNA Quant  Date Value  08/24/2023 21 copies/mL (H)  04/25/2023 Not Detected Copies/mL  03/06/2023 Not Detected Copies/mL   CD4 T Cell Abs (/uL)  Date Value  08/24/2023 518  03/06/2023 516  02/08/2022 487   Assessment and Plan    Human immunodeficiency virus (HIV) - HIV disease is well-managed with Cabenuva . She is up to date with her Cabenuva  injection. Pneumococcal vaccination is pending. Liver function and  CD4 count will be monitored to ensure continued efficacy of treatment. Pap smear screening is up to date until 2027. Discussion about the appropriateness of continuing Pap smears beyond age 73, considering her history of normal HPV screenings and current guidelines for women with HIV. - Ordered liver function tests and CD4 count. - Continue Cabenuva  as prescribed. - Will discuss Pap smear screening schedule at age 89.  Menopausal symptoms (hot flashes and night sweats) - Menopausal symptoms, including hot flashes and night sweats, are managed with Veozah . Symptoms persist, particularly at night, possibly due to inconsistent timing of medication intake. Current dose is optimized, and no increase is recommended. - Advised taking Veozah  consistently in the evening around dinnertime to manage symptoms.  Vaccination Counseling -  COVID19 booster provided today. Otherwise up to date for age and condition specific recommendations.      Meds ordered this encounter  Medications   cabotegravir  & rilpivirine  ER (CABENUVA ) 600 & 900 MG/3ML injection 1 kit   Orders Placed This Encounter  Procedures   PFIZER Comirnaty(GRAY TOP)COVID-19 Vaccine   HIV 1 RNA quant-no reflex-bld   COMPLETE METABOLIC PANEL WITHOUT GFR   T-helper cells (CD4) count    Corean Fireman, MSN, NP-C [facility] Infectious [facility]  [email or web address] Pager: [phone] Office: [phone] [facility] Main Line: [phone] *Secure  Chat Communication Welcome

## 2024-03-04 NOTE — Patient Instructions (Addendum)
 Last pneumonia shot today!    While the medication does not require a specific time tied to meals or sleep, here are considerations to help choose when to take it:  Since hot flashes and night sweats can occur both during the day and night, taking it at a consistent time helps maintain steady effect.  If you find that your symptoms are worse in the evening/night (e.g., night sweats), taking it in the evening may be beneficial -- but check with your doctor.  If your symptoms are mostly during the day, a morning time may make sense.  Because one of the side-effects is insomnia (difficulty sleeping) in some people.  [email or web address] +1  If you experience this, it might be better to take it earlier rather than right before bed.  Next appointment scheduled 04/29/2024

## 2024-03-04 NOTE — Progress Notes (Signed)
 What time to take veozah 

## 2024-03-04 NOTE — Progress Notes (Signed)
 Patient: [name] [name]  DOB: 24-Apr-1961 MRN: [id number] PCP: Elnora Ip, MD  Referring Provider:   Reason for Visit: 2 month follow up  Chief Complaint  Patient presents with   Medical Management of Chronic Issues      Subjective   Subjective:   [name] [name] is a 63 year old female in clinic for her 2 month follow up on Cabenuva . Last seen on 08/24/2023, she reports no changes to her health. She continues to see PT for therapy session for her left shoulder after shoulder surgery. She denies recent hospitalizations or ER visits. Current ART regimen includes Cabenuva , every 2 months. She states adherence and reports injection site soreness, which resolves. She denies no new medications.  She endorses good sleep and denies no issues falling or staying asleep. She denies any mood or cognitive changes. She denies hospitalizations or ER visits. She reports stable housing, food, and transportation. She denies no new sexual partners. STI screening offered. Preventive screenings reviewed and has mammogram, 03/2024, colonoscopy up to date, next pap in 2027, and dental is up to date. Immunizations reviewed and up to date. She has no other concerns at this time.   Review of Systems  Constitutional: Negative.   Respiratory: Negative.    Cardiovascular: Negative.   Gastrointestinal: Negative.   Genitourinary: Negative.   Skin: Negative.   Neurological: Negative.   Psychiatric/Behavioral: Negative.      Past Medical History:  Diagnosis Date   Addison's disease (HCC)    Allergic rhinitis 05/09/2006   Allergy     Anxiety    Arthritis    Asthma    Blood dyscrasia    HIV   CAD (coronary artery disease) 03/19/2023   CCTA 03/16/23: CAC score 471 (98th percentile), RCA diffuse 25-49, dLM minimal Ca2+ plaque, LAD prox minimal plaques, pLCx < 24; TPV 542 mm3 (86th percentile)    CHF (congestive heart failure) (HCC)    Chronic back pain    Chronic night sweats 08/23/2022    Depression    Diabetes mellitus without complication (HCC) 04/25/2014   GERD (gastroesophageal reflux disease)    Heart murmur    as a child   Hepatitis C    genotype 1b, 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 s/p 12 week course of simeprevir and sofosbuvir between October 2014 and January 2015 with resolution.   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 90 tablet 3   aspirin  EC 81 MG tablet Take 1 tablet (81 mg total) by mouth daily. Swallow whole. 30 tablet 12   atorvastatin  (LIPITOR) 10 MG tablet Take 0.5 tablets (5 mg total) by mouth daily. 45 each 10   betamethasone  valerate ointment (  VALISONE ) 0.1 % Apply 1 Application topically 2 (two) times daily. 30 g 0   cabotegravir  & rilpivirine  ER (CABENUVA ) 600 & 900 MG/3ML injection Inject 1 kit into the muscle every 2 (two) months. 6 mL 5   cetirizine  (ZYRTEC ) 10 MG tablet Take 2 tablets (20 mg total) by mouth daily as needed for allergies. 180 tablet 1   chlorthalidone  (HYGROTON ) 25 MG tablet Take 1 tablet (25 mg total) by mouth daily. 90 tablet 1   cholecalciferol  (VITAMIN D3) 25 MCG (1000 UNIT) tablet Take 3 tablets (3,000 Units total) by mouth daily. 270 tablet 3   ciprofloxacin -dexamethasone  (CIPRODEX ) OTIC suspension Place 4 drops into the left ear as needed (ear  infection).     diazepam  (VALIUM ) 5 MG tablet Take 1 by mouth 1 hour  pre-procedure with very light food. Do not drive motor vehicle. 1 tablet 0   docusate sodium  (COLACE) 100 MG capsule Take 1 capsule (100 mg total) by mouth 2 (two) times daily. 10 capsule 0   ezetimibe  (ZETIA ) 10 MG tablet Take 1 tablet (10 mg total) by mouth daily. 90 tablet 3   famotidine  (PEPCID ) 40 MG tablet Take 1 tablet (40 mg total) by mouth at bedtime. 90 tablet 3   Fezolinetant  (VEOZAH ) 45 MG TABS Take 1 tablet (45 mg total) by mouth daily. 30 tablet 4   gabapentin  (NEURONTIN ) 400 MG capsule Take 1 capsule (400 mg total) by mouth at bedtime. 90 capsule 1   GLOBAL EASE INJECT PEN NEEDLES 32G X 4 MM MISC USE AS DIRECTED WITH VICTOZA for 30     glucose blood test strip Use as instructed 100 each 12   hydrocortisone  (CORTEF ) 5 MG tablet Take 2.5-5 mg by mouth See admin instructions. Take 1 tablet (5 mg) by mouth in the morning (scheduled) & may taken an additional 0.5 tablet (2.5 mg) by mouth in the evening if needed.     ibuprofen  (ADVIL ) 800 MG tablet Take 1 tablet (800 mg total) by mouth every 8 (eight) hours as needed. 60 tablet 1   liraglutide (VICTOZA) 18 MG/3ML SOPN Inject 1.8 mg into the skin every evening. (1700)     methocarbamol  (ROBAXIN ) 500 MG tablet Take 1 tablet (500 mg total) by mouth every 6 (six) hours as needed for muscle spasms. 30 tablet 0   metoprolol  tartrate (LOPRESSOR ) 50 MG tablet Take 1 tablet (50 mg total) by mouth 2 (two) times daily. 180 tablet 3   montelukast  (SINGULAIR ) 10 MG tablet Take 1 tablet (10 mg total) by mouth at bedtime. 90 tablet 1   NEURONTIN  100 MG capsule TAKE TWO CAPSULES (200 MG TOTAL) BY MOUTH TWO (TWO) TIMES DAILY. TAKE 1-2 CAPSULES EVERY 3-4 TIMES DAILY. 360 capsule 1   nitroGLYCERIN  (NITROSTAT ) 0.4 MG SL tablet Place 1 tablet (0.4 mg total) under the tongue every 5 (five) minutes as needed for chest pain. 25 tablet 1   nortriptyline  (PAMELOR ) 10 MG capsule TAKE TWO CAPSULES  BY MOUTH AT BEDTIME 180 capsule 10   ondansetron  (ZOFRAN ) 4 MG tablet Take 1 tablet (4 mg total) by mouth every 8 (eight) hours as needed for nausea or vomiting. 20 tablet 0   pantoprazole  (PROTONIX ) 40 MG tablet Take 1 tablet (40 mg total) by mouth 2 (two) times daily. 180 tablet 3   potassium chloride  SA (KLOR-CON  M) 20 MEQ tablet Take 2 tablets (40 mEq total) by mouth daily. 180 tablet 3   psyllium (HYDROCIL/METAMUCIL) 95 % PACK Take 1 packet by mouth daily.  sertraline  (ZOLOFT ) 100 MG tablet Take 1 tablet (100 mg total) by mouth daily. 90 tablet 1   sodium chloride  (OCEAN) 0.65 % SOLN nasal spray PLACE 1 SPRAY INTO BOTH NOSTRILS AS NEEDED FOR CONGESTION. 3 Bottle 0   SUMAtriptan  (IMITREX ) 25 MG tablet Take 1 tablet (25 mg total) by mouth every 2 (two) hours as needed for migraine. May repeat in 2 hours if headache persists or recurs. 12 tablet 6   SYMBICORT  160-4.5 MCG/ACT inhaler Inhale 2 puffs into the lungs in the morning and at bedtime. 30.6 g 1   Tezepelumab -ekko (TEZSPIRE ) 210 MG/1. SOAJ Inject 210 mg into the skin every 28 (twenty-eight) days. 1.91 mL 11   triamcinolone  (NASACORT ) 55 MCG/ACT AERO nasal inhaler Place 1 spray into the nose 2 (two) times daily. 1 spray each nostril 2 times per day 50.7 g 1   VENTOLIN  HFA 108 (90 Base) MCG/ACT inhaler INHALE TWO PUFFS INTO THE LUNGS EVERY 4 (FOUR) HOURS AS NEEDED FOR WHEEZING OR SHORTNESS OF BREATH. 18 g 0   traMADol  (ULTRAM ) 50 MG tablet Take 1 tablet (50 mg total) by mouth every 6 (six) hours as needed. (Patient not taking: Reported on 03/04/2024) 30 tablet 0   Facility-Administered Medications Prior to Visit  Medication Dose Route Frequency Provider Last Rate Last Admin   tezepelumab -ekko (TEZSPIRE ) 210 MG/1. syringe 210 mg  210 mg Subcutaneous Q28 days Maurilio Camellia PARAS, MD   210 mg at 02/22/24 [id number]     Allergies  Allergen Reactions   Acetaminophen  Other (See Comments)    Inflamed liver, hospitalized  Other Reaction(s):  liver demage   Morphine  Sulfate Hives and Shortness Of Breath   Triamterene Hives   Aspirin -Caffeine Diarrhea and Other (See Comments)    liver damage; upset stomach  Other Reaction(s): liver demage   Dyazide [Hydrochlorothiazide -Triamterene] Hives   Empagliflozin Diarrhea    (Jardiance) stomach ache   Metformin  Hcl Er Other (See Comments) and Nausea Only    upset stomach   Topamax  [Topiramate ] Other (See Comments)    Vision disturbances.   Whole Blood     Jehova's Witness    Citalopram  Itching, Rash and Other (See Comments)   Emtricitabine -Tenofovir  Df Rash    Descovy    Lisinopril  Other (See Comments)    Cough    Losartan  Nausea Only and Rash    Pt had rash, worsening dizziness, and nausea after starting losartan , which improved after stopping losartan    Triamterene-Hctz Rash    Social History   Tobacco Use   Smoking status: Former    Current packs/day: 0.00    Types: Cigarettes    Start date: 04/26/1987    Quit date: 04/26/2007    Years since quitting: 16.8   Smokeless tobacco: Never   Tobacco comments:    QUIT 2009  Vaping Use   Vaping status: Never Used  Substance Use Topics   Alcohol use: No    Alcohol/week: 0.0 standard drinks of alcohol   Drug use: Yes    Types: Marijuana    Family History  Problem Relation Age of Onset   Heart disease Mother    Diabetes Mother    Stroke Mother    Heart disease Father    Stroke Father    Diabetes Father    Hepatitis Sister        hcv   Asthma Sister    Allergic rhinitis Sister    Stroke Other    Colon polyps Brother    Renal Disease Brother  Cancer Sister        lung   Asthma Sister    Allergic rhinitis Sister    Cancer Maternal Aunt    Cancer Maternal Aunt    Colon cancer Neg Hx    Esophageal cancer Neg Hx    Stomach cancer Neg Hx    Rectal cancer Neg Hx    Angioedema Neg Hx    Eczema Neg Hx    Urticaria Neg Hx        Objective   Objective:   Vitals:   03/04/24 1115  BP: (!) 145/87  Pulse: 80   Resp: 19  SpO2: 100%  Weight: 167 lb 3.2 oz (75.8 kg)  Height: 5' 1 (1.549 m)   Body mass index is 31.59 kg/m.  Physical Exam Constitutional:      Appearance: Normal appearance.  Cardiovascular:     Rate and Rhythm: Normal rate and regular rhythm.  Pulmonary:     Effort: Pulmonary effort is normal.     Breath sounds: Normal breath sounds.  Neurological:     Mental Status: She is alert and oriented to person, place, and time.  Psychiatric:        Mood and Affect: Mood normal.        Behavior: Behavior normal.        Thought Content: Thought content normal.        Judgment: Judgment normal.        Assessment & Plan:   Problem List Items Addressed This Visit       Other   HIV disease (HCC) - Primary (Chronic)   Relevant Orders   HIV 1 RNA quant-no reflex-bld   COMPLETE METABOLIC PANEL WITHOUT GFR   T-helper cells (CD4) count   Other Visit Diagnoses       Encounter for immunization       Relevant Orders   PFIZER Comirnaty(GRAY TOP)COVID-19 Vaccine (Completed)       Assessment and Plan  -HIV disease Current regimen includes every 2 month Cabenuva . Last HIV-1 RNA 21 and CD4 518. She maintain adherence to every two month schedule and reports no barriers in accessing medication. Reports injection site soreness, which resolves. Will order CMP, HIV-1 RNA and CD4 today. Continue with current regimen.   -Healthcare Maintenance  Preventive care reviewed and up to date. She has a mammogram scheduled for 03/2024. No dental needs today. Immunizations reviewed and up to date, will give COVID vaccine today. She is not sexually active and declines STI screening today.      Orders Placed This Encounter  Procedures   PFIZER Comirnaty(GRAY TOP)COVID-19 Vaccine   HIV 1 RNA quant-no reflex-bld   COMPLETE METABOLIC PANEL WITHOUT GFR   T-helper cells (CD4) count    Meds ordered this encounter  Medications   cabotegravir  & rilpivirine  ER (CABENUVA ) 600 & 900 MG/3ML  injection 1 kit    No follow-ups on file.   Sahira Cataldi, Student NP

## 2024-03-05 LAB — T-HELPER CELLS (CD4) COUNT (NOT AT ARMC)
CD4 % Helper T Cell: 30 % — ABNORMAL LOW (ref 33–65)
CD4 T Cell Abs: 655 /uL (ref 400–1790)

## 2024-03-06 ENCOUNTER — Ambulatory Visit: Admitting: Physical Therapy

## 2024-03-06 ENCOUNTER — Encounter: Payer: Self-pay | Admitting: Physical Therapy

## 2024-03-06 DIAGNOSIS — M6281 Muscle weakness (generalized): Secondary | ICD-10-CM

## 2024-03-06 DIAGNOSIS — Z96612 Presence of left artificial shoulder joint: Secondary | ICD-10-CM | POA: Diagnosis not present

## 2024-03-06 LAB — COMPLETE METABOLIC PANEL WITHOUT GFR
AG Ratio: 1.5 (calc) (ref 1.0–2.5)
ALT: 25 U/L (ref 6–29)
AST: 24 U/L (ref 10–35)
Albumin: 4.7 g/dL (ref 3.6–5.1)
Alkaline phosphatase (APISO): 55 U/L (ref 37–153)
BUN: 11 mg/dL (ref 7–25)
CO2: 30 mmol/L (ref 20–32)
Calcium: 10.1 mg/dL (ref 8.6–10.4)
Chloride: 99 mmol/L (ref 98–110)
Creat: 0.82 mg/dL (ref 0.50–1.05)
Globulin: 3.2 g/dL (ref 1.9–3.7)
Glucose, Bld: 101 mg/dL — ABNORMAL HIGH (ref 65–99)
Potassium: 3.2 mmol/L — ABNORMAL LOW (ref 3.5–5.3)
Sodium: 140 mmol/L (ref 135–146)
Total Bilirubin: 0.4 mg/dL (ref 0.2–1.2)
Total Protein: 7.9 g/dL (ref 6.1–8.1)

## 2024-03-06 LAB — HIV-1 RNA QUANT-NO REFLEX-BLD
HIV 1 RNA Quant: NOT DETECTED {copies}/mL
HIV-1 RNA Quant, Log: NOT DETECTED {Log_copies}/mL
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
## 2024-03-07 ENCOUNTER — Ambulatory Visit: Payer: Self-pay | Admitting: Infectious Diseases

## 2024-03-07 ENCOUNTER — Other Ambulatory Visit: Payer: Self-pay

## 2024-03-07 DIAGNOSIS — H2513 Age-related nuclear cataract, bilateral: Secondary | ICD-10-CM | POA: Diagnosis not present

## 2024-03-07 DIAGNOSIS — H16223 Keratoconjunctivitis sicca, not specified as Sjogren's, bilateral: Secondary | ICD-10-CM | POA: Diagnosis not present

## 2024-03-07 DIAGNOSIS — E113291 Type 2 diabetes mellitus with mild nonproliferative diabetic retinopathy without macular edema, right eye: Secondary | ICD-10-CM | POA: Diagnosis not present

## 2024-03-07 DIAGNOSIS — H40013 Open angle with borderline findings, low risk, bilateral: Secondary | ICD-10-CM | POA: Diagnosis not present

## 2024-03-07 LAB — OPHTHALMOLOGY REPORT-SCANNED

## 2024-03-07 MED ORDER — POTASSIUM CHLORIDE CRYS ER 20 MEQ PO TBCR: 40.0000 meq | EXTENDED_RELEASE_TABLET | Freq: Every day | ORAL | 3 refills | Status: DC

## 2024-03-08 ENCOUNTER — Other Ambulatory Visit: Payer: Self-pay | Admitting: Allergy and Immunology

## 2024-03-08 ENCOUNTER — Ambulatory Visit

## 2024-03-13 ENCOUNTER — Other Ambulatory Visit: Payer: Self-pay

## 2024-03-13 ENCOUNTER — Other Ambulatory Visit (HOSPITAL_COMMUNITY): Payer: Self-pay

## 2024-03-13 MED ORDER — POTASSIUM CHLORIDE 20 MEQ/15ML (10%) PO SOLN: 40.0000 meq | Freq: Two times a day (BID) | ORAL | 11 refills | Status: AC

## 2024-03-13 NOTE — Progress Notes (Signed)
 Specialty Pharmacy Refill Coordination Note  [name] [name] is a 63 y.o. female assessed today regarding refills of clinic administered specialty medication(s) Tezepelumab -ekko (TEZSPIRE )   Clinic requested Courier to Provider Office   Delivery date: 03/19/24   Verified address: [facility]  [address] [place] [address]   Medication will be filled on: 03/18/24

## 2024-03-13 NOTE — Progress Notes (Deleted)
 Specialty Pharmacy Refill Coordination Note  [name] [name] is a 63 y.o. female assessed today regarding refills of clinic administered specialty medication(s) Tezepelumab -ekko (TEZSPIRE )   Clinic requested Courier to Provider Office   Delivery date: 03/19/24   Verified address: [address] [place] [address]   Medication will be filled on: 03/18/24

## 2024-03-15 ENCOUNTER — Other Ambulatory Visit: Payer: Self-pay | Admitting: *Deleted

## 2024-03-15 NOTE — Patient Instructions (Signed)
 Visit Information  Ms. Grunewald was given information about Medicaid Managed Care team care coordination services as a part of their [facility] Community Plan Medicaid benefit.   If you would like to schedule transportation through your [facility], please call the following number at least 2 days in advance of your appointment: [phone]   Rides for urgent appointments can also be made after hours by calling Member Services.  Call the Behavioral Health Crisis Line at [phone], at any time, 24 hours a day, 7 days a week. If you are in danger or need immediate medical attention call 911.  Please see education materials related to HTN provided by MyChart link.  Care plan and visit instructions communicated with the patient verbally today. Patient agrees to receive a copy in MyChart. Active MyChart status and patient understanding of how to access instructions and care plan via MyChart confirmed with patient.     Telephone follow up appointment with Managed Medicaid care management team member scheduled for:04/11/2024 @ 10:00 am    Olam Ku, RN, BSN [place]  [facility], [facility] Health RN Care Manager Direct Dial: [phone]  Fax: [phone]

## 2024-03-15 NOTE — Patient Outreach (Signed)
 Complex Care Management   Visit Note  03/15/2024  Name:  ZAURIA DOMBEK MRN: [id number] DOB: January 30, 1961  Situation: Referral received for Complex Care Management related to HTN/DM I obtained verbal consent from Patient.  Visit completed with Patient  on the phone  Background:   Past Medical History:  Diagnosis Date   Addison's disease (HCC)    Allergic rhinitis 05/09/2006   Allergy     Anxiety    Arthritis    Asthma    Blood dyscrasia    HIV   CAD (coronary artery disease) 03/19/2023   CCTA 03/16/23: CAC score 471 (98th percentile), RCA diffuse 25-49, dLM minimal Ca2+ plaque, LAD prox minimal plaques, pLCx < 24; TPV 542 mm3 (86th percentile)    CHF (congestive heart failure) (HCC)    Chronic back pain    Chronic night sweats 08/23/2022   Depression    Diabetes mellitus without complication (HCC) 04/25/2014   GERD (gastroesophageal reflux disease)    Heart murmur    as a child   Hepatitis C    genotype 1b, 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 s/p 12 week course of simeprevir and sofosbuvir between October 2014 and January 2015 with resolution.   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   Urticaria    Vaginal atrophy 10/20/2022    Assessment: Care management services presented for HTN management however RN inquired on her diabetes with CBG this AM at 156 and last A1C 6.6 % in Oct and improving with ongoing lifestyle modifications. Patient Reported Symptoms:  Cognitive Cognitive Status: Able to follow simple commands, Alert and oriented to person, place, and time, Normal speech and language skills    Health Maintenance Behaviors: Annual physical exam  Neurological Neurological Review of Symptoms: Headaches Neurological Management Strategies: Routine screening, Medication therapy Neurological Comment: Takes prescribed medication that helps  HEENT HEENT Symptoms Reported: No symptoms reported HEENT Management Strategies: Routine screening HEENT Comment: Hearing aide in left ear. Recent eye exam result in new prescription for glasses Ear problem(s)  Cardiovascular Cardiovascular Symptoms Reported: No symptoms reported Does patient have uncontrolled Hypertension?: Yes Is patient checking Blood Pressure at home?: Yes Patient's Recent BP reading at home: 03/14/2024 home device reading 129/81 with HR 76 and pt remains asymptomatic with ongoing prescribed medications Cardiovascular Management Strategies: Coping strategies, Medical device, Medication therapy, Routine screening Weight: 167 lb (75.8 kg) Cardiovascular Self-Management Outcome: 4 (good)  Respiratory Respiratory Symptoms Reported: No symptoms reported Additional Respiratory Details: Uses inhaler as prescribed for seasonal allergies when needed Respiratory Management Strategies: Breathing exercise, Routine screening, Coping strategies, Medication therapy Respiratory Self-Management Outcome: 4 (good)  Endocrine Endocrine Symptoms Reported: No symptoms reported Is patient diabetic?: Yes Is patient checking blood sugars at home?: Yes List most recent blood sugar readings, include date and time of day: Fasting CBG 156 this morning and continue to follow up with Dr. Faythe. Endocrine Self-Management Outcome: 4 (good)  Gastrointestinal Gastrointestinal Symptoms Reported: No symptoms reported      Genitourinary Genitourinary Symptoms Reported: No symptoms reported Genitourinary Self-Management Outcome: 4 (good)  Integumentary Integumentary Symptoms Reported: No symptoms reported Skin Management Strategies: Routine screening Skin  Comment: Prescribed medication used  Musculoskeletal Musculoskelatal Symptoms Reviewed: Weakness Additional Musculoskeletal Details: Pending a bone density tes due to fragile bones. Musculoskeletal Management Strategies: Medication therapy, Medical device, Routine screening Musculoskeletal Self-Management Outcome: 4 (good)      Psychosocial Psychosocial Symptoms Reported: No symptoms reported Behavioral Management Strategies: Medication therapy, Coping strategies, Adequate rest, Support system Major Change/Loss/Stressor/Fears (CP): Denies      Today's Vitals   03/15/24 1057  Weight: 167 lb (75.8 kg)   Pain Scale: 0-10 Pain Score: 0-No pain Pain Intervention(s): Medication (See eMAR)  Medications Reviewed Today     Reviewed by Alvia Olam BIRCH, RN (Registered Nurse) on 03/15/24 at 1052  Med List Status: <None>   Medication Order Taking? Sig Documenting Provider Last Dose Status Informant  ACCU-CHEK GUIDE test strip [id number] Yes use to check blood sugar In Vitro Once a day DX: E11.9 for 90 days [provider]  Active Self  amLODipine  (NORVASC ) 10 MG tablet [id number] Yes Take 1 tablet (10 mg total) by mouth daily. Gomez-Caraballo, Maria, MD  Active Self  aspirin  EC 81 MG tablet [id number] Yes Take 1 tablet (81 mg total) by mouth daily. Swallow whole. Addie Cordella Hamilton, MD  Active   atorvastatin  (LIPITOR) 10 MG tablet [id number] Yes Take 0.5 tablets (5 mg total) by mouth daily. Gomez-Caraballo, Maria, MD  Active Self  betamethasone  valerate ointment (VALISONE ) 0.1 % [id number] Yes Apply 1 Application topically 2 (two) times daily. Melvenia Corean SAILOR, NP  Active Self  cabotegravir  & rilpivirine  ER (CABENUVA ) 600 & 900 MG/3ML injection [id number] Yes Inject 1 kit into the muscle every 2 (two) months. Waddell Alan PARAS, RPH-CPP  Active   cetirizine  (ZYRTEC ) 10 MG tablet [id number] Yes Take 2 tablets (20 mg total) by mouth daily as needed for allergies. Cari Arlean HERO, FNP  Active    chlorthalidone  (HYGROTON ) 25 MG tablet [id number] Yes Take 1 tablet (25 mg total) by mouth daily. Tobie Gaines, DO  Active Self  cholecalciferol  (VITAMIN D3) 25 MCG (1000 UNIT) tablet [id number] Yes Take 3 tablets (3,000 Units total) by mouth daily. Nooruddin, Saad, MD  Active   ciprofloxacin -dexamethasone  (CIPRODEX ) OTIC suspension [id number] Yes Place 4 drops into the left ear as needed (ear infection). [provider]  Active Self  diazepam  (VALIUM ) 5 MG tablet [id number] Yes Take 1 by mouth 1 hour  pre-procedure with very light food. Do not drive motor vehicle. Eldonna Novel, MD  Active   docusate sodium  (COLACE) 100 MG capsule [id number] Yes Take 1 capsule (100 mg total) by mouth 2 (two) times daily. Addie Cordella Hamilton, MD  Active   ezetimibe  (ZETIA ) 10 MG tablet [id number] Yes Take 1 tablet (10 mg total) by mouth daily. Gomez-Caraballo, Maria, MD  Active Self  famotidine  (PEPCID ) 40 MG tablet [id number] Yes Take 1 tablet (40 mg total) by mouth at bedtime. Gomez-Caraballo, Maria, MD  Active Self  Fezolinetant  (VEOZAH ) 45 MG TABS [id number] Yes Take 1 tablet (45 mg total) by mouth daily. Melvenia Corean SAILOR, NP  Active   gabapentin  (NEURONTIN ) 400 MG capsule [id number] Yes TAKE 1 CAPSULE (400 MG TOTAL) BY MOUTH AT BEDTIME Ambs, Arlean HERO, FNP  Active   GLOBAL EASE INJECT PEN NEEDLES 32G X 4 MM MISC [id number] Yes USE AS DIRECTED WITH VICTOZA for 30 [provider]  Active Self  glucose blood test strip [id number] Yes Use as instructed Tobie Gaines, DO  Active Self  hydrocortisone  (  CORTEF ) 5 MG tablet [id number] Yes Take 2.5-5 mg by mouth See admin instructions. Take 1 tablet (5 mg) by mouth in the morning (scheduled) & may taken an additional 0.5 tablet (2.5 mg) by mouth in the evening if needed. [provider]  Active Self           Med Note STEPHEN OLIVIA Kitchens Feb 27, 2023  3:23 PM)    ibuprofen  (ADVIL ) 800 MG tablet [id number] Yes Take 1 tablet (800 mg total) by mouth every 8  (eight) hours as needed. Magnant, Carlin CROME, PA-C  Active   liraglutide (VICTOZA) 18 MG/3ML SOPN [id number] Yes Inject 1.8 mg into the skin every evening. (1700) [provider]  Active Self  methocarbamol  (ROBAXIN ) 500 MG tablet [id number]  Take 1 tablet (500 mg total) by mouth every 6 (six) hours as needed for muscle spasms.  Patient not taking: Reported on 03/15/2024   Shirly Carlin CROME, PA-C  Active   metoprolol  tartrate (LOPRESSOR ) 50 MG tablet [id number] Yes Take 1 tablet (50 mg total) by mouth 2 (two) times daily. Gomez-Caraballo, Maria, MD  Active Self  montelukast  (SINGULAIR ) 10 MG tablet [id number] Yes Take 1 tablet (10 mg total) by mouth at bedtime. Kozlow, Eric J, MD  Active Self  NEURONTIN  100 MG capsule [id number] Yes TAKE TWO CAPSULES (200 MG TOTAL) BY MOUTH TWO (TWO) TIMES DAILY. TAKE 1-2 CAPSULES EVERY 3-4 TIMES DAILY. Kozlow, Camellia PARAS, MD  Active   nitroGLYCERIN  (NITROSTAT ) 0.4 MG SL tablet [id number] Yes Place 1 tablet (0.4 mg total) under the tongue every 5 (five) minutes as needed for chest pain. Lou Claretta HERO, MD  Active Self  nortriptyline  (PAMELOR ) 10 MG capsule [id number] Yes TAKE TWO CAPSULES BY MOUTH AT BEDTIME Onita Duos, MD  Active Self  ondansetron  (ZOFRAN ) 4 MG tablet [id number] Yes Take 1 tablet (4 mg total) by mouth every 8 (eight) hours as needed for nausea or vomiting. Myrna Bitters, DO  Active   pantoprazole  (PROTONIX ) 40 MG tablet [id number] Yes Take 1 tablet (40 mg total) by mouth 2 (two) times daily. Elnora Ip, MD  Active Self  potassium chloride  20 MEQ/15ML (10%) SOLN [id number] Yes Take 30 mLs (40 mEq total) by mouth 2 (two) times daily. Melvenia Corean SAILOR, NP  Active   psyllium (HYDROCIL/METAMUCIL) 95 % PACK [id number] Yes Take 1 packet by mouth daily. [provider]  Active Self  sertraline  (ZOLOFT ) 100 MG tablet [id number] Yes Take 1 tablet (100 mg total) by mouth daily. Gomez-Caraballo, Maria, MD  Active Self  sodium chloride  (OCEAN)  0.65 % SOLN nasal spray [id number] Yes PLACE 1 SPRAY INTO BOTH NOSTRILS AS NEEDED FOR CONGESTION. Rosan Harlene Fickle, DO  Active Self  SUMAtriptan  (IMITREX ) 25 MG tablet [id number] Yes Take 1 tablet (25 mg total) by mouth every 2 (two) hours as needed for migraine. May repeat in 2 hours if headache persists or recurs. Onita Duos, MD  Active Self  SYMBICORT  160-4.5 MCG/ACT inhaler [id number] Yes Inhale 2 puffs into the lungs in the morning and at bedtime. Kozlow, Eric J, MD  Active Self  Tezepelumab -ekko (TEZSPIRE ) 210 MG/1. SOAJ [id number] Yes Inject 210 mg into the skin every 28 (twenty-eight) days. Kozlow, Eric J, MD  Active Self  tezepelumab -ekko (TEZSPIRE ) 210 MG/1. syringe 210 mg [id number]   Kozlow, Eric J, MD  Active   traMADol  (ULTRAM ) 50 MG tablet [id number] Yes Take 1 tablet (50 mg total) by mouth every 6 (six) hours as needed. Magnant, Carlin CROME, PA-C  Active   triamcinolone  (NASACORT ) 55 MCG/ACT AERO nasal inhaler [id number] Yes Place 1 spray into the nose 2 (two) times daily. 1 spray each nostril 2 times per day Maurilio Camellia PARAS, MD  Active Self  VENTOLIN  HFA 108 (90 Base) MCG/ACT inhaler [id number] Yes INHALE TWO PUFFS INTO THE LUNGS EVERY 4 (FOUR) HOURS AS NEEDED FOR WHEEZING OR SHORTNESS OF BREATH. Kozlow, Camellia PARAS, MD  Active Self            Recommendation:   PCP Follow-up Continue Current Plan of Care  Follow Up Plan:   Telephone follow up appointment date/time:  04/11/2024 @ 10:00 am   Olam Ku, RN, BSN [place]  [facility], [facility] RN Care Manager Direct Dial: [phone]  Fax: [phone]

## 2024-03-18 ENCOUNTER — Other Ambulatory Visit: Payer: Self-pay

## 2024-03-18 ENCOUNTER — Encounter: Payer: Self-pay | Admitting: Plastic Surgery

## 2024-03-18 ENCOUNTER — Ambulatory Visit: Admitting: Plastic Surgery

## 2024-03-18 VITALS — BP 129/77 | HR 84 | Ht 61.0 in | Wt 166.2 lb

## 2024-03-18 DIAGNOSIS — L989 Disorder of the skin and subcutaneous tissue, unspecified: Secondary | ICD-10-CM | POA: Diagnosis not present
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
## 2024-03-20 ENCOUNTER — Ambulatory Visit (INDEPENDENT_AMBULATORY_CARE_PROVIDER_SITE_OTHER)

## 2024-03-20 DIAGNOSIS — J4550 Severe persistent asthma, uncomplicated: Secondary | ICD-10-CM

## 2024-03-25 ENCOUNTER — Other Ambulatory Visit: Payer: Self-pay | Admitting: Student

## 2024-03-25 ENCOUNTER — Other Ambulatory Visit: Payer: Self-pay | Admitting: Allergy and Immunology

## 2024-03-25 DIAGNOSIS — L299 Pruritus, unspecified: Secondary | ICD-10-CM

## 2024-03-25 DIAGNOSIS — J3089 Other allergic rhinitis: Secondary | ICD-10-CM
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
## 2024-03-26 ENCOUNTER — Institutional Professional Consult (permissible substitution): Admitting: Plastic Surgery

## 2024-03-28 ENCOUNTER — Ambulatory Visit: Admitting: Neurology

## 2024-03-28 ENCOUNTER — Encounter: Payer: Self-pay | Admitting: Neurology

## 2024-03-28 VITALS — BP 132/84 | HR 77 | Ht 61.0 in | Wt 167.8 lb

## 2024-03-28 DIAGNOSIS — F19982 Other psychoactive substance use, unspecified with psychoactive substance-induced sleep disorder: Secondary | ICD-10-CM | POA: Diagnosis not present

## 2024-03-28 DIAGNOSIS — Z9189 Other specified personal risk factors, not elsewhere classified: Secondary | ICD-10-CM

## 2024-03-28 DIAGNOSIS — G478 Other sleep disorders: Secondary | ICD-10-CM | POA: Insufficient documentation

## 2024-03-28 DIAGNOSIS — D352 Benign neoplasm of pituitary gland: Secondary | ICD-10-CM

## 2024-03-28 DIAGNOSIS — E271 Primary adrenocortical insufficiency: Secondary | ICD-10-CM

## 2024-03-28 DIAGNOSIS — J0120 Acute ethmoidal sinusitis, unspecified: Secondary | ICD-10-CM

## 2024-03-28 NOTE — Patient Instructions (Signed)
 Insomnia Insomnia is a sleep disorder that makes it difficult to fall asleep or stay asleep. Insomnia can cause fatigue, low energy, difficulty concentrating, mood swings, and poor performance at work or school. There are three different ways to classify insomnia: Difficulty falling asleep. Difficulty staying asleep. Waking up too early in the morning. Any type of insomnia can be long-term (chronic) or short-term (acute). Both are common. Short-term insomnia usually lasts for 3 months or less. Chronic insomnia occurs at least three times a week for longer than 3 months. What are the causes? Insomnia may be caused by another condition, situation, or substance, such as: Having certain mental health conditions, such as anxiety and depression. Using caffeine, alcohol , tobacco, or drugs. Having gastrointestinal conditions, such as gastroesophageal reflux disease (GERD). Having certain medical conditions. These include: Asthma. Alzheimer's disease. Stroke. Chronic pain. An overactive thyroid  gland (hyperthyroidism). Other sleep disorders, such as restless legs syndrome and sleep apnea. Menopause. Sometimes, the cause of insomnia may not be known. What increases the risk? Risk factors for insomnia include: Gender. Females are affected more often than males. Age. Insomnia is more common as people get older. Stress and certain medical and mental health conditions. Lack of exercise. Having an irregular work schedule. This may include working night shifts and traveling between different time zones. What are the signs or symptoms? If you have insomnia, the main symptom is having trouble falling asleep or having trouble staying asleep. This may lead to other symptoms, such as: Feeling tired or having low energy. Feeling nervous about going to sleep. Not feeling rested in the morning. Having trouble concentrating. Feeling irritable, anxious, or depressed. How is this diagnosed? This condition  may be diagnosed based on: Your symptoms and medical history. Your health care provider may ask about: Your sleep habits. Any medical conditions you have. Your mental health. A physical exam. How is this treated? Treatment for insomnia depends on the cause. Treatment may focus on treating an underlying condition that is causing the insomnia. Treatment may also include: Medicines to help you sleep. Counseling or therapy. Lifestyle adjustments to help you sleep better. Follow these instructions at home: Eating and drinking  Limit or avoid alcohol , caffeinated beverages, and products that contain nicotine and tobacco, especially close to bedtime. These can disrupt your sleep. Do not eat a large meal or eat spicy foods right before bedtime. This can lead to digestive discomfort that can make it hard for you to sleep. Sleep habits  Keep a sleep diary to help you and your health care provider figure out what could be causing your insomnia. Write down: When you sleep. When you wake up during the night. How well you sleep and how rested you feel the next day. Any side effects of medicines you are taking. What you eat and drink. Make your bedroom a dark, comfortable place where it is easy to fall asleep. Put up shades or blackout curtains to block light from outside. Use a white noise machine to block noise. Keep the temperature cool. Limit screen use before bedtime. This includes: Not watching TV. Not using your smartphone, tablet, or computer. Stick to a routine that includes going to bed and waking up at the same times every day and night. This can help you fall asleep faster. Consider making a quiet activity, such as reading, part of your nighttime routine. Try to avoid taking naps during the day so that you sleep better at night. Get out of bed if you are still awake after  15 minutes of trying to sleep. Keep the lights down, but try reading or doing a quiet activity. When you feel  sleepy, go back to bed. General instructions Take over-the-counter and prescription medicines only as told by your health care provider. Exercise regularly as told by your health care provider. However, avoid exercising in the hours right before bedtime. Use relaxation techniques to manage stress. Ask your health care provider to suggest some techniques that may work well for you. These may include: Breathing exercises. Routines to release muscle tension. Visualizing peaceful scenes. Make sure that you drive carefully. Do not drive if you feel very sleepy. Keep all follow-up visits. This is important. Contact a health care provider if: You are tired throughout the day. You have trouble in your daily routine due to sleepiness. You continue to have sleep problems, or your sleep problems get worse. Get help right away if: You have thoughts about hurting yourself or someone else. Get help right away if you feel like you may hurt yourself or others, or have thoughts about taking your own life. Go to your nearest emergency room or: Call 911. Call the National Suicide Prevention Lifeline at 2232757840 or 988. This is open 24 hours a day. Text the Crisis Text Line at 657-529-4371. Summary Insomnia is a sleep disorder that makes it difficult to fall asleep or stay asleep. Insomnia can be long-term (chronic) or short-term (acute). Treatment for insomnia depends on the cause. Treatment may focus on treating an underlying condition that is causing the insomnia. Keep a sleep diary to help you and your health care provider figure out what could be causing your insomnia. This information is not intended to replace advice given to you by your health care provider. Make sure you discuss any questions you have with your health care provider. Document Revised: 03/22/2021 Document Reviewed: 03/22/2021 Elsevier Patient Education  2024 [employer].

## 2024-04-01 ENCOUNTER — Other Ambulatory Visit: Payer: Self-pay

## 2024-04-01 NOTE — Progress Notes (Signed)
 Specialty Pharmacy Refill Coordination Note  [name] [name] is a 63 y.o. female assessed today regarding refills of clinic administered specialty medication(s) Cabotegravir  & Rilpivirine  (CABENUVA )   Clinic requested Courier to Provider Office   Delivery date: 04/22/24   Verified address: [address] [place] [address]   Medication will be filled on 04/19/24.

## 2024-04-02 ENCOUNTER — Telehealth: Payer: Self-pay | Admitting: Student

## 2024-04-02 NOTE — Telephone Encounter (Signed)
 Patient is scheduled for a lab only visit on 12/29.

## 2024-04-02 NOTE — Telephone Encounter (Signed)
 Called patient regarding Chlorthalidone  refill and recent history of hypokalemia to 3.2 since 01/2024. [name] [name] was recently seen in the ID office, where this level was rechecked (11/10) and the result was the same. After the last CMP, [name] Fireman, NP reached out to patient and sent in liquid potassium supplementation (patient was unable to swallow regular K pills. She has not been taking K pills ordered by Cardiology).   I discussed with patient need to recheck levels and follow up for her hypertension management. Will discontinue the Chlorthalidone  for now until she is seen in clinic for BP and BMP recheck. If patient is not able to schedule an appointment in the next 2 weeks, she should be scheduled for a RN visit only and I will send a future BMP for a lab visit only.   Will check later this week to check the outcome of the scheduled appointement.

## 2024-04-05 ENCOUNTER — Other Ambulatory Visit (HOSPITAL_COMMUNITY): Payer: Self-pay

## 2024-04-08 ENCOUNTER — Other Ambulatory Visit: Payer: Self-pay

## 2024-04-08 NOTE — Progress Notes (Signed)
 Specialty Pharmacy Refill Coordination Note  [name] [name] is a 63 y.o. female assessed today regarding refills of clinic administered specialty medication(s) Tezepelumab -ekko (TEZSPIRE )   Clinic requested Courier to Provider Office   Delivery date: 04/11/24   Verified address: [facility] [address]   Medication will be filled on: 04/10/24

## 2024-04-09 ENCOUNTER — Encounter: Payer: Self-pay | Admitting: Physical Medicine & Rehabilitation

## 2024-04-09 ENCOUNTER — Encounter: Attending: Physical Medicine & Rehabilitation | Admitting: Physical Medicine & Rehabilitation

## 2024-04-09 VITALS — BP 128/83 | HR 81 | Ht 61.0 in | Wt 161.0 lb

## 2024-04-09 DIAGNOSIS — M533 Sacrococcygeal disorders, not elsewhere classified: Secondary | ICD-10-CM | POA: Diagnosis not present

## 2024-04-09 MED ORDER — BETAMETHASONE SOD PHOS & ACET 6 (3-3) MG/ML IJ SUSP
6.0000 mg | Freq: Once | INTRAMUSCULAR | Status: AC
  Administered 2024-04-09: 16:00:00 6 mg via INTRAMUSCULAR

## 2024-04-09 MED ORDER — IOHEXOL 180 MG/ML  SOLN
3.0000 mL | Freq: Once | INTRAMUSCULAR | Status: AC
  Administered 2024-04-09: 16:00:00 3 mL

## 2024-04-09 MED ORDER — LIDOCAINE HCL (PF) 2 % IJ SOLN
4.0000 mL | Freq: Once | INTRAMUSCULAR | Status: AC
  Administered 2024-04-09: 16:00:00 4 mL

## 2024-04-09 MED ORDER — LIDOCAINE HCL 1 % IJ SOLN
10.0000 mL | Freq: Once | INTRAMUSCULAR | Status: AC
  Administered 2024-04-09: 16:00:00 10 mL

## 2024-04-09 NOTE — Addendum Note (Signed)
 Addended by: [name] FLEMING T on: 04/09/2024 03:20 PM   Modules accepted: Orders

## 2024-04-09 NOTE — Progress Notes (Signed)
 Bilateral sacroiliac injections under fluoroscopic guidance  Indication: Low back and buttocks pain not relieved by medication management and other conservative care.Has had failure of PT and OTC meds.  Hx HIV, Last CD4 Sept 2025 , 655 Last injection 01/09/24   Informed consent was obtained after describing risks and benefits of the procedure with the patient, this includes bleeding, bruising, infection, paralysis and medication side effects. The patient wishes to proceed and has given written consent. The patient was placed in a prone position. The lumbar and sacral area was marked and prepped with Betadine . A 25-gauge 1-1/2 inch needle was inserted into the skin and subcutaneous tissue and 1 mL of 1% lidocaine  was injected into each side. Then a 25-gauge 3 inch spinal needle was inserted under fluoroscopic guidance into the left sacroiliac joint. AP and lateral images were utilized. Omnipaque  180x0.5 mL under live fluoroscopy demonstrated no intravascular uptake. Then a solution containing one ML of 6 mg per mL Celestone  in 2 ML of 2% lidocaine  MPF was injected x1.5 mL. This same procedure was repeated on the right side using the same needle, injectate, and technique. Patient tolerated the procedure well. Post procedure instructions were given. Please see post procedure form.

## 2024-04-09 NOTE — Progress Notes (Signed)
°  PROCEDURE RECORD [facility]   Name: [name] [name] DOB:1960/06/26 MRN: [id number]  Date:04/09/2024  Physician: Prentice Compton, MD    Nurse/CMA: Cara CMA  Allergies: Allergies[1]  Consent Signed: Yes.    Is patient diabetic? Yes.    CBG today? .  Pregnant: No. LMP: No LMP recorded. Patient is postmenopausal. (age 63-55)  Anticoagulants: no Anti-inflammatory: no Antibiotics: no  Procedure: bilateral sacroiliac steroid injection  Position: Prone Start Time: 10:36 am  End Time: 10:42 am  Fluoro Time: 22  RN/CMA Chee Kinslow RN [name],  CMA    Time 10:14 10:45 am    BP 128/83 159/83    Pulse 81 86    Respirations 16 16    O2 Sat 97 95    S/S 6 6    Pain Level 9/10 8/10     D/C home with self, [name] & O X 3, D/C instructions reviewed, and sits independently.           [1]  Allergies Allergen Reactions   Acetaminophen  Other (See Comments)    Inflamed liver, hospitalized  Other Reaction(s): liver demage   Morphine  Sulfate Hives and Shortness Of Breath   Triamterene Hives   Aspirin -Caffeine Diarrhea and Other (See Comments)    liver damage; upset stomach  Other Reaction(s): liver demage   Dyazide [Hydrochlorothiazide -Triamterene] Hives   Empagliflozin Diarrhea    (Jardiance) stomach ache   Metformin  Hcl Er Other (See Comments) and Nausea Only    upset stomach   Topamax  [Topiramate ] Other (See Comments)    Vision disturbances.   Whole Blood     Jehova's Witness    Citalopram  Itching, Rash and Other (See Comments)   Emtricitabine -Tenofovir  Df Rash    Descovy    Lisinopril  Other (See Comments)    Cough    Losartan  Nausea Only and Rash    Pt had rash, worsening dizziness, and nausea after starting losartan , which improved after stopping losartan    Triamterene-Hctz Rash

## 2024-04-09 NOTE — Addendum Note (Signed)
 Addended by: [name] FLEMING T on: 04/09/2024 03:56 PM   Modules accepted: Orders

## 2024-04-10 ENCOUNTER — Other Ambulatory Visit: Payer: Self-pay

## 2024-04-11 ENCOUNTER — Telehealth: Admitting: *Deleted

## 2024-04-11 NOTE — Patient Outreach (Signed)
 Complex Care Management   Visit Note  04/11/2024  Name:  [name] [name] MRN: [id number] DOB: June 04, 1960  Situation: Referral received for Complex Care Management related to HTN/DMII I obtained verbal consent from Patient.  Visit completed with Patient  on the phone  Background:   Past Medical History:  Diagnosis Date   Addison's disease (HCC)    Allergic rhinitis 05/09/2006   Allergy     Anxiety    Arthritis    Asthma    Blood dyscrasia    HIV   CAD (coronary artery disease) 03/19/2023   CCTA 03/16/23: CAC score 471 (98th percentile), RCA diffuse 25-49, dLM minimal Ca2+ plaque, LAD prox minimal plaques, pLCx < 24; TPV 542 mm3 (86th percentile)    CHF (congestive heart failure) (HCC)    Chronic back pain    Chronic night sweats 08/23/2022   Depression    Diabetes mellitus without complication (HCC) 04/25/2014   GERD (gastroesophageal reflux disease)    Heart murmur    as a child   Hepatitis C    genotype 1b, 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 s/p 12 week course of simeprevir and sofosbuvir between October 2014 and January 2015 with resolution.   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   Urticaria    Vaginal atrophy 10/20/2022    Assessment: Patient Reported Symptoms:  Cognitive Cognitive Status: Able to follow simple commands, Alert and oriented to person, place, and time, Normal speech and language skills Cognitive/Intellectual Conditions Management [RPT]: None reported or documented in medical history or problem list   Health Maintenance Behaviors: Annual physical exam Healing Pattern:  Average  Neurological Neurological Review of Symptoms: No symptoms reported Neurological Management Strategies: Medication therapy, Routine screening Neurological Self-Management Outcome: 4 (good) Neurological Comment: Hx of Headaches with prescribed medication with good relief.  HEENT HEENT Symptoms Reported: No symptoms reported HEENT Management Strategies: Routine screening, Coping strategies, Adequate rest HEENT Self-Management Outcome: 4 (good) HEENT Comment: Hearing aide in left ear. Ear problem(s)  Cardiovascular Cardiovascular Symptoms Reported: No symptoms reported Does patient have uncontrolled Hypertension?: Yes Is patient checking Blood Pressure at home?: Yes Patient's Recent BP reading at home: 04/09/24 last read was 128/83 HR 81 with ranges 128-130/80's with HR in the 80's and pt remains asymptomatic. Cardiovascular Management Strategies: Coping strategies, Adequate rest, Medication therapy, Routine screening Weight: 161 lb (73 kg) Cardiovascular Self-Management Outcome: 4 (good) Cardiovascular Comment: Pt reports she is aware when she has a regular HA and takes her ongoing prescribed medication vs elevated HTN with a headache  Respiratory Respiratory Symptoms Reported: No symptoms reported Other Respiratory Symptoms: Continues to have SOB upon exertion Additional Respiratory Details: Uses inhaler as prescribed for seasonal allergies when needed Respiratory Management Strategies: Adequate rest, Breathing techniques, Coping strategies, Medication therapy, Routine screening Respiratory Self-Management Outcome: 4 (good)  Endocrine Endocrine Symptoms Reported: No symptoms reported Is patient diabetic?: Yes Is patient checking blood sugars at home?: Yes List most recent blood sugar readings, include date and time of day: Fasting CBG 135 this morning with continues visit with  Dr. Faythe. Pt states she recently receive a steriod injection to her back resulting in an increase in her  glucose levels. Reports without the injection of steriod glucose levels are around 100. Pt remains asymptomatic. Endocrine Self-Management Outcome: 4 (good)  Gastrointestinal Gastrointestinal Symptoms Reported: No symptoms reported Gastrointestinal Management Strategies: Adequate rest, Medication therapy Gastrointestinal Self-Management Outcome: 4 (good)    Genitourinary Genitourinary Symptoms Reported: No symptoms reported Genitourinary Management Strategies: Coping strategies, Adequate rest Genitourinary Self-Management Outcome: 4 (good)  Integumentary Integumentary Symptoms Reported: No symptoms reported Skin Management Strategies: Routine screening, Adequate rest, Coping strategies Skin Self-Management Outcome: 4 (good)  Musculoskeletal Musculoskelatal Symptoms Reviewed: Weakness Additional Musculoskeletal Details: Continues to awaiting bone density testing Musculoskeletal Management Strategies: Routine screening, Medication therapy, Coping strategies, Adequate rest Musculoskeletal Self-Management Outcome: 4 (good)      Psychosocial Psychosocial Symptoms Reported: No symptoms reported Behavioral Management Strategies: Adequate rest, Coping strategies, Medication therapy, Support system Behavioral Health Self-Management Outcome: 4 (good) Major Change/Loss/Stressor/Fears (CP): Denies Quality of Family Relationships: involved, helpful, supportive Do you feel physically threatened by others?: No     Today's Vitals   04/11/24 1021  Weight: 161 lb (73 kg)   Pain Scale: 0-10 Pain Score: 7  Pain Type: Other (Comment) (Arthritic pain) Pain Location: Shoulder Pain Orientation: Right Pain Descriptors / Indicators: Sore Pain Onset: Gradual Patients Stated Pain Goal: 0 Pain Intervention(s): Medication (See eMAR), Repositioned  Medications Reviewed Today     Reviewed by Alvia Olam BIRCH, RN (Registered Nurse) on 04/11/24 at 1016  Med List Status: <None>   Medication Order Taking?  Sig Documenting Provider Last Dose Status Informant  ACCU-CHEK GUIDE test strip [id number] Yes use to check blood sugar In Vitro Once a day DX: E11.9 for 90 days [provider]  Active Self  amLODipine  (NORVASC ) 10 MG tablet [id number] Yes Take 1 tablet (10 mg total) by mouth daily. Gomez-Caraballo, Maria, MD  Active Self  aspirin  EC 81 MG tablet [id number] Yes Take 1 tablet (81 mg total) by mouth daily. Swallow whole. Addie Cordella Hamilton, MD  Active   atorvastatin  (LIPITOR) 10 MG tablet [id number] Yes Take 0.5 tablets (5 mg total) by mouth daily. Gomez-Caraballo, Maria, MD  Active Self  betamethasone  valerate ointment (VALISONE ) 0.1 % [id number] Yes Apply 1 Application topically 2 (two) times daily. Melvenia Corean SAILOR, NP  Active Self  cabotegravir  & rilpivirine  ER (CABENUVA ) 600 & 900 MG/3ML injection [id number] Yes Inject 1 kit into the muscle every 2 (two) months. Waddell Alan PARAS, RPH-CPP  Active   cetirizine  (ZYRTEC ) 10 MG tablet [id number] Yes Take 2 tablets (20 mg total) by mouth daily as needed for allergies. Cari Arlean HERO, FNP  Active   chlorthalidone  (HYGROTON ) 25 MG tablet [id number] Yes Take 1 tablet (25 mg total) by mouth daily. Tobie Gaines, DO  Active Self  cholecalciferol  (VITAMIN D3) 25 MCG (1000 UNIT) tablet [id number] Yes Take 3 tablets (3,000 Units total) by mouth daily. Nooruddin, Saad, MD  Active   ciprofloxacin -dexamethasone  (CIPRODEX ) OTIC suspension [id number] Yes Place 4 drops into the left ear as needed (ear infection). [provider]  Active Self  diazepam  (VALIUM ) 5 MG tablet [id number] Yes Take 1 by mouth 1 hour  pre-procedure with very light food. Do not drive motor vehicle. Eldonna Novel, MD  Active   docusate sodium  (COLACE) 100 MG capsule [id number]  Take 1 capsule (100 mg total) by mouth 2 (two) times daily.  Patient not taking: Reported on 04/11/2024   Addie Cordella Hamilton, MD  Active  ezetimibe  (ZETIA ) 10 MG tablet [id number] Yes Take 1 tablet (10 mg total)  by mouth daily. Gomez-Caraballo, Maria, MD  Active Self  famotidine  (PEPCID ) 40 MG tablet [id number] Yes Take 1 tablet (40 mg total) by mouth at bedtime. Elnora Ip, MD  Active Self  Fezolinetant  (VEOZAH ) 45 MG TABS [id number] Yes Take 1 tablet (45 mg total) by mouth daily. Melvenia Corean SAILOR, NP  Active   gabapentin  (NEURONTIN ) 400 MG capsule [id number] Yes TAKE 1 CAPSULE (400 MG TOTAL) BY MOUTH AT BEDTIME Ambs, Arlean HERO, FNP  Active   GLOBAL EASE INJECT PEN NEEDLES 32G X 4 MM MISC [id number] Yes USE AS DIRECTED WITH VICTOZA for 30 [provider]  Active Self  glucose blood test strip [id number] Yes Use as instructed Tobie Gaines, DO  Active Self  hydrocortisone  (CORTEF ) 5 MG tablet [id number] Yes Take 2.5-5 mg by mouth See admin instructions. Take 1 tablet (5 mg) by mouth in the morning (scheduled) & may taken an additional 0.5 tablet (2.5 mg) by mouth in the evening if needed. [provider]  Active Self           Med Note STEPHEN OLIVIA Kitchens Feb 27, 2023  3:23 PM)    ibuprofen  (ADVIL ) 800 MG tablet [id number] Yes Take 1 tablet (800 mg total) by mouth every 8 (eight) hours as needed. Magnant, Carlin CROME, PA-C  Active   liraglutide (VICTOZA) 18 MG/3ML SOPN [id number] Yes Inject 1.8 mg into the skin every evening. (1700) [provider]  Active Self  methocarbamol  (ROBAXIN ) 500 MG tablet [id number] Yes Take 1 tablet (500 mg total) by mouth every 6 (six) hours as needed for muscle spasms. Magnant, Carlin CROME, PA-C  Active   metoprolol  tartrate (LOPRESSOR ) 50 MG tablet [id number] Yes Take 1 tablet (50 mg total) by mouth 2 (two) times daily. Gomez-Caraballo, Maria, MD  Active Self  montelukast  (SINGULAIR ) 10 MG tablet [id number] Yes Take 1 tablet (10 mg total) by mouth at bedtime. Kozlow, Eric J, MD  Active Self  NEURONTIN  100 MG capsule [id number] Yes TAKE TWO CAPSULES (200 MG TOTAL) BY MOUTH TWO (TWO) TIMES DAILY. TAKE 1-2 CAPSULES EVERY 3-4 TIMES DAILY. Kozlow, Camellia PARAS, MD   Active   nitroGLYCERIN  (NITROSTAT ) 0.4 MG SL tablet [id number] Yes Place 1 tablet (0.4 mg total) under the tongue every 5 (five) minutes as needed for chest pain. Lou Claretta HERO, MD  Active Self  nortriptyline  (PAMELOR ) 10 MG capsule [id number] Yes TAKE TWO CAPSULES BY MOUTH AT BEDTIME Onita Duos, MD  Active Self  ondansetron  (ZOFRAN ) 4 MG tablet [id number] Yes Take 1 tablet (4 mg total) by mouth every 8 (eight) hours as needed for nausea or vomiting. King, Olivia, DO  Active   pantoprazole  (PROTONIX ) 40 MG tablet [id number] Yes Take 1 tablet (40 mg total) by mouth 2 (two) times daily. Elnora Ip, MD  Active Self  potassium chloride  20 MEQ/15ML (10%) SOLN [id number] Yes Take 30 mLs (40 mEq total) by mouth 2 (two) times daily. Melvenia Corean SAILOR, NP  Active   psyllium (HYDROCIL/METAMUCIL) 95 % PACK [id number] Yes Take 1 packet by mouth daily. [provider]  Active Self  sertraline  (ZOLOFT ) 100 MG tablet [id number] Yes Take 1 tablet (100 mg total) by mouth daily. Gomez-Caraballo, Maria, MD  Active Self  sodium chloride  (OCEAN) 0.65 % SOLN nasal spray [id number] Yes PLACE 1 SPRAY INTO BOTH NOSTRILS AS NEEDED FOR CONGESTION. Rosan Harlene Fickle, DO  Active Self  SUMAtriptan  (IMITREX ) 25 MG tablet  [id number] Yes Take 1 tablet (25 mg total) by mouth every 2 (two) hours as needed for migraine. May repeat in 2 hours if headache persists or recurs. Onita Duos, MD  Active Self  SYMBICORT  160-4.5 MCG/ACT inhaler [id number] Yes Inhale 2 puffs into the lungs in the morning and at bedtime. Kozlow, Eric J, MD  Active Self  Tezepelumab -ekko (TEZSPIRE ) 210 MG/1. SOAJ [id number] Yes Inject 210 mg into the skin every 28 (twenty-eight) days. Kozlow, Camellia PARAS, MD  Active Self  tezepelumab -ekko (TEZSPIRE ) 210 MG/1. syringe 210 mg [id number]   Kozlow, Eric J, MD  Active   traMADol  (ULTRAM ) 50 MG tablet [id number]  Take 1 tablet (50 mg total) by mouth every 6 (six) hours as needed.  Patient not  taking: Reported on 04/11/2024   Magnant, Carlin CROME, PA-C  Active   triamcinolone  (NASACORT ) 55 MCG/ACT AERO nasal inhaler [id number] Yes Place 1 spray into the nose 2 (two) times daily. 1 spray each nostril 2 times per day Maurilio Camellia PARAS, MD  Active Self  VENTOLIN  HFA 108 (90 Base) MCG/ACT inhaler [id number] Yes INHALE TWO PUFFS INTO THE LUNGS EVERY 4 (FOUR) HOURS AS NEEDED FOR WHEEZING OR SHORTNESS OF BREATH. Kozlow, Camellia PARAS, MD  Active Self            Recommendation:   PCP Follow-up Continue Current Plan of Care  Follow Up Plan:   Telephone follow up appointment date/time:  05/09/2024 @ 11:00 am   Olam Ku, RN, BSN [place]  [facility], [facility] RN Care Manager Direct Dial: [phone]  Fax: [phone]

## 2024-04-11 NOTE — Patient Instructions (Signed)
 Visit Information  [name] [name] was given information about Medicaid Managed Care team care coordination services as a part of their [facility] Medicaid benefit.   If you would like to schedule transportation through your [facility], please call the following number at least 2 days in advance of your appointment: [phone]   Rides for urgent appointments can also be made after hours by calling Member Services.  Call the Behavioral Health Crisis Line at [phone], at any time, 24 hours a day, 7 days a week. If you are in danger or need immediate medical attention call 911.  Please see education materials related to HTN provided by MyChart link.  Care plan and visit instructions communicated with the patient verbally today. Patient agrees to receive a copy in MyChart. Active MyChart status and patient understanding of how to access instructions and care plan via MyChart confirmed with patient.     Telephone follow up appointment with Managed Medicaid care management team member scheduled for:05/09/2024 @ 11:00 am   Olam Ku, RN, BSN [place]  [facility], [facility] RN Care Manager Direct Dial: [phone]  Fax: [phone]    Following is a copy of your plan of care:   Goals Addressed             This Visit's Progress    VBCI RN Care Plan-HTN   On track    Problems:  Chronic Disease Management support and education needs related to HTN  Goal: Over the next 60 days the Patient will attend all scheduled medical appointments: with PCP and specialist as evidenced by keeping al scheduled appointments        demonstrate Ongoing adherence to prescribed treatment plan for HTN as evidenced by no admissions to the hospital verbalize basic understanding of HTN disease process and self health management plan as evidenced by verbal explanation lifestyle changes and consistent medication  compliance   Interventions:   Hypertension Interventions: Last practice recorded BP readings:  BP Readings from Last 3 Encounters:  04/09/24 128/83  03/28/24 132/84  03/18/24 129/77  Pt reports her bp on the morning of 04/09/2024 at 128/83 and she remains asymptomatic with no reported issues. Most recent eGFR/CrCl:  Lab Results  Component Value Date   EGFR 52 (L) 02/09/2024    No components found for: CRCL  Evaluation of current treatment plan related to hypertension self management and patient's adherence to plan as established by provider Reviewed medications with patient and discussed importance of compliance Reiterate on the discussed plans with patient for ongoing care management follow up and provided patient with direct contact information for care management team Advised patient, providing education and rationale, to monitor blood pressure daily and record, calling PCP for findings outside established parameters if necessary.  Patient able to recite her parameters 130/80 and aware of symptoms related to HTN and what to do if acute. Reviewed scheduled/upcoming provider appointments including: noted in EPIC as patient able to review via her MyChart Continue to explore pt's awareness on complications of poorly controlled blood pressure such as heart disease, stroke, circulatory complications, vision complications, kidney impairment, sexual dysfunction Assessed social determinant of health barrier Continue to check BP weekly as reported by patient  and write the values for providers to view  Patient Self-Care Activities:  Attend all scheduled provider appointments Call pharmacy for medication refills 3-7 days in advance of running out of medications Call provider office for new concerns or questions  Take medications as  prescribed   check blood pressure weekly write blood pressure results in a log or diary keep a blood pressure log take blood pressure log to all doctor  appointments call doctor for signs and symptoms of high blood pressure keep all doctor appointments take medications for blood pressure exactly as prescribed report new symptoms to your doctor limit salt intake and consume health dietary measures  Plan:  Telephone follow up appointment with care management team member scheduled for:  05/09/2024 @ 11:00 am

## 2024-04-12 ENCOUNTER — Inpatient Hospital Stay: Admission: RE | Admit: 2024-04-12 | Discharge: 2024-04-12 | Attending: Internal Medicine

## 2024-04-12 DIAGNOSIS — Z1231 Encounter for screening mammogram for malignant neoplasm of breast: Secondary | ICD-10-CM

## 2024-04-15 ENCOUNTER — Ambulatory Visit

## 2024-04-15 DIAGNOSIS — J4550 Severe persistent asthma, uncomplicated: Secondary | ICD-10-CM

## 2024-04-19 ENCOUNTER — Other Ambulatory Visit: Payer: Self-pay

## 2024-04-22 ENCOUNTER — Other Ambulatory Visit: Payer: Self-pay | Admitting: Student

## 2024-04-22 ENCOUNTER — Telehealth: Payer: Self-pay

## 2024-04-22 ENCOUNTER — Other Ambulatory Visit: Payer: Self-pay | Admitting: Allergy and Immunology

## 2024-04-22 ENCOUNTER — Other Ambulatory Visit: Payer: Self-pay

## 2024-04-22 DIAGNOSIS — I10 Essential (primary) hypertension: Secondary | ICD-10-CM

## 2024-04-22 DIAGNOSIS — J4550 Severe persistent asthma, uncomplicated: Secondary | ICD-10-CM

## 2024-04-22 NOTE — Telephone Encounter (Signed)
 [facility] Patient Advocate Encounter  Patient's medications CABENUVA  have been couriered to [facility] from [facility] and will be administered at the patients appointment on 04/29/24.  Charmaine Sharps, CPhT Specialty Pharmacy Patient [facility] for Infectious Disease Phone: [phone] Fax:  [phone]

## 2024-04-22 NOTE — Progress Notes (Signed)
 Patient presents for repeat BMP and BP follow up. See telephone encounter on 12/9 w/ Dr. Elnora.  Patient has scheduled OV but patient adamantly declines office visit and request only lab visit. -RN BP visit today -Future BMP order placed

## 2024-04-22 NOTE — Progress Notes (Deleted)
 "  CC: ***  HPI: Ms.Hajra L Breeland is a 63 y.o. female living with a history stated below and presents today for ***. Please see problem based assessment and plan for additional details.  Past Medical History:  Diagnosis Date   Addison's disease (HCC)    Allergic rhinitis 05/09/2006   Allergy     Anxiety    Arthritis    Asthma    Blood dyscrasia    HIV   CAD (coronary artery disease) 03/19/2023   CCTA 03/16/23: CAC score 471 (98th percentile), RCA diffuse 25-49, dLM minimal Ca2+ plaque, LAD prox minimal plaques, pLCx < 24; TPV 542 mm3 (86th percentile)    CHF (congestive heart failure) (HCC)    Chronic back pain    Chronic night sweats 08/23/2022   Depression    Diabetes mellitus without complication (HCC) 04/25/2014   GERD (gastroesophageal reflux disease)    Heart murmur    as a child   Hepatitis C    genotype 1b, 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 s/p 12 week course of simeprevir and sofosbuvir between October 2014 and January 2015 with resolution.   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   Urticaria    Vaginal atrophy 10/20/2022    Medications Ordered Prior to Encounter[1]  Family History  Problem Relation Age of Onset   Heart disease Mother    Diabetes Mother    Stroke Mother    Heart disease Father    Stroke Father    Diabetes Father    Hepatitis Sister        hcv   Asthma Sister    Allergic rhinitis Sister    Stroke Other    Colon polyps Brother    Renal Disease Brother    Cancer Sister        lung   Asthma Sister    Allergic rhinitis Sister    Cancer Maternal Aunt     Cancer Maternal Aunt    Colon cancer Neg Hx    Esophageal cancer Neg Hx    Stomach cancer Neg Hx    Rectal cancer Neg Hx    Angioedema Neg Hx    Eczema Neg Hx    Urticaria Neg Hx     Social History   Socioeconomic History   Marital status: Single    Spouse name: Not on file   Number of children: 0   Years of education: 12   Highest education level: 12th grade  Occupational History   Occupation: Disabled  Tobacco Use   Smoking status: Former    Current packs/day: 0.00    Types: Cigarettes    Start date: 04/26/1987    Quit date: 04/26/2007    Years since quitting: 17.0   Smokeless tobacco: Never   Tobacco comments:    QUIT 2009  Vaping Use   Vaping status: Never Used  Substance and Sexual Activity   Alcohol use: No    Alcohol/week: 0.0 standard drinks of alcohol   Drug use: Yes    Types: Marijuana   Sexual  activity: Not Currently    Partners: Male    Birth control/protection: Condom    Comment: declined condoms  Other Topics Concern   Not on file  Social History Narrative   Lives at home alone.   Right-handed.   Occasional tea or soda.   Te   Social Drivers of Health   Tobacco Use: Medium Risk (04/09/2024)   Patient History    Smoking Tobacco Use: Former    Smokeless Tobacco Use: Never    Passive Exposure: Not on file  Financial Resource Strain: Low Risk (08/14/2023)   Overall Financial Resource Strain (CARDIA)    Difficulty of Paying Living Expenses: Not very hard  Food Insecurity: No Food Insecurity (04/11/2024)   Epic    Worried About Programme Researcher, Broadcasting/film/video in the Last Year: Never true    Ran Out of Food in the Last Year: Never true  Transportation Needs: No Transportation Needs (04/11/2024)   Epic    Lack of Transportation (Medical): No    Lack of Transportation (Non-Medical): No  Physical Activity: Unknown (08/14/2023)   Exercise Vital Sign    Days of Exercise per Week: 2 days    Minutes of Exercise per Session: Patient declined  Stress: Stress Concern  Present (08/14/2023)   [employer] of Occupational Health - Occupational Stress Questionnaire    Feeling of Stress : Rather much  Social Connections: Moderately Integrated (08/14/2023)   Social Connection and Isolation Panel    Frequency of Communication with Friends and Family: More than three times a week    Frequency of Social Gatherings with Friends and Family: Never    Attends Religious Services: More than 4 times per year    Active Member of Clubs or Organizations: Yes    Attends Banker Meetings: More than 4 times per year    Marital Status: Never married  Intimate Partner Violence: Not At Risk (04/11/2024)   Epic    Fear of Current or Ex-Partner: No    Emotionally Abused: No    Physically Abused: No    Sexually Abused: No  Depression (PHQ2-9): Low Risk (03/04/2024)   Depression (PHQ2-9)    PHQ-2 Score: 4  Alcohol Screen: Low Risk (08/14/2023)   Alcohol Screen    Last Alcohol Screening Score (AUDIT): 0  Housing: Low Risk (04/11/2024)   Epic    Unable to Pay for Housing in the Last Year: No    Number of Times Moved in the Last Year: 0    Homeless in the Last Year: No  Utilities: Not At Risk (04/11/2024)   Epic    Threatened with loss of utilities: No  Health Literacy: Adequate Health Literacy (10/09/2023)   B1300 Health Literacy    Frequency of need for help with medical instructions: Never    Review of Systems: ROS negative except for what is noted on the assessment and plan.  There were no vitals filed for this visit.  Physical Exam  Physical Exam: Constitutional: well-appearing *** sitting in ***, in no acute distress HENT: normocephalic atraumatic, mucous membranes moist Eyes: conjunctiva non-erythematous Cardiovascular: regular rate and rhythm, no m/r/g Pulmonary/Chest: normal work of breathing on room air, lungs clear to auscultation bilaterally Abdominal: soft, non-tender, non-distended MSK: *** Neurological: alert & oriented x 3, 5/5  strength in bilateral upper and lower extremities, normal gait Skin: warm and dry Psych: ***  Assessment & Plan:   Assessment & Plan     No orders of the defined types were placed in  this encounter.  PMH: HTN, chronic migraines, CAD, asthma, LPRD, liver fibrosis, type 2 diabetes, secondary adrenal insufficiency, pituitary micro adenoma, Addison's disease, HIV  Hypokalemia Recent history of hypokalemia, symptomatic Taking chlorthalidone  which was discontinued during phone encounter on 12/9 Unable to take potassium pills Prescribed liquid potassium 40 mEq twice daily   HTN Status: {statusupdate:[id number]}. BP today ***. Reports taking chlorthalidone  25 mg, amlodipine  10 mg, metoprolol  50 mg twice daily, ***, ***.  Last BMP in *** with ***. *** symptoms. *** home BP monitoring.   Plan -Continue: *** -BMP ***     No follow-ups on file.   Patient {GC/GE:[id number]::discussed with,seen with} Dr. {[id number]::Tpoopjfd,Z. Hoffman,Winfrey,Narendra,Chun,Chambliss,Lau,Machen}  Ozell Nearing, D.O. [facility] Internal Medicine, PGY-3 Clinic Phone: [phone] Date 04/22/2024 Time 7:48 AM    [1]  Current Outpatient Medications on File Prior to Visit  Medication Sig Dispense Refill   ACCU-CHEK GUIDE test strip use to check blood sugar In Vitro Once a day DX: E11.9 for 90 days     amLODipine  (NORVASC ) 10 MG tablet Take 1 tablet (10 mg total) by mouth daily. 90 tablet 3   aspirin  EC 81 MG tablet Take 1 tablet (81 mg total) by mouth daily. Swallow whole. 30 tablet 12   atorvastatin  (LIPITOR) 10 MG tablet Take 0.5 tablets (5 mg total) by mouth daily. 45 each 10   betamethasone  valerate ointment (VALISONE ) 0.1 % Apply 1 Application topically 2 (two) times daily. 30 g 0   cabotegravir  & rilpivirine  ER (CABENUVA ) 600 & 900 MG/3ML injection Inject 1 kit into the muscle every 2 (two) months. 6 mL 5   cetirizine  (ZYRTEC ) 10 MG tablet Take 2 tablets (20 mg total) by mouth  daily as needed for allergies. 180 tablet 1   chlorthalidone  (HYGROTON ) 25 MG tablet Take 1 tablet (25 mg total) by mouth daily. 90 tablet 1   cholecalciferol  (VITAMIN D3) 25 MCG (1000 UNIT) tablet Take 3 tablets (3,000 Units total) by mouth daily. 270 tablet 3   ciprofloxacin -dexamethasone  (CIPRODEX ) OTIC suspension Place 4 drops into the left ear as needed (ear infection).     diazepam  (VALIUM ) 5 MG tablet Take 1 by mouth 1 hour  pre-procedure with very light food. Do not drive motor vehicle. 1 tablet 0   docusate sodium  (COLACE) 100 MG capsule Take 1 capsule (100 mg total) by mouth 2 (two) times daily. (Patient not taking: Reported on 04/11/2024) 10 capsule 0   ezetimibe  (ZETIA ) 10 MG tablet Take 1 tablet (10 mg total) by mouth daily. 90 tablet 3   famotidine  (PEPCID ) 40 MG tablet Take 1 tablet (40 mg total) by mouth at bedtime. 90 tablet 3   Fezolinetant  (VEOZAH ) 45 MG TABS Take 1 tablet (45 mg total) by mouth daily. 30 tablet 4   gabapentin  (NEURONTIN ) 400 MG capsule TAKE 1 CAPSULE (400 MG TOTAL) BY MOUTH AT BEDTIME 90 capsule 0   GLOBAL EASE INJECT PEN NEEDLES 32G X 4 MM MISC USE AS DIRECTED WITH VICTOZA for 30     glucose blood test strip Use as instructed 100 each 12   hydrocortisone  (CORTEF ) 5 MG tablet Take 2.5-5 mg by mouth See admin instructions. Take 1 tablet (5 mg) by mouth in the morning (scheduled) & may taken an additional 0.5 tablet (2.5 mg) by mouth in the evening if needed.     ibuprofen  (ADVIL ) 800 MG tablet Take 1 tablet (800 mg total) by mouth every 8 (eight) hours as needed. 60 tablet 1   liraglutide (VICTOZA) 18 MG/3ML SOPN  Inject 1.8 mg into the skin every evening. (1700)     methocarbamol  (ROBAXIN ) 500 MG tablet Take 1 tablet (500 mg total) by mouth every 6 (six) hours as needed for muscle spasms. 30 tablet 0   metoprolol  tartrate (LOPRESSOR ) 50 MG tablet Take 1 tablet (50 mg total) by mouth 2 (two) times daily. 180 tablet 3   montelukast  (SINGULAIR ) 10 MG tablet Take 1  tablet (10 mg total) by mouth at bedtime. 90 tablet 1   NEURONTIN  100 MG capsule TAKE TWO CAPSULES (200 MG TOTAL) BY MOUTH TWO (TWO) TIMES DAILY. TAKE 1-2 CAPSULES EVERY 3-4 TIMES DAILY. 360 capsule 1   nitroGLYCERIN  (NITROSTAT ) 0.4 MG SL tablet Place 1 tablet (0.4 mg total) under the tongue every 5 (five) minutes as needed for chest pain. 25 tablet 1   nortriptyline  (PAMELOR ) 10 MG capsule TAKE TWO CAPSULES BY MOUTH AT BEDTIME 180 capsule 10   ondansetron  (ZOFRAN ) 4 MG tablet Take 1 tablet (4 mg total) by mouth every 8 (eight) hours as needed for nausea or vomiting. 20 tablet 0   pantoprazole  (PROTONIX ) 40 MG tablet Take 1 tablet (40 mg total) by mouth 2 (two) times daily. 180 tablet 3   potassium chloride  20 MEQ/15ML (10%) SOLN Take 30 mLs (40 mEq total) by mouth 2 (two) times daily. 1800 mL 11   psyllium (HYDROCIL/METAMUCIL) 95 % PACK Take 1 packet by mouth daily.     sertraline  (ZOLOFT ) 100 MG tablet Take 1 tablet (100 mg total) by mouth daily. 90 tablet 1   sodium chloride  (OCEAN) 0.65 % SOLN nasal spray PLACE 1 SPRAY INTO BOTH NOSTRILS AS NEEDED FOR CONGESTION. 3 Bottle 0   SUMAtriptan  (IMITREX ) 25 MG tablet Take 1 tablet (25 mg total) by mouth every 2 (two) hours as needed for migraine. May repeat in 2 hours if headache persists or recurs. 12 tablet 6   SYMBICORT  160-4.5 MCG/ACT inhaler Inhale 2 puffs into the lungs in the morning and at bedtime. 30.6 g 1   Tezepelumab -ekko (TEZSPIRE ) 210 MG/1. SOAJ Inject 210 mg into the skin every 28 (twenty-eight) days. 1.91 mL 11   traMADol  (ULTRAM ) 50 MG tablet Take 1 tablet (50 mg total) by mouth every 6 (six) hours as needed. (Patient not taking: Reported on 04/11/2024) 30 tablet 0   triamcinolone  (NASACORT ) 55 MCG/ACT AERO nasal inhaler Place 1 spray into the nose 2 (two) times daily. 1 spray each nostril 2 times per day 50.7 g 1   VENTOLIN  HFA 108 (90 Base) MCG/ACT inhaler INHALE TWO PUFFS INTO THE LUNGS EVERY 4 (FOUR) HOURS AS NEEDED FOR WHEEZING  OR SHORTNESS OF BREATH. 18 g 0   Current Facility-Administered Medications on File Prior to Visit  Medication Dose Route Frequency Provider Last Rate Last Admin   tezepelumab -ekko (TEZSPIRE ) 210 MG/1. syringe 210 mg  210 mg Subcutaneous Q28 days Kozlow, Eric J, MD   210 mg at 04/15/24 1402   "

## 2024-04-22 NOTE — Progress Notes (Signed)
" ° ° °  [name] [name] Bolus presented today for blood pressure check. Patient is prescribed blood pressure medications and I confirmed that patient did take their blood pressure medication prior to todays appointment. Blood pressure was taken in the usual and appropriate manner using an automated BP cuff.     Vitals:   04/22/24 1004  BP: 136/83      Results of todays visit will be routed to Dr. Elicia for review and further management.     "

## 2024-04-23 LAB — BASIC METABOLIC PANEL WITH GFR
BUN/Creatinine Ratio: 16 (ref 12–28)
BUN: 15 mg/dL (ref 8–27)
CO2: 22 mmol/L (ref 20–29)
Calcium: 9 mg/dL (ref 8.7–10.3)
Chloride: 101 mmol/L (ref 96–106)
Creatinine, Ser: 0.92 mg/dL (ref 0.57–1.00)
Glucose: 222 mg/dL — ABNORMAL HIGH (ref 70–99)
Potassium: 3.5 mmol/L (ref 3.5–5.2)
Sodium: 140 mmol/L (ref 134–144)
eGFR: 70 mL/min/1.73

## 2024-04-24 ENCOUNTER — Ambulatory Visit: Payer: Self-pay | Admitting: Student

## 2024-04-24 ENCOUNTER — Other Ambulatory Visit (HOSPITAL_COMMUNITY): Payer: Self-pay

## 2024-04-26 NOTE — Progress Notes (Signed)
 "  HPI: [name] [name] is a 64 y.o. female who presents to the [facility] pharmacy clinic for Cabenuva  administration.  Referring ID Provider: Corean Fireman   Patient Active Problem List   Diagnosis Date Noted   Non-restorative sleep 03/28/2024   Changing skin lesion 03/18/2024   Severe persistent asthma without complication (HCC) 02/22/2024   Pruritic disorder 02/22/2024   Arthritis of left shoulder 11/19/2023   S/P reverse total shoulder arthroplasty, 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   Osteoporosis 10/24/2023   CAD (coronary artery disease) 03/19/2023   Chest pain 01/30/2023   Type 2 diabetes mellitus (HCC) 01/30/2023   Osteopenia after menopause 03/15/2022   Anxiety 03/15/2022   Impingement syndrome of left shoulder 07/01/2021   Osteoarthritis of thumb, right 11/29/2018   Chronic migraine w/o aura w/o status migrainosus, not intractable 08/27/2018   Central perforation of tympanic membrane of left ear 12/09/2016   Eustachian tube dysfunction, left 03/09/2016   Spondylosis of lumbar region without myelopathy or radiculopathy 10/08/2015   Liver fibrosis (HCC) 12/04/2014   Addison's disease (HCC) 10/23/2014   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   Vitamin D  deficiency 06/29/2014   Secondary adrenal insufficiency 06/29/2014   History of tympanostomy tube placement 02/07/2014   Hepatitis C virus infection cured after antiviral drug therapy 03/16/2012   Health care maintenance 12/15/2011   HTN (hypertension) 10/05/2010   Hyperlipidemia associated with type 2 diabetes mellitus (HCC) 10/23/2008   Insomnia 02/14/2007   HIV disease (HCC) 05/09/2006   Allergic rhinitis 05/09/2006   LPRD (laryngopharyngeal reflux disease) 05/09/2006    Patient's Medications  New Prescriptions   No medications on file  Previous Medications   ACCU-CHEK GUIDE TEST STRIP    use to check blood sugar In Vitro Once a day DX: E11.9 for 90 days   AMLODIPINE  (NORVASC ) 10 MG  TABLET    Take 1 tablet (10 mg total) by mouth daily.   ASPIRIN  EC 81 MG TABLET    Take 1 tablet (81 mg total) by mouth daily. Swallow whole.   ATORVASTATIN  (LIPITOR) 10 MG TABLET    Take 0.5 tablets (5 mg total) by mouth daily.   BETAMETHASONE  VALERATE OINTMENT (VALISONE ) 0.1 %    Apply 1 Application topically 2 (two) times daily.   CABOTEGRAVIR  & RILPIVIRINE  ER (CABENUVA ) 600 & 900 MG/3ML INJECTION    Inject 1 kit into the muscle every 2 (two) months.   CETIRIZINE  (ZYRTEC ) 10 MG TABLET    Take 2 tablets (20 mg total) by mouth daily as needed for allergies.   CHLORTHALIDONE  (HYGROTON ) 25 MG TABLET    Take 1 tablet (25 mg total) by mouth daily.   CHOLECALCIFEROL  (VITAMIN D3) 25 MCG (1000 UNIT) TABLET    Take 3 tablets (3,000 Units total) by mouth daily.   CIPROFLOXACIN -DEXAMETHASONE  (CIPRODEX ) OTIC SUSPENSION    Place 4 drops into the left ear as needed (ear infection).   DIAZEPAM  (VALIUM ) 5 MG TABLET    Take 1 by mouth 1 hour  pre-procedure with very light food. Do not drive motor vehicle.   DOCUSATE SODIUM  (COLACE) 100 MG CAPSULE    Take 1 capsule (100 mg total) by mouth 2 (two) times daily.   EZETIMIBE  (ZETIA ) 10 MG TABLET    Take 1 tablet (10 mg total) by mouth daily.   FAMOTIDINE  (PEPCID ) 40 MG TABLET    Take 1 tablet (40 mg total) by mouth at bedtime.   FEZOLINETANT  (VEOZAH ) 45 MG TABS    Take 1 tablet (  45 mg total) by mouth daily.   GABAPENTIN  (NEURONTIN ) 400 MG CAPSULE    TAKE 1 CAPSULE (400 MG TOTAL) BY MOUTH AT BEDTIME   GLOBAL EASE INJECT PEN NEEDLES 32G X 4 MM MISC    USE AS DIRECTED WITH VICTOZA for 30   GLUCOSE BLOOD TEST STRIP    Use as instructed   HYDROCORTISONE  (CORTEF ) 5 MG TABLET    Take 2.5-5 mg by mouth See admin instructions. Take 1 tablet (5 mg) by mouth in the morning (scheduled) & may taken an additional 0.5 tablet (2.5 mg) by mouth in the evening if needed.   IBUPROFEN  (ADVIL ) 800 MG TABLET    Take 1 tablet (800 mg total) by mouth every 8 (eight) hours as needed.    LIRAGLUTIDE (VICTOZA) 18 MG/3ML SOPN    Inject 1.8 mg into the skin every evening. (1700)   METHOCARBAMOL  (ROBAXIN ) 500 MG TABLET    Take 1 tablet (500 mg total) by mouth every 6 (six) hours as needed for muscle spasms.   METOPROLOL  TARTRATE (LOPRESSOR ) 50 MG TABLET    Take 1 tablet (50 mg total) by mouth 2 (two) times daily.   MONTELUKAST  (SINGULAIR ) 10 MG TABLET    TAKE 1 TABLET (10 MG TOTAL) BY MOUTH AT BEDTIME.   NEURONTIN  100 MG CAPSULE    TAKE TWO CAPSULES (200 MG TOTAL) BY MOUTH TWO (TWO) TIMES DAILY. TAKE 1-2 CAPSULES EVERY 3-4 TIMES DAILY.   NITROGLYCERIN  (NITROSTAT ) 0.4 MG SL TABLET    Place 1 tablet (0.4 mg total) under the tongue every 5 (five) minutes as needed for chest pain.   NORTRIPTYLINE  (PAMELOR ) 10 MG CAPSULE    TAKE TWO CAPSULES BY MOUTH AT BEDTIME   ONDANSETRON  (ZOFRAN ) 4 MG TABLET    Take 1 tablet (4 mg total) by mouth every 8 (eight) hours as needed for nausea or vomiting.   PANTOPRAZOLE  (PROTONIX ) 40 MG TABLET    Take 1 tablet (40 mg total) by mouth 2 (two) times daily.   POTASSIUM CHLORIDE  20 MEQ/15ML (10%) SOLN    Take 30 mLs (40 mEq total) by mouth 2 (two) times daily.   PSYLLIUM (HYDROCIL/METAMUCIL) 95 % PACK    Take 1 packet by mouth daily.   SERTRALINE  (ZOLOFT ) 100 MG TABLET    Take 1 tablet (100 mg total) by mouth daily.   SODIUM CHLORIDE  (OCEAN) 0.65 % SOLN NASAL SPRAY    PLACE 1 SPRAY INTO BOTH NOSTRILS AS NEEDED FOR CONGESTION.   SUMATRIPTAN  (IMITREX ) 25 MG TABLET    Take 1 tablet (25 mg total) by mouth every 2 (two) hours as needed for migraine. May repeat in 2 hours if headache persists or recurs.   SYMBICORT  160-4.5 MCG/ACT INHALER    INHALE TWO PUFFS INTO THE LUNGS IN THE MORNING AND AT BEDTIME.   TEZEPELUMAB -EKKO (TEZSPIRE ) 210 MG/1. SOAJ    Inject 210 mg into the skin every 28 (twenty-eight) days.   TRAMADOL  (ULTRAM ) 50 MG TABLET    Take 1 tablet (50 mg total) by mouth every 6 (six) hours as needed.   TRIAMCINOLONE  (NASACORT ) 55 MCG/ACT AERO NASAL INHALER     Place 1 spray into the nose 2 (two) times daily. 1 spray each nostril 2 times per day   VENTOLIN  HFA 108 (90 BASE) MCG/ACT INHALER    INHALE TWO PUFFS INTO THE LUNGS EVERY 4 (FOUR) HOURS AS NEEDED FOR WHEEZING OR SHORTNESS OF BREATH.  Modified Medications   No medications on file  Discontinued Medications  No medications on file    Allergies: Allergies[1]  Labs: Lab Results  Component Value Date   HIV1RNAQUANT NOT DETECTED 03/04/2024   HIV1RNAQUANT 21 (H) 08/24/2023   HIV1RNAQUANT Not Detected 04/25/2023   CD4TABS 655 03/04/2024   CD4TABS 518 08/24/2023   CD4TABS 516 03/06/2023    RPR and STI Lab Results  Component Value Date   LABRPR NON-REACTIVE 02/02/2021   LABRPR NON-REACTIVE 11/13/2019   LABRPR NON-REACTIVE 08/30/2018   LABRPR NON-REACTIVE 11/16/2017   LABRPR NON REAC 07/12/2016    STI Results GC CT  10/20/2022 10:51 AM Negative  Negative   02/02/2021 10:13 AM Negative  Negative   08/30/2018 12:00 AM Negative  Negative   01/11/2018 12:00 AM Negative  Negative   11/16/2017 12:00 AM Negative  Negative   03/25/2011  4:35 PM  NEGATIVE     Hepatitis B Lab Results  Component Value Date   HEPBSAB NEG 12/15/2011   HEPBSAG NEGATIVE 12/15/2011   Hepatitis C Lab Results  Component Value Date   HCVRNAPCRQN <15 NOT DETECTED 02/21/2017   Hepatitis A No results found for: HAV Lipids: Lab Results  Component Value Date   CHOL 153 08/14/2023   TRIG 206 (H) 08/14/2023   HDL 42 08/14/2023   CHOLHDL 3.6 08/14/2023   VLDL 44 (H) 07/12/2016   LDLCALC 77 08/14/2023    Target Date: The 6th  Assessment: [name] [name] presents today for her maintenance Cabenuva  injections. Past injections were tolerated well without issues. Last HIV RNA was not detected in November. Doing well with no issues today.  Lab work:  None today  Eligible vaccinations:  Currently up to date on all recommended vaccines.   Cabenuva : Administered cabotegravir  600mg /49mL in left upper outer  quadrant of the gluteal muscle. Administered rilpivirine  900 mg/3mL in the right upper outer quadrant of the gluteal muscle. No issues with injections. She will follow up in 2 months for next set of injections.  Plan: - Cabenuva  injections administered - Next injections scheduled for 06/25/24 with me - Call with any issues or questions  Siedah Sedor L. Nazire Fruth, PharmD, BCIDP, AAHIVP, CPP Clinical Pharmacist Practitioner - Infectious Diseases Clinical Pharmacist Lead - Specialty Pharmacy [facility]     [1]  Allergies Allergen Reactions   Acetaminophen  Other (See Comments)    Inflamed liver, hospitalized  Other Reaction(s): liver demage   Morphine  Sulfate Hives and Shortness Of Breath   Triamterene Hives   Aspirin -Caffeine Diarrhea and Other (See Comments)    liver damage; upset stomach  Other Reaction(s): liver demage   Dyazide [Hydrochlorothiazide -Triamterene] Hives   Empagliflozin Diarrhea    (Jardiance) stomach ache   Metformin  Hcl Er Other (See Comments) and Nausea Only    upset stomach   Topamax  [Topiramate ] Other (See Comments)    Vision disturbances.   Whole Blood     Jehova's Witness    Citalopram  Itching, Rash and Other (See Comments)   Emtricitabine -Tenofovir  Df Rash    Descovy    Lisinopril  Other (See Comments)    Cough    Losartan  Nausea Only and Rash    Pt had rash, worsening dizziness, and nausea after starting losartan , which improved after stopping losartan    Triamterene-Hctz Rash   "

## 2024-04-29 ENCOUNTER — Ambulatory Visit (INDEPENDENT_AMBULATORY_CARE_PROVIDER_SITE_OTHER): Admitting: Pharmacist

## 2024-04-29 ENCOUNTER — Other Ambulatory Visit: Payer: Self-pay

## 2024-04-29 DIAGNOSIS — B20 Human immunodeficiency virus [HIV] disease: Secondary | ICD-10-CM

## 2024-04-29 MED ORDER — CABOTEGRAVIR & RILPIVIRINE ER 600 & 900 MG/3ML IM SUER
1.0000 | Freq: Once | INTRAMUSCULAR | Status: AC
  Administered 2024-04-29: 1 via INTRAMUSCULAR

## 2024-05-01 ENCOUNTER — Other Ambulatory Visit (HOSPITAL_COMMUNITY): Payer: Self-pay

## 2024-05-01 NOTE — Progress Notes (Signed)
 Specialty Pharmacy Refill Coordination Note  [name] [name] is a 64 y.o. female assessed today regarding refills of clinic administered specialty medication(s) Tezepelumab -ekko (TEZSPIRE )   Clinic requested Courier to Provider Office   Delivery date: 05/09/24   Verified address: [facility] [place] [address]   Medication will be filled on: 05/08/24

## 2024-05-03 ENCOUNTER — Other Ambulatory Visit: Payer: Self-pay | Admitting: Student

## 2024-05-03 NOTE — Telephone Encounter (Unsigned)
 Copied from CRM #[id number]. Topic: Clinical - Medication Refill >> May 03, 2024  1:07 PM DeAngela L wrote: Medication: chlorthalidone  (HYGROTON ) 25 MG tablet   Has the patient contacted their pharmacy? Yes  (Agent: If no, request that the patient contact the pharmacy for the refill. If patient does not wish to contact the pharmacy document the reason why and proceed with request.) (Agent: If yes, when and what did the pharmacy advise?)  This is the patient's preferred pharmacy:  [facility] - [place], [place] - [address] [facility] [address] [place] [address] Phone: [phone] Fax: [phone] Is this the correct pharmacy for this prescription? Yes  If no, delete pharmacy and type the correct one.   Has the prescription been filled recently? Yes  Is the patient out of the medication? Yes  Has the patient been seen for an appointment in the last year OR does the patient have an upcoming appointment? No   Can we respond through MyChart? Yes   Agent: Please be advised that Rx refills may take up to 3 business days. We ask that you follow-up with your pharmacy.

## 2024-05-06 MED ORDER — CHLORTHALIDONE 25 MG PO TABS: 25.0000 mg | ORAL_TABLET | Freq: Every day | ORAL | 1 refills | Status: AC

## 2024-05-08 ENCOUNTER — Other Ambulatory Visit: Payer: Self-pay

## 2024-05-09 ENCOUNTER — Other Ambulatory Visit: Payer: Self-pay

## 2024-05-09 ENCOUNTER — Telehealth: Payer: Self-pay

## 2024-05-09 NOTE — Patient Outreach (Signed)
 Complex Care Management   Visit Note  05/09/2024  Name:  [name] [name] MRN: [id number] DOB: 1961/03/25  Situation: Referral received for Complex Care Management related to Hypertension and Diabetes. I obtained verbal consent from Patient.  Visit completed with Patient  on the phone  Background:   Past Medical History:  Diagnosis Date   Addison's disease (HCC)    Allergic rhinitis 05/09/2006   Allergy     Anxiety    Arthritis    Asthma    Blood dyscrasia    HIV   CAD (coronary artery disease) 03/19/2023   CCTA 03/16/23: CAC score 471 (98th percentile), RCA diffuse 25-49, dLM minimal Ca2+ plaque, LAD prox minimal plaques, pLCx < 24; TPV 542 mm3 (86th percentile)    CHF (congestive heart failure) (HCC)    Chronic back pain    Chronic night sweats 08/23/2022   Depression    Diabetes mellitus without complication (HCC) 04/25/2014   GERD (gastroesophageal reflux disease)    Heart murmur    as a child   Hepatitis C    genotype 1b, 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 s/p 12 week course of simeprevir and sofosbuvir between October 2014 and January 2015 with resolution.   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   Urticaria    Vaginal atrophy 10/20/2022    Assessment: Patient Reported Symptoms:  Cognitive Cognitive Status: No symptoms reported, Alert and oriented to person, place, and time, Normal speech and language skills Cognitive/Intellectual Conditions Management [RPT]: None reported or documented in medical history or problem list   Health Maintenance Behaviors: Annual physical exam, Healthy  diet, Stress management Healing Pattern: Average Health Facilitated by: Healthy diet, Rest, Stress management  Neurological Neurological Review of Symptoms: No symptoms reported (has medication for cluster headaches but states she has not had one in about a year.) Neurological Management Strategies: Medication therapy, Routine screening Neurological Self-Management Outcome: 4 (good)  HEENT HEENT Symptoms Reported: Ear pain (Was having ear pain in right ear- went to ENT today 05/09/24 and was prescribed ABT drops.) HEENT Management Strategies: Routine screening, Medication therapy HEENT Self-Management Outcome: 3 (uncertain) HEENT Comment: Uses hearing aides.    Cardiovascular Cardiovascular Symptoms Reported: No symptoms reported Other Cardiovascular Symptoms: Patient reports checking her BP daily- Last BP check reported today 136/72 Does patient have uncontrolled Hypertension?: Yes Is patient checking Blood Pressure at home?: Yes Patient's Recent BP reading at home: 136/72. Patient checking daily and keeping a log. Cardiovascular Management Strategies: Coping strategies, Adequate rest, Medication therapy, Routine screening Weight: 168 lb (76.2 kg) Cardiovascular Self-Management Outcome: 4 (good)  Respiratory Respiratory Symptoms Reported: No symptoms reported Other Respiratory Symptoms: Patient reports that she gets SOB with exertion. Additional Respiratory Details: Uses Symbicort - reports she has not had to use her rescue inhaler. Respiratory Management Strategies: Routine screening, Adequate rest, Coping strategies, Medication therapy Respiratory Self-Management Outcome: 4 (good)  Endocrine Endocrine Symptoms Reported: No symptoms reported Is patient diabetic?: Yes Is patient checking blood sugars at home?: Yes List most recent blood sugar readings, include date and time  of day: Patient has been checking her BS at home. Meter recently broke and is awating a new meter. BS was checked  today and was 147. Providers office is sending a new meter out, Endocrine Self-Management Outcome: 4 (good)  Gastrointestinal Gastrointestinal Symptoms Reported: No symptoms reported Additional Gastrointestinal Details: Uses metamucil- reports last BM was today and everything has been normal. Gastrointestinal Management Strategies: Medication therapy, Adequate rest Gastrointestinal Self-Management Outcome: 4 (good)    Genitourinary Genitourinary Symptoms Reported: Frequency Additional Genitourinary Details: Reports frequent urination d/t being on a fluid pill. Genitourinary Management Strategies: Medication therapy, Adequate rest Genitourinary Self-Management Outcome: 4 (good)  Integumentary Integumentary Symptoms Reported: No symptoms reported Skin Management Strategies: Routine screening Skin Self-Management Outcome: 4 (good)  Musculoskeletal Musculoskelatal Symptoms Reviewed: Back pain, Joint pain Additional Musculoskeletal Details: Chronic back pain. Has had hx of shoulder replacement and still has some weakness and pain at times in the shoulder. Musculoskeletal Management Strategies: Medication therapy, Routine screening, Adequate rest, Coping strategies Musculoskeletal Self-Management Outcome: 4 (good) Falls in the past year?: No Number of falls in past year: 1 or less Was there an injury with Fall?: No Fall Risk Category Calculator: 0 Patient Fall Risk Level: Low Fall Risk    Psychosocial Psychosocial Symptoms Reported: Alteration in eating habits, No symptoms reported Behavioral Management Strategies: Coping strategies, Support system, Adequate rest, Medication therapy Behavioral Health Self-Management Outcome: 4 (good) Major Change/Loss/Stressor/Fears (CP): Denies Techniques to Cope with Loss/Stress/Change: Medication Quality of Family Relationships: helpful, supportive, involved Do you feel physically threatened by others?: No    05/09/2024    PHQ2-9 Depression Screening    Little interest or pleasure in doing things Not at all  Feeling down, depressed, or hopeless Not at all  PHQ-2 - Total Score 0  Trouble falling or staying asleep, or sleeping too much    Feeling tired or having little energy    Poor appetite or overeating     Feeling bad about yourself - or that you are a failure or have let yourself or your family down    Trouble concentrating on things, such as reading the newspaper or watching television    Moving or speaking so slowly that other people could have noticed.  Or the opposite - being so fidgety or restless that you have been moving around a lot more than usual    Thoughts that you would be better off dead, or hurting yourself in some way    PHQ2-9 Total Score    If you checked off any problems, how difficult have these problems made it for you to do your work, take care of things at home, or get along with other people    Depression Interventions/Treatment      Today's Vitals   05/09/24 1442  BP: 136/72  Weight:    Pain Scale: 0-10 Pain Score: 7  Pain Type: Chronic pain Pain Location: Back (Sees pain management and receives lumbar injections every 3 months.) Pain Orientation: Lower Pain Onset: On-going Patients Stated Pain Goal: 0 Pain Intervention(s): Medication (See eMAR), Rest, Massage, Hot/Cold interventions Multiple Pain Sites: No  Medications Reviewed Today     Reviewed by Leodis Warren DEL, RN (Registered Nurse) on 05/09/24 at 1431  Med List Status: <None>   Medication Order Taking? Sig Documenting Provider Last Dose Status Informant  ACCU-CHEK GUIDE test strip [id number] Yes use to check blood sugar In Vitro Once a day DX: E11.9 for 90 days [provider]  Active Self  amLODipine  (NORVASC ) 10 MG  tablet [id number] Yes Take 1 tablet (10 mg total) by mouth daily. Gomez-Caraballo, Maria, MD  Active Self  aspirin  EC 81 MG tablet [id number] Yes Take 1 tablet (81 mg total) by mouth daily. Swallow whole. Addie Cordella Hamilton, MD  Active   atorvastatin  (LIPITOR) 10 MG tablet [id number] Yes Take 0.5 tablets (5 mg total) by mouth daily. Gomez-Caraballo, Maria, MD  Active Self  betamethasone  valerate ointment (VALISONE ) 0.1 % [id number] Yes Apply 1 Application topically 2 (two) times daily.  Patient taking differently: Apply 1 Application topically 2 (two) times daily. Patient using as needed   Melvenia Corean SAILOR, NP  Active Self  cabotegravir  & rilpivirine  ER (CABENUVA ) 600 & 900 MG/3ML injection [id number] Yes Inject 1 kit into the muscle every 2 (two) months. Waddell Alan PARAS, RPH-CPP  Active   cetirizine  (ZYRTEC ) 10 MG tablet [id number] Yes Take 2 tablets (20 mg total) by mouth daily as needed for allergies. Cari Arlean HERO, FNP  Active   chlorthalidone  (HYGROTON ) 25 MG tablet [id number] Yes Take 1 tablet (25 mg total) by mouth daily. Gomez-Caraballo, Maria, MD  Active   cholecalciferol  (VITAMIN D3) 25 MCG (1000 UNIT) tablet [id number] Yes Take 3 tablets (3,000 Units total) by mouth daily. Nooruddin, Saad, MD  Active   ciprofloxacin -dexamethasone  (CIPRODEX ) OTIC suspension [id number] Yes Place 4 drops into the left ear as needed (ear infection). [provider]  Active Self  diazepam  (VALIUM ) 5 MG tablet [id number] Yes Take 1 by mouth 1 hour  pre-procedure with very light food. Do not drive motor vehicle. Eldonna Novel, MD  Active   docusate sodium  (COLACE) 100 MG capsule [id number] Yes Take 1 capsule (100 mg total) by mouth 2 (two) times daily. Addie Cordella Hamilton, MD  Active   ezetimibe  (ZETIA ) 10 MG tablet [id number] Yes Take 1 tablet (10 mg total) by mouth daily. Gomez-Caraballo, Maria, MD  Active Self  famotidine  (PEPCID ) 40 MG tablet [id number] Yes Take 1 tablet (40 mg total) by mouth at bedtime. Gomez-Caraballo, Maria, MD  Active Self  Fezolinetant  (VEOZAH ) 45 MG TABS [id number] Yes Take 1 tablet (45 mg total) by mouth daily. Melvenia Corean SAILOR, NP  Active   gabapentin  (NEURONTIN ) 400 MG capsule [id number] Yes  TAKE 1 CAPSULE (400 MG TOTAL) BY MOUTH AT BEDTIME Ambs, Arlean HERO, FNP  Active   GLOBAL EASE INJECT PEN NEEDLES 32G X 4 MM MISC [id number] Yes USE AS DIRECTED WITH VICTOZA for 30 [provider]  Active Self  glucose blood test strip [id number] Yes Use as instructed Tobie Gaines, DO  Active Self  hydrocortisone  (CORTEF ) 5 MG tablet [id number] Yes Take 2.5-5 mg by mouth See admin instructions. Take 1 tablet (5 mg) by mouth in the morning (scheduled) & may taken an additional 0.5 tablet (2.5 mg) by mouth in the evening if needed. [provider]  Active Self           Med Note STEPHEN OLIVIA Kitchens Feb 27, 2023  3:23 PM)    ibuprofen  (ADVIL ) 800 MG tablet [id number] Yes Take 1 tablet (800 mg total) by mouth every 8 (eight) hours as needed. Magnant, Carlin CROME, PA-C  Active   liraglutide (VICTOZA) 18 MG/3ML SOPN [id number] Yes Inject 1.8 mg into the skin every evening. (1700) [provider]  Active Self  methocarbamol  (ROBAXIN ) 500 MG tablet [id number] Yes Take 1 tablet (500 mg total) by mouth every 6 (six) hours as needed for muscle spasms. Magnant, Carlin CROME, PA-C  Active   metoprolol  tartrate (  LOPRESSOR ) 50 MG tablet [id number] Yes Take 1 tablet (50 mg total) by mouth 2 (two) times daily. Gomez-Caraballo, Maria, MD  Active Self  montelukast  (SINGULAIR ) 10 MG tablet [id number] Yes TAKE 1 TABLET (10 MG TOTAL) BY MOUTH AT BEDTIME. Kozlow, Eric J, MD  Active   NEURONTIN  100 MG capsule [id number] Yes TAKE TWO CAPSULES (200 MG TOTAL) BY MOUTH TWO (TWO) TIMES DAILY. TAKE 1-2 CAPSULES EVERY 3-4 TIMES DAILY. Kozlow, Camellia PARAS, MD  Active   nitroGLYCERIN  (NITROSTAT ) 0.4 MG SL tablet [id number] Yes Place 1 tablet (0.4 mg total) under the tongue every 5 (five) minutes as needed for chest pain. Lou Claretta HERO, MD  Active Self  nortriptyline  (PAMELOR ) 10 MG capsule [id number] Yes TAKE TWO CAPSULES BY MOUTH AT BEDTIME Onita Duos, MD  Active Self  ondansetron  (ZOFRAN ) 4 MG tablet [id number] Yes Take 1  tablet (4 mg total) by mouth every 8 (eight) hours as needed for nausea or vomiting. King, Olivia, DO  Active   pantoprazole  (PROTONIX ) 40 MG tablet [id number] Yes Take 1 tablet (40 mg total) by mouth 2 (two) times daily. Elnora Ip, MD  Active Self  potassium chloride  20 MEQ/15ML (10%) SOLN [id number] Yes Take 30 mLs (40 mEq total) by mouth 2 (two) times daily. Melvenia Corean SAILOR, NP  Active   psyllium (HYDROCIL/METAMUCIL) 95 % PACK [id number] Yes Take 1 packet by mouth daily. [provider]  Active Self  sertraline  (ZOLOFT ) 100 MG tablet [id number] Yes Take 1 tablet (100 mg total) by mouth daily. Gomez-Caraballo, Maria, MD  Active Self  sodium chloride  (OCEAN) 0.65 % SOLN nasal spray [id number] Yes PLACE 1 SPRAY INTO BOTH NOSTRILS AS NEEDED FOR CONGESTION. Rosan Harlene Fickle, DO  Active Self  SUMAtriptan  (IMITREX ) 25 MG tablet [id number] Yes Take 1 tablet (25 mg total) by mouth every 2 (two) hours as needed for migraine. May repeat in 2 hours if headache persists or recurs. Onita Duos, MD  Active Self  SYMBICORT  160-4.5 MCG/ACT inhaler [id number] Yes INHALE TWO PUFFS INTO THE LUNGS IN THE MORNING AND AT BEDTIME. Kozlow, Camellia PARAS, MD  Active   Tezepelumab -ekko (TEZSPIRE ) 210 MG/1. SOAJ [id number] Yes Inject 210 mg into the skin every 28 (twenty-eight) days. Kozlow, Eric J, MD  Active Self  tezepelumab -ekko (TEZSPIRE ) 210 MG/1. syringe 210 mg [id number]   Kozlow, Eric J, MD  Active   traMADol  (ULTRAM ) 50 MG tablet [id number]  Take 1 tablet (50 mg total) by mouth every 6 (six) hours as needed.  Patient not taking: Reported on 05/09/2024   Shirly Carlin CROME, PA-C  Active   triamcinolone  (NASACORT ) 55 MCG/ACT AERO nasal inhaler [id number] Yes Place 1 spray into the nose 2 (two) times daily. 1 spray each nostril 2 times per day Maurilio Camellia PARAS, MD  Active Self  VENTOLIN  HFA 108 (90 Base) MCG/ACT inhaler [id number] Yes INHALE TWO PUFFS INTO THE LUNGS EVERY 4 (FOUR) HOURS AS NEEDED FOR  WHEEZING OR SHORTNESS OF BREATH. Kozlow, Camellia PARAS, MD  Active Self            Recommendation:   Continue Current Plan of Care  Follow Up Plan:   Telephone follow up appointment date/time:  06/07/24 at 11 am  Warren Quivers RN CM Population Health-Complex Care Management [facility] [phone]

## 2024-05-09 NOTE — Patient Instructions (Signed)
 Visit Information  [name] [name] was given information about Medicaid Managed Care team care coordination services as a part of their [facility] Medicaid benefit.   If you would like to schedule transportation through your [facility], please call the following number at least 2 days in advance of your appointment: [phone]   Rides for urgent appointments can also be made after hours by calling Member Services.  Call the Behavioral Health Crisis Line at [phone], at any time, 24 hours a day, 7 days a week. If you are in danger or need immediate medical attention call 911.  Please see education materials related to HTN and DM provided by MyChart link.  Care plan and visit instructions communicated with the patient verbally today. Patient agrees to receive a copy in MyChart. Active MyChart status and patient understanding of how to access instructions and care plan via MyChart confirmed with patient.     The Managed Medicaid care management team will reach out to the patient again over the next 30 days.  Telephone follow up appointment with Managed Medicaid care management team member scheduled for:06/07/24 at 11 am.  Warren Quivers RN CM Population Health-Complex Care [facility] [phone]    Following is a copy of your plan of care:   Goals Addressed             This Visit's Progress    VBCI RN Care Plan-DM   On track    Problems:  Chronic Disease Management support and education needs related to Diabetes management.  Goal: Over the next 90 days the Patient will attend all scheduled medical appointments: with providers and specialists pertaining to health maintenance as evidenced by patient report and/or chart review.         demonstrate Ongoing adherence to prescribed treatment plan for diabetes as evidenced by patient maintaining BS within normal range.  demonstrate Ongoing health management independence  as evidenced by Management of BS levels and reduction in overall A1C.        take all medications exactly as prescribed and will call provider for medication related questions as evidenced by patient report and/or chart review.     verbalize basic understanding of diabetes disease process and self health management plan as evidenced by patient continuing to check BS and keep a log, Monitor carb intake and report any out of range readings to provider.   Interventions:   Diabetes Interventions: Assessed patient's understanding of A1c goal: <6.5% Provided education to patient about basic DM disease process Reviewed medications with patient and discussed importance of medication adherence Counseled on importance of regular laboratory monitoring as prescribed Discussed plans with patient for ongoing care management follow up and provided patient with direct contact information for care management team Provided patient with written educational materials related to hypo and hyperglycemia and importance of correct treatment Screening for signs and symptoms of depression related to chronic disease state  Assessed social determinant of health barriers Lab Results  Component Value Date   HGBA1C 6.6 02/09/2024    Patient Self-Care Activities:  check feet daily for cuts, sores or redness enter blood sugar readings and medication or insulin  into daily log take the blood sugar log to all doctor visits set goal weight trim toenails straight across drink 6 to 8 glasses of water  each day fill half of plate with vegetables limit fast food meals to no more than 1 per week set a realistic goal keep feet up while  sitting  Plan:  Telephone follow up appointment with care management team member scheduled for:  06/07/24 at 11 am The care management team will reach out to the patient again over the next 30 days.          VBCI RN Care Plan-HTN   On track    Problems:  Chronic Disease Management support  and education needs related to HTN  Goal: Over the next 90  days the Patient will attend all scheduled medical appointments: with PCP and specialist as evidenced by keeping al scheduled appointments        demonstrate Ongoing adherence to prescribed treatment plan for HTN as evidenced by no admissions to the hospital verbalize basic understanding of HTN disease process and self health management plan as evidenced by verbal explanation lifestyle changes and consistent medication compliance Continue to check BP daily and keep a log. Report any changes to PCP.   Interventions:   Hypertension Interventions: Last practice recorded BP readings:  BP Readings from Last 3 Encounters:  05/09/24 136/72  04/22/24 136/83  04/09/24 128/83  Pt reports her BP is 136/72 and she remains asymptomatic with no reported issues. Patient reports checking daily and keeping a log. Most recent eGFR/CrCl:  Lab Results  Component Value Date   EGFR 70 04/22/2024    No components found for: CRCL  Evaluation of current treatment plan related to hypertension self management and patient's adherence to plan as established by provider Reviewed medications with patient and discussed importance of compliance Reiterate on the discussed plans with patient for ongoing care management follow up and provided patient with direct contact information for care management team Advised patient, providing education and rationale, to monitor blood pressure daily and record, calling PCP for findings outside established parameters if necessary.  Patient able to recite her parameters 130/80 and aware of symptoms related to HTN and what to do if acute. Reviewed scheduled/upcoming provider appointments including: noted in EPIC as patient able to review via her MyChart Continue to explore pt's awareness on complications of poorly controlled blood pressure such as heart disease, stroke, circulatory complications, vision complications, kidney  impairment, sexual dysfunction Assessed social determinant of health barrier Continue to check BP weekly as reported by patient and write the values for providers to view- Current reading today 136/72.   Patient Self-Care Activities:  Attend all scheduled provider appointments Call pharmacy for medication refills 3-7 days in advance of running out of medications Call provider office for new concerns or questions  Take medications as prescribed   check blood pressure daily write blood pressure results in a log or diary take blood pressure log to all doctor appointments call doctor for signs and symptoms of high blood pressure keep all doctor appointments take medications for blood pressure exactly as prescribed report new symptoms to your doctor limit salt intake to 1500 mg daily and consume healthy food (fresh/frozen)  Plan:  Telephone follow up appointment with care management team member scheduled for:  06/07/24 at 11 am

## 2024-05-14 ENCOUNTER — Ambulatory Visit

## 2024-05-14 DIAGNOSIS — J4550 Severe persistent asthma, uncomplicated: Secondary | ICD-10-CM

## 2024-05-15 ENCOUNTER — Ambulatory Visit: Admitting: Neurology

## 2024-05-15 DIAGNOSIS — G478 Other sleep disorders: Secondary | ICD-10-CM

## 2024-05-15 DIAGNOSIS — G4733 Obstructive sleep apnea (adult) (pediatric): Secondary | ICD-10-CM

## 2024-05-15 DIAGNOSIS — Z9189 Other specified personal risk factors, not elsewhere classified: Secondary | ICD-10-CM

## 2024-05-15 DIAGNOSIS — J0120 Acute ethmoidal sinusitis, unspecified: Secondary | ICD-10-CM

## 2024-05-15 DIAGNOSIS — D352 Benign neoplasm of pituitary gland: Secondary | ICD-10-CM

## 2024-05-15 DIAGNOSIS — E2749 Other adrenocortical insufficiency: Secondary | ICD-10-CM

## 2024-05-15 DIAGNOSIS — E271 Primary adrenocortical insufficiency: Secondary | ICD-10-CM

## 2024-05-15 DIAGNOSIS — F19982 Other psychoactive substance use, unspecified with psychoactive substance-induced sleep disorder: Secondary | ICD-10-CM

## 2024-05-17 NOTE — Progress Notes (Signed)
 "       [facility]  AREANA [name] 64 year old female [birth year]   HOME SLEEP TEST REPORT ( by Watch PAT)   STUDY DATE:   05-15-2024   Downloaded 05-17-2024     ORDERING CLINICIAN:  Dedra Gores, MD  REFERRING CLINICIAN:  Janine Fallow, MD   PCP; Elnora, MD    CLINICAL INFORMATION/HISTORY:  This 64 year old female patient presented on 03-28-24  with reported non- restorative sleep, Insomnia, Addison disease, DM, depression, HTN, anxiety, asthma, and history of pituitary adenoma.  The patient reported continued difficulty with sleep, both initiation and remaining asleep. Her sleep hygiene is adequate with the patient reporting no screens and going to bed and waking up around the same time each night. She has attempted to take melatonin, but it has never helped her. She has never tried any other sleep aids. She describes the sensation of waking up multiple times per night and not feeling well rested after sleep. She is on chronic steroids, which she much take later in the day which I suspect are contributing. With her body habitus and description of symptoms, suspect she may have OSA. She has never had a polysomnography. Discussed this with the patient and will refer to sleep medicine for further evaluation before initiating pharmaceutical sleep aids.     Epworth sleepiness score: 0/ 24 points.    FSS endorsed at 44/ 63 points.  BMI:  32 kg/m  Neck Circumference:  15     Sleep Summary:   Total Recording Time (hours, min):  6 h 58 minutes       Total Sleep Time (hours, min):   6 h 2 minutes              Percent REM (%):   27.4%                                       Respiratory Indices ( AHI ) :   Calculated pAHI (per hour):  22.7/h                           REM pAHI:    50/h                                              NREM pAHI:   12.8/h                          The supine AHI was 11.6/h and non-supine AHI was 25/h , with the highest AHI while  sleeping on the left side (!)      Snoring was mild - at 41 dB mean volume.                                               Oxygen Saturation Statistics:   Oxygen Saturation (%) Mean:   87%              O2 Saturation Range (%):    78 through 97%  O2 Saturation (minutes) <89%:   20.5 minutes         Pulse Rate Statistics:   Pulse Mean (bpm):    77             Pulse Range:   between 60 and 98 bpm.   Caveat: the watch pat device does not provide data of cardiac rhythm.              IMPRESSION:  This HST confirms the presence of  moderate obstructive sleep apnea , REM sleep is exacerbating the AHI significantly. There is clinically significant  hypoxia present  which occurs in REM sleep.  Unusual was the distribution of apneas between supine and non-supine sleep.      RECOMMENDATION: REM sleep apnea is frequently related to obesity and /or low core strength.  Weight loss is recommended, core strength exercises are recommended.   CPAP is recommended, as only positive airway pressure therapy can improve hypoxia and apnea. My DME prescription will be for :  CPAP auto-titration device  , ResMed device  with a setting from 6 through 16 cm water , 3 cm EPR and mask of choice to be fitted.       Any CPAP patient should be reminded to be fully compliant with PAP therapy , (defined as using PAP therapy for more than 4 hours each night ) with the goal to improve sleep related symptoms and decrease long term cardiovascular risks. Any PAP therapy patient should be reminded, that it may take up to 3 months to get fully used to using PAP and it may take 1-2 weeks for an established CPAP user to acclimatize to changes in pressure or mask. The earlier full compliance is achieved, the better long term compliance tends to be.   Please note that untreated obstructive sleep apnea may carry additional perioperative morbidity. Patients with significant obstructive sleep apnea  should receive perioperative PAP therapy and the surgical team should be informed of the diagnosis and degree of sleep disordered breathing.  Sleep fragmentation in the presence of normal proportional sleep stages is a nonspecific findings and per se does not signify an intrinsic sleep disorder or a cause for the patient's sleep-related symptoms.  Causes include (but are not limited to) the unfamiliarity of sleeping while recorded by HST device or sleeping in a sleep lab for a full Polysomnography sleep study, but also circadian rhythm disturbances, medication side effects or an underlying mood disorder or medical problem.     INTERPRETING PHYSICIAN:   Dedra Gores, MD                 "

## 2024-05-21 ENCOUNTER — Other Ambulatory Visit: Payer: Self-pay | Admitting: Infectious Diseases

## 2024-05-21 NOTE — Telephone Encounter (Signed)
 Okay to refill.

## 2024-05-22 ENCOUNTER — Ambulatory Visit: Payer: Self-pay | Admitting: Neurology

## 2024-05-22 DIAGNOSIS — G4733 Obstructive sleep apnea (adult) (pediatric): Secondary | ICD-10-CM | POA: Insufficient documentation

## 2024-05-22 NOTE — Procedures (Signed)
 "       [facility]  [name] [name] 64 year old female 02-Oct-1960   HOME SLEEP TEST REPORT ( by Watch PAT)   STUDY DATE:   05-15-2024   Downloaded 05-17-2024     ORDERING CLINICIAN:  Dedra Gores, MD  REFERRING CLINICIAN:  Janine Fallow, MD   PCP; Elnora, MD    CLINICAL INFORMATION/HISTORY:  This 64 year old female patient presented on 03-28-24  with reported non- restorative sleep, Insomnia, Addison disease, DM, depression, HTN, anxiety, asthma, and history of pituitary adenoma.  The patient reported continued difficulty with sleep, both initiation and remaining asleep. Her sleep hygiene is adequate with the patient reporting no screens and going to bed and waking up around the same time each night. She has attempted to take melatonin, but it has never helped her. She has never tried any other sleep aids. She describes the sensation of waking up multiple times per night and not feeling well rested after sleep. She is on chronic steroids, which she much take later in the day which I suspect are contributing. With her body habitus and description of symptoms, suspect she may have OSA. She has never had a polysomnography. Discussed this with the patient and will refer to sleep medicine for further evaluation before initiating pharmaceutical sleep aids.     Epworth sleepiness score: 0/ 24 points.    FSS endorsed at 44/ 63 points.  BMI:  32 kg/m  Neck Circumference:  15     Sleep Summary:   Total Recording Time (hours, min):  6 h 58 minutes       Total Sleep Time (hours, min):   6 h 2 minutes              Percent REM (%):   27.4%                                       Respiratory Indices ( AHI ) :   Calculated pAHI (per hour):  22.7/h                           REM pAHI:    50/h                                              NREM pAHI:   12.8/h                          The supine AHI was 11.6/h and non-supine AHI was 25/h , with the highest AHI while  sleeping on the left side (!)      Snoring was mild - at 41 dB mean volume.                                               Oxygen Saturation Statistics:   Oxygen Saturation (%) Mean:   87%              O2 Saturation Range (%):    78 through 97%  O2 Saturation (minutes) <89%:   20.5 minutes         Pulse Rate Statistics:   Pulse Mean (bpm):    77             Pulse Range:   between 60 and 98 bpm.   Caveat: the watch pat device does not provide data of cardiac rhythm.              IMPRESSION:  This HST confirms the presence of  moderate obstructive sleep apnea , REM sleep is exacerbating the AHI significantly. There is clinically significant  hypoxia present  which occurs in REM sleep.  Unusual was the distribution of apneas between supine and non-supine sleep.      RECOMMENDATION: REM sleep apnea is frequently related to obesity and /or low core strength.  Weight loss is recommended, core strength exercises are recommended.   CPAP is recommended, as only positive airway pressure therapy can improve hypoxia and apnea. My DME prescription will be for :  CPAP auto-titration device  , ResMed device  with a setting from 6 through 16 cm water , 3 cm EPR and mask of choice to be fitted.       Any CPAP patient should be reminded to be fully compliant with PAP therapy , (defined as using PAP therapy for more than 4 hours each night ) with the goal to improve sleep related symptoms and decrease long term cardiovascular risks. Any PAP therapy patient should be reminded, that it may take up to 3 months to get fully used to using PAP and it may take 1-2 weeks for an established CPAP user to acclimatize to changes in pressure or mask. The earlier full compliance is achieved, the better long term compliance tends to be.   Please note that untreated obstructive sleep apnea may carry additional perioperative morbidity. Patients with significant obstructive sleep apnea  should receive perioperative PAP therapy and the surgical team should be informed of the diagnosis and degree of sleep disordered breathing.  Sleep fragmentation in the presence of normal proportional sleep stages is a nonspecific findings and per se does not signify an intrinsic sleep disorder or a cause for the patient's sleep-related symptoms.  Causes include (but are not limited to) the unfamiliarity of sleeping while recorded by HST device or sleeping in a sleep lab for a full Polysomnography sleep study, but also circadian rhythm disturbances, medication side effects or an underlying mood disorder or medical problem.     INTERPRETING PHYSICIAN:   Dedra Gores, MD                "

## 2024-05-28 ENCOUNTER — Encounter (INDEPENDENT_AMBULATORY_CARE_PROVIDER_SITE_OTHER): Payer: Self-pay

## 2024-05-28 ENCOUNTER — Ambulatory Visit: Admitting: Plastic Surgery

## 2024-05-28 ENCOUNTER — Other Ambulatory Visit (HOSPITAL_COMMUNITY)
Admission: RE | Admit: 2024-05-28 | Discharge: 2024-05-28 | Disposition: A | Source: Ambulatory Visit | Attending: Plastic Surgery | Admitting: Plastic Surgery

## 2024-05-28 VITALS — BP 149/87 | HR 83 | Temp 98.1°F

## 2024-05-28 DIAGNOSIS — L989 Disorder of the skin and subcutaneous tissue, unspecified: Secondary | ICD-10-CM

## 2024-05-28 DIAGNOSIS — R2231 Localized swelling, mass and lump, right upper limb: Secondary | ICD-10-CM | POA: Insufficient documentation

## 2024-05-28 DIAGNOSIS — L910 Hypertrophic scar: Secondary | ICD-10-CM | POA: Diagnosis not present

## 2024-05-29 ENCOUNTER — Other Ambulatory Visit: Payer: Self-pay

## 2024-05-29 ENCOUNTER — Other Ambulatory Visit: Payer: Self-pay | Admitting: Pharmacy Technician

## 2024-05-29 NOTE — Telephone Encounter (Signed)
 Pt called returning call , Informed that she will get a call back

## 2024-05-29 NOTE — Telephone Encounter (Signed)
 Spoke to patient about sleep study results. Patient agreeable to start CPAP therapy. Patient sent to [facility] as DME. Patient was scheduled for initial CPAP appointment as well.

## 2024-05-29 NOTE — Progress Notes (Signed)
 Left message for patient to return my call.

## 2024-05-29 NOTE — Telephone Encounter (Signed)
-----   Message from Dedra Gores, MD sent at 05/22/2024  5:40 PM EST ----- Yes, this OSA is moderate severe and associated with low oxygen saturation-  REM sleep dependent. The patient should start on CPAP.   I spelled out which pressure settings would be needed and she can follow up in 30-90 days with NP or me.

## 2024-05-29 NOTE — Progress Notes (Signed)
 Specialty Pharmacy Refill Coordination Note  [name] [name] is a 64 y.o. female assessed today regarding refills of clinic administered specialty medication(s) Tezepelumab -ekko (TEZSPIRE )   Clinic requested Courier to Provider Office   Delivery date: 06/04/24   Verified address: [facility] [place] [address]   Medication will be filled on: 06/03/24

## 2024-05-29 NOTE — Progress Notes (Signed)
 SABRA

## 2024-05-30 ENCOUNTER — Telehealth: Payer: Self-pay

## 2024-05-30 DIAGNOSIS — Z5982 Transportation insecurity: Secondary | ICD-10-CM

## 2024-05-30 LAB — SURGICAL PATHOLOGY

## 2024-05-31 ENCOUNTER — Telehealth: Payer: Self-pay

## 2024-05-31 NOTE — Progress Notes (Signed)
 Complex Care Management Note Care Guide Note  05/31/2024 Name: [name] [name] MRN: [id number] DOB: 1960-07-12  [name] is a 64 y.o. year old female who is a primary care patient of Elnora Ip, MD . The community resource team was consulted for assistance with Transportation Needs   SDOH screenings and interventions completed:  Yes  Social Drivers of Health From This Encounter   Food Insecurity: No Food Insecurity (05/31/2024)   Epic    Worried About Running Out of Food in the Last Year: Never true    Ran Out of Food in the Last Year: Never true  Housing: Low Risk (05/31/2024)   Epic    Unable to Pay for Housing in the Last Year: No    Number of Times Moved in the Last Year: 0    Homeless in the Last Year: No  Financial Resource Strain: Low Risk (05/31/2024)   Overall Financial Resource Strain (CARDIA)    Difficulty of Paying Living Expenses: Not very hard  Transportation Needs: No Transportation Needs (05/31/2024)   Epic    Lack of Transportation (Medical): No    Lack of Transportation (Non-Medical): No  Utilities: Not At Risk (05/31/2024)   Epic    Threatened with loss of utilities: No    SDOH Interventions Today    Flowsheet Row Most Recent Value  SDOH Interventions   Food Insecurity Interventions Intervention Not Indicated, Other (Comment)  [No intervention needed.]  Housing Interventions Intervention Not Indicated, Other (Comment)  [No intervention needed.]  Transportation Interventions Other (Comment), Payor Benefit  [Patient stated she was able to contact [employer] yesterday and they were able to verify her transportation benefit. She stated she has the contact number and is able to call and schedule transportation unassisted.]  Utilities Interventions Intervention Not Indicated, Other (Comment)  [No intervention needed.]  Financial Strain Interventions Intervention Not Indicated, Other (Comment)  [No intervention needed.]     Care guide performed the  following interventions: Patient provided with information about care guide support team and interviewed to confirm resource needs.  Follow Up Plan:  No further follow up planned at this time. The patient has been provided with needed resources.  Encounter Outcome:  Patient Visit Completed  [facility]  [facility] Guide Direct Dial: [phone]  Fax: [phone] Website: [email or web address]

## 2024-06-04 ENCOUNTER — Ambulatory Visit: Admitting: Plastic Surgery

## 2024-06-07 ENCOUNTER — Telehealth

## 2024-06-11 ENCOUNTER — Ambulatory Visit: Admitting: Plastic Surgery

## 2024-06-11 ENCOUNTER — Ambulatory Visit

## 2024-06-25 ENCOUNTER — Ambulatory Visit: Payer: Self-pay | Admitting: Pharmacist

## 2024-07-09 ENCOUNTER — Encounter: Admitting: Physical Medicine & Rehabilitation

## 2024-09-03 ENCOUNTER — Ambulatory Visit: Admitting: Neurology

## 2024-09-09 ENCOUNTER — Other Ambulatory Visit (HOSPITAL_BASED_OUTPATIENT_CLINIC_OR_DEPARTMENT_OTHER)
